# Patient Record
Sex: Female | Born: 1945 | Race: Black or African American | Hispanic: No | Smoking: Former smoker
Health system: Southern US, Community
[De-identification: ages and names within clinical notes are randomized; demographics above are authoritative.]

## PROBLEM LIST (undated history)

## (undated) DIAGNOSIS — I509 Heart failure, unspecified: Secondary | ICD-10-CM

## (undated) DIAGNOSIS — I739 Peripheral vascular disease, unspecified: Secondary | ICD-10-CM

## (undated) DIAGNOSIS — I5030 Unspecified diastolic (congestive) heart failure: Secondary | ICD-10-CM

## (undated) DIAGNOSIS — I160 Hypertensive urgency: Secondary | ICD-10-CM

## (undated) DIAGNOSIS — I10 Essential (primary) hypertension: Secondary | ICD-10-CM

## (undated) DIAGNOSIS — R569 Unspecified convulsions: Secondary | ICD-10-CM

## (undated) DIAGNOSIS — K219 Gastro-esophageal reflux disease without esophagitis: Secondary | ICD-10-CM

## (undated) DIAGNOSIS — G8929 Other chronic pain: Secondary | ICD-10-CM

## (undated) DIAGNOSIS — F32A Depression, unspecified: Secondary | ICD-10-CM

## (undated) DIAGNOSIS — R079 Chest pain, unspecified: Secondary | ICD-10-CM

## (undated) DIAGNOSIS — E785 Hyperlipidemia, unspecified: Secondary | ICD-10-CM

## (undated) DIAGNOSIS — I25118 Atherosclerotic heart disease of native coronary artery with other forms of angina pectoris: Secondary | ICD-10-CM

## (undated) DIAGNOSIS — I5032 Chronic diastolic (congestive) heart failure: Secondary | ICD-10-CM

## (undated) DIAGNOSIS — I2510 Atherosclerotic heart disease of native coronary artery without angina pectoris: Secondary | ICD-10-CM

## (undated) DIAGNOSIS — E119 Type 2 diabetes mellitus without complications: Secondary | ICD-10-CM

## (undated) DIAGNOSIS — F329 Major depressive disorder, single episode, unspecified: Secondary | ICD-10-CM

## (undated) DIAGNOSIS — R002 Palpitations: Secondary | ICD-10-CM

## (undated) DIAGNOSIS — M1990 Unspecified osteoarthritis, unspecified site: Secondary | ICD-10-CM

## (undated) DIAGNOSIS — I219 Acute myocardial infarction, unspecified: Secondary | ICD-10-CM

## (undated) DIAGNOSIS — I5031 Acute diastolic (congestive) heart failure: Secondary | ICD-10-CM

## (undated) DIAGNOSIS — F419 Anxiety disorder, unspecified: Secondary | ICD-10-CM

## (undated) DIAGNOSIS — J449 Chronic obstructive pulmonary disease, unspecified: Secondary | ICD-10-CM

## (undated) DIAGNOSIS — M549 Dorsalgia, unspecified: Secondary | ICD-10-CM

## (undated) HISTORY — DX: Chronic diastolic (congestive) heart failure: I50.32

## (undated) HISTORY — DX: Palpitations: R00.2

## (undated) HISTORY — DX: Essential (primary) hypertension: I10

## (undated) HISTORY — PX: KNEE SURGERY: SHX244

## (undated) HISTORY — DX: Unspecified convulsions: R56.9

## (undated) HISTORY — PX: FRACTURE SURGERY: SHX138

## (undated) HISTORY — PX: ANKLE SURGERY: SHX546

## (undated) HISTORY — PX: CORONARY ANGIOPLASTY: SHX604

## (undated) HISTORY — DX: Hypertensive urgency: I16.0

## (undated) HISTORY — PX: CATARACT EXTRACTION W/ INTRAOCULAR LENS  IMPLANT, BILATERAL: SHX1307

## (undated) HISTORY — DX: Peripheral vascular disease, unspecified: I73.9

## (undated) HISTORY — PX: ABDOMINAL HYSTERECTOMY: SHX81

## (undated) HISTORY — DX: Atherosclerotic heart disease of native coronary artery with other forms of angina pectoris: I25.118

## (undated) HISTORY — DX: Chest pain, unspecified: R07.9

## (undated) HISTORY — DX: Acute diastolic (congestive) heart failure: I50.31

## (undated) HISTORY — PX: CORONARY ARTERY BYPASS GRAFT: SHX141

## (undated) HISTORY — DX: Hyperlipidemia, unspecified: E78.5

## (undated) HISTORY — PX: JOINT REPLACEMENT: SHX530

## (undated) HISTORY — DX: Acute myocardial infarction, unspecified: I21.9

---

## 2015-04-14 ENCOUNTER — Encounter: Payer: Self-pay | Admitting: Emergency Medicine

## 2015-04-14 ENCOUNTER — Inpatient Hospital Stay
Admission: EM | Admit: 2015-04-14 | Discharge: 2015-04-15 | DRG: 192 | Disposition: A | Discharge status: Home / Self Care | Payer: Medicare Other | Attending: Internal Medicine | Admitting: Internal Medicine

## 2015-04-14 ENCOUNTER — Emergency Department: Payer: Medicare Other

## 2015-04-14 DIAGNOSIS — G8929 Other chronic pain: Secondary | ICD-10-CM | POA: Diagnosis present

## 2015-04-14 DIAGNOSIS — Z794 Long term (current) use of insulin: Secondary | ICD-10-CM | POA: Diagnosis not present

## 2015-04-14 DIAGNOSIS — G40909 Epilepsy, unspecified, not intractable, without status epilepticus: Secondary | ICD-10-CM | POA: Diagnosis present

## 2015-04-14 DIAGNOSIS — Z951 Presence of aortocoronary bypass graft: Secondary | ICD-10-CM | POA: Diagnosis not present

## 2015-04-14 DIAGNOSIS — I110 Hypertensive heart disease with heart failure: Secondary | ICD-10-CM | POA: Diagnosis present

## 2015-04-14 DIAGNOSIS — E119 Type 2 diabetes mellitus without complications: Secondary | ICD-10-CM | POA: Diagnosis present

## 2015-04-14 DIAGNOSIS — R300 Dysuria: Secondary | ICD-10-CM | POA: Diagnosis present

## 2015-04-14 DIAGNOSIS — R32 Unspecified urinary incontinence: Secondary | ICD-10-CM | POA: Diagnosis present

## 2015-04-14 DIAGNOSIS — J441 Chronic obstructive pulmonary disease with (acute) exacerbation: Secondary | ICD-10-CM | POA: Diagnosis present

## 2015-04-14 DIAGNOSIS — M549 Dorsalgia, unspecified: Secondary | ICD-10-CM | POA: Diagnosis present

## 2015-04-14 DIAGNOSIS — Z87891 Personal history of nicotine dependence: Secondary | ICD-10-CM

## 2015-04-14 DIAGNOSIS — I509 Heart failure, unspecified: Secondary | ICD-10-CM | POA: Diagnosis present

## 2015-04-14 DIAGNOSIS — F419 Anxiety disorder, unspecified: Secondary | ICD-10-CM | POA: Diagnosis present

## 2015-04-14 DIAGNOSIS — Z23 Encounter for immunization: Secondary | ICD-10-CM

## 2015-04-14 DIAGNOSIS — F329 Major depressive disorder, single episode, unspecified: Secondary | ICD-10-CM | POA: Diagnosis present

## 2015-04-14 DIAGNOSIS — R350 Frequency of micturition: Secondary | ICD-10-CM | POA: Diagnosis present

## 2015-04-14 HISTORY — DX: Type 2 diabetes mellitus without complications: E11.9

## 2015-04-14 HISTORY — DX: Anxiety disorder, unspecified: F41.9

## 2015-04-14 HISTORY — DX: Chronic obstructive pulmonary disease, unspecified: J44.9

## 2015-04-14 HISTORY — DX: Major depressive disorder, single episode, unspecified: F32.9

## 2015-04-14 HISTORY — DX: Essential (primary) hypertension: I10

## 2015-04-14 HISTORY — DX: Heart failure, unspecified: I50.9

## 2015-04-14 HISTORY — DX: Atherosclerotic heart disease of native coronary artery without angina pectoris: I25.10

## 2015-04-14 HISTORY — DX: Dorsalgia, unspecified: M54.9

## 2015-04-14 HISTORY — DX: Other chronic pain: G89.29

## 2015-04-14 HISTORY — DX: Unspecified convulsions: R56.9

## 2015-04-14 HISTORY — DX: Depression, unspecified: F32.A

## 2015-04-14 LAB — COMPREHENSIVE METABOLIC PANEL
ALK PHOS: 123 U/L (ref 38–126)
ALT: 11 U/L — ABNORMAL LOW (ref 14–54)
ANION GAP: 9 (ref 5–15)
AST: 17 U/L (ref 15–41)
Albumin: 3.8 g/dL (ref 3.5–5.0)
BUN: 15 mg/dL (ref 6–20)
CHLORIDE: 104 mmol/L (ref 101–111)
CO2: 24 mmol/L (ref 22–32)
Calcium: 8.8 mg/dL — ABNORMAL LOW (ref 8.9–10.3)
Creatinine, Ser: 0.88 mg/dL (ref 0.44–1.00)
GFR calc Af Amer: >60 mL/min (ref >60)
GFR calc non Af Amer: >60 mL/min (ref >60)
GLUCOSE: 210 mg/dL — AB (ref 65–99)
POTASSIUM: 5.4 mmol/L — AB (ref 3.5–5.1)
Sodium: 137 mmol/L (ref 135–145)
Total Bilirubin: 0.8 mg/dL (ref 0.3–1.2)
Total Protein: 7.5 g/dL (ref 6.5–8.1)

## 2015-04-14 LAB — CBC WITH DIFFERENTIAL/PLATELET
Basophils Absolute: 0 10*3/uL (ref 0–0.1)
Basophils Relative: 1 %
Eosinophils Absolute: 0.1 10*3/uL (ref 0–0.7)
Eosinophils Relative: 2 %
HEMATOCRIT: 35.5 % (ref 35.0–47.0)
HEMOGLOBIN: 11.3 g/dL — AB (ref 12.0–16.0)
LYMPHS ABS: 1.1 10*3/uL (ref 1.0–3.6)
LYMPHS PCT: 13 %
MCH: 26.9 pg (ref 26.0–34.0)
MCHC: 31.8 g/dL — AB (ref 32.0–36.0)
MCV: 84.5 fL (ref 80.0–100.0)
MONOS PCT: 4 %
Monocytes Absolute: 0.3 10*3/uL (ref 0.2–0.9)
NEUTROS ABS: 6.6 10*3/uL — AB (ref 1.4–6.5)
NEUTROS PCT: 80 %
Platelets: 205 10*3/uL (ref 150–440)
RBC: 4.2 MIL/uL (ref 3.80–5.20)
RDW: 15.6 % — ABNORMAL HIGH (ref 11.5–14.5)
WBC: 8.2 10*3/uL (ref 3.6–11.0)

## 2015-04-14 LAB — URINALYSIS COMPLETE WITH MICROSCOPIC (ARMC ONLY)
BACTERIA UA: NONE SEEN
Bilirubin Urine: NEGATIVE
Glucose, UA: 50 mg/dL — AB
Ketones, ur: NEGATIVE mg/dL
Leukocytes, UA: NEGATIVE
Nitrite: NEGATIVE
PH: 6 (ref 5.0–8.0)
PROTEIN: NEGATIVE mg/dL
SPECIFIC GRAVITY, URINE: 1.021 (ref 1.005–1.030)

## 2015-04-14 LAB — GLUCOSE, CAPILLARY
Glucose-Capillary: 197 mg/dL — ABNORMAL HIGH (ref 65–99)
Glucose-Capillary: 298 mg/dL — ABNORMAL HIGH (ref 65–99)

## 2015-04-14 LAB — TROPONIN I
Troponin I: 0.05 ng/mL — ABNORMAL HIGH (ref <0.031)
Troponin I: <0.03 ng/mL (ref <0.031)

## 2015-04-14 MED ORDER — HYDRALAZINE HCL 25 MG PO TABS
25.0000 mg | ORAL_TABLET | Freq: Two times a day (BID) | ORAL | Status: DC
  Administered 2015-04-14 – 2015-04-15 (×2): 25 mg via ORAL
  Filled 2015-04-14 (×2): qty 1

## 2015-04-14 MED ORDER — AZITHROMYCIN 250 MG PO TABS
250.0000 mg | ORAL_TABLET | Freq: Every day | ORAL | Status: DC
  Administered 2015-04-15: 250 mg via ORAL
  Filled 2015-04-14: qty 1

## 2015-04-14 MED ORDER — IPRATROPIUM-ALBUTEROL 0.5-2.5 (3) MG/3ML IN SOLN
3.0000 mL | Freq: Once | RESPIRATORY_TRACT | Status: AC
  Administered 2015-04-14: 3 mL via RESPIRATORY_TRACT
  Filled 2015-04-14: qty 3

## 2015-04-14 MED ORDER — ACETAMINOPHEN 650 MG RE SUPP
650.0000 mg | Freq: Four times a day (QID) | RECTAL | Status: DC | PRN
Start: 2015-04-14 — End: 2015-04-15

## 2015-04-14 MED ORDER — PANTOPRAZOLE SODIUM 40 MG PO TBEC
40.0000 mg | DELAYED_RELEASE_TABLET | Freq: Every day | ORAL | Status: DC
  Administered 2015-04-14 – 2015-04-15 (×2): 40 mg via ORAL
  Filled 2015-04-14 (×2): qty 1

## 2015-04-14 MED ORDER — SODIUM CHLORIDE 0.9 % IJ SOLN
3.0000 mL | Freq: Two times a day (BID) | INTRAMUSCULAR | Status: DC
  Administered 2015-04-14 – 2015-04-15 (×3): 3 mL via INTRAVENOUS

## 2015-04-14 MED ORDER — CLONIDINE HCL 0.1 MG PO TABS
0.3000 mg | ORAL_TABLET | Freq: Two times a day (BID) | ORAL | Status: DC
  Administered 2015-04-14 – 2015-04-15 (×2): 0.3 mg via ORAL
  Filled 2015-04-14 (×2): qty 3

## 2015-04-14 MED ORDER — INSULIN ASPART 100 UNIT/ML ~~LOC~~ SOLN
0.0000 [IU] | Freq: Three times a day (TID) | SUBCUTANEOUS | Status: DC
  Administered 2015-04-14 – 2015-04-15 (×2): 2 [IU] via SUBCUTANEOUS
  Administered 2015-04-15 (×2): 7 [IU] via SUBCUTANEOUS
  Filled 2015-04-14 (×2): qty 7
  Filled 2015-04-14 (×2): qty 2

## 2015-04-14 MED ORDER — OXYCODONE-ACETAMINOPHEN 5-325 MG PO TABS
1.0000 | ORAL_TABLET | Freq: Four times a day (QID) | ORAL | Status: DC | PRN
Start: 2015-04-14 — End: 2015-04-15
  Administered 2015-04-14 – 2015-04-15 (×2): 1 via ORAL
  Filled 2015-04-14 (×2): qty 1

## 2015-04-14 MED ORDER — MOMETASONE FURO-FORMOTEROL FUM 100-5 MCG/ACT IN AERO
2.0000 | INHALATION_SPRAY | Freq: Two times a day (BID) | RESPIRATORY_TRACT | Status: DC
  Administered 2015-04-14 – 2015-04-15 (×2): 2 via RESPIRATORY_TRACT
  Filled 2015-04-14: qty 8.8

## 2015-04-14 MED ORDER — ENOXAPARIN SODIUM 40 MG/0.4ML ~~LOC~~ SOLN
40.0000 mg | SUBCUTANEOUS | Status: DC
  Administered 2015-04-14: 21:00:00 40 mg via SUBCUTANEOUS
  Filled 2015-04-14 (×2): qty 0.4

## 2015-04-14 MED ORDER — SODIUM CHLORIDE 0.9 % IV BOLUS (SEPSIS)
1000.0000 mL | Freq: Once | INTRAVENOUS | Status: AC
  Administered 2015-04-14: 1000 mL via INTRAVENOUS

## 2015-04-14 MED ORDER — ONDANSETRON HCL 4 MG PO TABS: 4.0000 mg | ORAL_TABLET | Freq: Four times a day (QID) | ORAL | Status: DC | PRN

## 2015-04-14 MED ORDER — IPRATROPIUM BROMIDE 0.02 % IN SOLN: 0.5000 mg | Freq: Four times a day (QID) | RESPIRATORY_TRACT | Status: DC

## 2015-04-14 MED ORDER — HYDROCODONE-ACETAMINOPHEN 5-325 MG PO TABS: 1.0000 | ORAL_TABLET | ORAL | Status: DC | PRN

## 2015-04-14 MED ORDER — LISINOPRIL 10 MG PO TABS
10.0000 mg | ORAL_TABLET | Freq: Every day | ORAL | Status: DC
  Administered 2015-04-15: 10 mg via ORAL
  Filled 2015-04-14: qty 1

## 2015-04-14 MED ORDER — IPRATROPIUM-ALBUTEROL 0.5-2.5 (3) MG/3ML IN SOLN
3.0000 mL | Freq: Four times a day (QID) | RESPIRATORY_TRACT | Status: DC
  Administered 2015-04-14 – 2015-04-15 (×4): 3 mL via RESPIRATORY_TRACT
  Filled 2015-04-14 (×4): qty 3

## 2015-04-14 MED ORDER — GUAIFENESIN-DM 100-10 MG/5ML PO SYRP
5.0000 mL | ORAL_SOLUTION | ORAL | Status: DC | PRN
  Administered 2015-04-14: 5 mL via ORAL
  Filled 2015-04-14: qty 5

## 2015-04-14 MED ORDER — SODIUM CHLORIDE 0.9 % IJ SOLN: 3.0000 mL | INTRAMUSCULAR | Status: DC | PRN

## 2015-04-14 MED ORDER — IMIPRAMINE HCL 25 MG PO TABS
200.0000 mg | ORAL_TABLET | Freq: Every day | ORAL | Status: DC
  Administered 2015-04-14: 21:00:00 200 mg via ORAL
  Filled 2015-04-14: qty 4
  Filled 2015-04-14: qty 8

## 2015-04-14 MED ORDER — OXYCODONE HCL ER 40 MG PO T12A
60.0000 mg | EXTENDED_RELEASE_TABLET | Freq: Two times a day (BID) | ORAL | Status: DC
  Administered 2015-04-14: 60 mg via ORAL
  Filled 2015-04-14 (×2): qty 1

## 2015-04-14 MED ORDER — CARVEDILOL 25 MG PO TABS
50.0000 mg | ORAL_TABLET | Freq: Two times a day (BID) | ORAL | Status: DC
  Administered 2015-04-14 – 2015-04-15 (×2): 50 mg via ORAL
  Filled 2015-04-14 (×2): qty 2

## 2015-04-14 MED ORDER — TIOTROPIUM BROMIDE MONOHYDRATE 18 MCG IN CAPS: 18.0000 ug | ORAL_CAPSULE | Freq: Every day | RESPIRATORY_TRACT | Status: DC

## 2015-04-14 MED ORDER — SODIUM CHLORIDE 0.9 % IJ SOLN
3.0000 mL | Freq: Two times a day (BID) | INTRAMUSCULAR | Status: DC
  Administered 2015-04-14 – 2015-04-15 (×2): 3 mL via INTRAVENOUS

## 2015-04-14 MED ORDER — TIOTROPIUM BROMIDE MONOHYDRATE 18 MCG IN CAPS
18.0000 ug | ORAL_CAPSULE | Freq: Every day | RESPIRATORY_TRACT | Status: DC
  Administered 2015-04-15: 18 ug via RESPIRATORY_TRACT
  Filled 2015-04-14: qty 5

## 2015-04-14 MED ORDER — PNEUMOCOCCAL VAC POLYVALENT 25 MCG/0.5ML IJ INJ
0.5000 mL | INJECTION | INTRAMUSCULAR | Status: AC
  Administered 2015-04-15: 0.5 mL via INTRAMUSCULAR
  Filled 2015-04-14: qty 0.5

## 2015-04-14 MED ORDER — INSULIN GLARGINE 100 UNIT/ML ~~LOC~~ SOLN
15.0000 [IU] | SUBCUTANEOUS | Status: DC
  Administered 2015-04-15: 15 [IU] via SUBCUTANEOUS
  Filled 2015-04-14: qty 0.15

## 2015-04-14 MED ORDER — ALBUTEROL SULFATE (2.5 MG/3ML) 0.083% IN NEBU: 2.5000 mg | INHALATION_SOLUTION | Freq: Four times a day (QID) | RESPIRATORY_TRACT | Status: DC

## 2015-04-14 MED ORDER — LOSARTAN POTASSIUM 50 MG PO TABS
100.0000 mg | ORAL_TABLET | Freq: Every day | ORAL | Status: DC
  Administered 2015-04-14 – 2015-04-15 (×2): 100 mg via ORAL
  Filled 2015-04-14: qty 4
  Filled 2015-04-14: qty 2

## 2015-04-14 MED ORDER — MELOXICAM 7.5 MG PO TABS
7.5000 mg | ORAL_TABLET | Freq: Every day | ORAL | Status: DC
  Administered 2015-04-15: 7.5 mg via ORAL
  Filled 2015-04-14: qty 1

## 2015-04-14 MED ORDER — LISINOPRIL 10 MG PO TABS: 10.0000 mg | ORAL_TABLET | Freq: Every day | ORAL | Status: DC

## 2015-04-14 MED ORDER — SODIUM CHLORIDE 0.9 % IV SOLN: 250.0000 mL | INTRAVENOUS | Status: DC | PRN

## 2015-04-14 MED ORDER — INSULIN ASPART 100 UNIT/ML ~~LOC~~ SOLN
10.0000 [IU] | Freq: Three times a day (TID) | SUBCUTANEOUS | Status: DC
  Administered 2015-04-14 – 2015-04-15 (×4): 10 [IU] via SUBCUTANEOUS
  Filled 2015-04-14 (×4): qty 10

## 2015-04-14 MED ORDER — OXYCODONE HCL ER 60 MG PO T12A: 60.0000 mg | EXTENDED_RELEASE_TABLET | Freq: Two times a day (BID) | ORAL | Status: DC

## 2015-04-14 MED ORDER — METHYLPREDNISOLONE SODIUM SUCC 125 MG IJ SOLR
60.0000 mg | Freq: Two times a day (BID) | INTRAMUSCULAR | Status: DC
  Administered 2015-04-15 (×2): 60 mg via INTRAVENOUS
  Filled 2015-04-14 (×2): qty 2

## 2015-04-14 MED ORDER — AZITHROMYCIN 250 MG PO TABS
500.0000 mg | ORAL_TABLET | Freq: Every day | ORAL | Status: AC
  Administered 2015-04-14: 500 mg via ORAL
  Filled 2015-04-14: qty 2

## 2015-04-14 MED ORDER — LEVETIRACETAM 750 MG PO TABS
750.0000 mg | ORAL_TABLET | Freq: Two times a day (BID) | ORAL | Status: DC
  Administered 2015-04-14 – 2015-04-15 (×2): 750 mg via ORAL
  Filled 2015-04-14 (×2): qty 1

## 2015-04-14 MED ORDER — SODIUM CHLORIDE 0.9 % IJ SOLN
3.0000 mL | INTRAMUSCULAR | Status: DC | PRN
  Administered 2015-04-15: 3 mL via INTRAVENOUS
  Filled 2015-04-14: qty 10

## 2015-04-14 MED ORDER — ACETAMINOPHEN 325 MG PO TABS
650.0000 mg | ORAL_TABLET | Freq: Four times a day (QID) | ORAL | Status: DC | PRN
Start: 2015-04-14 — End: 2015-04-15
  Administered 2015-04-14: 650 mg via ORAL
  Filled 2015-04-14: qty 2

## 2015-04-14 MED ORDER — ALPRAZOLAM 0.5 MG PO TABS: 0.5000 mg | ORAL_TABLET | Freq: Three times a day (TID) | ORAL | Status: DC | PRN

## 2015-04-14 MED ORDER — METHYLPREDNISOLONE SODIUM SUCC 125 MG IJ SOLR
125.0000 mg | Freq: Once | INTRAMUSCULAR | Status: AC
  Administered 2015-04-14: 125 mg via INTRAVENOUS
  Filled 2015-04-14: qty 2

## 2015-04-14 MED ORDER — ONDANSETRON HCL 4 MG/2ML IJ SOLN: 4.0000 mg | Freq: Four times a day (QID) | INTRAMUSCULAR | Status: DC | PRN

## 2015-04-14 NOTE — ED Notes (Signed)
Patient from home VIA [facility] with c/o cough with congestion, decreased PO intake, incontinence with foul smelling urine, SPO2 on scene was 89 RA. Patient states her CBG was 27 however, EMS CBG resulted 170.

## 2015-04-14 NOTE — H&P (Addendum)
[facility] [place] NAME: Tina Hayes    MR#:  [id number]  DATE OF BIRTH:  30-Dec-1945  DATE OF ADMISSION:  04/14/2015  PRIMARY CARE PHYSICIAN: None was seeing a physician in [place] but more from there 3 months ago and has not seen any primary care provider  REQUESTING/REFERRING PHYSICIAN:   CHIEF COMPLAINT:   Chief Complaint  Patient presents with  . Respiratory Distress  . Urinary Frequency  . Diabetes  . Cough    HISTORY OF PRESENT ILLNESS: Tina Hayes  is a 69 y.o. female with a known history of  COPD, coronary artery disease, CHF, diabetes, hypertension, seizures, chronic back pain who presents to the emergency room complaining of shortness of breath and cough for the past 2 days. According to the ED physician when she initially arrived her sats were in the 87's. She was wheezing. Patient reports that her shortness of breath started 2 days ago. She has had a dry cough. And has wheezing. Has not had any fevers or chills. Denies any chest pains or palpitations. Complains of nausea. She reports that she has not used her inhalers in the past few months. Also has a nebulizer at home as well. She has some of her medications that she still has but is not taking some of the other ones.      PAST MEDICAL HISTORY:   Past Medical History  Diagnosis Date  . COPD (chronic obstructive pulmonary disease) ([place])   . Coronary artery disease   . CHF (congestive heart failure) ([place])   . Diabetes mellitus without complication ([place])   . Hypertension   . Seizures ([place])   . Chronic back pain   . Depression   . Anxiety     PAST SURGICAL HISTORY:  Past Surgical History  Procedure Laterality Date  . Coronary artery bypass graft    . Knee surgery    . Ankle surgery      SOCIAL HISTORY:  Social History  Substance Use Topics  . Smoking status: Former Research scientist (life sciences)  . Smokeless tobacco: Not on file  . Alcohol Use: No    FAMILY HISTORY:   Family History  Problem Relation Age of Onset  . Diabetes    . Hypertension      DRUG ALLERGIES:  Allergies  Allergen Reactions  . Aspirin Anaphylaxis    REVIEW OF SYSTEMS:   CONSTITUTIONAL: No fever, positive fatigue and weakness.  EYES: No blurred or double vision.  EARS, NOSE, AND THROAT: No tinnitus or ear pain.  RESPIRATORY: Positive cough and shortness of breath, positive wheezing no hemoptysis.  CARDIOVASCULAR: No chest pain, orthopnea, edema.  GASTROINTESTINAL: No nausea, vomiting, diarrhea or abdominal pain.  GENITOURINARY: No dysuria, hematuria.  ENDOCRINE: No polyuria, nocturia,  HEMATOLOGY: No anemia, easy bruising or bleeding SKIN: No rash or lesion. MUSCULOSKELETAL: Left knee joint pain or arthritis. Chronic back pain  NEUROLOGIC: No tingling, numbness, weakness.  PSYCHIATRY: No anxiety or depression.       Medication List [facility]    ASK your doctor about these medications        ALPRAZolam 0.5 MG tablet  Commonly known as:  XANAX  Take 0.5 mg by mouth 3 (three) times daily as needed for anxiety or sleep.     carvedilol 25 MG tablet  Commonly known as:  COREG  Take 50 mg by mouth 2 (two) times daily.     cloNIDine 0.1 MG tablet  Commonly known as:  CATAPRES  Take 0.3 mg by mouth 2 (two) times daily.     hydrALAZINE 25 MG tablet  Commonly known as:  APRESOLINE  Take 25 mg by mouth 2 (two) times daily.     imipramine 50 MG tablet  Commonly known as:  TOFRANIL  Take 200 mg by mouth daily.     insulin aspart 100 UNIT/ML injection  Commonly known as:  novoLOG  Inject 10 Units into the skin 3 (three) times daily before meals.     insulin glargine 100 UNIT/ML injection  Commonly known as:  LANTUS  Inject 15 Units into the skin every morning.     levETIRAcetam 750 MG tablet  Commonly known as:  KEPPRA  Take 750 mg by mouth 2 (two) times daily.     lisinopril 10 MG tablet  Commonly known as:  PRINIVIL,ZESTRIL  Take 10 mg by mouth daily.      losartan 100 MG tablet  Commonly known as:  COZAAR  Take 100 mg by mouth daily.     meloxicam 7.5 MG tablet  Commonly known as:  MOBIC  Take 7.5 mg by mouth daily.     omeprazole 40 MG capsule  Commonly known as:  PRILOSEC  Take 40 mg by mouth 2 (two) times daily.     oxyCODONE-acetaminophen 5-325 MG tablet  Commonly known as:  PERCOCET/ROXICET  Take 1-2 tablets by mouth every 6 (six) hours as needed for moderate pain or severe pain.     OXYCONTIN 60 MG 12 hr tablet  Generic drug:  oxyCODONE  Take 60 mg by mouth every 12 (twelve) hours.     tiotropium 18 MCG inhalation capsule  Commonly known as:  SPIRIVA  Place 18 mcg into inhaler and inhale daily.          PHYSICAL EXAMINATION:   VITAL SIGNS: Blood pressure 158/74, pulse 74, temperature 98.2 F (36.8 C), temperature source Oral, resp. rate 21, height 5\' 11"  (1.803 m), weight 139.935 kg (308 lb 8 oz), SpO2 100 %.  GENERAL:  69 y.o.-year-old patient lying in the bed with no acute distress.  EYES: Pupils equal, round, reactive to light and accommodation. No scleral icterus. Extraocular muscles intact.  HEENT: Head atraumatic, normocephalic. Oropharynx and nasopharynx clear.  NECK:  Supple, no jugular venous distention. No thyroid enlargement, no tenderness.  LUNGS: Occasional wheezing in both lungs there is no rhonchi or crackles. CARDIOVASCULAR: S1, S2 normal. No murmurs, rubs, or gallops.  ABDOMEN: Soft, nontender, nondistended. Bowel sounds present. No organomegaly or mass.  EXTREMITIES: No pedal edema, cyanosis, or clubbing.  NEUROLOGIC: Cranial nerves II through XII are intact. Muscle strength 5/5 in all extremities. Sensation intact. Gait not checked.  PSYCHIATRIC: The patient is alert and oriented x 3.  SKIN: No obvious rash, lesion, or ulcer.   LABORATORY PANEL:   CBC  Recent Labs Lab 04/14/15 1105  WBC 8.2  HGB 11.3*  HCT 35.5  PLT 205  MCV 84.5  MCH 26.9  MCHC 31.8*  RDW 15.6*  LYMPHSABS 1.1   MONOABS 0.3  EOSABS 0.1  BASOSABS 0.0   ------------------------------------------------------------------------------------------------------------------  Chemistries   Recent Labs Lab 04/14/15 1105  NA 137  K 5.4*  CL 104  CO2 24  GLUCOSE 210*  BUN 15  CREATININE 0.88  CALCIUM 8.8*  AST 17  ALT 11*  ALKPHOS 123  BILITOT 0.8   ------------------------------------------------------------------------------------------------------------------ estimated creatinine clearance is 96.4 mL/min (by C-G formula based on Cr of 0.88). ------------------------------------------------------------------------------------------------------------------ No results for input(s): TSH, T4TOTAL, T3FREE,  THYROIDAB in the last 72 hours.  Invalid input(s): FREET3   Coagulation profile No results for input(s): INR, PROTIME in the last 168 hours. ------------------------------------------------------------------------------------------------------------------- No results for input(s): DDIMER in the last 72 hours. -------------------------------------------------------------------------------------------------------------------  Cardiac Enzymes  Recent Labs Lab 04/14/15 1105  TROPONINI 0.05*   ------------------------------------------------------------------------------------------------------------------ Invalid input(s): POCBNP  ---------------------------------------------------------------------------------------------------------------  Urinalysis No results found for: COLORURINE, APPEARANCEUR, LABSPEC, PHURINE, GLUCOSEU, HGBUR, BILIRUBINUR, KETONESUR, PROTEINUR, UROBILINOGEN, NITRITE, LEUKOCYTESUR   RADIOLOGY: Dg Chest Portable 1 View  04/14/2015  CLINICAL DATA:  Cough and congestion EXAM: PORTABLE CHEST - 1 VIEW COMPARISON:  None. FINDINGS: Cardiac shadow is within normal limits. Postsurgical changes are noted. The lungs are well aerated bilaterally. No focal infiltrate is  seen. No acute bony abnormality is noted. IMPRESSION: No acute abnormality noted Electronically Signed   By: Inez Catalina M.D.   On: 04/14/2015 12:09    EKG: Orders placed or performed during the hospital encounter of 04/14/15  . EKG 12-Lead  . EKG 12-Lead    IMPRESSION AND PLAN:  Patient is a 69 year old African-American female with history of COPD presents with shortness of breath cough  1. Acute on chronic COPD exasperation: I will place her on nebulizers every 6 hours. Place her on IV Solu-Medrol twice a day. Continue home inhalers. I will also start her on Spiriva and Dulera  2. Diabetes type 2: Blood sugar elevated check a hemoglobin A1c continue Lantus and NovoLog pre-meal may need adjustment of her insulin  3. Hypertension continue him clonidine and losartan, she is also supposed to be on lisinopril which I will hold due to her being on losartan.   4. Coronary artery disease with history of CABG unable to take aspirin continue carvedilol  5. Anxiety continue alprazolam as previously taking  6. Chronic back pain supposed to be on OxyContin twice a day which she has ran out. I will resume  7. History of seizure disorder continue Keppra  8. Miscellaneous heparin for DVT prophylaxis  Code full    All the records are reviewed and case discussed with ED provider. Management plans discussed with the patient, family and they are in agreement.  CODE STATUS: Full    TOTAL TIME TAKING CARE OF THIS PATIENT: 55 minutes.    Dustin Flock M.D on 04/14/2015 at 3:56 PM  Between 7am to 6pm - Pager - [phone]  After 6pm go to [email or web address] - password [email or web address] [facility]  [facility]  Office  [phone]  CC: Primary care physician; No primary care provider on file.

## 2015-04-14 NOTE — ED Provider Notes (Signed)
[facility] Emergency Department Provider Note  Time seen: 11:46 AM  I have reviewed the triage vital signs and the nursing notes.   HISTORY  Chief Complaint Respiratory Distress; Urinary Frequency; Diabetes; and Cough    HPI Tina Hayes is a 69 y.o. female with a past medical history of COPD, CHF, diabetes, hypertension, seizure disorder presents the emergency department for trouble breathing and incontinence. According to the patient she has a long history of COPD, she is supposed to be on medications for the same including nebulizer treatments, and inhalers but admits she has been out of these medications for up to 3 months. Patient also presents with incontinence which she states is a long-standing issue approximately 5 years or more. She states over the past 3 days incontinence has been worse with some dysuria as well. Patient's main complaint is of back pain which she states is chronic, states she is prescribed OxyContin as well as oxycodone but ran out of both of these medications when she moved to the area, and is asking for me to refill these medicines for her. Patient states she has no primary care doctor in the area but plans to follow up with [facility] clinic.     Past Medical History  Diagnosis Date  . COPD (chronic obstructive pulmonary disease) ([place])   . Coronary artery disease   . CHF (congestive heart failure) ([place])   . Diabetes mellitus without complication ([place])   . Hypertension   . Seizures ([place])     There are no active problems to display for this patient.   Past Surgical History  Procedure Laterality Date  . Coronary artery bypass graft      No current outpatient prescriptions on file.  Allergies Review of patient's allergies indicates not on file.  No family history on file.  Social History Social History  Substance Use Topics  . Smoking status: Not on file  . Smokeless tobacco: Not on file  . Alcohol Use: No    Review  of Systems Constitutional: Negative for fever. ENT: States mild congestion Cardiovascular: Negative for chest pain. Respiratory: Other shortness of breath, although the patient states this is largely her baseline. Does not wear O2 at home. Gastrointestinal: Mild suprapubic abdominal pain. Denies nausea, vomiting, diarrhea. Genitourinary: Positive for dysuria, positive for urinary frequency, positive for incontinence which appears to be a long-standing issue per patient. Musculoskeletal: Positive for back pain which she states is chronic, denies any recent traumatic events. Neurological: Negative for headache 10-point ROS otherwise negative.  ____________________________________________   PHYSICAL EXAM:  VITAL SIGNS: ED Triage Vitals  Enc Vitals Group     BP 04/14/15 1132 124/77 mmHg     Pulse Rate 04/14/15 1132 66     Resp 04/14/15 1132 21     Temp 04/14/15 1132 98.2 F (36.8 C)     Temp Source 04/14/15 1132 Oral     SpO2 04/14/15 1132 99 %     Weight 04/14/15 1132 308 lb 8 oz (139.935 kg)     Height 04/14/15 1132 5\' 11"  (1.803 m)     Head Cir --      Peak Flow --      Pain Score --      Pain Loc --      Pain Edu? --      Excl. in [place]? --     Constitutional: Alert and oriented. Well appearing and in no distress. Eyes: Normal exam ENT   Head: Normocephalic and atraumatic.  Mouth/Throat: Mucous membranes are moist. Cardiovascular: Normal rate, regular rhythm. No murmur Respiratory: Normal respiratory effort without tachypnea nor retractions. Slight expiratory wheeze bilaterally. No rales or rhonchi. Gastrointestinal: Soft and nontender. No distention.   Musculoskeletal: Nontender with normal range of motion in all extremities Neurologic:  Normal speech and language. No gross focal neurologic deficits Skin:  Skin is warm, dry and intact.  Psychiatric: Mood and affect are normal. Speech and behavior are normal.   ____________________________________________     EKG  EKG reviewed and interpreted by myself shows normal sinus rhythm at 70 bpm, narrow QRS, left axis deviation, nonspecific ST changes present. No ST elevations.  ____________________________________________    RADIOLOGY  Chest x-ray shows no acute abnormality.  ____________________________________________    INITIAL IMPRESSION / ASSESSMENT AND PLAN / ED COURSE  Pertinent labs & imaging results that were available during my care of the patient were reviewed by me and considered in my medical decision making (see chart for details).  Patient presents the emergency department with various complaints of trouble breathing which she states is chronic but somewhat worse over the last few days, urinary incontinence which she states is chronic but somewhat worse and last few days now associated with mild dysuria as well, and back pain which she states is chronic but is out of her OxyContin and oxycodone. Patient currently has a minimal expiratory wheeze on exam we will treat with DuoNeb, obtain a chest x-ray as well as labs and closely monitor in the emergency department. As far as the patient's urinary incontinence she states this is a long-standing issue greater 5 years however worse over the past 3 days associated with mild dysuria as well. We'll check a urinalysis. I discussed with the patient that we are unable to refill her controlled medications such as OxyContin and oxycodone, the patient is understandable.  Chest x-ray negative. Patient does have an elevated troponin 0.05, no old troponin to compare. Patient initially with 89% room air saturation on arrival, mild expiratory wheezes bilaterally. Dose with DuoNeb's Solu-Medrol, continues to feel short of breath although her oxygen saturation has increased currently 96% on room air during my evaluation. Patient with dysuria and urinary incontinence, on multiple attempts unable to get a urine sample including multiple in and out  catheterizations. We collected a few drops of urine which was enough to send for urine culture. Given the patient's shortness of breath, elevated troponin, we will admit to the hospital for likely COPD exacerbation.  ____________________________________________   FINAL CLINICAL IMPRESSION(S) / ED DIAGNOSES  COPD exacerbation Dysuria   Harvest Dark, MD 04/14/15 1553

## 2015-04-15 DIAGNOSIS — J441 Chronic obstructive pulmonary disease with (acute) exacerbation: Secondary | ICD-10-CM | POA: Diagnosis not present

## 2015-04-15 LAB — GLUCOSE, CAPILLARY
GLUCOSE-CAPILLARY: 160 mg/dL — AB (ref 65–99)
Glucose-Capillary: 310 mg/dL — ABNORMAL HIGH (ref 65–99)
Glucose-Capillary: 315 mg/dL — ABNORMAL HIGH (ref 65–99)

## 2015-04-15 LAB — CBC
HCT: 31.4 % — ABNORMAL LOW (ref 35.0–47.0)
HEMOGLOBIN: 10 g/dL — AB (ref 12.0–16.0)
MCH: 26.9 pg (ref 26.0–34.0)
MCHC: 31.9 g/dL — AB (ref 32.0–36.0)
MCV: 84.4 fL (ref 80.0–100.0)
Platelets: 200 10*3/uL (ref 150–440)
RBC: 3.72 MIL/uL — ABNORMAL LOW (ref 3.80–5.20)
RDW: 15.5 % — ABNORMAL HIGH (ref 11.5–14.5)
WBC: 7.9 10*3/uL (ref 3.6–11.0)

## 2015-04-15 LAB — BASIC METABOLIC PANEL
Anion gap: 8 (ref 5–15)
BUN: 23 mg/dL — ABNORMAL HIGH (ref 6–20)
CALCIUM: 8.5 mg/dL — AB (ref 8.9–10.3)
CHLORIDE: 105 mmol/L (ref 101–111)
CO2: 23 mmol/L (ref 22–32)
CREATININE: 1.12 mg/dL — AB (ref 0.44–1.00)
GFR calc non Af Amer: 50 mL/min — ABNORMAL LOW (ref >60)
GFR, EST AFRICAN AMERICAN: 58 mL/min — AB (ref >60)
Glucose, Bld: 324 mg/dL — ABNORMAL HIGH (ref 65–99)
Potassium: 4.2 mmol/L (ref 3.5–5.1)
SODIUM: 136 mmol/L (ref 135–145)

## 2015-04-15 LAB — TROPONIN I

## 2015-04-15 MED ORDER — ENOXAPARIN SODIUM 40 MG/0.4ML ~~LOC~~ SOLN: 40.0000 mg | Freq: Two times a day (BID) | SUBCUTANEOUS | Status: DC

## 2015-04-15 MED ORDER — AZITHROMYCIN 250 MG PO TABS: ORAL_TABLET | ORAL | Status: DC

## 2015-04-15 MED ORDER — PREDNISONE 10 MG (21) PO TBPK: 10.0000 mg | ORAL_TABLET | Freq: Every day | ORAL | Status: DC

## 2015-04-15 MED ORDER — MOMETASONE FURO-FORMOTEROL FUM 100-5 MCG/ACT IN AERO: 2.0000 | INHALATION_SPRAY | Freq: Two times a day (BID) | RESPIRATORY_TRACT | Status: DC

## 2015-04-15 NOTE — Progress Notes (Signed)
Discharged home in care of son, dgt in law accompanied by 2 grandchildren. Pt transported to private vehicle in [facility]. Son advised of altered mental status with need to supervise pain medications and steriods effects for safety. Reviewed AVS/prescriptions with son with stated understanding.

## 2015-04-15 NOTE — Progress Notes (Signed)
   04/15/15 0920  Clinical Encounter Type  Visited With Patient  Visit Type Initial  Referral From Nurse  Consult/Referral To Chaplain  Spiritual Encounters  Spiritual Needs Prayer  Provided pastoral presence, prayer and support to patient on unit.  [place] [phone]

## 2015-04-15 NOTE — Progress Notes (Signed)
Anticoagulation monitoring(Lovenox):  69yo  ordered Lovenox 40 mg Q24h  Filed Weights   04/14/15 1132  Weight: 308 lb 8 oz (139.935 kg)   BMI 43.1  Lab Results  Component Value Date   CREATININE 1.12* 04/15/2015   CREATININE 0.88 04/14/2015   Estimated Creatinine Clearance: 75.7 mL/min (by C-G formula based on Cr of 1.12). Hemoglobin & Hematocrit     Component Value Date/Time   HGB 10.0* 04/15/2015 0509   HCT 31.4* 04/15/2015 0509     Per Protocol for Patient with estCrcl> 30 ml/min and BMI > 40, will transition to Lovenox 40 mg Q12h.      Rayna Sexton, PharmD, BCPS Clinical Pharmacist 04/15/2015 2:20 PM

## 2015-04-15 NOTE — Plan of Care (Signed)
Problem: Education: Goal: Knowledge of [facility] Education information/materials will improve Outcome: Progressing Pt is alert and oriented. PO fluids encouraged. Normal oxygen saturations on RA.  Problem: Safety: Goal: Ability to remain free from injury will improve Outcome: Progressing Pt is high fall risk. 1 assist to the [facility].  Encouraged to call for assistance when needed. Remains incontinent of urine at times.   Problem: Pain Managment: Goal: General experience of comfort will improve Outcome: Progressing Pt c/o back pain, scheduled pain medication given with effect. Continue to assess.

## 2015-04-15 NOTE — Discharge Instructions (Signed)
DIET:  Cardiac diet and Diabetic diet  DISCHARGE CONDITION:  Stable  ACTIVITY:  Activity as tolerated  OXYGEN:  Home Oxygen: No.   Oxygen Delivery: room air  DISCHARGE LOCATION:  home   If you experience worsening of your admission symptoms, develop shortness of breath, life threatening emergency, suicidal or homicidal thoughts you must seek medical attention immediately by calling 911 or calling your MD immediately  if symptoms less severe.  You Must read complete instructions/literature along with all the possible adverse reactions/side effects for all the Medicines you take and that have been prescribed to you. Take any new Medicines after you have completely understood and accpet all the possible adverse reactions/side effects.   Please note  You were cared for by a hospitalist during your hospital stay. If you have any questions about your discharge medications or the care you received while you were in the hospital after you are discharged, you can call the unit and asked to speak with the hospitalist on call if the hospitalist that took care of you is not available. Once you are discharged, your primary care physician will handle any further medical issues. Please note that NO REFILLS for any discharge medications will be authorized once you are discharged, as it is imperative that you return to your primary care physician (or establish a relationship with a primary care physician if you do not have one) for your aftercare needs so that they can reassess your need for medications and monitor your lab values.   Chronic Obstructive Pulmonary Disease Exacerbation Chronic obstructive pulmonary disease (COPD) is a common lung problem. In COPD, the flow of air from the lungs is limited. COPD exacerbations are times that breathing gets worse and you need extra treatment. Without treatment they can be life threatening. If they happen often, your lungs can become more damaged. If your COPD  gets worse, your doctor may treat you with:  Medicines.  Oxygen.  Different ways to clear your airway, such as using a mask. HOME CARE  Do not smoke.  Avoid tobacco smoke and other things that bother your lungs.  If given, take your antibiotic medicine as told. Finish the medicine even if you start to feel better.  Only take medicines as told by your doctor.  Drink enough fluids to keep your pee (urine) clear or pale yellow (unless your doctor has told you not to).  Use a cool mist machine (vaporizer).  If you use oxygen or a machine that turns liquid medicine into a mist (nebulizer), continue to use them as told.  Keep up with shots (vaccinations) as told by your doctor.  Exercise regularly.  Eat healthy foods.  Keep all doctor visits as told. GET HELP RIGHT AWAY IF:  You are very short of breath and it gets worse.  You have trouble talking.  You have bad chest pain.  You have blood in your spit (sputum).  You have a fever.  You keep throwing up (vomiting).  You feel weak, or you pass out (faint).  You feel confused.  You keep getting worse. MAKE SURE YOU:  Understand these instructions.  Will watch your condition.  Will get help right away if you are not doing well or get worse.   This information is not intended to replace advice given to you by your health care provider. Make sure you discuss any questions you have with your health care provider.   Document Released: 04/03/2011 Document Revised: 05/05/2014 Document Reviewed: 12/17/2012 Elsevier Interactive  Patient Education 2016 [employer].

## 2015-04-15 NOTE — Clinical Social Work Note (Signed)
Clinical Social Work Assessment  Patient Details  Name: Tina Hayes MRN: [id number] Date of Birth: 06/05/1945  Date of referral:  04/15/15               Reason for consult:  Rule-out Psychosocial, Discharge Planning                Permission sought to share information with:    Permission granted to share information::     Name::        Agency::     Relationship::     Contact Information:     Housing/Transportation Living arrangements for the past 2 months:  Single Family Home Source of Information:  Patient Patient Interpreter Needed:  None Criminal Activity/Legal Involvement Pertinent to Current Situation/Hospitalization:  No - Comment as needed Significant Relationships:  Adult Children, Pets, Other Family Members Lives with:  Adult Children, Relatives Do you feel safe going back to the place where you live?  Yes Need for family participation in patient care:  Yes (Comment)  Care giving concerns:  Patient needs PCP   Social Worker assessment / plan:  CSW consult to assess for discharge needs.  Patient is very pleasant, alert oriented and engaged in conversation with CSW.  Patient recently moved to [place] about a week ago from [place] to live with her son, his wife and two minor children.  Patient states she ran out of her medication and her son is trying to assist her with getting a doctor.  Per patient her son works but does have someone to sit with her during the day.  States the care taker is there from about 7:00 am to 5:00pm.   Patient uses a walker when at home.  States her only need at this time is a bedside commode as she is having difficulty getting to the bathroom. CSW discussed the importance of patient getting established with a PCP.  She would like to go to her son's doctor but is willing to take a list of local doctors just in case his doctor is unable to see her.  CSW provided patient with a list of local medical providers in [place].  Patient states she will follow up and  her son will assist her.  CSW signing off, no additional CSW services needed at this time.      Employment status:  Disabled (Comment on whether or not currently receiving Disability) Insurance information:  [employer] [place], [place] PT Recommendations:  Not assessed at this time Information / Referral to community resources:  Other (Comment Required) (RN care manager to assist with PCP)  Patient/Family's Response to care:  Patient was appreciative of information provided by CSW and will follow up to get PCP.  Patient/Family's Understanding of and Emotional Response to Diagnosis, Current Treatment, and Prognosis:  Patient  understands that she is under continued medical work up at this time.  Once medically stable she will discharge home.  Emotional Assessment Appearance:  Appears stated age Attitude/Demeanor/Rapport:    Affect (typically observed):  Accepting, Adaptable, Pleasant Orientation:  Oriented to Self, Oriented to Place, Oriented to  Time, Oriented to Situation Alcohol / Substance use:    Psych involvement (Current and /or in the community):  No (Comment)  Discharge Needs  Concerns to be addressed:  Care Coordination, Discharge Planning Concerns, Medication Concerns Readmission within the last 30 days:  No Current discharge risk:  Chronically ill, Dependent with Mobility Barriers to Discharge:  Continued Medical Work up, No Barriers Identified  Maurine Cane, LCSW 04/15/2015, 11:06 AM

## 2015-04-18 LAB — URINE CULTURE

## 2015-04-19 NOTE — Discharge Summary (Signed)
[facility]   PATIENT NAME: Tina Hayes    MR#:  [id number]  DATE OF BIRTH:  1945-11-25  DATE OF ADMISSION:  04/14/2015 ADMITTING PHYSICIAN: Dustin Flock, MD  DATE OF DISCHARGE: 04/15/2015  PRIMARY CARE PHYSICIAN: No primary care provider on file.    ADMISSION DIAGNOSIS:  Dysuria [R30.0] COPD exacerbation (HCC) [J44.1]  DISCHARGE DIAGNOSIS:  Active Problems:   COPD with acute exacerbation ([place])   SECONDARY DIAGNOSIS:   Past Medical History  Diagnosis Date  . COPD (chronic obstructive pulmonary disease) ([place])   . Coronary artery disease   . CHF (congestive heart failure) ([place])   . Diabetes mellitus without complication ([place])   . Hypertension   . Seizures ([place])   . Chronic back pain   . Depression   . Anxiety     HOSPITAL COURSE:   IMPRESSION AND PLAN:  Patient is a 69 year old African-American female with history of COPD presents with shortness of breath cough  1. Acute on chronic COPD exasperation: - improved on standard regimen of nebulizers, IV Solu-Medrol and inhalers - On day of discharge breathing comfortably with O2 sats in the 90s on room air - Discharged with prescriptions for Spiriva and Dulera as well as prednisone taper pack, azithromycin  2. Diabetes type 2:  - Blood sugars elevated during hospitalization due to steroids. She was continued on Lantus and sliding scale. Discharge with no changes to her home regimen but this should be followed by her primary care physician.  3. Hypertension: Stable and home regimen of clonidine and losartan. No changes.   4. Coronary artery disease with history of CABG unable to take aspirin continue carvedilol  5. Anxiety continue alprazolam as previously taking  6. Chronic back pain: We'll discuss with her primary care physician regarding refills of her OxyContin. None provided on discharge.   7. History of seizure disorder continue  Keppra  Follow-up: She was given information on primary care providers accepting new patients. She has just moved here from [place].  DISCHARGE CONDITIONS:   Stable  CONSULTS OBTAINED:  Treatment Team:  Dustin Flock, MD  DRUG ALLERGIES:   Allergies  Allergen Reactions  . Aspirin Anaphylaxis    DISCHARGE MEDICATIONS:   Discharge Medication List as of 04/15/2015 12:56 PM    START taking these medications   Details  azithromycin (ZITHROMAX) 250 MG tablet Take one tablet daily., Print    mometasone-formoterol (DULERA) 100-5 MCG/ACT AERO Inhale 2 puffs into the lungs 2 (two) times daily., Starting 04/15/2015, Until Discontinued, Print    predniSONE (STERAPRED UNI-PAK 21 TAB) 10 MG (21) TBPK tablet Take 1 tablet (10 mg total) by mouth daily. Take 6 tablets on day one, decrease by one tablet daily and then stop., Starting 04/15/2015, Until Discontinued, Print      CONTINUE these medications which have NOT CHANGED   Details  ALPRAZolam (XANAX) 0.5 MG tablet Take 0.5 mg by mouth 3 (three) times daily as needed for anxiety or sleep., Until Discontinued, Historical Med    carvedilol (COREG) 25 MG tablet Take 50 mg by mouth 2 (two) times daily., Until Discontinued, Historical Med    cloNIDine (CATAPRES) 0.1 MG tablet Take 0.3 mg by mouth 2 (two) times daily., Until Discontinued, Historical Med    hydrALAZINE (APRESOLINE) 25 MG tablet Take 25 mg by mouth 2 (two) times daily., Until Discontinued, Historical Med    imipramine (TOFRANIL) 50 MG tablet Take 200 mg by mouth daily., Until Discontinued,  Historical Med    insulin aspart (NOVOLOG) 100 UNIT/ML injection Inject 10 Units into the skin 3 (three) times daily before meals., Until Discontinued, Historical Med    insulin glargine (LANTUS) 100 UNIT/ML injection Inject 15 Units into the skin every morning., Until Discontinued, Historical Med    levETIRAcetam (KEPPRA) 750 MG tablet Take 750 mg by mouth 2 (two) times daily., Until  Discontinued, Historical Med    lisinopril (PRINIVIL,ZESTRIL) 10 MG tablet Take 10 mg by mouth daily., Until Discontinued, Historical Med    losartan (COZAAR) 100 MG tablet Take 100 mg by mouth daily., Until Discontinued, Historical Med    meloxicam (MOBIC) 7.5 MG tablet Take 7.5 mg by mouth daily., Until Discontinued, Historical Med    omeprazole (PRILOSEC) 40 MG capsule Take 40 mg by mouth 2 (two) times daily., Until Discontinued, Historical Med    oxyCODONE (OXYCONTIN) 60 MG 12 hr tablet Take 60 mg by mouth every 12 (twelve) hours., Until Discontinued, Historical Med    oxyCODONE-acetaminophen (PERCOCET/ROXICET) 5-325 MG tablet Take 1-2 tablets by mouth every 6 (six) hours as needed for moderate pain or severe pain., Until Discontinued, Historical Med    tiotropium (SPIRIVA) 18 MCG inhalation capsule Place 18 mcg into inhaler and inhale daily., Until Discontinued, Historical Med         DISCHARGE INSTRUCTIONS:    Discharge to home. No home health needs. Carb modified heart healthy diet.  If you experience worsening of your admission symptoms, develop shortness of breath, life threatening emergency, suicidal or homicidal thoughts you must seek medical attention immediately by calling 911 or calling your MD immediately  if symptoms less severe.  You Must read complete instructions/literature along with all the possible adverse reactions/side effects for all the Medicines you take and that have been prescribed to you. Take any new Medicines after you have completely understood and accept all the possible adverse reactions/side effects.   Please note  You were cared for by a hospitalist during your hospital stay. If you have any questions about your discharge medications or the care you received while you were in the hospital after you are discharged, you can call the unit and asked to speak with the hospitalist on call if the hospitalist that took care of you is not available. Once you  are discharged, your primary care physician will handle any further medical issues. Please note that NO REFILLS for any discharge medications will be authorized once you are discharged, as it is imperative that you return to your primary care physician (or establish a relationship with a primary care physician if you do not have one) for your aftercare needs so that they can reassess your need for medications and monitor your lab values.    Today   CHIEF COMPLAINT:   Chief Complaint  Patient presents with  . Respiratory Distress  . Urinary Frequency  . Diabetes  . Cough    HISTORY OF PRESENT ILLNESS:   Tina Hayes is a 69 y.o. female with a known history of COPD, coronary artery disease, CHF, diabetes, hypertension, seizures, chronic back pain who presents to the emergency room complaining of shortness of breath and cough for the past 2 days. According to the ED physician when she initially arrived her sats were in the 87's. She was wheezing. Patient reports that her shortness of breath started 2 days ago. She has had a dry cough. And has wheezing. Has not had any fevers or chills. Denies any chest pains or palpitations. Complains of  nausea. She reports that she has not used her inhalers in the past few months. Also has a nebulizer at home as well. She has some of her medications that she still has but is not taking some of the other ones.  VITAL SIGNS:  Blood pressure 128/57, pulse 62, temperature 98.6 F (37 C), temperature source Oral, resp. rate 18, height 5\' 11"  (1.803 m), weight 139.935 kg (308 lb 8 oz), SpO2 98 %.  I/O:  No intake or output data in the 24 hours ending 04/19/15 1530  PHYSICAL EXAMINATION:  GENERAL:  69 y.o.-year-old patient lying in the bed with no acute distress.  LUNGS: Normal breath sounds bilaterally, no wheezing, rales,rhonchi or crepitation. No use of accessory muscles of respiration.  CARDIOVASCULAR: S1, S2 normal. No murmurs, rubs, or gallops.   ABDOMEN: Soft, non-tender, non-distended. Bowel sounds present. No organomegaly or mass.  EXTREMITIES: No pedal edema, cyanosis, or clubbing.  NEUROLOGIC: Cranial nerves II through XII are intact. Muscle strength 5/5 in all extremities. Sensation intact. Gait not checked.  PSYCHIATRIC: The patient is alert and oriented x 3.  SKIN: No obvious rash, lesion, or ulcer.   DATA REVIEW:   CBC  Recent Labs Lab 04/15/15 0509  WBC 7.9  HGB 10.0*  HCT 31.4*  PLT 200    Chemistries   Recent Labs Lab 04/14/15 1105 04/15/15 0509  NA 137 136  K 5.4* 4.2  CL 104 105  CO2 24 23  GLUCOSE 210* 324*  BUN 15 23*  CREATININE 0.88 1.12*  CALCIUM 8.8* 8.5*  AST 17  --   ALT 11*  --   ALKPHOS 123  --   BILITOT 0.8  --     Cardiac Enzymes  Recent Labs Lab 04/15/15 0509  TROPONINI <0.03    Microbiology Results  Results for orders placed or performed during the hospital encounter of 04/14/15  Urine culture     Status: None   Collection Time: 04/14/15  2:08 PM  Result Value Ref Range Status   Specimen Description URINE, CLEAN CATCH  Final   Special Requests NONE  Final   Culture 60,000 COLONIES/ml ESCHERICHIA COLI  Final   Report Status 04/18/2015 FINAL  Final   Organism ID, Bacteria ESCHERICHIA COLI  Final      Susceptibility   Escherichia coli - MIC*    AMPICILLIN Value in next row Resistant      RESISTANT>=32    CEFAZOLIN Value in next row Sensitive      SENSITIVE<=4    CEFTRIAXONE Value in next row Sensitive      SENSITIVE<=1    ERTAPENEM Value in next row Sensitive      SENSITIVE<=0.5    IMIPENEM Value in next row Sensitive      SENSITIVE<=0.25    GENTAMICIN Value in next row Sensitive      SENSITIVE<=1    LEVOFLOXACIN Value in next row Resistant      RESISTANT>=8    NITROFURANTOIN Value in next row Sensitive      SENSITIVE<=16    TRIMETH/SULFA Value in next row Resistant      RESISTANT>=320    PIP/TAZO Value in next row Sensitive      SENSITIVE<=4    * 60,000  COLONIES/ml ESCHERICHIA COLI    RADIOLOGY:  No results found.  EKG:   Orders placed or performed during the hospital encounter of 04/14/15  . EKG 12-Lead  . EKG 12-Lead  . EKG 12-Lead  . EKG 12-Lead  . EKG  Management plans discussed with the patient, family and they are in agreement.  CODE STATUS:  Advance Directive Documentation        Most Recent Value   Type of Advance Directive  Healthcare Power of Attorney, Living will [reports son is Marcus Samuels]   Pre-existing out of facility DNR order (yellow form or pink MOST form)     "MOST" Form in Place?        TOTAL TIME TAKING CARE OF THIS PATIENT: 35 minutes.  Greater than 50% of time spent in care coordination and counseling.  Myrtis Ser M.D on 04/19/2015 at 3:30 PM  Between 7am to 6pm - Pager - [phone]  After 6pm go to [email or web address] - password [email or web address]  [facility]  Office  [phone]  CC: Primary care physician; No primary care provider on file.

## 2015-06-03 ENCOUNTER — Inpatient Hospital Stay (HOSPITAL_COMMUNITY)
Admission: AD | Admit: 2015-06-03 | Discharge: 2015-06-05 | DRG: 305 | Disposition: A | Discharge status: Home / Self Care | Payer: Medicare Other | Source: Other Acute Inpatient Hospital | Attending: Internal Medicine | Admitting: Internal Medicine

## 2015-06-03 ENCOUNTER — Emergency Department: Payer: Medicare Other

## 2015-06-03 ENCOUNTER — Emergency Department
Admission: EM | Admit: 2015-06-03 | Discharge: 2015-06-03 | Disposition: A | Discharge status: Other Acute Inpatient Hospital | Payer: Medicare Other | Attending: Student | Admitting: Student

## 2015-06-03 ENCOUNTER — Encounter: Payer: Self-pay | Admitting: Emergency Medicine

## 2015-06-03 DIAGNOSIS — Z6841 Body Mass Index (BMI) 40.0 and over, adult: Secondary | ICD-10-CM | POA: Diagnosis not present

## 2015-06-03 DIAGNOSIS — R569 Unspecified convulsions: Secondary | ICD-10-CM | POA: Diagnosis present

## 2015-06-03 DIAGNOSIS — Z794 Long term (current) use of insulin: Secondary | ICD-10-CM | POA: Insufficient documentation

## 2015-06-03 DIAGNOSIS — I2510 Atherosclerotic heart disease of native coronary artery without angina pectoris: Secondary | ICD-10-CM | POA: Diagnosis present

## 2015-06-03 DIAGNOSIS — Z792 Long term (current) use of antibiotics: Secondary | ICD-10-CM | POA: Insufficient documentation

## 2015-06-03 DIAGNOSIS — J449 Chronic obstructive pulmonary disease, unspecified: Secondary | ICD-10-CM | POA: Diagnosis present

## 2015-06-03 DIAGNOSIS — G8929 Other chronic pain: Secondary | ICD-10-CM | POA: Diagnosis present

## 2015-06-03 DIAGNOSIS — Z87891 Personal history of nicotine dependence: Secondary | ICD-10-CM | POA: Insufficient documentation

## 2015-06-03 DIAGNOSIS — R0602 Shortness of breath: Secondary | ICD-10-CM | POA: Diagnosis present

## 2015-06-03 DIAGNOSIS — Z951 Presence of aortocoronary bypass graft: Secondary | ICD-10-CM | POA: Diagnosis not present

## 2015-06-03 DIAGNOSIS — M549 Dorsalgia, unspecified: Secondary | ICD-10-CM | POA: Diagnosis present

## 2015-06-03 DIAGNOSIS — E876 Hypokalemia: Secondary | ICD-10-CM | POA: Diagnosis present

## 2015-06-03 DIAGNOSIS — J441 Chronic obstructive pulmonary disease with (acute) exacerbation: Secondary | ICD-10-CM | POA: Diagnosis not present

## 2015-06-03 DIAGNOSIS — E119 Type 2 diabetes mellitus without complications: Secondary | ICD-10-CM | POA: Diagnosis not present

## 2015-06-03 DIAGNOSIS — E669 Obesity, unspecified: Secondary | ICD-10-CM | POA: Diagnosis not present

## 2015-06-03 DIAGNOSIS — I160 Hypertensive urgency: Secondary | ICD-10-CM | POA: Diagnosis present

## 2015-06-03 DIAGNOSIS — D638 Anemia in other chronic diseases classified elsewhere: Secondary | ICD-10-CM | POA: Diagnosis present

## 2015-06-03 DIAGNOSIS — Z791 Long term (current) use of non-steroidal anti-inflammatories (NSAID): Secondary | ICD-10-CM | POA: Diagnosis not present

## 2015-06-03 DIAGNOSIS — Z79891 Long term (current) use of opiate analgesic: Secondary | ICD-10-CM | POA: Diagnosis not present

## 2015-06-03 DIAGNOSIS — Z7951 Long term (current) use of inhaled steroids: Secondary | ICD-10-CM | POA: Insufficient documentation

## 2015-06-03 DIAGNOSIS — Z9119 Patient's noncompliance with other medical treatment and regimen: Secondary | ICD-10-CM | POA: Diagnosis not present

## 2015-06-03 DIAGNOSIS — Z79899 Other long term (current) drug therapy: Secondary | ICD-10-CM | POA: Insufficient documentation

## 2015-06-03 DIAGNOSIS — J029 Acute pharyngitis, unspecified: Secondary | ICD-10-CM

## 2015-06-03 HISTORY — DX: Hypertensive urgency: I16.0

## 2015-06-03 LAB — CBC WITH DIFFERENTIAL/PLATELET
BASOS ABS: 0.1 10*3/uL (ref 0–0.1)
Basophils Relative: 1 %
EOS ABS: 0.2 10*3/uL (ref 0–0.7)
EOS PCT: 4 %
HCT: 33.7 % — ABNORMAL LOW (ref 35.0–47.0)
Hemoglobin: 10.9 g/dL — ABNORMAL LOW (ref 12.0–16.0)
Lymphocytes Relative: 53 %
Lymphs Abs: 2.8 10*3/uL (ref 1.0–3.6)
MCH: 27 pg (ref 26.0–34.0)
MCHC: 32.2 g/dL (ref 32.0–36.0)
MCV: 83.9 fL (ref 80.0–100.0)
MONO ABS: 0.2 10*3/uL (ref 0.2–0.9)
Monocytes Relative: 3 %
Neutro Abs: 2 10*3/uL (ref 1.4–6.5)
Neutrophils Relative %: 39 %
PLATELETS: 241 10*3/uL (ref 150–440)
RBC: 4.02 MIL/uL (ref 3.80–5.20)
RDW: 15.7 % — AB (ref 11.5–14.5)
WBC: 5.2 10*3/uL (ref 3.6–11.0)

## 2015-06-03 LAB — BASIC METABOLIC PANEL
Anion gap: 9 (ref 5–15)
BUN: 14 mg/dL (ref 6–20)
CALCIUM: 9 mg/dL (ref 8.9–10.3)
CHLORIDE: 109 mmol/L (ref 101–111)
CO2: 25 mmol/L (ref 22–32)
CREATININE: 0.72 mg/dL (ref 0.44–1.00)
GFR calc non Af Amer: >60 mL/min (ref >60)
Glucose, Bld: 72 mg/dL (ref 65–99)
Potassium: 2.6 mmol/L — CL (ref 3.5–5.1)
SODIUM: 143 mmol/L (ref 135–145)

## 2015-06-03 LAB — GLUCOSE, CAPILLARY: GLUCOSE-CAPILLARY: 54 mg/dL — AB (ref 65–99)

## 2015-06-03 LAB — POCT RAPID STREP A: Streptococcus, Group A Screen (Direct): NEGATIVE

## 2015-06-03 LAB — TROPONIN I: Troponin I: 0.03 ng/mL (ref <0.031)

## 2015-06-03 LAB — BRAIN NATRIURETIC PEPTIDE: B NATRIURETIC PEPTIDE 5: 172 pg/mL — AB (ref 0.0–100.0)

## 2015-06-03 MED ORDER — NICARDIPINE HCL IN NACL 20-0.86 MG/200ML-% IV SOLN
3.0000 mg/h | INTRAVENOUS | Status: DC
  Administered 2015-06-03 – 2015-06-04 (×2): 3 mg/h via INTRAVENOUS
  Filled 2015-06-03 (×3): qty 200

## 2015-06-03 MED ORDER — SODIUM CHLORIDE 0.9 % IV SOLN: 250.0000 mL | INTRAVENOUS | Status: DC | PRN

## 2015-06-03 MED ORDER — IPRATROPIUM-ALBUTEROL 0.5-2.5 (3) MG/3ML IN SOLN
3.0000 mL | Freq: Once | RESPIRATORY_TRACT | Status: AC
Start: 2015-06-03 — End: 2015-06-03
  Administered 2015-06-03: 3 mL via RESPIRATORY_TRACT
  Filled 2015-06-03: qty 3

## 2015-06-03 MED ORDER — POTASSIUM CHLORIDE CRYS ER 20 MEQ PO TBCR
40.0000 meq | EXTENDED_RELEASE_TABLET | Freq: Once | ORAL | Status: AC
Start: 2015-06-03 — End: 2015-06-03
  Administered 2015-06-03: 40 meq via ORAL
  Filled 2015-06-03: qty 2

## 2015-06-03 MED ORDER — OXYCODONE-ACETAMINOPHEN 5-325 MG PO TABS
2.0000 | ORAL_TABLET | Freq: Once | ORAL | Status: AC
Start: 2015-06-03 — End: 2015-06-03
  Administered 2015-06-03: 2 via ORAL
  Filled 2015-06-03: qty 2

## 2015-06-03 MED ORDER — IPRATROPIUM-ALBUTEROL 0.5-2.5 (3) MG/3ML IN SOLN
3.0000 mL | Freq: Four times a day (QID) | RESPIRATORY_TRACT | Status: DC
  Administered 2015-06-03 – 2015-06-04 (×4): 3 mL via RESPIRATORY_TRACT
  Filled 2015-06-03 (×4): qty 3

## 2015-06-03 MED ORDER — TRAMADOL HCL 50 MG PO TABS
50.0000 mg | ORAL_TABLET | Freq: Four times a day (QID) | ORAL | Status: DC | PRN
  Administered 2015-06-04 – 2015-06-05 (×3): 50 mg via ORAL
  Filled 2015-06-03 (×3): qty 1

## 2015-06-03 MED ORDER — SODIUM CHLORIDE 0.9% FLUSH: 3.0000 mL | INTRAVENOUS | Status: DC | PRN

## 2015-06-03 MED ORDER — HYDRALAZINE HCL 20 MG/ML IJ SOLN
10.0000 mg | Freq: Once | INTRAMUSCULAR | Status: AC
  Administered 2015-06-03: 10 mg via INTRAVENOUS
  Filled 2015-06-03: qty 1

## 2015-06-03 MED ORDER — DEXTROSE 50 % IV SOLN
1.0000 | Freq: Once | INTRAVENOUS | Status: AC
  Administered 2015-06-03: 50 mL via INTRAVENOUS
  Filled 2015-06-03: qty 50

## 2015-06-03 MED ORDER — HEPARIN SODIUM (PORCINE) 5000 UNIT/ML IJ SOLN
5000.0000 [IU] | Freq: Three times a day (TID) | INTRAMUSCULAR | Status: DC
  Administered 2015-06-04 – 2015-06-05 (×5): 5000 [IU] via SUBCUTANEOUS
  Filled 2015-06-03 (×6): qty 1

## 2015-06-03 MED ORDER — SODIUM CHLORIDE 0.9% FLUSH
3.0000 mL | Freq: Two times a day (BID) | INTRAVENOUS | Status: DC
  Administered 2015-06-03 – 2015-06-05 (×3): 3 mL via INTRAVENOUS

## 2015-06-03 MED ORDER — LOSARTAN POTASSIUM 50 MG PO TABS
100.0000 mg | ORAL_TABLET | Freq: Every day | ORAL | Status: DC
  Administered 2015-06-04 – 2015-06-05 (×2): 100 mg via ORAL
  Filled 2015-06-03 (×2): qty 2

## 2015-06-03 MED ORDER — CLONIDINE HCL 0.1 MG PO TABS
0.1000 mg | ORAL_TABLET | Freq: Once | ORAL | Status: AC
  Administered 2015-06-03: 0.1 mg via ORAL
  Filled 2015-06-03: qty 1

## 2015-06-03 MED ORDER — BUDESONIDE 0.25 MG/2ML IN SUSP
0.2500 mg | Freq: Two times a day (BID) | RESPIRATORY_TRACT | Status: DC
  Administered 2015-06-03 – 2015-06-05 (×4): 0.25 mg via RESPIRATORY_TRACT
  Filled 2015-06-03 (×4): qty 2

## 2015-06-03 MED ORDER — HYDRALAZINE HCL 25 MG PO TABS
25.0000 mg | ORAL_TABLET | Freq: Two times a day (BID) | ORAL | Status: DC
  Administered 2015-06-04 – 2015-06-05 (×4): 25 mg via ORAL
  Filled 2015-06-03 (×4): qty 1

## 2015-06-03 MED ORDER — INSULIN ASPART 100 UNIT/ML ~~LOC~~ SOLN
0.0000 [IU] | Freq: Three times a day (TID) | SUBCUTANEOUS | Status: DC
  Administered 2015-06-05: 2 [IU] via SUBCUTANEOUS

## 2015-06-03 MED ORDER — CLONIDINE HCL 0.1 MG PO TABS
0.3000 mg | ORAL_TABLET | Freq: Two times a day (BID) | ORAL | Status: DC
  Administered 2015-06-04: 0.3 mg via ORAL
  Filled 2015-06-03 (×4): qty 3

## 2015-06-03 MED ORDER — HYDROCODONE-ACETAMINOPHEN 5-325 MG PO TABS
1.0000 | ORAL_TABLET | Freq: Four times a day (QID) | ORAL | Status: DC | PRN
  Administered 2015-06-03 – 2015-06-04 (×3): 1 via ORAL
  Filled 2015-06-03 (×3): qty 1

## 2015-06-03 MED ORDER — NICARDIPINE HCL IN NACL 20-0.86 MG/200ML-% IV SOLN
3.0000 mg/h | Freq: Once | INTRAVENOUS | Status: AC
  Administered 2015-06-03: 5 mg/h via INTRAVENOUS
  Filled 2015-06-03 (×2): qty 200

## 2015-06-03 NOTE — H&P (Addendum)
PULMONARY / CRITICAL CARE MEDICINE   Name: Shuntel Jendro MRN: [id number] DOB: [birth year]    ADMISSION DATE:  06/03/2015 CONSULTATION DATE:  06/03/2015   REFERRING MD:  [facility]  CHIEF COMPLAINT:  Hypertensive Urgency  HISTORY OF PRESENT ILLNESS:   70 year old obese, AA female with COPD NOS, CAD s/p CABG, CHF NOS, DM, BP, Seizures, chronic back pain. Was evicted from her home in [place], [place] and moved to [place], [place] 3 months ago. Reports that at baseline she takes several BP meds and despite that BP runs 140-200s sbp with avg 170-190 sbp. Last 3 months due to access issues and move to [place] has not taken any of her bp meds. Reported to ER at [facility] 06/03/2015 with non specific dyspnea, chest pain, back pain, sore throat, runny nose. She denied chest pain to ER but admits to me. She denied diarhea to ER but reports she has had diarrhea x 2 days bu  Now resolved. Poor historian . BP 248/78 in ER and started on cardene gtt and due to lack of nursing resources at [facility]. She is not intubated  Currently feels baseline except for activation of chronic back pain  PAST MEDICAL HISTORY :  She  has a past medical history of COPD (chronic obstructive pulmonary disease) ([place]); Coronary artery disease; CHF (congestive heart failure) ([place]); Diabetes mellitus without complication ([place]); Hypertension; Seizures ([place]); Chronic back pain; Depression; and Anxiety.  PAST SURGICAL HISTORY: She  has past surgical history that includes Coronary artery bypass graft; Knee surgery; and Ankle surgery.  Allergies  Allergen Reactions  . Aspirin Anaphylaxis    No current facility-administered medications on file prior to encounter.   Current Outpatient Prescriptions on File Prior to Encounter  Medication Sig  . ALPRAZolam (XANAX) 0.5 MG tablet Take 0.5 mg by mouth 3 (three) times daily as needed for anxiety or sleep.  [name] azithromycin (ZITHROMAX) 250 MG tablet Take one tablet daily.  . carvedilol  (COREG) 25 MG tablet Take 50 mg by mouth 2 (two) times daily.  . cloNIDine (CATAPRES) 0.1 MG tablet Take 0.3 mg by mouth 2 (two) times daily.  . hydrALAZINE (APRESOLINE) 25 MG tablet Take 25 mg by mouth 2 (two) times daily.  [name] imipramine (TOFRANIL) 50 MG tablet Take 200 mg by mouth daily.  . insulin aspart (NOVOLOG) 100 UNIT/ML injection Inject 10 Units into the skin 3 (three) times daily before meals.  . insulin glargine (LANTUS) 100 UNIT/ML injection Inject 15 Units into the skin every morning.  . levETIRAcetam (KEPPRA) 750 MG tablet Take 750 mg by mouth 2 (two) times daily.  [name] lisinopril (PRINIVIL,ZESTRIL) 10 MG tablet Take 10 mg by mouth daily.  [name] losartan (COZAAR) 100 MG tablet Take 100 mg by mouth daily.  . meloxicam (MOBIC) 7.5 MG tablet Take 7.5 mg by mouth daily.  . mometasone-formoterol (DULERA) 100-5 MCG/ACT AERO Inhale 2 puffs into the lungs 2 (two) times daily.  [name] omeprazole (PRILOSEC) 40 MG capsule Take 40 mg by mouth 2 (two) times daily.  [name] oxyCODONE (OXYCONTIN) 60 MG 12 hr tablet Take 60 mg by mouth every 12 (twelve) hours.  [name] oxyCODONE-acetaminophen (PERCOCET/ROXICET) 5-325 MG tablet Take 1-2 tablets by mouth every 6 (six) hours as needed for moderate pain or severe pain.  . predniSONE (STERAPRED UNI-PAK 21 TAB) 10 MG (21) TBPK tablet Take 1 tablet (10 mg total) by mouth daily. Take 6 tablets on day one, decrease by one tablet daily and then stop.  [name]  tiotropium (SPIRIVA) 18 MCG inhalation capsule Place 18 mcg into inhaler and inhale daily.    FAMILY HISTORY:  Her has no family status information on file.   SOCIAL HISTORY: She  reports that she has quit smoking. She has never used smokeless tobacco. She reports that she does not drink alcohol or use illicit drugs.  REVIEW OF SYSTEMS:   Per HPI   VITAL SIGNS: Resp 17  HEMODYNAMICS:    VENTILATOR SETTINGS:    INTAKE / OUTPUT:    PHYSICAL EXAMINATION: General:  Obese female. No distress. Looks chronicaly  unwell Neuro:  AxOx3. Speech normal . Moves all 4s. Reports back pain HEENT:  [place] class 3. No neck nodes. No elevated JVP Cardiovascular:  RRR. No murmurs Lungs:  No distress. CTA bialterally though obesity makes it difficult Abdomen:  Obese, soft, non-tender Musculoskeletal:  No cyanopsis. No clubbing. No edema Skin:  Intact in exposed areas  LABS:  PULMONARY No results for input(s): PHART, PCO2ART, PO2ART, HCO3, TCO2, O2SAT in the last 168 hours.  Invalid input(s): PCO2, PO2  CBC  Recent Labs Lab 06/03/15 1513  HGB 10.9*  HCT 33.7*  WBC 5.2  PLT 241    COAGULATION No results for input(s): INR in the last 168 hours.  CARDIAC   Recent Labs Lab 06/03/15 1704  TROPONINI 0.03   No results for input(s): PROBNP in the last 168 hours.   CHEMISTRY  Recent Labs Lab 06/03/15 1704  NA 143  K 2.6*  CL 109  CO2 25  GLUCOSE 72  BUN 14  CREATININE 0.72  CALCIUM 9.0   Estimated Creatinine Clearance: 104.4 mL/min (by C-G formula based on Cr of 0.72).   LIVER No results for input(s): AST, ALT, ALKPHOS, BILITOT, PROT, ALBUMIN, INR in the last 168 hours.   INFECTIOUS No results for input(s): LATICACIDVEN, PROCALCITON in the last 168 hours.   ENDOCRINE CBG (last 3)   Recent Labs  06/03/15 1955  GLUCAP 54*         IMAGING x48h  - image(s) personally visualized  -   highlighted in bold Dg Chest 2 View  06/03/2015  CLINICAL DATA:  Cough and short of breath EXAM: CHEST  2 VIEW COMPARISON:  04/14/2015 FINDINGS: CABG. Mild cardiac enlargement without heart failure or edema. Negative for pneumonia or effusion. Small hiatal hernia. IMPRESSION: No active cardiopulmonary disease. Electronically Signed   By: Franchot Gallo M.D.   On: 06/03/2015 15:26        ASSESSMENT / PLAN:  PULMONARY A: Hx of COPD . Currently normoxic and no distress on RA  P:   Scheduled nebs  CARDIOVASCULAR A:  Hx of CABG Hx of poorly controlled BP Hx of lack of access  to bp meds - baseline meds listed as clonidine, hydralazine, coreg, lisnopril and losartan  #current Hypertensive urgency but could all be relative baseline. Some dyspnea/chest pain but no evidence of encephalopathy or pulmonary edema or renal insuff - access to care issue +   P:  cardene gtt - sbp 170-180 goal Restart oral meds 0 clonidiine , losartan and hydralazine.   Avoid coreg (copd) and lisinopril for now  RENAL A:   Hypokalemia - repleted in ER at [facility] AT  Risk for renal insuff  P:   Check mag, bmet, phos monitor  GASTROINTESTINAL A:   Hx of resolved diarrha (family had it) prior to admission P:   PPI  HEMATOLOGIC A:   Anemia of chronic disease P:  PRBC for hgb <  7gm% or < 8gm% if NSTEMI +  INFECTIOUS A:   No evidence of infection P:   Monitor  ENDOCRINE A:   Hx of dm   P:   ssi  NEUROLOGIC A:   Hx of chronic back pain on percocept Intact but at risk acute encephalopathy P:   RASS goal: 0 Pain control with ultram - hold off  Percocet for  now    FAMILY  - Updates: patient updated at bedside in [place] ICU  - Inter-disciplinary family meet or Palliative Care meeting due by:  06/10/15 which is day 7   The patient is critically ill with multiple organ systems failure and requires high complexity decision making for assessment and support, frequent evaluation and titration of therapies, application of advanced monitoring technologies and extensive interpretation of multiple databases.   Critical Care Time devoted to patient care services described in this note is  45  Minutes. This time reflects time of care of this signee Dr Brand Males. This critical care time does not reflect procedure time, or teaching time or supervisory time of PA/NP/Med student/Med Resident etc but could involve care discussion time    Dr. Brand Males, M.D., [facility].C.P Pulmonary and Critical Care Medicine Staff Physician [facility] Pager: [phone], If no answer or between  15:00h - 7:00h: call [phone]  06/03/2015 10:16 PM

## 2015-06-03 NOTE — ED Notes (Signed)
Pt in via EMS with complaints of shortness of breath, cough x 3 days; states "everybody at home has strep."  Pt also reports being out of medications for about 3 months now, stating she recently moved here from [place] and has not been able to get a new primary care doctor.  Pt A/Ox4, hypertensive upon arrival.  No immediate distress at this time. MD at bedside.

## 2015-06-03 NOTE — ED Notes (Signed)
CARDENE changed to 2.5mg /hr. Blood pressure 168/62 at 6:20pm

## 2015-06-03 NOTE — ED Provider Notes (Signed)
[facility] Emergency Department Provider Note  ____________________________________________  Time seen: Approximately 3:04 PM  I have reviewed the triage vital signs and the nursing notes.   HISTORY  Chief Complaint Shortness of Breath    HPI Tina Hayes is a 70 y.o. female with COPD, coronary artery disease s/p CABG, CHF, diabetes, hypertension, seizures, chronic back pain who presents for evaluation of 3 days shortness of breath with cough as well as sore throat and runny nose, gradual onset, constant since onset, currently moderate. No chest pain. Multiple sick contacts with sore throat, runny nose and cough. She has had chills but no fever. No abdominal pain, vomiting or diarrhea. She reports she has been off of all of her medications for nearly 3 months.   Past Medical History  Diagnosis Date  . COPD (chronic obstructive pulmonary disease) ([place])   . Coronary artery disease   . CHF (congestive heart failure) ([name])   . Diabetes mellitus without complication ([place])   . Hypertension   . Seizures ([place])   . Chronic back pain   . Depression   . Anxiety     Patient Active Problem List   Diagnosis Date Noted  . COPD with acute exacerbation ([place]) 04/14/2015    Past Surgical History  Procedure Laterality Date  . Coronary artery bypass graft    . Knee surgery    . Ankle surgery      Current Outpatient Rx  Name  Route  Sig  Dispense  Refill  . ALPRAZolam (XANAX) 0.5 MG tablet   Oral   Take 0.5 mg by mouth 3 (three) times daily as needed for anxiety or sleep.         [name] azithromycin (ZITHROMAX) 250 MG tablet      Take one tablet daily.   6 each   0   . carvedilol (COREG) 25 MG tablet   Oral   Take 50 mg by mouth 2 (two) times daily.         . cloNIDine (CATAPRES) 0.1 MG tablet   Oral   Take 0.3 mg by mouth 2 (two) times daily.         . hydrALAZINE (APRESOLINE) 25 MG tablet   Oral   Take 25 mg by mouth 2 (two) times daily.          [name] imipramine (TOFRANIL) 50 MG tablet   Oral   Take 200 mg by mouth daily.         . insulin aspart (NOVOLOG) 100 UNIT/ML injection   Subcutaneous   Inject 10 Units into the skin 3 (three) times daily before meals.         . insulin glargine (LANTUS) 100 UNIT/ML injection   Subcutaneous   Inject 15 Units into the skin every morning.         . levETIRAcetam (KEPPRA) 750 MG tablet   Oral   Take 750 mg by mouth 2 (two) times daily.         [name] lisinopril (PRINIVIL,ZESTRIL) 10 MG tablet   Oral   Take 10 mg by mouth daily.         [name] losartan (COZAAR) 100 MG tablet   Oral   Take 100 mg by mouth daily.         . meloxicam (MOBIC) 7.5 MG tablet   Oral   Take 7.5 mg by mouth daily.         . mometasone-formoterol (DULERA) 100-5 MCG/ACT AERO   Inhalation  Inhale 2 puffs into the lungs 2 (two) times daily.   1 Inhaler   0   . omeprazole (PRILOSEC) 40 MG capsule   Oral   Take 40 mg by mouth 2 (two) times daily.         [name] oxyCODONE (OXYCONTIN) 60 MG 12 hr tablet   Oral   Take 60 mg by mouth every 12 (twelve) hours.         [name] oxyCODONE-acetaminophen (PERCOCET/ROXICET) 5-325 MG tablet   Oral   Take 1-2 tablets by mouth every 6 (six) hours as needed for moderate pain or severe pain.         . predniSONE (STERAPRED UNI-PAK 21 TAB) 10 MG (21) TBPK tablet   Oral   Take 1 tablet (10 mg total) by mouth daily. Take 6 tablets on day one, decrease by one tablet daily and then stop.   21 tablet   0   . tiotropium (SPIRIVA) 18 MCG inhalation capsule   Inhalation   Place 18 mcg into inhaler and inhale daily.           Allergies Aspirin  Family History  Problem Relation Age of Onset  . Diabetes    . Hypertension      Social History Social History  Substance Use Topics  . Smoking status: Former Research scientist (life sciences)  . Smokeless tobacco: Never Used  . Alcohol Use: No    Review of Systems Constitutional: No fever; +chills Eyes: No visual changes. ENT: +sore  throat. Cardiovascular: Denies chest pain. Respiratory: + shortness of breath. Gastrointestinal: No abdominal pain.  No nausea, no vomiting.  No diarrhea.  No constipation. Genitourinary: Negative for dysuria. Musculoskeletal: Positive for chronic back pain. Skin: Negative for rash. Neurological: Negative for headaches, focal weakness or numbness.  10-point ROS otherwise negative.  ____________________________________________   PHYSICAL EXAM:  Filed Vitals:   06/03/15 1821 06/03/15 1825 06/03/15 1830 06/03/15 1835  BP:  169/69 152/70 171/74  Pulse: 71 68 67 65  Temp:      TempSrc:      Resp: 15 15 16 22   Height:      Weight:      SpO2: 99% 100% 100% 99%     Constitutional: Alert and oriented. Nontoxic appearing and in no acute distress. Eyes: Conjunctivae are normal. PERRL. EOMI. Head: Atraumatic. Nose: No congestion/rhinnorhea. Mouth/Throat: Mucous membranes are moist.  Oropharynx mildly erythematous. Neck: No stridor.  Cardiovascular: Normal rate, regular rhythm. Grossly normal heart sounds.  Good peripheral circulation. Respiratory: Normal respiratory effort.  No retractions. Lungs CTAB. Gastrointestinal: Soft and nontender. No distention.  No CVA tenderness. Genitourinary: deferred Musculoskeletal: No lower extremity tenderness nor edema.  No joint effusions. Neurologic:  Normal speech and language. No gross focal neurologic deficits are appreciated. No gait instability. Skin:  Skin is warm, dry and intact. No rash noted. Psychiatric: Mood and affect are normal. Speech and behavior are normal.  ____________________________________________   LABS (all labs ordered are listed, but only abnormal results are displayed)  Labs Reviewed  CBC WITH DIFFERENTIAL/PLATELET - Abnormal; Notable for the following:    Hemoglobin 10.9 (*)    HCT 33.7 (*)    RDW 15.7 (*)    All other components within normal limits  BRAIN NATRIURETIC PEPTIDE - Abnormal; Notable for the  following:    B Natriuretic Peptide 172.0 (*)    All other components within normal limits  BASIC METABOLIC PANEL - Abnormal; Notable for the following:    Potassium 2.6 (*)  All other components within normal limits  CULTURE, GROUP A STREP [facility])  TROPONIN I  POCT RAPID STREP A   ____________________________________________  EKG  ED ECG REPORT I, Joanne Gavel, the attending physician, personally viewed and interpreted this ECG.   Date: 06/03/2015  EKG Time: 15:06  Rate: 61  Rhythm: normal sinus rhythm  Axis: normal  Intervals:none  ST&T Change: No acute ST elevation. Minimal ST depression in V4, V5, V6 is chronic and unchanged when compared to EKG on 04/14/2015.  ____________________________________________  RADIOLOGY  CXR  IMPRESSION: No active cardiopulmonary disease. ____________________________________________   PROCEDURES  Procedure(s) performed: None  Critical Care performed: Total critical care time spent 35 minutes.  ____________________________________________   INITIAL IMPRESSION / ASSESSMENT AND PLAN / ED COURSE  Pertinent labs & imaging results that were available during my care of the patient were reviewed by me and considered in my medical decision making (see chart for details).  Tina Hayes is a 70 y.o. female with COPD, coronary artery disease s/p CABG, CHF, diabetes, hypertension, seizures, chronic back pain who presents for evaluation of 3 days shortness of breath with cough as well as sore throat and runny nose. On exam, she is nontoxic appearing. Afebrile, vital signs stable with the exception of her moderate to severe hypertension which I suspect is secondary to her noncompliance with her multiple antihypertensive medications. We'll give hydralazine and reassess. We'll obtain screening labs, chest x-ray, give DuoNeb treatment for possible COPD/bronchitis which may be virally induced given URI symptoms. Reassess for  disposition.  ----------------------------------------- 4:52 PM on 06/03/2015 -----------------------------------------  Patient remains severely hypertensive with blood pressure 248/78 despite IV hydralazine. Heart rate is 65 bpm which limits the use of beta blockers for blood pressure control. Nicardipine drip ordered. We have no ability to admit the patient here due to limited ICU staffing therefore we'll transfer to [facility].  ----------------------------------------- 5:41 PM on 06/03/2015 -----------------------------------------  Blood pressure improving on nicardipine drip. I discussed the case with Dr. Corinna Lines of the [facility] ICU who has accepted admission on behalf of Dr.Yacoub.   ----------------------------------------- 7:24 PM on 06/03/2015 -----------------------------------------  Patient is accepted at however remained in the emergency department here [facility] awaiting transportation. Blood pressure improved to 171/74 on nicardipine drip. Potassium 2.6, we'll replete. Troponin negative. BNP elevated at 172. Chest x-ray shows acute Cardiopulmonary process. CBC was notable for anemia with hemoglobin 10.9 is chronic ____________________________________________   FINAL CLINICAL IMPRESSION(S) / ED DIAGNOSES  Final diagnoses:  Sore throat  SOB (shortness of breath)  Hypertensive urgency, malignant      Joanne Gavel, MD 06/03/15 [phone]

## 2015-06-03 NOTE — Progress Notes (Signed)
[place] Progress Note Patient Name: Tina Hayes DOB: Jan 01, 1946 MRN: [id number]   Date of Service  06/03/2015  HPI/Events of Note  Patient c/o chronic back pain. Takes Percocet at home.  eICU Interventions  Will order Norco 5 mg / 325 mg PO Q 6 hours PRN pain.      Intervention Category Intermediate Interventions: Pain - evaluation and management  Nox Talent Cornelia Copa 06/03/2015, 9:53 PM

## 2015-06-04 ENCOUNTER — Encounter (HOSPITAL_COMMUNITY): Payer: Self-pay

## 2015-06-04 LAB — GLUCOSE, CAPILLARY
GLUCOSE-CAPILLARY: 42 mg/dL — AB (ref 65–99)
GLUCOSE-CAPILLARY: 95 mg/dL (ref 65–99)
GLUCOSE-CAPILLARY: 131 mg/dL — AB (ref 65–99)
Glucose-Capillary: 168 mg/dL — ABNORMAL HIGH (ref 65–99)
Glucose-Capillary: 96 mg/dL (ref 65–99)
Glucose-Capillary: 135 mg/dL — ABNORMAL HIGH (ref 65–99)

## 2015-06-04 LAB — CBC WITH DIFFERENTIAL/PLATELET
Basophils Absolute: 0 10*3/uL (ref 0.0–0.1)
Basophils Relative: 0 %
EOS ABS: 0.1 10*3/uL (ref 0.0–0.7)
Eosinophils Relative: 2 %
HEMATOCRIT: 31.8 % — AB (ref 36.0–46.0)
HEMOGLOBIN: 10.3 g/dL — AB (ref 12.0–15.0)
LYMPHS ABS: 2.6 10*3/uL (ref 0.7–4.0)
Lymphocytes Relative: 41 %
MCH: 27.8 pg (ref 26.0–34.0)
MCHC: 32.4 g/dL (ref 30.0–36.0)
MCV: 85.7 fL (ref 78.0–100.0)
MONO ABS: 0.4 10*3/uL (ref 0.1–1.0)
MONOS PCT: 6 %
NEUTROS ABS: 3.3 10*3/uL (ref 1.7–7.7)
NEUTROS PCT: 51 %
Platelets: 250 10*3/uL (ref 150–400)
RBC: 3.71 MIL/uL — ABNORMAL LOW (ref 3.87–5.11)
RDW: 14.8 % (ref 11.5–15.5)
WBC: 6.4 10*3/uL (ref 4.0–10.5)

## 2015-06-04 LAB — COMPREHENSIVE METABOLIC PANEL
ALBUMIN: 3.2 g/dL — AB (ref 3.5–5.0)
ALK PHOS: 105 U/L (ref 38–126)
ALT: 10 U/L — AB (ref 14–54)
AST: 16 U/L (ref 15–41)
Anion gap: 10 (ref 5–15)
BUN: 10 mg/dL (ref 6–20)
CALCIUM: 8.6 mg/dL — AB (ref 8.9–10.3)
CHLORIDE: 108 mmol/L (ref 101–111)
CO2: 25 mmol/L (ref 22–32)
CREATININE: 0.81 mg/dL (ref 0.44–1.00)
GFR calc non Af Amer: >60 mL/min (ref >60)
GLUCOSE: 108 mg/dL — AB (ref 65–99)
Potassium: 3 mmol/L — ABNORMAL LOW (ref 3.5–5.1)
SODIUM: 143 mmol/L (ref 135–145)
Total Bilirubin: 0.9 mg/dL (ref 0.3–1.2)
Total Protein: 6.6 g/dL (ref 6.5–8.1)

## 2015-06-04 LAB — BASIC METABOLIC PANEL
ANION GAP: 12 (ref 5–15)
ANION GAP: 15 (ref 5–15)
BUN: 10 mg/dL (ref 6–20)
BUN: 14 mg/dL (ref 6–20)
CALCIUM: 8.7 mg/dL — AB (ref 8.9–10.3)
CALCIUM: 9.1 mg/dL (ref 8.9–10.3)
CHLORIDE: 107 mmol/L (ref 101–111)
CO2: 24 mmol/L (ref 22–32)
CO2: 20 mmol/L — ABNORMAL LOW (ref 22–32)
CREATININE: 0.88 mg/dL (ref 0.44–1.00)
Chloride: 111 mmol/L (ref 101–111)
Creatinine, Ser: 0.77 mg/dL (ref 0.44–1.00)
GFR calc Af Amer: >60 mL/min (ref >60)
GFR calc non Af Amer: >60 mL/min (ref >60)
GLUCOSE: 149 mg/dL — AB (ref 65–99)
GLUCOSE: 110 mg/dL — AB (ref 65–99)
Potassium: 3.1 mmol/L — ABNORMAL LOW (ref 3.5–5.1)
Potassium: 4.3 mmol/L (ref 3.5–5.1)
Sodium: 143 mmol/L (ref 135–145)
Sodium: 146 mmol/L — ABNORMAL HIGH (ref 135–145)

## 2015-06-04 LAB — TROPONIN I: TROPONIN I: 0.03 ng/mL (ref <0.031)

## 2015-06-04 LAB — PROTIME-INR
INR: 1.06 (ref 0.00–1.49)
Prothrombin Time: 14 seconds (ref 11.6–15.2)

## 2015-06-04 LAB — CORTISOL: Cortisol, Plasma: 3.2 ug/dL

## 2015-06-04 LAB — LIPASE, BLOOD: Lipase: 17 U/L (ref 11–51)

## 2015-06-04 LAB — PROCALCITONIN

## 2015-06-04 LAB — APTT: aPTT: 33 seconds (ref 24–37)

## 2015-06-04 LAB — AMYLASE: Amylase: 37 U/L (ref 28–100)

## 2015-06-04 LAB — MRSA PCR SCREENING: MRSA BY PCR: NEGATIVE

## 2015-06-04 LAB — MAGNESIUM: Magnesium: 1.9 mg/dL (ref 1.7–2.4)

## 2015-06-04 LAB — PHOSPHORUS: PHOSPHORUS: 2.8 mg/dL (ref 2.5–4.6)

## 2015-06-04 LAB — BRAIN NATRIURETIC PEPTIDE: B NATRIURETIC PEPTIDE 5: 158.2 pg/mL — AB (ref 0.0–100.0)

## 2015-06-04 LAB — LACTIC ACID, PLASMA: Lactic Acid, Venous: 1.6 mmol/L (ref 0.5–2.0)

## 2015-06-04 MED ORDER — LEVETIRACETAM 750 MG PO TABS
750.0000 mg | ORAL_TABLET | Freq: Two times a day (BID) | ORAL | Status: DC
  Administered 2015-06-04 – 2015-06-05 (×3): 750 mg via ORAL
  Filled 2015-06-04 (×5): qty 1

## 2015-06-04 MED ORDER — ALPRAZOLAM 0.5 MG PO TABS
0.5000 mg | ORAL_TABLET | Freq: Three times a day (TID) | ORAL | Status: DC | PRN
  Administered 2015-06-04: 0.5 mg via ORAL
  Filled 2015-06-04: qty 1

## 2015-06-04 MED ORDER — IPRATROPIUM-ALBUTEROL 0.5-2.5 (3) MG/3ML IN SOLN
3.0000 mL | Freq: Four times a day (QID) | RESPIRATORY_TRACT | Status: DC
  Administered 2015-06-04 – 2015-06-05 (×3): 3 mL via RESPIRATORY_TRACT
  Filled 2015-06-04 (×3): qty 3

## 2015-06-04 MED ORDER — HYDROCODONE-ACETAMINOPHEN 5-325 MG PO TABS
1.0000 | ORAL_TABLET | ORAL | Status: DC | PRN
  Administered 2015-06-04 – 2015-06-05 (×3): 1 via ORAL
  Filled 2015-06-04 (×3): qty 1

## 2015-06-04 MED ORDER — POTASSIUM CHLORIDE CRYS ER 20 MEQ PO TBCR
40.0000 meq | EXTENDED_RELEASE_TABLET | ORAL | Status: AC
  Administered 2015-06-04 (×2): 40 meq via ORAL
  Filled 2015-06-04 (×2): qty 2

## 2015-06-04 NOTE — Progress Notes (Signed)
CSW order in place that patient needs a PCP and does not have access to medications. CSW notified unit RNCM of above and will sign off. Will be available should any CSW needs be identified.  Lorie Phenix. Pauline Good, [place]

## 2015-06-04 NOTE — Progress Notes (Signed)
[place] Progress Note Patient Name: Tina Hayes DOB: [birth year] MRN: [id number]   Date of Service  06/04/2015  HPI/Events of Note  Labs show K 3.0. Normal creatinine & Magnesium. Only 1 PIV w/ Cardene drip running.  eICU Interventions  1. Place second PIV 2. KCl 13mEq x2 doses     Intervention Category Intermediate Interventions: Electrolyte abnormality - evaluation and management  Tera Partridge 06/04/2015, 2:13 AM

## 2015-06-04 NOTE — Progress Notes (Signed)
Pt A/Ox4 steady to [facility], pt refusing bed alarm and states she will call for help if she needs it.

## 2015-06-04 NOTE — H&P (Signed)
PULMONARY / CRITICAL CARE MEDICINE   Name: Tina Hayes MRN: [id number] DOB: 1945-06-08    ADMISSION DATE:  06/03/2015 CONSULTATION DATE:  06/03/2015   REFERRING MD:  [facility] ER  CHIEF COMPLAINT:  Hypertensive Urgency  HISTORY OF PRESENT ILLNESS:   70 year old obese, AA female with COPD NOS, CAD s/p CABG, CHF NOS, DM, BP, Seizures, chronic back pain. Was evicted from her home in [place], [place] and moved to [place], [place] 3 months ago. Reports that at baseline she takes several BP meds and despite that BP runs 140-200s sbp with avg 170-190 sbp. Last 3 months due to access issues and move to [place] has not taken any of her bp meds. Reported to ER at [facility] 06/03/2015 with non specific dyspnea, chest pain, back pain, sore throat, runny nose. She denied chest pain to ER but admits to me. She denied diarhea to ER but reports she has had diarrhea x 2 days bu  Now resolved. Poor historian . BP 248/78 in ER and started on cardene gtt and due to lack of nursing resources at [facility] transffered to [facility]. She is not intubated required nicardipine overnight and came off at 6am 2/6.  SUBJECTIVE: Feels about the same. BP improved this AM. Off Cardene since 0600  VITAL SIGNS: BP 125/87 mmHg  Pulse 67  Temp(Src) 97.3 F (36.3 C) (Oral)  Resp 25  Ht 5\' 8"  (1.727 m)  Wt 135.2 kg (298 lb 1 oz)  BMI 45.33 kg/m2  SpO2 99%  HEMODYNAMICS:    VENTILATOR SETTINGS:    INTAKE / OUTPUT: I/O last 3 completed shifts: In: 210 [I.V.:210] Out: -   PHYSICAL EXAMINATION:  General:  Obese female. No distress. Neuro:  AxOx3. Speech normal . Moves all 4s.  HEENT:  [place]/AT, PERRL, no JVD noted Cardiovascular:  RRR. No murmurs Lungs:  No distress. CTA bialterally though obesity makes it difficult Abdomen:  Obese, soft, non-tender Musculoskeletal:  No cyanopsis. No clubbing. No edema. Back Pain, chronic  Skin:  Intact in exposed areas  LABS:  PULMONARY No results for input(s): PHART, PCO2ART, PO2ART, HCO3, TCO2, O2SAT in  the last 168 hours.  Invalid input(s): PCO2, PO2  CBC  Recent Labs Lab 06/03/15 1513 06/03/15 2352  HGB 10.9* 10.3*  HCT 33.7* 31.8*  WBC 5.2 6.4  PLT 241 250    COAGULATION  Recent Labs Lab 06/03/15 2352  INR 1.06    CARDIAC    Recent Labs Lab 06/03/15 1704 06/03/15 2352 06/04/15 0429  TROPONINI 0.03 <0.03 <0.03   No results for input(s): PROBNP in the last 168 hours.   CHEMISTRY  Recent Labs Lab 06/03/15 1704 06/03/15 2352 06/04/15 0135  NA 143 143 143  K 2.6* 3.0* 3.1*  CL 109 108 107  CO2 25 25 24   GLUCOSE 72 108* 149*  BUN 14 10 10   CREATININE 0.72 0.81 0.77  CALCIUM 9.0 8.6* 8.7*  MG  --  1.9  --   PHOS  --  2.8  --    Estimated Creatinine Clearance: 99.5 mL/min (by C-G formula based on Cr of 0.77).   LIVER  Recent Labs Lab 06/03/15 2352  AST 16  ALT 10*  ALKPHOS 105  BILITOT 0.9  PROT 6.6  ALBUMIN 3.2*  INR 1.06     INFECTIOUS  Recent Labs Lab 06/03/15 2325 06/03/15 2352  LATICACIDVEN 1.6  --   PROCALCITON  --  <0.10     ENDOCRINE CBG (last 3)   Recent Labs  06/04/15 [id number]  06/04/15 0857 06/04/15 1151  GLUCAP 42* 96 95       IMAGING x48h  - image(s) personally visualized  -   highlighted in bold Dg Chest 2 View  06/03/2015  CLINICAL DATA:  Cough and short of breath EXAM: CHEST  2 VIEW COMPARISON:  04/14/2015 FINDINGS: CABG. Mild cardiac enlargement without heart failure or edema. Negative for pneumonia or effusion. Small hiatal hernia. IMPRESSION: No active cardiopulmonary disease. Electronically Signed   By: Franchot Gallo M.D.   On: 06/03/2015 15:26    ASSESSMENT / PLAN:  Hypertensive urgency but could all be relative baseline. Some dyspnea/chest pain but no evidence of encephalopathy or pulmonary edema or renal insuff. Trops neg - access to care issue + Hx of CABG, poorly controlled BP and lack of access to bp meds - baseline meds listed as clonidine, hydralazine, coreg, lisnopril and losartan - cardene  gtt off - Oral meds restarted (losartan, hydralazine), however will hold clonidine for now as SBP 120 without it. Goal should be SBP ~ 170-180 for first 24 hours (until 2200 2/6), then can resume clonidine 2/7 AM - Avoiding home coreg (copd) and lisinopril for now.  COPD without acute exacerbation - Scheduled duoneb, pulmicort  Hypokalemia - repleted in ER at [facility]  Risk for renal insuff  - Repeat BMP this afternoon - Correct electrolytes as indicated  Anemia of chronic disease - Monitor CBC  Diabetes Mellitus    -ssi  H/o of chronic back pain on percocept H/o Seizure - Resume home Keppra - Percocet for pain  Best Practice Diet: heart healthy carb mod diet SUP: IV Protonix VTE ppx: SQ heparin  Transfer to telemetry, to [place]  - Updates: Patient updated at bedside in [place] by [place] 2/6  - Inter-disciplinary family meet or Palliative Care meeting due by:  06/10/15   Georgann Housekeeper, AGACNP-BC [place] Pulmonology/Critical Care Pager [phone] or [phone]  06/04/2015 12:14 PM   Attending Note:  I have examined patient, reviewed labs, studies and notes. I have discussed the case with Jaclynn Guarneri, and I agree with the data and plans as amended above. Nicardipine off since 0600 this am. She is up to chair, BP at goal on oral regimen. Will be critical for her to get a local MD, clarify her new BP regimen and compliance. Will not restart clonidine at this time as her BP has reached goal. Will ask TRH to assume her care as of 2/7.  Baltazar Apo, MD, PhD 06/04/2015, 1:47 PM  Pulmonary and Critical Care [phone] or if no answer [phone]

## 2015-06-04 NOTE — Progress Notes (Signed)
Patient requesting a sleep aid and also is requesting for certain medications that she takes at home, Xanax or Imipramine. Notified hospitalist. Awaiting return page and or new orders.

## 2015-06-04 NOTE — Progress Notes (Signed)
Hypoglycemic Event  CBG: 42  Treatment: 9ml OJ  Symptoms: None  Follow-up CBG: [id number] CBG Result:96  Possible Reasons for Event: NPO over night     Maryjean Morn

## 2015-06-04 NOTE — Progress Notes (Signed)
Pt CBG recheck upon arrival to [place]: CBG 168.

## 2015-06-04 NOTE — Care Management Note (Signed)
Case Management Note  Patient Details  Name: Tina Hayes MRN: [id number] Date of Birth: [birth year]  Subjective/Objective:       Adm w htn urgency             Action/Plan:lives at home   Expected Discharge Date:                  Expected Discharge Plan:     In-House Referral:     Discharge planning Services     Post Acute Care Choice:    Choice offered to:     DME Arranged:    DME Agency:     HH Arranged:    [place] Agency:     Status of Service:     Medicare Important Message Given:    Date Medicare IM Given:    Medicare IM give by:    Date Additional Medicare IM Given:    Additional Medicare Important Message give by:     If discussed at [place] of Stay Meetings, dates discussed:    Additional Comments: ur review done  Lacretia Leigh, RN 06/04/2015, 8:42 AM

## 2015-06-05 DIAGNOSIS — J441 Chronic obstructive pulmonary disease with (acute) exacerbation: Secondary | ICD-10-CM

## 2015-06-05 DIAGNOSIS — R569 Unspecified convulsions: Secondary | ICD-10-CM

## 2015-06-05 LAB — CULTURE, GROUP A STREP (THRC)

## 2015-06-05 LAB — HEMOGLOBIN A1C
HEMOGLOBIN A1C: 8 % — AB (ref 4.8–5.6)
Mean Plasma Glucose: 183 mg/dL

## 2015-06-05 LAB — GLUCOSE, CAPILLARY
Glucose-Capillary: 85 mg/dL (ref 65–99)
Glucose-Capillary: 131 mg/dL — ABNORMAL HIGH (ref 65–99)

## 2015-06-05 MED ORDER — ALBUTEROL SULFATE HFA 108 (90 BASE) MCG/ACT IN AERS: 2.0000 | INHALATION_SPRAY | Freq: Four times a day (QID) | RESPIRATORY_TRACT | Status: DC | PRN

## 2015-06-05 MED ORDER — TIOTROPIUM BROMIDE MONOHYDRATE 18 MCG IN CAPS
18.0000 ug | ORAL_CAPSULE | Freq: Every day | RESPIRATORY_TRACT | Status: DC
  Filled 2015-06-05: qty 5

## 2015-06-05 MED ORDER — LISINOPRIL 10 MG PO TABS: 10.0000 mg | ORAL_TABLET | Freq: Every day | ORAL | Status: DC

## 2015-06-05 MED ORDER — OXYCODONE-ACETAMINOPHEN 5-325 MG PO TABS: 1.0000 | ORAL_TABLET | Freq: Four times a day (QID) | ORAL | Status: DC | PRN

## 2015-06-05 MED ORDER — MELOXICAM 7.5 MG PO TABS: 7.5000 mg | ORAL_TABLET | Freq: Every day | ORAL | Status: DC

## 2015-06-05 MED ORDER — ALPRAZOLAM 0.5 MG PO TABS: 0.5000 mg | ORAL_TABLET | Freq: Every day | ORAL | Status: DC | PRN

## 2015-06-05 MED ORDER — LOSARTAN POTASSIUM 100 MG PO TABS: 100.0000 mg | ORAL_TABLET | Freq: Every day | ORAL | Status: DC

## 2015-06-05 MED ORDER — IMIPRAMINE HCL 50 MG PO TABS: 200.0000 mg | ORAL_TABLET | Freq: Every day | ORAL | Status: DC

## 2015-06-05 MED ORDER — OXYCODONE HCL ER 20 MG PO T12A
40.0000 mg | EXTENDED_RELEASE_TABLET | Freq: Two times a day (BID) | ORAL | Status: DC
  Administered 2015-06-05: 40 mg via ORAL
  Filled 2015-06-05: qty 2

## 2015-06-05 MED ORDER — INSULIN ASPART 100 UNIT/ML ~~LOC~~ SOLN: 0.0000 [IU] | Freq: Three times a day (TID) | SUBCUTANEOUS | Status: DC

## 2015-06-05 MED ORDER — LEVETIRACETAM 750 MG PO TABS: 750.0000 mg | ORAL_TABLET | Freq: Two times a day (BID) | ORAL | Status: DC

## 2015-06-05 MED ORDER — TRAMADOL HCL 50 MG PO TABS: 50.0000 mg | ORAL_TABLET | Freq: Two times a day (BID) | ORAL | Status: DC | PRN

## 2015-06-05 MED ORDER — IMIPRAMINE HCL 50 MG PO TABS
200.0000 mg | ORAL_TABLET | Freq: Every day | ORAL | Status: DC
  Administered 2015-06-05: 200 mg via ORAL
  Filled 2015-06-05: qty 4

## 2015-06-05 MED ORDER — OMEPRAZOLE 40 MG PO CPDR: 40.0000 mg | DELAYED_RELEASE_CAPSULE | Freq: Two times a day (BID) | ORAL | Status: DC

## 2015-06-05 MED ORDER — OXYCODONE HCL ER 60 MG PO T12A: 60.0000 mg | EXTENDED_RELEASE_TABLET | Freq: Two times a day (BID) | ORAL | Status: DC

## 2015-06-05 MED ORDER — CARVEDILOL 25 MG PO TABS: 50.0000 mg | ORAL_TABLET | Freq: Two times a day (BID) | ORAL | Status: DC

## 2015-06-05 MED ORDER — HYDRALAZINE HCL 25 MG PO TABS: 25.0000 mg | ORAL_TABLET | Freq: Two times a day (BID) | ORAL | Status: DC

## 2015-06-05 NOTE — Evaluation (Signed)
Physical Therapy Evaluation Patient Details Name: Tina Hayes MRN: [id number] DOB: [birth year] Today's Date: 06/05/2015   History of Present Illness  Patient is a 70 y/o female with hx of COPD, CAD, HTN, seizures, depression, anxiety, DM and CHF presents with dyspnea, chest pain, back pain, sore throat, runny nose. BP elevated in ED. Admitted for hypertensive urgency.  Clinical Impression  Patient presents with decreased strength, endurance and activity tolerance secondary to DOE and chronic back pain. Tolerated ambulation with Min guard assist for safety requiring 2 standing rest breaks due to fatigue. Encouraged supervision for OOB mobility at home. Pt reports she has 24/7 S at home. Education re: energy conservation techniques. Will follow acutely to maximize independence and mobility prior to return home.     Follow Up Recommendations No PT follow up;Supervision for mobility/OOB    Equipment Recommendations  None recommended by PT    Recommendations for Other Services       Precautions / Restrictions Precautions Precautions: Fall Restrictions Weight Bearing Restrictions: No      Mobility  Bed Mobility Overal bed mobility: Needs Assistance Bed Mobility: Supine to Sit;Sit to Supine     Supine to sit: Modified independent (Device/Increase time);HOB elevated Sit to supine: Modified independent (Device/Increase time);HOB elevated   General bed mobility comments: Increased time. No assist needed.  Transfers Overall transfer level: Needs assistance Equipment used: Rolling walker (2 wheeled) Transfers: Sit to/from Stand Sit to Stand: Min guard         General transfer comment: Min guard for safety. Stood from [employer]. No dizziness.   Ambulation/Gait Ambulation/Gait assistance: Min guard Ambulation Distance (Feet): 125 Feet Assistive device: Rolling walker (2 wheeled) Gait Pattern/deviations: Step-through pattern;Decreased stride length;Trunk flexed   Gait velocity  interpretation: Below normal speed for age/gender General Gait Details: Slow, mostly steady gait using RW. 2 short standing rest breaks. 2/4 DOE. Distance limited due to back pain.  Stairs            Wheelchair Mobility    Modified Rankin (Stroke Patients Only)       Balance Overall balance assessment: Needs assistance;History of Falls Sitting-balance support: No upper extremity supported;Feet supported Sitting balance-Leahy Scale: Good     Standing balance support: During functional activity Standing balance-Leahy Scale: Fair Standing balance comment: Able to stand unsupported for short periods. Reliant on RW for ambulation for safety.                              Pertinent Vitals/Pain Pain Assessment: Faces Faces Pain Scale: Hurts little more Pain Location: chronic back pain. Pain Descriptors / Indicators: Sore Pain Intervention(s): Monitored during session;Repositioned    Home Living Family/patient expects to be discharged to:: Private residence Living Arrangements: Children;Non-relatives/Friends Available Help at Discharge: Family;Friend(s);Available 24 hours/day Type of Home: House Home Access: Level entry     Home Layout: One level Home Equipment: Walker - 2 wheels;Shower seat      Prior Function Level of Independence: Independent with assistive device(s)         Comments: Pt uses RW for ambulation. Does cooking. Has a friend that stays with her during the day. Reports some falls at home.     Hand Dominance        Extremity/Trunk Assessment   Upper Extremity Assessment: Defer to OT evaluation           Lower Extremity Assessment: Generalized weakness         Communication  Communication: No difficulties  Cognition Arousal/Alertness: Awake/alert Behavior During Therapy: WFL for tasks assessed/performed Overall Cognitive Status: Within Functional Limits for tasks assessed                      General Comments       Exercises        Assessment/Plan    PT Assessment Patient needs continued PT services  PT Diagnosis Difficulty walking   PT Problem List Decreased strength;Cardiopulmonary status limiting activity;Decreased balance;Decreased mobility;Decreased activity tolerance  PT Treatment Interventions Balance training;Gait training;Functional mobility training;Therapeutic activities;Therapeutic exercise;Patient/family education   PT Goals (Current goals can be found in the Care Plan section) Acute Rehab PT Goals Patient Stated Goal: to go home  PT Goal Formulation: With patient Time For Goal Achievement: 06/19/15 Potential to Achieve Goals: Good    Frequency Min 3X/week   Barriers to discharge        Co-evaluation               End of Session Equipment Utilized During Treatment: Gait belt Activity Tolerance: Patient tolerated treatment well;Patient limited by fatigue Patient left: in bed;with call bell/phone within reach Nurse Communication: Mobility status         Time: [id number] PT Time Calculation (min) (ACUTE ONLY): 16 min   Charges:   PT Evaluation $PT Eval Moderate Complexity: 1 Procedure     PT G Codes:        Tina Hayes A Tina Hayes 06/05/2015, 1:59 PM Tina Hayes, [place], DPT [phone]

## 2015-06-05 NOTE — Discharge Summary (Signed)
Physician Discharge Summary   Patient ID: Tina Hayes MRN: [id number] DOB/AGE: 68-May-1947 68 y.o.  Admit date: 06/03/2015 Discharge date: 06/05/2015  Primary Care Physician:  No PCP Per Patient  Discharge Diagnoses:     . Hypertensive urgency COPD Coronary artery disease status post CABG Diabetes mellitus History of seizures Chronic back pain Noncompliance  Consults:  Patient was admitted by PCCM  Recommendations for Outpatient Follow-up:  1. Please repeat CBC/BMET at next visit   DIET: Carb modified diet    Allergies:   Allergies  Allergen Reactions  . Aspirin Anaphylaxis     DISCHARGE MEDICATIONS: Current Discharge Medication List    START taking these medications   Details  albuterol (PROVENTIL HFA;VENTOLIN HFA) 108 (90 Base) MCG/ACT inhaler Inhale 2 puffs into the lungs every 6 (six) hours as needed for wheezing or shortness of breath. Qty: 1 Inhaler, Refills: 2    traMADol (ULTRAM) 50 MG tablet Take 1 tablet (50 mg total) by mouth every 12 (twelve) hours as needed for moderate pain. Qty: 30 tablet, Refills: 0      CONTINUE these medications which have CHANGED   Details  ALPRAZolam (XANAX) 0.5 MG tablet Take 1 tablet (0.5 mg total) by mouth daily as needed for anxiety or sleep. Qty: 10 tablet, Refills: 0    hydrALAZINE (APRESOLINE) 25 MG tablet Take 1 tablet (25 mg total) by mouth 2 (two) times daily. Qty: 60 tablet, Refills: 3    imipramine (TOFRANIL) 50 MG tablet Take 4 tablets (200 mg total) by mouth daily. Qty: 120 tablet, Refills: 0    insulin aspart (NOVOLOG) 100 UNIT/ML injection Inject 0-10 Units into the skin 3 (three) times daily with meals. Sliding scale CBG 70 - 120: 0 units CBG 121 - 150: 1 unit,  CBG 151 - 200: 2 units,  CBG 201 - 250: 3 units,  CBG 251 - 300: 5 units,  CBG 301 - 350: 7 units,  CBG 351 - 400: 9 units   CBG > 400: 10 units and notify your MD Qty: 10 mL, Refills: 11    levETIRAcetam (KEPPRA) 750 MG tablet Take 1 tablet (750  mg total) by mouth 2 (two) times daily. Qty: 60 tablet, Refills: 3    losartan (COZAAR) 100 MG tablet Take 1 tablet (100 mg total) by mouth daily. Qty: 30 tablet, Refills: 3    meloxicam (MOBIC) 7.5 MG tablet Take 1 tablet (7.5 mg total) by mouth daily. Qty: 30 tablet, Refills: 1    omeprazole (PRILOSEC) 40 MG capsule Take 1 capsule (40 mg total) by mouth 2 (two) times daily. Qty: 60 capsule, Refills: 3    oxyCODONE-acetaminophen (PERCOCET/ROXICET) 5-325 MG tablet Take 1-2 tablets by mouth every 6 (six) hours as needed for moderate pain or severe pain. Qty: 30 tablet, Refills: 0      CONTINUE these medications which have NOT CHANGED   Details  oxyCODONE (OXYCONTIN) 60 MG 12 hr tablet Take 60 mg by mouth every 12 (twelve) hours.    tiotropium (SPIRIVA) 18 MCG inhalation capsule Place 18 mcg into inhaler and inhale daily.      STOP taking these medications     carvedilol (COREG) 25 MG tablet      cloNIDine (CATAPRES) 0.1 MG tablet      insulin glargine (LANTUS) 100 UNIT/ML injection      lisinopril (PRINIVIL,ZESTRIL) 10 MG tablet          Brief H and P: For complete details please refer to admission H and P,  but in brief70 year old obese, AA female with COPD NOS, CAD s/p CABG, CHF NOS, DM, BP, Seizures, chronic back pain. Was evicted from her home in [place], [place] and moved to [place], [place] 3 months ago. Reports that at baseline she takes several BP meds and despite that BP runs 140-200s sbp with avg 170-190 sbp. Last 3 months due to access issues and move to [place] has not taken any of her bp meds. Reported to ER at [facility] 06/03/2015 with non specific dyspnea, chest pain, back pain, sore throat, runny nose. She denied chest pain to ER but admits to me. She denied diarhea to ER but reports she has had diarrhea x 2 days bu Now resolved. Poor historian . BP 248/78 in ER and started on cardene gtt and due to lack of nursing resources at [facility] transffered to [facility]. She is not intubated  required nicardipine overnight and came off at 6am 2/6.  Hospital Course:     Hypertensive urgency: The patient had recently moved from [place], had run out of all her medications for last 3 months. - Patient was admitted to intensive care unit on a Cardene drip. Patient's BP was 248/78 in ED and was started on Cardene drip. Subsequently BP was improved and patient was started on oral antihypertensives. She was given the prescription of hydralazine and losartan. Avoid Coreg due to COPD and lisinopril.  COPD without any acute exacerbation. Patient was given prescription for albuterol inhaler and continue Spiriva  Hypokalemia - Was replaced in ED at [facility]  Anemia of chronic disease Follow-up patient, currently stable  History of seizures Refilled prescription of Keppra  Chronic back pain Patient was attended to follow up with PCP and obtain pain clinic appointment. She was given prescription for Percocet  Case management was consulted and patient has new appointment scheduled on February 14 at 8:30 AM with [facility] services.   Day of Discharge BP 137/70 mmHg  Pulse 65  Temp(Src) 98 F (36.7 C) (Oral)  Resp 18  Ht 5\' 11"  (1.803 m)  Wt 137.213 kg (302 lb 8 oz)  BMI 42.21 kg/m2  SpO2 100%  Physical Exam: General: Alert and awake oriented x3 not in any acute distress. HEENT: anicteric sclera, pupils reactive to light and accommodation CVS: S1-S2 clear no murmur rubs or gallops Chest: clear to auscultation bilaterally, no wheezing rales or rhonchi Abdomen: soft nontender, nondistended, normal bowel sounds Extremities: no cyanosis, clubbing or edema noted bilaterally Neuro: Cranial nerves II-XII intact, no focal neurological deficits   The results of significant diagnostics from this hospitalization (including imaging, microbiology, ancillary and laboratory) are listed below for reference.    LAB RESULTS: Basic Metabolic Panel:  Recent Labs Lab 06/03/15 2352  06/04/15 0135 06/04/15 1237  NA 143 143 146*  K 3.0* 3.1* 4.3  CL 108 107 111  CO2 25 24 20*  GLUCOSE 108* 149* 110*  BUN 10 10 14   CREATININE 0.81 0.77 0.88  CALCIUM 8.6* 8.7* 9.1  MG 1.9  --   --   PHOS 2.8  --   --    Liver Function Tests:  Recent Labs Lab 06/03/15 2352  AST 16  ALT 10*  ALKPHOS 105  BILITOT 0.9  PROT 6.6  ALBUMIN 3.2*    Recent Labs Lab 06/03/15 2352  LIPASE 17  AMYLASE 37   No results for input(s): AMMONIA in the last 168 hours. CBC:  Recent Labs Lab 06/03/15 1513 06/03/15 2352  WBC 5.2 6.4  NEUTROABS 2.0  3.3  HGB 10.9* 10.3*  HCT 33.7* 31.8*  MCV 83.9 85.7  PLT 241 250   Cardiac Enzymes:  Recent Labs Lab 06/04/15 0429 06/04/15 1055  TROPONINI <0.03 0.03   BNP: Invalid input(s): POCBNP CBG:  Recent Labs Lab 06/05/15 0637 06/05/15 1139  GLUCAP 85 131*    Significant Diagnostic Studies:  Dg Chest 2 View  06/03/2015  CLINICAL DATA:  Cough and short of breath EXAM: CHEST  2 VIEW COMPARISON:  04/14/2015 FINDINGS: CABG. Mild cardiac enlargement without heart failure or edema. Negative for pneumonia or effusion. Small hiatal hernia. IMPRESSION: No active cardiopulmonary disease. Electronically Signed   By: Franchot Gallo M.D.   On: 06/03/2015 15:26       Disposition and Follow-up: Discharge Instructions    Diet Carb Modified    Complete by:  As directed      Increase activity slowly    Complete by:  As directed             DISPOSITION: *Home  DISCHARGE FOLLOW-UP Follow-up Information    Follow up with [facility] On 06/12/2015.   Why:  at 8:30 am; please bring a picture ID and your insurance card. Please try to keep your apt   Contact information:   [address] [place] [phone]        Time spent on Discharge: 25 minutes  Signed:   Jaris Kohles M.D. [facility] 06/05/2015, 1:18 PM Pager: [phone]

## 2015-06-05 NOTE — Progress Notes (Signed)
Orders received for pt discharge.  Discharge summary printed and reviewed with pt.  Explained medication regimen, and pt had no further questions at this time.  IV removed and site remains clean, dry, intact.  Telemetry removed.  Pt in stable condition and awaiting transport. 

## 2015-06-05 NOTE — Care Management Note (Signed)
Case Management Note  Patient Details  Name: Tina Hayes MRN: [id number] Date of Birth: 08/07/1945  Subjective/Objective:         Admitted with HTN urgency           Action/Plan: Patient recently moved from [place], [place] to [facility] to be with her son. Patient stated that she has applied for [facility] Medicaid but does not have her card yet. CM informed patient to call social services to get an update on her Medicaid card. She has Medicare A&B and a prescription savings card for her prescriptions. Pharmacy of choice is [facility]. Patient was scheduled an apt with the [facility] / [facility] January 11,2017- but they have her listed as a not show. Patient was not aware of this apt. A new apt has been scheduled for Feb 14,2017 at 8:30; CM encouraged patient to keep her apt or call to reschedule. She also needs a picture ID. CM informed her to have her son take her to social services / DMV to have this done prior to her apt. 3:1 ordered as requested and will be delivered to the room today prior to discharge home.  Expected Discharge Date:   06/05/2015               Expected Discharge Plan:  Home/Self Care   Discharge planning Services  CM Consult    Choice offered to:  Patient  DME Arranged:  3-N-1 DME Agency:  [name].   Status of Service:  Completed, signed off  Sherrilyn Rist [phone] 06/05/2015, 10:06 AM

## 2015-08-27 ENCOUNTER — Ambulatory Visit: Payer: Medicare Other | Admitting: Anesthesiology

## 2015-08-28 ENCOUNTER — Ambulatory Visit: Payer: Medicare Other | Admitting: Anesthesiology

## 2015-08-28 ENCOUNTER — Encounter: Payer: Self-pay | Admitting: Emergency Medicine

## 2015-08-28 ENCOUNTER — Inpatient Hospital Stay
Admission: EM | Admit: 2015-08-28 | Discharge: 2015-08-30 | DRG: 190 | Disposition: A | Discharge status: Home / Self Care | Payer: Medicare Other | Attending: Internal Medicine | Admitting: Internal Medicine

## 2015-08-28 ENCOUNTER — Telehealth: Payer: Self-pay | Admitting: *Deleted

## 2015-08-28 ENCOUNTER — Emergency Department: Payer: Medicare Other

## 2015-08-28 DIAGNOSIS — R0600 Dyspnea, unspecified: Secondary | ICD-10-CM | POA: Diagnosis present

## 2015-08-28 DIAGNOSIS — Z951 Presence of aortocoronary bypass graft: Secondary | ICD-10-CM | POA: Diagnosis not present

## 2015-08-28 DIAGNOSIS — M549 Dorsalgia, unspecified: Secondary | ICD-10-CM | POA: Diagnosis present

## 2015-08-28 DIAGNOSIS — Z9889 Other specified postprocedural states: Secondary | ICD-10-CM | POA: Diagnosis not present

## 2015-08-28 DIAGNOSIS — J440 Chronic obstructive pulmonary disease with acute lower respiratory infection: Principal | ICD-10-CM | POA: Diagnosis present

## 2015-08-28 DIAGNOSIS — E785 Hyperlipidemia, unspecified: Secondary | ICD-10-CM | POA: Diagnosis present

## 2015-08-28 DIAGNOSIS — I2510 Atherosclerotic heart disease of native coronary artery without angina pectoris: Secondary | ICD-10-CM | POA: Diagnosis present

## 2015-08-28 DIAGNOSIS — A084 Viral intestinal infection, unspecified: Secondary | ICD-10-CM | POA: Diagnosis present

## 2015-08-28 DIAGNOSIS — G8929 Other chronic pain: Secondary | ICD-10-CM | POA: Diagnosis present

## 2015-08-28 DIAGNOSIS — E876 Hypokalemia: Secondary | ICD-10-CM | POA: Diagnosis present

## 2015-08-28 DIAGNOSIS — R0602 Shortness of breath: Secondary | ICD-10-CM

## 2015-08-28 DIAGNOSIS — Z833 Family history of diabetes mellitus: Secondary | ICD-10-CM | POA: Diagnosis not present

## 2015-08-28 DIAGNOSIS — I110 Hypertensive heart disease with heart failure: Secondary | ICD-10-CM | POA: Diagnosis present

## 2015-08-28 DIAGNOSIS — Z79899 Other long term (current) drug therapy: Secondary | ICD-10-CM | POA: Diagnosis not present

## 2015-08-28 DIAGNOSIS — Z8249 Family history of ischemic heart disease and other diseases of the circulatory system: Secondary | ICD-10-CM

## 2015-08-28 DIAGNOSIS — I509 Heart failure, unspecified: Secondary | ICD-10-CM | POA: Diagnosis present

## 2015-08-28 DIAGNOSIS — G40909 Epilepsy, unspecified, not intractable, without status epilepticus: Secondary | ICD-10-CM | POA: Diagnosis present

## 2015-08-28 DIAGNOSIS — J449 Chronic obstructive pulmonary disease, unspecified: Secondary | ICD-10-CM | POA: Diagnosis present

## 2015-08-28 DIAGNOSIS — J441 Chronic obstructive pulmonary disease with (acute) exacerbation: Secondary | ICD-10-CM | POA: Diagnosis present

## 2015-08-28 DIAGNOSIS — J9601 Acute respiratory failure with hypoxia: Secondary | ICD-10-CM | POA: Diagnosis present

## 2015-08-28 DIAGNOSIS — Z87891 Personal history of nicotine dependence: Secondary | ICD-10-CM

## 2015-08-28 DIAGNOSIS — E1165 Type 2 diabetes mellitus with hyperglycemia: Secondary | ICD-10-CM | POA: Diagnosis present

## 2015-08-28 DIAGNOSIS — Z888 Allergy status to other drugs, medicaments and biological substances status: Secondary | ICD-10-CM | POA: Diagnosis not present

## 2015-08-28 DIAGNOSIS — J209 Acute bronchitis, unspecified: Secondary | ICD-10-CM | POA: Diagnosis present

## 2015-08-28 DIAGNOSIS — Z794 Long term (current) use of insulin: Secondary | ICD-10-CM | POA: Diagnosis not present

## 2015-08-28 DIAGNOSIS — F329 Major depressive disorder, single episode, unspecified: Secondary | ICD-10-CM | POA: Diagnosis present

## 2015-08-28 DIAGNOSIS — T380X5A Adverse effect of glucocorticoids and synthetic analogues, initial encounter: Secondary | ICD-10-CM | POA: Diagnosis present

## 2015-08-28 DIAGNOSIS — R0603 Acute respiratory distress: Secondary | ICD-10-CM

## 2015-08-28 LAB — CBC WITH DIFFERENTIAL/PLATELET
Basophils Absolute: 0.1 10*3/uL (ref 0–0.1)
Basophils Relative: 1 %
EOS PCT: 7 %
Eosinophils Absolute: 0.4 10*3/uL (ref 0–0.7)
HCT: 33.4 % — ABNORMAL LOW (ref 35.0–47.0)
Hemoglobin: 10.7 g/dL — ABNORMAL LOW (ref 12.0–16.0)
LYMPHS ABS: 1.8 10*3/uL (ref 1.0–3.6)
LYMPHS PCT: 29 %
MCH: 26 pg (ref 26.0–34.0)
MCHC: 32 g/dL (ref 32.0–36.0)
MCV: 81.4 fL (ref 80.0–100.0)
MONOS PCT: 7 %
Monocytes Absolute: 0.4 10*3/uL (ref 0.2–0.9)
Neutro Abs: 3.6 10*3/uL (ref 1.4–6.5)
Neutrophils Relative %: 56 %
PLATELETS: 224 10*3/uL (ref 150–440)
RBC: 4.1 MIL/uL (ref 3.80–5.20)
RDW: 15.2 % — ABNORMAL HIGH (ref 11.5–14.5)
WBC: 6.4 10*3/uL (ref 3.6–11.0)

## 2015-08-28 LAB — COMPREHENSIVE METABOLIC PANEL
ALBUMIN: 4.2 g/dL (ref 3.5–5.0)
ALK PHOS: 152 U/L — AB (ref 38–126)
ALT: 12 U/L — AB (ref 14–54)
AST: 16 U/L (ref 15–41)
Anion gap: 12 (ref 5–15)
BILIRUBIN TOTAL: 0.5 mg/dL (ref 0.3–1.2)
BUN: 16 mg/dL (ref 6–20)
CALCIUM: 9.1 mg/dL (ref 8.9–10.3)
CO2: 23 mmol/L (ref 22–32)
CREATININE: 0.82 mg/dL (ref 0.44–1.00)
Chloride: 108 mmol/L (ref 101–111)
GFR calc Af Amer: >60 mL/min (ref >60)
Glucose, Bld: 247 mg/dL — ABNORMAL HIGH (ref 65–99)
Potassium: 3.3 mmol/L — ABNORMAL LOW (ref 3.5–5.1)
Sodium: 143 mmol/L (ref 135–145)
TOTAL PROTEIN: 7.8 g/dL (ref 6.5–8.1)

## 2015-08-28 LAB — GLUCOSE, CAPILLARY
GLUCOSE-CAPILLARY: 279 mg/dL — AB (ref 65–99)
GLUCOSE-CAPILLARY: 356 mg/dL — AB (ref 65–99)
Glucose-Capillary: 306 mg/dL — ABNORMAL HIGH (ref 65–99)

## 2015-08-28 LAB — PROTIME-INR
INR: 1.01
PROTHROMBIN TIME: 13.5 s (ref 11.4–15.0)

## 2015-08-28 LAB — LIPASE, BLOOD: LIPASE: 17 U/L (ref 11–51)

## 2015-08-28 LAB — TROPONIN I: Troponin I: <0.03 ng/mL (ref <0.031)

## 2015-08-28 LAB — BRAIN NATRIURETIC PEPTIDE: B Natriuretic Peptide: 31 pg/mL (ref 0.0–100.0)

## 2015-08-28 MED ORDER — ONDANSETRON HCL 4 MG PO TABS: 4.0000 mg | ORAL_TABLET | Freq: Four times a day (QID) | ORAL | Status: DC | PRN

## 2015-08-28 MED ORDER — SODIUM CHLORIDE 0.9% FLUSH
3.0000 mL | Freq: Two times a day (BID) | INTRAVENOUS | Status: DC
  Administered 2015-08-29 – 2015-08-30 (×2): 3 mL via INTRAVENOUS

## 2015-08-28 MED ORDER — LEVOFLOXACIN 500 MG PO TABS
500.0000 mg | ORAL_TABLET | Freq: Every day | ORAL | Status: DC
  Administered 2015-08-28 – 2015-08-30 (×3): 500 mg via ORAL
  Filled 2015-08-28 (×3): qty 1

## 2015-08-28 MED ORDER — ACETAMINOPHEN 325 MG PO TABS
650.0000 mg | ORAL_TABLET | Freq: Four times a day (QID) | ORAL | Status: DC | PRN
Start: 2015-08-28 — End: 2015-08-30

## 2015-08-28 MED ORDER — PANTOPRAZOLE SODIUM 40 MG PO TBEC
40.0000 mg | DELAYED_RELEASE_TABLET | Freq: Every day | ORAL | Status: DC
  Administered 2015-08-28 – 2015-08-30 (×3): 40 mg via ORAL
  Filled 2015-08-28 (×3): qty 1

## 2015-08-28 MED ORDER — LOSARTAN POTASSIUM 50 MG PO TABS
100.0000 mg | ORAL_TABLET | Freq: Every day | ORAL | Status: DC
  Administered 2015-08-29 – 2015-08-30 (×2): 100 mg via ORAL
  Filled 2015-08-28 (×3): qty 2

## 2015-08-28 MED ORDER — TIOTROPIUM BROMIDE MONOHYDRATE 18 MCG IN CAPS
18.0000 ug | ORAL_CAPSULE | Freq: Every day | RESPIRATORY_TRACT | Status: DC
  Administered 2015-08-29: 18 ug via RESPIRATORY_TRACT
  Filled 2015-08-28: qty 5

## 2015-08-28 MED ORDER — TRAZODONE HCL 50 MG PO TABS
50.0000 mg | ORAL_TABLET | Freq: Every day | ORAL | Status: DC
  Administered 2015-08-28 – 2015-08-29 (×2): 50 mg via ORAL
  Filled 2015-08-28 (×2): qty 1

## 2015-08-28 MED ORDER — METHYLPREDNISOLONE SODIUM SUCC 125 MG IJ SOLR
60.0000 mg | Freq: Four times a day (QID) | INTRAMUSCULAR | Status: DC
  Administered 2015-08-28 – 2015-08-29 (×5): 60 mg via INTRAVENOUS
  Filled 2015-08-28 (×5): qty 2

## 2015-08-28 MED ORDER — HYDRALAZINE HCL 25 MG PO TABS
25.0000 mg | ORAL_TABLET | Freq: Two times a day (BID) | ORAL | Status: DC
  Administered 2015-08-28 – 2015-08-30 (×4): 25 mg via ORAL
  Filled 2015-08-28 (×5): qty 1

## 2015-08-28 MED ORDER — ACETAMINOPHEN 650 MG RE SUPP: 650.0000 mg | Freq: Four times a day (QID) | RECTAL | Status: DC | PRN

## 2015-08-28 MED ORDER — MORPHINE SULFATE (PF) 2 MG/ML IV SOLN
1.0000 mg | INTRAVENOUS | Status: DC | PRN
  Administered 2015-08-28 – 2015-08-30 (×3): 1 mg via INTRAVENOUS
  Filled 2015-08-28 (×3): qty 1

## 2015-08-28 MED ORDER — INSULIN ASPART 100 UNIT/ML ~~LOC~~ SOLN
0.0000 [IU] | Freq: Three times a day (TID) | SUBCUTANEOUS | Status: DC
  Administered 2015-08-28: 9 [IU] via SUBCUTANEOUS
  Administered 2015-08-28: 13:00:00 5 [IU] via SUBCUTANEOUS
  Administered 2015-08-29: 7 [IU] via SUBCUTANEOUS
  Administered 2015-08-29 – 2015-08-30 (×2): 9 [IU] via SUBCUTANEOUS
  Filled 2015-08-28: qty 5
  Filled 2015-08-28: qty 7
  Filled 2015-08-28 (×3): qty 9

## 2015-08-28 MED ORDER — METHYLPREDNISOLONE SODIUM SUCC 125 MG IJ SOLR
125.0000 mg | Freq: Once | INTRAMUSCULAR | Status: AC
  Administered 2015-08-28: 125 mg via INTRAVENOUS
  Filled 2015-08-28: qty 2

## 2015-08-28 MED ORDER — GUAIFENESIN ER 600 MG PO TB12
600.0000 mg | ORAL_TABLET | Freq: Two times a day (BID) | ORAL | Status: DC
  Administered 2015-08-28 – 2015-08-30 (×4): 600 mg via ORAL
  Filled 2015-08-28 (×4): qty 1

## 2015-08-28 MED ORDER — CARVEDILOL 12.5 MG PO TABS
50.0000 mg | ORAL_TABLET | Freq: Two times a day (BID) | ORAL | Status: DC
  Administered 2015-08-28 – 2015-08-30 (×4): 50 mg via ORAL
  Filled 2015-08-28 (×4): qty 4

## 2015-08-28 MED ORDER — SERTRALINE HCL 50 MG PO TABS
50.0000 mg | ORAL_TABLET | Freq: Every day | ORAL | Status: DC
  Administered 2015-08-28 – 2015-08-30 (×3): 50 mg via ORAL
  Filled 2015-08-28 (×4): qty 1

## 2015-08-28 MED ORDER — SODIUM CHLORIDE 0.9% FLUSH
3.0000 mL | Freq: Two times a day (BID) | INTRAVENOUS | Status: DC
  Administered 2015-08-28 – 2015-08-30 (×5): 3 mL via INTRAVENOUS

## 2015-08-28 MED ORDER — IPRATROPIUM-ALBUTEROL 0.5-2.5 (3) MG/3ML IN SOLN
3.0000 mL | RESPIRATORY_TRACT | Status: DC
  Administered 2015-08-28 – 2015-08-29 (×9): 3 mL via RESPIRATORY_TRACT
  Filled 2015-08-28 (×9): qty 3

## 2015-08-28 MED ORDER — SODIUM CHLORIDE 0.9 % IV BOLUS (SEPSIS)
500.0000 mL | Freq: Once | INTRAVENOUS | Status: AC
  Administered 2015-08-28: 500 mL via INTRAVENOUS

## 2015-08-28 MED ORDER — ENOXAPARIN SODIUM 40 MG/0.4ML ~~LOC~~ SOLN
40.0000 mg | SUBCUTANEOUS | Status: DC
  Administered 2015-08-28 – 2015-08-29 (×2): 40 mg via SUBCUTANEOUS
  Filled 2015-08-28 (×2): qty 0.4

## 2015-08-28 MED ORDER — ONDANSETRON HCL 4 MG/2ML IJ SOLN: 4.0000 mg | Freq: Four times a day (QID) | INTRAMUSCULAR | Status: DC | PRN

## 2015-08-28 MED ORDER — ATORVASTATIN CALCIUM 20 MG PO TABS
40.0000 mg | ORAL_TABLET | Freq: Every day | ORAL | Status: DC
  Administered 2015-08-28 – 2015-08-29 (×2): 40 mg via ORAL
  Filled 2015-08-28 (×2): qty 2

## 2015-08-28 MED ORDER — SODIUM CHLORIDE 0.9% FLUSH: 3.0000 mL | INTRAVENOUS | Status: DC | PRN

## 2015-08-28 MED ORDER — HYDROCODONE-ACETAMINOPHEN 5-325 MG PO TABS
1.0000 | ORAL_TABLET | ORAL | Status: DC | PRN
  Administered 2015-08-28: 13:00:00 1 via ORAL
  Administered 2015-08-28 – 2015-08-30 (×6): 2 via ORAL
  Filled 2015-08-28 (×4): qty 2
  Filled 2015-08-28: qty 1
  Filled 2015-08-28 (×2): qty 2

## 2015-08-28 MED ORDER — MELOXICAM 7.5 MG PO TABS
7.5000 mg | ORAL_TABLET | Freq: Every day | ORAL | Status: DC
  Administered 2015-08-28 – 2015-08-30 (×3): 7.5 mg via ORAL
  Filled 2015-08-28 (×3): qty 1

## 2015-08-28 MED ORDER — SODIUM CHLORIDE 0.9 % IV SOLN: 250.0000 mL | INTRAVENOUS | Status: DC | PRN

## 2015-08-28 MED ORDER — ALBUTEROL SULFATE (2.5 MG/3ML) 0.083% IN NEBU
5.0000 mg | INHALATION_SOLUTION | Freq: Once | RESPIRATORY_TRACT | Status: AC
  Administered 2015-08-28: 5 mg via RESPIRATORY_TRACT
  Filled 2015-08-28: qty 6

## 2015-08-28 MED ORDER — INSULIN GLARGINE 100 UNIT/ML ~~LOC~~ SOLN
15.0000 [IU] | Freq: Every day | SUBCUTANEOUS | Status: DC
  Administered 2015-08-28: 15 [IU] via SUBCUTANEOUS
  Filled 2015-08-28 (×2): qty 0.15

## 2015-08-28 MED ORDER — POTASSIUM CHLORIDE CRYS ER 20 MEQ PO TBCR
40.0000 meq | EXTENDED_RELEASE_TABLET | Freq: Once | ORAL | Status: AC
  Administered 2015-08-28: 13:00:00 40 meq via ORAL
  Filled 2015-08-28: qty 2

## 2015-08-28 MED ORDER — LEVETIRACETAM 750 MG PO TABS
750.0000 mg | ORAL_TABLET | Freq: Two times a day (BID) | ORAL | Status: DC
  Administered 2015-08-28 – 2015-08-30 (×4): 750 mg via ORAL
  Filled 2015-08-28 (×5): qty 1

## 2015-08-28 NOTE — ED Notes (Signed)
Pt assisted to bsc with 2 nursing assist, walked well, no fall risk noted, pt was very shob upon being placed back in bed, states she has been very shob at home upon walking, assistant did not see sats upon hooking her back up.

## 2015-08-28 NOTE — ED Notes (Signed)
Pt here via EMS from home with c/o shob that began late last night, has been having nausea, vomiting and diarrhea for the past 2 days as well. Upon EMS arrival, sats 97% on RA, however, pt was wheezing and not moving air sufficiently. 2 Nebs given en route, pt wheezing upon ED arrival, however, moving air appropriately, sats, 100% on RA, however, pt working hard to breathe, diaphoretic and resp rate in the 40's. Pt also c/o midsternal cp that began early am, "like something heavy is on her chest." EKG obtained. No vomiting at this time.

## 2015-08-28 NOTE — Telephone Encounter (Signed)
Patient called in @ 5:06am lmv stating that she was very sick and needed to r/s her appt. @ 8:01 am this morning the nurse from the ED called and lmv that the pt is here at the hospital....TD

## 2015-08-28 NOTE — ED Provider Notes (Addendum)
[facility] Emergency Department Provider Note  ____________________________________________   I have reviewed the triage vital signs and the nursing notes.   HISTORY  Chief Complaint Shortness of Breath and Chest Pain    HPI Tina Hayes is a 70 y.o. female with a history of COPD hypertension and CAD noncompliance he states she is actually taking her medications, presents today with shortness of breath and wheeze. Patient states she has been having nausea vomiting and diarrhea with abdominal pain for last couple days but had significant cough and wheeze this morning. Cough is nonproductive. She states to me that she did not have a chest pain that she was in the act of coughing which seems to be different from what she told the triage nurse. Patient doesn't history of being a poor historian according prior notes. In any event she is not suffering from any pain at this moment. She denies any pleuritic chest pain she states is just within painful to cough. The cough has been sometimes productive. There is no radiation of discomfort, it is mild in severity, it is sharp, it is substernal and "everywhere in her chest when she coughs"       Past Medical History  Diagnosis Date  . COPD (chronic obstructive pulmonary disease) ([place])   . Coronary artery disease   . CHF (congestive heart failure) ([place])   . Diabetes mellitus without complication ([place])   . Hypertension   . Seizures ([place])   . Chronic back pain   . Depression   . Anxiety     Patient Active Problem List   Diagnosis Date Noted  . Hypertensive urgency 06/03/2015  . COPD with acute exacerbation ([place]) 04/14/2015    Past Surgical History  Procedure Laterality Date  . Coronary artery bypass graft    . Knee surgery    . Ankle surgery      Current Outpatient Rx  Name  Route  Sig  Dispense  Refill  . albuterol (PROVENTIL HFA;VENTOLIN HFA) 108 (90 Base) MCG/ACT inhaler   Inhalation   Inhale 2 puffs  into the lungs every 6 (six) hours as needed for wheezing or shortness of breath.   1 Inhaler   2   . ALPRAZolam (XANAX) 0.5 MG tablet   Oral   Take 1 tablet (0.5 mg total) by mouth daily as needed for anxiety or sleep.   10 tablet   0   . hydrALAZINE (APRESOLINE) 25 MG tablet   Oral   Take 1 tablet (25 mg total) by mouth 2 (two) times daily.   60 tablet   3   . imipramine (TOFRANIL) 50 MG tablet   Oral   Take 4 tablets (200 mg total) by mouth daily.   120 tablet   0   . insulin aspart (NOVOLOG) 100 UNIT/ML injection   Subcutaneous   Inject 0-10 Units into the skin 3 (three) times daily with meals. Sliding scale CBG 70 - 120: 0 units CBG 121 - 150: 1 unit,  CBG 151 - 200: 2 units,  CBG 201 - 250: 3 units,  CBG 251 - 300: 5 units,  CBG 301 - 350: 7 units,  CBG 351 - 400: 9 units   CBG > 400: 10 units and notify your MD   10 mL   11   . levETIRAcetam (KEPPRA) 750 MG tablet   Oral   Take 1 tablet (750 mg total) by mouth 2 (two) times daily.   60 tablet   3   .  losartan (COZAAR) 100 MG tablet   Oral   Take 1 tablet (100 mg total) by mouth daily.   30 tablet   3   . meloxicam (MOBIC) 7.5 MG tablet   Oral   Take 1 tablet (7.5 mg total) by mouth daily.   30 tablet   1   . omeprazole (PRILOSEC) 40 MG capsule   Oral   Take 1 capsule (40 mg total) by mouth 2 (two) times daily.   60 capsule   3   . oxyCODONE (OXYCONTIN) 60 MG 12 hr tablet   Oral   Take 60 mg by mouth every 12 (twelve) hours.         [name] oxyCODONE-acetaminophen (PERCOCET/ROXICET) 5-325 MG tablet   Oral   Take 1-2 tablets by mouth every 6 (six) hours as needed for moderate pain or severe pain.   30 tablet   0   . tiotropium (SPIRIVA) 18 MCG inhalation capsule   Inhalation   Place 18 mcg into inhaler and inhale daily.         . traMADol (ULTRAM) 50 MG tablet   Oral   Take 1 tablet (50 mg total) by mouth every 12 (twelve) hours as needed for moderate pain.   30 tablet   0      Allergies Aspirin  Family History  Problem Relation Age of Onset  . Diabetes    . Hypertension      Social History Social History  Substance Use Topics  . Smoking status: Former Research scientist (life sciences)  . Smokeless tobacco: Never Used  . Alcohol Use: No    Review of Systems Constitutional: No fever/chills Eyes: No visual changes. ENT: No sore throat. No stiff neck no neck pain Cardiovascular:See history of present illness. Respiratory: Denies shortness of breath. Gastrointestinal:  See history of present illness Genitourinary: Negative for dysuria. Musculoskeletal: Negative lower extremity swelling Skin: Negative for rash. Neurological: Negative for headaches, focal weakness or numbness. 10-point ROS otherwise negative.  ____________________________________________   PHYSICAL EXAM:  VITAL SIGNS: ED Triage Vitals  Enc Vitals Group     BP 08/28/15 0753 128/78 mmHg     Pulse Rate 08/28/15 0753 84     Resp 08/28/15 0753 23     Temp 08/28/15 0803 98.4 F (36.9 C)     Temp Source 08/28/15 0803 Oral     SpO2 08/28/15 0753 63 %     Weight 08/28/15 0803 299 lb (135.626 kg)     Height 08/28/15 0803 5\' 11"  (1.803 m)     Head Cir --      Peak Flow --      Pain Score 08/28/15 0804 3     Pain Loc --      Pain Edu? --      Excl. in [place]? --     Constitutional: Alert and oriented. Patient in moderate respiratory distress changing position on the bed makes her breathe more labored.   Eyes: Conjunctivae are normal. PERRL. EOMI. Head: Atraumatic. Nose: No congestion/rhinnorhea. Mouth/Throat: Mucous membranes are moist.  Oropharynx non-erythematous. Neck: No stridor.   Nontender with no meningismus Cardiovascular: Normal rate, regular rhythm. Grossly normal heart sounds.  Good peripheral circulation. Respiratory: Increased work of breathing, 4-5 word dyspnea diffuse wheeze and no rales or rhonchi diminished in the bases. Abdominal: Soft and nontender. No distention. No guarding no  rebound Back:  There is no focal tenderness or step off there is no midline tenderness there are no lesions noted. there is no CVA  tenderness Musculoskeletal: No lower extremity tenderness. No joint effusions, no DVT signs strong distal pulses no edema Neurologic:  Normal speech and language. No gross focal neurologic deficits are appreciated.  Skin:  Skin is warm, dry and intact. No rash noted. Psychiatric: Mood and affect are normal. Speech and behavior are normal.  ____________________________________________   LABS (all labs ordered are listed, but only abnormal results are displayed)  Labs Reviewed  COMPREHENSIVE METABOLIC PANEL - Abnormal; Notable for the following:    Potassium 3.3 (*)    Glucose, Bld 247 (*)    ALT 12 (*)    Alkaline Phosphatase 152 (*)    All other components within normal limits  CBC WITH DIFFERENTIAL/PLATELET - Abnormal; Notable for the following:    Hemoglobin 10.7 (*)    HCT 33.4 (*)    RDW 15.2 (*)    All other components within normal limits  BLOOD GAS, VENOUS - Abnormal; Notable for the following:    pH, Ven 7.44 (*)    pCO2, Ven 38 (*)    pO2, Ven <31.0 (*)    All other components within normal limits  CULTURE, BLOOD (ROUTINE X 2)  CULTURE, BLOOD (ROUTINE X 2)  LIPASE, BLOOD  TROPONIN I  BRAIN NATRIURETIC PEPTIDE   ____________________________________________  EKG  I personally interpreted any EKGs ordered by me or triage Sinus rhythm at 77 bpm no acute ST elevation or acute ST depression nonspecific ST changes ____________________________________________  RADIOLOGY  I reviewed any imaging ordered by me or triage that were performed during my shift and, if possible, patient and/or family made aware of any abnormal findings. ____________________________________________   PROCEDURES  Procedure(s) performed: None  Critical Care performed: CRITICAL CARE Performed by: Schuyler Amor   Total critical care time: 45  minutes  Critical care time was exclusive of separately billable procedures and treating other patients.  Critical care was necessary to treat or prevent imminent or life-threatening deterioration.  Critical care was time spent personally by me on the following activities: development of treatment plan with patient and/or surrogate as well as nursing, discussions with consultants, evaluation of patient's response to treatment, examination of patient, obtaining history from patient or surrogate, ordering and performing treatments and interventions, ordering and review of laboratory studies, ordering and review of radiographic studies, pulse oximetry and re-evaluation of patient's condition.   ____________________________________________   INITIAL IMPRESSION / ASSESSMENT AND PLAN / ED COURSE  Pertinent labs & imaging results that were available during my care of the patient were reviewed by me and considered in my medical decision making (see chart for details).  Patient with respiratory distress in the context of COPD. No clear evidence of CHF, she does have some atypical chest pain which she associated with her cough. Chest x-ray is reassuring. We did put the patient on BiPAP initially with the extrication of hopefully weaning her off rapidly just offset some of the work of breathing. Patient is much more comfortable this time with this she is able to smile and talk to [place]. The patient does not have evidence of pneumonia on chest x-ray or blood work, her white count is reassuring, this is most consistent with a COPD flare. We have given her Solu-Medrol she has receive nebulizer treatments will try to wean her off the BiPAP her ABG does not show any evidence of CO2 retention or significant acidemia. Patient will be admitted to the hospital.  ----------------------------------------- 10:16 AM on 08/28/2015 -----------------------------------------  Patient with great improvement in symptoms  after  a BiPAP trial, off the BiPAP she is breathing much workup fully with good air movement we will better to the hospital. ____________________________________________   FINAL CLINICAL IMPRESSION(S) / ED DIAGNOSES  Final diagnoses:  SOB (shortness of breath)      This chart was dictated using voice recognition software.  Despite best efforts to proofread,  errors can occur which can change meaning.     Schuyler Amor, MD 08/28/15 0900  Schuyler Amor, MD 08/28/15 1016

## 2015-08-28 NOTE — ED Notes (Signed)
Pt states she has an appointment at the pain clinic today and did not want to be charged $100 for not showing up. Heather, RN called clinic and left a message that pt is being seen in the hospital.

## 2015-08-28 NOTE — H&P (Signed)
[facility] NAME: Tina Hayes    MR#:  [id number]  DATE OF BIRTH:  [birth year]  DATE OF ADMISSION:  08/28/2015  PRIMARY CARE PHYSICIAN:  Dr. Ancil Boozer REQUESTING/REFERRING PHYSICIAN: Schuyler Amor  CHIEF COMPLAINT:   Chief Complaint  Patient presents with  . Shortness of Breath  . Chest Pain    HISTORY OF PRESENT ILLNESS: Tina Hayes  is a 70 y.o. female with a known history of COPD not on any oxygen at home there is concern about medical noncompliance events with shortness of breath since yesterday evening. She reports that this started all of a sudden. She's been having dry cough. She is also having wheezing. Patient initially when arrived in the ED required BiPAP. But now off BiPAP. She is also been having epigastric pain as well as nausea vomiting and diarrhea. That's been going on for past few days. She has not had any fevers or chills . Complains of some substernal discomfort   PAST MEDICAL HISTORY:   Past Medical History  Diagnosis Date  . COPD (chronic obstructive pulmonary disease) ([place])   . Coronary artery disease   . CHF (congestive heart failure) ([place])   . Diabetes mellitus without complication ([place])   . Hypertension   . Seizures ([place])   . Chronic back pain   . Depression   . Anxiety     PAST SURGICAL HISTORY: Past Surgical History  Procedure Laterality Date  . Coronary artery bypass graft    . Knee surgery    . Ankle surgery      SOCIAL HISTORY:  Social History  Substance Use Topics  . Smoking status: Former Research scientist (life sciences)  . Smokeless tobacco: Never Used  . Alcohol Use: No    FAMILY HISTORY:  Family History  Problem Relation Age of Onset  . Diabetes    . Hypertension      DRUG ALLERGIES:  Allergies  Allergen Reactions  . Aspirin Anaphylaxis    REVIEW OF SYSTEMS:   CONSTITUTIONAL: No fever, fatigue or weakness.  EYES: No blurred or double vision.  EARS, NOSE, AND THROAT: No tinnitus or  ear pain.  RESPIRATORY: Dry cough, positive shortness of breath, positive wheezing or hemoptysis.  CARDIOVASCULAR: No chest pain, orthopnea, edema.  GASTROINTESTINAL: Positive nausea, vomiting, diarrhea and abdominal pain.  GENITOURINARY: No dysuria, hematuria.  ENDOCRINE: No polyuria, nocturia,  HEMATOLOGY: No anemia, easy bruising or bleeding SKIN: No rash or lesion. MUSCULOSKELETAL: No joint pain or arthritis.   NEUROLOGIC: No tingling, numbness, weakness.  PSYCHIATRY: No anxiety or depression.   MEDICATIONS AT HOME:  Prior to Admission medications   Medication Sig Start Date End Date Taking? Authorizing Provider  albuterol (PROVENTIL HFA;VENTOLIN HFA) 108 (90 Base) MCG/ACT inhaler Inhale 2 puffs into the lungs every 6 (six) hours as needed for wheezing or shortness of breath. 06/05/15  Yes Ripudeep Krystal Eaton, MD  atorvastatin (LIPITOR) 40 MG tablet Take 40 mg by mouth at bedtime.   Yes Historical Provider, MD  carvedilol (COREG) 25 MG tablet Take 50 mg by mouth 2 (two) times daily with a meal.   Yes Historical Provider, MD  hydrALAZINE (APRESOLINE) 25 MG tablet Take 1 tablet (25 mg total) by mouth 2 (two) times daily. 06/05/15  Yes Ripudeep Krystal Eaton, MD  insulin aspart (NOVOLOG) 100 UNIT/ML injection Inject 0-10 Units into the skin 3 (three) times daily with meals. Sliding scale CBG 70 - 120: 0 units CBG 121 - 150: 1  unit,  CBG 151 - 200: 2 units,  CBG 201 - 250: 3 units,  CBG 251 - 300: 5 units,  CBG 301 - 350: 7 units,  CBG 351 - 400: 9 units   CBG > 400: 10 units and notify your MD 06/05/15  Yes Ripudeep K Rai, MD  insulin glargine (LANTUS) 100 UNIT/ML injection Inject 15 Units into the skin at bedtime.   Yes Historical Provider, MD  levETIRAcetam (KEPPRA) 750 MG tablet Take 1 tablet (750 mg total) by mouth 2 (two) times daily. 06/05/15  Yes Ripudeep Krystal Eaton, MD  losartan (COZAAR) 100 MG tablet Take 1 tablet (100 mg total) by mouth daily. 06/05/15  Yes Ripudeep Krystal Eaton, MD  meloxicam (MOBIC) 7.5 MG tablet  Take 1 tablet (7.5 mg total) by mouth daily. 06/05/15  Yes Ripudeep Krystal Eaton, MD  omeprazole (PRILOSEC) 40 MG capsule Take 1 capsule (40 mg total) by mouth 2 (two) times daily. 06/05/15  Yes Ripudeep Krystal Eaton, MD  sertraline (ZOLOFT) 50 MG tablet Take 50 mg by mouth daily.   Yes Historical Provider, MD  tiotropium (SPIRIVA) 18 MCG inhalation capsule Place 18 mcg into inhaler and inhale daily.   Yes Historical Provider, MD  traZODone (DESYREL) 50 MG tablet Take 50 mg by mouth at bedtime.   Yes Historical Provider, MD      PHYSICAL EXAMINATION:   VITAL SIGNS: Blood pressure 155/82, pulse 72, temperature 98.4 F (36.9 C), temperature source Oral, resp. rate 18, height 5\' 11"  (1.803 m), weight 135.626 kg (299 lb), SpO2 100 %.  GENERAL:  70 y.o.-year-old patient lying in the bed with no acute distress.  EYES: Pupils equal, round, reactive to light and accommodation. No scleral icterus. Extraocular muscles intact.  HEENT: Head atraumatic, normocephalic. Oropharynx and nasopharynx clear.  NECK:  Supple, no jugular venous distention. No thyroid enlargement, no tenderness.  LUNGS: Lateral wheezing throughout both lungs no sensory muscle usage there is no rhonchi or crackles CARDIOVASCULAR: S1, S2 normal. No murmurs, rubs, or gallops.  ABDOMEN: Soft, gastric tenderness, nondistended. Bowel sounds present. No organomegaly or mass.  EXTREMITIES: No pedal edema, cyanosis, or clubbing.  NEUROLOGIC: Cranial nerves II through XII are intact. Muscle strength 5/5 in all extremities. Sensation intact. Gait not checked.  PSYCHIATRIC: The patient is alert and oriented x 3.  SKIN: No obvious rash, lesion, or ulcer.   LABORATORY PANEL:   CBC  Recent Labs Lab 08/28/15 0803  WBC 6.4  HGB 10.7*  HCT 33.4*  PLT 224  MCV 81.4  MCH 26.0  MCHC 32.0  RDW 15.2*  LYMPHSABS 1.8  MONOABS 0.4  EOSABS 0.4  BASOSABS 0.1    ------------------------------------------------------------------------------------------------------------------  Chemistries   Recent Labs Lab 08/28/15 0803  NA 143  K 3.3*  CL 108  CO2 23  GLUCOSE 247*  BUN 16  CREATININE 0.82  CALCIUM 9.1  AST 16  ALT 12*  ALKPHOS 152*  BILITOT 0.5   ------------------------------------------------------------------------------------------------------------------ estimated creatinine clearance is 101.6 mL/min (by C-G formula based on Cr of 0.82). ------------------------------------------------------------------------------------------------------------------ No results for input(s): TSH, T4TOTAL, T3FREE, THYROIDAB in the last 72 hours.  Invalid input(s): FREET3   Coagulation profile No results for input(s): INR, PROTIME in the last 168 hours. ------------------------------------------------------------------------------------------------------------------- No results for input(s): DDIMER in the last 72 hours. -------------------------------------------------------------------------------------------------------------------  Cardiac Enzymes  Recent Labs Lab 08/28/15 0803  TROPONINI <0.03   ------------------------------------------------------------------------------------------------------------------ Invalid input(s): POCBNP  ---------------------------------------------------------------------------------------------------------------  Urinalysis    Component Value Date/Time   COLORURINE YELLOW* 04/14/2015 2008  APPEARANCEUR CLEAR* 04/14/2015 2008   LABSPEC 1.021 04/14/2015 2008   PHURINE 6.0 04/14/2015 2008   GLUCOSEU 50* 04/14/2015 2008   HGBUR 1+* 04/14/2015 2008   [place] NEGATIVE 04/14/2015 2008   [place] NEGATIVE 04/14/2015 2008   PROTEINUR NEGATIVE 04/14/2015 2008   NITRITE NEGATIVE 04/14/2015 2008   LEUKOCYTESUR NEGATIVE 04/14/2015 2008     RADIOLOGY: Dg Chest Port 1 View  08/28/2015  CLINICAL  DATA:  Shortness of breath beginning last night. Also with nausea and vomiting and diarrhea for 2 days. EXAM: PORTABLE CHEST 1 VIEW COMPARISON:  06/03/2015 FINDINGS: Changes from CABG surgery are stable. Cardiac silhouette is mildly enlarged. No mediastinal or hilar masses or evidence of adenopathy. There is evidence of a small hiatal hernia. There are thickened bronchovascular markings, most evident in the lower lungs, unchanged from the prior study. There is no lung consolidation to suggest pneumonia. No evidence of pulmonary edema. Lungs are mildly hyperexpanded. No pleural effusion or pneumothorax. Bony thorax is demineralized but grossly intact. IMPRESSION: 1. No acute cardiopulmonary disease. 2. Mild cardiomegaly. Chronic thickening of the bronchovascular markings bilaterally and mild lung hyperexpansion consistent with COPD. Electronically Signed   By: Lajean Manes M.D.   On: 08/28/2015 08:16    EKG: Orders placed or performed during the hospital encounter of 08/28/15  . ED EKG  . ED EKG  . EKG 12-Lead  . EKG 12-Lead    IMPRESSION AND PLAN: Patient is 70 year old African-American female presents with acute respiratory failure  1. Acute respiratory failure due to acute COPD exasperation off BiPAP now We'll treat for COPD exasperation Treat with Solu-Medrol IV We'll start her on nebulizers every 6 hours scheduled I will also give her oral antibiotics for acute bronchitis  2. Abdominal pain diarrhea We'll check stool for C. difficile and comprehensive stool cultures if symptoms persist and these are negative consider CT of the abdomen  3. Coronary artery disease continue Coreg I will add low-dose aspirin to her current regimen  4. History of CHF currently compensated not on currently therapy  5. Hyperlipidemia unspecified continue Lipitor  6. History of seizure disorder continue Keppra  7. Miscellaneous Lovenox for DVT prophylaxis   ll the records are reviewed and case  discussed with ED provider. Management plans discussed with the patient, family and they are in agreement.  CODE STATUS:    Code Status Orders        Start     Ordered   08/28/15 1034  Full code   Continuous     08/28/15 1034    Code Status History    Date Active Date Inactive Code Status Order ID Comments User Context   06/03/2015 10:38 PM 06/05/2015  8:44 PM Full Code [id number]  Brand Males, MD Inpatient   04/14/2015  5:24 PM 04/15/2015  8:06 PM Full Code [id number]  Dustin Flock, MD Inpatient       TOTAL TIME TAKING CARE OF THIS PATIENT: 55 minutes.    Dustin Flock M.D on 08/28/2015 at 11:18 AM  Between 7am to 6pm - Pager - [phone]  After 6pm go to [email or web address] - password [email or web address] [facility]  [place] [facility]  Office  [phone]  CC: Primary care physician; No PCP Per Patient

## 2015-08-29 LAB — BASIC METABOLIC PANEL
ANION GAP: 11 (ref 5–15)
BUN: 19 mg/dL (ref 6–20)
CALCIUM: 9.2 mg/dL (ref 8.9–10.3)
CO2: 19 mmol/L — AB (ref 22–32)
CREATININE: 0.76 mg/dL (ref 0.44–1.00)
Chloride: 108 mmol/L (ref 101–111)
GFR calc Af Amer: >60 mL/min (ref >60)
GLUCOSE: 320 mg/dL — AB (ref 65–99)
Potassium: 4.1 mmol/L (ref 3.5–5.1)
Sodium: 138 mmol/L (ref 135–145)

## 2015-08-29 LAB — CBC
HEMATOCRIT: 31 % — AB (ref 35.0–47.0)
Hemoglobin: 10.2 g/dL — ABNORMAL LOW (ref 12.0–16.0)
MCH: 26.5 pg (ref 26.0–34.0)
MCHC: 32.8 g/dL (ref 32.0–36.0)
MCV: 80.6 fL (ref 80.0–100.0)
PLATELETS: 209 10*3/uL (ref 150–440)
RBC: 3.85 MIL/uL (ref 3.80–5.20)
RDW: 15 % — AB (ref 11.5–14.5)
WBC: 6 10*3/uL (ref 3.6–11.0)

## 2015-08-29 LAB — GLUCOSE, CAPILLARY
GLUCOSE-CAPILLARY: 477 mg/dL — AB (ref 65–99)
Glucose-Capillary: 314 mg/dL — ABNORMAL HIGH (ref 65–99)
Glucose-Capillary: 395 mg/dL — ABNORMAL HIGH (ref 65–99)
Glucose-Capillary: 269 mg/dL — ABNORMAL HIGH (ref 65–99)

## 2015-08-29 LAB — URINALYSIS COMPLETE WITH MICROSCOPIC (ARMC ONLY)
Bilirubin Urine: NEGATIVE
Glucose, UA: >500 mg/dL — AB
HGB URINE DIPSTICK: NEGATIVE
LEUKOCYTES UA: NEGATIVE
Nitrite: NEGATIVE
PH: 7 (ref 5.0–8.0)
Protein, ur: NEGATIVE mg/dL
SQUAMOUS EPITHELIAL / LPF: NONE SEEN
Specific Gravity, Urine: 1.013 (ref 1.005–1.030)

## 2015-08-29 MED ORDER — ENOXAPARIN SODIUM 40 MG/0.4ML ~~LOC~~ SOLN
40.0000 mg | Freq: Two times a day (BID) | SUBCUTANEOUS | Status: DC
  Administered 2015-08-29 – 2015-08-30 (×2): 40 mg via SUBCUTANEOUS
  Filled 2015-08-29 (×2): qty 0.4

## 2015-08-29 MED ORDER — INSULIN ASPART 100 UNIT/ML ~~LOC~~ SOLN
12.0000 [IU] | Freq: Once | SUBCUTANEOUS | Status: AC
  Administered 2015-08-29: 12 [IU] via SUBCUTANEOUS
  Filled 2015-08-29: qty 12

## 2015-08-29 MED ORDER — METHYLPREDNISOLONE SODIUM SUCC 40 MG IJ SOLR
40.0000 mg | Freq: Four times a day (QID) | INTRAMUSCULAR | Status: DC
  Administered 2015-08-29 – 2015-08-30 (×4): 40 mg via INTRAVENOUS
  Filled 2015-08-29 (×4): qty 1

## 2015-08-29 MED ORDER — INSULIN GLARGINE 100 UNIT/ML ~~LOC~~ SOLN
20.0000 [IU] | Freq: Every day | SUBCUTANEOUS | Status: DC
  Administered 2015-08-29: 20 [IU] via SUBCUTANEOUS
  Filled 2015-08-29 (×2): qty 0.2

## 2015-08-29 MED ORDER — IPRATROPIUM-ALBUTEROL 0.5-2.5 (3) MG/3ML IN SOLN: 3.0000 mL | RESPIRATORY_TRACT | Status: DC | PRN

## 2015-08-29 NOTE — Progress Notes (Signed)
Inpatient Diabetes Program Recommendations  AACE/ADA: New Consensus Statement on Inpatient Glycemic Control (2015)  Target Ranges:  Prepandial:   less than 140 mg/dL      Peak postprandial:   less than 180 mg/dL (1-2 hours)      Critically ill patients:  140 - 180 mg/dL   Review of Glycemic Control Results for LEMMA, TRAINER (MRN [id number]) as of 08/29/2015 09:51  Ref. Range 08/28/2015 11:54 08/28/2015 16:23 08/28/2015 21:31 08/29/2015 07:39  Glucose-Capillary Latest Ref Range: 65-99 mg/dL 279 (H) 356 (H) 306 (H) 314 (H)   Diabetes history: DM type 2 Outpatient Diabetes medications: Lantus 15 units q hs Current orders for Inpatient glycemic control: Lantus 20 units q hs + Novolog correction scale 0-9 units tid  Inpatient Diabetes Program Recommendations:  Please consider adding meal coverage 4-5 units while on steroids and Novolog Correction 0-5 q hs in addition to the tid.  Thank you, Nani Gasser. Navina Wohlers, RN, MSN, CDE Inpatient Glycemic Control Team Team Pager [phone] (8am-5pm) 08/29/2015 10:02 AM

## 2015-08-29 NOTE — Progress Notes (Signed)
   08/29/15 1000  Clinical Encounter Type  Visited With Patient  Visit Type Follow-up  Referral From Nurse  Consult/Referral To Chaplain  Spiritual Encounters  Spiritual Needs Emotional;Prayer  Stress Factors  Patient Stress Factors Exhausted;Family relationships;Major life changes  Visited with patient who expressed positive attitude and appeared much improved from yesterday. She is excited with the possibility of going home tomorrow. Wandra Arthurs. [place]

## 2015-08-29 NOTE — Progress Notes (Signed)
Pharmacy Note - Anticoagulation  Patient with orders for enoxaparin 40mg  SQ Q24H for VTE prophylaxis.  Body mass index is 42.64 kg/(m^2). Estimated Creatinine Clearance: 105.5 mL/min (by C-G formula based on Cr of 0.76).  Adjusted to enoxaparin 40mg  SQ [id number] per policy for BMI > 40 with CrCl > 64ml/min  Rexene Edison, PharmD Clinical Pharmacist   08/29/2015 9:11 AM

## 2015-08-29 NOTE — Progress Notes (Signed)
[place] at [facility]   PATIENT NAME: Tina Hayes    MR#:  [id number]  DATE OF BIRTH:  October 17, 1945  SUBJECTIVE: 70 year old female female patient with COPD admitted for COPD exacerbation. Patient feels much better today and no wheezing, no shortness of breath. Has some cough. No diarrhea or abdominal pain. Placed in C. difficile precautions and isolation but she did not have any further diarrhea or abdominal pain. Tolerating the diet.   CHIEF COMPLAINT:   Chief Complaint  Patient presents with  . Shortness of Breath  . Chest Pain    REVIEW OF SYSTEMS:   ROS CONSTITUTIONAL: No fever, fatigue or weakness.  EYES: No blurred or double vision.  EARS, NOSE, AND THROAT: No tinnitus or ear pain.  RESPIRATORY: has some  cough, no shortness of breath, wheezing or hemoptysis.  CARDIOVASCULAR: No chest pain, orthopnea, edema.  GASTROINTESTINAL: No nausea, vomiting, diarrhea or abdominal pain.  GENITOURINARY: No dysuria, hematuria.  ENDOCRINE: No polyuria, nocturia,  HEMATOLOGY: No anemia, easy bruising or bleeding SKIN: No rash or lesion. MUSCULOSKELETAL: No joint pain or arthritis.   NEUROLOGIC: No tingling, numbness, weakness.  PSYCHIATRY: No anxiety or depression.   DRUG ALLERGIES:   Allergies  Allergen Reactions  . Aspirin Anaphylaxis    VITALS:  Blood pressure 160/71, pulse 64, temperature 98 F (36.7 C), temperature source Oral, resp. rate 22, height 5\' 11"  (1.803 m), weight 138.619 kg (305 lb 9.6 oz), SpO2 100 %.  PHYSICAL EXAMINATION:  GENERAL:  70 y.o.-year-old patient lying in the bed with no acute distress.  EYES: Pupils equal, round, reactive to light and accommodation. No scleral icterus. Extraocular muscles intact.  HEENT: Head atraumatic, normocephalic. Oropharynx and nasopharynx clear.  NECK:  Supple, no jugular venous distention. No thyroid enlargement, no tenderness.  LUNGS: Normal breath sounds bilaterally, no wheezing,  rales,rhonchi or crepitation. No use of accessory muscles of respiration.  CARDIOVASCULAR: S1, S2 normal. No murmurs, rubs, or gallops.  ABDOMEN: Soft, nontender, nondistended. Bowel sounds present. No organomegaly or mass.  EXTREMITIES: No pedal edema, cyanosis, or clubbing.  NEUROLOGIC: Cranial nerves II through XII are intact. Muscle strength 5/5 in all extremities. Sensation intact. Gait not checked.  PSYCHIATRIC: The patient is alert and oriented x 3.  SKIN: No obvious rash, lesion, or ulcer.    LABORATORY PANEL:   CBC  Recent Labs Lab 08/29/15 0419  WBC 6.0  HGB 10.2*  HCT 31.0*  PLT 209   ------------------------------------------------------------------------------------------------------------------  Chemistries   Recent Labs Lab 08/28/15 0803 08/29/15 0419  NA 143 138  K 3.3* 4.1  CL 108 108  CO2 23 19*  GLUCOSE 247* 320*  BUN 16 19  CREATININE 0.82 0.76  CALCIUM 9.1 9.2  AST 16  --   ALT 12*  --   ALKPHOS 152*  --   BILITOT 0.5  --    ------------------------------------------------------------------------------------------------------------------  Cardiac Enzymes  Recent Labs Lab 08/28/15 2211  TROPONINI <0.03   ------------------------------------------------------------------------------------------------------------------  RADIOLOGY:  Dg Chest Port 1 View  08/28/2015  CLINICAL DATA:  Shortness of breath beginning last night. Also with nausea and vomiting and diarrhea for 2 days. EXAM: PORTABLE CHEST 1 VIEW COMPARISON:  06/03/2015 FINDINGS: Changes from CABG surgery are stable. Cardiac silhouette is mildly enlarged. No mediastinal or hilar masses or evidence of adenopathy. There is evidence of a small hiatal hernia. There are thickened bronchovascular markings, most evident in the lower lungs, unchanged from the prior study. There is no lung consolidation to suggest  pneumonia. No evidence of pulmonary edema. Lungs are mildly hyperexpanded. No  pleural effusion or pneumothorax. Bony thorax is demineralized but grossly intact. IMPRESSION: 1. No acute cardiopulmonary disease. 2. Mild cardiomegaly. Chronic thickening of the bronchovascular markings bilaterally and mild lung hyperexpansion consistent with COPD. Electronically Signed   By: Lajean Manes M.D.   On: 08/28/2015 08:16    EKG:   Orders placed or performed during the hospital encounter of 08/28/15  . ED EKG  . ED EKG  . EKG 12-Lead  . EKG 12-Lead    ASSESSMENT AND PLAN:   #1 acute respiratory failure with hypoxia on arrival due to COPD exacerbation ;required BiPAP initially but now she is off BiPAP, shortness of breath improved. On room air saturations at 100%. Patient has no further wheezing. Admitted for COPD exacerbation, clinically improving. Continue Levaquin, decreased IV Solu-Medrol dose, DuoNeb's. Likely discharge tomorrow with tapering dose of prednisone. #2 abdominal pain and diarrhea: Likely secondary to viral gastroenteritis: No further diarrhea or abdominal pain. If patient gets the diarrhea can proceed further with discontinue isolation. #3 history of seizure disorder: Continue Keppra. #4 diabetes mellitus type 2: Elevated blood sugar secondary to steroids: Increase the Lantus dose temporarily while she is on steroids. #5. essential hypertension: Controlled, continue losartan, Coreg #6 depression: Continue Zoloft  7 .hypokalemia improved with replacement.   All the records are reviewed and case discussed with Care Management/Social Workerr. Management plans discussed with the patient, family and they are in agreement.  CODE STATUS: full  TOTAL TIME TAKING CARE OF THIS PATIENT: 14minutes.   POSSIBLE D/C IN 1-2 DAYS, DEPENDING ON CLINICAL CONDITION.   Epifanio Lesches M.D on 08/29/2015 at 9:19 AM  Between 7am to 6pm - Pager - [phone]  After 6pm go to [email or web address] - password [email or web address] [facility]  [place] [facility]  Office   [phone]  CC: Primary care physician; No PCP Per Patient   Note: This dictation was prepared with Dragon dictation along with smaller phrase technology. Any transcriptional errors that result from this process are unintentional.

## 2015-08-29 NOTE — Care Management (Signed)
Presented to [facility] with the diagnosis of COPD.Moved from [place] 4 months ago. Son, Beverely Low, lives with her [phone]). Pandora Leiter [place] [phone]) Last seen [facility] [place] 3 weeks ago. Home Health 3 years ago. Doesn't remember name of agency. No skilled facility. No home oxygen. Takes care of all basic and instrumental activities herself, doesn't drive. [place] about a week ago. States she has an old bedside commode in the home, would like another one, if possible. States she has a rolling walker. Son will transport. Shelbie Ammons RN MSN CCM Care Management [phone]

## 2015-08-29 NOTE — Progress Notes (Signed)
   08/28/15 1500  Clinical Encounter Type  Visited With Patient  Visit Type Initial  Referral From Nurse  Consult/Referral To Chaplain  Spiritual Encounters  Spiritual Needs Emotional;Prayer;Sacred text  Stress Factors  Patient Stress Factors Exhausted;Family relationships;Health changes;Major life changes  Visited patient who was extremely lonely and weeping. Provided pastoral care and prayer. Patient's attitude was greatly improved before I departed. Chap. Darian Cansler G. [place]

## 2015-08-30 LAB — GLUCOSE, CAPILLARY
Glucose-Capillary: 359 mg/dL — ABNORMAL HIGH (ref 65–99)
Glucose-Capillary: 476 mg/dL — ABNORMAL HIGH (ref 65–99)
Glucose-Capillary: 518 mg/dL — ABNORMAL HIGH (ref 65–99)

## 2015-08-30 MED ORDER — INSULIN GLARGINE 100 UNIT/ML ~~LOC~~ SOLN: 20.0000 [IU] | Freq: Every day | SUBCUTANEOUS | Status: DC

## 2015-08-30 MED ORDER — PREDNISONE 10 MG (21) PO TBPK: 10.0000 mg | ORAL_TABLET | Freq: Every day | ORAL | Status: DC

## 2015-08-30 MED ORDER — INSULIN ASPART 100 UNIT/ML ~~LOC~~ SOLN
12.0000 [IU] | Freq: Once | SUBCUTANEOUS | Status: AC
  Administered 2015-08-30: 12 [IU] via SUBCUTANEOUS
  Filled 2015-08-30: qty 12

## 2015-08-30 MED ORDER — ALBUTEROL SULFATE HFA 108 (90 BASE) MCG/ACT IN AERS: 2.0000 | INHALATION_SPRAY | Freq: Four times a day (QID) | RESPIRATORY_TRACT | Status: DC | PRN

## 2015-08-30 MED ORDER — INSULIN GLARGINE 100 UNIT/ML ~~LOC~~ SOLN
35.0000 [IU] | Freq: Every day | SUBCUTANEOUS | Status: DC
  Filled 2015-08-30 (×2): qty 0.35

## 2015-08-30 MED ORDER — LEVOFLOXACIN 500 MG PO TABS: 500.0000 mg | ORAL_TABLET | Freq: Every day | ORAL | Status: DC

## 2015-08-30 MED ORDER — INSULIN ASPART 100 UNIT/ML ~~LOC~~ SOLN: 7.0000 [IU] | Freq: Three times a day (TID) | SUBCUTANEOUS | Status: DC

## 2015-08-30 NOTE — Progress Notes (Signed)
   08/30/15 1030  Clinical Encounter Type  Visited With Patient  Visit Type Follow-up  Consult/Referral To Chaplain  Spiritual Encounters  Spiritual Needs Prayer  Stress Factors  Patient Stress Factors Health changes  Met w/patient during routine rounds. Provided pastoral care & prayer. Chap. Bowman Higbie G. [place]

## 2015-08-30 NOTE — Progress Notes (Signed)
[place] at [facility]   PATIENT NAME: Tina Hayes    MR#:  [id number]  DATE OF BIRTH:  1945/07/12  SUBJECTIVE: 70 year old female female patient with COPD admitted for COPD exacerbation. Patient feels much better today and no wheezing, and unable to discharge the patient because of elevated blood sugar more than 500.   CHIEF COMPLAINT:   Chief Complaint  Patient presents with  . Shortness of Breath  . Chest Pain    REVIEW OF SYSTEMS:   ROS CONSTITUTIONAL: No fever, fatigue or weakness.  EYES: No blurred or double vision.  EARS, NOSE, AND THROAT: No tinnitus or ear pain.  RESPIRATORY: has some  cough, no shortness of breath, wheezing or hemoptysis.  CARDIOVASCULAR: No chest pain, orthopnea, edema.  GASTROINTESTINAL: No nausea, vomiting, diarrhea or abdominal pain.  GENITOURINARY: No dysuria, hematuria.  ENDOCRINE: No polyuria, nocturia,  HEMATOLOGY: No anemia, easy bruising or bleeding SKIN: No rash or lesion. MUSCULOSKELETAL: No joint pain or arthritis.   NEUROLOGIC: No tingling, numbness, weakness.  PSYCHIATRY: No anxiety or depression.   DRUG ALLERGIES:   Allergies  Allergen Reactions  . Aspirin Anaphylaxis    VITALS:  Blood pressure 142/80, pulse 61, temperature 98.9 F (37.2 C), temperature source Oral, resp. rate 16, height 5\' 11"  (1.803 m), weight 138.619 kg (305 lb 9.6 oz), SpO2 98 %.  PHYSICAL EXAMINATION:  GENERAL:  70 y.o.-year-old patient lying in the bed with no acute distress.  EYES: Pupils equal, round, reactive to light and accommodation. No scleral icterus. Extraocular muscles intact.  HEENT: Head atraumatic, normocephalic. Oropharynx and nasopharynx clear.  NECK:  Supple, no jugular venous distention. No thyroid enlargement, no tenderness.  LUNGS: Normal breath sounds bilaterally, no wheezing, rales,rhonchi or crepitation. No use of accessory muscles of respiration.  CARDIOVASCULAR: S1, S2 normal. No murmurs, rubs,  or gallops.  ABDOMEN: Soft, nontender, nondistended. Bowel sounds present. No organomegaly or mass.  EXTREMITIES: No pedal edema, cyanosis, or clubbing.  NEUROLOGIC: Cranial nerves II through XII are intact. Muscle strength 5/5 in all extremities. Sensation intact. Gait not checked.  PSYCHIATRIC: The patient is alert and oriented x 3.  SKIN: No obvious rash, lesion, or ulcer.    LABORATORY PANEL:   CBC  Recent Labs Lab 08/29/15 0419  WBC 6.0  HGB 10.2*  HCT 31.0*  PLT 209   ------------------------------------------------------------------------------------------------------------------  Chemistries   Recent Labs Lab 08/28/15 0803 08/29/15 0419  NA 143 138  K 3.3* 4.1  CL 108 108  CO2 23 19*  GLUCOSE 247* 320*  BUN 16 19  CREATININE 0.82 0.76  CALCIUM 9.1 9.2  AST 16  --   ALT 12*  --   ALKPHOS 152*  --   BILITOT 0.5  --    ------------------------------------------------------------------------------------------------------------------  Cardiac Enzymes  Recent Labs Lab 08/28/15 2211  TROPONINI <0.03   ------------------------------------------------------------------------------------------------------------------  RADIOLOGY:  No results found.  EKG:   Orders placed or performed during the hospital encounter of 08/28/15  . ED EKG  . ED EKG  . EKG 12-Lead  . EKG 12-Lead    ASSESSMENT AND PLAN:   #1 acute respiratory failure with hypoxia on arrival due to COPD exacerbation ;required BiPAP initially but now she is off BiPAP, shortness of breath improved. On room air saturations at 100%. Patient has no further wheezing. Admitted for COPD exacerbation, clinically improving. Continue Levaquin, decreased IV Solu-Medrol dose, DuoNeb's. Discharge canceled because of hyperglycemia with blood sugar more than 500 and not safe for discharge. #  2 abdominal pain and diarrhea: Likely secondary to viral gastroenteritis: No further diarrhea or abdominal pain. If  patient gets the diarrhea can proceed further with discontinue isolation. #3 history of seizure disorder: Continue Keppra. #4 diabetes mellitus type 2: Elevated blood sugar secondary to steroids: Adjusted the Lantus., added NovoLog 7 units 3 times a day. Continue to watch in the hospital due to severe hyperglycemia.  #5. essential hypertension: Controlled, continue losartan, Coreg #6 depression: Continue Zoloft  7 .hypokalemia improved with replacement.   All the records are reviewed and case discussed with Care Management/Social Workerr. Management plans discussed with the patient, family and they are in agreement.  CODE STATUS: full  TOTAL TIME TAKING CARE OF THIS PATIENT: 36minutes.   POSSIBLE D/C IN 1-2 DAYS, DEPENDING ON CLINICAL CONDITION.   Epifanio Lesches M.D on 08/30/2015 at 1:33 PM  Between 7am to 6pm - Pager - [phone]  After 6pm go to [email or web address] - password [email or web address] [facility]  [place] [facility]  Office  [phone]  CC: Primary care physician; No PCP Per Patient   Note: This dictation was prepared with Dragon dictation along with smaller phrase technology. Any transcriptional errors that result from this process are unintentional.

## 2015-08-30 NOTE — Care Management Important Message (Signed)
Important Message  Patient Details  Name: Tina Hayes MRN: [id number] Date of Birth: 02/03/1946   Medicare Important Message Given:  Yes    Shelbie Ammons, RN 08/30/2015, 11:36 AM

## 2015-08-30 NOTE — Progress Notes (Signed)
Pt is alert and oriented x 4, ambulatory in room, on room air, pain managed with morphine and norco, pt is d/c to home, rx sent to [facility] on [address], reviewed d/c instructions with patient, pt verbalizes understanding of d/c instructions and has no further questions at this time. Son providing ride.

## 2015-08-30 NOTE — Care Management (Signed)
Discharge to home today per Dr. Dian Queen. Requested bedside commode.  Will update [facility] representative. Son will transport. Shelbie Ammons RN MSN CCM Care Management [phone]

## 2015-08-30 NOTE — Progress Notes (Addendum)
Inpatient Diabetes Program Recommendations  AACE/ADA: New Consensus Statement on Inpatient Glycemic Control (2015)  Target Ranges:  Prepandial:   less than 140 mg/dL      Peak postprandial:   less than 180 mg/dL (1-2 hours)      Critically ill patients:  140 - 180 mg/dL   Review of Glycemic Control  Results for Tina Hayes, Tina Hayes (MRN [id number]) as of 08/30/2015 13:14  Ref. Range 08/29/2015 16:36 08/29/2015 21:44 08/30/2015 07:59 08/30/2015 11:19 08/30/2015 11:22  Glucose-Capillary Latest Ref Range: 65-99 mg/dL 395 (H) 269 (H) 359 (H) 476 (H) 518 (H)    Diabetes history: DM type 2 Outpatient Diabetes medications: Lantus 15 units q hs Current orders for Inpatient glycemic control: Lantus 20 units q hs + Novolog correction scale 0-9 units tid  Inpatient Diabetes Program Recommendations: Please increase Lantus to 35 units starting now (0.25units/kg),  adding Novolog 7units tid with meals, increasing Novolog correction to moderate correction 0-15 units tid and adding Novolog correction 0-5 q hs (decrease insulin as steroids are tapered)  Gentry Fitz, RN, BA, MHA, CDE Diabetes Coordinator Inpatient Diabetes Program  [phone] (Team Pager) [phone] ([place]) 08/30/2015 1:17 PM

## 2015-08-31 LAB — BLOOD CULTURE ID PANEL (REFLEXED)
ACINETOBACTER BAUMANNII: NOT DETECTED
CANDIDA ALBICANS: NOT DETECTED
CANDIDA PARAPSILOSIS: NOT DETECTED
CANDIDA TROPICALIS: NOT DETECTED
Candida glabrata: NOT DETECTED
Candida krusei: NOT DETECTED
Carbapenem resistance: NOT DETECTED
ENTEROBACTER CLOACAE COMPLEX: NOT DETECTED
ENTEROBACTERIACEAE SPECIES: NOT DETECTED
ESCHERICHIA COLI: NOT DETECTED
Enterococcus species: NOT DETECTED
HAEMOPHILUS INFLUENZAE: NOT DETECTED
Klebsiella oxytoca: NOT DETECTED
Klebsiella pneumoniae: NOT DETECTED
LISTERIA MONOCYTOGENES: NOT DETECTED
METHICILLIN RESISTANCE: NOT DETECTED
Neisseria meningitidis: NOT DETECTED
Proteus species: NOT DETECTED
Pseudomonas aeruginosa: NOT DETECTED
STAPHYLOCOCCUS AUREUS BCID: NOT DETECTED
STAPHYLOCOCCUS SPECIES: NOT DETECTED
STREPTOCOCCUS PNEUMONIAE: NOT DETECTED
Serratia marcescens: NOT DETECTED
Streptococcus agalactiae: NOT DETECTED
Streptococcus pyogenes: NOT DETECTED
Streptococcus species: NOT DETECTED
VANCOMYCIN RESISTANCE: NOT DETECTED

## 2015-08-31 NOTE — Discharge Summary (Signed)
Cori Alexie, is a 70 y.o. female  DOB December 02, 1945  MRN [id number].  Admission date:  08/28/2015  Admitting Physician  Dustin Flock, MD  Discharge Date:  08/30/2015   Primary MD  No PCP Per Patient  Recommendations for primary care physician for things to follow:  Patient follow-up with primary doctor Dr. Etter Sjogren in one week  Admission Diagnosis  Respiratory distress [R06.00] SOB (shortness of breath) [R06.02]   Discharge Diagnosis  Respiratory distress [R06.00] SOB (shortness of breath) [R06.02]    Active Problems:   COPD exacerbation (HCC)      Past Medical History  Diagnosis Date  . COPD (chronic obstructive pulmonary disease) ([place])   . Coronary artery disease   . CHF (congestive heart failure) ([place])   . Diabetes mellitus without complication ([place])   . Hypertension   . Seizures ([place])   . Chronic back pain   . Depression   . Anxiety     Past Surgical History  Procedure Laterality Date  . Coronary artery bypass graft    . Knee surgery    . Ankle surgery         History of present illness and  Hospital Course:     Kindly see H&P for history of present illness and admission details, please review complete Labs, Consult reports and Test reports for all details in brief  HPI  from the history and physical done on the day of admission 70 year old female patient with a history of COPD admitted because of shortness of breath, hypoxia patient required BiPAP initially in the emergency room. Admitted to hospitalist service for acute respiratory failure with hypoxia due to COPD exacerbation.   Hospital Course  #1 acute respiratory failure with hypoxia due to COPD exacerbation: Patient did have a lot of wheezing on admission include with IV steroids, nebulizers, antibiotics. X-ray did not show any pneumonia.  Her saturations went up to 100% on room air at the time of discharge. Charge home with tapering course of prednisone, Levaquin, albuterol inhaler. 2 diabetes mellitus type 2: Patient had hyperglycemia secondary to steroid use. Glucose up to as high as 500. increased  the Lantus to 20 units daily at bedtime, from 15 units that she takes at home, so advised to continue NovoLog correction scale as well. Insisted to be discharged before rechecking BG> #3. Facial hypertension: Continue Coreg, hydralazine. #4 history of seizure disorder: Patient is on Keppra. #5 depression patient is on Zoloft, we continued that.  Discharge Condition: stable   Follow UP  Follow-up Information    Follow up with Sabino Snipes KEY, MD On 09/05/2015.   Specialty:  Family Medicine   Why:  at 10:00   Contact information:   [address] [place] [address] [phone]         Discharge Instructions  and  Discharge Medications        Medication List    TAKE these medications        albuterol 108 (90 Base) MCG/ACT inhaler  Commonly known as:  PROVENTIL HFA;VENTOLIN HFA  Inhale 2 puffs into the lungs every 6 (six) hours as needed for wheezing or shortness of breath.     albuterol 108 (90 Base) MCG/ACT inhaler  Commonly known as:  PROVENTIL HFA;VENTOLIN HFA  Inhale 2 puffs into the lungs every 6 (six) hours as needed for wheezing or shortness of breath.     atorvastatin 40 MG tablet  Commonly known as:  LIPITOR  Take 40 mg  by mouth at bedtime.     carvedilol 25 MG tablet  Commonly known as:  COREG  Take 50 mg by mouth 2 (two) times daily with a meal.     hydrALAZINE 25 MG tablet  Commonly known as:  APRESOLINE  Take 1 tablet (25 mg total) by mouth 2 (two) times daily.     insulin aspart 100 UNIT/ML injection  Commonly known as:  novoLOG  Inject 0-10 Units into the skin 3 (three) times daily with meals. Sliding scale CBG 70 - 120: 0 units CBG 121 - 150: 1 unit,  CBG 151 - 200: 2  units,  CBG 201 - 250: 3 units,  CBG 251 - 300: 5 units,  CBG 301 - 350: 7 units,  CBG 351 - 400: 9 units   CBG > 400: 10 units and notify your MD     insulin glargine 100 UNIT/ML injection  Commonly known as:  LANTUS  Inject 0.2 mLs (20 Units total) into the skin at bedtime.     levETIRAcetam 750 MG tablet  Commonly known as:  KEPPRA  Take 1 tablet (750 mg total) by mouth 2 (two) times daily.     levofloxacin 500 MG tablet  Commonly known as:  LEVAQUIN  Take 1 tablet (500 mg total) by mouth daily.     losartan 100 MG tablet  Commonly known as:  COZAAR  Take 1 tablet (100 mg total) by mouth daily.     meloxicam 7.5 MG tablet  Commonly known as:  MOBIC  Take 1 tablet (7.5 mg total) by mouth daily.     omeprazole 40 MG capsule  Commonly known as:  PRILOSEC  Take 1 capsule (40 mg total) by mouth 2 (two) times daily.     predniSONE 10 MG (21) Tbpk tablet  Commonly known as:  STERAPRED UNI-PAK 21 TAB  Take 1 tablet (10 mg total) by mouth daily. Taper by 10 mg daily     sertraline 50 MG tablet  Commonly known as:  ZOLOFT  Take 50 mg by mouth daily.     tiotropium 18 MCG inhalation capsule  Commonly known as:  SPIRIVA  Place 18 mcg into inhaler and inhale daily.     traMADol 50 MG tablet  Commonly known as:  ULTRAM  Take 50 mg by mouth every 6 (six) hours as needed for moderate pain.     traZODone 50 MG tablet  Commonly known as:  DESYREL  Take 50 mg by mouth at bedtime.          Diet and Activity recommendation: See Discharge Instructions above   Consults obtained -diabetes coordinator   Major procedures and Radiology Reports - PLEASE review detailed and final reports for all details, in brief -      Dg Chest Port 1 View  08/28/2015  CLINICAL DATA:  Shortness of breath beginning last night. Also with nausea and vomiting and diarrhea for 2 days. EXAM: PORTABLE CHEST 1 VIEW COMPARISON:  06/03/2015 FINDINGS: Changes from CABG surgery are stable. Cardiac silhouette  is mildly enlarged. No mediastinal or hilar masses or evidence of adenopathy. There is evidence of a small hiatal hernia. There are thickened bronchovascular markings, most evident in the lower lungs, unchanged from the prior study. There is no lung consolidation to suggest pneumonia. No evidence of pulmonary edema. Lungs are mildly hyperexpanded. No pleural effusion or pneumothorax. Bony thorax is demineralized but grossly intact. IMPRESSION: 1. No acute cardiopulmonary disease. 2. Mild cardiomegaly. Chronic thickening of  the bronchovascular markings bilaterally and mild lung hyperexpansion consistent with COPD. Electronically Signed   By: Lajean Manes M.D.   On: 08/28/2015 08:16    Micro Results    Recent Results (from the past 240 hour(s))  Culture, blood (routine x 2)     Status: None (Preliminary result)   Collection Time: 08/28/15  8:03 AM  Result Value Ref Range Status   Specimen Description BLOOD RIGHT AC  Final   Special Requests BOTTLES DRAWN AEROBIC AND ANAEROBIC 1ML  Final   Culture  Setup Time   Final    GRAM POSITIVE RODS AEROBIC BOTTLE ONLY CRITICAL RESULT CALLED TO, READ BACK BY AND VERIFIED WITH: JASON ROBBINS at 2120 08/30/15 KLK. Organism ID to follow    Culture GRAM POSITIVE RODS  Final   Report Status PENDING  Incomplete  Blood Culture ID Panel (Reflexed)     Status: None   Collection Time: 08/28/15  8:03 AM  Result Value Ref Range Status   Enterococcus species NOT DETECTED NOT DETECTED Final   Vancomycin resistance NOT DETECTED NOT DETECTED Final   Listeria monocytogenes NOT DETECTED NOT DETECTED Final   Staphylococcus species NOT DETECTED NOT DETECTED Final   Staphylococcus aureus NOT DETECTED NOT DETECTED Final   Methicillin resistance NOT DETECTED NOT DETECTED Final   Streptococcus species NOT DETECTED NOT DETECTED Final   Streptococcus agalactiae NOT DETECTED NOT DETECTED Final   Streptococcus pneumoniae NOT DETECTED NOT DETECTED Final   Streptococcus  pyogenes NOT DETECTED NOT DETECTED Final   Acinetobacter baumannii NOT DETECTED NOT DETECTED Final   Enterobacteriaceae species NOT DETECTED NOT DETECTED Final   Enterobacter cloacae complex NOT DETECTED NOT DETECTED Final   Escherichia coli NOT DETECTED NOT DETECTED Final   Klebsiella oxytoca NOT DETECTED NOT DETECTED Final   Klebsiella pneumoniae NOT DETECTED NOT DETECTED Final   Proteus species NOT DETECTED NOT DETECTED Final   Serratia marcescens NOT DETECTED NOT DETECTED Final   Carbapenem resistance NOT DETECTED NOT DETECTED Final   Haemophilus influenzae NOT DETECTED NOT DETECTED Final   Neisseria meningitidis NOT DETECTED NOT DETECTED Final   Pseudomonas aeruginosa NOT DETECTED NOT DETECTED Final   Candida albicans NOT DETECTED NOT DETECTED Final   Candida glabrata NOT DETECTED NOT DETECTED Final   Candida krusei NOT DETECTED NOT DETECTED Final   Candida parapsilosis NOT DETECTED NOT DETECTED Final   Candida tropicalis NOT DETECTED NOT DETECTED Final  Culture, blood (routine x 2)     Status: None (Preliminary result)   Collection Time: 08/28/15  8:04 AM  Result Value Ref Range Status   Specimen Description BLOOD LEFT AC  Final   Special Requests   Final    BOTTLES DRAWN AEROBIC AND ANAEROBIC AER 1ML ANA .5ML   Culture NO GROWTH 3 DAYS  Final   Report Status PENDING  Incomplete       Today   Subjective:   Edwin Loiselle today has no headache,no chest abdominal pain,no new weakness tingling or numbness, feels much better wants to go home today.   Objective:   Blood pressure 153/72, pulse 95, temperature 98.1 F (36.7 C), temperature source Oral, resp. rate 16, height 5\' 11"  (1.803 m), weight 138.619 kg (305 lb 9.6 oz), SpO2 100 %.   Intake/Output Summary (Last 24 hours) at 08/31/15 1321 Last data filed at 08/30/15 1456  Gross per 24 hour  Intake      0 ml  Output    400 ml  Net   -400 ml  Exam Awake Alert, Oriented x 3, No new F.N deficits, Normal  affect [place].AT,PERRAL Supple Neck,No JVD, No cervical lymphadenopathy appriciated.  Symmetrical Chest wall movement, Good air movement bilaterally, CTAB RRR,No Gallops,Rubs or new Murmurs, No Parasternal Heave +ve B.Sounds, Abd Soft, Non tender, No organomegaly appriciated, No rebound -guarding or rigidity. No Cyanosis, Clubbing or edema, No new Rash or bruise  Data Review   CBC w Diff: Lab Results  Component Value Date   WBC 6.0 08/29/2015   HGB 10.2* 08/29/2015   HCT 31.0* 08/29/2015   PLT 209 08/29/2015   LYMPHOPCT 29 08/28/2015   MONOPCT 7 08/28/2015   EOSPCT 7 08/28/2015   BASOPCT 1 08/28/2015    CMP: Lab Results  Component Value Date   NA 138 08/29/2015   K 4.1 08/29/2015   CL 108 08/29/2015   CO2 19* 08/29/2015   BUN 19 08/29/2015   CREATININE 0.76 08/29/2015   PROT 7.8 08/28/2015   ALBUMIN 4.2 08/28/2015   BILITOT 0.5 08/28/2015   ALKPHOS 152* 08/28/2015   AST 16 08/28/2015   ALT 12* 08/28/2015  .   Total Time in preparing paper work, data evaluation and todays exam - 15 minutes  Oralia Criger M.D on 08/30/2015 at 1:21 PM    Note: This dictation was prepared with Dragon dictation along with smaller phrase technology. Any transcriptional errors that result from this process are unintentional.

## 2015-09-02 LAB — CULTURE, BLOOD (ROUTINE X 2): CULTURE: NO GROWTH

## 2015-09-05 LAB — BLOOD GAS, VENOUS
ACID-BASE EXCESS: 1.6 mmol/L (ref 0.0–3.0)
Bicarbonate: 25.8 mEq/L (ref 21.0–28.0)
DELIVERY SYSTEMS: POSITIVE
FIO2: 0.28
PCO2 VEN: 38 mmHg — AB (ref 44.0–60.0)
PH VEN: 7.44 — AB (ref 7.320–7.430)
Patient temperature: 37
RATE: 8 resp/min

## 2015-10-04 ENCOUNTER — Ambulatory Visit: Payer: Medicare Other | Attending: Anesthesiology | Admitting: Anesthesiology

## 2015-10-04 ENCOUNTER — Encounter: Payer: Self-pay | Admitting: Anesthesiology

## 2015-10-04 ENCOUNTER — Encounter (INDEPENDENT_AMBULATORY_CARE_PROVIDER_SITE_OTHER): Payer: Self-pay

## 2015-10-04 VITALS — BP 149/84 | HR 80 | Temp 98.2°F | Resp 22 | Ht 71.0 in | Wt 299.0 lb

## 2015-10-04 DIAGNOSIS — E119 Type 2 diabetes mellitus without complications: Secondary | ICD-10-CM | POA: Diagnosis not present

## 2015-10-04 DIAGNOSIS — M47817 Spondylosis without myelopathy or radiculopathy, lumbosacral region: Secondary | ICD-10-CM

## 2015-10-04 DIAGNOSIS — Z87891 Personal history of nicotine dependence: Secondary | ICD-10-CM | POA: Diagnosis not present

## 2015-10-04 DIAGNOSIS — Z951 Presence of aortocoronary bypass graft: Secondary | ICD-10-CM | POA: Diagnosis not present

## 2015-10-04 DIAGNOSIS — M170 Bilateral primary osteoarthritis of knee: Secondary | ICD-10-CM

## 2015-10-04 DIAGNOSIS — J449 Chronic obstructive pulmonary disease, unspecified: Secondary | ICD-10-CM | POA: Diagnosis not present

## 2015-10-04 DIAGNOSIS — M5442 Lumbago with sciatica, left side: Secondary | ICD-10-CM | POA: Diagnosis not present

## 2015-10-04 DIAGNOSIS — G4700 Insomnia, unspecified: Secondary | ICD-10-CM | POA: Diagnosis not present

## 2015-10-04 DIAGNOSIS — M5431 Sciatica, right side: Secondary | ICD-10-CM

## 2015-10-04 DIAGNOSIS — K219 Gastro-esophageal reflux disease without esophagitis: Secondary | ICD-10-CM | POA: Insufficient documentation

## 2015-10-04 DIAGNOSIS — K589 Irritable bowel syndrome without diarrhea: Secondary | ICD-10-CM | POA: Diagnosis not present

## 2015-10-04 DIAGNOSIS — F419 Anxiety disorder, unspecified: Secondary | ICD-10-CM | POA: Diagnosis not present

## 2015-10-04 DIAGNOSIS — I2510 Atherosclerotic heart disease of native coronary artery without angina pectoris: Secondary | ICD-10-CM | POA: Insufficient documentation

## 2015-10-04 DIAGNOSIS — F329 Major depressive disorder, single episode, unspecified: Secondary | ICD-10-CM | POA: Diagnosis not present

## 2015-10-04 DIAGNOSIS — M069 Rheumatoid arthritis, unspecified: Secondary | ICD-10-CM | POA: Diagnosis not present

## 2015-10-04 DIAGNOSIS — M5441 Lumbago with sciatica, right side: Secondary | ICD-10-CM | POA: Diagnosis not present

## 2015-10-04 DIAGNOSIS — M51369 Other intervertebral disc degeneration, lumbar region without mention of lumbar back pain or lower extremity pain: Secondary | ICD-10-CM

## 2015-10-04 DIAGNOSIS — I110 Hypertensive heart disease with heart failure: Secondary | ICD-10-CM | POA: Insufficient documentation

## 2015-10-04 DIAGNOSIS — M47897 Other spondylosis, lumbosacral region: Secondary | ICD-10-CM | POA: Diagnosis not present

## 2015-10-04 DIAGNOSIS — K469 Unspecified abdominal hernia without obstruction or gangrene: Secondary | ICD-10-CM | POA: Diagnosis not present

## 2015-10-04 DIAGNOSIS — R569 Unspecified convulsions: Secondary | ICD-10-CM | POA: Insufficient documentation

## 2015-10-04 DIAGNOSIS — M25519 Pain in unspecified shoulder: Secondary | ICD-10-CM | POA: Diagnosis present

## 2015-10-04 DIAGNOSIS — M5136 Other intervertebral disc degeneration, lumbar region: Secondary | ICD-10-CM | POA: Diagnosis not present

## 2015-10-04 DIAGNOSIS — M549 Dorsalgia, unspecified: Secondary | ICD-10-CM | POA: Diagnosis present

## 2015-10-04 DIAGNOSIS — M5432 Sciatica, left side: Secondary | ICD-10-CM

## 2015-10-04 MED ORDER — TRAMADOL HCL 50 MG PO TABS: 50.0000 mg | ORAL_TABLET | Freq: Four times a day (QID) | ORAL | Status: DC | PRN
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
## 2015-10-12 LAB — TOXASSURE SELECT 13 (MW), URINE: PDF: 0

## 2015-10-16 ENCOUNTER — Other Ambulatory Visit: Payer: Self-pay | Admitting: Family Medicine

## 2015-10-16 DIAGNOSIS — R569 Unspecified convulsions: Secondary | ICD-10-CM

## 2015-10-16 DIAGNOSIS — R519 Headache, unspecified: Secondary | ICD-10-CM

## 2015-10-16 DIAGNOSIS — R51 Headache: Principal | ICD-10-CM

## 2015-11-07 ENCOUNTER — Encounter: Payer: Medicare Other | Admitting: Anesthesiology

## 2015-11-13 ENCOUNTER — Ambulatory Visit: Payer: Medicare Other

## 2015-11-27 ENCOUNTER — Other Ambulatory Visit: Payer: Self-pay | Admitting: Family Medicine

## 2015-11-27 DIAGNOSIS — Z1239 Encounter for other screening for malignant neoplasm of breast: Secondary | ICD-10-CM

## 2015-12-10 ENCOUNTER — Telehealth: Payer: Self-pay | Admitting: Anesthesiology

## 2015-12-10 NOTE — Telephone Encounter (Signed)
Has appt sched 12-11-15 at 2:00 with Dr. Andree Elk, she was told she could not come in until she had MRI report from [place], that office cannot find that MRI at all. Needs to know whether to come in and let Dr. Andree Elk order MRI or what to do ?

## 2015-12-11 ENCOUNTER — Ambulatory Visit: Payer: Medicare Other | Admitting: Anesthesiology

## 2015-12-11 NOTE — Telephone Encounter (Signed)
Left message for patient that she would need to come in for her appt and talk with Dr Andree Elk about ordering an MRI if the results can't be found from the first one through the other office.

## 2015-12-25 ENCOUNTER — Ambulatory Visit: Payer: Medicare Other | Admitting: Anesthesiology

## 2016-01-08 ENCOUNTER — Encounter: Payer: Self-pay | Admitting: Anesthesiology

## 2016-01-08 ENCOUNTER — Ambulatory Visit: Payer: Medicare Other | Attending: Anesthesiology | Admitting: Anesthesiology

## 2016-01-08 VITALS — BP 158/94 | HR 69 | Temp 98.2°F | Resp 16 | Ht 71.0 in | Wt 290.0 lb

## 2016-01-08 DIAGNOSIS — Z794 Long term (current) use of insulin: Secondary | ICD-10-CM | POA: Insufficient documentation

## 2016-01-08 DIAGNOSIS — J449 Chronic obstructive pulmonary disease, unspecified: Secondary | ICD-10-CM | POA: Diagnosis not present

## 2016-01-08 DIAGNOSIS — Z9889 Other specified postprocedural states: Secondary | ICD-10-CM | POA: Diagnosis not present

## 2016-01-08 DIAGNOSIS — M5136 Other intervertebral disc degeneration, lumbar region: Secondary | ICD-10-CM

## 2016-01-08 DIAGNOSIS — M47817 Spondylosis without myelopathy or radiculopathy, lumbosacral region: Secondary | ICD-10-CM | POA: Diagnosis not present

## 2016-01-08 DIAGNOSIS — E119 Type 2 diabetes mellitus without complications: Secondary | ICD-10-CM | POA: Diagnosis not present

## 2016-01-08 DIAGNOSIS — Z79899 Other long term (current) drug therapy: Secondary | ICD-10-CM | POA: Insufficient documentation

## 2016-01-08 DIAGNOSIS — M5137 Other intervertebral disc degeneration, lumbosacral region: Secondary | ICD-10-CM | POA: Diagnosis present

## 2016-01-08 DIAGNOSIS — I10 Essential (primary) hypertension: Secondary | ICD-10-CM | POA: Diagnosis not present

## 2016-01-08 DIAGNOSIS — M5432 Sciatica, left side: Secondary | ICD-10-CM | POA: Diagnosis not present

## 2016-01-08 DIAGNOSIS — G8929 Other chronic pain: Secondary | ICD-10-CM | POA: Diagnosis not present

## 2016-01-08 DIAGNOSIS — M5431 Sciatica, right side: Secondary | ICD-10-CM

## 2016-01-08 DIAGNOSIS — M170 Bilateral primary osteoarthritis of knee: Secondary | ICD-10-CM | POA: Diagnosis not present

## 2016-01-08 MED ORDER — TRAMADOL HCL 50 MG PO TABS: 100.0000 mg | ORAL_TABLET | Freq: Four times a day (QID) | ORAL | 1 refills | Status: DC | PRN
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
## 2016-01-08 NOTE — Progress Notes (Signed)
Safety precautions to be maintained throughout the outpatient stay will include: orient to surroundings, keep bed in low position, maintain call bell within reach at all times, provide assistance with transfer out of bed and ambulation.  

## 2016-02-14 ENCOUNTER — Other Ambulatory Visit: Payer: Self-pay | Admitting: Family Medicine

## 2016-02-14 DIAGNOSIS — M25562 Pain in left knee: Secondary | ICD-10-CM

## 2016-02-29 ENCOUNTER — Ambulatory Visit: Payer: Medicare Other

## 2016-03-11 ENCOUNTER — Ambulatory Visit
Admission: RE | Admit: 2016-03-11 | Discharge: 2016-03-11 | Disposition: A | Discharge status: Home / Self Care | Payer: Medicare Other | Source: Ambulatory Visit | Attending: Family Medicine | Admitting: Family Medicine

## 2016-03-11 DIAGNOSIS — M1712 Unilateral primary osteoarthritis, left knee: Secondary | ICD-10-CM | POA: Insufficient documentation

## 2016-03-11 DIAGNOSIS — S83282A Other tear of lateral meniscus, current injury, left knee, initial encounter: Secondary | ICD-10-CM | POA: Insufficient documentation

## 2016-03-11 DIAGNOSIS — S83512S Sprain of anterior cruciate ligament of left knee, sequela: Secondary | ICD-10-CM | POA: Insufficient documentation

## 2016-03-11 DIAGNOSIS — M25562 Pain in left knee: Secondary | ICD-10-CM | POA: Diagnosis not present

## 2016-03-11 DIAGNOSIS — X58XXXA Exposure to other specified factors, initial encounter: Secondary | ICD-10-CM | POA: Insufficient documentation

## 2016-03-11 DIAGNOSIS — S83522S Sprain of posterior cruciate ligament of left knee, sequela: Secondary | ICD-10-CM | POA: Diagnosis not present

## 2016-03-11 DIAGNOSIS — M7122 Synovial cyst of popliteal space [Baker], left knee: Secondary | ICD-10-CM | POA: Insufficient documentation

## 2016-03-11 DIAGNOSIS — M25462 Effusion, left knee: Secondary | ICD-10-CM | POA: Diagnosis not present

## 2016-04-29 ENCOUNTER — Ambulatory Visit: Payer: Medicare Other | Admitting: Anesthesiology

## 2016-06-10 ENCOUNTER — Ambulatory Visit: Payer: Medicare Other | Admitting: Cardiology

## 2016-06-16 ENCOUNTER — Observation Stay
Admission: EM | Admit: 2016-06-16 | Discharge: 2016-06-18 | Disposition: A | Discharge status: Home / Self Care | Payer: Medicare HMO | Attending: Internal Medicine | Admitting: Internal Medicine

## 2016-06-16 ENCOUNTER — Encounter: Payer: Self-pay | Admitting: Emergency Medicine

## 2016-06-16 ENCOUNTER — Emergency Department: Payer: Medicare HMO

## 2016-06-16 DIAGNOSIS — Z955 Presence of coronary angioplasty implant and graft: Secondary | ICD-10-CM | POA: Insufficient documentation

## 2016-06-16 DIAGNOSIS — I252 Old myocardial infarction: Secondary | ICD-10-CM | POA: Diagnosis not present

## 2016-06-16 DIAGNOSIS — I472 Ventricular tachycardia: Secondary | ICD-10-CM | POA: Diagnosis not present

## 2016-06-16 DIAGNOSIS — I110 Hypertensive heart disease with heart failure: Secondary | ICD-10-CM | POA: Insufficient documentation

## 2016-06-16 DIAGNOSIS — I2510 Atherosclerotic heart disease of native coronary artery without angina pectoris: Secondary | ICD-10-CM | POA: Insufficient documentation

## 2016-06-16 DIAGNOSIS — R079 Chest pain, unspecified: Secondary | ICD-10-CM

## 2016-06-16 DIAGNOSIS — R072 Precordial pain: Principal | ICD-10-CM | POA: Insufficient documentation

## 2016-06-16 DIAGNOSIS — I5032 Chronic diastolic (congestive) heart failure: Secondary | ICD-10-CM | POA: Diagnosis not present

## 2016-06-16 DIAGNOSIS — Z87891 Personal history of nicotine dependence: Secondary | ICD-10-CM | POA: Insufficient documentation

## 2016-06-16 DIAGNOSIS — Z794 Long term (current) use of insulin: Secondary | ICD-10-CM | POA: Insufficient documentation

## 2016-06-16 DIAGNOSIS — I272 Pulmonary hypertension, unspecified: Secondary | ICD-10-CM | POA: Diagnosis not present

## 2016-06-16 DIAGNOSIS — Z951 Presence of aortocoronary bypass graft: Secondary | ICD-10-CM | POA: Diagnosis not present

## 2016-06-16 DIAGNOSIS — Z79899 Other long term (current) drug therapy: Secondary | ICD-10-CM | POA: Diagnosis not present

## 2016-06-16 DIAGNOSIS — I209 Angina pectoris, unspecified: Secondary | ICD-10-CM | POA: Diagnosis present

## 2016-06-16 DIAGNOSIS — G40909 Epilepsy, unspecified, not intractable, without status epilepticus: Secondary | ICD-10-CM | POA: Insufficient documentation

## 2016-06-16 DIAGNOSIS — Z886 Allergy status to analgesic agent status: Secondary | ICD-10-CM | POA: Diagnosis not present

## 2016-06-16 DIAGNOSIS — Z791 Long term (current) use of non-steroidal anti-inflammatories (NSAID): Secondary | ICD-10-CM | POA: Diagnosis not present

## 2016-06-16 DIAGNOSIS — E119 Type 2 diabetes mellitus without complications: Secondary | ICD-10-CM | POA: Insufficient documentation

## 2016-06-16 DIAGNOSIS — J449 Chronic obstructive pulmonary disease, unspecified: Secondary | ICD-10-CM | POA: Diagnosis not present

## 2016-06-16 DIAGNOSIS — F329 Major depressive disorder, single episode, unspecified: Secondary | ICD-10-CM | POA: Insufficient documentation

## 2016-06-16 DIAGNOSIS — I509 Heart failure, unspecified: Secondary | ICD-10-CM

## 2016-06-16 DIAGNOSIS — I25118 Atherosclerotic heart disease of native coronary artery with other forms of angina pectoris: Secondary | ICD-10-CM

## 2016-06-16 DIAGNOSIS — I5030 Unspecified diastolic (congestive) heart failure: Secondary | ICD-10-CM

## 2016-06-16 DIAGNOSIS — I10 Essential (primary) hypertension: Secondary | ICD-10-CM

## 2016-06-16 DIAGNOSIS — Z6841 Body Mass Index (BMI) 40.0 and over, adult: Secondary | ICD-10-CM | POA: Diagnosis not present

## 2016-06-16 HISTORY — DX: Chest pain, unspecified: R07.9

## 2016-06-16 LAB — BASIC METABOLIC PANEL
Anion gap: 7 (ref 5–15)
BUN: 14 mg/dL (ref 6–20)
CALCIUM: 8.6 mg/dL — AB (ref 8.9–10.3)
CHLORIDE: 108 mmol/L (ref 101–111)
CO2: 26 mmol/L (ref 22–32)
CREATININE: 0.74 mg/dL (ref 0.44–1.00)
GFR calc Af Amer: >60 mL/min (ref >60)
GFR calc non Af Amer: >60 mL/min (ref >60)
Glucose, Bld: 142 mg/dL — ABNORMAL HIGH (ref 65–99)
Potassium: 3.8 mmol/L (ref 3.5–5.1)
SODIUM: 141 mmol/L (ref 135–145)

## 2016-06-16 LAB — GLUCOSE, CAPILLARY: GLUCOSE-CAPILLARY: 132 mg/dL — AB (ref 65–99)

## 2016-06-16 LAB — MAGNESIUM: Magnesium: 1.8 mg/dL (ref 1.7–2.4)

## 2016-06-16 LAB — TROPONIN I: Troponin I: <0.03 ng/mL (ref <0.03)

## 2016-06-16 LAB — CBC
HCT: 32.2 % — ABNORMAL LOW (ref 35.0–47.0)
Hemoglobin: 10.8 g/dL — ABNORMAL LOW (ref 12.0–16.0)
MCH: 27.8 pg (ref 26.0–34.0)
MCHC: 33.4 g/dL (ref 32.0–36.0)
MCV: 83.1 fL (ref 80.0–100.0)
PLATELETS: 206 10*3/uL (ref 150–440)
RBC: 3.87 MIL/uL (ref 3.80–5.20)
RDW: 15 % — AB (ref 11.5–14.5)
WBC: 5.4 10*3/uL (ref 3.6–11.0)

## 2016-06-16 MED ORDER — TIOTROPIUM BROMIDE MONOHYDRATE 18 MCG IN CAPS
18.0000 ug | ORAL_CAPSULE | Freq: Every day | RESPIRATORY_TRACT | Status: DC
  Administered 2016-06-17 – 2016-06-18 (×2): 18 ug via RESPIRATORY_TRACT
  Filled 2016-06-16: qty 5

## 2016-06-16 MED ORDER — LEVETIRACETAM 750 MG PO TABS
750.0000 mg | ORAL_TABLET | Freq: Two times a day (BID) | ORAL | Status: DC
  Administered 2016-06-16 – 2016-06-18 (×4): 750 mg via ORAL
  Filled 2016-06-16 (×5): qty 1

## 2016-06-16 MED ORDER — ONDANSETRON HCL 4 MG/2ML IJ SOLN: 4.0000 mg | Freq: Four times a day (QID) | INTRAMUSCULAR | Status: DC | PRN

## 2016-06-16 MED ORDER — NITROGLYCERIN 2 % TD OINT
0.5000 [in_us] | TOPICAL_OINTMENT | Freq: Four times a day (QID) | TRANSDERMAL | Status: DC
  Administered 2016-06-16 – 2016-06-18 (×5): 0.5 [in_us] via TOPICAL
  Filled 2016-06-16 (×4): qty 1

## 2016-06-16 MED ORDER — ALBUTEROL SULFATE (2.5 MG/3ML) 0.083% IN NEBU: 3.0000 mL | INHALATION_SOLUTION | Freq: Four times a day (QID) | RESPIRATORY_TRACT | Status: DC | PRN

## 2016-06-16 MED ORDER — INSULIN GLARGINE 100 UNIT/ML ~~LOC~~ SOLN
20.0000 [IU] | Freq: Every day | SUBCUTANEOUS | Status: DC
  Administered 2016-06-16 – 2016-06-17 (×2): 20 [IU] via SUBCUTANEOUS
  Filled 2016-06-16 (×3): qty 0.2

## 2016-06-16 MED ORDER — DOCUSATE SODIUM 100 MG PO CAPS
100.0000 mg | ORAL_CAPSULE | Freq: Two times a day (BID) | ORAL | Status: DC
  Administered 2016-06-17 – 2016-06-18 (×3): 100 mg via ORAL
  Filled 2016-06-16 (×3): qty 1

## 2016-06-16 MED ORDER — CARVEDILOL 3.125 MG PO TABS
3.1250 mg | ORAL_TABLET | Freq: Two times a day (BID) | ORAL | Status: DC
  Administered 2016-06-17 – 2016-06-18 (×3): 3.125 mg via ORAL
  Filled 2016-06-16 (×3): qty 1

## 2016-06-16 MED ORDER — CARVEDILOL 6.25 MG PO TABS: 6.2500 mg | ORAL_TABLET | Freq: Two times a day (BID) | ORAL | Status: DC

## 2016-06-16 MED ORDER — PANTOPRAZOLE SODIUM 40 MG PO TBEC
40.0000 mg | DELAYED_RELEASE_TABLET | Freq: Every day | ORAL | Status: DC
  Administered 2016-06-16 – 2016-06-18 (×3): 40 mg via ORAL
  Filled 2016-06-16 (×2): qty 1

## 2016-06-16 MED ORDER — CLOPIDOGREL BISULFATE 75 MG PO TABS
ORAL_TABLET | ORAL | Status: AC
  Filled 2016-06-16: qty 1

## 2016-06-16 MED ORDER — MELOXICAM 7.5 MG PO TABS
7.5000 mg | ORAL_TABLET | Freq: Every day | ORAL | Status: DC
  Administered 2016-06-17 – 2016-06-18 (×2): 7.5 mg via ORAL
  Filled 2016-06-16 (×2): qty 1

## 2016-06-16 MED ORDER — BISACODYL 5 MG PO TBEC: 5.0000 mg | DELAYED_RELEASE_TABLET | Freq: Every day | ORAL | Status: DC | PRN

## 2016-06-16 MED ORDER — NITROGLYCERIN 2 % TD OINT
TOPICAL_OINTMENT | TRANSDERMAL | Status: AC
  Filled 2016-06-16: qty 1

## 2016-06-16 MED ORDER — ONDANSETRON HCL 4 MG PO TABS: 4.0000 mg | ORAL_TABLET | Freq: Four times a day (QID) | ORAL | Status: DC | PRN

## 2016-06-16 MED ORDER — ACETAMINOPHEN 325 MG PO TABS
ORAL_TABLET | ORAL | Status: AC
  Administered 2016-06-16: 650 mg via ORAL
  Filled 2016-06-16: qty 2

## 2016-06-16 MED ORDER — ACETAMINOPHEN 325 MG PO TABS
650.0000 mg | ORAL_TABLET | Freq: Four times a day (QID) | ORAL | Status: DC | PRN
  Administered 2016-06-16 – 2016-06-18 (×3): 650 mg via ORAL
  Filled 2016-06-16 (×2): qty 2

## 2016-06-16 MED ORDER — TRAZODONE HCL 50 MG PO TABS: 25.0000 mg | ORAL_TABLET | Freq: Every evening | ORAL | Status: DC | PRN

## 2016-06-16 MED ORDER — TRAZODONE HCL 50 MG PO TABS
50.0000 mg | ORAL_TABLET | Freq: Every day | ORAL | Status: DC
  Administered 2016-06-16 – 2016-06-17 (×2): 50 mg via ORAL
  Filled 2016-06-16 (×2): qty 1

## 2016-06-16 MED ORDER — LOSARTAN POTASSIUM 50 MG PO TABS
100.0000 mg | ORAL_TABLET | Freq: Every day | ORAL | Status: DC
  Administered 2016-06-16 – 2016-06-18 (×3): 100 mg via ORAL
  Filled 2016-06-16 (×2): qty 2

## 2016-06-16 MED ORDER — ROSUVASTATIN CALCIUM 10 MG PO TABS
10.0000 mg | ORAL_TABLET | Freq: Every day | ORAL | Status: DC
  Administered 2016-06-17 – 2016-06-18 (×2): 10 mg via ORAL
  Filled 2016-06-16 (×2): qty 1

## 2016-06-16 MED ORDER — MAGNESIUM SULFATE 50 % IJ SOLN
1.0000 g | Freq: Once | INTRAMUSCULAR | Status: AC
  Administered 2016-06-16: 1 g via INTRAVENOUS
  Filled 2016-06-16: qty 2

## 2016-06-16 MED ORDER — HYDRALAZINE HCL 25 MG PO TABS
25.0000 mg | ORAL_TABLET | Freq: Two times a day (BID) | ORAL | Status: DC
  Administered 2016-06-16 – 2016-06-17 (×2): 25 mg via ORAL
  Filled 2016-06-16 (×2): qty 1

## 2016-06-16 MED ORDER — PANTOPRAZOLE SODIUM 40 MG PO TBEC
DELAYED_RELEASE_TABLET | ORAL | Status: AC
  Filled 2016-06-16: qty 1

## 2016-06-16 MED ORDER — ACETAMINOPHEN 650 MG RE SUPP: 650.0000 mg | Freq: Four times a day (QID) | RECTAL | Status: DC | PRN

## 2016-06-16 MED ORDER — ENOXAPARIN SODIUM 40 MG/0.4ML ~~LOC~~ SOLN
40.0000 mg | Freq: Two times a day (BID) | SUBCUTANEOUS | Status: DC
  Administered 2016-06-17 – 2016-06-18 (×3): 40 mg via SUBCUTANEOUS
  Filled 2016-06-16 (×3): qty 0.4

## 2016-06-16 MED ORDER — CLOPIDOGREL BISULFATE 75 MG PO TABS
75.0000 mg | ORAL_TABLET | Freq: Every day | ORAL | Status: DC
  Administered 2016-06-17 – 2016-06-18 (×2): 75 mg via ORAL
  Filled 2016-06-16 (×2): qty 1

## 2016-06-16 MED ORDER — CITALOPRAM HYDROBROMIDE 20 MG PO TABS
20.0000 mg | ORAL_TABLET | Freq: Every day | ORAL | Status: DC
Start: 2016-06-16 — End: 2016-06-18
  Administered 2016-06-17 – 2016-06-18 (×2): 20 mg via ORAL
  Filled 2016-06-16 (×2): qty 1

## 2016-06-16 MED ORDER — LOSARTAN POTASSIUM 50 MG PO TABS
ORAL_TABLET | ORAL | Status: AC
  Filled 2016-06-16: qty 2

## 2016-06-16 NOTE — Progress Notes (Signed)
Lovenox changed to 40 mg BID for BMI >40 and CrCl >30. 

## 2016-06-16 NOTE — ED Provider Notes (Signed)
[facility] Emergency Department Provider Note  Time seen: 5:02 PM  I have reviewed the triage vital signs and the nursing notes.   HISTORY  Chief Complaint Chest Pain    HPI Tina Hayes is a 71 y.o. female with a past medical history of anxiety, CHF, COPD, diabetes, hypertension, CABG, presents the emergency department with chest discomfort. According to the patient for the past 3 days she has been experiencing a cough and some chest discomfort which she describes as a tightness or heaviness in the center of her chest. Patient states she saw a primary care Dr. Dorma Russell one week ago and was prescribed an antibiotic for a sinus infection, states she is almost out of antibiotics but continues to have a mild cough. Patient has a history of stents in the past as well as a CABG performed 1-2 years ago in [place]. Denies any stents since that time. Currently the patient appears well, no distress. States an aspirin allergy.  Past Medical History:  Diagnosis Date  . Anxiety   . CHF (congestive heart failure) ([place])   . Chronic back pain   . COPD (chronic obstructive pulmonary disease) ([employer])   . Coronary artery disease   . Depression   . Diabetes mellitus without complication ([place])   . Hypertension   . Seizures [facility])     Patient Active Problem List   Diagnosis Date Noted  . COPD exacerbation ([place]) 08/28/2015  . Hypertensive urgency 06/03/2015  . COPD with acute exacerbation ([place]) 04/14/2015    Past Surgical History:  Procedure Laterality Date  . ANKLE SURGERY    . CORONARY ARTERY BYPASS GRAFT    . KNEE SURGERY      Prior to Admission medications   Medication Sig Start Date End Date Taking? Authorizing Provider  albuterol (PROVENTIL HFA;VENTOLIN HFA) 108 (90 Base) MCG/ACT inhaler Inhale 2 puffs into the lungs every 6 (six) hours as needed for wheezing or shortness of breath. 08/30/15   Epifanio Lesches, MD  amLODipine (NORVASC) 10 MG tablet Take 10 mg by  mouth daily.    Historical Provider, MD  atorvastatin (LIPITOR) 40 MG tablet Take 40 mg by mouth at bedtime.    Historical Provider, MD  cetirizine (ZYRTEC) 10 MG tablet Take 10 mg by mouth daily.    Historical Provider, MD  hydrALAZINE (APRESOLINE) 25 MG tablet Take 1 tablet (25 mg total) by mouth 2 (two) times daily. 06/05/15   Ripudeep Krystal Eaton, MD  insulin aspart (NOVOLOG) 100 UNIT/ML injection Inject 0-10 Units into the skin 3 (three) times daily with meals. Sliding scale CBG 70 - 120: 0 units CBG 121 - 150: 1 unit,  CBG 151 - 200: 2 units,  CBG 201 - 250: 3 units,  CBG 251 - 300: 5 units,  CBG 301 - 350: 7 units,  CBG 351 - 400: 9 units   CBG > 400: 10 units and notify your MD 06/05/15   Ripudeep Krystal Eaton, MD  insulin glargine (LANTUS) 100 UNIT/ML injection Inject 0.2 mLs (20 Units total) into the skin at bedtime. 08/30/15   Epifanio Lesches, MD  levETIRAcetam (KEPPRA) 750 MG tablet Take 1 tablet (750 mg total) by mouth 2 (two) times daily. 06/05/15   Ripudeep Krystal Eaton, MD  losartan (COZAAR) 100 MG tablet Take 1 tablet (100 mg total) by mouth daily. 06/05/15   Ripudeep Krystal Eaton, MD  meloxicam (MOBIC) 7.5 MG tablet Take 1 tablet (7.5 mg total) by mouth daily. 06/05/15   Ripudeep K  Rai, MD  omeprazole (PRILOSEC) 40 MG capsule Take 1 capsule (40 mg total) by mouth 2 (two) times daily. 06/05/15   Ripudeep Krystal Eaton, MD  sertraline (ZOLOFT) 50 MG tablet Take 50 mg by mouth daily.    Historical Provider, MD  tiotropium (SPIRIVA) 18 MCG inhalation capsule Place 18 mcg into inhaler and inhale daily.    Historical Provider, MD  traMADol (ULTRAM) 50 MG tablet Take 2 tablets (100 mg total) by mouth every 6 (six) hours as needed for moderate pain. 01/08/16   Molli Barrows, MD  traZODone (DESYREL) 50 MG tablet Take 50 mg by mouth at bedtime.    Historical Provider, MD    Allergies  Allergen Reactions  . Aspirin Anaphylaxis    Family History  Problem Relation Age of Onset  . Diabetes    . Hypertension      Social  History Social History  Substance Use Topics  . Smoking status: Former Research scientist (life sciences)  . Smokeless tobacco: Never Used  . Alcohol use No    Review of Systems Constitutional: Negative for fever. Cardiovascular: Positive for chest discomfort/tightness Respiratory: Negative for shortness of breath. Occasional cough Gastrointestinal: Negative for abdominal pain Neurological: Negative for headache 10-point ROS otherwise negative.  ____________________________________________   PHYSICAL EXAM:  Constitutional: Alert and oriented. Well appearing and in no distress. Eyes: Normal exam ENT   Head: Normocephalic and atraumatic.   Mouth/Throat: Mucous membranes are moist. Cardiovascular: Normal rate, regular rhythm. No murmur Respiratory: Normal respiratory effort without tachypnea nor retractions. Breath sounds are clear  Gastrointestinal: Soft and nontender. No distention.  Musculoskeletal: Nontender with normal range of motion in all extremities Neurologic:  Normal speech and language. No gross focal neurologic deficits  Skin:  Skin is warm, dry and intact.  Psychiatric: Mood and affect are normal.  ____________________________________________    EKG  EKG reviewed and interpreted by myself shows normal sinus rhythm at 54 bpm, narrow QRS, normal axis, largely normal intervals with nonspecific ST changes.  ____________________________________________    RADIOLOGY  Chest x-ray shows mild cardiomegaly.  ____________________________________________   INITIAL IMPRESSION / ASSESSMENT AND PLAN / ED COURSE  Pertinent labs & imaging results that were available during my care of the patient were reviewed by me and considered in my medical decision making (see chart for details).  The patient presents the emergency department with chest discomfort and continued cough for the past 3 days. Patient states a history of recent sinus infection with cough prescribed an antibiotic which is  almost gone but she continues to have a mild cough. She states over the past 3 days she has been experiencing chest tightness and discomfort in the center of her chest. States she was concerned given her history of cardiac disease in the past. She had a CABG perform one or 2 years ago in [place], but states she has not had any stents placed since then. We will check labs, chest x-ray, EKG and closely monitor while awaiting results. Overall the patient appears well, no distress.  Patient's labs are largely within normal limits. Patient is feeling better however while awaiting the second troponin the patient had a 4 beat run of ventricular tachycardia which she felt lightheaded. Given the patient's ventricular tachycardia with significant cardiac history we will admit to the hospital for further monitoring and treatment.  ____________________________________________   FINAL CLINICAL IMPRESSION(S) / ED DIAGNOSES  Chest pain  cough    Harvest Dark, MD 06/16/16 2021

## 2016-06-16 NOTE — ED Notes (Signed)
Pt given food tray and water at this time 

## 2016-06-16 NOTE — ED Triage Notes (Signed)
Pt from home via ems with chest pain, coughing, recent sinus infection, for which she is finishing up antibiotics. Pt states she had some vomiting yesterday. She has hx of 3 stents, as well as cabg. NAD noted.

## 2016-06-16 NOTE — ED Notes (Signed)
Pt had 4 beat run of Vtach.  EDP notified.  Pt reported feeling light headed approx at same time as vtach occurred.  EDP notified of this as well.  Acknowledged.  No new orders at this time.

## 2016-06-16 NOTE — H&P (Signed)
[facility] [place] NAME: Tina Hayes    MR#:  [id number]  DATE OF BIRTH:  08/07/1945  DATE OF ADMISSION:  06/16/2016  PRIMARY CARE PHYSICIAN: Sabino Snipes KEY, MD   REQUESTING/REFERRING PHYSICIAN: Dr.Kevin Paduchowski  CHIEF COMPLAINT:   Chief Complaint  Patient presents with  . Chest Pain    HISTORY OF PRESENT ILLNESS:  Tina Hayes  is a 71 y.o. female with a known history of htn,, Diabetes mellitus type 2, coronary artery disease status post CABG, history of 5 stents before, comes with chest pain in the midsternal region going on for 3 days. Patient feels like heaviness in the chest going down the left arm associated with shortness of breath, nausea. Chest pain comes and goes. No cough, no fever. Patient to home living with her son for the past one year but has no primary cardiologist yet. Patient says that 5 stents 3 years ago, CABG one and half years ago. She says she is allergic to aspirin. Initial troponins negative. Patient had 4 beat run of V. tach in the emergency room at that time she felt lightheaded.  PAST MEDICAL HISTORY:   Past Medical History:  Diagnosis Date  . Anxiety   . CHF (congestive heart failure) ([place])   . Chronic back pain   . COPD (chronic obstructive pulmonary disease) ([place])   . Coronary artery disease   . Depression   . Diabetes mellitus without complication ([place])   . Hypertension   . Seizures ([place])     PAST SURGICAL HISTOIRY:   Past Surgical History:  Procedure Laterality Date  . ANKLE SURGERY    . CORONARY ARTERY BYPASS GRAFT    . KNEE SURGERY      SOCIAL HISTORY:   Social History  Substance Use Topics  . Smoking status: Former Research scientist (life sciences)  . Smokeless tobacco: Never Used  . Alcohol use No    FAMILY HISTORY:   Family History  Problem Relation Age of Onset  . Diabetes    . Hypertension      DRUG ALLERGIES:   Allergies  Allergen Reactions  . Aspirin Anaphylaxis    REVIEW  OF SYSTEMS:  CONSTITUTIONAL: No fever, fatigue or weakness.  EYES: No blurred or double vision.  EARS, NOSE, AND THROAT: No tinnitus or ear pain.  RESPIRATORY: No cough, shortness of breath, wheezing or hemoptysis.  CARDIOVASCULARChest pain, shortness of breath for 3 days.  GASTROINTESTINAL: No nausea, vomiting, diarrhea or abdominal pain.  GENITOURINARY: No dysuria, hematuria.  ENDOCRINE: No polyuria, nocturia,  HEMATOLOGY: No anemia, easy bruising or bleeding SKIN: No rash or lesion. MUSCULOSKELETAL: No joint pain or arthritis.   NEUROLOGIC: No tingling, numbness, weakness.  PSYCHIATRY: No anxiety or depression.   MEDICATIONS AT HOME:   Prior to Admission medications   Medication Sig Start Date End Date Taking? Authorizing Provider  atorvastatin (LIPITOR) 40 MG tablet Take 40 mg by mouth at bedtime.   Yes Historical Provider, MD  carvedilol (COREG) 6.25 MG tablet Take 6.25 mg by mouth 2 (two) times daily with a meal.   Yes Historical Provider, MD  cetirizine (ZYRTEC) 10 MG tablet Take 10 mg by mouth daily.   Yes Historical Provider, MD  citalopram (CELEXA) 20 MG tablet Take 20 mg by mouth daily.   Yes Historical Provider, MD  hydrALAZINE (APRESOLINE) 25 MG tablet Take 1 tablet (25 mg total) by mouth 2 (two) times daily. 06/05/15  Yes Ripudeep Krystal Eaton, MD  insulin glargine (  LANTUS) 100 UNIT/ML injection Inject 0.2 mLs (20 Units total) into the skin at bedtime. Patient taking differently: Inject 20-30 Units into the skin 2 (two) times daily. Take 20 units in morning and 30 units at night 08/30/15  Yes Epifanio Lesches, MD  levETIRAcetam (KEPPRA) 750 MG tablet Take 1 tablet (750 mg total) by mouth 2 (two) times daily. 06/05/15  Yes Ripudeep Krystal Eaton, MD  losartan (COZAAR) 100 MG tablet Take 1 tablet (100 mg total) by mouth daily. 06/05/15  Yes Ripudeep Krystal Eaton, MD  meloxicam (MOBIC) 7.5 MG tablet Take 1 tablet (7.5 mg total) by mouth daily. 06/05/15  Yes Ripudeep Krystal Eaton, MD  omeprazole (PRILOSEC) 40 MG  capsule Take 1 capsule (40 mg total) by mouth 2 (two) times daily. 06/05/15  Yes Ripudeep Krystal Eaton, MD  tiotropium (SPIRIVA) 18 MCG inhalation capsule Place 18 mcg into inhaler and inhale daily.   Yes Historical Provider, MD  traZODone (DESYREL) 50 MG tablet Take 50 mg by mouth at bedtime.   Yes Historical Provider, MD  albuterol (PROVENTIL HFA;VENTOLIN HFA) 108 (90 Base) MCG/ACT inhaler Inhale 2 puffs into the lungs every 6 (six) hours as needed for wheezing or shortness of breath. 08/30/15   Epifanio Lesches, MD  insulin aspart (NOVOLOG) 100 UNIT/ML injection Inject 0-10 Units into the skin 3 (three) times daily with meals. Sliding scale CBG 70 - 120: 0 units CBG 121 - 150: 1 unit,  CBG 151 - 200: 2 units,  CBG 201 - 250: 3 units,  CBG 251 - 300: 5 units,  CBG 301 - 350: 7 units,  CBG 351 - 400: 9 units   CBG > 400: 10 units and notify your MD Patient not taking: Reported on 06/16/2016 06/05/15   Ripudeep Krystal Eaton, MD  traMADol (ULTRAM) 50 MG tablet Take 2 tablets (100 mg total) by mouth every 6 (six) hours as needed for moderate pain. 01/08/16   Molli Barrows, MD      VITAL SIGNS:  Blood pressure (!) 166/94, pulse (!) 54, temperature 98.1 F (36.7 C), temperature source Oral, resp. rate 11, height 5\' 11"  (1.803 m), weight (!) 137.9 kg (304 lb), SpO2 98 %.  PHYSICAL EXAMINATION:  GENERAL:  72 y.o.-year-old patient lying in the bed with no acute distress.  EYES: Pupils equal, round, reactive to light and accommodation. No scleral icterus. Extraocular muscles intact.  HEENT: Head atraumatic, normocephalic. Oropharynx and nasopharynx clear.  NECK:  Supple, no jugular venous distention. No thyroid enlargement, no tenderness.  LUNGS: Normal breath sounds bilaterally, no wheezing, rales,rhonchi or crepitation. No use of accessory muscles of respiration.  CARDIOVASCULAR: S1, S2 normal. No murmurs, rubs, or gallops.  ABDOMEN: Soft, nontender, nondistended. Bowel sounds present. No organomegaly or mass.   EXTREMITIES: No pedal edema, cyanosis, or clubbing.  NEUROLOGIC: Cranial nerves II through XII are intact. Muscle strength 5/5 in all extremities. Sensation intact. Gait not checked.  PSYCHIATRIC: The patient is alert and oriented x 3.  SKIN: No obvious rash, lesion, or ulcer.   LABORATORY PANEL:   CBC  Recent Labs Lab 06/16/16 1712  WBC 5.4  HGB 10.8*  HCT 32.2*  PLT 206   ------------------------------------------------------------------------------------------------------------------  Chemistries   Recent Labs Lab 06/16/16 1712  NA 141  K 3.8  CL 108  CO2 26  GLUCOSE 142*  BUN 14  CREATININE 0.74  CALCIUM 8.6*   ------------------------------------------------------------------------------------------------------------------  Cardiac Enzymes  Recent Labs Lab 06/16/16 1941  TROPONINI <0.03   ------------------------------------------------------------------------------------------------------------------  RADIOLOGY:  Dg  Chest 2 View  Result Date: 06/16/2016 CLINICAL DATA:  Chest pain and cough EXAM: CHEST  2 VIEW COMPARISON:  08/28/2015 FINDINGS: Post sternotomy changes. No acute consolidation or pleural effusion. Linear atelectasis in the left lower lung. Mild cardiomegaly without overt failure. No pneumothorax. Suspect moderate hiatal hernia. Degenerative changes of the spine. IMPRESSION: 1. Mild cardiomegaly without overt failure. 2. Linear atelectasis in the left lower lung.  No acute infiltrate 3. Suspect moderate hiatal hernia. Electronically Signed   By: Donavan Foil M.D.   On: 06/16/2016 18:07    EKG:   Orders placed or performed during the hospital encounter of 06/16/16  . ED EKG within 10 minutes  . ED EKG within 10 minutes  EKG shows normal sinus rhythm 54 bpm, no ST -T changes.  IMPRESSION AND PLAN:  71 year old female patient with the essential hypertension, diabetes mellitus type 2, COPD, hyperlipidemia, coronary disease with CABG comes in  with chest pain,, shortness of breath for 3 days. #1 .chest pain likely angina secondary to her history of coronary disease with CABG, other risk factors of hypertension, diabetes mellitus. Cycle the troponins 2 more times, continue monitoring on telemetry, patient is allergic to aspirin, continue Plavix, nitrates, high intensity statins, adjust the dose of Coreg because of bradycardia. Consult cardiology. #2 nonsustained V. tach: Patient's the potassium is 3.8, check MG.. Monitor, patient did have some dizziness when the episode of V. tach happen. We will order echocardiogram. Patient moved from [place], One year ago but has no primary cardiologist, I did not see any medical records in Epic. #3 diabetes mellitus type 2: New card modified diet #4 history of COPD: Patient on Spiriva. No wheezing. Quit smoking 5 years ago. All the records are reviewed and case discussed with ED provider. Management plans discussed with the patient, family and they are in agreement.  CODE STATUS: full  TOTAL TIME TAKING CARE OF THIS PATIENT: 55 minutes.    Epifanio Lesches M.D on 06/16/2016 at 8:37 PM  Between 7am to 6pm - Pager - [phone]  After 6pm go to [email or web address] - password [email or web address] [facility]  Office  [phone]  CC: Primary care physician; Sabino Snipes KEY, MD  Note: This dictation was prepared with Dragon dictation along with smaller phrase technology. Any transcriptional errors that result from this process are unintentional.

## 2016-06-17 ENCOUNTER — Encounter: Payer: Self-pay | Admitting: Radiology

## 2016-06-17 ENCOUNTER — Observation Stay (HOSPITAL_BASED_OUTPATIENT_CLINIC_OR_DEPARTMENT_OTHER)
Admit: 2016-06-17 | Discharge: 2016-06-17 | Disposition: A | Discharge status: Home / Self Care | Payer: Medicare HMO | Attending: Internal Medicine | Admitting: Internal Medicine

## 2016-06-17 ENCOUNTER — Observation Stay (HOSPITAL_BASED_OUTPATIENT_CLINIC_OR_DEPARTMENT_OTHER): Payer: Medicare HMO

## 2016-06-17 DIAGNOSIS — I10 Essential (primary) hypertension: Secondary | ICD-10-CM | POA: Diagnosis not present

## 2016-06-17 DIAGNOSIS — I25118 Atherosclerotic heart disease of native coronary artery with other forms of angina pectoris: Secondary | ICD-10-CM

## 2016-06-17 DIAGNOSIS — R079 Chest pain, unspecified: Secondary | ICD-10-CM

## 2016-06-17 DIAGNOSIS — I2510 Atherosclerotic heart disease of native coronary artery without angina pectoris: Secondary | ICD-10-CM | POA: Diagnosis not present

## 2016-06-17 DIAGNOSIS — R072 Precordial pain: Secondary | ICD-10-CM | POA: Diagnosis not present

## 2016-06-17 HISTORY — DX: Atherosclerotic heart disease of native coronary artery with other forms of angina pectoris: I25.118

## 2016-06-17 LAB — ECHOCARDIOGRAM COMPLETE
HEIGHTINCHES: 67 in
WEIGHTICAEL: 4793.6 [oz_av]

## 2016-06-17 LAB — BASIC METABOLIC PANEL
Anion gap: 5 (ref 5–15)
BUN: 16 mg/dL (ref 6–20)
CALCIUM: 8.5 mg/dL — AB (ref 8.9–10.3)
CO2: 27 mmol/L (ref 22–32)
CREATININE: 0.84 mg/dL (ref 0.44–1.00)
Chloride: 110 mmol/L (ref 101–111)
GFR calc non Af Amer: >60 mL/min (ref >60)
Glucose, Bld: 116 mg/dL — ABNORMAL HIGH (ref 65–99)
Potassium: 3.5 mmol/L (ref 3.5–5.1)
SODIUM: 142 mmol/L (ref 135–145)

## 2016-06-17 LAB — CBC
HCT: 30.1 % — ABNORMAL LOW (ref 35.0–47.0)
Hemoglobin: 10.1 g/dL — ABNORMAL LOW (ref 12.0–16.0)
MCH: 27.6 pg (ref 26.0–34.0)
MCHC: 33.5 g/dL (ref 32.0–36.0)
MCV: 82.3 fL (ref 80.0–100.0)
Platelets: 192 K/uL (ref 150–440)
RBC: 3.66 MIL/uL — ABNORMAL LOW (ref 3.80–5.20)
RDW: 14.8 % — ABNORMAL HIGH (ref 11.5–14.5)
WBC: 6.7 K/uL (ref 3.6–11.0)

## 2016-06-17 LAB — LIPID PANEL
Cholesterol: 99 mg/dL (ref 0–200)
HDL: 46 mg/dL (ref >40)
LDL CALC: 40 mg/dL (ref 0–99)
Total CHOL/HDL Ratio: 2.2 RATIO
Triglycerides: 64 mg/dL (ref <150)
VLDL: 13 mg/dL (ref 0–40)

## 2016-06-17 LAB — TROPONIN I: Troponin I: <0.03 ng/mL (ref <0.03)

## 2016-06-17 LAB — GLUCOSE, CAPILLARY
GLUCOSE-CAPILLARY: 71 mg/dL (ref 65–99)
GLUCOSE-CAPILLARY: 127 mg/dL — AB (ref 65–99)
GLUCOSE-CAPILLARY: 181 mg/dL — AB (ref 65–99)
Glucose-Capillary: 63 mg/dL — ABNORMAL LOW (ref 65–99)

## 2016-06-17 MED ORDER — REGADENOSON 0.4 MG/5ML IV SOLN
0.4000 mg | Freq: Once | INTRAVENOUS | Status: AC
  Administered 2016-06-17: 0.4 mg via INTRAVENOUS

## 2016-06-17 MED ORDER — INSULIN ASPART 100 UNIT/ML ~~LOC~~ SOLN
0.0000 [IU] | Freq: Three times a day (TID) | SUBCUTANEOUS | Status: DC
  Administered 2016-06-18: 3 [IU] via SUBCUTANEOUS
  Filled 2016-06-17: qty 3

## 2016-06-17 MED ORDER — AMLODIPINE BESYLATE 5 MG PO TABS
5.0000 mg | ORAL_TABLET | Freq: Every day | ORAL | Status: DC
  Administered 2016-06-17 – 2016-06-18 (×2): 5 mg via ORAL
  Filled 2016-06-17 (×2): qty 1

## 2016-06-17 MED ORDER — INSULIN ASPART 100 UNIT/ML ~~LOC~~ SOLN: 0.0000 [IU] | Freq: Every day | SUBCUTANEOUS | Status: DC

## 2016-06-17 MED ORDER — TECHNETIUM TC 99M TETROFOSMIN IV KIT
32.8100 | PACK | Freq: Once | INTRAVENOUS | Status: AC | PRN
  Administered 2016-06-17: 32.81 via INTRAVENOUS

## 2016-06-17 NOTE — Consult Note (Signed)
Cardiology Consultation Note  Patient ID: Tina Hayes, MRN: [id number], DOB/AGE: Dec 28, 1945 71 y.o. Admit date: 06/16/2016   Date of Consult: 06/17/2016 Primary Physician: Sabino Snipes KEY, MD Primary Cardiologist: New to [facility] - consult by End (appointment with [place] 06/19/2016) Requesting Physician: Dr. Benjie Karvonen, MD  Chief Complaint: Chest pain Reason for Consult: Same  HPI: 71 y.o. 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  Patient moved to [place] shortly after her CABG and has not seen a cardiologist since. She is scheduled to see Dr. Yvone Neu on 06/19/2016 to establish care. Over the past 3 days she has noted substernal chest pain at rest, mostly at nighttime. No exertional pain. Pain is not a severe as her prior MI or when she had unstable angina at the time of her bypass. No associated symptoms. Because her pain persisted she presented to [facility].   Upon the patient's arrival to [facility] they were found to have SBP 160 mmHg, troponin negative x 5, WBC 5.4, HGB 10.8, PLT 206, SCr 0.74, K+ 3.8. ECG showed sinus bradycardia, 54 bpm, nonspecific st/t changes, CXR showed cardiomegaly, with atelectasis LLL. She is currently chest pain free. Nuclear stress test has been ordered by IM. Cardiology asked to see.   Past Medical History:  Diagnosis Date  . Anxiety   . CHF (congestive heart failure) ([place])   . Chronic back pain   . COPD (chronic obstructive pulmonary disease) ([place])   . Coronary artery disease   . Depression   . Diabetes mellitus without complication ([place])   . H/O heart artery stent 06/17/2016   5 stents per patient  . Hypertension   . Seizures ([place])       Most Recent Cardiac Studies: Requested from [facility] in [place], [place]   Surgical History:  Past  Surgical History:  Procedure Laterality Date  . ANKLE SURGERY    . CORONARY ARTERY BYPASS GRAFT    . KNEE SURGERY       Home Meds: Prior to Admission medications   Medication Sig Start Date End Date Taking? Authorizing Provider  atorvastatin (LIPITOR) 40 MG tablet Take 40 mg by mouth at bedtime.   Yes Historical Provider, MD  carvedilol (COREG) 6.25 MG tablet Take 6.25 mg by mouth 2 (two) times daily with a meal.   Yes Historical Provider, MD  cetirizine (ZYRTEC) 10 MG tablet Take 10 mg by mouth daily.   Yes Historical Provider, MD  citalopram (CELEXA) 20 MG tablet Take 20 mg by mouth daily.   Yes Historical Provider, MD  hydrALAZINE (APRESOLINE) 25 MG tablet Take 1 tablet (25 mg total) by mouth 2 (two) times daily. 06/05/15  Yes Ripudeep Krystal Eaton, MD  insulin glargine (LANTUS) 100 UNIT/ML injection Inject 0.2 mLs (20 Units total) into the skin at bedtime. Patient taking differently: Inject 20-30 Units into the skin 2 (two) times daily. Take 20 units in morning and 30 units at night 08/30/15  Yes Epifanio Lesches, MD  levETIRAcetam (KEPPRA) 750 MG tablet Take 1 tablet (750 mg total) by mouth 2 (two) times daily. 06/05/15  Yes Ripudeep Krystal Eaton, MD  losartan (COZAAR) 100 MG tablet Take 1 tablet (100 mg total) by mouth daily. 06/05/15  Yes Ripudeep Krystal Eaton, MD  meloxicam (MOBIC) 7.5 MG tablet  Take 1 tablet (7.5 mg total) by mouth daily. 06/05/15  Yes Ripudeep Krystal Eaton, MD  omeprazole (PRILOSEC) 40 MG capsule Take 1 capsule (40 mg total) by mouth 2 (two) times daily. 06/05/15  Yes Ripudeep Krystal Eaton, MD  tiotropium (SPIRIVA) 18 MCG inhalation capsule Place 18 mcg into inhaler and inhale daily.   Yes Historical Provider, MD  traZODone (DESYREL) 50 MG tablet Take 50 mg by mouth at bedtime.   Yes Historical Provider, MD  albuterol (PROVENTIL HFA;VENTOLIN HFA) 108 (90 Base) MCG/ACT inhaler Inhale 2 puffs into the lungs every 6 (six) hours as needed for wheezing or shortness of breath. 08/30/15   Epifanio Lesches, MD    insulin aspart (NOVOLOG) 100 UNIT/ML injection Inject 0-10 Units into the skin 3 (three) times daily with meals. Sliding scale CBG 70 - 120: 0 units CBG 121 - 150: 1 unit,  CBG 151 - 200: 2 units,  CBG 201 - 250: 3 units,  CBG 251 - 300: 5 units,  CBG 301 - 350: 7 units,  CBG 351 - 400: 9 units   CBG > 400: 10 units and notify your MD Patient not taking: Reported on 06/16/2016 06/05/15   Ripudeep Krystal Eaton, MD  traMADol (ULTRAM) 50 MG tablet Take 2 tablets (100 mg total) by mouth every 6 (six) hours as needed for moderate pain. 01/08/16   Molli Barrows, MD    Inpatient Medications:  . carvedilol  3.125 mg Oral BID WC  . citalopram  20 mg Oral Daily  . clopidogrel  75 mg Oral Daily  . docusate sodium  100 mg Oral BID  . enoxaparin (LOVENOX) injection  40 mg Subcutaneous BID  . hydrALAZINE  25 mg Oral BID  . insulin aspart  0-5 Units Subcutaneous QHS  . insulin aspart  0-9 Units Subcutaneous TID WC  . insulin glargine  20 Units Subcutaneous QHS  . levETIRAcetam  750 mg Oral BID  . losartan  100 mg Oral Daily  . meloxicam  7.5 mg Oral Daily  . nitroGLYCERIN  0.5 inch Topical Q6H  . pantoprazole  40 mg Oral Daily  . rosuvastatin  10 mg Oral Daily  . tiotropium  18 mcg Inhalation Daily  . traZODone  50 mg Oral QHS     Allergies:  Allergies  Allergen Reactions  . Aspirin Anaphylaxis    Social History   Social History  . Marital status: Widowed    Spouse name: N/A  . Number of children: N/A  . Years of education: N/A   Occupational History  . Not on file.   Social History Main Topics  . Smoking status: Former Research scientist (life sciences)  . Smokeless tobacco: Never Used  . Alcohol use No  . Drug use: No  . Sexual activity: Yes   Other Topics Concern  . Not on file   Social History Narrative  . No narrative on file     Family History  Problem Relation Age of Onset  . Diabetes    . Hypertension       Review of Systems: Review of Systems  Constitutional: Positive for malaise/fatigue.  Negative for chills, diaphoresis, fever and weight loss.  HENT: Negative for congestion.   Eyes: Negative for discharge and redness.  Respiratory: Negative for cough, hemoptysis, sputum production, shortness of breath and wheezing.   Cardiovascular: Positive for chest pain. Negative for palpitations, orthopnea, claudication, leg swelling and PND.  Gastrointestinal: Negative for abdominal pain, blood in stool, heartburn, melena, nausea and vomiting.  Genitourinary: Negative for hematuria.  Musculoskeletal: Negative for falls and myalgias.  Skin: Negative for rash.  Neurological: Negative for dizziness, tingling, tremors, sensory change, speech change, focal weakness, loss of consciousness and weakness.  Endo/Heme/Allergies: Does not bruise/bleed easily.  Psychiatric/Behavioral: Negative for substance abuse. The patient is not nervous/anxious.   All other systems reviewed and are negative.   Labs:  Recent Labs  06/16/16 1941 06/16/16 2225 06/17/16 0229 06/17/16 0817  TROPONINI <0.03 <0.03 <0.03 <0.03   Lab Results  Component Value Date   WBC 6.7 06/17/2016   HGB 10.1 (L) 06/17/2016   HCT 30.1 (L) 06/17/2016   MCV 82.3 06/17/2016   PLT 192 06/17/2016     Recent Labs Lab 06/17/16 0229  NA 142  K 3.5  CL 110  CO2 27  BUN 16  CREATININE 0.84  CALCIUM 8.5*  GLUCOSE 116*   Lab Results  Component Value Date   CHOL 99 06/17/2016   HDL 46 06/17/2016   LDLCALC 40 06/17/2016   TRIG 64 06/17/2016   No results found for: DDIMER  Radiology/Studies:  Dg Chest 2 View  Result Date: 06/16/2016 CLINICAL DATA:  Chest pain and cough EXAM: CHEST  2 VIEW COMPARISON:  08/28/2015 FINDINGS: Post sternotomy changes. No acute consolidation or pleural effusion. Linear atelectasis in the left lower lung. Mild cardiomegaly without overt failure. No pneumothorax. Suspect moderate hiatal hernia. Degenerative changes of the spine. IMPRESSION: 1. Mild cardiomegaly without overt failure. 2.  Linear atelectasis in the left lower lung.  No acute infiltrate 3. Suspect moderate hiatal hernia. Electronically Signed   By: Donavan Foil M.D.   On: 06/16/2016 18:07    EKG: Interpreted by me showed: sinus bradycardia, 54 bpm, nonspecific st/t changes Telemetry: Interpreted by me showed: sinus bradycardia, 50's  Weights: Filed Weights   06/16/16 1707 06/16/16 2241  Weight: (!) 304 lb (137.9 kg) 299 lb 9.6 oz (135.9 kg)     Physical Exam: Blood pressure (!) 160/88, pulse 61, temperature 98.4 F (36.9 C), temperature source Oral, resp. rate 18, height 5\' 7"  (1.702 m), weight 299 lb 9.6 oz (135.9 kg), SpO2 99 %. Body mass index is 46.92 kg/m. General: Well developed, well nourished, in no acute distress. Head: Normocephalic, atraumatic, sclera non-icteric, no xanthomas, nares are without discharge.  Neck: Negative for carotid bruits. JVD not elevated. Lungs: Clear bilaterally to auscultation without wheezes, rales, or rhonchi. Breathing is unlabored. Heart: RRR with S1 S2. No murmurs, rubs, or gallops appreciated. Abdomen: Soft, non-tender, non-distended with normoactive bowel sounds. No hepatomegaly. No rebound/guarding. No obvious abdominal masses. Msk:  Strength and tone appear normal for age. Extremities: No clubbing or cyanosis. No edema. Distal pedal pulses are 2+ and equal bilaterally. Neuro: Alert and oriented X 3. No facial asymmetry. No focal deficit. Moves all extremities spontaneously. Psych:  Responds to questions appropriately with a normal affect.    Assessment and Plan:  Principal Problem:   Chest pain with moderate risk for cardiac etiology Active Problems:   CAD in native artery   Morbid obesity ([place])   Accelerated hypertension   COPD (chronic obstructive pulmonary disease) ([place])    1. Chest pain with moderate risk fo cardiac etiology: -Currently chest pain-free -Has ruled out -Echo pending -For Lexiscan Myoview this morning per IM -Further  recommendations pending nuclear stress test  2. CAD s/p remote MI with PCI and CABG in the setting of unstable angina: -Nuclear stress test as above -Continue Plavix, not on ASA 2/2 allergy with anaphylaxis with  ASA -Continue Coreg, losartan, Crestor  2. Accelerated HTN: -Add amlodipine and titrate to goal BP -Would likely discontinue hydralazine -Continue remaining regimen   Signed, Christell Faith, PA-C [facility] Pager: [phone] 06/17/2016, 1:01 PM

## 2016-06-17 NOTE — Progress Notes (Signed)
*  PRELIMINARY RESULTS* Echocardiogram 2D Echocardiogram has been performed.  Sherrie Sport 06/17/2016, 3:23 PM

## 2016-06-17 NOTE — Progress Notes (Signed)
Inpatient Diabetes Program Recommendations  AACE/ADA: New Consensus Statement on Inpatient Glycemic Control (2015)  Target Ranges:  Prepandial:   less than 140 mg/dL      Peak postprandial:   less than 180 mg/dL (1-2 hours)      Critically ill patients:  140 - 180 mg/dL  Results for Tina, Hayes (MRN [id number]) as of 06/17/2016 09:55  Ref. Range 06/16/2016 23:31 06/17/2016 07:43  Glucose-Capillary Latest Ref Range: 65 - 99 mg/dL 132 (H) 63 (L)  Results for Tina, Hayes (MRN [id number]) as of 06/17/2016 09:55  Ref. Range 06/16/2016 17:12 06/17/2016 02:29  Glucose Latest Ref Range: 65 - 99 mg/dL 142 (H) 116 (H)    Review of Glycemic Control  Diabetes history: DM2 Outpatient Diabetes medications: Lantus 20 units QAM, Lantus 30 units QHS, Novolog 0-10 units TID with meals Current orders for Inpatient glycemic control: Lantus 20 units QHS  Inpatient Diabetes Program Recommendations: Correction (SSI): Please consider ordering CBGs with Novolog sensitive correction scale Q4H while NPO and change to ACHS when diet resumed. HgbA1C: Please consider ordering an A1C to evaluate glycemic control over the past 2-3 months.  NOTE: Patient received Lantus 20 units at 23:25 on 06/16/16 and fasting glucose 63 mg/dl this morning and patient is NPO. Please order CBGs and Novolog sensitive correction Q4H while NPO and change to ACHS when diet resumed.  Also, please consider adding an A1C to blood in lab if available (if not draw with next scheduled blood draw.  Thanks, Barnie Alderman, RN, MSN, CDE Diabetes Coordinator Inpatient Diabetes Program [phone] (Team Pager from 8am to 5pm)

## 2016-06-17 NOTE — Plan of Care (Signed)
Problem: Safety: Goal: Ability to remain free from injury will improve Outcome: Progressing Fall precautions in place  Problem: Pain Managment: Goal: General experience of comfort will improve Outcome: Progressing Prn medication  Problem: Tissue Perfusion: Goal: Risk factors for ineffective tissue perfusion will decrease Outcome: Progressing SQ Lovenox   Problem: Cardiac: Goal: Ability to achieve and maintain adequate cardiovascular perfusion will improve Outcome: Progressing Stress test today

## 2016-06-17 NOTE — Progress Notes (Signed)
[place] [facility] [place] NAME: Tina Hayes    MR#:  [id number]  DATE OF BIRTH:  [birth year]  SUBJECTIVE:  No chest pain overnight  REVIEW OF SYSTEMS:    Review of Systems  Constitutional: Negative.  Negative for chills, fever and malaise/fatigue.  HENT: Negative.  Negative for ear discharge, ear pain, hearing loss, nosebleeds and sore throat.   Eyes: Negative.  Negative for blurred vision and pain.  Respiratory: Negative.  Negative for cough, hemoptysis, shortness of breath and wheezing.   Cardiovascular: Negative.  Negative for chest pain, palpitations and leg swelling.  Gastrointestinal: Negative.  Negative for abdominal pain, blood in stool, diarrhea, nausea and vomiting.  Genitourinary: Negative.  Negative for dysuria.  Musculoskeletal: Negative.  Negative for back pain.  Skin: Negative.   Neurological: Negative for dizziness, tremors, speech change, focal weakness, seizures and headaches.  Endo/Heme/Allergies: Negative.  Does not bruise/bleed easily.  Psychiatric/Behavioral: Negative.  Negative for depression, hallucinations and suicidal ideas.    Tolerating Diet:npo      DRUG ALLERGIES:   Allergies  Allergen Reactions  . Aspirin Anaphylaxis    VITALS:  Blood pressure (!) 149/63, pulse (!) 58, temperature 98.2 F (36.8 C), temperature source Oral, resp. rate 17, height 5\' 7"  (1.702 m), weight 135.9 kg (299 lb 9.6 oz), SpO2 96 %.  PHYSICAL EXAMINATION:   Physical Exam  Constitutional: She is oriented to person, place, and time. No distress.  obese  HENT:  Head: Normocephalic.  Eyes: No scleral icterus.  Neck: Normal range of motion. Neck supple. No JVD present. No tracheal deviation present.  Cardiovascular: Normal rate, regular rhythm and normal heart sounds.  Exam reveals no gallop and no friction rub.   No murmur heard. Pulmonary/Chest: Effort normal and breath sounds normal. No respiratory distress. She has no wheezes.  She has no rales. She exhibits no tenderness.  Abdominal: Soft. Bowel sounds are normal. She exhibits no distension and no mass. There is no tenderness. There is no rebound and no guarding.  Musculoskeletal: Normal range of motion. She exhibits no edema.  Neurological: She is alert and oriented to person, place, and time.  Skin: Skin is warm. No rash noted. No erythema.  Psychiatric: Affect and judgment normal.      LABORATORY PANEL:   CBC  Recent Labs Lab 06/17/16 0229  WBC 6.7  HGB 10.1*  HCT 30.1*  PLT 192   ------------------------------------------------------------------------------------------------------------------  Chemistries   Recent Labs Lab 06/16/16 1941 06/17/16 0229  NA  --  142  K  --  3.5  CL  --  110  CO2  --  27  GLUCOSE  --  116*  BUN  --  16  CREATININE  --  0.84  CALCIUM  --  8.5*  MG 1.8  --    ------------------------------------------------------------------------------------------------------------------  Cardiac Enzymes  Recent Labs Lab 06/16/16 2225 06/17/16 0229 06/17/16 0817  TROPONINI <0.03 <0.03 <0.03   ------------------------------------------------------------------------------------------------------------------  RADIOLOGY:  Dg Chest 2 View  Result Date: 06/16/2016 CLINICAL DATA:  Chest pain and cough EXAM: CHEST  2 VIEW COMPARISON:  08/28/2015 FINDINGS: Post sternotomy changes. No acute consolidation or pleural effusion. Linear atelectasis in the left lower lung. Mild cardiomegaly without overt failure. No pneumothorax. Suspect moderate hiatal hernia. Degenerative changes of the spine. IMPRESSION: 1. Mild cardiomegaly without overt failure. 2. Linear atelectasis in the left lower lung.  No acute infiltrate 3. Suspect moderate hiatal hernia. Electronically Signed   By: Madie Reno.D.  On: 06/16/2016 18:07     ASSESSMENT AND PLAN:   71 year old female with a history of diabetes, essential hypertension and coronary  artery disease status post CABG who presents with chest pain.  1.chest pain Patient has ruled out for ACS with negative troponins. She is to undergo cardiac stress test. Await cardiology consultation. Patient is allergic to aspirin. Continue Plavix, Coreg and Crestor. 2. Essential hypertension: Continue Coreg, hydralazine and losartan  3. Diabetes: Continue Lantus with sliding scale  4. History of seizure disorder: Continue Keppra  5. Depression: Continue Celexa  6. Morbid obesity: Encouraged weight loss tolerated and dietary discretion.  Management plans discussed with the patient and she is in agreement.  CODE STATUS: full  TOTAL TIME TAKING CARE OF THIS PATIENT: 30 minutes.     POSSIBLE D/C tomorrow, DEPENDING ON CLINICAL CONDITION.   Tricia Pledger M.D on 06/17/2016 at 11:32 AM  Between 7am to 6pm - Pager - [phone] After 6pm go to [email or web address] - password [email or web address] [facility]  Office  [phone]  CC: Primary care physician; Sabino Snipes KEY, MD  Note: This dictation was prepared with Dragon dictation along with smaller phrase technology. Any transcriptional errors that result from this process are unintentional.

## 2016-06-17 NOTE — Progress Notes (Signed)
To nuclear medicine via bed 

## 2016-06-17 NOTE — Progress Notes (Signed)
Back from Nuclear medicine 

## 2016-06-18 ENCOUNTER — Observation Stay: Payer: Medicare HMO

## 2016-06-18 DIAGNOSIS — J432 Centrilobular emphysema: Secondary | ICD-10-CM | POA: Diagnosis not present

## 2016-06-18 DIAGNOSIS — I2510 Atherosclerotic heart disease of native coronary artery without angina pectoris: Secondary | ICD-10-CM | POA: Diagnosis not present

## 2016-06-18 DIAGNOSIS — I10 Essential (primary) hypertension: Secondary | ICD-10-CM

## 2016-06-18 DIAGNOSIS — R079 Chest pain, unspecified: Secondary | ICD-10-CM

## 2016-06-18 DIAGNOSIS — R072 Precordial pain: Secondary | ICD-10-CM | POA: Diagnosis not present

## 2016-06-18 DIAGNOSIS — I509 Heart failure, unspecified: Secondary | ICD-10-CM

## 2016-06-18 LAB — NM MYOCAR MULTI W/SPECT W/WALL MOTION / EF
CSEPHR: 49 %
LVDIAVOL: 109 mL (ref 46–106)
LVSYSVOL: 60 mL
Peak HR: 75 {beats}/min
Rest HR: 75 {beats}/min
TID: 0.93

## 2016-06-18 LAB — GLUCOSE, CAPILLARY
Glucose-Capillary: 88 mg/dL (ref 65–99)
Glucose-Capillary: 215 mg/dL — ABNORMAL HIGH (ref 65–99)

## 2016-06-18 LAB — HEMOGLOBIN A1C
HEMOGLOBIN A1C: 8.5 % — AB (ref 4.8–5.6)
Mean Plasma Glucose: 197

## 2016-06-18 MED ORDER — ROSUVASTATIN CALCIUM 10 MG PO TABS: 10.0000 mg | ORAL_TABLET | Freq: Every day | ORAL | 0 refills | Status: DC

## 2016-06-18 MED ORDER — ISOSORBIDE MONONITRATE ER 30 MG PO TB24: 30.0000 mg | ORAL_TABLET | Freq: Every day | ORAL | 0 refills | Status: DC

## 2016-06-18 MED ORDER — TRAMADOL HCL 50 MG PO TABS
50.0000 mg | ORAL_TABLET | Freq: Four times a day (QID) | ORAL | Status: DC | PRN
  Administered 2016-06-18: 50 mg via ORAL
  Filled 2016-06-18: qty 1

## 2016-06-18 MED ORDER — CLOPIDOGREL BISULFATE 75 MG PO TABS: 75.0000 mg | ORAL_TABLET | Freq: Every day | ORAL | 0 refills | Status: DC

## 2016-06-18 MED ORDER — AMLODIPINE BESYLATE 5 MG PO TABS: 5.0000 mg | ORAL_TABLET | Freq: Every day | ORAL | 0 refills | Status: DC

## 2016-06-18 MED ORDER — INSULIN GLARGINE 100 UNIT/ML ~~LOC~~ SOLN: 20.0000 [IU] | Freq: Two times a day (BID) | SUBCUTANEOUS | 0 refills | Status: DC

## 2016-06-18 MED ORDER — ISOSORBIDE MONONITRATE ER 30 MG PO TB24
30.0000 mg | ORAL_TABLET | Freq: Every day | ORAL | Status: DC
  Administered 2016-06-18: 30 mg via ORAL
  Filled 2016-06-18: qty 1

## 2016-06-18 MED ORDER — CARVEDILOL 3.125 MG PO TABS: 3.1250 mg | ORAL_TABLET | Freq: Two times a day (BID) | ORAL | 0 refills | Status: DC

## 2016-06-18 MED ORDER — TECHNETIUM TC 99M TETROFOSMIN IV KIT
29.8800 | PACK | Freq: Once | INTRAVENOUS | Status: AC | PRN
  Administered 2016-06-18: 29.88 via INTRAVENOUS

## 2016-06-18 NOTE — Progress Notes (Signed)
Patient off the unit to radiology, unable to recheck BP

## 2016-06-18 NOTE — Progress Notes (Signed)
Patient is being discharge home today, discharge instruction provided, IV removed, new medication order and changes reviewed with patient , tele removed patient discharge home

## 2016-06-18 NOTE — Progress Notes (Signed)
Patient BP 152/109  HR 58, patient schedule med administer, will recheck BP

## 2016-06-18 NOTE — Progress Notes (Signed)
Inpatient Diabetes Program Recommendations  AACE/ADA: New Consensus Statement on Inpatient Glycemic Control (2015)  Target Ranges:  Prepandial:   less than 140 mg/dL      Peak postprandial:   less than 180 mg/dL (1-2 hours)      Critically ill patients:  140 - 180 mg/dL   Results for JODETTE, BRANDWEIN (MRN [id number]) as of 06/18/2016 10:33  Ref. Range 06/17/2016 07:43 06/17/2016 11:56 06/17/2016 17:18 06/17/2016 21:06  Glucose-Capillary Latest Ref Range: 65 - 99 mg/dL 63 (L) 71 127 (H) 181 (H)   Results for ELLIEMAE, KOLLIE (MRN [id number]) as of 06/18/2016 10:33  Ref. Range 06/18/2016 07:54  Glucose-Capillary Latest Ref Range: 65 - 99 mg/dL 88      Review of Glycemic Control  Diabetes history: DM2  Outpatient Diabetes meds: Lantus 20 units QAM, Lantus 30 units QHS, Novolog 0-10 units TID with meals  Current Orders: Lantus 20 units QHS      Novolog Sensitive Correction Scale/ SSI (0-9 units) TID AC + HS     MD- Please consider reducing Lantus slightly to 18 units QHS for now  CBG only 88 mg/dl this AM and pt with mild Hypoglycemia yesterday AM as well     --Will follow patient during hospitalization--  Wyn Quaker RN, MSN, CDE Diabetes Coordinator Inpatient Glycemic Control Team Team Pager: [phone] (8a-5p)

## 2016-06-18 NOTE — Progress Notes (Signed)
Patient Name: Tina Hayes Date of Encounter: 06/18/2016  Primary Cardiologist: New to [facility] - consult by [facility] Problem List     Principal Problem:   Chest pain with moderate risk for cardiac etiology Active Problems:   CAD in native artery   Morbid obesity ([place])   Accelerated hypertension   COPD (chronic obstructive pulmonary disease) (HCC)     Subjective   No acute overnight events. For 2nd part of Myoview today. Received records from [facility] that indicted she had CABG x 3 in either 2000 or 2012 (notes indicate both dates). She has had frequent ED visits and admissions for chest pain even after her bypass surgery. Prior to this admission her most recent stress test was May 2016 and normal Echo from 02/2015 showed EF 45-50%. Echo this admission shows improved EF.   Inpatient Medications    Scheduled Meds: . amLODipine  5 mg Oral Daily  . carvedilol  3.125 mg Oral BID WC  . citalopram  20 mg Oral Daily  . clopidogrel  75 mg Oral Daily  . docusate sodium  100 mg Oral BID  . enoxaparin (LOVENOX) injection  40 mg Subcutaneous BID  . insulin aspart  0-5 Units Subcutaneous QHS  . insulin aspart  0-9 Units Subcutaneous TID WC  . insulin glargine  20 Units Subcutaneous QHS  . levETIRAcetam  750 mg Oral BID  . losartan  100 mg Oral Daily  . meloxicam  7.5 mg Oral Daily  . nitroGLYCERIN  0.5 inch Topical Q6H  . pantoprazole  40 mg Oral Daily  . rosuvastatin  10 mg Oral Daily  . tiotropium  18 mcg Inhalation Daily  . traZODone  50 mg Oral QHS   Continuous Infusions:  PRN Meds: acetaminophen **OR** acetaminophen, albuterol, bisacodyl, ondansetron **OR** ondansetron (ZOFRAN) IV, traZODone   Vital Signs    Vitals:   06/17/16 1200 06/17/16 2008 06/18/16 0054 06/18/16 0644  BP: (!) 160/88 137/69 (!) 153/55 137/61  Pulse: 61 62 (!) 57 (!) 55  Resp: 18 18  18   Temp: 98.4 F (36.9 C) 98.5 F (36.9 C)  98.1 F (36.7 C)  TempSrc: Oral Oral  Oral    SpO2: 99% 96%  99%  Weight:    (!) 300 lb 1.6 oz (136.1 kg)  Height:        Intake/Output Summary (Last 24 hours) at 06/18/16 0734 Last data filed at 06/18/16 0313  Gross per 24 hour  Intake              240 ml  Output                0 ml  Net              240 ml   Filed Weights   06/16/16 1707 06/16/16 2241 06/18/16 0644  Weight: (!) 304 lb (137.9 kg) 299 lb 9.6 oz (135.9 kg) (!) 300 lb 1.6 oz (136.1 kg)    Physical Exam    GEN: Well nourished, well developed, in no acute distress.  HEENT: Grossly normal.  Neck: Supple, no JVD, carotid bruits, or masses. Cardiac: RRR, no murmurs, rubs, or gallops. No clubbing, cyanosis, edema.  Radials/DP/PT 2+ and equal bilaterally.  Respiratory:  Respirations regular and unlabored, clear to auscultation bilaterally. GI: Soft, nontender, nondistended, BS + x 4. MS: no deformity or atrophy. Skin: warm and dry, no rash. Neuro:  Strength and sensation are intact. Psych: AAOx3.  Normal affect.  Labs    CBC  Recent Labs  06/16/16 1712 06/17/16 0229  WBC 5.4 6.7  HGB 10.8* 10.1*  HCT 32.2* 30.1*  MCV 83.1 82.3  PLT 206 [id number]   Basic Metabolic Panel  Recent Labs  06/16/16 1712 06/16/16 1941 06/17/16 0229  NA 141  --  142  K 3.8  --  3.5  CL 108  --  110  CO2 26  --  27  GLUCOSE 142*  --  116*  BUN 14  --  16  CREATININE 0.74  --  0.84  CALCIUM 8.6*  --  8.5*  MG  --  1.8  --    Liver Function Tests No results for input(s): AST, ALT, ALKPHOS, BILITOT, PROT, ALBUMIN in the last 72 hours. No results for input(s): LIPASE, AMYLASE in the last 72 hours. Cardiac Enzymes  Recent Labs  06/16/16 2225 06/17/16 0229 06/17/16 0817  TROPONINI <0.03 <0.03 <0.03   BNP Invalid input(s): POCBNP D-Dimer No results for input(s): DDIMER in the last 72 hours. Hemoglobin A1C  Recent Labs  06/17/16 0229  HGBA1C 8.5*   Fasting Lipid Panel  Recent Labs  06/17/16 0817  CHOL 99  HDL 46  LDLCALC 40  TRIG 64  CHOLHDL 2.2    Thyroid Function Tests No results for input(s): TSH, T4TOTAL, T3FREE, THYROIDAB in the last 72 hours.  Invalid input(s): FREET3  Telemetry    NSR, occasional PVCs - Personally Reviewed  ECG    n/a - Personally Reviewed  Radiology    Dg Chest 2 View  Result Date: 06/16/2016 CLINICAL DATA:  Chest pain and cough EXAM: CHEST  2 VIEW COMPARISON:  08/28/2015 FINDINGS: Post sternotomy changes. No acute consolidation or pleural effusion. Linear atelectasis in the left lower lung. Mild cardiomegaly without overt failure. No pneumothorax. Suspect moderate hiatal hernia. Degenerative changes of the spine. IMPRESSION: 1. Mild cardiomegaly without overt failure. 2. Linear atelectasis in the left lower lung.  No acute infiltrate 3. Suspect moderate hiatal hernia. Electronically Signed   By: Donavan Foil M.D.   On: 06/16/2016 18:07    Cardiac Studies   TTE 06/17/16: Study Conclusions  - Left ventricle: The cavity size was normal. Wall thickness was   increased increased in a pattern of mild to moderate LVH.   Systolic function was normal. The estimated ejection fraction was   in the range of 60% to 65%. Regional wall motion abnormalities   cannot be excluded. Doppler parameters are consistent with   abnormal left ventricular relaxation (grade 1 diastolic   dysfunction). - Mitral valve: Calcified annulus. There was mild regurgitation. - Left atrium: The atrium was mildly dilated. - Right ventricle: The cavity size was normal. Systolic function   was normal. - Tricuspid valve: There was moderate regurgitation. - Pulmonary arteries: Systolic pressure was mildly to moderately   increased.  Patient Profile     71 y.o. female with history of CAD s/p remote MI with remote PCI x 5 s/p 3 vessel CABG in either 2000 or 2012 (notes indicate both) in [place], [place] at [facility], COPD 2/2 tobacco abuse, HTN, and seizure disorder who presented to [facility] with 3 day history of  intermittent chest pain.  Assessment & Plan    1. Chest pain with moderate risk fo cardiac etiology: -Currently chest pain-free -Has ruled out -Echo shhowed improved EF when compared to study in 02/2015 -Frequent admissions and ED visits for chest pain with normal cardiac work up -For part 2 of [employer]  Myoview -Further recommendations pending nuclear stress test  2. CAD s/p remote MI with PCI and CABG in the setting of unstable angina: -Nuclear stress test as above -Continue Plavix, not on ASA 2/2 allergy with anaphylaxis with ASA -Continue Coreg, losartan, Crestor  3. Accelerated HTN: -Improved -Continue current medications   Signed, Marcille Blanco [facility] Pager: [phone] 06/18/2016, 7:34 AM

## 2016-06-18 NOTE — Care Management Obs Status (Signed)
[place] NOTIFICATION   Patient Details  Name: Tina Hayes MRN: [id number] Date of Birth: 01-Jan-1946   Medicare Observation Status Notification Given:  Yes Gave notice 2.20.2018.  Forgot to document   Katrina Stack, RN 06/18/2016, 8:52 AM

## 2016-06-18 NOTE — Discharge Summary (Signed)
[place] [facility] [place] NAME: Tina Hayes    MR#:  [id number]  DATE OF BIRTH:  1945-12-20  DATE OF ADMISSION:  06/16/2016 ADMITTING PHYSICIAN: Epifanio Lesches, MD  DATE OF DISCHARGE: 06/18/2016  PRIMARY CARE PHYSICIAN: SOLES, MEREDITH KEY, MD    ADMISSION DIAGNOSIS:  Chest pain, unspecified type [R07.9]  DISCHARGE DIAGNOSIS:  Principal Problem:   Chest pain with moderate risk for cardiac etiology Active Problems:   COPD (chronic obstructive pulmonary disease) (HCC)   CAD in native artery   Morbid obesity ([place])   Accelerated hypertension   CHF (congestive heart failure) ([place])   SECONDARY DIAGNOSIS:   Past Medical History:  Diagnosis Date  . Anxiety   . CHF (congestive heart failure) ([place])   . Chronic back pain   . COPD (chronic obstructive pulmonary disease) ([place])   . Coronary artery disease   . Depression   . Diabetes mellitus without complication ([place])   . H/O heart artery stent 06/17/2016   5 stents per patient  . Hypertension   . Seizures [facility])     HOSPITAL COURSE:   71 year old female with a history of diabetes, essential hypertension and coronary artery disease status post CABG who presents with chest pain.  1.chest pain Patient has ruled out for ACS with negative troponins. She underwent cardiac stress test which was read as low risk for ischemia. I discussed findings with Dr Rockey Situ.  Patient is allergic to aspirin. Continue Plavix,Imdur, Coreg and Statin  2. Essential hypertension: Continue Imdur, Coreg, norvasc and losartan Hydralazine discontinued by cardiology and in its place norvasc and imdue for antiangina effects.  3. Diabetes: Continue Lantus with ADA diet 4. History of seizure disorder: Continue Keppra  5. Depression: Continue Celexa  6. Morbid obesity: She is encouraged weight loss tolerated and dietary discretion.  7. Chronic diastolic heart failure with preserved ejection fraction:  Echocardiogram shows EF of 60-65% with stage I diastolic dysfunction.  DISCHARGE CONDITIONS AND DIET:   Stable Diabetic heart healthy  CONSULTS OBTAINED:  Treatment Team:  Nelva Bush, MD  DRUG ALLERGIES:   Allergies  Allergen Reactions  . Aspirin Anaphylaxis    DISCHARGE MEDICATIONS:   Current Discharge Medication List    START taking these medications   Details  amLODipine (NORVASC) 5 MG tablet Take 1 tablet (5 mg total) by mouth daily. Qty: 30 tablet, Refills: 0    clopidogrel (PLAVIX) 75 MG tablet Take 1 tablet (75 mg total) by mouth daily. Qty: 30 tablet, Refills: 0    isosorbide mononitrate (IMDUR) 30 MG 24 hr tablet Take 1 tablet (30 mg total) by mouth daily. Qty: 30 tablet, Refills: 0    rosuvastatin (CRESTOR) 10 MG tablet Take 1 tablet (10 mg total) by mouth daily. Qty: 30 tablet, Refills: 0      CONTINUE these medications which have CHANGED   Details  carvedilol (COREG) 3.125 MG tablet Take 1 tablet (3.125 mg total) by mouth 2 (two) times daily with a meal. Qty: 60 tablet, Refills: 0    insulin glargine (LANTUS) 100 UNIT/ML injection Inject 0.2-0.3 mLs (20-30 Units total) into the skin 2 (two) times daily. Take 20 units in morning and 30 units at night Qty: 16.8 mL, Refills: 0      CONTINUE these medications which have NOT CHANGED   Details  cetirizine (ZYRTEC) 10 MG tablet Take 10 mg by mouth daily.    citalopram (CELEXA) 20 MG tablet Take 20 mg by mouth daily.  levETIRAcetam (KEPPRA) 750 MG tablet Take 1 tablet (750 mg total) by mouth 2 (two) times daily. Qty: 60 tablet, Refills: 3    losartan (COZAAR) 100 MG tablet Take 1 tablet (100 mg total) by mouth daily. Qty: 30 tablet, Refills: 3    meloxicam (MOBIC) 7.5 MG tablet Take 1 tablet (7.5 mg total) by mouth daily. Qty: 30 tablet, Refills: 1    omeprazole (PRILOSEC) 40 MG capsule Take 1 capsule (40 mg total) by mouth 2 (two) times daily. Qty: 60 capsule, Refills: 3    tiotropium  (SPIRIVA) 18 MCG inhalation capsule Place 18 mcg into inhaler and inhale daily.    traZODone (DESYREL) 50 MG tablet Take 50 mg by mouth at bedtime.    albuterol (PROVENTIL HFA;VENTOLIN HFA) 108 (90 Base) MCG/ACT inhaler Inhale 2 puffs into the lungs every 6 (six) hours as needed for wheezing or shortness of breath. Qty: 1 Inhaler, Refills: 2    insulin aspart (NOVOLOG) 100 UNIT/ML injection Inject 0-10 Units into the skin 3 (three) times daily with meals. Sliding scale CBG 70 - 120: 0 units CBG 121 - 150: 1 unit,  CBG 151 - 200: 2 units,  CBG 201 - 250: 3 units,  CBG 251 - 300: 5 units,  CBG 301 - 350: 7 units,  CBG 351 - 400: 9 units   CBG > 400: 10 units and notify your MD Qty: 10 mL, Refills: 11    traMADol (ULTRAM) 50 MG tablet Take 2 tablets (100 mg total) by mouth every 6 (six) hours as needed for moderate pain. Qty: 180 tablet, Refills: 1      STOP taking these medications     atorvastatin (LIPITOR) 40 MG tablet      hydrALAZINE (APRESOLINE) 25 MG tablet           Today   CHIEF COMPLAINT:  Doing well has shoulder pain/arthritis which refers to her chest wall   VITAL SIGNS:  Blood pressure 140/63, pulse (!) 50, temperature 99 F (37.2 C), temperature source Oral, resp. rate 18, height 5\' 7"  (1.702 m), weight (!) 136.1 kg (300 lb 1.6 oz), SpO2 100 %.   REVIEW OF SYSTEMS:  Review of Systems  Constitutional: Negative.  Negative for chills, fever and malaise/fatigue.  HENT: Negative.  Negative for ear discharge, ear pain, hearing loss, nosebleeds and sore throat.   Eyes: Negative.  Negative for blurred vision and pain.  Respiratory: Negative.  Negative for cough, hemoptysis, shortness of breath and wheezing.   Cardiovascular: Negative.  Negative for chest pain, palpitations and leg swelling.  Gastrointestinal: Negative.  Negative for abdominal pain, blood in stool, diarrhea, nausea and vomiting.  Genitourinary: Negative.  Negative for dysuria.  Musculoskeletal:  Negative.  Negative for back pain.  Skin: Negative.   Neurological: Negative for dizziness, tremors, speech change, focal weakness, seizures and headaches.  Endo/Heme/Allergies: Negative.  Does not bruise/bleed easily.  Psychiatric/Behavioral: Negative.  Negative for depression, hallucinations and suicidal ideas.     PHYSICAL EXAMINATION:  GENERAL:  71 y.o.-year-old patient lying in the bed with no acute distress. obese NECK:  Supple, no jugular venous distention. No thyroid enlargement, no tenderness.  LUNGS: Normal breath sounds bilaterally, no wheezing, rales,rhonchi  No use of accessory muscles of respiration.  CARDIOVASCULAR: S1, S2 normal. No murmurs, rubs, or gallops. Chest wall tenderness worse with movement of right shoulder ABDOMEN: Soft, non-tender, non-distended. Bowel sounds present. No organomegaly or mass.  EXTREMITIES: No pedal edema, cyanosis, or clubbing.  PSYCHIATRIC: The patient is  alert and oriented x 3.  SKIN: No obvious rash, lesion, or ulcer.   DATA REVIEW:   CBC  Recent Labs Lab 06/17/16 0229  WBC 6.7  HGB 10.1*  HCT 30.1*  PLT 192    Chemistries   Recent Labs Lab 06/16/16 1941 06/17/16 0229  NA  --  142  K  --  3.5  CL  --  110  CO2  --  27  GLUCOSE  --  116*  BUN  --  16  CREATININE  --  0.84  CALCIUM  --  8.5*  MG 1.8  --     Cardiac Enzymes  Recent Labs Lab 06/16/16 2225 06/17/16 0229 06/17/16 0817  TROPONINI <0.03 <0.03 <0.03    Microbiology Results  @[email or web address] @  RADIOLOGY:  Dg Chest 2 View  Result Date: 06/16/2016 CLINICAL DATA:  Chest pain and cough EXAM: CHEST  2 VIEW COMPARISON:  08/28/2015 FINDINGS: Post sternotomy changes. No acute consolidation or pleural effusion. Linear atelectasis in the left lower lung. Mild cardiomegaly without overt failure. No pneumothorax. Suspect moderate hiatal hernia. Degenerative changes of the spine. IMPRESSION: 1. Mild cardiomegaly without overt failure. 2. Linear atelectasis in the  left lower lung.  No acute infiltrate 3. Suspect moderate hiatal hernia. Electronically Signed   By: Donavan Foil M.D.   On: 06/16/2016 18:07      Current Discharge Medication List    START taking these medications   Details  amLODipine (NORVASC) 5 MG tablet Take 1 tablet (5 mg total) by mouth daily. Qty: 30 tablet, Refills: 0    clopidogrel (PLAVIX) 75 MG tablet Take 1 tablet (75 mg total) by mouth daily. Qty: 30 tablet, Refills: 0    isosorbide mononitrate (IMDUR) 30 MG 24 hr tablet Take 1 tablet (30 mg total) by mouth daily. Qty: 30 tablet, Refills: 0    rosuvastatin (CRESTOR) 10 MG tablet Take 1 tablet (10 mg total) by mouth daily. Qty: 30 tablet, Refills: 0      CONTINUE these medications which have CHANGED   Details  carvedilol (COREG) 3.125 MG tablet Take 1 tablet (3.125 mg total) by mouth 2 (two) times daily with a meal. Qty: 60 tablet, Refills: 0    insulin glargine (LANTUS) 100 UNIT/ML injection Inject 0.2-0.3 mLs (20-30 Units total) into the skin 2 (two) times daily. Take 20 units in morning and 30 units at night Qty: 16.8 mL, Refills: 0      CONTINUE these medications which have NOT CHANGED   Details  cetirizine (ZYRTEC) 10 MG tablet Take 10 mg by mouth daily.    citalopram (CELEXA) 20 MG tablet Take 20 mg by mouth daily.    levETIRAcetam (KEPPRA) 750 MG tablet Take 1 tablet (750 mg total) by mouth 2 (two) times daily. Qty: 60 tablet, Refills: 3    losartan (COZAAR) 100 MG tablet Take 1 tablet (100 mg total) by mouth daily. Qty: 30 tablet, Refills: 3    meloxicam (MOBIC) 7.5 MG tablet Take 1 tablet (7.5 mg total) by mouth daily. Qty: 30 tablet, Refills: 1    omeprazole (PRILOSEC) 40 MG capsule Take 1 capsule (40 mg total) by mouth 2 (two) times daily. Qty: 60 capsule, Refills: 3    tiotropium (SPIRIVA) 18 MCG inhalation capsule Place 18 mcg into inhaler and inhale daily.    traZODone (DESYREL) 50 MG tablet Take 50 mg by mouth at bedtime.    albuterol  (PROVENTIL HFA;VENTOLIN HFA) 108 (90 Base) MCG/ACT inhaler Inhale 2 puffs into the  lungs every 6 (six) hours as needed for wheezing or shortness of breath. Qty: 1 Inhaler, Refills: 2    insulin aspart (NOVOLOG) 100 UNIT/ML injection Inject 0-10 Units into the skin 3 (three) times daily with meals. Sliding scale CBG 70 - 120: 0 units CBG 121 - 150: 1 unit,  CBG 151 - 200: 2 units,  CBG 201 - 250: 3 units,  CBG 251 - 300: 5 units,  CBG 301 - 350: 7 units,  CBG 351 - 400: 9 units   CBG > 400: 10 units and notify your MD Qty: 10 mL, Refills: 11    traMADol (ULTRAM) 50 MG tablet Take 2 tablets (100 mg total) by mouth every 6 (six) hours as needed for moderate pain. Qty: 180 tablet, Refills: 1      STOP taking these medications     atorvastatin (LIPITOR) 40 MG tablet      hydrALAZINE (APRESOLINE) 25 MG tablet         \  Management plans discussed with the patient and she is in agreement. Stable for discharge home   Patient should follow up with pcp  CODE STATUS:     Code Status Orders        Start     Ordered   06/16/16 2032  Full code  Continuous     06/16/16 2036    Code Status History    Date Active Date Inactive Code Status Order ID Comments User Context   08/28/2015 10:34 AM 08/30/2015  8:23 PM Full Code [id number]  Dustin Flock, MD ED   06/03/2015 10:38 PM 06/05/2015  8:44 PM Full Code [id number]  Brand Males, MD Inpatient   04/14/2015  5:24 PM 04/15/2015  8:06 PM Full Code ID:[id number]  Dustin Flock, MD Inpatient      TOTAL TIME TAKING CARE OF THIS PATIENT: 37 minutes.    Note: This dictation was prepared with Dragon dictation along with smaller phrase technology. Any transcriptional errors that result from this process are unintentional.  Brigett Estell M.D on 06/18/2016 at 11:43 AM  Between 7am to 6pm - Pager - [phone] After 6pm go to [email or web address] - password [email or web address]  [facility]  Office  [phone]  CC: Primary care physician; Sabino Snipes KEY, MD

## 2016-06-19 ENCOUNTER — Ambulatory Visit: Payer: Medicare Other | Admitting: Cardiology

## 2016-06-25 ENCOUNTER — Encounter: Payer: Medicare Other | Admitting: Internal Medicine

## 2016-06-30 ENCOUNTER — Encounter: Payer: Medicare Other | Admitting: Cardiovascular Disease

## 2016-06-30 ENCOUNTER — Ambulatory Visit: Payer: Medicare HMO | Attending: Family | Admitting: Family

## 2016-06-30 ENCOUNTER — Encounter: Payer: Self-pay | Admitting: Family

## 2016-06-30 VITALS — BP 169/74 | HR 58 | Resp 20 | Ht 71.0 in | Wt 305.2 lb

## 2016-06-30 DIAGNOSIS — I2510 Atherosclerotic heart disease of native coronary artery without angina pectoris: Secondary | ICD-10-CM | POA: Diagnosis not present

## 2016-06-30 DIAGNOSIS — J449 Chronic obstructive pulmonary disease, unspecified: Secondary | ICD-10-CM | POA: Diagnosis not present

## 2016-06-30 DIAGNOSIS — Z794 Long term (current) use of insulin: Secondary | ICD-10-CM | POA: Insufficient documentation

## 2016-06-30 DIAGNOSIS — G8929 Other chronic pain: Secondary | ICD-10-CM | POA: Insufficient documentation

## 2016-06-30 DIAGNOSIS — F419 Anxiety disorder, unspecified: Secondary | ICD-10-CM | POA: Insufficient documentation

## 2016-06-30 DIAGNOSIS — E669 Obesity, unspecified: Secondary | ICD-10-CM | POA: Insufficient documentation

## 2016-06-30 DIAGNOSIS — Z87891 Personal history of nicotine dependence: Secondary | ICD-10-CM | POA: Insufficient documentation

## 2016-06-30 DIAGNOSIS — Z951 Presence of aortocoronary bypass graft: Secondary | ICD-10-CM | POA: Diagnosis not present

## 2016-06-30 DIAGNOSIS — Z955 Presence of coronary angioplasty implant and graft: Secondary | ICD-10-CM | POA: Insufficient documentation

## 2016-06-30 DIAGNOSIS — I5032 Chronic diastolic (congestive) heart failure: Secondary | ICD-10-CM

## 2016-06-30 DIAGNOSIS — M549 Dorsalgia, unspecified: Secondary | ICD-10-CM | POA: Diagnosis not present

## 2016-06-30 DIAGNOSIS — I509 Heart failure, unspecified: Secondary | ICD-10-CM | POA: Diagnosis present

## 2016-06-30 DIAGNOSIS — F329 Major depressive disorder, single episode, unspecified: Secondary | ICD-10-CM | POA: Diagnosis not present

## 2016-06-30 DIAGNOSIS — R569 Unspecified convulsions: Secondary | ICD-10-CM | POA: Diagnosis not present

## 2016-06-30 DIAGNOSIS — I110 Hypertensive heart disease with heart failure: Secondary | ICD-10-CM | POA: Diagnosis not present

## 2016-06-30 DIAGNOSIS — R0683 Snoring: Secondary | ICD-10-CM

## 2016-06-30 DIAGNOSIS — E119 Type 2 diabetes mellitus without complications: Secondary | ICD-10-CM | POA: Diagnosis not present

## 2016-06-30 DIAGNOSIS — I10 Essential (primary) hypertension: Secondary | ICD-10-CM

## 2016-06-30 HISTORY — DX: Essential (primary) hypertension: I10

## 2016-06-30 MED ORDER — FUROSEMIDE 40 MG PO TABS: 40.0000 mg | ORAL_TABLET | Freq: Every day | ORAL | 3 refills | Status: DC

## 2016-06-30 MED ORDER — POTASSIUM CHLORIDE CRYS ER 20 MEQ PO TBCR: 20.0000 meq | EXTENDED_RELEASE_TABLET | Freq: Every day | ORAL | 3 refills | Status: DC

## 2016-06-30 NOTE — Progress Notes (Signed)
Patient ID: Tina Hayes, female    DOB: 10-Sep-1945, 71 y.o.   MRN: [id number]  HPI  Ms Madriaga is a 71 y/o female with a history of seizures, HTN, cardiac stents, DM, depression, CAD, COPD, chronic back pain, anxiety, obesity, remote tobacco use and chronic heart failure.   Last echo was done 06/17/16 and showed an EF of 60-65% along with mild MR and moderate TR. Pulmonary systolic pressure was mildly to moderately elevated. Stress test done 06/18/16.  Admitted 06/16/16 with chest pain. Had negative troponins. Cardiology consult done and patient underwent stress test which was read as low risk for ischemia. Medications were adjusted and she was discharged home after 2 days. Admitted 08/28/15 due to COPD exacerbation. Received IV steroids, nebulizers and antibiotics. She was discharged home after 2 days.   She presents today for her initial visit with fatigue and shortness of breath with little exertion. Symptoms quickly improve upon rest. Does note some edema in her bilateral lower legs as well as a tightness across her abdomen. No scales at home so hasn't been weighing herself. Occasionally has some chest heaviness when she lays down at night. Does endorse snoring and waking herself up gasping. Also wakes up fatigued. Has not had a sleep study done. Has chronic back pain along with left knee pain and is pending left knee replacement surgery in the future.   Past Medical History:  Diagnosis Date  . Anxiety   . CHF (congestive heart failure) ([place])   . Chronic back pain   . COPD (chronic obstructive pulmonary disease) ([place])   . Coronary artery disease   . Depression   . Diabetes mellitus without complication ([place])   . H/O heart artery stent 06/17/2016   5 stents per patient  . Hypertension   . Seizures ([place])    Past Surgical History:  Procedure Laterality Date  . ANKLE SURGERY    . CORONARY ARTERY BYPASS GRAFT    . KNEE SURGERY     Family History  Problem Relation Age of Onset  . Diabetes     . Hypertension     Social History  Substance Use Topics  . Smoking status: Former Smoker    Packs/day: 0.25    Years: 20.00    Types: Cigarettes    Quit date: 07/01/2006  . Smokeless tobacco: Never Used  . Alcohol use No   Allergies  Allergen Reactions  . Aspirin Anaphylaxis    Prior to Admission medications   Medication Sig Start Date End Date Taking? Authorizing Provider  albuterol (PROVENTIL HFA;VENTOLIN HFA) 108 (90 Base) MCG/ACT inhaler Inhale 2 puffs into the lungs every 6 (six) hours as needed for wheezing or shortness of breath. 08/30/15  Yes Epifanio Lesches, MD  amLODipine (NORVASC) 5 MG tablet Take 1 tablet (5 mg total) by mouth daily. 06/19/16  Yes Bettey Costa, MD  carvedilol (COREG) 3.125 MG tablet Take 1 tablet (3.125 mg total) by mouth 2 (two) times daily with a meal. 06/18/16  Yes Sital Mody, MD  cetirizine (ZYRTEC) 10 MG tablet Take 10 mg by mouth daily.   Yes Historical Provider, MD  citalopram (CELEXA) 20 MG tablet Take 20 mg by mouth daily.   Yes Historical Provider, MD  clopidogrel (PLAVIX) 75 MG tablet Take 1 tablet (75 mg total) by mouth daily. 06/19/16  Yes Sital Mody, MD  insulin glargine (LANTUS) 100 UNIT/ML injection Inject 0.2-0.3 mLs (20-30 Units total) into the skin 2 (two) times daily. Take 20 units in morning and  30 units at night 06/18/16 07/16/16 Yes Sital Mody, MD  isosorbide mononitrate (IMDUR) 30 MG 24 hr tablet Take 1 tablet (30 mg total) by mouth daily. 06/18/16  Yes Bettey Costa, MD  levETIRAcetam (KEPPRA) 750 MG tablet Take 1 tablet (750 mg total) by mouth 2 (two) times daily. 06/05/15  Yes Ripudeep Krystal Eaton, MD  losartan (COZAAR) 100 MG tablet Take 1 tablet (100 mg total) by mouth daily. 06/05/15  Yes Ripudeep Krystal Eaton, MD  meloxicam (MOBIC) 7.5 MG tablet Take 1 tablet (7.5 mg total) by mouth daily. 06/05/15  Yes Ripudeep Krystal Eaton, MD  omeprazole (PRILOSEC) 40 MG capsule Take 1 capsule (40 mg total) by mouth 2 (two) times daily. Patient taking differently: Take 40 mg  by mouth daily.  06/05/15  Yes Ripudeep Krystal Eaton, MD  rosuvastatin (CRESTOR) 10 MG tablet Take 1 tablet (10 mg total) by mouth daily. 06/19/16  Yes Bettey Costa, MD  tiotropium (SPIRIVA) 18 MCG inhalation capsule Place 18 mcg into inhaler and inhale daily.   Yes Historical Provider, MD  traMADol (ULTRAM) 50 MG tablet Take 2 tablets (100 mg total) by mouth every 6 (six) hours as needed for moderate pain. 01/08/16  Yes Molli Barrows, MD  traZODone (DESYREL) 50 MG tablet Take 50 mg by mouth at bedtime.   Yes Historical Provider, MD    Review of Systems  Constitutional: Positive for fatigue. Negative for appetite change.  HENT: Negative for congestion, postnasal drip and sore throat.   Eyes: Negative.   Respiratory: Positive for chest tightness (when laying down at night) and shortness of breath. Negative for cough and wheezing.   Cardiovascular: Positive for palpitations and leg swelling. Negative for chest pain.  Gastrointestinal: Negative for abdominal distention and abdominal pain.  Endocrine: Negative.   Genitourinary: Negative.   Musculoskeletal: Positive for arthralgias (left knee pain) and back pain.  Skin: Negative.   Allergic/Immunologic: Negative.   Neurological: Positive for light-headedness. Negative for dizziness.  Hematological: Negative for adenopathy. Does not bruise/bleed easily.  Psychiatric/Behavioral: Positive for dysphoric mood and sleep disturbance (sleeping about 2 hours at a time (normal for her); sleeping on 3 pillows; + snoring; + apneic episodes). Negative for suicidal ideas. The patient is not nervous/anxious.    Vitals:   06/30/16 0924  BP: (!) 169/74  Pulse: (!) 58  Resp: 20  SpO2: 98%  Weight: (!) 305 lb 4 oz (138.5 kg)  Height: 5\' 11"  (1.803 m)   Wt Readings from Last 3 Encounters:  06/30/16 (!) 305 lb 4 oz (138.5 kg)  06/18/16 (!) 300 lb 1.6 oz (136.1 kg)  01/08/16 290 lb (131.5 kg)   Lab Results  Component Value Date   CREATININE 0.84 06/17/2016    CREATININE 0.74 06/16/2016   CREATININE 0.76 08/29/2015   Physical Exam  Constitutional: She is oriented to person, place, and time. She appears well-developed and well-nourished.  HENT:  Head: Normocephalic and atraumatic.  Eyes: Conjunctivae are normal. Pupils are equal, round, and reactive to light.  Neck: Normal range of motion. Neck supple.  Cardiovascular: Regular rhythm.  Bradycardia present.   Pulmonary/Chest: Effort normal. She has no wheezes. She has no rales.  Abdominal: Soft. She exhibits distension. There is no tenderness.  Musculoskeletal: She exhibits edema (1+ pitting edema in bilateral lower legs). She exhibits no tenderness.  Neurological: She is alert and oriented to person, place, and time.  Skin: Skin is warm and dry.  Psychiatric: She has a normal mood and affect. Her behavior is  normal. Thought content normal.  Nursing note and vitals reviewed.   Assessment & Plan:  1: Chronic heart failure with preserved ejection fraction- - NYHA class III - mildly fluid overloaded today; + pedal edema and weight up 5 pounds from discharge - will add furosemide 40mg  once daily along with kdur 75meq once daily - will check labs at her next visit - unsure of how much fluid she is drinking. Advised her to measure her liquids and drink around 40 ounces daily.  - does not have scales so a set of scales was given to her. Instructed her to weigh daily and call for an overnight weight gain of >2 pounds or a weekly weight gain of >5 pounds - has received her flu and pneumonia vaccine for this season - PharmD went in and reviewed medications with the patient - sees her cardiologist Rockey Situ) 07/01/16  2: HTN- - BP mildly elevated today - left knee and back are both hurting her - adding furosemide per above  3: Snoring- - patient endorses snoring and waking herself up gasping - feels tired upon awakening and tired throughout the day - patient is agreeable to a sleep study so a  referral will be made to [facility]  4: Diabetes- - glucose this morning was 194 - once A1C gets <8%, her orthopedic surgeon plans on doing a left knee replacement  Medication bottles were reviewed.  Return here in 1 week for a recheck and for lab work. Call for any questions/problems before then.

## 2016-06-30 NOTE — Patient Instructions (Addendum)
Begin weighing daily and call for an overnight weight gain of > 2 pounds or a weekly weight gain of >5 pounds.   Adding furosemide (lasix) 40mg  daily which is a fluid pill. You will also be taking potassium 53meq once daily as well.   Monitor sodium intake carefully to stay under 2000mg  sodium daily.   Drink about 40 ounces of fluid daily.

## 2016-07-01 ENCOUNTER — Encounter: Payer: Self-pay | Admitting: Cardiovascular Disease

## 2016-07-01 ENCOUNTER — Ambulatory Visit (INDEPENDENT_AMBULATORY_CARE_PROVIDER_SITE_OTHER): Payer: Medicare HMO | Admitting: Cardiovascular Disease

## 2016-07-01 VITALS — BP 152/98 | HR 56 | Ht 71.0 in | Wt 304.8 lb

## 2016-07-01 DIAGNOSIS — R079 Chest pain, unspecified: Secondary | ICD-10-CM

## 2016-07-01 DIAGNOSIS — I10 Essential (primary) hypertension: Secondary | ICD-10-CM

## 2016-07-01 DIAGNOSIS — I2510 Atherosclerotic heart disease of native coronary artery without angina pectoris: Secondary | ICD-10-CM | POA: Diagnosis not present

## 2016-07-01 DIAGNOSIS — I5032 Chronic diastolic (congestive) heart failure: Secondary | ICD-10-CM

## 2016-07-01 MED ORDER — ISOSORBIDE MONONITRATE ER 30 MG PO TB24: 30.0000 mg | ORAL_TABLET | Freq: Two times a day (BID) | ORAL | 6 refills | Status: DC

## 2016-07-01 MED ORDER — NITROGLYCERIN 0.4 MG SL SUBL: 0.4000 mg | SUBLINGUAL_TABLET | SUBLINGUAL | 6 refills | Status: DC | PRN

## 2016-07-01 NOTE — Patient Instructions (Signed)
Medication Instructions:   Please increase the isosorbide up to twice as a day  Labwork:  No new labs needed  Testing/Procedures:  No further testing at this time   I recommend watching educational videos on topics of interest to you at:       [email or web address]  Enter code: [email or web address]    Follow-Up: It was a pleasure seeing you in the office today. Please call us if you have new issues that need to be addressed before your next appt.  [phone]  Your physician wants you to follow-up in: 3 months.  You will receive a reminder letter in the mail two months in advance. If you don't receive a letter, please call our office to schedule the follow-up appointment.  If you need a refill on your cardiac medications before your next appointment, please call your pharmacy.

## 2016-07-01 NOTE — Progress Notes (Signed)
Cardiology Office Note  Date:  07/01/2016   ID:  Tina Hayes, DOB [birth year], MRN [id number]  PCP:  Herminio Commons, MD   Chief Complaint  Patient presents with  . other    [facility] ED for chest pain/CHF. Had myoview/echo. Pt c/o0 chest pain, sob, dizziness-pt doesn't feel comfortable getting on exam table. Reviewed meds with pt verbally.    HPI:  71 y.o. female with h/o CAD s/p remote MI with remote PCI x 5 s/p 3 vessel CABG in 2006 (per patient) in [place], [place] at [facility], COPD 2/2 tobacco abuse, Chronic back pain,  HTN, and seizure disorder Recent hospital admission 06/17/2016 for cough, chest pain, shortness of breath, who presents today to establish care in the [place] office in for follow-up of her chest pain  In the hospital she had stress test showing no ischemia Chest pain was felt to be atypical in nature, pain at rest, reproducible on palpation Pain predominately at nighttime,  Reports that she wakes up gasping for breath  rattle in her chest, cough despite being treated with Z-Pak as an outpatient  She moved to [place] shortly after her CABG and has not seen a cardiologist since.  In the hospital was noted to be hypertensive  In follow-up today she reports having some headaches, typically late in the evening Chest pain at night when she lays down, sometimes wakes her up at night Tender when she pushes on her chest, Was doing lots of coughing before, still some coughing Isosorbide not helping Does not have nitro Reports having some shortness of breath on exertion Has albuterol inhaler Reports that she has significant snoring  Seen yesterday by CHF clinic Sleep study set up by Darylene Price Lasix started  Sugars running high on her current insulin regimen Reports that in [place] she was on 2 types of insulin and sugars were doing better  EKG on today's visit shows normal sinus rhythm with nonspecific ST abnormality in lead V6, 1 and aVL   PMH:    has a past medical history of Anxiety; CHF (congestive heart failure) ([place]); Chronic back pain; COPD (chronic obstructive pulmonary disease) ([place]); Coronary artery disease; Depression; Diabetes mellitus without complication ([place]); H/O heart artery stent (06/17/2016); Heart attack; Hypertension; and Seizures ([place]).  PSH:    Past Surgical History:  Procedure Laterality Date  . ANKLE SURGERY    . CORONARY ARTERY BYPASS GRAFT    . KNEE SURGERY      Current Outpatient Prescriptions  Medication Sig Dispense Refill  . albuterol (PROVENTIL HFA;VENTOLIN HFA) 108 (90 Base) MCG/ACT inhaler Inhale 2 puffs into the lungs every 6 (six) hours as needed for wheezing or shortness of breath. 1 Inhaler 2  . amLODipine (NORVASC) 5 MG tablet Take 1 tablet (5 mg total) by mouth daily. 30 tablet 0  . carvedilol (COREG) 3.125 MG tablet Take 1 tablet (3.125 mg total) by mouth 2 (two) times daily with a meal. 60 tablet 0  . cetirizine (ZYRTEC) 10 MG tablet Take 10 mg by mouth daily.    . citalopram (CELEXA) 20 MG tablet Take 20 mg by mouth daily.    . clopidogrel (PLAVIX) 75 MG tablet Take 1 tablet (75 mg total) by mouth daily. 30 tablet 0  . furosemide (LASIX) 40 MG tablet Take 1 tablet (40 mg total) by mouth daily. 90 tablet 3  . insulin glargine (LANTUS) 100 UNIT/ML injection Inject 0.2-0.3 mLs (20-30 Units total) into the skin 2 (two) times daily. Take 20 units  in morning and 30 units at night 16.8 mL 0  . isosorbide mononitrate (IMDUR) 30 MG 24 hr tablet Take 1 tablet (30 mg total) by mouth 2 (two) times daily. 60 tablet 6  . levETIRAcetam (KEPPRA) 750 MG tablet Take 1 tablet (750 mg total) by mouth 2 (two) times daily. 60 tablet 3  . losartan (COZAAR) 100 MG tablet Take 1 tablet (100 mg total) by mouth daily. 30 tablet 3  . meloxicam (MOBIC) 7.5 MG tablet Take 1 tablet (7.5 mg total) by mouth daily. 30 tablet 1  . omeprazole (PRILOSEC) 40 MG capsule Take 1 capsule (40 mg total) by mouth 2 (two) times daily.  (Patient taking differently: Take 40 mg by mouth daily. ) 60 capsule 3  . potassium chloride SA (K-DUR,KLOR-CON) 20 MEQ tablet Take 1 tablet (20 mEq total) by mouth daily. 90 tablet 3  . rosuvastatin (CRESTOR) 10 MG tablet Take 1 tablet (10 mg total) by mouth daily. 30 tablet 0  . tiotropium (SPIRIVA) 18 MCG inhalation capsule Place 18 mcg into inhaler and inhale daily.    . traMADol (ULTRAM) 50 MG tablet Take 2 tablets (100 mg total) by mouth every 6 (six) hours as needed for moderate pain. 180 tablet 1  . traZODone (DESYREL) 50 MG tablet Take 50 mg by mouth at bedtime.    . nitroGLYCERIN (NITROSTAT) 0.4 MG SL tablet Place 1 tablet (0.4 mg total) under the tongue every 5 (five) minutes as needed for chest pain. 25 tablet 6   No current facility-administered medications for this visit.      Allergies:   Aspirin   Social History:  The patient  reports that she quit smoking about 10 years ago. Her smoking use included Cigarettes. She has a 5.00 pack-year smoking history. She has never used smokeless tobacco. She reports that she does not drink alcohol or use drugs.   Family History:   family history includes Cancer in her sister; Depression in her mother; Diabetes in her brother and mother; Heart attack in her mother and sister; Heart disease in her mother; Heart failure in her mother and sister; Hypertension in her brother, mother, and sister; SIDS in her sister; Stroke in her mother.    Review of Systems: Review of Systems  Constitutional: Positive for malaise/fatigue.  Respiratory: Positive for cough and shortness of breath.   Cardiovascular: Positive for chest pain and leg swelling.  Gastrointestinal: Negative.   Musculoskeletal: Negative.   Neurological: Negative.   Psychiatric/Behavioral: Negative.   All other systems reviewed and are negative.    PHYSICAL EXAM: VS:  BP (!) 152/98 (BP Location: Right Arm, Patient Position: Sitting, Cuff Size: Normal)   Pulse (!) 56   Ht 5\' 11"   (1.803 m)   Wt (!) 304 lb 12 oz (138.2 kg)   BMI 42.50 kg/m  , BMI Body mass index is 42.5 kg/m. GEN: Well nourished, well developed, in no acute distress, obese HEENT: normal  Neck: no JVD, carotid bruits, or masses Cardiac: RRR; no murmurs, rubs, or gallops, trace nonpitting leg edema  Respiratory:  clear to auscultation bilaterally, normal work of breathing GI: soft, nontender, nondistended, + BS MS: no deformity or atrophy  Skin: warm and dry, no rash Neuro:  Strength and sensation are intact Psych: euthymic mood, full affect    Recent Labs: 08/28/2015: ALT 12; B Natriuretic Peptide 31.0 06/16/2016: Magnesium 1.8 06/17/2016: BUN 16; Creatinine, Ser 0.84; Hemoglobin 10.1; Platelets 192; Potassium 3.5; Sodium 142    Lipid Panel Lab  Results  Component Value Date   CHOL 99 06/17/2016   HDL 46 06/17/2016   LDLCALC 40 06/17/2016   TRIG 64 06/17/2016      Wt Readings from Last 3 Encounters:  07/01/16 (!) 304 lb 12 oz (138.2 kg)  06/30/16 (!) 305 lb 4 oz (138.5 kg)  06/18/16 (!) 300 lb 1.6 oz (136.1 kg)       ASSESSMENT AND PLAN:  Essential hypertension - Plan: EKG 12-Lead Blood pressure running high, hydralazine previously held in the hospital possibly secondary to patient not tolerating 3 times a day medication We will increase the isosorbide up to 30 mg twice a day We will avoid increasing amlodipine given mild leg swelling possibly from amlodipine  CAD in native artery - Plan: EKG 12-Lead Recent negative stress test, atypical chest pain at rest on palpation  No further workup at this time. Continue current medication regimen with increased amlodipine as above  Chronic diastolic congestive heart failure ([place]) - Plan: EKG 12-Lead Recent started on Lasix 40 mg daily with potassium by CHF clinic I would agree with this change Recommended she moderate her fluid intake Trace ankle leg swelling possibly from starting amlodipine  Chest pain  Plan: EKG 12-Lead Atypical  in nature, worse on palpation Suspect musculoskeletal disorder, possibly exacerbated by coughing Always happens at nighttime when she lays down in bed, cough is worse No further ischemia workup needed at this time, recent normal stress test  Morbid obesity ([place]) - Plan: EKG 12-Lead Recommended low carbohydrate diet, avoid breads Unable to exercise secondary to chronic back pain  Disposition:   F/U  6 months   Total encounter time more than 25 minutes  Greater than 50% was spent in counseling and coordination of care with the patient    Orders Placed This Encounter  Procedures  . EKG 12-Lead     Signed, Esmond Plants, M.D., Ph.D. 07/01/2016  [place], [place]

## 2016-07-07 ENCOUNTER — Ambulatory Visit: Payer: Medicare Other | Admitting: Family

## 2016-07-29 ENCOUNTER — Telehealth: Payer: Self-pay | Admitting: Anesthesiology

## 2016-07-29 ENCOUNTER — Ambulatory Visit: Payer: Medicare HMO | Attending: Anesthesiology | Admitting: Anesthesiology

## 2016-07-29 ENCOUNTER — Ambulatory Visit: Payer: Medicare HMO | Admitting: Family

## 2016-07-29 ENCOUNTER — Encounter: Payer: Self-pay | Admitting: Anesthesiology

## 2016-07-29 VITALS — BP 163/94 | HR 64 | Temp 97.9°F | Resp 18 | Ht 71.0 in | Wt 310.0 lb

## 2016-07-29 DIAGNOSIS — I2510 Atherosclerotic heart disease of native coronary artery without angina pectoris: Secondary | ICD-10-CM | POA: Insufficient documentation

## 2016-07-29 DIAGNOSIS — M4697 Unspecified inflammatory spondylopathy, lumbosacral region: Secondary | ICD-10-CM

## 2016-07-29 DIAGNOSIS — F329 Major depressive disorder, single episode, unspecified: Secondary | ICD-10-CM | POA: Insufficient documentation

## 2016-07-29 DIAGNOSIS — E119 Type 2 diabetes mellitus without complications: Secondary | ICD-10-CM | POA: Diagnosis not present

## 2016-07-29 DIAGNOSIS — M5441 Lumbago with sciatica, right side: Secondary | ICD-10-CM | POA: Diagnosis not present

## 2016-07-29 DIAGNOSIS — I110 Hypertensive heart disease with heart failure: Secondary | ICD-10-CM | POA: Diagnosis not present

## 2016-07-29 DIAGNOSIS — M5432 Sciatica, left side: Secondary | ICD-10-CM

## 2016-07-29 DIAGNOSIS — I509 Heart failure, unspecified: Secondary | ICD-10-CM | POA: Insufficient documentation

## 2016-07-29 DIAGNOSIS — M5442 Lumbago with sciatica, left side: Secondary | ICD-10-CM | POA: Insufficient documentation

## 2016-07-29 DIAGNOSIS — Z87891 Personal history of nicotine dependence: Secondary | ICD-10-CM | POA: Diagnosis not present

## 2016-07-29 DIAGNOSIS — Z794 Long term (current) use of insulin: Secondary | ICD-10-CM | POA: Diagnosis not present

## 2016-07-29 DIAGNOSIS — J449 Chronic obstructive pulmonary disease, unspecified: Secondary | ICD-10-CM | POA: Insufficient documentation

## 2016-07-29 DIAGNOSIS — F419 Anxiety disorder, unspecified: Secondary | ICD-10-CM | POA: Insufficient documentation

## 2016-07-29 DIAGNOSIS — Z79899 Other long term (current) drug therapy: Secondary | ICD-10-CM | POA: Insufficient documentation

## 2016-07-29 DIAGNOSIS — M4687 Other specified inflammatory spondylopathies, lumbosacral region: Secondary | ICD-10-CM | POA: Diagnosis not present

## 2016-07-29 DIAGNOSIS — I252 Old myocardial infarction: Secondary | ICD-10-CM | POA: Diagnosis not present

## 2016-07-29 DIAGNOSIS — M5136 Other intervertebral disc degeneration, lumbar region: Secondary | ICD-10-CM | POA: Diagnosis present

## 2016-07-29 DIAGNOSIS — M47817 Spondylosis without myelopathy or radiculopathy, lumbosacral region: Secondary | ICD-10-CM

## 2016-07-29 DIAGNOSIS — M5431 Sciatica, right side: Secondary | ICD-10-CM | POA: Diagnosis not present

## 2016-07-29 MED ORDER — TRAMADOL HCL 50 MG PO TABS: 100.0000 mg | ORAL_TABLET | Freq: Four times a day (QID) | ORAL | 2 refills | Status: DC | PRN

## 2016-07-29 NOTE — Telephone Encounter (Signed)
Dr. Andree Elk would like to get procedure appt asap, patient has [facility], please call patient once you have auth for procedure.

## 2016-07-29 NOTE — Progress Notes (Signed)
Safety precautions to be maintained throughout the outpatient stay will include: orient to surroundings, keep bed in low position, maintain call bell within reach at all times, provide assistance with transfer out of bed and ambulation.  

## 2016-07-29 NOTE — Patient Instructions (Signed)
Pain Management Discharge Instructions  General Discharge Instructions :  If you need to reach your doctor call: Monday-Friday 8:00 am - 4:00 pm at [phone] or toll free [phone].  After clinic hours [phone] to have operator reach doctor.  Bring all of your medication bottles to all your appointments in the pain clinic.  To cancel or reschedule your appointment with Pain Management please remember to call 24 hours in advance to avoid a fee.  Refer to the educational materials which you have been given on: General Risks, I had my Procedure. Discharge Instructions, Post Sedation.  Post Procedure Instructions:  The drugs you were given will stay in your system until tomorrow, so for the next 24 hours you should not drive, make any legal decisions or drink any alcoholic beverages.  You may eat anything you prefer, but it is better to start with liquids then soups and crackers, and gradually work up to solid foods.  Please notify your doctor immediately if you have any unusual bleeding, trouble breathing or pain that is not related to your normal pain.  Depending on the type of procedure that was done, some parts of your body may feel week and/or numb.  This usually clears up by tonight or the next day.  Walk with the use of an assistive device or accompanied by an adult for the 24 hours.  You may use ice on the affected area for the first 24 hours.  Put ice in a Ziploc bag and cover with a towel and place against area 15 minutes on 15 minutes off.  You may switch to heat after 24 hours.GENERAL RISKS AND COMPLICATIONS  What are the risk, side effects and possible complications? Generally speaking, most procedures are safe.  However, with any procedure there are risks, side effects, and the possibility of complications.  The risks and complications are dependent upon the sites that are lesioned, or the type of nerve block to be performed.  The closer the procedure is to the spine,  the more serious the risks are.  Great care is taken when placing the radio frequency needles, block needles or lesioning probes, but sometimes complications can occur. 1. Infection: Any time there is an injection through the skin, there is a risk of infection.  This is why sterile conditions are used for these blocks.  There are four possible types of infection. 1. Localized skin infection. 2. Central Nervous System Infection-This can be in the form of Meningitis, which can be deadly. 3. Epidural Infections-This can be in the form of an epidural abscess, which can cause pressure inside of the spine, causing compression of the spinal cord with subsequent paralysis. This would require an emergency surgery to decompress, and there are no guarantees that the patient would recover from the paralysis. 4. Discitis-This is an infection of the intervertebral discs.  It occurs in about 1% of discography procedures.  It is difficult to treat and it may lead to surgery.        2. Pain: the needles have to go through skin and soft tissues, will cause soreness.       3. Damage to internal structures:  The nerves to be lesioned may be near blood vessels or    other nerves which can be potentially damaged.       4. Bleeding: Bleeding is more common if the patient is taking blood thinners such as  aspirin, Coumadin, Ticiid, Plavix, etc., or if he/she have some genetic predisposition  such as   hemophilia. Bleeding into the spinal canal can cause compression of the spinal  cord with subsequent paralysis.  This would require an emergency surgery to  decompress and there are no guarantees that the patient would recover from the  paralysis.       5. Pneumothorax:  Puncturing of a lung is a possibility, every time a needle is introduced in  the area of the chest or upper back.  Pneumothorax refers to free air around the  collapsed lung(s), inside of the thoracic cavity (chest cavity).  Another two possible  complications  related to a similar event would include: Hemothorax and Chylothorax.   These are variations of the Pneumothorax, where instead of air around the collapsed  lung(s), you may have blood or chyle, respectively.       6. Spinal headaches: They may occur with any procedures in the area of the spine.       7. Persistent CSF (Cerebro-Spinal Fluid) leakage: This is a rare problem, but may occur  with prolonged intrathecal or epidural catheters either due to the formation of a fistulous  track or a dural tear.       8. Nerve damage: By working so close to the spinal cord, there is always a possibility of  nerve damage, which could be as serious as a permanent spinal cord injury with  paralysis.       9. Death:  Although rare, severe deadly allergic reactions known as "Anaphylactic  reaction" can occur to any of the medications used.      10. Worsening of the symptoms:  We can always make thing worse.  What are the chances of something like this happening? Chances of any of this occuring are extremely low.  By statistics, you have more of a chance of getting killed in a motor vehicle accident: while driving to the hospital than any of the above occurring .  Nevertheless, you should be aware that they are possibilities.  In general, it is similar to taking a shower.  Everybody knows that you can slip, hit your head and get killed.  Does that mean that you should not shower again?  Nevertheless always keep in mind that statistics do not mean anything if you happen to be on the wrong side of them.  Even if a procedure has a 1 (one) in a 1,000,000 (million) chance of going wrong, it you happen to be that one..Also, keep in mind that by statistics, you have more of a chance of having something go wrong when taking medications.  Who should not have this procedure? If you are on a blood thinning medication (e.g. Coumadin, Plavix, see list of "Blood Thinners"), or if you have an active infection going on, you should not  have the procedure.  If you are taking any blood thinners, please inform your physician.  How should I prepare for this procedure?  Do not eat or drink anything at least six hours prior to the procedure.  Bring a driver with you .  It cannot be a taxi.  Come accompanied by an adult that can drive you back, and that is strong enough to help you if your legs get weak or numb from the local anesthetic.  Take all of your medicines the morning of the procedure with just enough water to swallow them.  If you have diabetes, make sure that you are scheduled to have your procedure done first thing in the morning, whenever possible.  If you have diabetes,   take only half of your insulin dose and notify our nurse that you have done so as soon as you arrive at the clinic.  If you are diabetic, but only take blood sugar pills (oral hypoglycemic), then do not take them on the morning of your procedure.  You may take them after you have had the procedure.  Do not take aspirin or any aspirin-containing medications, at least eleven (11) days prior to the procedure.  They may prolong bleeding.  Wear loose fitting clothing that may be easy to take off and that you would not mind if it got stained with Betadine or blood.  Do not wear any jewelry or perfume  Remove any nail coloring.  It will interfere with some of our monitoring equipment.  NOTE: Remember that this is not meant to be interpreted as a complete list of all possible complications.  Unforeseen problems may occur.  BLOOD THINNERS The following drugs contain aspirin or other products, which can cause increased bleeding during surgery and should not be taken for 2 weeks prior to and 1 week after surgery.  If you should need take something for relief of minor pain, you may take acetaminophen which is found in Tylenol,m Datril, Anacin-3 and Panadol. It is not blood thinner. The products listed below are.  Do not take any of the products listed below  in addition to any listed on your instruction sheet.  A.P.C or A.P.C with Codeine Codeine Phosphate Capsules #3 Ibuprofen Ridaura  ABC compound Congesprin Imuran rimadil  Advil Cope Indocin Robaxisal  Alka-Seltzer Effervescent Pain Reliever and Antacid Coricidin or Coricidin-D  Indomethacin Rufen  Alka-Seltzer plus Cold Medicine Cosprin Ketoprofen S-A-C Tablets  Anacin Analgesic Tablets or Capsules Coumadin Korlgesic Salflex  Anacin Extra Strength Analgesic tablets or capsules CP-2 Tablets Lanoril Salicylate  Anaprox Cuprimine Capsules Levenox Salocol  Anexsia-D Dalteparin Magan Salsalate  Anodynos Darvon compound Magnesium Salicylate Sine-off  Ansaid Dasin Capsules Magsal Sodium Salicylate  Anturane Depen Capsules Marnal Soma  APF Arthritis pain formula Dewitt's Pills Measurin Stanback  Argesic Dia-Gesic Meclofenamic Sulfinpyrazone  Arthritis Bayer Timed Release Aspirin Diclofenac Meclomen Sulindac  Arthritis pain formula Anacin Dicumarol Medipren Supac  Analgesic (Safety coated) Arthralgen Diffunasal Mefanamic Suprofen  Arthritis Strength Bufferin Dihydrocodeine Mepro Compound Suprol  Arthropan liquid Dopirydamole Methcarbomol with Aspirin Synalgos  ASA tablets/Enseals Disalcid Micrainin Tagament  Ascriptin Doan's Midol Talwin  Ascriptin A/D Dolene Mobidin Tanderil  Ascriptin Extra Strength Dolobid Moblgesic Ticlid  Ascriptin with Codeine Doloprin or Doloprin with Codeine Momentum Tolectin  Asperbuf Duoprin Mono-gesic Trendar  Aspergum Duradyne Motrin or Motrin IB Triminicin  Aspirin plain, buffered or enteric coated Durasal Myochrisine Trigesic  Aspirin Suppositories Easprin Nalfon Trillsate  Aspirin with Codeine Ecotrin Regular or Extra Strength Naprosyn Uracel  Atromid-S Efficin Naproxen Ursinus  Auranofin Capsules Elmiron Neocylate Vanquish  Axotal Emagrin Norgesic Verin  Azathioprine Empirin or Empirin with Codeine Normiflo Vitamin E  Azolid Emprazil Nuprin Voltaren  Bayer  Aspirin plain, buffered or children's or timed BC Tablets or powders Encaprin Orgaran Warfarin Sodium  Buff-a-Comp Enoxaparin Orudis Zorpin  Buff-a-Comp with Codeine Equegesic Os-Cal-Gesic   Buffaprin Excedrin plain, buffered or Extra Strength Oxalid   Bufferin Arthritis Strength Feldene Oxphenbutazone   Bufferin plain or Extra Strength Feldene Capsules Oxycodone with Aspirin   Bufferin with Codeine Fenoprofen Fenoprofen Pabalate or Pabalate-SF   Buffets II Flogesic Panagesic   Buffinol plain or Extra Strength Florinal or Florinal with Codeine Panwarfarin   Buf-Tabs Flurbiprofen Penicillamine   Butalbital Compound Four-way cold tablets   Penicillin   Butazolidin Fragmin Pepto-Bismol   Carbenicillin Geminisyn Percodan   Carna Arthritis Reliever Geopen Persantine   Carprofen Gold's salt Persistin   Chloramphenicol Goody's Phenylbutazone   Chloromycetin Haltrain Piroxlcam   Clmetidine heparin Plaquenil   Cllnoril Hyco-pap Ponstel   Clofibrate Hydroxy chloroquine Propoxyphen         Before stopping any of these medications, be sure to consult the physician who ordered them.  Some, such as Coumadin (Warfarin) are ordered to prevent or treat serious conditions such as "deep thrombosis", "pumonary embolisms", and other heart problems.  The amount of time that you may need off of the medication may also vary with the medication and the reason for which you were taking it.  If you are taking any of these medications, please make sure you notify your pain physician before you undergo any procedures.         Epidural Steroid Injection Patient Information  Description: The epidural space surrounds the nerves as they exit the spinal cord.  In some patients, the nerves can be compressed and inflamed by a bulging disc or a tight spinal canal (spinal stenosis).  By injecting steroids into the epidural space, we can bring irritated nerves into direct contact with a potentially helpful medication.   These steroids act directly on the irritated nerves and can reduce swelling and inflammation which often leads to decreased pain.  Epidural steroids may be injected anywhere along the spine and from the neck to the low back depending upon the location of your pain.   After numbing the skin with local anesthetic (like Novocaine), a small needle is passed into the epidural space slowly.  You may experience a sensation of pressure while this is being done.  The entire block usually last less than 10 minutes.  Conditions which may be treated by epidural steroids:   Low back and leg pain  Neck and arm pain  Spinal stenosis  Post-laminectomy syndrome  Herpes zoster (shingles) pain  Pain from compression fractures  Preparation for the injection:  1. Do not eat any solid food or dairy products within 8 hours of your appointment.  2. You may drink clear liquids up to 3 hours before appointment.  Clear liquids include water, black coffee, juice or soda.  No milk or cream please. 3. You may take your regular medication, including pain medications, with a sip of water before your appointment  Diabetics should hold regular insulin (if taken separately) and take 1/2 normal NPH dos the morning of the procedure.  Carry some sugar containing items with you to your appointment. 4. A driver must accompany you and be prepared to drive you home after your procedure.  5. Bring all your current medications with your. 6. An IV may be inserted and sedation may be given at the discretion of the physician.   7. A blood pressure cuff, EKG and other monitors will often be applied during the procedure.  Some patients may need to have extra oxygen administered for a short period. 8. You will be asked to provide medical information, including your allergies, prior to the procedure.  We must know immediately if you are taking blood thinners (like Coumadin/Warfarin)  Or if you are allergic to IV iodine contrast (dye). We  must know if you could possible be pregnant.  Possible side-effects:  Bleeding from needle site  Infection (rare, may require surgery)  Nerve injury (rare)  Numbness & tingling (temporary)  Difficulty urinating (rare, temporary)  Spinal headache (   a headache worse with upright posture)  Light -headedness (temporary)  Pain at injection site (several days)  Decreased blood pressure (temporary)  Weakness in arm/leg (temporary)  Pressure sensation in back/neck (temporary)  Call if you experience:  Fever/chills associated with headache or increased back/neck pain.  Headache worsened by an upright position.  New onset weakness or numbness of an extremity below the injection site  Hives or difficulty breathing (go to the emergency room)  Inflammation or drainage at the infection site  Severe back/neck pain  Any new symptoms which are concerning to you  Please note:  Although the local anesthetic injected can often make your back or neck feel good for several hours after the injection, the pain will likely return.  It takes 3-7 days for steroids to work in the epidural space.  You may not notice any pain relief for at least that one week.  If effective, we will often do a series of three injections spaced 3-6 weeks apart to maximally decrease your pain.  After the initial series, we generally will wait several months before considering a repeat injection of the same type.  If you have any questions, please call [phone] [facility] 

## 2016-07-29 NOTE — Progress Notes (Signed)
Nursing Pain Medication Assessment:  Safety precautions to be maintained throughout the outpatient stay will include: orient to surroundings, keep bed in low position, maintain call bell within reach at all times, provide assistance with transfer out of bed and ambulation.  Medication Inspection Compliance: Pill count conducted under aseptic conditions, in front of the patient. Neither the pills nor the bottle was removed from the patient's sight at any time. Once count was completed pills were immediately returned to the patient in their original bottle.  Medication: Tramadol (Ultram) Pill/Patch Count: 8 of 180 pills remain Pill/Patch Appearance: Markings consistent with prescribed medication Bottle Appearance: Standard pharmacy container. Clearly labeled. Filled Date: 02 / 16 / 2018 Last Medication intake:

## 2016-07-30 ENCOUNTER — Ambulatory Visit: Payer: Medicare HMO | Admitting: Family

## 2016-07-30 ENCOUNTER — Telehealth: Payer: Self-pay | Admitting: Family

## 2016-07-30 NOTE — Telephone Encounter (Signed)
Patient did not show for her Heart Failure Clinic appointment on 07/30/16. Will attempt to reschedule.  
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
## 2016-08-04 NOTE — Telephone Encounter (Signed)
I received authorization from her insurance for her to have the epidural and have called and left her a voicemail for her to call me to schedule.  She has not responded yet.

## 2016-08-06 ENCOUNTER — Telehealth: Payer: Self-pay | Admitting: Family

## 2016-08-06 NOTE — Telephone Encounter (Signed)
Larene Beach (medical student) called to say that they saw patient this morning and her initial blood pressure was 200/78 and then recheck was 171/79. Weight up just a couple of pounds from a previous visit. Minimal edema in bilateral lower legs.  She was questioning whether to increase her amlodipine or her furosemide.  Discussed that increasing the amlodipine could potentially increase her pedal edema. Recommended that they increase her furosemide to 40mg  twice daily along with increasing her potassium to 30meq twice daily. She will call patient with medication changes.   Also informed them that patient missed her last appointment with [place]. Will call patient to see about getting her rescheduled to next week so that her blood work can be checked.

## 2016-08-12 ENCOUNTER — Ambulatory Visit: Payer: Medicare HMO | Attending: Family | Admitting: Family

## 2016-08-12 ENCOUNTER — Encounter: Payer: Self-pay | Admitting: Family

## 2016-08-12 VITALS — BP 135/54 | HR 58 | Resp 20 | Ht 71.0 in | Wt 317.0 lb

## 2016-08-12 DIAGNOSIS — I110 Hypertensive heart disease with heart failure: Secondary | ICD-10-CM | POA: Insufficient documentation

## 2016-08-12 DIAGNOSIS — I252 Old myocardial infarction: Secondary | ICD-10-CM | POA: Insufficient documentation

## 2016-08-12 DIAGNOSIS — I5032 Chronic diastolic (congestive) heart failure: Secondary | ICD-10-CM | POA: Diagnosis not present

## 2016-08-12 DIAGNOSIS — Z87891 Personal history of nicotine dependence: Secondary | ICD-10-CM | POA: Diagnosis not present

## 2016-08-12 DIAGNOSIS — I10 Essential (primary) hypertension: Secondary | ICD-10-CM

## 2016-08-12 DIAGNOSIS — E119 Type 2 diabetes mellitus without complications: Secondary | ICD-10-CM | POA: Diagnosis not present

## 2016-08-12 DIAGNOSIS — G8929 Other chronic pain: Secondary | ICD-10-CM | POA: Diagnosis not present

## 2016-08-12 DIAGNOSIS — Z955 Presence of coronary angioplasty implant and graft: Secondary | ICD-10-CM | POA: Insufficient documentation

## 2016-08-12 DIAGNOSIS — F329 Major depressive disorder, single episode, unspecified: Secondary | ICD-10-CM | POA: Diagnosis not present

## 2016-08-12 DIAGNOSIS — I2510 Atherosclerotic heart disease of native coronary artery without angina pectoris: Secondary | ICD-10-CM | POA: Diagnosis not present

## 2016-08-12 DIAGNOSIS — Z794 Long term (current) use of insulin: Secondary | ICD-10-CM | POA: Insufficient documentation

## 2016-08-12 DIAGNOSIS — E669 Obesity, unspecified: Secondary | ICD-10-CM | POA: Diagnosis not present

## 2016-08-12 DIAGNOSIS — M549 Dorsalgia, unspecified: Secondary | ICD-10-CM | POA: Insufficient documentation

## 2016-08-12 DIAGNOSIS — J449 Chronic obstructive pulmonary disease, unspecified: Secondary | ICD-10-CM | POA: Insufficient documentation

## 2016-08-12 DIAGNOSIS — R0683 Snoring: Secondary | ICD-10-CM

## 2016-08-12 DIAGNOSIS — F419 Anxiety disorder, unspecified: Secondary | ICD-10-CM | POA: Insufficient documentation

## 2016-08-12 LAB — BASIC METABOLIC PANEL
ANION GAP: 8 (ref 5–15)
BUN: 28 mg/dL — ABNORMAL HIGH (ref 6–20)
CALCIUM: 9.2 mg/dL (ref 8.9–10.3)
CO2: 29 mmol/L (ref 22–32)
Chloride: 108 mmol/L (ref 101–111)
Creatinine, Ser: 0.93 mg/dL (ref 0.44–1.00)
GFR calc Af Amer: >60 mL/min (ref >60)
Glucose, Bld: 53 mg/dL — ABNORMAL LOW (ref 65–99)
Potassium: 3.8 mmol/L (ref 3.5–5.1)
Sodium: 145 mmol/L (ref 135–145)

## 2016-08-12 MED ORDER — CLOPIDOGREL BISULFATE 75 MG PO TABS: 75.0000 mg | ORAL_TABLET | Freq: Every day | ORAL | 3 refills | Status: DC

## 2016-08-12 MED ORDER — ISOSORBIDE MONONITRATE ER 30 MG PO TB24: 30.0000 mg | ORAL_TABLET | Freq: Two times a day (BID) | ORAL | 3 refills | Status: DC

## 2016-08-12 MED ORDER — AMLODIPINE BESYLATE 5 MG PO TABS: 5.0000 mg | ORAL_TABLET | Freq: Every day | ORAL | 3 refills | Status: DC

## 2016-08-12 MED ORDER — LOSARTAN POTASSIUM 100 MG PO TABS: 100.0000 mg | ORAL_TABLET | Freq: Every day | ORAL | 3 refills | Status: DC

## 2016-08-12 MED ORDER — FUROSEMIDE 40 MG PO TABS: 40.0000 mg | ORAL_TABLET | Freq: Two times a day (BID) | ORAL | 3 refills | Status: DC

## 2016-08-12 MED ORDER — POTASSIUM CHLORIDE CRYS ER 20 MEQ PO TBCR: 20.0000 meq | EXTENDED_RELEASE_TABLET | Freq: Every day | ORAL | 3 refills | Status: DC

## 2016-08-12 MED ORDER — CARVEDILOL 3.125 MG PO TABS: 3.1250 mg | ORAL_TABLET | Freq: Two times a day (BID) | ORAL | 3 refills | Status: DC

## 2016-08-12 NOTE — Patient Instructions (Signed)
Continue weighing daily and call for an overnight weight gain of > 2 pounds or a weekly weight gain of >5 pounds. 

## 2016-08-12 NOTE — Progress Notes (Signed)
Patient ID: Tina Hayes, female    DOB: 27-Mar-1946, 71 y.o.   MRN: [id number]  HPI  Tina Hayes is a 71 y/o female with a history of seizures, HTN, cardiac stents, DM, depression, CAD, COPD, chronic back pain, anxiety, obesity, remote tobacco use and chronic heart failure.   Last echo was done 06/17/16 and showed an EF of 60-65% along with mild MR and moderate TR. Pulmonary systolic pressure was mildly to moderately elevated. Stress test done 06/18/16.  Admitted 06/16/16 with chest pain. Had negative troponins. Cardiology consult done and patient underwent stress test which was read as low risk for ischemia. Medications were adjusted and she was discharged home after 2 days. Admitted 08/28/15 due to COPD exacerbation. Received IV steroids, nebulizers and antibiotics. She was discharged home after 2 days.   She presents today for her follow-up visit with a chief complaint of moderate shortness of breath with minimal exertion. It is relieved by rest and has been present for the last few months. She has associated fatigue, intermittent chest pain and edema. Breathing has improved since she started taking furosemide twice daily.   Past Medical History:  Diagnosis Date  . Anxiety   . CHF (congestive heart failure) ([place])   . Chronic back pain   . COPD (chronic obstructive pulmonary disease) ([place])   . Coronary artery disease   . Depression   . Diabetes mellitus without complication ([place])   . H/O heart artery stent 06/17/2016   5 stents per patient  . Heart attack ([place])    Total of 3 per pt.  . Hypertension   . Seizures ([place])    Past Surgical History:  Procedure Laterality Date  . ANKLE SURGERY    . CORONARY ARTERY BYPASS GRAFT    . KNEE SURGERY     Family History  Problem Relation Age of Onset  . Diabetes    . Hypertension    . Diabetes Mother   . Heart failure Mother   . Heart disease Mother   . Heart attack Mother   . Stroke Mother   . Depression Mother   . Hypertension Mother   .  Cancer Sister     brain  . Hypertension Sister   . Diabetes Brother   . Hypertension Brother   . Heart failure Sister   . Heart attack Sister   . SIDS Sister    Social History  Substance Use Topics  . Smoking status: Former Smoker    Packs/day: 0.25    Years: 20.00    Types: Cigarettes    Quit date: 07/01/2006  . Smokeless tobacco: Never Used  . Alcohol use No   Allergies  Allergen Reactions  . Aspirin Anaphylaxis   Prior to Admission medications   Medication Sig Start Date End Date Taking? Authorizing Provider  ACCU-CHEK AVIVA PLUS test strip  05/19/16  Yes Historical Provider, MD  albuterol (PROVENTIL HFA;VENTOLIN HFA) 108 (90 Base) MCG/ACT inhaler Inhale 2 puffs into the lungs every 6 (six) hours as needed for wheezing or shortness of breath. 08/30/15  Yes Epifanio Lesches, MD  amLODipine (NORVASC) 5 MG tablet Take 1 tablet (5 mg total) by mouth daily. 08/12/16  Yes Alisa Graff, FNP  atorvastatin (LIPITOR) 40 MG tablet Take 40 mg by mouth daily at 6 PM.  07/10/16  Yes Historical Provider, MD  Blood Glucose Monitoring Suppl (ACCU-CHEK AVIVA PLUS) w/Device KIT  06/13/16  Yes Historical Provider, MD  carvedilol (COREG) 3.125 MG tablet Take 1 tablet (3.125  mg total) by mouth 2 (two) times daily with a meal. 08/12/16  Yes Alisa Graff, FNP  cetirizine (ZYRTEC) 10 MG tablet Take 10 mg by mouth daily.   Yes Historical Provider, MD  citalopram (CELEXA) 20 MG tablet Take 20 mg by mouth daily.   Yes Historical Provider, MD  cyclobenzaprine (FLEXERIL) 5 MG tablet Take 5 mg by mouth daily.  07/10/16  Yes Historical Provider, MD  furosemide (LASIX) 40 MG tablet Take 1 tablet (40 mg total) by mouth 2 (two) times daily. 08/12/16  Yes Alisa Graff, FNP  hydrALAZINE (APRESOLINE) 25 MG tablet Take 25 mg by mouth 3 (three) times daily.  07/10/16  Yes Historical Provider, MD  insulin aspart (NOVOLOG) 100 UNIT/ML injection Inject 5 Units into the skin daily. 10 units in the morning 5 units at lunch    Yes Historical Provider, MD  insulin glargine (LANTUS) 100 UNIT/ML injection Inject 0.2-0.3 mLs (20-30 Units total) into the skin 2 (two) times daily. Take 20 units in morning and 30 units at night Patient taking differently: Inject 20-30 Units into the skin 2 (two) times daily. 40 units at bedtime 06/18/16 08/12/16 Yes Sital Mody, MD  isosorbide mononitrate (IMDUR) 30 MG 24 hr tablet Take 1 tablet (30 mg total) by mouth 2 (two) times daily. 08/12/16  Yes Alisa Graff, FNP  levETIRAcetam (KEPPRA) 750 MG tablet Take 1 tablet (750 mg total) by mouth 2 (two) times daily. 06/05/15  Yes Ripudeep Krystal Eaton, MD  losartan (COZAAR) 100 MG tablet Take 1 tablet (100 mg total) by mouth daily. 08/12/16  Yes Alisa Graff, FNP  meloxicam (MOBIC) 7.5 MG tablet Take 1 tablet (7.5 mg total) by mouth daily. 06/05/15  Yes Ripudeep Krystal Eaton, MD  nitroGLYCERIN (NITROSTAT) 0.4 MG SL tablet Place 1 tablet (0.4 mg total) under the tongue every 5 (five) minutes as needed for chest pain. 07/01/16 07/01/17 Yes Minna Merritts, MD  omeprazole (PRILOSEC) 40 MG capsule Take 1 capsule (40 mg total) by mouth 2 (two) times daily. Patient taking differently: Take 40 mg by mouth daily.  06/05/15  Yes Ripudeep Krystal Eaton, MD  potassium chloride SA (K-DUR,KLOR-CON) 20 MEQ tablet Take 1 tablet (20 mEq total) by mouth daily. 08/12/16  Yes Alisa Graff, FNP  tiotropium (SPIRIVA) 18 MCG inhalation capsule Place 18 mcg into inhaler and inhale daily.   Yes Historical Provider, MD  traMADol (ULTRAM) 50 MG tablet Take 2 tablets (100 mg total) by mouth every 6 (six) hours as needed for moderate pain. 07/29/16  Yes Molli Barrows, MD  traZODone (DESYREL) 50 MG tablet Take 50 mg by mouth at bedtime.   Yes Historical Provider, MD  UNIFINE PENTIPS 31G X 6 MM MISC  07/23/16  Yes Historical Provider, MD  clopidogrel (PLAVIX) 75 MG tablet Take 1 tablet (75 mg total) by mouth daily. 08/12/16   Alisa Graff, FNP    Review of Systems  Constitutional: Positive for fatigue  (tire easy). Negative for appetite change.  HENT: Positive for congestion. Negative for postnasal drip and sore throat.   Eyes: Negative.   Respiratory: Positive for shortness of breath. Negative for cough and chest tightness.   Cardiovascular: Positive for chest pain (at times; relieved on its own) and leg swelling. Negative for palpitations.  Gastrointestinal: Positive for abdominal distention. Negative for abdominal pain.  Endocrine: Negative.   Genitourinary: Negative.   Musculoskeletal: Positive for back pain. Negative for neck pain.  Skin: Negative.   Allergic/Immunologic: Negative.  Neurological: Positive for light-headedness. Negative for dizziness.  Hematological: Negative for adenopathy. Does not bruise/bleed easily.  Psychiatric/Behavioral: Positive for sleep disturbance (+ snoring). Negative for dysphoric mood and suicidal ideas. The patient is not nervous/anxious.    Vitals:   08/12/16 1105 08/12/16 1107  BP:  (!) 135/54  Pulse: (!) 58   Resp: 20   SpO2: 100%   Weight: (!) 317 lb (143.8 kg)   Height: _0  (1.803 m)    Wt Readings from Last 3 Encounters:  08/12/16 (!) 317 lb (143.8 kg)  07/29/16 (!) 310 lb (140.6 kg)  07/01/16 (!) 304 lb 12 oz (138.2 kg)    Physical Exam  Constitutional: She is oriented to person, place, and time. She appears well-developed and well-nourished.  HENT:  Head: Normocephalic and atraumatic.  Neck: Normal range of motion. Neck supple. No JVD present.  Cardiovascular: Regular rhythm.  Bradycardia present.   Pulmonary/Chest: Effort normal. She has no wheezes. She has no rales.  Abdominal: Soft. She exhibits no distension. There is no tenderness.  Musculoskeletal: She exhibits no edema or tenderness.  Neurological: She is alert and oriented to person, place, and time.  Skin: Skin is warm and dry.  Psychiatric: She has a normal mood and affect. Her behavior is normal.  Nursing note and vitals reviewed.   Assessment & Plan:  1:  Chronic heart failure with preserved ejection fraction- - NYHA class III - mildly fluid overloaded today; + pedal edema and weight up 12 pounds from last time she was here - taking furosemide 69m twice daily (since 08/06/16) and potasium 22m daily. Feels like her edema is better since taking furosemide twice daily - will check a BMP today - weighing daily and says that her home weight ranges from 301-307 pounds. Reminded to call for an overnight weight gain of >2 pounds or a weekly weight gain of >5 pounds - saw her cardiologist (GRockey Situ3/6/18; returns 10/02/16 - discussed [facility] but patient isn't interested at this time  2: HTN- - BP looks good today - lasix increased 08/06/16 - BMP from 06/17/16 reviewed; potassium 3.5 and GFR >60  3: Snoring- - patient endorses snoring and waking herself up gasping - feels tired upon awakening and tired throughout the day - sleep study scheduled for 08/14/16  4: Diabetes- - glucose this morning was 134 - A1c on 06/17/16 was 8.5% - once A1C gets <8%, her orthopedic surgeon plans on doing a left knee replacement  Medication bottles were reviewed.   Returns in 1 week for a recheck of her blood pressure.

## 2016-08-14 ENCOUNTER — Encounter: Payer: Self-pay | Admitting: *Deleted

## 2016-08-14 ENCOUNTER — Emergency Department
Admission: EM | Admit: 2016-08-14 | Discharge: 2016-08-14 | Disposition: A | Discharge status: Home / Self Care | Payer: Medicare HMO | Attending: Emergency Medicine | Admitting: Emergency Medicine

## 2016-08-14 ENCOUNTER — Other Ambulatory Visit: Payer: Self-pay

## 2016-08-14 DIAGNOSIS — J449 Chronic obstructive pulmonary disease, unspecified: Secondary | ICD-10-CM | POA: Insufficient documentation

## 2016-08-14 DIAGNOSIS — Z794 Long term (current) use of insulin: Secondary | ICD-10-CM | POA: Insufficient documentation

## 2016-08-14 DIAGNOSIS — Z87891 Personal history of nicotine dependence: Secondary | ICD-10-CM | POA: Insufficient documentation

## 2016-08-14 DIAGNOSIS — Z79899 Other long term (current) drug therapy: Secondary | ICD-10-CM | POA: Insufficient documentation

## 2016-08-14 DIAGNOSIS — R197 Diarrhea, unspecified: Secondary | ICD-10-CM | POA: Insufficient documentation

## 2016-08-14 DIAGNOSIS — I2510 Atherosclerotic heart disease of native coronary artery without angina pectoris: Secondary | ICD-10-CM | POA: Diagnosis not present

## 2016-08-14 DIAGNOSIS — E119 Type 2 diabetes mellitus without complications: Secondary | ICD-10-CM | POA: Diagnosis not present

## 2016-08-14 DIAGNOSIS — R1030 Lower abdominal pain, unspecified: Secondary | ICD-10-CM | POA: Diagnosis not present

## 2016-08-14 DIAGNOSIS — I509 Heart failure, unspecified: Secondary | ICD-10-CM | POA: Diagnosis not present

## 2016-08-14 DIAGNOSIS — R112 Nausea with vomiting, unspecified: Secondary | ICD-10-CM | POA: Diagnosis not present

## 2016-08-14 DIAGNOSIS — I110 Hypertensive heart disease with heart failure: Secondary | ICD-10-CM | POA: Diagnosis not present

## 2016-08-14 LAB — COMPREHENSIVE METABOLIC PANEL
ALK PHOS: 138 U/L — AB (ref 38–126)
ALT: 15 U/L (ref 14–54)
AST: 18 U/L (ref 15–41)
Albumin: 3.8 g/dL (ref 3.5–5.0)
Anion gap: 7 (ref 5–15)
BUN: 18 mg/dL (ref 6–20)
CALCIUM: 9.2 mg/dL (ref 8.9–10.3)
CO2: 26 mmol/L (ref 22–32)
CREATININE: 0.69 mg/dL (ref 0.44–1.00)
Chloride: 107 mmol/L (ref 101–111)
GFR calc non Af Amer: >60 mL/min (ref >60)
GLUCOSE: 106 mg/dL — AB (ref 65–99)
Potassium: 4.3 mmol/L (ref 3.5–5.1)
SODIUM: 140 mmol/L (ref 135–145)
Total Bilirubin: 0.6 mg/dL (ref 0.3–1.2)
Total Protein: 7.4 g/dL (ref 6.5–8.1)

## 2016-08-14 LAB — CBC
HCT: 36.9 % (ref 35.0–47.0)
Hemoglobin: 11.8 g/dL — ABNORMAL LOW (ref 12.0–16.0)
MCH: 26.7 pg (ref 26.0–34.0)
MCHC: 32.1 g/dL (ref 32.0–36.0)
MCV: 83.4 fL (ref 80.0–100.0)
PLATELETS: 219 10*3/uL (ref 150–440)
RBC: 4.43 MIL/uL (ref 3.80–5.20)
RDW: 14.9 % — ABNORMAL HIGH (ref 11.5–14.5)
WBC: 6.3 10*3/uL (ref 3.6–11.0)

## 2016-08-14 LAB — LIPASE, BLOOD: Lipase: 15 U/L (ref 11–51)

## 2016-08-14 MED ORDER — SODIUM CHLORIDE 0.9 % IV BOLUS (SEPSIS)
500.0000 mL | Freq: Once | INTRAVENOUS | Status: AC
  Administered 2016-08-14: 500 mL via INTRAVENOUS

## 2016-08-14 MED ORDER — PROMETHAZINE HCL 25 MG/ML IJ SOLN
12.5000 mg | Freq: Once | INTRAMUSCULAR | Status: AC
  Administered 2016-08-14: 12.5 mg via INTRAMUSCULAR
  Filled 2016-08-14: qty 1

## 2016-08-14 MED ORDER — CLOPIDOGREL BISULFATE 75 MG PO TABS: 75.0000 mg | ORAL_TABLET | Freq: Every day | ORAL | 0 refills | Status: DC

## 2016-08-14 MED ORDER — AMLODIPINE BESYLATE 5 MG PO TABS
5.0000 mg | ORAL_TABLET | Freq: Every day | ORAL | 0 refills | Status: DC
Start: 2016-08-14 — End: 2017-02-02

## 2016-08-14 MED ORDER — PROMETHAZINE HCL 12.5 MG PO TABS: 12.5000 mg | ORAL_TABLET | Freq: Four times a day (QID) | ORAL | 0 refills | Status: DC | PRN

## 2016-08-14 NOTE — ED Notes (Signed)
Pt discharged to home.  Family member driving.  Discharge instructions reviewed.  Verbalized understanding.  No questions or concerns at this time.  Teach back verified.  Pt in NAD.  No items left in ED.   

## 2016-08-14 NOTE — ED Triage Notes (Signed)
States vomiting and diarrhea that began this AM, EMS gave 4mg  IM zofran and pt states relief, states lower abd pain, awake and alert in no acute distress

## 2016-08-14 NOTE — Telephone Encounter (Signed)
Requested Prescriptions   Signed Prescriptions Disp Refills  . amLODipine (NORVASC) 5 MG tablet 30 tablet 0    Sig: Take 1 tablet (5 mg total) by mouth daily.    Authorizing Provider: Minna Merritts    Ordering User: [name] clopidogrel (PLAVIX) 75 MG tablet 30 tablet 0    Sig: Take 1 tablet (75 mg total) by mouth daily.    Authorizing Provider: Minna Merritts    Ordering User: [name]

## 2016-08-14 NOTE — ED Notes (Signed)
Spoke with Dr. Burlene Arnt regarding patient's blood pressure, EDP states for patient to take home BP medications.  Pt notified and verbalized understanding.

## 2016-08-14 NOTE — ED Provider Notes (Addendum)
[facility] Emergency Department Provider Note  ____________________________________________   I have reviewed the triage vital signs and the nursing notes.   HISTORY  Chief Complaint Emesis    HPI Tina Hayes is a 71 y.o. female is associated nausea vomiting and diarrhea and some lower abdominal cramping. Patient has had symptoms since this morning. Denies any fever or chills. No hematemesis no melena bright red blood per rectum. Does have sick contacts, her son had the same thing last week. She denies any recent travel or antibiotics. She denies any camping. Patient has had copious diarrhea copious vomiting today.     Past Medical History:  Diagnosis Date  . Anxiety   . CHF (congestive heart failure) ([place])   . Chronic back pain   . COPD (chronic obstructive pulmonary disease) ([place])   . Coronary artery disease   . Depression   . Diabetes mellitus without complication ([place])   . H/O heart artery stent 06/17/2016   5 stents per patient  . Heart attack ([place])    Total of 3 per pt.  . Hypertension   . Seizures [facility])     Patient Active Problem List   Diagnosis Date Noted  . HTN (hypertension) 06/30/2016  . Snoring 06/30/2016  . Diabetes ([employer]) 06/30/2016  . CHF (congestive heart failure) ([place]) 06/18/2016  . CAD in native artery 06/17/2016  . Morbid obesity ([place]) 06/17/2016  . Chest pain with moderate risk for cardiac etiology 06/16/2016  . COPD (chronic obstructive pulmonary disease) ([place]) 08/28/2015  . Hypertensive urgency 06/03/2015    Past Surgical History:  Procedure Laterality Date  . ANKLE SURGERY    . CORONARY ARTERY BYPASS GRAFT    . KNEE SURGERY      Prior to Admission medications   Medication Sig Start Date End Date Taking? Authorizing Provider  ACCU-CHEK AVIVA PLUS test strip  05/19/16   Historical Provider, MD  albuterol (PROVENTIL HFA;VENTOLIN HFA) 108 (90 Base) MCG/ACT inhaler Inhale 2 puffs into the lungs every 6 (six) hours  as needed for wheezing or shortness of breath. 08/30/15   Epifanio Lesches, MD  amLODipine (NORVASC) 5 MG tablet Take 1 tablet (5 mg total) by mouth daily. 08/14/16   Minna Merritts, MD  atorvastatin (LIPITOR) 40 MG tablet Take 40 mg by mouth daily at 6 PM.  07/10/16   Historical Provider, MD  Blood Glucose Monitoring Suppl (ACCU-CHEK AVIVA PLUS) w/Device KIT  06/13/16   Historical Provider, MD  carvedilol (COREG) 3.125 MG tablet Take 1 tablet (3.125 mg total) by mouth 2 (two) times daily with a meal. 08/12/16   Alisa Graff, FNP  cetirizine (ZYRTEC) 10 MG tablet Take 10 mg by mouth daily.    Historical Provider, MD  citalopram (CELEXA) 20 MG tablet Take 20 mg by mouth daily.    Historical Provider, MD  clopidogrel (PLAVIX) 75 MG tablet Take 1 tablet (75 mg total) by mouth daily. 08/14/16   Minna Merritts, MD  cyclobenzaprine (FLEXERIL) 5 MG tablet Take 5 mg by mouth daily.  07/10/16   Historical Provider, MD  furosemide (LASIX) 40 MG tablet Take 1 tablet (40 mg total) by mouth 2 (two) times daily. 08/12/16   Alisa Graff, FNP  hydrALAZINE (APRESOLINE) 25 MG tablet Take 25 mg by mouth 3 (three) times daily.  07/10/16   Historical Provider, MD  insulin aspart (NOVOLOG) 100 UNIT/ML injection Inject 5 Units into the skin daily. 10 units in the morning 5 units at lunch  Historical Provider, MD  insulin glargine (LANTUS) 100 UNIT/ML injection Inject 0.2-0.3 mLs (20-30 Units total) into the skin 2 (two) times daily. Take 20 units in morning and 30 units at night Patient taking differently: Inject 20-30 Units into the skin 2 (two) times daily. 40 units at bedtime 06/18/16 08/12/16  Bettey Costa, MD  isosorbide mononitrate (IMDUR) 30 MG 24 hr tablet Take 1 tablet (30 mg total) by mouth 2 (two) times daily. 08/12/16   Alisa Graff, FNP  levETIRAcetam (KEPPRA) 750 MG tablet Take 1 tablet (750 mg total) by mouth 2 (two) times daily. 06/05/15   Ripudeep Krystal Eaton, MD  losartan (COZAAR) 100 MG tablet Take 1 tablet (100  mg total) by mouth daily. 08/12/16   Alisa Graff, FNP  meloxicam (MOBIC) 7.5 MG tablet Take 1 tablet (7.5 mg total) by mouth daily. 06/05/15   Ripudeep Krystal Eaton, MD  nitroGLYCERIN (NITROSTAT) 0.4 MG SL tablet Place 1 tablet (0.4 mg total) under the tongue every 5 (five) minutes as needed for chest pain. 07/01/16 07/01/17  Minna Merritts, MD  omeprazole (PRILOSEC) 40 MG capsule Take 1 capsule (40 mg total) by mouth 2 (two) times daily. Patient taking differently: Take 40 mg by mouth daily.  06/05/15   Ripudeep Krystal Eaton, MD  potassium chloride SA (K-DUR,KLOR-CON) 20 MEQ tablet Take 1 tablet (20 mEq total) by mouth daily. 08/12/16   Alisa Graff, FNP  tiotropium (SPIRIVA) 18 MCG inhalation capsule Place 18 mcg into inhaler and inhale daily.    Historical Provider, MD  traMADol (ULTRAM) 50 MG tablet Take 2 tablets (100 mg total) by mouth every 6 (six) hours as needed for moderate pain. 07/29/16   Molli Barrows, MD  traZODone (DESYREL) 50 MG tablet Take 50 mg by mouth at bedtime.    Historical Provider, MD  UNIFINE PENTIPS 31G X 6 MM [place]  07/23/16   Historical Provider, MD    Allergies Aspirin  Family History  Problem Relation Age of Onset  . Diabetes    . Hypertension    . Diabetes Mother   . Heart failure Mother   . Heart disease Mother   . Heart attack Mother   . Stroke Mother   . Depression Mother   . Hypertension Mother   . Cancer Sister     brain  . Hypertension Sister   . Diabetes Brother   . Hypertension Brother   . Heart failure Sister   . Heart attack Sister   . SIDS Sister     Social History Social History  Substance Use Topics  . Smoking status: Former Smoker    Packs/day: 0.25    Years: 20.00    Types: Cigarettes    Quit date: 07/01/2006  . Smokeless tobacco: Never Used  . Alcohol use No    Review of Systems Constitutional: No fever/chills Eyes: No visual changes. ENT: No sore throat. No stiff neck no neck pain Cardiovascular: Denies chest pain. Respiratory: Denies  shortness of breath. Gastrointestinal:   See history of present illness Genitourinary: Negative for dysuria. Musculoskeletal: Negative lower extremity swelling Skin: Negative for rash. Neurological: Negative for severe headaches, focal weakness or numbness. 10-point ROS otherwise negative.  ____________________________________________   PHYSICAL EXAM:  VITAL SIGNS: ED Triage Vitals  Enc Vitals Group     BP 08/14/16 1627 (!) 113/49     Pulse Rate 08/14/16 1627 64     Resp 08/14/16 1627 18     Temp 08/14/16 1627 98.8 F (  37.1 C)     Temp Source 08/14/16 1627 Oral     SpO2 08/14/16 1627 100 %     Weight 08/14/16 1628 (!) 307 lb (139.3 kg)     Height 08/14/16 1628 '5\' 11"'  (1.803 m)     Head Circumference --      Peak Flow --      Pain Score 08/14/16 1627 3     Pain Loc --      Pain Edu? --      Excl. in [place]? --     Constitutional: Alert and oriented. Well appearing and in no acute distress. Eyes: Conjunctivae are normal. PERRL. EOMI. Head: Atraumatic. Nose: No congestion/rhinnorhea. Mouth/Throat: Mucous membranes are moist.  Oropharynx non-erythematous. Neck: No stridor.   Nontender with no meningismus Cardiovascular: Normal rate, regular rhythm. Grossly normal heart sounds.  Good peripheral circulation. Respiratory: Normal respiratory effort.  No retractions. Lungs CTAB. Abdominal: Soft and nontender. No distention. No guarding no rebound Back:  There is no focal tenderness or step off.  there is no midline tenderness there are no lesions noted. there is no CVA tenderness Musculoskeletal: No lower extremity tenderness, no upper extremity tenderness. No joint effusions, no DVT signs strong distal pulses no edema Neurologic:  Normal speech and language. No gross focal neurologic deficits are appreciated.  Skin:  Skin is warm, dry and intact. No rash noted. Psychiatric: Mood and affect are normal. Speech and behavior are normal.  ____________________________________________    LABS (all labs ordered are listed, but only abnormal results are displayed)  Labs Reviewed  COMPREHENSIVE METABOLIC PANEL - Abnormal; Notable for the following:       Result Value   Glucose, Bld 106 (*)    Alkaline Phosphatase 138 (*)    All other components within normal limits  CBC - Abnormal; Notable for the following:    Hemoglobin 11.8 (*)    RDW 14.9 (*)    All other components within normal limits  LIPASE, BLOOD  URINALYSIS, COMPLETE (UACMP) WITH MICROSCOPIC   ____________________________________________  EKG  I personally interpreted any EKGs ordered by me or triage  ____________________________________________  RADIOLOGY  I reviewed any imaging ordered by me or triage that were performed during my shift and, if possible, patient and/or family made aware of any abnormal findings. ____________________________________________   PROCEDURES  Procedure(s) performed: None  Procedures  Critical Care performed: None  ____________________________________________   INITIAL IMPRESSION / ASSESSMENT AND PLAN / ED COURSE  Pertinent labs & imaging results that were available during my care of the patient were reviewed by me and considered in my medical decision making (see chart for details).  Patient with nausea vomiting and diarrhea and some cramping her abdomen is benign to me. She has had some diarrhea here. She has no real risk factors for C. difficile and denies history of that, she has no history of recent antibiotics. Her abdomen does not show any evidence of appendicitis, gallbladder disease, obstruction volvulus etc. Patient's serial exams are reassuring. She was able to tolerate by mouth after antiemetics. She does not appear to be markedly dehydrated. Her blood work is reassuring. There is a slight bump in her alkaline phosphatase however that was present when we checked several years ago as well as she has no right upper quadrant pain or tenderness. White count  is reassuring. And usually not have copious diarrhea with gallbladder issues. Bilirubin is normal.  We will try by mouth challenge and will see how she does. Patient appears  to have a viral gastroenteritis with clear sick contacts.  ----------------------------------------- 8:52 PM on 08/14/2016 -----------------------------------------  Abdominal exam completely benign at discharge with no complaints of pain and no evidence of tenderness. Patient is tolerating by mouth feels much better and is eager to leave. Return precautions and follow-up given and understood.  ----------------------------------------- 9:16 PM on 08/14/2016 -----------------------------------------  Discharge blood pressure is elevated, in the context of this disease process I actually find that to be reassuring. Patient is on 4 different blood pressure my cases and has not taken any of them today so we would expect her blood pressure to be elevated. She would prefer not to stay for [place] to find her blood pressure medications here and administer them. Patient has all for blood pressure medications at home. Now that she is not vomiting she feels that she can take them. She will go home and take them. She has no chest pain and again this is a diarrheal predominant illness with associated vomiting.At this time, there does not appear to be clinical evidence to support the diagnosis of pulmonary embolus, dissection, myocarditis, endocarditis, pericarditis, pericardial tamponade, acute coronary syndrome, pneumothorax, pneumonia, or any other acute intrathoracic pathology that will require admission or acute intervention. Nor is there evidence of any significant intra-abdominal pathology.      ____________________________________________   FINAL CLINICAL IMPRESSION(S) / ED DIAGNOSES  Final diagnoses:  None      This chart was dictated using voice recognition software.  Despite best efforts to proofread,  errors can occur  which can change meaning.      Schuyler Amor, MD 08/14/16 2575    Schuyler Amor, MD 08/14/16 0518    Schuyler Amor, MD 08/14/16 2117

## 2016-08-21 ENCOUNTER — Ambulatory Visit: Payer: Medicare HMO | Admitting: Family

## 2016-08-21 ENCOUNTER — Ambulatory Visit: Payer: Medicare HMO | Attending: Specialist

## 2016-08-21 DIAGNOSIS — I10 Essential (primary) hypertension: Secondary | ICD-10-CM | POA: Insufficient documentation

## 2016-08-21 DIAGNOSIS — R0681 Apnea, not elsewhere classified: Secondary | ICD-10-CM | POA: Insufficient documentation

## 2016-08-21 DIAGNOSIS — E669 Obesity, unspecified: Secondary | ICD-10-CM | POA: Insufficient documentation

## 2016-08-28 ENCOUNTER — Encounter: Payer: Self-pay | Admitting: Family

## 2016-08-28 ENCOUNTER — Ambulatory Visit: Payer: Medicare HMO | Attending: Family | Admitting: Family

## 2016-08-28 VITALS — BP 140/70 | HR 60 | Resp 20 | Ht 71.0 in | Wt 310.4 lb

## 2016-08-28 DIAGNOSIS — Z794 Long term (current) use of insulin: Secondary | ICD-10-CM | POA: Diagnosis not present

## 2016-08-28 DIAGNOSIS — Z833 Family history of diabetes mellitus: Secondary | ICD-10-CM | POA: Diagnosis not present

## 2016-08-28 DIAGNOSIS — Z87891 Personal history of nicotine dependence: Secondary | ICD-10-CM | POA: Insufficient documentation

## 2016-08-28 DIAGNOSIS — R569 Unspecified convulsions: Secondary | ICD-10-CM | POA: Insufficient documentation

## 2016-08-28 DIAGNOSIS — J449 Chronic obstructive pulmonary disease, unspecified: Secondary | ICD-10-CM | POA: Insufficient documentation

## 2016-08-28 DIAGNOSIS — Z886 Allergy status to analgesic agent status: Secondary | ICD-10-CM | POA: Diagnosis not present

## 2016-08-28 DIAGNOSIS — F329 Major depressive disorder, single episode, unspecified: Secondary | ICD-10-CM | POA: Insufficient documentation

## 2016-08-28 DIAGNOSIS — M549 Dorsalgia, unspecified: Secondary | ICD-10-CM | POA: Diagnosis not present

## 2016-08-28 DIAGNOSIS — I5032 Chronic diastolic (congestive) heart failure: Secondary | ICD-10-CM | POA: Insufficient documentation

## 2016-08-28 DIAGNOSIS — E669 Obesity, unspecified: Secondary | ICD-10-CM | POA: Insufficient documentation

## 2016-08-28 DIAGNOSIS — Z818 Family history of other mental and behavioral disorders: Secondary | ICD-10-CM | POA: Insufficient documentation

## 2016-08-28 DIAGNOSIS — F419 Anxiety disorder, unspecified: Secondary | ICD-10-CM | POA: Diagnosis not present

## 2016-08-28 DIAGNOSIS — R0683 Snoring: Secondary | ICD-10-CM | POA: Diagnosis not present

## 2016-08-28 DIAGNOSIS — Z79899 Other long term (current) drug therapy: Secondary | ICD-10-CM | POA: Insufficient documentation

## 2016-08-28 DIAGNOSIS — Z955 Presence of coronary angioplasty implant and graft: Secondary | ICD-10-CM | POA: Diagnosis not present

## 2016-08-28 DIAGNOSIS — I2510 Atherosclerotic heart disease of native coronary artery without angina pectoris: Secondary | ICD-10-CM | POA: Diagnosis not present

## 2016-08-28 DIAGNOSIS — G8929 Other chronic pain: Secondary | ICD-10-CM | POA: Diagnosis not present

## 2016-08-28 DIAGNOSIS — I10 Essential (primary) hypertension: Secondary | ICD-10-CM

## 2016-08-28 DIAGNOSIS — Z6841 Body Mass Index (BMI) 40.0 and over, adult: Secondary | ICD-10-CM | POA: Insufficient documentation

## 2016-08-28 DIAGNOSIS — I252 Old myocardial infarction: Secondary | ICD-10-CM | POA: Diagnosis not present

## 2016-08-28 DIAGNOSIS — E119 Type 2 diabetes mellitus without complications: Secondary | ICD-10-CM | POA: Insufficient documentation

## 2016-08-28 DIAGNOSIS — I509 Heart failure, unspecified: Secondary | ICD-10-CM | POA: Diagnosis present

## 2016-08-28 DIAGNOSIS — I110 Hypertensive heart disease with heart failure: Secondary | ICD-10-CM | POA: Diagnosis not present

## 2016-08-28 DIAGNOSIS — Z8249 Family history of ischemic heart disease and other diseases of the circulatory system: Secondary | ICD-10-CM | POA: Insufficient documentation

## 2016-08-28 DIAGNOSIS — Z823 Family history of stroke: Secondary | ICD-10-CM | POA: Insufficient documentation

## 2016-08-28 NOTE — Patient Instructions (Signed)
Continue weighing daily and call for an overnight weight gain of > 2 pounds or a weekly weight gain of >5 pounds.  Maintain fluid intake to 40-50 ounces daily.

## 2016-08-28 NOTE — Progress Notes (Signed)
Patient ID: Tina Hayes, female    DOB: May 21, 1945, 71 y.o.   MRN: [id number]  HPI  Ms Manninen is a 71 y/o female with a history of seizures, HTN, cardiac stents, DM, depression, CAD, COPD, chronic back pain, anxiety, obesity, remote tobacco use and chronic heart failure.   Last echo was done 06/17/16 and showed an EF of 60-65% along with mild MR and moderate TR. Pulmonary systolic pressure was mildly to moderately elevated. Stress test done 06/18/16.  Was in the ED 08/14/16 due to nausea, vomiting and diarrhea. Evaluated and discharged home.  Admitted 06/16/16 with chest pain. Had negative troponins. Cardiology consult done and patient underwent stress test which was read as low risk for ischemia. Medications were adjusted and she was discharged home after 2 days. Admitted 08/28/15 due to COPD exacerbation. Received IV steroids, nebulizers and antibiotics. She was discharged home after 2 days.   She presents today for her follow-up visit with a chief complaint of moderate shortness of breath with minimal exertion. It is relieved by rest and has been present for the last few months. She has associated fatigue and snoring along with this.   Past Medical History:  Diagnosis Date  . Anxiety   . CHF (congestive heart failure) ([place])   . Chronic back pain   . COPD (chronic obstructive pulmonary disease) ([place])   . Coronary artery disease   . Depression   . Diabetes mellitus without complication ([place])   . H/O heart artery stent 06/17/2016   5 stents per patient  . Heart attack ([place])    Total of 3 per pt.  . Hypertension   . Seizures ([place])    Past Surgical History:  Procedure Laterality Date  . ANKLE SURGERY    . CORONARY ARTERY BYPASS GRAFT    . KNEE SURGERY     Family History  Problem Relation Age of Onset  . Diabetes    . Hypertension    . Diabetes Mother   . Heart failure Mother   . Heart disease Mother   . Heart attack Mother   . Stroke Mother   . Depression Mother   . Hypertension  Mother   . Cancer Sister     brain  . Hypertension Sister   . Diabetes Brother   . Hypertension Brother   . Heart failure Sister   . Heart attack Sister   . SIDS Sister    Social History  Substance Use Topics  . Smoking status: Former Smoker    Packs/day: 0.25    Years: 20.00    Types: Cigarettes    Quit date: 07/01/2006  . Smokeless tobacco: Never Used  . Alcohol use No   Allergies  Allergen Reactions  . Aspirin Anaphylaxis   Prior to Admission medications   Medication Sig Start Date End Date Taking? Authorizing Provider  ACCU-CHEK AVIVA PLUS test strip  05/19/16  Yes Historical Provider, MD  albuterol (PROVENTIL HFA;VENTOLIN HFA) 108 (90 Base) MCG/ACT inhaler Inhale 2 puffs into the lungs every 6 (six) hours as needed for wheezing or shortness of breath. 08/30/15  Yes Epifanio Lesches, MD  amLODipine (NORVASC) 5 MG tablet Take 1 tablet (5 mg total) by mouth daily. 08/12/16  Yes Alisa Graff, FNP  atorvastatin (LIPITOR) 40 MG tablet Take 40 mg by mouth daily at 6 PM.  07/10/16  Yes Historical Provider, MD  Blood Glucose Monitoring Suppl (ACCU-CHEK AVIVA PLUS) w/Device KIT  06/13/16  Yes Historical Provider, MD  carvedilol (COREG) 3.125  MG tablet Take 1 tablet (3.125 mg total) by mouth 2 (two) times daily with a meal. 08/12/16  Yes Alisa Graff, FNP  cetirizine (ZYRTEC) 10 MG tablet Take 10 mg by mouth daily.   Yes Historical Provider, MD  citalopram (CELEXA) 20 MG tablet Take 20 mg by mouth daily.   Yes Historical Provider, MD  cyclobenzaprine (FLEXERIL) 5 MG tablet Take 5 mg by mouth daily.  07/10/16  Yes Historical Provider, MD  furosemide (LASIX) 40 MG tablet Take 1 tablet (40 mg total) by mouth 2 (two) times daily. 08/12/16  Yes Alisa Graff, FNP  hydrALAZINE (APRESOLINE) 25 MG tablet Take 25 mg by mouth 3 (three) times daily.  07/10/16  Yes Historical Provider, MD  insulin aspart (NOVOLOG) 100 UNIT/ML injection Inject 5 Units into the skin daily. 10 units in the morning 5  units at lunch   Yes Historical Provider, MD  insulin glargine (LANTUS) 100 UNIT/ML injection Inject 0.2-0.3 mLs (20-30 Units total) into the skin 2 (two) times daily. Take 20 units in morning and 30 units at night Patient taking differently: Inject 20-30 Units into the skin 2 (two) times daily. 40 units at bedtime 06/18/16 08/12/16 Yes Sital Mody, MD  isosorbide mononitrate (IMDUR) 30 MG 24 hr tablet Take 1 tablet (30 mg total) by mouth 2 (two) times daily. 08/12/16  Yes Alisa Graff, FNP  levETIRAcetam (KEPPRA) 750 MG tablet Take 1 tablet (750 mg total) by mouth 2 (two) times daily. 06/05/15  Yes Ripudeep Krystal Eaton, MD  losartan (COZAAR) 100 MG tablet Take 1 tablet (100 mg total) by mouth daily. 08/12/16  Yes Alisa Graff, FNP  meloxicam (MOBIC) 7.5 MG tablet Take 1 tablet (7.5 mg total) by mouth daily. 06/05/15  Yes Ripudeep Krystal Eaton, MD  nitroGLYCERIN (NITROSTAT) 0.4 MG SL tablet Place 1 tablet (0.4 mg total) under the tongue every 5 (five) minutes as needed for chest pain. 07/01/16 07/01/17 Yes Minna Merritts, MD  omeprazole (PRILOSEC) 40 MG capsule Take 1 capsule (40 mg total) by mouth 2 (two) times daily. Patient taking differently: Take 40 mg by mouth daily.  06/05/15  Yes Ripudeep Krystal Eaton, MD  potassium chloride SA (K-DUR,KLOR-CON) 20 MEQ tablet Take 1 tablet (20 mEq total) by mouth daily. 08/12/16  Yes Alisa Graff, FNP  tiotropium (SPIRIVA) 18 MCG inhalation capsule Place 18 mcg into inhaler and inhale daily.   Yes Historical Provider, MD  traMADol (ULTRAM) 50 MG tablet Take 2 tablets (100 mg total) by mouth every 6 (six) hours as needed for moderate pain. 07/29/16  Yes Molli Barrows, MD  traZODone (DESYREL) 50 MG tablet Take 50 mg by mouth at bedtime.   Yes Historical Provider, MD  UNIFINE PENTIPS 31G X 6 MM [place]  07/23/16  Yes Historical Provider, MD  clopidogrel (PLAVIX) 75 MG tablet Take 1 tablet (75 mg total) by mouth daily. 08/12/16   Alisa Graff, FNP    Review of Systems  Constitutional: Positive  for fatigue (improving). Negative for appetite change.  HENT: Positive for trouble swallowing (feels like food gets stuck sometimes  ). Negative for congestion, postnasal drip and sore throat.   Eyes: Negative.   Respiratory: Positive for shortness of breath (when lying down flat at night). Negative for cough.   Cardiovascular: Negative for chest pain, palpitations and leg swelling.  Gastrointestinal: Negative for abdominal distention and abdominal pain.  Endocrine: Negative.   Genitourinary: Negative.   Musculoskeletal: Positive for back pain (chronic). Negative  for neck pain.  Skin: Negative.   Allergic/Immunologic: Negative.   Neurological: Negative for dizziness and light-headedness.  Hematological: Negative for adenopathy. Does not bruise/bleed easily.  Psychiatric/Behavioral: Positive for sleep disturbance (+ snoring). Negative for dysphoric mood and suicidal ideas. The patient is not nervous/anxious.    Vitals:   08/28/16 1035  Pulse: 60  Resp: 20  SpO2: 100%  Weight: (!) 310 lb 6 oz (140.8 kg)  Height: '5\' 11"'  (1.803 m)   Wt Readings from Last 3 Encounters:  08/28/16 (!) 310 lb 6 oz (140.8 kg)  08/14/16 (!) 307 lb (139.3 kg)  08/12/16 (!) 317 lb (143.8 kg)   Lab Results  Component Value Date   CREATININE 0.69 08/14/2016   CREATININE 0.93 08/12/2016   CREATININE 0.84 06/17/2016   Physical Exam  Constitutional: She is oriented to person, place, and time. She appears well-developed and well-nourished.  HENT:  Head: Normocephalic and atraumatic.  Neck: Normal range of motion. Neck supple. No JVD present.  Cardiovascular: Regular rhythm.  Bradycardia present.   Pulmonary/Chest: Effort normal. She has no wheezes. She has no rales.  Abdominal: Soft. She exhibits no distension. There is no tenderness.  Musculoskeletal: She exhibits no edema or tenderness.  Neurological: She is alert and oriented to person, place, and time.  Skin: Skin is warm and dry.  Psychiatric: She  has a normal mood and affect. Her behavior is normal.  Nursing note and vitals reviewed.   Assessment & Plan:  1: Chronic heart failure with preserved ejection fraction- - NYHA class III - euvolemic - weighing daily and says that her home weight has decreased.  Reminded to call for an overnight weight gain of >2 pounds or a weekly weight gain of >5 pounds. Has lost 6.4 pounds since she was last here March 2018. - drinking about 60-80 ounces of fluid daily. Discussed decreasing consumption to 40-50 ounces daily.  - not adding salt and is using pepper - saw her cardiologist Rockey Situ) 07/01/16; returns 10/02/16  2: HTN- - BP looks good today - BMP from 08/14/16 reviewed; potassium 4.3 and GFR >60 - sees PCP ([facility]) next week  3: Snoring- - patient endorses snoring and waking herself up gasping - feels tired upon awakening and tired throughout the day - sleep study completed 08/21/16  Medication bottles were reviewed.   Return in 3 months or sooner for any questions/problems before then.

## 2016-09-11 ENCOUNTER — Telehealth: Payer: Self-pay | Admitting: Family

## 2016-09-11 NOTE — Telephone Encounter (Signed)
LM that sleep study that was performed on 08/21/16 showed rare obstructive sleep apnea which did not meet criteria for cpap titration.

## 2016-09-17 ENCOUNTER — Telehealth: Payer: Self-pay | Admitting: Cardiovascular Disease

## 2016-09-17 ENCOUNTER — Encounter: Payer: Self-pay | Admitting: Anesthesiology

## 2016-09-17 ENCOUNTER — Ambulatory Visit: Payer: Medicare HMO | Attending: Anesthesiology | Admitting: Anesthesiology

## 2016-09-17 VITALS — BP 107/56 | HR 58 | Temp 97.9°F | Resp 16 | Ht 71.0 in | Wt 307.0 lb

## 2016-09-17 DIAGNOSIS — Z7902 Long term (current) use of antithrombotics/antiplatelets: Secondary | ICD-10-CM | POA: Diagnosis not present

## 2016-09-17 DIAGNOSIS — M25512 Pain in left shoulder: Secondary | ICD-10-CM | POA: Insufficient documentation

## 2016-09-17 DIAGNOSIS — I252 Old myocardial infarction: Secondary | ICD-10-CM | POA: Insufficient documentation

## 2016-09-17 DIAGNOSIS — Z87891 Personal history of nicotine dependence: Secondary | ICD-10-CM | POA: Diagnosis not present

## 2016-09-17 DIAGNOSIS — I509 Heart failure, unspecified: Secondary | ICD-10-CM | POA: Diagnosis not present

## 2016-09-17 DIAGNOSIS — E119 Type 2 diabetes mellitus without complications: Secondary | ICD-10-CM | POA: Insufficient documentation

## 2016-09-17 DIAGNOSIS — J449 Chronic obstructive pulmonary disease, unspecified: Secondary | ICD-10-CM | POA: Insufficient documentation

## 2016-09-17 DIAGNOSIS — M5136 Other intervertebral disc degeneration, lumbar region: Secondary | ICD-10-CM

## 2016-09-17 DIAGNOSIS — Z8249 Family history of ischemic heart disease and other diseases of the circulatory system: Secondary | ICD-10-CM | POA: Diagnosis not present

## 2016-09-17 DIAGNOSIS — F419 Anxiety disorder, unspecified: Secondary | ICD-10-CM | POA: Insufficient documentation

## 2016-09-17 DIAGNOSIS — F329 Major depressive disorder, single episode, unspecified: Secondary | ICD-10-CM | POA: Diagnosis not present

## 2016-09-17 DIAGNOSIS — I219 Acute myocardial infarction, unspecified: Secondary | ICD-10-CM | POA: Diagnosis not present

## 2016-09-17 DIAGNOSIS — R569 Unspecified convulsions: Secondary | ICD-10-CM | POA: Diagnosis not present

## 2016-09-17 DIAGNOSIS — M5432 Sciatica, left side: Secondary | ICD-10-CM | POA: Diagnosis not present

## 2016-09-17 DIAGNOSIS — Z955 Presence of coronary angioplasty implant and graft: Secondary | ICD-10-CM | POA: Insufficient documentation

## 2016-09-17 DIAGNOSIS — I110 Hypertensive heart disease with heart failure: Secondary | ICD-10-CM | POA: Insufficient documentation

## 2016-09-17 DIAGNOSIS — M5431 Sciatica, right side: Secondary | ICD-10-CM

## 2016-09-17 DIAGNOSIS — M170 Bilateral primary osteoarthritis of knee: Secondary | ICD-10-CM

## 2016-09-17 DIAGNOSIS — I2510 Atherosclerotic heart disease of native coronary artery without angina pectoris: Secondary | ICD-10-CM | POA: Insufficient documentation

## 2016-09-17 DIAGNOSIS — M549 Dorsalgia, unspecified: Secondary | ICD-10-CM | POA: Diagnosis present

## 2016-09-17 DIAGNOSIS — Z823 Family history of stroke: Secondary | ICD-10-CM | POA: Diagnosis not present
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
## 2016-09-17 NOTE — Patient Instructions (Signed)
Epidural Steroid Injection Patient Information  Description: The epidural space surrounds the nerves as they exit the spinal cord.  In some patients, the nerves can be compressed and inflamed by a bulging disc or a tight spinal canal (spinal stenosis).  By injecting steroids into the epidural space, we can bring irritated nerves into direct contact with a potentially helpful medication.  These steroids act directly on the irritated nerves and can reduce swelling and inflammation which often leads to decreased pain.  Epidural steroids may be injected anywhere along the spine and from the neck to the low back depending upon the location of your pain.   After numbing the skin with local anesthetic (like Novocaine), a small needle is passed into the epidural space slowly.  You may experience a sensation of pressure while this is being done.  The entire block usually last less than 10 minutes.  Conditions which may be treated by epidural steroids:   Low back and leg pain  Neck and arm pain  Spinal stenosis  Post-laminectomy syndrome  Herpes zoster (shingles) pain  Pain from compression fractures  Preparation for the injection:  1. Do not eat any solid food or dairy products within 8 hours of your appointment.  2. You may drink clear liquids up to 3 hours before appointment.  Clear liquids include water, black coffee, juice or soda.  No milk or cream please. 3. You may take your regular medication, including pain medications, with a sip of water before your appointment  Diabetics should hold regular insulin (if taken separately) and take 1/2 normal NPH dos the morning of the procedure.  Carry some sugar containing items with you to your appointment. 4. A driver must accompany you and be prepared to drive you home after your procedure.  5. Bring all your current medications with your. 6. An IV may be inserted and sedation may be given at the discretion of the physician.   7. A blood pressure  cuff, EKG and other monitors will often be applied during the procedure.  Some patients may need to have extra oxygen administered for a short period. 8. You will be asked to provide medical information, including your allergies, prior to the procedure.  We must know immediately if you are taking blood thinners (like Coumadin/Warfarin)  Or if you are allergic to IV iodine contrast (dye). We must know if you could possible be pregnant.  Possible side-effects:  Bleeding from needle site  Infection (rare, may require surgery)  Nerve injury (rare)  Numbness & tingling (temporary)  Difficulty urinating (rare, temporary)  Spinal headache ( a headache worse with upright posture)  Light -headedness (temporary)  Pain at injection site (several days)  Decreased blood pressure (temporary)  Weakness in arm/leg (temporary)  Pressure sensation in back/neck (temporary)  Call if you experience:  Fever/chills associated with headache or increased back/neck pain.  Headache worsened by an upright position.  New onset weakness or numbness of an extremity below the injection site  Hives or difficulty breathing (go to the emergency room)  Inflammation or drainage at the infection site  Severe back/neck pain  Any new symptoms which are concerning to you  Please note:  Although the local anesthetic injected can often make your back or neck feel good for several hours after the injection, the pain will likely return.  It takes 3-7 days for steroids to work in the epidural space.  You may not notice any pain relief for at least that one week.    If effective, we will often do a series of three injections spaced 3-6 weeks apart to maximally decrease your pain.  After the initial series, we generally will wait several months before considering a repeat injection of the same type.  If you have any questions, please call [phone] [facility] Clinic[facility]AL RISKS AND  COMPLICATIONS  What are the risk, side effects and possible complications? Generally speaking, most procedures are safe.  However, with any procedure there are risks, side effects, and the possibility of complications.  The risks and complications are dependent upon the sites that are lesioned, or the type of nerve block to be performed.  The closer the procedure is to the spine, the more serious the risks are.  Great care is taken when placing the radio frequency needles, block needles or lesioning probes, but sometimes complications can occur. 1. Infection: Any time there is an injection through the skin, there is a risk of infection.  This is why sterile conditions are used for these blocks.  There are four possible types of infection. 1. Localized skin infection. 2. Central Nervous System Infection-This can be in the form of Meningitis, which can be deadly. 3. Epidural Infections-This can be in the form of an epidural abscess, which can cause pressure inside of the spine, causing compression of the spinal cord with subsequent paralysis. This would require an emergency surgery to decompress, and there are no guarantees that the patient would recover from the paralysis. 4. Discitis-This is an infection of the intervertebral discs.  It occurs in about 1% of discography procedures.  It is difficult to treat and it may lead to surgery.        2. Pain: the needles have to go through skin and soft tissues, will cause soreness.       3. Damage to internal structures:  The nerves to be lesioned may be near blood vessels or    other nerves which can be potentially damaged.       4. Bleeding: Bleeding is more common if the patient is taking blood thinners such as  aspirin, Coumadin, Ticiid, Plavix, etc., or if he/she have some genetic predisposition  such as hemophilia. Bleeding into the spinal canal can cause compression of the spinal  cord with subsequent paralysis.  This would require an emergency surgery  to  decompress and there are no guarantees that the patient would recover from the  paralysis.       5. Pneumothorax:  Puncturing of a lung is a possibility, every time a needle is introduced in  the area of the chest or upper back.  Pneumothorax refers to free air around the  collapsed lung(s), inside of the thoracic cavity (chest cavity).  Another two possible  complications related to a similar event would include: Hemothorax and Chylothorax.   These are variations of the Pneumothorax, where instead of air around the collapsed  lung(s), you may have blood or chyle, respectively.       6. Spinal headaches: They may occur with any procedures in the area of the spine.       7. Persistent CSF (Cerebro-Spinal Fluid) leakage: This is a rare problem, but may occur  with prolonged intrathecal or epidural catheters either due to the formation of a fistulous  track or a dural tear.       8. Nerve damage: By working so close to the spinal cord, there is always a possibility of  nerve damage, which could be   as serious as a permanent spinal cord injury with  paralysis.       9. Death:  Although rare, severe deadly allergic reactions known as "Anaphylactic  reaction" can occur to any of the medications used.      10. Worsening of the symptoms:  We can always make thing worse.  What are the chances of something like this happening? Chances of any of this occuring are extremely low.  By statistics, you have more of a chance of getting killed in a motor vehicle accident: while driving to the hospital than any of the above occurring .  Nevertheless, you should be aware that they are possibilities.  In general, it is similar to taking a shower.  Everybody knows that you can slip, hit your head and get killed.  Does that mean that you should not shower again?  Nevertheless always keep in mind that statistics do not mean anything if you happen to be on the wrong side of them.  Even if a procedure has a 1 (one) in a 1,000,000  (million) chance of going wrong, it you happen to be that one..Also, keep in mind that by statistics, you have more of a chance of having something go wrong when taking medications.  Who should not have this procedure? If you are on a blood thinning medication (e.g. Coumadin, Plavix, see list of "Blood Thinners"), or if you have an active infection going on, you should not have the procedure.  If you are taking any blood thinners, please inform your physician.  How should I prepare for this procedure?  Do not eat or drink anything at least six hours prior to the procedure.  Bring a driver with you .  It cannot be a taxi.  Come accompanied by an adult that can drive you back, and that is strong enough to help you if your legs get weak or numb from the local anesthetic.  Take all of your medicines the morning of the procedure with just enough water to swallow them.  If you have diabetes, make sure that you are scheduled to have your procedure done first thing in the morning, whenever possible.  If you have diabetes, take only half of your insulin dose and notify our nurse that you have done so as soon as you arrive at the clinic.  If you are diabetic, but only take blood sugar pills (oral hypoglycemic), then do not take them on the morning of your procedure.  You may take them after you have had the procedure.  Do not take aspirin or any aspirin-containing medications, at least eleven (11) days prior to the procedure.  They may prolong bleeding.  Wear loose fitting clothing that may be easy to take off and that you would not mind if it got stained with Betadine or blood.  Do not wear any jewelry or perfume  Remove any nail coloring.  It will interfere with some of our monitoring equipment.  NOTE: Remember that this is not meant to be interpreted as a complete list of all possible complications.  Unforeseen problems may occur.  BLOOD THINNERS The following drugs contain aspirin or other  products, which can cause increased bleeding during surgery and should not be taken for 2 weeks prior to and 1 week after surgery.  If you should need take something for relief of minor pain, you may take acetaminophen which is found in Tylenol,m Datril, Anacin-3 and Panadol. It is not blood thinner. The products listed below   are.  Do not take any of the products listed below in addition to any listed on your instruction sheet.  A.P.C or A.P.C with Codeine Codeine Phosphate Capsules #3 Ibuprofen Ridaura  ABC compound Congesprin Imuran rimadil  Advil Cope Indocin Robaxisal  Alka-Seltzer Effervescent Pain Reliever and Antacid Coricidin or Coricidin-D  Indomethacin Rufen  Alka-Seltzer plus Cold Medicine Cosprin Ketoprofen S-A-C Tablets  Anacin Analgesic Tablets or Capsules Coumadin Korlgesic Salflex  Anacin Extra Strength Analgesic tablets or capsules CP-2 Tablets Lanoril Salicylate  Anaprox Cuprimine Capsules Levenox Salocol  Anexsia-D Dalteparin Magan Salsalate  Anodynos Darvon compound Magnesium Salicylate Sine-off  Ansaid Dasin Capsules Magsal Sodium Salicylate  Anturane Depen Capsules Marnal Soma  APF Arthritis pain formula Dewitt's Pills Measurin Stanback  Argesic Dia-Gesic Meclofenamic Sulfinpyrazone  Arthritis Bayer Timed Release Aspirin Diclofenac Meclomen Sulindac  Arthritis pain formula Anacin Dicumarol Medipren Supac  Analgesic (Safety coated) Arthralgen Diffunasal Mefanamic Suprofen  Arthritis Strength Bufferin Dihydrocodeine Mepro Compound Suprol  Arthropan liquid Dopirydamole Methcarbomol with Aspirin Synalgos  ASA tablets/Enseals Disalcid Micrainin Tagament  Ascriptin Doan's Midol Talwin  Ascriptin A/D Dolene Mobidin Tanderil  Ascriptin Extra Strength Dolobid Moblgesic Ticlid  Ascriptin with Codeine Doloprin or Doloprin with Codeine Momentum Tolectin  Asperbuf Duoprin Mono-gesic Trendar  Aspergum Duradyne Motrin or Motrin IB Triminicin  Aspirin plain, buffered or enteric  coated Durasal Myochrisine Trigesic  Aspirin Suppositories Easprin Nalfon Trillsate  Aspirin with Codeine Ecotrin Regular or Extra Strength Naprosyn Uracel  Atromid-S Efficin Naproxen Ursinus  Auranofin Capsules Elmiron Neocylate Vanquish  Axotal Emagrin Norgesic Verin  Azathioprine Empirin or Empirin with Codeine Normiflo Vitamin E  Azolid Emprazil Nuprin Voltaren  Bayer Aspirin plain, buffered or children's or timed BC Tablets or powders Encaprin Orgaran Warfarin Sodium  Buff-a-Comp Enoxaparin Orudis Zorpin  Buff-a-Comp with Codeine Equegesic Os-Cal-Gesic   Buffaprin Excedrin plain, buffered or Extra Strength Oxalid   Bufferin Arthritis Strength Feldene Oxphenbutazone   Bufferin plain or Extra Strength Feldene Capsules Oxycodone with Aspirin   Bufferin with Codeine Fenoprofen Fenoprofen Pabalate or Pabalate-SF   Buffets II Flogesic Panagesic   Buffinol plain or Extra Strength Florinal or Florinal with Codeine Panwarfarin   Buf-Tabs Flurbiprofen Penicillamine   Butalbital Compound Four-way cold tablets Penicillin   Butazolidin Fragmin Pepto-Bismol   Carbenicillin Geminisyn Percodan   Carna Arthritis Reliever Geopen Persantine   Carprofen Gold's salt Persistin   Chloramphenicol Goody's Phenylbutazone   Chloromycetin Haltrain Piroxlcam   Clmetidine heparin Plaquenil   Cllnoril Hyco-pap Ponstel   Clofibrate Hydroxy chloroquine Propoxyphen         Before stopping any of these medications, be sure to consult the physician who ordered them.  Some, such as Coumadin (Warfarin) are ordered to prevent or treat serious conditions such as "deep thrombosis", "pumonary embolisms", and other heart problems.  The amount of time that you may need off of the medication may also vary with the medication and the reason for which you were taking it.  If you are taking any of these medications, please make sure you notify your pain physician before you undergo any procedures.          

## 2016-09-17 NOTE — Progress Notes (Signed)
Nursing Pain Medication Assessment:  Safety precautions to be maintained throughout the outpatient stay will include: orient to surroundings, keep bed in low position, maintain call bell within reach at all times, provide assistance with transfer out of bed and ambulation.  Medication Inspection Compliance: Tina Hayes did not comply with our request to bring her pills to be counted. She was reminded that bringing the medication bottles, even when empty, is a requirement.  Medication: None brought in. Pill/Patch Count: None available to be counted. Bottle Appearance: No container available. Did not bring bottle(s) to appointment. Filled Date: N/A Last Medication intake:  Yesterday   Patient states she did not know to bring medications for count.  Instructed to please bring medication next time with rational. Patient verbalizes u/o information.

## 2016-09-17 NOTE — Telephone Encounter (Signed)
Received cardiac clearance request for pt to proceed w/ epidural steroid injections on 09/23/16. Dr. Marlowe Alt requests clearance and recommendations on holding Plavix 8 days prior to procedure. Please route to [facility] @ [phone].

## 2016-09-18 NOTE — Telephone Encounter (Signed)
Spoke with Anderson Malta RN and let her know that Dr. Rockey Situ stated that it was ok to stop plavix for her procedure. She verbalized understanding with no further questions.

## 2016-09-18 NOTE — Telephone Encounter (Signed)
Received clearance from Dr. Rockey Situ via Olin Hauser that it is ok for patient to be off of PLAvIX for 8 days prior to LESI. Called patient and LVM of above amd instructed to call for any questions or concerns. Dr. Andree Elk aware.

## 2016-09-18 NOTE — Telephone Encounter (Signed)
Anderson Malta RN calling to see if patient can hold plavix for procedure that is scheduled on Tuesday. They need to know today and requested that Dr. Rockey Situ call and speak with Dr. Vashti Hey regarding need to hold plavix. Will route to Dr. Rockey Situ for his review.

## 2016-09-18 NOTE — Telephone Encounter (Signed)
Patient has been non compliant x 3 days and Anderson Malta rn needs to know if she can continue to hold .  Please call today otherwise they will contact patient to have her resume plavix

## 2016-09-23 ENCOUNTER — Ambulatory Visit (HOSPITAL_BASED_OUTPATIENT_CLINIC_OR_DEPARTMENT_OTHER): Payer: Medicare HMO | Admitting: Anesthesiology

## 2016-09-23 ENCOUNTER — Ambulatory Visit
Admission: RE | Admit: 2016-09-23 | Discharge: 2016-09-23 | Disposition: A | Discharge status: Home / Self Care | Payer: Medicare HMO | Source: Ambulatory Visit | Attending: Anesthesiology | Admitting: Anesthesiology

## 2016-09-23 ENCOUNTER — Other Ambulatory Visit: Payer: Self-pay | Admitting: Anesthesiology

## 2016-09-23 ENCOUNTER — Encounter: Payer: Self-pay | Admitting: Anesthesiology

## 2016-09-23 VITALS — BP 142/96 | HR 65 | Temp 97.1°F | Resp 20 | Ht 71.0 in | Wt 307.0 lb

## 2016-09-23 DIAGNOSIS — F419 Anxiety disorder, unspecified: Secondary | ICD-10-CM | POA: Diagnosis not present

## 2016-09-23 DIAGNOSIS — M5431 Sciatica, right side: Secondary | ICD-10-CM

## 2016-09-23 DIAGNOSIS — Z823 Family history of stroke: Secondary | ICD-10-CM | POA: Diagnosis not present

## 2016-09-23 DIAGNOSIS — I509 Heart failure, unspecified: Secondary | ICD-10-CM | POA: Diagnosis not present

## 2016-09-23 DIAGNOSIS — E119 Type 2 diabetes mellitus without complications: Secondary | ICD-10-CM | POA: Diagnosis not present

## 2016-09-23 DIAGNOSIS — R52 Pain, unspecified: Secondary | ICD-10-CM

## 2016-09-23 DIAGNOSIS — Z951 Presence of aortocoronary bypass graft: Secondary | ICD-10-CM | POA: Insufficient documentation

## 2016-09-23 DIAGNOSIS — I252 Old myocardial infarction: Secondary | ICD-10-CM | POA: Diagnosis not present

## 2016-09-23 DIAGNOSIS — Z8249 Family history of ischemic heart disease and other diseases of the circulatory system: Secondary | ICD-10-CM | POA: Diagnosis not present

## 2016-09-23 DIAGNOSIS — I2510 Atherosclerotic heart disease of native coronary artery without angina pectoris: Secondary | ICD-10-CM | POA: Diagnosis not present

## 2016-09-23 DIAGNOSIS — J449 Chronic obstructive pulmonary disease, unspecified: Secondary | ICD-10-CM | POA: Insufficient documentation

## 2016-09-23 DIAGNOSIS — Z818 Family history of other mental and behavioral disorders: Secondary | ICD-10-CM | POA: Insufficient documentation

## 2016-09-23 DIAGNOSIS — I110 Hypertensive heart disease with heart failure: Secondary | ICD-10-CM | POA: Insufficient documentation

## 2016-09-23 DIAGNOSIS — M170 Bilateral primary osteoarthritis of knee: Secondary | ICD-10-CM | POA: Insufficient documentation

## 2016-09-23 DIAGNOSIS — Z955 Presence of coronary angioplasty implant and graft: Secondary | ICD-10-CM | POA: Insufficient documentation

## 2016-09-23 DIAGNOSIS — Z794 Long term (current) use of insulin: Secondary | ICD-10-CM | POA: Diagnosis not present

## 2016-09-23 DIAGNOSIS — R569 Unspecified convulsions: Secondary | ICD-10-CM | POA: Insufficient documentation

## 2016-09-23 DIAGNOSIS — Z833 Family history of diabetes mellitus: Secondary | ICD-10-CM | POA: Diagnosis not present

## 2016-09-23 DIAGNOSIS — M5432 Sciatica, left side: Secondary | ICD-10-CM

## 2016-09-23 DIAGNOSIS — Z87891 Personal history of nicotine dependence: Secondary | ICD-10-CM | POA: Insufficient documentation

## 2016-09-23 DIAGNOSIS — M5136 Other intervertebral disc degeneration, lumbar region: Secondary | ICD-10-CM | POA: Diagnosis not present

## 2016-09-23 DIAGNOSIS — F329 Major depressive disorder, single episode, unspecified: Secondary | ICD-10-CM | POA: Insufficient documentation

## 2016-09-23 DIAGNOSIS — Z7902 Long term (current) use of antithrombotics/antiplatelets: Secondary | ICD-10-CM | POA: Insufficient documentation

## 2016-09-23 DIAGNOSIS — M1388 Other specified arthritis, other site: Secondary | ICD-10-CM | POA: Diagnosis not present

## 2016-09-23 DIAGNOSIS — Z79899 Other long term (current) drug therapy: Secondary | ICD-10-CM | POA: Insufficient documentation

## 2016-09-23 DIAGNOSIS — M549 Dorsalgia, unspecified: Secondary | ICD-10-CM | POA: Diagnosis present

## 2016-09-23 MED ORDER — IOPAMIDOL (ISOVUE-M 200) INJECTION 41%
INTRAMUSCULAR | Status: AC
  Filled 2016-09-23: qty 10

## 2016-09-23 MED ORDER — TRIAMCINOLONE ACETONIDE 40 MG/ML IJ SUSP
40.0000 mg | Freq: Once | INTRAMUSCULAR | Status: AC
  Administered 2016-09-23: 40 mg

## 2016-09-23 MED ORDER — MIDAZOLAM HCL 2 MG/2ML IJ SOLN
5.0000 mg | Freq: Once | INTRAMUSCULAR | Status: AC
  Administered 2016-09-23: 5 mg via INTRAVENOUS
  Filled 2016-09-23: qty 5

## 2016-09-23 MED ORDER — ROPIVACAINE HCL 2 MG/ML IJ SOLN
INTRAMUSCULAR | Status: AC
  Filled 2016-09-23: qty 10

## 2016-09-23 MED ORDER — IOPAMIDOL (ISOVUE-M 200) INJECTION 41%
20.0000 mL | Freq: Once | INTRAMUSCULAR | Status: DC | PRN
  Administered 2016-09-23: 10 mL
  Filled 2016-09-23: qty 20

## 2016-09-23 MED ORDER — SODIUM CHLORIDE 0.9 % IJ SOLN
INTRAMUSCULAR | Status: AC
  Filled 2016-09-23: qty 10

## 2016-09-23 MED ORDER — ROPIVACAINE HCL 2 MG/ML IJ SOLN
10.0000 mL | Freq: Once | INTRAMUSCULAR | Status: AC
  Administered 2016-09-23: 10 mL via EPIDURAL

## 2016-09-23 MED ORDER — SODIUM CHLORIDE 0.9% FLUSH
10.0000 mL | Freq: Once | INTRAVENOUS | Status: AC
  Administered 2016-09-23: 10 mL

## 2016-09-23 MED ORDER — TRIAMCINOLONE ACETONIDE 40 MG/ML IJ SUSP
INTRAMUSCULAR | Status: AC
  Filled 2016-09-23: qty 1

## 2016-09-23 MED ORDER — LIDOCAINE HCL (PF) 1 % IJ SOLN
INTRAMUSCULAR | Status: AC
  Filled 2016-09-23: qty 5

## 2016-09-23 MED ORDER — LIDOCAINE HCL (PF) 1 % IJ SOLN
5.0000 mL | Freq: Once | INTRAMUSCULAR | Status: AC
  Administered 2016-09-23: 5 mL via SUBCUTANEOUS

## 2016-09-23 MED ORDER — MIDAZOLAM HCL 5 MG/5ML IJ SOLN
INTRAMUSCULAR | Status: AC
  Filled 2016-09-23: qty 5

## 2016-09-23 MED ORDER — LACTATED RINGERS IV SOLN
1000.0000 mL | INTRAVENOUS | Status: DC
  Administered 2016-09-23: 1000 mL via INTRAVENOUS

## 2016-09-23 NOTE — Progress Notes (Signed)
Safety precautions to be maintained throughout the outpatient stay will include: orient to surroundings, keep bed in low position, maintain call bell within reach at all times, provide assistance with transfer out of bed and ambulation.  
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
## 2016-09-23 NOTE — Patient Instructions (Addendum)
GENERAL RISKS AND COMPLICATIONS  What are the risk, side effects and possible complications? Generally speaking, most procedures are safe.  However, with any procedure there are risks, side effects, and the possibility of complications.  The risks and complications are dependent upon the sites that are lesioned, or the type of nerve block to be performed.  The closer the procedure is to the spine, the more serious the risks are.  Great care is taken when placing the radio frequency needles, block needles or lesioning probes, but sometimes complications can occur. 1. Infection: Any time there is an injection through the skin, there is a risk of infection.  This is why sterile conditions are used for these blocks.  There are four possible types of infection. 1. Localized skin infection. 2. Central Nervous System Infection-This can be in the form of Meningitis, which can be deadly. 3. Epidural Infections-This can be in the form of an epidural abscess, which can cause pressure inside of the spine, causing compression of the spinal cord with subsequent paralysis. This would require an emergency surgery to decompress, and there are no guarantees that the patient would recover from the paralysis. 4. Discitis-This is an infection of the intervertebral discs.  It occurs in about 1% of discography procedures.  It is difficult to treat and it may lead to surgery.        2. Pain: the needles have to go through skin and soft tissues, will cause soreness.       3. Damage to internal structures:  The nerves to be lesioned may be near blood vessels or    other nerves which can be potentially damaged.       4. Bleeding: Bleeding is more common if the patient is taking blood thinners such as  aspirin, Coumadin, Ticiid, Plavix, etc., or if he/she have some genetic predisposition  such as hemophilia. Bleeding into the spinal canal can cause compression of the spinal  cord with subsequent paralysis.  This would require an  emergency surgery to  decompress and there are no guarantees that the patient would recover from the  paralysis.       5. Pneumothorax:  Puncturing of a lung is a possibility, every time a needle is introduced in  the area of the chest or upper back.  Pneumothorax refers to free air around the  collapsed lung(s), inside of the thoracic cavity (chest cavity).  Another two possible  complications related to a similar event would include: Hemothorax and Chylothorax.   These are variations of the Pneumothorax, where instead of air around the collapsed  lung(s), you may have blood or chyle, respectively.       6. Spinal headaches: They may occur with any procedures in the area of the spine.       7. Persistent CSF (Cerebro-Spinal Fluid) leakage: This is a rare problem, but may occur  with prolonged intrathecal or epidural catheters either due to the formation of a fistulous  track or a dural tear.       8. Nerve damage: By working so close to the spinal cord, there is always a possibility of  nerve damage, which could be as serious as a permanent spinal cord injury with  paralysis.       9. Death:  Although rare, severe deadly allergic reactions known as "Anaphylactic  reaction" can occur to any of the medications used.      10. Worsening of the symptoms:  We can always make thing worse.    What are the chances of something like this happening? Chances of any of this occuring are extremely low.  By statistics, you have more of a chance of getting killed in a motor vehicle accident: while driving to the hospital than any of the above occurring .  Nevertheless, you should be aware that they are possibilities.  In general, it is similar to taking a shower.  Everybody knows that you can slip, hit your head and get killed.  Does that mean that you should not shower again?  Nevertheless always keep in mind that statistics do not mean anything if you happen to be on the wrong side of them.  Even if a procedure has a 1  (one) in a 1,000,000 (million) chance of going wrong, it you happen to be that one..Also, keep in mind that by statistics, you have more of a chance of having something go wrong when taking medications.  Who should not have this procedure? If you are on a blood thinning medication (e.g. Coumadin, Plavix, see list of "Blood Thinners"), or if you have an active infection going on, you should not have the procedure.  If you are taking any blood thinners, please inform your physician.  How should I prepare for this procedure?  Do not eat or drink anything at least six hours prior to the procedure.  Bring a driver with you .  It cannot be a taxi.  Come accompanied by an adult that can drive you back, and that is strong enough to help you if your legs get weak or numb from the local anesthetic.  Take all of your medicines the morning of the procedure with just enough water to swallow them.  If you have diabetes, make sure that you are scheduled to have your procedure done first thing in the morning, whenever possible.  If you have diabetes, take only half of your insulin dose and notify our nurse that you have done so as soon as you arrive at the clinic.  If you are diabetic, but only take blood sugar pills (oral hypoglycemic), then do not take them on the morning of your procedure.  You may take them after you have had the procedure.  Do not take aspirin or any aspirin-containing medications, at least eleven (11) days prior to the procedure.  They may prolong bleeding.  Wear loose fitting clothing that may be easy to take off and that you would not mind if it got stained with Betadine or blood.  Do not wear any jewelry or perfume  Remove any nail coloring.  It will interfere with some of our monitoring equipment.  NOTE: Remember that this is not meant to be interpreted as a complete list of all possible complications.  Unforeseen problems may occur.  BLOOD THINNERS The following drugs  contain aspirin or other products, which can cause increased bleeding during surgery and should not be taken for 2 weeks prior to and 1 week after surgery.  If you should need take something for relief of minor pain, you may take acetaminophen which is found in Tylenol,m Datril, Anacin-3 and Panadol. It is not blood thinner. The products listed below are.  Do not take any of the products listed below in addition to any listed on your instruction sheet.  A.P.C or A.P.C with Codeine Codeine Phosphate Capsules #3 Ibuprofen Ridaura  ABC compound Congesprin Imuran rimadil  Advil Cope Indocin Robaxisal  Alka-Seltzer Effervescent Pain Reliever and Antacid Coricidin or Coricidin-D  Indomethacin Rufen    Alka-Seltzer plus Cold Medicine Cosprin Ketoprofen S-A-C Tablets  Anacin Analgesic Tablets or Capsules Coumadin Korlgesic Salflex  Anacin Extra Strength Analgesic tablets or capsules CP-2 Tablets Lanoril Salicylate  Anaprox Cuprimine Capsules Levenox Salocol  Anexsia-D Dalteparin Magan Salsalate  Anodynos Darvon compound Magnesium Salicylate Sine-off  Ansaid Dasin Capsules Magsal Sodium Salicylate  Anturane Depen Capsules Marnal Soma  APF Arthritis pain formula Dewitt's Pills Measurin Stanback  Argesic Dia-Gesic Meclofenamic Sulfinpyrazone  Arthritis Bayer Timed Release Aspirin Diclofenac Meclomen Sulindac  Arthritis pain formula Anacin Dicumarol Medipren Supac  Analgesic (Safety coated) Arthralgen Diffunasal Mefanamic Suprofen  Arthritis Strength Bufferin Dihydrocodeine Mepro Compound Suprol  Arthropan liquid Dopirydamole Methcarbomol with Aspirin Synalgos  ASA tablets/Enseals Disalcid Micrainin Tagament  Ascriptin Doan's Midol Talwin  Ascriptin A/D Dolene Mobidin Tanderil  Ascriptin Extra Strength Dolobid Moblgesic Ticlid  Ascriptin with Codeine Doloprin or Doloprin with Codeine Momentum Tolectin  Asperbuf Duoprin Mono-gesic Trendar  Aspergum Duradyne Motrin or Motrin IB Triminicin  Aspirin  plain, buffered or enteric coated Durasal Myochrisine Trigesic  Aspirin Suppositories Easprin Nalfon Trillsate  Aspirin with Codeine Ecotrin Regular or Extra Strength Naprosyn Uracel  Atromid-S Efficin Naproxen Ursinus  Auranofin Capsules Elmiron Neocylate Vanquish  Axotal Emagrin Norgesic Verin  Azathioprine Empirin or Empirin with Codeine Normiflo Vitamin E  Azolid Emprazil Nuprin Voltaren  Bayer Aspirin plain, buffered or children's or timed BC Tablets or powders Encaprin Orgaran Warfarin Sodium  Buff-a-Comp Enoxaparin Orudis Zorpin  Buff-a-Comp with Codeine Equegesic Os-Cal-Gesic   Buffaprin Excedrin plain, buffered or Extra Strength Oxalid   Bufferin Arthritis Strength Feldene Oxphenbutazone   Bufferin plain or Extra Strength Feldene Capsules Oxycodone with Aspirin   Bufferin with Codeine Fenoprofen Fenoprofen Pabalate or Pabalate-SF   Buffets II Flogesic Panagesic   Buffinol plain or Extra Strength Florinal or Florinal with Codeine Panwarfarin   Buf-Tabs Flurbiprofen Penicillamine   Butalbital Compound Four-way cold tablets Penicillin   Butazolidin Fragmin Pepto-Bismol   Carbenicillin Geminisyn Percodan   Carna Arthritis Reliever Geopen Persantine   Carprofen Gold's salt Persistin   Chloramphenicol Goody's Phenylbutazone   Chloromycetin Haltrain Piroxlcam   Clmetidine heparin Plaquenil   Cllnoril Hyco-pap Ponstel   Clofibrate Hydroxy chloroquine Propoxyphen         Before stopping any of these medications, be sure to consult the physician who ordered them.  Some, such as Coumadin (Warfarin) are ordered to prevent or treat serious conditions such as "deep thrombosis", "pumonary embolisms", and other heart problems.  The amount of time that you may need off of the medication may also vary with the medication and the reason for which you were taking it.  If you are taking any of these medications, please make sure you notify your pain physician before you undergo any  procedures.         Epidural Steroid Injection Patient Information  Description: The epidural space surrounds the nerves as they exit the spinal cord.  In some patients, the nerves can be compressed and inflamed by a bulging disc or a tight spinal canal (spinal stenosis).  By injecting steroids into the epidural space, we can bring irritated nerves into direct contact with a potentially helpful medication.  These steroids act directly on the irritated nerves and can reduce swelling and inflammation which often leads to decreased pain.  Epidural steroids may be injected anywhere along the spine and from the neck to the low back depending upon the location of your pain.   After numbing the skin with local anesthetic (like Novocaine), a small needle is passed   into the epidural space slowly.  You may experience a sensation of pressure while this is being done.  The entire block usually last less than 10 minutes.  Conditions which may be treated by epidural steroids:   Low back and leg pain  Neck and arm pain  Spinal stenosis  Post-laminectomy syndrome  Herpes zoster (shingles) pain  Pain from compression fractures  Preparation for the injection:  1. Do not eat any solid food or dairy products within 8 hours of your appointment.  2. You may drink clear liquids up to 3 hours before appointment.  Clear liquids include water, black coffee, juice or soda.  No milk or cream please. 3. You may take your regular medication, including pain medications, with a sip of water before your appointment  Diabetics should hold regular insulin (if taken separately) and take 1/2 normal NPH dos the morning of the procedure.  Carry some sugar containing items with you to your appointment. 4. A driver must accompany you and be prepared to drive you home after your procedure.  5. Bring all your current medications with your. 6. An IV may be inserted and sedation may be given at the discretion of the  physician.   7. A blood pressure cuff, EKG and other monitors will often be applied during the procedure.  Some patients may need to have extra oxygen administered for a short period. 8. You will be asked to provide medical information, including your allergies, prior to the procedure.  We must know immediately if you are taking blood thinners (like Coumadin/Warfarin)  Or if you are allergic to IV iodine contrast (dye). We must know if you could possible be pregnant.  Possible side-effects:  Bleeding from needle site  Infection (rare, may require surgery)  Nerve injury (rare)  Numbness & tingling (temporary)  Difficulty urinating (rare, temporary)  Spinal headache ( a headache worse with upright posture)  Light -headedness (temporary)  Pain at injection site (several days)  Decreased blood pressure (temporary)  Weakness in arm/leg (temporary)  Pressure sensation in back/neck (temporary)  Call if you experience:  Fever/chills associated with headache or increased back/neck pain.  Headache worsened by an upright position.  New onset weakness or numbness of an extremity below the injection site  Hives or difficulty breathing (go to the emergency room)  Inflammation or drainage at the infection site  Severe back/neck pain  Any new symptoms which are concerning to you  Please note:  Although the local anesthetic injected can often make your back or neck feel good for several hours after the injection, the pain will likely return.  It takes 3-7 days for steroids to work in the epidural space.  You may not notice any pain relief for at least that one week.  If effective, we will often do a series of three injections spaced 3-6 weeks apart to maximally decrease your pain.  After the initial series, we generally will wait several months before considering a repeat injection of the same type.  If you have any questions, please call [phone] [facility]  General Discharge Instructions :  If you need to reach your doctor call: Monday-Friday 8:00 am - 4:00 pm at [phone] or toll free [phone].  After clinic hours [phone] to have operator reach doctor.  Bring all of your medication bottles to all your appointments in the pain clinic.  To cancel or reschedule your appointment with Pain Management please remember to  call 24 hours in advance to avoid a fee.  Refer to the educational materials which you have been given on: General Risks, I had my Procedure. Discharge Instructions, Post Sedation.  Post Procedure Instructions:  The drugs you were given will stay in your system until tomorrow, so for the next 24 hours you should not drive, make any legal decisions or drink any alcoholic beverages.  You may eat anything you prefer, but it is better to start with liquids then soups and crackers, and gradually work up to solid foods.  Please notify your doctor immediately if you have any unusual bleeding, trouble breathing or pain that is not related to your normal pain.  Depending on the type of procedure that was done, some parts of your body may feel week and/or numb.  This usually clears up by tonight or the next day.  Walk with the use of an assistive device or accompanied by an adult for the 24 hours.  You may use ice on the affected area for the first 24 hours.  Put ice in a Ziploc bag and cover with a towel and place against area 15 minutes on 15 minutes off.  You may switch to heat after 24 hours.  Restart plavix tomorrow. Instructed to stop plavix on June 19th for procedure appointment on June 25th.

## 2016-09-24 ENCOUNTER — Telehealth: Payer: Self-pay

## 2016-09-24 NOTE — Telephone Encounter (Signed)
No answer- Left message instructed to call if needed.

## 2016-10-01 NOTE — Progress Notes (Signed)
Cardiology Office Note  Date:  10/02/2016   ID:  Tina Hayes, DOB 19-Aug-1945, MRN [id number]  PCP:  Herminio Commons, MD   Chief Complaint  Patient presents with  . other    3 month follow up. Meds reviewed by the pt. verbally. Pt. c/o shortness of breath with little activity, chest pressure for the past couple of days, hoarseness and LE edema.  Pt. is off plavix since Monday for an epidural injection for her back.     HPI:   71 y.o. 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, 24 to 71 yo  Chronic back pain,   HTN  seizure disorder  hospital admission 06/17/2016 for cough, chest pain, shortness of breath,  stress test showing no ischemia  felt to be atypical in nature, pain at rest, reproducible on palpation who presents  for follow-up of her chest pain, chronic diastolic CHf  moved to [place] shortly after her CABG   Needs knee surgery on the left Takes lasix twice a day with potasium x1 Feels her breathing is stable If she does have shortness of breath feels it could be from her asthma or COPD For COPD has nebs, albuterol and spiriva  Lab work reviewed with her in detail Total chol 99, LDL 40 HBA1C 8.5, (up to 9 recently)  On her last clinic visit had some Chest pain at night when she lays down, sometimes wakes her up at night. Tender when she pushes on her chest, Pain secondary to heavy coughing Nitroglycerin did not help  significant snoring. Previous sleep study  EKG on today's visit shows normal sinus rhythm rate 60 bpm with nonspecific ST abnormality in lead V6, 1 and aVL   PMH:   has a past medical history of Anxiety; CHF (congestive heart failure) ([place]); Chronic back pain; COPD (chronic obstructive pulmonary disease) ([place]); Coronary artery disease; Depression; Diabetes mellitus without complication ([place]); H/O heart artery stent (06/17/2016);  Heart attack ([place]); Hypertension; and Seizures ([place]).  PSH:    Past Surgical History:  Procedure Laterality Date  . ANKLE SURGERY    . CORONARY ARTERY BYPASS GRAFT    . KNEE SURGERY      Current Outpatient Prescriptions  Medication Sig Dispense Refill  . ACCU-CHEK AVIVA PLUS test strip     . albuterol (PROVENTIL HFA;VENTOLIN HFA) 108 (90 Base) MCG/ACT inhaler Inhale 2 puffs into the lungs every 6 (six) hours as needed for wheezing or shortness of breath. 1 Inhaler 2  . amLODipine (NORVASC) 5 MG tablet Take 1 tablet (5 mg total) by mouth daily. 30 tablet 0  . atorvastatin (LIPITOR) 40 MG tablet Take 40 mg by mouth daily at 6 PM.     . Blood Glucose Monitoring Suppl (ACCU-CHEK AVIVA PLUS) w/Device KIT     . carvedilol (COREG) 3.125 MG tablet Take 1 tablet (3.125 mg total) by mouth 2 (two) times daily with a meal. 180 tablet 3  . cetirizine (ZYRTEC) 10 MG tablet Take 10 mg by mouth daily.    . citalopram (CELEXA) 20 MG tablet Take 20 mg by mouth daily.    . clopidogrel (PLAVIX) 75 MG tablet Take 1 tablet (75 mg total) by mouth daily. 30 tablet 0  . cyclobenzaprine (FLEXERIL) 5 MG tablet Take 5 mg by mouth 3 (three) times daily as needed.     [name]  furosemide (LASIX) 40 MG tablet Take 1 tablet (40 mg total) by mouth 2 (two) times daily. 180 tablet 3  . insulin aspart (NOVOLOG) 100 UNIT/ML injection Inject 5 Units into the skin daily. 10 units in the morning 5 units at lunch    . insulin glargine (LANTUS) 100 UNIT/ML injection Inject 0.2-0.3 mLs (20-30 Units total) into the skin 2 (two) times daily. Take 20 units in morning and 30 units at night (Patient taking differently: Inject 20-30 Units into the skin 2 (two) times daily. 40 units at bedtime) 16.8 mL 0  . isosorbide mononitrate (IMDUR) 30 MG 24 hr tablet Take 1 tablet (30 mg total) by mouth 2 (two) times daily. 180 tablet 3  . levETIRAcetam (KEPPRA) 750 MG tablet Take 1 tablet (750 mg total) by mouth 2 (two) times daily. 60 tablet 3  .  losartan (COZAAR) 100 MG tablet Take 1 tablet (100 mg total) by mouth daily. 90 tablet 3  . meloxicam (MOBIC) 7.5 MG tablet Take 1 tablet (7.5 mg total) by mouth daily. 30 tablet 1  . nitroGLYCERIN (NITROSTAT) 0.4 MG SL tablet Place 1 tablet (0.4 mg total) under the tongue every 5 (five) minutes as needed for chest pain. 25 tablet 6  . omeprazole (PRILOSEC) 40 MG capsule Take 1 capsule (40 mg total) by mouth 2 (two) times daily. (Patient taking differently: Take 40 mg by mouth daily. ) 60 capsule 3  . potassium chloride SA (K-DUR,KLOR-CON) 20 MEQ tablet Take 1 tablet (20 mEq total) by mouth daily. 90 tablet 3  . tiotropium (SPIRIVA) 18 MCG inhalation capsule Place 18 mcg into inhaler and inhale daily.    . traMADol (ULTRAM) 50 MG tablet Take 2 tablets (100 mg total) by mouth every 6 (six) hours as needed for moderate pain. 180 tablet 2  . traZODone (DESYREL) 50 MG tablet Take 50 mg by mouth at bedtime.    [name] UNIFINE PENTIPS 31G X 6 MM MISC      No current facility-administered medications for this visit.      Allergies:   Aspirin   Social History:  The patient  reports that she quit smoking about 10 years ago. Her smoking use included Cigarettes. She has a 5.00 pack-year smoking history. She has never used smokeless tobacco. She reports that she does not drink alcohol or use drugs.   Family History:   family history includes Cancer in her sister; Depression in her mother; Diabetes in her brother and mother; Heart attack in her mother and sister; Heart disease in her mother; Heart failure in her mother and sister; Hypertension in her brother, mother, and sister; SIDS in her sister; Stroke in her mother.    Review of Systems: Review of Systems  Constitutional: Positive for malaise/fatigue.  Respiratory: Positive for cough and shortness of breath.   Cardiovascular: Positive for chest pain and leg swelling.  Gastrointestinal: Negative.   Musculoskeletal: Negative.   Neurological: Negative.    Psychiatric/Behavioral: Negative.   All other systems reviewed and are negative.    PHYSICAL EXAM: VS:  BP 105/79 (BP Location: Right Wrist, Patient Position: Sitting, Cuff Size: Normal)   Pulse (!) 59   Ht '5\' 11"'  (1.803 m)   Wt (!) 303 lb (137.4 kg)   BMI 42.26 kg/m  , BMI Body mass index is 42.26 kg/m.  GEN: Well nourished, well developed, in no acute distress, obese, sitting in a wheelchair HEENT: normal  Neck: no JVD, carotid bruits, or masses Cardiac: RRR; no murmurs, rubs,  or gallops, trace nonpitting leg edema  Respiratory:  clear to auscultation bilaterally, normal work of breathing GI: soft, nontender, nondistended, + BS MS: no deformity or atrophy  Skin: warm and dry, no rash Neuro:  Strength and sensation are intact Psych: euthymic mood, full affect    Recent Labs: 06/16/2016: Magnesium 1.8 08/14/2016: ALT 15; BUN 18; Creatinine, Ser 0.69; Hemoglobin 11.8; Platelets 219; Potassium 4.3; Sodium 140    Lipid Panel Lab Results  Component Value Date   CHOL 99 06/17/2016   HDL 46 06/17/2016   LDLCALC 40 06/17/2016   TRIG 64 06/17/2016      Wt Readings from Last 3 Encounters:  10/02/16 (!) 303 lb (137.4 kg)  09/23/16 (!) 307 lb (139.3 kg)  09/17/16 (!) 307 lb (139.3 kg)       ASSESSMENT AND PLAN:   Essential hypertension - Plan: EKG 12-Lead Blood pressure is well controlled on today's visit. No changes made to the medications.  CAD in native artery - Plan: EKG 12-Lead negative stress test, February 2018  Previously with atypical chest pain at rest on palpation  No further workup at this time. No change to her medications  Chronic diastolic congestive heart failure ([place]) - Plan: EKG 12-Lead  on Lasix 40 mg daily with potassium Recommended she moderate her fluid intake No significant ankle swelling   Chest pain  Plan: EKG 12-Lead  no significant pain on today's visit, previously with Atypical in nature, worse on palpation Suspect musculoskeletal  disorder, possibly exacerbated by coughing  Morbid obesity ([place]) - Plan: EKG 12-Lead Recommended low carbohydrate diet, avoid breads Unable to exercise secondary to chronic back pain  Osteoarthritis knee   acceptable risk for knee surgery  Diabetes type 2, poorly controlled Long time spent discussing her diet reserve recommended she decrease her potatoes and bread  Disposition:   F/U  12 months   Total encounter time more than 25 minutes  Greater than 50% was spent in counseling and coordination of care with the patient    Orders Placed This Encounter  Procedures  . EKG 12-Lead     Signed, Esmond Plants, M.D., Ph.D. 10/02/2016  [place], [place]

## 2016-10-02 ENCOUNTER — Ambulatory Visit (INDEPENDENT_AMBULATORY_CARE_PROVIDER_SITE_OTHER): Payer: Medicare HMO | Admitting: Cardiovascular Disease

## 2016-10-02 ENCOUNTER — Encounter: Payer: Self-pay | Admitting: Cardiovascular Disease

## 2016-10-02 VITALS — BP 105/79 | HR 59 | Ht 71.0 in | Wt 303.0 lb

## 2016-10-02 DIAGNOSIS — J432 Centrilobular emphysema: Secondary | ICD-10-CM | POA: Diagnosis not present

## 2016-10-02 DIAGNOSIS — I25118 Atherosclerotic heart disease of native coronary artery with other forms of angina pectoris: Secondary | ICD-10-CM | POA: Diagnosis not present

## 2016-10-02 DIAGNOSIS — E1159 Type 2 diabetes mellitus with other circulatory complications: Secondary | ICD-10-CM

## 2016-10-02 DIAGNOSIS — Z794 Long term (current) use of insulin: Secondary | ICD-10-CM

## 2016-10-02 DIAGNOSIS — I10 Essential (primary) hypertension: Secondary | ICD-10-CM | POA: Diagnosis not present

## 2016-10-02 NOTE — Patient Instructions (Addendum)

## 2016-10-20 ENCOUNTER — Ambulatory Visit: Payer: Medicare HMO | Admitting: Anesthesiology

## 2016-10-24 ENCOUNTER — Ambulatory Visit: Payer: Medicare HMO | Attending: Anesthesiology | Admitting: Anesthesiology

## 2016-10-24 ENCOUNTER — Other Ambulatory Visit: Payer: Self-pay

## 2016-10-24 VITALS — BP 140/69 | HR 62 | Temp 98.0°F | Resp 16 | Ht 71.0 in | Wt 305.0 lb

## 2016-10-24 DIAGNOSIS — Z823 Family history of stroke: Secondary | ICD-10-CM | POA: Diagnosis not present

## 2016-10-24 DIAGNOSIS — R531 Weakness: Secondary | ICD-10-CM | POA: Insufficient documentation

## 2016-10-24 DIAGNOSIS — R569 Unspecified convulsions: Secondary | ICD-10-CM | POA: Diagnosis not present

## 2016-10-24 DIAGNOSIS — M545 Low back pain: Secondary | ICD-10-CM | POA: Insufficient documentation

## 2016-10-24 DIAGNOSIS — M5431 Sciatica, right side: Secondary | ICD-10-CM | POA: Diagnosis not present

## 2016-10-24 DIAGNOSIS — F329 Major depressive disorder, single episode, unspecified: Secondary | ICD-10-CM | POA: Diagnosis not present

## 2016-10-24 DIAGNOSIS — M170 Bilateral primary osteoarthritis of knee: Secondary | ICD-10-CM | POA: Diagnosis not present

## 2016-10-24 DIAGNOSIS — F17210 Nicotine dependence, cigarettes, uncomplicated: Secondary | ICD-10-CM | POA: Insufficient documentation

## 2016-10-24 DIAGNOSIS — I219 Acute myocardial infarction, unspecified: Secondary | ICD-10-CM | POA: Insufficient documentation

## 2016-10-24 DIAGNOSIS — I2510 Atherosclerotic heart disease of native coronary artery without angina pectoris: Secondary | ICD-10-CM | POA: Insufficient documentation

## 2016-10-24 DIAGNOSIS — M79606 Pain in leg, unspecified: Secondary | ICD-10-CM | POA: Insufficient documentation

## 2016-10-24 DIAGNOSIS — Z951 Presence of aortocoronary bypass graft: Secondary | ICD-10-CM | POA: Diagnosis not present

## 2016-10-24 DIAGNOSIS — M5432 Sciatica, left side: Secondary | ICD-10-CM | POA: Insufficient documentation

## 2016-10-24 DIAGNOSIS — I252 Old myocardial infarction: Secondary | ICD-10-CM | POA: Diagnosis not present

## 2016-10-24 DIAGNOSIS — J449 Chronic obstructive pulmonary disease, unspecified: Secondary | ICD-10-CM | POA: Insufficient documentation

## 2016-10-24 DIAGNOSIS — Z955 Presence of coronary angioplasty implant and graft: Secondary | ICD-10-CM | POA: Insufficient documentation

## 2016-10-24 DIAGNOSIS — E1065 Type 1 diabetes mellitus with hyperglycemia: Secondary | ICD-10-CM | POA: Diagnosis not present

## 2016-10-24 DIAGNOSIS — F419 Anxiety disorder, unspecified: Secondary | ICD-10-CM | POA: Diagnosis not present

## 2016-10-24 DIAGNOSIS — Z7902 Long term (current) use of antithrombotics/antiplatelets: Secondary | ICD-10-CM | POA: Diagnosis not present

## 2016-10-24 DIAGNOSIS — Z794 Long term (current) use of insulin: Secondary | ICD-10-CM | POA: Diagnosis not present

## 2016-10-24 DIAGNOSIS — I509 Heart failure, unspecified: Secondary | ICD-10-CM | POA: Insufficient documentation

## 2016-10-24 DIAGNOSIS — I110 Hypertensive heart disease with heart failure: Secondary | ICD-10-CM | POA: Diagnosis not present

## 2016-10-24 DIAGNOSIS — IMO0002 Reserved for concepts with insufficient information to code with codable children: Secondary | ICD-10-CM

## 2016-10-24 DIAGNOSIS — M5136 Other intervertebral disc degeneration, lumbar region: Secondary | ICD-10-CM | POA: Diagnosis not present

## 2016-10-24 DIAGNOSIS — E108 Type 1 diabetes mellitus with unspecified complications: Secondary | ICD-10-CM

## 2016-10-24 LAB — GLUCOSE, RANDOM: GLUCOSE: 40 mg/dL — AB (ref 65–99)

## 2016-10-24 LAB — GLUCOSE, CAPILLARY
Glucose-Capillary: 34 mg/dL — CL (ref 65–99)
Glucose-Capillary: 44 mg/dL — CL (ref 65–99)
Glucose-Capillary: 73 mg/dL (ref 65–99)

## 2016-10-24 MED ORDER — SODIUM CHLORIDE 0.9% FLUSH: 10.0000 mL | Freq: Once | INTRAVENOUS | Status: DC

## 2016-10-24 MED ORDER — LIDOCAINE HCL (PF) 1 % IJ SOLN
INTRAMUSCULAR | Status: AC
  Filled 2016-10-24: qty 5

## 2016-10-24 MED ORDER — ROPIVACAINE HCL 2 MG/ML IJ SOLN
INTRAMUSCULAR | Status: AC
  Filled 2016-10-24: qty 10

## 2016-10-24 MED ORDER — TRIAMCINOLONE ACETONIDE 40 MG/ML IJ SUSP
INTRAMUSCULAR | Status: AC
  Filled 2016-10-24: qty 1

## 2016-10-24 MED ORDER — IOPAMIDOL (ISOVUE-M 200) INJECTION 41%
20.0000 mL | Freq: Once | INTRAMUSCULAR | Status: DC | PRN
Start: 2016-10-24 — End: 2016-10-24

## 2016-10-24 MED ORDER — TRAMADOL HCL 50 MG PO TABS: 100.0000 mg | ORAL_TABLET | Freq: Four times a day (QID) | ORAL | 2 refills | Status: DC | PRN

## 2016-10-24 MED ORDER — LIDOCAINE HCL (PF) 1 % IJ SOLN: 5.0000 mL | Freq: Once | INTRAMUSCULAR | Status: DC

## 2016-10-24 MED ORDER — TRIAMCINOLONE ACETONIDE 40 MG/ML IJ SUSP: 40.0000 mg | Freq: Once | INTRAMUSCULAR | Status: DC

## 2016-10-24 MED ORDER — SODIUM CHLORIDE 0.9 % IJ SOLN
INTRAMUSCULAR | Status: AC
  Filled 2016-10-24: qty 10

## 2016-10-24 MED ORDER — ROPIVACAINE HCL 2 MG/ML IJ SOLN: 10.0000 mL | Freq: Once | INTRAMUSCULAR | Status: DC

## 2016-10-24 MED ORDER — IOPAMIDOL (ISOVUE-M 200) INJECTION 41%
INTRAMUSCULAR | Status: AC
  Filled 2016-10-24: qty 10

## 2016-10-24 MED ORDER — DEXTROSE 50 % IV SOLN
0.5000 | Freq: Once | INTRAVENOUS | Status: DC
Start: 2016-10-24 — End: 2016-10-24
  Filled 2016-10-24: qty 50
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
## 2016-10-24 NOTE — Patient Instructions (Signed)
Pain Management Discharge Instructions  General Discharge Instructions :  If you need to reach your doctor call: Monday-Friday 8:00 am - 4:00 pm at [phone] or toll free [phone].  After clinic hours [phone] to have operator reach doctor.  Bring all of your medication bottles to all your appointments in the pain clinic.  To cancel or reschedule your appointment with Pain Management please remember to call 24 hours in advance to avoid a fee.  Refer to the educational materials which you have been given on: General Risks, I had my Procedure. Discharge Instructions, Post Sedation.  Post Procedure Instructions:  The drugs you were given will stay in your system until tomorrow, so for the next 24 hours you should not drive, make any legal decisions or drink any alcoholic beverages.  You may eat anything you prefer, but it is better to start with liquids then soups and crackers, and gradually work up to solid foods.  Please notify your doctor immediately if you have any unusual bleeding, trouble breathing or pain that is not related to your normal pain.  Depending on the type of procedure that was done, some parts of your body may feel week and/or numb.  This usually clears up by tonight or the next day.  Walk with the use of an assistive device or accompanied by an adult for the 24 hours.  You may use ice on the affected area for the first 24 hours.  Put ice in a Ziploc bag and cover with a towel and place against area 15 minutes on 15 minutes off.  You may switch to heat after 24 hours.Epidural Steroid Injection An epidural steroid injection is a shot of steroid medicine and numbing medicine that is given into the space between the spinal cord and the bones in your back (epidural space). The shot helps relieve pain caused by an irritated or swollen nerve root. The amount of pain relief you get from the injection depends on what is causing the nerve to be swollen and irritated,  and how long your pain lasts. You are more likely to benefit from this injection if your pain is strong and comes on suddenly rather than if you have had pain for a long time. Tell a health care provider about:  Any allergies you have.  All medicines you are taking, including vitamins, herbs, eye drops, creams, and over-the-counter medicines.  Any problems you or family members have had with anesthetic medicines.  Any blood disorders you have.  Any surgeries you have had.  Any medical conditions you have.  Whether you are pregnant or may be pregnant. What are the risks? Generally, this is a safe procedure. However, problems may occur, including:  Headache.  Bleeding.  Infection.  Allergic reaction to medicines.  Damage to your nerves.  What happens before the procedure? Staying hydrated Follow instructions from your health care provider about hydration, which may include:  Up to 2 hours before the procedure - you may continue to drink clear liquids, such as water, clear fruit juice, black coffee, and plain tea.  Eating and drinking restrictions Follow instructions from your health care provider about eating and drinking, which may include:  8 hours before the procedure - stop eating heavy meals or foods such as meat, fried foods, or fatty foods.  6 hours before the procedure - stop eating light meals or foods, such as toast or cereal.  6 hours before the procedure - stop drinking milk or drinks that contain milk.    2 hours before the procedure - stop drinking clear liquids.  Medicine  You may be given medicines to lower anxiety.  Ask your health care provider about: ? Changing or stopping your regular medicines. This is especially important if you are taking diabetes medicines or blood thinners. ? Taking medicines such as aspirin and ibuprofen. These medicines can thin your blood. Do not take these medicines before your procedure if your health care provider  instructs you not to. General instructions  Plan to have someone take you home from the hospital or clinic. What happens during the procedure?  You may receive a medicine to help you relax (sedative).  You will be asked to lie on your abdomen.  The injection site will be cleaned.  A numbing medicine (local anesthetic) will be used to numb the injection site.  A needle will be inserted through your skin into the epidural space. You may feel some discomfort when this happens. An X-ray machine will be used to make sure the needle is put as close as possible to the affected nerve.  A steroid medicine and a local anesthetic will be injected into the epidural space.  The needle will be removed.  A bandage (dressing) will be put over the injection site. What happens after the procedure?  Your blood pressure, heart rate, breathing rate, and blood oxygen level will be monitored until the medicines you were given have worn off.  Your arm or leg may feel weak or numb for a few hours.  The injection site may feel sore.  Do not drive for 24 hours if you received a sedative. This information is not intended to replace advice given to you by your health care provider. Make sure you discuss any questions you have with your health care provider. Document Released: 07/22/2007 Document Revised: 09/26/2015 Document Reviewed: 07/31/2015 Elsevier Interactive Patient Education  2017 [place]  What are the risk, side effects and possible complications? Generally speaking, most procedures are safe.  However, with any procedure there are risks, side effects, and the possibility of complications.  The risks and complications are dependent upon the sites that are lesioned, or the type of nerve block to be performed.  The closer the procedure is to the spine, the more serious the risks are.  Great care is taken when placing the radio frequency needles, block needles or  lesioning probes, but sometimes complications can occur. 1. Infection: Any time there is an injection through the skin, there is a risk of infection.  This is why sterile conditions are used for these blocks.  There are four possible types of infection. 1. Localized skin infection. 2. Central Nervous System Infection-This can be in the form of Meningitis, which can be deadly. 3. Epidural Infections-This can be in the form of an epidural abscess, which can cause pressure inside of the spine, causing compression of the spinal cord with subsequent paralysis. This would require an emergency surgery to decompress, and there are no guarantees that the patient would recover from the paralysis. 4. Discitis-This is an infection of the intervertebral discs.  It occurs in about 1% of discography procedures.  It is difficult to treat and it may lead to surgery.        2. Pain: the needles have to go through skin and soft tissues, will cause soreness.       3. Damage to internal structures:  The nerves to be lesioned may be near blood vessels or  other nerves which can be potentially damaged.       4. Bleeding: Bleeding is more common if the patient is taking blood thinners such as  aspirin, Coumadin, Ticiid, Plavix, etc., or if he/she have some genetic predisposition  such as hemophilia. Bleeding into the spinal canal can cause compression of the spinal  cord with subsequent paralysis.  This would require an emergency surgery to  decompress and there are no guarantees that the patient would recover from the  paralysis.       5. Pneumothorax:  Puncturing of a lung is a possibility, every time a needle is introduced in  the area of the chest or upper back.  Pneumothorax refers to free air around the  collapsed lung(s), inside of the thoracic cavity (chest cavity).  Another two possible  complications related to a similar event would include: Hemothorax and Chylothorax.   These are variations of the Pneumothorax,  where instead of air around the collapsed  lung(s), you may have blood or chyle, respectively.       6. Spinal headaches: They may occur with any procedures in the area of the spine.       7. Persistent CSF (Cerebro-Spinal Fluid) leakage: This is a rare problem, but may occur  with prolonged intrathecal or epidural catheters either due to the formation of a fistulous  track or a dural tear.       8. Nerve damage: By working so close to the spinal cord, there is always a possibility of  nerve damage, which could be as serious as a permanent spinal cord injury with  paralysis.       9. Death:  Although rare, severe deadly allergic reactions known as "Anaphylactic  reaction" can occur to any of the medications used.      10. Worsening of the symptoms:  We can always make thing worse.  What are the chances of something like this happening? Chances of any of this occuring are extremely low.  By statistics, you have more of a chance of getting killed in a motor vehicle accident: while driving to the hospital than any of the above occurring .  Nevertheless, you should be aware that they are possibilities.  In general, it is similar to taking a shower.  Everybody knows that you can slip, hit your head and get killed.  Does that mean that you should not shower again?  Nevertheless always keep in mind that statistics do not mean anything if you happen to be on the wrong side of them.  Even if a procedure has a 1 (one) in a 1,000,000 (million) chance of going wrong, it you happen to be that one..Also, keep in mind that by statistics, you have more of a chance of having something go wrong when taking medications.  Who should not have this procedure? If you are on a blood thinning medication (e.g. Coumadin, Plavix, see list of "Blood Thinners"), or if you have an active infection going on, you should not have the procedure.  If you are taking any blood thinners, please inform your physician.  How should I prepare  for this procedure?  Do not eat or drink anything at least six hours prior to the procedure.  Bring a driver with you .  It cannot be a taxi.  Come accompanied by an adult that can drive you back, and that is strong enough to help you if your legs get weak or numb from the local anesthetic.  Take  all of your medicines the morning of the procedure with just enough water to swallow them.  If you have diabetes, make sure that you are scheduled to have your procedure done first thing in the morning, whenever possible.  If you have diabetes, take only half of your insulin dose and notify our nurse that you have done so as soon as you arrive at the clinic.  If you are diabetic, but only take blood sugar pills (oral hypoglycemic), then do not take them on the morning of your procedure.  You may take them after you have had the procedure.  Do not take aspirin or any aspirin-containing medications, at least eleven (11) days prior to the procedure.  They may prolong bleeding.  Wear loose fitting clothing that may be easy to take off and that you would not mind if it got stained with Betadine or blood.  Do not wear any jewelry or perfume  Remove any nail coloring.  It will interfere with some of our monitoring equipment.  NOTE: Remember that this is not meant to be interpreted as a complete list of all possible complications.  Unforeseen problems may occur.  BLOOD THINNERS The following drugs contain aspirin or other products, which can cause increased bleeding during surgery and should not be taken for 2 weeks prior to and 1 week after surgery.  If you should need take something for relief of minor pain, you may take acetaminophen which is found in Tylenol,m Datril, Anacin-3 and Panadol. It is not blood thinner. The products listed below are.  Do not take any of the products listed below in addition to any listed on your instruction sheet.  A.P.C or A.P.C with Codeine Codeine Phosphate Capsules #3  Ibuprofen Ridaura  ABC compound Congesprin Imuran rimadil  Advil Cope Indocin Robaxisal  Alka-Seltzer Effervescent Pain Reliever and Antacid Coricidin or Coricidin-D  Indomethacin Rufen  Alka-Seltzer plus Cold Medicine Cosprin Ketoprofen S-A-C Tablets  Anacin Analgesic Tablets or Capsules Coumadin Korlgesic Salflex  Anacin Extra Strength Analgesic tablets or capsules CP-2 Tablets Lanoril Salicylate  Anaprox Cuprimine Capsules Levenox Salocol  Anexsia-D Dalteparin Magan Salsalate  Anodynos Darvon compound Magnesium Salicylate Sine-off  Ansaid Dasin Capsules Magsal Sodium Salicylate  Anturane Depen Capsules Marnal Soma  APF Arthritis pain formula Dewitt's Pills Measurin Stanback  Argesic Dia-Gesic Meclofenamic Sulfinpyrazone  Arthritis Bayer Timed Release Aspirin Diclofenac Meclomen Sulindac  Arthritis pain formula Anacin Dicumarol Medipren Supac  Analgesic (Safety coated) Arthralgen Diffunasal Mefanamic Suprofen  Arthritis Strength Bufferin Dihydrocodeine Mepro Compound Suprol  Arthropan liquid Dopirydamole Methcarbomol with Aspirin Synalgos  ASA tablets/Enseals Disalcid Micrainin Tagament  Ascriptin Doan's Midol Talwin  Ascriptin A/D Dolene Mobidin Tanderil  Ascriptin Extra Strength Dolobid Moblgesic Ticlid  Ascriptin with Codeine Doloprin or Doloprin with Codeine Momentum Tolectin  Asperbuf Duoprin Mono-gesic Trendar  Aspergum Duradyne Motrin or Motrin IB Triminicin  Aspirin plain, buffered or enteric coated Durasal Myochrisine Trigesic  Aspirin Suppositories Easprin Nalfon Trillsate  Aspirin with Codeine Ecotrin Regular or Extra Strength Naprosyn Uracel  Atromid-S Efficin Naproxen Ursinus  Auranofin Capsules Elmiron Neocylate Vanquish  Axotal Emagrin Norgesic Verin  Azathioprine Empirin or Empirin with Codeine Normiflo Vitamin E  Azolid Emprazil Nuprin Voltaren  Bayer Aspirin plain, buffered or children's or timed BC Tablets or powders Encaprin Orgaran Warfarin Sodium    Buff-a-Comp Enoxaparin Orudis Zorpin  Buff-a-Comp with Codeine Equegesic Os-Cal-Gesic   Buffaprin Excedrin plain, buffered or Extra Strength Oxalid   Bufferin Arthritis Strength Feldene Oxphenbutazone   Bufferin plain or Extra Strength Feldene  Capsules Oxycodone with Aspirin   Bufferin with Codeine Fenoprofen Fenoprofen Pabalate or Pabalate-SF   Buffets II Flogesic Panagesic   Buffinol plain or Extra Strength Florinal or Florinal with Codeine Panwarfarin   Buf-Tabs Flurbiprofen Penicillamine   Butalbital Compound Four-way cold tablets Penicillin   Butazolidin Fragmin Pepto-Bismol   Carbenicillin Geminisyn Percodan   Carna Arthritis Reliever Geopen Persantine   Carprofen Gold's salt Persistin   Chloramphenicol Goody's Phenylbutazone   Chloromycetin Haltrain Piroxlcam   Clmetidine heparin Plaquenil   Cllnoril Hyco-pap Ponstel   Clofibrate Hydroxy chloroquine Propoxyphen         Before stopping any of these medications, be sure to consult the physician who ordered them.  Some, such as Coumadin (Warfarin) are ordered to prevent or treat serious conditions such as "deep thrombosis", "pumonary embolisms", and other heart problems.  The amount of time that you may need off of the medication may also vary with the medication and the reason for which you were taking it.  If you are taking any of these medications, please make sure you notify your pain physician before you undergo any procedures.

## 2016-10-24 NOTE — Progress Notes (Signed)
Nursing Pain Medication Assessment:  Safety precautions to be maintained throughout the outpatient stay will include: orient to surroundings, keep bed in low position, maintain call bell within reach at all times, provide assistance with transfer out of bed and ambulation.  Medication Inspection Compliance: Pill count conducted under aseptic conditions, in front of the patient. Neither the pills nor the bottle was removed from the patient's sight at any time. Once count was completed pills were immediately returned to the patient in their original bottle.  Medication: Tramadol (Ultram) Pill/Patch Count: 12 of 180 pills remain Pill/Patch Appearance: Markings consistent with prescribed medication Bottle Appearance: Standard pharmacy container. Clearly labeled. Filled Date: 05 / 31 / 2018 Last Medication intake:  Day before yesterday

## 2016-11-13 ENCOUNTER — Emergency Department: Payer: Medicare HMO

## 2016-11-13 ENCOUNTER — Emergency Department
Admission: EM | Admit: 2016-11-13 | Discharge: 2016-11-13 | Disposition: A | Discharge status: Home / Self Care | Payer: Medicare HMO | Attending: Emergency Medicine | Admitting: Emergency Medicine

## 2016-11-13 DIAGNOSIS — M79602 Pain in left arm: Secondary | ICD-10-CM | POA: Insufficient documentation

## 2016-11-13 DIAGNOSIS — I2510 Atherosclerotic heart disease of native coronary artery without angina pectoris: Secondary | ICD-10-CM | POA: Insufficient documentation

## 2016-11-13 DIAGNOSIS — Z794 Long term (current) use of insulin: Secondary | ICD-10-CM | POA: Diagnosis not present

## 2016-11-13 DIAGNOSIS — Z8679 Personal history of other diseases of the circulatory system: Secondary | ICD-10-CM | POA: Diagnosis not present

## 2016-11-13 DIAGNOSIS — E119 Type 2 diabetes mellitus without complications: Secondary | ICD-10-CM | POA: Diagnosis not present

## 2016-11-13 DIAGNOSIS — Z87891 Personal history of nicotine dependence: Secondary | ICD-10-CM | POA: Diagnosis not present

## 2016-11-13 DIAGNOSIS — J449 Chronic obstructive pulmonary disease, unspecified: Secondary | ICD-10-CM | POA: Diagnosis not present

## 2016-11-13 DIAGNOSIS — Z79899 Other long term (current) drug therapy: Secondary | ICD-10-CM | POA: Insufficient documentation

## 2016-11-13 DIAGNOSIS — Z951 Presence of aortocoronary bypass graft: Secondary | ICD-10-CM | POA: Insufficient documentation

## 2016-11-13 DIAGNOSIS — I509 Heart failure, unspecified: Secondary | ICD-10-CM | POA: Diagnosis not present

## 2016-11-13 DIAGNOSIS — I110 Hypertensive heart disease with heart failure: Secondary | ICD-10-CM | POA: Diagnosis not present

## 2016-11-13 DIAGNOSIS — M7989 Other specified soft tissue disorders: Secondary | ICD-10-CM

## 2016-11-13 LAB — BASIC METABOLIC PANEL
Anion gap: 7 (ref 5–15)
BUN: 18 mg/dL (ref 6–20)
CHLORIDE: 105 mmol/L (ref 101–111)
CO2: 28 mmol/L (ref 22–32)
CREATININE: 0.75 mg/dL (ref 0.44–1.00)
Calcium: 9.4 mg/dL (ref 8.9–10.3)
GFR calc Af Amer: >60 mL/min (ref >60)
GLUCOSE: 120 mg/dL — AB (ref 65–99)
Potassium: 4 mmol/L (ref 3.5–5.1)
SODIUM: 140 mmol/L (ref 135–145)

## 2016-11-13 LAB — CBC
HEMATOCRIT: 34 % — AB (ref 35.0–47.0)
Hemoglobin: 11.1 g/dL — ABNORMAL LOW (ref 12.0–16.0)
MCH: 27.2 pg (ref 26.0–34.0)
MCHC: 32.7 g/dL (ref 32.0–36.0)
MCV: 83.1 fL (ref 80.0–100.0)
Platelets: 236 10*3/uL (ref 150–440)
RBC: 4.1 MIL/uL (ref 3.80–5.20)
RDW: 15.1 % — ABNORMAL HIGH (ref 11.5–14.5)
WBC: 5.1 10*3/uL (ref 3.6–11.0)

## 2016-11-13 LAB — PROTIME-INR
INR: 0.99
PROTHROMBIN TIME: 13.1 s (ref 11.4–15.2)

## 2016-11-13 LAB — FIBRIN DERIVATIVES D-DIMER (ARMC ONLY): Fibrin derivatives D-dimer (ARMC): <45 (ref 0.00–499.00)

## 2016-11-13 LAB — APTT: APTT: 30 s (ref 24–36)

## 2016-11-13 LAB — TROPONIN I

## 2016-11-13 NOTE — ED Triage Notes (Signed)
Pt reports to ED w/ c/o L arm pain and yesterday x 2 days. Pt denies injury to area. Pt A/OX4, resp even and unlabored.  Pt sts that she stopped taking Plavix today in prep for epidural 7/26. Denies other medical complaints. NAD.

## 2016-11-13 NOTE — ED Notes (Signed)
Ems pt to lobby via wheelchair , left arm pain with swelling distal from elbow ,, decreased ROM , walks with a walker

## 2016-11-13 NOTE — ED Provider Notes (Signed)
[facility] Emergency Department Provider Note  ____________________________________________  Time seen: Approximately 3:44 PM  I have reviewed the triage vital signs and the nursing notes.   HISTORY  Chief Complaint Arm Injury    HPI Tina Hayes is a 71 y.o. female who presents emergency department complaining of left upper extremity pain and swelling. Patient reports that 2 days prior to the visit she stopped her Plavix for a surgical procedure that is to occur in one week from today. Patient reports that later on in the evening she developed left upper arm pain. This is primarily located medial aspect of the proximal aspect of the left upper extremity. Patient has experienced no trauma. She denies any numbness or tingling in the left hand or digits. Patient has been guarding left arm due to pain. Patient has a significant medical history of CHF, COPD, CAD, MI, stent placement, diabetes, hypertension. She denies any history of DVT. Patient denies any headache, visual changes, neck pain, chest pain, shortness of breath, palpitations, abdominal pain, nausea or vomiting.   Past Medical History:  Diagnosis Date  . Anxiety   . CHF (congestive heart failure) ([place])   . Chronic back pain   . COPD (chronic obstructive pulmonary disease) ([place])   . Coronary artery disease   . Depression   . Diabetes mellitus without complication ([place])   . H/O heart artery stent 06/17/2016   5 stents per patient  . Heart attack ([place])    Total of 3 per pt.  . Hypertension   . Seizures [facility])     Patient Active Problem List   Diagnosis Date Noted  . Essential hypertension 06/30/2016  . Snoring 06/30/2016  . Diabetes mellitus ([place]) 06/30/2016  . CHF (congestive heart failure) ([place]) 06/18/2016  . Coronary artery disease of native artery of native heart with stable angina pectoris ([place]) 06/17/2016  . Morbid obesity ([place]) 06/17/2016  . Chest pain with moderate risk for cardiac  etiology 06/16/2016  . COPD (chronic obstructive pulmonary disease) ([place]) 08/28/2015  . Hypertensive urgency 06/03/2015    Past Surgical History:  Procedure Laterality Date  . ANKLE SURGERY    . CORONARY ARTERY BYPASS GRAFT    . KNEE SURGERY      Prior to Admission medications   Medication Sig Start Date End Date Taking? Authorizing Provider  ACCU-CHEK AVIVA PLUS test strip  05/19/16   [provider]  albuterol (PROVENTIL HFA;VENTOLIN HFA) 108 (90 Base) MCG/ACT inhaler Inhale 2 puffs into the lungs every 6 (six) hours as needed for wheezing or shortness of breath. 08/30/15   Epifanio Lesches, MD  amLODipine (NORVASC) 5 MG tablet Take 1 tablet (5 mg total) by mouth daily. 08/14/16   Minna Merritts, MD  atorvastatin (LIPITOR) 40 MG tablet Take 40 mg by mouth daily at 6 PM.  07/10/16   [provider]  Blood Glucose Monitoring Suppl (ACCU-CHEK AVIVA PLUS) w/Device KIT  06/13/16   [provider]  carvedilol (COREG) 3.125 MG tablet Take 1 tablet (3.125 mg total) by mouth 2 (two) times daily with a meal. 08/12/16   Alisa Graff, FNP  cetirizine (ZYRTEC) 10 MG tablet Take 10 mg by mouth daily.    [provider]  citalopram (CELEXA) 20 MG tablet Take 20 mg by mouth daily.    [provider]  clopidogrel (PLAVIX) 75 MG tablet Take 1 tablet (75 mg total) by mouth daily. 08/14/16   Minna Merritts, MD  cyclobenzaprine (FLEXERIL) 5 MG tablet  Take 5 mg by mouth 3 (three) times daily as needed.  09/26/16   [provider]  furosemide (LASIX) 40 MG tablet Take 1 tablet (40 mg total) by mouth 2 (two) times daily. 08/12/16   Alisa Graff, FNP  insulin aspart (NOVOLOG) 100 UNIT/ML injection Inject 5 Units into the skin daily. 10 units in the morning 5 units at lunch    [provider]  insulin glargine (LANTUS) 100 UNIT/ML injection Inject 0.2-0.3 mLs (20-30 Units total) into the skin 2 (two) times daily. Take 20 units in morning and 30  units at night Patient taking differently: Inject 20-30 Units into the skin 2 (two) times daily. 40 units at bedtime 06/18/16 10/02/16  Bettey Costa, MD  isosorbide mononitrate (IMDUR) 30 MG 24 hr tablet Take 1 tablet (30 mg total) by mouth 2 (two) times daily. 08/12/16   Alisa Graff, FNP  levETIRAcetam (KEPPRA) 750 MG tablet Take 1 tablet (750 mg total) by mouth 2 (two) times daily. 06/05/15   Rai, Vernelle Emerald, MD  losartan (COZAAR) 100 MG tablet Take 1 tablet (100 mg total) by mouth daily. 08/12/16   Alisa Graff, FNP  meloxicam (MOBIC) 7.5 MG tablet Take 1 tablet (7.5 mg total) by mouth daily. 06/05/15   Rai, Vernelle Emerald, MD  nitroGLYCERIN (NITROSTAT) 0.4 MG SL tablet Place 1 tablet (0.4 mg total) under the tongue every 5 (five) minutes as needed for chest pain. 07/01/16 07/01/17  Minna Merritts, MD  omeprazole (PRILOSEC) 40 MG capsule Take 1 capsule (40 mg total) by mouth 2 (two) times daily. Patient taking differently: Take 40 mg by mouth daily.  06/05/15   Rai, Vernelle Emerald, MD  potassium chloride SA (K-DUR,KLOR-CON) 20 MEQ tablet Take 1 tablet (20 mEq total) by mouth daily. 08/12/16   Alisa Graff, FNP  tiotropium (SPIRIVA) 18 MCG inhalation capsule Place 18 mcg into inhaler and inhale daily.    [provider]  traMADol (ULTRAM) 50 MG tablet Take 2 tablets (100 mg total) by mouth every 6 (six) hours as needed for moderate pain. 10/24/16   Molli Barrows, MD  traZODone (DESYREL) 50 MG tablet Take 50 mg by mouth at bedtime.    [provider]  UNIFINE PENTIPS 31G X 6 MM [place]  07/23/16   [provider]    Allergies Aspirin  Family History  Problem Relation Age of Onset  . Diabetes Unknown   . Hypertension Unknown   . Diabetes Mother   . Heart failure Mother   . Heart disease Mother   . Heart attack Mother   . Stroke Mother   . Depression Mother   . Hypertension Mother   . Cancer Sister        brain  . Hypertension Sister   . Diabetes Brother   . Hypertension  Brother   . Heart failure Sister   . Heart attack Sister   . SIDS Sister     Social History Social History  Substance Use Topics  . Smoking status: Former Smoker    Packs/day: 0.25    Years: 20.00    Types: Cigarettes    Quit date: 07/01/2006  . Smokeless tobacco: Never Used  . Alcohol use No     Review of Systems  Constitutional: No fever/chills Eyes: No visual changes.  Cardiovascular: no chest pain. Respiratory: no cough. No SOB. Gastrointestinal: No abdominal pain.  No nausea, no vomiting.  No diarrhea.  No constipation. Musculoskeletal: Positive for left upper  arm pain. Skin: Negative for rash, abrasions, lacerations, ecchymosis. Neurological: Negative for headaches, focal weakness or numbness. 10-point ROS otherwise negative.  ____________________________________________   PHYSICAL EXAM:  VITAL SIGNS: ED Triage Vitals  Enc Vitals Group     BP 11/13/16 1530 (!) 157/85     Pulse Rate 11/13/16 1530 60     Resp 11/13/16 1530 20     Temp 11/13/16 1530 98.4 F (36.9 C)     Temp Source 11/13/16 1530 Oral     SpO2 11/13/16 1530 100 %     Weight 11/13/16 1530 (!) 307 lb (139.3 kg)     Height 11/13/16 1530 '5\' 11"'  (1.803 m)     Head Circumference --      Peak Flow --      Pain Score 11/13/16 1529 8     Pain Loc --      Pain Edu? --      Excl. in [place]? --      Constitutional: Alert and oriented. Well appearing and in no acute distress. Eyes: Conjunctivae are normal. PERRL. EOMI. Head: Atraumatic. ENT:      Ears:       Nose: No congestion/rhinnorhea.      Mouth/Throat: Mucous membranes are moist.  Neck: No stridor.  No midline or paraspinal cervical spine tenderness to palpation. Neck is supple with full range of motion.  Cardiovascular: Normal rate, regular rhythm. Normal S1 and S2.  Good peripheral circulation. Radial pulses intact and equal to the unaffected extremity. Cap refill intact all 5 digits left upper extremity and compatible with unaffected  extremity. Respiratory: Normal respiratory effort without tachypnea or retractions. Lungs CTAB. Good air entry to the bases with no decreased or absent breath sounds. Musculoskeletal: Limited range of motion to left upper extremity due to pain. On visualization, proximal left upper extremity is slightly edematous when compared with unaffected extremity. No significant erythema. No skin lesions consistent with cellulitis. Patient is very tender to palpation medial arm just proximal to elbow to axilla. No palpable abnormality or palpable cords. Pulses and sensation are intact distally. Neurologic:  Normal speech and language. No gross focal neurologic deficits are appreciated.  Skin:  Skin is warm, dry and intact. No rash noted. Psychiatric: Mood and affect are normal. Speech and behavior are normal. Patient exhibits appropriate insight and judgement.   ____________________________________________   LABS (all labs ordered are listed, but only abnormal results are displayed)  Labs Reviewed  BASIC METABOLIC PANEL - Abnormal; Notable for the following:       Result Value   Glucose, Bld 120 (*)    All other components within normal limits  CBC - Abnormal; Notable for the following:    Hemoglobin 11.1 (*)    HCT 34.0 (*)    RDW 15.1 (*)    All other components within normal limits  TROPONIN I  APTT  FIBRIN DERIVATIVES D-DIMER ([facility] ONLY)  PROTIME-INR   ____________________________________________  EKG  EKG reveals normal sinus rhythm at a rate of 60 bpm. Occasional PVCs are appreciated. LVH. PR, QRS, QT interval within normal limits. Normal axis. No ST elevation or depression noted. No Q waves are delta waves identified.   ____________________________________________  RADIOLOGY Diamantina Providence Mi Balla, personally  evaluated the written report by the radiologist.  Dg Chest 2 View  Result Date: 11/13/2016 CLINICAL DATA:  states she developed left upper arm pain about 2-3 days  ago.Pain is in entire humerus; she thinks she may have been stung by something  Positive swelling to upper arm Increased pain with movement.--No known injuryCHF, COPD, CAD, DM, h/o MI (x 3), HTN, former smoker x 10 years agoNo previous injury to left humerus EXAM: CHEST  2 VIEW COMPARISON:  06/16/2016 FINDINGS: Changes from CABG surgery are stable. The cardiac silhouette is normal in size and configuration. Small to moderate hiatal hernia. No mediastinal or hilar masses or convincing adenopathy. Mild linear scarring or atelectasis in the left lower lung, stable. Lungs otherwise clear. No pleural effusion or pneumothorax. Skeletal structures are demineralized but grossly intact. IMPRESSION: No acute cardiopulmonary disease. Electronically Signed   By: Lajean Manes M.D.   On: 11/13/2016 16:49   US Venous Img Upper Uni Left  Result Date: 11/13/2016 CLINICAL DATA:  Left arm pain and swelling. EXAM: LEFT UPPER EXTREMITY VENOUS DOPPLER ULTRASOUND TECHNIQUE: Gray-scale sonography with graded compression, as well as color Doppler and duplex ultrasound were performed to evaluate the upper extremity deep venous system from the level of the subclavian vein and including the jugular, axillary, basilic, radial, ulnar and upper cephalic vein. Spectral Doppler was utilized to evaluate flow at rest and with distal augmentation maneuvers. COMPARISON:  None. FINDINGS: Contralateral Subclavian Vein: Respiratory phasicity is normal and symmetric with the symptomatic side. No evidence of thrombus. Internal Jugular Vein: No evidence of thrombus. Normal compressibility, respiratory phasicity and response to augmentation. Subclavian Vein: No evidence of thrombus. Normal respiratory phasicity and response to augmentation. Axillary Vein: No evidence of thrombus. Normal compressibility, respiratory phasicity and response to augmentation. Cephalic Vein: No evidence of thrombus. Normal compressibility, respiratory phasicity and response to  augmentation. Basilic Vein: No evidence of thrombus. Normal compressibility, respiratory phasicity and response to augmentation. Brachial Veins: No evidence of thrombus. Normal compressibility, respiratory phasicity and response to augmentation. Radial Veins: No evidence of thrombus. Normal compressibility, respiratory phasicity and response to augmentation. Ulnar Veins: No evidence of thrombus. Normal compressibility, respiratory phasicity and response to augmentation. Venous Reflux:  None visualized. Other Findings:  None visualized. IMPRESSION: No evidence of DVT within the  upper extremity. Electronically Signed   By: Misty Stanley M.D.   On: 11/13/2016 17:09   Dg Humerus Left  Result Date: 11/13/2016 CLINICAL DATA:  states she developed left upper arm pain about 2-3 days ago.Pain is in entire humerus; she thinks she may have been stung by something Positive swelling to upper arm Increased pain with movement.--No known injuryCHF, COPD, CAD, DM, h/o MI (x 3), HTN, former smoker x 10 years agoNo previous injury to left humerus EXAM: LEFT HUMERUS - 2+ VIEW COMPARISON:  None. FINDINGS: No fracture.  No bone lesion. The glenohumeral and elbow joints are normally spaced and aligned. Bones are demineralized. Soft tissues are unremarkable. IMPRESSION: No fracture, bone lesion or significant joint abnormality. Electronically Signed   By: Lajean Manes M.D.   On: 11/13/2016 16:50    ____________________________________________    PROCEDURES  Procedure(s) performed:    Procedures    Medications - No data to display   ____________________________________________   INITIAL IMPRESSION / ASSESSMENT AND PLAN / ED COURSE  Pertinent labs & imaging results that were available during my care of the patient were reviewed by me and considered in my medical decision making (see chart for details).  Review of the [place] CSRS was performed in accordance of the [place] prior to dispensing any controlled drugs.      Patient's diagnosis is consistent with left arm pain. Patient presents emergency department complaining of medial left proximal upper arm pain. Patient had  suffered no trauma and there was no visible deformity. There was mild edema of the left upper extremity compared with the right upper extremity. No erythema. No wounds. No skin findings consistent with cellulitis or infection. Patient has significant medical history including stopping her blood thinner for surgical procedure to occur in one week. As such, patient had labs, imaging, EKG performed which returned with no indication of STEMI. Cardiac labs unremarkable.. Labs returned with reassuring results. Imaging returns with no acute findings, osseous or vascular. At this time, no clear indication of pain. Exam was reassuring with patient began neurologically and vascularly intact. Again, no signs of infection, wounds, or injuries. Patient is given sling for comfort. She will follow up with primary care if pain continues or increases.. No new medications prescribed at this time. Patient is given ED precautions to return to the ED for any worsening or new symptoms.     ____________________________________________  FINAL CLINICAL IMPRESSION(S) / ED DIAGNOSES  Final diagnoses:  Pain of left upper extremity      NEW MEDICATIONS STARTED DURING THIS VISIT:  New Prescriptions   No medications on file        This chart was dictated using voice recognition software/Dragon. Despite best efforts to proofread, errors can occur which can change the meaning. Any change was purely unintentional.    Darletta Moll, PA-C 11/13/16 1812    Lisa Roca, MD 11/13/16 2118

## 2016-11-13 NOTE — ED Provider Notes (Signed)
ED ECG REPORT I, Doran Stabler, the attending physician, personally viewed and interpreted this ECG.   Date: 11/13/2016  EKG Time: 1607  Rate: 60  Rhythm: normal sinus rhythm with PVC times 2  Axis: Normal  Intervals:none  ST&T Change: T wave inversions in 1, aVL as well as V2. No ST segment elevation or depression. No significant change from previous of 10/02/2016.    Orbie Pyo, MD 11/13/16 [phone]

## 2016-11-13 NOTE — ED Notes (Signed)
See triage note  states she developed left upper arm pain about 2-3 days ago  Unsure of injury thinks she may have been stung by something  Positive swelling to upper arm  Increased pain with movement  Good pulses states she stopped taking her plavix to get ready for epidural

## 2016-11-18 ENCOUNTER — Ambulatory Visit (HOSPITAL_BASED_OUTPATIENT_CLINIC_OR_DEPARTMENT_OTHER): Payer: Medicare HMO | Admitting: Anesthesiology

## 2016-11-18 ENCOUNTER — Other Ambulatory Visit: Payer: Self-pay | Admitting: Anesthesiology

## 2016-11-18 ENCOUNTER — Encounter: Payer: Self-pay | Admitting: Anesthesiology

## 2016-11-18 ENCOUNTER — Ambulatory Visit
Admission: RE | Admit: 2016-11-18 | Discharge: 2016-11-18 | Disposition: A | Discharge status: Home / Self Care | Payer: Medicare HMO | Source: Ambulatory Visit | Attending: Anesthesiology | Admitting: Anesthesiology

## 2016-11-18 VITALS — BP 165/97 | HR 61 | Temp 97.1°F | Resp 16 | Ht 71.0 in | Wt 310.0 lb

## 2016-11-18 DIAGNOSIS — M5431 Sciatica, right side: Secondary | ICD-10-CM

## 2016-11-18 DIAGNOSIS — M5432 Sciatica, left side: Secondary | ICD-10-CM | POA: Diagnosis present

## 2016-11-18 DIAGNOSIS — E108 Type 1 diabetes mellitus with unspecified complications: Secondary | ICD-10-CM | POA: Diagnosis not present

## 2016-11-18 DIAGNOSIS — IMO0002 Reserved for concepts with insufficient information to code with codable children: Secondary | ICD-10-CM

## 2016-11-18 DIAGNOSIS — R52 Pain, unspecified: Secondary | ICD-10-CM

## 2016-11-18 DIAGNOSIS — M5136 Other intervertebral disc degeneration, lumbar region: Secondary | ICD-10-CM

## 2016-11-18 DIAGNOSIS — E1065 Type 1 diabetes mellitus with hyperglycemia: Secondary | ICD-10-CM | POA: Diagnosis not present

## 2016-11-18 DIAGNOSIS — M4697 Unspecified inflammatory spondylopathy, lumbosacral region: Secondary | ICD-10-CM

## 2016-11-18 DIAGNOSIS — M47817 Spondylosis without myelopathy or radiculopathy, lumbosacral region: Secondary | ICD-10-CM

## 2016-11-18 MED ORDER — LACTATED RINGERS IV SOLN
1000.0000 mL | INTRAVENOUS | Status: DC
  Administered 2016-11-18: 1000 mL via INTRAVENOUS

## 2016-11-18 MED ORDER — MIDAZOLAM HCL 5 MG/5ML IJ SOLN
5.0000 mg | Freq: Once | INTRAMUSCULAR | Status: AC
  Administered 2016-11-18: 1 mg via INTRAVENOUS
  Filled 2016-11-18: qty 5

## 2016-11-18 MED ORDER — TRIAMCINOLONE ACETONIDE 40 MG/ML IJ SUSP
40.0000 mg | Freq: Once | INTRAMUSCULAR | Status: AC
  Administered 2016-11-18: 40 mg

## 2016-11-18 MED ORDER — SODIUM CHLORIDE 0.9% FLUSH
10.0000 mL | Freq: Once | INTRAVENOUS | Status: AC
  Administered 2016-11-18: 10 mL

## 2016-11-18 MED ORDER — ROPIVACAINE HCL 2 MG/ML IJ SOLN
10.0000 mL | Freq: Once | INTRAMUSCULAR | Status: AC
  Administered 2016-11-18: 10 mL via EPIDURAL
  Filled 2016-11-18: qty 10

## 2016-11-18 MED ORDER — SODIUM CHLORIDE 0.9 % IJ SOLN
INTRAMUSCULAR | Status: AC
  Filled 2016-11-18: qty 10

## 2016-11-18 MED ORDER — LIDOCAINE HCL (PF) 1 % IJ SOLN
5.0000 mL | Freq: Once | INTRAMUSCULAR | Status: AC
  Administered 2016-11-18: 5 mL via SUBCUTANEOUS
  Filled 2016-11-18: qty 5

## 2016-11-18 MED ORDER — IOPAMIDOL (ISOVUE-M 200) INJECTION 41%
20.0000 mL | Freq: Once | INTRAMUSCULAR | Status: DC | PRN
  Administered 2016-11-18: 10 mL
  Filled 2016-11-18: qty 20

## 2016-11-18 MED ORDER — TRIAMCINOLONE ACETONIDE 40 MG/ML IJ SUSP
INTRAMUSCULAR | Status: AC
  Filled 2016-11-18: qty 1

## 2016-11-18 MED ORDER — IOPAMIDOL (ISOVUE-M 200) INJECTION 41%
INTRAMUSCULAR | Status: AC
  Filled 2016-11-18: qty 10

## 2016-11-18 NOTE — Progress Notes (Signed)
Safety precautions to be maintained throughout the outpatient stay will include: orient to surroundings, keep bed in low position, maintain call bell within reach at all times, provide assistance with transfer out of bed and ambulation.  

## 2016-11-18 NOTE — Patient Instructions (Addendum)
Pain Management Discharge Instructions  General Discharge Instructions :  If you need to reach your doctor call: Monday-Friday 8:00 am - 4:00 pm at [phone] or toll free [phone].  After clinic hours [phone] to have operator reach doctor.  Bring all of your medication bottles to all your appointments in the pain clinic.  To cancel or reschedule your appointment with Pain Management please remember to call 24 hours in advance to avoid a fee.  Refer to the educational materials which you have been given on: General Risks, I had my Procedure. Discharge Instructions, Post Sedation.  Post Procedure Instructions:  The drugs you were given will stay in your system until tomorrow, so for the next 24 hours you should not drive, make any legal decisions or drink any alcoholic beverages.  You may eat anything you prefer, but it is better to start with liquids then soups and crackers, and gradually work up to solid foods.  Please notify your doctor immediately if you have any unusual bleeding, trouble breathing or pain that is not related to your normal pain.  Depending on the type of procedure that was done, some parts of your body may feel week and/or numb.  This usually clears up by tonight or the next day.  Walk with the use of an assistive device or accompanied by an adult for the 24 hours.  You may use ice on the affected area for the first 24 hours.  Put ice in a Ziploc bag and cover with a towel and place against area 15 minutes on 15 minutes off.  You may switch to heat after 24 hours.Epidural Steroid Injection An epidural steroid injection is a shot of steroid medicine and numbing medicine that is given into the space between the spinal cord and the bones in your back (epidural space). The shot helps relieve pain caused by an irritated or swollen nerve root. The amount of pain relief you get from the injection depends on what is causing the nerve to be swollen and irritated,  and how long your pain lasts. You are more likely to benefit from this injection if your pain is strong and comes on suddenly rather than if you have had pain for a long time. Tell a health care provider about:  Any allergies you have.  All medicines you are taking, including vitamins, herbs, eye drops, creams, and over-the-counter medicines.  Any problems you or family members have had with anesthetic medicines.  Any blood disorders you have.  Any surgeries you have had.  Any medical conditions you have.  Whether you are pregnant or may be pregnant. What are the risks? Generally, this is a safe procedure. However, problems may occur, including:  Headache.  Bleeding.  Infection.  Allergic reaction to medicines.  Damage to your nerves. What happens before the procedure? Staying hydrated  Follow instructions from your health care provider about hydration, which may include:  Up to 2 hours before the procedure - you may continue to drink clear liquids, such as water, clear fruit juice, black coffee, and plain tea. Eating and drinking restrictions  Follow instructions from your health care provider about eating and drinking, which may include:  8 hours before the procedure - stop eating heavy meals or foods such as meat, fried foods, or fatty foods.  6 hours before the procedure - stop eating light meals or foods, such as toast or cereal.  6 hours before the procedure - stop drinking milk or drinks that contain milk.    2 hours before the procedure - stop drinking clear liquids. Medicine  You may be given medicines to lower anxiety.  Ask your health care provider about:  Changing or stopping your regular medicines. This is especially important if you are taking diabetes medicines or blood thinners.  Taking medicines such as aspirin and ibuprofen. These medicines can thin your blood. Do not take these medicines before your procedure if your health care provider instructs  you not to. General instructions  Plan to have someone take you home from the hospital or clinic. What happens during the procedure?  You may receive a medicine to help you relax (sedative).  You will be asked to lie on your abdomen.  The injection site will be cleaned.  A numbing medicine (local anesthetic) will be used to numb the injection site.  A needle will be inserted through your skin into the epidural space. You may feel some discomfort when this happens. An X-ray machine will be used to make sure the needle is put as close as possible to the affected nerve.  A steroid medicine and a local anesthetic will be injected into the epidural space.  The needle will be removed.  A bandage (dressing) will be put over the injection site. What happens after the procedure?  Your blood pressure, heart rate, breathing rate, and blood oxygen level will be monitored until the medicines you were given have worn off.  Your arm or leg may feel weak or numb for a few hours.  The injection site may feel sore.  Do not drive for 24 hours if you received a sedative. This information is not intended to replace advice given to you by your health care provider. Make sure you discuss any questions you have with your health care provider. Document Released: 07/22/2007 Document Revised: 09/26/2015 Document Reviewed: 07/31/2015 Elsevier Interactive Patient Education  2017 [employer]  

## 2016-11-19 ENCOUNTER — Telehealth: Payer: Self-pay | Admitting: *Deleted

## 2016-11-19 NOTE — Telephone Encounter (Signed)
Voicemail left with patient to please call if there are questions or concerns re; procedure on yesterday.
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
## 2016-11-27 ENCOUNTER — Ambulatory Visit: Payer: Medicare HMO | Admitting: Family

## 2016-12-02 ENCOUNTER — Ambulatory Visit: Payer: Medicare HMO | Admitting: Family

## 2016-12-09 ENCOUNTER — Ambulatory Visit: Payer: Medicare HMO | Admitting: Family

## 2016-12-11 ENCOUNTER — Encounter: Payer: Self-pay | Admitting: Family

## 2016-12-11 ENCOUNTER — Ambulatory Visit: Payer: Medicare HMO | Attending: Family | Admitting: Family

## 2016-12-11 VITALS — BP 100/60 | HR 65 | Resp 20 | Ht 71.0 in | Wt 309.4 lb

## 2016-12-11 DIAGNOSIS — Z87891 Personal history of nicotine dependence: Secondary | ICD-10-CM | POA: Insufficient documentation

## 2016-12-11 DIAGNOSIS — Z955 Presence of coronary angioplasty implant and graft: Secondary | ICD-10-CM | POA: Diagnosis not present

## 2016-12-11 DIAGNOSIS — Z794 Long term (current) use of insulin: Secondary | ICD-10-CM | POA: Insufficient documentation

## 2016-12-11 DIAGNOSIS — I252 Old myocardial infarction: Secondary | ICD-10-CM | POA: Diagnosis not present

## 2016-12-11 DIAGNOSIS — F329 Major depressive disorder, single episode, unspecified: Secondary | ICD-10-CM | POA: Diagnosis not present

## 2016-12-11 DIAGNOSIS — Z8249 Family history of ischemic heart disease and other diseases of the circulatory system: Secondary | ICD-10-CM | POA: Diagnosis not present

## 2016-12-11 DIAGNOSIS — R569 Unspecified convulsions: Secondary | ICD-10-CM | POA: Insufficient documentation

## 2016-12-11 DIAGNOSIS — R0789 Other chest pain: Secondary | ICD-10-CM

## 2016-12-11 DIAGNOSIS — E119 Type 2 diabetes mellitus without complications: Secondary | ICD-10-CM | POA: Insufficient documentation

## 2016-12-11 DIAGNOSIS — I2510 Atherosclerotic heart disease of native coronary artery without angina pectoris: Secondary | ICD-10-CM | POA: Diagnosis not present

## 2016-12-11 DIAGNOSIS — E669 Obesity, unspecified: Secondary | ICD-10-CM | POA: Diagnosis not present

## 2016-12-11 DIAGNOSIS — F32A Depression, unspecified: Secondary | ICD-10-CM | POA: Insufficient documentation

## 2016-12-11 DIAGNOSIS — Z951 Presence of aortocoronary bypass graft: Secondary | ICD-10-CM | POA: Diagnosis not present

## 2016-12-11 DIAGNOSIS — Z808 Family history of malignant neoplasm of other organs or systems: Secondary | ICD-10-CM | POA: Insufficient documentation

## 2016-12-11 DIAGNOSIS — I110 Hypertensive heart disease with heart failure: Secondary | ICD-10-CM | POA: Insufficient documentation

## 2016-12-11 DIAGNOSIS — Z886 Allergy status to analgesic agent status: Secondary | ICD-10-CM | POA: Diagnosis not present

## 2016-12-11 DIAGNOSIS — Z8489 Family history of other specified conditions: Secondary | ICD-10-CM | POA: Insufficient documentation

## 2016-12-11 DIAGNOSIS — Z9889 Other specified postprocedural states: Secondary | ICD-10-CM | POA: Insufficient documentation

## 2016-12-11 DIAGNOSIS — Z7902 Long term (current) use of antithrombotics/antiplatelets: Secondary | ICD-10-CM | POA: Insufficient documentation

## 2016-12-11 DIAGNOSIS — Z823 Family history of stroke: Secondary | ICD-10-CM | POA: Insufficient documentation

## 2016-12-11 DIAGNOSIS — I5032 Chronic diastolic (congestive) heart failure: Secondary | ICD-10-CM | POA: Insufficient documentation

## 2016-12-11 DIAGNOSIS — J441 Chronic obstructive pulmonary disease with (acute) exacerbation: Secondary | ICD-10-CM | POA: Diagnosis not present

## 2016-12-11 DIAGNOSIS — Z6841 Body Mass Index (BMI) 40.0 and over, adult: Secondary | ICD-10-CM | POA: Diagnosis not present

## 2016-12-11 DIAGNOSIS — Z833 Family history of diabetes mellitus: Secondary | ICD-10-CM | POA: Diagnosis not present

## 2016-12-11 DIAGNOSIS — I10 Essential (primary) hypertension: Secondary | ICD-10-CM

## 2016-12-11 NOTE — Progress Notes (Signed)
Patient ID: Tina Hayes, female    DOB: 1945/06/21, 71 y.o.   MRN: [id number]  HPI  Ms Lapka is a 71 y/o female with a history of seizures, HTN, cardiac stents, DM, depression, CAD, COPD, chronic back pain, anxiety, obesity, remote tobacco use and chronic heart failure.   Last echo was done 06/17/16 and showed an EF of 60-65% along with mild MR and moderate TR. Pulmonary systolic pressure was mildly to moderately elevated. Stress test done 06/18/16.  Was in the ED 11/13/16 due to left upper arm pain. Treated and released. Was in the ED 08/14/16 due to nausea, vomiting and diarrhea. Evaluated and discharged home.  Admitted 06/16/16 with chest pain. Had negative troponins. Cardiology consult done and patient underwent stress test which was read as low risk for ischemia. Medications were adjusted and she was discharged home after 2 days. Admitted 08/28/15 due to COPD exacerbation. Received IV steroids, nebulizers and antibiotics. She was discharged home after 2 days.   She presents today for her follow-up visit with a chief complaint of chest pain that's been present over the last week or so. She describes it as intermittent tending to occur more often when she lies down. Has taken SL NTG a couple of times with minimal relief. Having some left shoulder pain that is also bothering her. Has associated shortness of breath, fatigue, palpitations and light-headedness along with this.   Past Medical History:  Diagnosis Date  . Anxiety   . CHF (congestive heart failure) ([place])   . Chronic back pain   . COPD (chronic obstructive pulmonary disease) ([place])   . Coronary artery disease   . Depression   . Diabetes mellitus without complication ([place])   . H/O heart artery stent 06/17/2016   5 stents per patient  . Heart attack ([place])    Total of 3 per pt.  . Hypertension   . Seizures ([place])    Past Surgical History:  Procedure Laterality Date  . ANKLE SURGERY    . CORONARY ARTERY BYPASS GRAFT    . KNEE SURGERY      Family History  Problem Relation Age of Onset  . Diabetes Unknown   . Hypertension Unknown   . Diabetes Mother   . Heart failure Mother   . Heart disease Mother   . Heart attack Mother   . Stroke Mother   . Depression Mother   . Hypertension Mother   . Cancer Sister        brain  . Hypertension Sister   . Diabetes Brother   . Hypertension Brother   . Heart failure Sister   . Heart attack Sister   . SIDS Sister    Social History  Substance Use Topics  . Smoking status: Former Smoker    Packs/day: 0.25    Years: 20.00    Types: Cigarettes    Quit date: 07/01/2006  . Smokeless tobacco: Never Used  . Alcohol use No   Allergies  Allergen Reactions  . Aspirin Anaphylaxis   Prior to Admission medications   Medication Sig Start Date End Date Taking? Authorizing Provider  ACCU-CHEK AVIVA PLUS test strip  05/19/16  Yes [provider]  albuterol (PROVENTIL HFA;VENTOLIN HFA) 108 (90 Base) MCG/ACT inhaler Inhale 2 puffs into the lungs every 6 (six) hours as needed for wheezing or shortness of breath. 08/30/15  Yes Epifanio Lesches, MD  amLODipine (NORVASC) 5 MG tablet Take 1 tablet (5 mg total) by mouth daily. 08/14/16  Yes Ida Rogue  J, MD  atorvastatin (LIPITOR) 40 MG tablet Take 40 mg by mouth daily at 6 PM.  07/10/16  Yes [provider]  Blood Glucose Monitoring Suppl (ACCU-CHEK AVIVA PLUS) w/Device KIT  06/13/16  Yes [provider]  carvedilol (COREG) 3.125 MG tablet Take 1 tablet (3.125 mg total) by mouth 2 (two) times daily with a meal. 08/12/16  Yes Hackney, Tina A, FNP  cetirizine (ZYRTEC) 10 MG tablet Take 10 mg by mouth daily.   Yes [provider]  citalopram (CELEXA) 20 MG tablet Take 20 mg by mouth daily.   Yes [provider]  clopidogrel (PLAVIX) 75 MG tablet Take 1 tablet (75 mg total) by mouth daily. 08/14/16  Yes Gollan, Kathlene November, MD  cyclobenzaprine (FLEXERIL) 5 MG tablet Take 5 mg by mouth 3 (three) times daily  as needed.  09/26/16  Yes [provider]  furosemide (LASIX) 40 MG tablet Take 1 tablet (40 mg total) by mouth 2 (two) times daily. 08/12/16  Yes Hackney, Otila Kluver A, FNP  insulin aspart (NOVOLOG) 100 UNIT/ML injection Inject 5 Units into the skin daily. 10 units in the morning 5 units at lunch   Yes [provider]  insulin glargine (LANTUS) 100 UNIT/ML injection Inject 0.2-0.3 mLs (20-30 Units total) into the skin 2 (two) times daily. Take 20 units in morning and 30 units at night Patient taking differently: Inject 20-30 Units into the skin 2 (two) times daily. 40 units at bedtime 06/18/16 12/11/16 Yes Mody, Ulice Bold, MD  isosorbide mononitrate (IMDUR) 30 MG 24 hr tablet Take 1 tablet (30 mg total) by mouth 2 (two) times daily. 08/12/16  Yes Darylene Price A, FNP  levETIRAcetam (KEPPRA) 750 MG tablet Take 1 tablet (750 mg total) by mouth 2 (two) times daily. 06/05/15  Yes Rai, Ripudeep K, MD  losartan (COZAAR) 100 MG tablet Take 1 tablet (100 mg total) by mouth daily. 08/12/16  Yes Hackney, Otila Kluver A, FNP  meloxicam (MOBIC) 7.5 MG tablet Take 1 tablet (7.5 mg total) by mouth daily. 06/05/15  Yes Rai, Ripudeep K, MD  nitroGLYCERIN (NITROSTAT) 0.4 MG SL tablet Place 1 tablet (0.4 mg total) under the tongue every 5 (five) minutes as needed for chest pain. 07/01/16 07/01/17 Yes Gollan, Kathlene November, MD  omeprazole (PRILOSEC) 40 MG capsule Take 1 capsule (40 mg total) by mouth 2 (two) times daily. Patient taking differently: Take 40 mg by mouth daily.  06/05/15  Yes Rai, Ripudeep K, MD  potassium chloride SA (K-DUR,KLOR-CON) 20 MEQ tablet Take 1 tablet (20 mEq total) by mouth daily. 08/12/16  Yes Darylene Price A, FNP  tiotropium (SPIRIVA) 18 MCG inhalation capsule Place 18 mcg into inhaler and inhale daily.   Yes [provider]  traMADol (ULTRAM) 50 MG tablet Take 2 tablets (100 mg total) by mouth every 6 (six) hours as needed for moderate pain. 10/24/16  Yes Molli Barrows, MD  traZODone (DESYREL) 50 MG  tablet Take 50 mg by mouth at bedtime.   Yes [provider]  UNIFINE PENTIPS 31G X 6 MM [place]  07/23/16  Yes [provider]   Review of Systems  Constitutional: Positive for fatigue. Negative for appetite change.  HENT: Negative for congestion, postnasal drip and sore throat.   Eyes: Negative.   Respiratory: Positive for shortness of breath (when lying down flat at night). Negative for cough and chest tightness.   Cardiovascular: Positive for chest pain (at times when laying down over the last week) and palpitations. Negative  for leg swelling.  Gastrointestinal: Negative for abdominal distention and abdominal pain.  Endocrine: Negative.   Genitourinary: Negative.   Musculoskeletal: Positive for arthralgias (left shoulder pain) and back pain (chronic). Negative for neck pain.  Skin: Negative.   Allergic/Immunologic: Negative.   Neurological: Positive for light-headedness. Negative for dizziness.  Hematological: Negative for adenopathy. Does not bruise/bleed easily.  Psychiatric/Behavioral: Positive for dysphoric mood (over the last week) and sleep disturbance (trouble falling asleep). Negative for suicidal ideas. The patient is not nervous/anxious.    Vitals:   12/11/16 0837  BP: 100/60  Pulse: 65  Resp: 20  SpO2: 99%  Weight: (!) 309 lb 6 oz (140.3 kg)  Height: _0  (1.803 m)   Wt Readings from Last 3 Encounters:  12/11/16 (!) 309 lb 6 oz (140.3 kg)  11/18/16 (!) 310 lb (140.6 kg)  11/13/16 (!) 307 lb (139.3 kg)    Lab Results  Component Value Date   CREATININE 0.75 11/13/2016   CREATININE 0.69 08/14/2016   CREATININE 0.93 08/12/2016   Physical Exam  Constitutional: She is oriented to person, place, and time. She appears well-developed and well-nourished.  HENT:  Head: Normocephalic and atraumatic.  Neck: Normal range of motion. Neck supple. No JVD present.  Cardiovascular: Regular rhythm.  Bradycardia present.   Tender to palpation over anterior  chest wall  Pulmonary/Chest: Effort normal. She has no wheezes. She has no rales.  Abdominal: Soft. She exhibits no distension. There is no tenderness.  Musculoskeletal: She exhibits no edema or tenderness.  Neurological: She is alert and oriented to person, place, and time.  Skin: Skin is warm and dry.  Psychiatric: She has a normal mood and affect. Her behavior is normal.  Nursing note and vitals reviewed.   Assessment & Plan:  1: Chronic heart failure with preserved ejection fraction- - NYHA class III - euvolemic - weighing daily and says that her home weight has been stable.  Reminded to call for an overnight weight gain of >2 pounds or a weekly weight gain of >5 pounds.  - drinking about 60 ounces of fluid daily.  - saw her cardiologist Rockey Situ) 10/02/16  2: HTN- - BP looks good today - BMP from 11/13/16 reviewed; potassium 4.0 and GFR >60 - sees PCP ([place])   3: Chest pain- - present for about the last week; worse when lying down - EKG done in the office and is unchanged from previous EKG's - most likely pain is not heart related since pain is reproducible upon palpation - should this continue or worsen, she's to call back  4: Depression-  - she admits to some depression recently as her boyfriend's mother is dying - she feels like this stress is affecting her health - emotional support offered and encouraged her to try not to let stress get to her  Patient did not bring her medications nor a list. Each medication was verbally reviewed with the patient and she was encouraged to bring the bottles to every visit to confirm accuracy of list.  Return in 3 months or sooner for any questions/problems before then.

## 2016-12-11 NOTE — Patient Instructions (Signed)
Continue weighing daily and call for an overnight weight gain of > 2 pounds or a weekly weight gain of >5 pounds. 

## 2016-12-30 ENCOUNTER — Ambulatory Visit: Payer: Medicare HMO | Admitting: Anesthesiology

## 2017-01-06 ENCOUNTER — Telehealth: Payer: Self-pay | Admitting: Anesthesiology

## 2017-01-06 NOTE — Telephone Encounter (Signed)
Tina Hayes could not keep her original appt and had to resched to 01-24-17. The authorization is out on 01-05-17. She will need new authorization for procedure

## 2017-01-08 ENCOUNTER — Telehealth: Payer: Self-pay | Admitting: Family

## 2017-01-08 ENCOUNTER — Ambulatory Visit: Payer: Medicaid Other | Admitting: Family

## 2017-01-08 NOTE — Telephone Encounter (Signed)
Patient did not show for her [facility] appointment on 01/08/17. Will attempt to reschedule.

## 2017-01-19 ENCOUNTER — Ambulatory Visit: Payer: Medicare HMO | Admitting: Anesthesiology

## 2017-01-22 ENCOUNTER — Other Ambulatory Visit: Payer: Self-pay | Admitting: Anesthesiology

## 2017-01-22 ENCOUNTER — Ambulatory Visit
Admission: RE | Admit: 2017-01-22 | Discharge: 2017-01-22 | Disposition: A | Discharge status: Home / Self Care | Payer: Medicare HMO | Source: Ambulatory Visit | Attending: Anesthesiology | Admitting: Anesthesiology

## 2017-01-22 ENCOUNTER — Encounter: Payer: Self-pay | Admitting: Anesthesiology

## 2017-01-22 ENCOUNTER — Ambulatory Visit (HOSPITAL_BASED_OUTPATIENT_CLINIC_OR_DEPARTMENT_OTHER): Payer: Medicare HMO | Admitting: Anesthesiology

## 2017-01-22 VITALS — BP 149/67 | HR 55 | Temp 97.5°F | Resp 20 | Ht 71.0 in | Wt 318.0 lb

## 2017-01-22 DIAGNOSIS — M170 Bilateral primary osteoarthritis of knee: Secondary | ICD-10-CM

## 2017-01-22 DIAGNOSIS — M5432 Sciatica, left side: Secondary | ICD-10-CM | POA: Diagnosis not present

## 2017-01-22 DIAGNOSIS — R52 Pain, unspecified: Secondary | ICD-10-CM

## 2017-01-22 DIAGNOSIS — M25512 Pain in left shoulder: Secondary | ICD-10-CM

## 2017-01-22 DIAGNOSIS — M4697 Unspecified inflammatory spondylopathy, lumbosacral region: Secondary | ICD-10-CM

## 2017-01-22 DIAGNOSIS — M5431 Sciatica, right side: Secondary | ICD-10-CM

## 2017-01-22 DIAGNOSIS — M47817 Spondylosis without myelopathy or radiculopathy, lumbosacral region: Secondary | ICD-10-CM

## 2017-01-22 DIAGNOSIS — M5136 Other intervertebral disc degeneration, lumbar region: Secondary | ICD-10-CM

## 2017-01-22 MED ORDER — MIDAZOLAM HCL 5 MG/5ML IJ SOLN
5.0000 mg | Freq: Once | INTRAMUSCULAR | Status: AC
  Administered 2017-01-22: 2 mg via INTRAVENOUS

## 2017-01-22 MED ORDER — LACTATED RINGERS IV SOLN: 1000.0000 mL | INTRAVENOUS | Status: DC

## 2017-01-22 MED ORDER — TRIAMCINOLONE ACETONIDE 40 MG/ML IJ SUSP
40.0000 mg | Freq: Once | INTRAMUSCULAR | Status: AC
  Administered 2017-01-22: 40 mg

## 2017-01-22 MED ORDER — ROPIVACAINE HCL 2 MG/ML IJ SOLN
10.0000 mL | Freq: Once | INTRAMUSCULAR | Status: AC
  Administered 2017-01-22: 10 mL via EPIDURAL

## 2017-01-22 MED ORDER — IOPAMIDOL (ISOVUE-M 200) INJECTION 41%
20.0000 mL | Freq: Once | INTRAMUSCULAR | Status: DC | PRN
  Administered 2017-01-22: 10 mL
  Filled 2017-01-22: qty 20

## 2017-01-22 MED ORDER — IOPAMIDOL (ISOVUE-M 200) INJECTION 41%
INTRAMUSCULAR | Status: AC
  Filled 2017-01-22: qty 10

## 2017-01-22 MED ORDER — MIDAZOLAM HCL 5 MG/5ML IJ SOLN
INTRAMUSCULAR | Status: AC
  Filled 2017-01-22: qty 5

## 2017-01-22 MED ORDER — TRAMADOL HCL 50 MG PO TABS: 100.0000 mg | ORAL_TABLET | Freq: Four times a day (QID) | ORAL | 2 refills | Status: DC | PRN

## 2017-01-22 MED ORDER — TRIAMCINOLONE ACETONIDE 40 MG/ML IJ SUSP
INTRAMUSCULAR | Status: AC
  Filled 2017-01-22: qty 1

## 2017-01-22 MED ORDER — LIDOCAINE HCL (PF) 1 % IJ SOLN: 5.0000 mL | Freq: Once | INTRAMUSCULAR | Status: DC

## 2017-01-22 MED ORDER — SODIUM CHLORIDE 0.9% FLUSH
10.0000 mL | Freq: Once | INTRAVENOUS | Status: AC
  Administered 2017-01-22: 5 mL

## 2017-01-22 MED ORDER — ROPIVACAINE HCL 2 MG/ML IJ SOLN
INTRAMUSCULAR | Status: AC
  Filled 2017-01-22: qty 10

## 2017-01-22 MED ORDER — SODIUM CHLORIDE 0.9 % IJ SOLN
INTRAMUSCULAR | Status: AC
  Filled 2017-01-22: qty 10

## 2017-01-22 NOTE — Patient Instructions (Signed)
Please bring your Tramadol bottle for pill count as soon as possible. Pain Management Discharge Instructions  General Discharge Instructions :  If you need to reach your doctor call: Monday-Friday 8:00 am - 4:00 pm at [phone] or toll free [phone].  After clinic hours [phone] to have operator reach doctor.  Bring all of your medication bottles to all your appointments in the pain clinic.  To cancel or reschedule your appointment with Pain Management please remember to call 24 hours in advance to avoid a fee.  Refer to the educational materials which you have been given on: General Risks, I had my Procedure. Discharge Instructions, Post Sedation.  Post Procedure Instructions:  The drugs you were given will stay in your system until tomorrow, so for the next 24 hours you should not drive, make any legal decisions or drink any alcoholic beverages.  You may eat anything you prefer, but it is better to start with liquids then soups and crackers, and gradually work up to solid foods.  Please notify your doctor immediately if you have any unusual bleeding, trouble breathing or pain that is not related to your normal pain.  Depending on the type of procedure that was done, some parts of your body may feel week and/or numb.  This usually clears up by tonight or the next day.  Walk with the use of an assistive device or accompanied by an adult for the 24 hours.  You may use ice on the affected area for the first 24 hours.  Put ice in a Ziploc bag and cover with a towel and place against area 15 minutes on 15 minutes off.  You may switch to heat after 24 hours.

## 2017-01-22 NOTE — Progress Notes (Signed)
Nursing Pain Medication Assessment:  Safety precautions to be maintained throughout the outpatient stay will include: orient to surroundings, keep bed in low position, maintain call bell within reach at all times, provide assistance with transfer out of bed and ambulation.  Medication Inspection Compliance: Tina Hayes did not comply with our request to bring her pills to be counted. She was reminded that bringing the medication bottles, even when empty, is a requirement.  Medication: None brought in. Pill/Patch Count: None available to be counted. Bottle Appearance: No container available. Did not bring bottle(s) to appointment. Filled Date: N/A Last Medication intake:  Yesterday 

## 2017-01-23 ENCOUNTER — Telehealth: Payer: Self-pay | Admitting: *Deleted

## 2017-01-26 ENCOUNTER — Ambulatory Visit: Payer: Medicare HMO | Admitting: Family

## 2017-01-27 ENCOUNTER — Telehealth: Payer: Self-pay | Admitting: Family

## 2017-01-27 ENCOUNTER — Ambulatory Visit: Payer: Medicare HMO | Attending: Anesthesiology

## 2017-01-27 ENCOUNTER — Ambulatory Visit: Payer: Medicare HMO | Attending: Family | Admitting: Family

## 2017-01-27 ENCOUNTER — Encounter: Payer: Self-pay | Admitting: Family

## 2017-01-27 VITALS — BP 144/77 | HR 57 | Resp 20 | Ht 71.0 in | Wt 319.1 lb

## 2017-01-27 DIAGNOSIS — G8929 Other chronic pain: Secondary | ICD-10-CM | POA: Diagnosis not present

## 2017-01-27 DIAGNOSIS — R569 Unspecified convulsions: Secondary | ICD-10-CM | POA: Insufficient documentation

## 2017-01-27 DIAGNOSIS — Z955 Presence of coronary angioplasty implant and graft: Secondary | ICD-10-CM | POA: Insufficient documentation

## 2017-01-27 DIAGNOSIS — Z79899 Other long term (current) drug therapy: Secondary | ICD-10-CM | POA: Insufficient documentation

## 2017-01-27 DIAGNOSIS — I2510 Atherosclerotic heart disease of native coronary artery without angina pectoris: Secondary | ICD-10-CM | POA: Diagnosis not present

## 2017-01-27 DIAGNOSIS — I5033 Acute on chronic diastolic (congestive) heart failure: Secondary | ICD-10-CM | POA: Insufficient documentation

## 2017-01-27 DIAGNOSIS — Z87891 Personal history of nicotine dependence: Secondary | ICD-10-CM | POA: Insufficient documentation

## 2017-01-27 DIAGNOSIS — I252 Old myocardial infarction: Secondary | ICD-10-CM | POA: Diagnosis not present

## 2017-01-27 DIAGNOSIS — I110 Hypertensive heart disease with heart failure: Secondary | ICD-10-CM | POA: Diagnosis not present

## 2017-01-27 DIAGNOSIS — I5031 Acute diastolic (congestive) heart failure: Secondary | ICD-10-CM | POA: Insufficient documentation

## 2017-01-27 DIAGNOSIS — E669 Obesity, unspecified: Secondary | ICD-10-CM | POA: Diagnosis not present

## 2017-01-27 DIAGNOSIS — F329 Major depressive disorder, single episode, unspecified: Secondary | ICD-10-CM | POA: Diagnosis not present

## 2017-01-27 DIAGNOSIS — M549 Dorsalgia, unspecified: Secondary | ICD-10-CM | POA: Diagnosis not present

## 2017-01-27 DIAGNOSIS — F419 Anxiety disorder, unspecified: Secondary | ICD-10-CM | POA: Diagnosis not present

## 2017-01-27 DIAGNOSIS — J449 Chronic obstructive pulmonary disease, unspecified: Secondary | ICD-10-CM | POA: Diagnosis not present

## 2017-01-27 DIAGNOSIS — E119 Type 2 diabetes mellitus without complications: Secondary | ICD-10-CM

## 2017-01-27 DIAGNOSIS — Z7902 Long term (current) use of antithrombotics/antiplatelets: Secondary | ICD-10-CM | POA: Diagnosis not present

## 2017-01-27 DIAGNOSIS — Z794 Long term (current) use of insulin: Secondary | ICD-10-CM | POA: Insufficient documentation

## 2017-01-27 DIAGNOSIS — I10 Essential (primary) hypertension: Secondary | ICD-10-CM

## 2017-01-27 DIAGNOSIS — R0602 Shortness of breath: Secondary | ICD-10-CM | POA: Diagnosis present

## 2017-01-27 HISTORY — DX: Acute diastolic (congestive) heart failure: I50.31

## 2017-01-27 LAB — BASIC METABOLIC PANEL
ANION GAP: 5 (ref 5–15)
BUN: 19 mg/dL (ref 6–20)
CHLORIDE: 108 mmol/L (ref 101–111)
CO2: 27 mmol/L (ref 22–32)
Calcium: 8.9 mg/dL (ref 8.9–10.3)
Creatinine, Ser: 0.73 mg/dL (ref 0.44–1.00)
Glucose, Bld: 77 mg/dL (ref 65–99)
POTASSIUM: 4 mmol/L (ref 3.5–5.1)
SODIUM: 140 mmol/L (ref 135–145)

## 2017-01-27 MED ORDER — METOLAZONE 2.5 MG PO TABS: 2.5000 mg | ORAL_TABLET | Freq: Every day | ORAL | 0 refills | Status: DC

## 2017-01-27 NOTE — Patient Instructions (Addendum)
Continue weighing daily and call for an overnight weight gain of > 2 pounds or a weekly weight gain of >5 pounds.  Avoid taking meloxicam if you can.  Adding metolazone (booster fluid pill) 2.5mg  daily for the next 3 days. Best to take it 1/2 hour prior to furosemide if you can.  Increase potassium to 1 tablet twice daily for the next 3 days as well.   Decrease fluid intake to 40-60 ounces of fluid daily (max 4 water bottles daily)

## 2017-01-27 NOTE — Progress Notes (Signed)
Nursing Pain Medication Assessment:  Safety precautions to be maintained throughout the outpatient stay will include: orient to surroundings, keep bed in low position, maintain call bell within reach at all times, provide assistance with transfer out of bed and ambulation.  Medication Inspection Compliance: Pill count conducted under aseptic conditions, in front of the patient. Neither the pills nor the bottle was removed from the patient's sight at any time. Once count was completed pills were immediately returned to the patient in their original bottle.  Medication: Tramadol (Ultram) Pill/Patch Count: 25.5 of 180 pills remain Pill/Patch Appearance: Markings consistent with prescribed medication Bottle Appearance: Standard pharmacy container. Clearly labeled. Filled Date: 05 / 31 / 2018 Last Medication intake:  Today  Came in to visit today for pill count and to pick up prescriptions.

## 2017-01-27 NOTE — Telephone Encounter (Signed)
LM on voicemail that lab work drawn today (01/27/17) looked good.

## 2017-01-27 NOTE — Progress Notes (Addendum)
Patient ID: Tina Hayes, female    DOB: 08-Dec-1945, 71 y.o.   MRN: [id number]  HPI  Ms Selke is a 71 y/o female with a history of seizures, HTN, cardiac stents, DM, depression, CAD, COPD, chronic back pain, anxiety, obesity, remote tobacco use and chronic heart failure.   Last echo was done 06/17/16 and showed an EF of 60-65% along with mild MR and moderate TR. Pulmonary systolic pressure was mildly to moderately elevated. Stress test done 06/18/16.  Was in the ED 11/13/16 due to left upper arm pain. Treated and released. Was in the ED 08/14/16 due to nausea, vomiting and diarrhea. Evaluated and discharged home.  Admitted 06/16/16 with chest pain. Had negative troponins. Cardiology consult done and patient underwent stress test which was read as low risk for ischemia. Medications were adjusted and she was discharged home after 2 days. Admitted 08/28/15 due to COPD exacerbation. Received IV steroids, nebulizers and antibiotics. She was discharged home after 2 days.   She presents today for her follow-up visit with a chief complaint of moderate shortness of breath with minimal exertion. She describes this as chronic in nature having been present for several years with varying levels of severity. She does feel like it has worsened some over the last week. She has associated fatigue, edema, chest tightness, palpitations, light-headedness and weight gain along with this. She denies any difficulty sleeping. Had a sleep study done about 6 months ago and she says that the results were negative.   Past Medical History:  Diagnosis Date  . Anxiety   . CHF (congestive heart failure) ([place])   . Chronic back pain   . COPD (chronic obstructive pulmonary disease) ([place])   . Coronary artery disease   . Depression   . Diabetes mellitus without complication ([place])   . H/O heart artery stent 06/17/2016   5 stents per patient  . Heart attack ([place])    Total of 3 per pt.  . Hypertension   . Seizures ([place])    Past  Surgical History:  Procedure Laterality Date  . ANKLE SURGERY    . CORONARY ARTERY BYPASS GRAFT    . KNEE SURGERY     Family History  Problem Relation Age of Onset  . Diabetes Unknown   . Hypertension Unknown   . Diabetes Mother   . Heart failure Mother   . Heart disease Mother   . Heart attack Mother   . Stroke Mother   . Depression Mother   . Hypertension Mother   . Cancer Sister        brain  . Hypertension Sister   . Diabetes Brother   . Hypertension Brother   . Heart failure Sister   . Heart attack Sister   . SIDS Sister    Social History  Substance Use Topics  . Smoking status: Former Smoker    Packs/day: 0.25    Years: 20.00    Types: Cigarettes    Quit date: 07/01/2006  . Smokeless tobacco: Never Used  . Alcohol use No   Allergies  Allergen Reactions  . Aspirin Anaphylaxis   Prior to Admission medications   Medication Sig Start Date End Date Taking? Authorizing Provider  ACCU-CHEK AVIVA PLUS test strip  05/19/16  Yes [provider]  albuterol (PROVENTIL HFA;VENTOLIN HFA) 108 (90 Base) MCG/ACT inhaler Inhale 2 puffs into the lungs every 6 (six) hours as needed for wheezing or shortness of breath. 08/30/15  Yes Epifanio Lesches, MD  amLODipine ([place]) 5  MG tablet Take 1 tablet (5 mg total) by mouth daily. 08/14/16  Yes Minna Merritts, MD  atorvastatin (LIPITOR) 40 MG tablet Take 40 mg by mouth daily at 6 PM.  07/10/16  Yes [provider]  Blood Glucose Monitoring Suppl (ACCU-CHEK AVIVA PLUS) w/Device KIT  06/13/16  Yes [provider]  carvedilol (COREG) 3.125 MG tablet Take 1 tablet (3.125 mg total) by mouth 2 (two) times daily with a meal. 08/12/16  Yes Darylene Price A, FNP  cetirizine (ZYRTEC) 10 MG tablet Take 10 mg by mouth daily.   Yes [provider]  citalopram (CELEXA) 20 MG tablet Take 20 mg by mouth daily.   Yes [provider]  clopidogrel (PLAVIX) 75 MG tablet Take 1 tablet (75 mg total) by mouth  daily. 08/14/16  Yes Gollan, Kathlene November, MD  furosemide (LASIX) 40 MG tablet Take 1 tablet (40 mg total) by mouth 2 (two) times daily. 08/12/16  Yes Darylene Price A, FNP  hydrALAZINE (APRESOLINE) 25 MG tablet Take 25 mg by mouth 3 (three) times daily.   Yes [provider]  insulin aspart (NOVOLOG) 100 UNIT/ML injection Inject 5 Units into the skin daily. 10 units in the morning 5 units at lunch   Yes [provider]  insulin glargine (LANTUS) 100 UNIT/ML injection Inject 0.2-0.3 mLs (20-30 Units total) into the skin 2 (two) times daily. Take 20 units in morning and 30 units at night Patient taking differently: Inject 20-30 Units into the skin 2 (two) times daily. 40 units at bedtime 06/18/16 01/27/17 Yes Mody, Ulice Bold, MD  isosorbide mononitrate (IMDUR) 30 MG 24 hr tablet Take 1 tablet (30 mg total) by mouth 2 (two) times daily. 08/12/16  Yes Darylene Price A, FNP  levETIRAcetam (KEPPRA) 750 MG tablet Take 1 tablet (750 mg total) by mouth 2 (two) times daily. 06/05/15  Yes Rai, Ripudeep K, MD  losartan (COZAAR) 100 MG tablet Take 1 tablet (100 mg total) by mouth daily. 08/12/16  Yes Hackney, Otila Kluver A, FNP  meloxicam (MOBIC) 7.5 MG tablet Take 1 tablet (7.5 mg total) by mouth daily. 06/05/15  Yes Rai, Ripudeep K, MD  nitroGLYCERIN (NITROSTAT) 0.4 MG SL tablet Place 1 tablet (0.4 mg total) under the tongue every 5 (five) minutes as needed for chest pain. 07/01/16 07/01/17 Yes Gollan, Kathlene November, MD  omeprazole (PRILOSEC) 40 MG capsule Take 40 mg by mouth daily.   Yes [provider]  potassium chloride SA (K-DUR,KLOR-CON) 20 MEQ tablet Take 1 tablet (20 mEq total) by mouth daily. 08/12/16  Yes Darylene Price A, FNP  rosuvastatin (CRESTOR) 10 MG tablet Take 10 mg by mouth daily.   Yes [provider]  tiotropium (SPIRIVA) 18 MCG inhalation capsule Place 18 mcg into inhaler and inhale daily.   Yes [provider]  traMADol (ULTRAM) 50 MG tablet Take 2 tablets (100 mg total) by mouth  every 6 (six) hours as needed for moderate pain. 01/22/17  Yes Molli Barrows, MD  traZODone (DESYREL) 50 MG tablet Take 50 mg by mouth at bedtime.   Yes [provider]  UNIFINE PENTIPS 31G X 6 MM [place]  07/23/16  Yes [provider]  cyclobenzaprine (FLEXERIL) 5 MG tablet Take 5 mg by mouth 3 (three) times daily as needed.  09/26/16   [provider]           Review of Systems  Constitutional: Positive for fatigue. Negative for appetite change.  HENT: Negative for congestion, postnasal drip  and sore throat.   Eyes: Negative.   Respiratory: Positive for chest tightness (heaviness) and shortness of breath (when lying down flat at night). Negative for cough.   Cardiovascular: Positive for palpitations and leg swelling. Negative for chest pain.  Gastrointestinal: Negative for abdominal distention and abdominal pain.  Endocrine: Negative.   Genitourinary: Negative.   Musculoskeletal: Positive for arthralgias (left shoulder pain) and back pain (chronic). Negative for neck pain.  Skin: Negative.   Allergic/Immunologic: Negative.   Neurological: Positive for light-headedness. Negative for dizziness.  Hematological: Negative for adenopathy. Does not bruise/bleed easily.  Psychiatric/Behavioral: Negative for dysphoric mood, sleep disturbance (sleeping better) and suicidal ideas. The patient is not nervous/anxious.    Vitals:   01/27/17 0840  BP: (!) 144/77  Pulse: (!) 57  Resp: 20  SpO2: 100%  Weight: (!) 319 lb 2 oz (144.8 kg)  Height: _0  (1.803 m)   Wt Readings from Last 3 Encounters:  01/27/17 (!) 319 lb 2 oz (144.8 kg)  01/22/17 (!) 318 lb (144.2 kg)  12/11/16 (!) 309 lb 6 oz (140.3 kg)    Lab Results  Component Value Date   CREATININE 0.75 11/13/2016   CREATININE 0.69 08/14/2016   CREATININE 0.93 08/12/2016   Physical Exam  Constitutional: She is oriented to person, place, and time. She appears well-developed and well-nourished.  HENT:  Head:  Normocephalic and atraumatic.  Neck: Normal range of motion. Neck supple. No JVD present.  Cardiovascular: Regular rhythm.  Bradycardia present.   Pulmonary/Chest: Effort normal. She has no wheezes. She has no rales.  Abdominal: Soft. She exhibits no distension. There is no tenderness.  Musculoskeletal: She exhibits edema (2+ pitting edema in bilateral lower legs). She exhibits no tenderness.  Neurological: She is alert and oriented to person, place, and time.  Skin: Skin is warm and dry.  Psychiatric: She has a normal mood and affect. Her behavior is normal.  Nursing note and vitals reviewed.   Assessment & Plan:  1: Acute on Chronic heart failure with preserved ejection fraction- - NYHA class III - moderately fluid overloaded today - weighing daily and says that her home weight has gradually risen.  Reminded to call for an overnight weight gain of >2 pounds or a weekly weight gain of >5 pounds.  - weight up 10 pounds by our scale since the last time she was here - drinking 128-144 ounces of fluid daily. Discussed the rationale/importance of maintaining a fluid intake of 40-60 ounces of fluid daily.   - saw her cardiologist Rockey Situ) 10/02/16 - avoid taking meloxicam if possible - add metolazone 2.17m daily for 3 days; best to take it 1/2 hour prior to furosemide if possible - increase potassium tablets to 240m twice daily for these next 3 days and then resume 2030mdaily - Pharm D went in and reviewed medications with the patient - was taking both atorvastatin and rosuvastatin. Advised patient to stop the rosuvastatin. - draw a BMP today  2: HTN- - BP looks good today - BMP from 11/13/16 reviewed; potassium 4.0 and GFR >60 - consider stopping amlodipine due to bradycardia; could increase carvedilol if needed for BP control - sees PCP (SoAncil Boozer0/10/18   3: Diabetes-  - glucose at home today was 400 and yesterday it was 360 - sees PCP per above next week  Reviewed medication  bottles with the patient.  Due to scheduling transportation ahead of time, she will return in 6 days for a recheck.

## 2017-01-29 ENCOUNTER — Telehealth: Payer: Self-pay | Admitting: Family

## 2017-01-29 ENCOUNTER — Emergency Department: Payer: Medicare HMO

## 2017-01-29 ENCOUNTER — Encounter: Payer: Self-pay | Admitting: Emergency Medicine

## 2017-01-29 ENCOUNTER — Observation Stay
Admission: EM | Admit: 2017-01-29 | Discharge: 2017-01-30 | Disposition: A | Discharge status: Home / Self Care | Payer: Medicare HMO | Attending: Internal Medicine | Admitting: Internal Medicine

## 2017-01-29 DIAGNOSIS — G40909 Epilepsy, unspecified, not intractable, without status epilepticus: Secondary | ICD-10-CM | POA: Diagnosis not present

## 2017-01-29 DIAGNOSIS — R0789 Other chest pain: Secondary | ICD-10-CM | POA: Diagnosis not present

## 2017-01-29 DIAGNOSIS — N179 Acute kidney failure, unspecified: Secondary | ICD-10-CM | POA: Diagnosis not present

## 2017-01-29 DIAGNOSIS — I209 Angina pectoris, unspecified: Secondary | ICD-10-CM | POA: Diagnosis present

## 2017-01-29 DIAGNOSIS — E785 Hyperlipidemia, unspecified: Secondary | ICD-10-CM | POA: Diagnosis not present

## 2017-01-29 DIAGNOSIS — J449 Chronic obstructive pulmonary disease, unspecified: Secondary | ICD-10-CM | POA: Diagnosis not present

## 2017-01-29 DIAGNOSIS — I110 Hypertensive heart disease with heart failure: Secondary | ICD-10-CM | POA: Insufficient documentation

## 2017-01-29 DIAGNOSIS — Z886 Allergy status to analgesic agent status: Secondary | ICD-10-CM | POA: Diagnosis not present

## 2017-01-29 DIAGNOSIS — F329 Major depressive disorder, single episode, unspecified: Secondary | ICD-10-CM | POA: Diagnosis not present

## 2017-01-29 DIAGNOSIS — Z951 Presence of aortocoronary bypass graft: Secondary | ICD-10-CM | POA: Diagnosis not present

## 2017-01-29 DIAGNOSIS — R079 Chest pain, unspecified: Secondary | ICD-10-CM | POA: Diagnosis present

## 2017-01-29 DIAGNOSIS — Z6841 Body Mass Index (BMI) 40.0 and over, adult: Secondary | ICD-10-CM | POA: Insufficient documentation

## 2017-01-29 DIAGNOSIS — E119 Type 2 diabetes mellitus without complications: Secondary | ICD-10-CM | POA: Insufficient documentation

## 2017-01-29 DIAGNOSIS — Z955 Presence of coronary angioplasty implant and graft: Secondary | ICD-10-CM | POA: Diagnosis not present

## 2017-01-29 DIAGNOSIS — I2510 Atherosclerotic heart disease of native coronary artery without angina pectoris: Secondary | ICD-10-CM | POA: Insufficient documentation

## 2017-01-29 DIAGNOSIS — K449 Diaphragmatic hernia without obstruction or gangrene: Secondary | ICD-10-CM | POA: Diagnosis not present

## 2017-01-29 DIAGNOSIS — Z7902 Long term (current) use of antithrombotics/antiplatelets: Secondary | ICD-10-CM | POA: Insufficient documentation

## 2017-01-29 DIAGNOSIS — Z87891 Personal history of nicotine dependence: Secondary | ICD-10-CM | POA: Diagnosis not present

## 2017-01-29 DIAGNOSIS — I5032 Chronic diastolic (congestive) heart failure: Secondary | ICD-10-CM | POA: Insufficient documentation

## 2017-01-29 DIAGNOSIS — I252 Old myocardial infarction: Secondary | ICD-10-CM | POA: Diagnosis not present

## 2017-01-29 DIAGNOSIS — F419 Anxiety disorder, unspecified: Secondary | ICD-10-CM | POA: Diagnosis not present

## 2017-01-29 DIAGNOSIS — Z79899 Other long term (current) drug therapy: Secondary | ICD-10-CM | POA: Insufficient documentation

## 2017-01-29 DIAGNOSIS — R0600 Dyspnea, unspecified: Secondary | ICD-10-CM | POA: Diagnosis not present

## 2017-01-29 DIAGNOSIS — K219 Gastro-esophageal reflux disease without esophagitis: Secondary | ICD-10-CM | POA: Insufficient documentation

## 2017-01-29 DIAGNOSIS — Z794 Long term (current) use of insulin: Secondary | ICD-10-CM | POA: Insufficient documentation

## 2017-01-29 HISTORY — DX: Unspecified diastolic (congestive) heart failure: I50.30

## 2017-01-29 LAB — BASIC METABOLIC PANEL
Anion gap: 13 (ref 5–15)
BUN: 37 mg/dL — AB (ref 6–20)
CALCIUM: 9.8 mg/dL (ref 8.9–10.3)
CO2: 25 mmol/L (ref 22–32)
CREATININE: 1.34 mg/dL — AB (ref 0.44–1.00)
Chloride: 97 mmol/L — ABNORMAL LOW (ref 101–111)
GFR calc non Af Amer: 39 mL/min — ABNORMAL LOW (ref >60)
GFR, EST AFRICAN AMERICAN: 46 mL/min — AB (ref >60)
Glucose, Bld: 192 mg/dL — ABNORMAL HIGH (ref 65–99)
Potassium: 3.4 mmol/L — ABNORMAL LOW (ref 3.5–5.1)
SODIUM: 135 mmol/L (ref 135–145)

## 2017-01-29 LAB — CBC WITH DIFFERENTIAL/PLATELET
BASOS PCT: 1 %
Basophils Absolute: 0.1 10*3/uL (ref 0–0.1)
EOS ABS: 0.1 10*3/uL (ref 0–0.7)
Eosinophils Relative: 1 %
HCT: 35.5 % (ref 35.0–47.0)
Hemoglobin: 11.6 g/dL — ABNORMAL LOW (ref 12.0–16.0)
LYMPHS ABS: 3.3 10*3/uL (ref 1.0–3.6)
Lymphocytes Relative: 30 %
MCH: 26.9 pg (ref 26.0–34.0)
MCHC: 32.8 g/dL (ref 32.0–36.0)
MCV: 81.8 fL (ref 80.0–100.0)
MONO ABS: 0.9 10*3/uL (ref 0.2–0.9)
MONOS PCT: 8 %
Neutro Abs: 6.6 10*3/uL — ABNORMAL HIGH (ref 1.4–6.5)
Neutrophils Relative %: 60 %
Platelets: 343 10*3/uL (ref 150–440)
RBC: 4.34 MIL/uL (ref 3.80–5.20)
RDW: 14.5 % (ref 11.5–14.5)
WBC: 11.1 10*3/uL — ABNORMAL HIGH (ref 3.6–11.0)

## 2017-01-29 LAB — GLUCOSE, CAPILLARY: Glucose-Capillary: 196 mg/dL — ABNORMAL HIGH (ref 65–99)

## 2017-01-29 LAB — BRAIN NATRIURETIC PEPTIDE: B NATRIURETIC PEPTIDE 5: 38 pg/mL (ref 0.0–100.0)

## 2017-01-29 LAB — TROPONIN I: Troponin I: <0.03 ng/mL (ref <0.03)

## 2017-01-29 MED ORDER — CLOPIDOGREL BISULFATE 75 MG PO TABS: 75.0000 mg | ORAL_TABLET | Freq: Every day | ORAL | Status: DC

## 2017-01-29 MED ORDER — AMLODIPINE BESYLATE 5 MG PO TABS
5.0000 mg | ORAL_TABLET | Freq: Every day | ORAL | Status: DC
  Administered 2017-01-29 – 2017-01-30 (×2): 5 mg via ORAL
  Filled 2017-01-29 (×2): qty 1

## 2017-01-29 MED ORDER — ENOXAPARIN SODIUM 40 MG/0.4ML ~~LOC~~ SOLN
40.0000 mg | Freq: Two times a day (BID) | SUBCUTANEOUS | Status: DC
  Administered 2017-01-29 – 2017-01-30 (×2): 40 mg via SUBCUTANEOUS
  Filled 2017-01-29 (×2): qty 0.4

## 2017-01-29 MED ORDER — PANTOPRAZOLE SODIUM 40 MG PO TBEC
40.0000 mg | DELAYED_RELEASE_TABLET | Freq: Every day | ORAL | Status: DC
  Administered 2017-01-29 – 2017-01-30 (×2): 40 mg via ORAL
  Filled 2017-01-29 (×2): qty 1

## 2017-01-29 MED ORDER — FENTANYL CITRATE (PF) 100 MCG/2ML IJ SOLN
25.0000 ug | Freq: Once | INTRAMUSCULAR | Status: AC
  Administered 2017-01-29: 25 ug via INTRAVENOUS
  Filled 2017-01-29: qty 2

## 2017-01-29 MED ORDER — METHYLPREDNISOLONE SODIUM SUCC 40 MG IJ SOLR
40.0000 mg | Freq: Once | INTRAMUSCULAR | Status: AC
  Administered 2017-01-29: 40 mg via INTRAVENOUS
  Filled 2017-01-29: qty 1

## 2017-01-29 MED ORDER — CYCLOBENZAPRINE HCL 10 MG PO TABS
5.0000 mg | ORAL_TABLET | Freq: Two times a day (BID) | ORAL | Status: DC
  Administered 2017-01-29 – 2017-01-30 (×2): 5 mg via ORAL
  Filled 2017-01-29 (×2): qty 1

## 2017-01-29 MED ORDER — INSULIN GLARGINE 100 UNIT/ML ~~LOC~~ SOLN
40.0000 [IU] | Freq: Every day | SUBCUTANEOUS | Status: DC
  Administered 2017-01-29: 40 [IU] via SUBCUTANEOUS
  Filled 2017-01-29 (×2): qty 0.4

## 2017-01-29 MED ORDER — NITROGLYCERIN 0.4 MG SL SUBL: 0.4000 mg | SUBLINGUAL_TABLET | SUBLINGUAL | Status: DC | PRN

## 2017-01-29 MED ORDER — FERROUS SULFATE 325 (65 FE) MG PO TABS
325.0000 mg | ORAL_TABLET | Freq: Three times a day (TID) | ORAL | Status: DC
  Administered 2017-01-30 (×2): 325 mg via ORAL
  Filled 2017-01-29 (×2): qty 1

## 2017-01-29 MED ORDER — ROSUVASTATIN CALCIUM 10 MG PO TABS
10.0000 mg | ORAL_TABLET | Freq: Every day | ORAL | Status: DC
  Administered 2017-01-29 – 2017-01-30 (×2): 10 mg via ORAL
  Filled 2017-01-29 (×2): qty 1

## 2017-01-29 MED ORDER — INSULIN ASPART 100 UNIT/ML ~~LOC~~ SOLN: 0.0000 [IU] | Freq: Every day | SUBCUTANEOUS | Status: DC

## 2017-01-29 MED ORDER — LOSARTAN POTASSIUM 50 MG PO TABS
100.0000 mg | ORAL_TABLET | Freq: Every day | ORAL | Status: DC
  Administered 2017-01-30: 100 mg via ORAL
  Filled 2017-01-29: qty 2

## 2017-01-29 MED ORDER — TRAZODONE HCL 50 MG PO TABS
50.0000 mg | ORAL_TABLET | Freq: Every day | ORAL | Status: DC
  Administered 2017-01-29: 50 mg via ORAL
  Filled 2017-01-29: qty 1

## 2017-01-29 MED ORDER — IPRATROPIUM-ALBUTEROL 0.5-2.5 (3) MG/3ML IN SOLN: 3.0000 mL | Freq: Four times a day (QID) | RESPIRATORY_TRACT | Status: DC | PRN

## 2017-01-29 MED ORDER — LEVETIRACETAM 750 MG PO TABS
750.0000 mg | ORAL_TABLET | Freq: Two times a day (BID) | ORAL | Status: DC
  Administered 2017-01-29 – 2017-01-30 (×2): 750 mg via ORAL
  Filled 2017-01-29 (×3): qty 1

## 2017-01-29 MED ORDER — ONDANSETRON HCL 4 MG/2ML IJ SOLN
4.0000 mg | Freq: Once | INTRAMUSCULAR | Status: AC
  Administered 2017-01-29: 4 mg via INTRAVENOUS
  Filled 2017-01-29: qty 2

## 2017-01-29 MED ORDER — ACETAMINOPHEN 325 MG PO TABS: 650.0000 mg | ORAL_TABLET | Freq: Four times a day (QID) | ORAL | Status: DC | PRN

## 2017-01-29 MED ORDER — ISOSORBIDE MONONITRATE ER 60 MG PO TB24
30.0000 mg | ORAL_TABLET | Freq: Two times a day (BID) | ORAL | Status: DC
  Administered 2017-01-29 – 2017-01-30 (×2): 30 mg via ORAL
  Filled 2017-01-29 (×2): qty 1

## 2017-01-29 MED ORDER — POTASSIUM CHLORIDE CRYS ER 20 MEQ PO TBCR
40.0000 meq | EXTENDED_RELEASE_TABLET | Freq: Every day | ORAL | Status: DC
  Administered 2017-01-29 – 2017-01-30 (×2): 40 meq via ORAL
  Filled 2017-01-29 (×2): qty 2

## 2017-01-29 MED ORDER — INSULIN ASPART 100 UNIT/ML ~~LOC~~ SOLN
0.0000 [IU] | Freq: Three times a day (TID) | SUBCUTANEOUS | Status: DC
  Administered 2017-01-30: 11 [IU] via SUBCUTANEOUS
  Administered 2017-01-30: 5 [IU] via SUBCUTANEOUS
  Filled 2017-01-29 (×2): qty 1

## 2017-01-29 MED ORDER — TIOTROPIUM BROMIDE MONOHYDRATE 18 MCG IN CAPS
18.0000 ug | ORAL_CAPSULE | Freq: Every day | RESPIRATORY_TRACT | Status: DC
  Filled 2017-01-29: qty 5

## 2017-01-29 MED ORDER — CITALOPRAM HYDROBROMIDE 20 MG PO TABS
20.0000 mg | ORAL_TABLET | Freq: Every day | ORAL | Status: DC
  Administered 2017-01-29 – 2017-01-30 (×2): 20 mg via ORAL
  Filled 2017-01-29 (×2): qty 1

## 2017-01-29 MED ORDER — CARVEDILOL 6.25 MG PO TABS
3.1250 mg | ORAL_TABLET | Freq: Two times a day (BID) | ORAL | Status: DC
  Administered 2017-01-29 – 2017-01-30 (×2): 3.125 mg via ORAL
  Filled 2017-01-29 (×2): qty 1

## 2017-01-29 MED ORDER — CALCIUM CARBONATE-VITAMIN D 500-200 MG-UNIT PO TABS
1.0000 | ORAL_TABLET | Freq: Two times a day (BID) | ORAL | Status: DC
  Administered 2017-01-29 – 2017-01-30 (×2): 1 via ORAL
  Filled 2017-01-29 (×2): qty 1

## 2017-01-29 MED ORDER — TRAMADOL HCL 50 MG PO TABS
100.0000 mg | ORAL_TABLET | Freq: Four times a day (QID) | ORAL | Status: DC | PRN
  Administered 2017-01-29: 100 mg via ORAL
  Filled 2017-01-29: qty 2

## 2017-01-29 MED ORDER — FUROSEMIDE 40 MG PO TABS
80.0000 mg | ORAL_TABLET | Freq: Every day | ORAL | Status: DC
  Administered 2017-01-29 – 2017-01-30 (×2): 80 mg via ORAL
  Filled 2017-01-29 (×2): qty 2

## 2017-01-29 MED ORDER — LORATADINE 10 MG PO TABS
10.0000 mg | ORAL_TABLET | Freq: Every day | ORAL | Status: DC
  Administered 2017-01-29 – 2017-01-30 (×2): 10 mg via ORAL
  Filled 2017-01-29 (×2): qty 1

## 2017-01-29 MED ORDER — ACETAMINOPHEN 650 MG RE SUPP: 650.0000 mg | Freq: Four times a day (QID) | RECTAL | Status: DC | PRN

## 2017-01-29 NOTE — Progress Notes (Signed)
Chaplain received an order to visit with pt in room 238. Pt was having problems breathing. Pt has a strong network of people to lean on during this time in her life. Chaplain provided [employer] of prayer and a pastoral presence.     01/29/17 2009  Clinical Encounter Type  Visited With Patient  Visit Type Initial;Spiritual support  Referral From Nurse  Consult/Referral To Chaplain  Spiritual Encounters  Spiritual Needs Prayer

## 2017-01-29 NOTE — H&P (Signed)
[facility] NAME: Tina Hayes    MR#:  [id number]  DATE OF BIRTH:  11/20/1945  DATE OF ADMISSION:  01/29/2017  PRIMARY CARE PHYSICIAN: Herminio Commons, MD   REQUESTING/REFERRING PHYSICIAN: Dr Larae Grooms  CHIEF COMPLAINT:   Chief Complaint  Patient presents with  . Chest Pain    HISTORY OF PRESENT ILLNESS:  Tina Hayes  is a 71 y.o. female with a known history of CAD presents with chest pain. She states the chest pain had started on Tuesday. She states she can't catch her breath especially with moving around. She's been sweating. She had pain radiate to her left arm. She is unable to lie flat. She chokes when she lies flat.  Nausea vomiting this a.m. Blood sugars of been elevated. She stated that [facility] boosted her water pill and gave some Zaroxolyn for last couple days. Still having chest pain continuous since Tuesday. ER physician thought the patient needed to be observed overnight. First troponin negative. Patient had a stress test within the year that was an intermediate study. Chest x-ray was negative.  PAST MEDICAL HISTORY:   Past Medical History:  Diagnosis Date  . Anxiety   . CHF (congestive heart failure) ([place])   . Chronic back pain   . COPD (chronic obstructive pulmonary disease) ([place])   . Coronary artery disease   . Depression   . Diabetes mellitus without complication ([place])   . H/O heart artery stent 06/17/2016   5 stents per patient  . Heart attack ([place])    Total of 3 per pt.  . Hypertension   . Seizures ([place])     PAST SURGICAL HISTORY:   Past Surgical History:  Procedure Laterality Date  . ANKLE SURGERY    . CORONARY ARTERY BYPASS GRAFT    . KNEE SURGERY      SOCIAL HISTORY:   Social History  Substance Use Topics  . Smoking status: Former Smoker    Packs/day: 0.25    Years: 20.00    Types: Cigarettes    Quit date: 07/01/2006  . Smokeless tobacco: Never Used  . Alcohol use  No    FAMILY HISTORY:   Family History  Problem Relation Age of Onset  . Diabetes Unknown   . Hypertension Unknown   . Diabetes Mother   . Heart failure Mother   . Heart disease Mother   . Heart attack Mother   . Stroke Mother   . Depression Mother   . Hypertension Mother   . Cancer Sister        brain  . Hypertension Sister   . Diabetes Brother   . Hypertension Brother   . Heart failure Sister   . Heart attack Sister   . SIDS Sister     DRUG ALLERGIES:   Allergies  Allergen Reactions  . Aspirin Anaphylaxis    REVIEW OF SYSTEMS:  CONSTITUTIONAL: No fever. Positive for chills and sweats. Positive for fatigue. Positive left-sided headache. Positive for dizziness EYES: Positive for blurred vision positive for eye swollen EARS, NOSE, AND THROAT: No tinnitus or ear pain. No sore throat. Positive for decreased hearing. Positive for dysphagia to solids RESPIRATORY: No cough. Positive for shortness of breath and wheezing. No hemoptysis.  CARDIOVASCULAR: Positive for chest pain, positive for orthopnea.  GASTROINTESTINAL: Positive for nausea, vomiting, diarrhea and abdominal pain. No blood in bowel movements GENITOURINARY: Occasional dysuria ENDOCRINE: No polyuria, nocturia,  HEMATOLOGY: No anemia, easy bruising or  bleeding SKIN: No rash or lesion. MUSCULOSKELETAL: Positive for joint pain NEUROLOGIC: Seizure 2 weeks ago PSYCHIATRY: No anxiety or depression.   MEDICATIONS AT HOME:   Prior to Admission medications   Medication Sig Start Date End Date Taking? Authorizing Provider  ACCU-CHEK AVIVA PLUS test strip  05/19/16  Yes [provider]  albuterol (PROVENTIL HFA;VENTOLIN HFA) 108 (90 Base) MCG/ACT inhaler Inhale 2 puffs into the lungs every 6 (six) hours as needed for wheezing or shortness of breath. 08/30/15  Yes Epifanio Lesches, MD  amLODipine (NORVASC) 5 MG tablet Take 1 tablet (5 mg total) by mouth daily. 08/14/16  Yes Minna Merritts, MD  Blood Glucose  Monitoring Suppl (ACCU-CHEK AVIVA PLUS) w/Device KIT  06/13/16  Yes [provider]  Calcium Carbonate-Vitamin D3 (CALCIUM 600-D) 600-400 MG-UNIT TABS Take 1 tablet by mouth 2 (two) times daily.   Yes [provider]  carvedilol (COREG) 3.125 MG tablet Take 1 tablet (3.125 mg total) by mouth 2 (two) times daily with a meal. 08/12/16  Yes Hackney, Tina A, FNP  cetirizine (ZYRTEC) 10 MG tablet Take 10 mg by mouth daily.   Yes [provider]  citalopram (CELEXA) 20 MG tablet Take 20 mg by mouth daily.   Yes [provider]  clopidogrel (PLAVIX) 75 MG tablet Take 1 tablet (75 mg total) by mouth daily. 08/14/16  Yes Gollan, Kathlene November, MD  cyclobenzaprine (FLEXERIL) 5 MG tablet Take 5 mg by mouth 2 (two) times daily.  09/26/16  Yes [provider]  ferrous sulfate 325 (65 FE) MG tablet Take 325 mg by mouth 3 (three) times daily with meals.   Yes [provider]  furosemide (LASIX) 40 MG tablet Take 1 tablet (40 mg total) by mouth 2 (two) times daily. Patient taking differently: Take 80 mg by mouth daily.  08/12/16  Yes Hackney, Otila Kluver A, FNP  insulin aspart (NOVOLOG) 100 UNIT/ML injection Inject 5-10 Units into the skin 2 (two) times daily. 10 units in the morning 5 units at lunch   Yes [provider]  insulin glargine (LANTUS) 100 UNIT/ML injection Inject 0.2-0.3 mLs (20-30 Units total) into the skin 2 (two) times daily. Take 20 units in morning and 30 units at night Patient taking differently: Inject 20-30 Units into the skin 2 (two) times daily. 40 units at bedtime 06/18/16 01/29/18 Yes Mody, Sital, MD  ipratropium-albuterol (DUONEB) 0.5-2.5 (3) MG/3ML SOLN Take 3 mLs by nebulization every 6 (six) hours as needed (COPD).   Yes [provider]  isosorbide mononitrate (IMDUR) 30 MG 24 hr tablet Take 1 tablet (30 mg total) by mouth 2 (two) times daily. 08/12/16  Yes Darylene Price A, FNP  levETIRAcetam (KEPPRA) 750 MG tablet Take 1 tablet (750 mg  total) by mouth 2 (two) times daily. 06/05/15  Yes Rai, Ripudeep K, MD  losartan (COZAAR) 100 MG tablet Take 1 tablet (100 mg total) by mouth daily. 08/12/16  Yes Hackney, Otila Kluver A, FNP  meloxicam (MOBIC) 7.5 MG tablet Take 1 tablet (7.5 mg total) by mouth daily. 06/05/15  Yes Rai, Ripudeep K, MD  nitroGLYCERIN (NITROSTAT) 0.4 MG SL tablet Place 1 tablet (0.4 mg total) under the tongue every 5 (five) minutes as needed for chest pain. 07/01/16 07/01/17 Yes Gollan, Kathlene November, MD  omeprazole (PRILOSEC) 40 MG capsule Take 40 mg by mouth daily.   Yes [provider]  potassium chloride SA (K-DUR,KLOR-CON) 20 MEQ tablet Take 1 tablet (20 mEq total) by mouth daily. Patient taking differently:  Take 40 mEq by mouth daily.  08/12/16  Yes Darylene Price A, FNP  rosuvastatin (CRESTOR) 10 MG tablet Take 10 mg by mouth daily.   Yes [provider]  tiotropium (SPIRIVA) 18 MCG inhalation capsule Place 18 mcg into inhaler and inhale daily.   Yes [provider]  traMADol (ULTRAM) 50 MG tablet Take 2 tablets (100 mg total) by mouth every 6 (six) hours as needed for moderate pain. 01/22/17  Yes Molli Barrows, MD  traZODone (DESYREL) 50 MG tablet Take 50 mg by mouth at bedtime.   Yes [provider]  UNIFINE PENTIPS 31G X 6 MM [place]  07/23/16  Yes [provider]      VITAL SIGNS:  Height '5\' 11"'  (1.803 m), weight (!) 139.2 kg (306 lb 14.1 oz).  PHYSICAL EXAMINATION:  GENERAL:  71 y.o.-year-old patient lying in the bed with no acute distress.  EYES: Pupils equal, round, reactive to light and accommodation. No scleral icterus. Extraocular muscles intact.  HEENT: Head atraumatic, normocephalic. Oropharynx and nasopharynx clear.  NECK:  Supple, no jugular venous distention. No thyroid enlargement, no tenderness.  LUNGS: Normal breath sounds bilaterally, no wheezing, rales,rhonchi or crepitation. No use of accessory muscles of respiration.  CARDIOVASCULAR: S1, S2 normal. No murmurs,  rubs, or gallops. Reproducible chest pain to palpation ABDOMEN: Soft, nontender, nondistended. Bowel sounds present. No organomegaly or mass.  EXTREMITIES: Trace edema, no cyanosis, or clubbing.  NEUROLOGIC: Cranial nerves II through XII are intact. Muscle strength 5/5 in all extremities. Sensation intact. Gait not checked.  PSYCHIATRIC: The patient is alert and oriented x 3.  SKIN: No rash, lesion, or ulcer.   LABORATORY PANEL:   CBC  Recent Labs Lab 01/29/17 1553  WBC 11.1*  HGB 11.6*  HCT 35.5  PLT 343   ------------------------------------------------------------------------------------------------------------------  Chemistries   Recent Labs Lab 01/29/17 1553  NA 135  K 3.4*  CL 97*  CO2 25  GLUCOSE 192*  BUN 37*  CREATININE 1.34*  CALCIUM 9.8   ------------------------------------------------------------------------------------------------------------------  Cardiac Enzymes  Recent Labs Lab 01/29/17 1553  TROPONINI <0.03   ------------------------------------------------------------------------------------------------------------------  RADIOLOGY:  Dg Chest 1 View  Result Date: 01/29/2017 CLINICAL DATA:  Chest pressure EXAM: CHEST 1 VIEW COMPARISON:  11/13/2016 FINDINGS: Post sternotomy changes. Faintly visualized coronary stent or calcification. Mildly low lung volumes. Atelectasis at the left base. Moderate hiatal hernia. Mild cardiomegaly. Negative for acute infiltrate, pleural effusion or pneumothorax IMPRESSION: 1. Mild cardiomegaly 2. Mild atelectasis at the left base 3. Hiatal hernia Electronically Signed   By: Donavan Foil M.D.   On: 01/29/2017 15:04    EKG:   Normal sinus rhythm 63 bpm  IMPRESSION AND PLAN:   1. Chest pain. Could be costochondritis with reproducible chest pain with palpation. With her history of coronary artery disease I will observe overnight on telemetry and check serial cardiac enzymes. Cardiology consultation in the a.m.  Since the patient had a stress test within the year, I will not repeat this test. The study was listed as an intermediate study on 06/18/2016. Patient has an allergy to aspirin. Patient on Plavix and Coreg. 2. Type 2 diabetes mellitus. Sugars will likely be high with Solu-Medrol. Continue Lantus 40 units at night and sliding scale insulin 3. Morbid obesity weight loss needed 4. Asthma COPD. Respiratory status stable continue nebulizer treatments and inhalers. 5. Essential hypertension continue usual medications 6. Seizure disorder on Keppra 7. Hyperlipidemia unspecified on Crestor 8. GERD and hiatal hernia on omeprazole as outpatient  All the records are reviewed and case discussed with ED provider. Management plans discussed with the patient, family and they are in agreement.  CODE STATUS: Full code  TOTAL TIME TAKING CARE OF THIS PATIENT: 50 minutes.    Loletha Grayer M.D on 01/29/2017 at 5:53 PM  Between 7am to 6pm - Pager - [phone]  After 6pm call admission pager [phone]  [facility] Office  [phone]  CC: Primary care physician; Herminio Commons, MD

## 2017-01-29 NOTE — ED Notes (Signed)
Admitting MD at bedside.

## 2017-01-29 NOTE — Progress Notes (Signed)
Anticoagulation monitoring(Lovenox):  71 yo female ordered Lovenox 40 mg Q24h  Filed Weights   01/29/17 1424  Weight: (!) 306 lb 14.1 oz (139.2 kg)   BMI    Lab Results  Component Value Date   CREATININE 1.34 (H) 01/29/2017   CREATININE 0.73 01/27/2017   CREATININE 0.75 11/13/2016   Estimated Creatinine Clearance: 61.4 mL/min (A) (by C-G formula based on SCr of 1.34 mg/dL (H)). Hemoglobin & Hematocrit     Component Value Date/Time   HGB 11.6 (L) 01/29/2017 1553   HCT 35.5 01/29/2017 1553     Per Protocol for Patient with estCrcl > 30 ml/min and BMI > 40, will transition to Lovenox 40 mg Q12h.

## 2017-01-29 NOTE — Telephone Encounter (Signed)
Patient called to say that she thought she may have had a heart attack at 4am this morning. She says that she woke up with sudden chest pain, sweating and pain radiating down her left arm. She says that now (1:14pm) she is having a really difficult time with her breathing.   She is unable to get to the office as she comes with transportation. Advised patient that based on her symptoms, it would be best for her to call 911 and be brought to the hospital for an evaluation. Patient says that she will call 911 when she hangs up the phone with [place].

## 2017-01-29 NOTE — ED Provider Notes (Signed)
[facility] Emergency Department Provider Note  ____________________________________________   First MD Initiated Contact with Patient 01/29/17 1425     (approximate)  I have reviewed the triage vital signs and the nursing notes.   HISTORY  Chief Complaint Chest Pain   HPI Tina Hayes is a 71 y.o. female with a history of CHF, COPD and triple bypass secondary to coronary artery disease was present. Emergency department today with central chest pain which is a 9 out of 10 and radiates from the upper chest and lower chest. No radiation of the back, arms or jaw. Associated with shortness of breath. Patient says the symptoms have been ongoing since this past Tuesday. Was started on new "fluid pill" at that time and says that it has decreased swelling in her lower extremities but has not improved her shortness of breath or chest pressure. She says that her skirt symptoms feel similar to her previous MIs. Was brought in by EMS who showed a reassuring EKG without STEMI. Patient says that she has an aspirin allergy but she has taken her Plavix today and is compliant with her Plavix.   Past Medical History:  Diagnosis Date  . Anxiety   . CHF (congestive heart failure) ([place])   . Chronic back pain   . COPD (chronic obstructive pulmonary disease) ([place])   . Coronary artery disease   . Depression   . Diabetes mellitus without complication ([place])   . H/O heart artery stent 06/17/2016   5 stents per patient  . Heart attack ([place])    Total of 3 per pt.  . Hypertension   . Seizures [facility])     Patient Active Problem List   Diagnosis Date Noted  . Acute diastolic heart failure ([place]) 01/27/2017  . Depression 12/11/2016  . Essential hypertension 06/30/2016  . Snoring 06/30/2016  . Diabetes mellitus ([place]) 06/30/2016  . CHF (congestive heart failure) ([place]) 06/18/2016  . Coronary artery disease of native artery of native heart with stable angina pectoris ( [place]) 06/17/2016    . Morbid obesity ([place]) 06/17/2016  . Chest pain 06/16/2016  . COPD (chronic obstructive pulmonary disease) ([place]) 08/28/2015  . Hypertensive urgency 06/03/2015    Past Surgical History:  Procedure Laterality Date  . ANKLE SURGERY    . CORONARY ARTERY BYPASS GRAFT    . KNEE SURGERY      Prior to Admission medications   Medication Sig Start Date End Date Taking? Authorizing Provider  ACCU-CHEK AVIVA PLUS test strip  05/19/16   [provider]  albuterol (PROVENTIL HFA;VENTOLIN HFA) 108 (90 Base) MCG/ACT inhaler Inhale 2 puffs into the lungs every 6 (six) hours as needed for wheezing or shortness of breath. 08/30/15   Epifanio Lesches, MD  amLODipine (NORVASC) 5 MG tablet Take 1 tablet (5 mg total) by mouth daily. 08/14/16   Minna Merritts, MD  atorvastatin (LIPITOR) 40 MG tablet Take 40 mg by mouth daily at 6 PM.  07/10/16   [provider]  Blood Glucose Monitoring Suppl (ACCU-CHEK AVIVA PLUS) w/Device KIT  06/13/16   [provider]  carvedilol (COREG) 3.125 MG tablet Take 1 tablet (3.125 mg total) by mouth 2 (two) times daily with a meal. 08/12/16   Alisa Graff, FNP  cetirizine (ZYRTEC) 10 MG tablet Take 10 mg by mouth daily.    [provider]  citalopram (CELEXA) 20 MG tablet Take 20 mg by mouth daily.    [provider]  clopidogrel (PLAVIX) 75 MG tablet  Take 1 tablet (75 mg total) by mouth daily. 08/14/16   Minna Merritts, MD  cyclobenzaprine (FLEXERIL) 5 MG tablet Take 5 mg by mouth 3 (three) times daily as needed.  09/26/16   [provider]  furosemide (LASIX) 40 MG tablet Take 1 tablet (40 mg total) by mouth 2 (two) times daily. 08/12/16   Alisa Graff, FNP  hydrALAZINE (APRESOLINE) 25 MG tablet Take 25 mg by mouth 3 (three) times daily.    [provider]  insulin aspart (NOVOLOG) 100 UNIT/ML injection Inject 5 Units into the skin daily. 10 units in the morning 5 units at lunch    [provider]   insulin glargine (LANTUS) 100 UNIT/ML injection Inject 0.2-0.3 mLs (20-30 Units total) into the skin 2 (two) times daily. Take 20 units in morning and 30 units at night Patient taking differently: Inject 20-30 Units into the skin 2 (two) times daily. 40 units at bedtime 06/18/16 01/27/17  Bettey Costa, MD  isosorbide mononitrate (IMDUR) 30 MG 24 hr tablet Take 1 tablet (30 mg total) by mouth 2 (two) times daily. 08/12/16   Alisa Graff, FNP  levETIRAcetam (KEPPRA) 750 MG tablet Take 1 tablet (750 mg total) by mouth 2 (two) times daily. 06/05/15   Rai, Vernelle Emerald, MD  losartan (COZAAR) 100 MG tablet Take 1 tablet (100 mg total) by mouth daily. 08/12/16   Alisa Graff, FNP  meloxicam (MOBIC) 7.5 MG tablet Take 1 tablet (7.5 mg total) by mouth daily. 06/05/15   Rai, Vernelle Emerald, MD  metolazone (ZAROXOLYN) 2.5 MG tablet Take 1 tablet (2.5 mg total) by mouth daily. 01/27/17 01/30/17  Alisa Graff, FNP  nitroGLYCERIN (NITROSTAT) 0.4 MG SL tablet Place 1 tablet (0.4 mg total) under the tongue every 5 (five) minutes as needed for chest pain. 07/01/16 07/01/17  Minna Merritts, MD  omeprazole (PRILOSEC) 40 MG capsule Take 40 mg by mouth daily.    [provider]  potassium chloride SA (K-DUR,KLOR-CON) 20 MEQ tablet Take 1 tablet (20 mEq total) by mouth daily. 08/12/16   Alisa Graff, FNP  tiotropium (SPIRIVA) 18 MCG inhalation capsule Place 18 mcg into inhaler and inhale daily.    [provider]  traMADol (ULTRAM) 50 MG tablet Take 2 tablets (100 mg total) by mouth every 6 (six) hours as needed for moderate pain. 01/22/17   Molli Barrows, MD  traZODone (DESYREL) 50 MG tablet Take 50 mg by mouth at bedtime.    [provider]  UNIFINE PENTIPS 31G X 6 MM [place]  07/23/16   [provider]    Allergies Aspirin  Family History  Problem Relation Age of Onset  . Diabetes Unknown   . Hypertension Unknown   . Diabetes Mother   . Heart failure Mother   . Heart disease Mother    . Heart attack Mother   . Stroke Mother   . Depression Mother   . Hypertension Mother   . Cancer Sister        brain  . Hypertension Sister   . Diabetes Brother   . Hypertension Brother   . Heart failure Sister   . Heart attack Sister   . SIDS Sister     Social History Social History  Substance Use Topics  . Smoking status: Former Smoker    Packs/day: 0.25    Years: 20.00    Types: Cigarettes    Quit date: 07/01/2006  . Smokeless tobacco: Never Used  .  Alcohol use No    Review of Systems  Constitutional: No fever/chills Eyes: No visual changes. ENT: No sore throat. Cardiovascular: as above Respiratory: as abvoe Gastrointestinal: No abdominal pain.  No nausea, no vomiting.  No diarrhea.  No constipation. Genitourinary: Negative for dysuria. Musculoskeletal: Negative for back pain. Skin: Negative for rash. Neurological: Negative for headaches, focal weakness or numbness.   ____________________________________________   PHYSICAL EXAM:  VITAL SIGNS: ED Triage Vitals [01/29/17 1423]  Enc Vitals Group     BP      Pulse      Resp      Temp      Temp src      SpO2      Weight      Height      Head Circumference      Peak Flow      Pain Score 10     Pain Loc      Pain Edu?      Excl. in [place]?     Constitutional: Alert and oriented. Dyspneic with labored respirations. Eyes: Conjunctivae are normal.  Head: Atraumatic. Nose: No congestion/rhinnorhea. Mouth/Throat: Mucous membranes are moist.  Neck: No stridor.   Cardiovascular: Normal rate, regular rhythm. Grossly normal heart sounds.  Good peripheral circulation. Respiratory: Tachypnea.  No retractions. Lungs CTAB. Gastrointestinal: Soft and nontender. No distention. No CVA tenderness. Musculoskeletal: No lower extremity tenderness nor edema.  No joint effusions. Neurologic:  Normal speech and language. No gross focal neurologic deficits are appreciated. Skin:  Skin is warm, dry and intact. No rash  noted. Psychiatric: Mood and affect are normal. Speech and behavior are normal.  ____________________________________________   LABS (all labs ordered are listed, but only abnormal results are displayed)  Labs Reviewed  CBC WITH DIFFERENTIAL/PLATELET  BASIC METABOLIC PANEL  TROPONIN I   ____________________________________________  EKG  ED ECG REPORT I, Tametra Ahart,  Youlanda Roys, the attending physician, personally viewed and interpreted this ECG.   Date: 01/29/2017  EKG Time: 1438  Rate: 63  Rhythm: normal sinus rhythm with PVCs  Axis: Normal  Intervals:none  ST&T Change: No ST segment elevation or depression. T-wave inversions in 1 and aVL. When compared with EKG of 12/11/2016 the patient now has upright T waves in V4 through V6. Previously inverted T waves in 1 and aVL are unchanged.  ____________________________________________  RADIOLOGY  No acute process ____________________________________________   PROCEDURES  Procedure(s) performed:   Procedures  Critical Care performed:   ____________________________________________   INITIAL IMPRESSION / ASSESSMENT AND PLAN / ED COURSE  Pertinent labs & imaging results that were available during my care of the patient were reviewed by me and considered in my medical decision making (see chart for details).  DDX: Unstable angina, ACS/and STEMI, PE, pneumothorax, CHF, pneumonia  ----------------------------------------- 5:24 PM on 01/29/2017 -----------------------------------------  Patient without any distress. Shortness of breath and labored breathing has stopped. However, despite pain meds she states is still saying that she is having 9 out of 10 chest pain.  Patient with multiple risk factors for cardiac disease. Patient says that the pain is worse with exertion. Feel that the patient's pain is more consistent with cardiac cause then PE. Especially with the exertional component and stating this feels like previous  cardiac issues. Patient be admitted to the hospital. Signed out to Dr. Earleen Newport.       ____________________________________________   FINAL CLINICAL IMPRESSION(S) / ED DIAGNOSES  Chest pain. Shortness of breath.    NEW MEDICATIONS STARTED DURING THIS  VISIT:  New Prescriptions   No medications on file     Note:  This document was prepared using Dragon voice recognition software and may include unintentional dictation errors.     Orbie Pyo, MD 01/29/17 1728

## 2017-01-29 NOTE — ED Triage Notes (Signed)
Pt states cp sob since Monday. Saw her cardiologist on tues and put on extra fluid pill for 3 days. Down from 319 to 306 today but states no relief.

## 2017-01-30 ENCOUNTER — Encounter: Payer: Self-pay | Admitting: Nurse Practitioner

## 2017-01-30 ENCOUNTER — Telehealth: Payer: Self-pay | Admitting: Cardiovascular Disease

## 2017-01-30 DIAGNOSIS — R1310 Dysphagia, unspecified: Secondary | ICD-10-CM

## 2017-01-30 DIAGNOSIS — K219 Gastro-esophageal reflux disease without esophagitis: Secondary | ICD-10-CM

## 2017-01-30 DIAGNOSIS — R112 Nausea with vomiting, unspecified: Secondary | ICD-10-CM

## 2017-01-30 DIAGNOSIS — I25118 Atherosclerotic heart disease of native coronary artery with other forms of angina pectoris: Secondary | ICD-10-CM | POA: Diagnosis not present

## 2017-01-30 DIAGNOSIS — R0789 Other chest pain: Secondary | ICD-10-CM

## 2017-01-30 DIAGNOSIS — R0600 Dyspnea, unspecified: Secondary | ICD-10-CM | POA: Diagnosis not present

## 2017-01-30 LAB — CBC
HCT: 35.7 % (ref 35.0–47.0)
Hemoglobin: 11.9 g/dL — ABNORMAL LOW (ref 12.0–16.0)
MCH: 27.4 pg (ref 26.0–34.0)
MCHC: 33.4 g/dL (ref 32.0–36.0)
MCV: 81.8 fL (ref 80.0–100.0)
PLATELETS: 302 10*3/uL (ref 150–440)
RBC: 4.36 MIL/uL (ref 3.80–5.20)
RDW: 14.7 % — ABNORMAL HIGH (ref 11.5–14.5)
WBC: 8.1 10*3/uL (ref 3.6–11.0)

## 2017-01-30 LAB — HEMOGLOBIN A1C
HEMOGLOBIN A1C: 8.5 % — AB (ref 4.8–5.6)
MEAN PLASMA GLUCOSE: 197.25 mg/dL

## 2017-01-30 LAB — LIPID PANEL
CHOL/HDL RATIO: 2.6 ratio
CHOLESTEROL: 120 mg/dL (ref 0–200)
HDL: 47 mg/dL (ref >40)
LDL Cholesterol: 58 mg/dL (ref 0–99)
Triglycerides: 73 mg/dL (ref <150)
VLDL: 15 mg/dL (ref 0–40)

## 2017-01-30 LAB — BASIC METABOLIC PANEL
Anion gap: 13 (ref 5–15)
BUN: 49 mg/dL — ABNORMAL HIGH (ref 6–20)
CALCIUM: 9.1 mg/dL (ref 8.9–10.3)
CHLORIDE: 94 mmol/L — AB (ref 101–111)
CO2: 25 mmol/L (ref 22–32)
CREATININE: 1.54 mg/dL — AB (ref 0.44–1.00)
GFR calc Af Amer: 39 mL/min — ABNORMAL LOW (ref >60)
GFR calc non Af Amer: 33 mL/min — ABNORMAL LOW (ref >60)
GLUCOSE: 347 mg/dL — AB (ref 65–99)
Potassium: 4 mmol/L (ref 3.5–5.1)
Sodium: 132 mmol/L — ABNORMAL LOW (ref 135–145)

## 2017-01-30 LAB — TROPONIN I

## 2017-01-30 LAB — GLUCOSE, CAPILLARY
GLUCOSE-CAPILLARY: 305 mg/dL — AB (ref 65–99)
GLUCOSE-CAPILLARY: 244 mg/dL — AB (ref 65–99)

## 2017-01-30 MED ORDER — ESOMEPRAZOLE MAGNESIUM 40 MG PO PACK: 40.0000 mg | PACK | Freq: Every day | ORAL | 0 refills | Status: DC

## 2017-01-30 NOTE — Progress Notes (Signed)
[place] spoke to health care worker as patient was not available. CH provided prayer outside the room. CH will have another [place] follow-up.   01/30/17 1200  Clinical Encounter Type  Visited With Patient;Health care provider  Visit Type Initial;Spiritual support  Referral From Nurse

## 2017-01-30 NOTE — Progress Notes (Signed)
Pt being discharged to home. IV and heart monitor removed. Discharge instructions and education reviewed and provided to pt. Pt called a cab to take her home.

## 2017-01-30 NOTE — Consult Note (Signed)
Cardiology Consult    Patient ID: Tina Hayes MRN: [id number], DOB/AGE: 1946-02-29   Admit date: 01/29/2017 Date of Consult: 01/30/2017  Primary Physician: Herminio Commons, MD Primary Cardiologist: Johnny Bridge, MD  Requesting Provider: V. Manuella Ghazi, MD  Patient Profile    Tina Hayes is a 71 y.o. female with a history of CAD s/p prior PCI and CABG, HTN, HL, DM II, HFpEF, remote tob abuse, COPD, morbid obesity, sz d/o, and chronic back/left shoulder pain, who is being seen today for the evaluation of chest pain at the request of Dr. Manuella Ghazi.  Past Medical History   Past Medical History:  Diagnosis Date  . (HFpEF) heart failure with preserved ejection fraction ([place])    a. 05/2016 Echo: EF 60-65%, mild to mod LVH, Gr1 DD, mild MR, mildly dil LA, mod TR, mildly to mod increased PASP.  [name] Anxiety   . Chronic back pain   . COPD (chronic obstructive pulmonary disease) ([place])   . Coronary artery disease    a. s/p remote PCI x 5;  b. 2006 s/p CABG x 3 ([place], [place]); b. 05/2016 MV: attenuation corrected images w/o ischemia or wma-->Med rx.  . Depression   . Diabetes mellitus without complication ([place])   . Heart attack ([place])    Total of 3 per pt.  . Hypertension   . Seizures ([place])     Past Surgical History:  Procedure Laterality Date  . ANKLE SURGERY    . CORONARY ARTERY BYPASS GRAFT    . KNEE SURGERY       Allergies  Allergies  Allergen Reactions  . Aspirin Anaphylaxis    History of Present Illness    71 y/o ? with the above complex PMH including coronary artery disease status post prior percutaneous interventions and CABG 3 in 2006, hypertension, hyperlipidemia, diabetes, COPD, and HFpEF. She has been having intermittent chest and left shoulder pain and tenderness over many months. It sounds like she was treated with a muscle relaxant at some point this year with improvement in symptoms but then ran out of that medication about 2 months ago. Of note, she  was evaluated for chest pain in February of this year with stress testing which showed attenuation artifact without ischemia. Echo at that time showed normal LV function.  She has since noted not only musculoskeletal chest and shoulder pain but also intermittent fullness in her epigastric area, often after lunch. She has been maintained on PPI therapy and knows that in general that discomfort is well managed but she ran out of her PPI this past Wednesday. On Monday of this week, she began having worsening retrosternal chest pain that would radiate up to her neck and face and was worse with palpation and position changes. This was similar to what she was experiencing prior to being placed on what sounds like a muscle relaxant earlier this year. She was seen in heart failure clinic on October 2 and felt to have mild volume overload.  Metolazone was added to her regimen for 3 days and  following this, she did note improvement in breathing and also weight.  On the morning of October 4, she awoke at 4 AM with epigastric fullness, which she identifies as indigestion. This was associated with belching, nausea, and vomiting. Symptoms persisted for about 4 hours prior to her coming to the emergency department. Symptoms resolved shortly after arrival and have not recurred, though she continues to have musculoskeletal chest and shoulder pain.Despite prolonged symptoms,  cardiac markers are negative ad ECG is nonacute. We have been asked to evaluate.  Inpatient Medications    . amLODipine  5 mg Oral Daily  . calcium-vitamin D  1 tablet Oral BID  . carvedilol  3.125 mg Oral BID WC  . citalopram  20 mg Oral Daily  . clopidogrel  75 mg Oral Daily  . cyclobenzaprine  5 mg Oral BID  . enoxaparin (LOVENOX) injection  40 mg Subcutaneous Q12H  . ferrous sulfate  325 mg Oral TID WC  . furosemide  80 mg Oral Daily  . insulin aspart  0-15 Units Subcutaneous TID WC  . insulin aspart  0-5 Units Subcutaneous QHS  . insulin  glargine  40 Units Subcutaneous QHS  . isosorbide mononitrate  30 mg Oral BID  . levETIRAcetam  750 mg Oral BID  . loratadine  10 mg Oral Daily  . losartan  100 mg Oral Daily  . pantoprazole  40 mg Oral Daily  . potassium chloride SA  40 mEq Oral Daily  . rosuvastatin  10 mg Oral Daily  . tiotropium  18 mcg Inhalation Daily  . traZODone  50 mg Oral QHS    Family History    Family History  Problem Relation Age of Onset  . Diabetes Unknown   . Hypertension Unknown   . Diabetes Mother   . Heart failure Mother   . Heart disease Mother   . Heart attack Mother   . Stroke Mother   . Depression Mother   . Hypertension Mother   . Cancer Sister        brain  . Hypertension Sister   . Diabetes Brother   . Hypertension Brother   . Heart failure Sister   . Heart attack Sister   . SIDS Sister     Social History    Social History   Social History  . Marital status: Widowed    Spouse name: N/A  . Number of children: N/A  . Years of education: N/A   Occupational History  . Not on file.   Social History Main Topics  . Smoking status: Former Smoker    Packs/day: 0.25    Years: 20.00    Types: Cigarettes    Quit date: 07/01/2006  . Smokeless tobacco: Never Used  . Alcohol use No  . Drug use: No  . Sexual activity: Yes   Other Topics Concern  . Not on file   Social History Narrative   Lives locally.  Does not routinely exercise.     Review of Systems    General:  No chills, fever, night sweats or weight changes.  Cardiovascular:  +++  Reproducible chest pain with palpation and position changes, +++ dyspnea on exertion, no edema, orthopnea, palpitations, paroxysmal nocturnal dyspnea. Dermatological: No rash, lesions/masses Respiratory: No cough, +++ dyspnea Urologic: No hematuria, dysuria Abdominal:   No nausea, vomiting, diarrhea, bright red blood per rectum, melena, or hematemesis Neurologic:  No visual changes, wkns, changes in mental status. All other systems  reviewed and are otherwise negative except as noted above.  Physical Exam    Blood pressure (!) 113/94, pulse (!) 58, temperature (!) 97.5 F (36.4 C), temperature source Oral, resp. rate 16, height 5\' 11"  (1.803 m), weight 299 lb 4.8 oz (135.8 kg), SpO2 99 %.  General: Pleasant, NAD Psych: Normal affect. Neuro: Alert and oriented X 3. Moves all extremities spontaneously. HEENT: Normal  Neck: Supple without bruits or JVD. Lungs:  Resp regular and unlabored,  CTA. Heart: RRR no s3, s4, or murmurs. Abdomen: Soft, non-tender, non-distended, BS + x 4.  Extremities: No clubbing, cyanosis or edema. DP/PT/Radials 2+ and equal bilaterally.  Labs     Recent Labs  01/29/17 1553 01/29/17 1924 01/29/17 2345  TROPONINI <0.03 <0.03 <0.03   Lab Results  Component Value Date   WBC 8.1 01/30/2017   HGB 11.9 (L) 01/30/2017   HCT 35.7 01/30/2017   MCV 81.8 01/30/2017   PLT 302 01/30/2017    Recent Labs Lab 01/30/17 0404  NA 132*  K 4.0  CL 94*  CO2 25  BUN 49*  CREATININE 1.54*  CALCIUM 9.1  GLUCOSE 347*   Lab Results  Component Value Date   CHOL 120 01/30/2017   HDL 47 01/30/2017   LDLCALC 58 01/30/2017   TRIG 73 01/30/2017    Radiology Studies    Dg Chest 1 View  Result Date: 01/29/2017 CLINICAL DATA:  Chest pressure EXAM: CHEST 1 VIEW COMPARISON:  11/13/2016 FINDINGS: Post sternotomy changes. Faintly visualized coronary stent or calcification. Mildly low lung volumes. Atelectasis at the left base. Moderate hiatal hernia. Mild cardiomegaly. Negative for acute infiltrate, pleural effusion or pneumothorax IMPRESSION: 1. Mild cardiomegaly 2. Mild atelectasis at the left base 3. Hiatal hernia Electronically Signed   By: Donavan Foil M.D.   On: 01/29/2017 15:04   Dg C-arm 1-60 Min-no Report  Result Date: 01/22/2017 Fluoroscopy was utilized by the requesting physician.  No radiographic interpretation.    ECG & Cardiac Imaging    Rsr, 63, pvc's delayed r progression, st dep  I, aVL. No acute changes.  Assessment & Plan    1. Musculoskeletal chest pain: Patient has been having musculoskeletal chest pain since this past Monday. Is easily reproducible with palpation, position changes, and abduction of the left shoulder. She has been having this intermittently for many months and this was previously controlled with muscle relaxant therapy. She is currently on Flexeril-continue.  2. GERD: Patient has a history of GERD that was well managed with PPI therapy. She ran out of PPI on Wednesday and began having epigastric fullness with nausea and vomiting on Thursday morning. The symptoms have resolved. Continue PPI.  3. Coronary artery disease: Patient with a history of CAD status post CABG and relatively recent nonischemic stress test in February of this year. LV function was normal at that time.  Though she has had chest pain and GERD symptoms as outlined above, neither of those symptoms appear to be ischemic in origin and there has been no objective evidence of ischemia this admission ECG unchanged and troponins normal. Would not pursue further cardiac evaluation. Continue beta blocker, Plavix, nitrate, and statin therapy. She is allergic to aspirin.  4. Acute renal failure: In the setting of prerenal azotemia and probable overdiuresis.she was prescribed metolazone earlier this week but has not received this since hospitalization.she is back on her home dose of Lasix, which was 80 mg daily.  If she is discharged today, I would recommend holding Lasix for 1 day prior to resumption.  5. Essential hypertension: Stable.  6. Hyperlipidemia: Continue statin therapy.  7.  DMII:  Per IM.  Signed, Murray Hodgkins, NP 01/30/2017, 10:53 AM  For questions or updates, please contact   Please consult [email or web address] for contact info under Cardiology/STEMI.

## 2017-01-30 NOTE — Care Management (Signed)
Patient requested prescription for a new glucose meter.  Has the supplies but meter is not working.  Attending provided

## 2017-01-30 NOTE — Telephone Encounter (Signed)
appt scheduled for 11/13 needs sooner

## 2017-01-30 NOTE — Discharge Instructions (Signed)
Chest Wall Pain °Chest wall pain is pain in or around the bones and muscles of your chest. Sometimes, an injury causes this pain. Sometimes, the cause may not be known. This pain may take several weeks or longer to get better. °Follow these instructions at home: °Pay attention to any changes in your symptoms. Take these actions to help with your pain: °· Rest as told by your doctor. °· Avoid activities that cause pain. Try not to use your chest, belly (abdominal), or side muscles to lift heavy things. °· If directed, apply ice to the painful area: °? Put ice in a plastic bag. °? Place a towel between your skin and the bag. °? Leave the ice on for 20 minutes, 2-3 times per day. °· Take over-the-counter and prescription medicines only as told by your doctor. °· Do not use tobacco products, including cigarettes, chewing tobacco, and e-cigarettes. If you need help quitting, ask your doctor. °· Keep all follow-up visits as told by your doctor. This is important. ° °Contact a doctor if: °· You have a fever. °· Your chest pain gets worse. °· You have new symptoms. °Get help right away if: °· You feel sick to your stomach (nauseous) or you throw up (vomit). °· You feel sweaty or light-headed. °· You have a cough with phlegm (sputum) or you cough up blood. °· You are short of breath. °This information is not intended to replace advice given to you by your health care provider. Make sure you discuss any questions you have with your health care provider. °Document Released: 10/01/2007 Document Revised: 09/20/2015 Document Reviewed: 07/10/2014 °Elsevier Interactive Patient Education © 2018 [employer]vier Inc. ° °

## 2017-01-30 NOTE — Telephone Encounter (Signed)
TCM for [facility] ph f/u  Needs in 2 weeks Scheduled 11/13 with Dr Rockey Situ Added to waitlist

## 2017-01-30 NOTE — Care Management Obs Status (Signed)
[place] NOTIFICATION   Patient Details  Name: Tina Hayes MRN: [id number] Date of Birth: 1946/04/20   Medicare Observation Status Notification Given:  Yes Notice signed, one given to patient and the other to HIM for scanning    Katrina Stack, RN 01/30/2017, 12:38 PM

## 2017-01-30 NOTE — Progress Notes (Signed)
Patient continues on NPO. Currently complains of no pain, dyspnea, or discomfort. Pt awake in bed. Will continue to monitor patient to end of shift and report to oncoming nurse.

## 2017-01-30 NOTE — Progress Notes (Signed)
Rx for AcuCheck glucometer called in [place] on [address]   Ramond Dial, Pharm.D, BCPS Clinical Pharmacist

## 2017-01-30 NOTE — Telephone Encounter (Signed)
Patient contacted regarding discharge from [facility] on Pending today.  Patient understands to follow up with provider Dr. Rockey Situ on 02/17/17 at 2:20 PM at [facility]. Patient understands discharge instructions? Pending Patient understands medications and regiment? Pending Patient understands to bring all medications to this visit? Pending  Patient is still in hospital and per friend should be going home later today. Reviewed appointment information and instructions to call if they should have any questions regarding discharge instructions or medications. He was appreciative for the call and had no further questions at this time.

## 2017-01-30 NOTE — Telephone Encounter (Signed)
Spoke with Tina Hayes patient still admitted changed appt  He will let the patient know

## 2017-01-30 NOTE — Telephone Encounter (Signed)
Currently admitted.

## 2017-02-01 NOTE — Discharge Summary (Signed)
[place] at [place] NAME: Tina Hayes    MR#:  [id number]  DATE OF BIRTH:  07-23-1945  DATE OF ADMISSION:  01/29/2017   ADMITTING PHYSICIAN: Loletha Grayer, MD  DATE OF DISCHARGE: 01/30/2017  5:35 PM  PRIMARY CARE PHYSICIAN: Soles, Howell Rucks, MD   ADMISSION DIAGNOSIS:  Other chest pain [R07.89] Dyspnea, unspecified type [R06.00] DISCHARGE DIAGNOSIS:  Active Problems:   Chest pain  SECONDARY DIAGNOSIS:   Past Medical History:  Diagnosis Date  . (HFpEF) heart failure with preserved ejection fraction ([place])    a. 05/2016 Echo: EF 60-65%, mild to mod LVH, Gr1 DD, mild MR, mildly dil LA, mod TR, mildly to mod increased PASP.  [name] Anxiety   . Chronic back pain   . COPD (chronic obstructive pulmonary disease) ([place])   . Coronary artery disease    a. s/p remote PCI x 5;  b. 2006 s/p CABG x 3 ([place], [place]); b. 05/2016 MV: attenuation corrected images w/o ischemia or wma-->Med rx.  . Depression   . Diabetes mellitus without complication ([place])   . Heart attack ([place])    Total of 3 per pt.  . Hypertension   . Seizures [facility])    HOSPITAL COURSE:  71 year old woman with history of coronary disease, CABG, hypertension, hyperlipidemia, diabetes, diastolic CHF, morbid obesity, COPD admitted with chest pain radiating up to her neck.   1. Atypical chest pain: doesn't seem cardiac. Likely musculoskeletal as Is easily reproducible with palpation, position changes, and abduction of the left shoulder. She has been having this intermittently for many months and this was previously controlled with muscle relaxant therapy. She is currently on Flexeril- requested her to continue same.  2. GERD: Patient has a history of GERD that was well managed with PPI therapy. She ran out of PPI on Wednesday and began having epigastric fullness with nausea and vomiting on Thursday morning.  - She's also reporting episodes of food that seem  to get stuck in chest/mediastinum. - Meat such as chicken and steak are worst.  - Tums did not seem to help.  - Given script for Nexium to try - outpt GI eval, may need endoscopy if no improvement - Recommended she do bland food, soft, avoid meat for now - Avoid large meals  3. Coronary artery disease: Patient with a history of CAD status post CABG and relatively recent nonischemic stress test in February of this year. LV function was normal at that time.  Though she has had chest pain and GERD symptoms as outlined above, neither of those symptoms appear to be ischemic in origin and there has been no objective evidence of ischemia this admission ECG unchanged and troponins normal. Would not pursue further cardiac evaluation. Continue beta blocker, Plavix, nitrate, and statin therapy. She is allergic to aspirin.  4. Acute renal failure: In the setting of prerenal azotemia and probable overdiuresis.she was prescribed metolazone earlier this week but has not received this since hospitalization.she is back on her home dose of Lasix, which was 40 mg twice daily.  She was asked to hold Lasix for 1 day and resume it on 02/01/2017.  5. Essential hypertension: Stable.  6. Hyperlipidemia: Continue statin therapy.  DISCHARGE CONDITIONS:  stable CONSULTS OBTAINED:  Treatment Team:  Minna Merritts, MD DRUG ALLERGIES:   Allergies  Allergen Reactions  . Aspirin Anaphylaxis   DISCHARGE MEDICATIONS:   Allergies as of 01/30/2017      Reactions  Aspirin Anaphylaxis      Medication List    STOP taking these medications   omeprazole 40 MG capsule Commonly known as:  PRILOSEC     TAKE these medications   ACCU-CHEK AVIVA PLUS test strip Generic drug:  glucose blood   ACCU-CHEK AVIVA PLUS w/Device Kit   albuterol 108 (90 Base) MCG/ACT inhaler Commonly known as:  PROVENTIL HFA;VENTOLIN HFA Inhale 2 puffs into the lungs every 6 (six) hours as needed for wheezing or shortness of breath.    amLODipine 5 MG tablet Commonly known as:  NORVASC Take 1 tablet (5 mg total) by mouth daily.   CALCIUM 600-D 600-400 MG-UNIT Tabs Generic drug:  Calcium Carbonate-Vitamin D3 Take 1 tablet by mouth 2 (two) times daily.   carvedilol 3.125 MG tablet Commonly known as:  COREG Take 1 tablet (3.125 mg total) by mouth 2 (two) times daily with a meal.   cetirizine 10 MG tablet Commonly known as:  ZYRTEC Take 10 mg by mouth daily.   citalopram 20 MG tablet Commonly known as:  CELEXA Take 20 mg by mouth daily.   clopidogrel 75 MG tablet Commonly known as:  PLAVIX Take 1 tablet (75 mg total) by mouth daily.   cyclobenzaprine 5 MG tablet Commonly known as:  FLEXERIL Take 5 mg by mouth 2 (two) times daily.   esomeprazole 40 MG packet Commonly known as:  NEXIUM Take 40 mg by mouth daily before breakfast.   ferrous sulfate 325 (65 FE) MG tablet Take 325 mg by mouth 3 (three) times daily with meals.   furosemide 40 MG tablet Commonly known as:  LASIX Take 1 tablet (40 mg total) by mouth 2 (two) times daily. What changed:  how much to take  when to take this   insulin aspart 100 UNIT/ML injection Commonly known as:  novoLOG Inject 5-10 Units into the skin 2 (two) times daily. 10 units in the morning 5 units at lunch   insulin glargine 100 UNIT/ML injection Commonly known as:  LANTUS Inject 0.2-0.3 mLs (20-30 Units total) into the skin 2 (two) times daily. Take 20 units in morning and 30 units at night What changed:  additional instructions   ipratropium-albuterol 0.5-2.5 (3) MG/3ML Soln Commonly known as:  DUONEB Take 3 mLs by nebulization every 6 (six) hours as needed (COPD).   isosorbide mononitrate 30 MG 24 hr tablet Commonly known as:  IMDUR Take 1 tablet (30 mg total) by mouth 2 (two) times daily.   levETIRAcetam 750 MG tablet Commonly known as:  KEPPRA Take 1 tablet (750 mg total) by mouth 2 (two) times daily.   losartan 100 MG tablet Commonly known as:   COZAAR Take 1 tablet (100 mg total) by mouth daily.   meloxicam 7.5 MG tablet Commonly known as:  MOBIC Take 1 tablet (7.5 mg total) by mouth daily.   nitroGLYCERIN 0.4 MG SL tablet Commonly known as:  NITROSTAT Place 1 tablet (0.4 mg total) under the tongue every 5 (five) minutes as needed for chest pain.   potassium chloride SA 20 MEQ tablet Commonly known as:  K-DUR,KLOR-CON Take 1 tablet (20 mEq total) by mouth daily. What changed:  how much to take   rosuvastatin 10 MG tablet Commonly known as:  CRESTOR Take 10 mg by mouth daily.   tiotropium 18 MCG inhalation capsule Commonly known as:  SPIRIVA Place 18 mcg into inhaler and inhale daily.   traMADol 50 MG tablet Commonly known as:  ULTRAM Take 2 tablets (100 mg  total) by mouth every 6 (six) hours as needed for moderate pain.   traZODone 50 MG tablet Commonly known as:  DESYREL Take 50 mg by mouth at bedtime.   UNIFINE PENTIPS 31G X 6 MM Misc Generic drug:  Insulin Pen Needle        DISCHARGE INSTRUCTIONS:   DIET:  Cardiac diet DISCHARGE CONDITION:  Good ACTIVITY:  Activity as tolerated OXYGEN:  Home Oxygen: No.  Oxygen Delivery: room air DISCHARGE LOCATION:  home   If you experience worsening of your admission symptoms, develop shortness of breath, life threatening emergency, suicidal or homicidal thoughts you must seek medical attention immediately by calling 911 or calling your MD immediately  if symptoms less severe.  You Must read complete instructions/literature along with all the possible adverse reactions/side effects for all the Medicines you take and that have been prescribed to you. Take any new Medicines after you have completely understood and accpet all the possible adverse reactions/side effects.   Please note  You were cared for by a hospitalist during your hospital stay. If you have any questions about your discharge medications or the care you received while you were in the hospital  after you are discharged, you can call the unit and asked to speak with the hospitalist on call if the hospitalist that took care of you is not available. Once you are discharged, your primary care physician will handle any further medical issues. Please note that NO REFILLS for any discharge medications will be authorized once you are discharged, as it is imperative that you return to your primary care physician (or establish a relationship with a primary care physician if you do not have one) for your aftercare needs so that they can reassess your need for medications and monitor your lab values.    On the day of Discharge:  VITAL SIGNS:  Blood pressure (!) 155/79, pulse 64, temperature 97.7 F (36.5 C), temperature source Oral, resp. rate 16, height '5\' 11"'  (1.803 m), weight 135.8 kg (299 lb 4.8 oz), SpO2 100 %. PHYSICAL EXAMINATION:  GENERAL:  71 y.o.-year-old patient lying in the bed with no acute distress.  EYES: Pupils equal, round, reactive to light and accommodation. No scleral icterus. Extraocular muscles intact.  HEENT: Head atraumatic, normocephalic. Oropharynx and nasopharynx clear.  NECK:  Supple, no jugular venous distention. No thyroid enlargement, no tenderness.  LUNGS: Normal breath sounds bilaterally, no wheezing, rales,rhonchi or crepitation. No use of accessory muscles of respiration.  CARDIOVASCULAR: S1, S2 normal. No murmurs, rubs, or gallops.  ABDOMEN: Soft, non-tender, non-distended. Bowel sounds present. No organomegaly or mass.  EXTREMITIES: No pedal edema, cyanosis, or clubbing.  NEUROLOGIC: Cranial nerves II through XII are intact. Muscle strength 5/5 in all extremities. Sensation intact. Gait not checked.  PSYCHIATRIC: The patient is alert and oriented x 3.  SKIN: No obvious rash, lesion, or ulcer.  DATA REVIEW:   CBC  Recent Labs Lab 01/30/17 0404  WBC 8.1  HGB 11.9*  HCT 35.7  PLT 302    Chemistries   Recent Labs Lab 01/30/17 0404  NA 132*  K 4.0    CL 94*  CO2 25  GLUCOSE 347*  BUN 49*  CREATININE 1.54*  CALCIUM 9.1     Follow-up Information    Soles, Howell Rucks, MD. Schedule an appointment as soon as possible for a visit in 1 week.   Specialty:  Family Medicine Why:  Please call office to make this appointment! Contact information: [address]  [place] [address] [phone]        Minna Merritts, MD. Schedule an appointment as soon as possible for a visit on 03/10/2017.   Specialty:  Cardiology Why:  Appointment Time: 9:20am, the nurse will call to move up appointment. Contact information: [place] [address] [phone]           Management plans discussed with the patient, family and they are in agreement.  CODE STATUS: Prior   TOTAL TIME TAKING CARE OF THIS PATIENT: 45 minutes.    Max Sane M.D on 02/01/2017 at 9:20 AM  Between 7am to 6pm - Pager - [phone]  After 6pm go to [email or web address] - Proofreader  [facility]  Office  [phone]  CC: Primary care physician; Herminio Commons, MD   Note: This dictation was prepared with Dragon dictation along with smaller phrase technology. Any transcriptional errors that result from this process are unintentional.

## 2017-02-02 ENCOUNTER — Ambulatory Visit: Payer: Medicare HMO | Attending: Family | Admitting: Family

## 2017-02-02 ENCOUNTER — Encounter: Payer: Self-pay | Admitting: Family

## 2017-02-02 ENCOUNTER — Telehealth: Payer: Self-pay | Admitting: Family

## 2017-02-02 VITALS — BP 102/58 | HR 69 | Resp 20 | Ht 71.0 in | Wt 299.0 lb

## 2017-02-02 DIAGNOSIS — R1310 Dysphagia, unspecified: Secondary | ICD-10-CM | POA: Insufficient documentation

## 2017-02-02 DIAGNOSIS — M549 Dorsalgia, unspecified: Secondary | ICD-10-CM | POA: Diagnosis not present

## 2017-02-02 DIAGNOSIS — Z79891 Long term (current) use of opiate analgesic: Secondary | ICD-10-CM | POA: Diagnosis not present

## 2017-02-02 DIAGNOSIS — I509 Heart failure, unspecified: Secondary | ICD-10-CM | POA: Diagnosis not present

## 2017-02-02 DIAGNOSIS — Z8249 Family history of ischemic heart disease and other diseases of the circulatory system: Secondary | ICD-10-CM | POA: Diagnosis not present

## 2017-02-02 DIAGNOSIS — Z9889 Other specified postprocedural states: Secondary | ICD-10-CM | POA: Diagnosis not present

## 2017-02-02 DIAGNOSIS — I5032 Chronic diastolic (congestive) heart failure: Secondary | ICD-10-CM

## 2017-02-02 DIAGNOSIS — G8929 Other chronic pain: Secondary | ICD-10-CM | POA: Diagnosis not present

## 2017-02-02 DIAGNOSIS — I110 Hypertensive heart disease with heart failure: Secondary | ICD-10-CM | POA: Diagnosis present

## 2017-02-02 DIAGNOSIS — Z823 Family history of stroke: Secondary | ICD-10-CM | POA: Diagnosis not present

## 2017-02-02 DIAGNOSIS — Z79899 Other long term (current) drug therapy: Secondary | ICD-10-CM | POA: Insufficient documentation

## 2017-02-02 DIAGNOSIS — Z794 Long term (current) use of insulin: Secondary | ICD-10-CM | POA: Insufficient documentation

## 2017-02-02 DIAGNOSIS — F329 Major depressive disorder, single episode, unspecified: Secondary | ICD-10-CM | POA: Insufficient documentation

## 2017-02-02 DIAGNOSIS — Z87891 Personal history of nicotine dependence: Secondary | ICD-10-CM | POA: Diagnosis not present

## 2017-02-02 DIAGNOSIS — E119 Type 2 diabetes mellitus without complications: Secondary | ICD-10-CM | POA: Insufficient documentation

## 2017-02-02 DIAGNOSIS — Z808 Family history of malignant neoplasm of other organs or systems: Secondary | ICD-10-CM | POA: Diagnosis not present

## 2017-02-02 DIAGNOSIS — I10 Essential (primary) hypertension: Secondary | ICD-10-CM

## 2017-02-02 DIAGNOSIS — Z833 Family history of diabetes mellitus: Secondary | ICD-10-CM | POA: Insufficient documentation

## 2017-02-02 LAB — BASIC METABOLIC PANEL
ANION GAP: 13 (ref 5–15)
BUN: 57 mg/dL — ABNORMAL HIGH (ref 6–20)
CHLORIDE: 97 mmol/L — AB (ref 101–111)
CO2: 24 mmol/L (ref 22–32)
Calcium: 9.2 mg/dL (ref 8.9–10.3)
Creatinine, Ser: 1.72 mg/dL — ABNORMAL HIGH (ref 0.44–1.00)
GFR calc Af Amer: 34 mL/min — ABNORMAL LOW (ref >60)
GFR, EST NON AFRICAN AMERICAN: 29 mL/min — AB (ref >60)
GLUCOSE: 321 mg/dL — AB (ref 65–99)
POTASSIUM: 4.1 mmol/L (ref 3.5–5.1)
Sodium: 134 mmol/L — ABNORMAL LOW (ref 135–145)

## 2017-02-02 NOTE — Progress Notes (Signed)
Patient ID: Tina Hayes, female    DOB: [birth year], 71 y.o.   MRN: [id number]  HPI  Tina Hayes is a 71 y/o female with a history of seizures, HTN, cardiac stents, DM, depression, CAD, COPD, chronic back pain, anxiety, obesity, remote tobacco use and chronic heart failure.   Last echo was done 06/17/16 and showed an EF of 60-65% along with mild MR and moderate TR. Pulmonary systolic pressure was mildly to moderately elevated. Stress test done 06/18/16.  Admitted 01/29/17 due to atypical chest pain. Cardiology consult was obtained. She was discharged the next day. Was in the ED 11/13/16 due to left upper arm pain. Treated and released. Was in the ED 08/14/16 due to nausea, vomiting and diarrhea. Evaluated and discharged home.  Admitted 06/16/16 with chest pain. Had negative troponins. Cardiology consult done and patient underwent stress test which was read as low risk for ischemia. Medications were adjusted and she was discharged home after 2 days. Admitted 08/28/15 due to COPD exacerbation. Received IV steroids, nebulizers and antibiotics. She was discharged home after 2 days.   She presents today for her follow-up visit with a chief complaint of weakness. This has been present for the last few weeks although seems to be worsening. She has associated fatigue, chest tightness, shortness of breath. Light-headedness and headaches. She denies any weight gain. She continues to have difficulty swallowing and feels like food is getting stuck in her throat.   Past Medical History:  Diagnosis Date  . (HFpEF) heart failure with preserved ejection fraction ([place])    a. 05/2016 Echo: EF 60-65%, mild to mod LVH, Gr1 DD, mild MR, mildly dil LA, mod TR, mildly to mod increased PASP.  [name] Anxiety   . Chronic back pain   . COPD (chronic obstructive pulmonary disease) ([place])   . Coronary artery disease    a. s/p remote PCI x 5;  b. 2006 s/p CABG x 3 ([place], [place]); b. 05/2016 MV: attenuation corrected  images w/o ischemia or wma-->Med rx.  . Depression   . Diabetes mellitus without complication ([place])   . Heart attack ([place])    Total of 3 per pt.  . Hypertension   . Seizures ([place])    Past Surgical History:  Procedure Laterality Date  . ANKLE SURGERY    . CORONARY ARTERY BYPASS GRAFT    . KNEE SURGERY     Family History  Problem Relation Age of Onset  . Diabetes Unknown   . Hypertension Unknown   . Diabetes Mother   . Heart failure Mother   . Heart disease Mother   . Heart attack Mother   . Stroke Mother   . Depression Mother   . Hypertension Mother   . Cancer Sister        brain  . Hypertension Sister   . Diabetes Brother   . Hypertension Brother   . Heart failure Sister   . Heart attack Sister   . SIDS Sister    Social History  Substance Use Topics  . Smoking status: Former Smoker    Packs/day: 0.25    Years: 20.00    Types: Cigarettes    Quit date: 07/01/2006  . Smokeless tobacco: Never Used  . Alcohol use No   Allergies  Allergen Reactions  . Aspirin Anaphylaxis   Prior to Admission medications   Medication Sig Start Date End Date Taking? Authorizing Provider  ACCU-CHEK AVIVA PLUS test strip  05/19/16  Yes [provider]  albuterol (PROVENTIL HFA;VENTOLIN HFA) 108 (90 Base) MCG/ACT inhaler Inhale 2 puffs into the lungs every 6 (six) hours as needed for wheezing or shortness of breath. 08/30/15  Yes Epifanio Lesches, MD  amLODipine (NORVASC) 5 MG tablet Take 1 tablet (5 mg total) by mouth daily. 08/14/16  Yes Minna Merritts, MD  atorvastatin (LIPITOR) 40 MG tablet Take 40 mg by mouth daily.   Yes [provider]  Blood Glucose Monitoring Suppl (ACCU-CHEK AVIVA PLUS) w/Device KIT  06/13/16  Yes [provider]  carvedilol (COREG) 3.125 MG tablet Take 1 tablet (3.125 mg total) by mouth 2 (two) times daily with a meal. 08/12/16  Yes Cheyanna Strick A, FNP  cetirizine (ZYRTEC) 10 MG tablet Take 10 mg by mouth daily.   Yes [provider]  citalopram (CELEXA) 20 MG tablet Take 20 mg by mouth daily.   Yes [provider]  clopidogrel (PLAVIX) 75 MG tablet Take 1 tablet (75 mg total) by mouth daily. 08/14/16  Yes Minna Merritts, MD  esomeprazole (NEXIUM) 40 MG packet Take 40 mg by mouth daily before breakfast. 01/30/17  Yes Max Sane, MD  furosemide (LASIX) 40 MG tablet Take 1 tablet (40 mg total) by mouth 2 (two) times daily. 08/12/16  Yes Eames Dibiasio, Otila Kluver A, FNP  insulin aspart (NOVOLOG) 100 UNIT/ML injection Inject 5-10 Units into the skin 2 (two) times daily. 10 units in the morning 5 units at lunch   Yes [provider]  insulin glargine (LANTUS) 100 UNIT/ML injection Inject 0.2-0.3 mLs (20-30 Units total) into the skin 2 (two) times daily. Take 20 units in morning and 30 units at night Patient taking differently: Inject 20 Units into the skin 2 (two) times daily.  06/18/16 01/29/18 Yes Mody, Sital, MD  ipratropium-albuterol (DUONEB) 0.5-2.5 (3) MG/3ML SOLN Take 3 mLs by nebulization every 6 (six) hours as needed (COPD).   Yes [provider]  isosorbide mononitrate (IMDUR) 30 MG 24 hr tablet Take 1 tablet (30 mg total) by mouth 2 (two) times daily. 08/12/16  Yes Kimbrely Buckel A, FNP  isosorbide-hydrALAZINE (BIDIL) 20-37.5 MG tablet Take 1 tablet by mouth 3 (three) times daily.   Yes [provider]  levETIRAcetam (KEPPRA) 750 MG tablet Take 1 tablet (750 mg total) by mouth 2 (two) times daily. 06/05/15  Yes Rai, Ripudeep K, MD  losartan (COZAAR) 100 MG tablet Take 1 tablet (100 mg total) by mouth daily. 08/12/16  Yes Sadae Arrazola, Otila Kluver A, FNP  nitroGLYCERIN (NITROSTAT) 0.4 MG SL tablet Place 1 tablet (0.4 mg total) under the tongue every 5 (five) minutes as needed for chest pain. 07/01/16 07/01/17 Yes Gollan, Kathlene November, MD  potassium chloride SA (K-DUR,KLOR-CON) 20 MEQ tablet Take 1 tablet (20 mEq total) by mouth daily. 08/12/16  Yes Darylene Price A, FNP  tiotropium (SPIRIVA) 18 MCG inhalation  capsule Place 18 mcg into inhaler and inhale daily.   Yes [provider]  traMADol (ULTRAM) 50 MG tablet Take 2 tablets (100 mg total) by mouth every 6 (six) hours as needed for moderate pain. 01/22/17  Yes Molli Barrows, MD  traZODone (DESYREL) 50 MG tablet Take 50 mg by mouth at bedtime.   Yes [provider]  UNIFINE PENTIPS 31G X 6 MM [place]  07/23/16  Yes [provider]    Review of Systems  Constitutional: Positive for fatigue. Negative for appetite change.  HENT: Positive for trouble swallowing. Negative for congestion, postnasal drip and sore throat.   Eyes: Negative.  Respiratory: Positive for chest tightness (better) and shortness of breath (when lying down flat at night). Negative for cough.   Cardiovascular: Positive for palpitations and leg swelling. Negative for chest pain.  Gastrointestinal: Positive for nausea. Negative for abdominal distention and abdominal pain.  Endocrine: Negative.   Genitourinary: Negative.   Musculoskeletal: Positive for arthralgias (left shoulder pain) and back pain (chronic). Negative for neck pain.  Skin: Negative.   Allergic/Immunologic: Negative.   Neurological: Positive for weakness, light-headedness and headaches. Negative for dizziness.  Hematological: Negative for adenopathy. Does not bruise/bleed easily.  Psychiatric/Behavioral: Positive for dysphoric mood. Negative for sleep disturbance (sleeping better) and suicidal ideas. The patient is not nervous/anxious.    Vitals:   02/02/17 0843  BP: (!) 102/58  Pulse: 69  Resp: 20  SpO2: 100%  Weight: 299 lb (135.6 kg)  Height: _0  (1.803 m)   Wt Readings from Last 3 Encounters:  02/02/17 299 lb (135.6 kg)  01/30/17 299 lb 4.8 oz (135.8 kg)  01/27/17 (!) 319 lb 2 oz (144.8 kg)    Lab Results  Component Value Date   CREATININE 1.54 (H) 01/30/2017   CREATININE 1.34 (H) 01/29/2017   CREATININE 0.73 01/27/2017   Physical Exam  Constitutional: She is  oriented to person, place, and time. She appears well-developed and well-nourished.  HENT:  Head: Normocephalic and atraumatic.  Neck: Normal range of motion. Neck supple. No JVD present.  Cardiovascular: Regular rhythm.  Bradycardia present.   Pulmonary/Chest: Effort normal. She has no wheezes. She has no rales.  Abdominal: Soft. She exhibits no distension. There is no tenderness.  Musculoskeletal: She exhibits no edema or tenderness.  Neurological: She is alert and oriented to person, place, and time.  Skin: Skin is warm and dry.  Psychiatric: She has a normal mood and affect. Her behavior is normal.  Nursing note and vitals reviewed.  Assessment & Plan:  1: Chronic heart failure with preserved ejection fraction- - NYHA class III - euvolemic today - weighing daily and says that her home weight has decreased greatly.  Reminded to call for an overnight weight gain of >2 pounds or a weekly weight gain of >5 pounds.  - weight down 20 pounds by our scale since the last time she was here 6 days ago - decrease furosemide to 26m daily; can take the 2nd dose if needed for above weight gain, edema or shortness of breath - has been trying to keep her fluid intake to 40-60 ounces of fluid daily.   - saw her cardiologist (Rockey Situ 10/02/16 - draw a BMP today  2: HTN- - BP on the low side today which could be why she's feeling weak - BMP from 01/30/17 reviewed; potassium 4.0 and GFR 39 - will stop the amlodipine due to hypotension - sees PCP (Ancil Boozer 02/04/17   3: Diabetes-  - glucose at home today was 294 - sees PCP per above this week  4: Depression-  - patient tearful in the office as she says that she doesn't feel like she can do anything for herself right now - emotional support given; medication change per above  5: Dysphagia- - patient says that food feels like it's getting stuck in her throat which causes pain, nausea and/or vomiting.  - doesn't seem to occur with liquids - isn't  eating much because of fear of getting sick - encouraged her to drink glucerna for nutrition and discuss a possible GI referral with her PCP this week.  Reviewed medication bottles with  the patient.  Return next week for a recheck of symptoms.

## 2017-02-02 NOTE — Patient Instructions (Addendum)
Continue weighing daily and call for an overnight weight gain of > 2 pounds or a weekly weight gain of >5 pounds.  Stop amlodipine for now.  Get some glucerna to drink while you are having difficulty swallowing food.

## 2017-02-02 NOTE — Telephone Encounter (Signed)
LM on patient's voicemail regarding lab results obtained from 02/02/17. Renal function has declined slightly but patient was just discharged from the hospital on 01/30/17. Will plan on rechecking her labs at her next appointment on 02/10/17. Patient instructed to call back for any questions or if she would like details about her lab results.

## 2017-02-10 ENCOUNTER — Ambulatory Visit: Payer: Medicaid Other | Admitting: Family

## 2017-02-13 ENCOUNTER — Encounter: Payer: Self-pay | Admitting: Family

## 2017-02-13 ENCOUNTER — Ambulatory Visit: Payer: Medicare HMO | Attending: Family | Admitting: Family

## 2017-02-13 ENCOUNTER — Telehealth: Payer: Self-pay | Admitting: Family

## 2017-02-13 VITALS — BP 95/48 | HR 59 | Resp 18 | Ht 71.0 in | Wt 310.2 lb

## 2017-02-13 DIAGNOSIS — Z818 Family history of other mental and behavioral disorders: Secondary | ICD-10-CM | POA: Insufficient documentation

## 2017-02-13 DIAGNOSIS — R1310 Dysphagia, unspecified: Secondary | ICD-10-CM | POA: Insufficient documentation

## 2017-02-13 DIAGNOSIS — I5032 Chronic diastolic (congestive) heart failure: Secondary | ICD-10-CM | POA: Insufficient documentation

## 2017-02-13 DIAGNOSIS — M549 Dorsalgia, unspecified: Secondary | ICD-10-CM | POA: Insufficient documentation

## 2017-02-13 DIAGNOSIS — I110 Hypertensive heart disease with heart failure: Secondary | ICD-10-CM | POA: Insufficient documentation

## 2017-02-13 DIAGNOSIS — Z808 Family history of malignant neoplasm of other organs or systems: Secondary | ICD-10-CM | POA: Diagnosis not present

## 2017-02-13 DIAGNOSIS — Z794 Long term (current) use of insulin: Secondary | ICD-10-CM | POA: Insufficient documentation

## 2017-02-13 DIAGNOSIS — E119 Type 2 diabetes mellitus without complications: Secondary | ICD-10-CM

## 2017-02-13 DIAGNOSIS — Z87891 Personal history of nicotine dependence: Secondary | ICD-10-CM | POA: Insufficient documentation

## 2017-02-13 DIAGNOSIS — Z955 Presence of coronary angioplasty implant and graft: Secondary | ICD-10-CM | POA: Insufficient documentation

## 2017-02-13 DIAGNOSIS — Z8249 Family history of ischemic heart disease and other diseases of the circulatory system: Secondary | ICD-10-CM | POA: Diagnosis not present

## 2017-02-13 DIAGNOSIS — I252 Old myocardial infarction: Secondary | ICD-10-CM | POA: Insufficient documentation

## 2017-02-13 DIAGNOSIS — G8929 Other chronic pain: Secondary | ICD-10-CM | POA: Insufficient documentation

## 2017-02-13 DIAGNOSIS — Z823 Family history of stroke: Secondary | ICD-10-CM | POA: Insufficient documentation

## 2017-02-13 DIAGNOSIS — Z9889 Other specified postprocedural states: Secondary | ICD-10-CM | POA: Insufficient documentation

## 2017-02-13 DIAGNOSIS — I2510 Atherosclerotic heart disease of native coronary artery without angina pectoris: Secondary | ICD-10-CM | POA: Insufficient documentation

## 2017-02-13 DIAGNOSIS — Z79899 Other long term (current) drug therapy: Secondary | ICD-10-CM | POA: Diagnosis not present

## 2017-02-13 DIAGNOSIS — R569 Unspecified convulsions: Secondary | ICD-10-CM | POA: Diagnosis not present

## 2017-02-13 DIAGNOSIS — Z833 Family history of diabetes mellitus: Secondary | ICD-10-CM | POA: Insufficient documentation

## 2017-02-13 DIAGNOSIS — F419 Anxiety disorder, unspecified: Secondary | ICD-10-CM | POA: Diagnosis not present

## 2017-02-13 DIAGNOSIS — I10 Essential (primary) hypertension: Secondary | ICD-10-CM

## 2017-02-13 DIAGNOSIS — F329 Major depressive disorder, single episode, unspecified: Secondary | ICD-10-CM | POA: Insufficient documentation

## 2017-02-13 DIAGNOSIS — E669 Obesity, unspecified: Secondary | ICD-10-CM | POA: Diagnosis not present

## 2017-02-13 DIAGNOSIS — Z886 Allergy status to analgesic agent status: Secondary | ICD-10-CM | POA: Insufficient documentation

## 2017-02-13 DIAGNOSIS — Z951 Presence of aortocoronary bypass graft: Secondary | ICD-10-CM | POA: Insufficient documentation

## 2017-02-13 HISTORY — DX: Chronic diastolic (congestive) heart failure: I50.32

## 2017-02-13 LAB — BASIC METABOLIC PANEL
ANION GAP: 10 (ref 5–15)
BUN: 28 mg/dL — AB (ref 6–20)
CALCIUM: 8.9 mg/dL (ref 8.9–10.3)
CO2: 27 mmol/L (ref 22–32)
Chloride: 105 mmol/L (ref 101–111)
Creatinine, Ser: 1.29 mg/dL — ABNORMAL HIGH (ref 0.44–1.00)
GFR calc Af Amer: 48 mL/min — ABNORMAL LOW (ref >60)
GFR, EST NON AFRICAN AMERICAN: 41 mL/min — AB (ref >60)
GLUCOSE: 64 mg/dL — AB (ref 65–99)
Potassium: 3 mmol/L — ABNORMAL LOW (ref 3.5–5.1)
SODIUM: 142 mmol/L (ref 135–145)

## 2017-02-13 NOTE — Progress Notes (Signed)
Patient ID: Tina Hayes, female    DOB: 27-Jul-1945, 71 y.o.   MRN: [id number]  HPI  Tina Hayes is a 71 y/o female with a history of seizures, HTN, cardiac stents, DM, depression, CAD, COPD, chronic back pain, anxiety, obesity, remote tobacco use and chronic heart failure.   Last echo was done 06/17/16 and showed an EF of 60-65% along with mild MR and moderate TR. Pulmonary systolic pressure was mildly to moderately elevated. Stress test done 06/18/16.  Admitted 01/29/17 due to atypical chest pain. Cardiology consult was obtained. Tina Hayes was discharged the next day. Was in the ED 11/13/16 due to left upper arm pain. Treated and released. Was in the ED 08/14/16 due to nausea, vomiting and diarrhea. Evaluated and discharged home.  Admitted 06/16/16 with chest pain. Had negative troponins. Cardiology consult done and patient underwent stress test which was read as low risk for ischemia. Medications were adjusted and Tina Hayes was discharged home after 2 days. Admitted 08/28/15 due to COPD exacerbation. Received IV steroids, nebulizers and antibiotics. Tina Hayes was discharged home after 2 days.   Tina Hayes presents today for Tina Hayes follow-up visit with a chief complaint of mild shortness of breath with moderate exertion. Tina Hayes describes this as improving since Tina Hayes was last here and has been present for several years with varying levels of severity. Tina Hayes has associated fatigue and chronic back pain along with this. Tina Hayes denies any chest pain, edema, palpitations, dizziness or difficulty sleeping.  Past Medical History:  Diagnosis Date  . (HFpEF) heart failure with preserved ejection fraction ([place])    a. 05/2016 Echo: EF 60-65%, mild to mod LVH, Gr1 DD, mild MR, mildly dil LA, mod TR, mildly to mod increased PASP.  [name] Anxiety   . Chronic back pain   . COPD (chronic obstructive pulmonary disease) ([place])   . Coronary artery disease    a. s/p remote PCI x 5;  b. 2006 s/p CABG x 3 ([place], [place]); b. 05/2016 MV:  attenuation corrected images w/o ischemia or wma-->Med rx.  . Depression   . Diabetes mellitus without complication ([place])   . Heart attack ([place])    Total of 3 per pt.  . Hypertension   . Seizures ([place])    Past Surgical History:  Procedure Laterality Date  . ANKLE SURGERY    . CORONARY ARTERY BYPASS GRAFT    . KNEE SURGERY     Family History  Problem Relation Age of Onset  . Diabetes Unknown   . Hypertension Unknown   . Diabetes Mother   . Heart failure Mother   . Heart disease Mother   . Heart attack Mother   . Stroke Mother   . Depression Mother   . Hypertension Mother   . Cancer Sister        brain  . Hypertension Sister   . Diabetes Brother   . Hypertension Brother   . Heart failure Sister   . Heart attack Sister   . SIDS Sister    Social History  Substance Use Topics  . Smoking status: Former Smoker    Packs/day: 0.25    Years: 20.00    Types: Cigarettes    Quit date: 07/01/2006  . Smokeless tobacco: Never Used  . Alcohol use No   Allergies  Allergen Reactions  . Aspirin Anaphylaxis   Prior to Admission medications   Medication Sig Start Date End Date Taking? Authorizing Provider  ACCU-CHEK AVIVA PLUS test strip  05/19/16  Yes [provider]  albuterol (PROVENTIL HFA;VENTOLIN HFA) 108 (90 Base) MCG/ACT inhaler Inhale 2 puffs into the lungs every 6 (six) hours as needed for wheezing or shortness of breath. 08/30/15  Yes Epifanio Lesches, MD  atorvastatin (LIPITOR) 40 MG tablet Take 40 mg by mouth daily.   Yes [provider]  Blood Glucose Monitoring Suppl (ACCU-CHEK AVIVA PLUS) w/Device KIT  06/13/16  Yes [provider]  carvedilol (COREG) 3.125 MG tablet Take 1 tablet (3.125 mg total) by mouth 2 (two) times daily with a meal. 08/12/16  Yes Hackney, Tina A, FNP  cetirizine (ZYRTEC) 10 MG tablet Take 10 mg by mouth daily.   Yes [provider]  citalopram (CELEXA) 20 MG tablet Take 20 mg by mouth daily.   Yes [provider]  clopidogrel (PLAVIX) 75 MG tablet Take 1 tablet (75 mg total) by mouth daily. 08/14/16  Yes Gollan, Kathlene November, MD  furosemide (LASIX) 40 MG tablet Take 40 mg by mouth daily.   Yes [provider]  hydrALAZINE (APRESOLINE) 25 MG tablet Take 25 mg by mouth 3 (three) times daily.   Yes [provider]  insulin aspart (NOVOLOG) 100 UNIT/ML injection Inject 5-10 Units into the skin 3 (three) times daily with meals. 10 units in the morning 5 units at lunch and 5 at night   Yes [provider]  insulin glargine (LANTUS) 100 UNIT/ML injection Inject into the skin 2 (two) times daily. 30 units AM and 40 units PM   Yes [provider]  ipratropium-albuterol (DUONEB) 0.5-2.5 (3) MG/3ML SOLN Take 3 mLs by nebulization every 6 (six) hours as needed (COPD).   Yes [provider]  isosorbide mononitrate (IMDUR) 30 MG 24 hr tablet Take 1 tablet (30 mg total) by mouth 2 (two) times daily. 08/12/16  Yes Darylene Price A, FNP  levETIRAcetam (KEPPRA) 750 MG tablet Take 1 tablet (750 mg total) by mouth 2 (two) times daily. 06/05/15  Yes Rai, Ripudeep K, MD  losartan (COZAAR) 100 MG tablet Take 1 tablet (100 mg total) by mouth daily. 08/12/16  Yes Hackney, Otila Kluver A, FNP  nitroGLYCERIN (NITROSTAT) 0.4 MG SL tablet Place 1 tablet (0.4 mg total) under the tongue every 5 (five) minutes as needed for chest pain. 07/01/16 07/01/17 Yes Gollan, Kathlene November, MD  omeprazole (PRILOSEC) 40 MG capsule Take 40 mg by mouth daily.   Yes [provider]  potassium chloride SA (K-DUR,KLOR-CON) 20 MEQ tablet Take 1 tablet (20 mEq total) by mouth daily. 08/12/16  Yes Darylene Price A, FNP  tiotropium (SPIRIVA) 18 MCG inhalation capsule Place 18 mcg into inhaler and inhale daily.   Yes [provider]  traMADol (ULTRAM) 50 MG tablet Take 2 tablets (100 mg total) by mouth every 6 (six) hours as needed for moderate pain. 01/22/17  Yes Molli Barrows, MD  traZODone (DESYREL) 50 MG  tablet Take 50 mg by mouth at bedtime.   Yes [provider]  UNIFINE PENTIPS 31G X 6 MM [place]  07/23/16  Yes [provider]   Review of Systems  Constitutional: Positive for fatigue ("much better"). Negative for appetite change.  HENT: Negative for congestion, postnasal drip, sore throat and trouble swallowing.   Eyes: Negative.   Respiratory: Positive for shortness of breath ("much better"). Negative for cough and chest tightness.   Cardiovascular: Negative for palpitations and leg swelling.  Gastrointestinal: Negative for abdominal distention, abdominal pain and nausea.  Endocrine: Negative.   Genitourinary: Negative.   Musculoskeletal:  Positive for arthralgias (left shoulder pain) and back pain (chronic). Negative for neck pain.  Skin: Negative.   Allergic/Immunologic: Negative.   Neurological: Negative for dizziness, weakness, light-headedness and headaches.  Hematological: Negative for adenopathy. Does not bruise/bleed easily.  Psychiatric/Behavioral: Positive for dysphoric mood. Negative for sleep disturbance (sleeping better) and suicidal ideas. The patient is not nervous/anxious.    Vitals:   02/13/17 0859  BP: (!) 95/48  Pulse: (!) 59  Resp: 18  SpO2: 100%  Weight: (!) 310 lb 4 oz (140.7 kg)  Height: 5' 11" (1.803 m)   Wt Readings from Last 3 Encounters:  02/13/17 (!) 310 lb 4 oz (140.7 kg)  02/02/17 299 lb (135.6 kg)  01/30/17 299 lb 4.8 oz (135.8 kg)    Lab Results  Component Value Date   CREATININE 1.72 (H) 02/02/2017   CREATININE 1.54 (H) 01/30/2017   CREATININE 1.34 (H) 01/29/2017   Physical Exam  Constitutional: Tina Hayes is oriented to person, place, and time. Tina Hayes appears well-developed and well-nourished.  HENT:  Head: Normocephalic and atraumatic.  Neck: Normal range of motion. Neck supple. No JVD present.  Cardiovascular: Regular rhythm.  Bradycardia present.   Pulmonary/Chest: Effort normal. Tina Hayes has no wheezes. Tina Hayes has no rales.   Abdominal: Soft. Tina Hayes exhibits no distension. There is no tenderness.  Musculoskeletal: Tina Hayes exhibits no edema or tenderness.  Neurological: Tina Hayes is alert and oriented to person, place, and time.  Skin: Skin is warm and dry.  Psychiatric: Tina Hayes has a normal mood and affect. Tina Hayes behavior is normal.  Nursing note and vitals reviewed.  Assessment & Plan:  1: Chronic heart failure with preserved ejection fraction- - NYHA class II - euvolemic today - weighing daily and says that Tina Hayes home weight continues to decline.  Reminded to call for an overnight weight gain of >2 pounds or a weekly weight gain of >5 pounds.  - weight up 11 pounds by our scale since the last time Tina Hayes was here   - has been trying to keep Tina Hayes fluid intake to 40-60 ounces of fluid daily.   - saw Tina Hayes cardiologist Rockey Situ) 10/02/16 and returns 02/17/17 - draw a BMP today - feeling better so is now walking twice daily for 30 minutes each time  2: HTN- - BP remains on the low side - BMP from 02/02/17 reviewed; potassium 4.1 and GFR 34 - feeling better since stopping the amlodipine; consider stopping hydralazine at Tina Hayes next visit if BP remains low - saw PCP Ancil Boozer) 02/05/17   3: Diabetes-  - glucose at home today was 94  4: Depression-  - patient smiling in the office today as Tina Hayes says that Tina Hayes feels so much better  5: Dysphagia- - doesn't feel like this is as bad since resuming Tina Hayes omeprazole - drinking 2 ensure daily   Reviewed medication bottles with the patient.  Return in 2 weeks or sooner for any questions/ problems before then.

## 2017-02-13 NOTE — Telephone Encounter (Signed)
Spoke with patient regarding lab work that was done today (02/13/17). Renal function is improving. Potassium level has declined to 3.0.  She is currently taking 57meq of potassium daily and she was instructed to increase her potassium to 62meq daily. She can take all 35meq at one time or split the dose into 32meq twice daily.  She is to take 39meq daily until her return and we will check labs at that time. Patient verbalized understanding of medication change.

## 2017-02-13 NOTE — Progress Notes (Signed)
Cardiology Office Note  Date:  02/17/2017   ID:  Tina Hayes, DOB 05/02/1945, MRN [id number]  PCP:  Herminio Commons, MD   Chief Complaint  Patient presents with  . other    TCM for [facility] ph f/u for chest pain. Pt c/o chest pain, sob, dysphagia. Reviewed meds with pt verbally.    HPI:   71 y.o. 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  Vomiting, unable to get any food or water down  Eventually piece of meat removed and she was able to swallow  history of GERD, she had ran out of her PPI  Potassium 3.0 4 days ago Now on two potassium a day,  Previously was on one  She has been following a very bland diet since discharge, soft mechanical food, yogurt and applesauce with Jell-O  Shortness of breath stable For COPD has nebs, albuterol and spiriva  Lab work reviewed with her in detail Total chol 99, LDL 40 HBA1C 8.5, (up to 9 recently)  EKG personally reviewed by myself on today's visit shows normal sinus rhythm rate 59 bpm with nonspecific ST abnormality in lead V6, 1 and aVL No change from previous EKGs  PMH:   has a past medical history of (HFpEF) heart failure with preserved ejection fraction ([place]); Anxiety; Chronic back pain; COPD (chronic obstructive pulmonary disease) ([place]); Coronary artery disease;  Depression; Diabetes mellitus without complication ([place]); Heart attack ([place]); Hypertension; and Seizures ([place]).  PSH:    Past Surgical History:  Procedure Laterality Date  . ANKLE SURGERY    . CORONARY ARTERY BYPASS GRAFT    . KNEE SURGERY      Current Outpatient Prescriptions  Medication Sig Dispense Refill  . ACCU-CHEK AVIVA PLUS test strip     . albuterol (PROVENTIL HFA;VENTOLIN HFA) 108 (90 Base) MCG/ACT inhaler Inhale 2 puffs into the lungs every 6 (six) hours as needed for wheezing or shortness of breath. 1 Inhaler 2  . atorvastatin (LIPITOR) 40 MG tablet Take 40 mg by mouth daily.    . Blood Glucose Monitoring Suppl (ACCU-CHEK AVIVA PLUS) w/Device KIT     . carvedilol (COREG) 3.125 MG tablet Take 1 tablet (3.125 mg total) by mouth 2 (two) times daily with a meal. 180 tablet 3  . cetirizine (ZYRTEC) 10 MG tablet Take 10 mg by mouth daily.    . citalopram (CELEXA) 20 MG tablet Take 20 mg by mouth daily.    . clopidogrel (PLAVIX) 75 MG tablet Take 1 tablet (75 mg total) by mouth daily. 30 tablet 0  . furosemide (LASIX) 40 MG tablet Take 40 mg by mouth daily.    . hydrALAZINE (APRESOLINE) 25 MG tablet Take 25 mg by mouth 3 (three) times daily.    . insulin aspart (NOVOLOG)  100 UNIT/ML injection Inject 5-10 Units into the skin 3 (three) times daily with meals. 10 units in the morning 5 units at lunch and 5 at night    . insulin glargine (LANTUS) 100 UNIT/ML injection Inject into the skin 2 (two) times daily. 30 units AM and 40 units PM    . ipratropium-albuterol (DUONEB) 0.5-2.5 (3) MG/3ML SOLN Take 3 mLs by nebulization every 6 (six) hours as needed (COPD).    [name] isosorbide mononitrate (IMDUR) 30 MG 24 hr tablet Take 1 tablet (30 mg total) by mouth 2 (two) times daily. 180 tablet 3  . levETIRAcetam (KEPPRA) 750 MG tablet Take 1 tablet (750 mg total) by mouth 2 (two) times daily. 60 tablet 3  . losartan (COZAAR) 100 MG tablet Take 1 tablet (100 mg total) by mouth daily. 90 tablet 3  .  nitroGLYCERIN (NITROSTAT) 0.4 MG SL tablet Place 1 tablet (0.4 mg total) under the tongue every 5 (five) minutes as needed for chest pain. 25 tablet 6  . omeprazole (PRILOSEC) 40 MG capsule Take 40 mg by mouth daily.    . potassium chloride SA (K-DUR,KLOR-CON) 20 MEQ tablet Take 20 mEq by mouth 2 (two) times daily.    [name] tiotropium (SPIRIVA) 18 MCG inhalation capsule Place 18 mcg into inhaler and inhale daily.    . traMADol (ULTRAM) 50 MG tablet Take 2 tablets (100 mg total) by mouth every 6 (six) hours as needed for moderate pain. 180 tablet 2  . traZODone (DESYREL) 50 MG tablet Take 50 mg by mouth at bedtime.    [name] UNIFINE PENTIPS 31G X 6 MM MISC      No current facility-administered medications for this visit.      Allergies:   Aspirin   Social History:  The patient  reports that she quit smoking about 10 years ago. Her smoking use included Cigarettes. She has a 5.00 pack-year smoking history. She has never used smokeless tobacco. She reports that she does not drink alcohol or use drugs.   Family History:   family history includes Cancer in her sister; Depression in her mother; Diabetes in her brother, mother, and unknown relative; Heart attack in her mother and sister; Heart disease in her mother; Heart failure in her mother and sister; Hypertension in her brother, mother, sister, and unknown relative; SIDS in her sister; Stroke in her mother.    Review of Systems: Review of Systems  Constitutional: Negative.   Respiratory: Positive for shortness of breath.   Cardiovascular: Negative.   Gastrointestinal: Negative.        Food sticking in her throat  Musculoskeletal: Negative.   Neurological: Negative.   Psychiatric/Behavioral: Negative.   All other systems reviewed and are negative.    PHYSICAL EXAM: VS:  BP (!) 148/76 (BP Location: Left Arm, Patient Position: Sitting, Cuff Size: Normal)   Pulse (!) 59   Ht _0  (1.803 m)   Wt (!) 311 lb 4 oz (141.2 kg)   BMI 43.41 kg/m  ,  BMI Body mass index is 43.41 kg/m. No significant change from previous exam visit GEN: Well nourished, well developed, in no acute distress, obese HEENT: normal  Neck: no JVD, carotid bruits, or masses Cardiac: RRR; no murmurs, rubs, or gallops, trace nonpitting leg edema  Respiratory:  clear to auscultation bilaterally, normal work of breathing GI: soft, nontender, nondistended, + BS MS: no deformity or atrophy  Skin: warm and dry, no rash Neuro:  Strength and sensation are intact Psych: euthymic mood,  full affect    Recent Labs: 06/16/2016: Magnesium 1.8 08/14/2016: ALT 15 01/29/2017: B Natriuretic Peptide 38.0 01/30/2017: Hemoglobin 11.9; Platelets 302 02/13/2017: BUN 28; Creatinine, Ser 1.29; Potassium 3.0; Sodium 142    Lipid Panel Lab Results  Component Value Date   CHOL 120 01/30/2017   HDL 47 01/30/2017   LDLCALC 58 01/30/2017   TRIG 73 01/30/2017      Wt Readings from Last 3 Encounters:  02/17/17 (!) 311 lb 4 oz (141.2 kg)  02/13/17 (!) 310 lb 4 oz (140.7 kg)  02/02/17 299 lb (135.6 kg)       ASSESSMENT AND PLAN:   Essential hypertension - Plan: EKG 12-Lead No changes made to the medications.  slightly elevated, recommended she monitor closely at home  CAD in native artery - Plan: EKG 12-Lead negative stress test, February 2018  Recent admission to the hospital for atypical chest pain, piece of meat stuck in her esophagus with radiating pain to both arms She has follow-up with GI  Chronic diastolic congestive heart failure ([place]) - Plan: EKG 12-Lead  on Lasix 40 mg daily with potassium twice a day  No significant ankle swelling appears relatively euvolemic.   Chest pain  Plan: EKG 12-Lead Recent hospitalization for GI previously with musculoskeletal disorder Recent admission for piece of meat stuck in her throat Suspect stricture  Morbid obesity ([place]) - Plan: EKG 12-Lead Recommended low carbohydrate diet, avoid breads Unable to exercise secondary  to chronic back pain Weight is in fact going up  Osteoarthritis knee   acceptable risk for knee surgery  Diabetes type 2, poorly controlled Long time spent discussing her diet reserve recommended she decrease her potatoes and bread  Disposition:   F/U  12 months   Total encounter time more than 45 minutes  Greater than 50% was spent in counseling and coordination of care with the patient    Orders Placed This Encounter  Procedures  . EKG 12-Lead     Signed, Esmond Plants, M.D., Ph.D. 02/17/2017  [place], [place]

## 2017-02-13 NOTE — Patient Instructions (Signed)
Continue weighing daily and call for an overnight weight gain of > 2 pounds or a weekly weight gain of >5 pounds. 

## 2017-02-17 ENCOUNTER — Ambulatory Visit (INDEPENDENT_AMBULATORY_CARE_PROVIDER_SITE_OTHER): Payer: Medicare HMO | Admitting: Cardiovascular Disease

## 2017-02-17 ENCOUNTER — Encounter: Payer: Self-pay | Admitting: Cardiovascular Disease

## 2017-02-17 VITALS — BP 148/76 | HR 59 | Ht 71.0 in | Wt 311.2 lb

## 2017-02-17 DIAGNOSIS — I10 Essential (primary) hypertension: Secondary | ICD-10-CM

## 2017-02-17 DIAGNOSIS — J432 Centrilobular emphysema: Secondary | ICD-10-CM | POA: Diagnosis not present

## 2017-02-17 DIAGNOSIS — I25118 Atherosclerotic heart disease of native coronary artery with other forms of angina pectoris: Secondary | ICD-10-CM | POA: Diagnosis not present

## 2017-02-17 DIAGNOSIS — I5032 Chronic diastolic (congestive) heart failure: Secondary | ICD-10-CM

## 2017-02-17 NOTE — Patient Instructions (Addendum)

## 2017-02-23 ENCOUNTER — Encounter: Payer: Self-pay | Admitting: Anesthesiology

## 2017-02-23 ENCOUNTER — Ambulatory Visit: Payer: Medicare HMO | Attending: Anesthesiology | Admitting: Anesthesiology

## 2017-02-23 VITALS — BP 150/66 | HR 63 | Temp 97.8°F | Resp 16 | Ht 71.0 in | Wt 317.0 lb

## 2017-02-23 DIAGNOSIS — M5432 Sciatica, left side: Secondary | ICD-10-CM | POA: Diagnosis not present

## 2017-02-23 DIAGNOSIS — M25512 Pain in left shoulder: Secondary | ICD-10-CM | POA: Insufficient documentation

## 2017-02-23 DIAGNOSIS — E108 Type 1 diabetes mellitus with unspecified complications: Secondary | ICD-10-CM

## 2017-02-23 DIAGNOSIS — Z79899 Other long term (current) drug therapy: Secondary | ICD-10-CM | POA: Diagnosis not present

## 2017-02-23 DIAGNOSIS — M5431 Sciatica, right side: Secondary | ICD-10-CM | POA: Insufficient documentation

## 2017-02-23 DIAGNOSIS — M5136 Other intervertebral disc degeneration, lumbar region: Secondary | ICD-10-CM | POA: Diagnosis not present

## 2017-02-23 DIAGNOSIS — M170 Bilateral primary osteoarthritis of knee: Secondary | ICD-10-CM | POA: Diagnosis not present

## 2017-02-23 DIAGNOSIS — M4697 Unspecified inflammatory spondylopathy, lumbosacral region: Secondary | ICD-10-CM

## 2017-02-23 DIAGNOSIS — E1065 Type 1 diabetes mellitus with hyperglycemia: Secondary | ICD-10-CM | POA: Insufficient documentation

## 2017-02-23 DIAGNOSIS — IMO0002 Reserved for concepts with insufficient information to code with codable children: Secondary | ICD-10-CM

## 2017-02-23 DIAGNOSIS — M47817 Spondylosis without myelopathy or radiculopathy, lumbosacral region: Secondary | ICD-10-CM

## 2017-02-23 DIAGNOSIS — M545 Low back pain: Secondary | ICD-10-CM | POA: Diagnosis present

## 2017-02-23 NOTE — Progress Notes (Signed)
Subjective:  Patient ID: Tina Hayes, female    DOB: 08-04-1945  Age: 71 y.o. MRN: [id number]  CC: Back Pain (lower middle more on the left)   Procedure: None   HPI Tina Hayes presents for reevaluation. She was last seen a few months ago and continues to have low back pain of the same quality characteristic condition region. She freely gets radiating pain into the bilateral buttocks down the posterior and lateral legs and into the calves. This pain is been more intense over the past month or so. She takes occasional tramadol to help with pain relief and Tylenol in the evenings. No change in strength or bowel or bladder function has been noted but she has had some recent worsening in her pain syndrome. Otherwise she is in her usual state of health.  Outpatient Medications Prior to Visit  Medication Sig Dispense Refill  . ACCU-CHEK AVIVA PLUS test strip     . albuterol (PROVENTIL HFA;VENTOLIN HFA) 108 (90 Base) MCG/ACT inhaler Inhale 2 puffs into the lungs every 6 (six) hours as needed for wheezing or shortness of breath. 1 Inhaler 2  . atorvastatin (LIPITOR) 40 MG tablet Take 40 mg by mouth daily.    . Blood Glucose Monitoring Suppl (ACCU-CHEK AVIVA PLUS) w/Device KIT     . carvedilol (COREG) 3.125 MG tablet Take 1 tablet (3.125 mg total) by mouth 2 (two) times daily with a meal. 180 tablet 3  . cetirizine (ZYRTEC) 10 MG tablet Take 10 mg by mouth daily.    . citalopram (CELEXA) 20 MG tablet Take 20 mg by mouth daily.    . clopidogrel (PLAVIX) 75 MG tablet Take 1 tablet (75 mg total) by mouth daily. 30 tablet 0  . furosemide (LASIX) 40 MG tablet Take 40 mg by mouth daily.    . hydrALAZINE (APRESOLINE) 25 MG tablet Take 25 mg by mouth 3 (three) times daily.    . insulin aspart (NOVOLOG) 100 UNIT/ML injection Inject 5-10 Units into the skin 3 (three) times daily with meals. 10 units in the morning 5 units at lunch and 5 at night    . insulin glargine (LANTUS) 100 UNIT/ML injection Inject  into the skin 2 (two) times daily. 30 units AM and 40 units PM    . ipratropium-albuterol (DUONEB) 0.5-2.5 (3) MG/3ML SOLN Take 3 mLs by nebulization every 6 (six) hours as needed (COPD).    [name] isosorbide mononitrate (IMDUR) 30 MG 24 hr tablet Take 1 tablet (30 mg total) by mouth 2 (two) times daily. 180 tablet 3  . levETIRAcetam (KEPPRA) 750 MG tablet Take 1 tablet (750 mg total) by mouth 2 (two) times daily. 60 tablet 3  . losartan (COZAAR) 100 MG tablet Take 1 tablet (100 mg total) by mouth daily. 90 tablet 3  . nitroGLYCERIN (NITROSTAT) 0.4 MG SL tablet Place 1 tablet (0.4 mg total) under the tongue every 5 (five) minutes as needed for chest pain. 25 tablet 6  . omeprazole (PRILOSEC) 40 MG capsule Take 40 mg by mouth daily.    . potassium chloride SA (K-DUR,KLOR-CON) 20 MEQ tablet Take 20 mEq by mouth 2 (two) times daily.    [name] tiotropium (SPIRIVA) 18 MCG inhalation capsule Place 18 mcg into inhaler and inhale daily.    . traMADol (ULTRAM) 50 MG tablet Take 2 tablets (100 mg total) by mouth every 6 (six) hours as needed for moderate pain. 180 tablet 2  . traZODone (DESYREL) 50 MG tablet Take 50 mg by mouth at  bedtime.    [name] UNIFINE PENTIPS 31G X 6 MM MISC      No facility-administered medications prior to visit.     Review of Systems CNS: No sedation or confusion Cardiac: No angina or palpitations GI: No constipation or abdominal pain  Objective:  BP (!) 150/66 (BP Location: Left Arm, Patient Position: Sitting, Cuff Size: Normal)   Pulse 63   Temp 97.8 F (36.6 C) (Oral)   Resp 16   Ht '5\' 11"'  (1.803 m)   Wt (!) 317 lb (143.8 kg)   SpO2 100%   BMI 44.21 kg/m    BP Readings from Last 3 Encounters:  02/23/17 (!) 150/66  02/17/17 (!) 148/76  02/13/17 (!) 95/48     Wt Readings from Last 3 Encounters:  02/23/17 (!) 317 lb (143.8 kg)  02/17/17 (!) 311 lb 4 oz (141.2 kg)  02/13/17 (!) 310 lb 4 oz (140.7 kg)     Physical Exam Pt is alert and oriented PERRL EOMI HEART IS RRR  no murmur or rub LCTA no wheezing or rhales MUSCULOSKELETAL reveals some persistent paraspinous muscle tenderness. She remains morbidly obese and has a positive straight leg raise on the left side negative on the right. Her muscle tone and bulk is good.  Labs  Lab Results  Component Value Date   HGBA1C 8.5 (H) 01/29/2017   HGBA1C 8.5 (H) 06/17/2016   HGBA1C 8.0 (H) 06/03/2015   Lab Results  Component Value Date   LDLCALC 58 01/30/2017   CREATININE 1.29 (H) 02/13/2017    -------------------------------------------------------------------------------------------------------------------- Lab Results  Component Value Date   WBC 8.1 01/30/2017   HGB 11.9 (L) 01/30/2017   HCT 35.7 01/30/2017   PLT 302 01/30/2017   GLUCOSE 64 (L) 02/13/2017   CHOL 120 01/30/2017   TRIG 73 01/30/2017   HDL 47 01/30/2017   LDLCALC 58 01/30/2017   ALT 15 08/14/2016   AST 18 08/14/2016   NA 142 02/13/2017   K 3.0 (L) 02/13/2017   CL 105 02/13/2017   CREATININE 1.29 (H) 02/13/2017   BUN 28 (H) 02/13/2017   CO2 27 02/13/2017   INR 0.99 11/13/2016   HGBA1C 8.5 (H) 01/29/2017    --------------------------------------------------------------------------------------------------------------------- Dg Chest 1 View  Result Date: 01/29/2017 CLINICAL DATA:  Chest pressure EXAM: CHEST 1 VIEW COMPARISON:  11/13/2016 FINDINGS: Post sternotomy changes. Faintly visualized coronary stent or calcification. Mildly low lung volumes. Atelectasis at the left base. Moderate hiatal hernia. Mild cardiomegaly. Negative for acute infiltrate, pleural effusion or pneumothorax IMPRESSION: 1. Mild cardiomegaly 2. Mild atelectasis at the left base 3. Hiatal hernia Electronically Signed   By: Donavan Foil M.D.   On: 01/29/2017 15:04     Assessment & Plan:   Tina Hayes was seen today for back pain.  Diagnoses and all orders for this visit:  Bilateral sciatica -     Lumbar Epidural Injection; Future  Facet arthritis of  lumbosacral region [facility])  Primary osteoarthritis of both knees  DDD (degenerative disc disease), lumbar  Pain in joint of left shoulder  Type I diabetes mellitus with complication, uncontrolled ([place])        ----------------------------------------------------------------------------------------------------------------------  Problem List Items Addressed This Visit    None    Visit Diagnoses    Bilateral sciatica    -  Primary   Relevant Orders   Lumbar Epidural Injection   Facet arthritis of lumbosacral region ([place])       Primary osteoarthritis of both knees       DDD (  degenerative disc disease), lumbar       Pain in joint of left shoulder       Type I diabetes mellitus with complication, uncontrolled ([place])            ----------------------------------------------------------------------------------------------------------------------  1. Bilateral sciatica I'm going to schedule her for repeat epidural in approximately a month. We have gone over the risks and benefits of the procedure with her in full detail and all of her questions about answered. She has done well with these in the past early getting 75% improvement lasting several weeks. - Lumbar Epidural Injection; Future  2. Facet arthritis of lumbosacral region ([place])   3. Primary osteoarthritis of both knees   4. DDD (degenerative disc disease), lumbar Return to clinic as mentioned and continue efforts at weight loss and lower extremity strength.  5. Pain in joint of left shoulder   6. Type I diabetes mellitus with complication, uncontrolled ([place])     ----------------------------------------------------------------------------------------------------------------------  I am having Ms. Clegg maintain her tiotropium, levETIRAcetam, traZODone, albuterol, cetirizine, citalopram, nitroGLYCERIN, ACCU-CHEK AVIVA PLUS, ACCU-CHEK AVIVA PLUS, UNIFINE PENTIPS, insulin aspart, losartan, carvedilol, isosorbide  mononitrate, clopidogrel, traMADol, ipratropium-albuterol, atorvastatin, furosemide, insulin glargine, hydrALAZINE, omeprazole, and potassium chloride SA.   No orders of the defined types were placed in this encounter.  Patient's Medications  New Prescriptions   No medications on file  Previous Medications   ACCU-CHEK AVIVA PLUS TEST STRIP       ALBUTEROL (PROVENTIL HFA;VENTOLIN HFA) 108 (90 BASE) MCG/ACT INHALER    Inhale 2 puffs into the lungs every 6 (six) hours as needed for wheezing or shortness of breath.   ATORVASTATIN (LIPITOR) 40 MG TABLET    Take 40 mg by mouth daily.   BLOOD GLUCOSE MONITORING SUPPL (ACCU-CHEK AVIVA PLUS) W/DEVICE KIT       CARVEDILOL (COREG) 3.125 MG TABLET    Take 1 tablet (3.125 mg total) by mouth 2 (two) times daily with a meal.   CETIRIZINE (ZYRTEC) 10 MG TABLET    Take 10 mg by mouth daily.   CITALOPRAM (CELEXA) 20 MG TABLET    Take 20 mg by mouth daily.   CLOPIDOGREL (PLAVIX) 75 MG TABLET    Take 1 tablet (75 mg total) by mouth daily.   FUROSEMIDE (LASIX) 40 MG TABLET    Take 40 mg by mouth daily.   HYDRALAZINE (APRESOLINE) 25 MG TABLET    Take 25 mg by mouth 3 (three) times daily.   INSULIN ASPART (NOVOLOG) 100 UNIT/ML INJECTION    Inject 5-10 Units into the skin 3 (three) times daily with meals. 10 units in the morning 5 units at lunch and 5 at night   INSULIN GLARGINE (LANTUS) 100 UNIT/ML INJECTION    Inject into the skin 2 (two) times daily. 30 units AM and 40 units PM   IPRATROPIUM-ALBUTEROL (DUONEB) 0.5-2.5 (3) MG/3ML SOLN    Take 3 mLs by nebulization every 6 (six) hours as needed (COPD).   ISOSORBIDE MONONITRATE (IMDUR) 30 MG 24 HR TABLET    Take 1 tablet (30 mg total) by mouth 2 (two) times daily.   LEVETIRACETAM (KEPPRA) 750 MG TABLET    Take 1 tablet (750 mg total) by mouth 2 (two) times daily.   LOSARTAN (COZAAR) 100 MG TABLET    Take 1 tablet (100 mg total) by mouth daily.   NITROGLYCERIN (NITROSTAT) 0.4 MG SL TABLET    Place 1 tablet (0.4 mg  total) under the tongue every 5 (five) minutes as needed for  chest pain.   OMEPRAZOLE (PRILOSEC) 40 MG CAPSULE    Take 40 mg by mouth daily.   POTASSIUM CHLORIDE SA (K-DUR,KLOR-CON) 20 MEQ TABLET    Take 20 mEq by mouth 2 (two) times daily.   TIOTROPIUM (SPIRIVA) 18 MCG INHALATION CAPSULE    Place 18 mcg into inhaler and inhale daily.   TRAMADOL (ULTRAM) 50 MG TABLET    Take 2 tablets (100 mg total) by mouth every 6 (six) hours as needed for moderate pain.   TRAZODONE (DESYREL) 50 MG TABLET    Take 50 mg by mouth at bedtime.   UNIFINE PENTIPS 31G X 6 MM MISC      Modified Medications   No medications on file  Discontinued Medications   No medications on file   ----------------------------------------------------------------------------------------------------------------------  Follow-up: Return in about 1 month (around 03/26/2017) for evaluation, procedure.    Molli Barrows, MD

## 2017-02-23 NOTE — Progress Notes (Signed)
Nursing Pain Medication Assessment:  Safety precautions to be maintained throughout the outpatient stay will include: orient to surroundings, keep bed in low position, maintain call bell within reach at all times, provide assistance with transfer out of bed and ambulation.  Medication Inspection Compliance: Ms. Cianci did not comply with our request to bring her pills to be counted. She was reminded that bringing the medication bottles, even when empty, is a requirement.  Medication: None brought in. Pill/Patch Count: None available to be counted. Bottle Appearance: No container available. Did not bring bottle(s) to appointment. Filled Date: N/A Last Medication intake:  Yesterday 

## 2017-02-23 NOTE — Patient Instructions (Signed)
GENERAL RISKS AND COMPLICATIONS  What are the risk, side effects and possible complications? Generally speaking, most procedures are safe.  However, with any procedure there are risks, side effects, and the possibility of complications.  The risks and complications are dependent upon the sites that are lesioned, or the type of nerve block to be performed.  The closer the procedure is to the spine, the more serious the risks are.  Great care is taken when placing the radio frequency needles, block needles or lesioning probes, but sometimes complications can occur. 1. Infection: Any time there is an injection through the skin, there is a risk of infection.  This is why sterile conditions are used for these blocks.  There are four possible types of infection. 1. Localized skin infection. 2. Central Nervous System Infection-This can be in the form of Meningitis, which can be deadly. 3. Epidural Infections-This can be in the form of an epidural abscess, which can cause pressure inside of the spine, causing compression of the spinal cord with subsequent paralysis. This would require an emergency surgery to decompress, and there are no guarantees that the patient would recover from the paralysis. 4. Discitis-This is an infection of the intervertebral discs.  It occurs in about 1% of discography procedures.  It is difficult to treat and it may lead to surgery.        2. Pain: the needles have to go through skin and soft tissues, will cause soreness.       3. Damage to internal structures:  The nerves to be lesioned may be near blood vessels or    other nerves which can be potentially damaged.       4. Bleeding: Bleeding is more common if the patient is taking blood thinners such as  aspirin, Coumadin, Ticiid, Plavix, etc., or if he/she have some genetic predisposition  such as hemophilia. Bleeding into the spinal canal can cause compression of the spinal  cord with subsequent paralysis.  This would require an  emergency surgery to  decompress and there are no guarantees that the patient would recover from the  paralysis.       5. Pneumothorax:  Puncturing of a lung is a possibility, every time a needle is introduced in  the area of the chest or upper back.  Pneumothorax refers to free air around the  collapsed lung(s), inside of the thoracic cavity (chest cavity).  Another two possible  complications related to a similar event would include: Hemothorax and Chylothorax.   These are variations of the Pneumothorax, where instead of air around the collapsed  lung(s), you may have blood or chyle, respectively.       6. Spinal headaches: They may occur with any procedures in the area of the spine.       7. Persistent CSF (Cerebro-Spinal Fluid) leakage: This is a rare problem, but may occur  with prolonged intrathecal or epidural catheters either due to the formation of a fistulous  track or a dural tear.       8. Nerve damage: By working so close to the spinal cord, there is always a possibility of  nerve damage, which could be as serious as a permanent spinal cord injury with  paralysis.       9. Death:  Although rare, severe deadly allergic reactions known as "Anaphylactic  reaction" can occur to any of the medications used.      10. Worsening of the symptoms:  We can always make thing worse.    What are the chances of something like this happening? Chances of any of this occuring are extremely low.  By statistics, you have more of a chance of getting killed in a motor vehicle accident: while driving to the hospital than any of the above occurring .  Nevertheless, you should be aware that they are possibilities.  In general, it is similar to taking a shower.  Everybody knows that you can slip, hit your head and get killed.  Does that mean that you should not shower again?  Nevertheless always keep in mind that statistics do not mean anything if you happen to be on the wrong side of them.  Even if a procedure has a 1  (one) in a 1,000,000 (million) chance of going wrong, it you happen to be that one..Also, keep in mind that by statistics, you have more of a chance of having something go wrong when taking medications.  Who should not have this procedure? If you are on a blood thinning medication (e.g. Coumadin, Plavix, see list of "Blood Thinners"), or if you have an active infection going on, you should not have the procedure.  If you are taking any blood thinners, please inform your physician.  How should I prepare for this procedure?  Do not eat or drink anything at least six hours prior to the procedure.  Bring a driver with you .  It cannot be a taxi.  Come accompanied by an adult that can drive you back, and that is strong enough to help you if your legs get weak or numb from the local anesthetic.  Take all of your medicines the morning of the procedure with just enough water to swallow them.  If you have diabetes, make sure that you are scheduled to have your procedure done first thing in the morning, whenever possible.  If you have diabetes, take only half of your insulin dose and notify our nurse that you have done so as soon as you arrive at the clinic.  If you are diabetic, but only take blood sugar pills (oral hypoglycemic), then do not take them on the morning of your procedure.  You may take them after you have had the procedure.  Do not take aspirin or any aspirin-containing medications, at least eleven (11) days prior to the procedure.  They may prolong bleeding.  Wear loose fitting clothing that may be easy to take off and that you would not mind if it got stained with Betadine or blood.  Do not wear any jewelry or perfume  Remove any nail coloring.  It will interfere with some of our monitoring equipment.  NOTE: Remember that this is not meant to be interpreted as a complete list of all possible complications.  Unforeseen problems may occur.  BLOOD THINNERS The following drugs  contain aspirin or other products, which can cause increased bleeding during surgery and should not be taken for 2 weeks prior to and 1 week after surgery.  If you should need take something for relief of minor pain, you may take acetaminophen which is found in Tylenol,m Datril, Anacin-3 and Panadol. It is not blood thinner. The products listed below are.  Do not take any of the products listed below in addition to any listed on your instruction sheet.  A.P.C or A.P.C with Codeine Codeine Phosphate Capsules #3 Ibuprofen Ridaura  ABC compound Congesprin Imuran rimadil  Advil Cope Indocin Robaxisal  Alka-Seltzer Effervescent Pain Reliever and Antacid Coricidin or Coricidin-D  Indomethacin Rufen    Alka-Seltzer plus Cold Medicine Cosprin Ketoprofen S-A-C Tablets  Anacin Analgesic Tablets or Capsules Coumadin Korlgesic Salflex  Anacin Extra Strength Analgesic tablets or capsules CP-2 Tablets Lanoril Salicylate  Anaprox Cuprimine Capsules Levenox Salocol  Anexsia-D Dalteparin Magan Salsalate  Anodynos Darvon compound Magnesium Salicylate Sine-off  Ansaid Dasin Capsules Magsal Sodium Salicylate  Anturane Depen Capsules Marnal Soma  APF Arthritis pain formula Dewitt's Pills Measurin Stanback  Argesic Dia-Gesic Meclofenamic Sulfinpyrazone  Arthritis Bayer Timed Release Aspirin Diclofenac Meclomen Sulindac  Arthritis pain formula Anacin Dicumarol Medipren Supac  Analgesic (Safety coated) Arthralgen Diffunasal Mefanamic Suprofen  Arthritis Strength Bufferin Dihydrocodeine Mepro Compound Suprol  Arthropan liquid Dopirydamole Methcarbomol with Aspirin Synalgos  ASA tablets/Enseals Disalcid Micrainin Tagament  Ascriptin Doan's Midol Talwin  Ascriptin A/D Dolene Mobidin Tanderil  Ascriptin Extra Strength Dolobid Moblgesic Ticlid  Ascriptin with Codeine Doloprin or Doloprin with Codeine Momentum Tolectin  Asperbuf Duoprin Mono-gesic Trendar  Aspergum Duradyne Motrin or Motrin IB Triminicin  Aspirin  plain, buffered or enteric coated Durasal Myochrisine Trigesic  Aspirin Suppositories Easprin Nalfon Trillsate  Aspirin with Codeine Ecotrin Regular or Extra Strength Naprosyn Uracel  Atromid-S Efficin Naproxen Ursinus  Auranofin Capsules Elmiron Neocylate Vanquish  Axotal Emagrin Norgesic Verin  Azathioprine Empirin or Empirin with Codeine Normiflo Vitamin E  Azolid Emprazil Nuprin Voltaren  Bayer Aspirin plain, buffered or children's or timed BC Tablets or powders Encaprin Orgaran Warfarin Sodium  Buff-a-Comp Enoxaparin Orudis Zorpin  Buff-a-Comp with Codeine Equegesic Os-Cal-Gesic   Buffaprin Excedrin plain, buffered or Extra Strength Oxalid   Bufferin Arthritis Strength Feldene Oxphenbutazone   Bufferin plain or Extra Strength Feldene Capsules Oxycodone with Aspirin   Bufferin with Codeine Fenoprofen Fenoprofen Pabalate or Pabalate-SF   Buffets II Flogesic Panagesic   Buffinol plain or Extra Strength Florinal or Florinal with Codeine Panwarfarin   Buf-Tabs Flurbiprofen Penicillamine   Butalbital Compound Four-way cold tablets Penicillin   Butazolidin Fragmin Pepto-Bismol   Carbenicillin Geminisyn Percodan   Carna Arthritis Reliever Geopen Persantine   Carprofen Gold's salt Persistin   Chloramphenicol Goody's Phenylbutazone   Chloromycetin Haltrain Piroxlcam   Clmetidine heparin Plaquenil   Cllnoril Hyco-pap Ponstel   Clofibrate Hydroxy chloroquine Propoxyphen         Before stopping any of these medications, be sure to consult the physician who ordered them.  Some, such as Coumadin (Warfarin) are ordered to prevent or treat serious conditions such as "deep thrombosis", "pumonary embolisms", and other heart problems.  The amount of time that you may need off of the medication may also vary with the medication and the reason for which you were taking it.  If you are taking any of these medications, please make sure you notify your pain physician before you undergo any  procedures.         Epidural Steroid Injection Patient Information  Description: The epidural space surrounds the nerves as they exit the spinal cord.  In some patients, the nerves can be compressed and inflamed by a bulging disc or a tight spinal canal (spinal stenosis).  By injecting steroids into the epidural space, we can bring irritated nerves into direct contact with a potentially helpful medication.  These steroids act directly on the irritated nerves and can reduce swelling and inflammation which often leads to decreased pain.  Epidural steroids may be injected anywhere along the spine and from the neck to the low back depending upon the location of your pain.   After numbing the skin with local anesthetic (like Novocaine), a small needle is passed   into the epidural space slowly.  You may experience a sensation of pressure while this is being done.  The entire block usually last less than 10 minutes.  Conditions which may be treated by epidural steroids:   Low back and leg pain  Neck and arm pain  Spinal stenosis  Post-laminectomy syndrome  Herpes zoster (shingles) pain  Pain from compression fractures  Preparation for the injection:  1. Do not eat any solid food or dairy products within 8 hours of your appointment.  2. You may drink clear liquids up to 3 hours before appointment.  Clear liquids include water, black coffee, juice or soda.  No milk or cream please. 3. You may take your regular medication, including pain medications, with a sip of water before your appointment  Diabetics should hold regular insulin (if taken separately) and take 1/2 normal NPH dos the morning of the procedure.  Carry some sugar containing items with you to your appointment. 4. A driver must accompany you and be prepared to drive you home after your procedure.  5. Bring all your current medications with your. 6. An IV may be inserted and sedation may be given at the discretion of the  physician.   7. A blood pressure cuff, EKG and other monitors will often be applied during the procedure.  Some patients may need to have extra oxygen administered for a short period. 8. You will be asked to provide medical information, including your allergies, prior to the procedure.  We must know immediately if you are taking blood thinners (like Coumadin/Warfarin)  Or if you are allergic to IV iodine contrast (dye). We must know if you could possible be pregnant.  Possible side-effects:  Bleeding from needle site  Infection (rare, may require surgery)  Nerve injury (rare)  Numbness & tingling (temporary)  Difficulty urinating (rare, temporary)  Spinal headache ( a headache worse with upright posture)  Light -headedness (temporary)  Pain at injection site (several days)  Decreased blood pressure (temporary)  Weakness in arm/leg (temporary)  Pressure sensation in back/neck (temporary)  Call if you experience:  Fever/chills associated with headache or increased back/neck pain.  Headache worsened by an upright position.  New onset weakness or numbness of an extremity below the injection site  Hives or difficulty breathing (go to the emergency room)  Inflammation or drainage at the infection site  Severe back/neck pain  Any new symptoms which are concerning to you  Please note:  Although the local anesthetic injected can often make your back or neck feel good for several hours after the injection, the pain will likely return.  It takes 3-7 days for steroids to work in the epidural space.  You may not notice any pain relief for at least that one week.  If effective, we will often do a series of three injections spaced 3-6 weeks apart to maximally decrease your pain.  After the initial series, we generally will wait several months before considering a repeat injection of the same type.  If you have any questions, please call [phone] [facility] 

## 2017-02-24 ENCOUNTER — Ambulatory Visit: Payer: Medicare HMO | Admitting: Anesthesiology

## 2017-02-26 ENCOUNTER — Ambulatory Visit: Payer: Medicaid Other | Admitting: Family

## 2017-03-10 ENCOUNTER — Ambulatory Visit: Payer: Medicaid Other | Admitting: Cardiovascular Disease

## 2017-03-16 ENCOUNTER — Emergency Department
Admission: EM | Admit: 2017-03-16 | Discharge: 2017-03-16 | Disposition: A | Discharge status: Home / Self Care | Payer: Medicare HMO | Attending: Student in an Organized Health Care Education/Training Program | Admitting: Student in an Organized Health Care Education/Training Program

## 2017-03-16 ENCOUNTER — Encounter: Payer: Self-pay | Admitting: *Deleted

## 2017-03-16 ENCOUNTER — Emergency Department: Payer: Medicare HMO

## 2017-03-16 ENCOUNTER — Telehealth: Payer: Self-pay

## 2017-03-16 DIAGNOSIS — Z7902 Long term (current) use of antithrombotics/antiplatelets: Secondary | ICD-10-CM | POA: Insufficient documentation

## 2017-03-16 DIAGNOSIS — Z794 Long term (current) use of insulin: Secondary | ICD-10-CM | POA: Insufficient documentation

## 2017-03-16 DIAGNOSIS — E119 Type 2 diabetes mellitus without complications: Secondary | ICD-10-CM | POA: Diagnosis not present

## 2017-03-16 DIAGNOSIS — I2510 Atherosclerotic heart disease of native coronary artery without angina pectoris: Secondary | ICD-10-CM | POA: Insufficient documentation

## 2017-03-16 DIAGNOSIS — J449 Chronic obstructive pulmonary disease, unspecified: Secondary | ICD-10-CM | POA: Insufficient documentation

## 2017-03-16 DIAGNOSIS — R0602 Shortness of breath: Secondary | ICD-10-CM | POA: Diagnosis not present

## 2017-03-16 DIAGNOSIS — Z87891 Personal history of nicotine dependence: Secondary | ICD-10-CM | POA: Insufficient documentation

## 2017-03-16 DIAGNOSIS — G8929 Other chronic pain: Secondary | ICD-10-CM | POA: Diagnosis not present

## 2017-03-16 DIAGNOSIS — R079 Chest pain, unspecified: Secondary | ICD-10-CM | POA: Insufficient documentation

## 2017-03-16 DIAGNOSIS — Z79899 Other long term (current) drug therapy: Secondary | ICD-10-CM | POA: Insufficient documentation

## 2017-03-16 DIAGNOSIS — I110 Hypertensive heart disease with heart failure: Secondary | ICD-10-CM | POA: Insufficient documentation

## 2017-03-16 DIAGNOSIS — I5032 Chronic diastolic (congestive) heart failure: Secondary | ICD-10-CM | POA: Insufficient documentation

## 2017-03-16 LAB — BASIC METABOLIC PANEL
Anion gap: 10 (ref 5–15)
BUN: 21 mg/dL — ABNORMAL HIGH (ref 6–20)
CHLORIDE: 106 mmol/L (ref 101–111)
CO2: 26 mmol/L (ref 22–32)
Calcium: 9.1 mg/dL (ref 8.9–10.3)
Creatinine, Ser: 0.81 mg/dL (ref 0.44–1.00)
GFR calc Af Amer: >60 mL/min (ref >60)
GLUCOSE: 127 mg/dL — AB (ref 65–99)
POTASSIUM: 3.6 mmol/L (ref 3.5–5.1)
SODIUM: 142 mmol/L (ref 135–145)

## 2017-03-16 LAB — CBC WITH DIFFERENTIAL/PLATELET
Basophils Absolute: 0.1 10*3/uL (ref 0–0.1)
Basophils Relative: 1 %
EOS ABS: 0.2 10*3/uL (ref 0–0.7)
Eosinophils Relative: 3 %
HEMATOCRIT: 32.5 % — AB (ref 35.0–47.0)
HEMOGLOBIN: 10.4 g/dL — AB (ref 12.0–16.0)
LYMPHS ABS: 1.8 10*3/uL (ref 1.0–3.6)
LYMPHS PCT: 29 %
MCH: 26.9 pg (ref 26.0–34.0)
MCHC: 32 g/dL (ref 32.0–36.0)
MCV: 84.2 fL (ref 80.0–100.0)
MONOS PCT: 8 %
Monocytes Absolute: 0.5 10*3/uL (ref 0.2–0.9)
NEUTROS PCT: 59 %
Neutro Abs: 3.5 10*3/uL (ref 1.4–6.5)
Platelets: 242 10*3/uL (ref 150–440)
RBC: 3.86 MIL/uL (ref 3.80–5.20)
RDW: 15.8 % — ABNORMAL HIGH (ref 11.5–14.5)
WBC: 6 10*3/uL (ref 3.6–11.0)

## 2017-03-16 LAB — BRAIN NATRIURETIC PEPTIDE: B Natriuretic Peptide: 166 pg/mL — ABNORMAL HIGH (ref 0.0–100.0)

## 2017-03-16 LAB — TROPONIN I

## 2017-03-16 MED ORDER — MAGNESIUM SULFATE 2 GM/50ML IV SOLN
2.0000 g | Freq: Once | INTRAVENOUS | Status: AC
  Administered 2017-03-16: 2 g via INTRAVENOUS
  Filled 2017-03-16: qty 50

## 2017-03-16 MED ORDER — IPRATROPIUM-ALBUTEROL 0.5-2.5 (3) MG/3ML IN SOLN
3.0000 mL | Freq: Once | RESPIRATORY_TRACT | Status: AC
  Administered 2017-03-16: 3 mL via RESPIRATORY_TRACT
  Filled 2017-03-16: qty 3

## 2017-03-16 MED ORDER — FUROSEMIDE 10 MG/ML IJ SOLN
60.0000 mg | Freq: Once | INTRAMUSCULAR | Status: AC
  Administered 2017-03-16: 60 mg via INTRAVENOUS
  Filled 2017-03-16: qty 8

## 2017-03-16 NOTE — Telephone Encounter (Signed)
Attempted to contact patient to schedule follow up on clinic. No answer, message left will attempt to reach patient again tomorrow if no return call is received.

## 2017-03-16 NOTE — ED Provider Notes (Signed)
[facility] Emergency Department Provider Note    None    (approximate)  I have reviewed the triage vital signs and the nursing notes.   HISTORY  Chief Complaint Chest Pain    HPI Tina Hayes is a 71 y.o. female chief complaint of shortness of breath and chest pain for 2 weeks.  Patient was seen by her primary care physician this morning and due to her active chest pain she was sent to the ER for further evaluation.  Patient does have history of cardiac disease status post 2 stents as well as COPD.  Not currently on any prednisone or antibiotics.  Nonproductive cough.  Denies any measured increased and her weight.  Does have some mild orthopnea.  States that chest pain is on the left anterior chest wall after she fell roughly 1 week ago.  Did not seek medical attention for this.  Has not made any changes to her medications.  Past Medical History:  Diagnosis Date  . (HFpEF) heart failure with preserved ejection fraction ([place])    a. 05/2016 Echo: EF 60-65%, mild to mod LVH, Gr1 DD, mild MR, mildly dil LA, mod TR, mildly to mod increased PASP.  [name] Anxiety   . Chronic back pain   . COPD (chronic obstructive pulmonary disease) ([place])   . Coronary artery disease    a. s/p remote PCI x 5;  b. 2006 s/p CABG x 3 ([place], [place]); b. 05/2016 MV: attenuation corrected images w/o ischemia or wma-->Med rx.  . Depression   . Diabetes mellitus without complication ([place])   . Heart attack ([place])    Total of 3 per pt.  . Hypertension   . Seizures ([place])    Family History  Problem Relation Age of Onset  . Diabetes Unknown   . Hypertension Unknown   . Diabetes Mother   . Heart failure Mother   . Heart disease Mother   . Heart attack Mother   . Stroke Mother   . Depression Mother   . Hypertension Mother   . Cancer Sister        brain  . Hypertension Sister   . Diabetes Brother   . Hypertension Brother   . Heart failure Sister   . Heart  attack Sister   . SIDS Sister    Past Surgical History:  Procedure Laterality Date  . ANKLE SURGERY    . CORONARY ARTERY BYPASS GRAFT    . KNEE SURGERY     Patient Active Problem List   Diagnosis Date Noted  . Chronic diastolic congestive heart failure ([place]) 02/13/2017  . Acute diastolic heart failure ([place]) 01/27/2017  . Depression 12/11/2016  . Essential hypertension 06/30/2016  . Snoring 06/30/2016  . Diabetes mellitus ([place]) 06/30/2016  . CHF (congestive heart failure) ([place]) 06/18/2016  . Coronary artery disease of native artery of native heart with stable angina pectoris ([place]) 06/17/2016  . Morbid obesity ([place]) 06/17/2016  . Chest pain 06/16/2016  . COPD (chronic obstructive pulmonary disease) ([place]) 08/28/2015  . Hypertensive urgency 06/03/2015      Prior to Admission medications   Medication Sig Start Date End Date Taking? Authorizing Provider  ACCU-CHEK AVIVA PLUS test strip  05/19/16   [provider]  albuterol (PROVENTIL HFA;VENTOLIN HFA) 108 (90 Base) MCG/ACT inhaler Inhale 2 puffs into the lungs every 6 (six) hours as needed for wheezing or shortness of breath. 08/30/15   Epifanio Lesches, MD  atorvastatin (LIPITOR) 40 MG tablet  Take 40 mg by mouth daily.    [provider]  Blood Glucose Monitoring Suppl (ACCU-CHEK AVIVA PLUS) w/Device KIT  06/13/16   [provider]  carvedilol (COREG) 3.125 MG tablet Take 1 tablet (3.125 mg total) by mouth 2 (two) times daily with a meal. 08/12/16   Alisa Graff, FNP  cetirizine (ZYRTEC) 10 MG tablet Take 10 mg by mouth daily.    [provider]  citalopram (CELEXA) 20 MG tablet Take 20 mg by mouth daily.    [provider]  clopidogrel (PLAVIX) 75 MG tablet Take 1 tablet (75 mg total) by mouth daily. 08/14/16   Minna Merritts, MD  furosemide (LASIX) 40 MG tablet Take 40 mg by mouth daily.    [provider]  hydrALAZINE (APRESOLINE) 25 MG tablet Take 25 mg by mouth 3 (three)  times daily.    [provider]  insulin aspart (NOVOLOG) 100 UNIT/ML injection Inject 5-10 Units into the skin 3 (three) times daily with meals. 10 units in the morning 5 units at lunch and 5 at night    [provider]  insulin glargine (LANTUS) 100 UNIT/ML injection Inject into the skin 2 (two) times daily. 30 units AM and 40 units PM    [provider]  ipratropium-albuterol (DUONEB) 0.5-2.5 (3) MG/3ML SOLN Take 3 mLs by nebulization every 6 (six) hours as needed (COPD).    [provider]  isosorbide mononitrate (IMDUR) 30 MG 24 hr tablet Take 1 tablet (30 mg total) by mouth 2 (two) times daily. 08/12/16   Alisa Graff, FNP  levETIRAcetam (KEPPRA) 750 MG tablet Take 1 tablet (750 mg total) by mouth 2 (two) times daily. 06/05/15   Rai, Vernelle Emerald, MD  losartan (COZAAR) 100 MG tablet Take 1 tablet (100 mg total) by mouth daily. 08/12/16   Alisa Graff, FNP  nitroGLYCERIN (NITROSTAT) 0.4 MG SL tablet Place 1 tablet (0.4 mg total) under the tongue every 5 (five) minutes as needed for chest pain. 07/01/16 07/01/17  Minna Merritts, MD  omeprazole (PRILOSEC) 40 MG capsule Take 40 mg by mouth daily.    [provider]  potassium chloride SA (K-DUR,KLOR-CON) 20 MEQ tablet Take 20 mEq by mouth 2 (two) times daily.    [provider]  tiotropium (SPIRIVA) 18 MCG inhalation capsule Place 18 mcg into inhaler and inhale daily.    [provider]  traMADol (ULTRAM) 50 MG tablet Take 2 tablets (100 mg total) by mouth every 6 (six) hours as needed for moderate pain. 01/22/17   Molli Barrows, MD  traZODone (DESYREL) 50 MG tablet Take 50 mg by mouth at bedtime.    [provider]  UNIFINE PENTIPS 31G X 6 MM [place]  07/23/16   [provider]    Allergies Aspirin    Social History Social History   Tobacco Use  . Smoking status: Former Smoker    Packs/day: 0.25    Years: 20.00    Pack years: 5.00    Types: Cigarettes    Last  attempt to quit: 07/01/2006    Years since quitting: 10.7  . Smokeless tobacco: Never Used  Substance Use Topics  . Alcohol use: No    Alcohol/week: 0.0 oz  . Drug use: No    Review of Systems Patient denies headaches, rhinorrhea, blurry vision, numbness, shortness of breath, chest pain, edema, cough, abdominal pain, nausea, vomiting, diarrhea, dysuria, fevers, rashes or hallucinations unless otherwise stated above in HPI.  ____________________________________________   PHYSICAL EXAM:  VITAL SIGNS: Vitals:   03/16/17 1006  BP: (!) 175/90  Pulse: (!) 54  Resp: 18  Temp: 98.1 F (36.7 C)  SpO2: 100%    Constitutional: Alert and oriented. Well appearing and in no acute distress. Eyes: Conjunctivae are normal.  Head: Atraumatic. Nose: No congestion/rhinnorhea. Mouth/Throat: Mucous membranes are moist.   Neck: No stridor. Painless ROM.  Cardiovascular: Normal rate, regular rhythm. Grossly normal heart sounds.  Good peripheral circulation. Respiratory: Normal respiratory effort.  No retractions. Lungs with occasional inspiratory crackles in posterior bases Gastrointestinal: Soft and nontender. No distention. No abdominal bruits. No CVA tenderness. Genitourinary:  Musculoskeletal: No lower extremity tenderness nor edema.  No joint effusions. Neurologic:  Normal speech and language. No gross focal neurologic deficits are appreciated. No facial droop Skin:  Skin is warm, dry and intact. No rash noted. Psychiatric: Mood and affect are normal. Speech and behavior are normal.  ____________________________________________   LABS (all labs ordered are listed, but only abnormal results are displayed)  Results for orders placed or performed during the hospital encounter of 03/16/17 (from the past 24 hour(s))  CBC with Differential/Platelet     Status: Abnormal   Collection Time: 03/16/17 10:09 AM  Result Value Ref Range   WBC 6.0 3.6 - 11.0 K/uL   RBC 3.86 3.80 - 5.20 MIL/uL    Hemoglobin 10.4 (L) 12.0 - 16.0 g/dL   HCT 32.5 (L) 35.0 - 47.0 %   MCV 84.2 80.0 - 100.0 fL   MCH 26.9 26.0 - 34.0 pg   MCHC 32.0 32.0 - 36.0 g/dL   RDW 15.8 (H) 11.5 - 14.5 %   Platelets 242 150 - 440 K/uL   Neutrophils Relative % 59 %   Neutro Abs 3.5 1.4 - 6.5 K/uL   Lymphocytes Relative 29 %   Lymphs Abs 1.8 1.0 - 3.6 K/uL   Monocytes Relative 8 %   Monocytes Absolute 0.5 0.2 - 0.9 K/uL   Eosinophils Relative 3 %   Eosinophils Absolute 0.2 0 - 0.7 K/uL   Basophils Relative 1 %   Basophils Absolute 0.1 0 - 0.1 K/uL  Basic metabolic panel     Status: Abnormal   Collection Time: 03/16/17 10:09 AM  Result Value Ref Range   Sodium 142 135 - 145 mmol/L   Potassium 3.6 3.5 - 5.1 mmol/L   Chloride 106 101 - 111 mmol/L   CO2 26 22 - 32 mmol/L   Glucose, Bld 127 (H) 65 - 99 mg/dL   BUN 21 (H) 6 - 20 mg/dL   Creatinine, Ser 0.81 0.44 - 1.00 mg/dL   Calcium 9.1 8.9 - 10.3 mg/dL   GFR calc non Af Amer >60 >60 mL/min   GFR calc Af Amer >60 >60 mL/min   Anion gap 10 5 - 15  Troponin I     Status: None   Collection Time: 03/16/17 10:09 AM  Result Value Ref Range   Troponin I <0.03 <0.03 ng/mL   ____________________________________________  EKG My review and personal interpretation at Time: 10:08   Indication: chest pain  Rate: 55  Rhythm: sinus Axis: normal Other: borderline prolonged qt, no stemi, no significant changes as compared to previous EKG ____________________________________________  RADIOLOGY  I personally reviewed all radiographic images ordered to evaluate for the above acute complaints and reviewed radiology reports and findings.  These findings were personally discussed with the patient.  Please see medical record for radiology report.  ____________________________________________   PROCEDURES  Procedure(s) performed:  Procedures    Critical Care performed: no ____________________________________________   INITIAL IMPRESSION / ASSESSMENT AND PLAN / ED  COURSE  Pertinent labs & imaging results that were available during my care of the patient were reviewed by me and considered in my medical decision making (see chart for details).  DDX: Asthma, copd, CHF, pna, ptx, malignancy, Pe, anemia   Tina Hayes is a 71 y.o. who presents to the ED with symptoms as described above.  EKG shows noticed significant changes as compared to previous.  Description of symptoms seems far less consistent with ACS given her muscular skeletal pain with description of persistent pain a for over 1 week and she has a negative troponin here.  Chest x-ray does show a mild component of vascular congestion but she has no hypoxia.  Will give dose of IV Lasix.  Will touch base with cardiology for further recommendations.  Clinical Course as of Mar 17 1355  Mon Mar 16, 2017  1132 Spoke with Dr. Fletcher Anon of cardiology who agrees to arrange for patient to be seen in heart failure clinic tomorrow morning.  Will double patient's Lasix for the next 2 days.  Patient receiving single dose of IV Lasix as well as magnesium for the prolonged long QT.  Patient otherwise stable and appropriate for follow-up tomorrow.  [PR]    Clinical Course User Index [PR] Merlyn Lot, MD     ____________________________________________   FINAL CLINICAL IMPRESSION(S) / ED DIAGNOSES  Final diagnoses:  Chest pain, unspecified type  Shortness of breath      NEW MEDICATIONS STARTED DURING THIS VISIT:  This SmartLink is deprecated. Use AVSMEDLIST instead to display the medication list for a patient.   Note:  This document was prepared using Dragon voice recognition software and may include unintentional dictation errors.    Merlyn Lot, MD 03/16/17 [phone]

## 2017-03-16 NOTE — ED Notes (Signed)
Per Pt request Nurse called Windell (pt fiancee) and informed him she was discharged and Windell states he is calling a cab to come pick up pt.  Pt is A/o walks with 4 point walker at baseline. Pt wheeled it lobby awaiting cab arrival.

## 2017-03-16 NOTE — ED Triage Notes (Signed)
Pt arrives via EMS from [facility], pt states she has been seen in office recently for fluid retention but today and chest pain, pt awake and alert upon arrival, states SOB with a dry cough

## 2017-03-16 NOTE — ED Notes (Signed)
Purewick external catheter placed per pt request

## 2017-03-16 NOTE — ED Notes (Signed)
Pt presents via ems from PCP office with chest pain x 2 weeks. Pt states she had a fall 2 weeks ago and has had some pain since then. She also reports that she has had cough & congestion x 1 week. States cough is non-productive and that it hurts worse when she coughs. Chest pain worsened when this nurse pressed on chest to auscultate with stethoscope. Pt alert & oriented with NAD noted.

## 2017-03-16 NOTE — ED Notes (Signed)
Changed external catheter because it had dislodged. Output 1200cc

## 2017-03-16 NOTE — Discharge Instructions (Signed)
Please double your lasix dose at home for the next two days.  Follow up with heart failure clinic.  Return for worsening pain, shortness of breath or for any additional concerns.

## 2017-03-16 NOTE — Telephone Encounter (Signed)
-----   Message from Wellington Hampshire, MD sent at 03/16/2017 11:18 AM EST ----- This patient was seen in the ED today with some volume overload and was given IV furosemide.  .  Please arrange for follow-up in the heart failure clinic this week.

## 2017-03-17 NOTE — Telephone Encounter (Signed)
Second attempt to contact patient in regards to making an appointment. No answer and message left on machine.

## 2017-04-01 ENCOUNTER — Telehealth: Payer: Self-pay | Admitting: Cardiovascular Disease

## 2017-04-01 NOTE — Telephone Encounter (Signed)
° °  [facility] Pre-operative Risk Assessment    Request for surgical clearance:  1. What type of surgery is being performed? Colonoscopy  2. When is this surgery scheduled? 05/04/16  3. Are there any medications that need to be held prior to surgery and how long? ASA morning of procedure Plavix x 5 days prior to procedure   4. Practice name and name of physician performing surgery? [facility] Tammi Klippel PA-C   5. What is your office phone and fax number? [facility]: [phone]  6. Anesthesia type (None, local, MAC, general) ? Unknown    PER FAX PLEASE SEND OV NOTE     _________________________________________________________________   (provider comments below)

## 2017-04-01 NOTE — Telephone Encounter (Signed)
Patient was last seen 02/17/17 and then had ED visit for chest pain on 03/16/17. Previous stress test 05/2016. Left voicemail message on patients phone to call back regarding clearance.

## 2017-04-02 NOTE — Telephone Encounter (Signed)
Pt retuning our call  ° °Please call back  °

## 2017-04-02 NOTE — Telephone Encounter (Signed)
Called and s/w patient.  Patient is currently being seen at [facility], Dr Tiffany Kocher, for dysphagia. It appears patient's chest pain occurs after eating or with eating. Patient did have some EKG changes at ED. See below.  Ed physician summary from 03/16/17: "Tina Hayes is a 71 y.o. who presents to the ED with symptoms as described above.  EKG shows noticed significant changes as compared to previous.  Description of symptoms seems far less consistent with ACS given her muscular skeletal pain with description of persistent pain a for over 1 week and she has a negative troponin here.  Chest x-ray does show a mild component of vascular congestion but she has no hypoxia.  Will give dose of IV Lasix.  Will touch base with cardiology for further recommendations."  Patient was supposed to f/u in Heart Failure clinic after ED visit on 03/16/17 but never did. Patient is now scheduled to see Otila Kluver on 04/09/17.  Dr Rockey Situ, Do you want to see patient as well prior to clearance?

## 2017-04-05 NOTE — Telephone Encounter (Signed)
Acceptable risk for procedure EKG has not significantly changed,  refer to EKG August 2018 Rare PVCs No further testing needed

## 2017-04-08 NOTE — Telephone Encounter (Signed)
Clearance routed to number provided via Epic.  

## 2017-04-09 ENCOUNTER — Ambulatory Visit: Payer: Medicaid Other | Admitting: Family

## 2017-04-23 ENCOUNTER — Ambulatory Visit: Payer: Medicare HMO | Admitting: Family

## 2017-04-23 ENCOUNTER — Encounter: Payer: Self-pay | Admitting: Family

## 2017-04-23 VITALS — BP 108/54 | HR 59 | Resp 18 | Ht 71.0 in | Wt 316.4 lb

## 2017-04-23 DIAGNOSIS — M25512 Pain in left shoulder: Secondary | ICD-10-CM | POA: Diagnosis not present

## 2017-04-23 DIAGNOSIS — I5032 Chronic diastolic (congestive) heart failure: Secondary | ICD-10-CM

## 2017-04-23 DIAGNOSIS — Z7902 Long term (current) use of antithrombotics/antiplatelets: Secondary | ICD-10-CM | POA: Diagnosis not present

## 2017-04-23 DIAGNOSIS — Z794 Long term (current) use of insulin: Secondary | ICD-10-CM

## 2017-04-23 DIAGNOSIS — M5431 Sciatica, right side: Secondary | ICD-10-CM | POA: Diagnosis not present

## 2017-04-23 DIAGNOSIS — R1310 Dysphagia, unspecified: Secondary | ICD-10-CM

## 2017-04-23 DIAGNOSIS — M5432 Sciatica, left side: Secondary | ICD-10-CM | POA: Diagnosis not present

## 2017-04-23 DIAGNOSIS — Z79899 Other long term (current) drug therapy: Secondary | ICD-10-CM | POA: Diagnosis not present

## 2017-04-23 DIAGNOSIS — M47817 Spondylosis without myelopathy or radiculopathy, lumbosacral region: Secondary | ICD-10-CM | POA: Diagnosis not present

## 2017-04-23 DIAGNOSIS — I10 Essential (primary) hypertension: Secondary | ICD-10-CM

## 2017-04-23 DIAGNOSIS — E108 Type 1 diabetes mellitus with unspecified complications: Secondary | ICD-10-CM | POA: Diagnosis not present

## 2017-04-23 DIAGNOSIS — M546 Pain in thoracic spine: Secondary | ICD-10-CM | POA: Diagnosis present

## 2017-04-23 DIAGNOSIS — E119 Type 2 diabetes mellitus without complications: Secondary | ICD-10-CM

## 2017-04-23 DIAGNOSIS — M170 Bilateral primary osteoarthritis of knee: Secondary | ICD-10-CM | POA: Diagnosis not present

## 2017-04-23 DIAGNOSIS — M5136 Other intervertebral disc degeneration, lumbar region: Secondary | ICD-10-CM | POA: Diagnosis not present

## 2017-04-23 NOTE — Patient Instructions (Signed)
Continue weighing daily and call for an overnight weight gain of > 2 pounds or a weekly weight gain of >5 pounds. 

## 2017-04-23 NOTE — Progress Notes (Signed)
Patient ID: Tina Hayes, female    DOB: [birth year], 71 y.o.   MRN: [id number]  HPI  Tina Hayes is a 71 y/o female with a history of seizures, HTN, cardiac stents, DM, depression, CAD, COPD, chronic back pain, anxiety, obesity, remote tobacco use and chronic heart failure.   Last echo was done 06/17/16 and showed an EF of 60-65% along with mild MR and moderate TR. Pulmonary systolic pressure was mildly to moderately elevated. Stress test done 06/18/16.  Was in the ED 03/16/17 due to chest pain where she was treated and released. Admitted 01/29/17 due to atypical chest pain. Cardiology consult was obtained. She was discharged the next day. Was in the ED 11/13/16 due to left upper arm pain. Treated and released.    She presents today for her follow-up visit with a chief complaint of minimal shortness of breath upon moderate exertion. She has associated fatigue, headache, chronic back pain, depression and difficulty sleeping. She denies any chest pain, edema, palpitations, dizziness or weight gain. She does notice a knot on the right lower leg at the bend of her foot.   Past Medical History:  Diagnosis Date  . (HFpEF) heart failure with preserved ejection fraction (Tina Hayes)    a. 05/2016 Echo: EF 60-65%, mild to mod LVH, Gr1 DD, mild MR, mildly dil LA, mod TR, mildly to mod increased PASP.  Tina Hayes Anxiety   . Chronic back pain   . COPD (chronic obstructive pulmonary disease) (Tina Hayes)   . Coronary artery disease    a. s/p remote PCI x 5;  b. 2006 s/p CABG x 3 (Tina Hayes, Tina Hayes); b. 05/2016 MV: attenuation corrected images w/o ischemia or wma-->Med rx.  . Depression   . Diabetes mellitus without complication (Tina Hayes)   . Heart attack (Tina Hayes)    Total of 3 per pt.  . Hypertension   . Seizures (Tina Hayes)    Past Surgical History:  Procedure Laterality Date  . ANKLE SURGERY    . CORONARY ARTERY BYPASS GRAFT    . KNEE SURGERY     Family History  Problem Relation Age of Onset  . Diabetes Unknown    . Hypertension Unknown   . Diabetes Mother   . Heart failure Mother   . Heart disease Mother   . Heart attack Mother   . Stroke Mother   . Depression Mother   . Hypertension Mother   . Cancer Sister        brain  . Hypertension Sister   . Diabetes Brother   . Hypertension Brother   . Heart failure Sister   . Heart attack Sister   . SIDS Sister    Social History   Tobacco Use  . Smoking status: Former Smoker    Packs/day: 0.25    Years: 20.00    Pack years: 5.00    Types: Cigarettes    Last attempt to quit: 07/01/2006    Years since quitting: 10.8  . Smokeless tobacco: Never Used  Substance Use Topics  . Alcohol use: No    Alcohol/week: 0.0 oz   Allergies  Allergen Reactions  . Aspirin Anaphylaxis   Prior to Admission medications   Medication Sig Start Date End Date Taking? Authorizing Provider  ACCU-CHEK AVIVA PLUS test strip  05/19/16  Yes [provider]  albuterol (PROVENTIL HFA;VENTOLIN HFA) 108 (90 Base) MCG/ACT inhaler Inhale 2 puffs into the lungs every 6 (six) hours as needed for wheezing or shortness of breath. 08/30/15  Yes  Epifanio Lesches, MD  albuterol (PROVENTIL) (2.5 MG/3ML) 0.083% nebulizer solution Take 2.5 mg every 4 (four) hours as needed by nebulization for wheezing or shortness of breath.   Yes [provider]  atorvastatin (LIPITOR) 40 MG tablet Take 40 mg at bedtime by mouth.    Yes [provider]  Blood Glucose Monitoring Suppl (ACCU-CHEK AVIVA PLUS) w/Device KIT  06/13/16  Yes [provider]  Calcium Carbonate-Vitamin D3 (CALCIUM 600-D) 600-400 MG-UNIT TABS Take 1 tablet 2 (two) times daily by mouth.   Yes [provider]  carvedilol (COREG) 3.125 MG tablet Take 1 tablet (3.125 mg total) by mouth 2 (two) times daily with a meal. 08/12/16  Yes Hackney, Tina A, FNP  cetirizine (ZYRTEC) 10 MG tablet Take 10 mg by mouth daily.   Yes [provider]  citalopram (CELEXA) 20 MG tablet Take 20 mg by  mouth daily.   Yes [provider]  clopidogrel (PLAVIX) 75 MG tablet Take 1 tablet (75 mg total) by mouth daily. 08/14/16  Yes Gollan, Kathlene November, MD  cyclobenzaprine (FLEXERIL) 5 MG tablet Take 5 mg 3 (three) times daily as needed by mouth for muscle spasms.   Yes [provider]  dextromethorphan (DELSYM) 30 MG/5ML liquid Take 15 mg every 12 (twelve) hours as needed by mouth for cough.   Yes [provider]  esomeprazole (NEXIUM) 40 MG capsule Take 40 mg daily at 12 noon by mouth.   Yes [provider]  ferrous sulfate 325 (65 FE) MG tablet Take 325 mg 3 (three) times daily with meals by mouth.   Yes [provider]  furosemide (LASIX) 20 MG tablet Take 20-40 mg daily by mouth.    Yes [provider]  insulin aspart (NOVOLOG) 100 UNIT/ML injection Inject 5-10 Units 2 (two) times daily with a meal into the skin. Take 10 units in the morning and 5 units in the evening with meal   Yes [provider]  insulin glargine (LANTUS) 100 UNIT/ML injection Inject 30-40 Units 2 (two) times daily into the skin. Inject 30 units in the morning at 40 units at bedtime.   Yes [provider]  isosorbide mononitrate (IMDUR) 30 MG 24 hr tablet Take 1 tablet (30 mg total) by mouth 2 (two) times daily. 08/12/16  Yes Tina Hayes A, FNP  levETIRAcetam (KEPPRA) 750 MG tablet Take 1 tablet (750 mg total) by mouth 2 (two) times daily. 06/05/15  Yes Rai, Ripudeep K, MD  losartan (COZAAR) 100 MG tablet Take 1 tablet (100 mg total) by mouth daily. 08/12/16  Yes Hackney, Tina A, FNP  meloxicam (MOBIC) 7.5 MG tablet Take 7.5 mg daily by mouth.   Yes [provider]  nitroGLYCERIN (NITROSTAT) 0.4 MG SL tablet Place 1 tablet (0.4 mg total) under the tongue every 5 (five) minutes as needed for chest pain. 07/01/16 07/01/17 Yes Gollan, Kathlene November, MD  potassium chloride SA (Hayes-DUR,KLOR-CON) 20 MEQ tablet Take 20 mEq daily by mouth.    Yes [provider]   tiotropium (SPIRIVA) 18 MCG inhalation capsule Place 18 mcg into inhaler and inhale daily.   Yes [provider]  traMADol (ULTRAM) 50 MG tablet Take 2 tablets (100 mg total) by mouth every 6 (six) hours as needed for moderate pain. 01/22/17  Yes Molli Barrows, MD  traZODone (DESYREL) 50 MG tablet Take 50 mg by mouth at bedtime.   Yes [provider]  UNIFINE PENTIPS 31G X 6 MM MISC  07/23/16  Yes [provider]    Review of Systems  Constitutional: Positive for fatigue. Negative for appetite change.  HENT: Negative for congestion, postnasal drip, sore throat and trouble swallowing.   Eyes: Negative.   Respiratory: Positive for shortness of breath. Negative for cough and chest tightness.   Cardiovascular: Negative for chest pain, palpitations and leg swelling.  Gastrointestinal: Negative for abdominal distention, abdominal pain and nausea.  Endocrine: Negative.   Genitourinary: Negative.   Musculoskeletal: Positive for arthralgias (left shoulder pain) and back pain (chronic). Negative for neck pain.  Skin: Negative.   Allergic/Immunologic: Negative.   Neurological: Positive for headaches (last couple of days). Negative for dizziness, weakness and light-headedness.  Hematological: Negative for adenopathy. Does not bruise/bleed easily.  Psychiatric/Behavioral: Positive for dysphoric mood and sleep disturbance (sleeping interrupted chronically). Negative for suicidal ideas. The patient is not nervous/anxious.    Vitals:   04/23/17 0937  BP: (!) 108/54  Pulse: (!) 59  Resp: 18  SpO2: 100%  Weight: (!) 316 lb 6 oz (143.5 kg)  Height: '5\' 11"'  (1.803 m)   Wt Readings from Last 3 Encounters:  04/23/17 (!) 316 lb 6 oz (143.5 kg)  03/16/17 (!) 319 lb (144.7 kg)  02/23/17 (!) 317 lb (143.8 kg)    Lab Results  Component Value Date   CREATININE 0.81 03/16/2017   CREATININE 1.29 (H) 02/13/2017   CREATININE 1.72 (H) 02/02/2017   Physical Exam  Constitutional:  She is oriented to person, place, and time. She appears well-developed and well-nourished.  HENT:  Head: Normocephalic and atraumatic.  Neck: Normal range of motion. Neck supple. No JVD present.  Cardiovascular: Regular rhythm. Bradycardia present.  Pulmonary/Chest: Effort normal. She has no wheezes. She has no rales.  Abdominal: Soft. She exhibits no distension. There is no tenderness.  Musculoskeletal: She exhibits edema (top of right foot is localized knot). She exhibits no tenderness.  Neurological: She is alert and oriented to person, place, and time.  Skin: Skin is warm and dry.  Psychiatric: She has a normal mood and affect. Her behavior is normal.  Nursing note and vitals reviewed.  Assessment & Plan:  1: Chronic heart failure with preserved ejection fraction- - NYHA class II - euvolemic today - weighing daily and says that her home weight has been stable.  Reminded to call for an overnight weight gain of >2 pounds or a weekly weight gain of >5 pounds.  - weight up 6 pounds by our scale since the last time she was here   - has been trying to keep her fluid intake to 40-60 ounces of fluid daily.   - saw her cardiologist Rockey Situ) 02/17/17 - feeling better so is now walking twice daily for 30 minutes each time - patient reports receiving her flu vaccine for the season already - to try putting some heat on the knot on her foot; says that she had previously broken this foot  2: HTN- - BP remains on the low side - BMP from 03/16/17 reviewed; sodium 142, potassium 3.6 and GFR >60 - no longer taking amlodipine or hydralazine - saw PCP Ancil Boozer) 02/05/17   3: Diabetes-  - glucose at home today was 121 fasting  4: Dysphagia- - doesn't feel like this is as bad since resuming her omeprazole - drinking 2 ensure daily  - saw GI (Johonson) 04/01/17 - has EGD scheduled for 05/04/17  Patient did not bring her medications nor a list. Each medication was verbally reviewed with the patient  and she  was encouraged to bring the bottles to every visit to confirm accuracy of list.  Return in 4 months or sooner for any questions/problems before then.

## 2017-04-24 ENCOUNTER — Other Ambulatory Visit: Payer: Self-pay

## 2017-04-24 ENCOUNTER — Other Ambulatory Visit: Payer: Self-pay | Admitting: Anesthesiology

## 2017-04-24 ENCOUNTER — Encounter: Payer: Self-pay | Admitting: Anesthesiology

## 2017-04-24 ENCOUNTER — Ambulatory Visit (HOSPITAL_BASED_OUTPATIENT_CLINIC_OR_DEPARTMENT_OTHER): Payer: Medicare HMO | Admitting: Anesthesiology

## 2017-04-24 ENCOUNTER — Ambulatory Visit
Admission: RE | Admit: 2017-04-24 | Discharge: 2017-04-24 | Disposition: A | Discharge status: Home / Self Care | Payer: Medicare HMO | Source: Ambulatory Visit | Attending: Anesthesiology | Admitting: Anesthesiology

## 2017-04-24 VITALS — BP 139/82 | HR 64 | Temp 98.4°F | Resp 16 | Ht 71.0 in | Wt 316.0 lb

## 2017-04-24 DIAGNOSIS — M47817 Spondylosis without myelopathy or radiculopathy, lumbosacral region: Secondary | ICD-10-CM

## 2017-04-24 DIAGNOSIS — Z79899 Other long term (current) drug therapy: Secondary | ICD-10-CM | POA: Insufficient documentation

## 2017-04-24 DIAGNOSIS — M25512 Pain in left shoulder: Secondary | ICD-10-CM | POA: Insufficient documentation

## 2017-04-24 DIAGNOSIS — M170 Bilateral primary osteoarthritis of knee: Secondary | ICD-10-CM

## 2017-04-24 DIAGNOSIS — E1065 Type 1 diabetes mellitus with hyperglycemia: Secondary | ICD-10-CM

## 2017-04-24 DIAGNOSIS — M4697 Unspecified inflammatory spondylopathy, lumbosacral region: Secondary | ICD-10-CM

## 2017-04-24 DIAGNOSIS — M5431 Sciatica, right side: Secondary | ICD-10-CM

## 2017-04-24 DIAGNOSIS — M5432 Sciatica, left side: Secondary | ICD-10-CM | POA: Diagnosis not present

## 2017-04-24 DIAGNOSIS — M5136 Other intervertebral disc degeneration, lumbar region: Secondary | ICD-10-CM

## 2017-04-24 DIAGNOSIS — R52 Pain, unspecified: Secondary | ICD-10-CM

## 2017-04-24 DIAGNOSIS — Z7902 Long term (current) use of antithrombotics/antiplatelets: Secondary | ICD-10-CM | POA: Insufficient documentation

## 2017-04-24 DIAGNOSIS — E108 Type 1 diabetes mellitus with unspecified complications: Secondary | ICD-10-CM

## 2017-04-24 DIAGNOSIS — Z794 Long term (current) use of insulin: Secondary | ICD-10-CM | POA: Insufficient documentation

## 2017-04-24 DIAGNOSIS — IMO0002 Reserved for concepts with insufficient information to code with codable children: Secondary | ICD-10-CM

## 2017-04-24 MED ORDER — TRIAMCINOLONE ACETONIDE 40 MG/ML IJ SUSP
40.0000 mg | Freq: Once | INTRAMUSCULAR | Status: AC
  Administered 2017-04-24: 40 mg
  Filled 2017-04-24: qty 1

## 2017-04-24 MED ORDER — SODIUM CHLORIDE 0.9 % IJ SOLN
INTRAMUSCULAR | Status: AC
  Filled 2017-04-24: qty 10

## 2017-04-24 MED ORDER — TRAMADOL HCL 50 MG PO TABS: 100.0000 mg | ORAL_TABLET | Freq: Four times a day (QID) | ORAL | 2 refills | Status: DC

## 2017-04-24 MED ORDER — LIDOCAINE HCL (PF) 1 % IJ SOLN
5.0000 mL | Freq: Once | INTRAMUSCULAR | Status: AC
  Administered 2017-04-24: 5 mL via SUBCUTANEOUS

## 2017-04-24 MED ORDER — ROPIVACAINE HCL 2 MG/ML IJ SOLN
INTRAMUSCULAR | Status: AC
  Filled 2017-04-24: qty 10

## 2017-04-24 MED ORDER — TRIAMCINOLONE ACETONIDE 40 MG/ML IJ SUSP
INTRAMUSCULAR | Status: AC
  Filled 2017-04-24: qty 1

## 2017-04-24 MED ORDER — IOPAMIDOL (ISOVUE-M 200) INJECTION 41%
20.0000 mL | Freq: Once | INTRAMUSCULAR | Status: DC | PRN
  Administered 2017-04-24: 10 mL
  Filled 2017-04-24: qty 20

## 2017-04-24 MED ORDER — LIDOCAINE HCL (PF) 1 % IJ SOLN
INTRAMUSCULAR | Status: AC
  Filled 2017-04-24: qty 5

## 2017-04-24 NOTE — Patient Instructions (Signed)
Pain Management Discharge Instructions  General Discharge Instructions :  If you need to reach your doctor call: Monday-Friday 8:00 am - 4:00 pm at [phone] or toll free [phone].  After clinic hours [phone] to have operator reach doctor.  Bring all of your medication bottles to all your appointments in the pain clinic.  To cancel or reschedule your appointment with Pain Management please remember to call 24 hours in advance to avoid a fee.  Refer to the educational materials which you have been given on: General Risks, I had my Procedure. Discharge Instructions, Post Sedation.  Post Procedure Instructions:  The drugs you were given will stay in your system until tomorrow, so for the next 24 hours you should not drive, make any legal decisions or drink any alcoholic beverages.  You may eat anything you prefer, but it is better to start with liquids then soups and crackers, and gradually work up to solid foods.  Please notify your doctor immediately if you have any unusual bleeding, trouble breathing or pain that is not related to your normal pain.  Depending on the type of procedure that was done, some parts of your body may feel week and/or numb.  This usually clears up by tonight or the next day.  Walk with the use of an assistive device or accompanied by an adult for the 24 hours.  You may use ice on the affected area for the first 24 hours.  Put ice in a Ziploc bag and cover with a towel and place against area 15 minutes on 15 minutes off.  You may switch to heat after 24 hours.Epidural Steroid Injection An epidural steroid injection is a shot of steroid medicine and numbing medicine that is given into the space between the spinal cord and the bones in your back (epidural space). The shot helps relieve pain caused by an irritated or swollen nerve root. The amount of pain relief you get from the injection depends on what is causing the nerve to be swollen and irritated,  and how long your pain lasts. You are more likely to benefit from this injection if your pain is strong and comes on suddenly rather than if you have had pain for a long time. Tell a health care provider about:  Any allergies you have.  All medicines you are taking, including vitamins, herbs, eye drops, creams, and over-the-counter medicines.  Any problems you or family members have had with anesthetic medicines.  Any blood disorders you have.  Any surgeries you have had.  Any medical conditions you have.  Whether you are pregnant or may be pregnant. What are the risks? Generally, this is a safe procedure. However, problems may occur, including:  Headache.  Bleeding.  Infection.  Allergic reaction to medicines.  Damage to your nerves.  What happens before the procedure? Staying hydrated Follow instructions from your health care provider about hydration, which may include:  Up to 2 hours before the procedure - you may continue to drink clear liquids, such as water, clear fruit juice, black coffee, and plain tea.  Eating and drinking restrictions Follow instructions from your health care provider about eating and drinking, which may include:  8 hours before the procedure - stop eating heavy meals or foods such as meat, fried foods, or fatty foods.  6 hours before the procedure - stop eating light meals or foods, such as toast or cereal.  6 hours before the procedure - stop drinking milk or drinks that contain milk.    2 hours before the procedure - stop drinking clear liquids.  Medicine  You may be given medicines to lower anxiety.  Ask your health care provider about: ? Changing or stopping your regular medicines. This is especially important if you are taking diabetes medicines or blood thinners. ? Taking medicines such as aspirin and ibuprofen. These medicines can thin your blood. Do not take these medicines before your procedure if your health care provider  instructs you not to. General instructions  Plan to have someone take you home from the hospital or clinic. What happens during the procedure?  You may receive a medicine to help you relax (sedative).  You will be asked to lie on your abdomen.  The injection site will be cleaned.  A numbing medicine (local anesthetic) will be used to numb the injection site.  A needle will be inserted through your skin into the epidural space. You may feel some discomfort when this happens. An X-ray machine will be used to make sure the needle is put as close as possible to the affected nerve.  A steroid medicine and a local anesthetic will be injected into the epidural space.  The needle will be removed.  A bandage (dressing) will be put over the injection site. What happens after the procedure?  Your blood pressure, heart rate, breathing rate, and blood oxygen level will be monitored until the medicines you were given have worn off.  Your arm or leg may feel weak or numb for a few hours.  The injection site may feel sore.  Do not drive for 24 hours if you received a sedative. This information is not intended to replace advice given to you by your health care provider. Make sure you discuss any questions you have with your health care provider. Document Released: 07/22/2007 Document Revised: 09/26/2015 Document Reviewed: 07/31/2015 [employer] Interactive Patient Education  2018 [employer]  

## 2017-04-25 NOTE — Progress Notes (Signed)
Subjective:  Patient ID: Tina Hayes, female    DOB: 05-01-1945  Age: 71 y.o. MRN: [id number]  CC: Back Pain (middle)   Procedure: L5-S1 epidural steroid under fluoroscopic guidance without sedation  HPI [name] presents for reevaluation.  She was last seen in September and has had a previous epidural x2.  Most recently this was in the summer.  She has had some recurrence of her lower extremity radiating pain affecting the calves buttocks and posterior legs.  This is similar to what she has experienced previously.  She has been taking her medications but despite her tramadol that she takes 2 tablets 3 times a day she continues to have breakthrough pain.  She desires to proceed with a repeat epidural injection today.  Otherwise no change in lower extremity strength or function is noted at this time.  Her bowel bladder function has been stable per patient's history.  Outpatient Medications Prior to Visit  Medication Sig Dispense Refill  . ACCU-CHEK AVIVA PLUS test strip     . albuterol (PROVENTIL HFA;VENTOLIN HFA) 108 (90 Base) MCG/ACT inhaler Inhale 2 puffs into the lungs every 6 (six) hours as needed for wheezing or shortness of breath. 1 Inhaler 2  . albuterol (PROVENTIL) (2.5 MG/3ML) 0.083% nebulizer solution Take 2.5 mg every 4 (four) hours as needed by nebulization for wheezing or shortness of breath.    [name] atorvastatin (LIPITOR) 40 MG tablet Take 40 mg at bedtime by mouth.     . Blood Glucose Monitoring Suppl (ACCU-CHEK AVIVA PLUS) w/Device KIT     . Calcium Carbonate-Vitamin D3 (CALCIUM 600-D) 600-400 MG-UNIT TABS Take 1 tablet 2 (two) times daily by mouth.    . carvedilol (COREG) 3.125 MG tablet Take 1 tablet (3.125 mg total) by mouth 2 (two) times daily with a meal. 180 tablet 3  . cetirizine (ZYRTEC) 10 MG tablet Take 10 mg by mouth daily.    . citalopram (CELEXA) 20 MG tablet Take 20 mg by mouth daily.    . clopidogrel (PLAVIX) 75 MG tablet Take 1 tablet (75 mg total) by mouth  daily. 30 tablet 0  . cyclobenzaprine (FLEXERIL) 5 MG tablet Take 5 mg 3 (three) times daily as needed by mouth for muscle spasms.    [name] esomeprazole (NEXIUM) 40 MG capsule Take 40 mg daily at 12 noon by mouth.    . ferrous sulfate 325 (65 FE) MG tablet Take 325 mg 3 (three) times daily with meals by mouth.    . furosemide (LASIX) 20 MG tablet Take 20-40 mg daily by mouth.     . insulin aspart (NOVOLOG) 100 UNIT/ML injection Inject 5-10 Units 2 (two) times daily with a meal into the skin. Take 10 units in the morning and 5 units in the evening with meal    . insulin glargine (LANTUS) 100 UNIT/ML injection Inject 30-40 Units 2 (two) times daily into the skin. Inject 30 units in the morning at 40 units at bedtime.    . isosorbide mononitrate (IMDUR) 30 MG 24 hr tablet Take 1 tablet (30 mg total) by mouth 2 (two) times daily. 180 tablet 3  . losartan (COZAAR) 100 MG tablet Take 1 tablet (100 mg total) by mouth daily. 90 tablet 3  . meloxicam (MOBIC) 7.5 MG tablet Take 7.5 mg daily by mouth.    . nitroGLYCERIN (NITROSTAT) 0.4 MG SL tablet Place 1 tablet (0.4 mg total) under the tongue every 5 (five) minutes as needed for chest pain. 25 tablet 6  .  potassium chloride SA (K-DUR,KLOR-CON) 20 MEQ tablet Take 20 mEq daily by mouth.     . tiotropium (SPIRIVA) 18 MCG inhalation capsule Place 18 mcg into inhaler and inhale daily.    . traZODone (DESYREL) 50 MG tablet Take 50 mg by mouth at bedtime.    [name] UNIFINE PENTIPS 31G X 6 MM MISC     . traMADol (ULTRAM) 50 MG tablet Take 2 tablets (100 mg total) by mouth every 6 (six) hours as needed for moderate pain. 180 tablet 2  . dextromethorphan (DELSYM) 30 MG/5ML liquid Take 15 mg every 12 (twelve) hours as needed by mouth for cough.    . levETIRAcetam (KEPPRA) 750 MG tablet Take 1 tablet (750 mg total) by mouth 2 (two) times daily. (Patient not taking: Reported on 04/24/2017) 60 tablet 3   No facility-administered medications prior to visit.     Review of  Systems CNS: No confusion or sedation Cardiac: No angina or palpitations GI: No abdominal pain or constipation   Objective:  BP 139/82   Pulse 64   Temp 98.4 F (36.9 C)   Resp 16   Ht 5' 11" (1.803 m)   Wt (!) 316 lb (143.3 kg)   SpO2 100%   BMI 44.07 kg/m    BP Readings from Last 3 Encounters:  04/24/17 139/82  04/23/17 (!) 108/54  03/16/17 (!) 175/90     Wt Readings from Last 3 Encounters:  04/24/17 (!) 316 lb (143.3 kg)  04/23/17 (!) 316 lb 6 oz (143.5 kg)  03/16/17 (!) 319 lb (144.7 kg)     Physical Exam Pt is alert and oriented PERRL EOMI HEART IS RRR no murmur or rub LCTA no wheezing or rales MUSCULOSKELETAL reveals some paraspinous muscle tenderness in the lumbar region but no overt trigger points.  She does have pain with extension at the knee and right leg raise is equivocal bilaterally.  Labs  Lab Results  Component Value Date   HGBA1C 8.5 (H) 01/29/2017   HGBA1C 8.5 (H) 06/17/2016   HGBA1C 8.0 (H) 06/03/2015   Lab Results  Component Value Date   LDLCALC 58 01/30/2017   CREATININE 0.81 03/16/2017    -------------------------------------------------------------------------------------------------------------------- Lab Results  Component Value Date   WBC 6.0 03/16/2017   HGB 10.4 (L) 03/16/2017   HCT 32.5 (L) 03/16/2017   PLT 242 03/16/2017   GLUCOSE 127 (H) 03/16/2017   CHOL 120 01/30/2017   TRIG 73 01/30/2017   HDL 47 01/30/2017   LDLCALC 58 01/30/2017   ALT 15 08/14/2016   AST 18 08/14/2016   NA 142 03/16/2017   K 3.6 03/16/2017   CL 106 03/16/2017   CREATININE 0.81 03/16/2017   BUN 21 (H) 03/16/2017   CO2 26 03/16/2017   INR 0.99 11/13/2016   HGBA1C 8.5 (H) 01/29/2017    --------------------------------------------------------------------------------------------------------------------- Dg C-arm 1-60 Min-no Report  Result Date: 04/24/2017 Fluoroscopy was utilized by the requesting physician.  No radiographic  interpretation.     Assessment & Plan:   [name] was seen today for back pain.  Diagnoses and all orders for this visit:  Bilateral sciatica -     Lumbar Epidural Injection; Future  Facet arthritis of lumbosacral region [facility])  Primary osteoarthritis of both knees  DDD (degenerative disc disease), lumbar  Pain in joint of left shoulder  Type I diabetes mellitus with complication, uncontrolled ([place])  Other orders -     traMADol (ULTRAM) 50 MG tablet; Take 2 tablets (100 mg total) by mouth 4 (four)  times daily. -     triamcinolone acetonide (KENALOG-40) injection 40 mg -     lidocaine (PF) (XYLOCAINE) 1 % injection 5 mL -     iopamidol (ISOVUE-M) 41 % intrathecal injection 20 mL        ----------------------------------------------------------------------------------------------------------------------  Problem List Items Addressed This Visit    None    Visit Diagnoses    Bilateral sciatica    -  Primary   Relevant Orders   Lumbar Epidural Injection   Facet arthritis of lumbosacral region (HCC)       Relevant Medications   traMADol (ULTRAM) 50 MG tablet   triamcinolone acetonide (KENALOG-40) injection 40 mg (Completed)   Primary osteoarthritis of both knees       Relevant Medications   traMADol (ULTRAM) 50 MG tablet   triamcinolone acetonide (KENALOG-40) injection 40 mg (Completed)   DDD (degenerative disc disease), lumbar       Relevant Medications   traMADol (ULTRAM) 50 MG tablet   triamcinolone acetonide (KENALOG-40) injection 40 mg (Completed)   Pain in joint of left shoulder       Type I diabetes mellitus with complication, uncontrolled ([place])            ----------------------------------------------------------------------------------------------------------------------  1. Bilateral sciatica We will proceed with a repeat lumbar epidural today at L5-S1.  We are doing this without sedation per patient request.  We will have her continue with efforts at  weight loss which thus far have been unsuccessful and efforts at stretching strengthening which have also been unsuccessful.  She is requesting an increase in her tramadol and based on the patient's body habitus we will increase to 2 tablets up to 4 times a day with refills given today.  We have also reviewed the [facility] practitioner database information and it is appropriate. - Lumbar Epidural Injection; Future  2. Facet arthritis of lumbosacral region ([place])   3. Primary osteoarthritis of both knees   4. DDD (degenerative disc disease), lumbar As above  5. Pain in joint of left shoulder   6. Type I diabetes mellitus with complication, uncontrolled ([place])     ----------------------------------------------------------------------------------------------------------------------  I have changed Braley Bellotti's traMADol. I am also having her maintain her tiotropium, levETIRAcetam, traZODone, albuterol, cetirizine, citalopram, nitroGLYCERIN, ACCU-CHEK AVIVA PLUS, ACCU-CHEK AVIVA PLUS, UNIFINE PENTIPS, insulin aspart, losartan, carvedilol, isosorbide mononitrate, clopidogrel, atorvastatin, furosemide, insulin glargine, potassium chloride SA, esomeprazole, dextromethorphan, Calcium Carbonate-Vitamin D3, cyclobenzaprine, ferrous sulfate, albuterol, and meloxicam. We administered triamcinolone acetonide, lidocaine (PF), and iopamidol.   Meds ordered this encounter  Medications  . traMADol (ULTRAM) 50 MG tablet    Sig: Take 2 tablets (100 mg total) by mouth 4 (four) times daily.    Dispense:  240 tablet    Refill:  2  . triamcinolone acetonide (KENALOG-40) injection 40 mg  . lidocaine (PF) (XYLOCAINE) 1 % injection 5 mL  . iopamidol (ISOVUE-M) 41 % intrathecal injection 20 mL     Medication List        Accurate as of 04/24/17 11:59 PM. Always use your most recent med list.          ACCU-CHEK AVIVA PLUS test strip Generic drug:  glucose blood   ACCU-CHEK AVIVA PLUS w/Device  Kit   * albuterol (2.5 MG/3ML) 0.083% nebulizer solution Commonly known as:  PROVENTIL   * albuterol 108 (90 Base) MCG/ACT inhaler Commonly known as:  PROVENTIL HFA;VENTOLIN HFA Inhale 2 puffs into the lungs every 6 (six) hours as needed for wheezing or shortness of  breath.   atorvastatin 40 MG tablet Commonly known as:  LIPITOR   CALCIUM 600-D 600-400 MG-UNIT Tabs Generic drug:  Calcium Carbonate-Vitamin D3   carvedilol 3.125 MG tablet Commonly known as:  COREG Take 1 tablet (3.125 mg total) by mouth 2 (two) times daily with a meal.   cetirizine 10 MG tablet Commonly known as:  ZYRTEC   citalopram 20 MG tablet Commonly known as:  CELEXA   clopidogrel 75 MG tablet Commonly known as:  PLAVIX Take 1 tablet (75 mg total) by mouth daily.   cyclobenzaprine 5 MG tablet Commonly known as:  FLEXERIL   DELSYM 30 MG/5ML liquid Generic drug:  dextromethorphan   esomeprazole 40 MG capsule Commonly known as:  NEXIUM   ferrous sulfate 325 (65 FE) MG tablet   furosemide 20 MG tablet Commonly known as:  LASIX   insulin aspart 100 UNIT/ML injection Commonly known as:  novoLOG   insulin glargine 100 UNIT/ML injection Commonly known as:  LANTUS   isosorbide mononitrate 30 MG 24 hr tablet Commonly known as:  IMDUR Take 1 tablet (30 mg total) by mouth 2 (two) times daily.   levETIRAcetam 750 MG tablet Commonly known as:  KEPPRA Take 1 tablet (750 mg total) by mouth 2 (two) times daily.   losartan 100 MG tablet Commonly known as:  COZAAR Take 1 tablet (100 mg total) by mouth daily.   meloxicam 7.5 MG tablet Commonly known as:  MOBIC   nitroGLYCERIN 0.4 MG SL tablet Commonly known as:  NITROSTAT Place 1 tablet (0.4 mg total) under the tongue every 5 (five) minutes as needed for chest pain.   potassium chloride SA 20 MEQ tablet Commonly known as:  K-DUR,KLOR-CON   tiotropium 18 MCG inhalation capsule Commonly known as:  SPIRIVA   traMADol 50 MG tablet Commonly known  as:  ULTRAM Take 2 tablets (100 mg total) by mouth 4 (four) times daily.   traZODone 50 MG tablet Commonly known as:  DESYREL   UNIFINE PENTIPS 31G X 6 MM Misc Generic drug:  Insulin Pen Needle      * This list has 2 medication(s) that are the same as other medications prescribed for you. Read the directions carefully, and ask your doctor or other care provider to review them with you.          Where to Get Your Medications    You can get these medications from any pharmacy   Bring a paper prescription for each of these medications  traMADol 50 MG tablet    ----------------------------------------------------------------------------------------------------------------------  Follow-up: Return for evaluation, procedure.   Procedure: L5-S1 epidural steroid No. 3 under fluoroscopic guidance with no sedation   Procedure: 5 S1 LESI with fluoroscopic guidance and moderate sedation  NOTE: The risks, benefits, and expectations of the procedure have been discussed and explained to the patient who was understanding and in agreement with suggested treatment plan. No guarantees were made.  DESCRIPTION OF PROCEDURE: Lumbar epidural steroid injection with no IV Versed, EKG, blood pressure, pulse, and pulse oximetry monitoring. The procedure was performed with the patient in the prone position under fluoroscopic guidance.  Sterile prep x3 was initiated and I then injected subcutaneous lidocaine to the overlying the S1 site after its fluoroscopic identifictation.  Using strict aseptic technique, I then advanced an 18-gauge 5 inch Tuohy epidural needle in the midline using interlaminar approach via loss-of-resistance to saline technique. There was negative aspiration for heme or  CSF.  I then confirmed position with both AP  and Lateral fluoroscan.  2 cc of Isovue were injected and a  total of 5 mL of Preservative-Free normal saline mixed with 40 mg of Kenalog and 0 cc Ropicaine 0.2 percent were  injected incrementally via the  epidurally placed needle. The needle was removed. The patient tolerated the injection well and was convalesced and discharged to home in stable condition. Should the patient have any post procedure difficulty they have been instructed on how to contact us for assistance.   Molli Barrows, MD

## 2017-04-29 ENCOUNTER — Telehealth: Payer: Self-pay | Admitting: Anesthesiology

## 2017-04-29 NOTE — Telephone Encounter (Signed)
Talked with Pt. She states that she didn't go to the ER and that she is feeling better today. States BS in 200's and has a cold. Pt taking Robitussin for cold. Instructed pt that the combination of steroid in procedure plus the cold med could elevate BS. Instructed that she needs to take med for diabetics. Alo instructed to go to ER if needed.

## 2017-04-29 NOTE — Telephone Encounter (Signed)
Patient lvm on Sat 12-29 at 10:14 stating she was having complications, high heart rate, high sugar 500+ and vomiting. Please call

## 2017-05-01 ENCOUNTER — Encounter: Payer: Self-pay | Admitting: *Deleted

## 2017-05-04 ENCOUNTER — Ambulatory Visit
Admission: RE | Admit: 2017-05-04 | Discharge: 2017-05-04 | Disposition: A | Discharge status: Home / Self Care | Payer: Medicare HMO | Source: Ambulatory Visit | Attending: Unknown Physician Specialty | Admitting: Unknown Physician Specialty

## 2017-05-04 ENCOUNTER — Encounter
Admission: RE | Disposition: A | Discharge status: Home / Self Care | Payer: Self-pay | Source: Ambulatory Visit | Attending: Unknown Physician Specialty

## 2017-05-04 ENCOUNTER — Ambulatory Visit: Payer: Medicare HMO | Admitting: Anesthesiology

## 2017-05-04 ENCOUNTER — Encounter: Payer: Self-pay | Admitting: *Deleted

## 2017-05-04 DIAGNOSIS — I252 Old myocardial infarction: Secondary | ICD-10-CM | POA: Insufficient documentation

## 2017-05-04 DIAGNOSIS — Z794 Long term (current) use of insulin: Secondary | ICD-10-CM | POA: Diagnosis not present

## 2017-05-04 DIAGNOSIS — K449 Diaphragmatic hernia without obstruction or gangrene: Secondary | ICD-10-CM | POA: Diagnosis not present

## 2017-05-04 DIAGNOSIS — K640 First degree hemorrhoids: Secondary | ICD-10-CM | POA: Insufficient documentation

## 2017-05-04 DIAGNOSIS — I2510 Atherosclerotic heart disease of native coronary artery without angina pectoris: Secondary | ICD-10-CM | POA: Insufficient documentation

## 2017-05-04 DIAGNOSIS — Z8249 Family history of ischemic heart disease and other diseases of the circulatory system: Secondary | ICD-10-CM | POA: Insufficient documentation

## 2017-05-04 DIAGNOSIS — R569 Unspecified convulsions: Secondary | ICD-10-CM | POA: Insufficient documentation

## 2017-05-04 DIAGNOSIS — I110 Hypertensive heart disease with heart failure: Secondary | ICD-10-CM | POA: Insufficient documentation

## 2017-05-04 DIAGNOSIS — Z6841 Body Mass Index (BMI) 40.0 and over, adult: Secondary | ICD-10-CM | POA: Insufficient documentation

## 2017-05-04 DIAGNOSIS — Z951 Presence of aortocoronary bypass graft: Secondary | ICD-10-CM | POA: Insufficient documentation

## 2017-05-04 DIAGNOSIS — I5032 Chronic diastolic (congestive) heart failure: Secondary | ICD-10-CM | POA: Insufficient documentation

## 2017-05-04 DIAGNOSIS — D509 Iron deficiency anemia, unspecified: Secondary | ICD-10-CM | POA: Diagnosis present

## 2017-05-04 DIAGNOSIS — K5730 Diverticulosis of large intestine without perforation or abscess without bleeding: Secondary | ICD-10-CM | POA: Diagnosis not present

## 2017-05-04 DIAGNOSIS — Z79899 Other long term (current) drug therapy: Secondary | ICD-10-CM | POA: Diagnosis not present

## 2017-05-04 DIAGNOSIS — K222 Esophageal obstruction: Secondary | ICD-10-CM | POA: Diagnosis not present

## 2017-05-04 DIAGNOSIS — F419 Anxiety disorder, unspecified: Secondary | ICD-10-CM | POA: Insufficient documentation

## 2017-05-04 DIAGNOSIS — J449 Chronic obstructive pulmonary disease, unspecified: Secondary | ICD-10-CM | POA: Insufficient documentation

## 2017-05-04 DIAGNOSIS — E119 Type 2 diabetes mellitus without complications: Secondary | ICD-10-CM | POA: Insufficient documentation

## 2017-05-04 DIAGNOSIS — Z87891 Personal history of nicotine dependence: Secondary | ICD-10-CM | POA: Diagnosis not present

## 2017-05-04 DIAGNOSIS — Z7982 Long term (current) use of aspirin: Secondary | ICD-10-CM | POA: Diagnosis not present

## 2017-05-04 HISTORY — PX: ESOPHAGOGASTRODUODENOSCOPY (EGD) WITH PROPOFOL: SHX5813

## 2017-05-04 HISTORY — PX: COLONOSCOPY WITH PROPOFOL: SHX5780

## 2017-05-04 LAB — GLUCOSE, CAPILLARY: Glucose-Capillary: 109 mg/dL — ABNORMAL HIGH (ref 65–99)

## 2017-05-04 SURGERY — ESOPHAGOGASTRODUODENOSCOPY (EGD) WITH PROPOFOL
Anesthesia: General

## 2017-05-04 MED ORDER — PIPERACILLIN-TAZOBACTAM 3.375 G IVPB 30 MIN
3.3750 g | Freq: Once | INTRAVENOUS | Status: AC
  Administered 2017-05-04: 3.375 g via INTRAVENOUS
  Filled 2017-05-04: qty 50

## 2017-05-04 MED ORDER — FENTANYL CITRATE (PF) 100 MCG/2ML IJ SOLN
INTRAMUSCULAR | Status: DC | PRN
  Administered 2017-05-04 (×2): 25 ug via INTRAVENOUS
  Administered 2017-05-04: 50 ug via INTRAVENOUS

## 2017-05-04 MED ORDER — LIDOCAINE HCL (PF) 2 % IJ SOLN
INTRAMUSCULAR | Status: AC
  Filled 2017-05-04: qty 10

## 2017-05-04 MED ORDER — PROPOFOL 500 MG/50ML IV EMUL
INTRAVENOUS | Status: AC
  Filled 2017-05-04: qty 50

## 2017-05-04 MED ORDER — MIDAZOLAM HCL 2 MG/2ML IJ SOLN
INTRAMUSCULAR | Status: AC
  Filled 2017-05-04: qty 2

## 2017-05-04 MED ORDER — MIDAZOLAM HCL 5 MG/5ML IJ SOLN
INTRAMUSCULAR | Status: DC | PRN
  Administered 2017-05-04: 0.5 mg via INTRAVENOUS
  Administered 2017-05-04: 1 mg via INTRAVENOUS
  Administered 2017-05-04: 0.5 mg via INTRAVENOUS

## 2017-05-04 MED ORDER — FENTANYL CITRATE (PF) 100 MCG/2ML IJ SOLN
INTRAMUSCULAR | Status: AC
  Filled 2017-05-04: qty 2

## 2017-05-04 MED ORDER — GLYCOPYRROLATE 0.2 MG/ML IJ SOLN
INTRAMUSCULAR | Status: AC
  Filled 2017-05-04: qty 1

## 2017-05-04 MED ORDER — PROPOFOL 500 MG/50ML IV EMUL
INTRAVENOUS | Status: DC | PRN
  Administered 2017-05-04: 50 ug/kg/min via INTRAVENOUS

## 2017-05-04 MED ORDER — PROPOFOL 10 MG/ML IV BOLUS
INTRAVENOUS | Status: DC | PRN
  Administered 2017-05-04: 20 mg via INTRAVENOUS
  Administered 2017-05-04: 30 mg via INTRAVENOUS

## 2017-05-04 MED ORDER — SODIUM CHLORIDE 0.9 % IV SOLN: INTRAVENOUS | Status: DC

## 2017-05-04 MED ORDER — ESMOLOL HCL 100 MG/10ML IV SOLN
INTRAVENOUS | Status: AC
  Filled 2017-05-04: qty 10

## 2017-05-04 MED ORDER — ESMOLOL HCL 100 MG/10ML IV SOLN
INTRAVENOUS | Status: DC | PRN
  Administered 2017-05-04: 10 mg via INTRAVENOUS

## 2017-05-04 MED ORDER — LIDOCAINE HCL (PF) 2 % IJ SOLN
INTRAMUSCULAR | Status: DC | PRN
  Administered 2017-05-04: 100 mg

## 2017-05-04 MED ORDER — SODIUM CHLORIDE 0.9 % IV SOLN
INTRAVENOUS | Status: DC
  Administered 2017-05-04: 1000 mL via INTRAVENOUS

## 2017-05-04 MED ORDER — PIPERACILLIN-TAZOBACTAM 3.375 G IVPB
INTRAVENOUS | Status: AC
  Filled 2017-05-04: qty 50

## 2017-05-04 NOTE — Anesthesia Postprocedure Evaluation (Signed)
Anesthesia Post Note  Patient: Tina Hayes  Procedure(s) Performed: ESOPHAGOGASTRODUODENOSCOPY (EGD) WITH PROPOFOL (N/A ) COLONOSCOPY WITH PROPOFOL (N/A )  Patient location during evaluation: Endoscopy Anesthesia Type: General Level of consciousness: awake and alert Pain management: pain level controlled Vital Signs Assessment: post-procedure vital signs reviewed and stable Respiratory status: spontaneous breathing, nonlabored ventilation, respiratory function stable and patient connected to nasal cannula oxygen Cardiovascular status: blood pressure returned to baseline and stable Postop Assessment: no apparent nausea or vomiting Anesthetic complications: no     Last Vitals:  Vitals:   05/04/17 1038 05/04/17 1048  BP: (!) 163/75 (!) 178/77  Pulse: (!) 55 (!) 55  Resp: 15 13  Temp:    SpO2: 99% 100%    Last Pain:  Vitals:   05/04/17 1018  TempSrc: Tympanic  PainSc:                  Martha Clan

## 2017-05-04 NOTE — Op Note (Signed)
[facility] Gastroenterology Patient Name: Tina Hayes Procedure Date: 05/04/2017 9:24 AM MRN: [id number] Account #: [id number] Date of Birth: 31-May-1945 Admit Type: Outpatient Age: 72 Room: [facility] ENDO ROOM 3 Gender: Female Note Status: Finalized Procedure:            Colonoscopy Indications:          Unexplained iron deficiency anemia Providers:            [name], MD Referring MD:         Tania Ade (Referring MD) Medicines:            Propofol per Anesthesia Complications:        No immediate complications. Procedure:            Pre-Anesthesia Assessment:                       - After reviewing the risks and benefits, the patient                        was deemed in satisfactory condition to undergo the                        procedure.                       After obtaining informed consent, the colonoscope was                        passed under direct vision. Throughout the procedure,                        the patient's blood pressure, pulse, and oxygen                        saturations were monitored continuously. The                        Colonoscope was introduced through the anus and                        advanced to the the cecum, identified by appendiceal                        orifice and ileocecal valve. The colonoscopy was                        somewhat difficult due to the patient's body habitus.                        Successful completion of the procedure was aided by                        using manual pressure. The patient tolerated the                        procedure well. The quality of the bowel preparation                        was adequate to identify polyps 6 mm and larger in size. Findings:      There was scattered thin layer of stool  in much of the colon but able to       lavage and reach the cecum.      A few small-mouthed diverticula were found in the sigmoid colon.      Internal hemorrhoids were found during  endoscopy. The hemorrhoids were       small and Grade I (internal hemorrhoids that do not prolapse).      The exam was otherwise without abnormality. Impression:           - Diverticulosis in the sigmoid colon.                       - Internal hemorrhoids.                       - The examination was otherwise normal.                       - No specimens collected. Recommendation:       - The findings and recommendations were discussed with                        the patient's family. Take iron pils and multivitamins. [name], MD 05/04/2017 10:19:26 AM This report has been signed electronically. Number of Addenda: 0 Note Initiated On: 05/04/2017 9:24 AM Scope Withdrawal Time: 0 hours 10 minutes 59 seconds  Total Procedure Duration: 0 hours 25 minutes 35 seconds       [facility]

## 2017-05-04 NOTE — Op Note (Signed)
[facility] Gastroenterology Patient Name: Tina Hayes Procedure Date: 05/04/2017 9:24 AM MRN: [id number] Account #: [id number] Date of Birth: Nov 23, 1945 Admit Type: Outpatient Age: 72 Room: [facility] ENDO ROOM 3 Gender: Female Note Status: Finalized Procedure:            Upper GI endoscopy Indications:          Dysphagia Providers:            [name], MD Referring MD:         Tania Ade (Referring MD) Medicines:            Propofol per Anesthesia Complications:        No immediate complications. Procedure:            Pre-Anesthesia Assessment:                       - After reviewing the risks and benefits, the patient                        was deemed in satisfactory condition to undergo the                        procedure.                       After obtaining informed consent, the endoscope was                        passed under direct vision. Throughout the procedure,                        the patient's blood pressure, pulse, and oxygen                        saturations were monitored continuously. The Endoscope                        was introduced through the mouth, and advanced to the                        second part of duodenum. The upper GI endoscopy was                        accomplished without difficulty. The patient tolerated                        the procedure well. Findings:      A mild Schatzki ring (acquired) was found at the gastroesophageal       junction. A guidewire was placed and the scope was withdrawn. Dilation       was performed with a Savary dilator with mild resistance at 16 mm. GEJ       about 35cm.      A medium-sized hiatal hernia was present.      The duodenal bulb and second portion of the duodenum were normal.      Patchy minimal inflammation characterized by erythema and granularity       was found in the gastric antrum. Impression:           - Mild Schatzki ring. Dilated.                       -  Medium-sized hiatal hernia.                       - Normal duodenal bulb and second portion of the                        duodenum.                       - Gastritis.                       - No specimens collected. Recommendation:       - soft food for 3 days, eat slowly, chew well, take                        small bites                       - Perform a colonoscopy as previously scheduled. [name], MD 05/04/2017 9:47:20 AM This report has been signed electronically. Number of Addenda: 0 Note Initiated On: 05/04/2017 9:24 AM      [facility]

## 2017-05-04 NOTE — H&P (Signed)
Primary Care Physician:  Herminio Commons, MD Primary Gastroenterologist:  Dr. Vira Agar  Pre-Procedure History & Physical: HPI:  Tina Hayes is a 72 y.o. female is here for an endoscopy and colonoscopy.   Past Medical History:  Diagnosis Date  . (HFpEF) heart failure with preserved ejection fraction ([place])    a. 05/2016 Echo: EF 60-65%, mild to mod LVH, Gr1 DD, mild MR, mildly dil LA, mod TR, mildly to mod increased PASP.  [name] Anxiety   . CHF (congestive heart failure) ([place])   . Chronic back pain   . COPD (chronic obstructive pulmonary disease) ([place])   . Coronary artery disease    a. s/p remote PCI x 5;  b. 2006 s/p CABG x 3 ([place], [place]); b. 05/2016 MV: attenuation corrected images w/o ischemia or wma-->Med rx.  . Depression   . Diabetes mellitus without complication ([place])   . Heart attack ([place])    Total of 3 per pt.  . Hypertension   . Seizures ([place])     Past Surgical History:  Procedure Laterality Date  . ANKLE SURGERY    . CORONARY ANGIOPLASTY    . CORONARY ARTERY BYPASS GRAFT    . FRACTURE SURGERY    . KNEE SURGERY      Prior to Admission medications   Medication Sig Start Date End Date Taking? Authorizing Provider  ACCU-CHEK AVIVA PLUS test strip  05/19/16  Yes [provider]  albuterol (PROVENTIL HFA;VENTOLIN HFA) 108 (90 Base) MCG/ACT inhaler Inhale 2 puffs into the lungs every 6 (six) hours as needed for wheezing or shortness of breath. 08/30/15  Yes Epifanio Lesches, MD  albuterol (PROVENTIL) (2.5 MG/3ML) 0.083% nebulizer solution Take 2.5 mg every 4 (four) hours as needed by nebulization for wheezing or shortness of breath.   Yes [provider]  atorvastatin (LIPITOR) 40 MG tablet Take 40 mg at bedtime by mouth.    Yes [provider]  Blood Glucose Monitoring Suppl (ACCU-CHEK AVIVA PLUS) w/Device KIT  06/13/16  Yes [provider]  Calcium Carbonate-Vitamin D3 (CALCIUM 600-D) 600-400 MG-UNIT  TABS Take 1 tablet 2 (two) times daily by mouth.   Yes [provider]  carvedilol (COREG) 3.125 MG tablet Take 1 tablet (3.125 mg total) by mouth 2 (two) times daily with a meal. 08/12/16  Yes Hackney, Tina A, FNP  cetirizine (ZYRTEC) 10 MG tablet Take 10 mg by mouth daily.   Yes [provider]  citalopram (CELEXA) 20 MG tablet Take 20 mg by mouth daily.   Yes [provider]  clopidogrel (PLAVIX) 75 MG tablet Take 1 tablet (75 mg total) by mouth daily. 08/14/16  Yes Gollan, Kathlene November, MD  cyclobenzaprine (FLEXERIL) 5 MG tablet Take 5 mg 3 (three) times daily as needed by mouth for muscle spasms.   Yes [provider]  dextromethorphan (DELSYM) 30 MG/5ML liquid Take 15 mg every 12 (twelve) hours as needed by mouth for cough.   Yes [provider]  esomeprazole (NEXIUM) 40 MG capsule Take 40 mg daily at 12 noon by mouth.   Yes [provider]  ferrous sulfate 325 (65 FE) MG tablet Take 325 mg 3 (three) times daily with meals by mouth.   Yes [provider]  furosemide (LASIX) 20 MG tablet Take 20-40 mg daily by mouth.    Yes [provider]  insulin aspart (NOVOLOG) 100 UNIT/ML injection Inject 5-10 Units 2 (two) times daily with a meal into the  skin. Take 10 units in the morning and 5 units in the evening with meal   Yes [provider]  insulin glargine (LANTUS) 100 UNIT/ML injection Inject 30-40 Units 2 (two) times daily into the skin. Inject 30 units in the morning at 40 units at bedtime.   Yes [provider]  isosorbide mononitrate (IMDUR) 30 MG 24 hr tablet Take 1 tablet (30 mg total) by mouth 2 (two) times daily. 08/12/16  Yes Darylene Price A, FNP  levETIRAcetam (KEPPRA) 750 MG tablet Take 1 tablet (750 mg total) by mouth 2 (two) times daily. 06/05/15  Yes Rai, Ripudeep K, MD  losartan (COZAAR) 100 MG tablet Take 1 tablet (100 mg total) by mouth daily. 08/12/16  Yes Hackney, Tina A, FNP  meloxicam (MOBIC) 7.5  MG tablet Take 7.5 mg daily by mouth.   Yes [provider]  nitroGLYCERIN (NITROSTAT) 0.4 MG SL tablet Place 1 tablet (0.4 mg total) under the tongue every 5 (five) minutes as needed for chest pain. 07/01/16 07/01/17 Yes Gollan, Kathlene November, MD  polyethylene glycol-electrolytes (NULYTELY/GOLYTELY) 420 g solution Take 4,000 mLs by mouth once.   Yes [provider]  potassium chloride SA (K-DUR,KLOR-CON) 20 MEQ tablet Take 20 mEq daily by mouth.    Yes [provider]  tiotropium (SPIRIVA) 18 MCG inhalation capsule Place 18 mcg into inhaler and inhale daily.   Yes [provider]  traMADol (ULTRAM) 50 MG tablet Take 2 tablets (100 mg total) by mouth 4 (four) times daily. 04/24/17  Yes Molli Barrows, MD  traZODone (DESYREL) 50 MG tablet Take 50 mg by mouth at bedtime.   Yes [provider]  UNIFINE PENTIPS 31G X 6 MM [place]  07/23/16  Yes [provider]    Allergies as of 04/13/2017 - Review Complete 03/16/2017  Allergen Reaction Noted  . Aspirin Anaphylaxis 04/14/2015    Family History  Problem Relation Age of Onset  . Diabetes Unknown   . Hypertension Unknown   . Diabetes Mother   . Heart failure Mother   . Heart disease Mother   . Heart attack Mother   . Stroke Mother   . Depression Mother   . Hypertension Mother   . Cancer Sister        brain  . Hypertension Sister   . Diabetes Brother   . Hypertension Brother   . Heart failure Sister   . Heart attack Sister   . SIDS Sister     Social History   Socioeconomic History  . Marital status: Widowed    Spouse name: Not on file  . Number of children: Not on file  . Years of education: Not on file  . Highest education level: Not on file  Social Needs  . Financial resource strain: Not on file  . Food insecurity - worry: Not on file  . Food insecurity - inability: Not on file  . Transportation needs - medical: Not on file  . Transportation needs - non-medical: Not on file   Occupational History  . Not on file  Tobacco Use  . Smoking status: Former Smoker    Packs/day: 0.25    Years: 20.00    Pack years: 5.00    Types: Cigarettes    Last attempt to quit: 07/01/2006    Years since quitting: 10.8  . Smokeless tobacco: Never Used  Substance and Sexual Activity  . Alcohol use: No    Alcohol/week: 0.0 oz  . Drug use: No  .  Sexual activity: Yes  Other Topics Concern  . Not on file  Social History Narrative   Lives locally.  Does not routinely exercise.    Review of Systems: See HPI, otherwise negative ROS  Physical Exam: BP 136/89   Pulse 64   Temp 98.3 F (36.8 C) (Tympanic)   Resp (!) 24   Ht '5\' 11"'  (1.803 m)   Wt (!) 141.1 kg (311 lb)   SpO2 100%   BMI 43.38 kg/m  General:   Alert,  pleasant and cooperative in NAD Head:  Normocephalic and atraumatic. Neck:  Supple; no masses or thyromegaly. Lungs:  Clear throughout to auscultation.    Heart:  Regular rate and rhythm. Abdomen:  Soft, nontender and nondistended. Normal bowel sounds, without guarding, and without rebound.  Very obese abdomen.  Neurologic:  Alert and  oriented x4;  grossly normal neurologically.  Impression/Plan: [name] is here for an endoscopy and colonoscopy to be performed for dysphagia and iron def anemia.  Risks, benefits, limitations, and alternatives regarding  endoscopy and colonoscopy have been reviewed with the patient.  Questions have been answered.  All parties agreeable.   Gaylyn Cheers, MD  05/04/2017, 9:31 AM

## 2017-05-04 NOTE — Transfer of Care (Signed)
Immediate Anesthesia Transfer of Care Note  Patient: Tina Hayes  Procedure(s) Performed: ESOPHAGOGASTRODUODENOSCOPY (EGD) WITH PROPOFOL (N/A ) COLONOSCOPY WITH PROPOFOL (N/A )  Patient Location: PACU  Anesthesia Type:General  Level of Consciousness: sedated  Airway & Oxygen Therapy: Patient Spontanous Breathing and Patient connected to nasal cannula oxygen  Post-op Assessment: Report given to RN and Post -op Vital signs reviewed and stable  Post vital signs: Reviewed and stable  Last Vitals:  Vitals:   05/04/17 0904 05/04/17 1018  BP: 136/89 (!) 185/85  Pulse: 64 (!) 59  Resp: (!) 24 11  Temp: 36.8 C (!) 36.2 C  SpO2: 100% 100%    Last Pain:  Vitals:   05/04/17 1018  TempSrc: Tympanic  PainSc:          Complications: No apparent anesthesia complications

## 2017-05-04 NOTE — Anesthesia Preprocedure Evaluation (Signed)
Anesthesia Evaluation  Patient identified by MRN, date of birth, ID band Patient awake    Reviewed: Allergy & Precautions, H&P , NPO status , Patient's Chart, lab work & pertinent test results, reviewed documented beta blocker date and time   Airway Mallampati: I  TM Distance: >3 FB Neck ROM: full    Dental  (+) Edentulous Upper, Dental Advidsory Given, Poor Dentition, Missing   Pulmonary shortness of breath and with exertion, asthma , neg sleep apnea, COPD,  COPD inhaler, neg recent URI, former smoker,           Cardiovascular Exercise Tolerance: Good hypertension, + angina (stable angina) + CAD, + Past MI, + Cardiac Stents, + CABG and +CHF  (-) dysrhythmias (-) Valvular Problems/Murmurs     Neuro/Psych Seizures -,  PSYCHIATRIC DISORDERS    GI/Hepatic negative GI ROS, Neg liver ROS,   Endo/Other  diabetesMorbid obesity  Renal/GU negative Renal ROS  negative genitourinary   Musculoskeletal   Abdominal   Peds  Hematology negative hematology ROS (+)   Anesthesia Other Findings Past Medical History: No date: (HFpEF) heart failure with preserved ejection fraction ([place])     Comment:  a. 05/2016 Echo: EF 60-65%, mild to mod LVH, Gr1 DD, mild              MR, mildly dil LA, mod TR, mildly to mod increased PASP. No date: Anxiety No date: CHF (congestive heart failure) ([facility]) No date: Chronic back pain No date: COPD (chronic obstructive pulmonary disease) ([facility]) No date: Coronary artery disease     Comment:  a. s/p remote PCI x 5;  b. 2006 s/p CABG x 3               ([place], [place]); b. 05/2016               MV: attenuation corrected images w/o ischemia or               wma-->Med rx. No date: Depression No date: Diabetes mellitus without complication ([facility]) No date: Heart attack [facility])     Comment:  Total of 3 per pt. No date: Hypertension No date: Seizures ([facility])    Reproductive/Obstetrics negative OB ROS                             Anesthesia Physical Anesthesia Plan  ASA: III  Anesthesia Plan: General   Post-op Pain Management:    Induction: Intravenous  PONV Risk Score and Plan: 3 and Propofol infusion  Airway Management Planned: Nasal Cannula  Additional Equipment:   Intra-op Plan:   Post-operative Plan:   Informed Consent: I have reviewed the patients History and Physical, chart, labs and discussed the procedure including the risks, benefits and alternatives for the proposed anesthesia with the patient or authorized representative who has indicated his/her understanding and acceptance.   Dental Advisory Given  Plan Discussed with: Anesthesiologist, CRNA and Surgeon  Anesthesia Plan Comments:         Anesthesia Quick Evaluation

## 2017-05-04 NOTE — Anesthesia Post-op Follow-up Note (Signed)
Anesthesia QCDR form completed.        

## 2017-05-05 ENCOUNTER — Encounter: Payer: Self-pay | Admitting: Unknown Physician Specialty

## 2017-05-25 ENCOUNTER — Other Ambulatory Visit: Payer: Self-pay | Admitting: Anesthesiology

## 2017-05-25 ENCOUNTER — Ambulatory Visit (HOSPITAL_BASED_OUTPATIENT_CLINIC_OR_DEPARTMENT_OTHER): Payer: Medicare HMO | Admitting: Anesthesiology

## 2017-05-25 ENCOUNTER — Encounter: Payer: Self-pay | Admitting: Anesthesiology

## 2017-05-25 ENCOUNTER — Ambulatory Visit
Admission: RE | Admit: 2017-05-25 | Discharge: 2017-05-25 | Disposition: A | Discharge status: Home / Self Care | Payer: Medicare HMO | Source: Ambulatory Visit | Attending: Anesthesiology | Admitting: Anesthesiology

## 2017-05-25 VITALS — BP 125/58 | HR 59 | Temp 98.1°F | Resp 18 | Ht 71.0 in | Wt 310.0 lb

## 2017-05-25 DIAGNOSIS — M5432 Sciatica, left side: Secondary | ICD-10-CM | POA: Diagnosis not present

## 2017-05-25 DIAGNOSIS — Z791 Long term (current) use of non-steroidal anti-inflammatories (NSAID): Secondary | ICD-10-CM | POA: Insufficient documentation

## 2017-05-25 DIAGNOSIS — M25512 Pain in left shoulder: Secondary | ICD-10-CM | POA: Diagnosis not present

## 2017-05-25 DIAGNOSIS — M170 Bilateral primary osteoarthritis of knee: Secondary | ICD-10-CM | POA: Diagnosis not present

## 2017-05-25 DIAGNOSIS — Z7902 Long term (current) use of antithrombotics/antiplatelets: Secondary | ICD-10-CM | POA: Diagnosis not present

## 2017-05-25 DIAGNOSIS — M5136 Other intervertebral disc degeneration, lumbar region: Secondary | ICD-10-CM

## 2017-05-25 DIAGNOSIS — M4697 Unspecified inflammatory spondylopathy, lumbosacral region: Secondary | ICD-10-CM

## 2017-05-25 DIAGNOSIS — M48062 Spinal stenosis, lumbar region with neurogenic claudication: Secondary | ICD-10-CM | POA: Diagnosis not present

## 2017-05-25 DIAGNOSIS — M5431 Sciatica, right side: Secondary | ICD-10-CM | POA: Insufficient documentation

## 2017-05-25 DIAGNOSIS — Z794 Long term (current) use of insulin: Secondary | ICD-10-CM | POA: Diagnosis not present

## 2017-05-25 DIAGNOSIS — M47817 Spondylosis without myelopathy or radiculopathy, lumbosacral region: Secondary | ICD-10-CM

## 2017-05-25 DIAGNOSIS — R52 Pain, unspecified: Secondary | ICD-10-CM

## 2017-05-25 DIAGNOSIS — Z79899 Other long term (current) drug therapy: Secondary | ICD-10-CM | POA: Insufficient documentation

## 2017-05-25 DIAGNOSIS — Z79891 Long term (current) use of opiate analgesic: Secondary | ICD-10-CM | POA: Diagnosis not present

## 2017-05-25 MED ORDER — MIDAZOLAM HCL 5 MG/5ML IJ SOLN
INTRAMUSCULAR | Status: AC
  Filled 2017-05-25: qty 5

## 2017-05-25 MED ORDER — TRIAMCINOLONE ACETONIDE 40 MG/ML IJ SUSP
INTRAMUSCULAR | Status: AC
  Filled 2017-05-25: qty 1

## 2017-05-25 MED ORDER — LACTATED RINGERS IV SOLN
1000.0000 mL | INTRAVENOUS | Status: DC
  Administered 2017-05-25: 1000 mL via INTRAVENOUS

## 2017-05-25 MED ORDER — SODIUM CHLORIDE 0.9 % IJ SOLN
INTRAMUSCULAR | Status: AC
  Filled 2017-05-25: qty 10

## 2017-05-25 MED ORDER — SODIUM CHLORIDE 0.9% FLUSH
10.0000 mL | Freq: Once | INTRAVENOUS | Status: AC
  Administered 2017-05-25: 10 mL

## 2017-05-25 MED ORDER — ROPIVACAINE HCL 2 MG/ML IJ SOLN
INTRAMUSCULAR | Status: AC
  Filled 2017-05-25: qty 10

## 2017-05-25 MED ORDER — IOPAMIDOL (ISOVUE-M 200) INJECTION 41%
INTRAMUSCULAR | Status: AC
  Filled 2017-05-25: qty 10

## 2017-05-25 MED ORDER — LIDOCAINE HCL (PF) 1 % IJ SOLN
5.0000 mL | Freq: Once | INTRAMUSCULAR | Status: AC
  Administered 2017-05-25: 5 mL via SUBCUTANEOUS

## 2017-05-25 MED ORDER — MIDAZOLAM HCL 5 MG/5ML IJ SOLN
5.0000 mg | Freq: Once | INTRAMUSCULAR | Status: AC
  Administered 2017-05-25: 5 mg via INTRAVENOUS

## 2017-05-25 MED ORDER — ROPIVACAINE HCL 2 MG/ML IJ SOLN
10.0000 mL | Freq: Once | INTRAMUSCULAR | Status: AC
  Administered 2017-05-25: 10 mL via EPIDURAL

## 2017-05-25 MED ORDER — TRIAMCINOLONE ACETONIDE 40 MG/ML IJ SUSP
40.0000 mg | Freq: Once | INTRAMUSCULAR | Status: AC
  Administered 2017-05-25: 40 mg

## 2017-05-25 MED ORDER — IOPAMIDOL (ISOVUE-M 200) INJECTION 41%
20.0000 mL | Freq: Once | INTRAMUSCULAR | Status: DC | PRN
  Administered 2017-05-25: 10 mL
  Filled 2017-05-25: qty 20

## 2017-05-25 MED ORDER — LIDOCAINE HCL (PF) 1 % IJ SOLN
INTRAMUSCULAR | Status: AC
  Filled 2017-05-25: qty 5

## 2017-05-25 NOTE — Progress Notes (Signed)
Subjective:  Patient ID: Tina Hayes, female    DOB: 1946/02/29  Age: 72 y.o. MRN: [id number]  CC: Back Pain (lower middle)   Procedure: L5-S1 epidural steroid under fluoroscopic guidance with sedation  HPI Tina Hayes presents for reevaluation.  She did very well with her last epidural.  She states that this enables her to ambulate better stand for longer periods of time and sleep better at night.  No new weakness is reported to the lower extremities and her bowel bladder function is been stable as well.  She desires to proceed with a repeat epidural today.  She feels that she did very well with her last injection.  Otherwise she is using tramadol sparingly for pain medication and denies any diverting or illicit use with the medicines.    Outpatient Medications Prior to Visit  Medication Sig Dispense Refill  . ACCU-CHEK AVIVA PLUS test strip     . albuterol (PROVENTIL HFA;VENTOLIN HFA) 108 (90 Base) MCG/ACT inhaler Inhale 2 puffs into the lungs every 6 (six) hours as needed for wheezing or shortness of breath. 1 Inhaler 2  . albuterol (PROVENTIL) (2.5 MG/3ML) 0.083% nebulizer solution Take 2.5 mg every 4 (four) hours as needed by nebulization for wheezing or shortness of breath.    [name] atorvastatin (LIPITOR) 40 MG tablet Take 40 mg at bedtime by mouth.     . Blood Glucose Monitoring Suppl (ACCU-CHEK AVIVA PLUS) w/Device KIT     . Calcium Carbonate-Vitamin D3 (CALCIUM 600-D) 600-400 MG-UNIT TABS Take 1 tablet 2 (two) times daily by mouth.    . carvedilol (COREG) 3.125 MG tablet Take 1 tablet (3.125 mg total) by mouth 2 (two) times daily with a meal. 180 tablet 3  . cetirizine (ZYRTEC) 10 MG tablet Take 10 mg by mouth daily.    . citalopram (CELEXA) 20 MG tablet Take 20 mg by mouth daily.    . clopidogrel (PLAVIX) 75 MG tablet Take 1 tablet (75 mg total) by mouth daily. 30 tablet 0  . cyclobenzaprine (FLEXERIL) 5 MG tablet Take 5 mg 3 (three) times daily as needed by mouth for muscle  spasms.    [name] dextromethorphan (DELSYM) 30 MG/5ML liquid Take 15 mg every 12 (twelve) hours as needed by mouth for cough.    . esomeprazole (NEXIUM) 40 MG capsule Take 40 mg daily at 12 noon by mouth.    . ferrous sulfate 325 (65 FE) MG tablet Take 325 mg 3 (three) times daily with meals by mouth.    . furosemide (LASIX) 20 MG tablet Take 20-40 mg daily by mouth.     . insulin aspart (NOVOLOG) 100 UNIT/ML injection Inject 5-10 Units 2 (two) times daily with a meal into the skin. Take 10 units in the morning and 5 units in the evening with meal    . insulin glargine (LANTUS) 100 UNIT/ML injection Inject 30-40 Units 2 (two) times daily into the skin. Inject 30 units in the morning at 40 units at bedtime.    . isosorbide mononitrate (IMDUR) 30 MG 24 hr tablet Take 1 tablet (30 mg total) by mouth 2 (two) times daily. 180 tablet 3  . levETIRAcetam (KEPPRA) 750 MG tablet Take 1 tablet (750 mg total) by mouth 2 (two) times daily. 60 tablet 3  . losartan (COZAAR) 100 MG tablet Take 1 tablet (100 mg total) by mouth daily. 90 tablet 3  . meloxicam (MOBIC) 7.5 MG tablet Take 7.5 mg daily by mouth.    . nitroGLYCERIN (  NITROSTAT) 0.4 MG SL tablet Place 1 tablet (0.4 mg total) under the tongue every 5 (five) minutes as needed for chest pain. 25 tablet 6  . polyethylene glycol-electrolytes (NULYTELY/GOLYTELY) 420 g solution Take 4,000 mLs by mouth once.    . potassium chloride SA (K-DUR,KLOR-CON) 20 MEQ tablet Take 20 mEq daily by mouth.     . tiotropium (SPIRIVA) 18 MCG inhalation capsule Place 18 mcg into inhaler and inhale daily.    . traMADol (ULTRAM) 50 MG tablet Take 2 tablets (100 mg total) by mouth 4 (four) times daily. 240 tablet 2  . traZODone (DESYREL) 50 MG tablet Take 50 mg by mouth at bedtime.    [name] UNIFINE PENTIPS 31G X 6 MM MISC      No facility-administered medications prior to visit.     Review of Systems CNS: No confusion or sedation Cardiac: No angina or palpitations GI: No abdominal pain  or constipation Constitutional: No nausea vomiting fevers or chills  Objective:  BP 133/60   Pulse (!) 59   Temp 98 F (36.7 C)   Resp 18   Ht '5\' 11"'  (1.803 m)   Wt (!) 310 lb (140.6 kg)   SpO2 99%   BMI 43.24 kg/m    BP Readings from Last 3 Encounters:  05/25/17 133/60  05/04/17 (!) 178/77  04/24/17 139/82     Wt Readings from Last 3 Encounters:  05/25/17 (!) 310 lb (140.6 kg)  05/04/17 (!) 311 lb (141.1 kg)  04/24/17 (!) 316 lb (143.3 kg)     Physical Exam Pt is alert and oriented PERRL EOMI HEART IS RRR no murmur or rub LCTA no wheezing or rales MUSCULOSKELETAL reveals some mild paraspinous muscle tenderness in the lumbar region.  She ambulates with a antalgic gait using assistance.  Labs  Lab Results  Component Value Date   HGBA1C 8.5 (H) 01/29/2017   HGBA1C 8.5 (H) 06/17/2016   HGBA1C 8.0 (H) 06/03/2015   Lab Results  Component Value Date   LDLCALC 58 01/30/2017   CREATININE 0.81 03/16/2017    -------------------------------------------------------------------------------------------------------------------- Lab Results  Component Value Date   WBC 6.0 03/16/2017   HGB 10.4 (L) 03/16/2017   HCT 32.5 (L) 03/16/2017   PLT 242 03/16/2017   GLUCOSE 127 (H) 03/16/2017   CHOL 120 01/30/2017   TRIG 73 01/30/2017   HDL 47 01/30/2017   LDLCALC 58 01/30/2017   ALT 15 08/14/2016   AST 18 08/14/2016   NA 142 03/16/2017   K 3.6 03/16/2017   CL 106 03/16/2017   CREATININE 0.81 03/16/2017   BUN 21 (H) 03/16/2017   CO2 26 03/16/2017   INR 0.99 11/13/2016   HGBA1C 8.5 (H) 01/29/2017    --------------------------------------------------------------------------------------------------------------------- Dg C-arm 1-60 Min-no Report  Result Date: 05/25/2017 Fluoroscopy was utilized by the requesting physician.  No radiographic interpretation.     Assessment & Plan:   Tina Hayes was seen today for back pain.  Diagnoses and all orders for this  visit:  Facet arthritis of lumbosacral region [facility])  Bilateral sciatica -     Lumbar Epidural Injection  Primary osteoarthritis of both knees  DDD (degenerative disc disease), lumbar  Pain in joint of left shoulder  Spinal stenosis of lumbar region with neurogenic claudication  Other orders -     triamcinolone acetonide (KENALOG-40) injection 40 mg -     sodium chloride flush (NS) 0.9 % injection 10 mL -     ropivacaine (PF) 2 mg/mL (0.2%) (NAROPIN) injection 10 mL -  midazolam (VERSED) 5 MG/5ML injection 5 mg -     lidocaine (PF) (XYLOCAINE) 1 % injection 5 mL -     lactated ringers infusion 1,000 mL -     iopamidol (ISOVUE-M) 41 % intrathecal injection 20 mL        ----------------------------------------------------------------------------------------------------------------------  Problem List Items Addressed This Visit    None    Visit Diagnoses    Facet arthritis of lumbosacral region [facility])    -  Primary   Relevant Medications   triamcinolone acetonide (KENALOG-40) injection 40 mg (Completed)   Bilateral sciatica       Relevant Medications   midazolam (VERSED) 5 MG/5ML injection 5 mg (Completed)   Primary osteoarthritis of both knees       Relevant Medications   triamcinolone acetonide (KENALOG-40) injection 40 mg (Completed)   DDD (degenerative disc disease), lumbar       Relevant Medications   triamcinolone acetonide (KENALOG-40) injection 40 mg (Completed)   Pain in joint of left shoulder       Spinal stenosis of lumbar region with neurogenic claudication       Relevant Medications   triamcinolone acetonide (KENALOG-40) injection 40 mg (Completed)        ----------------------------------------------------------------------------------------------------------------------  1. Bilateral sciatica We will proceed with a repeat epidural injection today.  I am going to reschedule her for 80-monthreturn visit and repeat epidural at that time.  She will  likely be on a sustained period of injection therapy as she is failed conservative therapy.  The pain remains recalcitrant.  Based on her body habitus and other issues she is a nonsurgical candidate. - Lumbar Epidural Injection  2. Facet arthritis of lumbosacral region ([facility] As above  3. Primary osteoarthritis of both knees As above  4. DDD (degenerative disc disease), lumbar   5. Pain in joint of left shoulder   6. Spinal stenosis of lumbar region with neurogenic claudication As above    ----------------------------------------------------------------------------------------------------------------------  I am having Tina Hayes maintain her tiotropium, levETIRAcetam, traZODone, albuterol, cetirizine, citalopram, nitroGLYCERIN, ACCU-CHEK AVIVA PLUS, ACCU-CHEK AVIVA PLUS, UNIFINE PENTIPS, insulin aspart, losartan, carvedilol, isosorbide mononitrate, clopidogrel, atorvastatin, furosemide, insulin glargine, potassium chloride SA, esomeprazole, dextromethorphan, Calcium Carbonate-Vitamin D3, cyclobenzaprine, ferrous sulfate, albuterol, meloxicam, traMADol, and polyethylene glycol-electrolytes. We administered triamcinolone acetonide, sodium chloride flush, ropivacaine (PF) 2 mg/mL (0.2%), midazolam, lidocaine (PF), lactated ringers, and iopamidol.   Meds ordered this encounter  Medications  . triamcinolone acetonide (KENALOG-40) injection 40 mg  . sodium chloride flush (NS) 0.9 % injection 10 mL  . ropivacaine (PF) 2 mg/mL (0.2%) (NAROPIN) injection 10 mL  . midazolam (VERSED) 5 MG/5ML injection 5 mg  . lidocaine (PF) (XYLOCAINE) 1 % injection 5 mL  . lactated ringers infusion 1,000 mL  . iopamidol (ISOVUE-M) 41 % intrathecal injection 20 mL   Patient's Medications  New Prescriptions   No medications on file  Previous Medications   ACCU-CHEK AVIVA PLUS TEST STRIP       ALBUTEROL (PROVENTIL HFA;VENTOLIN HFA) 108 (90 BASE) MCG/ACT INHALER    Inhale 2 puffs into the lungs every  6 (six) hours as needed for wheezing or shortness of breath.   ALBUTEROL (PROVENTIL) (2.5 MG/3ML) 0.083% NEBULIZER SOLUTION    Take 2.5 mg every 4 (four) hours as needed by nebulization for wheezing or shortness of breath.   ATORVASTATIN (LIPITOR) 40 MG TABLET    Take 40 mg at bedtime by mouth.    BLOOD GLUCOSE MONITORING SUPPL (ACCU-CHEK AVIVA PLUS) W/DEVICE KIT  CALCIUM CARBONATE-VITAMIN D3 (CALCIUM 600-D) 600-400 MG-UNIT TABS    Take 1 tablet 2 (two) times daily by mouth.   CARVEDILOL (COREG) 3.125 MG TABLET    Take 1 tablet (3.125 mg total) by mouth 2 (two) times daily with a meal.   CETIRIZINE (ZYRTEC) 10 MG TABLET    Take 10 mg by mouth daily.   CITALOPRAM (CELEXA) 20 MG TABLET    Take 20 mg by mouth daily.   CLOPIDOGREL (PLAVIX) 75 MG TABLET    Take 1 tablet (75 mg total) by mouth daily.   CYCLOBENZAPRINE (FLEXERIL) 5 MG TABLET    Take 5 mg 3 (three) times daily as needed by mouth for muscle spasms.   DEXTROMETHORPHAN (DELSYM) 30 MG/5ML LIQUID    Take 15 mg every 12 (twelve) hours as needed by mouth for cough.   ESOMEPRAZOLE (NEXIUM) 40 MG CAPSULE    Take 40 mg daily at 12 noon by mouth.   FERROUS SULFATE 325 (65 FE) MG TABLET    Take 325 mg 3 (three) times daily with meals by mouth.   FUROSEMIDE (LASIX) 20 MG TABLET    Take 20-40 mg daily by mouth.    INSULIN ASPART (NOVOLOG) 100 UNIT/ML INJECTION    Inject 5-10 Units 2 (two) times daily with a meal into the skin. Take 10 units in the morning and 5 units in the evening with meal   INSULIN GLARGINE (LANTUS) 100 UNIT/ML INJECTION    Inject 30-40 Units 2 (two) times daily into the skin. Inject 30 units in the morning at 40 units at bedtime.   ISOSORBIDE MONONITRATE (IMDUR) 30 MG 24 HR TABLET    Take 1 tablet (30 mg total) by mouth 2 (two) times daily.   LEVETIRACETAM (KEPPRA) 750 MG TABLET    Take 1 tablet (750 mg total) by mouth 2 (two) times daily.   LOSARTAN (COZAAR) 100 MG TABLET    Take 1 tablet (100 mg total) by mouth daily.    MELOXICAM (MOBIC) 7.5 MG TABLET    Take 7.5 mg daily by mouth.   NITROGLYCERIN (NITROSTAT) 0.4 MG SL TABLET    Place 1 tablet (0.4 mg total) under the tongue every 5 (five) minutes as needed for chest pain.   POLYETHYLENE GLYCOL-ELECTROLYTES (NULYTELY/GOLYTELY) 420 G SOLUTION    Take 4,000 mLs by mouth once.   POTASSIUM CHLORIDE SA (K-DUR,KLOR-CON) 20 MEQ TABLET    Take 20 mEq daily by mouth.    TIOTROPIUM (SPIRIVA) 18 MCG INHALATION CAPSULE    Place 18 mcg into inhaler and inhale daily.   TRAMADOL (ULTRAM) 50 MG TABLET    Take 2 tablets (100 mg total) by mouth 4 (four) times daily.   TRAZODONE (DESYREL) 50 MG TABLET    Take 50 mg by mouth at bedtime.   UNIFINE PENTIPS 31G X 6 MM MISC      Modified Medications   No medications on file  Discontinued Medications   No medications on file   ----------------------------------------------------------------------------------------------------------------------  Follow-up: Return in about 3 months (around 08/23/2017) for evaluation, procedure.  Procedure: L5-S1 epidural steroid under fluoroscopic guidance with moderate sedation   Procedure: S1 LESI with fluoroscopic guidance and moderate sedation  NOTE: The risks, benefits, and expectations of the procedure have been discussed and explained to the patient who was understanding and in agreement with suggested treatment plan. No guarantees were made.  DESCRIPTION OF PROCEDURE: Lumbar epidural steroid injection with 2 IV Versed, EKG, blood pressure, pulse, and pulse oximetry monitoring. The procedure  was performed with the patient in the prone position under fluoroscopic guidance.  Sterile prep x3 was initiated and I then injected subcutaneous lidocaine to the overlying of S1 site after its fluoroscopic identifictation.  Using strict aseptic technique, I then advanced an 18-gauge 5 inch Tuohy epidural needle in the midline using interlaminar approach via loss-of-resistance to saline technique. There was  negative aspiration for heme or  CSF.  I then confirmed position with both AP and Lateral fluoroscan.  2 cc of Isovue were injected and a  total of 5 mL of Preservative-Free normal saline mixed with 40 mg of Kenalog and 1cc Ropicaine 0.2 percent were injected incrementally via the  epidurally placed needle. The needle was removed. The patient tolerated the injection well and was convalesced and discharged to home in stable condition. Should the patient have any post procedure difficulty they have been instructed on how to contact us for assistance.    Molli Barrows, MD

## 2017-05-25 NOTE — Progress Notes (Signed)
Safety precautions to be maintained throughout the outpatient stay will include: orient to surroundings, keep bed in low position, maintain call bell within reach at all times, provide assistance with transfer out of bed and ambulation.  

## 2017-05-25 NOTE — Patient Instructions (Signed)
Restart Plavix tomorrow.  Stop plavix for 7 days prior to procedure.   Post-procedure Information What to expect: Most procedures involve the use of a local anesthetic (numbing medicine), and a steroid (anti-inflammatory medicine).  The local anesthetics may cause temporary numbness and weakness of the legs or arms, depending on the location of the block. This numbness/weakness may last 4-6 hours, depending on the local anesthetic used. In rare instances, it can last up to 24 hours. While numb, you must be very careful not to injure the extremity.  After any procedure, you could expect the pain to get better within 15-20 minutes. This relief is temporary and may last 4-6 hours. Once the local anesthetics wears off, you could experience discomfort, possibly more than usual, for up to 10 (ten) days. In the case of radiofrequencies, it may last up to 6 weeks. Surgeries may take up to 8 weeks for the healing process. The discomfort is due to the irritation caused by needles going through skin and muscle. To minimize the discomfort, we recommend using ice the first day, and heat from then on. The ice should be applied for 15 minutes on, and 15 minutes off. Keep repeating this cycle until bedtime. Avoid applying the ice directly to the skin, to prevent frostbite. Heat should be used daily, until the pain improves (4-10 days). Be careful not to burn yourself.  Occasionally you may experience muscle spasms or cramps. These occur as a consequence of the irritation caused by the needle sticks to the muscle and the blood that will inevitably be lost into the surrounding muscle tissue. Blood tends to be very irritating to tissues, which tend to react by going into spasm. These spasms may start the same day of your procedure, but they may also take days to develop. This late onset type of spasm or cramp is usually caused by electrolyte imbalances triggered by the steroids, at the level of the kidney. Cramps and spasms  tend to respond well to muscle relaxants, multivitamins (some are triggered by the procedure, but may have their origins in vitamin deficiencies), and "Gatorade", or any sports drinks that can replenish any electrolyte imbalances. (If you are a diabetic, ask your pharmacist to get you a sugar-free brand.) Warm showers or baths may also be helpful. Stretching exercises are highly recommended. General Instructions:  Be alert for signs of possible infection: redness, swelling, heat, red streaks, elevated temperature, and/or fever. These typically appear 4 to 6 days after the procedure. Immediately notify your doctor if you experience unusual bleeding, difficulty breathing, or loss of bowel or bladder control. If you experience increased pain, do not increase your pain medicine intake, unless instructed by your pain physician. Post-Procedure Care:  Be careful in moving about. Muscle spasms in the area of the injection may occur. Applying ice or heat to the area is often helpful. The incidence of spinal headaches after epidural injections ranges between 1.4% and 6%. If you develop a headache that does not seem to respond to conservative therapy, please let your physician know. This can be treated with an epidural blood patch.   Post-procedure numbness or redness is to be expected, however it should average 4 to 6 hours. If numbness and weakness of your extremities begins to develop 4 to 6 hours after your procedure, and is felt to be progressing and worsening, immediately contact your physician.   Diet:  If you experience nausea, do not eat until this sensation goes away. If you had a "Stellate Ganglion  Block" for upper extremity "Reflex Sympathetic Dystrophy", do not eat or drink until your hoarseness goes away. In any case, always start with liquids first and if you tolerate them well, then slowly progress to more solid foods. Activity:  For the first 4 to 6 hours after the procedure, use caution in moving  about as you may experience numbness and/or weakness. Use caution in cooking, using household electrical appliances, and climbing steps. If you need to reach your Doctor call our office: [phone]) [phone] Monday-Thursday 8:00 am - 4:00 PM    Fridays: Closed     In case of an emergency: In case of emergency, call 911 or go to the nearest emergency room and have the physician there call us.  Interpretation of Procedure Every nerve block has two components: a diagnostic component, and a treatment component. Unrealistic expectations are the most common causes of "perceived failure".  In a perfect world, a single nerve block should be able to completely and permanently eliminate the pain. Sadly, the world is not perfect.  Most pain management nerve blocks are performed using local anesthetics and steroids. Steroids are responsible for any long-term benefit that you may experience. Their purpose is to decrease any chronic swelling that may exist in the area. Steroids begin to work immediately after being injected. However, most patients will not experience any benefits until 5 to 10 days after the injection, when the swelling has come down to the point where they can tell a difference. Steroids will only help if there is swelling to be treated. As such, they can assist with the diagnosis. If effective, they suggest an inflammatory component to the pain, and if ineffective, they rule out inflammation as the main cause or component of the problem. If the problem is one of mechanical compression, you will get no benefit from those steroids.   In the case of local anesthetics, they have a crucial role in the diagnosis of your condition. Most will begin to work within15 to 20 minutes after injection. The duration will depend on the type used (short- vs. Long-acting). It is of outmost importance that patients keep tract of their pain, after the procedure. To assist with this matter, a "Post-procedure Pain Diary" is  provided. Make sure to complete it and to bring it back to your follow-up appointment.  As long as the patient keeps accurate, detailed records of their symptoms after every procedure, and returns to have those interpreted, every procedure will provide [place] with invaluable information. Even a block that does not provide the patient with any relief, will always provide [place] with information about the mechanism and the origin of the pain. The only time a nerve block can be considered a waste of time is when patients do not keep track of the results, or do not keep their post-procedure appointment.  Reporting the results back to your physician The Pain Score  Pain is a subjective complaint. It cannot be seen, touched, or measured. We depend entirely on the patient's report of the pain in order to assess your condition and treatment. To evaluate the pain, we use a pain scale, where "0" means "No Pain", and a "10" is "the worst possible pain that you can even imagine" (i.e. something like been eaten alive by a shark or being torn apart by a lion).   You will frequently be asked to rate your pain. Please be as accurate, remember that medical decisions will be based on your responses. Please do not rate your  pain above a 10. Doing so is actually interpreted as "symptom magnification" (exaggeration), as well as lack of understanding with regards to the scale. To put this into perspective, when you tell us that your pain is at a 10 (ten), what you are saying is that there is nothing we can do to make this pain any worse. (Carefully think about that.)Preparing for Procedure with Sedation Instructions: . Oral Intake: Do not eat or drink anything for at least 8 hours prior to your procedure. . Transportation: Public transportation is not allowed. Bring an adult driver. The driver must be physically present in our waiting room before any procedure can be started. [name] Physical Assistance: Bring an adult capable of  physically assisting you, in the event you need help. . Blood Pressure Medicine: Take your blood pressure medicine with a sip of water the morning of the procedure. . Insulin: Take only  of your normal insulin dose. . Preventing infections: Shower with an antibacterial soap the morning of your procedure. . Build-up your immune system: Take 1000 mg of Vitamin C with every meal (3 times a day) the day prior to your procedure. . Pregnancy: If you are pregnant, call and cancel the procedure. . Sickness: If you have a cold, fever, or any active infections, call and cancel the procedure. . Arrival: You must be in the facility at least 30 minutes prior to your scheduled procedure. . Children: Do not bring children with you. . Dress appropriately: Bring dark clothing that you would not mind if they get stained. . Valuables: Do not bring any jewelry or valuables. Procedure appointments are reserved for interventional treatments only. [name] No Prescription Refills. . No medication changes will be discussed during procedure appointments. No disability issues will be discussed.

## 2017-05-26 ENCOUNTER — Telehealth: Payer: Self-pay | Admitting: *Deleted

## 2017-05-26 NOTE — Telephone Encounter (Signed)
LVM for patient to call for any post- procedure concerns

## 2017-05-27 ENCOUNTER — Other Ambulatory Visit: Payer: Self-pay | Admitting: Registered Nurse

## 2017-05-27 DIAGNOSIS — Z1231 Encounter for screening mammogram for malignant neoplasm of breast: Secondary | ICD-10-CM

## 2017-06-12 ENCOUNTER — Other Ambulatory Visit: Payer: Self-pay | Admitting: Registered Nurse

## 2017-06-12 DIAGNOSIS — L729 Follicular cyst of the skin and subcutaneous tissue, unspecified: Secondary | ICD-10-CM

## 2017-06-30 ENCOUNTER — Telehealth: Payer: Self-pay | Admitting: Family

## 2017-06-30 ENCOUNTER — Telehealth: Payer: Self-pay | Admitting: Cardiovascular Disease

## 2017-06-30 MED ORDER — METOLAZONE 2.5 MG PO TABS: 2.5000 mg | ORAL_TABLET | Freq: Every day | ORAL | 0 refills | Status: DC

## 2017-06-30 NOTE — Telephone Encounter (Signed)
   [facility] Pre-operative Risk Assessment    Request for surgical clearance:  1. What type of surgery is being performed? Dental procedure , extraction  2. When is this surgery scheduled? Not listed  3. What type of clearance is required (medical clearance vs. Pharmacy clearance to hold med vs. Both)? Not listed  4. Are there any medications that need to be held prior to surgery and how long? coag  5. Practice name and name of physician performing surgery? [facility]  6. What is your office phone and fax number? [phone], fax [phone]  7. Anesthesia type (None, local, MAC, general) ? Not listed   Lucienne Minks 06/30/2017, 9:45 AM  _________________________________________________________________   (provider comments below)

## 2017-06-30 NOTE — Telephone Encounter (Signed)
Spoke with patient and she denies any changes since last office visit. Advised that I would route clearance over to Dr. Rockey Situ for review and would then send back to dental office. She was appreciative for the call with no further questions at this time.

## 2017-06-30 NOTE — Telephone Encounter (Signed)
Patient called to say that she has gained 7 pounds in the last week and currently weighs 320 pounds. She says that she's more short of breath and has noticed an increase in swelling in her legs. She has recently been out of time visiting her sick brother.   Currently she's taking furosemide 60mg  AM/ 40mg  PM along with potassium 76meq BID. Will send in metolazone 2.5mg  daily for the next 3 days. Advised her to take it 1/2 hour prior to furosemide dose and to decrease her furosemide to 40mg  BID while taking the metolazone. She was also instructed to increase her potassium to 76meq BID while taking the metolazone.   Will see patient in clinic on 07/07/17 at 8:40am for an office visit and to check lab work. Patient verbalized understanding.

## 2017-06-30 NOTE — Telephone Encounter (Signed)
Patient is currently on plavix- to Dr. Rockey Situ to review.

## 2017-07-01 ENCOUNTER — Ambulatory Visit
Admission: RE | Admit: 2017-07-01 | Discharge: 2017-07-01 | Disposition: A | Discharge status: Home / Self Care | Payer: Medicare HMO | Source: Ambulatory Visit | Attending: Registered Nurse | Admitting: Registered Nurse

## 2017-07-01 ENCOUNTER — Other Ambulatory Visit: Payer: Self-pay | Admitting: Family

## 2017-07-01 DIAGNOSIS — L729 Follicular cyst of the skin and subcutaneous tissue, unspecified: Secondary | ICD-10-CM | POA: Diagnosis present

## 2017-07-01 MED ORDER — METOLAZONE 2.5 MG PO TABS: 2.5000 mg | ORAL_TABLET | Freq: Every day | ORAL | 0 refills | Status: DC

## 2017-07-01 NOTE — Telephone Encounter (Signed)
Okay to stop Plavix 5 days prior to procedure Would start low-dose aspirin when she is not taking Plavix

## 2017-07-01 NOTE — Telephone Encounter (Signed)
Left voicemail message for patient to call back and will route message over to number listed below.

## 2017-07-01 NOTE — Telephone Encounter (Signed)
Spoke with patient and reviewed instructions for holding medication prior to her dental procedure. Also advised to take aspirin 81 mg during that time. She verbalized understanding with no further questions at this time. Message routed to number listed with instructions to call back if needed.

## 2017-07-02 ENCOUNTER — Other Ambulatory Visit: Payer: Self-pay | Admitting: Registered Nurse

## 2017-07-02 ENCOUNTER — Other Ambulatory Visit (HOSPITAL_COMMUNITY): Payer: Self-pay | Admitting: Registered Nurse

## 2017-07-06 NOTE — Progress Notes (Signed)
Patient ID: [name], female    DOB: 1946/01/29, 72 y.o.   MRN: [id number]  HPI  Ms Rigdon is a 72 y/o female with a history of seizures, HTN, cardiac stents, DM, depression, CAD, COPD, chronic back pain, anxiety, obesity, remote tobacco use and chronic heart failure.   Last echo was done 06/17/16 and showed an EF of 60-65% along with mild MR and moderate TR. Pulmonary systolic pressure was mildly to moderately elevated. Stress test done 06/18/16.  Was in the ED 03/16/17 due to chest pain where she was treated and released. Admitted 01/29/17 due to atypical chest pain. Cardiology consult was obtained. She was discharged the next day.   She presents today for her follow-up visit with a chief complaint of moderate fatigue with minimal exertion. She describes this as chronic in nature having been present for several years although does feel like it's worsening over these last few weeks. She has associated shortness of breath, edema, abdominal distention, light-headedness, difficulty sleeping and depression along with this. She denies any cough, chest pain, palpitations or weight gain. Called the office 06/30/17 complaining of weight gain and shortness of breath. Treated with 3 days of 2.43m metolazone along with an increase in her potassium for those 3 days as well although she doesn't feel like the metolazone made a difference. Also having recurrent difficulty in swallowing her foods.   Past Medical History:  Diagnosis Date  . (HFpEF) heart failure with preserved ejection fraction ([facility]    a. 05/2016 Echo: EF 60-65%, mild to mod LVH, Gr1 DD, mild MR, mildly dil LA, mod TR, mildly to mod increased PASP.  .Marland KitchenAnxiety   . CHF (congestive heart failure) ([facility]   . Chronic back pain   . COPD (chronic obstructive pulmonary disease) ([facility]   . Coronary artery disease    a. s/p remote PCI x 5;  b. 2006 s/[place] CABG [place] 3 [place]; b. 05/2016 MV: attenuation corrected images w/o  ischemia or wma-->Med rx.  . Depression   . Diabetes mellitus without complication ([facility]   . Heart attack ([facility]    Total of 3 per pt.  . Hypertension   . [place]izures [place] [facility]    Past Surgical History:  Procedure Laterality Date  . ANKLE SURGERY    . COLONOSCOPY WITH PROPOFOL N/A 05/04/2017   Procedure: COLONOSCOPY WITH PROPOFOL;  Surgeon: EManya Silvas MD;  [facility];  Service: Endoscopy;  Laterality: N/A;  . CORONARY ANGIOPLASTY    . CORONARY ARTERY BYPASS GRAFT    . ESOPHAGOGASTRODUODENOSCOPY (EGD) WITH PROPOFOL N/A 05/04/2017   Procedure: ESOPHAGOGASTRODUODENOSCOPY (EGD) WITH PROPOFOL;  Surgeon: EManya Silvas MD;  [facility];  Service: Endoscopy;  Laterality: N/A;  . FRACTURE SURGERY    . KNEE SURGERY     Family History  Problem Relation Age of Onset  . Diabetes Unknown   . Hypertension Unknown   . Diabetes Mother   . Heart failure Mother   . Heart disease Mother   . Heart attack Mother   . Stroke Mother   . Depression Mother   . Hypertension Mother   . Cancer Sister        brain  . Hypertension Sister   . Diabetes Brother   . Hypertension Brother   . Heart failure Sister   . Heart attack Sister   . SIDS Sister    Social History   Tobacco Use  . Smoking status: Former Smoker  Packs/day: 0.25    Years: 20.00    Pack years: 5.00    Types: Cigarettes    Last attempt to quit: 07/01/2006    Years since quitting: 11.0  . Smokeless tobacco: Never Used  Substance Use Topics  . Alcohol use: No    Alcohol/week: 0.0 oz   Allergies  Allergen Reactions  . Aspirin Anaphylaxis   Prior to Admission medications   Medication Sig Start Date End Date Taking? Authorizing Provider  ACCU-CHEK AVIVA PLUS test strip  05/19/16  Yes [provider]  albuterol (PROVENTIL HFA;VENTOLIN HFA) 108 (90 Base) MCG/ACT inhaler Inhale 2 puffs into the lungs every 6 (six) hours as needed for wheezing or shortness of breath. 08/30/15  Yes Epifanio Lesches, MD  albuterol (PROVENTIL) (2.5 MG/3ML) 0.083% nebulizer solution Take 2.5 mg every 4 (four) hours as needed by nebulization for wheezing or shortness of breath.   Yes [provider]  atorvastatin (LIPITOR) 40 MG tablet Take 40 mg at bedtime by mouth.    Yes [provider]  Blood Glucose Monitoring Suppl (ACCU-CHEK AVIVA PLUS) w/Device KIT  06/13/16  Yes [provider]  Calcium Carbonate-Vitamin D3 (CALCIUM 600-D) 600-400 MG-UNIT TABS Take 1 tablet 2 (two) times daily by mouth.   Yes [provider]  carvedilol (COREG) 3.125 MG tablet Take 1 tablet (3.125 mg total) by mouth 2 (two) times daily with a meal. 08/12/16  Yes Jinnifer Montejano A, FNP  cetirizine (ZYRTEC) 10 MG tablet Take 10 mg by mouth daily.   Yes [provider]  citalopram (CELEXA) 20 MG tablet Take 20 mg by mouth daily.   Yes [provider]  clopidogrel (PLAVIX) 75 MG tablet Take 1 tablet (75 mg total) by mouth daily. 08/14/16  Yes Minna Merritts, MD  dextromethorphan (DELSYM) 30 MG/5ML liquid Take 15 mg every 12 (twelve) hours as needed by mouth for cough.   Yes [provider]  esomeprazole (NEXIUM) 40 MG capsule Take 40 mg daily at 12 noon by mouth.   Yes [provider]  ferrous sulfate 325 (65 FE) MG tablet Take 325 mg 3 (three) times daily with meals by mouth.   Yes [provider]  furosemide (LASIX) 20 MG tablet Take 20-40 mg daily by mouth.    Yes [provider]  insulin aspart (NOVOLOG) 100 UNIT/ML injection Inject 5-10 Units 2 (two) times daily with a meal into the skin. Take 10 units in the morning and 5 units in the evening with meal   Yes [provider]  insulin glargine (LANTUS) 100 UNIT/ML injection Inject 30-40 Units 2 (two) times daily into the skin. Inject 30 units in the morning at 40 units at bedtime.   Yes [provider]  isosorbide mononitrate (IMDUR) 30 MG 24 hr tablet Take 1 tablet (30 mg  total) by mouth 2 (two) times daily. 08/12/16  Yes Darylene Price A, FNP  levETIRAcetam (KEPPRA) 750 MG tablet Take 1 tablet (750 mg total) by mouth 2 (two) times daily. 06/05/15  Yes Rai, Ripudeep K, MD  losartan (COZAAR) 100 MG tablet Take 1 tablet (100 mg total) by mouth daily. 08/12/16  Yes Joesph Marcy A, FNP  meloxicam (MOBIC) 7.5 MG tablet Take 7.5 mg daily by mouth.   Yes [provider]  polyethylene glycol-electrolytes (NULYTELY/GOLYTELY) 420 g solution Take 4,000 mLs by mouth once.   Yes [provider]  potassium chloride SA (K-DUR,KLOR-CON) 20 MEQ tablet Take 20 mEq daily by mouth.  Yes [provider]  tiotropium (SPIRIVA) 18 MCG inhalation capsule Place 18 mcg into inhaler and inhale daily.   Yes [provider]  traMADol (ULTRAM) 50 MG tablet Take 2 tablets (100 mg total) by mouth 4 (four) times daily. 04/24/17  Yes Molli Barrows, MD  traZODone (DESYREL) 50 MG tablet Take 50 mg by mouth at bedtime.   Yes [provider]  UNIFINE PENTIPS 31G X 6 MM Rossie  07/23/16  Yes [provider]  nitroGLYCERIN (NITROSTAT) 0.4 MG SL tablet Place 1 tablet (0.4 mg total) under the tongue every 5 (five) minutes as needed for chest pain. 07/01/16 07/01/17  Minna Merritts, MD    Review of Systems  Constitutional: Positive for fatigue. Negative for appetite change.  HENT: Positive for trouble swallowing. Negative for congestion, postnasal drip and sore throat.   Eyes: Negative.   Respiratory: Positive for shortness of breath. Negative for cough and chest tightness.   Cardiovascular: Positive for leg swelling. Negative for chest pain and palpitations.  Gastrointestinal: Positive for abdominal distention. Negative for abdominal pain and nausea.  Endocrine: Negative.   Genitourinary: Negative.   Musculoskeletal: Positive for arthralgias (left shoulder pain) and back pain (chronic). Negative for neck pain.  Skin: Negative.   Allergic/Immunologic:  Negative.   Neurological: Positive for weakness, light-headedness and headaches (last couple of days). Negative for dizziness.  Hematological: Negative for adenopathy. Does not bruise/bleed easily.  Psychiatric/Behavioral: Positive for dysphoric mood and sleep disturbance (sleeping interrupted chronically). Negative for suicidal ideas. The patient is not nervous/anxious.    Vitals:   07/07/17 0850  BP: 120/76  Pulse: 63  Resp: 20  SpO2: 99%  Weight: (!) 317 lb (143.8 kg)  Height: _0  (1.803 m)   Wt Readings from Last 3 Encounters:  07/07/17 (!) 317 lb (143.8 kg)  05/25/17 (!) 310 lb (140.6 kg)  05/04/17 (!) 311 lb (141.1 kg)   Lab Results  Component Value Date   CREATININE 0.81 03/16/2017   CREATININE 1.29 (H) 02/13/2017   CREATININE 1.72 (H) 02/02/2017    Physical Exam  Constitutional: She is oriented to person, place, and time. She appears well-developed and well-nourished.  HENT:  Head: Normocephalic and atraumatic.  Neck: Normal range of motion. Neck supple. No JVD present.  Cardiovascular: Regular rhythm. Bradycardia present.  Pulmonary/Chest: Effort normal. She has no wheezes. She has no rales.  Abdominal: Soft. She exhibits distension. There is no tenderness.  Musculoskeletal: She exhibits edema (1+ pitting edema in bilateral lower legs). She exhibits no tenderness.  Neurological: She is alert and oriented to person, place, and time.  Skin: Skin is warm and dry.  Psychiatric: She has a normal mood and affect. Her behavior is normal.  Nursing note and vitals reviewed.  Assessment & Plan:  1: Chronic heart failure with preserved ejection fraction- - NYHA class III - euvolemic today - weighing daily and says that her home weight has been stable.  Reminded to call for an overnight weight gain of >2 pounds or a weekly weight gain of >5 pounds.  - weight stable since she was last here - has not been adding salt to her foods and doesn't think she's eaten anything  salty recently - drinking 80-92 ounces of water daily in addition to a "mug" of coffee every morning. Discussed the importance of keeping her daily fluid intake to closer to 60 ounces daily and to suck on sugar free candy if her mouth remains dry  - saw her cardiologist (  Gollan) 02/17/17 - patient says that she's been on furosemide for quite some time. Will stop the furosemide and change to torsemide 93m BID - decrease potassium back down to 253m daily - patient reports receiving her flu vaccine for the season already - will get BMP today due to recent metolazone use  2: HTN- - BP looks good today - BMP from 03/16/17 reviewed; sodium 142, potassium 3.6 and GFR >60 - saw PCP (SAncil Boozer10/11/18   3: Diabetes-  - fasting glucose at home today was 167 - A1c on 01/29/17 was 8.5%  4: Dysphagia- - feels like her symptoms are returning where food feels stuck after eating and she has pain over her esophagus - encouraged her to call her GI provider and tell them that the symptoms are returning - drinking 2 ensure daily  - saw GI (Johonson) 04/01/17 - had EGD done 05/04/17  Patient did not bring her medications nor a list. Each medication was verbally reviewed with the patient and she was encouraged to bring the bottles to every visit to confirm accuracy of list.  Return next week to see how the torsemide is doing. Will probably get lab work next week as well since diuretic was changed.

## 2017-07-07 ENCOUNTER — Encounter: Payer: Self-pay | Admitting: Family

## 2017-07-07 ENCOUNTER — Ambulatory Visit: Payer: Medicare HMO | Attending: Family | Admitting: Family

## 2017-07-07 ENCOUNTER — Telehealth: Payer: Self-pay | Admitting: Family

## 2017-07-07 VITALS — BP 120/76 | HR 63 | Resp 20 | Ht 71.0 in | Wt 317.0 lb

## 2017-07-07 DIAGNOSIS — J449 Chronic obstructive pulmonary disease, unspecified: Secondary | ICD-10-CM | POA: Diagnosis not present

## 2017-07-07 DIAGNOSIS — I110 Hypertensive heart disease with heart failure: Secondary | ICD-10-CM | POA: Diagnosis present

## 2017-07-07 DIAGNOSIS — R1310 Dysphagia, unspecified: Secondary | ICD-10-CM | POA: Diagnosis not present

## 2017-07-07 DIAGNOSIS — I5032 Chronic diastolic (congestive) heart failure: Secondary | ICD-10-CM

## 2017-07-07 DIAGNOSIS — Z79899 Other long term (current) drug therapy: Secondary | ICD-10-CM | POA: Diagnosis not present

## 2017-07-07 DIAGNOSIS — Z8249 Family history of ischemic heart disease and other diseases of the circulatory system: Secondary | ICD-10-CM | POA: Diagnosis not present

## 2017-07-07 DIAGNOSIS — Z823 Family history of stroke: Secondary | ICD-10-CM | POA: Insufficient documentation

## 2017-07-07 DIAGNOSIS — Z794 Long term (current) use of insulin: Secondary | ICD-10-CM | POA: Diagnosis not present

## 2017-07-07 DIAGNOSIS — I10 Essential (primary) hypertension: Secondary | ICD-10-CM

## 2017-07-07 DIAGNOSIS — I509 Heart failure, unspecified: Secondary | ICD-10-CM | POA: Insufficient documentation

## 2017-07-07 DIAGNOSIS — Z87891 Personal history of nicotine dependence: Secondary | ICD-10-CM | POA: Diagnosis not present

## 2017-07-07 DIAGNOSIS — Z833 Family history of diabetes mellitus: Secondary | ICD-10-CM | POA: Diagnosis not present

## 2017-07-07 DIAGNOSIS — Z9889 Other specified postprocedural states: Secondary | ICD-10-CM | POA: Diagnosis not present

## 2017-07-07 DIAGNOSIS — E119 Type 2 diabetes mellitus without complications: Secondary | ICD-10-CM | POA: Insufficient documentation

## 2017-07-07 LAB — BASIC METABOLIC PANEL
ANION GAP: 12 (ref 5–15)
BUN: 28 mg/dL — ABNORMAL HIGH (ref 6–20)
CHLORIDE: 94 mmol/L — AB (ref 101–111)
CO2: 31 mmol/L (ref 22–32)
Calcium: 9.2 mg/dL (ref 8.9–10.3)
Creatinine, Ser: 0.96 mg/dL (ref 0.44–1.00)
GFR, EST NON AFRICAN AMERICAN: 59 mL/min — AB (ref >60)
Glucose, Bld: 183 mg/dL — ABNORMAL HIGH (ref 65–99)
Potassium: 2.8 mmol/L — ABNORMAL LOW (ref 3.5–5.1)
SODIUM: 137 mmol/L (ref 135–145)

## 2017-07-07 MED ORDER — POTASSIUM CHLORIDE CRYS ER 20 MEQ PO TBCR: 40.0000 meq | EXTENDED_RELEASE_TABLET | Freq: Three times a day (TID) | ORAL | 3 refills | Status: DC

## 2017-07-07 MED ORDER — TORSEMIDE 20 MG PO TABS: 40.0000 mg | ORAL_TABLET | Freq: Two times a day (BID) | ORAL | 3 refills | Status: DC

## 2017-07-07 NOTE — Telephone Encounter (Signed)
Spoke with patient regarding lab results that were obtained today (07/07/17). Patient was recently treated with 2.5mg  metolazone for 3 days along with an increase in her potassium dosage for those 3 days.  Renal function looks good today but potassium has dropped to 2.8. Advised patient to take 88meq (2 tablets) three times daily until she returns next week. Will plan on rechecking her labs next week.

## 2017-07-07 NOTE — Patient Instructions (Addendum)
Continue weighing daily and call for an overnight weight gain of > 2 pounds or a weekly weight gain of >5 pounds.  Stop taking the furosemide and begin torsemide 2 tablets twice daily

## 2017-07-14 HISTORY — PX: MOUTH SURGERY: SHX715

## 2017-07-15 NOTE — Progress Notes (Signed)
Patient ID: Tina Hayes, female    DOB: 1946-01-12, 72 y.o.   MRN: [id number]  HPI  Tina Hayes is a 72 y/o female with a history of seizures, HTN, cardiac stents, DM, depression, CAD, COPD, chronic back pain, anxiety, obesity, remote tobacco use and chronic heart failure.   Last echo was done 06/17/16 and showed an EF of 60-65% along with mild MR and moderate TR. Pulmonary systolic pressure was mildly to moderately elevated. Stress test done 06/18/16.  Was in the ED 03/16/17 due to chest pain where she was treated and released. Admitted 01/29/17 due to atypical chest pain. Cardiology consult was obtained. She was discharged the next day.   She presents today for her follow-up visit with a chief complaint of minimal shortness of breath upon moderate exertion. She says this has been chronic in nature having been present for several years. She does feel like her breathing has improved with the switch to torsemide. She has associated fatigue, edema (improving), headaches, weakness and difficulty sleeping. She denies any abdominal distention, palpitations, chest pain, cough, dizziness or weight gain. She has been taking 103mq potassium TID since she was last here due to hypokalemia.   Past Medical History:  Diagnosis Date  . (HFpEF) heart failure with preserved ejection fraction ([place]    a. 05/2016 Echo: EF 60-65%, mild to mod LVH, Gr1 DD, mild MR, mildly dil LA, mod TR, mildly to mod increased PASP.  .Marland KitchenAnxiety   . CHF (congestive heart failure) ([facility]   . Chronic back pain   . COPD (chronic obstructive pulmonary disease) ([place]   . Coronary artery disease    a. s/p remote PCI x 5;  b. 2006 s/p CABG x 3 [place]; b. 05/2016 MV: attenuation corrected images w/o ischemia or wma-->Med rx.  . Depression   . [facility] without complication ([facility]   . Heart attack ([place]    Total of 3 per pt.  . Hypertension   . Seizures ([place]    Past Surgical History:  Procedure  Laterality Date  . ANKLE SURGERY    . COLONOSCOPY WITH PROPOFOL N/A 05/04/2017   Procedure: COLONOSCOPY WITH PROPOFOL;  Surgeon: EManya Silvas MD;  [facility];  Service: Endoscopy;  Laterality: N/A;  . CORONARY ANGIOPLASTY    . CORONARY ARTERY BYPASS GRAFT    . ESOPHAGOGASTRODUODENOSCOPY (EGD) WITH PROPOFOL N/A 05/04/2017   Procedure: ESOPHAGOGASTRODUODENOSCOPY (EGD) WITH PROPOFOL;  Surgeon: EManya Silvas MD;  [facility];  Service: Endoscopy;  Laterality: N/A;  . FRACTURE SURGERY    . KNEE SURGERY     Family History  Problem Relation Age of Onset  . Diabetes Unknown   . Hypertension Unknown   . Diabetes Mother   . Heart failure Mother   . Heart disease Mother   . Heart attack Mother   . Stroke Mother   . Depression Mother   . Hypertension Mother   . Cancer Sister        brain  . Hypertension Sister   . Diabetes Brother   . Hypertension Brother   . Heart failure Sister   . Heart attack Sister   . SIDS Sister    Social History   Tobacco Use  . Smoking status: Former Smoker    Packs/day: 0.25    Years: 20.00    Pack years: 5.00    Types: Cigarettes    Last attempt to quit: 07/01/2006    Years since quitting:  11.0  . Smokeless tobacco: Never Used  Substance Use Topics  . Alcohol use: No    Alcohol/week: 0.0 oz   Allergies  Allergen Reactions  . Aspirin Anaphylaxis   Prior to Admission medications   Medication Sig Start Date End Date Taking? Authorizing Provider  ACCU-CHEK AVIVA PLUS test strip  05/19/16  Yes [provider]  albuterol (PROVENTIL HFA;VENTOLIN HFA) 108 (90 Base) MCG/ACT inhaler Inhale 2 puffs into the lungs every 6 (six) hours as needed for wheezing or shortness of breath. 08/30/15  Yes Epifanio Lesches, MD  albuterol (PROVENTIL) (2.5 MG/3ML) 0.083% nebulizer solution Take 2.5 mg every 4 (four) hours as needed by nebulization for wheezing or shortness of breath.   Yes [provider]  atorvastatin  (LIPITOR) 40 MG tablet Take 40 mg at bedtime by mouth.    Yes [provider]  Blood Glucose Monitoring Suppl (ACCU-CHEK AVIVA PLUS) w/Device KIT  06/13/16  Yes [provider]  Calcium Carbonate-Vitamin D3 (CALCIUM 600-D) 600-400 MG-UNIT TABS Take 1 tablet 2 (two) times daily by mouth.   Yes [provider]  carvedilol (COREG) 3.125 MG tablet Take 1 tablet (3.125 mg total) by mouth 2 (two) times daily with a meal. 08/12/16  Yes Vance Belcourt A, FNP  cetirizine (ZYRTEC) 10 MG tablet Take 10 mg by mouth daily.   Yes [provider]  citalopram (CELEXA) 20 MG tablet Take 20 mg by mouth daily.   Yes [provider]  clopidogrel (PLAVIX) 75 MG tablet Take 1 tablet (75 mg total) by mouth daily. 08/14/16  Yes Minna Merritts, MD  dextromethorphan (DELSYM) 30 MG/5ML liquid Take 15 mg every 12 (twelve) hours as needed by mouth for cough.   Yes [provider]  esomeprazole (NEXIUM) 40 MG capsule Take 40 mg daily at 12 noon by mouth.   Yes [provider]  ferrous sulfate 325 (65 FE) MG tablet Take 325 mg 3 (three) times daily with meals by mouth.   Yes [provider]  insulin aspart (NOVOLOG) 100 UNIT/ML injection Inject 5-10 Units 2 (two) times daily with a meal into the skin. Take 10 units in the morning and 5 units in the evening with meal   Yes [provider]  insulin glargine (LANTUS) 100 UNIT/ML injection Inject 30-40 Units 2 (two) times daily into the skin. Inject 30 units in the morning at 40 units at bedtime.   Yes [provider]  isosorbide mononitrate (IMDUR) 30 MG 24 hr tablet Take 1 tablet (30 mg total) by mouth 2 (two) times daily. 08/12/16  Yes Darylene Price A, FNP  levETIRAcetam (KEPPRA) 750 MG tablet Take 1 tablet (750 mg total) by mouth 2 (two) times daily. 06/05/15  Yes Rai, Ripudeep K, MD  losartan (COZAAR) 100 MG tablet Take 1 tablet (100 mg total) by mouth daily. Patient taking differently: Take 50  mg by mouth daily.  08/12/16  Yes Zia Kanner A, FNP  meloxicam (MOBIC) 7.5 MG tablet Take 7.5 mg daily by mouth.   Yes [provider]  polyethylene glycol-electrolytes (NULYTELY/GOLYTELY) 420 g solution Take 4,000 mLs by mouth once.   Yes [provider]  potassium chloride SA (K-DUR,KLOR-CON) 20 MEQ tablet Take 20 mEq daily by mouth.    Yes [provider]  potassium chloride SA (K-DUR,KLOR-CON) 20 MEQ tablet Take 2 tablets (40 mEq total) by mouth 3 (three) times daily. 07/07/17 10/05/17 Yes Darylene Price A, FNP  tiotropium (SPIRIVA) 18 MCG inhalation capsule Place 18  mcg into inhaler and inhale daily.   Yes [provider]  torsemide (DEMADEX) 20 MG tablet Take 2 tablets (40 mg total) by mouth 2 (two) times daily. 07/07/17 10/05/17 Yes Jeylin Woodmansee, Aura Fey, FNP  traMADol (ULTRAM) 50 MG tablet Take 2 tablets (100 mg total) by mouth 4 (four) times daily. 04/24/17  Yes Molli Barrows, MD  traZODone (DESYREL) 50 MG tablet Take 50 mg by mouth at bedtime.   Yes [provider]  UNIFINE PENTIPS 31G X 6 MM [place]  07/23/16  Yes [provider]  nitroGLYCERIN (NITROSTAT) 0.4 MG SL tablet Place 1 tablet (0.4 mg total) under the tongue every 5 (five) minutes as needed for chest pain. 07/01/16 07/01/17  Minna Merritts, MD   Review of Systems  Constitutional: Positive for fatigue. Negative for appetite change.  HENT: Positive for trouble swallowing. Negative for congestion, postnasal drip and sore throat.        Mouth sore due to recent dental surgery   Eyes: Negative.   Respiratory: Positive for shortness of breath (much better since torsemide). Negative for cough and chest tightness.   Cardiovascular: Positive for leg swelling. Negative for chest pain and palpitations.  Gastrointestinal: Negative for abdominal distention, abdominal pain and nausea.  Endocrine: Negative.   Genitourinary: Negative.   Musculoskeletal: Positive for arthralgias (left shoulder  pain) and back pain (chronic). Negative for neck pain.  Skin: Negative.   Allergic/Immunologic: Negative.   Neurological: Positive for weakness and headaches (last couple of days). Negative for dizziness and light-headedness.  Hematological: Negative for adenopathy. Does not bruise/bleed easily.  Psychiatric/Behavioral: Positive for sleep disturbance (sleeping interrupted chronically). Negative for dysphoric mood and suicidal ideas. The patient is not nervous/anxious.    Vitals:   07/16/17 0825  BP: (!) 71/51  Pulse: (!) 59  Resp: 18  SpO2: 100%  Weight: (!) 318 lb 4 oz (144.4 kg)  Height: _0  (1.803 m)   Wt Readings from Last 3 Encounters:  07/16/17 (!) 318 lb 4 oz (144.4 kg)  07/07/17 (!) 317 lb (143.8 kg)  05/25/17 (!) 310 lb (140.6 kg)   Lab Results  Component Value Date   CREATININE 0.96 07/07/2017   CREATININE 0.81 03/16/2017   CREATININE 1.29 (H) 02/13/2017   Physical Exam  Constitutional: She is oriented to person, place, and time. She appears well-developed and well-nourished.  HENT:  Head: Normocephalic and atraumatic.  Neck: Normal range of motion. Neck supple. No JVD present.  Cardiovascular: Regular rhythm. Bradycardia present.  Pulmonary/Chest: Effort normal. She has no wheezes. She has no rales.  Abdominal: Soft. She exhibits no distension. There is no tenderness.  Musculoskeletal: She exhibits edema (1+ pitting edema in bilateral lower legs although softer). She exhibits no tenderness.  Neurological: She is alert and oriented to person, place, and time.  Skin: Skin is warm and dry.  Psychiatric: She has a normal mood and affect. Her behavior is normal.  Nursing note and vitals reviewed.  Assessment & Plan:  1: Chronic heart failure with preserved ejection fraction- - NYHA class II - euvolemic today - weighing daily and says that her home weight has declined from 318 pounds to 310 today.  Reminded to call for an overnight weight gain of >2 pounds or a  weekly weight gain of >5 pounds.  - weight stable since she was last here - has not been adding salt to her foods and doesn't think she's eaten anything salty recently - drinking 80-92 ounces of water daily in  addition to a "mug" of coffee every morning. Discussed the importance of keeping her daily fluid intake to closer to 60 ounces daily and to suck on sugar free candy if her mouth remains dry  - saw her cardiologist Rockey Situ) 02/17/17 - diuretic changed to torsemide at her last visit - had to increase her potassium due to hypokalemia - patient reports receiving her flu vaccine for the season already - will get BMP today due to change in diuretic along with hypokalemia  2: HTN- - BP low today even upon recheck (80/50) - will decrease her losartan to 21m. Advised patient to cut the tablet in 1/2 and take 1/2 tablet daily - BMP from 07/07/17 reviewed; sodium 137, potassium 2.8 and GFR >60 - saw PCP (Ancil Boozer 02/05/17   3: Diabetes-  - fasting glucose at home today was 177 - A1c on 01/29/17 was 8.5%  Patient did not bring her medications nor a list. Each medication was verbally reviewed with the patient and she was encouraged to bring the bottles to every visit to confirm accuracy of list.  Return in 2 weeks or sooner for any questions/problems before then.

## 2017-07-16 ENCOUNTER — Encounter: Payer: Self-pay | Admitting: Family

## 2017-07-16 ENCOUNTER — Ambulatory Visit: Payer: Medicare HMO | Attending: Family | Admitting: Family

## 2017-07-16 VITALS — BP 80/50 | HR 59 | Resp 18 | Ht 71.0 in | Wt 318.2 lb

## 2017-07-16 DIAGNOSIS — Z886 Allergy status to analgesic agent status: Secondary | ICD-10-CM | POA: Diagnosis not present

## 2017-07-16 DIAGNOSIS — Z87891 Personal history of nicotine dependence: Secondary | ICD-10-CM | POA: Insufficient documentation

## 2017-07-16 DIAGNOSIS — I509 Heart failure, unspecified: Secondary | ICD-10-CM | POA: Insufficient documentation

## 2017-07-16 DIAGNOSIS — I110 Hypertensive heart disease with heart failure: Secondary | ICD-10-CM | POA: Diagnosis not present

## 2017-07-16 DIAGNOSIS — E119 Type 2 diabetes mellitus without complications: Secondary | ICD-10-CM | POA: Diagnosis not present

## 2017-07-16 DIAGNOSIS — I5032 Chronic diastolic (congestive) heart failure: Secondary | ICD-10-CM

## 2017-07-16 DIAGNOSIS — Z79899 Other long term (current) drug therapy: Secondary | ICD-10-CM | POA: Insufficient documentation

## 2017-07-16 DIAGNOSIS — Z794 Long term (current) use of insulin: Secondary | ICD-10-CM | POA: Diagnosis not present

## 2017-07-16 DIAGNOSIS — R569 Unspecified convulsions: Secondary | ICD-10-CM | POA: Insufficient documentation

## 2017-07-16 DIAGNOSIS — J449 Chronic obstructive pulmonary disease, unspecified: Secondary | ICD-10-CM | POA: Insufficient documentation

## 2017-07-16 DIAGNOSIS — Z9889 Other specified postprocedural states: Secondary | ICD-10-CM | POA: Diagnosis not present

## 2017-07-16 DIAGNOSIS — I10 Essential (primary) hypertension: Secondary | ICD-10-CM

## 2017-07-16 LAB — BASIC METABOLIC PANEL
Anion gap: 11 (ref 5–15)
BUN: 36 mg/dL — AB (ref 6–20)
CHLORIDE: 104 mmol/L (ref 101–111)
CO2: 28 mmol/L (ref 22–32)
CREATININE: 1.16 mg/dL — AB (ref 0.44–1.00)
Calcium: 8.9 mg/dL (ref 8.9–10.3)
GFR calc non Af Amer: 47 mL/min — ABNORMAL LOW (ref >60)
GFR, EST AFRICAN AMERICAN: 54 mL/min — AB (ref >60)
Glucose, Bld: 131 mg/dL — ABNORMAL HIGH (ref 65–99)
POTASSIUM: 4 mmol/L (ref 3.5–5.1)
SODIUM: 143 mmol/L (ref 135–145)

## 2017-07-16 NOTE — Patient Instructions (Addendum)
Continue weighing daily and call for an overnight weight gain of > 2 pounds or a weekly weight gain of >5 pounds.  Decrease losartan to 50mg  daily (cut the pill in 1/2)

## 2017-07-29 ENCOUNTER — Other Ambulatory Visit: Payer: Self-pay | Admitting: Cardiovascular Disease

## 2017-07-29 NOTE — Progress Notes (Signed)
Patient ID: Tina Hayes, female    DOB: [birth year], 72 y.o.   MRN: [id number]  HPI  [name] is a 72 y/o female with a history of seizures, HTN, cardiac stents, DM, depression, CAD, COPD, chronic back pain, anxiety, obesity, remote tobacco use and chronic heart failure.   Last echo was done 06/17/16 and showed an EF of 60-65% along with mild MR and moderate TR. Pulmonary systolic pressure was mildly to moderately elevated. Stress test done 06/18/16.  Was in the ED 03/16/17 due to chest pain where she was treated and released. Admitted 01/29/17 due to atypical chest pain. Cardiology consult was obtained. She was discharged the next day.   She presents today for her follow-up visit with a chief complaint of moderate fatigue upon minimal exertion. She says this has been chronic for many years although does appear to be worsening as her depression is worsening. She has associated shortness of breath, headaches, light-headedness, weakness, chronic back pain, depression and difficulty sleeping. She denies any suicidal thoughts, abdominal distention, palpitations, edema, chest pain or cough. Has been out of her antidepressants for the last 3 days.   Past Medical History:  Diagnosis Date  . (HFpEF) heart failure with preserved ejection fraction ([place])    a. 05/2016 Echo: EF 60-65%, mild to mod LVH, Gr1 DD, mild MR, mildly dil LA, mod TR, mildly to mod increased PASP.  [name] Anxiety   . CHF (congestive heart failure) ([place])   . Chronic back pain   . COPD (chronic obstructive pulmonary disease) ([place])   . Coronary artery disease    a. s/p remote PCI x 5;  b. 2006 s/p CABG x 3 ([place]); b. 05/2016 MV: attenuation corrected images w/o ischemia or wma-->Med rx.  . Depression   . Diabetes mellitus without complication ([place])   . Heart attack ([place])    Total of 3 per pt.  . Hypertension   . Seizures ([place])    Past Surgical History:  Procedure Laterality Date  . ANKLE SURGERY     . COLONOSCOPY WITH PROPOFOL N/A 05/04/2017   Procedure: COLONOSCOPY WITH PROPOFOL;  Surgeon: [name], MD;  [facility];  Service: Endoscopy;  Laterality: N/A;  . CORONARY ANGIOPLASTY    . CORONARY ARTERY BYPASS GRAFT    . ESOPHAGOGASTRODUODENOSCOPY (EGD) WITH PROPOFOL N/A 05/04/2017   Procedure: ESOPHAGOGASTRODUODENOSCOPY (EGD) WITH PROPOFOL;  Surgeon: [name], MD;  Location: [facility] ENDOSCOPY;  Service: Endoscopy;  Laterality: N/A;  . FRACTURE SURGERY    . KNEE SURGERY    . MOUTH SURGERY Left 07/14/2017   Family History  Problem Relation Age of Onset  . Diabetes Unknown   . Hypertension Unknown   . Diabetes Mother   . Heart failure Mother   . Heart disease Mother   . Heart attack Mother   . Stroke Mother   . Depression Mother   . Hypertension Mother   . Cancer Sister        brain  . Hypertension Sister   . Diabetes Brother   . Hypertension Brother   . Heart failure Sister   . Heart attack Sister   . SIDS Sister    Social History   Tobacco Use  . Smoking status: Former Smoker    Packs/day: 0.25    Years: 20.00    Pack years: 5.00    Types: Cigarettes    Last attempt to quit: 07/01/2006    Years since quitting: 11.0  .  Smokeless tobacco: Never Used  Substance Use Topics  . Alcohol use: No    Alcohol/week: 0.0 oz   Allergies  Allergen Reactions  . Aspirin Anaphylaxis   Prior to Admission medications   Medication Sig Start Date End Date Taking? Authorizing Provider  ACCU-CHEK AVIVA PLUS test strip  05/19/16  Yes [provider]  albuterol (PROVENTIL HFA;VENTOLIN HFA) 108 (90 Base) MCG/ACT inhaler Inhale 2 puffs into the lungs every 6 (six) hours as needed for wheezing or shortness of breath. 08/30/15  Yes Epifanio Lesches, MD  albuterol (PROVENTIL) (2.5 MG/3ML) 0.083% nebulizer solution Take 2.5 mg every 4 (four) hours as needed by nebulization for wheezing or shortness of breath.   Yes [provider]  atorvastatin  (LIPITOR) 40 MG tablet Take 40 mg at bedtime by mouth.    Yes [provider]  Blood Glucose Monitoring Suppl (ACCU-CHEK AVIVA PLUS) w/Device KIT  06/13/16  Yes [provider]  Calcium Carbonate-Vitamin D3 (CALCIUM 600-D) 600-400 MG-UNIT TABS Take 1 tablet 2 (two) times daily by mouth.   Yes [provider]  carvedilol (COREG) 3.125 MG tablet Take 1 tablet (3.125 mg total) by mouth 2 (two) times daily with a meal. 08/12/16  Yes Jarielys Girardot A, FNP  cetirizine (ZYRTEC) 10 MG tablet Take 10 mg by mouth daily.   Yes [provider]  clopidogrel (PLAVIX) 75 MG tablet Take 1 tablet (75 mg total) by mouth daily. 08/14/16  Yes Minna Merritts, MD  dextromethorphan (DELSYM) 30 MG/5ML liquid Take 15 mg every 12 (twelve) hours as needed by mouth for cough.   Yes [provider]  esomeprazole (NEXIUM) 40 MG capsule Take 40 mg daily at 12 noon by mouth.   Yes [provider]  ferrous sulfate 325 (65 FE) MG tablet Take 325 mg 3 (three) times daily with meals by mouth.   Yes [provider]  insulin aspart (NOVOLOG) 100 UNIT/ML injection Inject 5-10 Units 2 (two) times daily with a meal into the skin. Take 10 units in the morning and 5 units in the evening with meal   Yes [provider]  insulin glargine (LANTUS) 100 UNIT/ML injection Inject 30-40 Units 2 (two) times daily into the skin. Inject 30 units in the morning at 40 units at bedtime.   Yes [provider]  isosorbide mononitrate (IMDUR) 30 MG 24 hr tablet Take 1 tablet (30 mg total) by mouth 2 (two) times daily. 08/12/16  Yes Darylene Price A, FNP  levETIRAcetam (KEPPRA) 750 MG tablet Take 1 tablet (750 mg total) by mouth 2 (two) times daily. 06/05/15  Yes Rai, Ripudeep K, MD  losartan (COZAAR) 100 MG tablet Take 1 tablet (100 mg total) by mouth daily. Patient taking differently: Take 50 mg by mouth daily.  08/12/16  Yes Angeleen Horney A, FNP  meloxicam (MOBIC) 7.5 MG tablet Take  7.5 mg daily by mouth.   Yes [provider]  nitroGLYCERIN (NITROSTAT) 0.4 MG SL tablet DISSOLVE ONE TABLET UNDER THE TONGUE EVERY 5 MINUTES AS NEEDED FOR CHEST PAIN.  DO NOT EXCEED A TOTAL OF 3 DOSES IN 15 MINUTES 07/30/17  Yes Gollan, Kathlene November, MD  polyethylene glycol-electrolytes (NULYTELY/GOLYTELY) 420 g solution Take 4,000 mLs by mouth once.   Yes [provider]  potassium chloride SA (K-DUR,KLOR-CON) 20 MEQ tablet Take 20 mEq daily by mouth.    Yes [provider]  potassium chloride SA (K-DUR,KLOR-CON) 20 MEQ tablet Take 2 tablets (40 mEq total) by mouth 3 (  three) times daily. 07/07/17 10/05/17 Yes Cynthis Purington, Otila Kluver A, FNP  tiotropium (SPIRIVA) 18 MCG inhalation capsule Place 18 mcg into inhaler and inhale daily.   Yes [provider]  torsemide (DEMADEX) 20 MG tablet Take 2 tablets (40 mg total) by mouth 2 (two) times daily. 07/07/17 10/05/17 Yes Lenard Kampf, Aura Fey, FNP  traMADol (ULTRAM) 50 MG tablet Take 2 tablets (100 mg total) by mouth 4 (four) times daily. 04/24/17  Yes Molli Barrows, MD  traZODone (DESYREL) 50 MG tablet Take 50 mg by mouth at bedtime.   Yes [provider]  UNIFINE PENTIPS 31G X 6 MM [place]  07/23/16  Yes [provider]  citalopram (CELEXA) 20 MG tablet Take 20 mg by mouth daily.    [provider]    Review of Systems  Constitutional: Positive for fatigue. Negative for appetite change.  HENT: Positive for trouble swallowing ("chronic"). Negative for congestion, postnasal drip and sore throat.   Eyes: Negative.   Respiratory: Positive for shortness of breath ("doing good"). Negative for cough and chest tightness.   Cardiovascular: Negative for chest pain, palpitations and leg swelling.  Gastrointestinal: Negative for abdominal distention, abdominal pain and nausea.  Endocrine: Negative.   Genitourinary: Negative.   Musculoskeletal: Positive for arthralgias (left shoulder pain) and back pain (chronic). Negative  for neck pain.  Skin: Negative.   Allergic/Immunologic: Negative.   Neurological: Positive for weakness, light-headedness and headaches. Negative for dizziness.  Hematological: Negative for adenopathy. Does not bruise/bleed easily.  Psychiatric/Behavioral: Positive for dysphoric mood and sleep disturbance (not sleeping due to being out antidepressants). Negative for suicidal ideas. The patient is not nervous/anxious.    Vitals:   07/30/17 0830 07/30/17 0831  BP:  (!) 80/50  Pulse: (!) 58   Resp: 18   SpO2: 98%   Weight: (!) 320 lb (145.2 kg)   Height: '5\' 11"'  (1.803 m)    Wt Readings from Last 3 Encounters:  07/30/17 (!) 320 lb (145.2 kg)  07/16/17 (!) 318 lb 4 oz (144.4 kg)  07/07/17 (!) 317 lb (143.8 kg)   Lab Results  Component Value Date   CREATININE 1.16 (H) 07/16/2017   CREATININE 0.96 07/07/2017   CREATININE 0.81 03/16/2017    Physical Exam  Constitutional: She is oriented to person, place, and time. She appears well-developed and well-nourished.  HENT:  Head: Normocephalic and atraumatic.  Neck: Normal range of motion. Neck supple. No JVD present.  Cardiovascular: Regular rhythm. Bradycardia present.  Pulmonary/Chest: Effort normal. She has no wheezes. She has no rales.  Abdominal: Soft. She exhibits no distension. There is no tenderness.  Musculoskeletal: She exhibits no edema or tenderness.  Neurological: She is alert and oriented to person, place, and time.  Skin: Skin is warm and dry.  Psychiatric: Her behavior is normal. She exhibits a depressed mood.  Tearful at times  Nursing note and vitals reviewed.  Assessment & Plan:  1: Chronic heart failure with preserved ejection fraction- - NYHA class II - euvolemic today - weighing daily and says that her home weight has been stable.  Reminded to call for an overnight weight gain of >2 pounds or a weekly weight gain of >5 pounds.  - weight up 2 pounds since she was last here - has not been adding salt to her  foods and doesn't think she's eaten anything salty recently - trying to keep fluid intake closer to 60 ounces daily but admits that it's difficult to do - saw her cardiologist Rockey Situ) 02/17/17 -  endorses weakness in legs; agreeable to PT evaluation with home health  2: HTN- - BP remains low even with the reduction of losartan last week  - will stop her losartan today - BMP from 07/16/17 reviewed; sodium 143, potassium 4.0 and GFR 54 - saw PCP ([place]) 02/05/17   3: Diabetes-  - fasting glucose at home today was 138 - A1c on 01/29/17 was 8.5%  4: Depression- - patient has been out of her antidepressant for the last 3 days due to issues with mail order company - tearful at times as she says that she feels taken advantage of; denies concerns about issues with her safety at home - has seen a counselor in the past at PCP's office and she says that she's going to see about resuming care with them. She is also going to stop by PCP's office to see about her sending in a RX to her local pharmacy.  - denies suicidal thoughts - says that she occasionally gets confused about her medications and whether she's taken them or not. Agreeable to home health referral for assistance with medications as well as BP monitoring  Patient did not bring her medications nor a list. Each medication was verbally reviewed with the patient and she was encouraged to bring the bottles to every visit to confirm accuracy of list.  Return in 2 weeks or sooner for any questions/problems before then.

## 2017-07-30 ENCOUNTER — Ambulatory Visit: Payer: Medicare HMO | Attending: Family | Admitting: Family

## 2017-07-30 ENCOUNTER — Encounter: Payer: Self-pay | Admitting: Family

## 2017-07-30 VITALS — BP 80/50 | HR 58 | Resp 18 | Ht 71.0 in | Wt 320.0 lb

## 2017-07-30 DIAGNOSIS — I2510 Atherosclerotic heart disease of native coronary artery without angina pectoris: Secondary | ICD-10-CM | POA: Insufficient documentation

## 2017-07-30 DIAGNOSIS — I110 Hypertensive heart disease with heart failure: Secondary | ICD-10-CM | POA: Insufficient documentation

## 2017-07-30 DIAGNOSIS — Z6841 Body Mass Index (BMI) 40.0 and over, adult: Secondary | ICD-10-CM | POA: Insufficient documentation

## 2017-07-30 DIAGNOSIS — I252 Old myocardial infarction: Secondary | ICD-10-CM | POA: Diagnosis not present

## 2017-07-30 DIAGNOSIS — R569 Unspecified convulsions: Secondary | ICD-10-CM | POA: Diagnosis not present

## 2017-07-30 DIAGNOSIS — Z951 Presence of aortocoronary bypass graft: Secondary | ICD-10-CM | POA: Insufficient documentation

## 2017-07-30 DIAGNOSIS — G8929 Other chronic pain: Secondary | ICD-10-CM | POA: Insufficient documentation

## 2017-07-30 DIAGNOSIS — E669 Obesity, unspecified: Secondary | ICD-10-CM | POA: Insufficient documentation

## 2017-07-30 DIAGNOSIS — Z8249 Family history of ischemic heart disease and other diseases of the circulatory system: Secondary | ICD-10-CM | POA: Diagnosis not present

## 2017-07-30 DIAGNOSIS — F329 Major depressive disorder, single episode, unspecified: Secondary | ICD-10-CM | POA: Diagnosis not present

## 2017-07-30 DIAGNOSIS — F419 Anxiety disorder, unspecified: Secondary | ICD-10-CM | POA: Insufficient documentation

## 2017-07-30 DIAGNOSIS — Z955 Presence of coronary angioplasty implant and graft: Secondary | ICD-10-CM | POA: Insufficient documentation

## 2017-07-30 DIAGNOSIS — R531 Weakness: Secondary | ICD-10-CM | POA: Insufficient documentation

## 2017-07-30 DIAGNOSIS — Z823 Family history of stroke: Secondary | ICD-10-CM | POA: Insufficient documentation

## 2017-07-30 DIAGNOSIS — Z79899 Other long term (current) drug therapy: Secondary | ICD-10-CM | POA: Diagnosis not present

## 2017-07-30 DIAGNOSIS — E119 Type 2 diabetes mellitus without complications: Secondary | ICD-10-CM | POA: Diagnosis not present

## 2017-07-30 DIAGNOSIS — I5032 Chronic diastolic (congestive) heart failure: Secondary | ICD-10-CM | POA: Diagnosis present

## 2017-07-30 DIAGNOSIS — Z87891 Personal history of nicotine dependence: Secondary | ICD-10-CM | POA: Insufficient documentation

## 2017-07-30 DIAGNOSIS — Z794 Long term (current) use of insulin: Secondary | ICD-10-CM | POA: Diagnosis not present

## 2017-07-30 DIAGNOSIS — Z886 Allergy status to analgesic agent status: Secondary | ICD-10-CM | POA: Insufficient documentation

## 2017-07-30 DIAGNOSIS — Z7902 Long term (current) use of antithrombotics/antiplatelets: Secondary | ICD-10-CM | POA: Diagnosis not present

## 2017-07-30 DIAGNOSIS — Z791 Long term (current) use of non-steroidal anti-inflammatories (NSAID): Secondary | ICD-10-CM | POA: Diagnosis not present

## 2017-07-30 DIAGNOSIS — M549 Dorsalgia, unspecified: Secondary | ICD-10-CM | POA: Insufficient documentation

## 2017-07-30 DIAGNOSIS — Z808 Family history of malignant neoplasm of other organs or systems: Secondary | ICD-10-CM | POA: Insufficient documentation

## 2017-07-30 DIAGNOSIS — I10 Essential (primary) hypertension: Secondary | ICD-10-CM

## 2017-07-30 DIAGNOSIS — J449 Chronic obstructive pulmonary disease, unspecified: Secondary | ICD-10-CM | POA: Insufficient documentation

## 2017-07-30 DIAGNOSIS — Z833 Family history of diabetes mellitus: Secondary | ICD-10-CM | POA: Insufficient documentation

## 2017-07-30 NOTE — Patient Instructions (Addendum)
Continue weighing daily and call for an overnight weight gain of > 2 pounds or a weekly weight gain of >5 pounds.  Stop taking the losartan due to low blood pressure.

## 2017-07-31 ENCOUNTER — Other Ambulatory Visit: Payer: Self-pay

## 2017-07-31 ENCOUNTER — Other Ambulatory Visit: Payer: Self-pay | Admitting: Registered Nurse

## 2017-07-31 DIAGNOSIS — L729 Follicular cyst of the skin and subcutaneous tissue, unspecified: Secondary | ICD-10-CM

## 2017-07-31 DIAGNOSIS — I5031 Acute diastolic (congestive) heart failure: Secondary | ICD-10-CM

## 2017-08-04 ENCOUNTER — Telehealth: Payer: Self-pay | Admitting: Cardiovascular Disease

## 2017-08-04 NOTE — Telephone Encounter (Signed)
New Message  Lemaneul verbalized from Tina Hayes pt was referred for hh nurse tried to setup & schedule with patient for therapy and pt refused therapy verbalized she does not need it but agreed for hh nurse.  Please f/u

## 2017-08-04 NOTE — Telephone Encounter (Addendum)
S/w Lem at [facility]. Patient refused to have therapy but agreed to the home health nurse to come out. Patient told therapist she did not need therapy. Looks like this was ordered through the heart failure clinic. Will route to [facility] for further advice and notification.

## 2017-08-07 NOTE — Progress Notes (Signed)
Patient ID: Tina Hayes, female    DOB: 1946-03-03, 72 y.o.   MRN: [id number]  HPI  Ms Shinall is a 72 y/o female with a history of seizures, HTN, cardiac stents, DM, depression, CAD, COPD, chronic back pain, anxiety, obesity, remote tobacco use and chronic heart failure.   Last echo was done 06/17/16 and showed an EF of 60-65% along with mild MR and moderate TR. Pulmonary systolic pressure was mildly to moderately elevated. Stress test done 06/18/16.  Was in the ED 03/16/17 due to chest pain where she was treated and released. Admitted 01/29/17 due to atypical chest pain. Cardiology consult was obtained. She was discharged the next day.   She presents today for her follow-up visit with a chief complaint of moderate shortness of breath upon minimal exertion. She says this has been chronic in nature having been present for several years although she feels like it's a little worse since she's been out of her antidepressant. She has associated fatigue, intermittent chest pain, trouble swallowing (again), chronic back pain, shoulder pain, light-headedness, headache, leg weakness, depression and difficulty sleeping. She denies abdominal distention, palpitations, edema, cough or weight gain.   Past Medical History:  Diagnosis Date  . (HFpEF) heart failure with preserved ejection fraction ([place])    a. 05/2016 Echo: EF 60-65%, mild to mod LVH, Gr1 DD, mild MR, mildly dil LA, mod TR, mildly to mod increased PASP.  [name] Anxiety   . CHF (congestive heart failure) ([place])   . Chronic back pain   . COPD (chronic obstructive pulmonary disease) ([place])   . Coronary artery disease    a. s/p remote PCI x 5;  b. 2006 s/p CABG x 3 ([place]); b. 05/2016 MV: attenuation corrected images w/o ischemia or wma-->Med rx.  . Depression   . Diabetes mellitus without complication ([place])   . Heart attack ([place])    Total of 3 per pt.  . Hypertension   . Seizures ([place])    Past Surgical History:   Procedure Laterality Date  . ANKLE SURGERY    . COLONOSCOPY WITH PROPOFOL N/A 05/04/2017   Procedure: COLONOSCOPY WITH PROPOFOL;  Surgeon: [name], MD;  Location: [facility] ENDOSCOPY;  Service: Endoscopy;  Laterality: N/A;  . CORONARY ANGIOPLASTY    . CORONARY ARTERY BYPASS GRAFT    . ESOPHAGOGASTRODUODENOSCOPY (EGD) WITH PROPOFOL N/A 05/04/2017   Procedure: ESOPHAGOGASTRODUODENOSCOPY (EGD) WITH PROPOFOL;  Surgeon: [name], MD;  Location: [facility] ENDOSCOPY;  Service: Endoscopy;  Laterality: N/A;  . FRACTURE SURGERY    . KNEE SURGERY    . MOUTH SURGERY Left 07/14/2017   Family History  Problem Relation Age of Onset  . Diabetes Unknown   . Hypertension Unknown   . Diabetes Mother   . Heart failure Mother   . Heart disease Mother   . Heart attack Mother   . Stroke Mother   . Depression Mother   . Hypertension Mother   . Cancer Sister        brain  . Hypertension Sister   . Diabetes Brother   . Hypertension Brother   . Heart failure Sister   . Heart attack Sister   . SIDS Sister    Social History   Tobacco Use  . Smoking status: Former Smoker    Packs/day: 0.25    Years: 20.00    Pack years: 5.00    Types: Cigarettes    Last attempt to quit: 07/01/2006    Years  since quitting: 11.1  . Smokeless tobacco: Never Used  Substance Use Topics  . Alcohol use: No    Alcohol/week: 0.0 oz   Allergies  Allergen Reactions  . Aspirin Anaphylaxis   Prior to Admission medications   Medication Sig Start Date End Date Taking? Authorizing Provider  ACCU-CHEK AVIVA PLUS test strip  05/19/16  Yes [provider]  albuterol (PROVENTIL HFA;VENTOLIN HFA) 108 (90 Base) MCG/ACT inhaler Inhale 2 puffs into the lungs every 6 (six) hours as needed for wheezing or shortness of breath. 08/30/15  Yes Epifanio Lesches, MD  albuterol (PROVENTIL) (2.5 MG/3ML) 0.083% nebulizer solution Take 2.5 mg every 4 (four) hours as needed by nebulization for wheezing or shortness of breath.    Yes [provider]  atorvastatin (LIPITOR) 40 MG tablet Take 40 mg at bedtime by mouth.    Yes [provider]  Blood Glucose Monitoring Suppl (ACCU-CHEK AVIVA PLUS) w/Device KIT  06/13/16  Yes [provider]  Calcium Carbonate-Vitamin D3 (CALCIUM 600-D) 600-400 MG-UNIT TABS Take 1 tablet 2 (two) times daily by mouth.   Yes [provider]  carvedilol (COREG) 3.125 MG tablet Take 1 tablet (3.125 mg total) by mouth 2 (two) times daily with a meal. 08/12/16  Yes Jamond Neels A, FNP  cetirizine (ZYRTEC) 10 MG tablet Take 10 mg by mouth daily.   Yes [provider]  clopidogrel (PLAVIX) 75 MG tablet Take 1 tablet (75 mg total) by mouth daily. 08/14/16  Yes Minna Merritts, MD  dextromethorphan (DELSYM) 30 MG/5ML liquid Take 15 mg every 12 (twelve) hours as needed by mouth for cough.   Yes [provider]  esomeprazole (NEXIUM) 40 MG capsule Take 40 mg daily at 12 noon by mouth.   Yes [provider]  ferrous sulfate 325 (65 FE) MG tablet Take 325 mg 3 (three) times daily with meals by mouth.   Yes [provider]  insulin aspart (NOVOLOG) 100 UNIT/ML injection Inject 5-10 Units 2 (two) times daily with a meal into the skin. Take 10 units in the morning and 5 units in the evening with meal   Yes [provider]  insulin glargine (LANTUS) 100 UNIT/ML injection Inject 30-40 Units 2 (two) times daily into the skin. Inject 30 units in the morning at 40 units at bedtime.   Yes [provider]  isosorbide mononitrate (IMDUR) 30 MG 24 hr tablet Take 1 tablet (30 mg total) by mouth 2 (two) times daily. 08/12/16  Yes Darylene Price A, FNP  levETIRAcetam (KEPPRA) 750 MG tablet Take 1 tablet (750 mg total) by mouth 2 (two) times daily. 06/05/15  Yes Rai, Ripudeep K, MD  meloxicam (MOBIC) 7.5 MG tablet Take 7.5 mg daily by mouth.   Yes [provider]  nitroGLYCERIN (NITROSTAT) 0.4 MG SL tablet DISSOLVE ONE TABLET UNDER  THE TONGUE EVERY 5 MINUTES AS NEEDED FOR CHEST PAIN.  DO NOT EXCEED A TOTAL OF 3 DOSES IN 15 MINUTES 07/30/17  Yes Gollan, Kathlene November, MD  polyethylene glycol-electrolytes (NULYTELY/GOLYTELY) 420 g solution Take 4,000 mLs by mouth once.   Yes [provider]  potassium chloride SA (K-DUR,KLOR-CON) 20 MEQ tablet Take 2 tablets (40 mEq total) by mouth 3 (three) times daily. 07/07/17 10/05/17 Yes Noorah Giammona, Otila Kluver A, FNP  tiotropium (SPIRIVA) 18 MCG inhalation capsule Place 18 mcg into inhaler and inhale daily.   Yes [provider]  torsemide (DEMADEX) 20 MG tablet Take 2 tablets (40 mg total) by mouth 2 (two) times  daily. 07/07/17 10/05/17 Yes Geonna Lockyer, Aura Fey, FNP  traMADol (ULTRAM) 50 MG tablet Take 2 tablets (100 mg total) by mouth 4 (four) times daily. 04/24/17  Yes Molli Barrows, MD  traZODone (DESYREL) 50 MG tablet Take 50 mg by mouth at bedtime.   Yes [provider]  UNIFINE PENTIPS 31G X 6 MM [place]  07/23/16  Yes [provider]  citalopram (CELEXA) 20 MG tablet Take 20 mg by mouth daily.    [provider]    Review of Systems  Constitutional: Positive for fatigue. Negative for appetite change.  HENT: Positive for trouble swallowing ("chronic"). Negative for congestion, postnasal drip and sore throat.   Eyes: Negative.   Respiratory: Positive for shortness of breath. Negative for cough and chest tightness.   Cardiovascular: Positive for chest pain (at times). Negative for palpitations and leg swelling.  Gastrointestinal: Positive for vomiting (at times). Negative for abdominal distention, abdominal pain and nausea.  Endocrine: Negative.   Genitourinary: Negative.   Musculoskeletal: Positive for arthralgias (left shoulder pain) and back pain (chronic). Negative for neck pain.  Skin: Negative.   Allergic/Immunologic: Negative.   Neurological: Positive for weakness, light-headedness and headaches. Negative for dizziness.  Hematological: Negative for  adenopathy. Does not bruise/bleed easily.  Psychiatric/Behavioral: Positive for dysphoric mood and sleep disturbance (not sleeping due to being out antidepressants). Negative for suicidal ideas. The patient is not nervous/anxious.    Vitals:   08/10/17 0851  Pulse: (!) 59  Resp: 18  SpO2: 100%  Weight: (!) 322 lb 8 oz (146.3 kg)  Height: '5\' 11"'  (1.803 m)   Wt Readings from Last 3 Encounters:  08/10/17 (!) 322 lb 8 oz (146.3 kg)  07/30/17 (!) 320 lb (145.2 kg)  07/16/17 (!) 318 lb 4 oz (144.4 kg)   Lab Results  Component Value Date   CREATININE 1.16 (H) 07/16/2017   CREATININE 0.96 07/07/2017   CREATININE 0.81 03/16/2017    Physical Exam  Constitutional: She is oriented to person, place, and time. She appears well-developed and well-nourished.  HENT:  Head: Normocephalic and atraumatic.  Neck: Normal range of motion. Neck supple. No JVD present.  Cardiovascular: Regular rhythm. Bradycardia present.  Pulmonary/Chest: Effort normal. She has no wheezes. She has no rales.  Abdominal: Soft. She exhibits no distension. There is no tenderness.  Musculoskeletal: She exhibits no edema or tenderness.  Neurological: She is alert and oriented to person, place, and time.  Skin: Skin is warm and dry.  Psychiatric: Her behavior is normal. She exhibits a depressed mood.  Tearful at times  Nursing note and vitals reviewed.  Assessment & Plan:  1: Chronic heart failure with preserved ejection fraction- - NYHA class III - euvolemic today - weighing daily and says that her home weight has been stable.  Reminded to call for an overnight weight gain of >2 pounds or a weekly weight gain of >5 pounds.  - weight up 2 pounds since she was last here - has not been adding salt to her foods and doesn't think she's eaten anything salty recently - trying to keep fluid intake closer to 60 ounces daily but admits that it's difficult to do - saw her cardiologist Rockey Situ) 02/17/17  2: HTN- - BP  elevated today after stopping losartan at her last visit - resume losartan 18m (1/2 tablet) daily - will check labs at her next visit since resuming medication - BMP from 07/16/17 reviewed; sodium 143, potassium 4.0 and GFR 54 - saw PCP (Sowles) 02/05/17  3: Diabetes-  - fasting glucose at home today was 172 - A1c on 01/29/17 was 8.5%  4: Depression- - patient has been out of her antidepressant for the last couple of weeks due to issues with mail order [place] says they have mailed it but she says that she hasn't received it. [facility] will hopefully be able to fill it today - she says that she's looking to have her prescriptions all sent to a local pharmacy that delivers - denies suicidal thoughts - had 1 visit from home health but says that, right now, she would prefer them not to continue. She says her daughter-in-law doesn't like someone else coming into the home. She says that if she's able to get her own place, she would like to resume home health at that time  5: Dysphagia- - patient says that she's having difficulty swallowing again - number given to [facility] for her to call the GI doctors - had endoscopy done January 2019  Patient did not bring her medications nor a list. Each medication was verbally reviewed with the patient and she was encouraged to bring the bottles to every visit to confirm accuracy of list.  Return in 1 month or sooner for any questions/problems before then.

## 2017-08-10 ENCOUNTER — Encounter: Payer: Self-pay | Admitting: Family

## 2017-08-10 ENCOUNTER — Ambulatory Visit: Payer: Medicare HMO | Attending: Family | Admitting: Family

## 2017-08-10 VITALS — BP 159/101 | HR 59 | Resp 18 | Ht 71.0 in | Wt 322.5 lb

## 2017-08-10 DIAGNOSIS — I110 Hypertensive heart disease with heart failure: Secondary | ICD-10-CM | POA: Insufficient documentation

## 2017-08-10 DIAGNOSIS — I2510 Atherosclerotic heart disease of native coronary artery without angina pectoris: Secondary | ICD-10-CM | POA: Insufficient documentation

## 2017-08-10 DIAGNOSIS — I5032 Chronic diastolic (congestive) heart failure: Secondary | ICD-10-CM

## 2017-08-10 DIAGNOSIS — J449 Chronic obstructive pulmonary disease, unspecified: Secondary | ICD-10-CM | POA: Insufficient documentation

## 2017-08-10 DIAGNOSIS — F329 Major depressive disorder, single episode, unspecified: Secondary | ICD-10-CM | POA: Insufficient documentation

## 2017-08-10 DIAGNOSIS — I509 Heart failure, unspecified: Secondary | ICD-10-CM | POA: Insufficient documentation

## 2017-08-10 DIAGNOSIS — Z79899 Other long term (current) drug therapy: Secondary | ICD-10-CM | POA: Insufficient documentation

## 2017-08-10 DIAGNOSIS — Z87891 Personal history of nicotine dependence: Secondary | ICD-10-CM | POA: Insufficient documentation

## 2017-08-10 DIAGNOSIS — I252 Old myocardial infarction: Secondary | ICD-10-CM | POA: Insufficient documentation

## 2017-08-10 DIAGNOSIS — R569 Unspecified convulsions: Secondary | ICD-10-CM | POA: Insufficient documentation

## 2017-08-10 DIAGNOSIS — M549 Dorsalgia, unspecified: Secondary | ICD-10-CM | POA: Insufficient documentation

## 2017-08-10 DIAGNOSIS — Z955 Presence of coronary angioplasty implant and graft: Secondary | ICD-10-CM | POA: Diagnosis not present

## 2017-08-10 DIAGNOSIS — Z794 Long term (current) use of insulin: Secondary | ICD-10-CM | POA: Diagnosis not present

## 2017-08-10 DIAGNOSIS — Z8249 Family history of ischemic heart disease and other diseases of the circulatory system: Secondary | ICD-10-CM | POA: Diagnosis not present

## 2017-08-10 DIAGNOSIS — I10 Essential (primary) hypertension: Secondary | ICD-10-CM

## 2017-08-10 DIAGNOSIS — Z951 Presence of aortocoronary bypass graft: Secondary | ICD-10-CM | POA: Diagnosis not present

## 2017-08-10 DIAGNOSIS — F419 Anxiety disorder, unspecified: Secondary | ICD-10-CM | POA: Diagnosis not present

## 2017-08-10 DIAGNOSIS — G8929 Other chronic pain: Secondary | ICD-10-CM | POA: Diagnosis not present

## 2017-08-10 DIAGNOSIS — E119 Type 2 diabetes mellitus without complications: Secondary | ICD-10-CM

## 2017-08-10 DIAGNOSIS — E669 Obesity, unspecified: Secondary | ICD-10-CM | POA: Diagnosis not present

## 2017-08-10 DIAGNOSIS — R1310 Dysphagia, unspecified: Secondary | ICD-10-CM | POA: Insufficient documentation

## 2017-08-10 NOTE — Patient Instructions (Addendum)
Continue weighing daily and call for an overnight weight gain of > 2 pounds or a weekly weight gain of >5 pounds.  Call [facility] at [phone] and ask for GI department  Resume taking losartan at 1/2 tablet daily (50mg  daily)

## 2017-08-15 ENCOUNTER — Observation Stay
Admission: EM | Admit: 2017-08-15 | Discharge: 2017-08-17 | Disposition: A | Discharge status: Home / Self Care | Payer: Medicare HMO | Attending: Internal Medicine | Admitting: Internal Medicine

## 2017-08-15 ENCOUNTER — Encounter: Payer: Self-pay | Admitting: Emergency Medicine

## 2017-08-15 ENCOUNTER — Emergency Department: Payer: Medicare HMO

## 2017-08-15 ENCOUNTER — Other Ambulatory Visit: Payer: Self-pay

## 2017-08-15 DIAGNOSIS — F419 Anxiety disorder, unspecified: Secondary | ICD-10-CM | POA: Diagnosis not present

## 2017-08-15 DIAGNOSIS — F329 Major depressive disorder, single episode, unspecified: Secondary | ICD-10-CM | POA: Diagnosis not present

## 2017-08-15 DIAGNOSIS — I25118 Atherosclerotic heart disease of native coronary artery with other forms of angina pectoris: Secondary | ICD-10-CM | POA: Insufficient documentation

## 2017-08-15 DIAGNOSIS — Z87891 Personal history of nicotine dependence: Secondary | ICD-10-CM | POA: Diagnosis not present

## 2017-08-15 DIAGNOSIS — R0602 Shortness of breath: Secondary | ICD-10-CM

## 2017-08-15 DIAGNOSIS — I252 Old myocardial infarction: Secondary | ICD-10-CM | POA: Diagnosis not present

## 2017-08-15 DIAGNOSIS — Z886 Allergy status to analgesic agent status: Secondary | ICD-10-CM | POA: Insufficient documentation

## 2017-08-15 DIAGNOSIS — Z794 Long term (current) use of insulin: Secondary | ICD-10-CM | POA: Diagnosis not present

## 2017-08-15 DIAGNOSIS — Z79899 Other long term (current) drug therapy: Secondary | ICD-10-CM | POA: Diagnosis not present

## 2017-08-15 DIAGNOSIS — I2510 Atherosclerotic heart disease of native coronary artery without angina pectoris: Secondary | ICD-10-CM | POA: Diagnosis present

## 2017-08-15 DIAGNOSIS — Z66 Do not resuscitate: Secondary | ICD-10-CM | POA: Insufficient documentation

## 2017-08-15 DIAGNOSIS — E119 Type 2 diabetes mellitus without complications: Secondary | ICD-10-CM | POA: Insufficient documentation

## 2017-08-15 DIAGNOSIS — I5032 Chronic diastolic (congestive) heart failure: Secondary | ICD-10-CM | POA: Diagnosis not present

## 2017-08-15 DIAGNOSIS — J441 Chronic obstructive pulmonary disease with (acute) exacerbation: Principal | ICD-10-CM | POA: Insufficient documentation

## 2017-08-15 DIAGNOSIS — Z951 Presence of aortocoronary bypass graft: Secondary | ICD-10-CM | POA: Insufficient documentation

## 2017-08-15 DIAGNOSIS — I110 Hypertensive heart disease with heart failure: Secondary | ICD-10-CM | POA: Diagnosis not present

## 2017-08-15 DIAGNOSIS — J810 Acute pulmonary edema: Secondary | ICD-10-CM

## 2017-08-15 DIAGNOSIS — I10 Essential (primary) hypertension: Secondary | ICD-10-CM | POA: Diagnosis present

## 2017-08-15 LAB — CBC WITH DIFFERENTIAL/PLATELET
BASOS PCT: 1 %
Basophils Absolute: 0.1 10*3/uL (ref 0–0.1)
Eosinophils Absolute: 0.5 10*3/uL (ref 0–0.7)
Eosinophils Relative: 7 %
HEMATOCRIT: 29.4 % — AB (ref 35.0–47.0)
HEMOGLOBIN: 9.7 g/dL — AB (ref 12.0–16.0)
LYMPHS PCT: 26 %
Lymphs Abs: 1.8 10*3/uL (ref 1.0–3.6)
MCH: 26.6 pg (ref 26.0–34.0)
MCHC: 33.1 g/dL (ref 32.0–36.0)
MCV: 80.2 fL (ref 80.0–100.0)
Monocytes Absolute: 0.5 10*3/uL (ref 0.2–0.9)
Monocytes Relative: 7 %
NEUTROS ABS: 4.1 10*3/uL (ref 1.4–6.5)
NEUTROS PCT: 59 %
Platelets: 235 10*3/uL (ref 150–440)
RBC: 3.66 MIL/uL — AB (ref 3.80–5.20)
RDW: 17.4 % — ABNORMAL HIGH (ref 11.5–14.5)
WBC: 6.8 10*3/uL (ref 3.6–11.0)

## 2017-08-15 LAB — BASIC METABOLIC PANEL
ANION GAP: 5 (ref 5–15)
BUN: 17 mg/dL (ref 6–20)
CALCIUM: 8.5 mg/dL — AB (ref 8.9–10.3)
CHLORIDE: 103 mmol/L (ref 101–111)
CO2: 32 mmol/L (ref 22–32)
Creatinine, Ser: 1 mg/dL (ref 0.44–1.00)
GFR calc non Af Amer: 56 mL/min — ABNORMAL LOW (ref >60)
Glucose, Bld: 140 mg/dL — ABNORMAL HIGH (ref 65–99)
POTASSIUM: 3 mmol/L — AB (ref 3.5–5.1)
Sodium: 140 mmol/L (ref 135–145)

## 2017-08-15 LAB — TROPONIN I: Troponin I: <0.03 ng/mL (ref <0.03)

## 2017-08-15 MED ORDER — POTASSIUM CHLORIDE CRYS ER 20 MEQ PO TBCR
40.0000 meq | EXTENDED_RELEASE_TABLET | Freq: Once | ORAL | Status: AC
  Administered 2017-08-15: 40 meq via ORAL
  Filled 2017-08-15: qty 2

## 2017-08-15 MED ORDER — IPRATROPIUM-ALBUTEROL 0.5-2.5 (3) MG/3ML IN SOLN
3.0000 mL | Freq: Once | RESPIRATORY_TRACT | Status: AC
  Administered 2017-08-15: 3 mL via RESPIRATORY_TRACT
  Filled 2017-08-15: qty 3

## 2017-08-15 MED ORDER — METHYLPREDNISOLONE SODIUM SUCC 125 MG IJ SOLR
125.0000 mg | Freq: Once | INTRAMUSCULAR | Status: AC
  Administered 2017-08-15: 125 mg via INTRAVENOUS
  Filled 2017-08-15: qty 2

## 2017-08-15 NOTE — ED Provider Notes (Signed)
[facility] Emergency Department Provider Note  \ ____________________________________________   I have reviewed the triage vital signs and the nursing notes.   HISTORY  Chief Complaint Respiratory Distress   History limited by: Not Limited   HPI Tina Hayes is a 72 y.o. female who presents to the emergency department today via EMS because of concern for shortness of breath. It has been present for the past few days. Constant and progressively getting worse. It is worsened by exertion. She has had associated chest tightness. She has had some lower extremity swelling. Has history of COPD and CHF. EMS administered one breathing treatment which the patient stated helped her breathing. The patient has not had any fevers.    Per medical record review patient has a history of COPD, DM.   Past Medical History:  Diagnosis Date  . (HFpEF) heart failure with preserved ejection fraction ([place])    a. 05/2016 Echo: EF 60-65%, mild to mod LVH, Gr1 DD, mild MR, mildly dil LA, mod TR, mildly to mod increased PASP.  [name] Anxiety   . CHF (congestive heart failure) ([place])   . Chronic back pain   . COPD (chronic obstructive pulmonary disease) ([place])   . Coronary artery disease    a. s/p remote PCI x 5;  b. 2006 s/p CABG x 3 ([place], [place]); b. 05/2016 MV: attenuation corrected images w/o ischemia or wma-->Med rx.  . Depression   . Diabetes mellitus without complication ([place])   . Heart attack ([place])    Total of 3 per pt.  . Hypertension   . Seizures [facility])     Patient Active Problem List   Diagnosis Date Noted  . Dysphagia 04/23/2017  . Chronic diastolic congestive heart failure ([place]) 02/13/2017  . Acute diastolic heart failure ([place]) 01/27/2017  . Depression 12/11/2016  . Essential hypertension 06/30/2016  . Snoring 06/30/2016  . Diabetes mellitus ([place]) 06/30/2016  . Coronary artery disease of native artery of native heart with stable angina  pectoris ([place]) 06/17/2016  . Morbid obesity ([place]) 06/17/2016  . Chest pain 06/16/2016  . COPD (chronic obstructive pulmonary disease) ([place]) 08/28/2015  . Hypertensive urgency 06/03/2015    Past Surgical History:  Procedure Laterality Date  . ANKLE SURGERY    . COLONOSCOPY WITH PROPOFOL N/A 05/04/2017   Procedure: COLONOSCOPY WITH PROPOFOL;  Surgeon: [name], MD;  Location: [facility] ENDOSCOPY;  Service: Endoscopy;  Laterality: N/A;  . CORONARY ANGIOPLASTY    . CORONARY ARTERY BYPASS GRAFT    . ESOPHAGOGASTRODUODENOSCOPY (EGD) WITH PROPOFOL N/A 05/04/2017   Procedure: ESOPHAGOGASTRODUODENOSCOPY (EGD) WITH PROPOFOL;  Surgeon: [name], MD;  Location: [facility] ENDOSCOPY;  Service: Endoscopy;  Laterality: N/A;  . FRACTURE SURGERY    . KNEE SURGERY    . MOUTH SURGERY Left 07/14/2017    Prior to Admission medications   Medication Sig Start Date End Date Taking? Authorizing Provider  ACCU-CHEK AVIVA PLUS test strip  05/19/16   [provider]  albuterol (PROVENTIL HFA;VENTOLIN HFA) 108 (90 Base) MCG/ACT inhaler Inhale 2 puffs into the lungs every 6 (six) hours as needed for wheezing or shortness of breath. 08/30/15   Epifanio Lesches, MD  albuterol (PROVENTIL) (2.5 MG/3ML) 0.083% nebulizer solution Take 2.5 mg every 4 (four) hours as needed by nebulization for wheezing or shortness of breath.    [provider]  atorvastatin (LIPITOR) 40 MG tablet Take 40 mg at bedtime by mouth.     [provider]  Blood Glucose Monitoring Suppl (ACCU-CHEK AVIVA PLUS) w/Device KIT  06/13/16   [provider]  Calcium Carbonate-Vitamin D3 (CALCIUM 600-D) 600-400 MG-UNIT TABS Take 1 tablet 2 (two) times daily by mouth.    [provider]  carvedilol (COREG) 3.125 MG tablet Take 1 tablet (3.125 mg total) by mouth 2 (two) times daily with a meal. 08/12/16   Alisa Graff, FNP  cetirizine (ZYRTEC) 10 MG tablet Take 10 mg by mouth daily.    [provider]  citalopram (CELEXA) 20 MG tablet Take 20 mg by mouth daily.    [provider]  clopidogrel (PLAVIX) 75 MG tablet Take 1 tablet (75 mg total) by mouth daily. 08/14/16   Minna Merritts, MD  dextromethorphan (DELSYM) 30 MG/5ML liquid Take 15 mg every 12 (twelve) hours as needed by mouth for cough.    [provider]  esomeprazole (NEXIUM) 40 MG capsule Take 40 mg daily at 12 noon by mouth.    [provider]  ferrous sulfate 325 (65 FE) MG tablet Take 325 mg 3 (three) times daily with meals by mouth.    [provider]  insulin aspart (NOVOLOG) 100 UNIT/ML injection Inject 5-10 Units 2 (two) times daily with a meal into the skin. Take 10 units in the morning and 5 units in the evening with meal    [provider]  insulin glargine (LANTUS) 100 UNIT/ML injection Inject 30-40 Units 2 (two) times daily into the skin. Inject 30 units in the morning at 40 units at bedtime.    [provider]  isosorbide mononitrate (IMDUR) 30 MG 24 hr tablet Take 1 tablet (30 mg total) by mouth 2 (two) times daily. 08/12/16   Alisa Graff, FNP  levETIRAcetam (KEPPRA) 750 MG tablet Take 1 tablet (750 mg total) by mouth 2 (two) times daily. 06/05/15   Rai, Vernelle Emerald, MD  losartan (COZAAR) 100 MG tablet Take 50 mg by mouth daily.    [provider]  meloxicam (MOBIC) 7.5 MG tablet Take 7.5 mg daily by mouth.    [provider]  nitroGLYCERIN (NITROSTAT) 0.4 MG SL tablet DISSOLVE ONE TABLET UNDER THE TONGUE EVERY 5 MINUTES AS NEEDED FOR CHEST PAIN.  DO NOT EXCEED A TOTAL OF 3 DOSES IN 15 MINUTES 07/30/17   Minna Merritts, MD  polyethylene glycol-electrolytes (NULYTELY/GOLYTELY) 420 g solution Take 4,000 mLs by mouth once.    [provider]  potassium chloride SA (K-DUR,KLOR-CON) 20 MEQ tablet Take 2 tablets (40 mEq total) by mouth 3 (three) times daily. 07/07/17 10/05/17  Alisa Graff, FNP  tiotropium (SPIRIVA) 18 MCG inhalation capsule  Place 18 mcg into inhaler and inhale daily.    [provider]  torsemide (DEMADEX) 20 MG tablet Take 2 tablets (40 mg total) by mouth 2 (two) times daily. 07/07/17 10/05/17  Alisa Graff, FNP  traMADol (ULTRAM) 50 MG tablet Take 2 tablets (100 mg total) by mouth 4 (four) times daily. 04/24/17   Molli Barrows, MD  traZODone (DESYREL) 50 MG tablet Take 50 mg by mouth at bedtime.    [provider]  UNIFINE PENTIPS 31G X 6 MM [place]  07/23/16   [provider]    Allergies Aspirin  Family History  Problem Relation Age of Onset  . Diabetes Unknown   . Hypertension Unknown   . Diabetes Mother   . Heart failure Mother   . Heart disease Mother   . Heart attack Mother   .  Stroke Mother   . Depression Mother   . Hypertension Mother   . Cancer Sister        brain  . Hypertension Sister   . Diabetes Brother   . Hypertension Brother   . Heart failure Sister   . Heart attack Sister   . SIDS Sister     Social History Social History   Tobacco Use  . Smoking status: Former Smoker    Packs/day: 0.25    Years: 20.00    Pack years: 5.00    Types: Cigarettes    Last attempt to quit: 07/01/2006    Years since quitting: 11.1  . Smokeless tobacco: Never Used  Substance Use Topics  . Alcohol use: No    Alcohol/week: 0.0 oz  . Drug use: No    Review of Systems Constitutional: No fever/chills Eyes: No visual changes. ENT: No sore throat. Cardiovascular: Positive for chest tightness. Respiratory: Positive for shortness of breath. Gastrointestinal: No abdominal pain.  No nausea, no vomiting.  No diarrhea.   Genitourinary: Negative for dysuria. Musculoskeletal: Negative for back pain. Skin: Negative for rash. Neurological: Negative for headaches, focal weakness or numbness.  ____________________________________________   PHYSICAL EXAM:  VITAL SIGNS: ED Triage Vitals  Enc Vitals Group     BP --      Pulse Rate 08/15/17 2135 65     Resp 08/15/17 2135  (!) 24     Temp 08/15/17 2135 98.3 F (36.8 C)     Temp Source 08/15/17 2135 Oral     SpO2 08/15/17 2135 100 %     Weight 08/15/17 2137 (!) 322 lb (146.1 kg)     Height 08/15/17 2137 '5\' 11"'  (1.803 m)     Head Circumference --      Peak Flow --      Pain Score 08/15/17 2137 5   Constitutional: Alert and oriented. Mild respiratory distress. Eyes: Conjunctivae are normal.  ENT   Head: Normocephalic and atraumatic.   Nose: No congestion/rhinnorhea.   Mouth/Throat: Mucous membranes are moist.   Neck: No stridor. Hematological/Lymphatic/Immunilogical: No cervical lymphadenopathy. Cardiovascular: Normal rate, regular rhythm.  No murmurs, rubs, or gallops.  Respiratory: Increased respiratory effort and rate. Diminished breath sounds diffusely. Gastrointestinal: Soft and non tender. No rebound. No guarding.  Genitourinary: Deferred Musculoskeletal: Normal range of motion in all extremities. Neurologic:  Normal speech and language. No gross focal neurologic deficits are appreciated.  Skin:  Skin is warm, dry and intact. No rash noted. Psychiatric: Mood and affect are normal. Speech and behavior are normal. Patient exhibits appropriate insight and judgment.  ____________________________________________    LABS (pertinent positives/negatives)  Trop <0.03 BMP k 3.0, cr 1.00 CBC wbc 6.8, hgb 9.7, plt 235  ____________________________________________   EKG  I, Nance Pear, attending physician, personally viewed and interpreted this EKG  EKG Time: 2140 Rate: 63 Rhythm: sinus rhythm Axis: normal Intervals: qtc 485 QRS: narrow ST changes: no st elevation Impression: normal ekg   ____________________________________________    RADIOLOGY  CXR Mild edema  ____________________________________________   PROCEDURES  Procedures  ____________________________________________   INITIAL IMPRESSION / ASSESSMENT AND PLAN / ED COURSE  Pertinent labs & imaging  results that were available during my care of the patient were reviewed by me and considered in my medical decision making (see chart for details).  Patient presented to the emergency department today because of concerns for shortness of breath.  She does have a history of both COPD as well as CHF.  Some  mild edema noted to the lower extremities.  Chest x-ray showed some edema however no pneumonia pneumothorax.  The patient was given steroids as well as DuoNeb treatments which she stated did help with the shortness of breath however continue to be quite short of breath on exertion.  Patient's potassium was noted to be low on initial exam.  Patient will be admitted to hospital service.  ____________________________________________   FINAL CLINICAL IMPRESSION(S) / ED DIAGNOSES  Final diagnoses:  Shortness of breath  Acute pulmonary edema (HCC)  COPD exacerbation ([place])     Note: This dictation was prepared with Dragon dictation. Any transcriptional errors that result from this process are unintentional     Nance Pear, MD 08/16/17 [phone]

## 2017-08-15 NOTE — ED Notes (Signed)
Pt ambulated with cane unassisted approx 60 feet O2 sats start 100% fell to 95% and recovered to 99%, pt significantly "winded" after approx 30 feet, Dr Archie Balboa notified

## 2017-08-15 NOTE — ED Notes (Signed)
Patient transported to X-ray 

## 2017-08-15 NOTE — ED Triage Notes (Signed)
Patient presents to Emergency Department via EMS with complaints of SOB.    Per EMS pt HX of COPD and gave self 1 nebulizer, EMS gave 1 x duoneb and 2 lpm  which then SAT 94% -- unable to conduct 12 lead or BP.

## 2017-08-16 ENCOUNTER — Encounter: Payer: Self-pay | Admitting: Emergency Medicine

## 2017-08-16 DIAGNOSIS — J441 Chronic obstructive pulmonary disease with (acute) exacerbation: Secondary | ICD-10-CM | POA: Diagnosis not present

## 2017-08-16 LAB — GLUCOSE, CAPILLARY
GLUCOSE-CAPILLARY: 390 mg/dL — AB (ref 65–99)
GLUCOSE-CAPILLARY: 300 mg/dL — AB (ref 65–99)
GLUCOSE-CAPILLARY: 160 mg/dL — AB (ref 65–99)
Glucose-Capillary: 245 mg/dL — ABNORMAL HIGH (ref 65–99)
Glucose-Capillary: 376 mg/dL — ABNORMAL HIGH (ref 65–99)

## 2017-08-16 LAB — CBC
HEMATOCRIT: 29.8 % — AB (ref 35.0–47.0)
Hemoglobin: 9.7 g/dL — ABNORMAL LOW (ref 12.0–16.0)
MCH: 26.2 pg (ref 26.0–34.0)
MCHC: 32.6 g/dL (ref 32.0–36.0)
MCV: 80.3 fL (ref 80.0–100.0)
Platelets: 212 10*3/uL (ref 150–440)
RBC: 3.71 MIL/uL — AB (ref 3.80–5.20)
RDW: 17.4 % — ABNORMAL HIGH (ref 11.5–14.5)
WBC: 7 10*3/uL (ref 3.6–11.0)

## 2017-08-16 LAB — BASIC METABOLIC PANEL
ANION GAP: 9 (ref 5–15)
BUN: 19 mg/dL (ref 6–20)
CHLORIDE: 102 mmol/L (ref 101–111)
CO2: 27 mmol/L (ref 22–32)
Calcium: 8.4 mg/dL — ABNORMAL LOW (ref 8.9–10.3)
Creatinine, Ser: 1 mg/dL (ref 0.44–1.00)
GFR calc Af Amer: >60 mL/min (ref >60)
GFR calc non Af Amer: 56 mL/min — ABNORMAL LOW (ref >60)
Glucose, Bld: 365 mg/dL — ABNORMAL HIGH (ref 65–99)
Potassium: 3.8 mmol/L (ref 3.5–5.1)
SODIUM: 138 mmol/L (ref 135–145)

## 2017-08-16 LAB — HEMOGLOBIN A1C
HEMOGLOBIN A1C: 7.7 % — AB (ref 4.8–5.6)
Mean Plasma Glucose: 174.29 mg/dL

## 2017-08-16 MED ORDER — CITALOPRAM HYDROBROMIDE 20 MG PO TABS
20.0000 mg | ORAL_TABLET | Freq: Every day | ORAL | Status: DC
  Administered 2017-08-16: 20 mg via ORAL
  Filled 2017-08-16: qty 1

## 2017-08-16 MED ORDER — INSULIN ASPART 100 UNIT/ML ~~LOC~~ SOLN: 0.0000 [IU] | Freq: Every day | SUBCUTANEOUS | Status: DC

## 2017-08-16 MED ORDER — CLOPIDOGREL BISULFATE 75 MG PO TABS
75.0000 mg | ORAL_TABLET | Freq: Every day | ORAL | Status: DC
  Administered 2017-08-16 – 2017-08-17 (×2): 75 mg via ORAL
  Filled 2017-08-16 (×2): qty 1

## 2017-08-16 MED ORDER — ACETAMINOPHEN 650 MG RE SUPP: 650.0000 mg | Freq: Four times a day (QID) | RECTAL | Status: DC | PRN

## 2017-08-16 MED ORDER — FUROSEMIDE 10 MG/ML IJ SOLN
80.0000 mg | Freq: Two times a day (BID) | INTRAMUSCULAR | Status: DC
  Administered 2017-08-16 – 2017-08-17 (×3): 80 mg via INTRAVENOUS
  Filled 2017-08-16 (×3): qty 8

## 2017-08-16 MED ORDER — METHYLPREDNISOLONE SODIUM SUCC 125 MG IJ SOLR
60.0000 mg | Freq: Four times a day (QID) | INTRAMUSCULAR | Status: DC
  Administered 2017-08-17 (×2): 60 mg via INTRAVENOUS
  Filled 2017-08-16 (×2): qty 2

## 2017-08-16 MED ORDER — LEVETIRACETAM 750 MG PO TABS
750.0000 mg | ORAL_TABLET | Freq: Two times a day (BID) | ORAL | Status: DC
  Administered 2017-08-16 – 2017-08-17 (×4): 750 mg via ORAL
  Filled 2017-08-16 (×5): qty 1

## 2017-08-16 MED ORDER — TRAMADOL HCL 50 MG PO TABS
100.0000 mg | ORAL_TABLET | Freq: Four times a day (QID) | ORAL | Status: DC
  Administered 2017-08-16 – 2017-08-17 (×5): 100 mg via ORAL
  Filled 2017-08-16 (×5): qty 2

## 2017-08-16 MED ORDER — INSULIN ASPART 100 UNIT/ML ~~LOC~~ SOLN
0.0000 [IU] | Freq: Three times a day (TID) | SUBCUTANEOUS | Status: DC
  Administered 2017-08-16: 15 [IU] via SUBCUTANEOUS

## 2017-08-16 MED ORDER — ATORVASTATIN CALCIUM 20 MG PO TABS
40.0000 mg | ORAL_TABLET | Freq: Every day | ORAL | Status: DC
  Administered 2017-08-16: 40 mg via ORAL
  Filled 2017-08-16 (×2): qty 2

## 2017-08-16 MED ORDER — GUAIFENESIN-DM 100-10 MG/5ML PO SYRP: 5.0000 mL | ORAL_SOLUTION | ORAL | Status: DC | PRN

## 2017-08-16 MED ORDER — ONDANSETRON HCL 4 MG/2ML IJ SOLN: 4.0000 mg | Freq: Four times a day (QID) | INTRAMUSCULAR | Status: DC | PRN

## 2017-08-16 MED ORDER — ACETAMINOPHEN 325 MG PO TABS
650.0000 mg | ORAL_TABLET | Freq: Four times a day (QID) | ORAL | Status: DC | PRN
  Administered 2017-08-16: 650 mg via ORAL
  Filled 2017-08-16: qty 2

## 2017-08-16 MED ORDER — LOSARTAN POTASSIUM 50 MG PO TABS
50.0000 mg | ORAL_TABLET | Freq: Every day | ORAL | Status: DC
Start: 2017-08-16 — End: 2017-08-17
  Administered 2017-08-16 – 2017-08-17 (×2): 50 mg via ORAL
  Filled 2017-08-16 (×2): qty 1

## 2017-08-16 MED ORDER — INSULIN GLARGINE 100 UNIT/ML ~~LOC~~ SOLN
20.0000 [IU] | Freq: Two times a day (BID) | SUBCUTANEOUS | Status: DC
  Administered 2017-08-16 – 2017-08-17 (×3): 20 [IU] via SUBCUTANEOUS
  Filled 2017-08-16 (×5): qty 0.2

## 2017-08-16 MED ORDER — INSULIN ASPART 100 UNIT/ML ~~LOC~~ SOLN
0.0000 [IU] | Freq: Three times a day (TID) | SUBCUTANEOUS | Status: DC
  Administered 2017-08-16: 11 [IU] via SUBCUTANEOUS
  Administered 2017-08-17: 3 [IU] via SUBCUTANEOUS
  Filled 2017-08-16 (×2): qty 1

## 2017-08-16 MED ORDER — IPRATROPIUM-ALBUTEROL 0.5-2.5 (3) MG/3ML IN SOLN: 3.0000 mL | RESPIRATORY_TRACT | Status: DC | PRN

## 2017-08-16 MED ORDER — ISOSORBIDE MONONITRATE ER 30 MG PO TB24
30.0000 mg | ORAL_TABLET | Freq: Two times a day (BID) | ORAL | Status: DC
  Administered 2017-08-16 – 2017-08-17 (×4): 30 mg via ORAL
  Filled 2017-08-16 (×4): qty 1

## 2017-08-16 MED ORDER — TIOTROPIUM BROMIDE MONOHYDRATE 18 MCG IN CAPS
18.0000 ug | ORAL_CAPSULE | Freq: Every day | RESPIRATORY_TRACT | Status: DC
  Administered 2017-08-16 – 2017-08-17 (×2): 18 ug via RESPIRATORY_TRACT
  Filled 2017-08-16: qty 5

## 2017-08-16 MED ORDER — CARVEDILOL 6.25 MG PO TABS
3.1250 mg | ORAL_TABLET | Freq: Two times a day (BID) | ORAL | Status: DC
  Administered 2017-08-16 (×2): 3.125 mg via ORAL
  Filled 2017-08-16 (×3): qty 1

## 2017-08-16 MED ORDER — TORSEMIDE 20 MG PO TABS
40.0000 mg | ORAL_TABLET | Freq: Two times a day (BID) | ORAL | Status: DC
  Administered 2017-08-16: 40 mg via ORAL
  Filled 2017-08-16 (×2): qty 2

## 2017-08-16 MED ORDER — FUROSEMIDE 10 MG/ML IJ SOLN
40.0000 mg | Freq: Once | INTRAMUSCULAR | Status: AC
  Administered 2017-08-16: 40 mg via INTRAVENOUS
  Filled 2017-08-16: qty 4

## 2017-08-16 MED ORDER — ONDANSETRON HCL 4 MG PO TABS: 4.0000 mg | ORAL_TABLET | Freq: Four times a day (QID) | ORAL | Status: DC | PRN

## 2017-08-16 MED ORDER — TRAZODONE HCL 50 MG PO TABS
50.0000 mg | ORAL_TABLET | Freq: Every day | ORAL | Status: DC
  Administered 2017-08-16: 50 mg via ORAL
  Filled 2017-08-16: qty 1

## 2017-08-16 MED ORDER — PANTOPRAZOLE SODIUM 40 MG PO TBEC
40.0000 mg | DELAYED_RELEASE_TABLET | Freq: Two times a day (BID) | ORAL | Status: DC
  Administered 2017-08-16 – 2017-08-17 (×4): 40 mg via ORAL
  Filled 2017-08-16 (×4): qty 1

## 2017-08-16 MED ORDER — INSULIN ASPART 100 UNIT/ML ~~LOC~~ SOLN
20.0000 [IU] | SUBCUTANEOUS | Status: AC
  Administered 2017-08-16: 20 [IU] via SUBCUTANEOUS
  Filled 2017-08-16: qty 1

## 2017-08-16 MED ORDER — ENOXAPARIN SODIUM 40 MG/0.4ML ~~LOC~~ SOLN
40.0000 mg | Freq: Two times a day (BID) | SUBCUTANEOUS | Status: DC
  Administered 2017-08-16 – 2017-08-17 (×3): 40 mg via SUBCUTANEOUS
  Filled 2017-08-16 (×3): qty 0.4

## 2017-08-16 NOTE — Care Management Note (Signed)
Case Management Note  Patient Details  Name: Tina Hayes MRN: [id number] Date of Birth: 1945/12/29  Subjective/Objective:      Admitted to [facility] under observation status with the diagnosis of COPD. Lives with son Kathaleen Bury [phone]). Seen Dr Rosanne Ashing 2 weeks ago. Prescriptions are filled at [facility]. No Home Health, No skilled nursing. No home oxygen. Attends [facility]. Nebulizer and cane in the home. Takes care of all basic activities of living herself, doesn't drive. Son helps with errands. Last fall was 4-5 months ago. Good appetite. Son will transport.             Action/Plan: Will continue to follow for discharge needs   Expected Discharge Date:                  Expected Discharge Plan:     In-House Referral:     Discharge planning Services     Post Acute Care Choice:    Choice offered to:     DME Arranged:    DME Agency:     HH Arranged:    HH Agency:     Status of Service:     If discussed at [employer] of Stay Meetings, dates discussed:    Additional Comments:  Shelbie Ammons, RN MSN CCM Care Management [phone] 08/16/2017, 9:54 AM

## 2017-08-16 NOTE — Progress Notes (Signed)
Request for prayer. Prayer offered for latest medical condition and fears related to decline along with prayer for grandchildren who are important to [place] (preferred name).     08/16/17 0900  Clinical Encounter Type  Visited With Patient  Visit Type Initial  Referral From Patient  Spiritual Encounters  Spiritual Needs Prayer

## 2017-08-16 NOTE — Progress Notes (Signed)
Purpose of Encounter COPD, CHF and code status discussion  Parties in Attendance patient  Patients Decisional capacity Alert and oriented. Able to make decisions  Goals of care determination Patient understands her COPD exacerbation and treatment plan this admission. All questions asked. She wants her son to be her 51 and has told him she wants to be DNR/DNI. Code status changed   DNR  Time spent - 20 minutes

## 2017-08-16 NOTE — H&P (Addendum)
[place] [facility] [place] NAME: Tina Hayes    MR#:  [id number]  DATE OF BIRTH:  1945/10/21  DATE OF ADMISSION:  08/15/2017  PRIMARY CARE PHYSICIAN: Soles, Howell Rucks, MD   REQUESTING/REFERRING PHYSICIAN: Archie Balboa, MD  CHIEF COMPLAINT:   Chief Complaint  Patient presents with  . Respiratory Distress    HISTORY OF PRESENT ILLNESS:  Tina Hayes  is a 72 y.o. female who presents with several days of progressive respiratory distress.  Patient states she initially started with symptoms of seasonal allergies 4 to 5 days ago, and over the last 2 to 3 days has had progressive worsening of respiratory status.  She has had significant wheezing and cough.  She came to ED for evaluation today.  Hospitalist were called for admission for COPD exacerbation  PAST MEDICAL HISTORY:   Past Medical History:  Diagnosis Date  . (HFpEF) heart failure with preserved ejection fraction ([place])    a. 05/2016 Echo: EF 60-65%, mild to mod LVH, Gr1 DD, mild MR, mildly dil LA, mod TR, mildly to mod increased PASP.  [name] Anxiety   . CHF (congestive heart failure) ([place])   . Chronic back pain   . COPD (chronic obstructive pulmonary disease) ([place])   . Coronary artery disease    a. s/p remote PCI x 5;  b. 2006 s/p CABG x 3 ([place], [place]); b. 05/2016 MV: attenuation corrected images w/o ischemia or wma-->Med rx.  . Depression   . Diabetes mellitus without complication ([place])   . Heart attack ([place])    Total of 3 per pt.  . Hypertension   . Seizures ([place])      PAST SURGICAL HISTORY:   Past Surgical History:  Procedure Laterality Date  . ANKLE SURGERY    . COLONOSCOPY WITH PROPOFOL N/A 05/04/2017   Procedure: COLONOSCOPY WITH PROPOFOL;  Surgeon: [name], MD;  Location: [facility] ENDOSCOPY;  Service: Endoscopy;  Laterality: N/A;  . CORONARY ANGIOPLASTY    . CORONARY ARTERY BYPASS GRAFT    . ESOPHAGOGASTRODUODENOSCOPY (EGD) WITH  PROPOFOL N/A 05/04/2017   Procedure: ESOPHAGOGASTRODUODENOSCOPY (EGD) WITH PROPOFOL;  Surgeon: [name], MD;  Location: [facility] ENDOSCOPY;  Service: Endoscopy;  Laterality: N/A;  . FRACTURE SURGERY    . KNEE SURGERY    . MOUTH SURGERY Left 07/14/2017     SOCIAL HISTORY:   Social History   Tobacco Use  . Smoking status: Former Smoker    Packs/day: 0.25    Years: 20.00    Pack years: 5.00    Types: Cigarettes    Last attempt to quit: 07/01/2006    Years since quitting: 11.1  . Smokeless tobacco: Never Used  Substance Use Topics  . Alcohol use: No    Alcohol/week: 0.0 oz     FAMILY HISTORY:   Family History  Problem Relation Age of Onset  . Diabetes Unknown   . Hypertension Unknown   . Diabetes Mother   . Heart failure Mother   . Heart disease Mother   . Heart attack Mother   . Stroke Mother   . Depression Mother   . Hypertension Mother   . Cancer Sister        brain  . Hypertension Sister   . Diabetes Brother   . Hypertension Brother   . Heart failure Sister   . Heart attack Sister   . SIDS Sister      DRUG ALLERGIES:   Allergies  Allergen Reactions  . Aspirin Anaphylaxis    MEDICATIONS AT HOME:   Prior to Admission medications   Medication Sig Start Date End Date Taking? Authorizing Provider  albuterol (PROVENTIL HFA;VENTOLIN HFA) 108 (90 Base) MCG/ACT inhaler Inhale 2 puffs into the lungs every 6 (six) hours as needed for wheezing or shortness of breath. 08/30/15  Yes Epifanio Lesches, MD  albuterol (PROVENTIL) (2.5 MG/3ML) 0.083% nebulizer solution Take 2.5 mg every 4 (four) hours as needed by nebulization for wheezing or shortness of breath.   Yes [provider]  atorvastatin (LIPITOR) 40 MG tablet Take 40 mg at bedtime by mouth.    Yes [provider]  Calcium Carbonate-Vitamin D3 (CALCIUM 600-D) 600-400 MG-UNIT TABS Take 1 tablet 2 (two) times daily by mouth.   Yes [provider]  carvedilol (COREG) 3.125 MG tablet  Take 1 tablet (3.125 mg total) by mouth 2 (two) times daily with a meal. 08/12/16  Yes Hackney, Tina A, FNP  cetirizine (ZYRTEC) 10 MG tablet Take 10 mg by mouth daily.   Yes [provider]  citalopram (CELEXA) 20 MG tablet Take 20 mg by mouth at bedtime.    Yes [provider]  clopidogrel (PLAVIX) 75 MG tablet Take 1 tablet (75 mg total) by mouth daily. 08/14/16  Yes Minna Merritts, MD  dextromethorphan (DELSYM) 30 MG/5ML liquid Take 15 mg every 12 (twelve) hours as needed by mouth for cough.   Yes [provider]  esomeprazole (NEXIUM) 40 MG capsule Take 40 mg daily at 12 noon by mouth.   Yes [provider]  ferrous sulfate 325 (65 FE) MG tablet Take 325 mg 3 (three) times daily with meals by mouth.   Yes [provider]  insulin aspart (NOVOLOG) 100 UNIT/ML injection Inject 5-10 Units 2 (two) times daily with a meal into the skin. Take 10 units in the morning and 5 units in the evening with meal   Yes [provider]  insulin glargine (LANTUS) 100 UNIT/ML injection Inject 30-40 Units 2 (two) times daily into the skin. Inject 30 units in the morning at 40 units at bedtime.   Yes [provider]  isosorbide mononitrate (IMDUR) 30 MG 24 hr tablet Take 1 tablet (30 mg total) by mouth 2 (two) times daily. 08/12/16  Yes Darylene Price A, FNP  levETIRAcetam (KEPPRA) 750 MG tablet Take 1 tablet (750 mg total) by mouth 2 (two) times daily. 06/05/15  Yes Rai, Ripudeep K, MD  losartan (COZAAR) 100 MG tablet Take 50 mg by mouth daily.   Yes [provider]  meloxicam (MOBIC) 7.5 MG tablet Take 7.5 mg daily by mouth.   Yes [provider]  nitroGLYCERIN (NITROSTAT) 0.4 MG SL tablet DISSOLVE ONE TABLET UNDER THE TONGUE EVERY 5 MINUTES AS NEEDED FOR CHEST PAIN.  DO NOT EXCEED A TOTAL OF 3 DOSES IN 15 MINUTES 07/30/17  Yes Gollan, Kathlene November, MD  potassium chloride SA (K-DUR,KLOR-CON) 20 MEQ tablet Take 2 tablets (40 mEq total) by mouth 3  (three) times daily. 07/07/17 10/05/17 Yes Hackney, Otila Kluver A, FNP  tiotropium (SPIRIVA) 18 MCG inhalation capsule Place 18 mcg into inhaler and inhale daily.   Yes [provider]  torsemide (DEMADEX) 20 MG tablet Take 2 tablets (40 mg total) by mouth 2 (two) times daily. 07/07/17 10/05/17 Yes Hackney, Aura Fey, FNP  traMADol (ULTRAM) 50 MG tablet Take 2 tablets (100 mg total) by mouth 4 (four) times daily. 04/24/17  Yes Molli Barrows, MD  traZODone (DESYREL) 50 MG tablet Take 50 mg by mouth at bedtime.   Yes [provider]  ACCU-CHEK AVIVA PLUS test strip  05/19/16   [provider]  Blood Glucose Monitoring Suppl (ACCU-CHEK AVIVA PLUS) w/Device KIT  06/13/16   [provider]  UNIFINE PENTIPS 31G X 6 MM [place]  07/23/16   [provider]    REVIEW OF SYSTEMS:  Review of Systems  Constitutional: Negative for chills, fever, malaise/fatigue and weight loss.  HENT: Negative for ear pain, hearing loss and tinnitus.   Eyes: Negative for blurred vision, double vision, pain and redness.  Respiratory: Positive for cough, shortness of breath and wheezing. Negative for hemoptysis.   Cardiovascular: Negative for chest pain, palpitations, orthopnea and leg swelling.  Gastrointestinal: Negative for abdominal pain, constipation, diarrhea, nausea and vomiting.  Genitourinary: Negative for dysuria, frequency and hematuria.  Musculoskeletal: Negative for back pain, joint pain and neck pain.  Skin:       No acne, rash, or lesions  Neurological: Negative for dizziness, tremors, focal weakness and weakness.  Endo/Heme/Allergies: Negative for polydipsia. Does not bruise/bleed easily.  Psychiatric/Behavioral: Negative for depression. The patient is not nervous/anxious and does not have insomnia.      VITAL SIGNS:   Vitals:   08/15/17 2135 08/15/17 2137 08/15/17 2200 08/16/17 0052  BP:   (!) 149/66 (!) 157/91  Pulse: 65  (!) 31 72  Resp: (!) '24  20 18  ' Temp: 98.3 F  (36.8 C)     TempSrc: Oral     SpO2: 100%  (!) 79% 100%  Weight:  (!) 146.1 kg (322 lb)    Height:  '5\' 11"'  (1.803 m)     Wt Readings from Last 3 Encounters:  08/15/17 (!) 146.1 kg (322 lb)  08/10/17 (!) 146.3 kg (322 lb 8 oz)  07/30/17 (!) 145.2 kg (320 lb)    PHYSICAL EXAMINATION:  Physical Exam  Vitals reviewed. Constitutional: She is oriented to person, place, and time. She appears well-developed and well-nourished. No distress.  HENT:  Head: Normocephalic and atraumatic.  Mouth/Throat: Oropharynx is clear and moist.  Eyes: Pupils are equal, round, and reactive to light. Conjunctivae and EOM are normal. No scleral icterus.  Neck: Normal range of motion. Neck supple. No JVD present. No thyromegaly present.  Cardiovascular: Normal rate, regular rhythm and intact distal pulses. Exam reveals no gallop and no friction rub.  No murmur heard. Respiratory: She is in respiratory distress (Mild). She has wheezes. She has no rales.  GI: Soft. Bowel sounds are normal. She exhibits no distension. There is no tenderness.  Musculoskeletal: Normal range of motion. She exhibits no edema.  No arthritis, no gout  Lymphadenopathy:    She has no cervical adenopathy.  Neurological: She is alert and oriented to person, place, and time. No cranial nerve deficit.  No dysarthria, no aphasia  Skin: Skin is warm and dry. No rash noted. No erythema.  Psychiatric: She has a normal mood and affect. Her behavior is normal. Judgment and thought content normal.    LABORATORY PANEL:   CBC Recent Labs  Lab 08/15/17 2147  WBC 6.8  HGB 9.7*  HCT 29.4*  PLT 235   ------------------------------------------------------------------------------------------------------------------  Chemistries  Recent Labs  Lab 08/15/17 2147  NA 140  K 3.0*  CL 103  CO2 32  GLUCOSE 140*  BUN 17  CREATININE 1.00  CALCIUM 8.5*    ------------------------------------------------------------------------------------------------------------------  Cardiac Enzymes Recent Labs  Lab 08/15/17 2147  TROPONINI <0.03   ------------------------------------------------------------------------------------------------------------------  RADIOLOGY:  Dg Chest 2 View  Result Date: 08/15/2017 CLINICAL DATA:  Short of breath EXAM: CHEST - 2 VIEW COMPARISON:  03/16/2017 FINDINGS: Post sternotomy changes. Mild cardiomegaly with vascular congestion. Mild diffuse interstitial opacity likely reflects minimal edema. Moderate hiatal hernia. No pneumothorax. IMPRESSION: 1. Mild cardiomegaly with vascular congestion and minimal interstitial edema 2. Moderate hiatal hernia Electronically Signed   By: Donavan Foil M.D.   On: 08/15/2017 22:11    EKG:   Orders placed or performed during the hospital encounter of 08/15/17  . ED EKG  . ED EKG  . EKG 12-Lead  . EKG 12-Lead    IMPRESSION AND PLAN:  Principal Problem:   COPD with acute exacerbation (HCC) -IV Solu-Medrol, duo nebs, azithromycin, PRN supportive treatment Active Problems:   Coronary artery disease of native artery of native heart with stable angina pectoris ([place]) -continue home meds   Essential hypertension -home dose antihypertensives   Diabetes mellitus ([place]) -sliding scale insulin with corresponding glucose checks   Chronic diastolic congestive heart failure ([place]) -continue home medications  Chart review performed and case discussed with ED provider. Labs, imaging and/or ECG reviewed by provider and discussed with patient/family. Management plans discussed with the patient and/or family.  DVT PROPHYLAXIS: SubQ lovenox  GI PROPHYLAXIS: None  ADMISSION STATUS: Observation  CODE STATUS: Full Code Status History    Date Active Date Inactive Code Status Order ID Comments User Context   01/29/2017 1749 01/30/2017 2040 Full Code [id number]  Loletha Grayer, MD ED    06/16/2016 2036 06/18/2016 1749 Full Code [id number]  Epifanio Lesches, MD ED   08/28/2015 1034 08/30/2015 2023 Full Code [id number]  Dustin Flock, MD ED   06/03/2015 2238 06/05/2015 2044 Full Code [id number]  Brand Males, MD Inpatient   04/14/2015 1724 04/15/2015 2006 Full Code [id number]  Dustin Flock, MD Inpatient      TOTAL TIME TAKING CARE OF THIS PATIENT: 45 minutes.   Manasa Spease [place] 08/16/2017, 1:09 AM  [employer]  [phone]  CC: Primary care physician; Herminio Commons, MD  Note:  This document was prepared using Dragon voice recognition software and may include unintentional dictation errors.

## 2017-08-16 NOTE — ED Notes (Addendum)
Delay calling report d/t [place] IV for April, in room 4

## 2017-08-16 NOTE — Care Management Obs Status (Signed)
[facility] NOTIFICATION   Patient Details  Name: NEVAEH KORTE MRN: [id number] Date of Birth: [birth year]   Medicare Observation Status Notification Given:  Yes Permission to X    Shelbie Ammons, RN 08/16/2017, 10:01 AM

## 2017-08-16 NOTE — ED Notes (Addendum)
Reports fin att 

## 2017-08-17 DIAGNOSIS — J441 Chronic obstructive pulmonary disease with (acute) exacerbation: Secondary | ICD-10-CM | POA: Diagnosis not present

## 2017-08-17 LAB — GLUCOSE, CAPILLARY: Glucose-Capillary: 136 mg/dL — ABNORMAL HIGH (ref 65–99)

## 2017-08-17 MED ORDER — PREDNISONE 10 MG (21) PO TBPK: ORAL_TABLET | ORAL | 0 refills | Status: DC

## 2017-08-17 MED ORDER — TORSEMIDE 20 MG PO TABS: 60.0000 mg | ORAL_TABLET | Freq: Two times a day (BID) | ORAL | 0 refills | Status: DC

## 2017-08-17 NOTE — Progress Notes (Signed)
Ambulated patient on room air. Pt did well. no issues. Oxygen stayed above 92%

## 2017-08-17 NOTE — Discharge Instructions (Signed)
°-   Daily fluids < 2 liters. - Low salt diet - Check weight everyday and keep log. Take to your doctors appt. - Take extra dose of Torsemide if you gain more than 3 pounds weight.

## 2017-08-17 NOTE — Progress Notes (Signed)
Inpatient Diabetes Program Recommendations  AACE/ADA: New Consensus Statement on Inpatient Glycemic Control (2015)  Target Ranges:  Prepandial:   less than 140 mg/dL      Peak postprandial:   less than 180 mg/dL (1-2 hours)      Critically ill patients:  140 - 180 mg/dL   Results for VIRDIE, PENNING (MRN [id number]) as of 08/17/2017 12:32  Ref. Range 08/16/2017 07:22 08/16/2017 11:41 08/16/2017 16:24 08/16/2017 21:45 08/17/2017 08:16  Glucose-Capillary Latest Ref Range: 65 - 99 mg/dL 376 (H) 390 (H) 300 (H) 160 (H) 136 (H)  Results for GLENICE, CICCONE (MRN [id number]) as of 08/17/2017 12:32  Ref. Range 08/16/2017 04:49  Hemoglobin A1C Latest Ref Range: 4.8 - 5.6 % 7.7 (H)   Review of Glycemic Control  Diabetes history: DM2 Outpatient Diabetes medications: Lantus 30 units QAM, Lantus 40 units QHS, Novolog 10 units QAM, Novolog 5 units QPM Current orders for Inpatient glycemic control: Lantus 20 units BID, Novolog 0-20 units TID with meals, Novolog 0-5 units QHS; Solumedrol 60 mg Q6H  Inpatient Diabetes Program Recommendations: Insulin - Meal Coverage: If steroids are continued and post prandial glucose is consistently greater than 180 mg/dl, please consider ordering Novolog 5 units TID with meals for meal coverage if patient eats at least 50% of meals.  Thanks, Barnie Alderman, RN, MSN, CDE Diabetes Coordinator Inpatient Diabetes Program [phone] (Team Pager from 8am to 5pm)

## 2017-08-17 NOTE — Progress Notes (Signed)
[name] to be D/C'd Home per MD order.  Discussed prescriptions and follow up appointments with the patient. Prescriptions given to patient, medication list explained in detail. Pt verbalized understanding.  Allergies as of 08/17/2017      Reactions   Aspirin Anaphylaxis      Medication List    TAKE these medications   ACCU-CHEK AVIVA PLUS test strip Generic drug:  glucose blood   ACCU-CHEK AVIVA PLUS w/Device Kit   albuterol (2.5 MG/3ML) 0.083% nebulizer solution Commonly known as:  PROVENTIL Take 2.5 mg every 4 (four) hours as needed by nebulization for wheezing or shortness of breath.   albuterol 108 (90 Base) MCG/ACT inhaler Commonly known as:  PROVENTIL HFA;VENTOLIN HFA Inhale 2 puffs into the lungs every 6 (six) hours as needed for wheezing or shortness of breath.   atorvastatin 40 MG tablet Commonly known as:  LIPITOR Take 40 mg at bedtime by mouth.   CALCIUM 600-D 600-400 MG-UNIT Tabs Generic drug:  Calcium Carbonate-Vitamin D3 Take 1 tablet 2 (two) times daily by mouth.   carvedilol 3.125 MG tablet Commonly known as:  COREG Take 1 tablet (3.125 mg total) by mouth 2 (two) times daily with a meal.   cetirizine 10 MG tablet Commonly known as:  ZYRTEC Take 10 mg by mouth daily.   citalopram 20 MG tablet Commonly known as:  CELEXA Take 20 mg by mouth at bedtime.   clopidogrel 75 MG tablet Commonly known as:  PLAVIX Take 1 tablet (75 mg total) by mouth daily.   DELSYM 30 MG/5ML liquid Generic drug:  dextromethorphan Take 15 mg every 12 (twelve) hours as needed by mouth for cough.   esomeprazole 40 MG capsule Commonly known as:  NEXIUM Take 40 mg daily at 12 noon by mouth.   ferrous sulfate 325 (65 FE) MG tablet Take 325 mg 3 (three) times daily with meals by mouth.   insulin aspart 100 UNIT/ML injection Commonly known as:  novoLOG Inject 5-10 Units 2 (two) times daily with a meal into the skin. Take 10 units in the morning and 5 units in the  evening with meal   insulin glargine 100 UNIT/ML injection Commonly known as:  LANTUS Inject 30-40 Units 2 (two) times daily into the skin. Inject 30 units in the morning at 40 units at bedtime.   isosorbide mononitrate 30 MG 24 hr tablet Commonly known as:  IMDUR Take 1 tablet (30 mg total) by mouth 2 (two) times daily.   levETIRAcetam 750 MG tablet Commonly known as:  KEPPRA Take 1 tablet (750 mg total) by mouth 2 (two) times daily.   losartan 100 MG tablet Commonly known as:  COZAAR Take 50 mg by mouth daily.   meloxicam 7.5 MG tablet Commonly known as:  MOBIC Take 7.5 mg daily by mouth.   nitroGLYCERIN 0.4 MG SL tablet Commonly known as:  NITROSTAT DISSOLVE ONE TABLET UNDER THE TONGUE EVERY 5 MINUTES AS NEEDED FOR CHEST PAIN.  DO NOT EXCEED A TOTAL OF 3 DOSES IN 15 MINUTES   potassium chloride SA 20 MEQ tablet Commonly known as:  K-DUR,KLOR-CON Take 2 tablets (40 mEq total) by mouth 3 (three) times daily.   predniSONE 10 MG (21) Tbpk tablet Commonly known as:  STERAPRED UNI-PAK 21 TAB 6 tabs day 1 and taper 10 mg a day - 6 days   tiotropium 18 MCG inhalation capsule Commonly known as:  SPIRIVA Place 18 mcg into inhaler and inhale daily.   torsemide 20 MG tablet  Commonly known as:  DEMADEX Take 3 tablets (60 mg total) by mouth 2 (two) times daily. What changed:  how much to take   traMADol 50 MG tablet Commonly known as:  ULTRAM Take 2 tablets (100 mg total) by mouth 4 (four) times daily.   traZODone 50 MG tablet Commonly known as:  DESYREL Take 50 mg by mouth at bedtime.   UNIFINE PENTIPS 31G X 6 MM Misc Generic drug:  Insulin Pen Needle       Vitals:   08/17/17 0823 08/17/17 0941  BP: (!) 159/61   Pulse: (!) 59   Resp:    Temp:    SpO2:  92%    Skin clean, dry and intact without evidence of skin break down, no evidence of skin tears noted. IV catheter discontinued intact. Site without signs and symptoms of complications. Dressing and pressure  applied. Pt denies pain at this time. No complaints noted.  An After Visit Summary was printed and given to the patient. Patient escorted via [place], and D/C home via private auto.  Sharalyn Ink

## 2017-08-18 ENCOUNTER — Ambulatory Visit
Admission: RE | Admit: 2017-08-18 | Discharge: 2017-08-18 | Disposition: A | Discharge status: Home / Self Care | Payer: Medicare HMO | Source: Ambulatory Visit | Attending: Registered Nurse | Admitting: Registered Nurse

## 2017-08-18 DIAGNOSIS — R2241 Localized swelling, mass and lump, right lower limb: Secondary | ICD-10-CM | POA: Diagnosis not present

## 2017-08-18 DIAGNOSIS — L729 Follicular cyst of the skin and subcutaneous tissue, unspecified: Secondary | ICD-10-CM

## 2017-08-20 ENCOUNTER — Ambulatory Visit: Payer: Medicare HMO | Admitting: Family

## 2017-08-26 NOTE — Discharge Summary (Signed)
[facility] [place] NAME: Tina Hayes    MR#:  [id number]  DATE OF BIRTH:  [birth year]  DATE OF ADMISSION:  08/15/2017 ADMITTING PHYSICIAN: Lance Coon, MD  DATE OF DISCHARGE: 08/17/2017  1:26 PM  PRIMARY CARE PHYSICIAN: Soles, Howell Rucks, MD   ADMISSION DIAGNOSIS:  Shortness of breath [R06.02] Acute pulmonary edema (HCC) [J81.0] COPD exacerbation (HCC) [J44.1]  DISCHARGE DIAGNOSIS:  Principal Problem:   COPD with acute exacerbation (HCC) Active Problems:   Coronary artery disease of native artery of native heart with stable angina pectoris (HCC)   Essential hypertension   Diabetes mellitus (HCC)   Chronic diastolic congestive heart failure ([place])   SECONDARY DIAGNOSIS:   Past Medical History:  Diagnosis Date  . (HFpEF) heart failure with preserved ejection fraction ([place])    a. 05/2016 Echo: EF 60-65%, mild to mod LVH, Gr1 DD, mild MR, mildly dil LA, mod TR, mildly to mod increased PASP.  [name] Anxiety   . CHF (congestive heart failure) ([place])   . Chronic back pain   . COPD (chronic obstructive pulmonary disease) ([place])   . Coronary artery disease    a. s/p remote PCI x 5;  b. 2006 s/p CABG x 3 ([place]); b. 05/2016 MV: attenuation corrected images w/o ischemia or wma-->Med rx.  . Depression   . Diabetes mellitus without complication ([place])   . Heart attack ([place])    Total of 3 per pt.  . Hypertension   . Seizures ([place])      ADMITTING HISTORY  HISTORY OF PRESENT ILLNESS:  Tina Hayes  is a 72 y.o. female who presents with several days of progressive respiratory distress.  Patient states she initially started with symptoms of seasonal allergies 4 to 5 days ago, and over the last 2 to 3 days has had progressive worsening of respiratory status.  She has had significant wheezing and cough.  She came to ED for evaluation today.  Hospitalist were called for admission for COPD exacerbation.  HOSPITAL  COURSE:   * COPD exacerbation * CAD * HTN * DM * Chronic diastolic chf  Patient treated with IV steroids, nebulizers and oxygen as needed. Improved well.  By day of discharge her breathing has improved.  Still has mild wheezing but close to baseline. Will be discharged on prednisone taper inhalers.  Stable for discharge home  CONSULTS OBTAINED:    DRUG ALLERGIES:   Allergies  Allergen Reactions  . Aspirin Anaphylaxis    DISCHARGE MEDICATIONS:   Allergies as of 08/17/2017      Reactions   Aspirin Anaphylaxis      Medication List    TAKE these medications   ACCU-CHEK AVIVA PLUS test strip Generic drug:  glucose blood   ACCU-CHEK AVIVA PLUS w/Device Kit   albuterol (2.5 MG/3ML) 0.083% nebulizer solution Commonly known as:  PROVENTIL Take 2.5 mg every 4 (four) hours as needed by nebulization for wheezing or shortness of breath.   albuterol 108 (90 Base) MCG/ACT inhaler Commonly known as:  PROVENTIL HFA;VENTOLIN HFA Inhale 2 puffs into the lungs every 6 (six) hours as needed for wheezing or shortness of breath.   atorvastatin 40 MG tablet Commonly known as:  LIPITOR Take 40 mg at bedtime by mouth.   CALCIUM 600-D 600-400 MG-UNIT Tabs Generic drug:  Calcium Carbonate-Vitamin D3 Take 1 tablet 2 (two) times daily by mouth.   carvedilol 3.125 MG tablet Commonly known as:  COREG Take 1  tablet (3.125 mg total) by mouth 2 (two) times daily with a meal.   cetirizine 10 MG tablet Commonly known as:  ZYRTEC Take 10 mg by mouth daily.   citalopram 20 MG tablet Commonly known as:  CELEXA Take 20 mg by mouth at bedtime.   clopidogrel 75 MG tablet Commonly known as:  PLAVIX Take 1 tablet (75 mg total) by mouth daily.   DELSYM 30 MG/5ML liquid Generic drug:  dextromethorphan Take 15 mg every 12 (twelve) hours as needed by mouth for cough.   esomeprazole 40 MG capsule Commonly known as:  NEXIUM Take 40 mg daily at 12 noon by mouth.   ferrous sulfate 325 (65  FE) MG tablet Take 325 mg 3 (three) times daily with meals by mouth.   insulin aspart 100 UNIT/ML injection Commonly known as:  novoLOG Inject 5-10 Units 2 (two) times daily with a meal into the skin. Take 10 units in the morning and 5 units in the evening with meal   insulin glargine 100 UNIT/ML injection Commonly known as:  LANTUS Inject 30-40 Units 2 (two) times daily into the skin. Inject 30 units in the morning at 40 units at bedtime.   isosorbide mononitrate 30 MG 24 hr tablet Commonly known as:  IMDUR Take 1 tablet (30 mg total) by mouth 2 (two) times daily.   levETIRAcetam 750 MG tablet Commonly known as:  KEPPRA Take 1 tablet (750 mg total) by mouth 2 (two) times daily.   losartan 100 MG tablet Commonly known as:  COZAAR Take 50 mg by mouth daily.   meloxicam 7.5 MG tablet Commonly known as:  MOBIC Take 7.5 mg daily by mouth.   nitroGLYCERIN 0.4 MG SL tablet Commonly known as:  NITROSTAT DISSOLVE ONE TABLET UNDER THE TONGUE EVERY 5 MINUTES AS NEEDED FOR CHEST PAIN.  DO NOT EXCEED A TOTAL OF 3 DOSES IN 15 MINUTES   potassium chloride SA 20 MEQ tablet Commonly known as:  K-DUR,KLOR-CON Take 2 tablets (40 mEq total) by mouth 3 (three) times daily.   predniSONE 10 MG (21) Tbpk tablet Commonly known as:  STERAPRED UNI-PAK 21 TAB 6 tabs day 1 and taper 10 mg a day - 6 days   tiotropium 18 MCG inhalation capsule Commonly known as:  SPIRIVA Place 18 mcg into inhaler and inhale daily.   torsemide 20 MG tablet Commonly known as:  DEMADEX Take 3 tablets (60 mg total) by mouth 2 (two) times daily. What changed:  how much to take   traMADol 50 MG tablet Commonly known as:  ULTRAM Take 2 tablets (100 mg total) by mouth 4 (four) times daily.   traZODone 50 MG tablet Commonly known as:  DESYREL Take 50 mg by mouth at bedtime.   UNIFINE PENTIPS 31G X 6 MM Misc Generic drug:  Insulin Pen Needle       Today   VITAL SIGNS:  Blood pressure (!) 159/61, pulse (!)  59, temperature 98 F (36.7 C), temperature source Oral, resp. rate 18, height _0  (1.803 m), weight (!) 144.6 kg (318 lb 11.2 oz), SpO2 92 %.  I/O:  No intake or output data in the 24 hours ending 08/26/17 1623  PHYSICAL EXAMINATION:  Physical Exam  GENERAL:  72 y.o.-year-old patient lying in the bed with no acute distress.  LUNGS: Normal breath sounds bilaterally, no wheezing, rales,rhonchi or crepitation. No use of accessory muscles of respiration.  CARDIOVASCULAR: S1, S2 normal. No murmurs, rubs, or gallops.  ABDOMEN: Soft, non-tender, non-distended.  Bowel sounds present. No organomegaly or mass.  NEUROLOGIC: Moves all 4 extremities. PSYCHIATRIC: The patient is alert and oriented x 3.  SKIN: No obvious rash, lesion, or ulcer.   DATA REVIEW:   CBC No results for input(s): WBC, HGB, HCT, PLT in the last 168 hours.  Chemistries  No results for input(s): NA, K, CL, CO2, GLUCOSE, BUN, CREATININE, CALCIUM, MG, AST, ALT, ALKPHOS, BILITOT in the last 168 hours.  Invalid input(s): GFRCGP  Cardiac Enzymes No results for input(s): TROPONINI in the last 168 hours.  Microbiology Results  Results for orders placed or performed during the hospital encounter of 08/28/15  Culture, blood (routine x 2)     Status: Abnormal   Collection Time: 08/28/15  8:03 AM  Result Value Ref Range Status   Specimen Description BLOOD RIGHT AC  Final   Special Requests BOTTLES DRAWN AEROBIC AND ANAEROBIC 1ML  Final   Culture  Setup Time   Final    GRAM POSITIVE RODS AEROBIC BOTTLE ONLY CRITICAL RESULT CALLED TO, READ BACK BY AND VERIFIED WITH: JASON ROBBINS at 2120 08/30/15 KL[facility]. Organism ID to follow    Culture (A)  Final    DIPHTHEROIDS(CORYNEBACTERIUM SPECIES) Standardized susceptibility testing for this organism is not available. Results consistent with contamination.    Report Status 09/02/2015 FINAL  Final  Blood Culture ID Panel (Reflexed)     Status: None   Collection Time: 08/28/15  8:03  AM  Result Value Ref Range Status   Enterococcus species NOT DETECTED NOT DETECTED Final   Vancomycin resistance NOT DETECTED NOT DETECTED Final   Listeria monocytogenes NOT DETECTED NOT DETECTED Final   Staphylococcus species NOT DETECTED NOT DETECTED Final   Staphylococcus aureus NOT DETECTED NOT DETECTED Final   Methicillin resistance NOT DETECTED NOT DETECTED Final   Streptococcus species NOT DETECTED NOT DETECTED Final   Streptococcus agalactiae NOT DETECTED NOT DETECTED Final   Streptococcus pneumoniae NOT DETECTED NOT DETECTED Final   Streptococcus pyogenes NOT DETECTED NOT DETECTED Final   Acinetobacter baumannii NOT DETECTED NOT DETECTED Final   Enterobacteriaceae species NOT DETECTED NOT DETECTED Final   Enterobacter cloacae complex NOT DETECTED NOT DETECTED Final   Escherichia coli NOT DETECTED NOT DETECTED Final   Klebsiella oxytoca NOT DETECTED NOT DETECTED Final   Klebsiella pneumoniae NOT DETECTED NOT DETECTED Final   Proteus species NOT DETECTED NOT DETECTED Final   Serratia marcescens NOT DETECTED NOT DETECTED Final   Carbapenem resistance NOT DETECTED NOT DETECTED Final   Haemophilus influenzae NOT DETECTED NOT DETECTED Final   Neisseria meningitidis NOT DETECTED NOT DETECTED Final   Pseudomonas aeruginosa NOT DETECTED NOT DETECTED Final   Candida albicans NOT DETECTED NOT DETECTED Final   Candida glabrata NOT DETECTED NOT DETECTED Final   Candida krusei NOT DETECTED NOT DETECTED Final   Candida parapsilosis NOT DETECTED NOT DETECTED Final   Candida tropicalis NOT DETECTED NOT DETECTED Final  Culture, blood (routine x 2)     Status: None   Collection Time: 08/28/15  8:04 AM  Result Value Ref Range Status   Specimen Description BLOOD LEFT AC  Final   Special Requests   Final    BOTTLES DRAWN AEROBIC AND ANAEROBIC AER 1ML ANA .5ML   Culture NO GROWTH 5 DAYS  Final   Report Status 09/02/2015 FINAL  Final    RADIOLOGY:  No results found.  Follow up with PCP in  1 week.  Management plans discussed with the patient, family and they are in agreement.  CODE STATUS:  Code Status History    Date Active Date Inactive Code Status Order ID Comments User Context   08/16/2017 1230 08/17/2017 1631 DNR [id number]  Hillary Bow, MD Inpatient   08/16/2017 0253 08/16/2017 1230 Full Code [id number]  Lance Coon, MD Inpatient   01/29/2017 1749 01/30/2017 2040 Full Code [id number]  Loletha Grayer, MD ED   06/16/2016 2036 06/18/2016 1749 Full Code [id number]  Epifanio Lesches, MD ED   08/28/2015 1034 08/30/2015 2023 Full Code [id number]  Dustin Flock, MD ED   06/03/2015 2238 06/05/2015 2044 Full Code [id number]  Brand Males, MD Inpatient   04/14/2015 1724 04/15/2015 2006 Full Code [id number]  Dustin Flock, MD Inpatient    Questions for Most Recent Historical Code Status (Order [id number])    Question Answer Comment   In the event of cardiac or respiratory ARREST Do not call a "code blue"    In the event of cardiac or respiratory ARREST Do not perform Intubation, CPR, defibrillation or ACLS    In the event of cardiac or respiratory ARREST Use medication by any route, position, wound care, and other measures to relive pain and suffering. May use oxygen, suction and manual treatment of airway obstruction as needed for comfort.       TOTAL TIME TAKING CARE OF THIS PATIENT ON DAY OF DISCHARGE: more than 30 minutes.   Leia Alf Cheyla Duchemin M.D on 08/26/2017 at 4:23 PM  Between 7am to 6pm - Pager - [phone]  After 6pm go to [email or web address] - password [email or web address] [facility]  Office  [phone]  CC: Primary care physician; Herminio Commons, MD  Note: This dictation was prepared with Dragon dictation along with smaller phrase technology. Any transcriptional errors that result from this process are unintentional.

## 2017-09-03 NOTE — Progress Notes (Signed)
Patient ID: Tina Hayes, female    DOB: 1945-08-11, 72 y.o.   MRN: [id number]  HPI  [name] is a 72 y/o female with a history of seizures, HTN, cardiac stents, DM, depression, CAD, COPD, chronic back pain, anxiety, obesity, remote tobacco use and chronic heart failure.   Last echo was done 06/17/16 and showed an EF of 60-65% along with mild MR and moderate TR. Pulmonary systolic pressure was mildly to moderately elevated. Stress test done 06/18/16.  Admitted 4//20/19 due to COPD exacerbation. Given IV steroids, nebulizers and oxygen. Discharged after 2 days on prednisone taper. Was in the ED 03/16/17 due to chest pain where she was treated and released.   She presents today for her follow-up visit with a chief complaint of minimal shortness of breath. She says that this has been chronic in nature having been present for several years but has improved quite a bit since hospital admission. She has associated fatigue, dysphagia, chronic back pain and chronic difficulty sleeping along with this. She denies any depression, abdominal distention, palpitations, edema, chest pain, cough, dizziness or weight gain. Says that yesterday she was able to "run around the house with grandkids".   Past Medical History:  Diagnosis Date  . (HFpEF) heart failure with preserved ejection fraction ([place])    a. 05/2016 Echo: EF 60-65%, mild to mod LVH, Gr1 DD, mild MR, mildly dil LA, mod TR, mildly to mod increased PASP.  [name] Anxiety   . CHF (congestive heart failure) ([place])   . Chronic back pain   . COPD (chronic obstructive pulmonary disease) ([place])   . Coronary artery disease    a. s/p remote PCI x 5;  b. 2006 s/p CABG x 3 ([place], [place]); b. 05/2016 MV: attenuation corrected images w/o ischemia or wma-->Med rx.  . Depression   . Diabetes mellitus without complication ([place])   . Heart attack ([place])    Total of 3 per pt.  . Hypertension   . Seizures ([place])    Past Surgical History:   Procedure Laterality Date  . ANKLE SURGERY    . COLONOSCOPY WITH PROPOFOL N/A 05/04/2017   Procedure: COLONOSCOPY WITH PROPOFOL;  Surgeon: [name], MD;  Location: [facility] ENDOSCOPY;  Service: Endoscopy;  Laterality: N/A;  . CORONARY ANGIOPLASTY    . CORONARY ARTERY BYPASS GRAFT    . ESOPHAGOGASTRODUODENOSCOPY (EGD) WITH PROPOFOL N/A 05/04/2017   Procedure: ESOPHAGOGASTRODUODENOSCOPY (EGD) WITH PROPOFOL;  Surgeon: [name], MD;  Location: [facility] ENDOSCOPY;  Service: Endoscopy;  Laterality: N/A;  . FRACTURE SURGERY    . KNEE SURGERY    . MOUTH SURGERY Left 07/14/2017   Family History  Problem Relation Age of Onset  . Diabetes Unknown   . Hypertension Unknown   . Diabetes Mother   . Heart failure Mother   . Heart disease Mother   . Heart attack Mother   . Stroke Mother   . Depression Mother   . Hypertension Mother   . Cancer Sister        brain  . Hypertension Sister   . Diabetes Brother   . Hypertension Brother   . Heart failure Sister   . Heart attack Sister   . SIDS Sister    Social History   Tobacco Use  . Smoking status: Former Smoker    Packs/day: 0.25    Years: 20.00    Pack years: 5.00    Types: Cigarettes    Last attempt to quit: 07/01/2006  Years since quitting: 11.1  . Smokeless tobacco: Never Used  Substance Use Topics  . Alcohol use: No    Alcohol/week: 0.0 oz   Allergies  Allergen Reactions  . Aspirin Anaphylaxis   Prior to Admission medications   Medication Sig Start Date End Date Taking? Authorizing Provider  ACCU-CHEK AVIVA PLUS test strip  05/19/16  Yes [provider]  albuterol (PROVENTIL HFA;VENTOLIN HFA) 108 (90 Base) MCG/ACT inhaler Inhale 2 puffs into the lungs every 6 (six) hours as needed for wheezing or shortness of breath. 08/30/15  Yes Epifanio Lesches, MD  albuterol (PROVENTIL) (2.5 MG/3ML) 0.083% nebulizer solution Take 2.5 mg every 4 (four) hours as needed by nebulization for wheezing or shortness of breath.    Yes [provider]  atorvastatin (LIPITOR) 40 MG tablet Take 40 mg at bedtime by mouth.    Yes [provider]  Blood Glucose Monitoring Suppl (ACCU-CHEK AVIVA PLUS) w/Device KIT  06/13/16  Yes [provider]  Calcium Carbonate-Vitamin D3 (CALCIUM 600-D) 600-400 MG-UNIT TABS Take 1 tablet 2 (two) times daily by mouth.   Yes [provider]  carvedilol (COREG) 3.125 MG tablet Take 1 tablet (3.125 mg total) by mouth 2 (two) times daily with a meal. 08/12/16  Yes Hackney, Tina A, FNP  cetirizine (ZYRTEC) 10 MG tablet Take 10 mg by mouth daily.   Yes [provider]  citalopram (CELEXA) 20 MG tablet Take 20 mg by mouth at bedtime.    Yes [provider]  clopidogrel (PLAVIX) 75 MG tablet Take 1 tablet (75 mg total) by mouth daily. 08/14/16  Yes Minna Merritts, MD  dextromethorphan (DELSYM) 30 MG/5ML liquid Take 15 mg every 12 (twelve) hours as needed by mouth for cough.   Yes [provider]  esomeprazole (NEXIUM) 40 MG capsule Take 40 mg daily at 12 noon by mouth.   Yes [provider]  ferrous sulfate 325 (65 FE) MG tablet Take 325 mg 3 (three) times daily with meals by mouth.   Yes [provider]  insulin aspart (NOVOLOG) 100 UNIT/ML injection Inject 5-10 Units 2 (two) times daily with a meal into the skin. Take 10 units in the morning and 5 units in the evening with meal   Yes [provider]  insulin glargine (LANTUS) 100 UNIT/ML injection Inject 30-40 Units 2 (two) times daily into the skin. Inject 30 units in the morning at 40 units at bedtime.   Yes [provider]  isosorbide mononitrate (IMDUR) 30 MG 24 hr tablet Take 1 tablet (30 mg total) by mouth 2 (two) times daily. 08/12/16  Yes Darylene Price A, FNP  levETIRAcetam (KEPPRA) 750 MG tablet Take 1 tablet (750 mg total) by mouth 2 (two) times daily. 06/05/15  Yes Rai, Ripudeep K, MD  losartan (COZAAR) 100 MG tablet Take 50 mg by mouth daily.   Yes  [provider]  meloxicam (MOBIC) 7.5 MG tablet Take 7.5 mg daily by mouth.   Yes [provider]  nitroGLYCERIN (NITROSTAT) 0.4 MG SL tablet DISSOLVE ONE TABLET UNDER THE TONGUE EVERY 5 MINUTES AS NEEDED FOR CHEST PAIN.  DO NOT EXCEED A TOTAL OF 3 DOSES IN 15 MINUTES 07/30/17  Yes Gollan, Kathlene November, MD  potassium chloride SA (K-DUR,KLOR-CON) 20 MEQ tablet Take 2 tablets (40 mEq total) by mouth 3 (three) times daily. 07/07/17 10/05/17 Yes Hackney, Tina A, FNP  predniSONE (STERAPRED UNI-PAK 21 TAB) 10 MG (21) TBPK tablet 6 tabs day 1 and taper 10  mg a day - 6 days 08/17/17  Yes Sudini, Alveta Heimlich, MD  tiotropium (SPIRIVA) 18 MCG inhalation capsule Place 18 mcg into inhaler and inhale daily.   Yes [provider]  torsemide (DEMADEX) 20 MG tablet Take 3 tablets (60 mg total) by mouth 2 (two) times daily. 08/17/17 09/16/17 Yes Sudini, Alveta Heimlich, MD  traMADol (ULTRAM) 50 MG tablet Take 2 tablets (100 mg total) by mouth 4 (four) times daily. 04/24/17  Yes Molli Barrows, MD  traZODone (DESYREL) 50 MG tablet Take 50 mg by mouth at bedtime.   Yes [provider]  UNIFINE PENTIPS 31G X 6 MM [place]  07/23/16  Yes [provider]    Review of Systems  Constitutional: Positive for fatigue. Negative for appetite change.  HENT: Positive for trouble swallowing ("chronic"). Negative for congestion, postnasal drip and sore throat.   Eyes: Negative.   Respiratory: Positive for shortness of breath ("just a little bit"). Negative for cough and chest tightness.   Cardiovascular: Negative for chest pain, palpitations and leg swelling.  Gastrointestinal: Positive for vomiting (at times). Negative for abdominal distention, abdominal pain and nausea.  Endocrine: Negative.   Genitourinary: Negative.   Musculoskeletal: Positive for arthralgias (left shoulder pain) and back pain (chronic). Negative for neck pain.  Skin: Negative.   Allergic/Immunologic: Negative.   Neurological: Negative  for dizziness, weakness, light-headedness and headaches.  Hematological: Negative for adenopathy. Does not bruise/bleed easily.  Psychiatric/Behavioral: Positive for sleep disturbance. Negative for dysphoric mood and suicidal ideas. The patient is not nervous/anxious.    Vitals:   09/07/17 0831  BP: 122/80  Pulse: (!) 58  Resp: 18  SpO2: 99%  Weight: (!) 316 lb 4 oz (143.5 kg)  Height: 5' 11" (1.803 m)   Wt Readings from Last 3 Encounters:  09/07/17 (!) 316 lb 4 oz (143.5 kg)  08/16/17 (!) 318 lb 11.2 oz (144.6 kg)  08/10/17 (!) 322 lb 8 oz (146.3 kg)   Lab Results  Component Value Date   CREATININE 1.00 08/16/2017   CREATININE 1.00 08/15/2017   CREATININE 1.16 (H) 07/16/2017    Physical Exam  Constitutional: She is oriented to person, place, and time. She appears well-developed and well-nourished.  HENT:  Head: Normocephalic and atraumatic.  Neck: Normal range of motion. Neck supple. No JVD present.  Cardiovascular: Regular rhythm. Bradycardia present.  Pulmonary/Chest: Effort normal. She has no wheezes. She has no rales.  Abdominal: Soft. She exhibits no distension. There is no tenderness.  Musculoskeletal: She exhibits no edema or tenderness.  Neurological: She is alert and oriented to person, place, and time.  Skin: Skin is warm and dry.  Psychiatric: She has a normal mood and affect. Her behavior is normal. Thought content normal.  Tearful at times  Nursing note and vitals reviewed.  Assessment & Plan:  1: Chronic heart failure with preserved ejection fraction- - NYHA class II - euvolemic today - weighing daily and says that her home weight has gradually declined.  Reminded to call for an overnight weight gain of >2 pounds or a weekly weight gain of >5 pounds.  - weight down 6 pounds since she was last here 08/10/17 - has not been adding salt to her foods and is trying to eat low sodium foods - trying to keep fluid intake closer to 60 ounces daily but admits that  it's difficult to do - feels much better since she was discharged from the hospital last month - saw her cardiologist Rockey Situ) 02/17/17  2: HTN- -  BP better since resuming losartan at 8m daily - will check a BMP today since she resumed her losartan last visit - BMP from 08/16/17 reviewed; sodium 138, potassium 3.8 and GFR >60 - [facility] 3: Diabetes-  - fasting glucose at home today was 77 - A1c on 01/29/17 was 8.5%  4: Depression- - she says that her PCP has resumed her antidepressant and she feels "much better"  5: Dysphagia- - patient says that she continues to have difficulty swallowing  - has GI appointment 09/24/17 - had endoscopy done January 2019  Patient did not bring her medications nor a list. Each medication was verbally reviewed with the patient and she was encouraged to bring the bottles to every visit to confirm accuracy of list.  Return in 2 months or sooner for any questions/problems before then.

## 2017-09-07 ENCOUNTER — Other Ambulatory Visit: Payer: Self-pay | Admitting: Family

## 2017-09-07 ENCOUNTER — Encounter: Payer: Self-pay | Admitting: Family

## 2017-09-07 ENCOUNTER — Telehealth: Payer: Self-pay | Admitting: Family

## 2017-09-07 ENCOUNTER — Ambulatory Visit: Payer: Medicare HMO | Attending: Family | Admitting: Family

## 2017-09-07 VITALS — BP 122/80 | HR 58 | Resp 18 | Ht 71.0 in | Wt 316.2 lb

## 2017-09-07 DIAGNOSIS — Z808 Family history of malignant neoplasm of other organs or systems: Secondary | ICD-10-CM | POA: Insufficient documentation

## 2017-09-07 DIAGNOSIS — Z79899 Other long term (current) drug therapy: Secondary | ICD-10-CM | POA: Insufficient documentation

## 2017-09-07 DIAGNOSIS — I5032 Chronic diastolic (congestive) heart failure: Secondary | ICD-10-CM | POA: Diagnosis not present

## 2017-09-07 DIAGNOSIS — I252 Old myocardial infarction: Secondary | ICD-10-CM | POA: Diagnosis not present

## 2017-09-07 DIAGNOSIS — Z8249 Family history of ischemic heart disease and other diseases of the circulatory system: Secondary | ICD-10-CM | POA: Insufficient documentation

## 2017-09-07 DIAGNOSIS — R569 Unspecified convulsions: Secondary | ICD-10-CM | POA: Insufficient documentation

## 2017-09-07 DIAGNOSIS — Z794 Long term (current) use of insulin: Secondary | ICD-10-CM | POA: Diagnosis not present

## 2017-09-07 DIAGNOSIS — Z951 Presence of aortocoronary bypass graft: Secondary | ICD-10-CM | POA: Insufficient documentation

## 2017-09-07 DIAGNOSIS — Z79891 Long term (current) use of opiate analgesic: Secondary | ICD-10-CM | POA: Insufficient documentation

## 2017-09-07 DIAGNOSIS — I2510 Atherosclerotic heart disease of native coronary artery without angina pectoris: Secondary | ICD-10-CM | POA: Diagnosis not present

## 2017-09-07 DIAGNOSIS — Z9889 Other specified postprocedural states: Secondary | ICD-10-CM | POA: Diagnosis not present

## 2017-09-07 DIAGNOSIS — G8929 Other chronic pain: Secondary | ICD-10-CM | POA: Diagnosis not present

## 2017-09-07 DIAGNOSIS — Z955 Presence of coronary angioplasty implant and graft: Secondary | ICD-10-CM | POA: Diagnosis not present

## 2017-09-07 DIAGNOSIS — E669 Obesity, unspecified: Secondary | ICD-10-CM | POA: Diagnosis not present

## 2017-09-07 DIAGNOSIS — F329 Major depressive disorder, single episode, unspecified: Secondary | ICD-10-CM | POA: Diagnosis not present

## 2017-09-07 DIAGNOSIS — I10 Essential (primary) hypertension: Secondary | ICD-10-CM

## 2017-09-07 DIAGNOSIS — I110 Hypertensive heart disease with heart failure: Secondary | ICD-10-CM | POA: Diagnosis present

## 2017-09-07 DIAGNOSIS — F419 Anxiety disorder, unspecified: Secondary | ICD-10-CM | POA: Insufficient documentation

## 2017-09-07 DIAGNOSIS — Z87891 Personal history of nicotine dependence: Secondary | ICD-10-CM | POA: Diagnosis not present

## 2017-09-07 DIAGNOSIS — Z833 Family history of diabetes mellitus: Secondary | ICD-10-CM | POA: Diagnosis not present

## 2017-09-07 DIAGNOSIS — Z7952 Long term (current) use of systemic steroids: Secondary | ICD-10-CM | POA: Insufficient documentation

## 2017-09-07 DIAGNOSIS — Z7902 Long term (current) use of antithrombotics/antiplatelets: Secondary | ICD-10-CM | POA: Diagnosis not present

## 2017-09-07 DIAGNOSIS — E119 Type 2 diabetes mellitus without complications: Secondary | ICD-10-CM

## 2017-09-07 DIAGNOSIS — Z6841 Body Mass Index (BMI) 40.0 and over, adult: Secondary | ICD-10-CM | POA: Insufficient documentation

## 2017-09-07 DIAGNOSIS — Z886 Allergy status to analgesic agent status: Secondary | ICD-10-CM | POA: Insufficient documentation

## 2017-09-07 DIAGNOSIS — Z823 Family history of stroke: Secondary | ICD-10-CM | POA: Insufficient documentation

## 2017-09-07 DIAGNOSIS — R1310 Dysphagia, unspecified: Secondary | ICD-10-CM

## 2017-09-07 DIAGNOSIS — M549 Dorsalgia, unspecified: Secondary | ICD-10-CM | POA: Insufficient documentation

## 2017-09-07 LAB — BASIC METABOLIC PANEL
ANION GAP: 9 (ref 5–15)
BUN: 17 mg/dL (ref 6–20)
CALCIUM: 8.4 mg/dL — AB (ref 8.9–10.3)
CHLORIDE: 100 mmol/L — AB (ref 101–111)
CO2: 31 mmol/L (ref 22–32)
Creatinine, Ser: 0.91 mg/dL (ref 0.44–1.00)
GFR calc non Af Amer: >60 mL/min (ref >60)
Glucose, Bld: 92 mg/dL (ref 65–99)
Potassium: 2.7 mmol/L — CL (ref 3.5–5.1)
Sodium: 140 mmol/L (ref 135–145)

## 2017-09-07 NOTE — Telephone Encounter (Signed)
Called patient regarding critical potassium result from BMP drawn today (09/07/17). Patient is currently taking 5meq TID along with torsemide 60mg  BID.  Advised patient to add an additional 54meq potassium daily until further notice. Will get a BMP drawn on 09/11/17 and adjust potassium at that time if needed. Patient verbalized understanding.

## 2017-09-07 NOTE — Patient Instructions (Signed)
Continue weighing daily and call for an overnight weight gain of > 2 pounds or a weekly weight gain of >5 pounds. 

## 2017-09-11 ENCOUNTER — Inpatient Hospital Stay: Admission: RE | Admit: 2017-09-11 | Payer: Medicare HMO | Source: Ambulatory Visit

## 2017-09-11 DIAGNOSIS — R2241 Localized swelling, mass and lump, right lower limb: Secondary | ICD-10-CM | POA: Insufficient documentation

## 2017-09-15 ENCOUNTER — Other Ambulatory Visit: Payer: Self-pay | Admitting: Family

## 2017-09-18 ENCOUNTER — Other Ambulatory Visit: Payer: Self-pay | Admitting: Family

## 2017-09-18 ENCOUNTER — Inpatient Hospital Stay: Admission: RE | Admit: 2017-09-18 | Payer: Medicare HMO | Source: Ambulatory Visit

## 2017-09-18 ENCOUNTER — Encounter
Admission: RE | Admit: 2017-09-18 | Discharge: 2017-09-18 | Disposition: A | Discharge status: Home / Self Care | Payer: Medicare HMO | Source: Ambulatory Visit | Attending: Family | Admitting: Family

## 2017-09-18 ENCOUNTER — Telehealth: Payer: Self-pay | Admitting: Family

## 2017-09-18 DIAGNOSIS — I10 Essential (primary) hypertension: Secondary | ICD-10-CM | POA: Diagnosis not present

## 2017-09-18 LAB — BASIC METABOLIC PANEL
ANION GAP: 12 (ref 5–15)
BUN: 19 mg/dL (ref 6–20)
CHLORIDE: 97 mmol/L — AB (ref 101–111)
CO2: 30 mmol/L (ref 22–32)
CREATININE: 1.05 mg/dL — AB (ref 0.44–1.00)
Calcium: 8.6 mg/dL — ABNORMAL LOW (ref 8.9–10.3)
GFR calc non Af Amer: 53 mL/min — ABNORMAL LOW (ref >60)
Glucose, Bld: 180 mg/dL — ABNORMAL HIGH (ref 65–99)
POTASSIUM: 2.6 mmol/L — AB (ref 3.5–5.1)
SODIUM: 139 mmol/L (ref 135–145)

## 2017-09-18 MED ORDER — POTASSIUM CHLORIDE CRYS ER 20 MEQ PO TBCR: 40.0000 meq | EXTENDED_RELEASE_TABLET | Freq: Three times a day (TID) | ORAL | 5 refills | Status: DC

## 2017-09-18 NOTE — Progress Notes (Signed)
Opened in error

## 2017-09-18 NOTE — Telephone Encounter (Signed)
Lab called to say that her potassium level is 2.6. Patient says that she's eating bananas and has been taking 6 potassium pills daily (was supposed to be taking 8 with last low potassium reading). She says that her mouth feels dry, she has no swelling and her weight is down to 302 pounds.  Advised patient to decrease her torsemide from 60mg  BID to 60mg  daily and to continue taking 6 potassium pills daily (she takes 2 tablets TID). Did instruct her to take an additional 2 potassium tablets today.  Should she develop any weight gain, edema over the weekend, she can take an additional torsemide if needed. Will recheck lab work on 09/25/17

## 2017-09-23 ENCOUNTER — Ambulatory Visit: Payer: Medicare HMO | Admitting: Anesthesiology

## 2017-09-25 ENCOUNTER — Telehealth: Payer: Self-pay | Admitting: Family

## 2017-09-25 ENCOUNTER — Encounter
Admission: RE | Admit: 2017-09-25 | Discharge: 2017-09-25 | Disposition: A | Discharge status: Home / Self Care | Payer: Medicare HMO | Source: Ambulatory Visit | Attending: Family | Admitting: Family

## 2017-09-25 DIAGNOSIS — Z01812 Encounter for preprocedural laboratory examination: Secondary | ICD-10-CM | POA: Insufficient documentation

## 2017-09-25 DIAGNOSIS — I110 Hypertensive heart disease with heart failure: Secondary | ICD-10-CM | POA: Diagnosis not present

## 2017-09-25 DIAGNOSIS — I2510 Atherosclerotic heart disease of native coronary artery without angina pectoris: Secondary | ICD-10-CM | POA: Diagnosis not present

## 2017-09-25 DIAGNOSIS — Z79891 Long term (current) use of opiate analgesic: Secondary | ICD-10-CM | POA: Insufficient documentation

## 2017-09-25 DIAGNOSIS — F329 Major depressive disorder, single episode, unspecified: Secondary | ICD-10-CM | POA: Insufficient documentation

## 2017-09-25 DIAGNOSIS — E119 Type 2 diabetes mellitus without complications: Secondary | ICD-10-CM | POA: Insufficient documentation

## 2017-09-25 DIAGNOSIS — J449 Chronic obstructive pulmonary disease, unspecified: Secondary | ICD-10-CM | POA: Diagnosis not present

## 2017-09-25 DIAGNOSIS — G8929 Other chronic pain: Secondary | ICD-10-CM | POA: Diagnosis not present

## 2017-09-25 DIAGNOSIS — Z79899 Other long term (current) drug therapy: Secondary | ICD-10-CM | POA: Insufficient documentation

## 2017-09-25 DIAGNOSIS — Z955 Presence of coronary angioplasty implant and graft: Secondary | ICD-10-CM | POA: Diagnosis not present

## 2017-09-25 DIAGNOSIS — Z9889 Other specified postprocedural states: Secondary | ICD-10-CM | POA: Diagnosis not present

## 2017-09-25 DIAGNOSIS — F419 Anxiety disorder, unspecified: Secondary | ICD-10-CM | POA: Insufficient documentation

## 2017-09-25 DIAGNOSIS — Z8669 Personal history of other diseases of the nervous system and sense organs: Secondary | ICD-10-CM | POA: Insufficient documentation

## 2017-09-25 DIAGNOSIS — I509 Heart failure, unspecified: Secondary | ICD-10-CM | POA: Diagnosis not present

## 2017-09-25 LAB — BASIC METABOLIC PANEL
Anion gap: 10 (ref 5–15)
BUN: 26 mg/dL — AB (ref 6–20)
CALCIUM: 8.8 mg/dL — AB (ref 8.9–10.3)
CO2: 26 mmol/L (ref 22–32)
Chloride: 103 mmol/L (ref 101–111)
Creatinine, Ser: 1.04 mg/dL — ABNORMAL HIGH (ref 0.44–1.00)
GFR calc Af Amer: >60 mL/min (ref >60)
GFR calc non Af Amer: 54 mL/min — ABNORMAL LOW (ref >60)
GLUCOSE: 147 mg/dL — AB (ref 65–99)
Potassium: 3.9 mmol/L (ref 3.5–5.1)
Sodium: 139 mmol/L (ref 135–145)

## 2017-09-25 NOTE — Telephone Encounter (Signed)
Spoke with patient regarding labs drawn today (09/25/17). Advised patient that her potassium level was now normal.  She is to continue taking 70meq potassium TID along with 60mg  torsemide QD.

## 2017-09-29 ENCOUNTER — Ambulatory Visit: Payer: Medicare HMO | Admitting: Anesthesiology

## 2017-09-29 ENCOUNTER — Other Ambulatory Visit: Payer: Self-pay | Admitting: Family

## 2017-09-29 MED ORDER — TORSEMIDE 20 MG PO TABS: 60.0000 mg | ORAL_TABLET | Freq: Every day | ORAL | 5 refills | Status: DC

## 2017-10-01 ENCOUNTER — Other Ambulatory Visit: Payer: Self-pay | Admitting: Student

## 2017-10-01 DIAGNOSIS — R1310 Dysphagia, unspecified: Secondary | ICD-10-CM

## 2017-10-08 ENCOUNTER — Telehealth: Payer: Self-pay | Admitting: Cardiovascular Disease

## 2017-10-08 NOTE — Telephone Encounter (Signed)
° °  [facility] Pre-operative Risk Assessment    Request for surgical clearance:  1. What type of surgery is being performed? Upper Endoscopy   2. When is this surgery scheduled? 10/28/17  3. What type of clearance is required (medical clearance vs. Pharmacy clearance to hold med vs. Both)? Pharmacy   4. Are there any medications that need to be held prior to surgery and how long? Plavix x 5 prior  Practice name and name of physician performing surgery? Dr Alice Reichert with [facility]  5. What is your office phone number [phone]   7.   What is your office fax number [phone]  8.   Anesthesia type (None, local, MAC, general) ? nOT LISTED

## 2017-10-08 NOTE — Telephone Encounter (Signed)
Called patient. She denies any new symptoms. She experiences shortness of breath and has history of COPD. It appears Darylene Price, NP has been managing patient since last time she saw Dr Rockey Situ. She is due to see Dr Rockey Situ for 6 month follow. Scheduled at first available to see Dr Rockey Situ on 11/09/17. Routing to Dr Rockey Situ to review for clearance or offer appointment slot ok to put patient prior to 10/28/17 for EGD.

## 2017-10-09 ENCOUNTER — Ambulatory Visit: Payer: Medicare HMO

## 2017-10-09 NOTE — Telephone Encounter (Signed)
Acceptable risk for procedure She can stop Plavix 5 days prior to surgery When she is not on Plavix will take low-dose aspirin 81 mg daily After surgery could switch back to Plavix

## 2017-10-12 NOTE — Telephone Encounter (Signed)
No answer. Left message for patient to call us back.  Routed clearance to number provided via fax through [facility].

## 2017-10-12 NOTE — Telephone Encounter (Signed)
Patient returning call.

## 2017-10-12 NOTE — Telephone Encounter (Signed)
Called patient. 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 Advised patient not to take but do follow recommendations to stop plavix 5 days prior to surgery. Routing to Dr Rockey Situ to make him aware of allergy to aspirin.

## 2017-10-13 ENCOUNTER — Ambulatory Visit
Admission: RE | Admit: 2017-10-13 | Discharge: 2017-10-13 | Disposition: A | Discharge status: Home / Self Care | Payer: Medicare HMO | Source: Ambulatory Visit | Attending: Student | Admitting: Student

## 2017-10-13 DIAGNOSIS — K449 Diaphragmatic hernia without obstruction or gangrene: Secondary | ICD-10-CM | POA: Diagnosis not present

## 2017-10-13 DIAGNOSIS — K219 Gastro-esophageal reflux disease without esophagitis: Secondary | ICD-10-CM | POA: Diagnosis not present

## 2017-10-13 DIAGNOSIS — R1310 Dysphagia, unspecified: Secondary | ICD-10-CM | POA: Insufficient documentation

## 2017-10-13 DIAGNOSIS — K228 Other specified diseases of esophagus: Secondary | ICD-10-CM | POA: Insufficient documentation

## 2017-10-14 NOTE — Telephone Encounter (Signed)
I don't see aspirin listed in her allergies Can we find out the details and put it in her chart

## 2017-10-15 NOTE — Telephone Encounter (Signed)
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

## 2017-10-26 ENCOUNTER — Ambulatory Visit: Payer: Medicare HMO | Attending: Anesthesiology | Admitting: Anesthesiology

## 2017-10-26 ENCOUNTER — Encounter: Payer: Self-pay | Admitting: Anesthesiology

## 2017-10-26 VITALS — BP 131/63 | HR 69 | Temp 98.1°F | Resp 18 | Ht 71.0 in | Wt 312.0 lb

## 2017-10-26 DIAGNOSIS — M5432 Sciatica, left side: Secondary | ICD-10-CM

## 2017-10-26 DIAGNOSIS — M47817 Spondylosis without myelopathy or radiculopathy, lumbosacral region: Secondary | ICD-10-CM

## 2017-10-26 DIAGNOSIS — M170 Bilateral primary osteoarthritis of knee: Secondary | ICD-10-CM | POA: Diagnosis not present

## 2017-10-26 DIAGNOSIS — Z951 Presence of aortocoronary bypass graft: Secondary | ICD-10-CM | POA: Diagnosis not present

## 2017-10-26 DIAGNOSIS — M5431 Sciatica, right side: Secondary | ICD-10-CM

## 2017-10-26 DIAGNOSIS — G894 Chronic pain syndrome: Secondary | ICD-10-CM

## 2017-10-26 DIAGNOSIS — M5442 Lumbago with sciatica, left side: Secondary | ICD-10-CM | POA: Insufficient documentation

## 2017-10-26 DIAGNOSIS — Z79899 Other long term (current) drug therapy: Secondary | ICD-10-CM | POA: Insufficient documentation

## 2017-10-26 DIAGNOSIS — K449 Diaphragmatic hernia without obstruction or gangrene: Secondary | ICD-10-CM | POA: Insufficient documentation

## 2017-10-26 DIAGNOSIS — F1190 Opioid use, unspecified, uncomplicated: Secondary | ICD-10-CM | POA: Diagnosis not present

## 2017-10-26 DIAGNOSIS — K228 Other specified diseases of esophagus: Secondary | ICD-10-CM | POA: Diagnosis not present

## 2017-10-26 DIAGNOSIS — M5136 Other intervertebral disc degeneration, lumbar region: Secondary | ICD-10-CM | POA: Diagnosis not present

## 2017-10-26 DIAGNOSIS — K219 Gastro-esophageal reflux disease without esophagitis: Secondary | ICD-10-CM | POA: Diagnosis not present

## 2017-10-26 DIAGNOSIS — M79605 Pain in left leg: Secondary | ICD-10-CM | POA: Diagnosis present

## 2017-10-26 DIAGNOSIS — R531 Weakness: Secondary | ICD-10-CM | POA: Diagnosis not present

## 2017-10-26 DIAGNOSIS — M5441 Lumbago with sciatica, right side: Secondary | ICD-10-CM | POA: Insufficient documentation

## 2017-10-26 DIAGNOSIS — M545 Low back pain: Secondary | ICD-10-CM | POA: Diagnosis present

## 2017-10-26 DIAGNOSIS — Z79891 Long term (current) use of opiate analgesic: Secondary | ICD-10-CM | POA: Diagnosis not present

## 2017-10-26 DIAGNOSIS — M48062 Spinal stenosis, lumbar region with neurogenic claudication: Secondary | ICD-10-CM | POA: Diagnosis not present

## 2017-10-26 DIAGNOSIS — M79604 Pain in right leg: Secondary | ICD-10-CM | POA: Insufficient documentation

## 2017-10-26 MED ORDER — HYDROCODONE-ACETAMINOPHEN 5-325 MG PO TABS: 1.0000 | ORAL_TABLET | Freq: Two times a day (BID) | ORAL | 0 refills | Status: DC

## 2017-10-26 NOTE — Progress Notes (Signed)
Nursing Pain Medication Assessment:  Safety precautions to be maintained throughout the outpatient stay will include: orient to surroundings, keep bed in low position, maintain call bell within reach at all times, provide assistance with transfer out of bed and ambulation.  Medication Inspection Compliance: Pill count conducted under aseptic conditions, in front of the patient. Neither the pills nor the bottle was removed from the patient's sight at any time. Once count was completed pills were immediately returned to the patient in their original bottle.  Medication: Tramadol (Ultram) Pill/Patch Count: 0 of 240 pills remain Pill/Patch Appearance: Markings consistent with prescribed medication Bottle Appearance: Standard pharmacy container. Clearly labeled. Filled Date: 04 / 04 / 2019 Last Medication intake:  Ran out of medicine more than 48 hours ago

## 2017-10-26 NOTE — Patient Instructions (Signed)
Pain Management Discharge Instructions  General Discharge Instructions :  If you need to reach your doctor call: Monday-Friday 8:00 am - 4:00 pm at [phone] or toll free [phone].  After clinic hours [phone] to have operator reach doctor.  Bring all of your medication bottles to all your appointments in the pain clinic.  To cancel or reschedule your appointment with Pain Management please remember to call 24 hours in advance to avoid a fee.  Refer to the educational materials which you have been given on: General Risks, I had my Procedure. Discharge Instructions, Post Sedation.  Post Procedure Instructions:  The drugs you were given will stay in your system until tomorrow, so for the next 24 hours you should not drive, make any legal decisions or drink any alcoholic beverages.  You may eat anything you prefer, but it is better to start with liquids then soups and crackers, and gradually work up to solid foods.  Please notify your doctor immediately if you have any unusual bleeding, trouble breathing or pain that is not related to your normal pain.  Depending on the type of procedure that was done, some parts of your body may feel week and/or numb.  This usually clears up by tonight or the next day.  Walk with the use of an assistive device or accompanied by an adult for the 24 hours.  You may use ice on the affected area for the first 24 hours.  Put ice in a Ziploc bag and cover with a towel and place against area 15 minutes on 15 minutes off.  You may switch to heat after 24 hours.GENERAL RISKS AND COMPLICATIONS  What are the risk, side effects and possible complications? Generally speaking, most procedures are safe.  However, with any procedure there are risks, side effects, and the possibility of complications.  The risks and complications are dependent upon the sites that are lesioned, or the type of nerve block to be performed.  The closer the procedure is to the spine,  the more serious the risks are.  Great care is taken when placing the radio frequency needles, block needles or lesioning probes, but sometimes complications can occur. 1. Infection: Any time there is an injection through the skin, there is a risk of infection.  This is why sterile conditions are used for these blocks.  There are four possible types of infection. 1. Localized skin infection. 2. Central Nervous System Infection-This can be in the form of Meningitis, which can be deadly. 3. Epidural Infections-This can be in the form of an epidural abscess, which can cause pressure inside of the spine, causing compression of the spinal cord with subsequent paralysis. This would require an emergency surgery to decompress, and there are no guarantees that the patient would recover from the paralysis. 4. Discitis-This is an infection of the intervertebral discs.  It occurs in about 1% of discography procedures.  It is difficult to treat and it may lead to surgery.        2. Pain: the needles have to go through skin and soft tissues, will cause soreness.       3. Damage to internal structures:  The nerves to be lesioned may be near blood vessels or    other nerves which can be potentially damaged.       4. Bleeding: Bleeding is more common if the patient is taking blood thinners such as  aspirin, Coumadin, Ticiid, Plavix, etc., or if he/she have some genetic predisposition  such as   hemophilia. Bleeding into the spinal canal can cause compression of the spinal  cord with subsequent paralysis.  This would require an emergency surgery to  decompress and there are no guarantees that the patient would recover from the  paralysis.       5. Pneumothorax:  Puncturing of a lung is a possibility, every time a needle is introduced in  the area of the chest or upper back.  Pneumothorax refers to free air around the  collapsed lung(s), inside of the thoracic cavity (chest cavity).  Another two possible  complications  related to a similar event would include: Hemothorax and Chylothorax.   These are variations of the Pneumothorax, where instead of air around the collapsed  lung(s), you may have blood or chyle, respectively.       6. Spinal headaches: They may occur with any procedures in the area of the spine.       7. Persistent CSF (Cerebro-Spinal Fluid) leakage: This is a rare problem, but may occur  with prolonged intrathecal or epidural catheters either due to the formation of a fistulous  track or a dural tear.       8. Nerve damage: By working so close to the spinal cord, there is always a possibility of  nerve damage, which could be as serious as a permanent spinal cord injury with  paralysis.       9. Death:  Although rare, severe deadly allergic reactions known as "Anaphylactic  reaction" can occur to any of the medications used.      10. Worsening of the symptoms:  We can always make thing worse.  What are the chances of something like this happening? Chances of any of this occuring are extremely low.  By statistics, you have more of a chance of getting killed in a motor vehicle accident: while driving to the hospital than any of the above occurring .  Nevertheless, you should be aware that they are possibilities.  In general, it is similar to taking a shower.  Everybody knows that you can slip, hit your head and get killed.  Does that mean that you should not shower again?  Nevertheless always keep in mind that statistics do not mean anything if you happen to be on the wrong side of them.  Even if a procedure has a 1 (one) in a 1,000,000 (million) chance of going wrong, it you happen to be that one..Also, keep in mind that by statistics, you have more of a chance of having something go wrong when taking medications.  Who should not have this procedure? If you are on a blood thinning medication (e.g. Coumadin, Plavix, see list of "Blood Thinners"), or if you have an active infection going on, you should not  have the procedure.  If you are taking any blood thinners, please inform your physician.  How should I prepare for this procedure?  Do not eat or drink anything at least six hours prior to the procedure.  Bring a driver with you .  It cannot be a taxi.  Come accompanied by an adult that can drive you back, and that is strong enough to help you if your legs get weak or numb from the local anesthetic.  Take all of your medicines the morning of the procedure with just enough water to swallow them.  If you have diabetes, make sure that you are scheduled to have your procedure done first thing in the morning, whenever possible.  If you have diabetes,   take only half of your insulin dose and notify our nurse that you have done so as soon as you arrive at the clinic.  If you are diabetic, but only take blood sugar pills (oral hypoglycemic), then do not take them on the morning of your procedure.  You may take them after you have had the procedure.  Do not take aspirin or any aspirin-containing medications, at least eleven (11) days prior to the procedure.  They may prolong bleeding.  Wear loose fitting clothing that may be easy to take off and that you would not mind if it got stained with Betadine or blood.  Do not wear any jewelry or perfume  Remove any nail coloring.  It will interfere with some of our monitoring equipment.  NOTE: Remember that this is not meant to be interpreted as a complete list of all possible complications.  Unforeseen problems may occur.  BLOOD THINNERS The following drugs contain aspirin or other products, which can cause increased bleeding during surgery and should not be taken for 2 weeks prior to and 1 week after surgery.  If you should need take something for relief of minor pain, you may take acetaminophen which is found in Tylenol,m Datril, Anacin-3 and Panadol. It is not blood thinner. The products listed below are.  Do not take any of the products listed below  in addition to any listed on your instruction sheet.  A.P.C or A.P.C with Codeine Codeine Phosphate Capsules #3 Ibuprofen Ridaura  ABC compound Congesprin Imuran rimadil  Advil Cope Indocin Robaxisal  Alka-Seltzer Effervescent Pain Reliever and Antacid Coricidin or Coricidin-D  Indomethacin Rufen  Alka-Seltzer plus Cold Medicine Cosprin Ketoprofen S-A-C Tablets  Anacin Analgesic Tablets or Capsules Coumadin Korlgesic Salflex  Anacin Extra Strength Analgesic tablets or capsules CP-2 Tablets Lanoril Salicylate  Anaprox Cuprimine Capsules Levenox Salocol  Anexsia-D Dalteparin Magan Salsalate  Anodynos Darvon compound Magnesium Salicylate Sine-off  Ansaid Dasin Capsules Magsal Sodium Salicylate  Anturane Depen Capsules Marnal Soma  APF Arthritis pain formula Dewitt's Pills Measurin Stanback  Argesic Dia-Gesic Meclofenamic Sulfinpyrazone  Arthritis Bayer Timed Release Aspirin Diclofenac Meclomen Sulindac  Arthritis pain formula Anacin Dicumarol Medipren Supac  Analgesic (Safety coated) Arthralgen Diffunasal Mefanamic Suprofen  Arthritis Strength Bufferin Dihydrocodeine Mepro Compound Suprol  Arthropan liquid Dopirydamole Methcarbomol with Aspirin Synalgos  ASA tablets/Enseals Disalcid Micrainin Tagament  Ascriptin Doan's Midol Talwin  Ascriptin A/D Dolene Mobidin Tanderil  Ascriptin Extra Strength Dolobid Moblgesic Ticlid  Ascriptin with Codeine Doloprin or Doloprin with Codeine Momentum Tolectin  Asperbuf Duoprin Mono-gesic Trendar  Aspergum Duradyne Motrin or Motrin IB Triminicin  Aspirin plain, buffered or enteric coated Durasal Myochrisine Trigesic  Aspirin Suppositories Easprin Nalfon Trillsate  Aspirin with Codeine Ecotrin Regular or Extra Strength Naprosyn Uracel  Atromid-S Efficin Naproxen Ursinus  Auranofin Capsules Elmiron Neocylate Vanquish  Axotal Emagrin Norgesic Verin  Azathioprine Empirin or Empirin with Codeine Normiflo Vitamin E  Azolid Emprazil Nuprin Voltaren  Bayer  Aspirin plain, buffered or children's or timed BC Tablets or powders Encaprin Orgaran Warfarin Sodium  Buff-a-Comp Enoxaparin Orudis Zorpin  Buff-a-Comp with Codeine Equegesic Os-Cal-Gesic   Buffaprin Excedrin plain, buffered or Extra Strength Oxalid   Bufferin Arthritis Strength Feldene Oxphenbutazone   Bufferin plain or Extra Strength Feldene Capsules Oxycodone with Aspirin   Bufferin with Codeine Fenoprofen Fenoprofen Pabalate or Pabalate-SF   Buffets II Flogesic Panagesic   Buffinol plain or Extra Strength Florinal or Florinal with Codeine Panwarfarin   Buf-Tabs Flurbiprofen Penicillamine   Butalbital Compound Four-way cold tablets   Penicillin   Butazolidin Fragmin Pepto-Bismol   Carbenicillin Geminisyn Percodan   Carna Arthritis Reliever Geopen Persantine   Carprofen Gold's salt Persistin   Chloramphenicol Goody's Phenylbutazone   Chloromycetin Haltrain Piroxlcam   Clmetidine heparin Plaquenil   Cllnoril Hyco-pap Ponstel   Clofibrate Hydroxy chloroquine Propoxyphen         Before stopping any of these medications, be sure to consult the physician who ordered them.  Some, such as Coumadin (Warfarin) are ordered to prevent or treat serious conditions such as "deep thrombosis", "pumonary embolisms", and other heart problems.  The amount of time that you may need off of the medication may also vary with the medication and the reason for which you were taking it.  If you are taking any of these medications, please make sure you notify your pain physician before you undergo any procedures.         Epidural Steroid Injection Patient Information  Description: The epidural space surrounds the nerves as they exit the spinal cord.  In some patients, the nerves can be compressed and inflamed by a bulging disc or a tight spinal canal (spinal stenosis).  By injecting steroids into the epidural space, we can bring irritated nerves into direct contact with a potentially helpful medication.   These steroids act directly on the irritated nerves and can reduce swelling and inflammation which often leads to decreased pain.  Epidural steroids may be injected anywhere along the spine and from the neck to the low back depending upon the location of your pain.   After numbing the skin with local anesthetic (like Novocaine), a small needle is passed into the epidural space slowly.  You may experience a sensation of pressure while this is being done.  The entire block usually last less than 10 minutes.  Conditions which may be treated by epidural steroids:   Low back and leg pain  Neck and arm pain  Spinal stenosis  Post-laminectomy syndrome  Herpes zoster (shingles) pain  Pain from compression fractures  Preparation for the injection:  1. Do not eat any solid food or dairy products within 8 hours of your appointment.  2. You may drink clear liquids up to 3 hours before appointment.  Clear liquids include water, black coffee, juice or soda.  No milk or cream please. 3. You may take your regular medication, including pain medications, with a sip of water before your appointment  Diabetics should hold regular insulin (if taken separately) and take 1/2 normal NPH dos the morning of the procedure.  Carry some sugar containing items with you to your appointment. 4. A driver must accompany you and be prepared to drive you home after your procedure.  5. Bring all your current medications with your. 6. An IV may be inserted and sedation may be given at the discretion of the physician.   7. A blood pressure cuff, EKG and other monitors will often be applied during the procedure.  Some patients may need to have extra oxygen administered for a short period. 8. You will be asked to provide medical information, including your allergies, prior to the procedure.  We must know immediately if you are taking blood thinners (like Coumadin/Warfarin)  Or if you are allergic to IV iodine contrast (dye). We  must know if you could possible be pregnant.  Possible side-effects:  Bleeding from needle site  Infection (rare, may require surgery)  Nerve injury (rare)  Numbness & tingling (temporary)  Difficulty urinating (rare, temporary)  Spinal headache (   a headache worse with upright posture)  Light -headedness (temporary)  Pain at injection site (several days)  Decreased blood pressure (temporary)  Weakness in arm/leg (temporary)  Pressure sensation in back/neck (temporary)  Call if you experience:  Fever/chills associated with headache or increased back/neck pain.  Headache worsened by an upright position.  New onset weakness or numbness of an extremity below the injection site  Hives or difficulty breathing (go to the emergency room)  Inflammation or drainage at the infection site  Severe back/neck pain  Any new symptoms which are concerning to you  Please note:  Although the local anesthetic injected can often make your back or neck feel good for several hours after the injection, the pain will likely return.  It takes 3-7 days for steroids to work in the epidural space.  You may not notice any pain relief for at least that one week.  If effective, we will often do a series of three injections spaced 3-6 weeks apart to maximally decrease your pain.  After the initial series, we generally will wait several months before considering a repeat injection of the same type.  If you have any questions, please call [phone] [facility] 

## 2017-10-27 NOTE — Progress Notes (Signed)
Subjective:  Patient ID: Tina Hayes, female    DOB: 12-04-1945  Age: 72 y.o. MRN: [id number]  CC: Back Pain (low) and Leg Pain (posterior, bilateral and through to toes)   Procedure: None  HPI Tina Hayes presents for reevaluation.  She was last seen several months ago and continues to have low back pain with worsening radiating pain into the bilateral lower extremities and feet.  She has had this in the past and it has improved with epidural steroid administration.  Her last injection was in January of this year.  She continues to get good relief with these and gradually over the course the next several months has recurrence of her same quality pain.  At present this is severe in nature.  She has used tramadol for this in the past and this is insufficient to give her relief and she is hurting throughout the day.  The main pain is in the low back of his severe aching gnawing nature with radiation into the bilateral buttocks.  She uses ambulatory assistance throughout the day.  She has been unable to lose any significant weight.  Otherwise she is in her usual state of health today.  Outpatient Medications Prior to Visit  Medication Sig Dispense Refill  . ACCU-CHEK AVIVA PLUS test strip     . albuterol (PROVENTIL HFA;VENTOLIN HFA) 108 (90 Base) MCG/ACT inhaler Inhale 2 puffs into the lungs every 6 (six) hours as needed for wheezing or shortness of breath. 1 Inhaler 2  . albuterol (PROVENTIL) (2.5 MG/3ML) 0.083% nebulizer solution Take 2.5 mg every 4 (four) hours as needed by nebulization for wheezing or shortness of breath.    [name] atorvastatin (LIPITOR) 40 MG tablet Take 40 mg at bedtime by mouth.     . Blood Glucose Monitoring Suppl (ACCU-CHEK AVIVA PLUS) w/Device KIT     . Calcium Carbonate-Vitamin D3 (CALCIUM 600-D) 600-400 MG-UNIT TABS Take 1 tablet 2 (two) times daily by mouth.    . carvedilol (COREG) 3.125 MG tablet Take 1 tablet (3.125 mg total) by mouth 2 (two) times daily with a meal.  180 tablet 3  . cetirizine (ZYRTEC) 10 MG tablet Take 10 mg by mouth daily.    . citalopram (CELEXA) 20 MG tablet Take 20 mg by mouth at bedtime.     . clopidogrel (PLAVIX) 75 MG tablet Take 1 tablet (75 mg total) by mouth daily. 30 tablet 0  . esomeprazole (NEXIUM) 40 MG capsule Take 40 mg daily at 12 noon by mouth.    . ferrous sulfate 325 (65 FE) MG tablet Take 325 mg 3 (three) times daily with meals by mouth.    . insulin aspart (NOVOLOG) 100 UNIT/ML injection Inject 5-10 Units 2 (two) times daily with a meal into the skin. Take 10 units in the morning and 5 units in the evening with meal    . insulin glargine (LANTUS) 100 UNIT/ML injection Inject 30-40 Units 2 (two) times daily into the skin. Inject 30 units in the morning at 40 units at bedtime.    . isosorbide mononitrate (IMDUR) 30 MG 24 hr tablet Take 1 tablet (30 mg total) by mouth 2 (two) times daily. 180 tablet 3  . levETIRAcetam (KEPPRA) 750 MG tablet Take 1 tablet (750 mg total) by mouth 2 (two) times daily. 60 tablet 3  . losartan (COZAAR) 100 MG tablet Take 50 mg by mouth daily.    . meloxicam (MOBIC) 7.5 MG tablet Take 7.5 mg daily by mouth.    [name]  nitroGLYCERIN (NITROSTAT) 0.4 MG SL tablet DISSOLVE ONE TABLET UNDER THE TONGUE EVERY 5 MINUTES AS NEEDED FOR CHEST PAIN.  DO NOT EXCEED A TOTAL OF 3 DOSES IN 15 MINUTES 25 tablet 0  . potassium chloride SA (K-DUR,KLOR-CON) 20 MEQ tablet Take 2 tablets (40 mEq total) by mouth 3 (three) times daily. 180 tablet 5  . tiotropium (SPIRIVA) 18 MCG inhalation capsule Place 18 mcg into inhaler and inhale daily.    [name] torsemide (DEMADEX) 20 MG tablet Take 3 tablets (60 mg total) by mouth daily. 90 tablet 5  . traMADol (ULTRAM) 50 MG tablet Take 2 tablets (100 mg total) by mouth 4 (four) times daily. 240 tablet 2  . traZODone (DESYREL) 50 MG tablet Take 50 mg by mouth at bedtime.    [name] UNIFINE PENTIPS 31G X 6 MM MISC     . dextromethorphan (DELSYM) 30 MG/5ML liquid Take 15 mg every 12 (twelve) hours  as needed by mouth for cough.    . predniSONE (STERAPRED UNI-PAK 21 TAB) 10 MG (21) TBPK tablet 6 tabs day 1 and taper 10 mg a day - 6 days (Patient not taking: Reported on 10/26/2017) 21 tablet 0   No facility-administered medications prior to visit.     Review of Systems CNS: No confusion or sedation Cardiac: No angina or palpitations GI: No abdominal pain or constipation Constitutional: No nausea vomiting fevers or chills  Objective:  BP 131/63   Pulse 69   Temp 98.1 F (36.7 C) (Oral)   Resp 18   Ht _0  (1.803 m)   Wt (!) 312 lb (141.5 kg)   SpO2 96%   BMI 43.52 kg/m    BP Readings from Last 3 Encounters:  10/26/17 131/63  09/07/17 122/80  08/17/17 (!) 159/61     Wt Readings from Last 3 Encounters:  10/26/17 (!) 312 lb (141.5 kg)  09/07/17 (!) 316 lb 4 oz (143.5 kg)  08/16/17 (!) 318 lb 11.2 oz (144.6 kg)     Physical Exam Pt is alert and oriented PERRL EOMI HEART IS RRR no murmur or rub LCTA no wheezing or rales MUSCULOSKELETAL reveals bilateral paraspinous muscle tenderness.  Her muscle tone and bulk to the lower extremities is good/she does have a positive straight leg raise bilaterally Labs  Lab Results  Component Value Date   HGBA1C 7.7 (H) 08/16/2017   HGBA1C 8.5 (H) 01/29/2017   HGBA1C 8.5 (H) 06/17/2016   Lab Results  Component Value Date   LDLCALC 58 01/30/2017   CREATININE 1.04 (H) 09/25/2017    -------------------------------------------------------------------------------------------------------------------- Lab Results  Component Value Date   WBC 7.0 08/16/2017   HGB 9.7 (L) 08/16/2017   HCT 29.8 (L) 08/16/2017   PLT 212 08/16/2017   GLUCOSE 147 (H) 09/25/2017   CHOL 120 01/30/2017   TRIG 73 01/30/2017   HDL 47 01/30/2017   LDLCALC 58 01/30/2017   ALT 15 08/14/2016   AST 18 08/14/2016   NA 139 09/25/2017   K 3.9 09/25/2017   CL 103 09/25/2017   CREATININE 1.04 (H) 09/25/2017   BUN 26 (H) 09/25/2017   CO2 26 09/25/2017    INR 0.99 11/13/2016   HGBA1C 7.7 (H) 08/16/2017    --------------------------------------------------------------------------------------------------------------------- Dg Esophagus  Result Date: 10/13/2017 CLINICAL DATA:  Reflux.  Vomiting.  Prior esophageal stretching. EXAM: ESOPHOGRAM/BARIUM SWALLOW TECHNIQUE: Single contrast examination was performed using  thin barium. FLUOROSCOPY TIME:  Fluoroscopy Time:  0 minutes 24 seconds Radiation Exposure Index (if provided by the fluoroscopic device):  26.3 mGy Number of Acquired Spot Images: 9 COMPARISON:  Chest x-ray 08/15/2017. 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 The esophagus is widely patent. No obstructing abnormality. A large sliding hiatal hernia is present. Associated moderate gastroesophageal reflux. IMPRESSION: 1.  Presbyesophagus.  No obstructing abnormality. 2. Large sliding hiatal hernia with moderate gastroesophageal reflux. Electronically Signed   By: Marcello Moores  Register   On: 10/13/2017 09:45     Assessment & Plan:   Jaanai was seen today for back pain and leg pain.  Diagnoses and all orders for this visit:  Bilateral sciatica -     Lumbar Epidural Injection; Future -     MR LUMBAR SPINE WO CONTRAST; Future  Facet arthritis of lumbosacral region  Primary osteoarthritis of both knees  DDD (degenerative disc disease), lumbar -     Lumbar Epidural Injection; Future  Spinal stenosis of lumbar region with neurogenic claudication -     Lumbar Epidural Injection; Future -     MR LUMBAR SPINE WO CONTRAST; Future  Chronic pain syndrome -     ToxASSURE Select 13 (MW), Urine  Chronic, continuous use of opioids -     ToxASSURE Select 13 (MW), Urine  Other orders -     HYDROcodone-acetaminophen (NORCO/VICODIN) 5-325 MG tablet; Take 1 tablet by mouth 2 (two) times  daily.        ----------------------------------------------------------------------------------------------------------------------  Problem List Items Addressed This Visit    None    Visit Diagnoses    Bilateral sciatica    -  Primary   Relevant Orders   Lumbar Epidural Injection   MR LUMBAR SPINE WO CONTRAST   Facet arthritis of lumbosacral region       Relevant Medications   HYDROcodone-acetaminophen (NORCO/VICODIN) 5-325 MG tablet   Primary osteoarthritis of both knees       Relevant Medications   HYDROcodone-acetaminophen (NORCO/VICODIN) 5-325 MG tablet   DDD (degenerative disc disease), lumbar       Relevant Medications   HYDROcodone-acetaminophen (NORCO/VICODIN) 5-325 MG tablet   Other Relevant Orders   Lumbar Epidural Injection   Spinal stenosis of lumbar region with neurogenic claudication       Relevant Medications   HYDROcodone-acetaminophen (NORCO/VICODIN) 5-325 MG tablet   Other Relevant Orders   Lumbar Epidural Injection   MR LUMBAR SPINE WO CONTRAST   Chronic pain syndrome       Relevant Orders   ToxASSURE Select 13 (MW), Urine   Chronic, continuous use of opioids       Relevant Orders   ToxASSURE Select 13 (MW), Urine        ----------------------------------------------------------------------------------------------------------------------  1. Bilateral sciatica Secondary to the severity of her pain and reports of increased weakness affecting the legs I am going to request an MRI for further evaluation of lumbar pathology.  She has responded favorably to epidurals in the past and we will schedule her for a lumbar epidural steroid next visit approximately 1 month. - Lumbar Epidural Injection; Future - MR LUMBAR SPINE WO CONTRAST; Future  2. Facet arthritis of lumbosacral region As above  3. Primary osteoarthritis of both knees Continue efforts at weight loss and core strengthening  4. DDD (degenerative disc disease), lumbar As above -  Lumbar Epidural Injection; Future  5. Spinal stenosis of lumbar region with neurogenic claudication As above. - Lumbar Epidural Injection; Future - MR LUMBAR SPINE WO CONTRAST; Future  6. Chronic pain syndrome We will check a UDS today.  I am also going to start her  on a low dose of Vicodin twice a day to see if we can get her some relief of the severe pain she is written reporting.  She is to discontinue her tramadol at this point. - ToxASSURE Select 13 (MW), Urine  7. Chronic, continuous use of opioids We have reviewed the [facility] practitioner database information and it is appropriate. - ToxASSURE Select 13 (MW), Urine    ----------------------------------------------------------------------------------------------------------------------  I am having Velora Heckler. Whelchel start on HYDROcodone-acetaminophen. I am also having her maintain her tiotropium, levETIRAcetam, traZODone, albuterol, cetirizine, citalopram, ACCU-CHEK AVIVA PLUS, ACCU-CHEK AVIVA PLUS, UNIFINE PENTIPS, insulin aspart, carvedilol, isosorbide mononitrate, clopidogrel, atorvastatin, insulin glargine, esomeprazole, dextromethorphan, Calcium Carbonate-Vitamin D3, ferrous sulfate, albuterol, meloxicam, traMADol, nitroGLYCERIN, losartan, predniSONE, potassium chloride SA, and torsemide.   Meds ordered this encounter  Medications  . HYDROcodone-acetaminophen (NORCO/VICODIN) 5-325 MG tablet    Sig: Take 1 tablet by mouth 2 (two) times daily.    Dispense:  60 tablet    Refill:  0   Patient's Medications  New Prescriptions   HYDROCODONE-ACETAMINOPHEN (NORCO/VICODIN) 5-325 MG TABLET    Take 1 tablet by mouth 2 (two) times daily.  Previous Medications   ACCU-CHEK AVIVA PLUS TEST STRIP       ALBUTEROL (PROVENTIL HFA;VENTOLIN HFA) 108 (90 BASE) MCG/ACT INHALER    Inhale 2 puffs into the lungs every 6 (six) hours as needed for wheezing or shortness of breath.   ALBUTEROL (PROVENTIL) (2.5 MG/3ML) 0.083% NEBULIZER SOLUTION     Take 2.5 mg every 4 (four) hours as needed by nebulization for wheezing or shortness of breath.   ATORVASTATIN (LIPITOR) 40 MG TABLET    Take 40 mg at bedtime by mouth.    BLOOD GLUCOSE MONITORING SUPPL (ACCU-CHEK AVIVA PLUS) W/DEVICE KIT       CALCIUM CARBONATE-VITAMIN D3 (CALCIUM 600-D) 600-400 MG-UNIT TABS    Take 1 tablet 2 (two) times daily by mouth.   CARVEDILOL (COREG) 3.125 MG TABLET    Take 1 tablet (3.125 mg total) by mouth 2 (two) times daily with a meal.   CETIRIZINE (ZYRTEC) 10 MG TABLET    Take 10 mg by mouth daily.   CITALOPRAM (CELEXA) 20 MG TABLET    Take 20 mg by mouth at bedtime.    CLOPIDOGREL (PLAVIX) 75 MG TABLET    Take 1 tablet (75 mg total) by mouth daily.   DEXTROMETHORPHAN (DELSYM) 30 MG/5ML LIQUID    Take 15 mg every 12 (twelve) hours as needed by mouth for cough.   ESOMEPRAZOLE (NEXIUM) 40 MG CAPSULE    Take 40 mg daily at 12 noon by mouth.   FERROUS SULFATE 325 (65 FE) MG TABLET    Take 325 mg 3 (three) times daily with meals by mouth.   INSULIN ASPART (NOVOLOG) 100 UNIT/ML INJECTION    Inject 5-10 Units 2 (two) times daily with a meal into the skin. Take 10 units in the morning and 5 units in the evening with meal   INSULIN GLARGINE (LANTUS) 100 UNIT/ML INJECTION    Inject 30-40 Units 2 (two) times daily into the skin. Inject 30 units in the morning at 40 units at bedtime.   ISOSORBIDE MONONITRATE (IMDUR) 30 MG 24 HR TABLET    Take 1 tablet (30 mg total) by mouth 2 (two) times daily.   LEVETIRACETAM (KEPPRA) 750 MG TABLET    Take 1 tablet (750 mg total) by mouth 2 (two) times daily.   LOSARTAN (COZAAR) 100 MG TABLET    Take 50  mg by mouth daily.   MELOXICAM (MOBIC) 7.5 MG TABLET    Take 7.5 mg daily by mouth.   NITROGLYCERIN (NITROSTAT) 0.4 MG SL TABLET    DISSOLVE ONE TABLET UNDER THE TONGUE EVERY 5 MINUTES AS NEEDED FOR CHEST PAIN.  DO NOT EXCEED A TOTAL OF 3 DOSES IN 15 MINUTES   POTASSIUM CHLORIDE SA (K-DUR,KLOR-CON) 20 MEQ TABLET    Take 2 tablets (40 mEq total)  by mouth 3 (three) times daily.   PREDNISONE (STERAPRED UNI-PAK 21 TAB) 10 MG (21) TBPK TABLET    6 tabs day 1 and taper 10 mg a day - 6 days   TIOTROPIUM (SPIRIVA) 18 MCG INHALATION CAPSULE    Place 18 mcg into inhaler and inhale daily.   TORSEMIDE (DEMADEX) 20 MG TABLET    Take 3 tablets (60 mg total) by mouth daily.   TRAMADOL (ULTRAM) 50 MG TABLET    Take 2 tablets (100 mg total) by mouth 4 (four) times daily.   TRAZODONE (DESYREL) 50 MG TABLET    Take 50 mg by mouth at bedtime.   UNIFINE PENTIPS 31G X 6 MM MISC      Modified Medications   No medications on file  Discontinued Medications   No medications on file   ----------------------------------------------------------------------------------------------------------------------  Follow-up: Return in about 1 month (around 11/26/2017) for procedure.    Molli Barrows, MD

## 2017-10-31 LAB — TOXASSURE SELECT 13 (MW), URINE

## 2017-11-04 ENCOUNTER — Encounter: Payer: Self-pay | Admitting: Surgery

## 2017-11-04 ENCOUNTER — Ambulatory Visit: Payer: Medicare HMO | Admitting: Cardiovascular Disease

## 2017-11-04 ENCOUNTER — Ambulatory Visit (INDEPENDENT_AMBULATORY_CARE_PROVIDER_SITE_OTHER): Payer: Medicare HMO | Admitting: Surgery

## 2017-11-04 ENCOUNTER — Telehealth: Payer: Self-pay | Admitting: Cardiovascular Disease

## 2017-11-04 VITALS — BP 148/53 | HR 174 | Temp 97.6°F | Ht 71.0 in | Wt 321.2 lb

## 2017-11-04 DIAGNOSIS — R2241 Localized swelling, mass and lump, right lower limb: Secondary | ICD-10-CM

## 2017-11-04 NOTE — Progress Notes (Signed)
11/04/2017  Reason for Visit:  Right lower leg mass  Referring Provider:  Alwyn Pea, NP  History of Present Illness: Tina Hayes is a 72 y.o. female referred for evaluation of a right lower leg mass.  This is located at the distal lower right leg, anteromedially, proximal to the ankle.  She had had this mass for 6-9 months at least, and noted that it initially started as a pimple size, and now has grown over the months.  As it has grown, it has become more symptomatic, and the patient reports pain with ambulation and pain when putting weight on the right foot, and needs a cane for support. She denies any skin discoloration, erythema, or drainage from the area.  She was seen at [facility] by Dr. Sabra Heck, who recommended excision, but at a tertiary care center and recommended [facility] for the procedure.  She was referred to general surgery here for another opinion.    Of note, she also has had a right knee replacement and has arthritis of the left knee, as well as other medical comorbidities including CHF, DM, CAD with a CABG in 2006, HTN.  She also reports that she's been having worse shortness of breath recently and is seeing her doctor next week.   Past Medical History: Past Medical History:  Diagnosis Date  . (HFpEF) heart failure with preserved ejection fraction ([place])    a. 05/2016 Echo: EF 60-65%, mild to mod LVH, Gr1 DD, mild MR, mildly dil LA, mod TR, mildly to mod increased PASP.  [name] Acute diastolic heart failure ([place]) 01/27/2017  . Anxiety   . Chest pain 06/16/2016  . CHF (congestive heart failure) ([place])   . Chronic back pain   . Chronic diastolic congestive heart failure ([name]) 02/13/2017  . COPD (chronic obstructive pulmonary disease) ([place])   . Coronary artery disease    a. s/p remote PCI x 5;  b. 2006 s/p CABG x 3 ([place], [place]); b. 05/2016 MV: attenuation corrected images w/o ischemia or wma-->Med rx.  . Coronary artery disease of native artery of  native heart with stable angina pectoris ([place]) 06/17/2016  . Depression   . Diabetes mellitus without complication ([place])   . Essential hypertension 06/30/2016  . Heart attack ([place])    Total of 3 per pt.  . Hypertension   . Hypertensive urgency 06/03/2015  . Seizures ([place])      Past Surgical History: Past Surgical History:  Procedure Laterality Date  . ANKLE SURGERY    . COLONOSCOPY WITH PROPOFOL N/A 05/04/2017   Procedure: COLONOSCOPY WITH PROPOFOL;  Surgeon: [name], MD;  Location: [facility] ENDOSCOPY;  Service: Endoscopy;  Laterality: N/A;  . CORONARY ANGIOPLASTY    . CORONARY ARTERY BYPASS GRAFT    . ESOPHAGOGASTRODUODENOSCOPY (EGD) WITH PROPOFOL N/A 05/04/2017   Procedure: ESOPHAGOGASTRODUODENOSCOPY (EGD) WITH PROPOFOL;  Surgeon: [name], MD;  Location: [facility] ENDOSCOPY;  Service: Endoscopy;  Laterality: N/A;  . FRACTURE SURGERY    . KNEE SURGERY    . MOUTH SURGERY Left 07/14/2017    Home Medications: Prior to Admission medications   Medication Sig Start Date End Date Taking? Authorizing Provider  ACCU-CHEK AVIVA PLUS test strip  05/19/16   [provider]  albuterol (PROVENTIL HFA;VENTOLIN HFA) 108 (90 Base) MCG/ACT inhaler Inhale 2 puffs into the lungs every 6 (six) hours as needed for wheezing or shortness of breath. 08/30/15   Epifanio Lesches, MD  albuterol (PROVENTIL) (2.5 MG/3ML) 0.083% nebulizer solution Take  2.5 mg every 4 (four) hours as needed by nebulization for wheezing or shortness of breath.    [provider]  amoxicillin (AMOXIL) 875 MG tablet TAKE 1 TABLET BY MOUTH EVERY 12 HOURS UNTIL GONE 10/26/17   [provider]  atorvastatin (LIPITOR) 40 MG tablet Take 40 mg at bedtime by mouth.     [provider]  Blood Glucose Monitoring Suppl (ACCU-CHEK AVIVA PLUS) w/Device KIT  06/13/16   [provider]  Calcium Carbonate-Vitamin D3 (CALCIUM 600-D) 600-400 MG-UNIT TABS Take 1 tablet 2 (two) times daily by mouth.     [provider]  carvedilol (COREG) 3.125 MG tablet Take 1 tablet (3.125 mg total) by mouth 2 (two) times daily with a meal. 08/12/16   Alisa Graff, FNP  cetirizine (ZYRTEC) 10 MG tablet Take 10 mg by mouth daily.    [provider]  citalopram (CELEXA) 20 MG tablet Take 20 mg by mouth at bedtime.     [provider]  clopidogrel (PLAVIX) 75 MG tablet Take 1 tablet (75 mg total) by mouth daily. 08/14/16   Minna Merritts, MD  dextromethorphan (DELSYM) 30 MG/5ML liquid Take 15 mg every 12 (twelve) hours as needed by mouth for cough.    [provider]  esomeprazole (NEXIUM) 40 MG capsule Take 40 mg daily at 12 noon by mouth.    [provider]  ferrous sulfate 325 (65 FE) MG tablet Take 325 mg 3 (three) times daily with meals by mouth.    [provider]  HYDROcodone-acetaminophen (NORCO/VICODIN) 5-325 MG tablet Take 1 tablet by mouth 2 (two) times daily. 10/26/17   Molli Barrows, MD  insulin aspart (NOVOLOG) 100 UNIT/ML injection Inject 5-10 Units 2 (two) times daily with a meal into the skin. Take 10 units in the morning and 5 units in the evening with meal    [provider]  insulin glargine (LANTUS) 100 UNIT/ML injection Inject 30-40 Units 2 (two) times daily into the skin. Inject 30 units in the morning at 40 units at bedtime.    [provider]  isosorbide mononitrate (IMDUR) 30 MG 24 hr tablet Take 1 tablet (30 mg total) by mouth 2 (two) times daily. 08/12/16   Alisa Graff, FNP  levETIRAcetam (KEPPRA) 750 MG tablet Take 1 tablet (750 mg total) by mouth 2 (two) times daily. 06/05/15   Rai, Vernelle Emerald, MD  losartan (COZAAR) 100 MG tablet Take 50 mg by mouth daily.    [provider]  meloxicam (MOBIC) 7.5 MG tablet Take 7.5 mg daily by mouth.    [provider]  nitroGLYCERIN (NITROSTAT) 0.4 MG SL tablet DISSOLVE ONE TABLET UNDER THE TONGUE EVERY 5 MINUTES AS NEEDED FOR CHEST PAIN.  DO NOT EXCEED A TOTAL  OF 3 DOSES IN 15 MINUTES 07/30/17   Gollan, Kathlene November, MD  potassium chloride SA (K-DUR,KLOR-CON) 20 MEQ tablet Take 2 tablets (40 mEq total) by mouth 3 (three) times daily. 09/18/17 12/17/17  Alisa Graff, FNP  predniSONE (STERAPRED UNI-PAK 21 TAB) 10 MG (21) TBPK tablet 6 tabs day 1 and taper 10 mg a day - 6 days Patient not taking: Reported on 10/26/2017 08/17/17   Hillary Bow, MD  tiotropium (SPIRIVA) 18 MCG inhalation capsule Place 18 mcg into inhaler and inhale daily.    [provider]  torsemide (DEMADEX) 20 MG tablet Take 3 tablets (60 mg total) by mouth daily. 09/29/17 10/29/17  Alisa Graff, FNP  traMADol Veatrice Bourbon)  50 MG tablet Take 2 tablets (100 mg total) by mouth 4 (four) times daily. 04/24/17   Molli Barrows, MD  traZODone (DESYREL) 50 MG tablet Take 50 mg by mouth at bedtime.    [provider]  UNIFINE PENTIPS 31G X 6 MM [place]  07/23/16   [provider]    Allergies: Allergies  Allergen Reactions  . Aspirin Anaphylaxis    Social History:  reports that she quit smoking about 11 years ago. Her smoking use included cigarettes. She has a 5.00 pack-year smoking history. She has never used smokeless tobacco. She reports that she does not drink alcohol or use drugs.   Family History: Family History  Problem Relation Age of Onset  . Diabetes Unknown   . Hypertension Unknown   . Diabetes Mother   . Heart failure Mother   . Heart disease Mother   . Heart attack Mother   . Stroke Mother   . Depression Mother   . Hypertension Mother   . Cancer Sister        brain  . Hypertension Sister   . Diabetes Brother   . Hypertension Brother   . Heart failure Sister   . Heart attack Sister   . SIDS Sister     Review of Systems: Review of Systems  Constitutional: Negative for chills and fever.  HENT: Negative for hearing loss.   Respiratory: Positive for shortness of breath.   Cardiovascular: Negative for chest pain.  Gastrointestinal: Negative for  abdominal pain, nausea and vomiting.  Genitourinary: Negative for dysuria.  Musculoskeletal: Positive for joint pain.  Skin: Negative for rash.  Neurological: Negative for dizziness.  Psychiatric/Behavioral: Negative for depression.    Physical Exam BP (!) 148/53   Pulse (!) 174   Temp 97.6 F (36.4 C) (Oral)   Ht '5\' 11"'  (1.803 m)   Wt (!) 321 lb 3.2 oz (145.7 kg)   BMI 44.80 kg/m  CONSTITUTIONAL: No acute distress HEENT:  Normocephalic, atraumatic, extraocular motion intact. NECK: Trachea is midline, and there is no jugular venous distension.  RESPIRATORY:  Lungs are clear, and breath sounds are equal bilaterally. Normal respiratory effort without pathologic use of accessory muscles. CARDIOVASCULAR: Heart is regular without murmurs, gallops, or rubs. GI: The abdomen is obese, nondistended, nontender.  MUSCULOSKELETAL:   No peripheral edema or cyanosis.  There is a 2-3 cm mass anteromedial distal right lower leg, just proximal to the ankle joint, which is tender to palpation.  She has tenderness with plantar and dorsi flexion against resistance, and with axial pressure on her calcaneuos against the ankle.   SKIN: Skin turgor is normal. There are no pathologic skin lesions.  NEUROLOGIC:  No motor or sensory deficits at the right lower leg. PSYCH:  Alert and oriented to person, place and time. Affect is normal.  Laboratory Analysis: No results found for this or any previous visit (from the past 24 hour(s)).  Imaging: U/S 07/01/17: Palpable concern corresponds with a complex, solid and cystic mass without internal blood flow. Appearance is nonspecific. Further evaluation with MRI without and with contrast recommended to exclude neoplasm.  MRI 08/18/17 Subcutaneous nodule medially in the distal lower leg is well-circumscribed on the T1 weighted images and demonstrates homogeneous low T1 signal. There is heterogeneous T2 hyperintensity. There is no resulting osseous erosion. Lesion  measures approximately 3.4 x 2.7 x 1.9 cm, similar to previous ultrasound. The nodule is incompletely characterized without contrast.  Assessment and Plan: This is a 72 y.o. female  who presents with a symptomatic right lower leg mass.  I have independently viewed the patient's imaging studies and discussed them with the patient.  Reviewing the MRI, the mass appears to be anteromedial in location, and in the vicinity of the saphenous vein and nerve as well.  Though the size is not large, I think given the complexity this would be better done under general anesthesia.  And given her multiple comorbidities, I would agree with Dr. Sabra Heck that referring her to a tertiary center would be better for her.  We will send a referral to [facility] for further evaluation.  She also knows that she needs to follow up with Dr. Rockey Situ next week to evaluate her shortness of breath.   The patient understands this plan and she's happy with the decision and thought process.  She knows to call us back if she has not heard from [name] within a week.  Face-to-face time spent with the patient and care providers was 60 minutes, with more than 50% of the time spent counseling, educating, and coordinating care of the patient.     Melvyn Neth, [facility]

## 2017-11-04 NOTE — Patient Instructions (Addendum)
We will send the referral to [facility]  Surgeon. Someone from their office should contact you within 5-7 days to schedule an appointment. If you do not hear from anyone within the time frame listed above please call our office and let us know.

## 2017-11-04 NOTE — Telephone Encounter (Signed)
Called patient. Patient complaint of 10-13 pound weight gain since last week. Her scales broke last Tuesday 10/27/17 at which that day she weighted 303lb at home. Today at general surgery appointment, she weighed 320lb on their scales. She has been feeling increasingly short of breath with minimal exertion.  She feel swelling in her stomach, arms and legs.  New to her is having her pills in pill packs. She does not trust that they are right so she hasn't been taking all the pills in them regularly. Advised patient she should take all the pills in the packs as she may not be getting the medications she needs. Offered patient an appointment with Dr Rockey Situ today; however, patient has to use transportation system and they need a 3 day notice and she has no way to get here today. She is scheduled to see Dr Rockey Situ on 11/09/17 and advised patient to keep that appointment.  Patient went to the Heart Failure clinic today and was given a new scale. Advised patient to take an extra dose of torsemide 60 mg now and weigh herself in the morning and call us with the readings to see if she's made progress. Pt verbalized understanding to call 911 or go to the emergency room, if he develops any new or worsening symptoms. Routing to Dr Rockey Situ for review.

## 2017-11-04 NOTE — Telephone Encounter (Signed)
Pt c/o swelling: STAT is pt has developed SOB within 24 hours  1) How much weight have you gained and in what time span?  10-13 lbs less than a week   2) If swelling, where is the swelling located?  All over especially in stomach and legs   3) Are you currently taking a fluid pill?  Yes   4) Are you currently SOB? On exertion  No energy tired   5) Do you have a log of your daily weights (if so, list)?  Yes has at home 303 last week in office 320   6) Have you gained 3 pounds in a day or 5 pounds in a week?  Yes   7) Have you traveled recently? No

## 2017-11-06 MED ORDER — POTASSIUM CHLORIDE CRYS ER 20 MEQ PO TBCR: 40.0000 meq | EXTENDED_RELEASE_TABLET | Freq: Three times a day (TID) | ORAL | Status: DC

## 2017-11-06 MED ORDER — TORSEMIDE 20 MG PO TABS: 60.0000 mg | ORAL_TABLET | Freq: Two times a day (BID) | ORAL | Status: DC

## 2017-11-06 MED ORDER — METOLAZONE 2.5 MG PO TABS: ORAL_TABLET | ORAL | 0 refills | Status: DC

## 2017-11-06 NOTE — Telephone Encounter (Signed)
Reviewed the patient's symptoms with Dr. Rockey Situ. Orders received to have the patient:  1) Continue torsemide 60 mg BID 2) Take metolazone 2.5 mg once daily as needed for weight > 320 lbs. 3) take 1st dose of metolazone tonight 4) take an extra 2 tablets (40 meq) of potassium on the days she is taking metolazone  I have called and notified the patient of Dr. Donivan Scull recommendations. The patient is agreeable and verbalizes understanding.  RX for metolazone sent to the [facility] on [address]. The patient states she will be able to pick this up tonight.   The patient has a follow up appointment on 7/15 with Dr. Rockey Situ.  BMP may need to be repeated at this appointment.

## 2017-11-06 NOTE — Telephone Encounter (Signed)
Pt is returning the call from this morning.

## 2017-11-06 NOTE — Telephone Encounter (Signed)
I called and spoke with the patient. She states that she has been taking an increased dose of torsemide 60 mg BID - today is the 3rd day.  Her weight at home on Monday was 321 lbs and today she is 324 lbs. She has not been urinating any more than usual and therefore has been drinking more water to try to urinate.  She is having increased edema to her abdomen/ legs/ & arms.  Her breathing is more labored with movement.  She typically sleeps on 3 pillows, but has had to increase this to 4 pillows this week.  She is not resting well. Her voice sounds somewhat strained and I inquired if this is usual for her and she states it is not normal.  I advised her I will need to review with the provider and call her back. She is agreeable.

## 2017-11-07 NOTE — Progress Notes (Signed)
Cardiology Office Note  Date:  11/09/2017   ID:  Tina Hayes, Tina Hayes 05-22-1945, MRN [id number]  PCP:  Herminio Commons, MD   Chief Complaint  Patient presents with  . Other    6 month follow up. Meds reviewed by the pt. verbally. Pt. c/o shortness of breath and chest pain.     HPI:   72 y.o. 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, 24 to 72 yo  Chronic back pain,   HTN  seizure disorder  hospital admission 06/17/2016 for cough, chest pain, shortness of breath,  stress test showing no ischemia  felt to be atypical in nature, pain at rest, reproducible on palpation who presents  for follow-up of her chest pain, chronic diastolic CHf  We received phone calls end of last week concerning massive weight gain up to more than 320 pounds Some noncompliance with her torsemide We've recommended she stay compliant with her torsemide 60 twice a day and was given metolazone 2.5 mg over the weekend Reports that she took metolazone 3 doses over 3 days with more than 10 pound weight loss Feels back to her baseline Today down to 311 pounds  Had seizures, x3 on Saturday,  Got hot, stressing Feels very tired on today's visit Managed by neurology  Shortness of breath stable For COPD has nebs, albuterol and spiriva  Lab work Total chol 99, LDL 40 HBA1C 8.5, (up to 9 recently)  EKG personally reviewed by myself on todays visit Shows normal sinus rhythm with rate 62 bpm nonspecific ST abnormality anterolateral leads unable to exclude old inferior MI  Other past medical history reviewed with her  hospital October 2018 for chest pain shortness of breath Felt to be atypical in nature, GI related, piece of meat stuck in her throat with pain radiating to both shoulders, .  Vomiting, unable to get any food or water down  Eventually piece of meat removed and she was  able to swallow  history of GERD, she had ran out of her PPI   PMH:   has a past medical history of (HFpEF) heart failure with preserved ejection fraction (HCC), Acute diastolic heart failure ([place]) (01/27/2017), Anxiety, Chest pain (06/16/2016), CHF (congestive heart failure) ([place]), Chronic back pain, Chronic diastolic congestive heart failure ([place]) (02/13/2017), COPD (chronic obstructive pulmonary disease) ([place]), Coronary artery disease, Coronary artery disease of native artery of native heart with stable angina pectoris ([place]) (06/17/2016), Depression, Diabetes mellitus without complication ([place]), Essential hypertension (06/30/2016), Heart attack ([place]), Hypertension, Hypertensive urgency (06/03/2015), and Seizures ([place]).  PSH:    Past Surgical History:  Procedure Laterality Date  . ANKLE SURGERY    . COLONOSCOPY WITH PROPOFOL N/A 05/04/2017   Procedure: COLONOSCOPY WITH PROPOFOL;  Surgeon: [name], MD;  Location: [facility] ENDOSCOPY;  Service: Endoscopy;  Laterality: N/A;  . CORONARY ANGIOPLASTY    . CORONARY ARTERY BYPASS GRAFT    . ESOPHAGOGASTRODUODENOSCOPY (EGD) WITH PROPOFOL N/A 05/04/2017   Procedure: ESOPHAGOGASTRODUODENOSCOPY (EGD) WITH PROPOFOL;  Surgeon: [name], MD;  Location: [facility] ENDOSCOPY;  Service: Endoscopy;  Laterality: N/A;  . FRACTURE SURGERY    . KNEE SURGERY    . MOUTH SURGERY Left 07/14/2017    Current Outpatient Medications  Medication Sig Dispense Refill  . ACCU-CHEK AVIVA PLUS test strip     . albuterol (PROVENTIL HFA;VENTOLIN HFA) 108 (  90 Base) MCG/ACT inhaler Inhale 2 puffs into the lungs every 6 (six) hours as needed for wheezing or shortness of breath. 1 Inhaler 2  . albuterol (PROVENTIL) (2.5 MG/3ML) 0.083% nebulizer solution Take 2.5 mg every 4 (four) hours as needed by nebulization for wheezing or shortness of breath.    [name] atorvastatin (LIPITOR) 40 MG tablet Take 40 mg at bedtime by mouth.     . Blood Glucose Monitoring Suppl (ACCU-CHEK AVIVA PLUS)  w/Device KIT     . Calcium Carbonate-Vitamin D3 (CALCIUM 600-D) 600-400 MG-UNIT TABS Take 1 tablet 2 (two) times daily by mouth.    . carvedilol (COREG) 3.125 MG tablet Take 1 tablet (3.125 mg total) by mouth 2 (two) times daily with a meal. 180 tablet 3  . cetirizine (ZYRTEC) 10 MG tablet Take 10 mg by mouth daily.    . citalopram (CELEXA) 20 MG tablet Take 20 mg by mouth at bedtime.     . clopidogrel (PLAVIX) 75 MG tablet Take 1 tablet (75 mg total) by mouth daily. 30 tablet 0  . esomeprazole (NEXIUM) 40 MG capsule Take 40 mg daily at 12 noon by mouth.    . ferrous sulfate 325 (65 FE) MG tablet Take 325 mg 3 (three) times daily with meals by mouth.    [name] HYDROcodone-acetaminophen (NORCO/VICODIN) 5-325 MG tablet Take 1 tablet by mouth 2 (two) times daily. 60 tablet 0  . insulin aspart (NOVOLOG) 100 UNIT/ML injection Inject 5-10 Units 2 (two) times daily with a meal into the skin. Take 10 units in the morning and 5 units in the evening with meal    . insulin glargine (LANTUS) 100 UNIT/ML injection Inject 30-40 Units 2 (two) times daily into the skin. Inject 30 units in the morning at 40 units at bedtime.    . isosorbide mononitrate (IMDUR) 30 MG 24 hr tablet Take 1 tablet (30 mg total) by mouth 2 (two) times daily. 180 tablet 3  . levETIRAcetam (KEPPRA) 750 MG tablet Take 1 tablet (750 mg total) by mouth 2 (two) times daily. 60 tablet 3  . losartan (COZAAR) 100 MG tablet Take 50 mg by mouth daily.    . meloxicam (MOBIC) 7.5 MG tablet Take 7.5 mg daily by mouth.    . metolazone (ZAROXOLYN) 2.5 MG tablet Take 1 tablet (2.5 mg) by mouth once daily as needed for a weigh > 320 lbs, take 30 minutes prior to you first dose of torsemide 30 tablet 0  . nitroGLYCERIN (NITROSTAT) 0.4 MG SL tablet DISSOLVE ONE TABLET UNDER THE TONGUE EVERY 5 MINUTES AS NEEDED FOR CHEST PAIN.  DO NOT EXCEED A TOTAL OF 3 DOSES IN 15 MINUTES 25 tablet 0  . potassium chloride SA (K-DUR,KLOR-CON) 20 MEQ tablet Take 2 tablets (40 mEq  total) by mouth 3 (three) times daily. Take an extra 2 tablets (40 meq) by mouth on the days you take metolazone    . tiotropium (SPIRIVA) 18 MCG inhalation capsule Place 18 mcg into inhaler and inhale daily.    [name] torsemide (DEMADEX) 20 MG tablet Take 3 tablets (60 mg total) by mouth 2 (two) times daily.    . traZODone (DESYREL) 50 MG tablet Take 50 mg by mouth at bedtime.    [name] UNIFINE PENTIPS 31G X 6 MM MISC      No current facility-administered medications for this visit.      Allergies:   Aspirin   Social History:  The patient  reports that she quit smoking about 11 years  ago. Her smoking use included cigarettes. She has a 5.00 pack-year smoking history. She has never used smokeless tobacco. She reports that she does not drink alcohol or use drugs.   Family History:   family history includes Cancer in her sister; Depression in her mother; Diabetes in her brother, mother, and unknown relative; Heart attack in her mother and sister; Heart disease in her mother; Heart failure in her mother and sister; Hypertension in her brother, mother, sister, and unknown relative; SIDS in her sister; Stroke in her mother.    Review of Systems: Review of Systems  Constitutional: Negative.   Respiratory: Negative.   Cardiovascular: Negative.   Gastrointestinal: Negative.   Musculoskeletal: Negative.   Neurological: Positive for seizures.  Psychiatric/Behavioral: Negative.   All other systems reviewed and are negative.    PHYSICAL EXAM: VS:  BP 131/78 (BP Location: Left Wrist, Patient Position: Sitting, Cuff Size: Normal)   Pulse 62   Ht _0  (1.803 m)   Wt (!) 311 lb 8 oz (141.3 kg)   BMI 43.45 kg/m  , BMI Body mass index is 43.45 kg/m. Constitutional:  oriented to person, place, and time. No distress. morbidly obese HENT:  Head: Normocephalic and atraumatic.  Eyes:  no discharge. No scleral icterus.  Neck: Normal range of motion. Neck supple. No JVD present.  Cardiovascular: Normal  rate, regular rhythm, normal heart sounds and intact distal pulses. Exam reveals no gallop and no friction rub. No edema No murmur heard. Pulmonary/Chest: Effort normal and breath sounds normal. No stridor. No respiratory distress.  no wheezes.  no rales.  no tenderness.  Abdominal: Soft.  no distension.  no tenderness.  Musculoskeletal: Normal range of motion.  no  tenderness or deformity.  Neurological:  normal muscle tone. Coordination normal. No atrophy Skin: Skin is warm and dry. No rash noted. not diaphoretic.  Psychiatric:  normal mood and affect. behavior is normal. Thought content normal.     Recent Labs: 03/16/2017: B Natriuretic Peptide 166.0 08/16/2017: Hemoglobin 9.7; Platelets 212 09/25/2017: BUN 26; Creatinine, Ser 1.04; Potassium 3.9; Sodium 139    Lipid Panel Lab Results  Component Value Date   CHOL 120 01/30/2017   HDL 47 01/30/2017   LDLCALC 58 01/30/2017   TRIG 73 01/30/2017      Wt Readings from Last 3 Encounters:  11/09/17 (!) 311 lb 8 oz (141.3 kg)  11/04/17 (!) 321 lb 3.2 oz (145.7 kg)  10/26/17 (!) 312 lb (141.5 kg)       ASSESSMENT AND PLAN:   Essential hypertension - Plan: EKG 12-Lead Blood pressure is well controlled on today's visit. No changes made to the medications. stable  CAD in native artery - Plan: EKG 12-Lead negative stress test, February 2018  No further testing at this time  Chronic diastolic congestive heart failure ([place]) - Plan: EKG 12-Lead Recommend she stay on torsemide 60 twice a day Only add metolazone 2.5 mg as needed in the morning for weight over 320 pounds currently down to 311 after 10 pound weight loss with metolazone 3 days  Chest pain  Plan: EKG 12-Lead previoushospitalization with musculoskeletal disorder  admission for piece of meat stuck in her throat Suspect stricture. Follow-up with GI  Morbid obesity ([place]) - Plan: EKG 12-Lead Recommended low carbohydrate diet, avoid breads Unable to exercise secondary  to chronic back pain Weight continues to run markedly high  Osteoarthritis knee   acceptable risk for knee surgery Unable to exercise  Diabetes type 2, poorly controlled decrease  her potatoes and bread Family present  Disposition:   F/U  12 months   Total encounter time more than 25 minutes  Greater than 50% was spent in counseling and coordination of care with the patient    Orders Placed This Encounter  Procedures  . EKG 12-Lead     Signed, Esmond Plants, M.D., Ph.D. 11/09/2017  [place], [place]

## 2017-11-09 ENCOUNTER — Encounter: Payer: Self-pay | Admitting: Cardiovascular Disease

## 2017-11-09 ENCOUNTER — Ambulatory Visit (INDEPENDENT_AMBULATORY_CARE_PROVIDER_SITE_OTHER): Payer: Medicare HMO | Admitting: Cardiovascular Disease

## 2017-11-09 VITALS — BP 131/78 | HR 62 | Ht 71.0 in | Wt 311.5 lb

## 2017-11-09 DIAGNOSIS — I5032 Chronic diastolic (congestive) heart failure: Secondary | ICD-10-CM

## 2017-11-09 DIAGNOSIS — Z794 Long term (current) use of insulin: Secondary | ICD-10-CM

## 2017-11-09 DIAGNOSIS — J432 Centrilobular emphysema: Secondary | ICD-10-CM

## 2017-11-09 DIAGNOSIS — I25118 Atherosclerotic heart disease of native coronary artery with other forms of angina pectoris: Secondary | ICD-10-CM | POA: Diagnosis not present

## 2017-11-09 DIAGNOSIS — E119 Type 2 diabetes mellitus without complications: Secondary | ICD-10-CM

## 2017-11-09 DIAGNOSIS — I10 Essential (primary) hypertension: Secondary | ICD-10-CM

## 2017-11-09 NOTE — Patient Instructions (Addendum)
Medication Instructions:   No medication changes made  Take metolazone sparingly for weight >322  Labwork:  No new labs needed  Testing/Procedures:  No further testing at this time   Follow-Up: It was a pleasure seeing you in the office today. Please call us if you have new issues that need to be addressed before your next appt.  [phone]  Your physician wants you to follow-up in: 6 months.  You will receive a reminder letter in the mail two months in advance. If you don't receive a letter, please call our office to schedule the follow-up appointment.  If you need a refill on your cardiac medications before your next appointment, please call your pharmacy.  For educational health videos Log in to : [email or web address] Or : [email or web address], password : [email or web address]

## 2017-11-10 ENCOUNTER — Emergency Department: Payer: Medicare HMO

## 2017-11-10 ENCOUNTER — Other Ambulatory Visit: Payer: Self-pay

## 2017-11-10 ENCOUNTER — Observation Stay
Admission: EM | Admit: 2017-11-10 | Discharge: 2017-11-12 | Disposition: A | Discharge status: Home / Self Care | Payer: Medicare HMO | Attending: Internal Medicine | Admitting: Internal Medicine

## 2017-11-10 DIAGNOSIS — Z6841 Body Mass Index (BMI) 40.0 and over, adult: Secondary | ICD-10-CM | POA: Diagnosis not present

## 2017-11-10 DIAGNOSIS — I5032 Chronic diastolic (congestive) heart failure: Secondary | ICD-10-CM | POA: Insufficient documentation

## 2017-11-10 DIAGNOSIS — R1310 Dysphagia, unspecified: Secondary | ICD-10-CM | POA: Insufficient documentation

## 2017-11-10 DIAGNOSIS — M47812 Spondylosis without myelopathy or radiculopathy, cervical region: Secondary | ICD-10-CM | POA: Insufficient documentation

## 2017-11-10 DIAGNOSIS — I160 Hypertensive urgency: Secondary | ICD-10-CM | POA: Insufficient documentation

## 2017-11-10 DIAGNOSIS — I110 Hypertensive heart disease with heart failure: Secondary | ICD-10-CM | POA: Diagnosis not present

## 2017-11-10 DIAGNOSIS — Z833 Family history of diabetes mellitus: Secondary | ICD-10-CM | POA: Insufficient documentation

## 2017-11-10 DIAGNOSIS — M4802 Spinal stenosis, cervical region: Secondary | ICD-10-CM | POA: Insufficient documentation

## 2017-11-10 DIAGNOSIS — I25118 Atherosclerotic heart disease of native coronary artery with other forms of angina pectoris: Secondary | ICD-10-CM | POA: Insufficient documentation

## 2017-11-10 DIAGNOSIS — Z8489 Family history of other specified conditions: Secondary | ICD-10-CM | POA: Insufficient documentation

## 2017-11-10 DIAGNOSIS — Z808 Family history of malignant neoplasm of other organs or systems: Secondary | ICD-10-CM | POA: Insufficient documentation

## 2017-11-10 DIAGNOSIS — Z951 Presence of aortocoronary bypass graft: Secondary | ICD-10-CM | POA: Insufficient documentation

## 2017-11-10 DIAGNOSIS — I252 Old myocardial infarction: Secondary | ICD-10-CM | POA: Insufficient documentation

## 2017-11-10 DIAGNOSIS — G40909 Epilepsy, unspecified, not intractable, without status epilepticus: Secondary | ICD-10-CM | POA: Diagnosis not present

## 2017-11-10 DIAGNOSIS — Z9862 Peripheral vascular angioplasty status: Secondary | ICD-10-CM | POA: Diagnosis not present

## 2017-11-10 DIAGNOSIS — F419 Anxiety disorder, unspecified: Secondary | ICD-10-CM | POA: Diagnosis not present

## 2017-11-10 DIAGNOSIS — G8929 Other chronic pain: Secondary | ICD-10-CM | POA: Insufficient documentation

## 2017-11-10 DIAGNOSIS — Z8249 Family history of ischemic heart disease and other diseases of the circulatory system: Secondary | ICD-10-CM | POA: Insufficient documentation

## 2017-11-10 DIAGNOSIS — Z87891 Personal history of nicotine dependence: Secondary | ICD-10-CM | POA: Diagnosis not present

## 2017-11-10 DIAGNOSIS — Z818 Family history of other mental and behavioral disorders: Secondary | ICD-10-CM | POA: Insufficient documentation

## 2017-11-10 DIAGNOSIS — F329 Major depressive disorder, single episode, unspecified: Secondary | ICD-10-CM | POA: Insufficient documentation

## 2017-11-10 DIAGNOSIS — R569 Unspecified convulsions: Secondary | ICD-10-CM | POA: Diagnosis present

## 2017-11-10 DIAGNOSIS — Z886 Allergy status to analgesic agent status: Secondary | ICD-10-CM | POA: Diagnosis not present

## 2017-11-10 DIAGNOSIS — Z79899 Other long term (current) drug therapy: Secondary | ICD-10-CM | POA: Insufficient documentation

## 2017-11-10 DIAGNOSIS — E876 Hypokalemia: Secondary | ICD-10-CM | POA: Diagnosis present

## 2017-11-10 DIAGNOSIS — Z794 Long term (current) use of insulin: Secondary | ICD-10-CM | POA: Diagnosis not present

## 2017-11-10 DIAGNOSIS — I447 Left bundle-branch block, unspecified: Secondary | ICD-10-CM | POA: Diagnosis not present

## 2017-11-10 DIAGNOSIS — E119 Type 2 diabetes mellitus without complications: Secondary | ICD-10-CM | POA: Insufficient documentation

## 2017-11-10 DIAGNOSIS — J449 Chronic obstructive pulmonary disease, unspecified: Secondary | ICD-10-CM | POA: Insufficient documentation

## 2017-11-10 DIAGNOSIS — M549 Dorsalgia, unspecified: Secondary | ICD-10-CM | POA: Insufficient documentation

## 2017-11-10 DIAGNOSIS — Z823 Family history of stroke: Secondary | ICD-10-CM | POA: Insufficient documentation

## 2017-11-10 LAB — URINALYSIS, COMPLETE (UACMP) WITH MICROSCOPIC
BACTERIA UA: NONE SEEN
Bilirubin Urine: NEGATIVE
Glucose, UA: NEGATIVE mg/dL
Hgb urine dipstick: NEGATIVE
KETONES UR: NEGATIVE mg/dL
Leukocytes, UA: NEGATIVE
Nitrite: NEGATIVE
Protein, ur: NEGATIVE mg/dL
SPECIFIC GRAVITY, URINE: 1.008 (ref 1.005–1.030)
pH: 7 (ref 5.0–8.0)

## 2017-11-10 LAB — CBC WITH DIFFERENTIAL/PLATELET
Basophils Absolute: 0 10*3/uL (ref 0–0.1)
Basophils Relative: 1 %
EOS ABS: 0.1 10*3/uL (ref 0–0.7)
Eosinophils Relative: 2 %
HEMATOCRIT: 32.1 % — AB (ref 35.0–47.0)
Hemoglobin: 10.5 g/dL — ABNORMAL LOW (ref 12.0–16.0)
LYMPHS ABS: 2.1 10*3/uL (ref 1.0–3.6)
LYMPHS PCT: 34 %
MCH: 25.8 pg — AB (ref 26.0–34.0)
MCHC: 32.8 g/dL (ref 32.0–36.0)
MCV: 78.7 fL — ABNORMAL LOW (ref 80.0–100.0)
MONOS PCT: 10 %
Monocytes Absolute: 0.6 10*3/uL (ref 0.2–0.9)
NEUTROS ABS: 3.3 10*3/uL (ref 1.4–6.5)
NEUTROS PCT: 53 %
Platelets: 279 10*3/uL (ref 150–440)
RBC: 4.08 MIL/uL (ref 3.80–5.20)
RDW: 16.3 % — ABNORMAL HIGH (ref 11.5–14.5)
WBC: 6.2 10*3/uL (ref 3.6–11.0)

## 2017-11-10 LAB — COMPREHENSIVE METABOLIC PANEL
ALT: 14 U/L (ref 0–44)
AST: 20 U/L (ref 15–41)
Albumin: 3.9 g/dL (ref 3.5–5.0)
Alkaline Phosphatase: 156 U/L — ABNORMAL HIGH (ref 38–126)
Anion gap: 14 (ref 5–15)
BILIRUBIN TOTAL: 0.8 mg/dL (ref 0.3–1.2)
BUN: 52 mg/dL — ABNORMAL HIGH (ref 8–23)
CALCIUM: 8.9 mg/dL (ref 8.9–10.3)
CO2: 33 mmol/L — AB (ref 22–32)
Chloride: 92 mmol/L — ABNORMAL LOW (ref 98–111)
Creatinine, Ser: 1.71 mg/dL — ABNORMAL HIGH (ref 0.44–1.00)
GFR calc non Af Amer: 29 mL/min — ABNORMAL LOW (ref >60)
GFR, EST AFRICAN AMERICAN: 34 mL/min — AB (ref >60)
GLUCOSE: 156 mg/dL — AB (ref 70–99)
Potassium: 2.5 mmol/L — CL (ref 3.5–5.1)
SODIUM: 139 mmol/L (ref 135–145)
TOTAL PROTEIN: 7.8 g/dL (ref 6.5–8.1)

## 2017-11-10 LAB — URINE DRUG SCREEN, QUALITATIVE (ARMC ONLY)
AMPHETAMINES, UR SCREEN: NOT DETECTED
Benzodiazepine, Ur Scrn: NOT DETECTED
CANNABINOID 50 NG, UR ~~LOC~~: NOT DETECTED
COCAINE METABOLITE, UR ~~LOC~~: NOT DETECTED
MDMA (ECSTASY) UR SCREEN: NOT DETECTED
METHADONE SCREEN, URINE: NOT DETECTED
Opiate, Ur Screen: POSITIVE — AB
Phencyclidine (PCP) Ur S: NOT DETECTED
TRICYCLIC, UR SCREEN: NOT DETECTED

## 2017-11-10 LAB — MAGNESIUM: MAGNESIUM: 1.9 mg/dL (ref 1.7–2.4)

## 2017-11-10 LAB — TROPONIN I: Troponin I: <0.03 ng/mL (ref <0.03)

## 2017-11-10 LAB — GLUCOSE, CAPILLARY: GLUCOSE-CAPILLARY: 119 mg/dL — AB (ref 70–99)

## 2017-11-10 MED ORDER — TIOTROPIUM BROMIDE MONOHYDRATE 18 MCG IN CAPS
18.0000 ug | ORAL_CAPSULE | Freq: Every morning | RESPIRATORY_TRACT | Status: DC
  Administered 2017-11-11 – 2017-11-12 (×2): 18 ug via RESPIRATORY_TRACT
  Filled 2017-11-10: qty 5

## 2017-11-10 MED ORDER — POTASSIUM CHLORIDE CRYS ER 20 MEQ PO TBCR
40.0000 meq | EXTENDED_RELEASE_TABLET | Freq: Once | ORAL | Status: AC
  Administered 2017-11-10: 40 meq via ORAL
  Filled 2017-11-10: qty 2

## 2017-11-10 MED ORDER — LORAZEPAM 2 MG/ML IJ SOLN: 1.0000 mg | INTRAMUSCULAR | Status: DC | PRN

## 2017-11-10 MED ORDER — TORSEMIDE 20 MG PO TABS
60.0000 mg | ORAL_TABLET | Freq: Two times a day (BID) | ORAL | Status: DC
  Administered 2017-11-11 – 2017-11-12 (×3): 60 mg via ORAL
  Filled 2017-11-10 (×3): qty 3

## 2017-11-10 MED ORDER — ALBUTEROL SULFATE (2.5 MG/3ML) 0.083% IN NEBU: 2.5000 mg | INHALATION_SOLUTION | RESPIRATORY_TRACT | Status: DC | PRN

## 2017-11-10 MED ORDER — HYDROCODONE-ACETAMINOPHEN 5-325 MG PO TABS
1.0000 | ORAL_TABLET | Freq: Two times a day (BID) | ORAL | Status: DC
  Administered 2017-11-10 – 2017-11-11 (×2): 1 via ORAL
  Filled 2017-11-10 (×3): qty 1

## 2017-11-10 MED ORDER — ONDANSETRON HCL 4 MG PO TABS: 4.0000 mg | ORAL_TABLET | Freq: Four times a day (QID) | ORAL | Status: DC | PRN

## 2017-11-10 MED ORDER — LEVETIRACETAM 750 MG PO TABS
750.0000 mg | ORAL_TABLET | Freq: Two times a day (BID) | ORAL | Status: DC
  Administered 2017-11-11: 09:00:00 750 mg via ORAL
  Filled 2017-11-10 (×2): qty 1

## 2017-11-10 MED ORDER — PANTOPRAZOLE SODIUM 40 MG PO TBEC
40.0000 mg | DELAYED_RELEASE_TABLET | Freq: Every day | ORAL | Status: DC
Start: 2017-11-11 — End: 2017-11-12
  Administered 2017-11-11 – 2017-11-12 (×2): 40 mg via ORAL
  Filled 2017-11-10 (×2): qty 1

## 2017-11-10 MED ORDER — TRAZODONE HCL 50 MG PO TABS
50.0000 mg | ORAL_TABLET | Freq: Every day | ORAL | Status: DC
  Administered 2017-11-10 – 2017-11-11 (×2): 50 mg via ORAL
  Filled 2017-11-10 (×2): qty 1

## 2017-11-10 MED ORDER — INSULIN ASPART 100 UNIT/ML ~~LOC~~ SOLN
0.0000 [IU] | Freq: Every day | SUBCUTANEOUS | Status: DC
  Administered 2017-11-11: 3 [IU] via SUBCUTANEOUS
  Filled 2017-11-10: qty 1

## 2017-11-10 MED ORDER — ONDANSETRON HCL 4 MG/2ML IJ SOLN: 4.0000 mg | Freq: Four times a day (QID) | INTRAMUSCULAR | Status: DC | PRN

## 2017-11-10 MED ORDER — CITALOPRAM HYDROBROMIDE 20 MG PO TABS
20.0000 mg | ORAL_TABLET | Freq: Every day | ORAL | Status: DC
  Administered 2017-11-10 – 2017-11-11 (×2): 20 mg via ORAL
  Filled 2017-11-10 (×2): qty 1

## 2017-11-10 MED ORDER — INSULIN GLARGINE 100 UNIT/ML ~~LOC~~ SOLN
25.0000 [IU] | Freq: Two times a day (BID) | SUBCUTANEOUS | Status: DC
  Administered 2017-11-10 – 2017-11-12 (×4): 25 [IU] via SUBCUTANEOUS
  Filled 2017-11-10 (×5): qty 0.25

## 2017-11-10 MED ORDER — POLYETHYLENE GLYCOL 3350 17 G PO PACK: 17.0000 g | PACK | Freq: Every day | ORAL | Status: DC | PRN

## 2017-11-10 MED ORDER — LEVETIRACETAM IN NACL 1000 MG/100ML IV SOLN
1000.0000 mg | Freq: Once | INTRAVENOUS | Status: AC
  Administered 2017-11-10: 1000 mg via INTRAVENOUS
  Filled 2017-11-10: qty 100

## 2017-11-10 MED ORDER — ACETAMINOPHEN 325 MG PO TABS
650.0000 mg | ORAL_TABLET | Freq: Four times a day (QID) | ORAL | Status: DC | PRN
  Administered 2017-11-11: 650 mg via ORAL
  Filled 2017-11-10: qty 2

## 2017-11-10 MED ORDER — MAGNESIUM SULFATE 2 GM/50ML IV SOLN
2.0000 g | Freq: Once | INTRAVENOUS | Status: AC
  Administered 2017-11-10: 2 g via INTRAVENOUS
  Filled 2017-11-10: qty 50

## 2017-11-10 MED ORDER — ACETAMINOPHEN 650 MG RE SUPP: 650.0000 mg | Freq: Four times a day (QID) | RECTAL | Status: DC | PRN

## 2017-11-10 MED ORDER — ATORVASTATIN CALCIUM 20 MG PO TABS
40.0000 mg | ORAL_TABLET | Freq: Every day | ORAL | Status: DC
  Administered 2017-11-10 – 2017-11-11 (×2): 40 mg via ORAL
  Filled 2017-11-10 (×2): qty 2

## 2017-11-10 MED ORDER — CARVEDILOL 3.125 MG PO TABS
3.1250 mg | ORAL_TABLET | Freq: Two times a day (BID) | ORAL | Status: DC
  Administered 2017-11-11 – 2017-11-12 (×3): 3.125 mg via ORAL
  Filled 2017-11-10 (×3): qty 1

## 2017-11-10 MED ORDER — ISOSORBIDE MONONITRATE ER 30 MG PO TB24
30.0000 mg | ORAL_TABLET | Freq: Two times a day (BID) | ORAL | Status: DC
  Administered 2017-11-10 – 2017-11-12 (×4): 30 mg via ORAL
  Filled 2017-11-10 (×4): qty 1

## 2017-11-10 MED ORDER — POTASSIUM CHLORIDE CRYS ER 20 MEQ PO TBCR
40.0000 meq | EXTENDED_RELEASE_TABLET | Freq: Three times a day (TID) | ORAL | Status: DC
  Administered 2017-11-11 – 2017-11-12 (×4): 40 meq via ORAL
  Filled 2017-11-10 (×4): qty 2

## 2017-11-10 MED ORDER — POTASSIUM CHLORIDE 10 MEQ/100ML IV SOLN: 10.0000 meq | INTRAVENOUS | Status: AC

## 2017-11-10 MED ORDER — POTASSIUM CHLORIDE CRYS ER 20 MEQ PO TBCR
40.0000 meq | EXTENDED_RELEASE_TABLET | ORAL | Status: AC
  Administered 2017-11-10 – 2017-11-11 (×2): 40 meq via ORAL
  Filled 2017-11-10 (×2): qty 2

## 2017-11-10 MED ORDER — MONTELUKAST SODIUM 10 MG PO TABS
10.0000 mg | ORAL_TABLET | Freq: Every day | ORAL | Status: DC
  Administered 2017-11-11 – 2017-11-12 (×2): 10 mg via ORAL
  Filled 2017-11-10 (×2): qty 1

## 2017-11-10 MED ORDER — ENOXAPARIN SODIUM 40 MG/0.4ML ~~LOC~~ SOLN
40.0000 mg | SUBCUTANEOUS | Status: DC
  Administered 2017-11-10: 40 mg via SUBCUTANEOUS
  Filled 2017-11-10: qty 0.4

## 2017-11-10 MED ORDER — INSULIN ASPART 100 UNIT/ML ~~LOC~~ SOLN
0.0000 [IU] | Freq: Three times a day (TID) | SUBCUTANEOUS | Status: DC
  Administered 2017-11-11: 7 [IU] via SUBCUTANEOUS
  Administered 2017-11-11 (×2): 5 [IU] via SUBCUTANEOUS
  Administered 2017-11-12: 12:00:00 9 [IU] via SUBCUTANEOUS
  Administered 2017-11-12: 5 [IU] via SUBCUTANEOUS
  Filled 2017-11-10 (×5): qty 1

## 2017-11-10 MED ORDER — SODIUM CHLORIDE 0.9 % IV BOLUS
250.0000 mL | Freq: Once | INTRAVENOUS | Status: AC
  Administered 2017-11-10: 250 mL via INTRAVENOUS

## 2017-11-10 NOTE — ED Notes (Signed)
Patient transported to CT 

## 2017-11-10 NOTE — ED Notes (Signed)
Patient is resting comfortably. 
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
## 2017-11-10 NOTE — H&P (Signed)
[facility] [place] NAME: Tina Hayes    MR#:  [id number]  DATE OF BIRTH:  1946-04-01  DATE OF ADMISSION:  11/10/2017  PRIMARY CARE PHYSICIAN: Soles, Howell Rucks, MD   REQUESTING/REFERRING PHYSICIAN: Dr. Lucita Lora  CHIEF COMPLAINT:   Chief Complaint  Patient presents with  . Seizures    HISTORY OF PRESENT ILLNESS:  Tina Hayes  is a 72 y.o. female with a known history of COPD, chronic diastolic CHF, anxiety, CAD, diabetes mellitus, hypertension, seizures on Keppra presents to the hospital due to 3 episodes of seizures.  Patient mentions that she has been compliant with her Keppra.  Seizures were witnessed by family at home.  She mentions that her seizures normally start with a headache first and then she is has a seizure.  Follows with Dr. Manuella Ghazi of neurology at [facility].  CT scan of the head checked in the emergency room showed nothing acute.  Patient is being admitted for breakthrough seizures while being on her seizure medication.  IV Keppra 1 g given in the ED.  PAST MEDICAL HISTORY:   Past Medical History:  Diagnosis Date  . (HFpEF) heart failure with preserved ejection fraction ([place])    a. 05/2016 Echo: EF 60-65%, mild to mod LVH, Gr1 DD, mild MR, mildly dil LA, mod TR, mildly to mod increased PASP.  [name] Acute diastolic heart failure ([place]) 01/27/2017  . Anxiety   . Chest pain 06/16/2016  . CHF (congestive heart failure) ([place])   . Chronic back pain   . Chronic diastolic congestive heart failure ([place]) 02/13/2017  . COPD (chronic obstructive pulmonary disease) ([place])   . Coronary artery disease    a. s/p remote PCI x 5;  b. 2006 s/p CABG x 3 ([place]); b. 05/2016 MV: attenuation corrected images w/o ischemia or wma-->Med rx.  . Coronary artery disease of native artery of native heart with stable angina pectoris ([place]) 06/17/2016  . Depression   . Diabetes mellitus without complication ([place])   .  Essential hypertension 06/30/2016  . Heart attack ([place])    Total of 3 per pt.  . Hypertension   . Hypertensive urgency 06/03/2015  . Seizures ([place])     PAST SURGICAL HISTORY:   Past Surgical History:  Procedure Laterality Date  . ANKLE SURGERY    . COLONOSCOPY WITH PROPOFOL N/A 05/04/2017   Procedure: COLONOSCOPY WITH PROPOFOL;  Surgeon: [name], MD;  Location: [facility] ENDOSCOPY;  Service: Endoscopy;  Laterality: N/A;  . CORONARY ANGIOPLASTY    . CORONARY ARTERY BYPASS GRAFT    . ESOPHAGOGASTRODUODENOSCOPY (EGD) WITH PROPOFOL N/A 05/04/2017   Procedure: ESOPHAGOGASTRODUODENOSCOPY (EGD) WITH PROPOFOL;  Surgeon: [name], MD;  Location: [facility] ENDOSCOPY;  Service: Endoscopy;  Laterality: N/A;  . FRACTURE SURGERY    . KNEE SURGERY    . MOUTH SURGERY Left 07/14/2017    SOCIAL HISTORY:   Social History   Tobacco Use  . Smoking status: Former Smoker    Packs/day: 0.25    Years: 20.00    Pack years: 5.00    Types: Cigarettes    Last attempt to quit: 07/01/2006    Years since quitting: 11.3  . Smokeless tobacco: Never Used  Substance Use Topics  . Alcohol use: No    Alcohol/week: 0.0 oz    FAMILY HISTORY:   Family History  Problem Relation Age of Onset  . Diabetes Unknown   . Hypertension Unknown   .  Diabetes Mother   . Heart failure Mother   . Heart disease Mother   . Heart attack Mother   . Stroke Mother   . Depression Mother   . Hypertension Mother   . Cancer Sister        brain  . Hypertension Sister   . Diabetes Brother   . Hypertension Brother   . Heart failure Sister   . Heart attack Sister   . SIDS Sister     DRUG ALLERGIES:   Allergies  Allergen Reactions  . Aspirin Anaphylaxis    REVIEW OF SYSTEMS:   Review of Systems  Constitutional: Positive for malaise/fatigue. Negative for chills and fever.  HENT: Negative for sore throat.   Eyes: Negative for blurred vision, double vision and pain.  Respiratory: Negative for cough, hemoptysis,  shortness of breath and wheezing.   Cardiovascular: Negative for chest pain, palpitations, orthopnea and leg swelling.  Gastrointestinal: Negative for abdominal pain, constipation, diarrhea, heartburn, nausea and vomiting.  Genitourinary: Negative for dysuria and hematuria.  Musculoskeletal: Negative for back pain and joint pain.  Skin: Negative for rash.  Neurological: Positive for seizures and headaches. Negative for sensory change, speech change and focal weakness.  Endo/Heme/Allergies: Does not bruise/bleed easily.  Psychiatric/Behavioral: Negative for depression. The patient is not nervous/anxious.     MEDICATIONS AT HOME:   Prior to Admission medications   Medication Sig Start Date End Date Taking? Authorizing Provider  atorvastatin (LIPITOR) 40 MG tablet Take 40 mg at bedtime by mouth.    Yes [provider]  Calcium Carbonate-Vitamin D3 (CALCIUM 600-D) 600-400 MG-UNIT TABS Take 1 tablet 2 (two) times daily by mouth.   Yes [provider]  carvedilol (COREG) 3.125 MG tablet Take 1 tablet (3.125 mg total) by mouth 2 (two) times daily with a meal. 08/12/16  Yes Hackney, Tina A, FNP  citalopram (CELEXA) 20 MG tablet Take 20 mg by mouth at bedtime.    Yes [provider]  esomeprazole (NEXIUM) 40 MG capsule Take 40 mg daily at 12 noon by mouth.   Yes [provider]  insulin aspart (NOVOLOG) 100 UNIT/ML injection Inject 5-10 Units 2 (two) times daily with a meal into the skin. Take 10 units in the morning and 5 units in the evening with meal   Yes [provider]  insulin glargine (LANTUS) 100 UNIT/ML injection Inject 30-40 Units 2 (two) times daily into the skin. Inject 30 units in the morning at 40 units at bedtime.   Yes [provider]  isosorbide mononitrate (IMDUR) 30 MG 24 hr tablet Take 1 tablet (30 mg total) by mouth 2 (two) times daily. 08/12/16  Yes Darylene Price A, FNP  levETIRAcetam (KEPPRA) 750 MG tablet Take 1 tablet (750 mg  total) by mouth 2 (two) times daily. 06/05/15  Yes Rai, Ripudeep K, MD  meloxicam (MOBIC) 7.5 MG tablet Take 7.5 mg daily by mouth.   Yes [provider]  montelukast (SINGULAIR) 10 MG tablet Take 1 tablet by mouth daily. 11/05/17  Yes [provider]  potassium chloride SA (K-DUR,KLOR-CON) 20 MEQ tablet Take 2 tablets (40 mEq total) by mouth 3 (three) times daily. Take an extra 2 tablets (40 meq) by mouth on the days you take metolazone 11/06/17 02/04/18 Yes Gollan, Kathlene November, MD  tiotropium (SPIRIVA) 18 MCG inhalation capsule Place 18 mcg into inhaler and inhale daily.   Yes [provider]  torsemide (DEMADEX) 20 MG tablet Take 3 tablets (60 mg total) by mouth  2 (two) times daily. 11/06/17 12/06/17 Yes Gollan, Kathlene November, MD  traZODone (DESYREL) 50 MG tablet Take 50 mg by mouth at bedtime.   Yes [provider]  ACCU-CHEK AVIVA PLUS test strip  05/19/16   [provider]  albuterol (PROVENTIL HFA;VENTOLIN HFA) 108 (90 Base) MCG/ACT inhaler Inhale 2 puffs into the lungs every 6 (six) hours as needed for wheezing or shortness of breath. 08/30/15   Epifanio Lesches, MD  albuterol (PROVENTIL) (2.5 MG/3ML) 0.083% nebulizer solution Take 2.5 mg every 4 (four) hours as needed by nebulization for wheezing or shortness of breath.    [provider]  Blood Glucose Monitoring Suppl (ACCU-CHEK AVIVA PLUS) w/Device KIT  06/13/16   [provider]  clopidogrel (PLAVIX) 75 MG tablet Take 1 tablet (75 mg total) by mouth daily. Patient not taking: Reported on 11/10/2017 08/14/16   Minna Merritts, MD  HYDROcodone-acetaminophen (NORCO/VICODIN) 5-325 MG tablet Take 1 tablet by mouth 2 (two) times daily. 10/26/17   Molli Barrows, MD  metolazone (ZAROXOLYN) 2.5 MG tablet Take 1 tablet (2.5 mg) by mouth once daily as needed for a weigh > 320 lbs, take 30 minutes prior to you first dose of torsemide 11/06/17   Gollan, Kathlene November, MD  nitroGLYCERIN (NITROSTAT) 0.4 MG SL  tablet DISSOLVE ONE TABLET UNDER THE TONGUE EVERY 5 MINUTES AS NEEDED FOR CHEST PAIN.  DO NOT EXCEED A TOTAL OF 3 DOSES IN 15 MINUTES 07/30/17   Gollan, Kathlene November, MD  UNIFINE PENTIPS 31G X 6 MM MISC  07/23/16   [provider]     VITAL SIGNS:  Blood pressure (!) 156/60, pulse 64, temperature 98.2 F (36.8 C), temperature source Oral, resp. rate 20, height '5\' 11"'  (1.803 m), weight (!) 137.4 kg (303 lb), SpO2 100 %.  PHYSICAL EXAMINATION:  Physical Exam  GENERAL:  71 y.o.-year-old patient lying in the bed with no acute distress.  Morbidly obese EYES: Pupils equal, round, reactive to light and accommodation. No scleral icterus. Extraocular muscles intact.  HEENT: Head atraumatic, normocephalic. Oropharynx and nasopharynx clear. No oropharyngeal erythema, moist oral mucosa  NECK:  Supple, no jugular venous distention. No thyroid enlargement, no tenderness.  LUNGS: Normal breath sounds bilaterally, no wheezing, rales, rhonchi. No use of accessory muscles of respiration.  CARDIOVASCULAR: S1, S2 normal. No murmurs, rubs, or gallops.  ABDOMEN: Soft, nontender, nondistended. Bowel sounds present. No organomegaly or mass.  EXTREMITIES: No pedal edema, cyanosis, or clubbing. + 2 pedal & radial pulses b/l.   NEUROLOGIC: Cranial nerves II through XII are intact. No focal Motor or sensory deficits appreciated b/l PSYCHIATRIC: The patient is alert and oriented x 3. Good affect.  SKIN: No obvious rash, lesion, or ulcer.   LABORATORY PANEL:   CBC Recent Labs  Lab 11/10/17 1653  WBC 6.2  HGB 10.5*  HCT 32.1*  PLT 279   ------------------------------------------------------------------------------------------------------------------  Chemistries  Recent Labs  Lab 11/10/17 1653  NA 139  K 2.5*  CL 92*  CO2 33*  GLUCOSE 156*  BUN 52*  CREATININE 1.71*  CALCIUM 8.9  MG 1.9  AST 20  ALT 14  ALKPHOS 156*  BILITOT 0.8    ------------------------------------------------------------------------------------------------------------------  Cardiac Enzymes Recent Labs  Lab 11/10/17 1653  TROPONINI <0.03   ------------------------------------------------------------------------------------------------------------------  RADIOLOGY:  Ct Head Wo Contrast  Result Date: 11/10/2017 CLINICAL DATA:  72 year old female with seizure 3 days ago. Complaining and nausea weakness. Initial encounter. EXAM: CT HEAD WITHOUT CONTRAST CT CERVICAL SPINE WITHOUT CONTRAST TECHNIQUE: Multidetector  CT imaging of the head and cervical spine was performed following the standard protocol without intravenous contrast. Multiplanar CT image reconstructions of the cervical spine were also generated. COMPARISON:  None. FINDINGS: CT HEAD FINDINGS Brain: No intracranial hemorrhage or CT evidence of large acute infarct. Mild chronic microvascular changes. Mild global atrophy. No intracranial mass lesion noted on this unenhanced exam. Vascular: Vascular calcifications Skull: No skull fracture Sinuses/Orbits: No acute orbital abnormality. Visualized paranasal sinuses are clear. Other: Mastoid air cells and middle ear cavities are clear. CT CERVICAL SPINE FINDINGS Alignment: Straightening of the cervical spine. Minimal increased distance C5-6 interspinous region without other findings to suggest ligamentous injury. Skull base and vertebrae: Evaluation limited by patient's habitus. No obvious fracture. Soft tissues and spinal canal: No abnormal prevertebral soft tissue swelling identified. 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 Upper chest: No upper lung mass identified. Other: Carotid bifurcation calcifications. IMPRESSION: No skull fracture, intracranial hemorrhage or seizure focus identified on this unenhanced exam. Cervical spine CT limited by patient's habitus. No obvious  fracture. Minimal widening of the C5-6 interspinous region without other findings to suggest ligamentous injury. 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 If ligamentous injury or cord injury were of high clinical concern and further delineation were clinically desired then MR may be considered. Electronically Signed   By: Genia Del M.D.   On: 11/10/2017 17:28   Ct Cervical Spine Wo Contrast  Result Date: 11/10/2017 CLINICAL DATA:  72 year old female with seizure 3 days ago. Complaining and nausea weakness. Initial encounter. EXAM: CT HEAD WITHOUT CONTRAST CT CERVICAL SPINE WITHOUT CONTRAST TECHNIQUE: Multidetector CT imaging of the head and cervical spine was performed following the standard protocol without intravenous contrast. Multiplanar CT image reconstructions of the cervical spine were also generated. COMPARISON:  None. FINDINGS: CT HEAD FINDINGS Brain: No intracranial hemorrhage or CT evidence of large acute infarct. Mild chronic microvascular changes. Mild global atrophy. No intracranial mass lesion noted on this unenhanced exam. Vascular: Vascular calcifications Skull: No skull fracture Sinuses/Orbits: No acute orbital abnormality. Visualized paranasal sinuses are clear. Other: Mastoid air cells and middle ear cavities are clear. CT CERVICAL SPINE FINDINGS Alignment: Straightening of the cervical spine. Minimal increased distance C5-6 interspinous region without other findings to suggest ligamentous injury. Skull base and vertebrae: Evaluation limited by patient's habitus. No obvious fracture. Soft tissues and spinal canal: No abnormal prevertebral soft tissue swelling identified. 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 Upper chest: No upper lung mass identified. Other: Carotid bifurcation calcifications. IMPRESSION: No skull fracture, intracranial hemorrhage or seizure focus  identified on this unenhanced exam. Cervical spine CT limited by patient's habitus. No obvious fracture. Minimal widening of the C5-6 interspinous region without other findings to suggest ligamentous injury. 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 If ligamentous injury or cord injury were of high clinical concern and further delineation were clinically desired then MR may be considered. Electronically Signed   By: Genia Del M.D.   On: 11/10/2017 17:28     IMPRESSION AND PLAN:   *Recurrent seizures.  Noncompliance could be a problem.  Given IV Keppra in the emergency room.  We will continue oral Keppra at this time.  Consult neurology.  If any further seizures will need a second antiepileptic agent.  Ordered Ativan as needed.  Seizure precautions.  CT scan of the head showed nothing acute.  *Severe  hypokalemia.  Replace orally and IV.  Continue scheduled potassium from home in the morning.  Repeat labs.  Check magnesium.  *Hypertension.  Continue home medications.  *DVT prophylaxis with Lovenox  All the records are reviewed and case discussed with ED provider. Management plans discussed with the patient, family and they are in agreement.  CODE STATUS: Full code  TOTAL TIME TAKING CARE OF THIS PATIENT: 40 minutes.   Neita Carp M.D on 11/10/2017 at 8:26 PM  Between 7am to 6pm - Pager - [phone]  After 6pm go to [email or web address] - password [email or web address] [facility]  Office  [phone]  CC: Primary care physician; Herminio Commons, MD  Note: This dictation was prepared with Dragon dictation along with smaller phrase technology. Any transcriptional errors that result from this process are unintentional.

## 2017-11-10 NOTE — ED Notes (Signed)
Pt reports dizziness with position changes. Lights out for comfort.

## 2017-11-10 NOTE — ED Provider Notes (Signed)
[facility] Emergency Department Provider Note ____________________________________________   First MD Initiated Contact with Patient 11/10/17 1636     (approximate)  I have reviewed the triage vital signs and the nursing notes.   HISTORY  Chief Complaint Seizures HPI Tina Hayes is a 72 y.o. female with a history of heart failure as well as seizures on Keppra who is presenting to the emergency department today for a seizure earlier today.  EMS does not know how long the patient was seizing.  Apparently the patient's boyfriend had witnessed seizure but it was the patient's son who accompanied her to the EMS truck.  No seizure witnessed by EMS.  Patient complaining of diffuse headache which she says is been ongoing over the past 2 days and is a 10 out of 10.  Says that she has been nauseous as well as lightheaded when standing and walking.  Denies any burning with urination.  Says that she has been compliant with her Keppra.  Says that she also had a seizure 2 days ago.  Past Medical History:  Diagnosis Date  . (HFpEF) heart failure with preserved ejection fraction ([place])    a. 05/2016 Echo: EF 60-65%, mild to mod LVH, Gr1 DD, mild MR, mildly dil LA, mod TR, mildly to mod increased PASP.  [name] Acute diastolic heart failure ([place]) 01/27/2017  . Anxiety   . Chest pain 06/16/2016  . CHF (congestive heart failure) ([place])   . Chronic back pain   . Chronic diastolic congestive heart failure ([place]) 02/13/2017  . COPD (chronic obstructive pulmonary disease) ([place])   . Coronary artery disease    a. s/p remote PCI x 5;  b. 2006 s/p CABG x 3 ([place]); b. 05/2016 MV: attenuation corrected images w/o ischemia or wma-->Med rx.  . Coronary artery disease of native artery of native heart with stable angina pectoris ([place]) 06/17/2016  . Depression   . Diabetes mellitus without complication ([place])   . Essential hypertension 06/30/2016  . Heart attack ([place])     Total of 3 per pt.  . Hypertension   . Hypertensive urgency 06/03/2015  . Seizures [facility])     Patient Active Problem List   Diagnosis Date Noted  . Mass of right lower leg 09/11/2017  . Dysphagia 04/23/2017  . Chronic diastolic congestive heart failure ([place]) 02/13/2017  . Acute diastolic heart failure ([place]) 01/27/2017  . Depression 12/11/2016  . Essential hypertension 06/30/2016  . Snoring 06/30/2016  . Diabetes mellitus ([place]) 06/30/2016  . Coronary artery disease of native artery of native heart with stable angina pectoris ([place]) 06/17/2016  . Morbid obesity ([place]) 06/17/2016  . Chest pain 06/16/2016  . COPD (chronic obstructive pulmonary disease) ([place]) 08/28/2015  . Hypertensive urgency 06/03/2015  . COPD with acute exacerbation ([place]) 04/14/2015    Past Surgical History:  Procedure Laterality Date  . ANKLE SURGERY    . COLONOSCOPY WITH PROPOFOL N/A 05/04/2017   Procedure: COLONOSCOPY WITH PROPOFOL;  Surgeon: [name], MD;  Location: [facility] ENDOSCOPY;  Service: Endoscopy;  Laterality: N/A;  . CORONARY ANGIOPLASTY    . CORONARY ARTERY BYPASS GRAFT    . ESOPHAGOGASTRODUODENOSCOPY (EGD) WITH PROPOFOL N/A 05/04/2017   Procedure: ESOPHAGOGASTRODUODENOSCOPY (EGD) WITH PROPOFOL;  Surgeon: [name], MD;  Location: [facility] ENDOSCOPY;  Service: Endoscopy;  Laterality: N/A;  . FRACTURE SURGERY    . KNEE SURGERY    . MOUTH SURGERY Left 07/14/2017    Prior to Admission medications  Medication Sig Start Date End Date Taking? Authorizing Provider  ACCU-CHEK AVIVA PLUS test strip  05/19/16   [provider]  albuterol (PROVENTIL HFA;VENTOLIN HFA) 108 (90 Base) MCG/ACT inhaler Inhale 2 puffs into the lungs every 6 (six) hours as needed for wheezing or shortness of breath. 08/30/15   Epifanio Lesches, MD  albuterol (PROVENTIL) (2.5 MG/3ML) 0.083% nebulizer solution Take 2.5 mg every 4 (four) hours as needed by nebulization for wheezing or shortness of breath.    [provider]  atorvastatin (LIPITOR) 40 MG tablet Take 40 mg at bedtime by mouth.     [provider]  Blood Glucose Monitoring Suppl (ACCU-CHEK AVIVA PLUS) w/Device KIT  06/13/16   [provider]  Calcium Carbonate-Vitamin D3 (CALCIUM 600-D) 600-400 MG-UNIT TABS Take 1 tablet 2 (two) times daily by mouth.    [provider]  carvedilol (COREG) 3.125 MG tablet Take 1 tablet (3.125 mg total) by mouth 2 (two) times daily with a meal. 08/12/16   Alisa Graff, FNP  cetirizine (ZYRTEC) 10 MG tablet Take 10 mg by mouth daily.    [provider]  citalopram (CELEXA) 20 MG tablet Take 20 mg by mouth at bedtime.     [provider]  clopidogrel (PLAVIX) 75 MG tablet Take 1 tablet (75 mg total) by mouth daily. 08/14/16   Minna Merritts, MD  esomeprazole (NEXIUM) 40 MG capsule Take 40 mg daily at 12 noon by mouth.    [provider]  ferrous sulfate 325 (65 FE) MG tablet Take 325 mg 3 (three) times daily with meals by mouth.    [provider]  HYDROcodone-acetaminophen (NORCO/VICODIN) 5-325 MG tablet Take 1 tablet by mouth 2 (two) times daily. 10/26/17   Molli Barrows, MD  insulin aspart (NOVOLOG) 100 UNIT/ML injection Inject 5-10 Units 2 (two) times daily with a meal into the skin. Take 10 units in the morning and 5 units in the evening with meal    [provider]  insulin glargine (LANTUS) 100 UNIT/ML injection Inject 30-40 Units 2 (two) times daily into the skin. Inject 30 units in the morning at 40 units at bedtime.    [provider]  isosorbide mononitrate (IMDUR) 30 MG 24 hr tablet Take 1 tablet (30 mg total) by mouth 2 (two) times daily. 08/12/16   Alisa Graff, FNP  levETIRAcetam (KEPPRA) 750 MG tablet Take 1 tablet (750 mg total) by mouth 2 (two) times daily. 06/05/15   Rai, Vernelle Emerald, MD  losartan (COZAAR) 100 MG tablet Take 50 mg by mouth daily.    [provider]  meloxicam (MOBIC) 7.5 MG tablet Take  7.5 mg daily by mouth.    [provider]  metolazone (ZAROXOLYN) 2.5 MG tablet Take 1 tablet (2.5 mg) by mouth once daily as needed for a weigh > 320 lbs, take 30 minutes prior to you first dose of torsemide 11/06/17   Gollan, Kathlene November, MD  nitroGLYCERIN (NITROSTAT) 0.4 MG SL tablet DISSOLVE ONE TABLET UNDER THE TONGUE EVERY 5 MINUTES AS NEEDED FOR CHEST PAIN.  DO NOT EXCEED A TOTAL OF 3 DOSES IN 15 MINUTES 07/30/17   Gollan, Kathlene November, MD  potassium chloride SA (K-DUR,KLOR-CON) 20 MEQ tablet Take 2 tablets (40 mEq total) by mouth 3 (three) times daily. Take an extra 2 tablets (40 meq) by mouth on the days you take metolazone 11/06/17 02/04/18  Minna Merritts, MD  tiotropium (SPIRIVA) 18 MCG inhalation capsule Place 18  mcg into inhaler and inhale daily.    [provider]  torsemide (DEMADEX) 20 MG tablet Take 3 tablets (60 mg total) by mouth 2 (two) times daily. 11/06/17 12/06/17  Minna Merritts, MD  traZODone (DESYREL) 50 MG tablet Take 50 mg by mouth at bedtime.    [provider]  UNIFINE PENTIPS 31G X 6 MM [place]  07/23/16   [provider]    Allergies Aspirin  Family History  Problem Relation Age of Onset  . Diabetes Unknown   . Hypertension Unknown   . Diabetes Mother   . Heart failure Mother   . Heart disease Mother   . Heart attack Mother   . Stroke Mother   . Depression Mother   . Hypertension Mother   . Cancer Sister        brain  . Hypertension Sister   . Diabetes Brother   . Hypertension Brother   . Heart failure Sister   . Heart attack Sister   . SIDS Sister     Social History Social History   Tobacco Use  . Smoking status: Former Smoker    Packs/day: 0.25    Years: 20.00    Pack years: 5.00    Types: Cigarettes    Last attempt to quit: 07/01/2006    Years since quitting: 11.3  . Smokeless tobacco: Never Used  Substance Use Topics  . Alcohol use: No    Alcohol/week: 0.0 oz  . Drug use: No    Review of  Systems  Constitutional: No fever/chills Eyes: No visual changes. ENT: No sore throat. Cardiovascular: Denies chest pain. Respiratory: Denies shortness of breath. Gastrointestinal: No abdominal pain.   no vomiting.  No diarrhea.  No constipation. Genitourinary: Negative for dysuria. Musculoskeletal: Negative for back pain. Skin: Negative for rash. Neurological: Negative for focal weakness or numbness.   ____________________________________________   PHYSICAL EXAM:  VITAL SIGNS: ED Triage Vitals [11/10/17 1638]  Enc Vitals Group     BP      Pulse      Resp      Temp      Temp src      SpO2 98 %     Weight      Height      Head Circumference      Peak Flow      Pain Score      Pain Loc      Pain Edu?      Excl. in [place]?     Constitutional: Alert and oriented. Well appearing and in no acute distress. Eyes: Conjunctivae are normal.  Head: Atraumatic. Nose: No congestion/rhinnorhea. Mouth/Throat: Mucous membranes are moist.  No tongue laceration identified.   Neck: No stridor.   Cardiovascular: Normal rate, regular rhythm. Grossly normal heart sounds.  Respiratory: Normal respiratory effort.  No retractions. Lungs CTAB. Gastrointestinal: Soft and nontender. No distention. Musculoskeletal: No lower extremity tenderness nor edema.  No joint effusions. Neurologic:  Normal speech and language. No gross focal neurologic deficits are appreciated. Skin:  Skin is warm, dry and intact. No rash noted. Psychiatric: Mood and affect are normal. Speech and behavior are normal.  ____________________________________________   LABS (all labs ordered are listed, but only abnormal results are displayed)  Labs Reviewed  URINE DRUG SCREEN, QUALITATIVE ([facility] ONLY) - Abnormal; Notable for the following components:      Result Value   Opiate, Ur Screen POSITIVE (*)    Barbiturates, Ur Screen   (*)  Value: Result not available. Reagent lot number recalled by manufacturer.   All other  components within normal limits  CBC WITH DIFFERENTIAL/PLATELET - Abnormal; Notable for the following components:   Hemoglobin 10.5 (*)    HCT 32.1 (*)    MCV 78.7 (*)    MCH 25.8 (*)    RDW 16.3 (*)    All other components within normal limits  COMPREHENSIVE METABOLIC PANEL - Abnormal; Notable for the following components:   Potassium 2.5 (*)    Chloride 92 (*)    CO2 33 (*)    Glucose, Bld 156 (*)    BUN 52 (*)    Creatinine, Ser 1.71 (*)    Alkaline Phosphatase 156 (*)    GFR calc non Af Amer 29 (*)    GFR calc Af Amer 34 (*)    All other components within normal limits  URINALYSIS, COMPLETE (UACMP) WITH MICROSCOPIC - Abnormal; Notable for the following components:   Color, Urine STRAW (*)    APPearance CLEAR (*)    All other components within normal limits  TROPONIN I  MAGNESIUM   ____________________________________________  EKG  ED ECG REPORT I, Doran Stabler, the attending physician, personally viewed and interpreted this ECG.   Date: 11/10/2017  EKG Time: 1646  Rate: 65  Rhythm: normal sinus rhythm  Axis: Normal  Intervals:Incomplete right bundle branch block.  Prolonged QT C at 558  ST&T Change: No ST segment elevation or depression.  T wave inversions in 1 and aVL.  No significant change from previous EKGs except for the now prolonged QT.  ED ECG REPORT I, Doran Stabler, the attending physician, personally viewed and interpreted this ECG.   Date: 11/10/2017  EKG Time: 1755  Rate: 63  Rhythm: normal sinus rhythm  Axis: Borderline left axis  Intervals:Prolonged QTC at 544  ST&T Change: T wave inversions in 1 and aVL.  No ST segment elevation or depression.  ____________________________________________  RADIOLOGY  No acute finding on the CT of the head neck. ____________________________________________   PROCEDURES  Procedure(s) performed:   Procedures  Critical Care performed:    ____________________________________________   INITIAL IMPRESSION / ASSESSMENT AND PLAN / ED COURSE  Pertinent labs & imaging results that were available during my care of the patient were reviewed by me and considered in my medical decision making (see chart for details).  Differential diagnosis includes, but is not limited to, intracranial hemorrhage, meningitis/encephalitis, previous head trauma, cavernous venous thrombosis, tension headache, temporal arteritis, migraine or migraine equivalent, idiopathic intracranial hypertension, and non-specific headache. As part of my medical decision making, I reviewed the following data within the electronic MEDICAL RECORD NUMBER Notes from prior ED visits  ----------------------------------------- 7:52 PM on 11/10/2017 -----------------------------------------  Patient persistently weak and says she feels like she cannot stand due to feeling like she will "fall forward."  Patient also with a potassium of 2.5.  Will admit to the hospital for postictal state as well as potassium repletion.  Signed out to Dr. Darvin Neighbours.  We will also give IV Keppra.  2 seizures in the last 2 days. ____________________________________________   FINAL CLINICAL IMPRESSION(S) / ED DIAGNOSES  Seizure.  Hypokalemia.  Postictal state.   NEW MEDICATIONS STARTED DURING THIS VISIT:  New Prescriptions   No medications on file     Note:  This document was prepared using Dragon voice recognition software and may include unintentional dictation errors.     Orbie Pyo, MD 11/10/17 [phone]

## 2017-11-10 NOTE — ED Notes (Signed)
Sherrie RN, aware of bed assigned 

## 2017-11-10 NOTE — ED Triage Notes (Signed)
Pt to the er for seizure. Pt had a seizure on Saturday and was not seen. Pt says her boyfriend stated she had a seizure and was on the floor for 45 minutes before he called 911. Son walked mother out to stretcher. Pt not making eye contact. C/O of nausea and weakness.

## 2017-11-11 DIAGNOSIS — G40909 Epilepsy, unspecified, not intractable, without status epilepticus: Secondary | ICD-10-CM | POA: Diagnosis not present

## 2017-11-11 DIAGNOSIS — R569 Unspecified convulsions: Secondary | ICD-10-CM | POA: Diagnosis not present

## 2017-11-11 LAB — GLUCOSE, CAPILLARY
Glucose-Capillary: 272 mg/dL — ABNORMAL HIGH (ref 70–99)
Glucose-Capillary: 345 mg/dL — ABNORMAL HIGH (ref 70–99)
Glucose-Capillary: 281 mg/dL — ABNORMAL HIGH (ref 70–99)
Glucose-Capillary: 287 mg/dL — ABNORMAL HIGH (ref 70–99)

## 2017-11-11 LAB — BASIC METABOLIC PANEL
ANION GAP: 13 (ref 5–15)
BUN: 51 mg/dL — ABNORMAL HIGH (ref 8–23)
CALCIUM: 8.9 mg/dL (ref 8.9–10.3)
CO2: 30 mmol/L (ref 22–32)
Chloride: 94 mmol/L — ABNORMAL LOW (ref 98–111)
Creatinine, Ser: 1.49 mg/dL — ABNORMAL HIGH (ref 0.44–1.00)
GFR calc Af Amer: 40 mL/min — ABNORMAL LOW (ref >60)
GFR calc non Af Amer: 34 mL/min — ABNORMAL LOW (ref >60)
GLUCOSE: 219 mg/dL — AB (ref 70–99)
POTASSIUM: 2.7 mmol/L — AB (ref 3.5–5.1)
Sodium: 137 mmol/L (ref 135–145)

## 2017-11-11 LAB — CBC
HCT: 33.9 % — ABNORMAL LOW (ref 35.0–47.0)
Hemoglobin: 10.8 g/dL — ABNORMAL LOW (ref 12.0–16.0)
MCH: 25.6 pg — AB (ref 26.0–34.0)
MCHC: 32 g/dL (ref 32.0–36.0)
MCV: 80 fL (ref 80.0–100.0)
PLATELETS: 263 10*3/uL (ref 150–440)
RBC: 4.24 MIL/uL (ref 3.80–5.20)
RDW: 16.7 % — AB (ref 11.5–14.5)
WBC: 6.2 10*3/uL (ref 3.6–11.0)

## 2017-11-11 LAB — POTASSIUM: POTASSIUM: 4.7 mmol/L (ref 3.5–5.1)

## 2017-11-11 LAB — MAGNESIUM: Magnesium: 2.2 mg/dL (ref 1.7–2.4)

## 2017-11-11 MED ORDER — ENOXAPARIN SODIUM 40 MG/0.4ML ~~LOC~~ SOLN
40.0000 mg | Freq: Two times a day (BID) | SUBCUTANEOUS | Status: DC
  Administered 2017-11-11 – 2017-11-12 (×3): 40 mg via SUBCUTANEOUS
  Filled 2017-11-11 (×3): qty 0.4

## 2017-11-11 MED ORDER — LEVETIRACETAM 500 MG PO TABS
1000.0000 mg | ORAL_TABLET | Freq: Two times a day (BID) | ORAL | Status: DC
  Administered 2017-11-11 – 2017-11-12 (×2): 1000 mg via ORAL
  Filled 2017-11-11 (×3): qty 2

## 2017-11-11 NOTE — Care Management Obs Status (Signed)
[facility] NOTIFICATION   Patient Details  Name: REFUGIO MCCONICO MRN: [id number] Date of Birth: 08/10/1945   Medicare Observation Status Notification Given:  Yes. Permission to x    Shelbie Ammons, RN 11/11/2017, 8:28 AM

## 2017-11-11 NOTE — Progress Notes (Signed)
Advanced care plan. Purpose of the Encounter: CODE STATUS Parties in Attendance: Patient Patient's Decision Capacity: Good Subjective/Patient's story: Presented to emergency room with seizure Objective/Medical story Has breakthrough seizure on oral Keppra Goals of care determination:  Advance care directives and goals of care discussed For now patient wants everything done which includes cardiac resuscitation, intubation and ventilator if need arises CODE STATUS: Full code Time spent discussing advanced care planning: 16 minutes

## 2017-11-11 NOTE — Consult Note (Addendum)
Reason for Consult:Seizures Referring Physician: Pyreddy  CC: Seizures  HPI: Tina Hayes is an 72 y.o. female with a history of seizures since the age of 65.  Reports that she was doing fairly well on Keppra and had not had a seizure in a little over 2 years.  On Saturday had her first breakthrough seizure.  Has had multiple seizures since that time.  Reports that since Sunday has had the onset of various focal neurological symptoms to include left facial and arm numbness, [facility].  These are not typical postictal symptoms for her.    Past Medical History:  Diagnosis Date  . (HFpEF) heart failure with preserved ejection fraction ([place])    a. 05/2016 Echo: EF 60-65%, mild to mod LVH, Gr1 DD, mild MR, mildly dil LA, mod TR, mildly to mod increased PASP.  [name] Acute diastolic heart failure ([place]) 01/27/2017  . Anxiety   . Chest pain 06/16/2016  . CHF (congestive heart failure) ([place])   . Chronic back pain   . Chronic diastolic congestive heart failure ([place]) 02/13/2017  . COPD (chronic obstructive pulmonary disease) ([place])   . Coronary artery disease    a. s/p remote PCI x 5;  b. 2006 s/p CABG x 3 ([place]); b. 05/2016 MV: attenuation corrected images w/o ischemia or wma-->Med rx.  . Coronary artery disease of native artery of native heart with stable angina pectoris ([place]) 06/17/2016  . Depression   . Diabetes mellitus without complication ([place])   . Essential hypertension 06/30/2016  . Heart attack ([place])    Total of 3 per pt.  . Hypertension   . Hypertensive urgency 06/03/2015  . Seizures ([place])     Past Surgical History:  Procedure Laterality Date  . ANKLE SURGERY    . COLONOSCOPY WITH PROPOFOL N/A 05/04/2017   Procedure: COLONOSCOPY WITH PROPOFOL;  Surgeon: [name], MD;  Location: [facility] ENDOSCOPY;  Service: Endoscopy;  Laterality: N/A;  . CORONARY ANGIOPLASTY    . CORONARY ARTERY BYPASS GRAFT    . ESOPHAGOGASTRODUODENOSCOPY (EGD) WITH PROPOFOL N/A 05/04/2017    Procedure: ESOPHAGOGASTRODUODENOSCOPY (EGD) WITH PROPOFOL;  Surgeon: [name], MD;  Location: [facility] ENDOSCOPY;  Service: Endoscopy;  Laterality: N/A;  . FRACTURE SURGERY    . KNEE SURGERY    . MOUTH SURGERY Left 07/14/2017    Family History  Problem Relation Age of Onset  . Diabetes Unknown   . Hypertension Unknown   . Diabetes Mother   . Heart failure Mother   . Heart disease Mother   . Heart attack Mother   . Stroke Mother   . Depression Mother   . Hypertension Mother   . Cancer Sister        brain  . Hypertension Sister   . Diabetes Brother   . Hypertension Brother   . Heart failure Sister   . Heart attack Sister   . SIDS Sister     Social History:  reports that she quit smoking about 11 years ago. Her smoking use included cigarettes. She has a 5.00 pack-year smoking history. She has never used smokeless tobacco. She reports that she does not drink alcohol or use drugs.  Allergies  Allergen Reactions  . Aspirin Anaphylaxis    Medications:  I have reviewed the patient's current medications. Prior to Admission:  Medications Prior to Admission  Medication Sig Dispense Refill Last Dose  . atorvastatin (LIPITOR) 40 MG tablet Take 40 mg at bedtime by mouth.  11/09/2017 at Unknown time  . Calcium Carbonate-Vitamin D3 (CALCIUM 600-D) 600-400 MG-UNIT TABS Take 1 tablet 2 (two) times Hayes by mouth.   11/10/2017 at Unknown time  . carvedilol (COREG) 3.125 MG tablet Take 1 tablet (3.125 mg total) by mouth 2 (two) times Hayes with a meal. 180 tablet 3 11/10/2017 at Unknown time  . citalopram (CELEXA) 20 MG tablet Take 20 mg by mouth at bedtime.    11/10/2017 at Unknown time  . esomeprazole (NEXIUM) 40 MG capsule Take 40 mg Hayes at 12 noon by mouth.   11/09/2017 at Unknown time  . insulin aspart (NOVOLOG) 100 UNIT/ML injection Inject 5-10 Units 2 (two) times Hayes with a meal into the skin. Take 10 units in the morning and 5 units in the evening with meal   11/10/2017 at  Unknown time  . insulin glargine (LANTUS) 100 UNIT/ML injection Inject 30-40 Units 2 (two) times Hayes into the skin. Inject 30 units in the morning at 40 units at bedtime.   11/10/2017 at Unknown time  . isosorbide mononitrate (IMDUR) 30 MG 24 hr tablet Take 1 tablet (30 mg total) by mouth 2 (two) times Hayes. 180 tablet 3 11/10/2017 at Unknown time  . levETIRAcetam (KEPPRA) 750 MG tablet Take 1 tablet (750 mg total) by mouth 2 (two) times Hayes. 60 tablet 3 11/10/2017 at Unknown time  . meloxicam (MOBIC) 7.5 MG tablet Take 7.5 mg Hayes by mouth.   11/10/2017 at Unknown time  . montelukast (SINGULAIR) 10 MG tablet Take 1 tablet by mouth Hayes.   11/09/2017 at Unknown time  . potassium chloride SA (K-DUR,KLOR-CON) 20 MEQ tablet Take 2 tablets (40 mEq total) by mouth 3 (three) times Hayes. Take an extra 2 tablets (40 meq) by mouth on the days you take metolazone   11/10/2017 at Unknown time  . tiotropium (SPIRIVA) 18 MCG inhalation capsule Place 18 mcg into inhaler and inhale Hayes.   11/10/2017 at Unknown time  . torsemide (DEMADEX) 20 MG tablet Take 3 tablets (60 mg total) by mouth 2 (two) times Hayes.   11/10/2017 at Unknown time  . traZODone (DESYREL) 50 MG tablet Take 50 mg by mouth at bedtime.   11/09/2017 at Unknown time  . ACCU-CHEK AVIVA PLUS test strip    Taking  . albuterol (PROVENTIL HFA;VENTOLIN HFA) 108 (90 Base) MCG/ACT inhaler Inhale 2 puffs into the lungs every 6 (six) hours as needed for wheezing or shortness of breath. 1 Inhaler 2 PRN at PRN  . albuterol (PROVENTIL) (2.5 MG/3ML) 0.083% nebulizer solution Take 2.5 mg every 4 (four) hours as needed by nebulization for wheezing or shortness of breath.   PRN at PRN  . Blood Glucose Monitoring Suppl (ACCU-CHEK AVIVA PLUS) w/Device KIT    Taking  . clopidogrel (PLAVIX) 75 MG tablet Take 1 tablet (75 mg total) by mouth Hayes. (Patient not taking: Reported on 11/10/2017) 30 tablet 0 Not Taking at Unknown time  . HYDROcodone-acetaminophen  (NORCO/VICODIN) 5-325 MG tablet Take 1 tablet by mouth 2 (two) times Hayes. 60 tablet 0 PRN at PRN  . metolazone (ZAROXOLYN) 2.5 MG tablet Take 1 tablet (2.5 mg) by mouth once Hayes as needed for a weigh > 320 lbs, take 30 minutes prior to you first dose of torsemide 30 tablet 0 PRN at PRN  . nitroGLYCERIN (NITROSTAT) 0.4 MG SL tablet DISSOLVE ONE TABLET UNDER THE TONGUE EVERY 5 MINUTES AS NEEDED FOR CHEST PAIN.  DO NOT EXCEED A TOTAL OF 3 DOSES IN 15 MINUTES 25  tablet 0 PRN at PRN  . UNIFINE PENTIPS 31G X 6 MM MISC    Taking   Scheduled: . atorvastatin  40 mg Oral QHS  . carvedilol  3.125 mg Oral BID WC  . citalopram  20 mg Oral QHS  . enoxaparin (LOVENOX) injection  40 mg Subcutaneous Q12H  . HYDROcodone-acetaminophen  1 tablet Oral BID  . insulin aspart  0-5 Units Subcutaneous QHS  . insulin aspart  0-9 Units Subcutaneous TID WC  . insulin glargine  25 Units Subcutaneous BID  . isosorbide mononitrate  30 mg Oral BID  . levETIRAcetam  1,000 mg Oral BID  . montelukast  10 mg Oral Hayes  . pantoprazole  40 mg Oral Hayes  . potassium chloride SA  40 mEq Oral TID  . tiotropium  18 mcg Inhalation q morning - 10a  . torsemide  60 mg Oral BID  . traZODone  50 mg Oral QHS    ROS: History obtained from the patient  General ROS: negative for - chills, fatigue, fever, night sweats, weight gain or weight loss Psychological ROS: negative for - behavioral disorder, hallucinations, memory difficulties, mood swings or suicidal ideation Ophthalmic ROS: negative for - blurry vision, double vision, eye pain or loss of vision ENT ROS: negative for - epistaxis, nasal discharge, oral lesions, sore throat, tinnitus or vertigo Allergy and Immunology ROS: negative for - hives or itchy/watery eyes Hematological and Lymphatic ROS: negative for - bleeding problems, bruising or swollen lymph nodes Endocrine ROS: negative for - galactorrhea, hair pattern changes, polydipsia/polyuria or temperature  intolerance Respiratory ROS: negative for - cough, hemoptysis, shortness of breath or wheezing Cardiovascular ROS: negative for - chest pain, dyspnea on exertion, edema or irregular heartbeat Gastrointestinal ROS: negative for - abdominal pain, diarrhea, hematemesis, nausea/vomiting or stool incontinence Genito-Urinary ROS: negative for - dysuria, hematuria, incontinence or urinary frequency/urgency Musculoskeletal ROS: left knee pain, left neck pain Neurological ROS: as noted in HPI Dermatological ROS: negative for rash and skin lesion changes  Physical Examination: Blood pressure (!) 148/75, pulse 88, temperature 98.3 F (36.8 C), temperature source Oral, resp. rate 18, height '5\' 11"'$  (1.803 m), weight (!) 142.9 kg (315 lb 0.6 oz), SpO2 99 %.  HEENT-  Normocephalic, no lesions, without obvious abnormality.  Normal external eye and conjunctiva.  Normal TM's bilaterally.  Normal auditory canals and external ears. Normal external nose, mucus membranes and septum.  Normal pharynx. Cardiovascular- S1, S2 normal, pulses palpable throughout   Lungs- chest clear, no wheezing, rales, normal symmetric air entry Abdomen- soft, non-tender; bowel sounds normal; no masses,  no organomegaly Extremities- no edema Lymph-no adenopathy palpable Musculoskeletal-no joint tenderness, deformity or swelling Skin-warm and dry, no hyperpigmentation, vitiligo, or suspicious lesions  Neurological Examination   Mental Status: Alert, oriented, thought content appropriate.  Speech fluent without evidence of aphasia.  Able to follow 3 step commands without difficulty. Cranial Nerves: II: Discs flat bilaterally; RHH, pupils equal, round, reactive to light and accommodation III,IV, VI: ptosis not present, extra-ocular motions intact bilaterally V,VII: decrease in right NLF, facial light touch sensation decreased on the left VIII: hearing normal bilaterally IX,X: gag reflex present XI: bilateral shoulder shrug XII:  midline tongue extension Motor: Right : Upper extremity   5/5    Left:     Upper extremity   5/5  Lower extremity   5/5     Lower extremity   5/5 Tone and bulk:normal tone throughout; no atrophy noted Sensory: Pinprick and light touch decreased in the  LUE Deep Tendon Reflexes: 2+ and symmetric with absent AJ's bilaterally Plantars: Right: downgoing   Left: downgoing Cerebellar: Normal finger-to-nose and normal heel-to-shin testing bilaterally Gait: not tested due to safety concerns   Laboratory Studies:   Basic Metabolic Panel: Recent Labs  Lab 11/10/17 1653 11/11/17 0501  NA 139 137  K 2.5* 2.7*  CL 92* 94*  CO2 33* 30  GLUCOSE 156* 219*  BUN 52* 51*  CREATININE 1.71* 1.49*  CALCIUM 8.9 8.9  MG 1.9  --     Liver Function Tests: Recent Labs  Lab 11/10/17 1653  AST 20  ALT 14  ALKPHOS 156*  BILITOT 0.8  PROT 7.8  ALBUMIN 3.9   No results for input(s): LIPASE, AMYLASE in the last 168 hours. No results for input(s): AMMONIA in the last 168 hours.  CBC: Recent Labs  Lab 11/10/17 1653 11/11/17 0501  WBC 6.2 6.2  NEUTROABS 3.3  --   HGB 10.5* 10.8*  HCT 32.1* 33.9*  MCV 78.7* 80.0  PLT 279 263    Cardiac Enzymes: Recent Labs  Lab 11/10/17 1653  TROPONINI <0.03    BNP: Invalid input(s): POCBNP  CBG: Recent Labs  Lab 11/10/17 2146 11/11/17 0749 11/11/17 1157  GLUCAP 119* 272* 74*    Microbiology: Results for orders placed or performed during the hospital encounter of 08/28/15  Culture, blood (routine x 2)     Status: Abnormal   Collection Time: 08/28/15  8:03 AM  Result Value Ref Range Status   Specimen Description BLOOD RIGHT AC  Final   Special Requests BOTTLES DRAWN AEROBIC AND ANAEROBIC 1ML  Final   Culture  Setup Time   Final    GRAM POSITIVE RODS AEROBIC BOTTLE ONLY CRITICAL RESULT CALLED TO, READ BACK BY AND VERIFIED WITH: JASON ROBBINS at 2120 08/30/15 KLK. Organism ID to follow    Culture (A)  Final     DIPHTHEROIDS(CORYNEBACTERIUM SPECIES) Standardized susceptibility testing for this organism is not available. Results consistent with contamination.    Report Status 09/02/2015 FINAL  Final  Blood Culture ID Panel (Reflexed)     Status: None   Collection Time: 08/28/15  8:03 AM  Result Value Ref Range Status   Enterococcus species NOT DETECTED NOT DETECTED Final   Vancomycin resistance NOT DETECTED NOT DETECTED Final   Listeria monocytogenes NOT DETECTED NOT DETECTED Final   Staphylococcus species NOT DETECTED NOT DETECTED Final   Staphylococcus aureus NOT DETECTED NOT DETECTED Final   Methicillin resistance NOT DETECTED NOT DETECTED Final   Streptococcus species NOT DETECTED NOT DETECTED Final   Streptococcus agalactiae NOT DETECTED NOT DETECTED Final   Streptococcus pneumoniae NOT DETECTED NOT DETECTED Final   Streptococcus pyogenes NOT DETECTED NOT DETECTED Final   Acinetobacter baumannii NOT DETECTED NOT DETECTED Final   Enterobacteriaceae species NOT DETECTED NOT DETECTED Final   Enterobacter cloacae complex NOT DETECTED NOT DETECTED Final   Escherichia coli NOT DETECTED NOT DETECTED Final   Klebsiella oxytoca NOT DETECTED NOT DETECTED Final   Klebsiella pneumoniae NOT DETECTED NOT DETECTED Final   Proteus species NOT DETECTED NOT DETECTED Final   Serratia marcescens NOT DETECTED NOT DETECTED Final   Carbapenem resistance NOT DETECTED NOT DETECTED Final   Haemophilus influenzae NOT DETECTED NOT DETECTED Final   Neisseria meningitidis NOT DETECTED NOT DETECTED Final   Pseudomonas aeruginosa NOT DETECTED NOT DETECTED Final   Candida albicans NOT DETECTED NOT DETECTED Final   Candida glabrata NOT DETECTED NOT DETECTED Final   Candida krusei NOT DETECTED  NOT DETECTED Final   Candida parapsilosis NOT DETECTED NOT DETECTED Final   Candida tropicalis NOT DETECTED NOT DETECTED Final  Culture, blood (routine x 2)     Status: None   Collection Time: 08/28/15  8:04 AM  Result Value Ref  Range Status   Specimen Description BLOOD LEFT AC  Final   Special Requests   Final    BOTTLES DRAWN AEROBIC AND ANAEROBIC AER 1ML ANA .5ML   Culture NO GROWTH 5 DAYS  Final   Report Status 09/02/2015 FINAL  Final    Coagulation Studies: No results for input(s): LABPROT, INR in the last 72 hours.  Urinalysis:  Recent Labs  Lab 11/10/17 1647  COLORURINE STRAW*  LABSPEC 1.008  PHURINE 7.0  GLUCOSEU NEGATIVE  HGBUR NEGATIVE  BILIRUBINUR NEGATIVE  KETONESUR NEGATIVE  PROTEINUR NEGATIVE  NITRITE NEGATIVE  LEUKOCYTESUR NEGATIVE    Lipid Panel:     Component Value Date/Time   CHOL 120 01/30/2017 0404   TRIG 73 01/30/2017 0404   HDL 47 01/30/2017 0404   CHOLHDL 2.6 01/30/2017 0404   VLDL 15 01/30/2017 0404   LDLCALC 58 01/30/2017 0404    HgbA1C:  Lab Results  Component Value Date   HGBA1C 7.7 (H) 08/16/2017    Urine Drug Screen:      Component Value Date/Time   LABOPIA POSITIVE (A) 11/10/2017 1653   COCAINSCRNUR NONE DETECTED 11/10/2017 1653   LABBENZ NONE DETECTED 11/10/2017 1653   AMPHETMU NONE DETECTED 11/10/2017 1653   THCU NONE DETECTED 11/10/2017 1653   LABBARB (A) 11/10/2017 1653    Result not available. Reagent lot number recalled by manufacturer.    Alcohol Level: No results for input(s): ETH in the last 168 hours.  Other results: EKG: sinus rhythm at 65 bpm, ventricular trigmeny.  Imaging: Ct Head Wo Contrast  Result Date: 11/10/2017 CLINICAL DATA:  72 year old female with seizure 3 days ago. Complaining and nausea weakness. Initial encounter. EXAM: CT HEAD WITHOUT CONTRAST CT CERVICAL SPINE WITHOUT CONTRAST TECHNIQUE: Multidetector CT imaging of the head and cervical spine was performed following the standard protocol without intravenous contrast. Multiplanar CT image reconstructions of the cervical spine were also generated. COMPARISON:  None. FINDINGS: CT HEAD FINDINGS Brain: No intracranial hemorrhage or CT evidence of large acute infarct. Mild  chronic microvascular changes. Mild global atrophy. No intracranial mass lesion noted on this unenhanced exam. Vascular: Vascular calcifications Skull: No skull fracture Sinuses/Orbits: No acute orbital abnormality. Visualized paranasal sinuses are clear. Other: Mastoid air cells and middle ear cavities are clear. CT CERVICAL SPINE FINDINGS Alignment: Straightening of the cervical spine. Minimal increased distance C5-6 interspinous region without other findings to suggest ligamentous injury. Skull base and vertebrae: Evaluation limited by patient's habitus. No obvious fracture. Soft tissues and spinal canal: No abnormal prevertebral soft tissue swelling identified. 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 Upper chest: No upper lung mass identified. Other: Carotid bifurcation calcifications. IMPRESSION: No skull fracture, intracranial hemorrhage or seizure focus identified on this unenhanced exam. Cervical spine CT limited by patient's habitus. No obvious fracture. Minimal widening of the C5-6 interspinous region without other findings to suggest ligamentous injury. 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 If ligamentous injury or cord injury were of high clinical concern and further delineation were clinically desired then MR may be considered. Electronically Signed   By: Genia Del M.D.   On: 11/10/2017 17:28   Ct Cervical Spine Wo Contrast  Result  Date: 11/10/2017 CLINICAL DATA:  72 year old female with seizure 3 days ago. Complaining and nausea weakness. Initial encounter. EXAM: CT HEAD WITHOUT CONTRAST CT CERVICAL SPINE WITHOUT CONTRAST TECHNIQUE: Multidetector CT imaging of the head and cervical spine was performed following the standard protocol without intravenous contrast. Multiplanar CT image reconstructions of the cervical spine were also generated. COMPARISON:  None. FINDINGS: CT  HEAD FINDINGS Brain: No intracranial hemorrhage or CT evidence of large acute infarct. Mild chronic microvascular changes. Mild global atrophy. No intracranial mass lesion noted on this unenhanced exam. Vascular: Vascular calcifications Skull: No skull fracture Sinuses/Orbits: No acute orbital abnormality. Visualized paranasal sinuses are clear. Other: Mastoid air cells and middle ear cavities are clear. CT CERVICAL SPINE FINDINGS Alignment: Straightening of the cervical spine. Minimal increased distance C5-6 interspinous region without other findings to suggest ligamentous injury. Skull base and vertebrae: Evaluation limited by patient's habitus. No obvious fracture. Soft tissues and spinal canal: No abnormal prevertebral soft tissue swelling identified. 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 Upper chest: No upper lung mass identified. Other: Carotid bifurcation calcifications. IMPRESSION: No skull fracture, intracranial hemorrhage or seizure focus identified on this unenhanced exam. Cervical spine CT limited by patient's habitus. No obvious fracture. Minimal widening of the C5-6 interspinous region without other findings to suggest ligamentous injury. 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 If ligamentous injury or cord injury were of high clinical concern and further delineation were clinically desired then MR may be considered. Electronically Signed   By: Genia Del M.D.   On: 11/10/2017 17:28     Assessment/Plan: 72 year old female presenting with breakthrough seizures.  It is quite concerning that the patient has had adequate seizure control for the past two years and now has had multiple seizures and new focal neurological complaints.  She reports being compliant with medication.  UDS positive for opiates.  I do not see an opiate prescription in med review.  Head CT reviewed and  shows no acute changes.  CT of the cervical spine shows multilevel spondylotic changes and varying degrees of spinal stenosis.    Recommendations: 1.  Increase Keppra to 1033m BID 2.  MRI of the brain with and without contrast 3.  Seizure precautions 4.  Ativan prn seizure activity 5.  Patient unable to drive, operate heavy machinery, perform activities at heights and participate in water activities until release by outpatient physician.    LAlexis Goodell MD Neurology [phone]/17/2019, 1:31 PM
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
## 2017-11-11 NOTE — Progress Notes (Signed)
PHARMACIST - PHYSICIAN COMMUNICATION  CONCERNING:  Enoxaparin (Lovenox) for DVT Prophylaxis    RECOMMENDATION: Patient was prescribed enoxaprin 40mg  q24 hours for VTE prophylaxis.   Filed Weights   11/10/17 1647 11/10/17 2118 11/11/17 0419  Weight: (!) 303 lb (137.4 kg) (!) 311 lb 11.7 oz (141.4 kg) (!) 315 lb 0.6 oz (142.9 kg)    Body mass index is 43.94 kg/m.  Estimated Creatinine Clearance: 55.2 mL/min (A) (by C-G formula based on SCr of 1.49 mg/dL (H)).   Based on [place] patient is candidate for enoxaparin 40mg  every 12 hour dosing due to BMI being >40.  DESCRIPTION: Pharmacy has adjusted enoxaparin dose per [place], approved through [place] committee.  Patient is now receiving enoxaparin 40mg  every 12 hours.   Pernell Dupre, PharmD, BCPS Clinical Pharmacist 11/11/2017 7:46 AM

## 2017-11-11 NOTE — Progress Notes (Signed)
Text paged hospitalist to notify of critical lab value: potassium 2.7; up from 2.5

## 2017-11-11 NOTE — Plan of Care (Signed)
  Problem: Spiritual Needs Goal: Ability to function at adequate level Outcome: Progressing   Problem: Education: Goal: Knowledge of General Education information will improve Outcome: Progressing   Problem: Nutrition: Goal: Adequate nutrition will be maintained Outcome: Progressing   Problem: Elimination: Goal: Will not experience complications related to urinary retention Outcome: Progressing   Problem: Pain Managment: Goal: General experience of comfort will improve Outcome: Progressing   Problem: Safety: Goal: Ability to remain free from injury will improve Outcome: Progressing   Problem: Skin Integrity: Goal: Risk for impaired skin integrity will decrease Outcome: Progressing

## 2017-11-11 NOTE — Care Management Note (Signed)
Case Management Note  Patient Details  Name: Tina Hayes MRN: [id number] Date of Birth: 1946/02/06  Subjective/Objective: Admitted to [facility] under observation status with the diagnosis of seizures. Son, Kathaleen Bury is in the home. Friend is [place] [phone]). Dr. Ancil Boozer is listed as primary care physician. States she has seen a physician since her last visit here 08/16/17. Prescriptions are filled at [facility] on [address], No home health. No skilled  Facility. No home oxygen. Uses nebulizer in the home and has cane. Goes to congestive heart failure clinic. Fell yesterday. States she has lost about 20 pounds since April 2019. Takes care of all basic activities of daily living herself, doesn't drive. Sons help with errands                Action/Plan: Will continue to follow for discharge plans, if needed   Expected Discharge Date:                  Expected Discharge Plan:     In-House Referral:     Discharge planning Services     Post Acute Care Choice:    Choice offered to:     DME Arranged:    DME Agency:     HH Arranged:    [place] Agency:     Status of Service:     If discussed at [employer], dates discussed:    Additional Comments:  Shelbie Ammons, RN MSN CCM Care Management [phone] 11/11/2017, 8:36 AM

## 2017-11-11 NOTE — Progress Notes (Signed)
[place] [facility] [place] NAME: Tina Hayes    MR#:  [id number]  DATE OF BIRTH:  Nov 25, 1945  SUBJECTIVE:  CHIEF COMPLAINT:   Chief Complaint  Patient presents with  . Seizures  Patient seen and evaluated today  no new episodes of seizures Tolerating diet well No headaches   REVIEW OF SYSTEMS:    ROS  CONSTITUTIONAL: No documented fever. No fatigue, weakness. No weight gain, no weight loss.  EYES: No blurry or double vision.  ENT: No tinnitus. No postnasal drip. No redness of the oropharynx.  RESPIRATORY: No cough, no wheeze, no hemoptysis. No dyspnea.  CARDIOVASCULAR: No chest pain. No orthopnea. No palpitations. No syncope.  GASTROINTESTINAL: No nausea, no vomiting or diarrhea. No abdominal pain. No melena or hematochezia.  GENITOURINARY: No dysuria or hematuria.  ENDOCRINE: No polyuria or nocturia. No heat or cold intolerance.  HEMATOLOGY: No anemia. No bruising. No bleeding.  INTEGUMENTARY: No rashes. No lesions.  MUSCULOSKELETAL: No arthritis. No swelling. No gout.  NEUROLOGIC: No numbness, tingling, or ataxia. No seizure-type activity.  PSYCHIATRIC: No anxiety. No insomnia. No ADD.   DRUG ALLERGIES:   Allergies  Allergen Reactions  . Aspirin Anaphylaxis    VITALS:  Blood pressure (!) 148/75, pulse 88, temperature 98.3 F (36.8 C), temperature source Oral, resp. rate 18, height 5\' 11"  (1.803 m), weight (!) 142.9 kg (315 lb 0.6 oz), SpO2 99 %.  PHYSICAL EXAMINATION:   Physical Exam  GENERAL:  72 y.o.-year-old patient lying in the bed with no acute distress.  EYES: Pupils equal, round, reactive to light and accommodation. No scleral icterus. Extraocular muscles intact.  HEENT: Head atraumatic, normocephalic. Oropharynx and nasopharynx clear.  NECK:  Supple, no jugular venous distention. No thyroid enlargement, no tenderness.  LUNGS: Normal breath sounds bilaterally, no wheezing, rales, rhonchi. No use of accessory muscles of  respiration.  CARDIOVASCULAR: S1, S2 normal. No murmurs, rubs, or gallops.  ABDOMEN: Soft, nontender, nondistended. Bowel sounds present. No organomegaly or mass.  EXTREMITIES: No cyanosis, clubbing or edema b/l.    NEUROLOGIC: Cranial nerves II through XII are intact. No focal Motor or sensory deficits b/l.   PSYCHIATRIC: The patient is alert and oriented x 3.  SKIN: No obvious rash, lesion, or ulcer.   LABORATORY PANEL:   CBC Recent Labs  Lab 11/11/17 0501  WBC 6.2  HGB 10.8*  HCT 33.9*  PLT 263   ------------------------------------------------------------------------------------------------------------------ Chemistries  Recent Labs  Lab 11/10/17 1653 11/11/17 0501  NA 139 137  K 2.5* 2.7*  CL 92* 94*  CO2 33* 30  GLUCOSE 156* 219*  BUN 52* 51*  CREATININE 1.71* 1.49*  CALCIUM 8.9 8.9  MG 1.9  --   AST 20  --   ALT 14  --   ALKPHOS 156*  --   BILITOT 0.8  --    ------------------------------------------------------------------------------------------------------------------  Cardiac Enzymes Recent Labs  Lab 11/10/17 1653  TROPONINI <0.03   ------------------------------------------------------------------------------------------------------------------  RADIOLOGY:  Ct Head Wo Contrast  Result Date: 11/10/2017 CLINICAL DATA:  72 year old female with seizure 3 days ago. Complaining and nausea weakness. Initial encounter. EXAM: CT HEAD WITHOUT CONTRAST CT CERVICAL SPINE WITHOUT CONTRAST TECHNIQUE: Multidetector CT imaging of the head and cervical spine was performed following the standard protocol without intravenous contrast. Multiplanar CT image reconstructions of the cervical spine were also generated. COMPARISON:  None. FINDINGS: CT HEAD FINDINGS Brain: No intracranial hemorrhage or CT evidence of large acute infarct. Mild chronic microvascular changes. Mild global atrophy.  No intracranial mass lesion noted on this unenhanced exam. Vascular: Vascular  calcifications Skull: No skull fracture Sinuses/Orbits: No acute orbital abnormality. Visualized paranasal sinuses are clear. Other: Mastoid air cells and middle ear cavities are clear. CT CERVICAL SPINE FINDINGS Alignment: Straightening of the cervical spine. Minimal increased distance C5-6 interspinous region without other findings to suggest ligamentous injury. Skull base and vertebrae: Evaluation limited by patient's habitus. No obvious fracture. Soft tissues and spinal canal: No abnormal prevertebral soft tissue swelling identified. 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 Upper chest: No upper lung mass identified. Other: Carotid bifurcation calcifications. IMPRESSION: No skull fracture, intracranial hemorrhage or seizure focus identified on this unenhanced exam. Cervical spine CT limited by patient's habitus. No obvious fracture. Minimal widening of the C5-6 interspinous region without other findings to suggest ligamentous injury. 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 If ligamentous injury or cord injury were of high clinical concern and further delineation were clinically desired then MR may be considered. Electronically Signed   By: Genia Del M.D.   On: 11/10/2017 17:28   Ct Cervical Spine Wo Contrast  Result Date: 11/10/2017 CLINICAL DATA:  72 year old female with seizure 3 days ago. Complaining and nausea weakness. Initial encounter. EXAM: CT HEAD WITHOUT CONTRAST CT CERVICAL SPINE WITHOUT CONTRAST TECHNIQUE: Multidetector CT imaging of the head and cervical spine was performed following the standard protocol without intravenous contrast. Multiplanar CT image reconstructions of the cervical spine were also generated. COMPARISON:  None. FINDINGS: CT HEAD FINDINGS Brain: No intracranial hemorrhage or CT evidence of large acute infarct. Mild chronic microvascular changes. Mild  global atrophy. No intracranial mass lesion noted on this unenhanced exam. Vascular: Vascular calcifications Skull: No skull fracture Sinuses/Orbits: No acute orbital abnormality. Visualized paranasal sinuses are clear. Other: Mastoid air cells and middle ear cavities are clear. CT CERVICAL SPINE FINDINGS Alignment: Straightening of the cervical spine. Minimal increased distance C5-6 interspinous region without other findings to suggest ligamentous injury. Skull base and vertebrae: Evaluation limited by patient's habitus. No obvious fracture. Soft tissues and spinal canal: No abnormal prevertebral soft tissue swelling identified. 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 Upper chest: No upper lung mass identified. Other: Carotid bifurcation calcifications. IMPRESSION: No skull fracture, intracranial hemorrhage or seizure focus identified on this unenhanced exam. Cervical spine CT limited by patient's habitus. No obvious fracture. Minimal widening of the C5-6 interspinous region without other findings to suggest ligamentous injury. 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 If ligamentous injury or cord injury were of high clinical concern and further delineation were clinically desired then MR may be considered. Electronically Signed   By: Genia Del M.D.   On: 11/10/2017 17:28     ASSESSMENT AND PLAN:  72 year old female patient with history of seizure disorder currently under hospitalist service for breakthrough seizure  -Seizure disorder Status post neurology consultation Increase Keppra 1000 mg twice daily Check MRI brain  -Acute hypokalemia Replace potassium aggressively Follow potassium and magnesium level  -Chronic diastolic heart failure stable  -Type 2 diabetes mellitus Diabetic diet with sliding scale coverage with insulin  -DVT prophylaxis with [place] lovenox  daily  All the records are reviewed and case discussed with Care Management/Social Worker. Management plans discussed with the patient, family and they are in agreement.  CODE STATUS: Full code  DVT Prophylaxis: SCDs  TOTAL TIME TAKING CARE OF THIS PATIENT: 32  minutes.   POSSIBLE D/C IN 1 DAYS, DEPENDING ON CLINICAL CONDITION.  Saundra Shelling M.D on 11/11/2017 at 2:44 PM  Between 7am to 6pm - Pager - [phone]  After 6pm go to [email or web address] - password [email or web address] [facility]  Office  [phone]  CC: Primary care physician; Herminio Commons, MD  Note: This dictation was prepared with Dragon dictation along with smaller phrase technology. Any transcriptional errors that result from this process are unintentional.

## 2017-11-11 NOTE — Progress Notes (Signed)
PT Cancellation Note  Patient Details Name: Tina Hayes MRN: [id number] DOB: 1945/06/17   Cancelled Treatment:    Reason Eval/Treat Not Completed: Medical issues which prohibited therapy(Potassium level 2.7).  Per PT protocol, will hold until pt more medically appropriate for exertional activity.   Collie Siad PT, DPT 11/11/2017, 8:12 AM

## 2017-11-11 NOTE — Progress Notes (Signed)
[place] received an OR for patient needing spiritual/emotional support @ 2118 hours 7/16. Mrs. Dutkiewicz was admitted for uncontrolled s/z, suspected due to medication non-compliance. CH presented to the room at 0630 hours on 7/17, knocked on the door, identified me as [facility] and was welcomed in. patient was somnolent, with episodic periods of alertness, seemingly resting comfortably, Pastoral presence ensued, soft prayer conferred upon the patient, Patient assured that the [facility] would come back at a later time when she is more alert.
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
## 2017-11-12 ENCOUNTER — Observation Stay: Payer: Medicare HMO

## 2017-11-12 DIAGNOSIS — G40909 Epilepsy, unspecified, not intractable, without status epilepticus: Secondary | ICD-10-CM | POA: Diagnosis not present

## 2017-11-12 LAB — BASIC METABOLIC PANEL
Anion gap: 13 (ref 5–15)
BUN: 49 mg/dL — AB (ref 8–23)
CALCIUM: 9.1 mg/dL (ref 8.9–10.3)
CO2: 30 mmol/L (ref 22–32)
Chloride: 96 mmol/L — ABNORMAL LOW (ref 98–111)
Creatinine, Ser: 1.3 mg/dL — ABNORMAL HIGH (ref 0.44–1.00)
GFR calc Af Amer: 47 mL/min — ABNORMAL LOW (ref >60)
GFR, EST NON AFRICAN AMERICAN: 41 mL/min — AB (ref >60)
GLUCOSE: 217 mg/dL — AB (ref 70–99)
Potassium: 3.2 mmol/L — ABNORMAL LOW (ref 3.5–5.1)
Sodium: 139 mmol/L (ref 135–145)

## 2017-11-12 LAB — GLUCOSE, CAPILLARY
Glucose-Capillary: 265 mg/dL — ABNORMAL HIGH (ref 70–99)
Glucose-Capillary: 362 mg/dL — ABNORMAL HIGH (ref 70–99)

## 2017-11-12 MED ORDER — LEVETIRACETAM 1000 MG PO TABS: 1000.0000 mg | ORAL_TABLET | Freq: Two times a day (BID) | ORAL | 0 refills | Status: DC

## 2017-11-12 MED ORDER — GADOBENATE DIMEGLUMINE 529 MG/ML IV SOLN
10.0000 mL | Freq: Once | INTRAVENOUS | Status: AC | PRN
  Administered 2017-11-12: 01:00:00 10 mL via INTRAVENOUS

## 2017-11-12 NOTE — Care Management (Signed)
Discharge to home today per Dr. Estanislado Pandy. Physical therapy evaluation recommending outpatient therapy. Tina Hayes in agreement. Referral signed and faxed to [facility]. Shelbie Ammons RN MSN CCM Care Management [phone]

## 2017-11-12 NOTE — Progress Notes (Signed)
Discussed discharge instructions and medications with patient. IV removed. All questions addressed. Patient transported home via car by taxi.  Clarise Cruz, RN, BSN

## 2017-11-12 NOTE — Discharge Summary (Signed)
Tina Hayes NAME: Tina Hayes    MR#:  [id number]  DATE OF BIRTH:  [birth year]  DATE OF ADMISSION:  11/10/2017 ADMITTING PHYSICIAN: Hillary Bow, MD  DATE OF DISCHARGE: No discharge date for patient encounter.  PRIMARY CARE PHYSICIAN: Soles, Howell Rucks, MD   ADMISSION DIAGNOSIS:  Hypokalemia [E87.6] Seizure ([place]) [R56.9] Post-ictal state ([place]) [S06.3] Chronic diastolic heart failure Diabetes mellitus type 2 DISCHARGE DIAGNOSIS:  Active Problems:   Seizures ([place]) Acute hypokalemia Chronic diastolic heart failure Type 2 diabetes mellitus  SECONDARY DIAGNOSIS:   Past Medical History:  Diagnosis Date  . (HFpEF) heart failure with preserved ejection fraction ([place])    a. 05/2016 Echo: EF 60-65%, mild to mod LVH, Gr1 DD, mild MR, mildly dil LA, mod TR, mildly to mod increased PASP.  [name] Acute diastolic heart failure ([place]) 01/27/2017  . Anxiety   . Chest pain 06/16/2016  . CHF (congestive heart failure) ([place])   . Chronic back pain   . Chronic diastolic congestive heart failure ([place]) 02/13/2017  . COPD (chronic obstructive pulmonary disease) ([place])   . Coronary artery disease    a. s/p remote PCI x 5;  b. 2006 s/p CABG x 3 ([place], [place]); b. 05/2016 MV: attenuation corrected images w/o ischemia or wma-->Med rx.  . Coronary artery disease of native artery of native heart with stable angina pectoris ([place]) 06/17/2016  . Depression   . Diabetes mellitus without complication ([place])   . Essential hypertension 06/30/2016  . Heart attack ([place])    Total of 3 per pt.  . Hypertension   . Hypertensive urgency 06/03/2015  . Seizures ([place])      ADMITTING HISTORY Tina Hayes  is a 72 y.o. female with a known history of COPD, chronic diastolic CHF, anxiety, CAD, diabetes mellitus, hypertension, seizures on Keppra presents to the hospital due to 3 episodes of seizures.  Patient mentions that she has been compliant with  her Keppra.  Seizures were witnessed by family at home.  She mentions that her seizures normally start with a headache first and then she is has a seizure.  Follows with Dr. Manuella Ghazi of neurology at [facility].  CT scan of the head checked in the emergency room showed nothing acute.  Patient is being admitted for breakthrough seizures while being on her seizure medication.  IV Keppra 1 g given in the ED.   HOSPITAL COURSE:  Patient was initially loaded with 1 g of IV Keppra in the emergency room and admitted to medical floor.  His Keppra dose was increased to thousand milligrams twice daily by neurology attending after evaluation.  Patient was worked up with MRI brain which showed no acute abnormality.  CT head also showed no acute abnormality. Patient was also worked up with CT cervical spine which showed degenerative changes and stenosis. After the dose of Keppra has been increased and no new episodes of seizures. Patient tolerated diet well.   CONSULTS OBTAINED:  Treatment Team:  Alexis Goodell, MD  DRUG ALLERGIES:   Allergies  Allergen Reactions  . Aspirin Anaphylaxis    DISCHARGE MEDICATIONS:   Allergies as of 11/12/2017      Reactions   Aspirin Anaphylaxis      Medication List    TAKE these medications   ACCU-CHEK AVIVA PLUS test strip Generic drug:  glucose blood   ACCU-CHEK AVIVA PLUS w/Device Kit   albuterol (2.5 MG/3ML) 0.083% nebulizer solution Commonly known as:  PROVENTIL Take 2.5 mg every 4 (four) hours as needed by nebulization for wheezing or shortness of breath.   albuterol 108 (90 Base) MCG/ACT inhaler Commonly known as:  PROVENTIL HFA;VENTOLIN HFA Inhale 2 puffs into the lungs every 6 (six) hours as needed for wheezing or shortness of breath.   atorvastatin 40 MG tablet Commonly known as:  LIPITOR Take 40 mg at bedtime by mouth.   CALCIUM 600-D 600-400 MG-UNIT Tabs Generic drug:  Calcium Carbonate-Vitamin D3 Take 1 tablet 2 (two) times daily by  mouth.   carvedilol 3.125 MG tablet Commonly known as:  COREG Take 1 tablet (3.125 mg total) by mouth 2 (two) times daily with a meal.   citalopram 20 MG tablet Commonly known as:  CELEXA Take 20 mg by mouth at bedtime.   clopidogrel 75 MG tablet Commonly known as:  PLAVIX Take 1 tablet (75 mg total) by mouth daily.   esomeprazole 40 MG capsule Commonly known as:  NEXIUM Take 40 mg daily at 12 noon by mouth.   HYDROcodone-acetaminophen 5-325 MG tablet Commonly known as:  NORCO/VICODIN Take 1 tablet by mouth 2 (two) times daily.   insulin aspart 100 UNIT/ML injection Commonly known as:  novoLOG Inject 5-10 Units 2 (two) times daily with a meal into the skin. Take 10 units in the morning and 5 units in the evening with meal   insulin glargine 100 UNIT/ML injection Commonly known as:  LANTUS Inject 30-40 Units 2 (two) times daily into the skin. Inject 30 units in the morning at 40 units at bedtime.   isosorbide mononitrate 30 MG 24 hr tablet Commonly known as:  IMDUR Take 1 tablet (30 mg total) by mouth 2 (two) times daily.   levETIRAcetam 1000 MG tablet Commonly known as:  KEPPRA Take 1 tablet (1,000 mg total) by mouth 2 (two) times daily. What changed:    medication strength  how much to take   meloxicam 7.5 MG tablet Commonly known as:  MOBIC Take 7.5 mg daily by mouth.   metolazone 2.5 MG tablet Commonly known as:  ZAROXOLYN Take 1 tablet (2.5 mg) by mouth once daily as needed for a weigh > 320 lbs, take 30 minutes prior to you first dose of torsemide   montelukast 10 MG tablet Commonly known as:  SINGULAIR Take 1 tablet by mouth daily.   nitroGLYCERIN 0.4 MG SL tablet Commonly known as:  NITROSTAT DISSOLVE ONE TABLET UNDER THE TONGUE EVERY 5 MINUTES AS NEEDED FOR CHEST PAIN.  DO NOT EXCEED A TOTAL OF 3 DOSES IN 15 MINUTES   potassium chloride SA 20 MEQ tablet Commonly known as:  K-DUR,KLOR-CON Take 2 tablets (40 mEq total) by mouth 3 (three) times daily.  Take an extra 2 tablets (40 meq) by mouth on the days you take metolazone   tiotropium 18 MCG inhalation capsule Commonly known as:  SPIRIVA Place 18 mcg into inhaler and inhale daily.   torsemide 20 MG tablet Commonly known as:  DEMADEX Take 3 tablets (60 mg total) by mouth 2 (two) times daily.   traZODone 50 MG tablet Commonly known as:  DESYREL Take 50 mg by mouth at bedtime.   UNIFINE PENTIPS 31G X 6 MM Misc Generic drug:  Insulin Pen Needle       Today  Patient seen and evaluated on the day of discharge  no new episodes of seizures Tolerating diet well Will be discharged to home with home health services Replace potassium orally  VITAL SIGNS:  Blood pressure [name])  148/87, pulse 66, temperature 97.9 F (36.6 C), temperature source Oral, resp. rate 18, height _0  (1.803 m), weight (!) 142.9 kg (315 lb 0.6 oz), SpO2 100 %.  I/O:    Intake/Output Summary (Last 24 hours) at 11/12/2017 1235 Last data filed at 11/12/2017 1009 Gross per 24 hour  Intake 440 ml  Output -  Net 440 ml    PHYSICAL EXAMINATION:  Physical Exam  GENERAL:  72 y.o.-year-old patient lying in the bed with no acute distress.  LUNGS: Normal breath sounds bilaterally, no wheezing, rales,rhonchi or crepitation. No use of accessory muscles of respiration.  CARDIOVASCULAR: S1, S2 normal. No murmurs, rubs, or gallops.  ABDOMEN: Soft, non-tender, non-distended. Bowel sounds present. No organomegaly or mass.  NEUROLOGIC: Moves all 4 extremities. PSYCHIATRIC: The patient is alert and oriented x 3.  SKIN: No obvious rash, lesion, or ulcer.   DATA REVIEW:   CBC Recent Labs  Lab 11/11/17 0501  WBC 6.2  HGB 10.8*  HCT 33.9*  PLT 263    Chemistries  Recent Labs  Lab 11/10/17 1653  11/11/17 1723 11/12/17 0314  NA 139   < >  --  139  K 2.5*   < > 4.7 3.2*  CL 92*   < >  --  96*  CO2 33*   < >  --  30  GLUCOSE 156*   < >  --  217*  BUN 52*   < >  --  49*  CREATININE 1.71*   < >  --  1.30*   CALCIUM 8.9   < >  --  9.1  MG 1.9  --  2.2  --   AST 20  --   --   --   ALT 14  --   --   --   ALKPHOS 156*  --   --   --   BILITOT 0.8  --   --   --    < > = values in this interval not displayed.    Cardiac Enzymes Recent Labs  Lab 11/10/17 1653  TROPONINI <0.03    Microbiology Results  Results for orders placed or performed during the hospital encounter of 08/28/15  Culture, blood (routine x 2)     Status: Abnormal   Collection Time: 08/28/15  8:03 AM  Result Value Ref Range Status   Specimen Description BLOOD RIGHT AC  Final   Special Requests BOTTLES DRAWN AEROBIC AND ANAEROBIC 1ML  Final   Culture  Setup Time   Final    GRAM POSITIVE RODS AEROBIC BOTTLE ONLY CRITICAL RESULT CALLED TO, READ BACK BY AND VERIFIED WITH: JASON ROBBINS at 2120 08/30/15 KLK. Organism ID to follow    Culture (A)  Final    DIPHTHEROIDS(CORYNEBACTERIUM SPECIES) Standardized susceptibility testing for this organism is not available. Results consistent with contamination.    Report Status 09/02/2015 FINAL  Final  Blood Culture ID Panel (Reflexed)     Status: None   Collection Time: 08/28/15  8:03 AM  Result Value Ref Range Status   Enterococcus species NOT DETECTED NOT DETECTED Final   Vancomycin resistance NOT DETECTED NOT DETECTED Final   Listeria monocytogenes NOT DETECTED NOT DETECTED Final   Staphylococcus species NOT DETECTED NOT DETECTED Final   Staphylococcus aureus NOT DETECTED NOT DETECTED Final   Methicillin resistance NOT DETECTED NOT DETECTED Final   Streptococcus species NOT DETECTED NOT DETECTED Final   Streptococcus agalactiae NOT DETECTED NOT DETECTED Final   Streptococcus pneumoniae NOT  DETECTED NOT DETECTED Final   Streptococcus pyogenes NOT DETECTED NOT DETECTED Final   Acinetobacter baumannii NOT DETECTED NOT DETECTED Final   Enterobacteriaceae species NOT DETECTED NOT DETECTED Final   Enterobacter cloacae complex NOT DETECTED NOT DETECTED Final   Escherichia coli  NOT DETECTED NOT DETECTED Final   Klebsiella oxytoca NOT DETECTED NOT DETECTED Final   Klebsiella pneumoniae NOT DETECTED NOT DETECTED Final   Proteus species NOT DETECTED NOT DETECTED Final   Serratia marcescens NOT DETECTED NOT DETECTED Final   Carbapenem resistance NOT DETECTED NOT DETECTED Final   Haemophilus influenzae NOT DETECTED NOT DETECTED Final   Neisseria meningitidis NOT DETECTED NOT DETECTED Final   Pseudomonas aeruginosa NOT DETECTED NOT DETECTED Final   Candida albicans NOT DETECTED NOT DETECTED Final   Candida glabrata NOT DETECTED NOT DETECTED Final   Candida krusei NOT DETECTED NOT DETECTED Final   Candida parapsilosis NOT DETECTED NOT DETECTED Final   Candida tropicalis NOT DETECTED NOT DETECTED Final  Culture, blood (routine x 2)     Status: None   Collection Time: 08/28/15  8:04 AM  Result Value Ref Range Status   Specimen Description BLOOD LEFT AC  Final   Special Requests   Final    BOTTLES DRAWN AEROBIC AND ANAEROBIC AER 1ML ANA .5ML   Culture NO GROWTH 5 DAYS  Final   Report Status 09/02/2015 FINAL  Final    RADIOLOGY:  Ct Head Wo Contrast  Result Date: 11/10/2017 CLINICAL DATA:  72 year old female with seizure 3 days ago. Complaining and nausea weakness. Initial encounter. EXAM: CT HEAD WITHOUT CONTRAST CT CERVICAL SPINE WITHOUT CONTRAST TECHNIQUE: Multidetector CT imaging of the head and cervical spine was performed following the standard protocol without intravenous contrast. Multiplanar CT image reconstructions of the cervical spine were also generated. COMPARISON:  None. FINDINGS: CT HEAD FINDINGS Brain: No intracranial hemorrhage or CT evidence of large acute infarct. Mild chronic microvascular changes. Mild global atrophy. No intracranial mass lesion noted on this unenhanced exam. Vascular: Vascular calcifications Skull: No skull fracture Sinuses/Orbits: No acute orbital abnormality. Visualized paranasal sinuses are clear. Other: Mastoid air cells and  middle ear cavities are clear. CT CERVICAL SPINE FINDINGS Alignment: Straightening of the cervical spine. Minimal increased distance C5-6 interspinous region without other findings to suggest ligamentous injury. Skull base and vertebrae: Evaluation limited by patient's habitus. No obvious fracture. Soft tissues and spinal canal: No abnormal prevertebral soft tissue swelling identified. 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 Upper chest: No upper lung mass identified. Other: Carotid bifurcation calcifications. IMPRESSION: No skull fracture, intracranial hemorrhage or seizure focus identified on this unenhanced exam. Cervical spine CT limited by patient's habitus. No obvious fracture. Minimal widening of the C5-6 interspinous region without other findings to suggest ligamentous injury. 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 If ligamentous injury or cord injury were of high clinical concern and further delineation were clinically desired then MR may be considered. Electronically Signed   By: Genia Del M.D.   On: 11/10/2017 17:28   Ct Cervical Spine Wo Contrast  Result Date: 11/10/2017 CLINICAL DATA:  72 year old female with seizure 3 days ago. Complaining and nausea weakness. Initial encounter. EXAM: CT HEAD WITHOUT CONTRAST CT CERVICAL SPINE WITHOUT CONTRAST TECHNIQUE: Multidetector CT imaging of the head and cervical spine was performed following the standard protocol without intravenous contrast. Multiplanar CT image reconstructions of the cervical spine were also generated. COMPARISON:  None. FINDINGS: CT HEAD  FINDINGS Brain: No intracranial hemorrhage or CT evidence of large acute infarct. Mild chronic microvascular changes. Mild global atrophy. No intracranial mass lesion noted on this unenhanced exam. Vascular: Vascular calcifications Skull: No skull fracture Sinuses/Orbits: No  acute orbital abnormality. Visualized paranasal sinuses are clear. Other: Mastoid air cells and middle ear cavities are clear. CT CERVICAL SPINE FINDINGS Alignment: Straightening of the cervical spine. Minimal increased distance C5-6 interspinous region without other findings to suggest ligamentous injury. Skull base and vertebrae: Evaluation limited by patient's habitus. No obvious fracture. Soft tissues and spinal canal: No abnormal prevertebral soft tissue swelling identified. 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 Upper chest: No upper lung mass identified. Other: Carotid bifurcation calcifications. IMPRESSION: No skull fracture, intracranial hemorrhage or seizure focus identified on this unenhanced exam. Cervical spine CT limited by patient's habitus. No obvious fracture. Minimal widening of the C5-6 interspinous region without other findings to suggest ligamentous injury. 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 If ligamentous injury or cord injury were of high clinical concern and further delineation were clinically desired then MR may be considered. Electronically Signed   By: Genia Del M.D.   On: 11/10/2017 17:28   Mr Jeri Cos HW Contrast  Result Date: 11/12/2017 CLINICAL DATA:  72 y/o F; 3 episodes of seizure. History of seizures. EXAM: MRI HEAD WITHOUT AND WITH CONTRAST TECHNIQUE: Multiplanar, multiecho pulse sequences of the brain and surrounding structures were obtained without and with intravenous contrast. CONTRAST:  49m MULTIHANCE GADOBENATE DIMEGLUMINE 529 MG/ML IV SOLN COMPARISON:  11/10/2017 CT head. FINDINGS: Brain: No acute infarction, hemorrhage, hydrocephalus, extra-axial collection or mass lesion. Few nonspecific foci of T2 FLAIR hyperintense signal abnormality in subcortical and periventricular white matter are compatible with mild chronic microvascular ischemic  changes for age. Mild brain volume loss. No disorder cortical formation, heterotopia, or cortical dysplasia identified. Complete corpus callosum, vermis, and morphologically normal pituitary. Hippocampi are symmetric in size and signal. After administration of intravenous contrast there is no abnormal enhancement. Vascular: Normal flow voids. Skull and upper cervical spine: Normal marrow signal. Sinuses/Orbits: Negative. Other: None. IMPRESSION: 1. No acute intracranial abnormality or structural cause of seizure identified. 2. Mild chronic microvascular ischemic changes and volume loss of the brain for age. Electronically Signed   By: LKristine GarbeM.D.   On: 11/12/2017 01:17    Follow up with PCP in 1 week.  Management plans discussed with the patient, family and they are in agreement.  CODE STATUS:     Code Status Orders  (From admission, onward)        Start     Ordered   11/10/17 2011  Full code  Continuous     11/10/17 2011    Code Status History    Date Active Date Inactive Code Status Order ID Comments User Context   08/16/2017 1230 08/17/2017 1631 DNR [id number] SHillary Bow MD Inpatient   08/16/2017 0253 08/16/2017 1230 Full Code [id number] WLance Coon MD Inpatient   01/29/2017 1749 01/30/2017 2040 Full Code [id number] WLoletha Grayer MD ED   06/16/2016 2036 06/18/2016 1749 Full Code [id number] KEpifanio Lesches MD ED   08/28/2015 1034 08/30/2015 2023 Full Code [id number] PDustin Flock MD ED   06/03/2015 2238 06/05/2015 2044 Full Code [id number] RBrand Males MD Inpatient   04/14/2015 1724 04/15/2015 2006 Full Code [id number] PDustin Flock MD Inpatient    Advance Directive Documentation  Most Recent Value  Type of Advance Directive  Healthcare Power of Attorney  Pre-existing out of facility DNR order (yellow form or pink MOST form)  -  "MOST" Form in Place?  -      TOTAL TIME TAKING CARE OF THIS PATIENT ON DAY OF DISCHARGE: more than 34 minutes.    Saundra Shelling M.D on 11/12/2017 at 12:35 PM  Between 7am to 6pm - Pager - [phone]  After 6pm go to [email or web address] - password [email or web address] [place] [facility]  Office  [phone]  CC: Primary care physician; Herminio Commons, MD  Note: This dictation was prepared with Dragon dictation along with smaller phrase technology. Any transcriptional errors that result from this process are unintentional.

## 2017-11-16 ENCOUNTER — Ambulatory Visit: Payer: Medicare HMO | Admitting: Family

## 2017-11-23 ENCOUNTER — Ambulatory Visit
Admission: RE | Admit: 2017-11-23 | Discharge: 2017-11-23 | Disposition: A | Discharge status: Home / Self Care | Payer: Medicare HMO | Source: Ambulatory Visit | Attending: Anesthesiology | Admitting: Anesthesiology

## 2017-11-23 ENCOUNTER — Ambulatory Visit (HOSPITAL_BASED_OUTPATIENT_CLINIC_OR_DEPARTMENT_OTHER): Payer: Medicare HMO | Admitting: Anesthesiology

## 2017-11-23 ENCOUNTER — Encounter: Payer: Self-pay | Admitting: Anesthesiology

## 2017-11-23 ENCOUNTER — Other Ambulatory Visit: Payer: Self-pay | Admitting: Anesthesiology

## 2017-11-23 VITALS — BP 114/80 | HR 72 | Temp 97.8°F | Resp 15 | Ht 71.0 in | Wt 303.0 lb

## 2017-11-23 DIAGNOSIS — M47817 Spondylosis without myelopathy or radiculopathy, lumbosacral region: Secondary | ICD-10-CM | POA: Insufficient documentation

## 2017-11-23 DIAGNOSIS — Z794 Long term (current) use of insulin: Secondary | ICD-10-CM | POA: Diagnosis not present

## 2017-11-23 DIAGNOSIS — M5431 Sciatica, right side: Secondary | ICD-10-CM

## 2017-11-23 DIAGNOSIS — Z7902 Long term (current) use of antithrombotics/antiplatelets: Secondary | ICD-10-CM | POA: Insufficient documentation

## 2017-11-23 DIAGNOSIS — M48062 Spinal stenosis, lumbar region with neurogenic claudication: Secondary | ICD-10-CM | POA: Diagnosis not present

## 2017-11-23 DIAGNOSIS — M5116 Intervertebral disc disorders with radiculopathy, lumbar region: Secondary | ICD-10-CM | POA: Insufficient documentation

## 2017-11-23 DIAGNOSIS — G894 Chronic pain syndrome: Secondary | ICD-10-CM

## 2017-11-23 DIAGNOSIS — R52 Pain, unspecified: Secondary | ICD-10-CM

## 2017-11-23 DIAGNOSIS — Z79899 Other long term (current) drug therapy: Secondary | ICD-10-CM | POA: Insufficient documentation

## 2017-11-23 DIAGNOSIS — M5432 Sciatica, left side: Secondary | ICD-10-CM

## 2017-11-23 DIAGNOSIS — Z79891 Long term (current) use of opiate analgesic: Secondary | ICD-10-CM | POA: Insufficient documentation

## 2017-11-23 DIAGNOSIS — M170 Bilateral primary osteoarthritis of knee: Secondary | ICD-10-CM | POA: Insufficient documentation

## 2017-11-23 DIAGNOSIS — M5136 Other intervertebral disc degeneration, lumbar region: Secondary | ICD-10-CM

## 2017-11-23 DIAGNOSIS — Z791 Long term (current) use of non-steroidal anti-inflammatories (NSAID): Secondary | ICD-10-CM | POA: Insufficient documentation

## 2017-11-23 DIAGNOSIS — M545 Low back pain: Secondary | ICD-10-CM | POA: Diagnosis present

## 2017-11-23 MED ORDER — HYDROCODONE-ACETAMINOPHEN 5-325 MG PO TABS: 1.0000 | ORAL_TABLET | Freq: Two times a day (BID) | ORAL | 0 refills | Status: DC

## 2017-11-23 MED ORDER — TRIAMCINOLONE ACETONIDE 40 MG/ML IJ SUSP
INTRAMUSCULAR | Status: AC
  Filled 2017-11-23: qty 1

## 2017-11-23 MED ORDER — ROPIVACAINE HCL 2 MG/ML IJ SOLN
INTRAMUSCULAR | Status: AC
  Filled 2017-11-23: qty 10

## 2017-11-23 MED ORDER — IOPAMIDOL (ISOVUE-M 200) INJECTION 41%
20.0000 mL | Freq: Once | INTRAMUSCULAR | Status: DC | PRN
  Administered 2017-11-23: 10 mL
  Filled 2017-11-23: qty 20

## 2017-11-23 MED ORDER — SODIUM CHLORIDE 0.9% FLUSH
10.0000 mL | Freq: Once | INTRAVENOUS | Status: AC
  Administered 2017-11-23: 10 mL

## 2017-11-23 MED ORDER — MIDAZOLAM HCL 5 MG/5ML IJ SOLN
INTRAMUSCULAR | Status: AC
  Filled 2017-11-23: qty 5

## 2017-11-23 MED ORDER — TRIAMCINOLONE ACETONIDE 40 MG/ML IJ SUSP
40.0000 mg | Freq: Once | INTRAMUSCULAR | Status: AC
  Administered 2017-11-23: 40 mg

## 2017-11-23 MED ORDER — IOPAMIDOL (ISOVUE-M 200) INJECTION 41%
INTRAMUSCULAR | Status: AC
  Filled 2017-11-23: qty 10

## 2017-11-23 MED ORDER — SODIUM CHLORIDE 0.9 % IJ SOLN
INTRAMUSCULAR | Status: AC
  Filled 2017-11-23: qty 10

## 2017-11-23 MED ORDER — ROPIVACAINE HCL 2 MG/ML IJ SOLN
10.0000 mL | Freq: Once | INTRAMUSCULAR | Status: AC
  Administered 2017-11-23: 10 mL via EPIDURAL

## 2017-11-23 MED ORDER — LACTATED RINGERS IV SOLN
1000.0000 mL | INTRAVENOUS | Status: DC
  Administered 2017-11-23: 1000 mL via INTRAVENOUS

## 2017-11-23 MED ORDER — LIDOCAINE HCL (PF) 1 % IJ SOLN
5.0000 mL | Freq: Once | INTRAMUSCULAR | Status: AC
  Administered 2017-11-23: 5 mL via SUBCUTANEOUS

## 2017-11-23 MED ORDER — LIDOCAINE HCL 2 % IJ SOLN
INTRAMUSCULAR | Status: AC
  Filled 2017-11-23: qty 20

## 2017-11-23 MED ORDER — MIDAZOLAM HCL 5 MG/5ML IJ SOLN
5.0000 mg | Freq: Once | INTRAMUSCULAR | Status: AC
  Administered 2017-11-23: 3 mg via INTRAVENOUS

## 2017-11-23 NOTE — Progress Notes (Signed)
Subjective:  Patient ID: Tina Hayes, female    DOB: 1945/11/04  Age: 72 y.o. MRN: [id number]  CC: Back Pain (lower left is worse )   Procedure: L5-S1 epidural steroid under fluoroscopic guidance with moderate sedation  HPI Tina Hayes presents for reevaluation.  She was last seen a few weeks ago and presents today for her first epidural injection.  She is been off her Plavix for 10 days and reports that her lower back pain and lower extremity pain are unchanged.  She continues to get primarily bilateral lower extremity pain equal in nature with cramping in the calves and occasional numbness and tingling affecting the feet.  She also has intractable low back pain that is been present described as an aching gnawing pain worse when standing for any prolonged period of time.  She is utilizing a wheelchair at present and states that the hydrocodone that she is recently started is helping with her pain.  Based on her narcotic assessment sheet she is deriving some functional improvement with the quality of her pain in the management of her pain compared to baseline.  He has failed conservative therapy with physical therapy and efforts at weight loss have been unsuccessful.  Otherwise she is in her usual state of health this time.  Outpatient Medications Prior to Visit  Medication Sig Dispense Refill  . ACCU-CHEK AVIVA PLUS test strip     . albuterol (PROVENTIL HFA;VENTOLIN HFA) 108 (90 Base) MCG/ACT inhaler Inhale 2 puffs into the lungs every 6 (six) hours as needed for wheezing or shortness of breath. 1 Inhaler 2  . albuterol (PROVENTIL) (2.5 MG/3ML) 0.083% nebulizer solution Take 2.5 mg every 4 (four) hours as needed by nebulization for wheezing or shortness of breath.    [name] atorvastatin (LIPITOR) 40 MG tablet Take 40 mg at bedtime by mouth.     . Blood Glucose Monitoring Suppl (ACCU-CHEK AVIVA PLUS) w/Device KIT     . Calcium Carbonate-Vitamin D3 (CALCIUM 600-D) 600-400 MG-UNIT TABS Take 1  tablet 2 (two) times daily by mouth.    . carvedilol (COREG) 3.125 MG tablet Take 1 tablet (3.125 mg total) by mouth 2 (two) times daily with a meal. 180 tablet 3  . citalopram (CELEXA) 20 MG tablet Take 20 mg by mouth at bedtime.     . clopidogrel (PLAVIX) 75 MG tablet Take 1 tablet (75 mg total) by mouth daily. 30 tablet 0  . esomeprazole (NEXIUM) 40 MG capsule Take 40 mg daily at 12 noon by mouth.    . insulin aspart (NOVOLOG) 100 UNIT/ML injection Inject 5-10 Units 2 (two) times daily with a meal into the skin. Take 10 units in the morning and 5 units in the evening with meal    . insulin glargine (LANTUS) 100 UNIT/ML injection Inject 30-40 Units 2 (two) times daily into the skin. Inject 30 units in the morning at 40 units at bedtime.    . isosorbide mononitrate (IMDUR) 30 MG 24 hr tablet Take 1 tablet (30 mg total) by mouth 2 (two) times daily. 180 tablet 3  . meloxicam (MOBIC) 7.5 MG tablet Take 7.5 mg daily by mouth.    . metolazone (ZAROXOLYN) 2.5 MG tablet Take 1 tablet (2.5 mg) by mouth once daily as needed for a weigh > 320 lbs, take 30 minutes prior to you first dose of torsemide 30 tablet 0  . montelukast (SINGULAIR) 10 MG tablet Take 1 tablet by mouth daily.    . nitroGLYCERIN (NITROSTAT) 0.4  MG SL tablet DISSOLVE ONE TABLET UNDER THE TONGUE EVERY 5 MINUTES AS NEEDED FOR CHEST PAIN.  DO NOT EXCEED A TOTAL OF 3 DOSES IN 15 MINUTES 25 tablet 0  . potassium chloride SA (K-DUR,KLOR-CON) 20 MEQ tablet Take 2 tablets (40 mEq total) by mouth 3 (three) times daily. Take an extra 2 tablets (40 meq) by mouth on the days you take metolazone    . tiotropium (SPIRIVA) 18 MCG inhalation capsule Place 18 mcg into inhaler and inhale daily.    [name] torsemide (DEMADEX) 20 MG tablet Take 3 tablets (60 mg total) by mouth 2 (two) times daily.    . traZODone (DESYREL) 50 MG tablet Take 50 mg by mouth at bedtime.    [name] UNIFINE PENTIPS 31G X 6 MM MISC     . amLODipine (NORVASC) 10 MG tablet Take 10 mg by mouth  daily.    [name] levETIRAcetam (KEPPRA) 1000 MG tablet Take 1 tablet (1,000 mg total) by mouth 2 (two) times daily. (Patient not taking: Reported on 11/23/2017) 60 tablet 0  . HYDROcodone-acetaminophen (NORCO/VICODIN) 5-325 MG tablet Take 1 tablet by mouth 2 (two) times daily. 60 tablet 0   No facility-administered medications prior to visit.     Review of Systems CNS: No confusion or sedation Cardiac: No angina or palpitations GI: No abdominal pain or constipation Constitutional: No nausea vomiting fevers or chills  Objective:  BP 114/80   Pulse 72   Temp 97.8 F (36.6 C)   Resp 15   Ht '5\' 11"'  (1.803 m)   Wt (!) 303 lb (137.4 kg)   SpO2 100%   BMI 42.26 kg/m    BP Readings from Last 3 Encounters:  11/23/17 114/80  11/12/17 (!) 148/87  11/09/17 131/78     Wt Readings from Last 3 Encounters:  11/23/17 (!) 303 lb (137.4 kg)  11/11/17 (!) 315 lb 0.6 oz (142.9 kg)  11/09/17 (!) 311 lb 8 oz (141.3 kg)     Physical Exam Pt is alert and oriented PERRL EOMI HEART IS RRR no murmur or rub LCTA no wheezing or rales MUSCULOSKELETAL reveals some paraspinous muscle tenderness diffusely throughout the lumbar region.  No specific trigger points are noted.  Her muscle tone and bulk to the lower extremities is at baseline and otherwise no changes are noted on lower extremity exam.  She remains with usage of a wheelchair.  Labs  Lab Results  Component Value Date   HGBA1C 7.7 (H) 08/16/2017   HGBA1C 8.5 (H) 01/29/2017   HGBA1C 8.5 (H) 06/17/2016   Lab Results  Component Value Date   LDLCALC 58 01/30/2017   CREATININE 1.30 (H) 11/12/2017    -------------------------------------------------------------------------------------------------------------------- Lab Results  Component Value Date   WBC 6.2 11/11/2017   HGB 10.8 (L) 11/11/2017   HCT 33.9 (L) 11/11/2017   PLT 263 11/11/2017   GLUCOSE 217 (H) 11/12/2017   CHOL 120 01/30/2017   TRIG 73 01/30/2017   HDL 47 01/30/2017    LDLCALC 58 01/30/2017   ALT 14 11/10/2017   AST 20 11/10/2017   NA 139 11/12/2017   K 3.2 (L) 11/12/2017   CL 96 (L) 11/12/2017   CREATININE 1.30 (H) 11/12/2017   BUN 49 (H) 11/12/2017   CO2 30 11/12/2017   INR 0.99 11/13/2016   HGBA1C 7.7 (H) 08/16/2017    --------------------------------------------------------------------------------------------------------------------- Dg C-arm 1-60 Min-no Report  Result Date: 11/23/2017 Fluoroscopy was utilized by the requesting physician.  No radiographic interpretation.     Assessment &  Plan:   Dorraine was seen today for back pain.  Diagnoses and all orders for this visit:  Bilateral sciatica  Facet arthritis of lumbosacral region  Primary osteoarthritis of both knees  DDD (degenerative disc disease), lumbar  Spinal stenosis of lumbar region with neurogenic claudication  Chronic pain syndrome  Other orders -     HYDROcodone-acetaminophen (NORCO/VICODIN) 5-325 MG tablet; Take 1 tablet by mouth 2 (two) times daily. -     triamcinolone acetonide (KENALOG-40) injection 40 mg -     sodium chloride flush (NS) 0.9 % injection 10 mL -     ropivacaine (PF) 2 mg/mL (0.2%) (NAROPIN) injection 10 mL -     midazolam (VERSED) 5 MG/5ML injection 5 mg -     lidocaine (PF) (XYLOCAINE) 1 % injection 5 mL -     lactated ringers infusion 1,000 mL -     iopamidol (ISOVUE-M) 41 % intrathecal injection 20 mL        ----------------------------------------------------------------------------------------------------------------------  Problem List Items Addressed This Visit    None    Visit Diagnoses    Bilateral sciatica    -  Primary   Relevant Medications   midazolam (VERSED) 5 MG/5ML injection 5 mg (Completed)   Facet arthritis of lumbosacral region       Relevant Medications   HYDROcodone-acetaminophen (NORCO/VICODIN) 5-325 MG tablet   triamcinolone acetonide (KENALOG-40) injection 40 mg (Completed)   Primary osteoarthritis of  both knees       Relevant Medications   HYDROcodone-acetaminophen (NORCO/VICODIN) 5-325 MG tablet   triamcinolone acetonide (KENALOG-40) injection 40 mg (Completed)   DDD (degenerative disc disease), lumbar       Relevant Medications   HYDROcodone-acetaminophen (NORCO/VICODIN) 5-325 MG tablet   triamcinolone acetonide (KENALOG-40) injection 40 mg (Completed)   Spinal stenosis of lumbar region with neurogenic claudication       Relevant Medications   HYDROcodone-acetaminophen (NORCO/VICODIN) 5-325 MG tablet   triamcinolone acetonide (KENALOG-40) injection 40 mg (Completed)   Chronic pain syndrome            ----------------------------------------------------------------------------------------------------------------------  1. Bilateral sciatica We will proceed with an epidural steroid injection today.  The risks and benefits of been reviewed once again in full detail and all questions answered.  Her last epidural injection was in January 2019 and she had favorable response from that.  We will have her return to clinic in 1 month for refill on her medications with a likely repeat epidural injection in 2 months.  I furthermore gone over the necessity for stretching exercises with intensified efforts at weight loss.  2. Facet arthritis of lumbosacral region Efforts at core strengthening will need to be made.  3. Primary osteoarthritis of both knees   4. DDD (degenerative disc disease), lumbar   5. Spinal stenosis of lumbar region with neurogenic claudication As above  6. Chronic pain syndrome We will continue with the hydrocodone at 1 tablet twice a day.  Based on review of the narcotic assessment sheet she continues to do well with this.  Reviewed the [facility] practitioner database information is appropriate.  Refills will be given for Vicodin dated July  31    ----------------------------------------------------------------------------------------------------------------------  I am having Velora Heckler. Staley maintain her tiotropium, traZODone, albuterol, citalopram, ACCU-CHEK AVIVA PLUS, ACCU-CHEK AVIVA PLUS, UNIFINE PENTIPS, insulin aspart, carvedilol, isosorbide mononitrate, clopidogrel, atorvastatin, insulin glargine, esomeprazole, Calcium Carbonate-Vitamin D3, albuterol, meloxicam, nitroGLYCERIN, torsemide, metolazone, potassium chloride SA, montelukast, levETIRAcetam, HYDROcodone-acetaminophen, and amLODipine. We administered triamcinolone acetonide, sodium chloride flush, ropivacaine (PF)  2 mg/mL (0.2%), midazolam, lidocaine (PF), lactated ringers, and iopamidol.   Meds ordered this encounter  Medications  . HYDROcodone-acetaminophen (NORCO/VICODIN) 5-325 MG tablet    Sig: Take 1 tablet by mouth 2 (two) times daily.    Dispense:  60 tablet    Refill:  0    Must last 30 days Do not fill until 731  . triamcinolone acetonide (KENALOG-40) injection 40 mg  . sodium chloride flush (NS) 0.9 % injection 10 mL  . ropivacaine (PF) 2 mg/mL (0.2%) (NAROPIN) injection 10 mL  . midazolam (VERSED) 5 MG/5ML injection 5 mg  . lidocaine (PF) (XYLOCAINE) 1 % injection 5 mL  . lactated ringers infusion 1,000 mL  . iopamidol (ISOVUE-M) 41 % intrathecal injection 20 mL   Patient's Medications  New Prescriptions   No medications on file  Previous Medications   ACCU-CHEK AVIVA PLUS TEST STRIP       ALBUTEROL (PROVENTIL HFA;VENTOLIN HFA) 108 (90 BASE) MCG/ACT INHALER    Inhale 2 puffs into the lungs every 6 (six) hours as needed for wheezing or shortness of breath.   ALBUTEROL (PROVENTIL) (2.5 MG/3ML) 0.083% NEBULIZER SOLUTION    Take 2.5 mg every 4 (four) hours as needed by nebulization for wheezing or shortness of breath.   AMLODIPINE (NORVASC) 10 MG TABLET    Take 10 mg by mouth daily.   ATORVASTATIN (LIPITOR) 40 MG TABLET    Take 40 mg at bedtime by  mouth.    BLOOD GLUCOSE MONITORING SUPPL (ACCU-CHEK AVIVA PLUS) W/DEVICE KIT       CALCIUM CARBONATE-VITAMIN D3 (CALCIUM 600-D) 600-400 MG-UNIT TABS    Take 1 tablet 2 (two) times daily by mouth.   CARVEDILOL (COREG) 3.125 MG TABLET    Take 1 tablet (3.125 mg total) by mouth 2 (two) times daily with a meal.   CITALOPRAM (CELEXA) 20 MG TABLET    Take 20 mg by mouth at bedtime.    CLOPIDOGREL (PLAVIX) 75 MG TABLET    Take 1 tablet (75 mg total) by mouth daily.   ESOMEPRAZOLE (NEXIUM) 40 MG CAPSULE    Take 40 mg daily at 12 noon by mouth.   INSULIN ASPART (NOVOLOG) 100 UNIT/ML INJECTION    Inject 5-10 Units 2 (two) times daily with a meal into the skin. Take 10 units in the morning and 5 units in the evening with meal   INSULIN GLARGINE (LANTUS) 100 UNIT/ML INJECTION    Inject 30-40 Units 2 (two) times daily into the skin. Inject 30 units in the morning at 40 units at bedtime.   ISOSORBIDE MONONITRATE (IMDUR) 30 MG 24 HR TABLET    Take 1 tablet (30 mg total) by mouth 2 (two) times daily.   LEVETIRACETAM (KEPPRA) 1000 MG TABLET    Take 1 tablet (1,000 mg total) by mouth 2 (two) times daily.   MELOXICAM (MOBIC) 7.5 MG TABLET    Take 7.5 mg daily by mouth.   METOLAZONE (ZAROXOLYN) 2.5 MG TABLET    Take 1 tablet (2.5 mg) by mouth once daily as needed for a weigh > 320 lbs, take 30 minutes prior to you first dose of torsemide   MONTELUKAST (SINGULAIR) 10 MG TABLET    Take 1 tablet by mouth daily.   NITROGLYCERIN (NITROSTAT) 0.4 MG SL TABLET    DISSOLVE ONE TABLET UNDER THE TONGUE EVERY 5 MINUTES AS NEEDED FOR CHEST PAIN.  DO NOT EXCEED A TOTAL OF 3 DOSES IN 15 MINUTES   POTASSIUM CHLORIDE SA (K-DUR,KLOR-CON) 20  MEQ TABLET    Take 2 tablets (40 mEq total) by mouth 3 (three) times daily. Take an extra 2 tablets (40 meq) by mouth on the days you take metolazone   TIOTROPIUM (SPIRIVA) 18 MCG INHALATION CAPSULE    Place 18 mcg into inhaler and inhale daily.   TORSEMIDE (DEMADEX) 20 MG TABLET    Take 3 tablets  (60 mg total) by mouth 2 (two) times daily.   TRAZODONE (DESYREL) 50 MG TABLET    Take 50 mg by mouth at bedtime.   UNIFINE PENTIPS 31G X 6 MM MISC      Modified Medications   Modified Medication Previous Medication   HYDROCODONE-ACETAMINOPHEN (NORCO/VICODIN) 5-325 MG TABLET HYDROcodone-acetaminophen (NORCO/VICODIN) 5-325 MG tablet      Take 1 tablet by mouth 2 (two) times daily.    Take 1 tablet by mouth 2 (two) times daily.  Discontinued Medications   No medications on file   ----------------------------------------------------------------------------------------------------------------------  Follow-up: Return in about 1 month (around 12/24/2017) for evaluation, med refill.   Procedure: L5-S1 LESI with fluoroscopic guidance and with moderate sedation  NOTE: The risks, benefits, and expectations of the procedure have been discussed and explained to the patient who was understanding and in agreement with suggested treatment plan. No guarantees were made.  DESCRIPTION OF PROCEDURE: Lumbar epidural steroid injection with 3 IV Versed, EKG, blood pressure, pulse, and pulse oximetry monitoring. The procedure was performed with the patient in the prone position under fluoroscopic guidance.  Sterile prep x3 was initiated and I then injected subcutaneous lidocaine to the overlying 5 S1 site after its fluoroscopic identifictation.  Using strict aseptic technique, I then advanced an 18-gauge 15 cm Tuohy epidural needle in the midline using interlaminar approach via loss-of-resistance to saline technique. There was negative aspiration for heme or  CSF.  I then confirmed position with both AP and Lateral fluoroscan.  2 cc of Isovue were injected and a  total of 5 mL of Preservative-Free normal saline mixed with 40 mg of Kenalog and 1cc Ropicaine 0.2 percent were injected incrementally via the  epidurally placed needle. The needle was removed. The patient tolerated the injection well and was convalesced and  discharged to home in stable condition. Should the patient have any post procedure difficulty they have been instructed on how to contact us for assistance.    Molli Barrows, MD

## 2017-11-23 NOTE — Progress Notes (Signed)
Nursing Pain Medication Assessment:  Safety precautions to be maintained throughout the outpatient stay will include: orient to surroundings, keep bed in low position, maintain call bell within reach at all times, provide assistance with transfer out of bed and ambulation.  Medication Inspection Compliance: Pill count conducted under aseptic conditions, in front of the patient. Neither the pills nor the bottle was removed from the patient's sight at any time. Once count was completed pills were immediately returned to the patient in their original bottle.  Medication: Hydrocodone/APAP Pill/Patch Count: 3 of 60 pills remain Pill/Patch Appearance: Markings consistent with prescribed medication Bottle Appearance: Standard pharmacy container. Clearly labeled. Filled Date: 07 / 01 / 2019 Last Medication intake:  Today

## 2017-11-23 NOTE — Progress Notes (Signed)
Safety precautions to be maintained throughout the outpatient stay will include: orient to surroundings, keep bed in low position, maintain call bell within reach at all times, provide assistance with transfer out of bed and ambulation.  

## 2017-11-23 NOTE — Patient Instructions (Signed)
You have been given 1 script for Hydrocodone today.  Restart Plavix tomorrow  Post-procedure Information What to expect: Most procedures involve the use of a local anesthetic (numbing medicine), and a steroid (anti-inflammatory medicine).  The local anesthetics may cause temporary numbness and weakness of the legs or arms, depending on the location of the block. This numbness/weakness may last 4-6 hours, depending on the local anesthetic used. In rare instances, it can last up to 24 hours. While numb, you must be very careful not to injure the extremity.  After any procedure, you could expect the pain to get better within 15-20 minutes. This relief is temporary and may last 4-6 hours. Once the local anesthetics wears off, you could experience discomfort, possibly more than usual, for up to 10 (ten) days. In the case of radiofrequencies, it may last up to 6 weeks. Surgeries may take up to 8 weeks for the healing process. The discomfort is due to the irritation caused by needles going through skin and muscle. To minimize the discomfort, we recommend using ice the first day, and heat from then on. The ice should be applied for 15 minutes on, and 15 minutes off. Keep repeating this cycle until bedtime. Avoid applying the ice directly to the skin, to prevent frostbite. Heat should be used daily, until the pain improves (4-10 days). Be careful not to burn yourself.  Occasionally you may experience muscle spasms or cramps. These occur as a consequence of the irritation caused by the needle sticks to the muscle and the blood that will inevitably be lost into the surrounding muscle tissue. Blood tends to be very irritating to tissues, which tend to react by going into spasm. These spasms may start the same day of your procedure, but they may also take days to develop. This late onset type of spasm or cramp is usually caused by electrolyte imbalances triggered by the steroids, at the level of the kidney. Cramps and  spasms tend to respond well to muscle relaxants, multivitamins (some are triggered by the procedure, but may have their origins in vitamin deficiencies), and "Gatorade", or any sports drinks that can replenish any electrolyte imbalances. (If you are a diabetic, ask your pharmacist to get you a sugar-free brand.) Warm showers or baths may also be helpful. Stretching exercises are highly recommended. General Instructions:  Be alert for signs of possible infection: redness, swelling, heat, red streaks, elevated temperature, and/or fever. These typically appear 4 to 6 days after the procedure. Immediately notify your doctor if you experience unusual bleeding, difficulty breathing, or loss of bowel or bladder control. If you experience increased pain, do not increase your pain medicine intake, unless instructed by your pain physician. Post-Procedure Care:  Be careful in moving about. Muscle spasms in the area of the injection may occur. Applying ice or heat to the area is often helpful. The incidence of spinal headaches after epidural injections ranges between 1.4% and 6%. If you develop a headache that does not seem to respond to conservative therapy, please let your physician know. This can be treated with an epidural blood patch.   Post-procedure numbness or redness is to be expected, however it should average 4 to 6 hours. If numbness and weakness of your extremities begins to develop 4 to 6 hours after your procedure, and is felt to be progressing and worsening, immediately contact your physician.   Diet:  If you experience nausea, do not eat until this sensation goes away. If you had a "Stellate Ganglion  Block" for upper extremity "Reflex Sympathetic Dystrophy", do not eat or drink until your hoarseness goes away. In any case, always start with liquids first and if you tolerate them well, then slowly progress to more solid foods. Activity:  For the first 4 to 6 hours after the procedure, use caution in  moving about as you may experience numbness and/or weakness. Use caution in cooking, using household electrical appliances, and climbing steps. If you need to reach your Doctor call our office: [phone]) [phone] Monday-Thursday 8:00 am - 4:00 PM    Fridays: Closed     In case of an emergency: In case of emergency, call 911 or go to the nearest emergency room and have the physician there call us.  Interpretation of Procedure Every nerve block has two components: a diagnostic component, and a treatment component. Unrealistic expectations are the most common causes of "perceived failure".  In a perfect world, a single nerve block should be able to completely and permanently eliminate the pain. Sadly, the world is not perfect.  Most pain management nerve blocks are performed using local anesthetics and steroids. Steroids are responsible for any long-term benefit that you may experience. Their purpose is to decrease any chronic swelling that may exist in the area. Steroids begin to work immediately after being injected. However, most patients will not experience any benefits until 5 to 10 days after the injection, when the swelling has come down to the point where they can tell a difference. Steroids will only help if there is swelling to be treated. As such, they can assist with the diagnosis. If effective, they suggest an inflammatory component to the pain, and if ineffective, they rule out inflammation as the main cause or component of the problem. If the problem is one of mechanical compression, you will get no benefit from those steroids.   In the case of local anesthetics, they have a crucial role in the diagnosis of your condition. Most will begin to work within15 to 20 minutes after injection. The duration will depend on the type used (short- vs. Long-acting). It is of outmost importance that patients keep tract of their pain, after the procedure. To assist with this matter, a "Post-procedure Pain Diary"  is provided. Make sure to complete it and to bring it back to your follow-up appointment.  As long as the patient keeps accurate, detailed records of their symptoms after every procedure, and returns to have those interpreted, every procedure will provide [place] with invaluable information. Even a block that does not provide the patient with any relief, will always provide [place] with information about the mechanism and the origin of the pain. The only time a nerve block can be considered a waste of time is when patients do not keep track of the results, or do not keep their post-procedure appointment.  Reporting the results back to your physician The Pain Score  Pain is a subjective complaint. It cannot be seen, touched, or measured. We depend entirely on the patient's report of the pain in order to assess your condition and treatment. To evaluate the pain, we use a pain scale, where "0" means "No Pain", and a "10" is "the worst possible pain that you can even imagine" (i.e. something like been eaten alive by a shark or being torn apart by a lion).   You will frequently be asked to rate your pain. Please be as accurate, remember that medical decisions will be based on your responses. Please do not rate your  pain above a 10. Doing so is actually interpreted as "symptom magnification" (exaggeration), as well as lack of understanding with regards to the scale. To put this into perspective, when you tell us that your pain is at a 10 (ten), what you are saying is that there is nothing we can do to make this pain any worse. (Carefully think about that.)

## 2017-11-24 ENCOUNTER — Telehealth: Payer: Self-pay | Admitting: *Deleted

## 2017-11-24 NOTE — Telephone Encounter (Signed)
Spoke with patient re; procedure on yesterday.  No questions or concerns.  

## 2017-11-26 ENCOUNTER — Encounter: Payer: Self-pay | Admitting: Family

## 2017-11-26 ENCOUNTER — Telehealth: Payer: Self-pay | Admitting: Family

## 2017-11-26 ENCOUNTER — Ambulatory Visit: Payer: Medicare HMO | Attending: Family | Admitting: Family

## 2017-11-26 VITALS — BP 137/46 | HR 57 | Resp 18 | Ht 71.0 in | Wt 308.0 lb

## 2017-11-26 DIAGNOSIS — Z87891 Personal history of nicotine dependence: Secondary | ICD-10-CM | POA: Diagnosis not present

## 2017-11-26 DIAGNOSIS — F329 Major depressive disorder, single episode, unspecified: Secondary | ICD-10-CM | POA: Diagnosis not present

## 2017-11-26 DIAGNOSIS — I10 Essential (primary) hypertension: Secondary | ICD-10-CM

## 2017-11-26 DIAGNOSIS — I110 Hypertensive heart disease with heart failure: Secondary | ICD-10-CM | POA: Diagnosis present

## 2017-11-26 DIAGNOSIS — Z886 Allergy status to analgesic agent status: Secondary | ICD-10-CM | POA: Diagnosis not present

## 2017-11-26 DIAGNOSIS — Z794 Long term (current) use of insulin: Secondary | ICD-10-CM | POA: Diagnosis not present

## 2017-11-26 DIAGNOSIS — I5032 Chronic diastolic (congestive) heart failure: Secondary | ICD-10-CM | POA: Diagnosis not present

## 2017-11-26 DIAGNOSIS — Z951 Presence of aortocoronary bypass graft: Secondary | ICD-10-CM | POA: Diagnosis not present

## 2017-11-26 DIAGNOSIS — I25118 Atherosclerotic heart disease of native coronary artery with other forms of angina pectoris: Secondary | ICD-10-CM | POA: Diagnosis not present

## 2017-11-26 DIAGNOSIS — Z79899 Other long term (current) drug therapy: Secondary | ICD-10-CM | POA: Insufficient documentation

## 2017-11-26 DIAGNOSIS — R1310 Dysphagia, unspecified: Secondary | ICD-10-CM | POA: Diagnosis not present

## 2017-11-26 DIAGNOSIS — E119 Type 2 diabetes mellitus without complications: Secondary | ICD-10-CM | POA: Diagnosis not present

## 2017-11-26 LAB — BASIC METABOLIC PANEL
Anion gap: 13 (ref 5–15)
BUN: 99 mg/dL — ABNORMAL HIGH (ref 8–23)
CALCIUM: 9.2 mg/dL (ref 8.9–10.3)
CO2: 33 mmol/L — AB (ref 22–32)
CREATININE: 1.93 mg/dL — AB (ref 0.44–1.00)
Chloride: 87 mmol/L — ABNORMAL LOW (ref 98–111)
GFR calc Af Amer: 29 mL/min — ABNORMAL LOW (ref >60)
GFR calc non Af Amer: 25 mL/min — ABNORMAL LOW (ref >60)
GLUCOSE: 388 mg/dL — AB (ref 70–99)
Potassium: 2.8 mmol/L — ABNORMAL LOW (ref 3.5–5.1)
Sodium: 133 mmol/L — ABNORMAL LOW (ref 135–145)

## 2017-11-26 MED ORDER — POTASSIUM CHLORIDE CRYS ER 20 MEQ PO TBCR: 40.0000 meq | EXTENDED_RELEASE_TABLET | Freq: Two times a day (BID) | ORAL | 3 refills | Status: DC

## 2017-11-26 MED ORDER — POTASSIUM CHLORIDE CRYS ER 20 MEQ PO TBCR: 40.0000 meq | EXTENDED_RELEASE_TABLET | Freq: Three times a day (TID) | ORAL | 3 refills | Status: DC

## 2017-11-26 MED ORDER — TORSEMIDE 20 MG PO TABS: 40.0000 mg | ORAL_TABLET | Freq: Two times a day (BID) | ORAL | 0 refills | Status: DC

## 2017-11-26 NOTE — Telephone Encounter (Signed)
LM on patient's voicemail regarding lab results that were obtained today (11/26/17). Patient's potassium level has declined (2.8) and her renal function has also declined from 2 weeks ago (creatitine 1.93/ GFR 29).  Advised patient to increase her potassium to 59meq TID (was taking 52meq BID) as well as decrease her torsemide to 40mg  BID (was taking 60mg  BID).   Will need to check lab work next week. She's to call back and let us know if she has home health that could drawn the labs or what day she could return to [place] to recheck lab work

## 2017-11-26 NOTE — Progress Notes (Signed)
Patient ID: [name], female    DOB: Jun 20, 1945, 72 y.o.   MRN: [id number]  HPI  Ms Colberg is a 72 y/o female with a history of seizures, HTN, cardiac stents, DM, depression, CAD, COPD, chronic back pain, anxiety, obesity, remote tobacco use and chronic heart failure.   Last echo was done 06/17/16 and showed an EF of 60-65% along with mild MR and moderate TR. Pulmonary systolic pressure was mildly to moderately elevated. Stress test done 06/18/16.  Admitted 11/10/17 due to seizures. Neurology consult obtained. Keppra dose increased. Brain MRI showed no acute abnormality. Head CT also negative. Discharged after 2 days. Admitted 4//20/19 due to COPD exacerbation. Given IV steroids, nebulizers and oxygen. Discharged after 2 days on prednisone taper.    She presents today for her follow-up visit with a chief complaint of minimal fatigue upon moderate exertion. She describes this as chronic in nature having been present for years. She has associated light-headedness, headaches, leg weakness, trouble swallowing, depression and difficulty sleeping along with this. She denies any abdominal distention, palpitations, pedal edema, chest pain, shortness of breath, cough or weight gain. Recently was hospitalized due to seizures. Waiting on physical therapy to begin for her leg weakness.   Past Medical History:  Diagnosis Date  . (HFpEF) heart failure with preserved ejection fraction ([place])    a. 05/2016 Echo: EF 60-65%, mild to mod LVH, Gr1 DD, mild MR, mildly dil LA, mod TR, mildly to mod increased PASP.  [name] Acute diastolic heart failure ([place]) 01/27/2017  . Anxiety   . Chest pain 06/16/2016  . CHF (congestive heart failure) ([place])   . Chronic back pain   . Chronic diastolic congestive heart failure ([place]) 02/13/2017  . COPD (chronic obstructive pulmonary disease) ([place])   . Coronary artery disease    a. s/p remote PCI x 5;  b. 2006 s/p CABG x 3 ([place], [place]); b. 05/2016 MV:  attenuation corrected images w/o ischemia or wma-->Med rx.  . Coronary artery disease of native artery of native heart with stable angina pectoris ([place]) 06/17/2016  . Depression   . Diabetes mellitus without complication ([place])   . Essential hypertension 06/30/2016  . Heart attack ([place])    Total of 3 per pt.  . Hypertension   . Hypertensive urgency 06/03/2015  . Seizures ([place])    Past Surgical History:  Procedure Laterality Date  . ANKLE SURGERY    . COLONOSCOPY WITH PROPOFOL N/A 05/04/2017   Procedure: COLONOSCOPY WITH PROPOFOL;  Surgeon: [name], MD;  Location: [facility] ENDOSCOPY;  Service: Endoscopy;  Laterality: N/A;  . CORONARY ANGIOPLASTY    . CORONARY ARTERY BYPASS GRAFT    . ESOPHAGOGASTRODUODENOSCOPY (EGD) WITH PROPOFOL N/A 05/04/2017   Procedure: ESOPHAGOGASTRODUODENOSCOPY (EGD) WITH PROPOFOL;  Surgeon: [name], MD;  Location: [facility] ENDOSCOPY;  Service: Endoscopy;  Laterality: N/A;  . FRACTURE SURGERY    . KNEE SURGERY    . MOUTH SURGERY Left 07/14/2017   Family History  Problem Relation Age of Onset  . Diabetes Unknown   . Hypertension Unknown   . Diabetes Mother   . Heart failure Mother   . Heart disease Mother   . Heart attack Mother   . Stroke Mother   . Depression Mother   . Hypertension Mother   . Cancer Sister        brain  . Hypertension Sister   . Diabetes Brother   . Hypertension Brother   . Heart failure Sister   .  Heart attack Sister   . SIDS Sister    Social History   Tobacco Use  . Smoking status: Former Smoker    Packs/day: 0.25    Years: 20.00    Pack years: 5.00    Types: Cigarettes    Last attempt to quit: 07/01/2006    Years since quitting: 11.4  . Smokeless tobacco: Never Used  Substance Use Topics  . Alcohol use: No    Alcohol/week: 0.0 oz   Allergies  Allergen Reactions  . Aspirin Anaphylaxis   Prior to Admission medications   Medication Sig Start Date End Date Taking? Authorizing Provider  ACCU-CHEK AVIVA PLUS test  strip  05/19/16  Yes [provider]  albuterol (PROVENTIL HFA;VENTOLIN HFA) 108 (90 Base) MCG/ACT inhaler Inhale 2 puffs into the lungs every 6 (six) hours as needed for wheezing or shortness of breath. 08/30/15  Yes Epifanio Lesches, MD  albuterol (PROVENTIL) (2.5 MG/3ML) 0.083% nebulizer solution Take 2.5 mg every 4 (four) hours as needed by nebulization for wheezing or shortness of breath.   Yes [provider]  amLODipine (NORVASC) 10 MG tablet Take 10 mg by mouth daily. 11/12/17  Yes [provider]  atorvastatin (LIPITOR) 40 MG tablet Take 40 mg at bedtime by mouth.    Yes [provider]  Blood Glucose Monitoring Suppl (ACCU-CHEK AVIVA PLUS) w/Device KIT  06/13/16  Yes [provider]  Calcium Carbonate-Vitamin D3 (CALCIUM 600-D) 600-400 MG-UNIT TABS Take 1 tablet 2 (two) times daily by mouth.   Yes [provider]  carvedilol (COREG) 3.125 MG tablet Take 1 tablet (3.125 mg total) by mouth 2 (two) times daily with a meal. 08/12/16  Yes Hackney, Tina A, FNP  citalopram (CELEXA) 20 MG tablet Take 20 mg by mouth at bedtime.    Yes [provider]  clopidogrel (PLAVIX) 75 MG tablet Take 1 tablet (75 mg total) by mouth daily. 08/14/16  Yes Minna Merritts, MD  esomeprazole (NEXIUM) 40 MG capsule Take 40 mg daily at 12 noon by mouth.   Yes [provider]  HYDROcodone-acetaminophen (NORCO/VICODIN) 5-325 MG tablet Take 1 tablet by mouth 2 (two) times daily. 11/23/17  Yes Molli Barrows, MD  insulin aspart (NOVOLOG) 100 UNIT/ML injection Inject 5-10 Units 2 (two) times daily with a meal into the skin. Take 10 units in the morning and 5 units in the evening with meal   Yes [provider]  insulin glargine (LANTUS) 100 UNIT/ML injection Inject 30-40 Units 2 (two) times daily into the skin. Inject 30 units in the morning at 40 units at bedtime.   Yes [provider]  isosorbide mononitrate (IMDUR) 30 MG 24 hr tablet  Take 1 tablet (30 mg total) by mouth 2 (two) times daily. 08/12/16  Yes Darylene Price A, FNP  levETIRAcetam (KEPPRA) 1000 MG tablet Take 1 tablet (1,000 mg total) by mouth 2 (two) times daily. 11/12/17 12/12/17 Yes Pyreddy, Reatha Harps, MD  meloxicam (MOBIC) 7.5 MG tablet Take 7.5 mg daily by mouth.   Yes [provider]  metolazone (ZAROXOLYN) 2.5 MG tablet Take 1 tablet (2.5 mg) by mouth once daily as needed for a weigh > 320 lbs, take 30 minutes prior to you first dose of torsemide 11/06/17  Yes Gollan, Kathlene November, MD  montelukast (SINGULAIR) 10 MG tablet Take 1 tablet by mouth daily. 11/05/17  Yes [provider]  nitroGLYCERIN (NITROSTAT) 0.4 MG SL tablet DISSOLVE ONE TABLET UNDER THE TONGUE EVERY 5 MINUTES AS NEEDED FOR  CHEST PAIN.  DO NOT EXCEED A TOTAL OF 3 DOSES IN 15 MINUTES 07/30/17  Yes Gollan, Kathlene November, MD  tiotropium (SPIRIVA) 18 MCG inhalation capsule Place 18 mcg into inhaler and inhale daily.   Yes [provider]  traZODone (DESYREL) 50 MG tablet Take 50 mg by mouth at bedtime.   Yes [provider]  UNIFINE PENTIPS 31G X 6 MM [place]  07/23/16  Yes [provider]  potassium chloride SA (K-DUR,KLOR-CON) 20 MEQ tablet Take 2 tablets (40 mEq total) by mouth 2 (two) times daily. Take extra 2 tablets (40 meq) if you take metolazone 11/26/17   Darylene Price A, FNP  torsemide (DEMADEX) 20 MG tablet Take 3 tablets (60 mg total) by mouth 2 (two) times daily. 11/26/17 12/26/17  Alisa Graff, FNP    Review of Systems  Constitutional: Positive for fatigue. Negative for appetite change.  HENT: Positive for trouble swallowing ("chronic"). Negative for congestion, postnasal drip and sore throat.   Eyes: Negative.   Respiratory: Negative for cough, chest tightness and shortness of breath.   Cardiovascular: Negative for chest pain, palpitations and leg swelling.  Gastrointestinal: Negative for abdominal distention, abdominal pain and nausea.  Endocrine: Negative.    Genitourinary: Negative.   Musculoskeletal: Positive for arthralgias (left shoulder pain) and back pain (chronic). Negative for neck pain.  Skin: Negative.   Allergic/Immunologic: Negative.   Neurological: Positive for weakness (legs), light-headedness and headaches. Negative for dizziness.  Hematological: Negative for adenopathy. Does not bruise/bleed easily.  Psychiatric/Behavioral: Positive for dysphoric mood and sleep disturbance. Negative for suicidal ideas. The patient is not nervous/anxious.    Vitals:   11/26/17 0858  BP: (!) 137/46  Pulse: (!) 57  Resp: 18  SpO2: 100%  Weight: (!) 308 lb (139.7 kg)  Height: '5\' 11"'  (1.803 m)   Wt Readings from Last 3 Encounters:  11/26/17 (!) 308 lb (139.7 kg)  11/23/17 (!) 303 lb (137.4 kg)  11/11/17 (!) 315 lb 0.6 oz (142.9 kg)   Lab Results  Component Value Date   CREATININE 1.30 (H) 11/12/2017   CREATININE 1.49 (H) 11/11/2017   CREATININE 1.71 (H) 11/10/2017   Physical Exam  Constitutional: She is oriented to person, place, and time. She appears well-developed and well-nourished.  HENT:  Head: Normocephalic and atraumatic.  Neck: Normal range of motion. Neck supple. No JVD present.  Cardiovascular: Regular rhythm. Bradycardia present.  Pulmonary/Chest: Effort normal. She has no wheezes. She has no rales.  Abdominal: Soft. She exhibits no distension. There is no tenderness.  Musculoskeletal: She exhibits no edema or tenderness.  Neurological: She is alert and oriented to person, place, and time.  Skin: Skin is warm and dry.  Psychiatric: She has a normal mood and affect. Her behavior is normal. Thought content normal.  Nursing note and vitals reviewed.  Assessment & Plan:  1: Chronic heart failure with preserved ejection fraction- - NYHA class II - euvolemic today - weighing daily and says that her home weight has gradually declined.  Reminded to call for an overnight weight gain of >2 pounds or a weekly weight gain of >5  pounds.  - weight down 8 pounds since she was last here 3 months ago - has not been adding salt to her foods and is trying to eat low sodium foods - trying to keep fluid intake closer to 60 ounces daily but admits that it's difficult to do - get BMP today since potassium was low at hospital discharge on 11/12/17 -  saw her cardiologist Rockey Situ) 11/09/17 - BNP 03/16/17 was 166.0  2: HTN- - BP looks good today - BMP from 11/12/17 reviewed; sodium 139, potassium 3.2 and GFR 47 - sees PCP at the [facility]  3: Diabetes-  - fasting glucose at home yesterday was good - A1c on 08/16/17 was 7.7%  4: Depression- - says that she feels like her depression has worsened some because of her recent seizures and resultant memory loss  5: Dysphagia- - patient says that she continues to have difficulty swallowing  - saw GI Wynetta Emery) 10/01/17 - had endoscopy done January 2019  Patient did not bring her medications nor a list. Each medication was verbally reviewed with the patient and she was encouraged to bring the bottles to every visit to confirm accuracy of list.  Return in 2 months or sooner for any questions/problems before then.

## 2017-11-26 NOTE — Patient Instructions (Signed)
Continue weighing daily and call for an overnight weight gain of > 2 pounds or a weekly weight gain of >5 pounds. 

## 2017-11-30 ENCOUNTER — Other Ambulatory Visit: Payer: Self-pay | Admitting: Family

## 2017-12-03 ENCOUNTER — Encounter
Admission: RE | Admit: 2017-12-03 | Discharge: 2017-12-03 | Disposition: A | Discharge status: Home / Self Care | Payer: Medicare HMO | Source: Ambulatory Visit | Attending: Family | Admitting: Family

## 2017-12-03 DIAGNOSIS — E876 Hypokalemia: Secondary | ICD-10-CM | POA: Diagnosis not present

## 2017-12-03 LAB — BASIC METABOLIC PANEL
Anion gap: 10 (ref 5–15)
BUN: 56 mg/dL — ABNORMAL HIGH (ref 8–23)
CO2: 35 mmol/L — ABNORMAL HIGH (ref 22–32)
Calcium: 9.5 mg/dL (ref 8.9–10.3)
Chloride: 95 mmol/L — ABNORMAL LOW (ref 98–111)
Creatinine, Ser: 1.34 mg/dL — ABNORMAL HIGH (ref 0.44–1.00)
GFR calc Af Amer: 45 mL/min — ABNORMAL LOW (ref >60)
GFR calc non Af Amer: 39 mL/min — ABNORMAL LOW (ref >60)
Glucose, Bld: 229 mg/dL — ABNORMAL HIGH (ref 70–99)
Potassium: 3.7 mmol/L (ref 3.5–5.1)
Sodium: 140 mmol/L (ref 135–145)

## 2017-12-07 ENCOUNTER — Telehealth: Payer: Self-pay | Admitting: General Surgery

## 2017-12-07 NOTE — Telephone Encounter (Signed)
I spoke with Candie Chroman RN at the [facility] and she originally call to check on a ref for this patient. We had not received fax. She faxed over and Dr Burt Knack was the on call doctor for 12-07-17 so I called to [facility] and spoke with Georgina Peer. I explained the patient needed an appointment for medical Malleolus cyst. She sent a message to [facility] with the diagnosis above. Herb Grays states the patient would need to be seen by Orthopedics. I left a message on [place] phone & she called back we discussed further.  She will relay this message to the referring provider Alwyn Pea NP.

## 2017-12-23 ENCOUNTER — Other Ambulatory Visit: Payer: Self-pay

## 2017-12-23 ENCOUNTER — Ambulatory Visit: Payer: Medicare HMO | Attending: Anesthesiology | Admitting: Anesthesiology

## 2017-12-23 ENCOUNTER — Encounter: Payer: Self-pay | Admitting: Anesthesiology

## 2017-12-23 VITALS — BP 107/85 | HR 53 | Temp 98.1°F | Resp 16 | Ht 71.0 in | Wt 298.0 lb

## 2017-12-23 DIAGNOSIS — M5136 Other intervertebral disc degeneration, lumbar region: Secondary | ICD-10-CM | POA: Insufficient documentation

## 2017-12-23 DIAGNOSIS — M25512 Pain in left shoulder: Secondary | ICD-10-CM | POA: Diagnosis not present

## 2017-12-23 DIAGNOSIS — M5432 Sciatica, left side: Secondary | ICD-10-CM | POA: Insufficient documentation

## 2017-12-23 DIAGNOSIS — G894 Chronic pain syndrome: Secondary | ICD-10-CM | POA: Insufficient documentation

## 2017-12-23 DIAGNOSIS — M47817 Spondylosis without myelopathy or radiculopathy, lumbosacral region: Secondary | ICD-10-CM | POA: Diagnosis not present

## 2017-12-23 DIAGNOSIS — M5431 Sciatica, right side: Secondary | ICD-10-CM | POA: Insufficient documentation

## 2017-12-23 DIAGNOSIS — M48062 Spinal stenosis, lumbar region with neurogenic claudication: Secondary | ICD-10-CM | POA: Insufficient documentation

## 2017-12-23 DIAGNOSIS — M170 Bilateral primary osteoarthritis of knee: Secondary | ICD-10-CM | POA: Insufficient documentation

## 2017-12-23 DIAGNOSIS — Z79891 Long term (current) use of opiate analgesic: Secondary | ICD-10-CM | POA: Diagnosis not present

## 2017-12-23 DIAGNOSIS — Z79899 Other long term (current) drug therapy: Secondary | ICD-10-CM | POA: Diagnosis not present

## 2017-12-23 DIAGNOSIS — F1190 Opioid use, unspecified, uncomplicated: Secondary | ICD-10-CM

## 2017-12-23 DIAGNOSIS — Z794 Long term (current) use of insulin: Secondary | ICD-10-CM | POA: Insufficient documentation

## 2017-12-23 MED ORDER — HYDROCODONE-ACETAMINOPHEN 5-325 MG PO TABS: 1.0000 | ORAL_TABLET | Freq: Two times a day (BID) | ORAL | 0 refills | Status: DC

## 2017-12-23 NOTE — Progress Notes (Signed)
Safety precautions to be maintained throughout the outpatient stay will include: orient to surroundings, keep bed in low position, maintain call bell within reach at all times, provide assistance with transfer out of bed and ambulation.  

## 2017-12-23 NOTE — Patient Instructions (Signed)
Hydrocodone prescription given x2

## 2017-12-23 NOTE — Progress Notes (Signed)
Nursing Pain Medication Assessment:  Safety precautions to be maintained throughout the outpatient stay will include: orient to surroundings, keep bed in low position, maintain call bell within reach at all times, provide assistance with transfer out of bed and ambulation.  Medication Inspection Compliance: Pill count conducted under aseptic conditions, in front of the patient. Neither the pills nor the bottle was removed from the patient's sight at any time. Once count was completed pills were immediately returned to the patient in their original bottle.  Medication: See above Pill/Patch Count: 6 of 60 pills remain Pill/Patch Appearance: Markings consistent with prescribed medication Bottle Appearance: Standard pharmacy container. Clearly labeled. Filled Date: 8 / 2 / 2019 Last Medication intake:  Today

## 2017-12-24 NOTE — Progress Notes (Signed)
Subjective:  Patient ID: Tina Hayes, female    DOB: April 23, 1946  Age: 72 y.o. MRN: [id number]  CC: Back Pain (lower)   Procedure: None  HPI Tina Hayes presents for evaluation.  She was last seen in July and had an epidural steroid injection at that time.  She has had some improvement in her calf and lower extremity pain however continues to have low back pain of the same quality characteristic and distribution.  No significant changes are reported in her low back pain but her leg pain is better.  No changes in her lower extremity strength or function or bowel bladder function are noted.  Otherwise she is in her usual state of health.  Based on her narcotic assessment sheet she continues to derive good functional lifestyle improvement with her medications and no side effects.  She is taking her medications as prescribed with no diverting or illicit use.  She is using these twice a day Vicodin 5 mg strength.  Outpatient Medications Prior to Visit  Medication Sig Dispense Refill  . ACCU-CHEK AVIVA PLUS test strip     . albuterol (PROVENTIL HFA;VENTOLIN HFA) 108 (90 Base) MCG/ACT inhaler Inhale 2 puffs into the lungs every 6 (six) hours as needed for wheezing or shortness of breath. 1 Inhaler 2  . albuterol (PROVENTIL) (2.5 MG/3ML) 0.083% nebulizer solution Take 2.5 mg every 4 (four) hours as needed by nebulization for wheezing or shortness of breath.    [name] amLODipine (NORVASC) 10 MG tablet Take 10 mg by mouth daily.    [name] atorvastatin (LIPITOR) 40 MG tablet Take 40 mg at bedtime by mouth.     . Blood Glucose Monitoring Suppl (ACCU-CHEK AVIVA PLUS) w/Device KIT     . Calcium Carbonate-Vitamin D3 (CALCIUM 600-D) 600-400 MG-UNIT TABS Take 1 tablet 2 (two) times daily by mouth.    . citalopram (CELEXA) 20 MG tablet Take 20 mg by mouth at bedtime.     . clopidogrel (PLAVIX) 75 MG tablet Take 1 tablet (75 mg total) by mouth daily. 30 tablet 0  . esomeprazole (NEXIUM) 40 MG capsule Take 40 mg daily  at 12 noon by mouth.    . insulin aspart (NOVOLOG) 100 UNIT/ML injection Inject 5-10 Units 2 (two) times daily with a meal into the skin. Take 10 units in the morning and 5 units in the evening with meal    . insulin glargine (LANTUS) 100 UNIT/ML injection Inject 30-40 Units 2 (two) times daily into the skin. Inject 30 units in the morning at 40 units at bedtime.    . isosorbide mononitrate (IMDUR) 30 MG 24 hr tablet Take 1 tablet (30 mg total) by mouth 2 (two) times daily. 180 tablet 3  . meloxicam (MOBIC) 7.5 MG tablet Take 7.5 mg daily by mouth.    . metolazone (ZAROXOLYN) 2.5 MG tablet Take 1 tablet (2.5 mg) by mouth once daily as needed for a weigh > 320 lbs, take 30 minutes prior to you first dose of torsemide 30 tablet 0  . montelukast (SINGULAIR) 10 MG tablet Take 1 tablet by mouth daily.    . nitroGLYCERIN (NITROSTAT) 0.4 MG SL tablet DISSOLVE ONE TABLET UNDER THE TONGUE EVERY 5 MINUTES AS NEEDED FOR CHEST PAIN.  DO NOT EXCEED A TOTAL OF 3 DOSES IN 15 MINUTES 25 tablet 0  . potassium chloride SA (K-DUR,KLOR-CON) 20 MEQ tablet Take 2 tablets (40 mEq total) by mouth 3 (three) times daily. Take extra 2 tablets (40 meq) if you  take metolazone 120 tablet 3  . tiotropium (SPIRIVA) 18 MCG inhalation capsule Place 18 mcg into inhaler and inhale daily.    [name] torsemide (DEMADEX) 20 MG tablet Take 2 tablets (40 mg total) by mouth 2 (two) times daily. 180 tablet 0  . traZODone (DESYREL) 50 MG tablet Take 50 mg by mouth at bedtime.    [name] UNIFINE PENTIPS 31G X 6 MM MISC     . HYDROcodone-acetaminophen (NORCO/VICODIN) 5-325 MG tablet Take 1 tablet by mouth 2 (two) times daily. 60 tablet 0  . carvedilol (COREG) 3.125 MG tablet Take 1 tablet (3.125 mg total) by mouth 2 (two) times daily with a meal. 180 tablet 3  . levETIRAcetam (KEPPRA) 1000 MG tablet Take 1 tablet (1,000 mg total) by mouth 2 (two) times daily. 60 tablet 0   No facility-administered medications prior to visit.     Review of Systems CNS:  No confusion or sedation Cardiac: No angina or palpitations GI: No abdominal pain or constipation Constitutional: No nausea vomiting fevers or chills  Objective:  BP 107/85   Pulse (!) 53   Temp 98.1 F (36.7 C)   Resp 16   Ht '5\' 11"'  (1.803 m)   Wt 298 lb (135.2 kg)   SpO2 96%   BMI 41.56 kg/m    BP Readings from Last 3 Encounters:  12/23/17 107/85  11/26/17 (!) 137/46  11/23/17 114/80     Wt Readings from Last 3 Encounters:  12/23/17 298 lb (135.2 kg)  11/26/17 (!) 308 lb (139.7 kg)  11/23/17 (!) 303 lb (137.4 kg)     Physical Exam Pt is alert and oriented PERRL EOMI HEART IS RRR no murmur or rub LCTA no wheezing or rales MUSCULOSKELETAL reveals some paraspinous muscle tenderness but no overt trigger points.  Her muscle tone and bulk to the lower extremities is at baseline and she is using ambulatory assistance.  Labs  Lab Results  Component Value Date   HGBA1C 7.7 (H) 08/16/2017   HGBA1C 8.5 (H) 01/29/2017   HGBA1C 8.5 (H) 06/17/2016   Lab Results  Component Value Date   LDLCALC 58 01/30/2017   CREATININE 1.34 (H) 12/03/2017    -------------------------------------------------------------------------------------------------------------------- Lab Results  Component Value Date   WBC 6.2 11/11/2017   HGB 10.8 (L) 11/11/2017   HCT 33.9 (L) 11/11/2017   PLT 263 11/11/2017   GLUCOSE 229 (H) 12/03/2017   CHOL 120 01/30/2017   TRIG 73 01/30/2017   HDL 47 01/30/2017   LDLCALC 58 01/30/2017   ALT 14 11/10/2017   AST 20 11/10/2017   NA 140 12/03/2017   K 3.7 12/03/2017   CL 95 (L) 12/03/2017   CREATININE 1.34 (H) 12/03/2017   BUN 56 (H) 12/03/2017   CO2 35 (H) 12/03/2017   INR 0.99 11/13/2016   HGBA1C 7.7 (H) 08/16/2017    --------------------------------------------------------------------------------------------------------------------- No results found.   Assessment & Plan:   Tina Hayes was seen today for back pain.  Diagnoses and all orders  for this visit:  Bilateral sciatica  Facet arthritis of lumbosacral region  Primary osteoarthritis of both knees  DDD (degenerative disc disease), lumbar  Spinal stenosis of lumbar region with neurogenic claudication  Chronic pain syndrome  Chronic, continuous use of opioids  Pain in joint of left shoulder  Other orders -     Discontinue: HYDROcodone-acetaminophen (NORCO/VICODIN) 5-325 MG tablet; Take 1 tablet by mouth 2 (two) times daily. -     HYDROcodone-acetaminophen (NORCO/VICODIN) 5-325 MG tablet; Take 1 tablet by mouth  2 (two) times daily.        ----------------------------------------------------------------------------------------------------------------------  Problem List Items Addressed This Visit    None    Visit Diagnoses    Bilateral sciatica    -  Primary   Facet arthritis of lumbosacral region       Relevant Medications   HYDROcodone-acetaminophen (NORCO/VICODIN) 5-325 MG tablet   Primary osteoarthritis of both knees       Relevant Medications   HYDROcodone-acetaminophen (NORCO/VICODIN) 5-325 MG tablet   DDD (degenerative disc disease), lumbar       Relevant Medications   HYDROcodone-acetaminophen (NORCO/VICODIN) 5-325 MG tablet   Spinal stenosis of lumbar region with neurogenic claudication       Relevant Medications   HYDROcodone-acetaminophen (NORCO/VICODIN) 5-325 MG tablet   Chronic pain syndrome       Chronic, continuous use of opioids       Pain in joint of left shoulder            ----------------------------------------------------------------------------------------------------------------------  1. Bilateral sciatica This appears to be improved from her baseline and following her epidural steroid administration.  I want her to continue efforts at strength training and core strengthening.  We have gone over the imperative nature of weight loss and previous discussions and this was reiterated today.  2. Facet arthritis of lumbosacral  region As above  3. Primary osteoarthritis of both knees As above  4. DDD (degenerative disc disease), lumbar As above  5. Spinal stenosis of lumbar region with neurogenic claudication   6. Chronic pain syndrome We will refill her medications for August 30 and September 29 on her Vicodin.  We have reviewed the [facility] practitioner database information and it is appropriate.  She is scheduled for return to clinic in 2 months.  7. Chronic, continuous use of opioids As above  8. Pain in joint of left shoulder     ----------------------------------------------------------------------------------------------------------------------  I am having Tina Hayes. Tina Hayes maintain her tiotropium, traZODone, albuterol, citalopram, ACCU-CHEK AVIVA PLUS, ACCU-CHEK AVIVA PLUS, UNIFINE PENTIPS, insulin aspart, carvedilol, isosorbide mononitrate, clopidogrel, atorvastatin, insulin glargine, esomeprazole, Calcium Carbonate-Vitamin D3, albuterol, meloxicam, nitroGLYCERIN, metolazone, montelukast, levETIRAcetam, amLODipine, potassium chloride SA, torsemide, and HYDROcodone-acetaminophen.   Meds ordered this encounter  Medications  . DISCONTD: HYDROcodone-acetaminophen (NORCO/VICODIN) 5-325 MG tablet    Sig: Take 1 tablet by mouth 2 (two) times daily.    Dispense:  60 tablet    Refill:  0    Must last 30 days Do not fill until [id number]  . HYDROcodone-acetaminophen (NORCO/VICODIN) 5-325 MG tablet    Sig: Take 1 tablet by mouth 2 (two) times daily.    Dispense:  60 tablet    Refill:  0    Must last 30 days Do not fill until [id number]   Patient's Medications  New Prescriptions   No medications on file  Previous Medications   ACCU-CHEK AVIVA PLUS TEST STRIP       ALBUTEROL (PROVENTIL HFA;VENTOLIN HFA) 108 (90 BASE) MCG/ACT INHALER    Inhale 2 puffs into the lungs every 6 (six) hours as needed for wheezing or shortness of breath.   ALBUTEROL (PROVENTIL) (2.5 MG/3ML) 0.083% NEBULIZER SOLUTION     Take 2.5 mg every 4 (four) hours as needed by nebulization for wheezing or shortness of breath.   AMLODIPINE (NORVASC) 10 MG TABLET    Take 10 mg by mouth daily.   ATORVASTATIN (LIPITOR) 40 MG TABLET    Take 40 mg at bedtime by mouth.    BLOOD GLUCOSE MONITORING SUPPL (  ACCU-CHEK AVIVA PLUS) W/DEVICE KIT       CALCIUM CARBONATE-VITAMIN D3 (CALCIUM 600-D) 600-400 MG-UNIT TABS    Take 1 tablet 2 (two) times daily by mouth.   CARVEDILOL (COREG) 3.125 MG TABLET    Take 1 tablet (3.125 mg total) by mouth 2 (two) times daily with a meal.   CITALOPRAM (CELEXA) 20 MG TABLET    Take 20 mg by mouth at bedtime.    CLOPIDOGREL (PLAVIX) 75 MG TABLET    Take 1 tablet (75 mg total) by mouth daily.   ESOMEPRAZOLE (NEXIUM) 40 MG CAPSULE    Take 40 mg daily at 12 noon by mouth.   INSULIN ASPART (NOVOLOG) 100 UNIT/ML INJECTION    Inject 5-10 Units 2 (two) times daily with a meal into the skin. Take 10 units in the morning and 5 units in the evening with meal   INSULIN GLARGINE (LANTUS) 100 UNIT/ML INJECTION    Inject 30-40 Units 2 (two) times daily into the skin. Inject 30 units in the morning at 40 units at bedtime.   ISOSORBIDE MONONITRATE (IMDUR) 30 MG 24 HR TABLET    Take 1 tablet (30 mg total) by mouth 2 (two) times daily.   LEVETIRACETAM (KEPPRA) 1000 MG TABLET    Take 1 tablet (1,000 mg total) by mouth 2 (two) times daily.   MELOXICAM (MOBIC) 7.5 MG TABLET    Take 7.5 mg daily by mouth.   METOLAZONE (ZAROXOLYN) 2.5 MG TABLET    Take 1 tablet (2.5 mg) by mouth once daily as needed for a weigh > 320 lbs, take 30 minutes prior to you first dose of torsemide   MONTELUKAST (SINGULAIR) 10 MG TABLET    Take 1 tablet by mouth daily.   NITROGLYCERIN (NITROSTAT) 0.4 MG SL TABLET    DISSOLVE ONE TABLET UNDER THE TONGUE EVERY 5 MINUTES AS NEEDED FOR CHEST PAIN.  DO NOT EXCEED A TOTAL OF 3 DOSES IN 15 MINUTES   POTASSIUM CHLORIDE SA (K-DUR,KLOR-CON) 20 MEQ TABLET    Take 2 tablets (40 mEq total) by mouth 3 (three) times  daily. Take extra 2 tablets (40 meq) if you take metolazone   TIOTROPIUM (SPIRIVA) 18 MCG INHALATION CAPSULE    Place 18 mcg into inhaler and inhale daily.   TORSEMIDE (DEMADEX) 20 MG TABLET    Take 2 tablets (40 mg total) by mouth 2 (two) times daily.   TRAZODONE (DESYREL) 50 MG TABLET    Take 50 mg by mouth at bedtime.   UNIFINE PENTIPS 31G X 6 MM MISC      Modified Medications   Modified Medication Previous Medication   HYDROCODONE-ACETAMINOPHEN (NORCO/VICODIN) 5-325 MG TABLET HYDROcodone-acetaminophen (NORCO/VICODIN) 5-325 MG tablet      Take 1 tablet by mouth 2 (two) times daily.    Take 1 tablet by mouth 2 (two) times daily.  Discontinued Medications   No medications on file   ----------------------------------------------------------------------------------------------------------------------  Follow-up: Return in about 2 months (around 02/22/2018) for evaluation, med refill.    Molli Barrows, MD

## 2018-01-26 NOTE — Progress Notes (Signed)
Patient ID: Tina Hayes, female    DOB: May 08, 1945, 72 y.o.   MRN: [id number]  HPI  Ms Plemmons is a 72 y/o female with a history of seizures, HTN, cardiac stents, DM, depression, CAD, COPD, chronic back pain, anxiety, obesity, remote tobacco use and chronic heart failure.   Last echo was done 06/17/16 and showed an EF of 60-65% along with mild MR and moderate TR. Pulmonary systolic pressure was mildly to moderately elevated. Stress test done 06/18/16.  Admitted 11/10/17 due to seizures. Neurology consult obtained. Keppra dose increased. Brain MRI showed no acute abnormality. Head CT also negative. Discharged after 2 days. Admitted 4//20/19 due to COPD exacerbation. Given IV steroids, nebulizers and oxygen. Discharged after 2 days on prednisone taper.    She presents today for her follow-up visit with a chief complaint of minimal fatigue upon moderate exertion. She describes this as chronic in nature having been present for several years. She has associated trouble swallowing, headaches, light-headedness, weakness, chronic pain, anxiety and difficulty sleeping along with this. She denies any abdominal distention, palpitations, pedal edema, chest pain, shortness of breath, cough or weight gain. She says that she had an EEG done a couple of weeks ago.   Past Medical History:  Diagnosis Date  . (HFpEF) heart failure with preserved ejection fraction ([place])    a. 05/2016 Echo: EF 60-65%, mild to mod LVH, Gr1 DD, mild MR, mildly dil LA, mod TR, mildly to mod increased PASP.  [name] Acute diastolic heart failure ([place]) 01/27/2017  . Anxiety   . Chest pain 06/16/2016  . CHF (congestive heart failure) ([place])   . Chronic back pain   . Chronic diastolic congestive heart failure ([place]) 02/13/2017  . COPD (chronic obstructive pulmonary disease) ([place])   . Coronary artery disease    a. s/p remote PCI x 5;  b. 2006 s/p CABG x 3 ([place], [place]); b. 05/2016 MV: attenuation corrected images w/o  ischemia or wma-->Med rx.  . Coronary artery disease of native artery of native heart with stable angina pectoris ([place]) 06/17/2016  . Depression   . Diabetes mellitus without complication ([place])   . Essential hypertension 06/30/2016  . Heart attack ([place])    Total of 3 per pt.  . Hypertension   . Hypertensive urgency 06/03/2015  . Seizure ([place])   . Seizures ([place])    Past Surgical History:  Procedure Laterality Date  . ANKLE SURGERY    . COLONOSCOPY WITH PROPOFOL N/A 05/04/2017   Procedure: COLONOSCOPY WITH PROPOFOL;  Surgeon: [name], MD;  Location[facility] ENDOSCOPY;  Service: Endoscopy;  Laterality: N/A;  . CORONARY ANGIOPLASTY    . CORONARY ARTERY BYPASS GRAFT    . ESOPHAGOGASTRODUODENOSCOPY (EGD) WITH PROPOFOL N/A 05/04/2017   Procedure: ESOPHAGOGASTRODUODENOSCOPY (EGD) WITH PROPOFOL;  Surgeon: [name], MD;  Location: [facility] ENDOSCOPY;  Service: Endoscopy;  Laterality: N/A;  . FRACTURE SURGERY    . KNEE SURGERY    . MOUTH SURGERY Left 07/14/2017   Family History  Problem Relation Age of Onset  . Diabetes Unknown   . Hypertension Unknown   . Diabetes Mother   . Heart failure Mother   . Heart disease Mother   . Heart attack Mother   . Stroke Mother   . Depression Mother   . Hypertension Mother   . Cancer Sister        brain  . Hypertension Sister   . Diabetes Brother   . Hypertension Brother   .  Heart failure Sister   . Heart attack Sister   . SIDS Sister    Social History   Tobacco Use  . Smoking status: Former Smoker    Packs/day: 0.25    Years: 20.00    Pack years: 5.00    Types: Cigarettes    Last attempt to quit: 07/01/2006    Years since quitting: 11.5  . Smokeless tobacco: Never Used  Substance Use Topics  . Alcohol use: No    Alcohol/week: 0.0 standard drinks   Allergies  Allergen Reactions  . Aspirin Anaphylaxis   Prior to Admission medications   Medication Sig Start Date End Date Taking? Authorizing Provider  ACCU-CHEK AVIVA PLUS test  strip  05/19/16  Yes [provider]  albuterol (PROVENTIL HFA;VENTOLIN HFA) 108 (90 Base) MCG/ACT inhaler Inhale 2 puffs into the lungs every 6 (six) hours as needed for wheezing or shortness of breath. 08/30/15  Yes Epifanio Lesches, MD  albuterol (PROVENTIL) (2.5 MG/3ML) 0.083% nebulizer solution Take 2.5 mg every 4 (four) hours as needed by nebulization for wheezing or shortness of breath.   Yes [provider]  amLODipine (NORVASC) 10 MG tablet Take 10 mg by mouth daily. 11/12/17  Yes [provider]  atorvastatin (LIPITOR) 40 MG tablet Take 40 mg at bedtime by mouth.    Yes [provider]  Blood Glucose Monitoring Suppl (ACCU-CHEK AVIVA PLUS) w/Device KIT  06/13/16  Yes [provider]  Calcium Carbonate-Vitamin D3 (CALCIUM 600-D) 600-400 MG-UNIT TABS Take 1 tablet 2 (two) times daily by mouth.   Yes [provider]  carvedilol (COREG) 3.125 MG tablet Take 1 tablet (3.125 mg total) by mouth 2 (two) times daily with a meal. 08/12/16  Yes Jacey Pelc A, FNP  citalopram (CELEXA) 20 MG tablet Take 20 mg by mouth at bedtime.    Yes [provider]  clopidogrel (PLAVIX) 75 MG tablet Take 1 tablet (75 mg total) by mouth daily. 08/14/16  Yes Minna Merritts, MD  esomeprazole (NEXIUM) 40 MG capsule Take 40 mg daily at 12 noon by mouth.   Yes [provider]  HYDROcodone-acetaminophen (NORCO/VICODIN) 5-325 MG tablet Take 1 tablet by mouth 2 (two) times daily. Patient taking differently: Take 1 tablet by mouth 2 (two) times daily as needed for moderate pain.  12/23/17  Yes Molli Barrows, MD  insulin aspart (NOVOLOG) 100 UNIT/ML injection Inject 5-10 Units 2 (two) times daily with a meal into the skin. Take 10 units in the morning and 5 units in the evening with meal   Yes [provider]  insulin glargine (LANTUS) 100 UNIT/ML injection Inject 30-40 Units 2 (two) times daily into the skin. Inject 30 units in the morning at 40  units at bedtime.   Yes [provider]  isosorbide mononitrate (IMDUR) 30 MG 24 hr tablet Take 1 tablet (30 mg total) by mouth 2 (two) times daily. 08/12/16  Yes Darylene Price A, FNP  levETIRAcetam (KEPPRA) 1000 MG tablet Take 1 tablet (1,000 mg total) by mouth 2 (two) times daily. 11/12/17 01/28/19 Yes Pyreddy, Reatha Harps, MD  meloxicam (MOBIC) 7.5 MG tablet Take 7.5 mg by mouth daily as needed for pain.    Yes [provider]  metolazone (ZAROXOLYN) 2.5 MG tablet Take 1 tablet (2.5 mg) by mouth once daily as needed for a weigh > 320 lbs, take 30 minutes prior to you first dose of torsemide 11/06/17  Yes Gollan, Kathlene November, MD  montelukast (SINGULAIR) 10 MG tablet Take  1 tablet by mouth daily. 11/05/17  Yes [provider]  nitroGLYCERIN (NITROSTAT) 0.4 MG SL tablet DISSOLVE ONE TABLET UNDER THE TONGUE EVERY 5 MINUTES AS NEEDED FOR CHEST PAIN.  DO NOT EXCEED A TOTAL OF 3 DOSES IN 15 MINUTES 07/30/17  Yes Gollan, Kathlene November, MD  potassium chloride SA (K-DUR,KLOR-CON) 20 MEQ tablet Take 2 tablets (40 mEq total) by mouth 3 (three) times daily. Take extra 2 tablets (40 meq) if you take metolazone 11/26/17  Yes Darylene Price A, FNP  tiotropium (SPIRIVA) 18 MCG inhalation capsule Place 18 mcg into inhaler and inhale daily.   Yes [provider]  torsemide (DEMADEX) 20 MG tablet Take 2 tablets (40 mg total) by mouth 2 (two) times daily. Patient taking differently: Take 60 mg by mouth 2 (two) times daily.  11/26/17 01/28/19 Yes Omunique Pederson, Otila Kluver A, FNP  traZODone (DESYREL) 50 MG tablet Take 50 mg by mouth at bedtime.   Yes [provider]  UNIFINE PENTIPS 31G X 6 MM [place]  07/23/16  Yes [provider]    Review of Systems  Constitutional: Positive for fatigue. Negative for appetite change.  HENT: Positive for trouble swallowing ("chronic"). Negative for congestion, postnasal drip and sore throat.   Eyes: Negative.   Respiratory: Negative for cough, chest tightness and  shortness of breath.   Cardiovascular: Negative for chest pain, palpitations and leg swelling.  Gastrointestinal: Negative for abdominal distention, abdominal pain and nausea.  Endocrine: Negative.   Genitourinary: Negative.   Musculoskeletal: Positive for arthralgias (left leg) and back pain (chronic). Negative for neck pain.  Skin: Negative.   Allergic/Immunologic: Negative.   Neurological: Positive for weakness (legs), light-headedness, numbness (right leg) and headaches. Negative for dizziness.  Hematological: Negative for adenopathy. Does not bruise/bleed easily.  Psychiatric/Behavioral: Positive for dysphoric mood and sleep disturbance. Negative for suicidal ideas. The patient is nervous/anxious (worsening stress).    Vitals:   01/27/18 1019  BP: 106/60  Pulse: (!) 58  Resp: 18  SpO2: 99%  Weight: (!) 303 lb 8 oz (137.7 kg)  Height: _0  (1.803 m)   Wt Readings from Last 3 Encounters:  01/27/18 (!) 303 lb 8 oz (137.7 kg)  12/23/17 298 lb (135.2 kg)  11/26/17 (!) 308 lb (139.7 kg)   Lab Results  Component Value Date   CREATININE 1.34 (H) 12/03/2017   CREATININE 1.93 (H) 11/26/2017   CREATININE 1.30 (H) 11/12/2017    Physical Exam  Constitutional: She is oriented to person, place, and time. She appears well-developed and well-nourished.  HENT:  Head: Normocephalic and atraumatic.  Neck: Normal range of motion. Neck supple. No JVD present.  Cardiovascular: Regular rhythm. Bradycardia present.  Pulmonary/Chest: Effort normal. She has no wheezes. She has no rales.  Abdominal: Soft. She exhibits no distension. There is no tenderness.  Musculoskeletal: She exhibits edema (trace edema around bilateral ankles). She exhibits no tenderness.  Neurological: She is alert and oriented to person, place, and time.  Skin: Skin is warm and dry.  Psychiatric: She has a normal mood and affect. Her behavior is normal. Thought content normal.  Nursing note and vitals  reviewed.  Assessment & Plan:  1: Chronic heart failure with preserved ejection fraction- - NYHA class II - euvolemic today - weighing daily and says that her home weight has gradually declined.  Reminded to call for an overnight weight gain of >2 pounds or a weekly weight gain of >5 pounds.  - weight down 5 pounds since she was last  here 2 months ago - has not been adding salt to her foods and is trying to eat low sodium foods - trying to keep fluid intake closer to 60 ounces daily but admits that it's difficult to do - saw her cardiologist Rockey Situ) 11/09/17 - BNP 03/16/17 was 166.0 - PharmD reconciled medications with the patient  2: HTN- - BP looks good today although on the low side - BMP from 12/03/17 reviewed; sodium 140, potassium 3.7, creatinine 1.34 and GFR 45 - sees PCP at [facility]  3: Diabetes-  - fasting glucose at home yesterday was 108 - A1c on 08/16/17 was 7.7%  Patient did not bring her medications nor a list. Each medication was verbally reviewed with the patient and she was encouraged to bring the bottles to every visit to confirm accuracy of list.  Return in 4 months or sooner for any questions/problems before then.

## 2018-01-27 ENCOUNTER — Encounter: Payer: Self-pay | Admitting: Family

## 2018-01-27 ENCOUNTER — Ambulatory Visit: Payer: Medicare HMO | Attending: Family | Admitting: Family

## 2018-01-27 VITALS — BP 106/60 | HR 58 | Resp 18 | Ht 71.0 in | Wt 303.5 lb

## 2018-01-27 DIAGNOSIS — Z7902 Long term (current) use of antithrombotics/antiplatelets: Secondary | ICD-10-CM | POA: Diagnosis not present

## 2018-01-27 DIAGNOSIS — M549 Dorsalgia, unspecified: Secondary | ICD-10-CM | POA: Diagnosis not present

## 2018-01-27 DIAGNOSIS — F329 Major depressive disorder, single episode, unspecified: Secondary | ICD-10-CM | POA: Diagnosis not present

## 2018-01-27 DIAGNOSIS — E669 Obesity, unspecified: Secondary | ICD-10-CM | POA: Diagnosis not present

## 2018-01-27 DIAGNOSIS — Z87891 Personal history of nicotine dependence: Secondary | ICD-10-CM | POA: Insufficient documentation

## 2018-01-27 DIAGNOSIS — I5032 Chronic diastolic (congestive) heart failure: Secondary | ICD-10-CM | POA: Diagnosis not present

## 2018-01-27 DIAGNOSIS — Z6841 Body Mass Index (BMI) 40.0 and over, adult: Secondary | ICD-10-CM | POA: Insufficient documentation

## 2018-01-27 DIAGNOSIS — Z886 Allergy status to analgesic agent status: Secondary | ICD-10-CM | POA: Diagnosis not present

## 2018-01-27 DIAGNOSIS — E119 Type 2 diabetes mellitus without complications: Secondary | ICD-10-CM | POA: Insufficient documentation

## 2018-01-27 DIAGNOSIS — Z951 Presence of aortocoronary bypass graft: Secondary | ICD-10-CM | POA: Insufficient documentation

## 2018-01-27 DIAGNOSIS — R569 Unspecified convulsions: Secondary | ICD-10-CM | POA: Insufficient documentation

## 2018-01-27 DIAGNOSIS — Z79899 Other long term (current) drug therapy: Secondary | ICD-10-CM | POA: Diagnosis not present

## 2018-01-27 DIAGNOSIS — I509 Heart failure, unspecified: Secondary | ICD-10-CM | POA: Diagnosis present

## 2018-01-27 DIAGNOSIS — Z818 Family history of other mental and behavioral disorders: Secondary | ICD-10-CM | POA: Insufficient documentation

## 2018-01-27 DIAGNOSIS — I2510 Atherosclerotic heart disease of native coronary artery without angina pectoris: Secondary | ICD-10-CM | POA: Diagnosis not present

## 2018-01-27 DIAGNOSIS — Z794 Long term (current) use of insulin: Secondary | ICD-10-CM | POA: Diagnosis not present

## 2018-01-27 DIAGNOSIS — Z955 Presence of coronary angioplasty implant and graft: Secondary | ICD-10-CM | POA: Insufficient documentation

## 2018-01-27 DIAGNOSIS — Z823 Family history of stroke: Secondary | ICD-10-CM | POA: Diagnosis not present

## 2018-01-27 DIAGNOSIS — Z8249 Family history of ischemic heart disease and other diseases of the circulatory system: Secondary | ICD-10-CM | POA: Diagnosis not present

## 2018-01-27 DIAGNOSIS — F419 Anxiety disorder, unspecified: Secondary | ICD-10-CM | POA: Insufficient documentation

## 2018-01-27 DIAGNOSIS — I252 Old myocardial infarction: Secondary | ICD-10-CM | POA: Diagnosis not present

## 2018-01-27 DIAGNOSIS — I110 Hypertensive heart disease with heart failure: Secondary | ICD-10-CM | POA: Insufficient documentation

## 2018-01-27 DIAGNOSIS — Z833 Family history of diabetes mellitus: Secondary | ICD-10-CM | POA: Insufficient documentation

## 2018-01-27 DIAGNOSIS — G8929 Other chronic pain: Secondary | ICD-10-CM | POA: Insufficient documentation

## 2018-01-27 DIAGNOSIS — I10 Essential (primary) hypertension: Secondary | ICD-10-CM

## 2018-01-27 NOTE — Patient Instructions (Signed)
Continue weighing daily and call for an overnight weight gain of > 2 pounds or a weekly weight gain of >5 pounds. 

## 2018-01-28 ENCOUNTER — Encounter: Payer: Self-pay | Admitting: Family

## 2018-02-12 ENCOUNTER — Encounter: Payer: Medicare HMO | Admitting: Anesthesiology

## 2018-02-15 ENCOUNTER — Telehealth: Payer: Self-pay

## 2018-02-15 ENCOUNTER — Encounter: Payer: 59 | Admitting: Anesthesiology

## 2018-02-15 NOTE — Telephone Encounter (Signed)
Pt called and [employer] didn't show up to bring her to appt, She is out of meds,in a lot of pain and is requesting appt this week, Andree Elk is not here this week. Pt wants call back

## 2018-02-18 ENCOUNTER — Other Ambulatory Visit: Payer: Self-pay

## 2018-02-18 ENCOUNTER — Ambulatory Visit: Payer: 59 | Attending: Anesthesiology | Admitting: Anesthesiology

## 2018-02-18 ENCOUNTER — Encounter: Payer: Self-pay | Admitting: Anesthesiology

## 2018-02-18 VITALS — BP 98/48 | HR 57 | Temp 98.5°F | Resp 20 | Ht 71.0 in | Wt 303.0 lb

## 2018-02-18 DIAGNOSIS — M5432 Sciatica, left side: Secondary | ICD-10-CM

## 2018-02-18 DIAGNOSIS — Z79899 Other long term (current) drug therapy: Secondary | ICD-10-CM | POA: Diagnosis not present

## 2018-02-18 DIAGNOSIS — M4716 Other spondylosis with myelopathy, lumbar region: Secondary | ICD-10-CM | POA: Diagnosis not present

## 2018-02-18 DIAGNOSIS — M170 Bilateral primary osteoarthritis of knee: Secondary | ICD-10-CM | POA: Diagnosis not present

## 2018-02-18 DIAGNOSIS — F1190 Opioid use, unspecified, uncomplicated: Secondary | ICD-10-CM

## 2018-02-18 DIAGNOSIS — M5431 Sciatica, right side: Secondary | ICD-10-CM | POA: Diagnosis not present

## 2018-02-18 DIAGNOSIS — M47817 Spondylosis without myelopathy or radiculopathy, lumbosacral region: Secondary | ICD-10-CM | POA: Insufficient documentation

## 2018-02-18 DIAGNOSIS — M48062 Spinal stenosis, lumbar region with neurogenic claudication: Secondary | ICD-10-CM | POA: Insufficient documentation

## 2018-02-18 DIAGNOSIS — M5116 Intervertebral disc disorders with radiculopathy, lumbar region: Secondary | ICD-10-CM | POA: Insufficient documentation

## 2018-02-18 DIAGNOSIS — M545 Low back pain: Secondary | ICD-10-CM | POA: Diagnosis present

## 2018-02-18 DIAGNOSIS — M5136 Other intervertebral disc degeneration, lumbar region: Secondary | ICD-10-CM

## 2018-02-18 DIAGNOSIS — Z794 Long term (current) use of insulin: Secondary | ICD-10-CM | POA: Insufficient documentation

## 2018-02-18 DIAGNOSIS — Z7902 Long term (current) use of antithrombotics/antiplatelets: Secondary | ICD-10-CM | POA: Insufficient documentation

## 2018-02-18 DIAGNOSIS — G894 Chronic pain syndrome: Secondary | ICD-10-CM | POA: Diagnosis not present

## 2018-02-18 DIAGNOSIS — Z79891 Long term (current) use of opiate analgesic: Secondary | ICD-10-CM | POA: Diagnosis not present

## 2018-02-18 MED ORDER — HYDROCODONE-ACETAMINOPHEN 5-325 MG PO TABS: 1.0000 | ORAL_TABLET | Freq: Three times a day (TID) | ORAL | 0 refills | Status: DC

## 2018-02-18 NOTE — Progress Notes (Signed)
Nursing Pain Medication Assessment:  Safety precautions to be maintained throughout the outpatient stay will include: orient to surroundings, keep bed in low position, maintain call bell within reach at all times, provide assistance with transfer out of bed and ambulation.  Medication Inspection Compliance: Pill count conducted under aseptic conditions, in front of the patient. Neither the pills nor the bottle was removed from the patient's sight at any time. Once count was completed pills were immediately returned to the patient in their original bottle.  Medication: Tramadol (Ultram) Pill/Patch Count: 0 of 60 pills remain Pill/Patch Appearance: Markings consistent with prescribed medication Bottle Appearance: Standard pharmacy container. Clearly labeled. Filled Date: 08/30 / 2019 Last Medication intake:  Ran out of medicine more than 48 hours ago

## 2018-02-18 NOTE — Patient Instructions (Signed)
You were given 2 prescriptions for Hydrocodone today.  ____________________________________________________________________________________________  Preparing for Procedure with Sedation  Instructions: . Oral Intake: Do not eat or drink anything for at least 8 hours prior to your procedure. . Transportation: Public transportation is not allowed. Bring an adult driver. The driver must be physically present in our waiting room before any procedure can be started. [name] Physical Assistance: Bring an adult physically capable of assisting you, in the event you need help. This adult should keep you company at home for at least 6 hours after the procedure. . Blood Pressure Medicine: Take your blood pressure medicine with a sip of water the morning of the procedure. . Blood thinners: Notify our staff if you are taking any blood thinners. Depending on which one you take, there will be specific instructions on how and when to stop it. . Diabetics on insulin: Notify the staff so that you can be scheduled 1st case in the morning. If your diabetes requires high dose insulin, take only  of your normal insulin dose the morning of the procedure and notify the staff that you have done so. . Preventing infections: Shower with an antibacterial soap the morning of your procedure. . Build-up your immune system: Take 1000 mg of Vitamin C with every meal (3 times a day) the day prior to your procedure. [name] Antibiotics: Inform the staff if you have a condition or reason that requires you to take antibiotics before dental procedures. . Pregnancy: If you are pregnant, call and cancel the procedure. . Sickness: If you have a cold, fever, or any active infections, call and cancel the procedure. . Arrival: You must be in the facility at least 30 minutes prior to your scheduled procedure. . Children: Do not bring children with you. . Dress appropriately: Bring dark clothing that you would not mind if they get  stained. . Valuables: Do not bring any jewelry or valuables.  Procedure appointments are reserved for interventional treatments only. [name] No Prescription Refills. . No medication changes will be discussed during procedure appointments. . No disability issues will be discussed.  Reasons to call and reschedule or cancel your procedure: (Following these recommendations will minimize the risk of a serious complication.) . Surgeries: Avoid having procedures within 2 weeks of any surgery. (Avoid for 2 weeks before or after any surgery). . Flu Shots: Avoid having procedures within 2 weeks of a flu shots or . (Avoid for 2 weeks before or after immunizations). . Barium: Avoid having a procedure within 7-10 days after having had a radiological study involving the use of radiological contrast. (Myelograms, Barium swallow or enema study). . Heart attacks: Avoid any elective procedures or surgeries for the initial 6 months after a "Myocardial Infarction" (Heart Attack). . Blood thinners: It is imperative that you stop these medications before procedures. Let us know if you if you take any blood thinner.  . Infection: Avoid procedures during or within two weeks of an infection (including chest colds or gastrointestinal problems). Symptoms associated with infections include: Localized redness, fever, chills, night sweats or profuse sweating, burning sensation when voiding, cough, congestion, stuffiness, runny nose, sore throat, diarrhea, nausea, vomiting, cold or Flu symptoms, recent or current infections. It is specially important if the infection is over the area that we intend to treat. [name] Heart and lung problems: Symptoms that may suggest an active cardiopulmonary problem include: cough, chest pain, breathing difficulties or shortness of breath, dizziness, ankle swelling, uncontrolled high or unusually low blood pressure, and/or palpitations.  If you are experiencing any of these symptoms, cancel your procedure and  contact your primary care physician for an evaluation.  Remember:  Regular Business hours are:  Monday to Thursday 8:00 AM to 4:00 PM  Provider's Schedule: Milinda Pointer, MD:  Procedure days: Tuesday and Thursday 7:30 AM to 4:00 PM  Gillis Santa, MD:  Procedure days: Monday and Wednesday 7:30 AM to 4:00 PM   ____________________________________________________________________________________________ Stop Plavix 10 days before your procedure.

## 2018-02-22 NOTE — Progress Notes (Signed)
Subjective:  Patient ID: Tina Hayes, female    DOB: 1945/11/13  Age: 72 y.o. MRN: [id number]  CC: Back Pain (lower)   Procedure: None  HPI Tina Hayes presents for reevaluation.  She was last seen 2 months ago and is been doing reasonably well with her current medication regimen.  She is taking Vicodin twice a day and this keeps her pain under reasonable control.  She still having some chronic low back pain with radiation into the bilateral lower extremities.  She has received previous epidural injections for this and these work well for her.  Her last injection was in July and she is beginning to get a little bit of wind up of the same quality characteristic and distribution of lower leg pain as previously reported.  She generally gets 75 to 80% improvement in the leg pain with approximately a 45 to 50% improvement in the low back pain.  The low back pain she reports following the injection is more of a muscular type pain.  Otherwise she is tolerating her medications without difficulty.  I have reviewed her narcotic assessment sheet and she reports that she is deriving good functional lifestyle improvement with the medications.  Outpatient Medications Prior to Visit  Medication Sig Dispense Refill  . ACCU-CHEK AVIVA PLUS test strip     . albuterol (PROVENTIL HFA;VENTOLIN HFA) 108 (90 Base) MCG/ACT inhaler Inhale 2 puffs into the lungs every 6 (six) hours as needed for wheezing or shortness of breath. 1 Inhaler 2  . albuterol (PROVENTIL) (2.5 MG/3ML) 0.083% nebulizer solution Take 2.5 mg every 4 (four) hours as needed by nebulization for wheezing or shortness of breath.    [name] amLODipine (NORVASC) 10 MG tablet Take 10 mg by mouth daily.    [name] atorvastatin (LIPITOR) 40 MG tablet Take 40 mg at bedtime by mouth.     . Blood Glucose Monitoring Suppl (ACCU-CHEK AVIVA PLUS) w/Device KIT     . Calcium Carbonate-Vitamin D3 (CALCIUM 600-D) 600-400 MG-UNIT TABS Take 1 tablet 2 (two) times daily by  mouth.    . carvedilol (COREG) 3.125 MG tablet Take 1 tablet (3.125 mg total) by mouth 2 (two) times daily with a meal. 180 tablet 3  . citalopram (CELEXA) 20 MG tablet Take 20 mg by mouth at bedtime.     . clopidogrel (PLAVIX) 75 MG tablet Take 1 tablet (75 mg total) by mouth daily. 30 tablet 0  . esomeprazole (NEXIUM) 40 MG capsule Take 40 mg daily at 12 noon by mouth.    . insulin aspart (NOVOLOG) 100 UNIT/ML injection Inject 5-10 Units 2 (two) times daily with a meal into the skin. Take 10 units in the morning and 5 units in the evening with meal    . insulin glargine (LANTUS) 100 UNIT/ML injection Inject 30-40 Units 2 (two) times daily into the skin. Inject 30 units in the morning at 40 units at bedtime.    . isosorbide mononitrate (IMDUR) 30 MG 24 hr tablet Take 1 tablet (30 mg total) by mouth 2 (two) times daily. 180 tablet 3  . levETIRAcetam (KEPPRA) 1000 MG tablet Take 1 tablet (1,000 mg total) by mouth 2 (two) times daily. 60 tablet 0  . meloxicam (MOBIC) 7.5 MG tablet Take 7.5 mg by mouth daily as needed for pain.     . metolazone (ZAROXOLYN) 2.5 MG tablet Take 1 tablet (2.5 mg) by mouth once daily as needed for a weigh > 320 lbs, take 30 minutes prior to  you first dose of torsemide 30 tablet 0  . montelukast (SINGULAIR) 10 MG tablet Take 1 tablet by mouth daily.    . nitroGLYCERIN (NITROSTAT) 0.4 MG SL tablet DISSOLVE ONE TABLET UNDER THE TONGUE EVERY 5 MINUTES AS NEEDED FOR CHEST PAIN.  DO NOT EXCEED A TOTAL OF 3 DOSES IN 15 MINUTES 25 tablet 0  . potassium chloride SA (K-DUR,KLOR-CON) 20 MEQ tablet Take 2 tablets (40 mEq total) by mouth 3 (three) times daily. Take extra 2 tablets (40 meq) if you take metolazone 120 tablet 3  . tiotropium (SPIRIVA) 18 MCG inhalation capsule Place 18 mcg into inhaler and inhale daily.    [name] torsemide (DEMADEX) 20 MG tablet Take 2 tablets (40 mg total) by mouth 2 (two) times daily. (Patient taking differently: Take 60 mg by mouth 2 (two) times daily. ) 180  tablet 0  . traZODone (DESYREL) 50 MG tablet Take 50 mg by mouth at bedtime.    [name] UNIFINE PENTIPS 31G X 6 MM MISC     . HYDROcodone-acetaminophen (NORCO/VICODIN) 5-325 MG tablet Take 1 tablet by mouth 2 (two) times daily. (Patient taking differently: Take 1 tablet by mouth 2 (two) times daily as needed for moderate pain. ) 60 tablet 0   No facility-administered medications prior to visit.     Review of Systems CNS: No confusion or sedation Cardiac: No angina or palpitations GI: No abdominal pain or constipation Constitutional: No nausea vomiting fevers or chills  Objective:  BP (!) 98/48   Pulse (!) 57   Temp 98.5 F (36.9 C) (Oral)   Resp 20   Ht '5\' 11"'  (1.803 m)   Wt (!) 303 lb (137.4 kg)   SpO2 100%   BMI 42.26 kg/m    BP Readings from Last 3 Encounters:  02/18/18 (!) 98/48  01/27/18 106/60  12/23/17 107/85     Wt Readings from Last 3 Encounters:  02/18/18 (!) 303 lb (137.4 kg)  01/27/18 (!) 303 lb 8 oz (137.7 kg)  12/23/17 298 lb (135.2 kg)     Physical Exam Pt is alert and oriented PERRL EOMI HEART IS RRR no murmur or rub LCTA no wheezing or rales MUSCULOSKELETAL some paraspinous muscle tenderness is noted but no overt trigger points.  She is using a wheelchair for ambulation and her muscle tone and bulk is at baseline  Labs  Lab Results  Component Value Date   HGBA1C 7.7 (H) 08/16/2017   HGBA1C 8.5 (H) 01/29/2017   HGBA1C 8.5 (H) 06/17/2016   Lab Results  Component Value Date   LDLCALC 58 01/30/2017   CREATININE 1.34 (H) 12/03/2017    -------------------------------------------------------------------------------------------------------------------- Lab Results  Component Value Date   WBC 6.2 11/11/2017   HGB 10.8 (L) 11/11/2017   HCT 33.9 (L) 11/11/2017   PLT 263 11/11/2017   GLUCOSE 229 (H) 12/03/2017   CHOL 120 01/30/2017   TRIG 73 01/30/2017   HDL 47 01/30/2017   LDLCALC 58 01/30/2017   ALT 14 11/10/2017   AST 20 11/10/2017   NA  140 12/03/2017   K 3.7 12/03/2017   CL 95 (L) 12/03/2017   CREATININE 1.34 (H) 12/03/2017   BUN 56 (H) 12/03/2017   CO2 35 (H) 12/03/2017   INR 0.99 11/13/2016   HGBA1C 7.7 (H) 08/16/2017    --------------------------------------------------------------------------------------------------------------------- No results found.   Assessment & Plan:   Chiquitta was seen today for back pain.  Diagnoses and all orders for this visit:  Bilateral sciatica -  Lumbar Epidural Injection; Future  Facet arthritis of lumbosacral region  Primary osteoarthritis of both knees  DDD (degenerative disc disease), lumbar -     Lumbar Epidural Injection; Future  Spinal stenosis of lumbar region with neurogenic claudication -     Lumbar Epidural Injection; Future  Chronic pain syndrome  Chronic, continuous use of opioids  Lumbar spondylosis with myelopathy -     Lumbar Epidural Injection; Future  Other orders -     Discontinue: HYDROcodone-acetaminophen (NORCO/VICODIN) 5-325 MG tablet; Take 1 tablet by mouth 3 (three) times daily. -     HYDROcodone-acetaminophen (NORCO/VICODIN) 5-325 MG tablet; Take 1 tablet by mouth 3 (three) times daily.        ----------------------------------------------------------------------------------------------------------------------  Problem List Items Addressed This Visit    None    Visit Diagnoses    Bilateral sciatica    -  Primary   Relevant Orders   Lumbar Epidural Injection   Facet arthritis of lumbosacral region       Relevant Medications   HYDROcodone-acetaminophen (NORCO/VICODIN) 5-325 MG tablet   Primary osteoarthritis of both knees       DDD (degenerative disc disease), lumbar       Relevant Medications   HYDROcodone-acetaminophen (NORCO/VICODIN) 5-325 MG tablet   Other Relevant Orders   Lumbar Epidural Injection   Spinal stenosis of lumbar region with neurogenic claudication       Relevant Medications   HYDROcodone-acetaminophen  (NORCO/VICODIN) 5-325 MG tablet   Other Relevant Orders   Lumbar Epidural Injection   Chronic pain syndrome       Chronic, continuous use of opioids       Lumbar spondylosis with myelopathy       Relevant Orders   Lumbar Epidural Injection        ----------------------------------------------------------------------------------------------------------------------  1. Bilateral sciatica We will schedule her for repeat injection in approximately 4 to 6 weeks.  She has responded favorably to these in the past and receives them periodically for her intractable low back pain.  She is considered a nonsurgical candidate and unfortunately conservative therapy has failed to give her adequate pain relief in the past. - Lumbar Epidural Injection; Future  2. Facet arthritis of lumbosacral region As above and continue efforts at weight loss and core strengthening  3. Primary osteoarthritis of both knees As above  4. DDD (degenerative disc disease), lumbar As above - Lumbar Epidural Injection; Future  5. Spinal stenosis of lumbar region with neurogenic claudication As above - Lumbar Epidural Injection; Future  6. Chronic pain syndrome   7. Chronic, continuous use of opioids We have reviewed the [facility] practitioner database information and it is appropriate.  Refill will be given today for November 23  8. Lumbar spondylosis with myelopathy As above - Lumbar Epidural Injection; Future    ----------------------------------------------------------------------------------------------------------------------  I am having Tina Hayes maintain her tiotropium, traZODone, albuterol, citalopram, ACCU-CHEK AVIVA PLUS, ACCU-CHEK AVIVA PLUS, UNIFINE PENTIPS, insulin aspart, carvedilol, isosorbide mononitrate, clopidogrel, atorvastatin, insulin glargine, esomeprazole, Calcium Carbonate-Vitamin D3, albuterol, meloxicam, nitroGLYCERIN, metolazone, montelukast, levETIRAcetam, amLODipine,  potassium chloride SA, torsemide, and HYDROcodone-acetaminophen.   Meds ordered this encounter  Medications  . DISCONTD: HYDROcodone-acetaminophen (NORCO/VICODIN) 5-325 MG tablet    Sig: Take 1 tablet by mouth 3 (three) times daily.    Dispense:  90 tablet    Refill:  0    Must last 30 days Do not fill until [id number]  . HYDROcodone-acetaminophen (NORCO/VICODIN) 5-325 MG tablet    Sig: Take 1 tablet by mouth 3 (  three) times daily.    Dispense:  90 tablet    Refill:  0    Must last 30 days Do not fill until [id number]   Patient's Medications  New Prescriptions   No medications on file  Previous Medications   ACCU-CHEK AVIVA PLUS TEST STRIP       ALBUTEROL (PROVENTIL HFA;VENTOLIN HFA) 108 (90 BASE) MCG/ACT INHALER    Inhale 2 puffs into the lungs every 6 (six) hours as needed for wheezing or shortness of breath.   ALBUTEROL (PROVENTIL) (2.5 MG/3ML) 0.083% NEBULIZER SOLUTION    Take 2.5 mg every 4 (four) hours as needed by nebulization for wheezing or shortness of breath.   AMLODIPINE (NORVASC) 10 MG TABLET    Take 10 mg by mouth daily.   ATORVASTATIN (LIPITOR) 40 MG TABLET    Take 40 mg at bedtime by mouth.    BLOOD GLUCOSE MONITORING SUPPL (ACCU-CHEK AVIVA PLUS) W/DEVICE KIT       CALCIUM CARBONATE-VITAMIN D3 (CALCIUM 600-D) 600-400 MG-UNIT TABS    Take 1 tablet 2 (two) times daily by mouth.   CARVEDILOL (COREG) 3.125 MG TABLET    Take 1 tablet (3.125 mg total) by mouth 2 (two) times daily with a meal.   CITALOPRAM (CELEXA) 20 MG TABLET    Take 20 mg by mouth at bedtime.    CLOPIDOGREL (PLAVIX) 75 MG TABLET    Take 1 tablet (75 mg total) by mouth daily.   ESOMEPRAZOLE (NEXIUM) 40 MG CAPSULE    Take 40 mg daily at 12 noon by mouth.   INSULIN ASPART (NOVOLOG) 100 UNIT/ML INJECTION    Inject 5-10 Units 2 (two) times daily with a meal into the skin. Take 10 units in the morning and 5 units in the evening with meal   INSULIN GLARGINE (LANTUS) 100 UNIT/ML INJECTION    Inject 30-40 Units 2  (two) times daily into the skin. Inject 30 units in the morning at 40 units at bedtime.   ISOSORBIDE MONONITRATE (IMDUR) 30 MG 24 HR TABLET    Take 1 tablet (30 mg total) by mouth 2 (two) times daily.   LEVETIRACETAM (KEPPRA) 1000 MG TABLET    Take 1 tablet (1,000 mg total) by mouth 2 (two) times daily.   MELOXICAM (MOBIC) 7.5 MG TABLET    Take 7.5 mg by mouth daily as needed for pain.    METOLAZONE (ZAROXOLYN) 2.5 MG TABLET    Take 1 tablet (2.5 mg) by mouth once daily as needed for a weigh > 320 lbs, take 30 minutes prior to you first dose of torsemide   MONTELUKAST (SINGULAIR) 10 MG TABLET    Take 1 tablet by mouth daily.   NITROGLYCERIN (NITROSTAT) 0.4 MG SL TABLET    DISSOLVE ONE TABLET UNDER THE TONGUE EVERY 5 MINUTES AS NEEDED FOR CHEST PAIN.  DO NOT EXCEED A TOTAL OF 3 DOSES IN 15 MINUTES   POTASSIUM CHLORIDE SA (K-DUR,KLOR-CON) 20 MEQ TABLET    Take 2 tablets (40 mEq total) by mouth 3 (three) times daily. Take extra 2 tablets (40 meq) if you take metolazone   TIOTROPIUM (SPIRIVA) 18 MCG INHALATION CAPSULE    Place 18 mcg into inhaler and inhale daily.   TORSEMIDE (DEMADEX) 20 MG TABLET    Take 2 tablets (40 mg total) by mouth 2 (two) times daily.   TRAZODONE (DESYREL) 50 MG TABLET    Take 50 mg by mouth at bedtime.   UNIFINE PENTIPS 31G X 6 MM MISC  Modified Medications   Modified Medication Previous Medication   HYDROCODONE-ACETAMINOPHEN (NORCO/VICODIN) 5-325 MG TABLET HYDROcodone-acetaminophen (NORCO/VICODIN) 5-325 MG tablet      Take 1 tablet by mouth 3 (three) times daily.    Take 1 tablet by mouth 2 (two) times daily.  Discontinued Medications   No medications on file   ----------------------------------------------------------------------------------------------------------------------  Follow-up: Return in about 1 month (around 03/21/2018) for evaluation, procedure.    Molli Barrows, MD

## 2018-02-26 ENCOUNTER — Other Ambulatory Visit: Payer: Self-pay

## 2018-02-26 ENCOUNTER — Encounter
Admission: RE | Admit: 2018-02-26 | Discharge: 2018-02-26 | Disposition: A | Discharge status: Home / Self Care | Payer: Medicare HMO | Source: Ambulatory Visit | Attending: Surgery | Admitting: Surgery

## 2018-02-26 DIAGNOSIS — Z01812 Encounter for preprocedural laboratory examination: Secondary | ICD-10-CM | POA: Insufficient documentation

## 2018-02-26 DIAGNOSIS — D367 Benign neoplasm of other specified sites: Secondary | ICD-10-CM | POA: Diagnosis not present

## 2018-02-26 LAB — CBC
HCT: 34.4 % — ABNORMAL LOW (ref 36.0–46.0)
HEMOGLOBIN: 10.6 g/dL — AB (ref 12.0–15.0)
MCH: 27 pg (ref 26.0–34.0)
MCHC: 30.8 g/dL (ref 30.0–36.0)
MCV: 87.8 fL (ref 80.0–100.0)
Platelets: 314 10*3/uL (ref 150–400)
RBC: 3.92 MIL/uL (ref 3.87–5.11)
RDW: 15.9 % — ABNORMAL HIGH (ref 11.5–15.5)
WBC: 8.1 10*3/uL (ref 4.0–10.5)
nRBC: 0 % (ref 0.0–0.2)

## 2018-02-26 LAB — BASIC METABOLIC PANEL
Anion gap: 9 (ref 5–15)
BUN: 13 mg/dL (ref 8–23)
CHLORIDE: 105 mmol/L (ref 98–111)
CO2: 27 mmol/L (ref 22–32)
Calcium: 9 mg/dL (ref 8.9–10.3)
Creatinine, Ser: 0.97 mg/dL (ref 0.44–1.00)
GFR calc Af Amer: >60 mL/min (ref >60)
GFR calc non Af Amer: 58 mL/min — ABNORMAL LOW (ref >60)
GLUCOSE: 95 mg/dL (ref 70–99)
POTASSIUM: 3.7 mmol/L (ref 3.5–5.1)
Sodium: 141 mmol/L (ref 135–145)

## 2018-02-26 NOTE — Patient Instructions (Signed)
Your procedure is scheduled on: March 02, 2018 TUESDAY Report to Day Surgery on the 2nd floor of the [facility]. To find out your arrival time, please call [phone] between 1PM - 3PM on: March 01, 2018 MONDAY  REMEMBER: Instructions that are not followed completely may result in serious medical risk, up to and including death; or upon the discretion of your surgeon and anesthesiologist your surgery may need to be rescheduled.  Do not eat food after midnight the night before surgery.  No gum chewing, lozengers or hard candies.  You may however, drink CLEAR liquids up to 2 hours before you are scheduled to arrive for your surgery. Do not drink anything within 2 hours of the start of your surgery.  Clear liquids include: - water   Do NOT drink anything that is not on this list.  Type 1 and Type 2 diabetics should only drink water.  No Alcohol for 24 hours before or after surgery.  No Smoking including e-cigarettes for 24 hours prior to surgery.  No chewable tobacco products for at least 6 hours prior to surgery.  No nicotine patches on the day of surgery.  On the morning of surgery brush your teeth with toothpaste and water, you may rinse your mouth with mouthwash if you wish. Do not swallow any toothpaste or mouthwash.  Notify your doctor if there is any change in your medical condition (cold, fever, infection).  Do not wear jewelry, make-up, hairpins, clips or nail polish.  Do not wear lotions, powders, or perfumes.   Do not shave 48 hours prior to surgery.   Contacts and dentures may not be worn into surgery.  Do not bring valuables to the hospital, including drivers license, insurance or credit cards.  [facility] is not responsible for any belongings or valuables.   TAKE THESE MEDICATIONS THE MORNING OF SURGERY: AMLODIPINE(NORVASC) CARVEDILOL ISOSORBIDE KEPPRA    Use CHG Soap as directed on instruction sheet.  Fleets enema or Magnesium Citrate as  directed.  Use inhalers on the day of surgery   Take 1/2 of usual insulin dose the night before surgery and none on the morning of surgery.  Follow recommendations from Cardiologist, Pulmonologist or PCP regarding stopping  Plavix( STOP FOR 7 DAYS)  Stop Anti-inflammatories (NSAIDS) such as Advil, Aleve, Ibuprofen, Motrin, Naproxen, Naprosyn and Aspirin based products such as Excedrin, Goodys Powder, BC Powder. (May take Tylenol or Acetaminophen if needed.)  Stop ANY OVER THE COUNTER supplements until after surgery. (May continue Vitamin D, Vitamin B, and multivitamin AND POTASSIUM  Wear comfortable clothing (specific to your surgery type) to the hospital.  Plan for stool softeners for home use.  If you are being admitted to the hospital overnight, leave your suitcase in the car. After surgery it may be brought to your room.  If you are being discharged the day of surgery, you will not be allowed to drive home. You will need a responsible adult to drive you home and stay with you that night.   If you are taking public transportation, you will need to have a responsible adult with you. Please confirm with your physician that it is acceptable to use public transportation.   Please call [phone] if you have any questions about these instructions.

## 2018-03-01 MED ORDER — DEXTROSE 5 % IV SOLN
3.0000 g | Freq: Once | INTRAVENOUS | Status: AC
  Administered 2018-03-02: 3 g via INTRAVENOUS
  Filled 2018-03-01 (×2): qty 3000

## 2018-03-02 ENCOUNTER — Ambulatory Visit
Admission: RE | Admit: 2018-03-02 | Discharge: 2018-03-02 | Disposition: A | Discharge status: Home / Self Care | Payer: Medicare HMO | Source: Ambulatory Visit | Attending: Surgery | Admitting: Surgery

## 2018-03-02 ENCOUNTER — Ambulatory Visit: Payer: Medicare HMO | Admitting: Anesthesiology

## 2018-03-02 ENCOUNTER — Other Ambulatory Visit: Payer: Self-pay

## 2018-03-02 ENCOUNTER — Encounter
Admission: RE | Disposition: A | Discharge status: Home / Self Care | Payer: Self-pay | Source: Ambulatory Visit | Attending: Surgery

## 2018-03-02 DIAGNOSIS — I110 Hypertensive heart disease with heart failure: Secondary | ICD-10-CM | POA: Diagnosis not present

## 2018-03-02 DIAGNOSIS — Z833 Family history of diabetes mellitus: Secondary | ICD-10-CM | POA: Insufficient documentation

## 2018-03-02 DIAGNOSIS — Z8249 Family history of ischemic heart disease and other diseases of the circulatory system: Secondary | ICD-10-CM | POA: Insufficient documentation

## 2018-03-02 DIAGNOSIS — Z79899 Other long term (current) drug therapy: Secondary | ICD-10-CM | POA: Insufficient documentation

## 2018-03-02 DIAGNOSIS — R229 Localized swelling, mass and lump, unspecified: Secondary | ICD-10-CM | POA: Diagnosis present

## 2018-03-02 DIAGNOSIS — F329 Major depressive disorder, single episode, unspecified: Secondary | ICD-10-CM | POA: Diagnosis not present

## 2018-03-02 DIAGNOSIS — I252 Old myocardial infarction: Secondary | ICD-10-CM | POA: Diagnosis not present

## 2018-03-02 DIAGNOSIS — J449 Chronic obstructive pulmonary disease, unspecified: Secondary | ICD-10-CM | POA: Diagnosis not present

## 2018-03-02 DIAGNOSIS — Z951 Presence of aortocoronary bypass graft: Secondary | ICD-10-CM | POA: Insufficient documentation

## 2018-03-02 DIAGNOSIS — M7989 Other specified soft tissue disorders: Secondary | ICD-10-CM | POA: Diagnosis not present

## 2018-03-02 DIAGNOSIS — D649 Anemia, unspecified: Secondary | ICD-10-CM | POA: Insufficient documentation

## 2018-03-02 DIAGNOSIS — R569 Unspecified convulsions: Secondary | ICD-10-CM | POA: Insufficient documentation

## 2018-03-02 DIAGNOSIS — Z810 Family history of intellectual disabilities: Secondary | ICD-10-CM | POA: Diagnosis not present

## 2018-03-02 DIAGNOSIS — I509 Heart failure, unspecified: Secondary | ICD-10-CM | POA: Diagnosis not present

## 2018-03-02 DIAGNOSIS — Z809 Family history of malignant neoplasm, unspecified: Secondary | ICD-10-CM | POA: Diagnosis not present

## 2018-03-02 DIAGNOSIS — K219 Gastro-esophageal reflux disease without esophagitis: Secondary | ICD-10-CM | POA: Diagnosis not present

## 2018-03-02 DIAGNOSIS — Z96698 Presence of other orthopedic joint implants: Secondary | ICD-10-CM | POA: Insufficient documentation

## 2018-03-02 DIAGNOSIS — I25118 Atherosclerotic heart disease of native coronary artery with other forms of angina pectoris: Secondary | ICD-10-CM | POA: Diagnosis not present

## 2018-03-02 DIAGNOSIS — M1990 Unspecified osteoarthritis, unspecified site: Secondary | ICD-10-CM | POA: Insufficient documentation

## 2018-03-02 DIAGNOSIS — Z955 Presence of coronary angioplasty implant and graft: Secondary | ICD-10-CM | POA: Insufficient documentation

## 2018-03-02 DIAGNOSIS — Z823 Family history of stroke: Secondary | ICD-10-CM | POA: Diagnosis not present

## 2018-03-02 DIAGNOSIS — E119 Type 2 diabetes mellitus without complications: Secondary | ICD-10-CM | POA: Diagnosis not present

## 2018-03-02 DIAGNOSIS — Z794 Long term (current) use of insulin: Secondary | ICD-10-CM | POA: Insufficient documentation

## 2018-03-02 DIAGNOSIS — Z886 Allergy status to analgesic agent status: Secondary | ICD-10-CM | POA: Insufficient documentation

## 2018-03-02 DIAGNOSIS — F419 Anxiety disorder, unspecified: Secondary | ICD-10-CM | POA: Insufficient documentation

## 2018-03-02 DIAGNOSIS — Z9660 Presence of unspecified orthopedic joint implant: Secondary | ICD-10-CM | POA: Insufficient documentation

## 2018-03-02 DIAGNOSIS — K5730 Diverticulosis of large intestine without perforation or abscess without bleeding: Secondary | ICD-10-CM | POA: Diagnosis not present

## 2018-03-02 HISTORY — PX: EXCISION MASS LOWER EXTREMETIES: SHX6705

## 2018-03-02 LAB — GLUCOSE, CAPILLARY
GLUCOSE-CAPILLARY: 95 mg/dL (ref 70–99)
Glucose-Capillary: 61 mg/dL — ABNORMAL LOW (ref 70–99)
Glucose-Capillary: 80 mg/dL (ref 70–99)

## 2018-03-02 SURGERY — EXCISION MASS LOWER EXTREMITIES
Anesthesia: General | Site: Leg Lower | Laterality: Right

## 2018-03-02 MED ORDER — HYDROCODONE-ACETAMINOPHEN 5-325 MG PO TABS: 1.0000 | ORAL_TABLET | ORAL | Status: DC | PRN

## 2018-03-02 MED ORDER — POTASSIUM CHLORIDE IN NACL 20-0.9 MEQ/L-% IV SOLN: INTRAVENOUS | Status: DC

## 2018-03-02 MED ORDER — ROCURONIUM BROMIDE 100 MG/10ML IV SOLN
INTRAVENOUS | Status: DC | PRN
  Administered 2018-03-02: 80 mg via INTRAVENOUS

## 2018-03-02 MED ORDER — HYDROCODONE-ACETAMINOPHEN 5-325 MG PO TABS: 1.0000 | ORAL_TABLET | Freq: Four times a day (QID) | ORAL | 0 refills | Status: AC | PRN

## 2018-03-02 MED ORDER — FAMOTIDINE 20 MG PO TABS
20.0000 mg | ORAL_TABLET | Freq: Once | ORAL | Status: AC
  Administered 2018-03-02: 20 mg via ORAL

## 2018-03-02 MED ORDER — LIDOCAINE HCL (PF) 2 % IJ SOLN
INTRAMUSCULAR | Status: AC
  Filled 2018-03-02: qty 10

## 2018-03-02 MED ORDER — FENTANYL CITRATE (PF) 100 MCG/2ML IJ SOLN
25.0000 ug | INTRAMUSCULAR | Status: DC | PRN
  Administered 2018-03-02: 25 ug via INTRAVENOUS

## 2018-03-02 MED ORDER — FENTANYL CITRATE (PF) 100 MCG/2ML IJ SOLN
INTRAMUSCULAR | Status: AC
  Administered 2018-03-02: 25 ug via INTRAVENOUS
  Filled 2018-03-02: qty 2

## 2018-03-02 MED ORDER — NEOMYCIN-POLYMYXIN B GU 40-200000 IR SOLN
Status: DC | PRN
  Administered 2018-03-02: 2 mL

## 2018-03-02 MED ORDER — SODIUM CHLORIDE 0.9 % IV SOLN
INTRAVENOUS | Status: DC
  Administered 2018-03-02: 12:00:00 via INTRAVENOUS

## 2018-03-02 MED ORDER — SUGAMMADEX SODIUM 200 MG/2ML IV SOLN
INTRAVENOUS | Status: DC | PRN
  Administered 2018-03-02: 200 mg via INTRAVENOUS

## 2018-03-02 MED ORDER — LIDOCAINE HCL (CARDIAC) PF 100 MG/5ML IV SOSY
PREFILLED_SYRINGE | INTRAVENOUS | Status: DC | PRN
  Administered 2018-03-02: 100 mg via INTRAVENOUS

## 2018-03-02 MED ORDER — FENTANYL CITRATE (PF) 100 MCG/2ML IJ SOLN
INTRAMUSCULAR | Status: DC | PRN
  Administered 2018-03-02 (×2): 50 ug via INTRAVENOUS

## 2018-03-02 MED ORDER — EPHEDRINE SULFATE 50 MG/ML IJ SOLN
INTRAMUSCULAR | Status: DC | PRN
  Administered 2018-03-02: 10 mg via INTRAVENOUS

## 2018-03-02 MED ORDER — PROPOFOL 10 MG/ML IV BOLUS
INTRAVENOUS | Status: DC | PRN
  Administered 2018-03-02: 180 mg via INTRAVENOUS

## 2018-03-02 MED ORDER — FAMOTIDINE 20 MG PO TABS
ORAL_TABLET | ORAL | Status: AC
  Filled 2018-03-02: qty 1

## 2018-03-02 MED ORDER — BUPIVACAINE HCL (PF) 0.5 % IJ SOLN
INTRAMUSCULAR | Status: DC | PRN
  Administered 2018-03-02: 10 mL

## 2018-03-02 MED ORDER — METOCLOPRAMIDE HCL 5 MG/ML IJ SOLN: 5.0000 mg | Freq: Three times a day (TID) | INTRAMUSCULAR | Status: DC | PRN

## 2018-03-02 MED ORDER — PROPOFOL 10 MG/ML IV BOLUS
INTRAVENOUS | Status: AC
  Filled 2018-03-02: qty 20

## 2018-03-02 MED ORDER — ONDANSETRON HCL 4 MG/2ML IJ SOLN: 4.0000 mg | Freq: Four times a day (QID) | INTRAMUSCULAR | Status: DC | PRN

## 2018-03-02 MED ORDER — ONDANSETRON HCL 4 MG/2ML IJ SOLN: 4.0000 mg | Freq: Once | INTRAMUSCULAR | Status: DC | PRN

## 2018-03-02 MED ORDER — FENTANYL CITRATE (PF) 100 MCG/2ML IJ SOLN
INTRAMUSCULAR | Status: AC
  Filled 2018-03-02: qty 2

## 2018-03-02 MED ORDER — ONDANSETRON HCL 4 MG PO TABS: 4.0000 mg | ORAL_TABLET | Freq: Four times a day (QID) | ORAL | Status: DC | PRN

## 2018-03-02 MED ORDER — METOCLOPRAMIDE HCL 10 MG PO TABS: 5.0000 mg | ORAL_TABLET | Freq: Three times a day (TID) | ORAL | Status: DC | PRN

## 2018-03-02 MED ORDER — LABETALOL HCL 5 MG/ML IV SOLN
INTRAVENOUS | Status: DC | PRN
  Administered 2018-03-02: 10 mg via INTRAVENOUS

## 2018-03-02 MED ORDER — ONDANSETRON HCL 4 MG/2ML IJ SOLN
INTRAMUSCULAR | Status: DC | PRN
  Administered 2018-03-02: 4 mg via INTRAVENOUS

## 2018-03-02 SURGICAL SUPPLY — 29 items
BANDAGE ELASTIC 4 LF NS (GAUZE/BANDAGES/DRESSINGS) ×3 IMPLANT
BNDG COHESIVE 4X5 TAN STRL (GAUZE/BANDAGES/DRESSINGS) ×3 IMPLANT
BNDG ESMARK 4X12 TAN STRL LF (GAUZE/BANDAGES/DRESSINGS) ×3 IMPLANT
CANISTER SUCT 1200ML W/VALVE (MISCELLANEOUS) ×3 IMPLANT
CHLORAPREP W/TINT 26ML (MISCELLANEOUS) ×3 IMPLANT
CORD BIP STRL DISP 12FT (MISCELLANEOUS) ×3 IMPLANT
COVER WAND RF STERILE (DRAPES) ×3 IMPLANT
CUFF TOURN 24 STER (MISCELLANEOUS) ×3 IMPLANT
DRAPE U-SHAPE 47X51 STRL (DRAPES) ×3 IMPLANT
ELECT REM PT RETURN 9FT ADLT (ELECTROSURGICAL) ×3
ELECTRODE REM PT RTRN 9FT ADLT (ELECTROSURGICAL) ×1 IMPLANT
GAUZE PETRO XEROFOAM 1X8 (MISCELLANEOUS) ×3 IMPLANT
GAUZE SPONGE 4X4 12PLY STRL (GAUZE/BANDAGES/DRESSINGS) ×3 IMPLANT
GLOVE BIO SURGEON STRL SZ8 (GLOVE) ×6 IMPLANT
GLOVE BIOGEL PI IND STRL 8 (GLOVE) ×1 IMPLANT
GLOVE BIOGEL PI INDICATOR 8 (GLOVE) ×2
GOWN STRL REUS W/ TWL LRG LVL3 (GOWN DISPOSABLE) ×2 IMPLANT
GOWN STRL REUS W/TWL LRG LVL3 (GOWN DISPOSABLE) ×4
KIT TURNOVER KIT A (KITS) ×3 IMPLANT
LABEL OR SOLS (LABEL) IMPLANT
NEEDLE FILTER BLUNT 18X 1/2SAF (NEEDLE) ×2
NEEDLE FILTER BLUNT 18X1 1/2 (NEEDLE) ×1 IMPLANT
NEEDLE HYPO 25X1 1.5 SAFETY (NEEDLE) ×3 IMPLANT
NS IRRIG 500ML POUR BTL (IV SOLUTION) ×3 IMPLANT
PACK EXTREMITY ARMC (MISCELLANEOUS) ×3 IMPLANT
PENCIL ELECTRO HAND CTR (MISCELLANEOUS) ×3 IMPLANT
STAPLER SKIN PROX 35W (STAPLE) ×3 IMPLANT
STOCKINETTE M/LG 89821 (MISCELLANEOUS) ×3 IMPLANT
SYR 10ML LL (SYRINGE) ×3 IMPLANT

## 2018-03-02 NOTE — Op Note (Signed)
03/02/2018  2:37 PM  Patient:   Tina Hayes  Pre-Op Diagnosis:   Soft tissue mass of medial aspect, right ankle.  Post-Op Diagnosis:   Same  Procedure:   Excision of soft tissue mass of medial aspect, right ankle.  Surgeon:   Pascal Lux, MD  Assistant:   Phoebe Sharps, PA-S  Anesthesia:   GET  Findings:   As above.  Complications:   None  Fluids:   800 cc crystalloid  EBL:   5 cc  UOP:   None  TT:   29 minutes at 250 mmHg  Drains:   None  Closure:   Staples  Brief Clinical Note:   The patient is a 72 year old female with a 8 to 71-month history of a tender soft tissue mass on the medial aspect of her right lower leg just above the ankle. An ultrasound was inconclusive for diagnosis, so she underwent an MRI scan which demonstrated a multilobulated benign-appearing soft tissue mass. She presents at this time for an excisional biopsy of the mass.  Procedure:   The patient was brought into the operating room and lain in the supine position. After adequate general endotracheal intubation and anesthesia were obtained, the patient's right lower extremity and foot were prepped with ChloraPrep solution before being draped sterilely. Preoperative antibiotics were administered. A timeout was performed to verify the appropriate surgical site before the limb was exsanguinated and a calf tourniquet inflated to 250 mmHg. An approximately 3.5-4 cm incision was made longitudinally directly over the mass. The incision was carried down through the subdermal tissues to expose the mass. The mass was dissected out circumferentially using blunt dissection.  Hemostasis was achieved using bipolar electrocautery. A small amount of fluid was encountered, consistent with a cyst. However, there was moderate gelatinous looking material noted as well. The mass was removed in its entirety and sent to pathology for further evaluation and identification.  The wound was copiously irrigated with bacitracin  saline solution before the subcutaneous tissues were closed using 3-0 Vicryl interrupted sutures. The skin was closed using staples. A total of 10 cc of 0.5% plain Sensorcaine was injected in around the incision to help with postoperative analgesia. A sterile bulky compressive dressing was applied to the wound before the patient was awakened, extubated, and returned to the recovery room in satisfactory condition after tolerating the procedure well.

## 2018-03-02 NOTE — Anesthesia Preprocedure Evaluation (Signed)
Anesthesia Evaluation  Patient identified by MRN, date of birth, ID band Patient awake    Reviewed: Allergy & Precautions, H&P , NPO status , Patient's Chart, lab work & pertinent test results, reviewed documented beta blocker date and time   Airway Mallampati: II  TM Distance: <3 FB Neck ROM: full    Dental  (+) Edentulous Upper, Dental Advidsory Given, Poor Dentition, Missing   Pulmonary shortness of breath and with exertion, asthma , neg sleep apnea, COPD,  COPD inhaler, neg recent URI, former smoker,           Cardiovascular Exercise Tolerance: Good hypertension, + angina (stable angina) + CAD, + Past MI, + Cardiac Stents, + CABG and +CHF  (-) dysrhythmias (-) Valvular Problems/Murmurs     Neuro/Psych Seizures -,  PSYCHIATRIC DISORDERS Anxiety Depression    GI/Hepatic Neg liver ROS, GERD  ,  Endo/Other  diabetes, Well Controlled, Type 2, Oral Hypoglycemic Agents, Insulin DependentMorbid obesity  Renal/GU negative Renal ROS  negative genitourinary   Musculoskeletal  (+) Arthritis , Osteoarthritis,    Abdominal   Peds  Hematology negative hematology ROS (+)   Anesthesia Other Findings Past Medical History: No date: (HFpEF) heart failure with preserved ejection fraction ([place])     Comment:  a. 05/2016 Echo: EF 60-65%, mild to mod LVH, Gr1 DD, mild              MR, mildly dil LA, mod TR, mildly to mod increased PASP. No date: Anxiety No date: CHF (congestive heart failure) (HCC) No date: Chronic back pain No date: COPD (chronic obstructive pulmonary disease) (HCC) No date: Coronary artery disease     Comment:  a. s/p remote PCI x 5;  b. 2006 s/p CABG x 3               ([place], [place]); b. 05/2016               MV: attenuation corrected images w/o ischemia or               wma-->Med rx. No date: Depression No date: Diabetes mellitus without complication (HCC) No date: Heart attack [facility])   Comment:  Total of 3 per pt. No date: Hypertension No date: Seizures (HCC)   Reproductive/Obstetrics negative OB ROS                             Anesthesia Physical  Anesthesia Plan  ASA: III  Anesthesia Plan: General   Post-op Pain Management:    Induction: Intravenous  PONV Risk Score and Plan: 3 and Propofol infusion  Airway Management Planned: Oral ETT  Additional Equipment:   Intra-op Plan:   Post-operative Plan: Extubation in OR  Informed Consent: I have reviewed the patients History and Physical, chart, labs and discussed the procedure including the risks, benefits and alternatives for the proposed anesthesia with the patient or authorized representative who has indicated his/her understanding and acceptance.   Dental Advisory Given  Plan Discussed with: Anesthesiologist, CRNA and Surgeon  Anesthesia Plan Comments:         Anesthesia Quick Evaluation

## 2018-03-02 NOTE — Discharge Instructions (Addendum)
Orthopedic discharge instructions: Keep dressing dry and intact.  May shower after dressing changed on post-op day #4 (Saturday).  Cover staples/sutures with Band-Aids after drying off. Apply ice frequently to lower leg. Take pain medication as prescribed or ES Tylenol when needed.  May weight-bear as tolerated - use crutches or walker as needed. Follow-up in 10-14 days or as scheduled.  AMBULATORY SURGERY  DISCHARGE INSTRUCTIONS   1) The drugs that you were given will stay in your system until tomorrow so for the next 24 hours you should not:  A) Drive an automobile B) Make any legal decisions C) Drink any alcoholic beverage   2) You may resume regular meals tomorrow.  Today it is better to start with liquids and gradually work up to solid foods.  You may eat anything you prefer, but it is better to start with liquids, then soup and crackers, and gradually work up to solid foods.   3) Please notify your doctor immediately if you have any unusual bleeding, trouble breathing, redness and pain at the surgery site, drainage, fever, or pain not relieved by medication.    4) Additional Instructions:        Please contact your physician with any problems or Same Day Surgery at [phone], Monday through Friday 6 am to 4 pm, or [place] at [facility] number at [phone].

## 2018-03-02 NOTE — Anesthesia Procedure Notes (Signed)
Procedure Name: Intubation Date/Time: 03/02/2018 1:40 PM Performed by: Bernardo Heater, CRNA Pre-anesthesia Checklist: Patient identified, Emergency Drugs available, Suction available and Patient being monitored Patient Re-evaluated:Patient Re-evaluated prior to induction Oxygen Delivery Method: Circle system utilized Preoxygenation: Pre-oxygenation with 100% oxygen Induction Type: IV induction Laryngoscope Size: Mac and 3 Grade View: Grade I Tube size: 7.0 mm Number of attempts: 1 Placement Confirmation: ETT inserted through vocal cords under direct vision,  positive ETCO2 and breath sounds checked- equal and bilateral Secured at: 21 cm Tube secured with: Tape Dental Injury: Teeth and Oropharynx as per pre-operative assessment

## 2018-03-02 NOTE — Transfer of Care (Signed)
Immediate Anesthesia Transfer of Care Note  Patient: Tina Hayes  Procedure(s) Performed: EXCISION SOFT TISSUE MASS FROM MEDIAL ASPECT OF RIGHT ANKLE (Right Leg Lower)  Patient Location: PACU  Anesthesia Type:General  Level of Consciousness: awake and patient cooperative  Airway & Oxygen Therapy: Patient Spontanous Breathing and Patient connected to nasal cannula oxygen  Post-op Assessment: Report given to RN and Post -op Vital signs reviewed and stable  Post vital signs: Reviewed and stable  Last Vitals:  Vitals Value Taken Time  BP 115/70 03/02/2018  2:39 PM  Temp 35.7 C 03/02/2018  2:39 PM  Pulse 58 03/02/2018  2:39 PM  Resp 15 03/02/2018  2:39 PM  SpO2 94 % 03/02/2018  2:39 PM  Vitals shown include unvalidated device data.  Last Pain:  Vitals:   03/02/18 1120  TempSrc: Temporal  PainSc: 8          Complications: No apparent anesthesia complications

## 2018-03-02 NOTE — H&P (Signed)
Paper H&P to be scanned into permanent record. H&P reviewed and patient re-examined. No changes. 

## 2018-03-02 NOTE — Anesthesia Post-op Follow-up Note (Signed)
Anesthesia QCDR form completed.        

## 2018-03-03 ENCOUNTER — Encounter: Payer: Self-pay | Admitting: Surgery

## 2018-03-04 NOTE — Anesthesia Postprocedure Evaluation (Signed)
Anesthesia Post Note  Patient: Tina Hayes  Procedure(s) Performed: EXCISION SOFT TISSUE MASS FROM MEDIAL ASPECT OF RIGHT ANKLE (Right Leg Lower)  Patient location during evaluation: PACU Anesthesia Type: General Level of consciousness: awake and alert and oriented Pain management: pain level controlled Vital Signs Assessment: post-procedure vital signs reviewed and stable Respiratory status: spontaneous breathing Cardiovascular status: blood pressure returned to baseline Anesthetic complications: no     Last Vitals:  Vitals:   03/02/18 1537 03/02/18 1605  BP: 133/86 101/87  Pulse: (!) 57 (!) 51  Resp: 18 18  Temp: 36.5 C 36.5 C  SpO2: 99% 99%    Last Pain:  Vitals:   03/02/18 1605  TempSrc: Temporal  PainSc: 0-No pain                 Akili Corsetti

## 2018-03-08 LAB — SURGICAL PATHOLOGY

## 2018-03-17 ENCOUNTER — Telehealth: Payer: Self-pay | Admitting: Anesthesiology

## 2018-03-17 NOTE — Telephone Encounter (Signed)
Patient is out of meds please call patient to discuss. She is due to fill on the 24th. She had surgery and Dr. Roland Rack gave her script for post surgery pain but pharmacy would not fill because it was the same. She had to increase on days pain was really bad and that is why she is out. Please call patient and let her know what she can do.

## 2018-03-17 NOTE — Telephone Encounter (Signed)
Called patient to find out what was going on. States she had surgery 2 weeks ago on her ankle and that Dr Roland Rack gave her a script for the samd medication that Dr Andree Elk prescribes and pharmacy would not fill it. Ms Vandenboom states she had to use some medication that she had and will run out before its time to be filled. Instructed her on the pain policy with understanding. Instructed pt to call Dr Roland Rack office and explain to them.  "I think I will be OK until I am able to get it filled. Instructed patient to call us back if needed.

## 2018-04-05 ENCOUNTER — Ambulatory Visit (HOSPITAL_BASED_OUTPATIENT_CLINIC_OR_DEPARTMENT_OTHER): Payer: Medicare HMO | Admitting: Anesthesiology

## 2018-04-05 ENCOUNTER — Other Ambulatory Visit: Payer: Self-pay

## 2018-04-05 ENCOUNTER — Ambulatory Visit
Admission: RE | Admit: 2018-04-05 | Discharge: 2018-04-05 | Disposition: A | Discharge status: Home / Self Care | Payer: Medicare HMO | Source: Ambulatory Visit | Attending: Anesthesiology | Admitting: Anesthesiology

## 2018-04-05 ENCOUNTER — Encounter: Payer: Self-pay | Admitting: Anesthesiology

## 2018-04-05 ENCOUNTER — Other Ambulatory Visit: Payer: Self-pay | Admitting: Anesthesiology

## 2018-04-05 DIAGNOSIS — M5432 Sciatica, left side: Secondary | ICD-10-CM | POA: Insufficient documentation

## 2018-04-05 DIAGNOSIS — M5136 Other intervertebral disc degeneration, lumbar region: Secondary | ICD-10-CM | POA: Diagnosis not present

## 2018-04-05 DIAGNOSIS — M4716 Other spondylosis with myelopathy, lumbar region: Secondary | ICD-10-CM | POA: Insufficient documentation

## 2018-04-05 DIAGNOSIS — M5431 Sciatica, right side: Secondary | ICD-10-CM | POA: Insufficient documentation

## 2018-04-05 DIAGNOSIS — M48062 Spinal stenosis, lumbar region with neurogenic claudication: Secondary | ICD-10-CM | POA: Insufficient documentation

## 2018-04-05 DIAGNOSIS — Z79899 Other long term (current) drug therapy: Secondary | ICD-10-CM | POA: Diagnosis not present

## 2018-04-05 DIAGNOSIS — Z794 Long term (current) use of insulin: Secondary | ICD-10-CM | POA: Insufficient documentation

## 2018-04-05 DIAGNOSIS — R52 Pain, unspecified: Secondary | ICD-10-CM

## 2018-04-05 DIAGNOSIS — Z7902 Long term (current) use of antithrombotics/antiplatelets: Secondary | ICD-10-CM | POA: Insufficient documentation

## 2018-04-05 MED ORDER — IOPAMIDOL (ISOVUE-M 200) INJECTION 41%
INTRAMUSCULAR | Status: AC
  Filled 2018-04-05: qty 10

## 2018-04-05 MED ORDER — MIDAZOLAM HCL 5 MG/5ML IJ SOLN
INTRAMUSCULAR | Status: AC
  Filled 2018-04-05: qty 5

## 2018-04-05 MED ORDER — LIDOCAINE HCL (PF) 1 % IJ SOLN
5.0000 mL | Freq: Once | INTRAMUSCULAR | Status: AC
  Administered 2018-04-05: 5 mL via SUBCUTANEOUS

## 2018-04-05 MED ORDER — LIDOCAINE HCL (PF) 1 % IJ SOLN
INTRAMUSCULAR | Status: AC
  Filled 2018-04-05: qty 5

## 2018-04-05 MED ORDER — MIDAZOLAM HCL 5 MG/5ML IJ SOLN
5.0000 mg | Freq: Once | INTRAMUSCULAR | Status: AC
  Administered 2018-04-05: 3 mg via INTRAVENOUS

## 2018-04-05 MED ORDER — IOPAMIDOL (ISOVUE-M 200) INJECTION 41%
20.0000 mL | Freq: Once | INTRAMUSCULAR | Status: DC | PRN
  Administered 2018-04-05: 10 mL
  Filled 2018-04-05: qty 20

## 2018-04-05 MED ORDER — HYDROCODONE-ACETAMINOPHEN 5-325 MG PO TABS: 1.0000 | ORAL_TABLET | Freq: Four times a day (QID) | ORAL | 0 refills | Status: DC | PRN

## 2018-04-05 MED ORDER — ROPIVACAINE HCL 2 MG/ML IJ SOLN
INTRAMUSCULAR | Status: AC
  Filled 2018-04-05: qty 10

## 2018-04-05 MED ORDER — SODIUM CHLORIDE 0.9% FLUSH
10.0000 mL | Freq: Once | INTRAVENOUS | Status: AC
  Administered 2018-04-05: 10 mL

## 2018-04-05 MED ORDER — SODIUM CHLORIDE (PF) 0.9 % IJ SOLN
INTRAMUSCULAR | Status: AC
  Filled 2018-04-05: qty 10

## 2018-04-05 MED ORDER — LACTATED RINGERS IV SOLN
1000.0000 mL | INTRAVENOUS | Status: DC
  Administered 2018-04-05: 1000 mL via INTRAVENOUS

## 2018-04-05 MED ORDER — TRIAMCINOLONE ACETONIDE 40 MG/ML IJ SUSP
INTRAMUSCULAR | Status: AC
  Filled 2018-04-05: qty 1

## 2018-04-05 MED ORDER — TRIAMCINOLONE ACETONIDE 40 MG/ML IJ SUSP
40.0000 mg | Freq: Once | INTRAMUSCULAR | Status: AC
  Administered 2018-04-05: 40 mg

## 2018-04-05 MED ORDER — ROPIVACAINE HCL 2 MG/ML IJ SOLN
10.0000 mL | Freq: Once | INTRAMUSCULAR | Status: AC
  Administered 2018-04-05: 10 mL via EPIDURAL

## 2018-04-05 NOTE — Patient Instructions (Addendum)
You have been given Hydrocodone x 2    Post-procedure Information What to expect: Most procedures involve the use of a local anesthetic (numbing medicine), and a steroid (anti-inflammatory medicine).  The local anesthetics may cause temporary numbness and weakness of the legs or arms, depending on the location of the block. This numbness/weakness may last 4-6 hours, depending on the local anesthetic used. In rare instances, it can last up to 24 hours. While numb, you must be very careful not to injure the extremity.  After any procedure, you could expect the pain to get better within 15-20 minutes. This relief is temporary and may last 4-6 hours. Once the local anesthetics wears off, you could experience discomfort, possibly more than usual, for up to 10 (ten) days. In the case of radiofrequencies, it may last up to 6 weeks. Surgeries may take up to 8 weeks for the healing process. The discomfort is due to the irritation caused by needles going through skin and muscle. To minimize the discomfort, we recommend using ice the first day, and heat from then on. The ice should be applied for 15 minutes on, and 15 minutes off. Keep repeating this cycle until bedtime. Avoid applying the ice directly to the skin, to prevent frostbite. Heat should be used daily, until the pain improves (4-10 days). Be careful not to burn yourself.  Occasionally you may experience muscle spasms or cramps. These occur as a consequence of the irritation caused by the needle sticks to the muscle and the blood that will inevitably be lost into the surrounding muscle tissue. Blood tends to be very irritating to tissues, which tend to react by going into spasm. These spasms may start the same day of your procedure, but they may also take days to develop. This late onset type of spasm or cramp is usually caused by electrolyte imbalances triggered by the steroids, at the level of the kidney. Cramps and spasms tend to respond well to muscle  relaxants, multivitamins (some are triggered by the procedure, but may have their origins in vitamin deficiencies), and "Gatorade", or any sports drinks that can replenish any electrolyte imbalances. (If you are a diabetic, ask your pharmacist to get you a sugar-free brand.) Warm showers or baths may also be helpful. Stretching exercises are highly recommended. General Instructions:  Be alert for signs of possible infection: redness, swelling, heat, red streaks, elevated temperature, and/or fever. These typically appear 4 to 6 days after the procedure. Immediately notify your doctor if you experience unusual bleeding, difficulty breathing, or loss of bowel or bladder control. If you experience increased pain, do not increase your pain medicine intake, unless instructed by your pain physician. Post-Procedure Care:  Be careful in moving about. Muscle spasms in the area of the injection may occur. Applying ice or heat to the area is often helpful. The incidence of spinal headaches after epidural injections ranges between 1.4% and 6%. If you develop a headache that does not seem to respond to conservative therapy, please let your physician know. This can be treated with an epidural blood patch.   Post-procedure numbness or redness is to be expected, however it should average 4 to 6 hours. If numbness and weakness of your extremities begins to develop 4 to 6 hours after your procedure, and is felt to be progressing and worsening, immediately contact your physician.   Diet:  If you experience nausea, do not eat until this sensation goes away. If you had a "Stellate Ganglion Block" for upper extremity "  Reflex Sympathetic Dystrophy", do not eat or drink until your hoarseness goes away. In any case, always start with liquids first and if you tolerate them well, then slowly progress to more solid foods. Activity:  For the first 4 to 6 hours after the procedure, use caution in moving about as you may experience  numbness and/or weakness. Use caution in cooking, using household electrical appliances, and climbing steps. If you need to reach your Doctor call our office: [phone]) [phone] Monday-Thursday 8:00 am - 4:00 PM    Fridays: Closed     In case of an emergency: In case of emergency, call 911 or go to the nearest emergency room and have the physician there call us.  Interpretation of Procedure Every nerve block has two components: a diagnostic component, and a treatment component. Unrealistic expectations are the most common causes of "perceived failure".  In a perfect world, a single nerve block should be able to completely and permanently eliminate the pain. Sadly, the world is not perfect.  Most pain management nerve blocks are performed using local anesthetics and steroids. Steroids are responsible for any long-term benefit that you may experience. Their purpose is to decrease any chronic swelling that may exist in the area. Steroids begin to work immediately after being injected. However, most patients will not experience any benefits until 5 to 10 days after the injection, when the swelling has come down to the point where they can tell a difference. Steroids will only help if there is swelling to be treated. As such, they can assist with the diagnosis. If effective, they suggest an inflammatory component to the pain, and if ineffective, they rule out inflammation as the main cause or component of the problem. If the problem is one of mechanical compression, you will get no benefit from those steroids.   In the case of local anesthetics, they have a crucial role in the diagnosis of your condition. Most will begin to work within15 to 20 minutes after injection. The duration will depend on the type used (short- vs. Long-acting). It is of outmost importance that patients keep tract of their pain, after the procedure. To assist with this matter, a "Post-procedure Pain Diary" is provided. Make sure to complete  it and to bring it back to your follow-up appointment.  As long as the patient keeps accurate, detailed records of their symptoms after every procedure, and returns to have those interpreted, every procedure will provide [place] with invaluable information. Even a block that does not provide the patient with any relief, will always provide [place] with information about the mechanism and the origin of the pain. The only time a nerve block can be considered a waste of time is when patients do not keep track of the results, or do not keep their post-procedure appointment.  Reporting the results back to your physician The Pain Score  Pain is a subjective complaint. It cannot be seen, touched, or measured. We depend entirely on the patient's report of the pain in order to assess your condition and treatment. To evaluate the pain, we use a pain scale, where "0" means "No Pain", and a "10" is "the worst possible pain that you can even imagine" (i.e. something like been eaten alive by a shark or being torn apart by a lion).   You will frequently be asked to rate your pain. Please be as accurate, remember that medical decisions will be based on your responses. Please do not rate your pain above a 10.  Doing so is actually interpreted as "symptom magnification" (exaggeration), as well as lack of understanding with regards to the scale. To put this into perspective, when you tell us that your pain is at a 10 (ten), what you are saying is that there is nothing we can do to make this pain any worse. (Carefully think about that.)

## 2018-04-05 NOTE — Progress Notes (Signed)
Safety precautions to be maintained throughout the outpatient stay will include: orient to surroundings, keep bed in low position, maintain call bell within reach at all times, provide assistance with transfer out of bed and ambulation.  

## 2018-04-06 NOTE — Progress Notes (Signed)
Subjective:  Patient ID: Tina Hayes, female    DOB: 1946-01-09  Age: 72 y.o. MRN: [id number]  CC: Procedure (lumbar epidural )   Procedure: L5-S1 lumbar epidural steroid under fluoroscopic guidance  HPI Tina Hayes presents for reevaluation.  She presents for reevaluation last seen a few months ago.  She is continuing to have recurrent low back pain.  Her previous injection back in the fall gave her approximately 2 months of relief rated at 7075%.  The quality characteristic distribution the pain is otherwise unchanged.  Unfortunately she has had recurrent pain that is failed conservative therapy and continues to require intermittent epidural injections and hydrocodone twice a day.  Based on her narcotic assessment sheet she continues to derive good functional lifestyle benefits with the medication.  Previous epidural injections have helped keep her pain under control and allowed her to maintain her activity and sleep better at night.  Otherwise she is in her usual state of health at this point.  Outpatient Medications Prior to Visit  Medication Sig Dispense Refill  . albuterol (PROVENTIL HFA;VENTOLIN HFA) 108 (90 Base) MCG/ACT inhaler Inhale 2 puffs into the lungs every 6 (six) hours as needed for wheezing or shortness of breath. 1 Inhaler 2  . albuterol (PROVENTIL) (2.5 MG/3ML) 0.083% nebulizer solution Take 2.5 mg every 4 (four) hours as needed by nebulization for wheezing or shortness of breath.    [name] amLODipine (NORVASC) 10 MG tablet Take 10 mg by mouth daily.    [name] atorvastatin (LIPITOR) 40 MG tablet Take 40 mg at bedtime by mouth.     . Calcium Carbonate-Vitamin D3 (CALCIUM 600-D) 600-400 MG-UNIT TABS Take 1 tablet 2 (two) times daily by mouth.    . carvedilol (COREG) 3.125 MG tablet Take 1 tablet (3.125 mg total) by mouth 2 (two) times daily with a meal. 180 tablet 3  . cetirizine (ZYRTEC) 10 MG tablet Take 10 mg by mouth daily.    . citalopram (CELEXA) 20 MG tablet Take 20 mg by  mouth at bedtime.     . insulin aspart (NOVOLOG) 100 UNIT/ML injection Inject 5-10 Units into the skin See admin instructions. Take 10 units SQ in the morning, inject 5 units SQ in the afternoon and 5 units SQ in the evening with meal    . insulin glargine (LANTUS) 100 UNIT/ML injection Inject 30-40 Units into the skin See admin instructions. Inject 30 units in the morning at 40 units at bedtime.    . isosorbide mononitrate (IMDUR) 30 MG 24 hr tablet Take 1 tablet (30 mg total) by mouth 2 (two) times daily. 180 tablet 3  . levETIRAcetam (KEPPRA) 1000 MG tablet Take 1 tablet (1,000 mg total) by mouth 2 (two) times daily. 60 tablet 0  . losartan (COZAAR) 100 MG tablet Take 100 mg by mouth daily.    . montelukast (SINGULAIR) 10 MG tablet Take 10 mg by mouth daily.     . nitroGLYCERIN (NITROSTAT) 0.4 MG SL tablet DISSOLVE ONE TABLET UNDER THE TONGUE EVERY 5 MINUTES AS NEEDED FOR CHEST PAIN.  DO NOT EXCEED A TOTAL OF 3 DOSES IN 15 MINUTES (Patient taking differently: Place 0.4 mg under the tongue every 5 (five) minutes as needed for chest pain. ) 25 tablet 0  . potassium chloride SA (K-DUR,KLOR-CON) 20 MEQ tablet Take 2 tablets (40 mEq total) by mouth 3 (three) times daily. Take extra 2 tablets (40 meq) if you take metolazone (Patient taking differently: Take 40 mEq by mouth 2 (two)  times daily. Take extra 2 tablets (40 meq) if you take metolazone) 120 tablet 3  . ranitidine (ZANTAC) 300 MG tablet Take 300 mg by mouth at bedtime.    [name] tiotropium (SPIRIVA) 18 MCG inhalation capsule Place 18 mcg into inhaler and inhale daily.    . traZODone (DESYREL) 50 MG tablet Take 50 mg by mouth at bedtime.    . clopidogrel (PLAVIX) 75 MG tablet Take 1 tablet (75 mg total) by mouth daily. (Patient not taking: Reported on 04/05/2018) 30 tablet 0  . torsemide (DEMADEX) 20 MG tablet Take 2 tablets (40 mg total) by mouth 2 (two) times daily. (Patient taking differently: Take 60 mg by mouth 2 (two) times daily. ) 180 tablet 0    No facility-administered medications prior to visit.     Review of Systems CNS: No confusion or sedation Cardiac: No angina or palpitations GI: No abdominal pain or constipation Constitutional: No nausea vomiting fevers or chills  Objective:  BP 119/74   Pulse 65   Temp 98.2 F (36.8 C)   Resp 16   Ht 5\' 11"  (1.803 m)   Wt 299 lb (135.6 kg)   SpO2 100%   BMI 41.70 kg/m    BP Readings from Last 3 Encounters:  04/05/18 119/74  03/02/18 101/87  02/26/18 130/80     Wt Readings from Last 3 Encounters:  04/05/18 299 lb (135.6 kg)  02/26/18 (!) 303 lb 4.8 oz (137.6 kg)  02/18/18 (!) 303 lb (137.4 kg)     Physical Exam Pt is alert and oriented PERRL EOMI HEART IS RRR no murmur or rub LCTA no wheezing or rales MUSCULOSKELETAL reveals some paraspinous muscle tenderness but no overt trigger points.  She does have a positive straight leg raise bilaterally worse on the left.  Her muscle tone and bulk is at baseline  Labs  Lab Results  Component Value Date   HGBA1C 7.7 (H) 08/16/2017   HGBA1C 8.5 (H) 01/29/2017   HGBA1C 8.5 (H) 06/17/2016   Lab Results  Component Value Date   LDLCALC 58 01/30/2017   CREATININE 0.97 02/26/2018    -------------------------------------------------------------------------------------------------------------------- Lab Results  Component Value Date   WBC 8.1 02/26/2018   HGB 10.6 (L) 02/26/2018   HCT 34.4 (L) 02/26/2018   PLT 314 02/26/2018   GLUCOSE 95 02/26/2018   CHOL 120 01/30/2017   TRIG 73 01/30/2017   HDL 47 01/30/2017   LDLCALC 58 01/30/2017   ALT 14 11/10/2017   AST 20 11/10/2017   NA 141 02/26/2018   K 3.7 02/26/2018   CL 105 02/26/2018   CREATININE 0.97 02/26/2018   BUN 13 02/26/2018   CO2 27 02/26/2018   INR 0.99 11/13/2016   HGBA1C 7.7 (H) 08/16/2017    --------------------------------------------------------------------------------------------------------------------- Dg C-arm 1-60 Min-no  Report  Result Date: 04/05/2018 Fluoroscopy was utilized by the requesting physician.  No radiographic interpretation.     Assessment & Plan:   Tina Hayes was seen today for procedure.  Diagnoses and all orders for this visit:  Bilateral sciatica -     Lumbar Epidural Injection  DDD (degenerative disc disease), lumbar -     Lumbar Epidural Injection  Spinal stenosis of lumbar region with neurogenic claudication -     Lumbar Epidural Injection  Lumbar spondylosis with myelopathy -     Lumbar Epidural Injection  Other orders -     Discontinue: HYDROcodone-acetaminophen (NORCO/VICODIN) 5-325 MG tablet; Take 1 tablet by mouth every 6 (six) hours as needed for moderate pain  or severe pain. -     HYDROcodone-acetaminophen (NORCO/VICODIN) 5-325 MG tablet; Take 1 tablet by mouth every 6 (six) hours as needed for moderate pain or severe pain. -     triamcinolone acetonide (KENALOG-40) injection 40 mg -     sodium chloride flush (NS) 0.9 % injection 10 mL -     ropivacaine (PF) 2 mg/mL (0.2%) (NAROPIN) injection 10 mL -     midazolam (VERSED) 5 MG/5ML injection 5 mg -     lidocaine (PF) (XYLOCAINE) 1 % injection 5 mL -     lactated ringers infusion 1,000 mL -     iopamidol (ISOVUE-M) 41 % intrathecal injection 20 mL        ----------------------------------------------------------------------------------------------------------------------  Problem List Items Addressed This Visit    None    Visit Diagnoses    Bilateral sciatica       Relevant Medications   midazolam (VERSED) 5 MG/5ML injection 5 mg (Completed)   DDD (degenerative disc disease), lumbar       Relevant Medications   HYDROcodone-acetaminophen (NORCO/VICODIN) 5-325 MG tablet   triamcinolone acetonide (KENALOG-40) injection 40 mg (Completed)   Spinal stenosis of lumbar region with neurogenic claudication       Relevant Medications   HYDROcodone-acetaminophen (NORCO/VICODIN) 5-325 MG tablet   triamcinolone acetonide  (KENALOG-40) injection 40 mg (Completed)   Lumbar spondylosis with myelopathy            ----------------------------------------------------------------------------------------------------------------------  1. Bilateral sciatica We will proceed with a repeat epidural injection today.  She receives these periodically for her intractable low back pain.  We have gone over the risks and benefits of the procedure once again in full detail all questions answered.  I want her to continue efforts at weight loss stretching strengthening as previously reviewed.  She is return to clinic approximately 2 months. - Lumbar Epidural Injection  2. DDD (degenerative disc disease), lumbar As above - Lumbar Epidural Injection  3. Spinal stenosis of lumbar region with neurogenic claudication As above.  We will keep her on her current medication regimen with refills given for December 24 and January 23 of next year for the Vicodin 5 mg twice daily. - Lumbar Epidural Injection  4. Lumbar spondylosis with myelopathy As above - Lumbar Epidural Injection    ----------------------------------------------------------------------------------------------------------------------  I am having Tina Hayes. Reihl maintain her tiotropium, traZODone, albuterol, citalopram, insulin aspart, carvedilol, isosorbide mononitrate, clopidogrel, atorvastatin, insulin glargine, Calcium Carbonate-Vitamin D3, albuterol, nitroGLYCERIN, montelukast, levETIRAcetam, amLODipine, potassium chloride SA, torsemide, losartan, ranitidine, cetirizine, and HYDROcodone-acetaminophen. We administered triamcinolone acetonide, sodium chloride flush, ropivacaine (PF) 2 mg/mL (0.2%), midazolam, lidocaine (PF), lactated ringers, and iopamidol.   Meds ordered this encounter  Medications  . DISCONTD: HYDROcodone-acetaminophen (NORCO/VICODIN) 5-325 MG tablet    Sig: Take 1 tablet by mouth every 6 (six) hours as needed for moderate pain or severe pain.     Dispense:  90 tablet    Refill:  0    Do not fill until [id number]  . HYDROcodone-acetaminophen (NORCO/VICODIN) 5-325 MG tablet    Sig: Take 1 tablet by mouth every 6 (six) hours as needed for moderate pain or severe pain.    Dispense:  90 tablet    Refill:  0    Do not fill until [id number]  . triamcinolone acetonide (KENALOG-40) injection 40 mg  . sodium chloride flush (NS) 0.9 % injection 10 mL  . ropivacaine (PF) 2 mg/mL (0.2%) (NAROPIN) injection 10 mL  . midazolam (VERSED) 5 MG/5ML injection 5 mg  .  lidocaine (PF) (XYLOCAINE) 1 % injection 5 mL  . lactated ringers infusion 1,000 mL  . iopamidol (ISOVUE-M) 41 % intrathecal injection 20 mL   Patient's Medications  New Prescriptions   HYDROCODONE-ACETAMINOPHEN (NORCO/VICODIN) 5-325 MG TABLET    Take 1 tablet by mouth every 6 (six) hours as needed for moderate pain or severe pain.  Previous Medications   ALBUTEROL (PROVENTIL HFA;VENTOLIN HFA) 108 (90 BASE) MCG/ACT INHALER    Inhale 2 puffs into the lungs every 6 (six) hours as needed for wheezing or shortness of breath.   ALBUTEROL (PROVENTIL) (2.5 MG/3ML) 0.083% NEBULIZER SOLUTION    Take 2.5 mg every 4 (four) hours as needed by nebulization for wheezing or shortness of breath.   AMLODIPINE (NORVASC) 10 MG TABLET    Take 10 mg by mouth daily.   ATORVASTATIN (LIPITOR) 40 MG TABLET    Take 40 mg at bedtime by mouth.    CALCIUM CARBONATE-VITAMIN D3 (CALCIUM 600-D) 600-400 MG-UNIT TABS    Take 1 tablet 2 (two) times daily by mouth.   CARVEDILOL (COREG) 3.125 MG TABLET    Take 1 tablet (3.125 mg total) by mouth 2 (two) times daily with a meal.   CETIRIZINE (ZYRTEC) 10 MG TABLET    Take 10 mg by mouth daily.   CITALOPRAM (CELEXA) 20 MG TABLET    Take 20 mg by mouth at bedtime.    CLOPIDOGREL (PLAVIX) 75 MG TABLET    Take 1 tablet (75 mg total) by mouth daily.   INSULIN ASPART (NOVOLOG) 100 UNIT/ML INJECTION    Inject 5-10 Units into the skin See admin instructions. Take 10 units SQ in the  morning, inject 5 units SQ in the afternoon and 5 units SQ in the evening with meal   INSULIN GLARGINE (LANTUS) 100 UNIT/ML INJECTION    Inject 30-40 Units into the skin See admin instructions. Inject 30 units in the morning at 40 units at bedtime.   ISOSORBIDE MONONITRATE (IMDUR) 30 MG 24 HR TABLET    Take 1 tablet (30 mg total) by mouth 2 (two) times daily.   LEVETIRACETAM (KEPPRA) 1000 MG TABLET    Take 1 tablet (1,000 mg total) by mouth 2 (two) times daily.   LOSARTAN (COZAAR) 100 MG TABLET    Take 100 mg by mouth daily.   MONTELUKAST (SINGULAIR) 10 MG TABLET    Take 10 mg by mouth daily.    NITROGLYCERIN (NITROSTAT) 0.4 MG SL TABLET    DISSOLVE ONE TABLET UNDER THE TONGUE EVERY 5 MINUTES AS NEEDED FOR CHEST PAIN.  DO NOT EXCEED A TOTAL OF 3 DOSES IN 15 MINUTES   POTASSIUM CHLORIDE SA (K-DUR,KLOR-CON) 20 MEQ TABLET    Take 2 tablets (40 mEq total) by mouth 3 (three) times daily. Take extra 2 tablets (40 meq) if you take metolazone   RANITIDINE (ZANTAC) 300 MG TABLET    Take 300 mg by mouth at bedtime.   TIOTROPIUM (SPIRIVA) 18 MCG INHALATION CAPSULE    Place 18 mcg into inhaler and inhale daily.   TORSEMIDE (DEMADEX) 20 MG TABLET    Take 2 tablets (40 mg total) by mouth 2 (two) times daily.   TRAZODONE (DESYREL) 50 MG TABLET    Take 50 mg by mouth at bedtime.  Modified Medications   No medications on file  Discontinued Medications   No medications on file   ----------------------------------------------------------------------------------------------------------------------  Follow-up: Return for evaluation, med refill.   Procedure: 5 S1 LESI with fluoroscopic guidance and moderate sedation  NOTE:  The risks, benefits, and expectations of the procedure have been discussed and explained to the patient who was understanding and in agreement with suggested treatment plan. No guarantees were made.  DESCRIPTION OF PROCEDURE: Lumbar epidural steroid injection with 2 IV Versed, EKG, blood  pressure, pulse, and pulse oximetry monitoring. The procedure was performed with the patient in the prone position under fluoroscopic guidance.  Sterile prep x3 was initiated and I then injected subcutaneous lidocaine to the overlying 5 S1 site after its fluoroscopic identifictation.  Using strict aseptic technique, I then advanced an 18-gauge Tuohy epidural needle in the midline using interlaminar approach via loss-of-resistance to saline technique. There was negative aspiration for heme or  CSF.  I then confirmed position with both AP and Lateral fluoroscan.  2 cc of Isovue were injected and a  total of 5 mL of Preservative-Free normal saline mixed with 40 mg of Kenalog and 1cc Ropicaine 0.2 percent were injected incrementally via the  epidurally placed needle. The needle was removed. The patient tolerated the injection well and was convalesced and discharged to home in stable condition. Should the patient have any post procedure difficulty they have been instructed on how to contact us for assistance.    Molli Barrows, MD

## 2018-04-16 DIAGNOSIS — Z6841 Body Mass Index (BMI) 40.0 and over, adult: Secondary | ICD-10-CM | POA: Insufficient documentation

## 2018-05-19 ENCOUNTER — Telehealth: Payer: Self-pay | Admitting: Family

## 2018-05-19 MED ORDER — METOLAZONE 2.5 MG PO TABS: 2.5000 mg | ORAL_TABLET | Freq: Every day | ORAL | 0 refills | Status: DC

## 2018-05-19 NOTE — Telephone Encounter (Signed)
Patient called to say that in the last week, she's gained 10 pounds. She weighed 294 pounds about a week ago and today she weighs 304 pounds. She feels more short of breath and has noticed that the swelling in her legs is worse.   She's currently taking 60mg  torsemide BID along with 43meq potassium BID. Will add metolazone 2.5mg  daily for up to 3 days. Reminded her to take the metolazone in the morning, wait 1/2 hour and then take her torsemide. The days she takes the metolazone, she's to double her potassium and take 6meq BID.   If she responds well to 1-2 doses of metolazone, she can hold onto the 3rd dose. She sees her PCP tomorrow and her PCP can follow up on symptoms. Patient is comfortable with this plan.

## 2018-05-25 ENCOUNTER — Encounter: Payer: Self-pay | Admitting: Emergency Medicine

## 2018-05-25 ENCOUNTER — Other Ambulatory Visit: Payer: Self-pay

## 2018-05-25 ENCOUNTER — Inpatient Hospital Stay
Admission: EM | Admit: 2018-05-25 | Discharge: 2018-05-28 | DRG: 641 | Disposition: A | Discharge status: Home / Self Care | Payer: Medicare Other | Attending: Internal Medicine | Admitting: Internal Medicine

## 2018-05-25 ENCOUNTER — Emergency Department: Payer: Medicare Other

## 2018-05-25 DIAGNOSIS — I5032 Chronic diastolic (congestive) heart failure: Secondary | ICD-10-CM | POA: Diagnosis present

## 2018-05-25 DIAGNOSIS — N179 Acute kidney failure, unspecified: Secondary | ICD-10-CM | POA: Diagnosis not present

## 2018-05-25 DIAGNOSIS — Z823 Family history of stroke: Secondary | ICD-10-CM | POA: Diagnosis not present

## 2018-05-25 DIAGNOSIS — Z794 Long term (current) use of insulin: Secondary | ICD-10-CM

## 2018-05-25 DIAGNOSIS — Z833 Family history of diabetes mellitus: Secondary | ICD-10-CM

## 2018-05-25 DIAGNOSIS — I252 Old myocardial infarction: Secondary | ICD-10-CM | POA: Diagnosis not present

## 2018-05-25 DIAGNOSIS — Z8249 Family history of ischemic heart disease and other diseases of the circulatory system: Secondary | ICD-10-CM | POA: Diagnosis not present

## 2018-05-25 DIAGNOSIS — Z7951 Long term (current) use of inhaled steroids: Secondary | ICD-10-CM

## 2018-05-25 DIAGNOSIS — E876 Hypokalemia: Secondary | ICD-10-CM | POA: Diagnosis not present

## 2018-05-25 DIAGNOSIS — Z961 Presence of intraocular lens: Secondary | ICD-10-CM | POA: Diagnosis present

## 2018-05-25 DIAGNOSIS — E119 Type 2 diabetes mellitus without complications: Secondary | ICD-10-CM | POA: Diagnosis present

## 2018-05-25 DIAGNOSIS — Z886 Allergy status to analgesic agent status: Secondary | ICD-10-CM

## 2018-05-25 DIAGNOSIS — J449 Chronic obstructive pulmonary disease, unspecified: Secondary | ICD-10-CM | POA: Diagnosis present

## 2018-05-25 DIAGNOSIS — Z951 Presence of aortocoronary bypass graft: Secondary | ICD-10-CM

## 2018-05-25 DIAGNOSIS — K529 Noninfective gastroenteritis and colitis, unspecified: Secondary | ICD-10-CM | POA: Diagnosis present

## 2018-05-25 DIAGNOSIS — R42 Dizziness and giddiness: Secondary | ICD-10-CM | POA: Diagnosis present

## 2018-05-25 DIAGNOSIS — Z79899 Other long term (current) drug therapy: Secondary | ICD-10-CM

## 2018-05-25 DIAGNOSIS — E860 Dehydration: Secondary | ICD-10-CM | POA: Diagnosis not present

## 2018-05-25 DIAGNOSIS — F329 Major depressive disorder, single episode, unspecified: Secondary | ICD-10-CM | POA: Diagnosis not present

## 2018-05-25 DIAGNOSIS — R69 Illness, unspecified: Secondary | ICD-10-CM

## 2018-05-25 DIAGNOSIS — Z90710 Acquired absence of both cervix and uterus: Secondary | ICD-10-CM | POA: Diagnosis not present

## 2018-05-25 DIAGNOSIS — Z9842 Cataract extraction status, left eye: Secondary | ICD-10-CM

## 2018-05-25 DIAGNOSIS — Z9841 Cataract extraction status, right eye: Secondary | ICD-10-CM

## 2018-05-25 DIAGNOSIS — I110 Hypertensive heart disease with heart failure: Secondary | ICD-10-CM | POA: Diagnosis not present

## 2018-05-25 DIAGNOSIS — I959 Hypotension, unspecified: Secondary | ICD-10-CM | POA: Diagnosis not present

## 2018-05-25 DIAGNOSIS — M25562 Pain in left knee: Secondary | ICD-10-CM

## 2018-05-25 DIAGNOSIS — R531 Weakness: Secondary | ICD-10-CM

## 2018-05-25 DIAGNOSIS — I2510 Atherosclerotic heart disease of native coronary artery without angina pectoris: Secondary | ICD-10-CM | POA: Diagnosis present

## 2018-05-25 DIAGNOSIS — Z87891 Personal history of nicotine dependence: Secondary | ICD-10-CM

## 2018-05-25 DIAGNOSIS — J111 Influenza due to unidentified influenza virus with other respiratory manifestations: Secondary | ICD-10-CM

## 2018-05-25 DIAGNOSIS — Z818 Family history of other mental and behavioral disorders: Secondary | ICD-10-CM

## 2018-05-25 DIAGNOSIS — G8929 Other chronic pain: Secondary | ICD-10-CM | POA: Diagnosis not present

## 2018-05-25 DIAGNOSIS — F419 Anxiety disorder, unspecified: Secondary | ICD-10-CM | POA: Diagnosis present

## 2018-05-25 DIAGNOSIS — H538 Other visual disturbances: Secondary | ICD-10-CM | POA: Diagnosis not present

## 2018-05-25 DIAGNOSIS — Z79891 Long term (current) use of opiate analgesic: Secondary | ICD-10-CM

## 2018-05-25 DIAGNOSIS — Z7902 Long term (current) use of antithrombotics/antiplatelets: Secondary | ICD-10-CM

## 2018-05-25 LAB — COMPREHENSIVE METABOLIC PANEL
ALK PHOS: 136 U/L — AB (ref 38–126)
ALT: 31 U/L (ref 0–44)
AST: 37 U/L (ref 15–41)
Albumin: 4.4 g/dL (ref 3.5–5.0)
Anion gap: 16 — ABNORMAL HIGH (ref 5–15)
BILIRUBIN TOTAL: 1.1 mg/dL (ref 0.3–1.2)
BUN: 51 mg/dL — AB (ref 8–23)
CO2: 30 mmol/L (ref 22–32)
CREATININE: 2.09 mg/dL — AB (ref 0.44–1.00)
Calcium: 8.8 mg/dL — ABNORMAL LOW (ref 8.9–10.3)
Chloride: 89 mmol/L — ABNORMAL LOW (ref 98–111)
GFR calc Af Amer: 27 mL/min — ABNORMAL LOW (ref >60)
GFR calc non Af Amer: 23 mL/min — ABNORMAL LOW (ref >60)
Glucose, Bld: 158 mg/dL — ABNORMAL HIGH (ref 70–99)
Potassium: 2.7 mmol/L — CL (ref 3.5–5.1)
Sodium: 135 mmol/L (ref 135–145)
Total Protein: 7.8 g/dL (ref 6.5–8.1)

## 2018-05-25 LAB — URINALYSIS, COMPLETE (UACMP) WITH MICROSCOPIC
Bacteria, UA: NONE SEEN
Bilirubin Urine: NEGATIVE
GLUCOSE, UA: NEGATIVE mg/dL
Hgb urine dipstick: NEGATIVE
Ketones, ur: NEGATIVE mg/dL
Leukocytes, UA: NEGATIVE
Nitrite: NEGATIVE
PROTEIN: NEGATIVE mg/dL
Specific Gravity, Urine: 1.008 (ref 1.005–1.030)
pH: 7 (ref 5.0–8.0)

## 2018-05-25 LAB — GLUCOSE, CAPILLARY: Glucose-Capillary: 148 mg/dL — ABNORMAL HIGH (ref 70–99)

## 2018-05-25 LAB — LACTIC ACID, PLASMA: LACTIC ACID, VENOUS: 2.2 mmol/L — AB (ref 0.5–1.9)

## 2018-05-25 LAB — TROPONIN I: Troponin I: <0.03 ng/mL (ref <0.03)

## 2018-05-25 LAB — PROTIME-INR
INR: 0.98
Prothrombin Time: 12.9 seconds (ref 11.4–15.2)

## 2018-05-25 LAB — CBC
HEMATOCRIT: 37.5 % (ref 36.0–46.0)
HEMOGLOBIN: 11.8 g/dL — AB (ref 12.0–15.0)
MCH: 27.6 pg (ref 26.0–34.0)
MCHC: 31.5 g/dL (ref 30.0–36.0)
MCV: 87.8 fL (ref 80.0–100.0)
NRBC: 0 % (ref 0.0–0.2)
Platelets: 324 10*3/uL (ref 150–400)
RBC: 4.27 MIL/uL (ref 3.87–5.11)
RDW: 14.6 % (ref 11.5–15.5)
WBC: 9.3 10*3/uL (ref 4.0–10.5)

## 2018-05-25 LAB — POTASSIUM: Potassium: 2.6 mmol/L — CL (ref 3.5–5.1)

## 2018-05-25 LAB — MAGNESIUM: Magnesium: 2.3 mg/dL (ref 1.7–2.4)

## 2018-05-25 LAB — INFLUENZA PANEL BY PCR (TYPE A & B)
INFLAPCR: NEGATIVE
INFLBPCR: NEGATIVE

## 2018-05-25 MED ORDER — INSULIN GLARGINE 100 UNIT/ML ~~LOC~~ SOLN
10.0000 [IU] | Freq: Two times a day (BID) | SUBCUTANEOUS | Status: DC
  Administered 2018-05-25 – 2018-05-27 (×4): 10 [IU] via SUBCUTANEOUS
  Filled 2018-05-25 (×6): qty 0.1

## 2018-05-25 MED ORDER — ACETAMINOPHEN 325 MG PO TABS
650.0000 mg | ORAL_TABLET | Freq: Four times a day (QID) | ORAL | Status: DC | PRN
  Administered 2018-05-26 – 2018-05-27 (×2): 650 mg via ORAL
  Filled 2018-05-25 (×2): qty 2

## 2018-05-25 MED ORDER — MONTELUKAST SODIUM 10 MG PO TABS
10.0000 mg | ORAL_TABLET | Freq: Every day | ORAL | Status: DC
  Administered 2018-05-26 – 2018-05-28 (×3): 10 mg via ORAL
  Filled 2018-05-25 (×3): qty 1

## 2018-05-25 MED ORDER — POTASSIUM CHLORIDE 10 MEQ/100ML IV SOLN
10.0000 meq | Freq: Once | INTRAVENOUS | Status: AC
  Administered 2018-05-25: 23:00:00 10 meq via INTRAVENOUS
  Filled 2018-05-25: qty 100

## 2018-05-25 MED ORDER — SODIUM CHLORIDE 0.9 % IV SOLN
Freq: Once | INTRAVENOUS | Status: AC
  Administered 2018-05-25: 19:00:00 via INTRAVENOUS

## 2018-05-25 MED ORDER — LORATADINE 10 MG PO TABS
10.0000 mg | ORAL_TABLET | Freq: Every day | ORAL | Status: DC
  Administered 2018-05-26 – 2018-05-28 (×3): 10 mg via ORAL
  Filled 2018-05-25 (×3): qty 1

## 2018-05-25 MED ORDER — ALBUTEROL SULFATE HFA 108 (90 BASE) MCG/ACT IN AERS: 2.0000 | INHALATION_SPRAY | Freq: Four times a day (QID) | RESPIRATORY_TRACT | Status: DC | PRN

## 2018-05-25 MED ORDER — ACETAMINOPHEN 650 MG RE SUPP: 650.0000 mg | Freq: Four times a day (QID) | RECTAL | Status: DC | PRN

## 2018-05-25 MED ORDER — POTASSIUM CHLORIDE 10 MEQ/100ML IV SOLN
10.0000 meq | INTRAVENOUS | Status: AC
  Administered 2018-05-25 (×2): 10 meq via INTRAVENOUS
  Filled 2018-05-25 (×3): qty 100

## 2018-05-25 MED ORDER — SODIUM CHLORIDE 0.9 % IV BOLUS
250.0000 mL | Freq: Once | INTRAVENOUS | Status: AC
  Administered 2018-05-25: 250 mL via INTRAVENOUS

## 2018-05-25 MED ORDER — TIOTROPIUM BROMIDE MONOHYDRATE 18 MCG IN CAPS
18.0000 ug | ORAL_CAPSULE | Freq: Every day | RESPIRATORY_TRACT | Status: DC
  Administered 2018-05-26 – 2018-05-28 (×3): 18 ug via RESPIRATORY_TRACT
  Filled 2018-05-25: qty 5

## 2018-05-25 MED ORDER — INSULIN ASPART 100 UNIT/ML ~~LOC~~ SOLN
0.0000 [IU] | Freq: Three times a day (TID) | SUBCUTANEOUS | Status: DC
  Administered 2018-05-26: 7 [IU] via SUBCUTANEOUS
  Administered 2018-05-26: 12:00:00 5 [IU] via SUBCUTANEOUS
  Administered 2018-05-26: 1 [IU] via SUBCUTANEOUS
  Administered 2018-05-27: 2 [IU] via SUBCUTANEOUS
  Administered 2018-05-27: 7 [IU] via SUBCUTANEOUS
  Filled 2018-05-25 (×5): qty 1

## 2018-05-25 MED ORDER — MAGNESIUM SULFATE 2 GM/50ML IV SOLN
2.0000 g | Freq: Once | INTRAVENOUS | Status: AC
  Administered 2018-05-25: 2 g via INTRAVENOUS
  Filled 2018-05-25: qty 50

## 2018-05-25 MED ORDER — ALBUTEROL SULFATE (2.5 MG/3ML) 0.083% IN NEBU: 2.5000 mg | INHALATION_SOLUTION | RESPIRATORY_TRACT | Status: DC | PRN

## 2018-05-25 MED ORDER — CLOPIDOGREL BISULFATE 75 MG PO TABS
75.0000 mg | ORAL_TABLET | Freq: Every day | ORAL | Status: DC
  Administered 2018-05-26 – 2018-05-28 (×3): 75 mg via ORAL
  Filled 2018-05-25 (×3): qty 1

## 2018-05-25 MED ORDER — ENOXAPARIN SODIUM 40 MG/0.4ML ~~LOC~~ SOLN
40.0000 mg | Freq: Two times a day (BID) | SUBCUTANEOUS | Status: DC
  Administered 2018-05-25 – 2018-05-28 (×6): 40 mg via SUBCUTANEOUS
  Filled 2018-05-25 (×6): qty 0.4

## 2018-05-25 MED ORDER — ONDANSETRON HCL 4 MG/2ML IJ SOLN: 4.0000 mg | Freq: Four times a day (QID) | INTRAMUSCULAR | Status: DC | PRN

## 2018-05-25 MED ORDER — LEVETIRACETAM 500 MG PO TABS
1000.0000 mg | ORAL_TABLET | Freq: Two times a day (BID) | ORAL | Status: DC
  Administered 2018-05-25 – 2018-05-28 (×6): 1000 mg via ORAL
  Filled 2018-05-25 (×7): qty 2

## 2018-05-25 MED ORDER — PROMETHAZINE HCL 25 MG/ML IJ SOLN
12.5000 mg | Freq: Once | INTRAMUSCULAR | Status: AC
  Administered 2018-05-25: 12.5 mg via INTRAVENOUS

## 2018-05-25 MED ORDER — NITROGLYCERIN 0.4 MG SL SUBL: 0.4000 mg | SUBLINGUAL_TABLET | SUBLINGUAL | Status: DC | PRN

## 2018-05-25 MED ORDER — POTASSIUM CHLORIDE CRYS ER 20 MEQ PO TBCR
40.0000 meq | EXTENDED_RELEASE_TABLET | ORAL | Status: AC
  Administered 2018-05-25 – 2018-05-26 (×2): 40 meq via ORAL
  Filled 2018-05-25 (×2): qty 2

## 2018-05-25 MED ORDER — HYDROCODONE-ACETAMINOPHEN 5-325 MG PO TABS
1.0000 | ORAL_TABLET | Freq: Four times a day (QID) | ORAL | Status: DC | PRN
  Administered 2018-05-25 – 2018-05-28 (×4): 1 via ORAL
  Filled 2018-05-25 (×4): qty 1

## 2018-05-25 MED ORDER — ONDANSETRON HCL 4 MG PO TABS: 4.0000 mg | ORAL_TABLET | Freq: Four times a day (QID) | ORAL | Status: DC | PRN

## 2018-05-25 NOTE — ED Notes (Signed)
Date and time results received: 05/25/18 4:56 PM (use smartphrase ".now" to insert current time)  Test: Potassium Critical Value: 2.7  Name of Provider Notified: Dr. Quentin Cornwall  Orders Received? Or Actions Taken?: Orders Received - See Orders for details

## 2018-05-25 NOTE — Consult Note (Addendum)
PHARMACY CONSULT NOTE - FOLLOW UP  Pharmacy Consult for Electrolyte Monitoring and Replacement   Recent Labs: Potassium (mmol/L)  Date Value  05/25/2018 2.7 (LL)   Magnesium (mg/dL)  Date Value  05/25/2018 2.3   Calcium (mg/dL)  Date Value  05/25/2018 8.8 (L)   Albumin (g/dL)  Date Value  05/25/2018 4.4   Phosphorus (mg/dL)  Date Value  06/03/2015 2.8   Sodium (mmol/L)  Date Value  05/25/2018 135     Assessment: Pharmacy has been consulted for monitoring and replenishment of electrolytes   Goal of Therapy:  Electrolytes wnl's  Plan:  Patient received Mg x 2g in ED (will recheck tomorrow with am labs) Patient received KCl 10 meq x 3 in the ED (will recheck @ 2200 per protocol) (doses entered in ED prior to pharmacy consult)  Will continue to monitor and adjust electrolytes per protocol  Lu Duffel, PharmD, BCPS Clinical Pharmacist 05/25/2018 6:59 PM

## 2018-05-25 NOTE — Progress Notes (Signed)
Anticoagulation monitoring(Lovenox):  73 yo female ordered Lovenox 30 mg Q24h  Filed Weights   05/25/18 1553  Weight: (!) 303 lb (137.4 kg)   BMI 42   Lab Results  Component Value Date   CREATININE 2.09 (H) 05/25/2018   CREATININE 0.97 02/26/2018   CREATININE 1.34 (H) 12/03/2017   Estimated Creatinine Clearance: 38.5 mL/min (A) (by C-G formula based on SCr of 2.09 mg/dL (H)). Hemoglobin & Hematocrit     Component Value Date/Time   HGB 11.8 (L) 05/25/2018 1528   HCT 37.5 05/25/2018 1528     Per Protocol for Patient with estCrcl > 30 ml/min and BMI > 40, will transition to Lovenox 40 mg Q12h.

## 2018-05-25 NOTE — ED Notes (Signed)
X-ray at bedside

## 2018-05-25 NOTE — ED Triage Notes (Signed)
Pt presents from home via [facility] with c/o numbness on right side of body as well as blurred vision in right eye. Pt states symptoms began last night. Pt c/o dizziness with ambulation and headache as well. Pt currently alert and oriented x4 and able to ambulate from ems stretcher to ED stretcher. Pt recently diagnosed with pneumonia 2 weeks ago.

## 2018-05-25 NOTE — ED Notes (Signed)
Date and time results received: 05/25/18 4:58 PM  (use smartphrase ".now" to insert current time)  Test: Lactic Critical Value: 2.2  Name of Provider Notified: Dr. Quentin Cornwall  Orders Received? Or Actions Taken?: Orders Received - See Orders for details

## 2018-05-25 NOTE — ED Notes (Signed)
Patient transported to CT 

## 2018-05-25 NOTE — H&P (Signed)
[facility] [place] NAME: Tina Hayes    MR#:  [id number]  DATE OF BIRTH:  [birth year]  DATE OF ADMISSION:  05/25/2018  PRIMARY CARE PHYSICIAN: Soles, Howell Rucks, MD   REQUESTING/REFERRING PHYSICIAN: Quentin Cornwall MD  CHIEF COMPLAINT:  Dizziness  HISTORY OF PRESENT ILLNESS:  Tina Hayes  is a 73 y.o. female with a known history of insulin requiring diabetes mellitus, diastolic congestive heart failure, COPD, coronary artery disease, chronic back pain, anxiety and other medical problems is presenting to the ED with a chief complaint of nausea and vomiting for the past 5 days associated with diarrhea for the past 3 days and generalized abdominal pain.  Flu test is negative in the ED chest x-ray negative urinalysis is pending.  Patient is hypotensive and creatinine is at 2.09 which was normal in 02/2018.  Patient denies any chest pain or shortness of breath.  Denies any syncopal episodes.  No family members at bedside.  PAST MEDICAL HISTORY:   Past Medical History:  Diagnosis Date  . (HFpEF) heart failure with preserved ejection fraction ([place])    a. 05/2016 Echo: EF 60-65%, mild to mod LVH, Gr1 DD, mild MR, mildly dil LA, mod TR, mildly to mod increased PASP.  [name] Acute diastolic heart failure ([place]) 01/27/2017  . Anxiety   . Chest pain 06/16/2016  . CHF (congestive heart failure) ([place])   . Chronic back pain   . Chronic diastolic congestive heart failure ([place]) 02/13/2017  . COPD (chronic obstructive pulmonary disease) ([place])   . Coronary artery disease    a. s/p remote PCI x 5;  b. 2006 s/p CABG x 3 ([place]); b. 05/2016 MV: attenuation corrected images w/o ischemia or wma-->Med rx.  . Coronary artery disease of native artery of native heart with stable angina pectoris ([place]) 06/17/2016  . Depression   . Diabetes mellitus without complication ([place])   . Essential hypertension 06/30/2016  . Heart attack ([place])     Total of 3 per pt.  . Hypertension   . Hypertensive urgency 06/03/2015  . Seizure ([place])   . Seizures ([place])     PAST SURGICAL HISTOIRY:   Past Surgical History:  Procedure Laterality Date  . ABDOMINAL HYSTERECTOMY    . ANKLE SURGERY Right   . CATARACT EXTRACTION W/ INTRAOCULAR LENS  IMPLANT, BILATERAL    . COLONOSCOPY WITH PROPOFOL N/A 05/04/2017   Procedure: COLONOSCOPY WITH PROPOFOL;  Surgeon: [name], MD;  Location: [facility] ENDOSCOPY;  Service: Endoscopy;  Laterality: N/A;  . CORONARY ANGIOPLASTY    . CORONARY ARTERY BYPASS GRAFT     TRIPLE BYPASS  . ESOPHAGOGASTRODUODENOSCOPY (EGD) WITH PROPOFOL N/A 05/04/2017   Procedure: ESOPHAGOGASTRODUODENOSCOPY (EGD) WITH PROPOFOL;  Surgeon: [name], MD;  Location: [facility] ENDOSCOPY;  Service: Endoscopy;  Laterality: N/A;  . EXCISION MASS LOWER EXTREMETIES Right 03/02/2018   Procedure: EXCISION SOFT TISSUE MASS FROM MEDIAL ASPECT OF RIGHT ANKLE;  Surgeon: [name], MD;  Location: [facility] ORS;  Service: Orthopedics;  Laterality: Right;  . FRACTURE SURGERY    . KNEE SURGERY Right   . MOUTH SURGERY Left 07/14/2017    SOCIAL HISTORY:   Social History   Tobacco Use  . Smoking status: Former Smoker    Packs/day: 0.25    Years: 20.00    Pack years: 5.00    Types: Cigarettes    Last attempt to quit: 07/01/2006    Years since quitting: 11.9  .  Smokeless tobacco: Never Used  Substance Use Topics  . Alcohol use: No    Alcohol/week: 0.0 standard drinks    FAMILY HISTORY:   Family History  Problem Relation Age of Onset  . Diabetes Other   . Hypertension Other   . Diabetes Mother   . Heart failure Mother   . Heart disease Mother   . Heart attack Mother   . Stroke Mother   . Depression Mother   . Hypertension Mother   . Cancer Sister        brain  . Hypertension Sister   . Diabetes Brother   . Hypertension Brother   . Heart failure Sister   . Heart attack Sister   . SIDS Sister     DRUG ALLERGIES:   Allergies   Allergen Reactions  . Aspirin Anaphylaxis    REVIEW OF SYSTEMS:  CONSTITUTIONAL: No fever.  Reporting generalized weakness and dizziness EYES: No blurred or double vision.  EARS, NOSE, AND THROAT: No tinnitus or ear pain.  RESPIRATORY: No cough, shortness of breath, wheezing or hemoptysis.  CARDIOVASCULAR: No chest pain, orthopnea, edema.  GASTROINTESTINAL: Reports nausea, vomiting, diarrhea and generalized lower abdominal pain.  GENITOURINARY: No dysuria, hematuria.  ENDOCRINE: No polyuria, nocturia,  HEMATOLOGY: No anemia, easy bruising or bleeding SKIN: No rash or lesion. MUSCULOSKELETAL: No joint pain or arthritis.   NEUROLOGIC: No tingling, numbness, weakness.  PSYCHIATRY: No anxiety or depression.   MEDICATIONS AT HOME:   Prior to Admission medications   Medication Sig Start Date End Date Taking? Authorizing Provider  albuterol (PROVENTIL HFA;VENTOLIN HFA) 108 (90 Base) MCG/ACT inhaler Inhale 2 puffs into the lungs every 6 (six) hours as needed for wheezing or shortness of breath. 08/30/15   Epifanio Lesches, MD  albuterol (PROVENTIL) (2.5 MG/3ML) 0.083% nebulizer solution Take 2.5 mg every 4 (four) hours as needed by nebulization for wheezing or shortness of breath.    [provider]  amLODipine (NORVASC) 10 MG tablet Take 10 mg by mouth daily. 11/12/17   [provider]  atorvastatin (LIPITOR) 40 MG tablet Take 40 mg at bedtime by mouth.     [provider]  Calcium Carbonate-Vitamin D3 (CALCIUM 600-D) 600-400 MG-UNIT TABS Take 1 tablet 2 (two) times daily by mouth.    [provider]  carvedilol (COREG) 3.125 MG tablet Take 1 tablet (3.125 mg total) by mouth 2 (two) times daily with a meal. 08/12/16   Alisa Graff, FNP  cetirizine (ZYRTEC) 10 MG tablet Take 10 mg by mouth daily.    [provider]  citalopram (CELEXA) 20 MG tablet Take 20 mg by mouth at bedtime.     [provider]  clopidogrel (PLAVIX) 75 MG tablet  Take 1 tablet (75 mg total) by mouth daily. Patient not taking: Reported on 04/05/2018 08/14/16   Minna Merritts, MD  HYDROcodone-acetaminophen (NORCO/VICODIN) 5-325 MG tablet Take 1 tablet by mouth every 6 (six) hours as needed for moderate pain or severe pain. 04/05/18   Molli Barrows, MD  insulin aspart (NOVOLOG) 100 UNIT/ML injection Inject 5-10 Units into the skin See admin instructions. Take 10 units SQ in the morning, inject 5 units SQ in the afternoon and 5 units SQ in the evening with meal    [provider]  insulin glargine (LANTUS) 100 UNIT/ML injection Inject 30-40 Units into the skin See admin instructions. Inject 30 units in the morning at 40 units at bedtime.    [provider]  isosorbide mononitrate (  IMDUR) 30 MG 24 hr tablet Take 1 tablet (30 mg total) by mouth 2 (two) times daily. 08/12/16   Alisa Graff, FNP  levETIRAcetam (KEPPRA) 1000 MG tablet Take 1 tablet (1,000 mg total) by mouth 2 (two) times daily. 11/12/17 01/28/19  Saundra Shelling, MD  losartan (COZAAR) 100 MG tablet Take 100 mg by mouth daily.    [provider]  metolazone (ZAROXOLYN) 2.5 MG tablet Take 1 tablet (2.5 mg total) by mouth daily. Take 1/2 hour prior to torsemide 05/19/18 08/17/18  Darylene Price A, FNP  montelukast (SINGULAIR) 10 MG tablet Take 10 mg by mouth daily.  11/05/17   [provider]  nitroGLYCERIN (NITROSTAT) 0.4 MG SL tablet DISSOLVE ONE TABLET UNDER THE TONGUE EVERY 5 MINUTES AS NEEDED FOR CHEST PAIN.  DO NOT EXCEED A TOTAL OF 3 DOSES IN 15 MINUTES Patient taking differently: Place 0.4 mg under the tongue every 5 (five) minutes as needed for chest pain.  07/30/17   Minna Merritts, MD  potassium chloride SA (K-DUR,KLOR-CON) 20 MEQ tablet Take 2 tablets (40 mEq total) by mouth 3 (three) times daily. Take extra 2 tablets (40 meq) if you take metolazone Patient taking differently: Take 40 mEq by mouth 2 (two) times daily. Take extra 2 tablets (40 meq) if you take  metolazone 11/26/17   Darylene Price A, FNP  ranitidine (ZANTAC) 300 MG tablet Take 300 mg by mouth at bedtime.    [provider]  tiotropium (SPIRIVA) 18 MCG inhalation capsule Place 18 mcg into inhaler and inhale daily.    [provider]  torsemide (DEMADEX) 20 MG tablet Take 2 tablets (40 mg total) by mouth 2 (two) times daily. Patient taking differently: Take 60 mg by mouth 2 (two) times daily.  11/26/17 01/28/19  Alisa Graff, FNP  traZODone (DESYREL) 50 MG tablet Take 50 mg by mouth at bedtime.    [provider]      VITAL SIGNS:  Blood pressure 108/65, pulse (!) 58, temperature (!) 97.5 F (36.4 C), temperature source Oral, resp. rate 18, height 5\' 11"  (1.803 m), weight (!) 137.4 kg, SpO2 100 %.  PHYSICAL EXAMINATION:  GENERAL:  73 y.o.-year-old patient lying in the bed with no acute distress.  EYES: Pupils equal, round, reactive to light and accommodation. No scleral icterus. Extraocular muscles intact.  HEENT: Head atraumatic, normocephalic. Oropharynx and nasopharynx clear.  Dry mucous membranes NECK:  Supple, no jugular venous distention. No thyroid enlargement, no tenderness.  LUNGS: Normal breath sounds bilaterally, no wheezing, rales,rhonchi or crepitation. No use of accessory muscles of respiration.  CARDIOVASCULAR: S1, S2 normal. No murmurs, rubs, or gallops.  ABDOMEN: Soft, nondistended. Bowel sounds present.  Lower abdomen is tender no rebound tenderness EXTREMITIES: No pedal edema, cyanosis, or clubbing.  NEUROLOGIC: Awake alert and oriented x3 sensation intact. Gait not checked.  PSYCHIATRIC: The patient is alert and oriented x 3.  SKIN: No obvious rash, lesion, or ulcer.   LABORATORY PANEL:   CBC Recent Labs  Lab 05/25/18 1528  WBC 9.3  HGB 11.8*  HCT 37.5  PLT 324   ------------------------------------------------------------------------------------------------------------------  Chemistries  Recent Labs  Lab 05/25/18 1528   NA 135  K 2.7*  CL 89*  CO2 30  GLUCOSE 158*  BUN 51*  CREATININE 2.09*  CALCIUM 8.8*  AST 37  ALT 31  ALKPHOS 136*  BILITOT 1.1   ------------------------------------------------------------------------------------------------------------------  Cardiac Enzymes Recent Labs  Lab 05/25/18 1528  TROPONINI <0.03   ------------------------------------------------------------------------------------------------------------------  RADIOLOGY:  Ct Head Wo Contrast  Result Date: 05/25/2018 CLINICAL DATA:  Numbness to RIGHT side of body, blurred vision RIGHT eye, dizziness, headache, history essential hypertension, diabetes mellitus, COPD, CHF, coronary artery disease, seizure disorder EXAM: CT HEAD WITHOUT CONTRAST TECHNIQUE: Contiguous axial images were obtained from the base of the skull through the vertex without intravenous contrast. Sagittal and coronal MPR images reconstructed from axial data set. COMPARISON:  11/10/2017 FINDINGS: Brain: Generalized atrophy. Normal ventricular morphology. No midline shift or mass effect. Small vessel chronic ischemic changes of deep cerebral white matter. No intracranial hemorrhage, mass lesion, or evidence of acute infarction. Tiny BILATERAL basal ganglia calcifications unchanged. No extra-axial fluid collections. Vascular: Atherosclerotic calcification of internal carotid and vertebral arteries at skull base Skull: Intact Sinuses/Orbits: Clear Other: N/A IMPRESSION: Atrophy with small vessel chronic ischemic changes of deep cerebral white matter. No acute intracranial abnormalities. Electronically Signed   By: Lavonia Dana M.D.   On: 05/25/2018 17:23   Dg Chest Portable 1 View  Result Date: 05/25/2018 CLINICAL DATA:  Numbness RIGHT side of body, dizziness, headache, blurred vision RIGHT eye, chronic diastolic CHF, coronary artery disease, diabetes mellitus, essential hypertension, COPD, seizure disorder, former smoker EXAM: PORTABLE CHEST 1 VIEW  COMPARISON:  Portable exam 1642 hours compared to 08/15/2017 FINDINGS: Upper normal heart size post CABG. Density at the inferior mediastinum reflects hiatal hernia seen on the previous exam. Mediastinal contours and pulmonary vascularity otherwise normal. Minimal subsegmental atelectasis LEFT base. No pulmonary infiltrate, pleural effusion or pneumothorax. Bones demineralized. IMPRESSION: Hiatal hernia. Subsegmental atelectasis LEFT base. Electronically Signed   By: Lavonia Dana M.D.   On: 05/25/2018 17:15    EKG:   Orders placed or performed during the hospital encounter of 05/25/18  . EKG 12-Lead  . EKG 12-Lead    IMPRESSION AND PLAN:    #Acute kidney injury secondary to dehydration Admit to MedSurg unit Hydrate with IV fluids Avoid nephrotoxins and monitor renal function closely  #Acute gastroenteritis Check GI panel Hydrate with IV fluids, supportive treatment, liquid diet Flu test is negative  #Dehydration with hypotension Hydrate with IV fluids Hold antihypertensives and diuretics UA is pending  #Hypokalemia Replete and recheck and check potassium magnesium in a.m.  #Insulin requiring diabetes mellitus Currently patient is on clear liquid diet Sliding scale insulin  hold p.o. hypoglycemics Lantus 10 units subcu twice daily for basal coverage patient takes 30 units in a.m. and 40 units nightly at home  #COPD chronic no exacerbation continue nebulizer treatments with albuterol as needed and scheduled  All the records are reviewed and case discussed with ED provider. Management plans discussed with the patient, family and they are in agreement.  CODE STATUS: fc   TOTAL TIME TAKING CARE OF THIS PATIENT: 43  minutes.   Note: This dictation was prepared with Dragon dictation along with smaller phrase technology. Any transcriptional errors that result from this process are unintentional.  Nicholes Mango M.D on 05/25/2018 at 6:35 PM  Between 7am to 6pm - Pager -  [phone]  After 6pm go to [email or web address] - password [email or web address] [facility]  Office  [phone]  CC: Primary care physician; Herminio Commons, MD

## 2018-05-25 NOTE — Progress Notes (Signed)
Family Meeting Note  Advance Directive:yes  Today a meeting took place with the Tina Hayes.    The following clinical team members were present during this meeting:MD  The following were discussed:Tina Hayes's diagnosis: Acute gastroenteritis, acute kidney injury, dehydration, hypotension, hypokalemia, history of COPD, insulin requiring diabetes mellitus, diastolic congestive heart failure, essential hypertension, will be admitted to the hospital for IV hydration.  Will check GI panel.  Flu test is negative treatment plan of care discussed in detail with the Tina Hayes.  She verbalized understanding of the plan.    Tina Hayes's progosis: Unable to determine and Goals for treatment: Full Code  Son Beverely Low is the healthcare POA  Additional follow-up to be provided: Hospitalist  Time spent during discussion:17 min  Nicholes Mango, MD

## 2018-05-25 NOTE — ED Provider Notes (Signed)
[facility] Emergency Department Provider Note    First MD Initiated Contact with Patient 05/25/18 1541     (approximate)  I have reviewed the triage vital signs and the nursing notes.   HISTORY  Chief Complaint Numbness and Dizziness    HPI Tina Hayes is a 73 y.o. female the below listed past medical history presents to the ER headache blurry vision generalized weakness chest pain shortness of breath and nonproductive cough since last night at 7 PM.  Also having some stomach upset.  States that she does have moderate moderate right-sided headache associated blurry vision.  No recent antibiotics.  States she is also recently had urinary tract infections and feels she is having some discomfort as well.  Denies any flank pain.    Past Medical History:  Diagnosis Date  . (HFpEF) heart failure with preserved ejection fraction ([place])    a. 05/2016 Echo: EF 60-65%, mild to mod LVH, Gr1 DD, mild MR, mildly dil LA, mod TR, mildly to mod increased PASP.  [name] Acute diastolic heart failure ([place]) 01/27/2017  . Anxiety   . Chest pain 06/16/2016  . CHF (congestive heart failure) ([place])   . Chronic back pain   . Chronic diastolic congestive heart failure ([place]) 02/13/2017  . COPD (chronic obstructive pulmonary disease) ([place])   . Coronary artery disease    a. s/p remote PCI x 5;  b. 2006 s/p CABG x 3 ([place]); b. 05/2016 MV: attenuation corrected images w/o ischemia or wma-->Med rx.  . Coronary artery disease of native artery of native heart with stable angina pectoris ([place]) 06/17/2016  . Depression   . Diabetes mellitus without complication ([place])   . Essential hypertension 06/30/2016  . Heart attack ([place])    Total of 3 per pt.  . Hypertension   . Hypertensive urgency 06/03/2015  . Seizure ([place])   . Seizures ([place])    Family History  Problem Relation Age of Onset  . Diabetes Other   . Hypertension Other   . Diabetes Mother   . Heart  failure Mother   . Heart disease Mother   . Heart attack Mother   . Stroke Mother   . Depression Mother   . Hypertension Mother   . Cancer Sister        brain  . Hypertension Sister   . Diabetes Brother   . Hypertension Brother   . Heart failure Sister   . Heart attack Sister   . SIDS Sister    Past Surgical History:  Procedure Laterality Date  . ABDOMINAL HYSTERECTOMY    . ANKLE SURGERY Right   . CATARACT EXTRACTION W/ INTRAOCULAR LENS  IMPLANT, BILATERAL    . COLONOSCOPY WITH PROPOFOL N/A 05/04/2017   Procedure: COLONOSCOPY WITH PROPOFOL;  Surgeon: [name], MD;  Location[facility] ENDOSCOPY;  Service: Endoscopy;  Laterality: N/A;  . CORONARY ANGIOPLASTY    . CORONARY ARTERY BYPASS GRAFT     TRIPLE BYPASS  . ESOPHAGOGASTRODUODENOSCOPY (EGD) WITH PROPOFOL N/A 05/04/2017   Procedure: ESOPHAGOGASTRODUODENOSCOPY (EGD) WITH PROPOFOL;  Surgeon: [name], MD;  Location: [facility] ENDOSCOPY;  Service: Endoscopy;  Laterality: N/A;  . EXCISION MASS LOWER EXTREMETIES Right 03/02/2018   Procedure: EXCISION SOFT TISSUE MASS FROM MEDIAL ASPECT OF RIGHT ANKLE;  Surgeon: [name], MD;  Location: [facility] ORS;  Service: Orthopedics;  Laterality: Right;  . FRACTURE SURGERY    . KNEE SURGERY Right   . MOUTH SURGERY Left  07/14/2017   Patient Active Problem List   Diagnosis Date Noted  . Seizures ([place]) 11/10/2017  . Mass of right lower leg 09/11/2017  . Dysphagia 04/23/2017  . Chronic diastolic congestive heart failure ([place]) 02/13/2017  . Acute diastolic heart failure ([place]) 01/27/2017  . Depression 12/11/2016  . Essential hypertension 06/30/2016  . Snoring 06/30/2016  . Diabetes mellitus ([place]) 06/30/2016  . Coronary artery disease of native artery of native heart with stable angina pectoris ([place]) 06/17/2016  . Morbid obesity ([place]) 06/17/2016  . Chest pain 06/16/2016  . COPD (chronic obstructive pulmonary disease) ([place]) 08/28/2015  . Hypertensive urgency 06/03/2015  . COPD with  acute exacerbation ([place]) 04/14/2015      Prior to Admission medications   Medication Sig Start Date End Date Taking? Authorizing Provider  albuterol (PROVENTIL HFA;VENTOLIN HFA) 108 (90 Base) MCG/ACT inhaler Inhale 2 puffs into the lungs every 6 (six) hours as needed for wheezing or shortness of breath. 08/30/15   Epifanio Lesches, MD  albuterol (PROVENTIL) (2.5 MG/3ML) 0.083% nebulizer solution Take 2.5 mg every 4 (four) hours as needed by nebulization for wheezing or shortness of breath.    [provider]  amLODipine (NORVASC) 10 MG tablet Take 10 mg by mouth daily. 11/12/17   [provider]  atorvastatin (LIPITOR) 40 MG tablet Take 40 mg at bedtime by mouth.     [provider]  Calcium Carbonate-Vitamin D3 (CALCIUM 600-D) 600-400 MG-UNIT TABS Take 1 tablet 2 (two) times daily by mouth.    [provider]  carvedilol (COREG) 3.125 MG tablet Take 1 tablet (3.125 mg total) by mouth 2 (two) times daily with a meal. 08/12/16   Alisa Graff, FNP  cetirizine (ZYRTEC) 10 MG tablet Take 10 mg by mouth daily.    [provider]  citalopram (CELEXA) 20 MG tablet Take 20 mg by mouth at bedtime.     [provider]  clopidogrel (PLAVIX) 75 MG tablet Take 1 tablet (75 mg total) by mouth daily. Patient not taking: Reported on 04/05/2018 08/14/16   Minna Merritts, MD  HYDROcodone-acetaminophen (NORCO/VICODIN) 5-325 MG tablet Take 1 tablet by mouth every 6 (six) hours as needed for moderate pain or severe pain. 04/05/18   Molli Barrows, MD  insulin aspart (NOVOLOG) 100 UNIT/ML injection Inject 5-10 Units into the skin See admin instructions. Take 10 units SQ in the morning, inject 5 units SQ in the afternoon and 5 units SQ in the evening with meal    [provider]  insulin glargine (LANTUS) 100 UNIT/ML injection Inject 30-40 Units into the skin See admin instructions. Inject 30 units in the morning at 40 units at bedtime.    [provider]  isosorbide mononitrate (IMDUR) 30 MG 24 hr tablet Take 1 tablet (30 mg total) by mouth 2 (two) times daily. 08/12/16   Alisa Graff, FNP  levETIRAcetam (KEPPRA) 1000 MG tablet Take 1 tablet (1,000 mg total) by mouth 2 (two) times daily. 11/12/17 01/28/19  Saundra Shelling, MD  losartan (COZAAR) 100 MG tablet Take 100 mg by mouth daily.    [provider]  metolazone (ZAROXOLYN) 2.5 MG tablet Take 1 tablet (2.5 mg total) by mouth daily. Take 1/2 hour prior to torsemide 05/19/18 08/17/18  Darylene [place] A, FNP  montelukast (SINGULAIR) 10 MG tablet Take 10 mg by mouth daily.  11/05/17   [provider]  nitroGLYCERIN (NITROSTAT) 0.4 MG SL tablet DISSOLVE ONE TABLET UNDER THE TONGUE EVERY 5 MINUTES AS  NEEDED FOR CHEST PAIN.  DO NOT EXCEED A TOTAL OF 3 DOSES IN 15 MINUTES Patient taking differently: Place 0.4 mg under the tongue every 5 (five) minutes as needed for chest pain.  07/30/17   Minna Merritts, MD  potassium chloride SA (K-DUR,KLOR-CON) 20 MEQ tablet Take 2 tablets (40 mEq total) by mouth 3 (three) times daily. Take extra 2 tablets (40 meq) if you take metolazone Patient taking differently: Take 40 mEq by mouth 2 (two) times daily. Take extra 2 tablets (40 meq) if you take metolazone 11/26/17   Darylene [place] A, FNP  ranitidine (ZANTAC) 300 MG tablet Take 300 mg by mouth at bedtime.    [provider]  tiotropium (SPIRIVA) 18 MCG inhalation capsule Place 18 mcg into inhaler and inhale daily.    [provider]  torsemide (DEMADEX) 20 MG tablet Take 2 tablets (40 mg total) by mouth 2 (two) times daily. Patient taking differently: Take 60 mg by mouth 2 (two) times daily.  11/26/17 01/28/19  Alisa Graff, FNP  traZODone (DESYREL) 50 MG tablet Take 50 mg by mouth at bedtime.    [provider]    Allergies Aspirin    Social History Social History   Tobacco Use  . Smoking status: Former Smoker    Packs/day: 0.25    Years: 20.00    Pack  years: 5.00    Types: Cigarettes    Last attempt to quit: 07/01/2006    Years since quitting: 11.9  . Smokeless tobacco: Never Used  Substance Use Topics  . Alcohol use: No    Alcohol/week: 0.0 standard drinks  . Drug use: No    Review of Systems Patient denies headaches, rhinorrhea, blurry vision, numbness, shortness of breath, chest pain, edema, cough, abdominal pain, nausea, vomiting, diarrhea, dysuria, fevers, rashes or hallucinations unless otherwise stated above in HPI. ____________________________________________   PHYSICAL EXAM:  VITAL SIGNS: Vitals:   05/25/18 1553 05/25/18 1630  BP: (!) 80/59 (!) 89/66  Pulse: (!) 54   Resp: 18 16  Temp: (!) 97.5 F (36.4 C)   SpO2: 100% 96%    Constitutional: Alert and oriented.  Eyes: Conjunctivae are normal.  Head: Atraumatic. Nose: No congestion/rhinnorhea. Mouth/Throat: Mucous membranes are moist.   Neck: No stridor. Painless ROM.  Cardiovascular: Normal rate, regular rhythm. Grossly normal heart sounds.  Good peripheral circulation. Respiratory: Normal respiratory effort.  No retractions. Lungs with coarse bibasilar breathsound. Gastrointestinal: Soft and nontender. No distention. No abdominal bruits. No CVA tenderness. Genitourinary:  Musculoskeletal: No lower extremity tenderness nor edema.  No joint effusions. Neurologic:  Normal speech and language. No gross focal neurologic deficits are appreciated. No facial droop Skin:  Skin is warm, dry and intact. No rash noted. Psychiatric: Mood and affect are normal. Speech and behavior are normal.  ____________________________________________   LABS (all labs ordered are listed, but only abnormal results are displayed)  Results for orders placed or performed during the hospital encounter of 05/25/18 (from the past 24 hour(s))  CBC     Status: Abnormal   Collection Time: 05/25/18  3:28 PM  Result Value Ref Range   WBC 9.3 4.0 - 10.5 K/uL   RBC 4.27 3.87 - 5.11 MIL/uL    Hemoglobin 11.8 (L) 12.0 - 15.0 g/dL   HCT 37.5 36.0 - 46.0 %   MCV 87.8 80.0 - 100.0 fL   MCH 27.6 26.0 - 34.0 pg   MCHC 31.5 30.0 - 36.0 g/dL   RDW 14.6 11.5 -  15.5 %   Platelets 324 150 - 400 K/uL   nRBC 0.0 0.0 - 0.2 %  Comprehensive metabolic panel     Status: Abnormal   Collection Time: 05/25/18  3:28 PM  Result Value Ref Range   Sodium 135 135 - 145 mmol/L   Potassium 2.7 (LL) 3.5 - 5.1 mmol/L   Chloride 89 (L) 98 - 111 mmol/L   CO2 30 22 - 32 mmol/L   Glucose, Bld 158 (H) 70 - 99 mg/dL   BUN 51 (H) 8 - 23 mg/dL   Creatinine, Ser 2.09 (H) 0.44 - 1.00 mg/dL   Calcium 8.8 (L) 8.9 - 10.3 mg/dL   Total Protein 7.8 6.5 - 8.1 g/dL   Albumin 4.4 3.5 - 5.0 g/dL   AST 37 15 - 41 U/L   ALT 31 0 - 44 U/L   Alkaline Phosphatase 136 (H) 38 - 126 U/L   Total Bilirubin 1.1 0.3 - 1.2 mg/dL   GFR calc non Af Amer 23 (L) >60 mL/min   GFR calc Af Amer 27 (L) >60 mL/min   Anion gap 16 (H) 5 - 15  Influenza panel by PCR (type A & B)     Status: None   Collection Time: 05/25/18  3:28 PM  Result Value Ref Range   Influenza A By PCR NEGATIVE NEGATIVE   Influenza B By PCR NEGATIVE NEGATIVE  Lactic acid, plasma     Status: Abnormal   Collection Time: 05/25/18  3:28 PM  Result Value Ref Range   Lactic Acid, Venous 2.2 (HH) 0.5 - 1.9 mmol/L  Protime-INR     Status: None   Collection Time: 05/25/18  3:28 PM  Result Value Ref Range   Prothrombin Time 12.9 11.4 - 15.2 seconds   INR 0.98   Troponin I -     Status: None   Collection Time: 05/25/18  3:28 PM  Result Value Ref Range   Troponin I <0.03 <0.03 ng/mL   ____________________________________________  EKG My review and personal interpretation at Time: hypotension   Indication: weakness  Rate: 55  Rhythm: sinus brady Axis: normal Other: prolonged qt, nonspecific st abn, no stemi ____________________________________________  RADIOLOGY  I personally reviewed all radiographic images ordered to evaluate for the above acute complaints  and reviewed radiology reports and findings.  These findings were personally discussed with the patient.  Please see medical record for radiology report.  ____________________________________________   PROCEDURES  Procedure(s) performed:  .Critical Care Performed by: Merlyn Lot, MD Authorized by: Merlyn Lot, MD   Critical care provider statement:    Critical care time (minutes):  30   Critical care time was exclusive of:  Separately billable procedures and treating other patients   Critical care was necessary to treat or prevent imminent or life-threatening deterioration of the following conditions:  Dehydration   Critical care was time spent personally by me on the following activities:  Development of treatment plan with patient or surrogate, discussions with consultants, evaluation of patient's response to treatment, examination of patient, obtaining history from patient or surrogate, ordering and performing treatments and interventions, ordering and review of laboratory studies, ordering and review of radiographic studies, pulse oximetry, re-evaluation of patient's condition and review of old charts      Critical Care performed: yes ____________________________________________   INITIAL IMPRESSION / [place] / ED COURSE  Pertinent labs & imaging results that were available during my care of the patient were reviewed by me and considered in my  medical decision making (see chart for details).   DDX: Dehydration, flu, pneumonia, UTI, enteritis, AKI, electrolyte abnormality, heart failure, ACS  Tina Hayes is a 73 y.o. who presents to the ED with symptoms as described above.  Patient hypotensive but afebrile.  Does appear clinically dehydrated.  We will give IV fluids but slowly given the patient's history of heart failure.  The patient will be placed on continuous pulse oximetry and telemetry for monitoring.  Laboratory evaluation will be sent to evaluate  for the above complaints.     Clinical Course as of May 25 1750  Tue May 25, 2018  1706 Patient with evidence of AKI as well as a low potassium.  We will give IV magnesium as well as potassium replenishment.   [PR]  1062 Troponin is negative.  Patient still with soft blood pressures.  We will continue gentle IV hydration given her history of congestive heart failure.  I am repleting potassium with IV as well as IV magnesium.   [PR]    Clinical Course User Index [PR] Merlyn Lot, MD     As part of my medical decision making, I reviewed the following data within the [place] notes reviewed and incorporated, Labs reviewed, notes from prior ED visits and [place] Controlled Substance Database   ____________________________________________   FINAL CLINICAL IMPRESSION(S) / ED DIAGNOSES  Final diagnoses:  Weakness  Influenza-like illness  Hypotension, unspecified hypotension type  Hypokalemia      NEW MEDICATIONS STARTED DURING THIS VISIT:  New Prescriptions   No medications on file     Note:  This document was prepared using Dragon voice recognition software and may include unintentional dictation errors.    Merlyn Lot, MD 05/25/18 [phone]

## 2018-05-26 DIAGNOSIS — E860 Dehydration: Secondary | ICD-10-CM | POA: Diagnosis not present

## 2018-05-26 DIAGNOSIS — R42 Dizziness and giddiness: Secondary | ICD-10-CM | POA: Diagnosis not present

## 2018-05-26 LAB — GLUCOSE, CAPILLARY
Glucose-Capillary: 144 mg/dL — ABNORMAL HIGH (ref 70–99)
Glucose-Capillary: 289 mg/dL — ABNORMAL HIGH (ref 70–99)
Glucose-Capillary: 313 mg/dL — ABNORMAL HIGH (ref 70–99)
Glucose-Capillary: 217 mg/dL — ABNORMAL HIGH (ref 70–99)

## 2018-05-26 LAB — COMPREHENSIVE METABOLIC PANEL
ALT: 28 U/L (ref 0–44)
AST: 27 U/L (ref 15–41)
Albumin: 3.7 g/dL (ref 3.5–5.0)
Alkaline Phosphatase: 116 U/L (ref 38–126)
Anion gap: 11 (ref 5–15)
BILIRUBIN TOTAL: 1 mg/dL (ref 0.3–1.2)
BUN: 51 mg/dL — AB (ref 8–23)
CO2: 29 mmol/L (ref 22–32)
Calcium: 8.2 mg/dL — ABNORMAL LOW (ref 8.9–10.3)
Chloride: 95 mmol/L — ABNORMAL LOW (ref 98–111)
Creatinine, Ser: 1.8 mg/dL — ABNORMAL HIGH (ref 0.44–1.00)
GFR calc Af Amer: 32 mL/min — ABNORMAL LOW (ref >60)
GFR calc non Af Amer: 28 mL/min — ABNORMAL LOW (ref >60)
Glucose, Bld: 130 mg/dL — ABNORMAL HIGH (ref 70–99)
Potassium: 3 mmol/L — ABNORMAL LOW (ref 3.5–5.1)
SODIUM: 135 mmol/L (ref 135–145)
Total Protein: 6.7 g/dL (ref 6.5–8.1)

## 2018-05-26 LAB — GASTROINTESTINAL PANEL BY PCR, STOOL (REPLACES STOOL CULTURE)
Adenovirus F40/41: NOT DETECTED
Astrovirus: NOT DETECTED
CYCLOSPORA CAYETANENSIS: NOT DETECTED
Campylobacter species: NOT DETECTED
Cryptosporidium: NOT DETECTED
ENTEROAGGREGATIVE E COLI (EAEC): NOT DETECTED
Entamoeba histolytica: NOT DETECTED
Enteropathogenic E coli (EPEC): NOT DETECTED
Enterotoxigenic E coli (ETEC): NOT DETECTED
GIARDIA LAMBLIA: NOT DETECTED
Norovirus GI/GII: NOT DETECTED
Plesimonas shigelloides: NOT DETECTED
Rotavirus A: NOT DETECTED
Salmonella species: NOT DETECTED
Sapovirus (I, II, IV, and V): NOT DETECTED
Shiga like toxin producing E coli (STEC): NOT DETECTED
Shigella/Enteroinvasive E coli (EIEC): NOT DETECTED
Vibrio cholerae: NOT DETECTED
Vibrio species: NOT DETECTED
Yersinia enterocolitica: NOT DETECTED

## 2018-05-26 LAB — CBC
HCT: 33.6 % — ABNORMAL LOW (ref 36.0–46.0)
Hemoglobin: 10.7 g/dL — ABNORMAL LOW (ref 12.0–15.0)
MCH: 27.4 pg (ref 26.0–34.0)
MCHC: 31.8 g/dL (ref 30.0–36.0)
MCV: 86.2 fL (ref 80.0–100.0)
Platelets: 264 10*3/uL (ref 150–400)
RBC: 3.9 MIL/uL (ref 3.87–5.11)
RDW: 14.8 % (ref 11.5–15.5)
WBC: 9.3 10*3/uL (ref 4.0–10.5)
nRBC: 0 % (ref 0.0–0.2)

## 2018-05-26 LAB — RESPIRATORY PANEL BY PCR
Adenovirus: NOT DETECTED
Bordetella pertussis: NOT DETECTED
Chlamydophila pneumoniae: NOT DETECTED
Coronavirus 229E: NOT DETECTED
Coronavirus HKU1: NOT DETECTED
Coronavirus NL63: NOT DETECTED
Coronavirus OC43: NOT DETECTED
INFLUENZA B-RVPPCR: NOT DETECTED
Influenza A: NOT DETECTED
MYCOPLASMA PNEUMONIAE-RVPPCR: NOT DETECTED
Metapneumovirus: NOT DETECTED
Parainfluenza Virus 1: NOT DETECTED
Parainfluenza Virus 2: NOT DETECTED
Parainfluenza Virus 3: NOT DETECTED
Parainfluenza Virus 4: NOT DETECTED
RESPIRATORY SYNCYTIAL VIRUS-RVPPCR: NOT DETECTED
Rhinovirus / Enterovirus: NOT DETECTED

## 2018-05-26 LAB — MAGNESIUM: MAGNESIUM: 2.8 mg/dL — AB (ref 1.7–2.4)

## 2018-05-26 LAB — C DIFFICILE QUICK SCREEN W PCR REFLEX
C Diff antigen: NEGATIVE
C Diff interpretation: NOT DETECTED
C Diff toxin: NEGATIVE

## 2018-05-26 LAB — PHOSPHORUS: Phosphorus: 4 mg/dL (ref 2.5–4.6)

## 2018-05-26 MED ORDER — BOOST / RESOURCE BREEZE PO LIQD CUSTOM
1.0000 | Freq: Three times a day (TID) | ORAL | Status: DC
  Administered 2018-05-26 – 2018-05-28 (×6): 1 via ORAL

## 2018-05-26 MED ORDER — POTASSIUM CHLORIDE CRYS ER 20 MEQ PO TBCR
40.0000 meq | EXTENDED_RELEASE_TABLET | ORAL | Status: AC
  Administered 2018-05-26 (×2): 40 meq via ORAL
  Filled 2018-05-26 (×2): qty 2

## 2018-05-26 NOTE — Progress Notes (Signed)
Initial Nutrition Assessment  DOCUMENTATION CODES:   Morbid obesity  INTERVENTION:   - Boost Breeze po TID, each supplement provides 250 kcal and 9 grams of protein  - Monitor for diet advancement and order supplements as appropriate (pt prefers strawberry-flavored supplements)  NUTRITION DIAGNOSIS:   Inadequate oral intake related to poor appetite, nausea as evidenced by per patient/family report.  GOAL:   Patient will meet greater than or equal to 90% of their needs  MONITOR:   PO intake, Supplement acceptance, Diet advancement, Labs, I & O's, Weight trends  REASON FOR ASSESSMENT:   Malnutrition Screening Tool    ASSESSMENT:   73 year old female who presented to the ED on 1/28 with right-sided numbness and blurred vision. PMH significant for CHF, COPD, CAD, DM, HTN. Pt admitted with AKI 2/2 dehydration and acute gastroenteritis.  Spoke with pt at bedside who reports having a decreased appetite over the last 2 weeks. Pt states that "I've never been a big eater" but noticed that her appetite was more poor than usual recently. Pt states that she typically eats yogurt, shakes like Boost and Ensure, and other soft foods when she is feeling bad.  Pt shares that she drank juice and consumed broth and jello from her clear liquid breakfast tray this morning. Pt is amenable to trying Boost Breeze.  Pt states that her weight fluctuates between 290-317 lbs due to fluid. Pt denies any downward trends in her weight. Per weight history in chart, pt has lost 15.2 kg since 08/10/17. This is a 10.4% weight loss in 9 months which is not quite significant for timeframe. However, RD notes an overall downward trend in weight since that date.  Pt shares that she ambulates with a cane but that "they are trying to get me a wheelchair."  Pt endorses nausea at this time but denies vomiting. Pt states that she last vomited yesterday. Pt states that this is not a chronic problem for her. Pt states that  she does not having issues chewing but occasionally has pain or burning when swallowing. Pt attributes this to acid reflux.  Pt states that her bowel movements are regular and denies any diarrhea or constipation.  Medications reviewed and include: SSI, Lantus 10 units BID, K-dur 40 mEq q 4 hours  Labs reviewed: potassium 3.0 (L), BUN 51 (H), creatinine 1.80 (H), magnesium 2.8 (L) CBG's: 144, 148  NUTRITION - FOCUSED PHYSICAL EXAM:    Most Recent Value  Orbital Region  No depletion  Upper Arm Region  No depletion  Thoracic and Lumbar Region  No depletion  Buccal Region  No depletion  Temple Region  No depletion  Clavicle Bone Region  No depletion  Clavicle and Acromion Bone Region  No depletion  Scapular Bone Region  No depletion  Dorsal Hand  No depletion  Patellar Region  No depletion  Anterior Thigh Region  No depletion  Posterior Calf Region  No depletion  Edema (RD Assessment)  Mild [BLE]  Hair  Reviewed  Eyes  Reviewed  Mouth  Reviewed  Skin  Reviewed  Nails  Reviewed       Diet Order:   Diet Order            Diet clear liquid Room service appropriate? Yes; Fluid consistency: Thin  Diet effective now              EDUCATION NEEDS:   No education needs have been identified at this time  Skin:  Skin Assessment: Reviewed RN Assessment  Last BM:  PTA/unknown  Height:   Ht Readings from Last 1 Encounters:  05/25/18 5\' 11"  (1.803 m)    Weight:   Wt Readings from Last 1 Encounters:  05/25/18 131.1 kg    Ideal Body Weight:  70.5 kg  BMI:  Body mass index is 40.32 kg/m.  Estimated Nutritional Needs:   Kcal:  1900-2100  Protein:  100-115  Fluid:  >/= 1.8 L    Tina Face, MS, RD, LDN Inpatient Clinical Dietitian Pager: [phone] Weekend/After Hours: [phone]

## 2018-05-26 NOTE — Progress Notes (Signed)
   05/26/18 1500  Clinical Encounter Type  Visited With Patient  Visit Type Initial  Spiritual Encounters  Spiritual Needs Prayer  Pt was alert and reported that she was feeling better than when she first came. Pt shared her wishes to go home and to be strong again and ch prayed for her on these requests.

## 2018-05-26 NOTE — Progress Notes (Signed)
Pharmacy Electrolyte Monitoring Consult:  Pharmacy consulted to assist in monitoring and replacing electrolytes in this 73 y.o. female admitted on 05/25/2018 with Numbness and Dizziness  Labs:  Sodium (mmol/L)  Date Value  05/26/2018 135   Potassium (mmol/L)  Date Value  05/26/2018 3.0 (L)   Magnesium (mg/dL)  Date Value  05/26/2018 2.8 (H)   Phosphorus (mg/dL)  Date Value  05/26/2018 4.0   Calcium (mg/dL)  Date Value  05/26/2018 8.2 (L)   Albumin (g/dL)  Date Value  05/26/2018 3.7    Assessment/Plan: Patient received magnesium 2g IV x 1 and potassium 76mEq IV Q1hr x 3 doses on 1/28. Patient received additional potassium 104mEq PO Q4hr  X 2 doses overnight and ordered potassium 82mEq PO Q4hr x 2 doses by MD this am.   Will obtain follow-up electrolytes with am labs.    Pharmacy will continue to monitor and adjust per consult.   Simpson,Michael L 05/26/2018 12:59 PM

## 2018-05-26 NOTE — Plan of Care (Signed)

## 2018-05-26 NOTE — Progress Notes (Signed)
[facility] [place] NAME: Tina Hayes    MR#:  [id number]  DATE OF BIRTH:  July 31, 1945  SUBJECTIVE:  CHIEF COMPLAINT:   Chief Complaint  Patient presents with  . Numbness  . Dizziness  having loose bowel movements, feels very weak.  Potassium 3.0 REVIEW OF SYSTEMS:  Review of Systems  Constitutional: Positive for malaise/fatigue. Negative for diaphoresis, fever and weight loss.  HENT: Negative for ear discharge, ear pain, hearing loss, nosebleeds, sore throat and tinnitus.   Eyes: Negative for blurred vision and pain.  Respiratory: Negative for cough, hemoptysis, shortness of breath and wheezing.   Cardiovascular: Negative for chest pain, palpitations, orthopnea and leg swelling.  Gastrointestinal: Positive for diarrhea. Negative for abdominal pain, blood in stool, constipation, heartburn, nausea and vomiting.  Genitourinary: Negative for dysuria, frequency and urgency.  Musculoskeletal: Negative for back pain and myalgias.  Skin: Negative for itching and rash.  Neurological: Negative for dizziness, tingling, tremors, focal weakness, seizures, weakness and headaches.  Psychiatric/Behavioral: Negative for depression. The patient is not nervous/anxious.    DRUG ALLERGIES:   Allergies  Allergen Reactions  . Aspirin Anaphylaxis   VITALS:  Blood pressure (!) 119/57, pulse (!) 55, temperature 98.2 F (36.8 C), temperature source Oral, resp. rate 18, height 5\' 11"  (1.803 m), weight 131.1 kg, SpO2 100 %. PHYSICAL EXAMINATION:  Physical Exam HENT:     Head: Normocephalic and atraumatic.  Eyes:     Conjunctiva/sclera: Conjunctivae normal.     Pupils: Pupils are equal, round, and reactive to light.  Neck:     Musculoskeletal: Normal range of motion and neck supple.     Thyroid: No thyromegaly.     Trachea: No tracheal deviation.  Cardiovascular:     Rate and Rhythm: Normal rate and regular rhythm.     Heart sounds: Normal heart  sounds.  Pulmonary:     Effort: Pulmonary effort is normal. No respiratory distress.     Breath sounds: Normal breath sounds. No wheezing.  Chest:     Chest wall: No tenderness.  Abdominal:     General: Bowel sounds are normal. There is no distension.     Palpations: Abdomen is soft.     Tenderness: There is no abdominal tenderness.  Musculoskeletal: Normal range of motion.  Skin:    General: Skin is warm and dry.     Findings: No rash.  Neurological:     Mental Status: She is alert and oriented to person, place, and time.     Cranial Nerves: No cranial nerve deficit.    LABORATORY PANEL:  Female CBC Recent Labs  Lab 05/26/18 0509  WBC 9.3  HGB 10.7*  HCT 33.6*  PLT 264   ------------------------------------------------------------------------------------------------------------------ Chemistries  Recent Labs  Lab 05/26/18 0509  NA 135  K 3.0*  CL 95*  CO2 29  GLUCOSE 130*  BUN 51*  CREATININE 1.80*  CALCIUM 8.2*  MG 2.8*  AST 27  ALT 28  ALKPHOS 116  BILITOT 1.0   RADIOLOGY:  Ct Head Wo Contrast  Result Date: 05/25/2018 CLINICAL DATA:  Numbness to RIGHT side of body, blurred vision RIGHT eye, dizziness, headache, history essential hypertension, diabetes mellitus, COPD, CHF, coronary artery disease, seizure disorder EXAM: CT HEAD WITHOUT CONTRAST TECHNIQUE: Contiguous axial images were obtained from the base of the skull through the vertex without intravenous contrast. Sagittal and coronal MPR images reconstructed from axial data set. COMPARISON:  11/10/2017 FINDINGS: Brain: Generalized atrophy. Normal ventricular  morphology. No midline shift or mass effect. Small vessel chronic ischemic changes of deep cerebral white matter. No intracranial hemorrhage, mass lesion, or evidence of acute infarction. Tiny BILATERAL basal ganglia calcifications unchanged. No extra-axial fluid collections. Vascular: Atherosclerotic calcification of internal carotid and vertebral arteries  at skull base Skull: Intact Sinuses/Orbits: Clear Other: N/A IMPRESSION: Atrophy with small vessel chronic ischemic changes of deep cerebral white matter. No acute intracranial abnormalities. Electronically Signed   By: Lavonia Dana M.D.   On: 05/25/2018 17:23   Dg Chest Portable 1 View  Result Date: 05/25/2018 CLINICAL DATA:  Numbness RIGHT side of body, dizziness, headache, blurred vision RIGHT eye, chronic diastolic CHF, coronary artery disease, diabetes mellitus, essential hypertension, COPD, seizure disorder, former smoker EXAM: PORTABLE CHEST 1 VIEW COMPARISON:  Portable exam 1642 hours compared to 08/15/2017 FINDINGS: Upper normal heart size post CABG. Density at the inferior mediastinum reflects hiatal hernia seen on the previous exam. Mediastinal contours and pulmonary vascularity otherwise normal. Minimal subsegmental atelectasis LEFT base. No pulmonary infiltrate, pleural effusion or pneumothorax. Bones demineralized. IMPRESSION: Hiatal hernia. Subsegmental atelectasis LEFT base. Electronically Signed   By: Lavonia Dana M.D.   On: 05/25/2018 17:15   ASSESSMENT AND PLAN:   #Acute kidney injury secondary to dehydration -continue IV fluids Avoid nephrotoxins and monitor renal function closely  #Acute gastroenteritis pending GI panel - continue IV fluids, supportive treatment, liquid diet Flu test is negative  #Dehydration with hypotension - continue IV fluids Hold antihypertensives and diuretics UA is neg  #Hypokalemia Replete and recheck and check potassium magnesium in a.m.  #Insulin requiring diabetes mellitus - on clear liquid diet Sliding scale insulin, hold p.o. hypoglycemics Lantus 10 units subcu twice daily for basal coverage patient takes 30 units in a.m. and 40 units nightly at home  #COPD chronic no exacerbation continue nebulizer treatments with albuterol as needed and scheduled     All the records are reviewed and case discussed with Care Management/Social  Worker. Management plans discussed with the patient, family and they are in agreement.  CODE STATUS: Full Code  TOTAL TIME TAKING CARE OF THIS PATIENT: 35 minutes.   More than 50% of the time was spent in counseling/coordination of care: YES  POSSIBLE D/C IN 1-2 DAYS, DEPENDING ON CLINICAL CONDITION.   Max Sane M.D on 05/26/2018 at 1:24 PM  Between 7am to 6pm - Pager - [phone]  After 6pm go to [email or web address] - Proofreader  [facility]  Office  [phone]  CC: Primary care physician; Herminio Commons, MD  Note: This dictation was prepared with Dragon dictation along with smaller phrase technology. Any transcriptional errors that result from this process are unintentional.

## 2018-05-27 DIAGNOSIS — E860 Dehydration: Secondary | ICD-10-CM | POA: Diagnosis not present

## 2018-05-27 DIAGNOSIS — R42 Dizziness and giddiness: Secondary | ICD-10-CM | POA: Diagnosis not present

## 2018-05-27 LAB — GLUCOSE, CAPILLARY
Glucose-Capillary: 169 mg/dL — ABNORMAL HIGH (ref 70–99)
Glucose-Capillary: 311 mg/dL — ABNORMAL HIGH (ref 70–99)
Glucose-Capillary: 261 mg/dL — ABNORMAL HIGH (ref 70–99)
Glucose-Capillary: 202 mg/dL — ABNORMAL HIGH (ref 70–99)

## 2018-05-27 LAB — HIV ANTIBODY (ROUTINE TESTING W REFLEX): HIV Screen 4th Generation wRfx: NONREACTIVE

## 2018-05-27 LAB — CBC
HCT: 33.5 % — ABNORMAL LOW (ref 36.0–46.0)
Hemoglobin: 10.5 g/dL — ABNORMAL LOW (ref 12.0–15.0)
MCH: 27.4 pg (ref 26.0–34.0)
MCHC: 31.3 g/dL (ref 30.0–36.0)
MCV: 87.5 fL (ref 80.0–100.0)
Platelets: 259 10*3/uL (ref 150–400)
RBC: 3.83 MIL/uL — ABNORMAL LOW (ref 3.87–5.11)
RDW: 14.4 % (ref 11.5–15.5)
WBC: 6.2 10*3/uL (ref 4.0–10.5)
nRBC: 0 % (ref 0.0–0.2)

## 2018-05-27 LAB — BASIC METABOLIC PANEL
Anion gap: 10 (ref 5–15)
BUN: 42 mg/dL — ABNORMAL HIGH (ref 8–23)
CO2: 28 mmol/L (ref 22–32)
Calcium: 8.6 mg/dL — ABNORMAL LOW (ref 8.9–10.3)
Chloride: 100 mmol/L (ref 98–111)
Creatinine, Ser: 1.12 mg/dL — ABNORMAL HIGH (ref 0.44–1.00)
GFR calc Af Amer: 58 mL/min — ABNORMAL LOW (ref >60)
GFR calc non Af Amer: 50 mL/min — ABNORMAL LOW (ref >60)
Glucose, Bld: 181 mg/dL — ABNORMAL HIGH (ref 70–99)
Potassium: 3.2 mmol/L — ABNORMAL LOW (ref 3.5–5.1)
Sodium: 138 mmol/L (ref 135–145)

## 2018-05-27 LAB — HEMOGLOBIN A1C
Hgb A1c MFr Bld: 7.5 % — ABNORMAL HIGH (ref 4.8–5.6)
Mean Plasma Glucose: 168.55 mg/dL

## 2018-05-27 LAB — MAGNESIUM: Magnesium: 2.4 mg/dL (ref 1.7–2.4)

## 2018-05-27 MED ORDER — POTASSIUM CHLORIDE CRYS ER 20 MEQ PO TBCR
40.0000 meq | EXTENDED_RELEASE_TABLET | Freq: Once | ORAL | Status: AC
  Administered 2018-05-27: 10:00:00 40 meq via ORAL
  Filled 2018-05-27: qty 2

## 2018-05-27 MED ORDER — ATORVASTATIN CALCIUM 20 MG PO TABS
40.0000 mg | ORAL_TABLET | Freq: Every day | ORAL | Status: DC
  Administered 2018-05-27: 40 mg via ORAL
  Filled 2018-05-27: qty 2

## 2018-05-27 MED ORDER — INSULIN GLARGINE 100 UNIT/ML ~~LOC~~ SOLN
10.0000 [IU] | Freq: Every day | SUBCUTANEOUS | Status: DC
  Administered 2018-05-27: 10 [IU] via SUBCUTANEOUS
  Filled 2018-05-27 (×2): qty 0.1

## 2018-05-27 MED ORDER — INSULIN ASPART 100 UNIT/ML ~~LOC~~ SOLN
6.0000 [IU] | Freq: Three times a day (TID) | SUBCUTANEOUS | Status: DC
  Administered 2018-05-27 – 2018-05-28 (×3): 6 [IU] via SUBCUTANEOUS
  Filled 2018-05-27 (×3): qty 1

## 2018-05-27 MED ORDER — INSULIN ASPART 100 UNIT/ML ~~LOC~~ SOLN
0.0000 [IU] | Freq: Three times a day (TID) | SUBCUTANEOUS | Status: DC
  Administered 2018-05-27: 5 [IU] via SUBCUTANEOUS
  Administered 2018-05-28: 13:00:00 3 [IU] via SUBCUTANEOUS
  Administered 2018-05-28: 09:00:00 2 [IU] via SUBCUTANEOUS
  Filled 2018-05-27 (×3): qty 1

## 2018-05-27 MED ORDER — INSULIN GLARGINE 100 UNIT/ML ~~LOC~~ SOLN
15.0000 [IU] | Freq: Every day | SUBCUTANEOUS | Status: DC
  Administered 2018-05-28: 10:00:00 15 [IU] via SUBCUTANEOUS
  Filled 2018-05-27 (×2): qty 0.15

## 2018-05-27 MED ORDER — FAMOTIDINE 20 MG PO TABS
40.0000 mg | ORAL_TABLET | Freq: Every day | ORAL | Status: DC
  Administered 2018-05-27: 40 mg via ORAL
  Filled 2018-05-27: qty 2

## 2018-05-27 MED ORDER — INSULIN ASPART 100 UNIT/ML ~~LOC~~ SOLN
0.0000 [IU] | Freq: Every day | SUBCUTANEOUS | Status: DC
  Administered 2018-05-27: 21:00:00 2 [IU] via SUBCUTANEOUS
  Filled 2018-05-27: qty 1

## 2018-05-27 MED ORDER — CITALOPRAM HYDROBROMIDE 20 MG PO TABS
20.0000 mg | ORAL_TABLET | Freq: Every day | ORAL | Status: DC
  Administered 2018-05-27 – 2018-05-28 (×2): 20 mg via ORAL
  Filled 2018-05-27 (×2): qty 1

## 2018-05-27 NOTE — Evaluation (Signed)
Physical Therapy Evaluation Patient Details Name: Tina Hayes MRN: [id number] DOB: January 31, 1946 Today's Date: 05/27/2018   History of Present Illness  73 y.o. female with a known history of insulin requiring diabetes mellitus, diastolic congestive heart failure, COPD, coronary artery disease, chronic back pain, anxiety, presents with a chief complaint of nausea and vomiting for the past 5 days associated with diarrhea for the past 3 days and generalized abdominal pain and weakness. Admitted with AKI.  Clinical Impression  Pt did well with ambulation and showed good confidence/safety with mobility/transfers.  She reports that apart from mild abdominal pain she feels near her baseline.  She did not have any significant fatigue with 200 ft of ambulation (O2 remained in the high 90s) and overall she showed ability to safely return home with the assist level she had available.  Pt agrees that she does not need HHPT, but knows she will need some soon after she has a knee replacement in the near future.    Follow Up Recommendations No PT follow up    Equipment Recommendations  3in1 (PT)    Recommendations for Other Services       Precautions / Restrictions Precautions Precautions: Fall Restrictions Weight Bearing Restrictions: No      Mobility  Bed Mobility               General bed mobility comments: in recliner on arrival, returned to recliner.    Transfers Overall transfer level: Independent Equipment used: None             General transfer comment: Pt able to rise from recliner with good confidence and no safety issues.  Did give RW once she was up.  Ambulation/Gait Ambulation/Gait assistance: Supervision Gait Distance (Feet): 200 Feet Assistive device: Rolling walker (2 wheeled);1 person hand held assist       General Gait Details: Pt was able to ambulate with consistent cadence and good confidence using walker.  She was also able to easily ambulate with single UE  use of hallway rail.  Pt's O2 remained in the high 90s the entire time, only minimal HR increase to mid 90s.   Stairs            Wheelchair Mobility    Modified Rankin (Stroke Patients Only)       Balance Overall balance assessment: Modified Independent                                           Pertinent Vitals/Pain Pain Assessment: (near baseline pain (back), still with mild abdominal pain)    Home Living Family/patient expects to be discharged to:: Private residence Living Arrangements: Spouse/significant other;Children;Other relatives Available Help at Discharge: Family(someone is there 24/7) Type of Home: House Home Access: Level entry     Home Layout: One level Home Equipment: Walker - 2 wheels;Cane - single point(states BSC recently broke)      Prior Function Level of Independence: Needs assistance         Comments: Pt does not do house work or cooking, family helps with bathing     Hand Dominance        Extremity/Trunk Assessment   Upper Extremity Assessment Upper Extremity Assessment: Overall WFL for tasks assessed;Generalized weakness    Lower Extremity Assessment Lower Extremity Assessment: Overall WFL for tasks assessed;Generalized weakness       Communication   Communication: No  difficulties  Cognition Arousal/Alertness: Awake/alert Behavior During Therapy: WFL for tasks assessed/performed Overall Cognitive Status: Within Functional Limits for tasks assessed                                        General Comments      Exercises     Assessment/Plan    PT Assessment Patent does not need any further PT services  PT Problem List         PT Treatment Interventions      PT Goals (Current goals can be found in the Care Plan section)  Acute Rehab PT Goals Patient Stated Goal: go home tomorrow PT Goal Formulation: All assessment and education complete, DC therapy    Frequency     Barriers  to discharge        Co-evaluation               AM-PAC PT "6 Clicks" Mobility  Outcome Measure Help needed turning from your back to your side while in a flat bed without using bedrails?: None Help needed moving from lying on your back to sitting on the side of a flat bed without using bedrails?: None Help needed moving to and from a bed to a chair (including a wheelchair)?: None Help needed standing up from a chair using your arms (e.g., wheelchair or bedside chair)?: None Help needed to walk in hospital room?: None Help needed climbing 3-5 steps with a railing? : None 6 Click Score: 24    End of Session Equipment Utilized During Treatment: Gait belt Activity Tolerance: Patient tolerated treatment well Patient left: in chair;with call bell/phone within reach Nurse Communication: Mobility status PT Visit Diagnosis: Muscle weakness (generalized) (M62.81);Difficulty in walking, not elsewhere classified (R26.2)    Time: 9024-0973 PT Time Calculation (min) (ACUTE ONLY): 17 min   Charges:   PT Evaluation $PT Eval Low Complexity: 1 Low          Kreg Shropshire, DPT 05/27/2018, 3:55 PM

## 2018-05-27 NOTE — Progress Notes (Signed)
Pharmacy Electrolyte Monitoring Consult:  Pharmacy consulted to assist in monitoring and replacing electrolytes in this 73 y.o. female admitted on 05/25/2018 with Numbness and Dizziness  Labs:  Sodium (mmol/L)  Date Value  05/27/2018 138   Potassium (mmol/L)  Date Value  05/27/2018 3.2 (L)   Magnesium (mg/dL)  Date Value  05/27/2018 2.4   Phosphorus (mg/dL)  Date Value  05/26/2018 4.0   Calcium (mg/dL)  Date Value  05/27/2018 8.6 (L)   Albumin (g/dL)  Date Value  05/26/2018 3.7    Assessment/Plan: 1/30 K: 3.2. Will replace with KCL 87mEq PO x 1 dose. .   Will obtain follow-up electrolytes with am labs.    Pharmacy will continue to monitor and adjust per consult.   Pernell Dupre, PharmD, BCPS Clinical Pharmacist 05/27/2018 7:48 AM

## 2018-05-27 NOTE — Progress Notes (Signed)
Inpatient Diabetes Program Recommendations  AACE/ADA: New Consensus Statement on Inpatient Glycemic Control  Target Ranges:  Prepandial:   less than 140 mg/dL      Peak postprandial:   less than 180 mg/dL (1-2 hours)      Critically ill patients:  140 - 180 mg/dL   Results for Tina Hayes, Tina Hayes (MRN [id number]) as of 05/27/2018 12:21  Ref. Range 05/26/2018 08:04 05/26/2018 11:59 05/26/2018 16:31 05/26/2018 20:58 05/27/2018 07:49 05/27/2018 12:04  Glucose-Capillary Latest Ref Range: 70 - 99 mg/dL 144 (H) 289 (H) 313 (H) 217 (H) 169 (H) 311 (H)   Review of Glycemic Control  Diabetes history: DM2 Outpatient Diabetes medications: Lantus 30 units QAM, Lantus 40 units QHS, Novolog 10 units TID with meals Current orders for Inpatient glycemic control: Lantus 10 units BID, Novolog 0-9 units TID with meals  Inpatient Diabetes Program Recommendations:   Insulin - Basal: Please consider increasing morning dose of Lantus to 15 units QAM and continue Lantus 10 units QHS.  Correction (SSI): Please consider adding Novolog 0-5 units QHS for bedtime correction.  Insulin - Meal Coverage: Please consider ordering Novolog 6 units TID with meals for meal coverage if patient eats at least 50% of meals.   Thanks, Barnie Alderman, RN, MSN, CDE Diabetes Coordinator Inpatient Diabetes Program [phone] (Team Pager from 8am to 5pm)

## 2018-05-27 NOTE — Plan of Care (Signed)
  Problem: Health Behavior/Discharge Planning: Goal: Ability to manage health-related needs will improve Outcome: Progressing   Problem: Clinical Measurements: Goal: Will remain free from infection Outcome: Progressing Goal: Respiratory complications will improve Outcome: Progressing   Problem: Activity: Goal: Risk for activity intolerance will decrease Outcome: Progressing   Problem: Nutrition: Goal: Adequate nutrition will be maintained Outcome: Progressing   Problem: Coping: Goal: Level of anxiety will decrease Outcome: Progressing   Problem: Elimination: Goal: Will not experience complications related to bowel motility Outcome: Progressing   Problem: Safety: Goal: Ability to remain free from injury will improve Outcome: Progressing

## 2018-05-27 NOTE — Plan of Care (Signed)

## 2018-05-27 NOTE — Progress Notes (Deleted)
PHARMACIST - PHYSICIAN COMMUNICATION  CONCERNING:  Enoxaparin (Lovenox) for DVT Prophylaxis    RECOMMENDATION: Patient was prescribed enoxaprin 40mg  q24 hours for VTE prophylaxis.   Filed Weights   05/25/18 1553 05/25/18 2112  Weight: (!) 303 lb (137.4 kg) 289 lb 1.6 oz (131.1 kg)    Body mass index is 40.32 kg/m.  Estimated Creatinine Clearance: 70 mL/min (A) (by C-G formula based on SCr of 1.12 mg/dL (H)).   Based on [place] patient is candidate for enoxaparin 40mg  every 12 hour dosing due to BMI being >40.  DESCRIPTION: Pharmacy has adjusted enoxaparin dose per [facility] policy.  Patient is now receiving enoxaparin 40mg  every 12 hours.   Pernell Dupre, PharmD, BCPS Clinical Pharmacist 05/27/2018 8:13 AM

## 2018-05-27 NOTE — Progress Notes (Signed)
[facility] NAME: Tina Hayes    MR#:  [id number]  DATE OF BIRTH:  10-Feb-1946  SUBJECTIVE:  CHIEF COMPLAINT:   Chief Complaint  Patient presents with  . Numbness  . Dizziness  feels better, would like to eat and willing to ambulate REVIEW OF SYSTEMS:  Review of Systems  Constitutional: Positive for malaise/fatigue. Negative for diaphoresis, fever and weight loss.  HENT: Negative for ear discharge, ear pain, hearing loss, nosebleeds, sore throat and tinnitus.   Eyes: Negative for blurred vision and pain.  Respiratory: Negative for cough, hemoptysis, shortness of breath and wheezing.   Cardiovascular: Negative for chest pain, palpitations, orthopnea and leg swelling.  Gastrointestinal: Negative for abdominal pain, blood in stool, constipation, heartburn, nausea and vomiting.  Genitourinary: Negative for dysuria, frequency and urgency.  Musculoskeletal: Negative for back pain and myalgias.  Skin: Negative for itching and rash.  Neurological: Negative for dizziness, tingling, tremors, focal weakness, seizures, weakness and headaches.  Psychiatric/Behavioral: Negative for depression. The patient is not nervous/anxious.    DRUG ALLERGIES:   Allergies  Allergen Reactions  . Aspirin Anaphylaxis   VITALS:  Blood pressure 138/69, pulse 66, temperature 98.4 F (36.9 C), temperature source Oral, resp. rate 16, height 5\' 11"  (1.803 m), weight 131.1 kg, SpO2 100 %. PHYSICAL EXAMINATION:  Physical Exam HENT:     Head: Normocephalic and atraumatic.  Eyes:     Conjunctiva/sclera: Conjunctivae normal.     Pupils: Pupils are equal, round, and reactive to light.  Neck:     Musculoskeletal: Normal range of motion and neck supple.     Thyroid: No thyromegaly.     Trachea: No tracheal deviation.  Cardiovascular:     Rate and Rhythm: Normal rate and regular rhythm.     Heart sounds: Normal heart sounds.  Pulmonary:     Effort:  Pulmonary effort is normal. No respiratory distress.     Breath sounds: Normal breath sounds. No wheezing.  Chest:     Chest wall: No tenderness.  Abdominal:     General: Bowel sounds are normal. There is no distension.     Palpations: Abdomen is soft.     Tenderness: There is no abdominal tenderness.  Musculoskeletal: Normal range of motion.  Skin:    General: Skin is warm and dry.     Findings: No rash.  Neurological:     Mental Status: She is alert and oriented to person, place, and time.     Cranial Nerves: No cranial nerve deficit.    LABORATORY PANEL:  Female CBC Recent Labs  Lab 05/27/18 0338  WBC 6.2  HGB 10.5*  HCT 33.5*  PLT 259   ------------------------------------------------------------------------------------------------------------------ Chemistries  Recent Labs  Lab 05/26/18 0509 05/27/18 0338  NA 135 138  K 3.0* 3.2*  CL 95* 100  CO2 29 28  GLUCOSE 130* 181*  BUN 51* 42*  CREATININE 1.80* 1.12*  CALCIUM 8.2* 8.6*  MG 2.8* 2.4  AST 27  --   ALT 28  --   ALKPHOS 116  --   BILITOT 1.0  --    RADIOLOGY:  No results found. ASSESSMENT AND PLAN:   #Acute kidney injury secondary to dehydration: Creat 1.8->1.12 -continue IV fluids Avoid nephrotoxins and monitor renal function closely  #Acute gastroenteritis GI panel neg - continue IV fluids, supportive treatment, advance diet Flu test is negative  #Dehydration with hypotension - continue IV fluids Hold antihypertensives and diuretics UA is neg  #  Hypokalemia Replete and recheck and check potassium magnesium in a.m.  #Insulin requiring diabetes mellitus - diabetic diet Sliding scale insulin - increasing morning dose of Lantus to 15 units QAM and continue Lantus 10 units QHS. - starting Novolog 6 units TID with meals for meal coverage  #COPD chronic no exacerbation continue nebulizer treatments with albuterol as needed and scheduled     All the records are reviewed and case  discussed with Care Management/Social Worker. Management plans discussed with the patient, nursing and they are in agreement.  CODE STATUS: Full Code  TOTAL TIME TAKING CARE OF THIS PATIENT: 35 minutes.   More than 50% of the time was spent in counseling/coordination of care: YES  POSSIBLE D/C IN 1 DAYS, DEPENDING ON CLINICAL CONDITION.   Max Sane M.D on 05/27/2018 at 1:32 PM  Between 7am to 6pm - Pager - [phone]  After 6pm go to [email or web address] - Proofreader  [facility] [place] [facility]  Office  [phone]  CC: Primary care physician; Herminio Commons, MD  Note: This dictation was prepared with Dragon dictation along with smaller phrase technology. Any transcriptional errors that result from this process are unintentional.

## 2018-05-27 NOTE — Care Management (Signed)
Patient needs a 3 in 1, she has one that is 71 years old and it broke.  I notified [facility] Corene Cornea that the patient needs a 3 in 1

## 2018-05-28 ENCOUNTER — Inpatient Hospital Stay: Payer: Medicare Other

## 2018-05-28 DIAGNOSIS — E860 Dehydration: Secondary | ICD-10-CM | POA: Diagnosis not present

## 2018-05-28 DIAGNOSIS — R42 Dizziness and giddiness: Secondary | ICD-10-CM | POA: Diagnosis not present

## 2018-05-28 LAB — CBC
HCT: 31 % — ABNORMAL LOW (ref 36.0–46.0)
Hemoglobin: 9.9 g/dL — ABNORMAL LOW (ref 12.0–15.0)
MCH: 27.7 pg (ref 26.0–34.0)
MCHC: 31.9 g/dL (ref 30.0–36.0)
MCV: 86.8 fL (ref 80.0–100.0)
Platelets: 238 10*3/uL (ref 150–400)
RBC: 3.57 MIL/uL — AB (ref 3.87–5.11)
RDW: 14.2 % (ref 11.5–15.5)
WBC: 7.2 10*3/uL (ref 4.0–10.5)
nRBC: 0 % (ref 0.0–0.2)

## 2018-05-28 LAB — BASIC METABOLIC PANEL
Anion gap: 8 (ref 5–15)
BUN: 27 mg/dL — ABNORMAL HIGH (ref 8–23)
CALCIUM: 8.6 mg/dL — AB (ref 8.9–10.3)
CO2: 27 mmol/L (ref 22–32)
Chloride: 103 mmol/L (ref 98–111)
Creatinine, Ser: 0.94 mg/dL (ref 0.44–1.00)
GFR calc Af Amer: >60 mL/min (ref >60)
GFR calc non Af Amer: >60 mL/min (ref >60)
Glucose, Bld: 183 mg/dL — ABNORMAL HIGH (ref 70–99)
Potassium: 3 mmol/L — ABNORMAL LOW (ref 3.5–5.1)
Sodium: 138 mmol/L (ref 135–145)

## 2018-05-28 LAB — GLUCOSE, CAPILLARY
GLUCOSE-CAPILLARY: 211 mg/dL — AB (ref 70–99)
Glucose-Capillary: 151 mg/dL — ABNORMAL HIGH (ref 70–99)

## 2018-05-28 LAB — MAGNESIUM: Magnesium: 2.1 mg/dL (ref 1.7–2.4)

## 2018-05-28 MED ORDER — POTASSIUM CHLORIDE CRYS ER 20 MEQ PO TBCR
40.0000 meq | EXTENDED_RELEASE_TABLET | ORAL | Status: AC
  Administered 2018-05-28: 08:00:00 40 meq via ORAL
  Filled 2018-05-28: qty 2

## 2018-05-28 NOTE — Care Management Important Message (Signed)
Copy of signed Medicare IM left with patient in room.  While in room, patient requested housing and transportation assistance.  Number and information left for [employer] and [employer].  [place] as Theatre manager for housing.

## 2018-05-28 NOTE — Progress Notes (Signed)
Pt being discharged, discharge instructions reviewed with pt, states understanding, pt with no complaints

## 2018-05-28 NOTE — Progress Notes (Signed)
   05/28/18 1400  Clinical Encounter Type  Visited With Patient  Visit Type Follow-up  Spiritual Encounters  Spiritual Needs Prayer  Pt was waiting for her cab to return home. Pt requested a prayer. Ch prayed for pt's good health.

## 2018-05-28 NOTE — Discharge Instructions (Signed)

## 2018-05-28 NOTE — Progress Notes (Signed)
All home meds returned to patient prior to discharge

## 2018-05-29 NOTE — Discharge Summary (Signed)
[facility] NAME: Geneen Dieter    MR#:  [id number]  DATE OF BIRTH:  [birth year]  DATE OF ADMISSION:  05/25/2018   ADMITTING PHYSICIAN: Nicholes Mango, MD  DATE OF DISCHARGE: 05/28/2018  4:51 PM  PRIMARY CARE PHYSICIAN: Soles, Howell Rucks, MD   ADMISSION DIAGNOSIS:  Hypokalemia [E87.6] Weakness [R53.1] Influenza-like illness [R69] Hypotension, unspecified hypotension type [I95.9] DISCHARGE DIAGNOSIS:  Active Problems:   AKI (acute kidney injury) ([place])  SECONDARY DIAGNOSIS:   Past Medical History:  Diagnosis Date  . (HFpEF) heart failure with preserved ejection fraction ([place])    a. 05/2016 Echo: EF 60-65%, mild to mod LVH, Gr1 DD, mild MR, mildly dil LA, mod TR, mildly to mod increased PASP.  [name] Acute diastolic heart failure ([place]) 01/27/2017  . Anxiety   . Chest pain 06/16/2016  . CHF (congestive heart failure) ([place])   . Chronic back pain   . Chronic diastolic congestive heart failure ([place]) 02/13/2017  . COPD (chronic obstructive pulmonary disease) ([place])   . Coronary artery disease    a. s/p remote PCI x 5;  b. 2006 s/p CABG x 3 ([place], [place]); b. 05/2016 MV: attenuation corrected images w/o ischemia or wma-->Med rx.  . Coronary artery disease of native artery of native heart with stable angina pectoris ([place]) 06/17/2016  . Depression   . Diabetes mellitus without complication ([place])   . Essential hypertension 06/30/2016  . Heart attack ([place])    Total of 3 per pt.  . Hypertension   . Hypertensive urgency 06/03/2015  . Seizure ([place])   . Seizures ([place])    HOSPITAL COURSE:  73 y.o. female with a known history of insulin requiring diabetes mellitus, diastolic congestive heart failure, COPD, coronary artery disease, chronic back pain, anxiety and other medical problems admitted for nausea and vomiting for the past 5 days associated with diarrhea for the past 3 days and generalized abdominal pain  #Acute  kidney injury secondary to dehydration: Creat 1.8->1.12 -improved with IV fluids  #Acute gastroenteritis GI panel & c. Diff neg - improved with symptomatic mgmt Flu test is negative  #Dehydration with hypotension - resolved with hydration.  #Hypokalemia Repleted  #Insulin requiring diabetes mellitus - continue home regimen at D/C  #COPD chronic no exacerbation  DISCHARGE CONDITIONS:  stable CONSULTS OBTAINED:   DRUG ALLERGIES:   Allergies  Allergen Reactions  . Aspirin Anaphylaxis   DISCHARGE MEDICATIONS:   Allergies as of 05/28/2018      Reactions   Aspirin Anaphylaxis      Medication List    TAKE these medications   albuterol (2.5 MG/3ML) 0.083% nebulizer solution Commonly known as:  PROVENTIL Take 2.5 mg every 4 (four) hours as needed by nebulization for wheezing or shortness of breath.   albuterol 108 (90 Base) MCG/ACT inhaler Commonly known as:  PROVENTIL HFA;VENTOLIN HFA Inhale 2 puffs into the lungs every 6 (six) hours as needed for wheezing or shortness of breath.   amLODipine 10 MG tablet Commonly known as:  NORVASC Take 10 mg by mouth daily.   atorvastatin 40 MG tablet Commonly known as:  LIPITOR Take 40 mg at bedtime by mouth.   CALCIUM 600-D 600-400 MG-UNIT Tabs Generic drug:  Calcium Carbonate-Vitamin D3 Take 1 tablet 2 (two) times daily by mouth.   carvedilol 3.125 MG tablet Commonly known as:  COREG Take 1 tablet (3.125 mg total) by mouth 2 (two) times daily with a meal.  cetirizine 10 MG tablet Commonly known as:  ZYRTEC Take 10 mg by mouth daily.   citalopram 20 MG tablet Commonly known as:  CELEXA Take 20 mg by mouth at bedtime.   clopidogrel 75 MG tablet Commonly known as:  PLAVIX Take 1 tablet (75 mg total) by mouth daily.   famotidine 20 MG tablet Commonly known as:  PEPCID Take 40 mg by mouth Nightly.   HYDROcodone-acetaminophen 5-325 MG tablet Commonly known as:  NORCO/VICODIN Take 1 tablet by mouth every 6  (six) hours as needed for moderate pain or severe pain.   insulin aspart 100 UNIT/ML injection Commonly known as:  novoLOG Inject 10 Units into the skin 3 (three) times daily.   insulin glargine 100 UNIT/ML injection Commonly known as:  LANTUS Inject 30-40 Units into the skin See admin instructions. Inject 30 units in the morning at 40 units at bedtime.   isosorbide mononitrate 30 MG 24 hr tablet Commonly known as:  IMDUR Take 1 tablet (30 mg total) by mouth 2 (two) times daily.   levETIRAcetam 1000 MG tablet Commonly known as:  KEPPRA Take 1 tablet (1,000 mg total) by mouth 2 (two) times daily.   losartan 100 MG tablet Commonly known as:  COZAAR Take 100 mg by mouth daily.   metolazone 2.5 MG tablet Commonly known as:  ZAROXOLYN Take 1 tablet (2.5 mg total) by mouth daily. Take 1/2 hour prior to torsemide   montelukast 10 MG tablet Commonly known as:  SINGULAIR Take 10 mg by mouth daily.   nitroGLYCERIN 0.4 MG SL tablet Commonly known as:  NITROSTAT DISSOLVE ONE TABLET UNDER THE TONGUE EVERY 5 MINUTES AS NEEDED FOR CHEST PAIN.  DO NOT EXCEED A TOTAL OF 3 DOSES IN 15 MINUTES What changed:  See the new instructions.   potassium chloride SA 20 MEQ tablet Commonly known as:  K-DUR,KLOR-CON Take 2 tablets (40 mEq total) by mouth 3 (three) times daily. Take extra 2 tablets (40 meq) if you take metolazone What changed:  when to take this   ranitidine 300 MG tablet Commonly known as:  ZANTAC Take 300 mg by mouth at bedtime.   tiotropium 18 MCG inhalation capsule Commonly known as:  SPIRIVA Place 18 mcg into inhaler and inhale daily.   torsemide 20 MG tablet Commonly known as:  DEMADEX Take 2 tablets (40 mg total) by mouth 2 (two) times daily. What changed:  how much to take   traZODone 50 MG tablet Commonly known as:  DESYREL Take 50 mg by mouth at bedtime.        DISCHARGE INSTRUCTIONS:   DIET:  Regular diet DISCHARGE CONDITION:  Good ACTIVITY:  Activity  as tolerated OXYGEN:  Home Oxygen: No.  Oxygen Delivery: room air DISCHARGE LOCATION:  home   If you experience worsening of your admission symptoms, develop shortness of breath, life threatening emergency, suicidal or homicidal thoughts you must seek medical attention immediately by calling 911 or calling your MD immediately  if symptoms less severe.  You Must read complete instructions/literature along with all the possible adverse reactions/side effects for all the Medicines you take and that have been prescribed to you. Take any new Medicines after you have completely understood and accpet all the possible adverse reactions/side effects.   Please note  You were cared for by a hospitalist during your hospital stay. If you have any questions about your discharge medications or the care you received while you were in the hospital after you are discharged, you can  call the unit and asked to speak with the hospitalist on call if the hospitalist that took care of you is not available. Once you are discharged, your primary care physician will handle any further medical issues. Please note that NO REFILLS for any discharge medications will be authorized once you are discharged, as it is imperative that you return to your primary care physician (or establish a relationship with a primary care physician if you do not have one) for your aftercare needs so that they can reassess your need for medications and monitor your lab values.    On the day of Discharge:  VITAL SIGNS:  Blood pressure (!) 142/62, pulse 60, temperature 98 F (36.7 C), temperature source Oral, resp. rate 18, height 5\' 11"  (1.803 m), weight 131.1 kg, SpO2 100 %. PHYSICAL EXAMINATION:  GENERAL:  73 y.o.-year-old patient lying in the bed with no acute distress.  EYES: Pupils equal, round, reactive to light and accommodation. No scleral icterus. Extraocular muscles intact.  HEENT: Head atraumatic, normocephalic. Oropharynx and  nasopharynx clear.  NECK:  Supple, no jugular venous distention. No thyroid enlargement, no tenderness.  LUNGS: Normal breath sounds bilaterally, no wheezing, rales,rhonchi or crepitation. No use of accessory muscles of respiration.  CARDIOVASCULAR: S1, S2 normal. No murmurs, rubs, or gallops.  ABDOMEN: Soft, non-tender, non-distended. Bowel sounds present. No organomegaly or mass.  EXTREMITIES: No pedal edema, cyanosis, or clubbing.  NEUROLOGIC: Cranial nerves II through XII are intact. Muscle strength 5/5 in all extremities. Sensation intact. Gait not checked.  PSYCHIATRIC: The patient is alert and oriented x 3.  SKIN: No obvious rash, lesion, or ulcer.  DATA REVIEW:   CBC Recent Labs  Lab 05/28/18 0436  WBC 7.2  HGB 9.9*  HCT 31.0*  PLT 238    Chemistries  Recent Labs  Lab 05/26/18 0509  05/28/18 0436 05/28/18 0816  NA 135   < > 138  --   K 3.0*   < > 3.0*  --   CL 95*   < > 103  --   CO2 29   < > 27  --   GLUCOSE 130*   < > 183*  --   BUN 51*   < > 27*  --   CREATININE 1.80*   < > 0.94  --   CALCIUM 8.2*   < > 8.6*  --   MG 2.8*   < >  --  2.1  AST 27  --   --   --   ALT 28  --   --   --   ALKPHOS 116  --   --   --   BILITOT 1.0  --   --   --    < > = values in this interval not displayed.     Microbiology Results  Results for orders placed or performed during the hospital encounter of 05/25/18  Respiratory Panel by PCR     Status: None   Collection Time: 05/25/18  8:23 AM  Result Value Ref Range Status   Adenovirus NOT DETECTED NOT DETECTED Final   Coronavirus 229E NOT DETECTED NOT DETECTED Final   Coronavirus HKU1 NOT DETECTED NOT DETECTED Final   Coronavirus NL63 NOT DETECTED NOT DETECTED Final   Coronavirus OC43 NOT DETECTED NOT DETECTED Final   Metapneumovirus NOT DETECTED NOT DETECTED Final   Rhinovirus / Enterovirus NOT DETECTED NOT DETECTED Final   Influenza A NOT DETECTED NOT DETECTED Final   Influenza B NOT DETECTED NOT DETECTED  Final    Parainfluenza Virus 1 NOT DETECTED NOT DETECTED Final   Parainfluenza Virus 2 NOT DETECTED NOT DETECTED Final   Parainfluenza Virus 3 NOT DETECTED NOT DETECTED Final   Parainfluenza Virus 4 NOT DETECTED NOT DETECTED Final   Respiratory Syncytial Virus NOT DETECTED NOT DETECTED Final   Bordetella pertussis NOT DETECTED NOT DETECTED Final   Chlamydophila pneumoniae NOT DETECTED NOT DETECTED Final   Mycoplasma pneumoniae NOT DETECTED NOT DETECTED Final    Comment: Performed at [facility], [place] [address], [place], [place] [address]  Gastrointestinal Panel by PCR , Stool     Status: None   Collection Time: 05/25/18  8:24 AM  Result Value Ref Range Status   Campylobacter species NOT DETECTED NOT DETECTED Final   Plesimonas shigelloides NOT DETECTED NOT DETECTED Final   Salmonella species NOT DETECTED NOT DETECTED Final   Yersinia enterocolitica NOT DETECTED NOT DETECTED Final   Vibrio species NOT DETECTED NOT DETECTED Final   Vibrio cholerae NOT DETECTED NOT DETECTED Final   Enteroaggregative E coli (EAEC) NOT DETECTED NOT DETECTED Final   Enteropathogenic E coli (EPEC) NOT DETECTED NOT DETECTED Final   Enterotoxigenic E coli (ETEC) NOT DETECTED NOT DETECTED Final   Shiga like toxin producing E coli (STEC) NOT DETECTED NOT DETECTED Final   Shigella/Enteroinvasive E coli (EIEC) NOT DETECTED NOT DETECTED Final   Cryptosporidium NOT DETECTED NOT DETECTED Final   Cyclospora cayetanensis NOT DETECTED NOT DETECTED Final   Entamoeba histolytica NOT DETECTED NOT DETECTED Final   Giardia lamblia NOT DETECTED NOT DETECTED Final   Adenovirus F40/41 NOT DETECTED NOT DETECTED Final   Astrovirus NOT DETECTED NOT DETECTED Final   Norovirus GI/GII NOT DETECTED NOT DETECTED Final   Rotavirus A NOT DETECTED NOT DETECTED Final   Sapovirus (I, II, IV, and V) NOT DETECTED NOT DETECTED Final    Comment: Performed at [facility], [address] [place], [place] [address]  C difficile quick  scan w PCR reflex     Status: None   Collection Time: 05/26/18  8:24 AM  Result Value Ref Range Status   C Diff antigen NEGATIVE NEGATIVE Final   C Diff toxin NEGATIVE NEGATIVE Final   C Diff interpretation No C. difficile detected.  Final    Comment: Performed at [facility], [address] [place], [place] [address]    Follow-up Information    Revelo, Elyse Jarvis, MD. Go on 05/31/2018.   Specialty:  Family Medicine Why:  @10 :20 AM Contact information: [address] [place] [address] [phone]            Management plans discussed with the patient, family and they are in agreement.  CODE STATUS: Prior   TOTAL TIME TAKING CARE OF THIS PATIENT: 45 minutes.    Max Sane M.D on 05/29/2018 at 6:59 PM  Between 7am to 6pm - Pager - [phone]  After 6pm go to [email or web address] - Proofreader  [facility]  Office  [phone]  CC: Primary care physician; Herminio Commons, MD   Note: This dictation was prepared with Dragon dictation along with smaller phrase technology. Any transcriptional errors that result from this process are unintentional.

## 2018-06-02 ENCOUNTER — Ambulatory Visit: Payer: Medicare HMO | Admitting: Family

## 2018-06-07 ENCOUNTER — Ambulatory Visit: Payer: Medicare HMO | Admitting: Family

## 2018-06-11 ENCOUNTER — Encounter: Payer: Self-pay | Admitting: Family

## 2018-06-11 ENCOUNTER — Ambulatory Visit: Payer: Medicare HMO | Attending: Family | Admitting: Family

## 2018-06-11 VITALS — BP 92/60 | HR 69 | Resp 18 | Ht 71.0 in | Wt 302.4 lb

## 2018-06-11 DIAGNOSIS — I5032 Chronic diastolic (congestive) heart failure: Secondary | ICD-10-CM | POA: Insufficient documentation

## 2018-06-11 DIAGNOSIS — I252 Old myocardial infarction: Secondary | ICD-10-CM | POA: Diagnosis not present

## 2018-06-11 DIAGNOSIS — J449 Chronic obstructive pulmonary disease, unspecified: Secondary | ICD-10-CM | POA: Insufficient documentation

## 2018-06-11 DIAGNOSIS — Z818 Family history of other mental and behavioral disorders: Secondary | ICD-10-CM | POA: Diagnosis not present

## 2018-06-11 DIAGNOSIS — Z955 Presence of coronary angioplasty implant and graft: Secondary | ICD-10-CM | POA: Insufficient documentation

## 2018-06-11 DIAGNOSIS — Z6841 Body Mass Index (BMI) 40.0 and over, adult: Secondary | ICD-10-CM | POA: Insufficient documentation

## 2018-06-11 DIAGNOSIS — F419 Anxiety disorder, unspecified: Secondary | ICD-10-CM | POA: Insufficient documentation

## 2018-06-11 DIAGNOSIS — R569 Unspecified convulsions: Secondary | ICD-10-CM | POA: Diagnosis not present

## 2018-06-11 DIAGNOSIS — I10 Essential (primary) hypertension: Secondary | ICD-10-CM

## 2018-06-11 DIAGNOSIS — E119 Type 2 diabetes mellitus without complications: Secondary | ICD-10-CM | POA: Diagnosis not present

## 2018-06-11 DIAGNOSIS — M549 Dorsalgia, unspecified: Secondary | ICD-10-CM | POA: Diagnosis not present

## 2018-06-11 DIAGNOSIS — Z7902 Long term (current) use of antithrombotics/antiplatelets: Secondary | ICD-10-CM | POA: Insufficient documentation

## 2018-06-11 DIAGNOSIS — Z794 Long term (current) use of insulin: Secondary | ICD-10-CM | POA: Insufficient documentation

## 2018-06-11 DIAGNOSIS — Z951 Presence of aortocoronary bypass graft: Secondary | ICD-10-CM | POA: Diagnosis not present

## 2018-06-11 DIAGNOSIS — G8929 Other chronic pain: Secondary | ICD-10-CM | POA: Insufficient documentation

## 2018-06-11 DIAGNOSIS — Z79899 Other long term (current) drug therapy: Secondary | ICD-10-CM | POA: Diagnosis not present

## 2018-06-11 DIAGNOSIS — Z87891 Personal history of nicotine dependence: Secondary | ICD-10-CM | POA: Insufficient documentation

## 2018-06-11 DIAGNOSIS — Z833 Family history of diabetes mellitus: Secondary | ICD-10-CM | POA: Insufficient documentation

## 2018-06-11 DIAGNOSIS — I110 Hypertensive heart disease with heart failure: Secondary | ICD-10-CM | POA: Insufficient documentation

## 2018-06-11 DIAGNOSIS — Z8249 Family history of ischemic heart disease and other diseases of the circulatory system: Secondary | ICD-10-CM | POA: Insufficient documentation

## 2018-06-11 DIAGNOSIS — I25118 Atherosclerotic heart disease of native coronary artery with other forms of angina pectoris: Secondary | ICD-10-CM | POA: Diagnosis not present

## 2018-06-11 DIAGNOSIS — Z886 Allergy status to analgesic agent status: Secondary | ICD-10-CM | POA: Diagnosis not present

## 2018-06-11 DIAGNOSIS — F329 Major depressive disorder, single episode, unspecified: Secondary | ICD-10-CM | POA: Insufficient documentation

## 2018-06-11 DIAGNOSIS — E669 Obesity, unspecified: Secondary | ICD-10-CM | POA: Diagnosis not present

## 2018-06-11 NOTE — Progress Notes (Addendum)
[facility] NOTE   ASSESSMENT   Patient is fairly adherent to medication regimen, but occasionally does have trouble with transportation to get meds.  Most recent case is going without her torsemide for 1 week which is discussed below.  Patient has room in regimen to push doses towards target doses.   Adherence assessment   Patient is using pill case(fills each day) as an adherence strategy.   Do you ever forget to take your medication? [] Yes (1) [x] No (0)  Do you ever skip doses due to side effects? [] Yes (1) [x] No (0)  Do you have trouble affording your medicines? [] Yes (1) [x] No (0)  Are you ever unable to pick up your medication due to transportation difficulties? [x] Yes (1) [] No (0)  Do you ever stop taking your medications because you don't believe they are helping? [] Yes (1) [x] No (0)  Total score _1______      Guideline-Directed Medical Therapy/Evidence Based Medicine   ACE/ARB/ARNI: yes   Beta Blocker: yes   Aldosterone Antagonist: no Diuretic: yes   Drug related problem 1:  Patient was without torsemide for 1 week and weight went from baseline 297 to ~ 315   Drug related problem 2:  Patient HR 69 - BP (92/60) - current carvedilol dose 3.125mg      PLAN   Drug related problem 1: Patient acknowledged she should not have let herself run out of the torsemide and has been back on the torsemide for 1 week and weight is almost back down to baseline (302 today)   Drug related problem 2: Considering increasing Carvedilol dose to 6.25mg  (target 50mg  bid) - addendum - BP 92/60 - not advising Carvedilol increase at this time    SUBJECTIVE   HPI:  As above   Past Medical History:  Diagnosis Date  . (HFpEF) heart failure with preserved ejection fraction ([place])    a. 05/2016 Echo: EF 60-65%, mild to mod LVH, Gr1 DD, mild MR, mildly dil LA, mod TR, mildly to mod increased PASP.  [name] Acute diastolic heart  failure ([place]) 01/27/2017  . Anxiety   . Chest pain 06/16/2016  . CHF (congestive heart failure) ([place])   . Chronic back pain   . Chronic diastolic congestive heart failure ([place]) 02/13/2017  . COPD (chronic obstructive pulmonary disease) ([place])   . Coronary artery disease    a. s/p remote PCI x 5;  b. 2006 s/p CABG x 3 ([place], [place]); b. 05/2016 MV: attenuation corrected images w/o ischemia or wma-->Med rx.  . Coronary artery disease of native artery of native heart with stable angina pectoris ([place]) 06/17/2016  . Depression   . Diabetes mellitus without complication ([place])   . Essential hypertension 06/30/2016  . Heart attack ([place])    Total of 3 per pt.  . Hypertension   . Hypertensive urgency 06/03/2015  . Seizure ([place])   . Seizures ([facility])       Current Outpatient Medications:  .  albuterol (PROVENTIL HFA;VENTOLIN HFA) 108 (90 Base) MCG/ACT inhaler, Inhale 2 puffs into the lungs every 6 (six) hours as needed for wheezing or shortness of breath., Disp: 1 Inhaler, Rfl: 2 .  albuterol (PROVENTIL) (2.5 MG/3ML) 0.083% nebulizer solution, Take 2.5 mg every 4 (four) hours as needed by nebulization for wheezing or shortness of breath., Disp: , Rfl:  .  amLODipine (NORVASC) 10 MG tablet, Take 10 mg by mouth daily., Disp: , Rfl:  .  atorvastatin (  LIPITOR) 40 MG tablet, Take 40 mg at bedtime by mouth. , Disp: , Rfl:  .  Calcium Carbonate-Vitamin D3 (CALCIUM 600-D) 600-400 MG-UNIT TABS, Take 1 tablet 2 (two) times daily by mouth., Disp: , Rfl:  .  carvedilol (COREG) 3.125 MG tablet, Take 1 tablet (3.125 mg total) by mouth 2 (two) times daily with a meal., Disp: 180 tablet, Rfl: 3 .  cetirizine (ZYRTEC) 10 MG tablet, Take 10 mg by mouth daily., Disp: , Rfl:  .  citalopram (CELEXA) 20 MG tablet, Take 20 mg by mouth at bedtime. , Disp: , Rfl:  .  clopidogrel (PLAVIX) 75 MG tablet, Take 1 tablet (75 mg total) by mouth daily., Disp: 30 tablet, Rfl: 0 .  famotidine (PEPCID) 20 MG  tablet, Take 40 mg by mouth Nightly., Disp: , Rfl:  .  HYDROcodone-acetaminophen (NORCO/VICODIN) 5-325 MG tablet, Take 1 tablet by mouth every 6 (six) hours as needed for moderate pain or severe pain., Disp: 90 tablet, Rfl: 0 .  insulin aspart (NOVOLOG) 100 UNIT/ML injection, Inject 10 Units into the skin 3 (three) times daily. , Disp: , Rfl:  .  insulin glargine (LANTUS) 100 UNIT/ML injection, Inject 30-40 Units into the skin See admin instructions. Inject 30 units in the morning at 40 units at bedtime., Disp: , Rfl:  .  isosorbide mononitrate (IMDUR) 30 MG 24 hr tablet, Take 1 tablet (30 mg total) by mouth 2 (two) times daily., Disp: 180 tablet, Rfl: 3 .  levETIRAcetam (KEPPRA) 1000 MG tablet, Take 1 tablet (1,000 mg total) by mouth 2 (two) times daily., Disp: 60 tablet, Rfl: 0 .  losartan (COZAAR) 100 MG tablet, Take 100 mg by mouth daily., Disp: , Rfl:  .  metolazone (ZAROXOLYN) 2.5 MG tablet, Take 1 tablet (2.5 mg total) by mouth daily. Take 1/2 hour prior to torsemide (Patient not taking: Reported on 06/11/2018), Disp: 3 tablet, Rfl: 0 .  montelukast (SINGULAIR) 10 MG tablet, Take 10 mg by mouth daily. , Disp: , Rfl:  .  nitroGLYCERIN (NITROSTAT) 0.4 MG SL tablet, DISSOLVE ONE TABLET UNDER THE TONGUE EVERY 5 MINUTES AS NEEDED FOR CHEST PAIN.  DO NOT EXCEED A TOTAL OF 3 DOSES IN 15 MINUTES (Patient taking differently: Place 0.4 mg under the tongue every 5 (five) minutes as needed for chest pain. ), Disp: 25 tablet, Rfl: 0 .  potassium chloride SA (K-DUR,KLOR-CON) 20 MEQ tablet, Take 2 tablets (40 mEq total) by mouth 3 (three) times daily. Take extra 2 tablets (40 meq) if you take metolazone (Patient taking differently: Take 40 mEq by mouth 2 (two) times daily. Take extra 2 tablets (40 meq) if you take metolazone), Disp: 120 tablet, Rfl: 3 .  ranitidine (ZANTAC) 300 MG tablet, Take 300 mg by mouth at bedtime., Disp: , Rfl:  .  tiotropium (SPIRIVA) 18 MCG inhalation capsule, Place 18 mcg into inhaler  and inhale daily., Disp: , Rfl:  .  torsemide (DEMADEX) 20 MG tablet, Take 2 tablets (40 mg total) by mouth 2 (two) times daily. (Patient taking differently: Take 60 mg by mouth 2 (two) times daily. ), Disp: 180 tablet, Rfl: 0 .  traZODone (DESYREL) 50 MG tablet, Take 50 mg by mouth at bedtime., Disp: , Rfl:     OBJECTIVE    BMP Latest Ref Rng & Units 05/28/2018 05/27/2018 05/26/2018  Glucose 70 - 99 mg/dL 183(H) 181(H) 130(H)  BUN 8 - 23 mg/dL 27(H) 42(H) 51(H)  Creatinine 0.44 - 1.00 mg/dL 0.94 1.12(H) 1.80(H)  Sodium 135 -  145 mmol/L 138 138 135  Potassium 3.5 - 5.1 mmol/L 3.0(L) 3.2(L) 3.0(L)  Chloride 98 - 111 mmol/L 103 100 95(L)  CO2 22 - 32 mmol/L 27 28 29   Calcium 8.9 - 10.3 mg/dL 8.6(L) 8.6(L) 8.2(L)    Vital signs: HR 69, BP 92/60, weight (pounds) 302.6  ECHO: Date 06/17/2016, EF 60-65%, notes  Doppler parameters are consistent with abnormal left ventricular relaxation (grade 1 diastolic dysfunction).    DRUGS TO AVOID IN HEART FAILURE  Drug or Class Mechanism  Analgesics . NSAIDs . COX-2 inhibitors . Glucocorticoids  Sodium and water retention, increased systemic vascular resistance, decreased response to diuretics   Diabetes Medications . Metformin . Thiazolidinediones o Rosiglitazone (Avandia) o Pioglitazone (Actos) . DPP4 Inhibitors o Saxagliptin (Onglyza) o Sitagliptin (Januvia)   Lactic acidosis Possible calcium channel blockade   Unknown  Antiarrhythmics . Class I  o Flecainide o Disopyramide . Class III o Sotalol . Other o Dronedarone  Negative inotrope, proarrhythmic   Proarrhythmic, beta blockade  Negative inotrope  Antihypertensives . Alpha Blockers o Doxazosin . Calcium Channel Blockers o Diltiazem o Verapamil o Nifedipine . Central Alpha Adrenergics o Moxonidine . Peripheral Vasodilators o Minoxidil  Increases renin and aldosterone  Negative inotrope    Possible sympathetic withdrawal  Unknown   Anti-infective . Itraconazole . Amphotericin B  Negative inotrope Unknown  Hematologic . Anagrelide . Cilostazol   Possible inhibition of PD IV Inhibition of PD III causing arrhythmias  Neurologic/Psychiatric . Stimulants . Anti-Seizure Drugs o Carbamazepine o Pregabalin . Antidepressants o Tricyclics o Citalopram . Parkinsons o Bromocriptine o Pergolide o Pramipexole . Antipsychotics o Clozapine . Antimigraine o Ergotamine o Methysergide . Appetite suppressants . Bipolar o Lithium  Peripheral alpha and beta agonist activity  Negative inotrope and chronotrope Calcium channel blockade  Negative inotrope, proarrhythmic Dose-dependent QT prolongation  Excessive serotonin activity/valvular damage Excessive serotonin activity/valvular damage Unknown  IgE mediated hypersensitivy, calcium channel blockade  Excessive serotonin activity/valvular damage Excessive serotonin activity/valvular damage Valvular damage  Direct myofibrillar degeneration, adrenergic stimulation  Antimalarials . Chloroquine . Hydroxychloroquine Intracellular inhibition of lysosomal enzymes  Urologic Agents . Alpha Blockers o Doxazosin o Prazosin o Tamsulosin o Terazosin  Increased renin and aldosterone  Adapted from Page RL, et al. "Drugs That May Cause or Exacerbate Heart Failure: A Scientific Statement from the [employer]." Circulation 2016; 578:I69-G29. DOI: 10.1161/CIR.0000000000000426  COUNSELING POINTS/CLINICAL PEARLS   Carvedilol (Goal: weight less than 85 kg is 25 mg BID, weight greater than 85 kg is 50 mg BID)  Patient should avoid activities requiring coordination until drug effects are realized, as drug may cause dizziness.  This drug may cause diarrhea, nausea, vomiting, arthralgia, back pain, myalgia, headache, vision disorder, erectile dysfunction, reduced libido, or fatigue.  Instruct patient to report signs/symptoms of adverse cardiovascular effects  such as hypotension (especially in elderly patients), arrhythmias, syncope, palpitations, angina, or edema.  Drug may mask symptoms of hypoglycemia. Advise diabetic patients to carefully monitor blood sugar levels.  Patient should take drug with food.  Advise patient against sudden discontinuation of drug.  Losartan (Goal: 150 mg once daily)  Warn female patient to avoid pregnancy and to report a pregnancy that occurs during therapy.  Side effects may include dizziness, upper respiratory infection, nasal congestion, and back pain.  Warn patient to avoid use of potassium supplements or potassium-containing salt substitutes unless they consult healthcare provider.   Torsemide  Side effects may include excessive urination.  Tell patient to report symptoms of ototoxicity.  Instruct patient to report lightheadedness or syncope.  Warn patient to avoid use of nonprescription NSAID products without first discussing it with their healthcare provider.   MEDICATION ADHERENCES TIPS AND STRATEGIES 1. Taking medication as prescribed improves patient outcomes in heart failure (reduces hospitalizations, improves symptoms, increases survival) 2. Side effects of medications can be managed by decreasing doses, switching agents, stopping drugs, or adding additional therapy. Please let someone in the [facility] know if you have having bothersome side effects so we can modify your regimen. Do not alter your medication regimen without talking to [place].  3. Medication reminders can help patients remember to take drugs on time. If you are missing or forgetting doses you can try linking behaviors, using pill boxes, or an electronic reminder like an alarm on your phone or an app. Some people can also get automated phone calls as medication reminders.   Time spent: 10 minutes  Lu Duffel, PharmD, BCPS Clinical Pharmacist 06/11/2018 9:02 AM

## 2018-06-11 NOTE — Progress Notes (Signed)
Patient ID: Tina Hayes, female    DOB: 08/19/1945, 73 y.o.   MRN: [id number]  HPI  Tina Hayes is a 73 y/o female with a history of seizures, HTN, cardiac stents, DM, depression, CAD, COPD, chronic back pain, anxiety, obesity, remote tobacco use and chronic heart failure.   Last echo was done 06/17/16 and showed an EF of 60-65% along with mild MR and moderate TR. Pulmonary systolic pressure was mildly to moderately elevated. Stress test done 06/18/16.  Admitted 05/25/2018 due to acute kidney injury due to dehydration after N/V for 5 days. Given IV fluids. GI panel and c diff were negative. Flu was negative. Discharged after 3 days.   She presents today for her follow-up visit with a chief complaint of minimal shortness of breath upon moderate exertion. She describes this as chronic in nature having been present for several years. She has associated fatigue, chest tightness, pedal edema, headaches, leg weakness, depression and chronic difficulty sleeping along with this. She denies dizziness, abdominal distention, palpitations, chest pain, cough or weight gain. She says that she was out of her torsemide for ~ 1 week and just was able to get it called in 4 days ago. Edema and shortness of breath have been improving since resumption of torsemide.   Past Medical History:  Diagnosis Date  . (HFpEF) heart failure with preserved ejection fraction ([place])    a. 05/2016 Echo: EF 60-65%, mild to mod LVH, Gr1 DD, mild MR, mildly dil LA, mod TR, mildly to mod increased PASP.  [name] Acute diastolic heart failure ([place]) 01/27/2017  . Anxiety   . Chest pain 06/16/2016  . CHF (congestive heart failure) ([place])   . Chronic back pain   . Chronic diastolic congestive heart failure ([place]) 02/13/2017  . COPD (chronic obstructive pulmonary disease) ([place])   . Coronary artery disease    a. s/p remote PCI x 5;  b. 2006 s/p CABG x 3 ([place], [place]); b. 05/2016 MV: attenuation corrected images w/o ischemia  or wma-->Med rx.  . Coronary artery disease of native artery of native heart with stable angina pectoris ([place]) 06/17/2016  . Depression   . Diabetes mellitus without complication ([place])   . Essential hypertension 06/30/2016  . Heart attack ([place])    Total of 3 per pt.  . Hypertension   . Hypertensive urgency 06/03/2015  . Seizure ([place])   . Seizures ([place])    Past Surgical History:  Procedure Laterality Date  . ABDOMINAL HYSTERECTOMY    . ANKLE SURGERY Right   . CATARACT EXTRACTION W/ INTRAOCULAR LENS  IMPLANT, BILATERAL    . COLONOSCOPY WITH PROPOFOL N/A 05/04/2017   Procedure: COLONOSCOPY WITH PROPOFOL;  Surgeon: [name], MD;  Location: [facility] ENDOSCOPY;  Service: Endoscopy;  Laterality: N/A;  . CORONARY ANGIOPLASTY    . CORONARY ARTERY BYPASS GRAFT     TRIPLE BYPASS  . ESOPHAGOGASTRODUODENOSCOPY (EGD) WITH PROPOFOL N/A 05/04/2017   Procedure: ESOPHAGOGASTRODUODENOSCOPY (EGD) WITH PROPOFOL;  Surgeon: [name], MD;  Location: [facility] ENDOSCOPY;  Service: Endoscopy;  Laterality: N/A;  . EXCISION MASS LOWER EXTREMETIES Right 03/02/2018   Procedure: EXCISION SOFT TISSUE MASS FROM MEDIAL ASPECT OF RIGHT ANKLE;  Surgeon: [name], MD;  Location: [facility] ORS;  Service: Orthopedics;  Laterality: Right;  . FRACTURE SURGERY    . KNEE SURGERY Right   . MOUTH SURGERY Left 07/14/2017   Family History  Problem Relation Age of Onset  . Diabetes Other   .  Hypertension Other   . Diabetes Mother   . Heart failure Mother   . Heart disease Mother   . Heart attack Mother   . Stroke Mother   . Depression Mother   . Hypertension Mother   . Cancer Sister        brain  . Hypertension Sister   . Diabetes Brother   . Hypertension Brother   . Heart failure Sister   . Heart attack Sister   . SIDS Sister    Social History   Tobacco Use  . Smoking status: Former Smoker    Packs/day: 0.25    Years: 20.00    Pack years: 5.00    Types: Cigarettes    Last attempt to quit: 07/01/2006     Years since quitting: 11.9  . Smokeless tobacco: Never Used  Substance Use Topics  . Alcohol use: No    Alcohol/week: 0.0 standard drinks   Allergies  Allergen Reactions  . Aspirin Anaphylaxis   Prior to Admission medications   Medication Sig Start Date End Date Taking? Authorizing Provider  albuterol (PROVENTIL HFA;VENTOLIN HFA) 108 (90 Base) MCG/ACT inhaler Inhale 2 puffs into the lungs every 6 (six) hours as needed for wheezing or shortness of breath. 08/30/15  Yes Epifanio Lesches, MD  albuterol (PROVENTIL) (2.5 MG/3ML) 0.083% nebulizer solution Take 2.5 mg every 4 (four) hours as needed by nebulization for wheezing or shortness of breath.   Yes [provider]  amLODipine (NORVASC) 10 MG tablet Take 10 mg by mouth daily. 11/12/17  Yes [provider]  atorvastatin (LIPITOR) 40 MG tablet Take 40 mg at bedtime by mouth.    Yes [provider]  Calcium Carbonate-Vitamin D3 (CALCIUM 600-D) 600-400 MG-UNIT TABS Take 1 tablet 2 (two) times daily by mouth.   Yes [provider]  carvedilol (COREG) 3.125 MG tablet Take 1 tablet (3.125 mg total) by mouth 2 (two) times daily with a meal. 08/12/16  Yes Hackney, Tina A, FNP  cetirizine (ZYRTEC) 10 MG tablet Take 10 mg by mouth daily.   Yes [provider]  citalopram (CELEXA) 20 MG tablet Take 20 mg by mouth at bedtime.    Yes [provider]  clopidogrel (PLAVIX) 75 MG tablet Take 1 tablet (75 mg total) by mouth daily. 08/14/16  Yes Gollan, Kathlene November, MD  HYDROcodone-acetaminophen (NORCO/VICODIN) 5-325 MG tablet Take 1 tablet by mouth every 6 (six) hours as needed for moderate pain or severe pain. 04/05/18  Yes Molli Barrows, MD  insulin aspart (NOVOLOG) 100 UNIT/ML injection Inject 10 Units into the skin 3 (three) times daily.    Yes [provider]  insulin glargine (LANTUS) 100 UNIT/ML injection Inject 30-40 Units into the skin See admin instructions. Inject 30 units in the morning at  40 units at bedtime.   Yes [provider]  isosorbide mononitrate (IMDUR) 30 MG 24 hr tablet Take 1 tablet (30 mg total) by mouth 2 (two) times daily. 08/12/16  Yes Darylene Price A, FNP  levETIRAcetam (KEPPRA) 1000 MG tablet Take 1 tablet (1,000 mg total) by mouth 2 (two) times daily. 11/12/17 01/28/19 Yes Pyreddy, Reatha Harps, MD  losartan (COZAAR) 100 MG tablet Take 100 mg by mouth daily.   Yes [provider]  montelukast (SINGULAIR) 10 MG tablet Take 10 mg by mouth daily.  11/05/17  Yes [provider]  nitroGLYCERIN (NITROSTAT) 0.4 MG SL tablet DISSOLVE ONE TABLET UNDER THE TONGUE EVERY 5 MINUTES AS NEEDED FOR CHEST PAIN.  DO  NOT EXCEED A TOTAL OF 3 DOSES IN 15 MINUTES Patient taking differently: Place 0.4 mg under the tongue every 5 (five) minutes as needed for chest pain.  07/30/17  Yes Gollan, Kathlene November, MD  potassium chloride SA (K-DUR,KLOR-CON) 20 MEQ tablet Take 2 tablets (40 mEq total) by mouth 3 (three) times daily. Take extra 2 tablets (40 meq) if you take metolazone Patient taking differently: Take 40 mEq by mouth 2 (two) times daily. Take extra 2 tablets (40 meq) if you take metolazone 11/26/17  Yes Darylene Price A, FNP  ranitidine (ZANTAC) 300 MG tablet Take 300 mg by mouth at bedtime.   Yes [provider]  tiotropium (SPIRIVA) 18 MCG inhalation capsule Place 18 mcg into inhaler and inhale daily.   Yes [provider]  torsemide (DEMADEX) 20 MG tablet Take 2 tablets (40 mg total) by mouth 2 (two) times daily. Patient taking differently: Take 60 mg by mouth 2 (two) times daily.  11/26/17 01/28/19 Yes Hackney, Otila Kluver A, FNP  traZODone (DESYREL) 50 MG tablet Take 50 mg by mouth at bedtime.   Yes [provider]  famotidine (PEPCID) 20 MG tablet Take 40 mg by mouth Nightly. 04/07/18   [provider]    Review of Systems  Constitutional: Positive for fatigue. Negative for appetite change.  HENT: Negative for congestion, postnasal drip and  trouble swallowing.   Eyes: Negative.   Respiratory: Positive for chest tightness (at times) and shortness of breath. Negative for cough.   Cardiovascular: Positive for leg swelling (improving). Negative for chest pain and palpitations.  Gastrointestinal: Negative for abdominal distention, abdominal pain and nausea.  Endocrine: Negative.   Genitourinary: Negative.   Musculoskeletal: Positive for arthralgias (left leg) and back pain (chronic).  Skin: Negative.   Allergic/Immunologic: Negative.   Neurological: Positive for weakness (legs), numbness (right leg) and headaches. Negative for dizziness and light-headedness.  Hematological: Negative for adenopathy. Does not bruise/bleed easily.  Psychiatric/Behavioral: Positive for dysphoric mood and sleep disturbance. Negative for suicidal ideas. The patient is nervous/anxious (worsening stress).    Vitals:   06/11/18 0903  BP: 92/60  Pulse: 69  Resp: 18  SpO2: 100%  Weight: (!) 302 lb 6 oz (137.2 kg)  Height: 5\' 11"  (1.803 m)   Wt Readings from Last 3 Encounters:  06/11/18 (!) 302 lb 6 oz (137.2 kg)  05/25/18 289 lb 1.6 oz (131.1 kg)  04/05/18 299 lb (135.6 kg)   Lab Results  Component Value Date   CREATININE 0.94 05/28/2018   CREATININE 1.12 (H) 05/27/2018   CREATININE 1.80 (H) 05/26/2018    Physical Exam  Constitutional: She is oriented to person, place, and time. She appears well-developed and well-nourished.  HENT:  Head: Normocephalic and atraumatic.  Neck: Normal range of motion. Neck supple. No JVD present.  Cardiovascular: Normal rate and regular rhythm.  Pulmonary/Chest: Effort normal. She has no wheezes. She has no rales.  Abdominal: Soft. She exhibits no distension. There is no abdominal tenderness.  Musculoskeletal:        General: Edema (trace edema around bilateral ankles) present. No tenderness.  Neurological: She is alert and oriented to person, place, and time.  Skin: Skin is warm and dry.  Psychiatric: She  has a normal mood and affect. Her behavior is normal. Thought content normal.  Nursing note and vitals reviewed.  Assessment & Plan:  1: Chronic heart failure with preserved ejection fraction- - NYHA class II - euvolemic today - weighing daily and says that her home  weight has gradually declined since resuming torsemide.  Reminded to call for an overnight weight gain of >2 pounds or a weekly weight gain of >5 pounds.  - instructed her to call us if there is an issue with getting her diuretic called in next time as she doesn't need to be without it for a week - weight down 1 pound from last visit 4 months ago - has not been adding salt to her foods and is trying to eat low sodium foods - trying to keep fluid intake closer to 60 ounces daily but admits that it's difficult to do - saw her cardiologist Rockey Situ) 11/09/17 - BNP 03/16/17 was 166.0 - PharmD reconciled medications with the patient - reports receiving her flu vaccine for this season  2: HTN- - BP on the low side - BMP from 06/08/2018 [facility]) reviewed; sodium 146, potassium 4.3, creatinine 1.13 and GFR 57 - sees PCP at the [facility]  3: Diabetes-  - fasting glucose at home yesterday was 127 - A1c on 05/27/2018 was 7.5%  Patient did not bring her medications nor a list. Each medication was verbally reviewed with the patient and she was encouraged to bring the bottles to every visit to confirm accuracy of list.  Return in 3 months or sooner for any questions/problems before then.

## 2018-06-11 NOTE — Patient Instructions (Signed)
Continue weighing daily and call for an overnight weight gain of > 2 pounds or a weekly weight gain of >5 pounds. 

## 2018-06-14 ENCOUNTER — Telehealth: Payer: Self-pay | Admitting: Family

## 2018-06-14 MED ORDER — METOLAZONE 2.5 MG PO TABS: 2.5000 mg | ORAL_TABLET | Freq: Every day | ORAL | 0 refills | Status: DC

## 2018-06-14 NOTE — Telephone Encounter (Signed)
Patient called to say that she has gained 4 pounds in the last 2 days and that her legs are more swollen.   She is currently taking torsemide 60mg  BID along with potassium 63meq BID.   Will add low dose metolazone 2.5mg  daily for the next 3 days. Reminded her to take it 1/2 hour before torsemide if possible. For these next 3 days, she needs to take an additional 77meq potassium each day. Patient verbalizes understanding.   Will see patient in clinic on 06/18/2018 and check BMP at that time.

## 2018-06-18 ENCOUNTER — Ambulatory Visit: Payer: Medicare HMO | Admitting: Family

## 2018-06-23 ENCOUNTER — Other Ambulatory Visit: Payer: Self-pay | Admitting: Cardiovascular Disease

## 2018-06-23 NOTE — Telephone Encounter (Signed)
Patient needs an appointment for further refills Message sent to scheduling

## 2018-06-25 ENCOUNTER — Telehealth: Payer: Self-pay | Admitting: Cardiovascular Disease

## 2018-06-25 ENCOUNTER — Encounter: Payer: Medicare HMO | Admitting: Anesthesiology

## 2018-06-25 NOTE — Telephone Encounter (Signed)
LMOV to schedule follow up 

## 2018-06-25 NOTE — Telephone Encounter (Signed)
-----   Message from [name], [place] sent at 06/23/2018  3:23 PM EST ----- Regarding: appointment for refills Patient needs an appointment for further refills. If patient does not want to schedule an appointment please make them aware to contact PCP for refills.   Patient was last seen 01/2017 and was too follow up in 6 months.    Thanks Ladies!

## 2018-06-29 NOTE — Progress Notes (Signed)
Patient ID: Tina Hayes, female    DOB: [birth year], 73 y.o.   MRN: [id number]  HPI  Tina Hayes is a 73 y/o female with a history of seizures, HTN, cardiac stents, DM, depression, CAD, COPD, chronic back pain, anxiety, obesity, remote tobacco use and chronic heart failure.   Last echo was done 06/17/16 and showed an EF of 60-65% along with mild MR and moderate TR. Pulmonary systolic pressure was mildly to moderately elevated. Stress test done 06/18/16.  Admitted 05/25/2018 due to acute kidney injury due to dehydration after N/V for 5 days. Given IV fluids. GI panel and c diff were negative. Flu was negative. Discharged after 3 days.   She presents today for her follow-up visit with a chief complaint of minimal shortness of breath upon moderate exertion. She describes this as chronic in nature having been present for several years. She has associated fatigue, pedal edema, headaches, left knee pain and chronic back pain along with this. She denies any difficulty sleeping, dizziness, abdominal distention, palpitations, chest pain, cough or weight gain. Getting ready to have her left knee replaced and meets with the surgeon tomorrow.   Past Medical History:  Diagnosis Date  . (HFpEF) heart failure with preserved ejection fraction ([place])    a. 05/2016 Echo: EF 60-65%, mild to mod LVH, Gr1 DD, mild MR, mildly dil LA, mod TR, mildly to mod increased PASP.  [name] Acute diastolic heart failure ([place]) 01/27/2017  . Anxiety   . Chest pain 06/16/2016  . CHF (congestive heart failure) ([place])   . Chronic back pain   . Chronic diastolic congestive heart failure ([place]) 02/13/2017  . COPD (chronic obstructive pulmonary disease) ([place])   . Coronary artery disease    a. s/p remote PCI x 5;  b. 2006 s/p CABG x 3 ([place], [place]); b. 05/2016 MV: attenuation corrected images w/o ischemia or wma-->Med rx.  . Coronary artery disease of native artery of native heart with stable angina pectoris ([place])  06/17/2016  . Depression   . Diabetes mellitus without complication ([place])   . Essential hypertension 06/30/2016  . Heart attack ([place])    Total of 3 per pt.  . Hypertension   . Hypertensive urgency 06/03/2015  . Seizure ([place])   . Seizures ([place])    Past Surgical History:  Procedure Laterality Date  . ABDOMINAL HYSTERECTOMY    . ANKLE SURGERY Right   . CATARACT EXTRACTION W/ INTRAOCULAR LENS  IMPLANT, BILATERAL    . COLONOSCOPY WITH PROPOFOL N/A 05/04/2017   Procedure: COLONOSCOPY WITH PROPOFOL;  Surgeon: [name], MD;  Location: [facility] ENDOSCOPY;  Service: Endoscopy;  Laterality: N/A;  . CORONARY ANGIOPLASTY    . CORONARY ARTERY BYPASS GRAFT     TRIPLE BYPASS  . ESOPHAGOGASTRODUODENOSCOPY (EGD) WITH PROPOFOL N/A 05/04/2017   Procedure: ESOPHAGOGASTRODUODENOSCOPY (EGD) WITH PROPOFOL;  Surgeon: [name], MD;  Location: [facility] ENDOSCOPY;  Service: Endoscopy;  Laterality: N/A;  . EXCISION MASS LOWER EXTREMETIES Right 03/02/2018   Procedure: EXCISION SOFT TISSUE MASS FROM MEDIAL ASPECT OF RIGHT ANKLE;  Surgeon: [name], MD;  Location: [facility] ORS;  Service: Orthopedics;  Laterality: Right;  . FRACTURE SURGERY    . KNEE SURGERY Right   . MOUTH SURGERY Left 07/14/2017   Family History  Problem Relation Age of Onset  . Diabetes Other   . Hypertension Other   . Diabetes Mother   . Heart failure Mother   . Heart disease Mother   .  Heart attack Mother   . Stroke Mother   . Depression Mother   . Hypertension Mother   . Cancer Sister        brain  . Hypertension Sister   . Diabetes Brother   . Hypertension Brother   . Heart failure Sister   . Heart attack Sister   . SIDS Sister    Social History   Tobacco Use  . Smoking status: Former Smoker    Packs/day: 0.25    Years: 20.00    Pack years: 5.00    Types: Cigarettes    Last attempt to quit: 07/01/2006    Years since quitting: 12.0  . Smokeless tobacco: Never Used  Substance Use Topics  . Alcohol use: No     Alcohol/week: 0.0 standard drinks   Allergies  Allergen Reactions  . Aspirin Anaphylaxis   Prior to Admission medications   Medication Sig Start Date End Date Taking? Authorizing Provider  albuterol (PROVENTIL HFA;VENTOLIN HFA) 108 (90 Base) MCG/ACT inhaler Inhale 2 puffs into the lungs every 6 (six) hours as needed for wheezing or shortness of breath. 08/30/15  Yes Epifanio Lesches, MD  albuterol (PROVENTIL) (2.5 MG/3ML) 0.083% nebulizer solution Take 2.5 mg every 4 (four) hours as needed by nebulization for wheezing or shortness of breath.   Yes [provider]  amLODipine (NORVASC) 10 MG tablet Take 10 mg by mouth daily. 11/12/17  Yes [provider]  atorvastatin (LIPITOR) 40 MG tablet Take 40 mg at bedtime by mouth.    Yes [provider]  Calcium Carbonate-Vitamin D3 (CALCIUM 600-D) 600-400 MG-UNIT TABS Take 1 tablet by mouth daily.    Yes [provider]  carvedilol (COREG) 3.125 MG tablet Take 1 tablet (3.125 mg total) by mouth 2 (two) times daily with a meal. 08/12/16  Yes Hackney, Tina A, FNP  cetirizine (ZYRTEC) 10 MG tablet Take 10 mg by mouth daily.   Yes [provider]  citalopram (CELEXA) 20 MG tablet Take 20 mg by mouth at bedtime.    Yes [provider]  clopidogrel (PLAVIX) 75 MG tablet Take 1 tablet (75 mg total) by mouth daily. 08/14/16  Yes Minna Merritts, MD  famotidine (PEPCID) 20 MG tablet Take 40 mg by mouth Nightly. 04/07/18  Yes [provider]  HYDROcodone-acetaminophen (NORCO/VICODIN) 5-325 MG tablet Take 1 tablet by mouth every 6 (six) hours as needed for moderate pain or severe pain. 04/05/18  Yes Molli Barrows, MD  insulin aspart (NOVOLOG) 100 UNIT/ML injection Inject 10 Units into the skin 3 (three) times daily.    Yes [provider]  insulin glargine (LANTUS) 100 UNIT/ML injection Inject 30-40 Units into the skin See admin instructions. Inject 30 units in the morning at 40 units at  bedtime.   Yes [provider]  isosorbide mononitrate (IMDUR) 30 MG 24 hr tablet Take 1 tablet (30 mg total) by mouth 2 (two) times daily. 08/12/16  Yes Darylene Price A, FNP  levETIRAcetam (KEPPRA) 1000 MG tablet Take 1 tablet (1,000 mg total) by mouth 2 (two) times daily. 11/12/17 01/28/19 Yes Pyreddy, Reatha Harps, MD  losartan (COZAAR) 100 MG tablet Take 100 mg by mouth daily.   Yes [provider]  montelukast (SINGULAIR) 10 MG tablet Take 10 mg by mouth daily.  11/05/17  Yes [provider]  nitroGLYCERIN (NITROSTAT) 0.4 MG SL tablet DISSOLVE ONE TABLET UNDER THE TONGUE EVERY 5 MINUTES AS NEEDED FOR CHEST PAIN.  DO NOT EXCEED A TOTAL OF 3  DOSES IN 15 MINUTES Patient taking differently: Place 0.4 mg under the tongue every 5 (five) minutes as needed for chest pain.  07/30/17  Yes Gollan, Kathlene November, MD  potassium chloride SA (K-DUR,KLOR-CON) 20 MEQ tablet Take 2 tablets (40 mEq total) by mouth 3 (three) times daily. Take extra 2 tablets (40 meq) if you take metolazone Patient taking differently: Take 40 mEq by mouth 2 (two) times daily. Take extra 2 tablets (40 meq) if you take metolazone 11/26/17  Yes Darylene Price A, FNP  tiotropium (SPIRIVA) 18 MCG inhalation capsule Place 18 mcg into inhaler and inhale daily.   Yes [provider]  torsemide (DEMADEX) 20 MG tablet Take 2 tablets (40 mg total) by mouth 2 (two) times daily. Patient taking differently: Take 60 mg by mouth 2 (two) times daily.  11/26/17 01/28/19 Yes Hackney, Otila Kluver A, FNP  traZODone (DESYREL) 50 MG tablet Take 50 mg by mouth at bedtime.   Yes [provider]  ranitidine (ZANTAC) 300 MG tablet Take 300 mg by mouth at bedtime.    [provider]    Review of Systems  Constitutional: Positive for fatigue. Negative for appetite change.  HENT: Negative for congestion, postnasal drip and trouble swallowing.   Eyes: Negative.   Respiratory: Positive for shortness of breath. Negative for cough and  chest tightness.   Cardiovascular: Positive for leg swelling (improving). Negative for chest pain and palpitations.  Gastrointestinal: Negative for abdominal distention, abdominal pain and nausea.  Endocrine: Negative.   Genitourinary: Negative.   Musculoskeletal: Positive for arthralgias (left knee) and back pain (chronic).  Skin: Negative.   Allergic/Immunologic: Negative.   Neurological: Positive for numbness (right leg) and headaches. Negative for dizziness, weakness and light-headedness.  Hematological: Negative for adenopathy. Does not bruise/bleed easily.  Psychiatric/Behavioral: Negative for dysphoric mood, sleep disturbance (sleeping on 3 pillows) and suicidal ideas. The patient is not nervous/anxious.    Vitals:   07/01/18 0907  BP: 108/60  Pulse: 60  Resp: 18  SpO2: 100%  Weight: (!) 302 lb 4 oz (137.1 kg)  Height: 5\' 11"  (1.803 m)   Wt Readings from Last 3 Encounters:  07/01/18 (!) 302 lb 4 oz (137.1 kg)  06/11/18 (!) 302 lb 6 oz (137.2 kg)  05/25/18 289 lb 1.6 oz (131.1 kg)   Lab Results  Component Value Date   CREATININE 0.94 05/28/2018   CREATININE 1.12 (H) 05/27/2018   CREATININE 1.80 (H) 05/26/2018    Physical Exam  Constitutional: She is oriented to person, place, and time. She appears well-developed and well-nourished.  HENT:  Head: Normocephalic and atraumatic.  Neck: Normal range of motion. Neck supple. No JVD present.  Cardiovascular: Normal rate and regular rhythm.  Pulmonary/Chest: Effort normal. She has no wheezes. She has no rales.  Abdominal: Soft. She exhibits no distension. There is no abdominal tenderness.  Musculoskeletal:        General: Edema (trace edema around left ankle) present. No tenderness.  Neurological: She is alert and oriented to person, place, and time.  Skin: Skin is warm and dry.  Psychiatric: She has a normal mood and affect. Her behavior is normal. Thought content normal.  Nursing note and vitals reviewed.  Assessment &  Plan:  1: Chronic heart failure with preserved ejection fraction- - NYHA class II - euvolemic today - weighing daily; Reminded to call for an overnight weight gain of >2 pounds or a weekly weight gain of >5 pounds.  - weight unchanged from here last visit here  3 weeks ago - has not been adding salt to her foods and is trying to eat low sodium foods - took 3 days of metolazone/ extra potassium since she was last here; edema in legs improved - will get BMP today - trying to keep fluid intake closer to 60 ounces daily  - saw her cardiologist Rockey Situ) 11/09/17 - BNP 03/16/17 was 166.0 - PharmD reconciled medications with the patient - reports receiving her flu vaccine for this season  2: HTN- - BP looks good today - BMP from 06/08/2018 [facility]) reviewed; sodium 146, potassium 4.3, creatinine 1.13 and GFR 57 - sees PCP at the [facility]  3: Diabetes-  - fasting glucose at home yesterday was 138 - A1c on 05/27/2018 was 7.5%  Patient did not bring her medications nor a list. Each medication was verbally reviewed with the patient and she was encouraged to bring the bottles to every visit to confirm accuracy of list.  Return in 2 months or sooner for any questions/problems before then.

## 2018-07-01 ENCOUNTER — Ambulatory Visit: Payer: Medicare HMO | Attending: Family | Admitting: Family

## 2018-07-01 ENCOUNTER — Encounter: Payer: Self-pay | Admitting: Pharmacist

## 2018-07-01 ENCOUNTER — Encounter: Payer: Self-pay | Admitting: Family

## 2018-07-01 VITALS — BP 108/60 | HR 60 | Resp 18 | Ht 71.0 in | Wt 302.2 lb

## 2018-07-01 DIAGNOSIS — M549 Dorsalgia, unspecified: Secondary | ICD-10-CM | POA: Diagnosis not present

## 2018-07-01 DIAGNOSIS — Z886 Allergy status to analgesic agent status: Secondary | ICD-10-CM | POA: Diagnosis not present

## 2018-07-01 DIAGNOSIS — I5032 Chronic diastolic (congestive) heart failure: Secondary | ICD-10-CM | POA: Diagnosis present

## 2018-07-01 DIAGNOSIS — Z833 Family history of diabetes mellitus: Secondary | ICD-10-CM | POA: Diagnosis not present

## 2018-07-01 DIAGNOSIS — Z823 Family history of stroke: Secondary | ICD-10-CM | POA: Insufficient documentation

## 2018-07-01 DIAGNOSIS — Z955 Presence of coronary angioplasty implant and graft: Secondary | ICD-10-CM | POA: Diagnosis not present

## 2018-07-01 DIAGNOSIS — Z79899 Other long term (current) drug therapy: Secondary | ICD-10-CM | POA: Insufficient documentation

## 2018-07-01 DIAGNOSIS — E119 Type 2 diabetes mellitus without complications: Secondary | ICD-10-CM | POA: Diagnosis not present

## 2018-07-01 DIAGNOSIS — Z87891 Personal history of nicotine dependence: Secondary | ICD-10-CM | POA: Insufficient documentation

## 2018-07-01 DIAGNOSIS — I2510 Atherosclerotic heart disease of native coronary artery without angina pectoris: Secondary | ICD-10-CM | POA: Insufficient documentation

## 2018-07-01 DIAGNOSIS — Z8249 Family history of ischemic heart disease and other diseases of the circulatory system: Secondary | ICD-10-CM | POA: Insufficient documentation

## 2018-07-01 DIAGNOSIS — I252 Old myocardial infarction: Secondary | ICD-10-CM | POA: Diagnosis not present

## 2018-07-01 DIAGNOSIS — G8929 Other chronic pain: Secondary | ICD-10-CM | POA: Diagnosis not present

## 2018-07-01 DIAGNOSIS — I110 Hypertensive heart disease with heart failure: Secondary | ICD-10-CM | POA: Insufficient documentation

## 2018-07-01 DIAGNOSIS — J449 Chronic obstructive pulmonary disease, unspecified: Secondary | ICD-10-CM | POA: Diagnosis not present

## 2018-07-01 DIAGNOSIS — I10 Essential (primary) hypertension: Secondary | ICD-10-CM

## 2018-07-01 DIAGNOSIS — E669 Obesity, unspecified: Secondary | ICD-10-CM | POA: Insufficient documentation

## 2018-07-01 DIAGNOSIS — Z794 Long term (current) use of insulin: Secondary | ICD-10-CM | POA: Insufficient documentation

## 2018-07-01 DIAGNOSIS — Z951 Presence of aortocoronary bypass graft: Secondary | ICD-10-CM | POA: Diagnosis not present

## 2018-07-01 LAB — BASIC METABOLIC PANEL
Anion gap: 10 (ref 5–15)
BUN: 15 mg/dL (ref 8–23)
CO2: 27 mmol/L (ref 22–32)
Calcium: 9.1 mg/dL (ref 8.9–10.3)
Chloride: 106 mmol/L (ref 98–111)
Creatinine, Ser: 1.02 mg/dL — ABNORMAL HIGH (ref 0.44–1.00)
GFR, EST NON AFRICAN AMERICAN: 56 mL/min — AB (ref >60)
Glucose, Bld: 123 mg/dL — ABNORMAL HIGH (ref 70–99)
Potassium: 3.7 mmol/L (ref 3.5–5.1)
Sodium: 143 mmol/L (ref 135–145)

## 2018-07-01 NOTE — Progress Notes (Signed)
Tina Hayes - PHARMACIST COUNSELING NOTE  ADHERENCE ASSESSMENT  Adherence strategy: keeps medications in original bottles and keeps them all together in one place  Patient previously used bubble packs but says she was getting very confused. She couldn't remember what each medication was supposed to look like or which medication was which. She now keeps them in the original bottles so that if she forgets what the medication looks like or is for, she can look at the bottle and directions to remind herself. She reports increased compliance with this. Prior, she had run out of torsemide for about a week because she had gotten confused.   Do you ever forget to take your medication? [] Yes (1) [x] No (0)  Do you ever skip doses due to side effects? [] Yes (1) [x] No (0)  Do you have trouble affording your medicines? [] Yes (1) [x] No (0)  Are you ever unable to pick up your medication due to transportation difficulties? [] Yes (1) [x] No (0)  Do you ever stop taking your medications because you don't believe they are helping? [] Yes (1) [x] No (0)  Total score _0______    Recommendations given to patient about increasing adherence: None. She reports better compliance with her regimen as explained above.  Guideline-Directed Medical Therapy/Evidence Based Medicine    ACE/ARB/ARNI: losartan 100 mg daily   Beta Blocker: carvedilol 3.125 mg twice daily   Aldosterone Antagonist: none Diuretic: torsemide 60 mg twice daily    SUBJECTIVE   HPI: Here for follow up visit. Concerned about a new inhaler that a pulmonologist called in for her. She was also concerned about medications making her dehydrated and decreasing her potassium.  Past Medical History:  Diagnosis Date  . (HFpEF) heart failure with preserved ejection fraction ([place])    a. 05/2016 Echo: EF 60-65%, mild to mod LVH, Gr1 DD, mild MR, mildly dil LA, mod TR, mildly to mod increased PASP.  [name] Acute  diastolic heart failure ([place]) 01/27/2017  . Anxiety   . Chest pain 06/16/2016  . CHF (congestive heart failure) ([place])   . Chronic back pain   . Chronic diastolic congestive heart failure ([place]) 02/13/2017  . COPD (chronic obstructive pulmonary disease) ([place])   . Coronary artery disease    a. s/p remote PCI x 5;  b. 2006 s/p CABG x 3 ([place], [place]); b. 05/2016 MV: attenuation corrected images w/o ischemia or wma-->Med rx.  . Coronary artery disease of native artery of native heart with stable angina pectoris ([place]) 06/17/2016  . Depression   . Diabetes mellitus without complication ([place])   . Essential hypertension 06/30/2016  . Heart attack ([place])    Total of 3 per pt.  . Hypertension   . Hypertensive urgency 06/03/2015  . Seizure ([place])   . Seizures ([place])         OBJECTIVE    Vital signs: HR 60, BP 108/60, weight (pounds) 302.4  ECHO: Date 06/17/16, EF 60-65%    BMP Latest Ref Rng & Units 05/28/2018 05/27/2018 05/26/2018  Glucose 70 - 99 mg/dL 183(H) 181(H) 130(H)  BUN 8 - 23 mg/dL 27(H) 42(H) 51(H)  Creatinine 0.44 - 1.00 mg/dL 0.94 1.12(H) 1.80(H)  Sodium 135 - 145 mmol/L 138 138 135  Potassium 3.5 - 5.1 mmol/L 3.0(L) 3.2(L) 3.0(L)  Chloride 98 - 111 mmol/L 103 100 95(L)  CO2 22 - 32 mmol/L 27 28 29   Calcium 8.9 - 10.3 mg/dL 8.6(L) 8.6(L) 8.2(L)    ASSESSMENT 73 year  old female with HFpEF. Around last visit, she had run out of torsemide for about a week and had gained significant weight. She took metolazone for 3 days. She reports doing better since then. Denies swelling, shortness of breath or abnormal weight gain. She is concerned about a new inhaler a pulmonologist called in for her to replace her albuterol. The pharmacist at [facility] told her that it was too high of a dose and they are trying to contact the doctor but haven't heard back. She has called the doctor as well. She is also concerned about her potassium and being dehydrated. Last time she went to  the hospital, her potassium was very low and she was dehydrated and she doesn't want that to happen again.   PLAN We discussed signs and symptoms of dehydration or low potassium, encouraged patient to call PCP or HF clinic if she ever feels like these could be off. Patient to have BMP checked today. Counseled to continue weighing daily and report any abnormal weight gain as she has been doing.  We discussed the importance of continuing to take her medications and to make sure she does not run out. Encouraged patient to continue contacting the pulmonologist's office and [facility] to try and figure out what's going on with new inhaler. In the interim, she is using her albuterol nebulizer (out of the inhaler, says she has no refills) as needed and will contact her PCP if she is running low and the issue with the new inhaler has not been resolved.    Time spent: 10 minutes  [place], Pharm.D. Clinical Pharmacist 07/01/2018 9:48 AM    Current Outpatient Medications:  .  albuterol (PROVENTIL HFA;VENTOLIN HFA) 108 (90 Base) MCG/ACT inhaler, Inhale 2 puffs into the lungs every 6 (six) hours as needed for wheezing or shortness of breath., Disp: 1 Inhaler, Rfl: 2 .  albuterol (PROVENTIL) (2.5 MG/3ML) 0.083% nebulizer solution, Take 2.5 mg every 4 (four) hours as needed by nebulization for wheezing or shortness of breath., Disp: , Rfl:  .  amLODipine (NORVASC) 10 MG tablet, Take 10 mg by mouth daily., Disp: , Rfl:  .  atorvastatin (LIPITOR) 40 MG tablet, Take 40 mg at bedtime by mouth. , Disp: , Rfl:  .  Calcium Carbonate-Vitamin D3 (CALCIUM 600-D) 600-400 MG-UNIT TABS, Take 1 tablet by mouth daily. , Disp: , Rfl:  .  carvedilol (COREG) 3.125 MG tablet, Take 1 tablet (3.125 mg total) by mouth 2 (two) times daily with a meal., Disp: 180 tablet, Rfl: 3 .  cetirizine (ZYRTEC) 10 MG tablet, Take 10 mg by mouth daily., Disp: , Rfl:  .  citalopram (CELEXA) 20 MG tablet, Take 20 mg by mouth at bedtime. ,  Disp: , Rfl:  .  clopidogrel (PLAVIX) 75 MG tablet, Take 1 tablet (75 mg total) by mouth daily., Disp: 30 tablet, Rfl: 0 .  famotidine (PEPCID) 20 MG tablet, Take 40 mg by mouth Nightly., Disp: , Rfl:  .  HYDROcodone-acetaminophen (NORCO/VICODIN) 5-325 MG tablet, Take 1 tablet by mouth every 6 (six) hours as needed for moderate pain or severe pain., Disp: 90 tablet, Rfl: 0 .  insulin aspart (NOVOLOG) 100 UNIT/ML injection, Inject 10 Units into the skin 3 (three) times daily. , Disp: , Rfl:  .  insulin glargine (LANTUS) 100 UNIT/ML injection, Inject 30-40 Units into the skin See admin instructions. Inject 30 units in the morning at 40 units at bedtime., Disp: , Rfl:  .  isosorbide mononitrate (IMDUR) 30 MG  24 hr tablet, Take 1 tablet (30 mg total) by mouth 2 (two) times daily., Disp: 180 tablet, Rfl: 3 .  levETIRAcetam (KEPPRA) 1000 MG tablet, Take 1 tablet (1,000 mg total) by mouth 2 (two) times daily., Disp: 60 tablet, Rfl: 0 .  losartan (COZAAR) 100 MG tablet, Take 100 mg by mouth daily., Disp: , Rfl:  .  montelukast (SINGULAIR) 10 MG tablet, Take 10 mg by mouth daily. , Disp: , Rfl:  .  nitroGLYCERIN (NITROSTAT) 0.4 MG SL tablet, DISSOLVE ONE TABLET UNDER THE TONGUE EVERY 5 MINUTES AS NEEDED FOR CHEST PAIN.  DO NOT EXCEED A TOTAL OF 3 DOSES IN 15 MINUTES (Patient taking differently: Place 0.4 mg under the tongue every 5 (five) minutes as needed for chest pain. ), Disp: 25 tablet, Rfl: 0 .  potassium chloride SA (K-DUR,KLOR-CON) 20 MEQ tablet, Take 2 tablets (40 mEq total) by mouth 3 (three) times daily. Take extra 2 tablets (40 meq) if you take metolazone (Patient taking differently: Take 40 mEq by mouth 2 (two) times daily. Take extra 2 tablets (40 meq) if you take metolazone), Disp: 120 tablet, Rfl: 3 .  ranitidine (ZANTAC) 300 MG tablet, Take 300 mg by mouth at bedtime., Disp: , Rfl:  .  tiotropium (SPIRIVA) 18 MCG inhalation capsule, Place 18 mcg into inhaler and inhale daily., Disp: , Rfl:  .   torsemide (DEMADEX) 20 MG tablet, Take 2 tablets (40 mg total) by mouth 2 (two) times daily. (Patient taking differently: Take 60 mg by mouth 2 (two) times daily. ), Disp: 180 tablet, Rfl: 0 .  traZODone (DESYREL) 50 MG tablet, Take 50 mg by mouth at bedtime., Disp: , Rfl:    COUNSELING POINTS/CLINICAL PEARLS  Carvedilol (Goal: weight less than 85 kg is 25 mg BID, weight greater than 85 kg is 50 mg BID)  Patient should avoid activities requiring coordination until drug effects are realized, as drug may cause dizziness.  This drug may cause diarrhea, nausea, vomiting, arthralgia, back pain, myalgia, headache, vision disorder, erectile dysfunction, reduced libido, or fatigue.  Instruct patient to report signs/symptoms of adverse cardiovascular effects such as hypotension (especially in elderly patients), arrhythmias, syncope, palpitations, angina, or edema.  Drug may mask symptoms of hypoglycemia. Advise diabetic patients to carefully monitor blood sugar levels.  Patient should take drug with food.  Advise patient against sudden discontinuation of drug. Losartan (Goal: 150 mg once daily)  Warn female patient to avoid pregnancy and to report a pregnancy that occurs during therapy.  Side effects may include dizziness, upper respiratory infection, nasal congestion, and back pain.  Warn patient to avoid use of potassium supplements or potassium-containing salt substitutes unless they consult healthcare provider. Torsemide  Side effects may include excessive urination.  Tell patient to report symptoms of ototoxicity.  Instruct patient to report lightheadedness or syncope.  Warn patient to avoid use of nonprescription NSAID products without first discussing it with their healthcare provider.   DRUGS TO AVOID IN HEART FAILURE  Drug or Class Mechanism  Analgesics . NSAIDs . COX-2 inhibitors . Glucocorticoids  Sodium and water retention, increased systemic vascular resistance, decreased response to  diuretics   Diabetes Medications . Metformin . Thiazolidinediones o Rosiglitazone (Avandia) o Pioglitazone (Actos) . DPP4 Inhibitors o Saxagliptin (Onglyza) o Sitagliptin (Januvia)   Lactic acidosis Possible calcium channel blockade   Unknown  Antiarrhythmics . Class I  o Flecainide o Disopyramide . Class III o Sotalol . Other o Dronedarone  Negative inotrope, proarrhythmic   Proarrhythmic, beta  blockade  Negative inotrope  Antihypertensives . Alpha Blockers o Doxazosin . Calcium Channel Blockers o Diltiazem o Verapamil o Nifedipine . Central Alpha Adrenergics o Moxonidine . Peripheral Vasodilators o Minoxidil  Increases renin and aldosterone  Negative inotrope    Possible sympathetic withdrawal  Unknown  Anti-infective . Itraconazole . Amphotericin B  Negative inotrope Unknown  Hematologic . Anagrelide . Cilostazol   Possible inhibition of PD IV Inhibition of PD III causing arrhythmias  Neurologic/Psychiatric . Stimulants . Anti-Seizure Drugs o Carbamazepine o Pregabalin . Antidepressants o Tricyclics o Citalopram . Parkinsons o Bromocriptine o Pergolide o Pramipexole . Antipsychotics o Clozapine . Antimigraine o Ergotamine o Methysergide . Appetite suppressants . Bipolar o Lithium  Peripheral alpha and beta agonist activity  Negative inotrope and chronotrope Calcium channel blockade  Negative inotrope, proarrhythmic Dose-dependent QT prolongation  Excessive serotonin activity/valvular damage Excessive serotonin activity/valvular damage Unknown  IgE mediated hypersensitivy, calcium channel blockade  Excessive serotonin activity/valvular damage Excessive serotonin activity/valvular damage Valvular damage  Direct myofibrillar degeneration, adrenergic stimulation  Antimalarials . Chloroquine . Hydroxychloroquine Intracellular inhibition of lysosomal enzymes  Urologic Agents . Alpha  Blockers o Doxazosin o Prazosin o Tamsulosin o Terazosin  Increased renin and aldosterone  Adapted from Page RL, et al. "Drugs That May Cause or Exacerbate Heart Failure: A Scientific Statement from the [employer]." Circulation 2016; 007:M22-Q33. DOI: 10.1161/CIR.0000000000000426   MEDICATION ADHERENCES TIPS AND STRATEGIES 1. Taking medication as prescribed improves patient outcomes in heart failure (reduces hospitalizations, improves symptoms, increases survival) 2. Side effects of medications can be managed by decreasing doses, switching agents, stopping drugs, or adding additional therapy. Please let someone in the [facility] know if you have having bothersome side effects so we can modify your regimen. Do not alter your medication regimen without talking to [place].  3. Medication reminders can help patients remember to take drugs on time. If you are missing or forgetting doses you can try linking behaviors, using pill boxes, or an electronic reminder like an alarm on your phone or an app. Some people can also get automated phone calls as medication reminders.

## 2018-07-01 NOTE — Patient Instructions (Signed)
Continue weighing daily and call for an overnight weight gain of > 2 pounds or a weekly weight gain of >5 pounds. 

## 2018-07-14 ENCOUNTER — Ambulatory Visit: Payer: Medicare HMO | Attending: Anesthesiology | Admitting: Anesthesiology

## 2018-07-14 ENCOUNTER — Other Ambulatory Visit: Payer: Self-pay

## 2018-07-14 ENCOUNTER — Encounter: Payer: Self-pay | Admitting: Anesthesiology

## 2018-07-14 VITALS — BP 162/80 | HR 64 | Temp 98.6°F | Resp 18 | Ht 71.0 in | Wt 297.0 lb

## 2018-07-14 DIAGNOSIS — F1190 Opioid use, unspecified, uncomplicated: Secondary | ICD-10-CM

## 2018-07-14 DIAGNOSIS — M5431 Sciatica, right side: Secondary | ICD-10-CM

## 2018-07-14 DIAGNOSIS — M5136 Other intervertebral disc degeneration, lumbar region: Secondary | ICD-10-CM

## 2018-07-14 DIAGNOSIS — M48062 Spinal stenosis, lumbar region with neurogenic claudication: Secondary | ICD-10-CM | POA: Diagnosis not present

## 2018-07-14 DIAGNOSIS — M47817 Spondylosis without myelopathy or radiculopathy, lumbosacral region: Secondary | ICD-10-CM

## 2018-07-14 DIAGNOSIS — M4716 Other spondylosis with myelopathy, lumbar region: Secondary | ICD-10-CM

## 2018-07-14 DIAGNOSIS — G894 Chronic pain syndrome: Secondary | ICD-10-CM

## 2018-07-14 DIAGNOSIS — M170 Bilateral primary osteoarthritis of knee: Secondary | ICD-10-CM

## 2018-07-14 DIAGNOSIS — M51369 Other intervertebral disc degeneration, lumbar region without mention of lumbar back pain or lower extremity pain: Secondary | ICD-10-CM

## 2018-07-14 DIAGNOSIS — M5432 Sciatica, left side: Secondary | ICD-10-CM

## 2018-07-14 MED ORDER — HYDROCODONE-ACETAMINOPHEN 5-325 MG PO TABS: 1.0000 | ORAL_TABLET | Freq: Four times a day (QID) | ORAL | 0 refills | Status: AC | PRN

## 2018-07-14 MED ORDER — HYDROCODONE-ACETAMINOPHEN 5-325 MG PO TABS: 1.0000 | ORAL_TABLET | Freq: Four times a day (QID) | ORAL | 0 refills | Status: DC | PRN

## 2018-07-14 NOTE — Progress Notes (Signed)
Subjective:  Patient ID: Tina Hayes, female    DOB: 07-04-1945  Age: 73 y.o. MRN: [id number]  CC: Medication Refill and Back Pain   Procedure: None  HPI Tina Hayes presents for reevaluation.  She was last seen back in December and had an epidural at that time for her low back pain and bilateral lower leg sciatica.  She reported to 75% improvement this lasted up until mid to late February.  Over the course of the last few weeks she has had recurrence of the same quality pain.  She is taking her medications as prescribed and using these on a as needed basis as well and generally reports good relief with the medications and no side effects.  I have reviewed her narcotic assessment sheet and she derives good functional lifestyle improvement with the medicines and no untoward side effects are noted.  The quality the pain is been stable in nature with no bowel or bladder changes.  We do not have an MRI of her lumbar spine on record.  Outpatient Medications Prior to Visit  Medication Sig Dispense Refill  . albuterol (PROVENTIL HFA;VENTOLIN HFA) 108 (90 Base) MCG/ACT inhaler Inhale 2 puffs into the lungs every 6 (six) hours as needed for wheezing or shortness of breath. 1 Inhaler 2  . albuterol (PROVENTIL) (2.5 MG/3ML) 0.083% nebulizer solution Take 2.5 mg every 4 (four) hours as needed by nebulization for wheezing or shortness of breath.    [name] amLODipine (NORVASC) 10 MG tablet Take 10 mg by mouth daily.    [name] atorvastatin (LIPITOR) 40 MG tablet Take 40 mg at bedtime by mouth.     . Calcium Carbonate-Vitamin D3 (CALCIUM 600-D) 600-400 MG-UNIT TABS Take 1 tablet by mouth daily.     . carvedilol (COREG) 3.125 MG tablet Take 1 tablet (3.125 mg total) by mouth 2 (two) times daily with a meal. 180 tablet 3  . cetirizine (ZYRTEC) 10 MG tablet Take 10 mg by mouth daily.    . citalopram (CELEXA) 20 MG tablet Take 20 mg by mouth at bedtime.     . clopidogrel (PLAVIX) 75 MG tablet Take 1 tablet (75 mg  total) by mouth daily. 30 tablet 0  . famotidine (PEPCID) 20 MG tablet Take 40 mg by mouth Nightly.    . insulin aspart (NOVOLOG) 100 UNIT/ML injection Inject 10 Units into the skin 3 (three) times daily.     . insulin glargine (LANTUS) 100 UNIT/ML injection Inject 30-40 Units into the skin See admin instructions. Inject 30 units in the morning at 40 units at bedtime.    . isosorbide mononitrate (IMDUR) 30 MG 24 hr tablet Take 1 tablet (30 mg total) by mouth 2 (two) times daily. 180 tablet 3  . levETIRAcetam (KEPPRA) 1000 MG tablet Take 1 tablet (1,000 mg total) by mouth 2 (two) times daily. 60 tablet 0  . losartan (COZAAR) 100 MG tablet Take 100 mg by mouth daily.    . montelukast (SINGULAIR) 10 MG tablet Take 10 mg by mouth daily.     . nitroGLYCERIN (NITROSTAT) 0.4 MG SL tablet DISSOLVE ONE TABLET UNDER THE TONGUE EVERY 5 MINUTES AS NEEDED FOR CHEST PAIN.  DO NOT EXCEED A TOTAL OF 3 DOSES IN 15 MINUTES 25 tablet 0  . potassium chloride SA (K-DUR,KLOR-CON) 20 MEQ tablet Take 2 tablets (40 mEq total) by mouth 3 (three) times daily. Take extra 2 tablets (40 meq) if you take metolazone (Patient taking differently: Take 40 mEq by mouth 2 (  two) times daily. Take extra 2 tablets (40 meq) if you take metolazone) 120 tablet 3  . ranitidine (ZANTAC) 300 MG tablet Take 300 mg by mouth at bedtime.    [name] tiotropium (SPIRIVA) 18 MCG inhalation capsule Place 18 mcg into inhaler and inhale daily.    [name] torsemide (DEMADEX) 20 MG tablet Take 2 tablets (40 mg total) by mouth 2 (two) times daily. (Patient taking differently: Take 60 mg by mouth 2 (two) times daily. ) 180 tablet 0  . traZODone (DESYREL) 50 MG tablet Take 50 mg by mouth at bedtime.    [name] HYDROcodone-acetaminophen (NORCO/VICODIN) 5-325 MG tablet Take 1 tablet by mouth every 6 (six) hours as needed for moderate pain or severe pain. 90 tablet 0   No facility-administered medications prior to visit.     Review of Systems CNS: No confusion or  sedation Cardiac: No angina or palpitations GI: No abdominal pain or constipation Constitutional: No nausea vomiting fevers or chills  Objective:  BP (!) 162/80   Pulse 64   Temp 98.6 F (37 C)   Resp 18   Ht 5\' 11"  (1.803 m)   Wt 297 lb (134.7 kg)   SpO2 100%   BMI 41.42 kg/m    BP Readings from Last 3 Encounters:  07/14/18 (!) 162/80  07/01/18 108/60  06/11/18 92/60     Wt Readings from Last 3 Encounters:  07/14/18 297 lb (134.7 kg)  07/01/18 (!) 302 lb 4 oz (137.1 kg)  06/11/18 (!) 302 lb 6 oz (137.2 kg)     Physical Exam Pt is alert and oriented PERRL EOMI HEART IS RRR no murmur or rub LCTA no wheezing or rales MUSCULOSKELETAL reveals ambulatory assistance with a wheelchair and muscle tone and bulk to the lower extremities is at baseline.  Labs  Lab Results  Component Value Date   HGBA1C 7.5 (H) 05/27/2018   HGBA1C 7.7 (H) 08/16/2017   HGBA1C 8.5 (H) 01/29/2017   Lab Results  Component Value Date   LDLCALC 58 01/30/2017   CREATININE 1.02 (H) 07/01/2018    -------------------------------------------------------------------------------------------------------------------- Lab Results  Component Value Date   WBC 7.2 05/28/2018   HGB 9.9 (L) 05/28/2018   HCT 31.0 (L) 05/28/2018   PLT 238 05/28/2018   GLUCOSE 123 (H) 07/01/2018   CHOL 120 01/30/2017   TRIG 73 01/30/2017   HDL 47 01/30/2017   LDLCALC 58 01/30/2017   ALT 28 05/26/2018   AST 27 05/26/2018   NA 143 07/01/2018   K 3.7 07/01/2018   CL 106 07/01/2018   CREATININE 1.02 (H) 07/01/2018   BUN 15 07/01/2018   CO2 27 07/01/2018   INR 0.98 05/25/2018   HGBA1C 7.5 (H) 05/27/2018    --------------------------------------------------------------------------------------------------------------------- Ct Head Wo Contrast  Result Date: 05/25/2018 CLINICAL DATA:  Numbness to RIGHT side of body, blurred vision RIGHT eye, dizziness, headache, history essential hypertension, diabetes mellitus,  COPD, CHF, coronary artery disease, seizure disorder EXAM: CT HEAD WITHOUT CONTRAST TECHNIQUE: Contiguous axial images were obtained from the base of the skull through the vertex without intravenous contrast. Sagittal and coronal MPR images reconstructed from axial data set. COMPARISON:  11/10/2017 FINDINGS: Brain: Generalized atrophy. Normal ventricular morphology. No midline shift or mass effect. Small vessel chronic ischemic changes of deep cerebral white matter. No intracranial hemorrhage, mass lesion, or evidence of acute infarction. Tiny BILATERAL basal ganglia calcifications unchanged. No extra-axial fluid collections. Vascular: Atherosclerotic calcification of internal carotid and vertebral arteries at skull base Skull: Intact Sinuses/Orbits: Clear Other:  N/A IMPRESSION: Atrophy with small vessel chronic ischemic changes of deep cerebral white matter. No acute intracranial abnormalities. Electronically Signed   By: Lavonia Dana M.D.   On: 05/25/2018 17:23   Dg Chest Portable 1 View  Result Date: 05/25/2018 CLINICAL DATA:  Numbness RIGHT side of body, dizziness, headache, blurred vision RIGHT eye, chronic diastolic CHF, coronary artery disease, diabetes mellitus, essential hypertension, COPD, seizure disorder, former smoker EXAM: PORTABLE CHEST 1 VIEW COMPARISON:  Portable exam 1642 hours compared to 08/15/2017 FINDINGS: Upper normal heart size post CABG. Density at the inferior mediastinum reflects hiatal hernia seen on the previous exam. Mediastinal contours and pulmonary vascularity otherwise normal. Minimal subsegmental atelectasis LEFT base. No pulmonary infiltrate, pleural effusion or pneumothorax. Bones demineralized. IMPRESSION: Hiatal hernia. Subsegmental atelectasis LEFT base. Electronically Signed   By: Lavonia Dana M.D.   On: 05/25/2018 17:15     Assessment & Plan:   Sahalie was seen today for medication refill and back pain.  Diagnoses and all orders for this visit:  Bilateral  sciatica -     Lumbar Epidural Injection; Future -     MR LUMBAR SPINE WO CONTRAST; Future  DDD (degenerative disc disease), lumbar -     Lumbar Epidural Injection; Future -     MR LUMBAR SPINE WO CONTRAST; Future  Spinal stenosis of lumbar region with neurogenic claudication -     Lumbar Epidural Injection; Future -     MR LUMBAR SPINE WO CONTRAST; Future  Lumbar spondylosis with myelopathy  Facet arthritis of lumbosacral region  Primary osteoarthritis of both knees  Chronic pain syndrome  Chronic, continuous use of opioids  Other orders -     HYDROcodone-acetaminophen (NORCO/VICODIN) 5-325 MG tablet; Take 1 tablet by mouth every 6 (six) hours as needed for up to 30 days for moderate pain or severe pain. -     HYDROcodone-acetaminophen (NORCO/VICODIN) 5-325 MG tablet; Take 1 tablet by mouth every 6 (six) hours as needed for up to 30 days for moderate pain or severe pain.        ----------------------------------------------------------------------------------------------------------------------  Problem List Items Addressed This Visit    None    Visit Diagnoses    Bilateral sciatica    -  Primary   Relevant Orders   Lumbar Epidural Injection   MR LUMBAR SPINE WO CONTRAST   DDD (degenerative disc disease), lumbar       Relevant Medications   HYDROcodone-acetaminophen (NORCO/VICODIN) 5-325 MG tablet   HYDROcodone-acetaminophen (NORCO/VICODIN) 5-325 MG tablet (Start on 08/13/2018)   Other Relevant Orders   Lumbar Epidural Injection   MR LUMBAR SPINE WO CONTRAST   Spinal stenosis of lumbar region with neurogenic claudication       Relevant Medications   HYDROcodone-acetaminophen (NORCO/VICODIN) 5-325 MG tablet   HYDROcodone-acetaminophen (NORCO/VICODIN) 5-325 MG tablet (Start on 08/13/2018)   Other Relevant Orders   Lumbar Epidural Injection   MR LUMBAR SPINE WO CONTRAST   Lumbar spondylosis with myelopathy       Facet arthritis of lumbosacral region       Relevant  Medications   HYDROcodone-acetaminophen (NORCO/VICODIN) 5-325 MG tablet   HYDROcodone-acetaminophen (NORCO/VICODIN) 5-325 MG tablet (Start on 08/13/2018)   Primary osteoarthritis of both knees       Chronic pain syndrome       Chronic, continuous use of opioids            ----------------------------------------------------------------------------------------------------------------------  1. Bilateral sciatica Secondary to the chronicity of the pain and her symptom complex and going to  schedule her for a lumbar MRI to further evaluate for underlying lumbar pathology.  Furthermore we will have her return to clinic in 1 month scheduled for an epidural steroid for her low back pain that seems to be crescendoing at this time. - Lumbar Epidural Injection; Future - MR LUMBAR SPINE WO CONTRAST; Future  2. DDD (degenerative disc disease), lumbar As above - Lumbar Epidural Injection; Future - MR LUMBAR SPINE WO CONTRAST; Future  3. Spinal stenosis of lumbar region with neurogenic claudication  - Lumbar Epidural Injection; Future - MR LUMBAR SPINE WO CONTRAST; Future  4. Lumbar spondylosis with myelopathy As above  5. Facet arthritis of lumbosacral region   6. Primary osteoarthritis of both knees   7. Chronic pain syndrome Furthermore I will refill her medications for dating March 18 and April 17 for her Vicodin.  I have reviewed the [facility] practitioner database information and it is appropriate.  8. Chronic, continuous use of opioids     ----------------------------------------------------------------------------------------------------------------------  I have changed Velora Heckler. Julson's HYDROcodone-acetaminophen. I am also having her start on HYDROcodone-acetaminophen. Additionally, I am having her maintain her tiotropium, traZODone, albuterol, citalopram, insulin aspart, carvedilol, isosorbide mononitrate, clopidogrel, atorvastatin, insulin glargine, Calcium  Carbonate-Vitamin D3, albuterol, montelukast, levETIRAcetam, amLODipine, potassium chloride SA, torsemide, losartan, ranitidine, cetirizine, famotidine, and nitroGLYCERIN.   Meds ordered this encounter  Medications  . HYDROcodone-acetaminophen (NORCO/VICODIN) 5-325 MG tablet    Sig: Take 1 tablet by mouth every 6 (six) hours as needed for up to 30 days for moderate pain or severe pain.    Dispense:  90 tablet    Refill:  0    30 day supply  . HYDROcodone-acetaminophen (NORCO/VICODIN) 5-325 MG tablet    Sig: Take 1 tablet by mouth every 6 (six) hours as needed for up to 30 days for moderate pain or severe pain.    Dispense:  90 tablet    Refill:  0    30 day supply   Patient's Medications  New Prescriptions   HYDROCODONE-ACETAMINOPHEN (NORCO/VICODIN) 5-325 MG TABLET    Take 1 tablet by mouth every 6 (six) hours as needed for up to 30 days for moderate pain or severe pain.  Previous Medications   ALBUTEROL (PROVENTIL HFA;VENTOLIN HFA) 108 (90 BASE) MCG/ACT INHALER    Inhale 2 puffs into the lungs every 6 (six) hours as needed for wheezing or shortness of breath.   ALBUTEROL (PROVENTIL) (2.5 MG/3ML) 0.083% NEBULIZER SOLUTION    Take 2.5 mg every 4 (four) hours as needed by nebulization for wheezing or shortness of breath.   AMLODIPINE (NORVASC) 10 MG TABLET    Take 10 mg by mouth daily.   ATORVASTATIN (LIPITOR) 40 MG TABLET    Take 40 mg at bedtime by mouth.    CALCIUM CARBONATE-VITAMIN D3 (CALCIUM 600-D) 600-400 MG-UNIT TABS    Take 1 tablet by mouth daily.    CARVEDILOL (COREG) 3.125 MG TABLET    Take 1 tablet (3.125 mg total) by mouth 2 (two) times daily with a meal.   CETIRIZINE (ZYRTEC) 10 MG TABLET    Take 10 mg by mouth daily.   CITALOPRAM (CELEXA) 20 MG TABLET    Take 20 mg by mouth at bedtime.    CLOPIDOGREL (PLAVIX) 75 MG TABLET    Take 1 tablet (75 mg total) by mouth daily.   FAMOTIDINE (PEPCID) 20 MG TABLET    Take 40 mg by mouth Nightly.   INSULIN ASPART (NOVOLOG) 100 UNIT/ML  INJECTION    Inject  10 Units into the skin 3 (three) times daily.    INSULIN GLARGINE (LANTUS) 100 UNIT/ML INJECTION    Inject 30-40 Units into the skin See admin instructions. Inject 30 units in the morning at 40 units at bedtime.   ISOSORBIDE MONONITRATE (IMDUR) 30 MG 24 HR TABLET    Take 1 tablet (30 mg total) by mouth 2 (two) times daily.   LEVETIRACETAM (KEPPRA) 1000 MG TABLET    Take 1 tablet (1,000 mg total) by mouth 2 (two) times daily.   LOSARTAN (COZAAR) 100 MG TABLET    Take 100 mg by mouth daily.   MONTELUKAST (SINGULAIR) 10 MG TABLET    Take 10 mg by mouth daily.    NITROGLYCERIN (NITROSTAT) 0.4 MG SL TABLET    DISSOLVE ONE TABLET UNDER THE TONGUE EVERY 5 MINUTES AS NEEDED FOR CHEST PAIN.  DO NOT EXCEED A TOTAL OF 3 DOSES IN 15 MINUTES   POTASSIUM CHLORIDE SA (K-DUR,KLOR-CON) 20 MEQ TABLET    Take 2 tablets (40 mEq total) by mouth 3 (three) times daily. Take extra 2 tablets (40 meq) if you take metolazone   RANITIDINE (ZANTAC) 300 MG TABLET    Take 300 mg by mouth at bedtime.   TIOTROPIUM (SPIRIVA) 18 MCG INHALATION CAPSULE    Place 18 mcg into inhaler and inhale daily.   TORSEMIDE (DEMADEX) 20 MG TABLET    Take 2 tablets (40 mg total) by mouth 2 (two) times daily.   TRAZODONE (DESYREL) 50 MG TABLET    Take 50 mg by mouth at bedtime.  Modified Medications   Modified Medication Previous Medication   HYDROCODONE-ACETAMINOPHEN (NORCO/VICODIN) 5-325 MG TABLET HYDROcodone-acetaminophen (NORCO/VICODIN) 5-325 MG tablet      Take 1 tablet by mouth every 6 (six) hours as needed for up to 30 days for moderate pain or severe pain.    Take 1 tablet by mouth every 6 (six) hours as needed for moderate pain or severe pain.  Discontinued Medications   No medications on file   ----------------------------------------------------------------------------------------------------------------------  Follow-up: Return in about 1 month (around 08/14/2018) for procedure, evaluation.    Molli Barrows,  MD

## 2018-07-14 NOTE — Progress Notes (Signed)
Nursing Pain Medication Assessment:  Safety precautions to be maintained throughout the outpatient stay will include: orient to surroundings, keep bed in low position, maintain call bell within reach at all times, provide assistance with transfer out of bed and ambulation.  Medication Inspection Compliance: Ms. Humm did not comply with our request to bring her pills to be counted. She was reminded that bringing the medication bottles, even when empty, is a requirement.  Medication: None brought in. Pill/Patch Count: None available to be counted. Bottle Appearance: No container available. Did not bring bottle(s) to appointment. Filled Date: N/A Last Medication intake:  07/03/2018

## 2018-07-14 NOTE — Patient Instructions (Signed)
Stop taking Plavix 7 days before procedure.   ____________________________________________________________________________________________  Preparing for Procedure with Sedation  Procedure appointments are limited to planned procedures: . No Prescription Refills. . No disability issues will be discussed. . No medication changes will be discussed.  Instructions: . Oral Intake: Do not eat or drink anything for at least 8 hours prior to your procedure. . Transportation: Public transportation is not allowed. Bring an adult driver. The driver must be physically present in our waiting room before any procedure can be started. [name] Physical Assistance: Bring an adult physically capable of assisting you, in the event you need help. This adult should keep you company at home for at least 6 hours after the procedure. . Blood Pressure Medicine: Take your blood pressure medicine with a sip of water the morning of the procedure. . Blood thinners: Notify our staff if you are taking any blood thinners. Depending on which one you take, there will be specific instructions on how and when to stop it. . Diabetics on insulin: Notify the staff so that you can be scheduled 1st case in the morning. If your diabetes requires high dose insulin, take only  of your normal insulin dose the morning of the procedure and notify the staff that you have done so. . Preventing infections: Shower with an antibacterial soap the morning of your procedure. . Build-up your immune system: Take 1000 mg of Vitamin C with every meal (3 times a day) the day prior to your procedure. [name] Antibiotics: Inform the staff if you have a condition or reason that requires you to take antibiotics before dental procedures. . Pregnancy: If you are pregnant, call and cancel the procedure. . Sickness: If you have a cold, fever, or any active infections, call and cancel the procedure. . Arrival: You must be in the facility at least 30 minutes prior to your  scheduled procedure. . Children: Do not bring children with you. . Dress appropriately: Bring dark clothing that you would not mind if they get stained. . Valuables: Do not bring any jewelry or valuables.  Reasons to call and reschedule or cancel your procedure: (Following these recommendations will minimize the risk of a serious complication.) . Surgeries: Avoid having procedures within 2 weeks of any surgery. (Avoid for 2 weeks before or after any surgery). . Flu Shots: Avoid having procedures within 2 weeks of a flu shots or . (Avoid for 2 weeks before or after immunizations). . Barium: Avoid having a procedure within 7-10 days after having had a radiological study involving the use of radiological contrast. (Myelograms, Barium swallow or enema study). . Heart attacks: Avoid any elective procedures or surgeries for the initial 6 months after a "Myocardial Infarction" (Heart Attack). . Blood thinners: It is imperative that you stop these medications before procedures. Let us know if you if you take any blood thinner.  . Infection: Avoid procedures during or within two weeks of an infection (including chest colds or gastrointestinal problems). Symptoms associated with infections include: Localized redness, fever, chills, night sweats or profuse sweating, burning sensation when voiding, cough, congestion, stuffiness, runny nose, sore throat, diarrhea, nausea, vomiting, cold or Flu symptoms, recent or current infections. It is specially important if the infection is over the area that we intend to treat. [name] Heart and lung problems: Symptoms that may suggest an active cardiopulmonary problem include: cough, chest pain, breathing difficulties or shortness of breath, dizziness, ankle swelling, uncontrolled high or unusually low blood pressure, and/or palpitations. If you are experiencing  any of these symptoms, cancel your procedure and contact your primary care physician for an evaluation.  Remember:   Regular Business hours are:  Monday to Thursday 8:00 AM to 4:00 PM  Provider's Schedule: Milinda Pointer, MD:  Procedure days: Tuesday and Thursday 7:30 AM to 4:00 PM  Gillis Santa, MD:  Procedure days: Monday and Wednesday 7:30 AM to 4:00 PM ____________________________________________________________________________________________  Epidural Steroid Injection Patient Information  Description: The epidural space surrounds the nerves as they exit the spinal cord.  In some patients, the nerves can be compressed and inflamed by a bulging disc or a tight spinal canal (spinal stenosis).  By injecting steroids into the epidural space, we can bring irritated nerves into direct contact with a potentially helpful medication.  These steroids act directly on the irritated nerves and can reduce swelling and inflammation which often leads to decreased pain.  Epidural steroids may be injected anywhere along the spine and from the neck to the low back depending upon the location of your pain.   After numbing the skin with local anesthetic (like Novocaine), a small needle is passed into the epidural space slowly.  You may experience a sensation of pressure while this is being done.  The entire block usually last less than 10 minutes.  Conditions which may be treated by epidural steroids:   Low back and leg pain  Neck and arm pain  Spinal stenosis  Post-laminectomy syndrome  Herpes zoster (shingles) pain  Pain from compression fractures  Preparation for the injection:  1. Do not eat any solid food or dairy products within 8 hours of your appointment.  2. You may drink clear liquids up to 3 hours before appointment.  Clear liquids include water, black coffee, juice or soda.  No milk or cream please. 3. You may take your regular medication, including pain medications, with a sip of water before your appointment  Diabetics should hold regular insulin (if taken separately) and take 1/2 normal NPH  dos the morning of the procedure.  Carry some sugar containing items with you to your appointment. 4. A driver must accompany you and be prepared to drive you home after your procedure.  5. Bring all your current medications with your. 6. An IV may be inserted and sedation may be given at the discretion of the physician.   7. A blood pressure cuff, EKG and other monitors will often be applied during the procedure.  Some patients may need to have extra oxygen administered for a short period. 8. You will be asked to provide medical information, including your allergies, prior to the procedure.  We must know immediately if you are taking blood thinners (like Coumadin/Warfarin)  Or if you are allergic to IV iodine contrast (dye). We must know if you could possible be pregnant.  Possible side-effects:  Bleeding from needle site  Infection (rare, may require surgery)  Nerve injury (rare)  Numbness & tingling (temporary)  Difficulty urinating (rare, temporary)  Spinal headache ( a headache worse with upright posture)  Light -headedness (temporary)  Pain at injection site (several days)  Decreased blood pressure (temporary)  Weakness in arm/leg (temporary)  Pressure sensation in back/neck (temporary)  Call if you experience:  Fever/chills associated with headache or increased back/neck pain.  Headache worsened by an upright position.  New onset weakness or numbness of an extremity below the injection site  Hives or difficulty breathing (go to the emergency room)  Inflammation or drainage at the infection site  Severe back/neck  pain  Any new symptoms which are concerning to you  Please note:  Although the local anesthetic injected can often make your back or neck feel good for several hours after the injection, the pain will likely return.  It takes 3-7 days for steroids to work in the epidural space.  You may not notice any pain relief for at least that one week.  If  effective, we will often do a series of three injections spaced 3-6 weeks apart to maximally decrease your pain.  After the initial series, we generally will wait several months before considering a repeat injection of the same type.  If you have any questions, please call [phone] [facility]

## 2018-07-27 ENCOUNTER — Other Ambulatory Visit: Payer: Self-pay | Admitting: Family

## 2018-07-27 MED ORDER — METOLAZONE 2.5 MG PO TABS: 2.5000 mg | ORAL_TABLET | Freq: Every day | ORAL | 0 refills | Status: DC

## 2018-07-27 NOTE — Progress Notes (Signed)
Patient reported weight gain and worsening shortness of breath. Has to schedule rides with transportation. Will add another course of metolazone 2.5mg  daily for the next 3 days with additional potassium.   Will try to coordinate labs with getting home health out there.

## 2018-07-28 ENCOUNTER — Telehealth: Payer: Self-pay

## 2018-07-28 NOTE — Telephone Encounter (Signed)
TELEPHONE CALL NOTE  Tina Hayes has been deemed a candidate for a follow-up tele-health visit to limit community exposure during the Covid-19 pandemic. I spoke with the patient via phone to ensure availability of phone/video source, confirm preferred email & phone number, discuss instructions and expectations, and review consent.   I reminded Tina Hayes to be prepared with any vital sign and/or heart rhythm information that could potentially be obtained via home monitoring, at the time of her visit.  Finally, I reminded Tina Hayes to expect an e-mail containing a link for their video-based visit approximately 15 minutes before her visit, or alternatively, a phone call at the time of her visit if her visit is planned to be a phone encounter.  Did the patient verbally consent to treatment as below? YES  [name], CMA 07/28/2018 12:34 PM  CONSENT FOR TELE-HEALTH VISIT - PLEASE REVIEW  I hereby voluntarily request, consent and authorize [facility] and its employed or contracted physicians, physician assistants, nurse practitioners or other licensed health care professionals (the Practitioner), to provide me with telemedicine health care services (the "Services") as deemed necessary by the treating Practitioner. I acknowledge and consent to receive the Services by the Practitioner via telemedicine. I understand that the telemedicine visit will involve communicating with the Practitioner through telephonic communication technology and the disclosure of certain medical information by electronic transmission. I acknowledge that I have been given the opportunity to request an in-person assessment or other available alternative prior to the telemedicine visit and am voluntarily participating in the telemedicine visit.  I understand that I have the right to withhold or withdraw my consent to the use of telemedicine in the course of my care at any time, without affecting my  right to future care or treatment, and that the Practitioner or I may terminate the telemedicine visit at any time. I understand that I have the right to inspect all information obtained and/or recorded in the course of the telemedicine visit and may receive copies of available information for a reasonable fee.  I understand that some of the potential risks of receiving the Services via telemedicine include:  [name] Delay or interruption in medical evaluation due to technological equipment failure or disruption; . Information transmitted may not be sufficient (e.g. poor resolution of images) to allow for appropriate medical decision making by the Practitioner; and/or  . In rare instances, security protocols could fail, causing a breach of personal health information.  Furthermore, I acknowledge that it is my responsibility to provide information about my medical history, conditions and care that is complete and accurate to the best of my ability. I acknowledge that Practitioner's advice, recommendations, and/or decision may be based on factors not within their control, such as incomplete or inaccurate data provided by me or lack of visual representation. I understand that the practice of medicine is not an exact science and that Practitioner makes no warranties or guarantees regarding treatment outcomes. I acknowledge that I will receive a copy of this consent concurrently upon execution via email to the email address I last provided but may also request a printed copy by calling the office of [facility].    I understand that my insurance may be billed for this visit.   I have read or had this consent read to me. . I understand the contents of this consent, which adequately explains the benefits and risks of the Services being provided via telemedicine.  [name]  I have been provided ample opportunity to ask questions regarding this consent and the Services and have had my questions answered to my  satisfaction. . I give my informed consent for the services to be provided through the use of telemedicine in my medical care  By participating in this telemedicine visit I agree to the above.

## 2018-07-28 NOTE — Telephone Encounter (Signed)
   TELEPHONE CALL NOTE  This patient has been deemed a candidate for follow-up tele-health visit to limit community exposure during the Covid-19 pandemic. I spoke with the patient via phone to discuss instructions. The patient was advised to review the section on consent for treatment as well. The patient will receive a phone call 2-3 days prior to their E-Visit at which time consent will be verbally confirmed. A Virtual Office Visit appointment type has been scheduled for 07/30/2018 with Darylene Price FNP.  [name], CMA 07/28/2018 12:34 PM

## 2018-07-28 NOTE — Telephone Encounter (Signed)
Ms. Rathmann called the office to report a weight gain of 5 lbs in 2 days. She reports increased edema and shortness of breath. Denies any active chest pain. She is currently taking 60 mg of Torsemide twice daily.   Darylene Price FNP informed of weight gain and will send in prescription for Metolazone 2.5 mg for the next 3 days. Patient is to increase potassium while taking the metolazone.   Inquired about home health services and patient states that she has tried to get home health before but can not due to her living arrangements. Seems like she is living with family members that do not want to allow them in the home. It may be possible to get [facility] with [facility] out [facility].   Ms. Carl will also need blood work drawn after taking the metolazone. We will call her back to advise which lab to use due to the current [place]. I advised Ms. Esteve of everything stated above and she verbalized understanding and is aware we will contact her about lab work, she will have a follow up via telephone visit on Friday to ensure symptoms improve.

## 2018-07-29 ENCOUNTER — Telehealth: Payer: Self-pay

## 2018-07-29 NOTE — Telephone Encounter (Signed)
Virtual Visit Pre-Appointment Phone Call  Steps For Call:  1. Confirm consent - "In the setting of the current Covid19 crisis, you are scheduled for a TELEPHONE visit with your provider on 08/17/2018 at 9:40AM.  Just as we do with many in-office visits, in order for you to participate in this visit, we must obtain consent.  If you'd like, I can send this to your mychart (if signed up) or email for you to review.  Otherwise, I can obtain your verbal consent now.  All virtual visits are billed to your insurance company just like a normal visit would be.  By agreeing to a virtual visit, we'd like you to understand that the technology does not allow for your provider to perform an examination, and thus may limit your provider's ability to fully assess your condition.  Finally, though the technology is pretty good, we cannot assure that it will always work on either your or our end, and in the setting of a video visit, we may have to convert it to a phone-only visit.  In either situation, we cannot ensure that we have a secure connection.  Are you willing to proceed?"  2. Give patient instructions for WebEx download to smartphone as below if video visit  3. Advise patient to be prepared with any vital sign or heart rhythm information, their current medicines, and a piece of paper and pen handy for any instructions they may receive the day of their visit  4. Inform patient they will receive a phone call 15 minutes prior to their appointment time (may be from unknown caller ID) so they should be prepared to answer  5. Confirm that appointment type is correct in Epic appointment notes (video vs telephone)    TELEPHONE CALL NOTE  Tina Hayes has been deemed a candidate for a follow-up tele-health visit to limit community exposure during the Covid-19 pandemic. I spoke with the patient via phone to ensure availability of phone/video source, confirm preferred email & phone number, and discuss  instructions and expectations.  I reminded Tina Hayes to be prepared with any vital sign and/or heart rhythm information that could potentially be obtained via home monitoring, at the time of her visit. I reminded Tina Hayes to expect a phone call at the time of her visit if her visit.  Did the patient verbally acknowledge consent to treatment? YES  Tina Hayes, [place] 07/29/2018 12:39 PM  CONSENT FOR TELE-HEALTH VISIT - PLEASE REVIEW  I hereby voluntarily request, consent and authorize [facility] and its employed or contracted physicians, physician assistants, nurse practitioners or other licensed health care professionals (the Practitioner), to provide me with telemedicine health care services (the Services") as deemed necessary by the treating Practitioner. I acknowledge and consent to receive the Services by the Practitioner via telemedicine. I understand that the telemedicine visit will involve communicating with the Practitioner through live audiovisual communication technology and the disclosure of certain medical information by electronic transmission. I acknowledge that I have been given the opportunity to request an in-person assessment or other available alternative prior to the telemedicine visit and am voluntarily participating in the telemedicine visit.  I understand that I have the right to withhold or withdraw my consent to the use of telemedicine in the course of my care at any time, without affecting my right to future care or treatment, and that the Practitioner or I may terminate the telemedicine visit at any time. I understand that I have  the right to inspect all information obtained and/or recorded in the course of the telemedicine visit and may receive copies of available information for a reasonable fee.  I understand that some of the potential risks of receiving the Services via telemedicine include:   Delay or interruption in medical evaluation due to  technological equipment failure or disruption;  Information transmitted may not be sufficient (e.g. poor resolution of images) to allow for appropriate medical decision making by the Practitioner; and/or   In rare instances, security protocols could fail, causing a breach of personal health information.  Furthermore, I acknowledge that it is my responsibility to provide information about my medical history, conditions and care that is complete and accurate to the best of my ability. I acknowledge that Practitioner's advice, recommendations, and/or decision may be based on factors not within their control, such as incomplete or inaccurate data provided by me or distortions of diagnostic images or specimens that may result from electronic transmissions. I understand that the practice of medicine is not an exact science and that Practitioner makes no warranties or guarantees regarding treatment outcomes. I acknowledge that I will receive a copy of this consent concurrently upon execution via email to the email address I last provided but may also request a printed copy by calling the office of Tina Hayes.    I understand that my insurance will be billed for this visit.   I have read or had this consent read to me.  I understand the contents of this consent, which adequately explains the benefits and risks of the Services being provided via telemedicine.   I have been provided ample opportunity to ask questions regarding this consent and the Services and have had my questions answered to my satisfaction.  I give my informed consent for the services to be provided through the use of telemedicine in my medical care  By participating in this telemedicine visit I agree to the above.

## 2018-07-30 ENCOUNTER — Other Ambulatory Visit: Payer: Self-pay

## 2018-07-30 ENCOUNTER — Ambulatory Visit: Payer: Medicare HMO | Attending: Family | Admitting: Family

## 2018-07-30 ENCOUNTER — Encounter: Payer: Self-pay | Admitting: Family

## 2018-07-30 VITALS — Wt 300.0 lb

## 2018-07-30 DIAGNOSIS — I5032 Chronic diastolic (congestive) heart failure: Secondary | ICD-10-CM

## 2018-07-30 DIAGNOSIS — Z794 Long term (current) use of insulin: Secondary | ICD-10-CM

## 2018-07-30 DIAGNOSIS — E119 Type 2 diabetes mellitus without complications: Secondary | ICD-10-CM

## 2018-07-30 NOTE — Progress Notes (Signed)
Virtual Visit via Telephone Note     Evaluation Performed:  Follow-up visit  This visit type was conducted due to national recommendations for restrictions regarding the COVID-19 Pandemic (e.g. social distancing).  This format is felt to be most appropriate for this patient at this time.  All issues noted in this document were discussed and addressed.  No physical exam was performed (except for noted visual exam findings with Video Visits).  Please refer to the patient's chart (MyChart message for video visits and phone note for telephone visits) for the patient's consent to telehealth for [facility]  Date:  07/30/2018   ID:  [name], DOB [birth year], MRN [id number]  Patient Location:  [address]  [place] [address]   Provider location:   [facility] [place] [address], [place] [address]  PCP:  Herminio Commons, MD  Cardiologist:  Ida Rogue, MD Electrophysiologist:  None   Chief Complaint: shortness of breath  History of Present Illness:    MALEEYA PETERKIN is a 73 y.o. female who presents via audio/video conferencing for a telehealth visit today. Patient verified her DOB and physical address.   The patient does not have symptoms concerning for COVID-19 infection (fever, chills, cough, or new SHORTNESS OF BREATH).   Patient describes minimal shortness of breath upon moderate exertion. She says that this has been chronic in nature having been present for several years. She says that her breathing is much better since she started the metolazone. She has associated fatigue, pedal edema (improving), abdominal swelling (improving) and chronic back pain along with this. She denies any weight gain, difficulty sleeping, sore throat, cough, chest pain, palpitations or dizziness. She says that she's lost 6 pounds in the last 2 days. She has been taking extra potassium with the metolazone and has one more dose of metolazone to take this  morning.   Prior CV studies:   The following studies were reviewed today:  Echo report from 06/17/16 reviewed and showed an EF of 60-65% along with mild MR and moderate TR.   Past Medical History:  Diagnosis Date  . (HFpEF) heart failure with preserved ejection fraction ([place])    a. 05/2016 Echo: EF 60-65%, mild to mod LVH, Gr1 DD, mild MR, mildly dil LA, mod TR, mildly to mod increased PASP.  [name] Acute diastolic heart failure ([place]) 01/27/2017  . Anxiety   . Chest pain 06/16/2016  . CHF (congestive heart failure) ([place])   . Chronic back pain   . Chronic diastolic congestive heart failure ([place]) 02/13/2017  . COPD (chronic obstructive pulmonary disease) ([place])   . Coronary artery disease    a. s/p remote PCI x 5;  b. 2006 s/p CABG x 3 ([place], [place]); b. 05/2016 MV: attenuation corrected images w/o ischemia or wma-->Med rx.  . Coronary artery disease of native artery of native heart with stable angina pectoris ([place]) 06/17/2016  . Depression   . Diabetes mellitus without complication ([place])   . Essential hypertension 06/30/2016  . Heart attack ([place])    Total of 3 per pt.  . Hypertension   . Hypertensive urgency 06/03/2015  . Seizure ([place])   . Seizures ([place])    Past Surgical History:  Procedure Laterality Date  . ABDOMINAL HYSTERECTOMY    . ANKLE SURGERY Right   . CATARACT EXTRACTION W/ INTRAOCULAR LENS  IMPLANT, BILATERAL    . COLONOSCOPY WITH PROPOFOL N/A 05/04/2017  Procedure: COLONOSCOPY WITH PROPOFOL;  Surgeon: [name], MD;  Location: [facility] ENDOSCOPY;  Service: Endoscopy;  Laterality: N/A;  . CORONARY ANGIOPLASTY    . CORONARY ARTERY BYPASS GRAFT     TRIPLE BYPASS  . ESOPHAGOGASTRODUODENOSCOPY (EGD) WITH PROPOFOL N/A 05/04/2017   Procedure: ESOPHAGOGASTRODUODENOSCOPY (EGD) WITH PROPOFOL;  Surgeon: [name], MD;  Location: [facility] ENDOSCOPY;  Service: Endoscopy;  Laterality: N/A;  . EXCISION MASS LOWER EXTREMETIES Right 03/02/2018   Procedure:  EXCISION SOFT TISSUE MASS FROM MEDIAL ASPECT OF RIGHT ANKLE;  Surgeon: [name], MD;  Location: [facility] ORS;  Service: Orthopedics;  Laterality: Right;  . FRACTURE SURGERY    . KNEE SURGERY Right   . MOUTH SURGERY Left 07/14/2017     Current Meds  Medication Sig  . albuterol (PROVENTIL HFA;VENTOLIN HFA) 108 (90 Base) MCG/ACT inhaler Inhale 2 puffs into the lungs every 6 (six) hours as needed for wheezing or shortness of breath.  [name] albuterol (PROVENTIL) (2.5 MG/3ML) 0.083% nebulizer solution Take 2.5 mg every 4 (four) hours as needed by nebulization for wheezing or shortness of breath.  [name] amLODipine (NORVASC) 10 MG tablet Take 10 mg by mouth daily.  [name] atorvastatin (LIPITOR) 40 MG tablet Take 40 mg at bedtime by mouth.   . Calcium Carbonate-Vitamin D3 (CALCIUM 600-D) 600-400 MG-UNIT TABS Take 1 tablet by mouth daily.   . carvedilol (COREG) 3.125 MG tablet Take 1 tablet (3.125 mg total) by mouth 2 (two) times daily with a meal.  . cetirizine (ZYRTEC) 10 MG tablet Take 10 mg by mouth daily.  . clopidogrel (PLAVIX) 75 MG tablet Take 1 tablet (75 mg total) by mouth daily.  . famotidine (PEPCID) 20 MG tablet Take 40 mg by mouth Nightly.  [name] HYDROcodone-acetaminophen (NORCO/VICODIN) 5-325 MG tablet Take 1 tablet by mouth every 6 (six) hours as needed for up to 30 days for moderate pain or severe pain.  [name] insulin aspart (NOVOLOG) 100 UNIT/ML injection Inject 10 Units into the skin 3 (three) times daily.   . insulin glargine (LANTUS) 100 UNIT/ML injection Inject 30-40 Units into the skin See admin instructions. Inject 30 units in the morning at 40 units at bedtime.  . isosorbide mononitrate (IMDUR) 30 MG 24 hr tablet Take 1 tablet (30 mg total) by mouth 2 (two) times daily.  [name] levETIRAcetam (KEPPRA) 750 MG tablet Take 750 mg by mouth 2 (two) times daily.  [name] losartan (COZAAR) 100 MG tablet Take 100 mg by mouth daily.  . meloxicam (MOBIC) 7.5 MG tablet Take 7.5 mg by mouth daily.  . metolazone  (ZAROXOLYN) 2.5 MG tablet Take 1 tablet (2.5 mg total) by mouth daily for 3 days. Take extra potassium as directed  . montelukast (SINGULAIR) 10 MG tablet Take 10 mg by mouth daily.   . nitroGLYCERIN (NITROSTAT) 0.4 MG SL tablet DISSOLVE ONE TABLET UNDER THE TONGUE EVERY 5 MINUTES AS NEEDED FOR CHEST PAIN.  DO NOT EXCEED A TOTAL OF 3 DOSES IN 15 MINUTES  . potassium chloride SA (K-DUR,KLOR-CON) 20 MEQ tablet Take 2 tablets (40 mEq total) by mouth 3 (three) times daily. Take extra 2 tablets (40 meq) if you take metolazone (Patient taking differently: Take 40 mEq by mouth 2 (two) times daily. Take extra 2 tablets (40 meq) if you take metolazone)  . sertraline (ZOLOFT) 50 MG tablet Take 50 mg by mouth daily.  [name] tiotropium (SPIRIVA) 18 MCG inhalation capsule Place 18 mcg into inhaler and inhale daily.  [name] torsemide (DEMADEX) 20 MG tablet  Take 2 tablets (40 mg total) by mouth 2 (two) times daily. (Patient taking differently: Take 60 mg by mouth 2 (two) times daily. )  . traZODone (DESYREL) 50 MG tablet Take 50 mg by mouth at bedtime.     Allergies:   Aspirin   Social History   Tobacco Use  . Smoking status: Former Smoker    Packs/day: 0.25    Years: 20.00    Pack years: 5.00    Types: Cigarettes    Last attempt to quit: 07/01/2006    Years since quitting: 12.0  . Smokeless tobacco: Never Used  Substance Use Topics  . Alcohol use: No    Alcohol/week: 0.0 standard drinks  . Drug use: No     Family Hx: The patient's family history includes Cancer in her sister; Depression in her mother; Diabetes in her brother, mother, and another family member; Heart attack in her mother and sister; Heart disease in her mother; Heart failure in her mother and sister; Hypertension in her brother, mother, sister, and another family member; SIDS in her sister; Stroke in her mother.  ROS:   Please see the history of present illness.     All other systems reviewed and are negative.   Labs/Other Tests and Data  Reviewed:    Recent Labs: 05/26/2018: ALT 28 05/28/2018: Hemoglobin 9.9; Magnesium 2.1; Platelets 238 07/01/2018: BUN 15; Creatinine, Ser 1.02; Potassium 3.7; Sodium 143   Recent Lipid Panel Lab Results  Component Value Date/Time   CHOL 120 01/30/2017 04:04 AM   TRIG 73 01/30/2017 04:04 AM   HDL 47 01/30/2017 04:04 AM   CHOLHDL 2.6 01/30/2017 04:04 AM   LDLCALC 58 01/30/2017 04:04 AM    Wt Readings from Last 3 Encounters:  07/30/18 300 lb (136.1 kg)  07/14/18 297 lb (134.7 kg)  07/01/18 (!) 302 lb 4 oz (137.1 kg)     Exam:    Vital Signs:  Wt 300 lb (136.1 kg) Comment: self-reported  BMI 41.84 kg/m    Well nourished, well developed female in no  acute distress.   ASSESSMENT & PLAN:    1. Chronic heart failure with preserved ejection fraction- - NYHA class II - euvolemic per patient's description - weighing daily and says that she's lost 6 pounds since taking 2 doses of metolazone. Reminded to call for an overnight weight gain of >2 pounds or a weekly weight gain of >5 pounds - patient will schedule transportation to come to pre-admission testing to get lab work drawn next week as she has a history of hypokalemia - not adding salt and is trying to eat low sodium foods - saw cardiology Rockey Situ) 11/09/17  2: Diabetes- - she reports a glucose of 126 this morning - BMP 07/01/2018 reviewed and showed sodium 143, potassium 3.7, creatinine 1.02 and GFR >60 - A1c 05/27/2018 was 7.5%  COVID-19 Education: The signs and symptoms of COVID-19 were discussed with the patient and how to seek care for testing (follow up with PCP or arrange E-visit).  The importance of social distancing was discussed today.  Patient Risk:   After full review of this patients clinical status, I feel that they are at least moderate risk at this time.  Time:   Today, I have spent 20 minutes with the patient with telehealth technology discussing medications, daily weight and symptoms to call  about.   Medication Adjustments/Labs and Tests Ordered: Current medicines are reviewed at length with the patient today.  Concerns regarding medicines are outlined above.  Tests Ordered: No orders of the defined types were placed in this encounter.  Medication Changes: No orders of the defined types were placed in this encounter.   Disposition: Follow-up in 2 months or sooner for any questions/problems before then.   Signed, Alisa Graff, FNP  07/30/2018 9:45 AM    [facility]

## 2018-07-30 NOTE — Patient Instructions (Signed)
Continue weighing daily and call for an overnight weight gain of > 2 pounds or a weekly weight gain of >5 pounds. 

## 2018-08-03 ENCOUNTER — Other Ambulatory Visit: Payer: Self-pay | Admitting: Family

## 2018-08-03 DIAGNOSIS — I5032 Chronic diastolic (congestive) heart failure: Secondary | ICD-10-CM

## 2018-08-09 ENCOUNTER — Other Ambulatory Visit
Admission: RE | Admit: 2018-08-09 | Discharge: 2018-08-09 | Disposition: A | Discharge status: Home / Self Care | Payer: Medicare HMO | Source: Ambulatory Visit | Attending: Family | Admitting: Family

## 2018-08-09 DIAGNOSIS — I5032 Chronic diastolic (congestive) heart failure: Secondary | ICD-10-CM

## 2018-08-09 LAB — BASIC METABOLIC PANEL WITH GFR
Anion gap: 7 (ref 5–15)
BUN: 35 mg/dL — ABNORMAL HIGH (ref 8–23)
CO2: 26 mmol/L (ref 22–32)
Calcium: 8.6 mg/dL — ABNORMAL LOW (ref 8.9–10.3)
Chloride: 109 mmol/L (ref 98–111)
Creatinine, Ser: 1.15 mg/dL — ABNORMAL HIGH (ref 0.44–1.00)
GFR calc Af Amer: 56 mL/min — ABNORMAL LOW
GFR calc non Af Amer: 48 mL/min — ABNORMAL LOW
Glucose, Bld: 177 mg/dL — ABNORMAL HIGH (ref 70–99)
Potassium: 4 mmol/L (ref 3.5–5.1)
Sodium: 142 mmol/L (ref 135–145)

## 2018-08-12 ENCOUNTER — Other Ambulatory Visit: Payer: Self-pay

## 2018-08-12 ENCOUNTER — Ambulatory Visit: Payer: Medicare HMO | Attending: Anesthesiology | Admitting: Anesthesiology

## 2018-08-12 DIAGNOSIS — M5432 Sciatica, left side: Secondary | ICD-10-CM

## 2018-08-12 DIAGNOSIS — M48062 Spinal stenosis, lumbar region with neurogenic claudication: Secondary | ICD-10-CM | POA: Diagnosis not present

## 2018-08-12 DIAGNOSIS — M5431 Sciatica, right side: Secondary | ICD-10-CM

## 2018-08-12 DIAGNOSIS — M5136 Other intervertebral disc degeneration, lumbar region: Secondary | ICD-10-CM

## 2018-08-12 DIAGNOSIS — M4716 Other spondylosis with myelopathy, lumbar region: Secondary | ICD-10-CM

## 2018-08-12 DIAGNOSIS — M170 Bilateral primary osteoarthritis of knee: Secondary | ICD-10-CM

## 2018-08-12 DIAGNOSIS — M47817 Spondylosis without myelopathy or radiculopathy, lumbosacral region: Secondary | ICD-10-CM

## 2018-08-12 DIAGNOSIS — F1190 Opioid use, unspecified, uncomplicated: Secondary | ICD-10-CM

## 2018-08-12 DIAGNOSIS — G894 Chronic pain syndrome: Secondary | ICD-10-CM

## 2018-08-12 MED ORDER — HYDROCODONE-ACETAMINOPHEN 5-325 MG PO TABS: 1.0000 | ORAL_TABLET | Freq: Four times a day (QID) | ORAL | 0 refills | Status: AC | PRN

## 2018-08-12 NOTE — Progress Notes (Signed)
Virtual Visit via Telephone Note  I connected with Tina Hayes on 08/12/18 at  3:30 PM EDT by telephone and verified that I am speaking with the correct person using two identifiers.   I discussed the limitations, risks, security and privacy concerns of performing an evaluation and management service by telephone and the availability of in person appointments. I also discussed with the patient that there may be a patient responsible charge related to this service. The patient expressed understanding and agreed to proceed.   History of Present Illness: I spoke with Tina Hayes over the phone today regarding her current situation with her low back pain and leg pain.  She has had some recurrence of the lower leg pain similar to what she had several months ago.  She had an epidural recently that gave her good relief.  And she desires to proceed with a repeat injection.  Otherwise she has been in her usual state of health and taking her hydrocodone for her chronic low back pain 3 times a day.  That is working well for her which she reports.  The quality characteristic and distribution of her pain is otherwise been stable.  Based on her findings today the medications give her good relief with no untoward side effects.  She still doing her stretching strengthening exercises as previously prescribed.    Observations/Objective:   Assessment and Plan: 1. Bilateral sciatica   2. DDD (degenerative disc disease), lumbar   3. Spinal stenosis of lumbar region with neurogenic claudication   4. Lumbar spondylosis with myelopathy   5. Facet arthritis of lumbosacral region   6. Primary osteoarthritis of both knees   7. Chronic pain syndrome   8. Chronic, continuous use of opioids   Will have her continue with her current regimen.  I have reviewed the practitioner database information for [facility] and it is appropriate.  Refill will be written today for May 16 for 30-day supply and she has a 417  prescription outstanding.  We will have her return to clinic in 1 month and schedule her for a repeat epidural as these are working well in keeping her lower extremity pain and back pain under decent control.  I want her to continue efforts at weight loss stretching strengthening and continue follow-up with her primary care physician.   Follow Up Instructions:    I discussed the assessment and treatment plan with the patient. The patient was provided an opportunity to ask questions and all were answered. The patient agreed with the plan and demonstrated an understanding of the instructions.   The patient was advised to call back or seek an in-person evaluation if the symptoms worsen or if the condition fails to improve as anticipated.  I provided 20 minutes of non-face-to-face time during this encounter.   Molli Barrows, MD

## 2018-08-16 NOTE — Progress Notes (Signed)
Virtual Visit via Video Note   This visit type was conducted due to national recommendations for restrictions regarding the COVID-19 Pandemic (e.g. social distancing) in an effort to limit this patient's exposure and mitigate transmission in our community.  Due to her co-morbid illnesses, this patient is at least at moderate risk for complications without adequate follow up.  This format is felt to be most appropriate for this patient at this time.  All issues noted in this document were discussed and addressed.  A limited physical exam was performed with this format.  Please refer to the patient's chart for her consent to telehealth for [facility].   I connected with  [name] on 08/16/18 by a video enabled telemedicine application and verified that I am speaking with the correct person using two identifiers. I discussed the limitations of evaluation and management by telemedicine. The patient expressed understanding and agreed to proceed.   Evaluation Performed:  Follow-up visit  Date:  08/16/2018   ID:  Naelle, Diegel 16-Jul-1945, MRN [id number]  Patient Location:  [address]  [place] [address]   Provider location:   [facility], [place] office  PCP:  Herminio Commons, MD  Cardiologist:  Arvid Right [facility]  Chief Complaint:  Leg swelling, knee pain   History of Present Illness:    Tina Hayes is a 73 y.o. female who presents via audio/video conferencing for a telehealth visit today.   The patient does not symptoms concerning for COVID-19 infection (fever, chills, cough, or new SHORTNESS OF BREATH).   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, 24 to 73 yo  Chronic back pain,   HTN  seizure disorder  hospital admission 06/17/2016 for cough, chest pain, shortness of breath,  stress test  showing no ischemia  felt to be atypical in nature, pain at rest, reproducible on palpation who presents  for follow-up of her chest pain, chronic diastolic CHf  Last appointment with myself July 2019 Some noncompliance with her torsemide In general takes torsemide 60 daily with occasional metolazone Goal weight 310  Weight down to 297 Torsemide 60 BID, potassium 2 BID   BMP Latest Ref Rng & Units 08/09/2018 07/01/2018 05/28/2018  Glucose 70 - 99 mg/dL 177(H) 123(H) 183(H)  BUN 8 - 23 mg/dL 35(H) 15 27(H)  Creatinine 0.44 - 1.00 mg/dL 1.15(H) 1.02(H) 0.94  Sodium 135 - 145 mmol/L 142 143 138  Potassium 3.5 - 5.1 mmol/L 4.0 3.7 3.0(L)  Chloride 98 - 111 mmol/L 109 106 103  CO2 22 - 32 mmol/L 26 27 27   Calcium 8.9 - 10.3 mg/dL 8.6(L) 9.1 8.6(L)   On last clinic visit had several seizures, managed by neurology  Shortness of breath stable For COPD has nebs, albuterol and spiriva  Needs knee surgery ACL surgery  Lost her brother, he had an MI He was 63  Lab work Total chol 99, LDL 40 HBA1C 7.5, down 9   Other past medical history reviewed with her  hospital October 2018 for chest pain shortness of breath Felt to be atypical in nature, GI related, piece of meat stuck in her throat with pain radiating to both shoulders, .  Vomiting, unable to get any food or water down  Eventually piece of meat removed and  she was able to swallow  history of GERD, she had ran out of her PPI   Prior CV studies:   The following studies were reviewed today:    Past Medical History:  Diagnosis Date  . (HFpEF) heart failure with preserved ejection fraction ([place])    a. 05/2016 Echo: EF 60-65%, mild to mod LVH, Gr1 DD, mild MR, mildly dil LA, mod TR, mildly to mod increased PASP.  [name] Acute diastolic heart failure ([place]) 01/27/2017  . Anxiety   . Chest pain 06/16/2016  . CHF (congestive heart failure) ([place])   . Chronic back pain   . Chronic diastolic congestive heart failure ([place]) 02/13/2017   . COPD (chronic obstructive pulmonary disease) ([place])   . Coronary artery disease    a. s/p remote PCI x 5;  b. 2006 s/p CABG x 3 ([place]); b. 05/2016 MV: attenuation corrected images w/o ischemia or wma-->Med rx.  . Coronary artery disease of native artery of native heart with stable angina pectoris ([place]) 06/17/2016  . Depression   . Diabetes mellitus without complication ([place])   . Essential hypertension 06/30/2016  . Heart attack ([place])    Total of 3 per pt.  . Hypertension   . Hypertensive urgency 06/03/2015  . Seizure ([place])   . Seizures ([place])    Past Surgical History:  Procedure Laterality Date  . ABDOMINAL HYSTERECTOMY    . ANKLE SURGERY Right   . CATARACT EXTRACTION W/ INTRAOCULAR LENS  IMPLANT, BILATERAL    . COLONOSCOPY WITH PROPOFOL N/A 05/04/2017   Procedure: COLONOSCOPY WITH PROPOFOL;  Surgeon: [name], MD;  Location: [facility] ENDOSCOPY;  Service: Endoscopy;  Laterality: N/A;  . CORONARY ANGIOPLASTY    . CORONARY ARTERY BYPASS GRAFT     TRIPLE BYPASS  . ESOPHAGOGASTRODUODENOSCOPY (EGD) WITH PROPOFOL N/A 05/04/2017   Procedure: ESOPHAGOGASTRODUODENOSCOPY (EGD) WITH PROPOFOL;  Surgeon: [name], MD;  Location: [facility] ENDOSCOPY;  Service: Endoscopy;  Laterality: N/A;  . EXCISION MASS LOWER EXTREMETIES Right 03/02/2018   Procedure: EXCISION SOFT TISSUE MASS FROM MEDIAL ASPECT OF RIGHT ANKLE;  Surgeon: [name], MD;  Location: [facility] ORS;  Service: Orthopedics;  Laterality: Right;  . FRACTURE SURGERY    . KNEE SURGERY Right   . MOUTH SURGERY Left 07/14/2017     No outpatient medications have been marked as taking for the 08/17/18 encounter (Appointment) with Minna Merritts, MD.     Allergies:   Aspirin   Social History   Tobacco Use  . Smoking status: Former Smoker    Packs/day: 0.25    Years: 20.00    Pack years: 5.00    Types: Cigarettes    Last attempt to quit: 07/01/2006    Years since quitting: 12.1  . Smokeless  tobacco: Never Used  Substance Use Topics  . Alcohol use: No    Alcohol/week: 0.0 standard drinks  . Drug use: No     Current Outpatient Medications on File Prior to Visit  Medication Sig Dispense Refill  . albuterol (PROVENTIL HFA;VENTOLIN HFA) 108 (90 Base) MCG/ACT inhaler Inhale 2 puffs into the lungs every 6 (six) hours as needed for wheezing or shortness of breath. 1 Inhaler 2  . albuterol (PROVENTIL) (2.5 MG/3ML) 0.083% nebulizer solution Take 2.5 mg every 4 (four) hours as needed by nebulization for wheezing or shortness of breath.    [name] amLODipine (NORVASC) 10 MG tablet Take 10 mg by mouth daily.    [name] atorvastatin (LIPITOR) 40 MG  tablet Take 40 mg at bedtime by mouth.     . Calcium Carbonate-Vitamin D3 (CALCIUM 600-D) 600-400 MG-UNIT TABS Take 1 tablet by mouth daily.     . carvedilol (COREG) 3.125 MG tablet Take 1 tablet (3.125 mg total) by mouth 2 (two) times daily with a meal. 180 tablet 3  . cetirizine (ZYRTEC) 10 MG tablet Take 10 mg by mouth daily.    . clopidogrel (PLAVIX) 75 MG tablet Take 1 tablet (75 mg total) by mouth daily. 30 tablet 0  . famotidine (PEPCID) 20 MG tablet Take 40 mg by mouth Nightly.    [name] HYDROcodone-acetaminophen (NORCO/VICODIN) 5-325 MG tablet Take 1 tablet by mouth every 6 (six) hours as needed for up to 30 days for moderate pain or severe pain. 90 tablet 0  . [START ON 09/11/2018] HYDROcodone-acetaminophen (NORCO/VICODIN) 5-325 MG tablet Take 1 tablet by mouth every 6 (six) hours as needed for up to 30 days for moderate pain or severe pain. 90 tablet 0  . insulin aspart (NOVOLOG) 100 UNIT/ML injection Inject 10 Units into the skin 3 (three) times daily.     . insulin glargine (LANTUS) 100 UNIT/ML injection Inject 30-40 Units into the skin See admin instructions. Inject 30 units in the morning at 40 units at bedtime.    . isosorbide mononitrate (IMDUR) 30 MG 24 hr tablet Take 1 tablet (30 mg total) by mouth 2 (two) times daily. 180 tablet 3  . levETIRAcetam  (KEPPRA) 750 MG tablet Take 750 mg by mouth 2 (two) times daily.    [name] losartan (COZAAR) 100 MG tablet Take 100 mg by mouth daily.    . meloxicam (MOBIC) 7.5 MG tablet Take 7.5 mg by mouth daily.    . metolazone (ZAROXOLYN) 2.5 MG tablet Take 1 tablet (2.5 mg total) by mouth daily for 3 days. Take extra potassium as directed 3 tablet 0  . montelukast (SINGULAIR) 10 MG tablet Take 10 mg by mouth daily.     . nitroGLYCERIN (NITROSTAT) 0.4 MG SL tablet DISSOLVE ONE TABLET UNDER THE TONGUE EVERY 5 MINUTES AS NEEDED FOR CHEST PAIN.  DO NOT EXCEED A TOTAL OF 3 DOSES IN 15 MINUTES 25 tablet 0  . potassium chloride SA (K-DUR,KLOR-CON) 20 MEQ tablet Take 2 tablets (40 mEq total) by mouth 3 (three) times daily. Take extra 2 tablets (40 meq) if you take metolazone (Patient taking differently: Take 40 mEq by mouth 2 (two) times daily. Take extra 2 tablets (40 meq) if you take metolazone) 120 tablet 3  . sertraline (ZOLOFT) 50 MG tablet Take 50 mg by mouth daily.    [name] tiotropium (SPIRIVA) 18 MCG inhalation capsule Place 18 mcg into inhaler and inhale daily.    [name] torsemide (DEMADEX) 20 MG tablet Take 2 tablets (40 mg total) by mouth 2 (two) times daily. (Patient taking differently: Take 60 mg by mouth 2 (two) times daily. ) 180 tablet 0  . traZODone (DESYREL) 50 MG tablet Take 50 mg by mouth at bedtime.     No current facility-administered medications on file prior to visit.      Family Hx: The patient's family history includes Cancer in her sister; Depression in her mother; Diabetes in her brother, mother, and another family member; Heart attack in her mother and sister; Heart disease in her mother; Heart failure in her mother and sister; Hypertension in her brother, mother, sister, and another family member; SIDS in her sister; Stroke in her mother.  ROS:   Please see the  history of present illness.    Review of Systems  Constitutional: Negative.   Respiratory: Negative.   Cardiovascular: Negative.    Gastrointestinal: Negative.   Musculoskeletal: Positive for joint pain.  Neurological: Negative.   Psychiatric/Behavioral: Negative.   All other systems reviewed and are negative.    Labs/Other Tests and Data Reviewed:    Recent Labs: 05/26/2018: ALT 28 05/28/2018: Hemoglobin 9.9; Magnesium 2.1; Platelets 238 08/09/2018: BUN 35; Creatinine, Ser 1.15; Potassium 4.0; Sodium 142   Recent Lipid Panel Lab Results  Component Value Date/Time   CHOL 120 01/30/2017 04:04 AM   TRIG 73 01/30/2017 04:04 AM   HDL 47 01/30/2017 04:04 AM   CHOLHDL 2.6 01/30/2017 04:04 AM   LDLCALC 58 01/30/2017 04:04 AM    Wt Readings from Last 3 Encounters:  07/30/18 300 lb (136.1 kg)  07/14/18 297 lb (134.7 kg)  07/01/18 (!) 302 lb 4 oz (137.1 kg)     Exam:    Vital Signs: Vital signs may also be detailed in the HPI There were no vitals taken for this visit.  Wt Readings from Last 3 Encounters:  07/30/18 300 lb (136.1 kg)  07/14/18 297 lb (134.7 kg)  07/01/18 (!) 302 lb 4 oz (137.1 kg)   Temp Readings from Last 3 Encounters:  07/14/18 98.6 F (37 C)  05/28/18 98 F (36.7 C) (Oral)  04/05/18 98.2 F (36.8 C)   BP Readings from Last 3 Encounters:  07/14/18 (!) 162/80  07/01/18 108/60  06/11/18 92/60   Pulse Readings from Last 3 Encounters:  07/14/18 64  07/01/18 60  06/11/18 69    110/60, pulse 60 resp 16  Well nourished, well developed female in no acute distress. Constitutional:  oriented to person, place, and time. No distress.  Head: Normocephalic and atraumatic.  Eyes:  no discharge. No scleral icterus.  Neck: Normal range of motion. Neck supple.  Pulmonary/Chest: No audible wheezing, no distress, appears comfortable Musculoskeletal: Normal range of motion.  no  tenderness or deformity.  Neurological:   Coordination normal. Full exam not performed Skin:  No rash Psychiatric:  normal mood and affect. behavior is normal. Thought content normal.    ASSESSMENT & PLAN:     Chronic diastolic congestive heart failure (HCC) Euvolemic, overall feels well weight is well down 297 pounds, last year was 311 Stable renal function No changes to her medications We will send in refills  Type 2 diabetes mellitus without complication, with long-term current use of insulin (HCC) Hemoglobin A1c continues to drop from 9 down to 7.5 Complemented her on her dietary changes  Coronary artery disease of native artery of native heart with stable angina pectoris (HCC) Currently with no symptoms of angina. No further workup at this time. Continue current medication regimen.  Essential hypertension Blood pressure is well controlled on today's visit. No changes made to the medications.  Morbid obesity ([place]) We have encouraged continued exercise, careful diet management in an effort to lose weight.  Centrilobular emphysema (HCC) Prior smoking, stopped years ago Reports her breathing is stable   COVID-19 Education: The signs and symptoms of COVID-19 were discussed with the patient and how to seek care for testing (follow up with PCP or arrange E-visit).  The importance of social distancing was discussed today.  Patient Risk:   After full review of this patients clinical status, I feel that they are at least moderate risk at this time.  Time:   Today, I have spent 25 minutes with the patient with  telehealth technology discussing the cardiac and medical problems/diagnoses detailed above   10 min spent reviewing the chart prior to patient visit today   Medication Adjustments/Labs and Tests Ordered: Current medicines are reviewed at length with the patient today.  Concerns regarding medicines are outlined above.   Tests Ordered: No tests ordered   Medication Changes: No changes made   Disposition: Follow-up in 12 months   Signed, Ida Rogue, MD  08/16/2018 7:00 PM    [place] Office [address] [place], [place],  [place] [address]

## 2018-08-17 ENCOUNTER — Telehealth: Payer: Self-pay

## 2018-08-17 ENCOUNTER — Other Ambulatory Visit: Payer: Self-pay

## 2018-08-17 ENCOUNTER — Telehealth (INDEPENDENT_AMBULATORY_CARE_PROVIDER_SITE_OTHER): Payer: Medicare HMO | Admitting: Cardiovascular Disease

## 2018-08-17 VITALS — Wt 297.0 lb

## 2018-08-17 DIAGNOSIS — I5031 Acute diastolic (congestive) heart failure: Secondary | ICD-10-CM | POA: Diagnosis not present

## 2018-08-17 DIAGNOSIS — J432 Centrilobular emphysema: Secondary | ICD-10-CM

## 2018-08-17 DIAGNOSIS — I5032 Chronic diastolic (congestive) heart failure: Secondary | ICD-10-CM | POA: Diagnosis not present

## 2018-08-17 DIAGNOSIS — I10 Essential (primary) hypertension: Secondary | ICD-10-CM

## 2018-08-17 DIAGNOSIS — E119 Type 2 diabetes mellitus without complications: Secondary | ICD-10-CM

## 2018-08-17 DIAGNOSIS — I110 Hypertensive heart disease with heart failure: Secondary | ICD-10-CM

## 2018-08-17 DIAGNOSIS — I25118 Atherosclerotic heart disease of native coronary artery with other forms of angina pectoris: Secondary | ICD-10-CM | POA: Diagnosis not present

## 2018-08-17 DIAGNOSIS — Z794 Long term (current) use of insulin: Secondary | ICD-10-CM

## 2018-08-17 MED ORDER — CLOPIDOGREL BISULFATE 75 MG PO TABS: 75.0000 mg | ORAL_TABLET | Freq: Every day | ORAL | 11 refills | Status: DC

## 2018-08-17 MED ORDER — ISOSORBIDE MONONITRATE ER 30 MG PO TB24: 30.0000 mg | ORAL_TABLET | Freq: Two times a day (BID) | ORAL | 11 refills | Status: DC

## 2018-08-17 MED ORDER — TORSEMIDE 20 MG PO TABS: 60.0000 mg | ORAL_TABLET | Freq: Two times a day (BID) | ORAL | 11 refills | Status: DC

## 2018-08-17 MED ORDER — POTASSIUM CHLORIDE CRYS ER 20 MEQ PO TBCR: 40.0000 meq | EXTENDED_RELEASE_TABLET | Freq: Two times a day (BID) | ORAL | 11 refills | Status: DC

## 2018-08-17 MED ORDER — LOSARTAN POTASSIUM 100 MG PO TABS: 100.0000 mg | ORAL_TABLET | Freq: Every day | ORAL | 11 refills | Status: DC

## 2018-08-17 MED ORDER — METOLAZONE 2.5 MG PO TABS: 2.5000 mg | ORAL_TABLET | Freq: Every day | ORAL | 2 refills | Status: DC | PRN

## 2018-08-17 MED ORDER — CARVEDILOL 3.125 MG PO TABS: 3.1250 mg | ORAL_TABLET | Freq: Two times a day (BID) | ORAL | 11 refills | Status: DC

## 2018-08-17 MED ORDER — ATORVASTATIN CALCIUM 40 MG PO TABS: 40.0000 mg | ORAL_TABLET | Freq: Every day | ORAL | 11 refills | Status: DC

## 2018-08-17 MED ORDER — AMLODIPINE BESYLATE 10 MG PO TABS: 10.0000 mg | ORAL_TABLET | Freq: Every day | ORAL | 11 refills | Status: DC

## 2018-08-17 NOTE — Telephone Encounter (Signed)
Called patient.  Reviewed medications and allergies with patient.  Patient does not have a BP cuff at home.

## 2018-08-17 NOTE — Telephone Encounter (Signed)
Contacted the patient to verify how she is taking her Potassium prior to refilling the prescription. Patient confirms she is taking Potassium (2) 20 mEq  Tabs twice daily, she takes an extra (2) 31mEq tabs on the days she take Metolazone.

## 2018-08-17 NOTE — Patient Instructions (Signed)
Medication Instructions:  [facility] refills Cardiac meds 90 days, x 3  If you need a refill on your cardiac medications before your next appointment, please call your pharmacy.    Lab work: No new labs needed   If you have labs (blood work) drawn today and your tests are completely normal, you will receive your results only by: [name] MyChart Message (if you have MyChart) OR . A paper copy in the mail If you have any lab test that is abnormal or we need to change your treatment, we will call you to review the results.   Testing/Procedures: No new testing needed   Follow-Up: At [facility], you and your health needs are our priority.  As part of our continuing mission to provide you with exceptional heart care, we have created designated Provider Care Teams.  These Care Teams include your primary Cardiologist (physician) and Advanced Practice Providers (APPs -  Physician Assistants and Nurse Practitioners) who all work together to provide you with the care you need, when you need it.  . You will need a follow up appointment in 12 months .   Please call our office 2 months in advance to schedule this appointment.    . Providers on your designated Care Team:   . Murray Hodgkins, NP . Christell Faith, PA-C . Marrianne Mood, PA-C  Any Other Special Instructions Will Be Listed Below (If Applicable).  For educational health videos Log in to : [email or web address] Or : [email or web address], password : [email or web address]

## 2018-08-31 ENCOUNTER — Encounter

## 2018-08-31 ENCOUNTER — Other Ambulatory Visit: Payer: Self-pay

## 2018-08-31 ENCOUNTER — Ambulatory Visit (INDEPENDENT_AMBULATORY_CARE_PROVIDER_SITE_OTHER): Payer: Medicare HMO | Admitting: Vascular Surgery

## 2018-08-31 ENCOUNTER — Encounter (INDEPENDENT_AMBULATORY_CARE_PROVIDER_SITE_OTHER): Payer: Self-pay | Admitting: Vascular Surgery

## 2018-08-31 VITALS — BP 106/67 | HR 62 | Resp 18 | Ht 71.0 in | Wt 302.0 lb

## 2018-08-31 DIAGNOSIS — I6509 Occlusion and stenosis of unspecified vertebral artery: Secondary | ICD-10-CM

## 2018-08-31 DIAGNOSIS — G450 Vertebro-basilar artery syndrome: Secondary | ICD-10-CM | POA: Diagnosis not present

## 2018-08-31 DIAGNOSIS — I10 Essential (primary) hypertension: Secondary | ICD-10-CM

## 2018-08-31 DIAGNOSIS — Z87891 Personal history of nicotine dependence: Secondary | ICD-10-CM

## 2018-08-31 DIAGNOSIS — Z794 Long term (current) use of insulin: Secondary | ICD-10-CM

## 2018-08-31 DIAGNOSIS — E119 Type 2 diabetes mellitus without complications: Secondary | ICD-10-CM | POA: Diagnosis not present

## 2018-08-31 NOTE — Patient Instructions (Signed)
Vertebrobasilar Disease  Vertebrobasilar disease is a condition in which too little blood reaches the back of the brain. Vertebrobasilar disease can cause symptoms that interfere with day-to-day life, such as double vision. It also increases the risk of stroke and transient ischemic attack (TIA). Vertebrobasilar disease is also called vertebrobasilar insufficiency. What are the causes? This condition may be caused by:  A blockage in the blood vessels that carry blood to the back of the brain (vertebrobasilar arteries).  A tear in a vertebrobasilar artery.  A condition that causes less blood to flow to the base of the brain, such as high blood pressure or diabetes. The condition can be triggered by:  Dehydration.  Sudden changes in blood pressure.  Sudden changes in position.  An injury.  Turning your head to an extreme position.  Overextending your neck. What increases the risk? This condition is more likely to develop in people who:  Are elderly.  Are female.  Are smokers.  Have high blood pressure, high cholesterol, obesity, or diabetes.  Have a family history of this condition. What are the signs or symptoms? Symptoms of this condition include:  Numbness or weakness in the arms or legs.  Sudden, severe dizziness (vertigo).  Suddenly falling down (drop attack).  Loss of coordination.  Vision problems, such as double vision or vision loss.  Nausea and vomiting.  Slurred speech.  Trouble swallowing.  Headaches that affect the back of the head. How is this diagnosed? This condition is diagnosed with a physical exam and tests. Tests may include:  An ultrasound.  A CT scan.  MRI. How is this treated? Treatment for this condition depends on the cause. It may involve taking blood-thinning medicine, such as aspirin, to reduce the risk of stroke. It may also involve lifestyle changes, such as:  Exercising.  Controlling your blood pressure, cholesterol, or  diabetes.  Quitting smoking. Treatment is usually started at a hospital. Follow these instructions at home:  Take over-the-counter and prescription medicines only as told by your health care provider.  Do not use any tobacco products, such as cigarettes, chewing tobacco, and e-cigarettes. If you need help quitting, ask your health care provider.  Keep all follow-up visits as told by your health care provider. This is important. Get help right away if:  Your symptoms return.  You have new symptoms. Symptoms of this condition may represent a serious problem that is an emergency. Do not wait to see if the symptoms will go away. Get medical help right away. Call your local emergency services (911 in the [place] Do not drive yourself to the hospital. This information is not intended to replace advice given to you by your health care provider. Make sure you discuss any questions you have with your health care provider. Document Released: 05/22/2004 Document Revised: 09/20/2015 Document Reviewed: 06/08/2014 Elsevier Interactive Patient Education  2019 Elsevier Inc.  

## 2018-08-31 NOTE — Assessment & Plan Note (Signed)
The patient has had a recent ultrasound suggesting significant vertebral artery disease.  I do not have the ultrasound for review, but ultrasound is not particularly reliable for vertebral artery disease given the location and the small size of the vessel.  I think a CT angiogram would be the best course of action for further evaluation to help determine the severity of the disease and potential treatment options.  We discussed that many of these lesions are treated medically and surgery or intervention are actually unusual for vertebral artery disease.  This may or may not be the cause of her posterior headaches as well.  We will see her back following the CT scan to discuss the results and determine further treatment options.

## 2018-08-31 NOTE — Assessment & Plan Note (Signed)
blood glucose control important in reducing the progression of atherosclerotic disease. Also, involved in wound healing. On appropriate medications.  

## 2018-08-31 NOTE — Progress Notes (Signed)
Patient ID: Tina Hayes, female   DOB: [birth year], 73 y.o.   MRN: [id number]  Chief Complaint  Patient presents with  . New Patient (Initial Visit)    ref Manuella Ghazi for vertebral artery stenosis    HPI Tina Hayes is a 73 y.o. female.  I am asked to see the patient by Dr. Manuella Ghazi for evaluation of vertebral artery stenosis.  The patient reports persistent posterior headaches which have been very difficult to alleviate.  She has a long history of seizure disorder having her first seizure almost 60 years ago.  She has these somewhat regularly.  She did have an episode a few months ago where it was different than her typical seizure.  When asked what happened she says I really have no idea it was just different.  She reports 30 to 60 minutes after the seizure she is back to her baseline.  No arm or leg weakness or numbness.  No speech or swallowing difficulty.  No temporary monocular blindness.  She underwent a recent ultrasound by her neurologist which demonstrated some vertebral artery disease.  I do not have the images and cannot seem to open the report to get the full details.  I also do not know if she had significant carotid disease or not.  With this finding, she is referred for further evaluation and treatment   Past Medical History:  Diagnosis Date  . (HFpEF) heart failure with preserved ejection fraction ([place])    a. 05/2016 Echo: EF 60-65%, mild to mod LVH, Gr1 DD, mild MR, mildly dil LA, mod TR, mildly to mod increased PASP.  [name] Acute diastolic heart failure ([place]) 01/27/2017  . Anxiety   . Chest pain 06/16/2016  . CHF (congestive heart failure) ([place])   . Chronic back pain   . Chronic diastolic congestive heart failure ([place]) 02/13/2017  . COPD (chronic obstructive pulmonary disease) ([place])   . Coronary artery disease    a. s/p remote PCI x 5;  b. 2006 s/p CABG x 3 ([place]); b. 05/2016 MV: attenuation corrected images w/o ischemia or wma-->Med rx.  .  Coronary artery disease of native artery of native heart with stable angina pectoris ([place]) 06/17/2016  . Depression   . Diabetes mellitus without complication ([place])   . Essential hypertension 06/30/2016  . Heart attack ([place])    Total of 3 per pt.  . Hypertension   . Hypertensive urgency 06/03/2015  . Seizure ([place])   . Seizures ([place])     Past Surgical History:  Procedure Laterality Date  . ABDOMINAL HYSTERECTOMY    . ANKLE SURGERY Right   . CATARACT EXTRACTION W/ INTRAOCULAR LENS  IMPLANT, BILATERAL    . COLONOSCOPY WITH PROPOFOL N/A 05/04/2017   Procedure: COLONOSCOPY WITH PROPOFOL;  Surgeon: [name], MD;  Location: [facility] ENDOSCOPY;  Service: Endoscopy;  Laterality: N/A;  . CORONARY ANGIOPLASTY    . CORONARY ARTERY BYPASS GRAFT     TRIPLE BYPASS  . ESOPHAGOGASTRODUODENOSCOPY (EGD) WITH PROPOFOL N/A 05/04/2017   Procedure: ESOPHAGOGASTRODUODENOSCOPY (EGD) WITH PROPOFOL;  Surgeon: [name], MD;  Location: [facility] ENDOSCOPY;  Service: Endoscopy;  Laterality: N/A;  . EXCISION MASS LOWER EXTREMETIES Right 03/02/2018   Procedure: EXCISION SOFT TISSUE MASS FROM MEDIAL ASPECT OF RIGHT ANKLE;  Surgeon: [name], MD;  Location: [facility] ORS;  Service: Orthopedics;  Laterality: Right;  . FRACTURE SURGERY    . KNEE SURGERY Right   . MOUTH SURGERY Left 07/14/2017  Family History  Problem Relation Age of Onset  . Diabetes Other   . Hypertension Other   . Diabetes Mother   . Heart failure Mother   . Heart disease Mother   . Heart attack Mother   . Stroke Mother   . Depression Mother   . Hypertension Mother   . Cancer Sister        brain  . Hypertension Sister   . Diabetes Brother   . Hypertension Brother   . Heart failure Sister   . Heart attack Sister   . SIDS Sister     Social History Social History   Tobacco Use  . Smoking status: Former Smoker    Packs/day: 0.25    Years: 20.00    Pack years: 5.00    Types: Cigarettes    Last attempt to quit: 07/01/2006     Years since quitting: 12.1  . Smokeless tobacco: Never Used  Substance Use Topics  . Alcohol use: No    Alcohol/week: 0.0 standard drinks  . Drug use: No    Allergies  Allergen Reactions  . Aspirin Anaphylaxis    Current Outpatient Medications  Medication Sig Dispense Refill  . albuterol (PROVENTIL HFA;VENTOLIN HFA) 108 (90 Base) MCG/ACT inhaler Inhale 2 puffs into the lungs every 6 (six) hours as needed for wheezing or shortness of breath. 1 Inhaler 2  . albuterol (PROVENTIL) (2.5 MG/3ML) 0.083% nebulizer solution Take 2.5 mg every 4 (four) hours as needed by nebulization for wheezing or shortness of breath.    [name] amLODipine (NORVASC) 10 MG tablet Take 1 tablet (10 mg total) by mouth daily. 30 tablet 11  . atorvastatin (LIPITOR) 40 MG tablet Take 1 tablet (40 mg total) by mouth at bedtime. 30 tablet 11  . B-D UF III MINI PEN NEEDLES 31G X 5 MM MISC USE WITH INSULIN PEN INJECTIONS TWICE DAILY    . Calcium Carbonate-Vitamin D3 (CALCIUM 600-D) 600-400 MG-UNIT TABS Take 1 tablet by mouth daily.     . carvedilol (COREG) 3.125 MG tablet Take 1 tablet (3.125 mg total) by mouth 2 (two) times daily with a meal. 60 tablet 11  . cetirizine (ZYRTEC) 10 MG tablet Take 10 mg by mouth daily.    . clopidogrel (PLAVIX) 75 MG tablet Take 1 tablet (75 mg total) by mouth daily. 30 tablet 11  . COMBIVENT RESPIMAT 20-100 MCG/ACT AERS respimat INHALE 1 PUFF BY MOUTH 4 TIMES DAILY AS NEEDED FOR WHEEZING    . esomeprazole (NEXIUM) 40 MG capsule Take 40 mg by mouth daily.    [name] HYDROcodone-acetaminophen (NORCO/VICODIN) 5-325 MG tablet Take 1 tablet by mouth every 6 (six) hours as needed for up to 30 days for moderate pain or severe pain. 90 tablet 0  . [START ON 09/11/2018] HYDROcodone-acetaminophen (NORCO/VICODIN) 5-325 MG tablet Take 1 tablet by mouth every 6 (six) hours as needed for up to 30 days for moderate pain or severe pain. 90 tablet 0  . insulin aspart (NOVOLOG) 100 UNIT/ML injection Inject 10 Units into  the skin 3 (three) times daily.     . insulin glargine (LANTUS) 100 UNIT/ML injection Inject 30-40 Units into the skin See admin instructions. Inject 30 units in the morning at 40 units at bedtime.    . isosorbide mononitrate (IMDUR) 30 MG 24 hr tablet Take 1 tablet (30 mg total) by mouth 2 (two) times daily. 60 tablet 11  . levETIRAcetam (KEPPRA) 750 MG tablet Take 750 mg by mouth 2 (two) times daily.    [name]  losartan (COZAAR) 100 MG tablet Take 1 tablet (100 mg total) by mouth daily. 30 tablet 11  . meloxicam (MOBIC) 7.5 MG tablet Take 7.5 mg by mouth daily.    . montelukast (SINGULAIR) 10 MG tablet Take 10 mg by mouth daily.     . nitroGLYCERIN (NITROSTAT) 0.4 MG SL tablet DISSOLVE ONE TABLET UNDER THE TONGUE EVERY 5 MINUTES AS NEEDED FOR CHEST PAIN.  DO NOT EXCEED A TOTAL OF 3 DOSES IN 15 MINUTES 25 tablet 0  . potassium chloride SA (K-DUR) 20 MEQ tablet Take 2 tablets (40 mEq total) by mouth 2 (two) times daily. Take extra 2 tablets (40 meq) if you take metolazone 130 tablet 11  . sertraline (ZOLOFT) 50 MG tablet Take 50 mg by mouth daily.    [name] tiotropium (SPIRIVA) 18 MCG inhalation capsule Place 18 mcg into inhaler and inhale daily.    [name] torsemide (DEMADEX) 20 MG tablet Take 3 tablets (60 mg total) by mouth 2 (two) times daily for 30 days. 180 tablet 11  . traZODone (DESYREL) 50 MG tablet Take 50 mg by mouth at bedtime.    . metolazone (ZAROXOLYN) 2.5 MG tablet Take 1 tablet (2.5 mg total) by mouth daily as needed for up to 3 days. Take extra potassium as directed 15 tablet 2   No current facility-administered medications for this visit.       REVIEW OF SYSTEMS (Negative unless checked)  Constitutional: [] Weight loss  [] Fever  [] Chills Cardiac: [x] Chest pain   [] Chest pressure   [] Palpitations   [] Shortness of breath when laying flat   [] Shortness of breath at rest   [x] Shortness of breath with exertion. Vascular:  [x] Pain in legs with walking   [] Pain in legs at rest   [] Pain in legs when  laying flat   [] Claudication   [] Pain in feet when walking  [] Pain in feet at rest  [] Pain in feet when laying flat   [] History of DVT   [] Phlebitis   [] Swelling in legs   [] Varicose veins   [] Non-healing ulcers Pulmonary:   [] Uses home oxygen   [] Productive cough   [] Hemoptysis   [] Wheeze  [x] COPD   [] Asthma Neurologic:  [] Dizziness  [] Blackouts   [x] Seizures   [] History of stroke   [] History of TIA  [] Aphasia   [] Temporary blindness   [] Dysphagia   [] Weakness or numbness in arms   [] Weakness or numbness in legs Musculoskeletal:  [x] Arthritis   [] Joint swelling   [x] Joint pain   [] Low back pain Hematologic:  [] Easy bruising  [] Easy bleeding   [] Hypercoagulable state   [] Anemic  [] Hepatitis Gastrointestinal:  [] Blood in stool   [] Vomiting blood  [] Gastroesophageal reflux/heartburn   [] Abdominal pain Genitourinary:  [] Chronic kidney disease   [] Difficult urination  [] Frequent urination  [] Burning with urination   [] Hematuria Skin:  [] Rashes   [] Ulcers   [] Wounds Psychological:  [x] History of anxiety   [x]  History of major depression.    Physical Exam BP 106/67 (BP Location: Right Arm)   Pulse 62   Resp 18   Ht 5\' 11"  (1.803 m)   Wt (!) 302 lb (137 kg)   BMI 42.12 kg/m  Gen:  WD/WN, NAD.  Morbidly obese Head: [place]/AT, No temporalis wasting.  Ear/Nose/Throat: Hearing grossly intact, nares w/o erythema or drainage, oropharynx w/o Erythema/Exudate Eyes: Conjunctiva clear, sclera non-icteric  Neck: trachea midline.  Soft right carotid bruit  Pulmonary:  Good air movement, clear to auscultation bilaterally.  Cardiac: RRR, no JVD Vascular:  Vessel Right  Left  Radial Palpable Palpable                                   Gastrointestinal: soft, non-tender/non-distended.  Musculoskeletal: M/S 5/5 throughout.  Extremities without ischemic changes.  No deformity or atrophy.  Mild to moderate lower extremity edema. Neurologic: Sensation grossly intact in extremities.  Symmetrical.  Speech is  fluent. Motor exam as listed above. Psychiatric: Judgment intact, Mood & affect appropriate for pt's clinical situation. Dermatologic: No rashes or ulcers noted.  No cellulitis or open wounds.    Radiology No results found.  Labs Recent Results (from the past 2160 hour(s))  Basic metabolic panel     Status: Abnormal   Collection Time: 07/01/18 10:30 AM  Result Value Ref Range   Sodium 143 135 - 145 mmol/L   Potassium 3.7 3.5 - 5.1 mmol/L   Chloride 106 98 - 111 mmol/L   CO2 27 22 - 32 mmol/L   Glucose, Bld 123 (H) 70 - 99 mg/dL   BUN 15 8 - 23 mg/dL   Creatinine, Ser 1.02 (H) 0.44 - 1.00 mg/dL   Calcium 9.1 8.9 - 10.3 mg/dL   GFR calc non Af Amer 56 (L) >60 mL/min   GFR calc Af Amer >60 >60 mL/min   Anion gap 10 5 - 15    Comment: Performed at [facility], [address] [place], [place] [address]  Basic metabolic panel     Status: Abnormal   Collection Time: 08/09/18 10:01 AM  Result Value Ref Range   Sodium 142 135 - 145 mmol/L   Potassium 4.0 3.5 - 5.1 mmol/L   Chloride 109 98 - 111 mmol/L   CO2 26 22 - 32 mmol/L   Glucose, Bld 177 (H) 70 - 99 mg/dL   BUN 35 (H) 8 - 23 mg/dL   Creatinine, Ser 1.15 (H) 0.44 - 1.00 mg/dL   Calcium 8.6 (L) 8.9 - 10.3 mg/dL   GFR calc non Af Amer 48 (L) >60 mL/min   GFR calc Af Amer 56 (L) >60 mL/min   Anion gap 7 5 - 15    Comment: Performed at [facility], [address] [place], [place] [address]    Assessment/Plan:  Essential hypertension blood pressure control important in reducing the progression of atherosclerotic disease. On appropriate oral medications.   Diabetes mellitus ([place]) blood glucose control important in reducing the progression of atherosclerotic disease. Also, involved in wound healing. On appropriate medications.   Occlusion and stenosis of vertebral artery The patient has had a recent ultrasound suggesting significant vertebral artery disease.  I do not have the ultrasound for review,  but ultrasound is not particularly reliable for vertebral artery disease given the location and the small size of the vessel.  I think a CT angiogram would be the best course of action for further evaluation to help determine the severity of the disease and potential treatment options.  We discussed that many of these lesions are treated medically and surgery or intervention are actually unusual for vertebral artery disease.  This may or may not be the cause of her posterior headaches as well.  We will see her back following the CT scan to discuss the results and determine further treatment options.      Leotis Pain 08/31/2018, 9:42 AM   This note was created with Dragon medical transcription system.  Any errors from dictation are unintentional.

## 2018-08-31 NOTE — Assessment & Plan Note (Signed)
blood pressure control important in reducing the progression of atherosclerotic disease. On appropriate oral medications.  

## 2018-09-07 ENCOUNTER — Ambulatory Visit: Payer: Medicare HMO | Admitting: Family

## 2018-09-13 ENCOUNTER — Other Ambulatory Visit: Payer: Self-pay | Admitting: Family

## 2018-09-13 MED ORDER — POTASSIUM CHLORIDE CRYS ER 20 MEQ PO TBCR: 40.0000 meq | EXTENDED_RELEASE_TABLET | Freq: Two times a day (BID) | ORAL | 11 refills | Status: DC

## 2018-09-28 ENCOUNTER — Telehealth: Payer: Self-pay

## 2018-09-28 NOTE — Telephone Encounter (Signed)
   TELEPHONE CALL NOTE  This patient has been deemed a candidate for follow-up tele-health visit to limit community exposure during the Covid-19 pandemic. I spoke with the patient via phone to discuss instructions. The patient was advised to review the section on consent for treatment as well. The patient will receive a phone call 2-3 days prior to their E-Visit at which time consent will be verbally confirmed. A Virtual Office Visit appointment type has been scheduled for 09/29/2018 with Darylene Price FNP.  [name], CMA 09/28/2018 1:33 PM

## 2018-09-28 NOTE — Telephone Encounter (Signed)
TELEPHONE CALL NOTE  Tina Hayes has been deemed a candidate for a follow-up tele-health visit to limit community exposure during the Covid-19 pandemic. I spoke with the patient via phone to ensure availability of phone/video source, confirm preferred email & phone number, discuss instructions and expectations, and review consent.   I reminded Tina Hayes to be prepared with any vital sign and/or heart rhythm information that could potentially be obtained via home monitoring, at the time of her visit.  Finally, I reminded Tina Hayes to expect an e-mail containing a link for their video-based visit approximately 15 minutes before her visit, or alternatively, a phone call at the time of her visit if her visit is planned to be a phone encounter.  Did the patient verbally consent to treatment as below? YES  [name], CMA 09/28/2018 1:33 PM  CONSENT FOR TELE-HEALTH VISIT - PLEASE REVIEW  I hereby voluntarily request, consent and authorize [facility] and its employed or contracted physicians, physician assistants, nurse practitioners or other licensed health care professionals (the Practitioner), to provide me with telemedicine health care services (the "Services") as deemed necessary by the treating Practitioner. I acknowledge and consent to receive the Services by the Practitioner via telemedicine. I understand that the telemedicine visit will involve communicating with the Practitioner through telephonic communication technology and the disclosure of certain medical information by electronic transmission. I acknowledge that I have been given the opportunity to request an in-person assessment or other available alternative prior to the telemedicine visit and am voluntarily participating in the telemedicine visit.  I understand that I have the right to withhold or withdraw my consent to the use of telemedicine in the course of my care at any time, without affecting my right  to future care or treatment, and that the Practitioner or I may terminate the telemedicine visit at any time. I understand that I have the right to inspect all information obtained and/or recorded in the course of the telemedicine visit and may receive copies of available information for a reasonable fee.  I understand that some of the potential risks of receiving the Services via telemedicine include:  [name] Delay or interruption in medical evaluation due to technological equipment failure or disruption; . Information transmitted may not be sufficient (e.g. poor resolution of images) to allow for appropriate medical decision making by the Practitioner; and/or  . In rare instances, security protocols could fail, causing a breach of personal health information.  Furthermore, I acknowledge that it is my responsibility to provide information about my medical history, conditions and care that is complete and accurate to the best of my ability. I acknowledge that Practitioner's advice, recommendations, and/or decision may be based on factors not within their control, such as incomplete or inaccurate data provided by me or lack of visual representation. I understand that the practice of medicine is not an exact science and that Practitioner makes no warranties or guarantees regarding treatment outcomes. I acknowledge that I will receive a copy of this consent concurrently upon execution via email to the email address I last provided but may also request a printed copy by calling the office of [facility].    I understand that my insurance may be billed for this visit.   I have read or had this consent read to me. . I understand the contents of this consent, which adequately explains the benefits and risks of the Services being provided via telemedicine.  [name]  I have been provided ample opportunity to ask questions regarding this consent and the Services and have had my questions answered to my  satisfaction. . I give my informed consent for the services to be provided through the use of telemedicine in my medical care  By participating in this telemedicine visit I agree to the above.

## 2018-09-29 ENCOUNTER — Ambulatory Visit: Payer: Medicare HMO | Attending: Family | Admitting: Family

## 2018-09-29 ENCOUNTER — Encounter: Payer: Self-pay | Admitting: Family

## 2018-09-29 ENCOUNTER — Other Ambulatory Visit: Payer: Self-pay

## 2018-09-29 ENCOUNTER — Telehealth: Payer: Self-pay

## 2018-09-29 VITALS — Wt 298.0 lb

## 2018-09-29 DIAGNOSIS — I5032 Chronic diastolic (congestive) heart failure: Secondary | ICD-10-CM

## 2018-09-29 DIAGNOSIS — E119 Type 2 diabetes mellitus without complications: Secondary | ICD-10-CM

## 2018-09-29 NOTE — Progress Notes (Signed)
Virtual Visit via Telephone Note    Evaluation Performed:  Follow-up visit  This visit type was conducted due to national recommendations for restrictions regarding the COVID-19 Pandemic (e.g. social distancing).  This format is felt to be most appropriate for this patient at this time.  All issues noted in this document were discussed and addressed.  No physical exam was performed (except for noted visual exam findings with Video Visits).  Please refer to the patient's chart (MyChart message for video visits and phone note for telephone visits) for the patient's consent to telehealth for [facility]  Date:  09/29/2018   ID:  Tina Hayes, DOB Jun 02, 1945, MRN [id number]  Patient Location:  [address]  [place] [address]   Provider location:   [facility] [place] [address], [place] [address]  PCP:  Tina Commons, MD  Cardiologist:  Tina Rogue, MD Electrophysiologist:  None   Chief Complaint:  Fatigue  History of Present Illness:    Tina Hayes is a 73 y.o. female who presents via audio/video conferencing for a telehealth visit today.  Patient verified DOB and address.  The patient does not have symptoms concerning for COVID-19 infection (fever, chills, cough, or new SHORTNESS OF BREATH).   Patient reports minimal fatigue upon moderate exertion. She describes it as chronic in nature having been present for several years. She has associated swelling in her legs, chronic back pain, shortness of breath and chronic difficulty sleeping along with this. She denies any palpitations, chest pain or weight gain. Says that her glucose levels have been "good".   Prior CV studies:   The following studies were reviewed today:  Echo report from 06/17/2016 reviewed and showed an EF of 60-65% along with mild MR and moderate TR.   Past Medical History:  Diagnosis Date  . (HFpEF) heart failure with preserved ejection fraction ([place])    a.  05/2016 Echo: EF 60-65%, mild to mod LVH, Gr1 DD, mild MR, mildly dil LA, mod TR, mildly to mod increased PASP.  [name] Acute diastolic heart failure ([name]) 01/27/2017  . Anxiety   . Chest pain 06/16/2016  . CHF (congestive heart failure) ([place])   . Chronic back pain   . Chronic diastolic congestive heart failure ([place]) 02/13/2017  . COPD (chronic obstructive pulmonary disease) ([place])   . Coronary artery disease    a. s/p remote PCI x 5;  b. 2006 s/p CABG x 3 ([place], [place]); b. 05/2016 MV: attenuation corrected images w/o ischemia or wma-->Med rx.  . Coronary artery disease of native artery of native heart with stable angina pectoris ([place]) 06/17/2016  . Depression   . Diabetes mellitus without complication ([place])   . Essential hypertension 06/30/2016  . Heart attack ([place])    Total of 3 per pt.  . Hypertension   . Hypertensive urgency 06/03/2015  . Seizure ([place])   . Seizures ([place])    Past Surgical History:  Procedure Laterality Date  . ABDOMINAL HYSTERECTOMY    . ANKLE SURGERY Right   . CATARACT EXTRACTION W/ INTRAOCULAR LENS  IMPLANT, BILATERAL    . COLONOSCOPY WITH PROPOFOL N/A 05/04/2017   Procedure: COLONOSCOPY WITH PROPOFOL;  Surgeon: Tina Silvas, MD;  Location: [facility] ENDOSCOPY;  Service: Endoscopy;  Laterality: N/A;  . CORONARY ANGIOPLASTY    . CORONARY ARTERY BYPASS GRAFT     TRIPLE BYPASS  . ESOPHAGOGASTRODUODENOSCOPY (EGD) WITH PROPOFOL N/A 05/04/2017  Procedure: ESOPHAGOGASTRODUODENOSCOPY (EGD) WITH PROPOFOL;  Surgeon: Tina Silvas, MD;  Location: [facility] ENDOSCOPY;  Service: Endoscopy;  Laterality: N/A;  . EXCISION MASS LOWER EXTREMETIES Right 03/02/2018   Procedure: EXCISION SOFT TISSUE MASS FROM MEDIAL ASPECT OF RIGHT ANKLE;  Surgeon: Tina Mull, MD;  Location: [facility] ORS;  Service: Orthopedics;  Laterality: Right;  . FRACTURE SURGERY    . KNEE SURGERY Right   . MOUTH SURGERY Left 07/14/2017     Current Meds  Medication Sig  . albuterol (PROVENTIL  HFA;VENTOLIN HFA) 108 (90 Base) MCG/ACT inhaler Inhale 2 puffs into the lungs every 6 (six) hours as needed for wheezing or shortness of breath.  [name] albuterol (PROVENTIL) (2.5 MG/3ML) 0.083% nebulizer solution Take 2.5 mg every 4 (four) hours as needed by nebulization for wheezing or shortness of breath.  [name] amLODipine (NORVASC) 10 MG tablet Take 1 tablet (10 mg total) by mouth daily.  [name] atorvastatin (LIPITOR) 40 MG tablet Take 1 tablet (40 mg total) by mouth at bedtime.  . B-D UF III MINI PEN NEEDLES 31G X 5 MM MISC USE WITH INSULIN PEN INJECTIONS TWICE DAILY  . Calcium Carbonate-Vitamin D3 (CALCIUM 600-D) 600-400 MG-UNIT TABS Take 1 tablet by mouth daily.   . carvedilol (COREG) 3.125 MG tablet Take 1 tablet (3.125 mg total) by mouth 2 (two) times daily with a meal.  . cetirizine (ZYRTEC) 10 MG tablet Take 10 mg by mouth daily.  . clopidogrel (PLAVIX) 75 MG tablet Take 1 tablet (75 mg total) by mouth daily.  . famotidine (PEPCID) 20 MG tablet Take 20 mg by mouth at bedtime.  [name] HYDROcodone-acetaminophen (NORCO/VICODIN) 5-325 MG tablet Take 1 tablet by mouth every 6 (six) hours as needed for up to 30 days for moderate pain or severe pain.  [name] insulin aspart (NOVOLOG) 100 UNIT/ML injection Inject 10 Units into the skin 3 (three) times daily.   . insulin glargine (LANTUS) 100 UNIT/ML injection Inject 30-40 Units into the skin See admin instructions. Inject 30 units in the morning at 40 units at bedtime.  . isosorbide mononitrate (IMDUR) 30 MG 24 hr tablet Take 1 tablet (30 mg total) by mouth 2 (two) times daily.  [name] levETIRAcetam (KEPPRA) 750 MG tablet Take 750 mg by mouth 2 (two) times daily.  [name] losartan (COZAAR) 100 MG tablet Take 1 tablet (100 mg total) by mouth daily.  . meloxicam (MOBIC) 7.5 MG tablet Take 7.5 mg by mouth daily.  . montelukast (SINGULAIR) 10 MG tablet Take 10 mg by mouth daily.   . nitroGLYCERIN (NITROSTAT) 0.4 MG SL tablet DISSOLVE ONE TABLET UNDER THE TONGUE EVERY 5 MINUTES AS  NEEDED FOR CHEST PAIN.  DO NOT EXCEED A TOTAL OF 3 DOSES IN 15 MINUTES  . potassium chloride SA (K-DUR) 20 MEQ tablet Take 2 tablets (40 mEq total) by mouth 2 (two) times daily. Take extra 2 tablets (40 meq) if you take metolazone  . sertraline (ZOLOFT) 50 MG tablet Take 50 mg by mouth daily.  [name] tiotropium (SPIRIVA) 18 MCG inhalation capsule Place 18 mcg into inhaler and inhale daily.  [name] torsemide (DEMADEX) 20 MG tablet Take 3 tablets (60 mg total) by mouth 2 (two) times daily for 30 days.  . traZODone (DESYREL) 50 MG tablet Take 50 mg by mouth at bedtime.     Allergies:   Aspirin   Social History   Tobacco Use  . Smoking status: Former Smoker    Packs/day: 0.25    Years: 20.00    Pack  years: 5.00    Types: Cigarettes    Last attempt to quit: 07/01/2006    Years since quitting: 12.2  . Smokeless tobacco: Never Used  Substance Use Topics  . Alcohol use: No    Alcohol/week: 0.0 standard drinks  . Drug use: No     Family Hx: The patient's family history includes Cancer in her sister; Depression in her mother; Diabetes in her brother, mother, and another family member; Heart attack in her mother and sister; Heart disease in her mother; Heart failure in her mother and sister; Hypertension in her brother, mother, sister, and another family member; SIDS in her sister; Stroke in her mother.  ROS:   Please see the history of present illness.     All other systems reviewed and are negative.   Labs/Other Tests and Data Reviewed:    Recent Labs: 05/26/2018: ALT 28 05/28/2018: Hemoglobin 9.9; Magnesium 2.1; Platelets 238 08/09/2018: BUN 35; Creatinine, Ser 1.15; Potassium 4.0; Sodium 142   Recent Lipid Panel Lab Results  Component Value Date/Time   CHOL 120 01/30/2017 04:04 AM   TRIG 73 01/30/2017 04:04 AM   HDL 47 01/30/2017 04:04 AM   CHOLHDL 2.6 01/30/2017 04:04 AM   LDLCALC 58 01/30/2017 04:04 AM    Wt Readings from Last 3 Encounters:  09/29/18 298 lb (135.2 kg)  08/31/18 (!)  302 lb (137 kg)  08/17/18 297 lb (134.7 kg)     Exam:    Vital Signs:  Wt 298 lb (135.2 kg) Comment: self-reported  BMI 41.56 kg/m    Well nourished, well developed female in no  acute distress.   ASSESSMENT & PLAN:    1. Chronic heart failure with preserved ejection fraction- - NYHA class II - euvolemic per patient's description - weighing daily and says that her weight has been stable. Reminded to call for an overnight weight gain of >2 pounds or a weekly weight gain of >5 pounds - not adding salt and is trying to eat low sodium foods - had telemedicine visit with cardiology Rockey Situ) 08/17/2018 - says that she hasn't been as active because her chronic back has worsened as she hasn't been to get her epidurals due to COVID restrictions.   2: Diabetes- - she says that her glucose has been "good" but doesn't have exact reading today - BMP 08/09/2018 reviewed and showed sodium 142, potassium 4.0, creatinine 1.15 and GFR 56 - A1c 05/27/2018 was 7.5%    COVID-19 Education: The signs and symptoms of COVID-19 were discussed with the patient and how to seek care for testing (follow up with PCP or arrange E-visit).  The importance of social distancing was discussed today.  Patient Risk:   After full review of this patients clinical status, I feel that they are at least moderate risk at this time.  Time:   Today, I have spent 12 minutes with the patient with telehealth technology discussing medications, diet and symptoms to report.     Medication Adjustments/Labs and Tests Ordered: Current medicines are reviewed at length with the patient today.  Concerns regarding medicines are outlined above.   Tests Ordered: No orders of the defined types were placed in this encounter.  Medication Changes: No orders of the defined types were placed in this encounter.   Disposition:  Follow-up in 4 months or sooner for any questions/problems before then.  Signed, Alisa Graff, FNP   09/29/2018 9:28 AM    [facility]

## 2018-09-29 NOTE — Patient Instructions (Signed)
Continue weighing daily and call for an overnight weight gain of > 2 pounds or a weekly weight gain of >5 pounds. 

## 2018-09-30 NOTE — Telephone Encounter (Signed)
Patient in a lot of pain and wants to know when she can have a procedure. I told her we would call her when Dr. Andree Elk gave [place] the ok to schedule procedures.

## 2018-10-04 ENCOUNTER — Encounter: Payer: Self-pay | Admitting: Anesthesiology

## 2018-10-04 ENCOUNTER — Other Ambulatory Visit: Payer: Self-pay

## 2018-10-04 ENCOUNTER — Ambulatory Visit: Payer: Medicare HMO | Attending: Anesthesiology | Admitting: Anesthesiology

## 2018-10-04 DIAGNOSIS — M5431 Sciatica, right side: Secondary | ICD-10-CM

## 2018-10-04 DIAGNOSIS — M47817 Spondylosis without myelopathy or radiculopathy, lumbosacral region: Secondary | ICD-10-CM

## 2018-10-04 DIAGNOSIS — M5136 Other intervertebral disc degeneration, lumbar region: Secondary | ICD-10-CM | POA: Diagnosis not present

## 2018-10-04 DIAGNOSIS — M25512 Pain in left shoulder: Secondary | ICD-10-CM

## 2018-10-04 DIAGNOSIS — G894 Chronic pain syndrome: Secondary | ICD-10-CM

## 2018-10-04 DIAGNOSIS — F1190 Opioid use, unspecified, uncomplicated: Secondary | ICD-10-CM

## 2018-10-04 DIAGNOSIS — M170 Bilateral primary osteoarthritis of knee: Secondary | ICD-10-CM

## 2018-10-04 DIAGNOSIS — M4716 Other spondylosis with myelopathy, lumbar region: Secondary | ICD-10-CM | POA: Diagnosis not present

## 2018-10-04 DIAGNOSIS — M48062 Spinal stenosis, lumbar region with neurogenic claudication: Secondary | ICD-10-CM | POA: Diagnosis not present

## 2018-10-04 DIAGNOSIS — M5432 Sciatica, left side: Secondary | ICD-10-CM

## 2018-10-04 MED ORDER — HYDROCODONE-ACETAMINOPHEN 7.5-325 MG PO TABS: 1.0000 | ORAL_TABLET | Freq: Three times a day (TID) | ORAL | 0 refills | Status: DC

## 2018-10-04 MED ORDER — HYDROCODONE-ACETAMINOPHEN 7.5-325 MG PO TABS: 1.0000 | ORAL_TABLET | Freq: Three times a day (TID) | ORAL | 0 refills | Status: AC

## 2018-10-04 NOTE — Progress Notes (Signed)
Virtual Visit via Telephone Note  I connected with Tina Hayes on 10/04/18 at  3:30 PM EDT by telephone and verified that I am speaking with the correct person using two identifiers.  Location: Patient: Home Provider: Pain control center   I discussed the limitations, risks, security and privacy concerns of performing an evaluation and management service by telephone and the availability of in person appointments. I also discussed with the patient that there may be a patient responsible charge related to this service. The patient expressed understanding and agreed to proceed.   History of Present Illness: I spoke with Tina Hayes via telephone today.  She is unable to do video voice virtual conversation.  She has been in a lot more pain recently as it has been several months since her last epidural.  The pain is mainly in the low back running down the posterior legs into the calves.  No change in lower extremity strength or function or bowel bladder function is noted.  She is also having bilateral shoulder pain and Vicodin 5 mg tablets that she takes 3 times a day is less effective.  No other changes are noted at this time.  She has been trying to do her stretching exercises but these also have been ineffective as has been previous physical therapy.  When she does take her medication even to 5 mg strength she does get some relief but it is short-lived and inadequate for where she is now    Observations/Objective: Not in reference to her pain sit 30. Current Outpatient Medications:  .  albuterol (PROVENTIL HFA;VENTOLIN HFA) 108 (90 Base) MCG/ACT inhaler, Inhale 2 puffs into the lungs every 6 (six) hours as needed for wheezing or shortness of breath., Disp: 1 Inhaler, Rfl: 2 .  albuterol (PROVENTIL) (2.5 MG/3ML) 0.083% nebulizer solution, Take 2.5 mg every 4 (four) hours as needed by nebulization for wheezing or shortness of breath., Disp: , Rfl:  .  amLODipine (NORVASC) 10 MG tablet, Take 1 tablet  (10 mg total) by mouth daily., Disp: 30 tablet, Rfl: 11 .  atorvastatin (LIPITOR) 40 MG tablet, Take 1 tablet (40 mg total) by mouth at bedtime., Disp: 30 tablet, Rfl: 11 .  B-D UF III MINI PEN NEEDLES 31G X 5 MM MISC, USE WITH INSULIN PEN INJECTIONS TWICE DAILY, Disp: , Rfl:  .  Calcium Carbonate-Vitamin D3 (CALCIUM 600-D) 600-400 MG-UNIT TABS, Take 1 tablet by mouth daily. , Disp: , Rfl:  .  carvedilol (COREG) 3.125 MG tablet, Take 1 tablet (3.125 mg total) by mouth 2 (two) times daily with a meal., Disp: 60 tablet, Rfl: 11 .  cetirizine (ZYRTEC) 10 MG tablet, Take 10 mg by mouth daily., Disp: , Rfl:  .  clopidogrel (PLAVIX) 75 MG tablet, Take 1 tablet (75 mg total) by mouth daily., Disp: 30 tablet, Rfl: 11 .  COMBIVENT RESPIMAT 20-100 MCG/ACT AERS respimat, INHALE 1 PUFF BY MOUTH 4 TIMES DAILY AS NEEDED FOR WHEEZING, Disp: , Rfl:  .  famotidine (PEPCID) 20 MG tablet, Take 20 mg by mouth at bedtime., Disp: , Rfl:  .  [START ON 10/08/2018] HYDROcodone-acetaminophen (NORCO) 7.5-325 MG tablet, Take 1 tablet by mouth 3 (three) times daily for 30 days., Disp: 90 tablet, Rfl: 0 .  [START ON 11/07/2018] HYDROcodone-acetaminophen (NORCO) 7.5-325 MG tablet, Take 1 tablet by mouth 3 (three) times daily for 30 days., Disp: 90 tablet, Rfl: 0 .  HYDROcodone-acetaminophen (NORCO/VICODIN) 5-325 MG tablet, Take 1 tablet by mouth every 6 (six) hours as needed  for up to 30 days for moderate pain or severe pain., Disp: 90 tablet, Rfl: 0 .  insulin aspart (NOVOLOG) 100 UNIT/ML injection, Inject 10 Units into the skin 3 (three) times daily. , Disp: , Rfl:  .  insulin glargine (LANTUS) 100 UNIT/ML injection, Inject 30-40 Units into the skin See admin instructions. Inject 30 units in the morning at 40 units at bedtime., Disp: , Rfl:  .  isosorbide mononitrate (IMDUR) 30 MG 24 hr tablet, Take 1 tablet (30 mg total) by mouth 2 (two) times daily., Disp: 60 tablet, Rfl: 11 .  levETIRAcetam (KEPPRA) 750 MG tablet, Take 750 mg by  mouth 2 (two) times daily., Disp: , Rfl:  .  losartan (COZAAR) 100 MG tablet, Take 1 tablet (100 mg total) by mouth daily., Disp: 30 tablet, Rfl: 11 .  meloxicam (MOBIC) 7.5 MG tablet, Take 7.5 mg by mouth daily., Disp: , Rfl:  .  montelukast (SINGULAIR) 10 MG tablet, Take 10 mg by mouth daily. , Disp: , Rfl:  .  nitroGLYCERIN (NITROSTAT) 0.4 MG SL tablet, DISSOLVE ONE TABLET UNDER THE TONGUE EVERY 5 MINUTES AS NEEDED FOR CHEST PAIN.  DO NOT EXCEED A TOTAL OF 3 DOSES IN 15 MINUTES, Disp: 25 tablet, Rfl: 0 .  potassium chloride SA (K-DUR) 20 MEQ tablet, Take 2 tablets (40 mEq total) by mouth 2 (two) times daily. Take extra 2 tablets (40 meq) if you take metolazone, Disp: 130 tablet, Rfl: 11 .  sertraline (ZOLOFT) 50 MG tablet, Take 50 mg by mouth daily., Disp: , Rfl:  .  tiotropium (SPIRIVA) 18 MCG inhalation capsule, Place 18 mcg into inhaler and inhale daily., Disp: , Rfl:  .  torsemide (DEMADEX) 20 MG tablet, Take 3 tablets (60 mg total) by mouth 2 (two) times daily for 30 days., Disp: 180 tablet, Rfl: 11 .  traZODone (DESYREL) 50 MG tablet, Take 50 mg by mouth at bedtime., Disp: , Rfl:    Assessment and Plan: 1. Bilateral sciatica   2. DDD (degenerative disc disease), lumbar   3. Spinal stenosis of lumbar region with neurogenic claudication   4. Lumbar spondylosis with myelopathy   5. Facet arthritis of lumbosacral region   6. Primary osteoarthritis of both knees   7. Chronic, continuous use of opioids   8. Pain in joint of left shoulder   9. Chronic pain syndrome   Based on our discussion today and upon review of the [facility] practitioner database information I am going to increase her Vicodin to the 7.5 mg tablet.  I want to keep her at 90 tablets this will be a ceiling dose as we discussed.  We have gone over the risk and benefits of chronic opioid management previously and once again today.  I am scheduling her for return to clinic in 1 to 2 months for an epidural in the pain  center and I want her to continue with her stretching strengthening exercises.  She is to continue follow-up with her primary care physicians for her baseline medical care.  Follow Up Instructions:    I discussed the assessment and treatment plan with the patient. The patient was provided an opportunity to ask questions and all were answered. The patient agreed with the plan and demonstrated an understanding of the instructions.   The patient was advised to call back or seek an in-person evaluation if the symptoms worsen or if the condition fails to improve as anticipated.  I provided 30 minutes of non-face-to-face time during this encounter.  Molli Barrows, MD

## 2018-11-09 ENCOUNTER — Telehealth: Payer: Self-pay | Admitting: Cardiovascular Disease

## 2018-11-09 NOTE — Telephone Encounter (Signed)
Pt c/o medication issue:  1. Name of Medication: plavix   2. How are you currently taking this medication (dosage and times per day)? 75 mg po q d   3. Are you having a reaction (difficulty breathing--STAT)? no  4. What is your medication issue? Patient wants to know if she is still supposed to be taking this

## 2018-11-10 NOTE — Telephone Encounter (Signed)
Stay on plavix for now Older stents Ok to come off 5 days for procedures if needed

## 2018-11-12 ENCOUNTER — Other Ambulatory Visit: Payer: Medicare HMO

## 2018-11-12 NOTE — Telephone Encounter (Signed)
I attempted to call the patient. I left a message of Dr. Donivan Scull recommendations on her identified voice mail (ok per DPR).  I asked that she call back with any questions or concerns.

## 2018-11-16 ENCOUNTER — Ambulatory Visit
Admission: RE | Admit: 2018-11-16 | Discharge: 2018-11-16 | Disposition: A | Discharge status: Home / Self Care | Payer: Medicare HMO | Source: Ambulatory Visit | Attending: Anesthesiology | Admitting: Anesthesiology

## 2018-11-16 ENCOUNTER — Encounter: Payer: Self-pay | Admitting: Anesthesiology

## 2018-11-16 ENCOUNTER — Other Ambulatory Visit: Payer: Self-pay

## 2018-11-16 ENCOUNTER — Ambulatory Visit (HOSPITAL_BASED_OUTPATIENT_CLINIC_OR_DEPARTMENT_OTHER): Payer: Medicare HMO | Admitting: Anesthesiology

## 2018-11-16 ENCOUNTER — Other Ambulatory Visit: Payer: Self-pay | Admitting: Anesthesiology

## 2018-11-16 VITALS — BP 155/70 | HR 53 | Temp 98.0°F | Resp 18 | Ht 71.0 in | Wt 300.0 lb

## 2018-11-16 DIAGNOSIS — F1190 Opioid use, unspecified, uncomplicated: Secondary | ICD-10-CM | POA: Insufficient documentation

## 2018-11-16 DIAGNOSIS — M5136 Other intervertebral disc degeneration, lumbar region: Secondary | ICD-10-CM

## 2018-11-16 DIAGNOSIS — M47817 Spondylosis without myelopathy or radiculopathy, lumbosacral region: Secondary | ICD-10-CM

## 2018-11-16 DIAGNOSIS — M4716 Other spondylosis with myelopathy, lumbar region: Secondary | ICD-10-CM

## 2018-11-16 DIAGNOSIS — M5431 Sciatica, right side: Secondary | ICD-10-CM | POA: Diagnosis present

## 2018-11-16 DIAGNOSIS — G894 Chronic pain syndrome: Secondary | ICD-10-CM

## 2018-11-16 DIAGNOSIS — M48062 Spinal stenosis, lumbar region with neurogenic claudication: Secondary | ICD-10-CM

## 2018-11-16 DIAGNOSIS — M5432 Sciatica, left side: Secondary | ICD-10-CM | POA: Insufficient documentation

## 2018-11-16 DIAGNOSIS — R52 Pain, unspecified: Secondary | ICD-10-CM | POA: Diagnosis present

## 2018-11-16 MED ORDER — LIDOCAINE HCL (PF) 1 % IJ SOLN
INTRAMUSCULAR | Status: AC
  Filled 2018-11-16: qty 5

## 2018-11-16 MED ORDER — TRIAMCINOLONE ACETONIDE 40 MG/ML IJ SUSP
INTRAMUSCULAR | Status: AC
  Filled 2018-11-16: qty 1

## 2018-11-16 MED ORDER — MIDAZOLAM HCL 5 MG/5ML IJ SOLN
INTRAMUSCULAR | Status: AC
  Filled 2018-11-16: qty 5

## 2018-11-16 MED ORDER — MIDAZOLAM HCL 5 MG/5ML IJ SOLN
5.0000 mg | Freq: Once | INTRAMUSCULAR | Status: AC
  Administered 2018-11-16: 4 mg via INTRAVENOUS

## 2018-11-16 MED ORDER — IOPAMIDOL (ISOVUE-M 200) INJECTION 41%: 20.0000 mL | Freq: Once | INTRAMUSCULAR | Status: DC | PRN

## 2018-11-16 MED ORDER — ROPIVACAINE HCL 2 MG/ML IJ SOLN
INTRAMUSCULAR | Status: AC
  Filled 2018-11-16: qty 10

## 2018-11-16 MED ORDER — SODIUM CHLORIDE (PF) 0.9 % IJ SOLN
INTRAMUSCULAR | Status: AC
  Filled 2018-11-16: qty 10

## 2018-11-16 MED ORDER — SODIUM CHLORIDE 0.9% FLUSH
10.0000 mL | Freq: Once | INTRAVENOUS | Status: AC
  Administered 2018-11-16: 10 mL

## 2018-11-16 MED ORDER — HYDROCODONE-ACETAMINOPHEN 7.5-325 MG PO TABS: 1.0000 | ORAL_TABLET | Freq: Three times a day (TID) | ORAL | 0 refills | Status: DC

## 2018-11-16 MED ORDER — TRIAMCINOLONE ACETONIDE 40 MG/ML IJ SUSP
40.0000 mg | Freq: Once | INTRAMUSCULAR | Status: AC
  Administered 2018-11-16: 40 mg

## 2018-11-16 MED ORDER — ROPIVACAINE HCL 2 MG/ML IJ SOLN
10.0000 mL | Freq: Once | INTRAMUSCULAR | Status: AC
  Administered 2018-11-16: 1 mL via EPIDURAL

## 2018-11-16 MED ORDER — LACTATED RINGERS IV SOLN
1000.0000 mL | INTRAVENOUS | Status: DC
  Administered 2018-11-16: 15:00:00 1000 mL via INTRAVENOUS

## 2018-11-16 MED ORDER — LIDOCAINE HCL (PF) 1 % IJ SOLN
5.0000 mL | Freq: Once | INTRAMUSCULAR | Status: AC
  Administered 2018-11-16: 5 mL via SUBCUTANEOUS

## 2018-11-16 NOTE — Patient Instructions (Signed)

## 2018-11-16 NOTE — Progress Notes (Signed)
Safety precautions to be maintained throughout the outpatient stay will include: orient to surroundings, keep bed in low position, maintain call bell within reach at all times, provide assistance with transfer out of bed and ambulation.  

## 2018-11-18 LAB — TOXASSURE SELECT 13 (MW), URINE

## 2018-11-18 NOTE — Progress Notes (Signed)
Subjective:  Patient ID: Tina Hayes, female    DOB: [birth year]  Age: 73 y.o. MRN: [id number]  CC: Back Pain   Procedure: L5-S1 epidural steroid under fluoroscopic guidance with no sedation  HPI [name] presents for reevaluation.  She was last seen several months ago and has been having more pain in the low back with radiation into the bilateral calves.  She has had this in the past and has responded favorably to epidural steroid injections.  No change in lower extremity strength or function or bowel bladder function are noted.  She is taking her medications as prescribed and despite these she is having persistent pain that is keeping her up at night and limiting her mobility.  The medication she utilizes are effective she reports and yielding no side effects.  She does get relief with these but continues to have breakthrough pain.  Outpatient Medications Prior to Visit  Medication Sig Dispense Refill  . albuterol (PROVENTIL HFA;VENTOLIN HFA) 108 (90 Base) MCG/ACT inhaler Inhale 2 puffs into the lungs every 6 (six) hours as needed for wheezing or shortness of breath. 1 Inhaler 2  . albuterol (PROVENTIL) (2.5 MG/3ML) 0.083% nebulizer solution Take 2.5 mg every 4 (four) hours as needed by nebulization for wheezing or shortness of breath.    [name] amLODipine (NORVASC) 10 MG tablet Take 1 tablet (10 mg total) by mouth daily. 30 tablet 11  . atorvastatin (LIPITOR) 40 MG tablet Take 1 tablet (40 mg total) by mouth at bedtime. 30 tablet 11  . B-D UF III MINI PEN NEEDLES 31G X 5 MM MISC USE WITH INSULIN PEN INJECTIONS TWICE DAILY    . Calcium Carbonate-Vitamin D3 (CALCIUM 600-D) 600-400 MG-UNIT TABS Take 1 tablet by mouth daily.     . carvedilol (COREG) 3.125 MG tablet Take 1 tablet (3.125 mg total) by mouth 2 (two) times daily with a meal. 60 tablet 11  . cetirizine (ZYRTEC) 10 MG tablet Take 10 mg by mouth daily.    . clopidogrel (PLAVIX) 75 MG tablet Take 1 tablet (75 mg total) by mouth daily.  30 tablet 11  . COMBIVENT RESPIMAT 20-100 MCG/ACT AERS respimat INHALE 1 PUFF BY MOUTH 4 TIMES DAILY AS NEEDED FOR WHEEZING    . famotidine (PEPCID) 20 MG tablet Take 20 mg by mouth at bedtime.    . insulin aspart (NOVOLOG) 100 UNIT/ML injection Inject 10 Units into the skin 3 (three) times daily.     . insulin glargine (LANTUS) 100 UNIT/ML injection Inject 30-40 Units into the skin See admin instructions. Inject 30 units in the morning at 40 units at bedtime.    . isosorbide mononitrate (IMDUR) 30 MG 24 hr tablet Take 1 tablet (30 mg total) by mouth 2 (two) times daily. 60 tablet 11  . levETIRAcetam (KEPPRA) 750 MG tablet Take 750 mg by mouth 2 (two) times daily.    [name] losartan (COZAAR) 100 MG tablet Take 1 tablet (100 mg total) by mouth daily. 30 tablet 11  . meloxicam (MOBIC) 7.5 MG tablet Take 7.5 mg by mouth daily.    . montelukast (SINGULAIR) 10 MG tablet Take 10 mg by mouth daily.     . nitroGLYCERIN (NITROSTAT) 0.4 MG SL tablet DISSOLVE ONE TABLET UNDER THE TONGUE EVERY 5 MINUTES AS NEEDED FOR CHEST PAIN.  DO NOT EXCEED A TOTAL OF 3 DOSES IN 15 MINUTES 25 tablet 0  . potassium chloride SA (K-DUR) 20 MEQ tablet Take 2 tablets (40 mEq total) by mouth  2 (two) times daily. Take extra 2 tablets (40 meq) if you take metolazone 130 tablet 11  . sertraline (ZOLOFT) 50 MG tablet Take 50 mg by mouth daily.    [name] tiotropium (SPIRIVA) 18 MCG inhalation capsule Place 18 mcg into inhaler and inhale daily.    . traZODone (DESYREL) 50 MG tablet Take 50 mg by mouth at bedtime.    [name] HYDROcodone-acetaminophen (NORCO) 7.5-325 MG tablet Take 1 tablet by mouth 3 (three) times daily for 30 days. 90 tablet 0  . torsemide (DEMADEX) 20 MG tablet Take 3 tablets (60 mg total) by mouth 2 (two) times daily for 30 days. 180 tablet 11   No facility-administered medications prior to visit.     Review of Systems CNS: No confusion or sedation Cardiac: No angina or palpitations GI: No abdominal pain or  constipation Constitutional: No nausea vomiting fevers or chills  Objective:  BP (!) 155/70   Pulse (!) 53   Temp 98 F (36.7 C)   Resp 18   Ht 5\' 11"  (1.803 m)   Wt 300 lb (136.1 kg)   SpO2 99%   BMI 41.84 kg/m    BP Readings from Last 3 Encounters:  11/16/18 (!) 155/70  08/31/18 106/67  07/14/18 (!) 162/80     Wt Readings from Last 3 Encounters:  11/16/18 300 lb (136.1 kg)  09/29/18 298 lb (135.2 kg)  08/31/18 (!) 302 lb (137 kg)     Physical Exam Pt is alert and oriented PERRL EOMI HEART IS RRR no murmur or rub LCTA no wheezing or rales MUSCULOSKELETAL reveals some paraspinous muscle tenderness but no overt trigger points.  She does have an antalgic gait and positive straight leg raise bilaterally.  Labs  Lab Results  Component Value Date   HGBA1C 7.5 (H) 05/27/2018   HGBA1C 7.7 (H) 08/16/2017   HGBA1C 8.5 (H) 01/29/2017   Lab Results  Component Value Date   LDLCALC 58 01/30/2017   CREATININE 1.15 (H) 08/09/2018    -------------------------------------------------------------------------------------------------------------------- Lab Results  Component Value Date   WBC 7.2 05/28/2018   HGB 9.9 (L) 05/28/2018   HCT 31.0 (L) 05/28/2018   PLT 238 05/28/2018   GLUCOSE 177 (H) 08/09/2018   CHOL 120 01/30/2017   TRIG 73 01/30/2017   HDL 47 01/30/2017   LDLCALC 58 01/30/2017   ALT 28 05/26/2018   AST 27 05/26/2018   NA 142 08/09/2018   K 4.0 08/09/2018   CL 109 08/09/2018   CREATININE 1.15 (H) 08/09/2018   BUN 35 (H) 08/09/2018   CO2 26 08/09/2018   INR 0.98 05/25/2018   HGBA1C 7.5 (H) 05/27/2018    --------------------------------------------------------------------------------------------------------------------- [facility] C-arm 1-60 Min No Report  Result Date: 11/16/2018 Fluoro was used, but no Radiologist interpretation will be provided. Please refer to "NOTES" tab for provider progress note.    Assessment & Plan:   Mayrani was  seen today for back pain.  Diagnoses and all orders for this visit:  Bilateral sciatica  DDD (degenerative disc disease), lumbar  Spinal stenosis of lumbar region with neurogenic claudication  Lumbar spondylosis with myelopathy  Chronic, continuous use of opioids -     ToxASSURE Select 13 (MW), Urine  Chronic pain syndrome -     ToxASSURE Select 13 (MW), Urine  Facet arthritis of lumbosacral region  Other orders -     triamcinolone acetonide (KENALOG-40) injection 40 mg -     sodium chloride flush (NS) 0.9 % injection 10 mL -  ropivacaine (PF) 2 mg/mL (0.2%) (NAROPIN) injection 10 mL -     midazolam (VERSED) 5 MG/5ML injection 5 mg -     lidocaine (PF) (XYLOCAINE) 1 % injection 5 mL -     lactated ringers infusion 1,000 mL -     iopamidol (ISOVUE-M) 41 % intrathecal injection 20 mL -     HYDROcodone-acetaminophen (NORCO) 7.5-325 MG tablet; Take 1 tablet by mouth 3 (three) times daily.        ----------------------------------------------------------------------------------------------------------------------  Problem List Items Addressed This Visit    None    Visit Diagnoses    Bilateral sciatica    -  Primary   Relevant Medications   midazolam (VERSED) 5 MG/5ML injection 5 mg (Completed)   DDD (degenerative disc disease), lumbar       Relevant Medications   triamcinolone acetonide (KENALOG-40) injection 40 mg (Completed)   HYDROcodone-acetaminophen (NORCO) 7.5-325 MG tablet (Start on 12/07/2018)   Spinal stenosis of lumbar region with neurogenic claudication       Relevant Medications   triamcinolone acetonide (KENALOG-40) injection 40 mg (Completed)   ropivacaine (PF) 2 mg/mL (0.2%) (NAROPIN) injection 10 mL (Completed)   lidocaine (PF) (XYLOCAINE) 1 % injection 5 mL (Completed)   HYDROcodone-acetaminophen (NORCO) 7.5-325 MG tablet (Start on 12/07/2018)   Lumbar spondylosis with myelopathy       Relevant Medications   triamcinolone acetonide (KENALOG-40)  injection 40 mg (Completed)   HYDROcodone-acetaminophen (NORCO) 7.5-325 MG tablet (Start on 12/07/2018)   Chronic, continuous use of opioids       Relevant Orders   ToxASSURE Select 13 (MW), Urine   Chronic pain syndrome       Relevant Medications   triamcinolone acetonide (KENALOG-40) injection 40 mg (Completed)   ropivacaine (PF) 2 mg/mL (0.2%) (NAROPIN) injection 10 mL (Completed)   lidocaine (PF) (XYLOCAINE) 1 % injection 5 mL (Completed)   HYDROcodone-acetaminophen (NORCO) 7.5-325 MG tablet (Start on 12/07/2018)   Other Relevant Orders   ToxASSURE Select 13 (MW), Urine   Facet arthritis of lumbosacral region       Relevant Medications   triamcinolone acetonide (KENALOG-40) injection 40 mg (Completed)   HYDROcodone-acetaminophen (NORCO) 7.5-325 MG tablet (Start on 12/07/2018)        ----------------------------------------------------------------------------------------------------------------------  1. Bilateral sciatica We will proceed with a repeat epidural today.  We will have her come back in about a month or 2 for reevaluation possibly repeat injection at that time.  I want her to continue with efforts at weight loss with stretching strengthening as reviewed once again today.  2. DDD (degenerative disc disease), lumbar As above  3. Spinal stenosis of lumbar region with neurogenic claudication As above  4. Lumbar spondylosis with myelopathy   5. Chronic, continuous use of opioids I have reviewed the [facility] practitioner database information and it is appropriate.  We will refill her medications for July and August with return to clinic in 1 to 2 months. - ToxASSURE Select 13 (MW), Urine  6. Chronic pain syndrome As above - ToxASSURE Select 13 (MW), Urine  7. Facet arthritis of lumbosacral region     ----------------------------------------------------------------------------------------------------------------------  I have changed Velora Heckler. Haye's  HYDROcodone-acetaminophen. I am also having her maintain her tiotropium, traZODone, albuterol, insulin aspart, insulin glargine, Calcium Carbonate-Vitamin D3, albuterol, montelukast, cetirizine, nitroGLYCERIN, meloxicam, levETIRAcetam, sertraline, Combivent Respimat, B-D UF III MINI PEN NEEDLES, amLODipine, atorvastatin, carvedilol, isosorbide mononitrate, losartan, clopidogrel, torsemide, potassium chloride SA, and famotidine. We administered triamcinolone acetonide, sodium chloride flush, ropivacaine (PF) 2 mg/mL (0.2%),  midazolam, lidocaine (PF), and lactated ringers.   Meds ordered this encounter  Medications  . triamcinolone acetonide (KENALOG-40) injection 40 mg  . sodium chloride flush (NS) 0.9 % injection 10 mL  . ropivacaine (PF) 2 mg/mL (0.2%) (NAROPIN) injection 10 mL  . midazolam (VERSED) 5 MG/5ML injection 5 mg  . lidocaine (PF) (XYLOCAINE) 1 % injection 5 mL  . lactated ringers infusion 1,000 mL  . iopamidol (ISOVUE-M) 41 % intrathecal injection 20 mL  . HYDROcodone-acetaminophen (NORCO) 7.5-325 MG tablet    Sig: Take 1 tablet by mouth 3 (three) times daily.    Dispense:  90 tablet    Refill:  0   Patient's Medications  New Prescriptions   No medications on file  Previous Medications   ALBUTEROL (PROVENTIL HFA;VENTOLIN HFA) 108 (90 BASE) MCG/ACT INHALER    Inhale 2 puffs into the lungs every 6 (six) hours as needed for wheezing or shortness of breath.   ALBUTEROL (PROVENTIL) (2.5 MG/3ML) 0.083% NEBULIZER SOLUTION    Take 2.5 mg every 4 (four) hours as needed by nebulization for wheezing or shortness of breath.   AMLODIPINE (NORVASC) 10 MG TABLET    Take 1 tablet (10 mg total) by mouth daily.   ATORVASTATIN (LIPITOR) 40 MG TABLET    Take 1 tablet (40 mg total) by mouth at bedtime.   B-D UF III MINI PEN NEEDLES 31G X 5 MM MISC    USE WITH INSULIN PEN INJECTIONS TWICE DAILY   CALCIUM CARBONATE-VITAMIN D3 (CALCIUM 600-D) 600-400 MG-UNIT TABS    Take 1 tablet by mouth daily.     CARVEDILOL (COREG) 3.125 MG TABLET    Take 1 tablet (3.125 mg total) by mouth 2 (two) times daily with a meal.   CETIRIZINE (ZYRTEC) 10 MG TABLET    Take 10 mg by mouth daily.   CLOPIDOGREL (PLAVIX) 75 MG TABLET    Take 1 tablet (75 mg total) by mouth daily.   COMBIVENT RESPIMAT 20-100 MCG/ACT AERS RESPIMAT    INHALE 1 PUFF BY MOUTH 4 TIMES DAILY AS NEEDED FOR WHEEZING   FAMOTIDINE (PEPCID) 20 MG TABLET    Take 20 mg by mouth at bedtime.   INSULIN ASPART (NOVOLOG) 100 UNIT/ML INJECTION    Inject 10 Units into the skin 3 (three) times daily.    INSULIN GLARGINE (LANTUS) 100 UNIT/ML INJECTION    Inject 30-40 Units into the skin See admin instructions. Inject 30 units in the morning at 40 units at bedtime.   ISOSORBIDE MONONITRATE (IMDUR) 30 MG 24 HR TABLET    Take 1 tablet (30 mg total) by mouth 2 (two) times daily.   LEVETIRACETAM (KEPPRA) 750 MG TABLET    Take 750 mg by mouth 2 (two) times daily.   LOSARTAN (COZAAR) 100 MG TABLET    Take 1 tablet (100 mg total) by mouth daily.   MELOXICAM (MOBIC) 7.5 MG TABLET    Take 7.5 mg by mouth daily.   MONTELUKAST (SINGULAIR) 10 MG TABLET    Take 10 mg by mouth daily.    NITROGLYCERIN (NITROSTAT) 0.4 MG SL TABLET    DISSOLVE ONE TABLET UNDER THE TONGUE EVERY 5 MINUTES AS NEEDED FOR CHEST PAIN.  DO NOT EXCEED A TOTAL OF 3 DOSES IN 15 MINUTES   POTASSIUM CHLORIDE SA (K-DUR) 20 MEQ TABLET    Take 2 tablets (40 mEq total) by mouth 2 (two) times daily. Take extra 2 tablets (40 meq) if you take metolazone   SERTRALINE (ZOLOFT) 50 MG TABLET  Take 50 mg by mouth daily.   TIOTROPIUM (SPIRIVA) 18 MCG INHALATION CAPSULE    Place 18 mcg into inhaler and inhale daily.   TORSEMIDE (DEMADEX) 20 MG TABLET    Take 3 tablets (60 mg total) by mouth 2 (two) times daily for 30 days.   TRAZODONE (DESYREL) 50 MG TABLET    Take 50 mg by mouth at bedtime.  Modified Medications   Modified Medication Previous Medication   HYDROCODONE-ACETAMINOPHEN (NORCO) 7.5-325 MG TABLET  HYDROcodone-acetaminophen (NORCO) 7.5-325 MG tablet      Take 1 tablet by mouth 3 (three) times daily.    Take 1 tablet by mouth 3 (three) times daily for 30 days.  Discontinued Medications   No medications on file   ----------------------------------------------------------------------------------------------------------------------  Follow-up: Return in about 2 months (around 01/17/2019) for evaluation, med refill.   Procedure: L5-S1 LESI with fluoroscopic guidance and no moderate sedation  NOTE: The risks, benefits, and expectations of the procedure have been discussed and explained to the patient who was understanding and in agreement with suggested treatment plan. No guarantees were made.  DESCRIPTION OF PROCEDURE: Lumbar epidural steroid injection with no IV Versed, EKG, blood pressure, pulse, and pulse oximetry monitoring. The procedure was performed with the patient in the prone position under fluoroscopic guidance.  Sterile prep x3 was initiated and I then injected subcutaneous lidocaine to the overlying L5-S1 site after its fluoroscopic identifictation.  Using strict aseptic technique, I then advanced an 18-gauge Tuohy epidural needle in the midline using interlaminar approach via loss-of-resistance to saline technique. There was negative aspiration for heme or  CSF.  I then confirmed position with both AP and Lateral fluoroscan.  2 cc of Omnipaque were injected and a  total of 5 mL of Preservative-Free normal saline mixed with 40 mg of Kenalog and 1cc Ropicaine 0.2 percent were injected incrementally via the  epidurally placed needle. The needle was removed. The patient tolerated the injection well and was convalesced and discharged to home in stable condition. Should the patient have any post procedure difficulty they have been instructed on how to contact us for assistance.    Molli Barrows, MD

## 2018-12-14 DIAGNOSIS — K2289 Other specified disease of esophagus: Secondary | ICD-10-CM | POA: Insufficient documentation

## 2018-12-14 DIAGNOSIS — K219 Gastro-esophageal reflux disease without esophagitis: Secondary | ICD-10-CM | POA: Insufficient documentation

## 2019-01-05 ENCOUNTER — Other Ambulatory Visit: Payer: Self-pay | Admitting: Family

## 2019-01-05 MED ORDER — POTASSIUM CHLORIDE CRYS ER 20 MEQ PO TBCR: 40.0000 meq | EXTENDED_RELEASE_TABLET | Freq: Two times a day (BID) | ORAL | 11 refills | Status: DC

## 2019-01-06 ENCOUNTER — Telehealth: Payer: Self-pay | Admitting: *Deleted

## 2019-01-06 MED ORDER — HYDROCODONE-ACETAMINOPHEN 7.5-325 MG PO TABS: 1.0000 | ORAL_TABLET | Freq: Three times a day (TID) | ORAL | 0 refills | Status: DC

## 2019-01-06 NOTE — Addendum Note (Signed)
Addended by: Molli Barrows on: 01/06/2019 04:02 PM   Modules accepted: Orders

## 2019-01-06 NOTE — Telephone Encounter (Signed)
Spoke with Dr. Andree Elk- States he will e-scribe it today. Patient called and made aware.

## 2019-01-17 ENCOUNTER — Other Ambulatory Visit: Payer: Self-pay

## 2019-01-17 ENCOUNTER — Encounter: Payer: Self-pay | Admitting: Emergency Medicine

## 2019-01-17 ENCOUNTER — Inpatient Hospital Stay
Admission: EM | Admit: 2019-01-17 | Discharge: 2019-01-19 | DRG: 191 | Disposition: A | Discharge status: Home / Self Care | Payer: Medicare Other | Attending: Specialist | Admitting: Specialist

## 2019-01-17 ENCOUNTER — Emergency Department: Payer: Medicare Other

## 2019-01-17 DIAGNOSIS — Z23 Encounter for immunization: Secondary | ICD-10-CM

## 2019-01-17 DIAGNOSIS — I10 Essential (primary) hypertension: Secondary | ICD-10-CM | POA: Diagnosis present

## 2019-01-17 DIAGNOSIS — Z87891 Personal history of nicotine dependence: Secondary | ICD-10-CM

## 2019-01-17 DIAGNOSIS — I5032 Chronic diastolic (congestive) heart failure: Secondary | ICD-10-CM | POA: Diagnosis present

## 2019-01-17 DIAGNOSIS — F329 Major depressive disorder, single episode, unspecified: Secondary | ICD-10-CM | POA: Diagnosis not present

## 2019-01-17 DIAGNOSIS — I25118 Atherosclerotic heart disease of native coronary artery with other forms of angina pectoris: Secondary | ICD-10-CM | POA: Diagnosis not present

## 2019-01-17 DIAGNOSIS — T380X5A Adverse effect of glucocorticoids and synthetic analogues, initial encounter: Secondary | ICD-10-CM | POA: Diagnosis not present

## 2019-01-17 DIAGNOSIS — G43909 Migraine, unspecified, not intractable, without status migrainosus: Secondary | ICD-10-CM | POA: Diagnosis present

## 2019-01-17 DIAGNOSIS — Z833 Family history of diabetes mellitus: Secondary | ICD-10-CM

## 2019-01-17 DIAGNOSIS — R0902 Hypoxemia: Secondary | ICD-10-CM | POA: Diagnosis not present

## 2019-01-17 DIAGNOSIS — Z8249 Family history of ischemic heart disease and other diseases of the circulatory system: Secondary | ICD-10-CM | POA: Diagnosis not present

## 2019-01-17 DIAGNOSIS — Z794 Long term (current) use of insulin: Secondary | ICD-10-CM | POA: Diagnosis not present

## 2019-01-17 DIAGNOSIS — Z20828 Contact with and (suspected) exposure to other viral communicable diseases: Secondary | ICD-10-CM | POA: Diagnosis not present

## 2019-01-17 DIAGNOSIS — Z79899 Other long term (current) drug therapy: Secondary | ICD-10-CM | POA: Diagnosis not present

## 2019-01-17 DIAGNOSIS — J441 Chronic obstructive pulmonary disease with (acute) exacerbation: Secondary | ICD-10-CM | POA: Diagnosis not present

## 2019-01-17 DIAGNOSIS — G40909 Epilepsy, unspecified, not intractable, without status epilepticus: Secondary | ICD-10-CM | POA: Diagnosis not present

## 2019-01-17 DIAGNOSIS — Z818 Family history of other mental and behavioral disorders: Secondary | ICD-10-CM

## 2019-01-17 DIAGNOSIS — I110 Hypertensive heart disease with heart failure: Secondary | ICD-10-CM | POA: Diagnosis not present

## 2019-01-17 DIAGNOSIS — E1165 Type 2 diabetes mellitus with hyperglycemia: Secondary | ICD-10-CM | POA: Diagnosis not present

## 2019-01-17 DIAGNOSIS — E785 Hyperlipidemia, unspecified: Secondary | ICD-10-CM | POA: Diagnosis not present

## 2019-01-17 DIAGNOSIS — Z823 Family history of stroke: Secondary | ICD-10-CM | POA: Diagnosis not present

## 2019-01-17 DIAGNOSIS — Z809 Family history of malignant neoplasm, unspecified: Secondary | ICD-10-CM | POA: Diagnosis not present

## 2019-01-17 DIAGNOSIS — I2510 Atherosclerotic heart disease of native coronary artery without angina pectoris: Secondary | ICD-10-CM | POA: Diagnosis present

## 2019-01-17 DIAGNOSIS — R569 Unspecified convulsions: Secondary | ICD-10-CM

## 2019-01-17 DIAGNOSIS — Z951 Presence of aortocoronary bypass graft: Secondary | ICD-10-CM | POA: Diagnosis not present

## 2019-01-17 DIAGNOSIS — R0602 Shortness of breath: Secondary | ICD-10-CM | POA: Diagnosis present

## 2019-01-17 DIAGNOSIS — E119 Type 2 diabetes mellitus without complications: Secondary | ICD-10-CM

## 2019-01-17 DIAGNOSIS — Z7902 Long term (current) use of antithrombotics/antiplatelets: Secondary | ICD-10-CM

## 2019-01-17 LAB — CBC WITH DIFFERENTIAL/PLATELET
Abs Immature Granulocytes: 0.01 10*3/uL (ref 0.00–0.07)
Basophils Absolute: 0.1 10*3/uL (ref 0.0–0.1)
Basophils Relative: 1 %
Eosinophils Absolute: 0.5 10*3/uL (ref 0.0–0.5)
Eosinophils Relative: 8 %
HCT: 35.2 % — ABNORMAL LOW (ref 36.0–46.0)
Hemoglobin: 10.9 g/dL — ABNORMAL LOW (ref 12.0–15.0)
Immature Granulocytes: 0 %
Lymphocytes Relative: 35 %
Lymphs Abs: 2 10*3/uL (ref 0.7–4.0)
MCH: 26.6 pg (ref 26.0–34.0)
MCHC: 31 g/dL (ref 30.0–36.0)
MCV: 85.9 fL (ref 80.0–100.0)
Monocytes Absolute: 0.4 10*3/uL (ref 0.1–1.0)
Monocytes Relative: 7 %
Neutro Abs: 2.9 10*3/uL (ref 1.7–7.7)
Neutrophils Relative %: 49 %
Platelets: 231 10*3/uL (ref 150–400)
RBC: 4.1 MIL/uL (ref 3.87–5.11)
RDW: 15.9 % — ABNORMAL HIGH (ref 11.5–15.5)
WBC: 5.8 10*3/uL (ref 4.0–10.5)
nRBC: 0 % (ref 0.0–0.2)

## 2019-01-17 LAB — INFLUENZA PANEL BY PCR (TYPE A & B)
Influenza A By PCR: NEGATIVE
Influenza B By PCR: NEGATIVE

## 2019-01-17 LAB — COMPREHENSIVE METABOLIC PANEL
ALT: 14 U/L (ref 0–44)
AST: 16 U/L (ref 15–41)
Albumin: 4 g/dL (ref 3.5–5.0)
Alkaline Phosphatase: 166 U/L — ABNORMAL HIGH (ref 38–126)
Anion gap: 11 (ref 5–15)
BUN: 19 mg/dL (ref 8–23)
CO2: 25 mmol/L (ref 22–32)
Calcium: 9.1 mg/dL (ref 8.9–10.3)
Chloride: 104 mmol/L (ref 98–111)
Creatinine, Ser: 1.09 mg/dL — ABNORMAL HIGH (ref 0.44–1.00)
GFR calc Af Amer: 59 mL/min — ABNORMAL LOW (ref >60)
GFR calc non Af Amer: 51 mL/min — ABNORMAL LOW (ref >60)
Glucose, Bld: 164 mg/dL — ABNORMAL HIGH (ref 70–99)
Potassium: 3.8 mmol/L (ref 3.5–5.1)
Sodium: 140 mmol/L (ref 135–145)
Total Bilirubin: 0.6 mg/dL (ref 0.3–1.2)
Total Protein: 7.5 g/dL (ref 6.5–8.1)

## 2019-01-17 LAB — SARS CORONAVIRUS 2 BY RT PCR (HOSPITAL ORDER, PERFORMED IN ~~LOC~~ HOSPITAL LAB): SARS Coronavirus 2: NEGATIVE

## 2019-01-17 LAB — PROCALCITONIN: Procalcitonin: <0.1 ng/mL

## 2019-01-17 LAB — BRAIN NATRIURETIC PEPTIDE: B Natriuretic Peptide: 120 pg/mL — ABNORMAL HIGH (ref 0.0–100.0)

## 2019-01-17 LAB — LACTIC ACID, PLASMA: Lactic Acid, Venous: 1.3 mmol/L (ref 0.5–1.9)

## 2019-01-17 MED ORDER — IPRATROPIUM-ALBUTEROL 0.5-2.5 (3) MG/3ML IN SOLN
3.0000 mL | Freq: Once | RESPIRATORY_TRACT | Status: AC
  Administered 2019-01-17: 3 mL via RESPIRATORY_TRACT
  Filled 2019-01-17: qty 3

## 2019-01-17 MED ORDER — IPRATROPIUM BROMIDE 0.02 % IN SOLN
0.5000 mg | Freq: Four times a day (QID) | RESPIRATORY_TRACT | Status: DC
  Administered 2019-01-18: 0.5 mg via RESPIRATORY_TRACT
  Filled 2019-01-17: qty 2.5

## 2019-01-17 MED ORDER — METHYLPREDNISOLONE SODIUM SUCC 125 MG IJ SOLR
125.0000 mg | Freq: Once | INTRAMUSCULAR | Status: AC
  Administered 2019-01-17: 125 mg via INTRAVENOUS
  Filled 2019-01-17: qty 2

## 2019-01-17 MED ORDER — IPRATROPIUM BROMIDE 0.02 % IN SOLN: 1.0000 mg | Freq: Once | RESPIRATORY_TRACT | Status: DC

## 2019-01-17 MED ORDER — ALBUTEROL SULFATE (2.5 MG/3ML) 0.083% IN NEBU
INHALATION_SOLUTION | RESPIRATORY_TRACT | Status: AC
  Administered 2019-01-17: 21:00:00 10 mg
  Filled 2019-01-17: qty 12

## 2019-01-17 MED ORDER — ALBUTEROL SULFATE HFA 108 (90 BASE) MCG/ACT IN AERS
6.0000 | INHALATION_SPRAY | Freq: Once | RESPIRATORY_TRACT | Status: AC
  Administered 2019-01-17: 6 via RESPIRATORY_TRACT
  Filled 2019-01-17: qty 6.7

## 2019-01-17 MED ORDER — ALBUTEROL (5 MG/ML) CONTINUOUS INHALATION SOLN
10.0000 mg/h | INHALATION_SOLUTION | Freq: Once | RESPIRATORY_TRACT | Status: DC
  Filled 2019-01-17: qty 20

## 2019-01-17 MED ORDER — AZITHROMYCIN 250 MG PO TABS
500.0000 mg | ORAL_TABLET | Freq: Every day | ORAL | Status: DC
  Administered 2019-01-17 – 2019-01-18 (×2): 500 mg via ORAL
  Filled 2019-01-17: qty 1
  Filled 2019-01-17: qty 2

## 2019-01-17 MED ORDER — HYDROCODONE-ACETAMINOPHEN 5-325 MG PO TABS
1.0000 | ORAL_TABLET | Freq: Three times a day (TID) | ORAL | Status: DC
  Administered 2019-01-18 (×2): 1 via ORAL
  Filled 2019-01-17 (×2): qty 1

## 2019-01-17 MED ORDER — SODIUM CHLORIDE 0.9 % IV SOLN
Freq: Once | INTRAVENOUS | Status: AC
  Administered 2019-01-17: 18:00:00 via INTRAVENOUS

## 2019-01-17 MED ORDER — LEVALBUTEROL HCL 1.25 MG/0.5ML IN NEBU
1.2500 mg | INHALATION_SOLUTION | Freq: Four times a day (QID) | RESPIRATORY_TRACT | Status: DC
  Administered 2019-01-18: 02:00:00 1.25 mg via RESPIRATORY_TRACT
  Filled 2019-01-17 (×3): qty 0.5

## 2019-01-17 NOTE — ED Notes (Signed)
Provided patient with meal tray

## 2019-01-17 NOTE — ED Notes (Signed)
ED TO INPATIENT HANDOFF REPORT  ED Nurse Name and Phone #: Bascom Levels Name/Age/Gender Tina Hayes 73 y.o. female Room/Bed: ED06A/ED06A  Code Status   Code Status: Prior  Home/SNF/Other Home Patient oriented to: self, place, time and situation Is this baseline? Yes   Triage Complete: Triage complete  Chief Complaint breathing diff ems  Triage Note Pt via EMS from home with c/o SOB, xh of COPD. VSS per EMS. PT in NAD    Allergies Allergies  Allergen Reactions  . Aspirin Anaphylaxis    Level of Care/Admitting Diagnosis ED Disposition    ED Disposition Condition [facility] Area: [place] [[id number]]  Level of Care: Med-Surg [16]  Covid Evaluation: Confirmed COVID Negative  Diagnosis: [id number]  Admitting Physician: Lance Coon [id number]  Attending Physician: Lance Coon [phone]  Estimated length of stay: past midnight tomorrow  Certification:: I certify this patient will need inpatient services for at least 2 midnights  PT Class (Do Not Modify): Inpatient [101]  PT Acc Code (Do Not Modify): Private [1]       B Medical/Surgery History Past Medical History:  Diagnosis Date  . (HFpEF) heart failure with preserved ejection fraction ([place])    a. 05/2016 Echo: EF 60-65%, mild to mod LVH, Gr1 DD, mild MR, mildly dil LA, mod TR, mildly to mod increased PASP.  [name] Acute diastolic heart failure ([place]) 01/27/2017  . Anxiety   . Chest pain 06/16/2016  . CHF (congestive heart failure) ([place])   . Chronic back pain   . Chronic diastolic congestive heart failure ([place]) 02/13/2017  . COPD (chronic obstructive pulmonary disease) ([place])   . Coronary artery disease    a. s/p remote PCI x 5;  b. 2006 s/p CABG x 3 ([place]); b. 05/2016 MV: attenuation corrected images w/o ischemia or wma-->Med rx.  . Coronary artery disease of native artery of native heart with stable angina  pectoris ([place]) 06/17/2016  . Depression   . Diabetes mellitus without complication ([place])   . Essential hypertension 06/30/2016  . Heart attack ([place])    Total of 3 per pt.  . Hypertension   . Hypertensive urgency 06/03/2015  . Seizure ([place])   . Seizures ([place])    Past Surgical History:  Procedure Laterality Date  . ABDOMINAL HYSTERECTOMY    . ANKLE SURGERY Right   . CATARACT EXTRACTION W/ INTRAOCULAR LENS  IMPLANT, BILATERAL    . COLONOSCOPY WITH PROPOFOL N/A 05/04/2017   Procedure: COLONOSCOPY WITH PROPOFOL;  Surgeon: [name], MD;  Location[facility]COPY;  Service: Endoscopy;  Laterality: N/A;  . CORONARY ANGIOPLASTY    . CORONARY ARTERY BYPASS GRAFT     TRIPLE BYPASS  . ESOPHAGOGASTRODUODENOSCOPY (EGD) WITH PROPOFOL N/A 05/04/2017   Procedure: ESOPHAGOGASTRODUODENOSCOPY (EGD) WITH PROPOFOL;  Surgeon: [name], MD;  Location: [facility] ENDOSCOPY;  Service: Endoscopy;  Laterality: N/A;  . EXCISION MASS LOWER EXTREMETIES Right 03/02/2018   Procedure: EXCISION SOFT TISSUE MASS FROM MEDIAL ASPECT OF RIGHT ANKLE;  Surgeon: [name], MD;  Location: [facility] ORS;  Service: Orthopedics;  Laterality: Right;  . FRACTURE SURGERY    . KNEE SURGERY Right   . MOUTH SURGERY Left 07/14/2017     A IV Location/Drains/Wounds Patient Lines/Drains/Airways Status   Active Line/Drains/Airways    Name:   Placement date:   Placement time:   Site:   Days:   Peripheral IV 01/17/19 Right Antecubital  01/17/19    1725    Antecubital   less than 1   Epidural Catheter 11/16/18   11/16/18    1503     62   Incision (Closed) 03/02/18 Leg Right   03/02/18    1431     321          Intake/Output Last 24 hours No intake or output data in the 24 hours ending 01/17/19 2354  Labs/Imaging Results for orders placed or performed during the hospital encounter of 01/17/19 (from the past 48 hour(s))  CBC with Differential     Status: Abnormal   Collection Time: 01/17/19  5:09 PM  Result Value Ref Range    WBC 5.8 4.0 - 10.5 K/uL   RBC 4.10 3.87 - 5.11 MIL/uL   Hemoglobin 10.9 (L) 12.0 - 15.0 g/dL   HCT 35.2 (L) 36.0 - 46.0 %   MCV 85.9 80.0 - 100.0 fL   MCH 26.6 26.0 - 34.0 pg   MCHC 31.0 30.0 - 36.0 g/dL   RDW 15.9 (H) 11.5 - 15.5 %   Platelets 231 150 - 400 K/uL   nRBC 0.0 0.0 - 0.2 %   Neutrophils Relative % 49 %   Neutro Abs 2.9 1.7 - 7.7 K/uL   Lymphocytes Relative 35 %   Lymphs Abs 2.0 0.7 - 4.0 K/uL   Monocytes Relative 7 %   Monocytes Absolute 0.4 0.1 - 1.0 K/uL   Eosinophils Relative 8 %   Eosinophils Absolute 0.5 0.0 - 0.5 K/uL   Basophils Relative 1 %   Basophils Absolute 0.1 0.0 - 0.1 K/uL   Immature Granulocytes 0 %   Abs Immature Granulocytes 0.01 0.00 - 0.07 K/uL    Comment: Performed at [facility], [address] [place], [place] [address]  Comprehensive metabolic panel     Status: Abnormal   Collection Time: 01/17/19  5:09 PM  Result Value Ref Range   Sodium 140 135 - 145 mmol/L   Potassium 3.8 3.5 - 5.1 mmol/L   Chloride 104 98 - 111 mmol/L   CO2 25 22 - 32 mmol/L   Glucose, Bld 164 (H) 70 - 99 mg/dL   BUN 19 8 - 23 mg/dL   Creatinine, Ser 1.09 (H) 0.44 - 1.00 mg/dL   Calcium 9.1 8.9 - 10.3 mg/dL   Total Protein 7.5 6.5 - 8.1 g/dL   Albumin 4.0 3.5 - 5.0 g/dL   AST 16 15 - 41 U/L   ALT 14 0 - 44 U/L   Alkaline Phosphatase 166 (H) 38 - 126 U/L   Total Bilirubin 0.6 0.3 - 1.2 mg/dL   GFR calc non Af Amer 51 (L) >60 mL/min   GFR calc Af Amer 59 (L) >60 mL/min   Anion gap 11 5 - 15    Comment: Performed at [facility], [address] [place], [place] [address]  Procalcitonin - Baseline     Status: None   Collection Time: 01/17/19  5:09 PM  Result Value Ref Range   Procalcitonin <0.10 ng/mL    Comment:        Interpretation: PCT (Procalcitonin) <= 0.5 ng/mL: Systemic infection (sepsis) is not likely. Local bacterial infection is possible. (NOTE)       Sepsis PCT Algorithm           Lower Respiratory Tract  Infection PCT Algorithm    ----------------------------     ----------------------------         PCT < 0.25 ng/mL                PCT < 0.10 ng/mL         Strongly encourage             Strongly discourage   discontinuation of antibiotics    initiation of antibiotics    ----------------------------     -----------------------------       PCT 0.25 - 0.50 ng/mL            PCT 0.10 - 0.25 ng/mL               OR       >80% decrease in PCT            Discourage initiation of                                            antibiotics      Encourage discontinuation           of antibiotics    ----------------------------     -----------------------------         PCT >= 0.50 ng/mL              PCT 0.26 - 0.50 ng/mL               AND        <80% decrease in PCT             Encourage initiation of                                             antibiotics       Encourage continuation           of antibiotics    ----------------------------     -----------------------------        PCT >= 0.50 ng/mL                  PCT > 0.50 ng/mL               AND         increase in PCT                  Strongly encourage                                      initiation of antibiotics    Strongly encourage escalation           of antibiotics                                     -----------------------------                                           PCT <= 0.25 ng/mL  OR                                        > 80% decrease in PCT                                     Discontinue / Do not initiate                                             antibiotics Performed at [facility], [address] [place], [place] [address]   Brain natriuretic peptide     Status: Abnormal   Collection Time: 01/17/19  5:26 PM  Result Value Ref Range   B Natriuretic Peptide 120.0 (H) 0.0 - 100.0 pg/mL    Comment: Performed at [facility], [address] [place], [place] [address]  Lactic acid, plasma     Status: None   Collection Time: 01/17/19  5:28 PM  Result Value Ref Range   Lactic Acid, Venous 1.3 0.5 - 1.9 mmol/L    Comment: Performed at [facility], [address] [place], [place] [address]  SARS Coronavirus 2 [facility] order, Performed in [facility] hospital lab) Nasopharyngeal Nasopharyngeal Swab     Status: None   Collection Time: 01/17/19  5:44 PM   Specimen: Nasopharyngeal Swab  Result Value Ref Range   SARS Coronavirus 2 NEGATIVE NEGATIVE    Comment: (NOTE) If result is NEGATIVE SARS-CoV-2 target nucleic acids are NOT DETECTED. The SARS-CoV-2 RNA is generally detectable in upper and lower  respiratory specimens during the acute phase of infection. The lowest  concentration of SARS-CoV-2 viral copies this assay can detect is 250  copies / mL. A negative result does not preclude SARS-CoV-2 infection  and should not be used as the sole basis for treatment or other  patient management decisions.  A negative result may occur with  improper specimen collection / handling, submission of specimen other  than nasopharyngeal swab, presence of viral mutation(s) within the  areas targeted by this assay, and inadequate number of viral copies  (<250 copies / mL). A negative result must be combined with clinical  observations, patient history, and epidemiological information. If result is POSITIVE SARS-CoV-2 target nucleic acids are DETECTED. The SARS-CoV-2 RNA is generally detectable in upper and lower  respiratory specimens dur ing the acute phase of infection.  Positive  results are indicative of active infection with SARS-CoV-2.  Clinical  correlation with patient history and other diagnostic information is  necessary to determine patient infection status.  Positive results do  not rule out bacterial infection or co-infection with other viruses. If result is PRESUMPTIVE POSTIVE SARS-CoV-2 nucleic acids MAY BE  PRESENT.   A presumptive positive result was obtained on the submitted specimen  and confirmed on repeat testing.  While 2019 novel coronavirus  (SARS-CoV-2) nucleic acids may be present in the submitted sample  additional confirmatory testing may be necessary for epidemiological  and / or clinical management purposes  to differentiate between  SARS-CoV-2 and other Sarbecovirus currently known to infect humans.  If clinically indicated additional testing with an alternate test  methodology [phone]) is advised. The SARS-CoV-2 RNA is generally  detectable in upper and lower respiratory sp ecimens during the acute  phase of infection. The expected result is Negative. Fact Sheet for Patients:  StrictlyIdeas.no Fact Sheet for Healthcare Providers: BankingDealers.co.za This test is not yet approved or cleared by the [place] FDA and has been authorized for detection and/or diagnosis of SARS-CoV-2 by FDA under an Emergency Use Authorization (EUA).  This EUA will remain in effect (meaning this test can be used) for the duration of the COVID-19 declaration under Section 564(b)(1) of the Act, 21 U.S.C. section 360bbb-3(b)(1), unless the authorization is terminated or revoked sooner. Performed at [facility], [address] [place], [place] [address]   Influenza panel by PCR (type A & B)     Status: None   Collection Time: 01/17/19 10:37 PM  Result Value Ref Range   Influenza A By PCR NEGATIVE NEGATIVE   Influenza B By PCR NEGATIVE NEGATIVE    Comment: (NOTE) The Xpert Xpress Flu assay is intended as an aid in the diagnosis of  influenza and should not be used as a sole basis for treatment.  This  assay is FDA approved for nasopharyngeal swab specimens only. Nasal  washings and aspirates are unacceptable for Xpert Xpress Flu testing. Performed at [facility], [address] [place], [place] [address]    Dg Chest  Portable 1 View  Result Date: 01/17/2019 CLINICAL DATA:  Shortness of breath EXAM: PORTABLE CHEST 1 VIEW COMPARISON:  May 25, 2018 FINDINGS: There is mild scarring in the left base. Lungs elsewhere are clear. Heart is borderline enlarged with pulmonary vascularity normal. No adenopathy. There is a focal hiatal hernia. No bone lesions. IMPRESSION: Scarring left base. No edema or consolidation. Borderline cardiac enlargement. Focal hiatal hernia. Status post coronary artery bypass grafting. Electronically Signed   By: Lowella Grip III M.D.   On: 01/17/2019 18:03    Pending Labs [employer] (From admission, onward)    Start     Ordered   Signed and Held  Hemoglobin A1c  Once,   R    Comments: To assess prior glycemic control    Signed and Held   Signed and Held  CBC  (enoxaparin (LOVENOX)    CrCl >/= 30 ml/min)  Once,   R    Comments: Baseline for enoxaparin therapy IF NOT ALREADY DRAWN.  Notify MD if PLT < 100 K.    Signed and Held   Signed and Held  Creatinine, serum  (enoxaparin (LOVENOX)    CrCl >/= 30 ml/min)  Once,   R    Comments: Baseline for enoxaparin therapy IF NOT ALREADY DRAWN.    Signed and Held   Signed and Held  Creatinine, serum  (enoxaparin (LOVENOX)    CrCl >/= 30 ml/min)  Weekly,   R    Comments: while on enoxaparin therapy    Signed and Held   Signed and Held  Basic metabolic panel  Tomorrow morning,   R     Signed and Held   Signed and Held  CBC  Tomorrow morning,   R     Signed and Held          Vitals/Pain Today's Vitals   01/17/19 2200 01/17/19 2330 01/17/19 2338 01/17/19 2345  BP: (!) 150/89 (!) 163/92 (!) 173/75   Pulse: 68 69 70 66  Resp:   18 (!) 26  Temp:      TempSrc:      SpO2: 100% 100% 100% 100%  PainSc:  Isolation Precautions Droplet precaution  Medications Medications  albuterol (PROVENTIL,VENTOLIN) solution continuous neb (0 mg/hr Nebulization Hold 01/17/19 2222)  ipratropium (ATROVENT) nebulizer solution 1 mg (1  mg Nebulization Not Given 01/17/19 2256)  azithromycin (ZITHROMAX) tablet 500 mg (500 mg Oral Given 01/17/19 2335)  HYDROcodone-acetaminophen (NORCO/VICODIN) 5-325 MG per tablet 1 tablet (has no administration in time range)  levalbuterol (XOPENEX) nebulizer solution 1.25 mg (has no administration in time range)  ipratropium (ATROVENT) nebulizer solution 0.5 mg (has no administration in time range)  0.9 %  sodium chloride infusion ( Intravenous Stopped 01/17/19 2330)  methylPREDNISolone sodium succinate (SOLU-MEDROL) 125 mg/2 mL injection 125 mg (125 mg Intravenous Given 01/17/19 1745)  albuterol (VENTOLIN HFA) 108 (90 Base) MCG/ACT inhaler 6 puff (6 puffs Inhalation Given 01/17/19 1833)  ipratropium-albuterol (DUONEB) 0.5-2.5 (3) MG/3ML nebulizer solution 3 mL (3 mLs Nebulization Given 01/17/19 1923)  albuterol (PROVENTIL) (2.5 MG/3ML) 0.083% nebulizer solution (10 mg  Given 01/17/19 2048)    Mobility walks with person assist Low fall risk   Focused Assessments Pulmonary Assessment Handoff:  Lung sounds: Bilateral Breath Sounds: Expiratory wheezes O2 Device: Nasal Cannula O2 Flow Rate (L/min): 2 L/min      R Recommendations: See Admitting Provider Note  Report given to:   Additional Notes:

## 2019-01-17 NOTE — ED Notes (Signed)
PT ambulatory to restroom on Room air, pt tachypneac but O2 sats remain 99%

## 2019-01-17 NOTE — ED Notes (Signed)
This RN attempted for 20g above the wrist x 2, unsuccessful. Pt tolerated well. Pt resting in bed playing on phone. NSR on the monitor, O2 sats 99% at this time. Will continue to monitor. Anda Kraft, RN made aware.

## 2019-01-17 NOTE — H&P (Signed)
[facility] [place] NAME: Tina Hayes    MR#:  [id number]  DATE OF BIRTH:  04/12/1946  DATE OF ADMISSION:  01/17/2019  PRIMARY CARE PHYSICIAN: Soles, Howell Rucks, MD   REQUESTING/REFERRING PHYSICIAN: Ellender Hose, MD  CHIEF COMPLAINT:   Chief Complaint  Patient presents with  . Shortness of Breath    HISTORY OF PRESENT ILLNESS:  Tina Hayes  is a 73 y.o. female who presents with chief complaint as above.  Patient presents the ED with a complaint of shortness of breath.  She states that it has been progressive today.  The ED her work-up is consistent with COPD exacerbation.  She is coronavirus negative, as well as flu negative.  Hospitalist were called for admission  PAST MEDICAL HISTORY:   Past Medical History:  Diagnosis Date  . (HFpEF) heart failure with preserved ejection fraction ([place])    a. 05/2016 Echo: EF 60-65%, mild to mod LVH, Gr1 DD, mild MR, mildly dil LA, mod TR, mildly to mod increased PASP.  [name] Acute diastolic heart failure ([place]) 01/27/2017  . Anxiety   . Chest pain 06/16/2016  . CHF (congestive heart failure) ([place])   . Chronic back pain   . Chronic diastolic congestive heart failure ([place]) 02/13/2017  . COPD (chronic obstructive pulmonary disease) ([place])   . Coronary artery disease    a. s/p remote PCI x 5;  b. 2006 s/p CABG x 3 ([place]); b. 05/2016 MV: attenuation corrected images w/o ischemia or wma-->Med rx.  . Coronary artery disease of native artery of native heart with stable angina pectoris ([place]) 06/17/2016  . Depression   . Diabetes mellitus without complication ([place])   . Essential hypertension 06/30/2016  . Heart attack ([place])    Total of 3 per pt.  . Hypertension   . Hypertensive urgency 06/03/2015  . Seizure ([place])   . Seizures ([place])      PAST SURGICAL HISTORY:   Past Surgical History:  Procedure Laterality Date  . ABDOMINAL HYSTERECTOMY    . ANKLE SURGERY Right   .  CATARACT EXTRACTION W/ INTRAOCULAR LENS  IMPLANT, BILATERAL    . COLONOSCOPY WITH PROPOFOL N/A 05/04/2017   Procedure: COLONOSCOPY WITH PROPOFOL;  Surgeon: [name], MD;  Location: [facility] ENDOSCOPY;  Service: Endoscopy;  Laterality: N/A;  . CORONARY ANGIOPLASTY    . CORONARY ARTERY BYPASS GRAFT     TRIPLE BYPASS  . ESOPHAGOGASTRODUODENOSCOPY (EGD) WITH PROPOFOL N/A 05/04/2017   Procedure: ESOPHAGOGASTRODUODENOSCOPY (EGD) WITH PROPOFOL;  Surgeon: [name], MD;  Location: [facility] ENDOSCOPY;  Service: Endoscopy;  Laterality: N/A;  . EXCISION MASS LOWER EXTREMETIES Right 03/02/2018   Procedure: EXCISION SOFT TISSUE MASS FROM MEDIAL ASPECT OF RIGHT ANKLE;  Surgeon: [name], MD;  Location: [facility] ORS;  Service: Orthopedics;  Laterality: Right;  . FRACTURE SURGERY    . KNEE SURGERY Right   . MOUTH SURGERY Left 07/14/2017     SOCIAL HISTORY:   Social History   Tobacco Use  . Smoking status: Former Smoker    Packs/day: 0.25    Years: 20.00    Pack years: 5.00    Types: Cigarettes    Quit date: 07/01/2006    Years since quitting: 12.5  . Smokeless tobacco: Never Used  Substance Use Topics  . Alcohol use: No    Alcohol/week: 0.0 standard drinks     FAMILY HISTORY:   Family History  Problem Relation Age of Onset  .  Diabetes Other   . Hypertension Other   . Diabetes Mother   . Heart failure Mother   . Heart disease Mother   . Heart attack Mother   . Stroke Mother   . Depression Mother   . Hypertension Mother   . Cancer Sister        brain  . Hypertension Sister   . Diabetes Brother   . Hypertension Brother   . Heart failure Sister   . Heart attack Sister   . SIDS Sister      DRUG ALLERGIES:   Allergies  Allergen Reactions  . Aspirin Anaphylaxis    MEDICATIONS AT HOME:   Prior to Admission medications   Medication Sig Start Date End Date Taking? Authorizing Provider  albuterol (PROVENTIL HFA;VENTOLIN HFA) 108 (90 Base) MCG/ACT inhaler Inhale 2 puffs  into the lungs every 6 (six) hours as needed for wheezing or shortness of breath. 08/30/15  Yes Epifanio Lesches, MD  albuterol (PROVENTIL) (2.5 MG/3ML) 0.083% nebulizer solution Take 2.5 mg every 4 (four) hours as needed by nebulization for wheezing or shortness of breath.   Yes [provider]  amLODipine (NORVASC) 10 MG tablet Take 1 tablet (10 mg total) by mouth daily. 08/17/18  Yes Minna Merritts, MD  atorvastatin (LIPITOR) 40 MG tablet Take 1 tablet (40 mg total) by mouth at bedtime. 08/17/18  Yes Gollan, Kathlene November, MD  Calcium Carbonate-Vitamin D3 (CALCIUM 600-D) 600-400 MG-UNIT TABS Take 1 tablet by mouth daily.    Yes [provider]  carvedilol (COREG) 3.125 MG tablet Take 1 tablet (3.125 mg total) by mouth 2 (two) times daily with a meal. 08/17/18  Yes Gollan, Kathlene November, MD  cetirizine (ZYRTEC) 10 MG tablet Take 10 mg by mouth daily.   Yes [provider]  clopidogrel (PLAVIX) 75 MG tablet Take 1 tablet (75 mg total) by mouth daily. 08/17/18  Yes Gollan, Kathlene November, MD  COMBIVENT RESPIMAT 20-100 MCG/ACT AERS respimat INHALE 1 PUFF BY MOUTH 4 TIMES DAILY AS NEEDED FOR WHEEZING 07/30/18  Yes [provider]  famotidine (PEPCID) 20 MG tablet Take 20 mg by mouth at bedtime.   Yes [provider]  HYDROcodone-acetaminophen (NORCO) 7.5-325 MG tablet Take 1 tablet by mouth 3 (three) times daily. 01/06/19 02/05/19 Yes Molli Barrows, MD  insulin aspart (NOVOLOG) 100 UNIT/ML injection Inject 10 Units into the skin 3 (three) times daily.    Yes [provider]  insulin glargine (LANTUS) 100 UNIT/ML injection Inject 30-40 Units into the skin See admin instructions. Inject 30 units in the morning at 40 units at bedtime.   Yes [provider]  isosorbide mononitrate (IMDUR) 30 MG 24 hr tablet Take 1 tablet (30 mg total) by mouth 2 (two) times daily. 08/17/18  Yes Minna Merritts, MD  levETIRAcetam (KEPPRA) 750 MG tablet Take 750 mg by mouth 2  (two) times daily.   Yes [provider]  losartan (COZAAR) 100 MG tablet Take 1 tablet (100 mg total) by mouth daily. 08/17/18  Yes Gollan, Kathlene November, MD  meloxicam (MOBIC) 7.5 MG tablet Take 7.5 mg by mouth daily as needed.    Yes [provider]  montelukast (SINGULAIR) 10 MG tablet Take 10 mg by mouth daily.  11/05/17  Yes [provider]  nitroGLYCERIN (NITROSTAT) 0.4 MG SL tablet DISSOLVE ONE TABLET UNDER THE TONGUE EVERY 5 MINUTES AS NEEDED FOR CHEST PAIN.  DO NOT EXCEED A TOTAL OF 3 DOSES IN 15 MINUTES 07/09/18  Yes Gollan, Kathlene November, MD  potassium chloride SA (K-DUR) 20 MEQ tablet Take 2 tablets (40 mEq total) by mouth 2 (two) times daily. Take extra 2 tablets (40 meq) if you take metolazone 01/05/19  Yes Darylene Price A, FNP  sertraline (ZOLOFT) 50 MG tablet Take 50 mg by mouth daily.   Yes [provider]  tiotropium (SPIRIVA) 18 MCG inhalation capsule Place 18 mcg into inhaler and inhale daily.   Yes [provider]  torsemide (DEMADEX) 20 MG tablet Take 3 tablets (60 mg total) by mouth 2 (two) times daily for 30 days. 08/17/18 01/17/19 Yes Gollan, Kathlene November, MD  traZODone (DESYREL) 50 MG tablet Take 50 mg by mouth at bedtime.   Yes [provider]  B-D UF III MINI PEN NEEDLES 31G X 5 MM MISC USE WITH INSULIN PEN INJECTIONS TWICE DAILY 08/05/18   [provider]    REVIEW OF SYSTEMS:  Review of Systems  Constitutional: Negative for chills, fever, malaise/fatigue and weight loss.  HENT: Negative for ear pain, hearing loss and tinnitus.   Eyes: Negative for blurred vision, double vision, pain and redness.  Respiratory: Positive for shortness of breath and wheezing. Negative for cough and hemoptysis.   Cardiovascular: Negative for chest pain, palpitations, orthopnea and leg swelling.  Gastrointestinal: Negative for abdominal pain, constipation, diarrhea, nausea and vomiting.  Genitourinary: Negative for dysuria, frequency and  hematuria.  Musculoskeletal: Negative for back pain, joint pain and neck pain.  Skin:       No acne, rash, or lesions  Neurological: Negative for dizziness, tremors, focal weakness and weakness.  Endo/Heme/Allergies: Negative for polydipsia. Does not bruise/bleed easily.  Psychiatric/Behavioral: Negative for depression. The patient is not nervous/anxious and does not have insomnia.      VITAL SIGNS:   Vitals:   01/17/19 1930 01/17/19 2040 01/17/19 2156 01/17/19 2200  BP: (!) 165/75 (!) 166/74 (!) 164/76 (!) 150/89  Pulse: (!) 55 (!) 111 69 68  Resp: 18 16    Temp:      TempSrc:      SpO2: 100% 100% 95% 100%   Wt Readings from Last 3 Encounters:  11/16/18 136.1 kg  09/29/18 135.2 kg  08/31/18 (!) 137 kg    PHYSICAL EXAMINATION:  Physical Exam  Vitals reviewed. Constitutional: She is oriented to person, place, and time. She appears well-developed and well-nourished. No distress.  HENT:  Head: Normocephalic and atraumatic.  Mouth/Throat: Oropharynx is clear and moist.  Eyes: Pupils are equal, round, and reactive to light. Conjunctivae and EOM are normal. No scleral icterus.  Neck: Normal range of motion. Neck supple. No JVD present. No thyromegaly present.  Cardiovascular: Normal rate, regular rhythm and intact distal pulses. Exam reveals no gallop and no friction rub.  No murmur heard. Respiratory: She is in respiratory distress (Mild). She has wheezes. She has no rales.  GI: Soft. Bowel sounds are normal. She exhibits no distension. There is no abdominal tenderness.  Musculoskeletal: Normal range of motion.        General: No edema.     Comments: No arthritis, no gout  Lymphadenopathy:    She has no cervical adenopathy.  Neurological: She is alert and oriented to person, place, and time. No cranial nerve deficit.  No dysarthria, no aphasia  Skin: Skin is warm and dry. No rash noted. No erythema.  Psychiatric: She has a normal mood and affect. Her behavior is normal.  Judgment and thought content normal.    LABORATORY PANEL:  CBC Recent Labs  Lab 01/17/19 1709  WBC 5.8  HGB 10.9*  HCT 35.2*  PLT 231   ------------------------------------------------------------------------------------------------------------------  Chemistries  Recent Labs  Lab 01/17/19 1709  NA 140  K 3.8  CL 104  CO2 25  GLUCOSE 164*  BUN 19  CREATININE 1.09*  CALCIUM 9.1  AST 16  ALT 14  ALKPHOS 166*  BILITOT 0.6   ------------------------------------------------------------------------------------------------------------------  Cardiac Enzymes No results for input(s): TROPONINI in the last 168 hours. ------------------------------------------------------------------------------------------------------------------  RADIOLOGY:  Dg Chest Portable 1 View  Result Date: 01/17/2019 CLINICAL DATA:  Shortness of breath EXAM: PORTABLE CHEST 1 VIEW COMPARISON:  May 25, 2018 FINDINGS: There is mild scarring in the left base. Lungs elsewhere are clear. Heart is borderline enlarged with pulmonary vascularity normal. No adenopathy. There is a focal hiatal hernia. No bone lesions. IMPRESSION: Scarring left base. No edema or consolidation. Borderline cardiac enlargement. Focal hiatal hernia. Status post coronary artery bypass grafting. Electronically Signed   By: Lowella Grip III M.D.   On: 01/17/2019 18:03    EKG:   Orders placed or performed during the hospital encounter of 01/17/19  . EKG 12-Lead  . EKG 12-Lead    IMPRESSION AND PLAN:  Principal Problem:   COPD with acute exacerbation ([place]) -patient's breathing is somewhat improved after nebs and initial steroid treatment.  We will continue with IV steroids, nebulizers, PRN antitussive, azithromycin, and home meds Active Problems:   Coronary artery disease of native artery of native heart with stable angina pectoris ([place]) -continue home medications   Essential hypertension -home dose antihypertensives    Diabetes mellitus ([place]) -sliding scale insulin   Chronic diastolic congestive heart failure ([place]) -not in acute exacerbation, continue home meds   Seizures ([place]) -home dose antiepileptic  Chart review performed and case discussed with ED provider. Labs, imaging and/or ECG reviewed by provider and discussed with patient/family. Management plans discussed with the patient and/or family.  COVID-19 status: Tested negative     DVT PROPHYLAXIS: SubQ lovenox   GI PROPHYLAXIS:  None  ADMISSION STATUS: Inpatient     CODE STATUS: Full Code Status History    Date Active Date Inactive Code Status Order ID Comments User Context   05/25/2018 2009 05/28/2018 2041 Full Code [id number]  Nicholes Mango, MD Inpatient   03/02/2018 1555 03/02/2018 2004 Full Code [id number]  [name], MD Inpatient   11/10/2017 2012 11/12/2017 1551 Full Code [id number]  Hillary Bow, MD ED   08/16/2017 1230 08/17/2017 1631 DNR [id number]  Hillary Bow, MD Inpatient   08/16/2017 0253 08/16/2017 1230 Full Code [id number]  Lance Coon, MD Inpatient   01/29/2017 1749 01/30/2017 2040 Full Code [id number]  Loletha Grayer, MD ED   06/16/2016 2036 06/18/2016 1749 Full Code [id number]  Epifanio Lesches, MD ED   08/28/2015 1034 08/30/2015 2023 Full Code [id number]  Dustin Flock, MD ED   06/03/2015 2238 06/05/2015 2044 Full Code [id number]  Brand Males, MD Inpatient   04/14/2015 1724 04/15/2015 2006 Full Code [id number]  Dustin Flock, MD Inpatient   Advance Care Planning Activity      TOTAL TIME TAKING CARE OF THIS PATIENT: 45 minutes.   This patient was evaluated in the context of the global COVID-19 pandemic, which necessitated consideration that the patient might be at risk for infection with the SARS-CoV-2 virus that causes COVID-19. Institutional protocols and algorithms that pertain to the evaluation of patients at risk for COVID-19 are in a state of rapid change based on information released by regulatory  bodies including the CDC  and federal and state organizations. These policies and algorithms were followed to the best of this provider's knowledge to date during the patient's care at this facility.  Ethlyn Daniels 01/17/2019, 10:33 PM  [facility]  Office  [phone]  CC: Primary care physician; Herminio Commons, MD  Note:  This document was prepared using Dragon voice recognition software and may include unintentional dictation errors.

## 2019-01-17 NOTE — ED Notes (Signed)
Redressed IV catheter

## 2019-01-17 NOTE — ED Notes (Signed)
Patient is feeling as though her heart is "racing", HR 76, albuterol treatment on. Checked with MD, OK to pause treatment at this time and wait on atrovent

## 2019-01-17 NOTE — ED Notes (Signed)
Pt states fever of 101 at home, took tylenol at 1500

## 2019-01-17 NOTE — ED Provider Notes (Signed)
[facility] Emergency Department Provider Note  ____________________________________________   First MD Initiated Contact with Patient 01/17/19 1709     (approximate)  I have reviewed the triage vital signs and the nursing notes.   HISTORY  Chief Complaint Shortness of Breath    HPI Tina Hayes is a 73 y.o. female with extensive past medical history including heart failure with preserved ejection fraction, COPD, CAD, hypertension, diabetes, epilepsy, here with fever and cough with shortness of breath.  The patient states that over the last 3 days, she has felt progressively worsening fever, chills, up to 101 at home.  She been taking Tylenol with intermittent improvement, but her temperature then returns.  She is had associated increasing cough, shortness of breath, and wheezing.  She has felt short of breath just walking around the house.  She states that she has not had any new chest pain.  Her cough is been nonproductive.  No known COVID-19 exposures.  No other complaints.  No abdominal pain, but has had some nausea as well as decreased appetite due to loss of taste of sense and smell.        Past Medical History:  Diagnosis Date  . (HFpEF) heart failure with preserved ejection fraction ([place])    a. 05/2016 Echo: EF 60-65%, mild to mod LVH, Gr1 DD, mild MR, mildly dil LA, mod TR, mildly to mod increased PASP.  [name] Acute diastolic heart failure ([place]) 01/27/2017  . Anxiety   . Chest pain 06/16/2016  . CHF (congestive heart failure) ([place])   . Chronic back pain   . Chronic diastolic congestive heart failure ([place]) 02/13/2017  . COPD (chronic obstructive pulmonary disease) ([place])   . Coronary artery disease    a. s/p remote PCI x 5;  b. 2006 s/p CABG x 3 ([place]); b. 05/2016 MV: attenuation corrected images w/o ischemia or wma-->Med rx.  . Coronary artery disease of native artery of native heart with stable angina pectoris ([place])  06/17/2016  . Depression   . Diabetes mellitus without complication ([place])   . Essential hypertension 06/30/2016  . Heart attack ([place])    Total of 3 per pt.  . Hypertension   . Hypertensive urgency 06/03/2015  . Seizure ([place])   . Seizures [facility])     Patient Active Problem List   Diagnosis Date Noted  . Occlusion and stenosis of vertebral artery 08/31/2018  . AKI (acute kidney injury) ([place]) 05/25/2018  . Seizures ([place]) 11/10/2017  . Mass of right lower leg 09/11/2017  . Dysphagia 04/23/2017  . Chronic diastolic congestive heart failure ([place]) 02/13/2017  . Acute diastolic heart failure ([place]) 01/27/2017  . Depression 12/11/2016  . Essential hypertension 06/30/2016  . Snoring 06/30/2016  . Diabetes mellitus ([place]) 06/30/2016  . Coronary artery disease of native artery of native heart with stable angina pectoris ([place]) 06/17/2016  . Morbid obesity ([place]) 06/17/2016  . COPD (chronic obstructive pulmonary disease) ([place]) 08/28/2015    Past Surgical History:  Procedure Laterality Date  . ABDOMINAL HYSTERECTOMY    . ANKLE SURGERY Right   . CATARACT EXTRACTION W/ INTRAOCULAR LENS  IMPLANT, BILATERAL    . COLONOSCOPY WITH PROPOFOL N/A 05/04/2017   Procedure: COLONOSCOPY WITH PROPOFOL;  Surgeon: [name], MD;  [facility];  Service: Endoscopy;  Laterality: N/A;  . CORONARY ANGIOPLASTY    . CORONARY ARTERY BYPASS GRAFT     TRIPLE BYPASS  . ESOPHAGOGASTRODUODENOSCOPY (EGD) WITH PROPOFOL N/A 05/04/2017  Procedure: ESOPHAGOGASTRODUODENOSCOPY (EGD) WITH PROPOFOL;  Surgeon: [name], MD;  Location: [facility] ENDOSCOPY;  Service: Endoscopy;  Laterality: N/A;  . EXCISION MASS LOWER EXTREMETIES Right 03/02/2018   Procedure: EXCISION SOFT TISSUE MASS FROM MEDIAL ASPECT OF RIGHT ANKLE;  Surgeon: [name], MD;  Location: [facility] ORS;  Service: Orthopedics;  Laterality: Right;  . FRACTURE SURGERY    . KNEE SURGERY Right   . MOUTH SURGERY Left 07/14/2017    Prior to Admission  medications   Medication Sig Start Date End Date Taking? Authorizing Provider  albuterol (PROVENTIL HFA;VENTOLIN HFA) 108 (90 Base) MCG/ACT inhaler Inhale 2 puffs into the lungs every 6 (six) hours as needed for wheezing or shortness of breath. 08/30/15  Yes Epifanio Lesches, MD  albuterol (PROVENTIL) (2.5 MG/3ML) 0.083% nebulizer solution Take 2.5 mg every 4 (four) hours as needed by nebulization for wheezing or shortness of breath.   Yes [provider]  amLODipine (NORVASC) 10 MG tablet Take 1 tablet (10 mg total) by mouth daily. 08/17/18  Yes Minna Merritts, MD  atorvastatin (LIPITOR) 40 MG tablet Take 1 tablet (40 mg total) by mouth at bedtime. 08/17/18  Yes Gollan, Kathlene November, MD  Calcium Carbonate-Vitamin D3 (CALCIUM 600-D) 600-400 MG-UNIT TABS Take 1 tablet by mouth daily.    Yes [provider]  carvedilol (COREG) 3.125 MG tablet Take 1 tablet (3.125 mg total) by mouth 2 (two) times daily with a meal. 08/17/18  Yes Gollan, Kathlene November, MD  cetirizine (ZYRTEC) 10 MG tablet Take 10 mg by mouth daily.   Yes [provider]  clopidogrel (PLAVIX) 75 MG tablet Take 1 tablet (75 mg total) by mouth daily. 08/17/18  Yes Gollan, Kathlene November, MD  COMBIVENT RESPIMAT 20-100 MCG/ACT AERS respimat INHALE 1 PUFF BY MOUTH 4 TIMES DAILY AS NEEDED FOR WHEEZING 07/30/18  Yes [provider]  famotidine (PEPCID) 20 MG tablet Take 20 mg by mouth at bedtime.   Yes [provider]  HYDROcodone-acetaminophen (NORCO) 7.5-325 MG tablet Take 1 tablet by mouth 3 (three) times daily. 01/06/19 02/05/19 Yes Molli Barrows, MD  insulin aspart (NOVOLOG) 100 UNIT/ML injection Inject 10 Units into the skin 3 (three) times daily.    Yes [provider]  insulin glargine (LANTUS) 100 UNIT/ML injection Inject 30-40 Units into the skin See admin instructions. Inject 30 units in the morning at 40 units at bedtime.   Yes [provider]  isosorbide mononitrate (IMDUR) 30 MG 24  hr tablet Take 1 tablet (30 mg total) by mouth 2 (two) times daily. 08/17/18  Yes Minna Merritts, MD  levETIRAcetam (KEPPRA) 750 MG tablet Take 750 mg by mouth 2 (two) times daily.   Yes [provider]  losartan (COZAAR) 100 MG tablet Take 1 tablet (100 mg total) by mouth daily. 08/17/18  Yes Gollan, Kathlene November, MD  meloxicam (MOBIC) 7.5 MG tablet Take 7.5 mg by mouth daily as needed.    Yes [provider]  montelukast (SINGULAIR) 10 MG tablet Take 10 mg by mouth daily.  11/05/17  Yes [provider]  nitroGLYCERIN (NITROSTAT) 0.4 MG SL tablet DISSOLVE ONE TABLET UNDER THE TONGUE EVERY 5 MINUTES AS NEEDED FOR CHEST PAIN.  DO NOT EXCEED A TOTAL OF 3 DOSES IN 15 MINUTES 07/09/18  Yes Gollan, Kathlene November, MD  potassium chloride SA (K-DUR) 20 MEQ tablet Take 2 tablets (40 mEq total) by mouth 2 (two) times daily. Take extra 2 tablets (40 meq) if you take metolazone  01/05/19  Yes Hackney, Otila Kluver A, FNP  sertraline (ZOLOFT) 50 MG tablet Take 50 mg by mouth daily.   Yes [provider]  tiotropium (SPIRIVA) 18 MCG inhalation capsule Place 18 mcg into inhaler and inhale daily.   Yes [provider]  torsemide (DEMADEX) 20 MG tablet Take 3 tablets (60 mg total) by mouth 2 (two) times daily for 30 days. 08/17/18 01/17/19 Yes Gollan, Kathlene November, MD  traZODone (DESYREL) 50 MG tablet Take 50 mg by mouth at bedtime.   Yes [provider]  B-D UF III MINI PEN NEEDLES 31G X 5 MM MISC USE WITH INSULIN PEN INJECTIONS TWICE DAILY 08/05/18   [provider]    Allergies Aspirin  Family History  Problem Relation Age of Onset  . Diabetes Other   . Hypertension Other   . Diabetes Mother   . Heart failure Mother   . Heart disease Mother   . Heart attack Mother   . Stroke Mother   . Depression Mother   . Hypertension Mother   . Cancer Sister        brain  . Hypertension Sister   . Diabetes Brother   . Hypertension Brother   . Heart failure Sister   . Heart  attack Sister   . SIDS Sister     Social History Social History   Tobacco Use  . Smoking status: Former Smoker    Packs/day: 0.25    Years: 20.00    Pack years: 5.00    Types: Cigarettes    Quit date: 07/01/2006    Years since quitting: 12.5  . Smokeless tobacco: Never Used  Substance Use Topics  . Alcohol use: No    Alcohol/week: 0.0 standard drinks  . Drug use: No    Review of Systems  Review of Systems  Constitutional: Positive for fatigue. Negative for fever.  HENT: Positive for congestion and rhinorrhea. Negative for sore throat.   Eyes: Negative for visual disturbance.  Respiratory: Positive for cough and shortness of breath.   Cardiovascular: Negative for chest pain.  Gastrointestinal: Positive for nausea. Negative for abdominal pain, diarrhea and vomiting.  Genitourinary: Negative for flank pain.  Musculoskeletal: Negative for back pain and neck pain.  Skin: Negative for rash and wound.  Neurological: Positive for weakness.     ____________________________________________  PHYSICAL EXAM:      VITAL SIGNS: ED Triage Vitals  Enc Vitals Group     BP 01/17/19 1705 (!) 123/94     Pulse Rate 01/17/19 1705 67     Resp 01/17/19 1705 (!) 24     Temp 01/17/19 1705 99.1 F (37.3 C)     Temp Source 01/17/19 1705 Oral     SpO2 01/17/19 1705 98 %     Weight --      Height --      Head Circumference --      Peak Flow --      Pain Score 01/17/19 1706 4     Pain Loc --      Pain Edu? --      Excl. in [place]? --      Physical Exam Vitals signs and nursing note reviewed.  Constitutional:      General: She is not in acute distress.    Appearance: She is well-developed.  HENT:     Head: Normocephalic and atraumatic.     Mouth/Throat:     Comments: Dry Eyes:     Conjunctiva/sclera: Conjunctivae normal.  Neck:  Musculoskeletal: Neck supple.  Cardiovascular:     Rate and Rhythm: Regular rhythm.     Heart sounds: Normal heart sounds.  Pulmonary:     Effort:  Pulmonary effort is normal. Tachypnea present. No respiratory distress.     Breath sounds: Decreased breath sounds and wheezing present.  Abdominal:     General: There is no distension.  Musculoskeletal:     Right lower leg: No edema.     Left lower leg: No edema.  Skin:    General: Skin is warm.     Capillary Refill: Capillary refill takes less than 2 seconds.     Findings: No rash.  Neurological:     Mental Status: She is alert and oriented to person, place, and time.     Motor: No abnormal muscle tone.       ____________________________________________   LABS (all labs ordered are listed, but only abnormal results are displayed)  Labs Reviewed  CBC WITH DIFFERENTIAL/PLATELET - Abnormal; Notable for the following components:      Result Value   Hemoglobin 10.9 (*)    HCT 35.2 (*)    RDW 15.9 (*)    All other components within normal limits  COMPREHENSIVE METABOLIC PANEL - Abnormal; Notable for the following components:   Glucose, Bld 164 (*)    Creatinine, Ser 1.09 (*)    Alkaline Phosphatase 166 (*)    GFR calc non Af Amer 51 (*)    GFR calc Af Amer 59 (*)    All other components within normal limits  BRAIN NATRIURETIC PEPTIDE - Abnormal; Notable for the following components:   B Natriuretic Peptide 120.0 (*)    All other components within normal limits  SARS CORONAVIRUS 2 (HOSPITAL ORDER, [facility])  LACTIC ACID, PLASMA  PROCALCITONIN    ____________________________________________  EKG: Sinus bradycardia, rate 52.  QRS 98, QTc 435.  No acute ST or T-segment changes.  No evidence of acute ischemia or infarct. ________________________________________  RADIOLOGY All imaging, including plain films, CT scans, and ultrasounds, independently reviewed by me, and interpretations confirmed via formal radiology reads.  ED MD interpretation:     Official radiology report(s): Dg Chest Portable 1 View  Result Date: 01/17/2019 CLINICAL  DATA:  Shortness of breath EXAM: PORTABLE CHEST 1 VIEW COMPARISON:  May 25, 2018 FINDINGS: There is mild scarring in the left base. Lungs elsewhere are clear. Heart is borderline enlarged with pulmonary vascularity normal. No adenopathy. There is a focal hiatal hernia. No bone lesions. IMPRESSION: Scarring left base. No edema or consolidation. Borderline cardiac enlargement. Focal hiatal hernia. Status post coronary artery bypass grafting. Electronically Signed   By: Lowella Grip III M.D.   On: 01/17/2019 18:03    ____________________________________________  PROCEDURES   Procedure(s) performed (including Critical Care):  .Critical Care Performed by: Duffy Bruce, MD Authorized by: Duffy Bruce, MD   Critical care provider statement:    Critical care time (minutes):  35   Critical care time was exclusive of:  Separately billable procedures and treating other patients and teaching time   Critical care was necessary to treat or prevent imminent or life-threatening deterioration of the following conditions:  Cardiac failure, circulatory failure and respiratory failure   Critical care was time spent personally by me on the following activities:  Development of treatment plan with patient or surrogate, discussions with consultants, evaluation of patient's response to treatment, examination of patient, obtaining history from patient or surrogate, ordering and performing treatments and  interventions, ordering and review of laboratory studies, ordering and review of radiographic studies, pulse oximetry, re-evaluation of patient's condition and review of old charts   I assumed direction of critical care for this patient from another provider in my specialty: no      ____________________________________________  INITIAL IMPRESSION / MDM / [place] / ED COURSE  As part of my medical decision making, I reviewed the following data within the electronic MEDICAL RECORD NUMBER Notes  from prior ED visits and [place] Controlled Substance Database      *Tina Hayes was evaluated in Emergency Department on 01/17/2019 for the symptoms described in the history of present illness. She was evaluated in the context of the global COVID-19 pandemic, which necessitated consideration that the patient might be at risk for infection with the SARS-CoV-2 virus that causes COVID-19. Institutional protocols and algorithms that pertain to the evaluation of patients at risk for COVID-19 are in a state of rapid change based on information released by regulatory bodies including the CDC and federal and state organizations. These policies and algorithms were followed during the patient's care in the ED.  Some ED evaluations and interventions may be delayed as a result of limited staffing during the pandemic.*      Medical Decision Making: 73 year old female with extensive past medical history here with cough, shortness of breath, and intermittent fevers.  Patient with diffuse wheezing and increased work of breathing on exam, mild hypoxia.  I suspect most of this is COPD/URI, and chest x-ray shows no pneumonia.  Pelvic negative.  BNP only minimally elevated.  Patient with persistent wheezing and increased work of breathing after nebs, will place on continuous and plan to admit.  IV Solu-Medrol given. Procal neg, no sputum production, doubt sepsis/bacterial infection.  ____________________________________________  FINAL CLINICAL IMPRESSION(S) / ED DIAGNOSES  Final diagnoses:  COPD exacerbation ([place])     MEDICATIONS GIVEN DURING THIS VISIT:  Medications  albuterol (PROVENTIL,VENTOLIN) solution continuous neb (has no administration in time range)  ipratropium (ATROVENT) nebulizer solution 1 mg (has no administration in time range)  0.9 %  sodium chloride infusion ( Intravenous New Bag/Given 01/17/19 1747)  methylPREDNISolone sodium succinate (SOLU-MEDROL) 125 mg/2 mL injection 125 mg (125 mg  Intravenous Given 01/17/19 1745)  albuterol (VENTOLIN HFA) 108 (90 Base) MCG/ACT inhaler 6 puff (6 puffs Inhalation Given 01/17/19 1833)  ipratropium-albuterol (DUONEB) 0.5-2.5 (3) MG/3ML nebulizer solution 3 mL (3 mLs Nebulization Given 01/17/19 1923)     ED Discharge Orders    None       Note:  This document was prepared using Dragon voice recognition software and may include unintentional dictation errors.   Duffy Bruce, MD 01/17/19 2009

## 2019-01-17 NOTE — ED Notes (Signed)
Called pharmacy to verify admission meds

## 2019-01-17 NOTE — ED Triage Notes (Signed)
Pt via EMS from home with c/o SOB, xh of COPD. VSS per EMS. PT in NAD

## 2019-01-18 ENCOUNTER — Other Ambulatory Visit: Payer: Self-pay

## 2019-01-18 DIAGNOSIS — R0602 Shortness of breath: Secondary | ICD-10-CM | POA: Diagnosis not present

## 2019-01-18 DIAGNOSIS — J441 Chronic obstructive pulmonary disease with (acute) exacerbation: Secondary | ICD-10-CM | POA: Diagnosis not present

## 2019-01-18 LAB — BASIC METABOLIC PANEL
Anion gap: 13 (ref 5–15)
BUN: 19 mg/dL (ref 8–23)
CO2: 22 mmol/L (ref 22–32)
Calcium: 9.1 mg/dL (ref 8.9–10.3)
Chloride: 105 mmol/L (ref 98–111)
Creatinine, Ser: 1.09 mg/dL — ABNORMAL HIGH (ref 0.44–1.00)
GFR calc Af Amer: 59 mL/min — ABNORMAL LOW (ref >60)
GFR calc non Af Amer: 51 mL/min — ABNORMAL LOW (ref >60)
Glucose, Bld: 342 mg/dL — ABNORMAL HIGH (ref 70–99)
Potassium: 4.3 mmol/L (ref 3.5–5.1)
Sodium: 140 mmol/L (ref 135–145)

## 2019-01-18 LAB — CBC
HCT: 32.5 % — ABNORMAL LOW (ref 36.0–46.0)
Hemoglobin: 10 g/dL — ABNORMAL LOW (ref 12.0–15.0)
MCH: 26.6 pg (ref 26.0–34.0)
MCHC: 30.8 g/dL (ref 30.0–36.0)
MCV: 86.4 fL (ref 80.0–100.0)
Platelets: 218 10*3/uL (ref 150–400)
RBC: 3.76 MIL/uL — ABNORMAL LOW (ref 3.87–5.11)
RDW: 15.9 % — ABNORMAL HIGH (ref 11.5–15.5)
WBC: 5.2 10*3/uL (ref 4.0–10.5)
nRBC: 0 % (ref 0.0–0.2)

## 2019-01-18 LAB — GLUCOSE, CAPILLARY
Glucose-Capillary: 300 mg/dL — ABNORMAL HIGH (ref 70–99)
Glucose-Capillary: 375 mg/dL — ABNORMAL HIGH (ref 70–99)
Glucose-Capillary: 308 mg/dL — ABNORMAL HIGH (ref 70–99)
Glucose-Capillary: 254 mg/dL — ABNORMAL HIGH (ref 70–99)

## 2019-01-18 MED ORDER — CARVEDILOL 3.125 MG PO TABS
3.1250 mg | ORAL_TABLET | Freq: Two times a day (BID) | ORAL | Status: DC
  Administered 2019-01-18 – 2019-01-19 (×3): 3.125 mg via ORAL
  Filled 2019-01-18 (×4): qty 1

## 2019-01-18 MED ORDER — INSULIN GLARGINE 100 UNIT/ML ~~LOC~~ SOLN
20.0000 [IU] | Freq: Two times a day (BID) | SUBCUTANEOUS | Status: DC
  Administered 2019-01-18 – 2019-01-19 (×3): 20 [IU] via SUBCUTANEOUS
  Filled 2019-01-18 (×4): qty 0.2

## 2019-01-18 MED ORDER — GUAIFENESIN-DM 100-10 MG/5ML PO SYRP: 5.0000 mL | ORAL_SOLUTION | ORAL | Status: DC | PRN

## 2019-01-18 MED ORDER — IPRATROPIUM-ALBUTEROL 0.5-2.5 (3) MG/3ML IN SOLN
3.0000 mL | Freq: Three times a day (TID) | RESPIRATORY_TRACT | Status: DC
  Administered 2019-01-18 – 2019-01-19 (×2): 3 mL via RESPIRATORY_TRACT
  Filled 2019-01-18 (×3): qty 3

## 2019-01-18 MED ORDER — ONDANSETRON HCL 4 MG/2ML IJ SOLN: 4.0000 mg | Freq: Four times a day (QID) | INTRAMUSCULAR | Status: DC | PRN

## 2019-01-18 MED ORDER — METHYLPREDNISOLONE SODIUM SUCC 125 MG IJ SOLR
60.0000 mg | Freq: Two times a day (BID) | INTRAMUSCULAR | Status: DC
  Administered 2019-01-18 – 2019-01-19 (×2): 60 mg via INTRAVENOUS
  Filled 2019-01-18 (×2): qty 2

## 2019-01-18 MED ORDER — BUDESONIDE 0.5 MG/2ML IN SUSP
0.5000 mg | Freq: Two times a day (BID) | RESPIRATORY_TRACT | Status: DC
  Administered 2019-01-18 – 2019-01-19 (×2): 0.5 mg via RESPIRATORY_TRACT
  Filled 2019-01-18 (×2): qty 2

## 2019-01-18 MED ORDER — CLOPIDOGREL BISULFATE 75 MG PO TABS
75.0000 mg | ORAL_TABLET | Freq: Every day | ORAL | Status: DC
  Administered 2019-01-18 – 2019-01-19 (×2): 75 mg via ORAL
  Filled 2019-01-18 (×2): qty 1

## 2019-01-18 MED ORDER — IPRATROPIUM-ALBUTEROL 0.5-2.5 (3) MG/3ML IN SOLN: 3.0000 mL | RESPIRATORY_TRACT | Status: DC | PRN

## 2019-01-18 MED ORDER — ISOSORBIDE MONONITRATE ER 30 MG PO TB24
30.0000 mg | ORAL_TABLET | Freq: Two times a day (BID) | ORAL | Status: DC
  Administered 2019-01-18 – 2019-01-19 (×4): 30 mg via ORAL
  Filled 2019-01-18 (×4): qty 1

## 2019-01-18 MED ORDER — FAMOTIDINE 20 MG PO TABS
20.0000 mg | ORAL_TABLET | Freq: Every day | ORAL | Status: DC
  Administered 2019-01-18 (×2): 20 mg via ORAL
  Filled 2019-01-18 (×2): qty 1

## 2019-01-18 MED ORDER — SERTRALINE HCL 50 MG PO TABS
50.0000 mg | ORAL_TABLET | Freq: Every day | ORAL | Status: DC
  Administered 2019-01-18 – 2019-01-19 (×2): 50 mg via ORAL
  Filled 2019-01-18 (×2): qty 1

## 2019-01-18 MED ORDER — TIOTROPIUM BROMIDE MONOHYDRATE 18 MCG IN CAPS
18.0000 ug | ORAL_CAPSULE | Freq: Every day | RESPIRATORY_TRACT | Status: DC
  Administered 2019-01-18: 18 ug via RESPIRATORY_TRACT
  Filled 2019-01-18: qty 5

## 2019-01-18 MED ORDER — MONTELUKAST SODIUM 10 MG PO TABS
10.0000 mg | ORAL_TABLET | Freq: Every day | ORAL | Status: DC
  Administered 2019-01-18 – 2019-01-19 (×2): 10 mg via ORAL
  Filled 2019-01-18 (×2): qty 1

## 2019-01-18 MED ORDER — ATORVASTATIN CALCIUM 20 MG PO TABS
40.0000 mg | ORAL_TABLET | Freq: Every day | ORAL | Status: DC
  Administered 2019-01-18 (×2): 40 mg via ORAL
  Filled 2019-01-18 (×2): qty 2

## 2019-01-18 MED ORDER — AMLODIPINE BESYLATE 10 MG PO TABS
10.0000 mg | ORAL_TABLET | Freq: Every day | ORAL | Status: DC
  Administered 2019-01-18 – 2019-01-19 (×2): 10 mg via ORAL
  Filled 2019-01-18 (×2): qty 1

## 2019-01-18 MED ORDER — BENZONATATE 100 MG PO CAPS: 200.0000 mg | ORAL_CAPSULE | Freq: Three times a day (TID) | ORAL | Status: DC | PRN

## 2019-01-18 MED ORDER — INFLUENZA VAC A&B SA ADJ QUAD 0.5 ML IM PRSY
0.5000 mL | PREFILLED_SYRINGE | INTRAMUSCULAR | Status: AC
  Administered 2019-01-19: 13:00:00 0.5 mL via INTRAMUSCULAR
  Filled 2019-01-18 (×3): qty 0.5

## 2019-01-18 MED ORDER — ENOXAPARIN SODIUM 40 MG/0.4ML ~~LOC~~ SOLN
40.0000 mg | Freq: Two times a day (BID) | SUBCUTANEOUS | Status: DC
  Administered 2019-01-18 – 2019-01-19 (×2): 40 mg via SUBCUTANEOUS
  Filled 2019-01-18 (×2): qty 0.4

## 2019-01-18 MED ORDER — TORSEMIDE 20 MG PO TABS
60.0000 mg | ORAL_TABLET | Freq: Two times a day (BID) | ORAL | Status: DC
  Administered 2019-01-18 – 2019-01-19 (×3): 60 mg via ORAL
  Filled 2019-01-18 (×3): qty 3

## 2019-01-18 MED ORDER — INSULIN ASPART 100 UNIT/ML ~~LOC~~ SOLN
0.0000 [IU] | Freq: Three times a day (TID) | SUBCUTANEOUS | Status: DC
  Administered 2019-01-18 (×2): 8 [IU] via SUBCUTANEOUS
  Administered 2019-01-18: 18:00:00 11 [IU] via SUBCUTANEOUS
  Administered 2019-01-18: 13:00:00 15 [IU] via SUBCUTANEOUS
  Administered 2019-01-19: 11 [IU] via SUBCUTANEOUS
  Administered 2019-01-19: 5 [IU] via SUBCUTANEOUS
  Filled 2019-01-18 (×6): qty 1

## 2019-01-18 MED ORDER — METHYLPREDNISOLONE SODIUM SUCC 125 MG IJ SOLR
60.0000 mg | Freq: Four times a day (QID) | INTRAMUSCULAR | Status: DC
  Administered 2019-01-18 (×2): 60 mg via INTRAVENOUS
  Filled 2019-01-18 (×2): qty 2

## 2019-01-18 MED ORDER — ACETAMINOPHEN 650 MG RE SUPP: 650.0000 mg | Freq: Four times a day (QID) | RECTAL | Status: DC | PRN

## 2019-01-18 MED ORDER — IPRATROPIUM-ALBUTEROL 0.5-2.5 (3) MG/3ML IN SOLN
3.0000 mL | Freq: Four times a day (QID) | RESPIRATORY_TRACT | Status: DC
  Administered 2019-01-18: 3 mL via RESPIRATORY_TRACT
  Filled 2019-01-18: qty 3

## 2019-01-18 MED ORDER — ACETAMINOPHEN 325 MG PO TABS
650.0000 mg | ORAL_TABLET | Freq: Four times a day (QID) | ORAL | Status: DC | PRN
  Administered 2019-01-18: 325 mg via ORAL
  Filled 2019-01-18: qty 2

## 2019-01-18 MED ORDER — INSULIN ASPART 100 UNIT/ML ~~LOC~~ SOLN
10.0000 [IU] | Freq: Three times a day (TID) | SUBCUTANEOUS | Status: DC
  Administered 2019-01-18 – 2019-01-19 (×5): 10 [IU] via SUBCUTANEOUS
  Filled 2019-01-18 (×5): qty 1

## 2019-01-18 MED ORDER — TRAZODONE HCL 50 MG PO TABS
50.0000 mg | ORAL_TABLET | Freq: Every day | ORAL | Status: DC
  Administered 2019-01-18 (×2): 50 mg via ORAL
  Filled 2019-01-18 (×2): qty 1

## 2019-01-18 MED ORDER — LOSARTAN POTASSIUM 50 MG PO TABS
100.0000 mg | ORAL_TABLET | Freq: Every day | ORAL | Status: DC
  Administered 2019-01-18 – 2019-01-19 (×2): 100 mg via ORAL
  Filled 2019-01-18 (×2): qty 2

## 2019-01-18 MED ORDER — ONDANSETRON HCL 4 MG PO TABS: 4.0000 mg | ORAL_TABLET | Freq: Four times a day (QID) | ORAL | Status: DC | PRN

## 2019-01-18 MED ORDER — BUTALBITAL-APAP-CAFFEINE 50-325-40 MG PO TABS: 1.0000 | ORAL_TABLET | ORAL | Status: DC | PRN

## 2019-01-18 MED ORDER — LEVETIRACETAM 750 MG PO TABS
750.0000 mg | ORAL_TABLET | Freq: Two times a day (BID) | ORAL | Status: DC
  Administered 2019-01-18 – 2019-01-19 (×4): 750 mg via ORAL
  Filled 2019-01-18 (×6): qty 1

## 2019-01-18 NOTE — Progress Notes (Signed)
[facility] [place] NAME: Tina Hayes    MR#:  [id number]  DATE OF BIRTH:  11-Aug-1945  SUBJECTIVE:   Patient presented to the hospital due to shortness of breath and noted to have COPD exacerbation.  Shortness of breath has improved since admission.  Patient denies any chest pains, fever, cough or any other associated symptoms presently.  Patient is complaining of a mild migraine headache.  REVIEW OF SYSTEMS:    Review of Systems  Constitutional: Negative for chills and fever.  HENT: Negative for congestion and tinnitus.   Eyes: Negative for blurred vision and double vision.  Respiratory: Negative for cough, shortness of breath and wheezing.   Cardiovascular: Negative for chest pain, orthopnea and PND.  Gastrointestinal: Negative for abdominal pain, diarrhea, nausea and vomiting.  Genitourinary: Negative for dysuria and hematuria.  Neurological: Positive for headaches. Negative for dizziness, sensory change and focal weakness.  All other systems reviewed and are negative.   Nutrition: Heart Healthy/Carb modified Tolerating Diet: Yes Tolerating PT: Await Eval.   DRUG ALLERGIES:   Allergies  Allergen Reactions   Aspirin Anaphylaxis    VITALS:  Blood pressure (!) 148/76, pulse (!) 55, temperature 98.2 F (36.8 C), temperature source Oral, resp. rate 19, height 5\' 11"  (1.803 m), weight (!) 137.3 kg, SpO2 99 %.  PHYSICAL EXAMINATION:   Physical Exam  GENERAL:  73 y.o.-year-old obese patient lying in bed in no acute distress.  EYES: Pupils equal, round, reactive to light and accommodation. No scleral icterus. Extraocular muscles intact.  HEENT: Head atraumatic, normocephalic. Oropharynx and nasopharynx clear.  NECK:  Supple, no jugular venous distention. No thyroid enlargement, no tenderness.  LUNGS: Prolonged inspiratory expiratory phase, end expiratory wheezing bilaterally, no rales, rhonchi.  Negative use of accessory  muscles. CARDIOVASCULAR: S1, S2 normal. No murmurs, rubs, or gallops.  ABDOMEN: Soft, nontender, nondistended. Bowel sounds present. No organomegaly or mass.  EXTREMITIES: No cyanosis, clubbing or edema b/l.    NEUROLOGIC: Cranial nerves II through XII are intact. No focal Motor or sensory deficits b/l. Globally weak.   PSYCHIATRIC: The patient is alert and oriented x 3.  SKIN: No obvious rash, lesion, or ulcer.    LABORATORY PANEL:   CBC Recent Labs  Lab 01/18/19 0525  WBC 5.2  HGB 10.0*  HCT 32.5*  PLT 218   ------------------------------------------------------------------------------------------------------------------  Chemistries  Recent Labs  Lab 01/17/19 1709 01/18/19 0632  NA 140 140  K 3.8 4.3  CL 104 105  CO2 25 22  GLUCOSE 164* 342*  BUN 19 19  CREATININE 1.09* 1.09*  CALCIUM 9.1 9.1  AST 16  --   ALT 14  --   ALKPHOS 166*  --   BILITOT 0.6  --    ------------------------------------------------------------------------------------------------------------------  Cardiac Enzymes No results for input(s): TROPONINI in the last 168 hours. ------------------------------------------------------------------------------------------------------------------  RADIOLOGY:  Dg Chest Portable 1 View  Result Date: 01/17/2019 CLINICAL DATA:  Shortness of breath EXAM: PORTABLE CHEST 1 VIEW COMPARISON:  May 25, 2018 FINDINGS: There is mild scarring in the left base. Lungs elsewhere are clear. Heart is borderline enlarged with pulmonary vascularity normal. No adenopathy. There is a focal hiatal hernia. No bone lesions. IMPRESSION: Scarring left base. No edema or consolidation. Borderline cardiac enlargement. Focal hiatal hernia. Status post coronary artery bypass grafting. Electronically Signed   By: Lowella Grip III M.D.   On: 01/17/2019 18:03     ASSESSMENT AND PLAN:   73 year old female with  past medical history of COPD, chronic diastolic CHF, depression,  essential hypertension, history of seizures, anxiety, history of seizures who presented to the hospital due to shortness of breath.  1.  COPD exacerbation-source of patient's worsening shortness of breath and hypoxemia. - Chest x-ray on admission was negative for any acute pneumonia.  Improved since yesterday. -We will continue IV steroids with will taper them, continue scheduled duo nebs, Pulmicort nebs. -Assess the patient for home oxygen prior to discharge.  2.  Diabetes type 2 application-blood sugar somewhat elevated due to the IV steroids. -We will continue Lantus, NovoLog with meals and sliding scale insulin.  Follow blood sugars.  3.  History of seizures-no acute seizure activity.  Continue Keppra  4.  Headache-patient has history of migraines. - We will start the patient on some as needed Fioricet.  5.  Hyperlipidemia-continue atorvastatin.  6.  Essential hypertension-continue carvedilol, Norvasc Imdur.  7. Depression - cont. Zoloft.   8. Hx of chronic diastolic CHF - clinically not in CHF.  - cont. Torsemide, Imdur, Coreg.   All the records are reviewed and case discussed with Care Management/Social Worker. Management plans discussed with the patient, family and they are in agreement.  CODE STATUS: Full code  DVT Prophylaxis: Lovenox  TOTAL TIME TAKING CARE OF THIS PATIENT: 30 minutes.   POSSIBLE D/C IN 1-2 DAYS, DEPENDING ON CLINICAL CONDITION.   Henreitta Leber M.D on 01/18/2019 at 1:46 PM  Between 7am to 6pm - Pager - [phone]  After 6pm go to [email or web address] - Proofreader  [employer]  [facility]  Office  [phone]  CC: Primary care physician; Herminio Commons, MD

## 2019-01-18 NOTE — Evaluation (Signed)
Physical Therapy Evaluation Patient Details Name: Tina Hayes MRN: [id number] DOB: [birth year] Today's Date: 01/18/2019   History of Present Illness  Pt is a 73 y.o. female presenting to the hospital 01/17/19 with fever, cough, and SOB.  Pt admitted with COPD with acute exacerbation.  PMH includes CABG, hiatal hernia, CHF, COPD, htn, epilepsy, chronic back pain, h/o R ankle surgery, and h/o R knee surgery.  Clinical Impression  Prior to hospital admission, pt was modified independent ambulating with SPC; no home O2 use.  Pt lives with her family in 1 level home with 3 steps to enter (no railing)--pt always has assist on stairs.  Currently pt is independent with transfers and CGA to SBA with ambulation 140 feet with RW (pt reporting feeling unsteady with SPC so utilized RW to improve balance/stability).  No loss of balance noted with ambulation using RW.  Increased SOB noted with increased distance ambulating (1 short standing rest break mid ambulation to improve breathing); O2 sats 98% or greater on 2 L O2 via nasal cannula during session's activities.  Pt would benefit from skilled PT to address noted impairments and functional limitations (see below for any additional details).  Upon hospital discharge, recommend pt discharge with HHPT.    Follow Up Recommendations Home health PT    Equipment Recommendations  Rolling walker with 5" wheels;3in1 (PT)    Recommendations for Other Services       Precautions / Restrictions Precautions Precautions: Fall Restrictions Weight Bearing Restrictions: No      Mobility  Bed Mobility               General bed mobility comments: Deferred (pt sitting on edge of bed upon PT entering room and sat in chair end of session)  Transfers Overall transfer level: Independent Equipment used: None             General transfer comment: sit to/from stand from bed independent and stand to sit on recliner  independent  Ambulation/Gait Ambulation/Gait assistance: Min Gaffer (Feet): 140 Feet Assistive device: Rolling walker (2 wheeled) Gait Pattern/deviations: Step-through pattern Gait velocity: mildly decreased   General Gait Details: steady with RW; increased SOB with increased distance ambulating  Stairs            Wheelchair Mobility    Modified Rankin (Stroke Patients Only)       Balance Overall balance assessment: Needs assistance Sitting-balance support: No upper extremity supported;Feet supported Sitting balance-Leahy Scale: Normal Sitting balance - Comments: steady sitting putting on B socks   Standing balance support: No upper extremity supported Standing balance-Leahy Scale: Good Standing balance comment: steady standing reaching within BOS                             Pertinent Vitals/Pain Pain Assessment: Faces Pain Location: chronic back pain Pain Descriptors / Indicators: Sore Pain Intervention(s): Limited activity within patient's tolerance;Monitored during session;Repositioned  HR WFL during session's activities.    Home Living Family/patient expects to be discharged to:: Private residence Living Arrangements: Children(Pt's son, son's wife, and their 3 children.) Available Help at Discharge: Family Type of Home: House Home Access: Stairs to enter Entrance Stairs-Rails: None Entrance Stairs-Number of Steps: 3 Home Layout: One level Home Equipment: Cane - single point      Prior Function Level of Independence: Needs assistance   Gait / Transfers Assistance Needed: Ambulates with SPC normally.  Assist on stairs to enter/exit home.  H/o  1 fall in past 6 months d/t seizure.     Comments: No home O2.     Hand Dominance        Extremity/Trunk Assessment   Upper Extremity Assessment Upper Extremity Assessment: Generalized weakness    Lower Extremity Assessment Lower Extremity Assessment: Generalized  weakness    Cervical / Trunk Assessment Cervical / Trunk Assessment: Normal  Communication   Communication: No difficulties  Cognition Arousal/Alertness: Awake/alert Behavior During Therapy: WFL for tasks assessed/performed Overall Cognitive Status: Within Functional Limits for tasks assessed                                        General Comments   Nursing cleared pt for participation in physical therapy.  Pt agreeable to PT session.  Pt was just finishing sponge bath upon PT entering room.    Exercises     Assessment/Plan    PT Assessment Patient needs continued PT services  PT Problem List Decreased strength;Decreased activity tolerance;Decreased balance;Decreased mobility;Cardiopulmonary status limiting activity       PT Treatment Interventions DME instruction;Gait training;Stair training;Functional mobility training;Therapeutic activities;Therapeutic exercise;Balance training;Patient/family education    PT Goals (Current goals can be found in the Care Plan section)  Acute Rehab PT Goals Patient Stated Goal: to go home and be as independent as possible PT Goal Formulation: With patient Time For Goal Achievement: 02/01/19 Potential to Achieve Goals: Good    Frequency Min 2X/week   Barriers to discharge        Co-evaluation               AM-PAC PT "6 Clicks" Mobility  Outcome Measure Help needed turning from your back to your side while in a flat bed without using bedrails?: None Help needed moving from lying on your back to sitting on the side of a flat bed without using bedrails?: None Help needed moving to and from a bed to a chair (including a wheelchair)?: None Help needed standing up from a chair using your arms (e.g., wheelchair or bedside chair)?: None Help needed to walk in hospital room?: A Little Help needed climbing 3-5 steps with a railing? : A Little 6 Click Score: 22    End of Session Equipment Utilized During Treatment:  Gait belt;Oxygen(2 L O2 via nasal cannula) Activity Tolerance: Patient tolerated treatment well Patient left: in chair;with call bell/phone within reach;with chair alarm set Nurse Communication: Mobility status;Precautions PT Visit Diagnosis: Other abnormalities of gait and mobility (R26.89);Muscle weakness (generalized) (M62.81)    Time: [id number] PT Time Calculation (min) (ACUTE ONLY): 26 min   Charges:   PT Evaluation $PT Eval Low Complexity: 1 Low PT Treatments $Therapeutic Exercise: 8-22 mins       Leitha Bleak, PT 01/18/19, 2:05 PM [phone]

## 2019-01-18 NOTE — TOC Initial Note (Signed)
Transition of Care [facility]) - Initial/Assessment Note    Patient Details  Name: Tina Hayes MRN: [id number] Date of Birth: November 28, 1945  Transition of Care [facility]) CM/SW Contact:    Tina Hayes, Tina Hayes Phone Number: [phone]  01/18/2019, 3:51 PM  Clinical Narrative: PT is recommending home health. Clinical Education officer, museum (CSW) met with patient alone at bedside to discuss D/C plan. Patient was alert and oriented X4 and was sitting up on the side of the bed. CSW introduced self and explained role of CSW department. Patient reported that she lives in [place] with her son Tina Hayes, daughter in law and 3 grandchildren. CSW explained home health process. Patient declined home health. Patient reported that she does not get along with her daughter in law and her daughter in law doesn't want anyone in the home. Patient reported that she is not being physically or emotionally abused. Patient reported that her daughter in law does not abuse her however they just don't get along. Patient reported that her son is disabled and stays home with her 24/7. Patient is agreeable to a rolling walker. Patient reported that she has a bedside commode at home. Tina Hayes DME agency representative is aware of above. Patient reported that she uses medicaid transport and her son provides transport. Patient reported that she also has chronic depression. Patient reported that she is not having thoughts of hurting herself or anybody else. Patient reported that she is on depression medication. CSW provided patient with outpatient mental health resources including Tina Hayes and Tina Hayes. Patient accepted resources and reported no other needs or concerns. CSW will continue to follow and assist as needed.            Expected Discharge Plan: Home/Self Care Barriers to Discharge: Continued Medical Work up   Patient Goals and CMS Choice Patient states their goals for this hospitalization and ongoing recovery are:: To go home.       Expected Discharge Plan and Services Expected Discharge Plan: Home/Self Care In-house Referral: Clinical Social Work   Post Acute Care Choice: [place] arrangements for the past 2 months: Single Family Home Expected Discharge Date: 01/20/19               DME Arranged: Gilford Rile rolling DME Agency: [employer] Date DME Agency Contacted: 01/18/19 Time DME Agency Contacted: [phone] Representative spoke with at DME Agency: Tina Hayes HH Arranged: Patient Refused Tina Hayes          Prior Living Arrangements/Services Living arrangements for the past 2 months: [place] Lives with:: Adult Children Patient language and need for interpreter reviewed:: No Do you feel safe going back to the place where you live?: Yes      Need for Family Participation in Patient Care: No (Comment) Care giver support system in place?: Yes (comment) Current home services: DME(Patient has a bedside commode at home.) Criminal Activity/Legal Involvement Pertinent to Current Situation/Hospitalization: No - Comment as needed  Activities of Daily Living Home Assistive Devices/Equipment: Cane (specify quad or straight) ADL Screening (condition at time of admission) Patient's cognitive ability adequate to safely complete daily activities?: Yes Is the patient deaf or have difficulty hearing?: No Does the patient have difficulty seeing, even when wearing glasses/contacts?: No Does the patient have difficulty concentrating, remembering, or making decisions?: No Patient able to express need for assistance with ADLs?: Yes Does the patient have difficulty dressing or bathing?: Yes Independently performs ADLs?: Yes (appropriate for developmental age) Does the patient have difficulty walking or climbing stairs?:  Yes Weakness of Legs: None Weakness of Arms/Hands: None  Permission Sought/Granted Permission sought to share information with : Other (comment)(DME agency) Permission granted to share information with :  Yes, Verbal Permission Granted              Emotional Assessment Appearance:: Appears stated age Attitude/Demeanor/Rapport: Engaged Affect (typically observed): Pleasant, Calm Orientation: : Oriented to Self, Oriented to Place, Oriented to  Time, Oriented to Situation Alcohol / Substance Use: Not Applicable Psych Involvement: No (comment)  Admission diagnosis:  COPD exacerbation ([place]) [J44.1] Patient Active Problem List   Diagnosis Date Noted  . Occlusion and stenosis of vertebral artery 08/31/2018  . AKI (acute kidney injury) ([place]) 05/25/2018  . Seizures ([place]) 11/10/2017  . Mass of right lower leg 09/11/2017  . Dysphagia 04/23/2017  . Chronic diastolic congestive heart failure ([place]) 02/13/2017  . Acute diastolic heart failure ([place]) 01/27/2017  . Depression 12/11/2016  . Essential hypertension 06/30/2016  . Snoring 06/30/2016  . Diabetes mellitus ([place]) 06/30/2016  . Coronary artery disease of native artery of native heart with stable angina pectoris ([place]) 06/17/2016  . Morbid obesity ([place]) 06/17/2016  . COPD (chronic obstructive pulmonary disease) ([place]) 08/28/2015  . COPD with acute exacerbation ([place]) 04/14/2015   PCP:  Herminio Commons, MD Pharmacy:   [facility] [address] [place] [address] [place] ([place] [address] Phone: [phone] Fax: [facility] [place], [place] [address] Phone: [phone] Fax: [phone]     Social Determinants of Health (SDOH) Interventions    Readmission Risk Interventions No flowsheet data found.

## 2019-01-19 DIAGNOSIS — Z23 Encounter for immunization: Secondary | ICD-10-CM | POA: Diagnosis present

## 2019-01-19 DIAGNOSIS — J441 Chronic obstructive pulmonary disease with (acute) exacerbation: Secondary | ICD-10-CM | POA: Diagnosis not present

## 2019-01-19 DIAGNOSIS — R0602 Shortness of breath: Secondary | ICD-10-CM | POA: Diagnosis not present

## 2019-01-19 LAB — HEMOGLOBIN A1C
Hgb A1c MFr Bld: 6.5 % — ABNORMAL HIGH (ref 4.8–5.6)
Mean Plasma Glucose: 140 mg/dL

## 2019-01-19 LAB — GLUCOSE, CAPILLARY
Glucose-Capillary: 213 mg/dL — ABNORMAL HIGH (ref 70–99)
Glucose-Capillary: 350 mg/dL — ABNORMAL HIGH (ref 70–99)

## 2019-01-19 MED ORDER — AZITHROMYCIN 250 MG PO TABS: 250.0000 mg | ORAL_TABLET | Freq: Every day | ORAL | 0 refills | Status: AC

## 2019-01-19 MED ORDER — PREDNISONE 10 MG PO TABS: ORAL_TABLET | ORAL | 0 refills | Status: DC

## 2019-01-19 NOTE — Plan of Care (Signed)
  Problem: Clinical Measurements: Goal: Respiratory complications will improve Outcome: Progressing   Problem: Activity: Goal: Risk for activity intolerance will decrease Outcome: Progressing   Problem: Nutrition: Goal: Adequate nutrition will be maintained Outcome: Progressing   Problem: Coping: Goal: Level of anxiety will decrease Outcome: Progressing   Problem: Elimination: Goal: Will not experience complications related to urinary retention Outcome: Progressing   Problem: Pain Managment: Goal: General experience of comfort will improve Outcome: Progressing   

## 2019-01-19 NOTE — Progress Notes (Signed)
Patient discharged to home.  Tele and IV d/c'd.  Verbalizes understanding of discharge instructions.  Patient wheeled walker delivered prior to discharge.

## 2019-01-19 NOTE — Discharge Summary (Signed)
[facility] NAME: Tina Hayes    MR#:  [id number]  DATE OF BIRTH:  12/20/1945  DATE OF ADMISSION:  01/17/2019 ADMITTING PHYSICIAN: Lance Coon, MD  DATE OF DISCHARGE: 01/19/2019  PRIMARY CARE PHYSICIAN: Soles, Howell Rucks, MD    ADMISSION DIAGNOSIS:  COPD exacerbation ([place]) [J44.1]  DISCHARGE DIAGNOSIS:  Principal Problem:   COPD with acute exacerbation ([place]) Active Problems:   Coronary artery disease of native artery of native heart with stable angina pectoris (HCC)   Essential hypertension   Diabetes mellitus (HCC)   Chronic diastolic congestive heart failure ([place])   Seizures ([place])   SECONDARY DIAGNOSIS:   Past Medical History:  Diagnosis Date  . (HFpEF) heart failure with preserved ejection fraction ([place])    a. 05/2016 Echo: EF 60-65%, mild to mod LVH, Gr1 DD, mild MR, mildly dil LA, mod TR, mildly to mod increased PASP.  [name] Acute diastolic heart failure ([place]) 01/27/2017  . Anxiety   . Chest pain 06/16/2016  . CHF (congestive heart failure) ([place])   . Chronic back pain   . Chronic diastolic congestive heart failure ([place]) 02/13/2017  . COPD (chronic obstructive pulmonary disease) ([place])   . Coronary artery disease    a. s/p remote PCI x 5;  b. 2006 s/p CABG x 3 ([place]); b. 05/2016 MV: attenuation corrected images w/o ischemia or wma-->Med rx.  . Coronary artery disease of native artery of native heart with stable angina pectoris ([place]) 06/17/2016  . Depression   . Diabetes mellitus without complication ([place])   . Essential hypertension 06/30/2016  . Heart attack ([place])    Total of 3 per pt.  . Hypertension   . Hypertensive urgency 06/03/2015  . Seizure ([place])   . Seizures [facility])     HOSPITAL COURSE:   73 year old female with past medical history of COPD, chronic diastolic CHF, depression, essential hypertension, history of seizures, anxiety, history of seizures who presented to the hospital  due to shortness of breath.  1.  COPD exacerbation-source of patient's worsening shortness of breath and hypoxemia. - Chest x-ray on admission was negative for any acute pneumonia.  -Patient was treated with IV steroids, scheduled duo nebs, Pulmicort nebs and also given empiric Zithromax. -Significantly improved since admission and has no wheezing and bronchospasm now.  Patient ambulated and did not qualify for home oxygen.  She is clinically stable therefore being discharged home on prednisone taper and empiric Zithromax. -Patient will continue Combivent and albuterol inhaler as needed.  2.  Diabetes type 2 without complication -blood sugars were somewhat elevated due to the IV steroids.  Diabetes coordinator consulted.  Blood sugars have improved as patient has been maintained on her home Lantus and NovoLog with meals.  Patient will resume the regimen as stated below.  3.  History of seizures-no acute seizure activity.  pt. Will Continue Keppra  4.  Headache-patient has history of migraines. - improved with some Fiorcet.  Stable now.   5.  Hyperlipidemia-continue atorvastatin.  6.  Essential hypertension- pt. Will continue carvedilol, Norvasc Imdur.  7. Depression - pt. Will cont. Zoloft.   8. Hx of chronic diastolic CHF - clinically was not in CHF while in the hospital.  - pt. Will cont  Torsemide, Imdur, Coreg.   DISCHARGE CONDITIONS:   Stable.   CONSULTS OBTAINED:    DRUG ALLERGIES:   Allergies  Allergen Reactions  . Aspirin Anaphylaxis    DISCHARGE MEDICATIONS:  Allergies as of 01/19/2019      Reactions   Aspirin Anaphylaxis      Medication List    TAKE these medications   albuterol (2.5 MG/3ML) 0.083% nebulizer solution Commonly known as: PROVENTIL Take 2.5 mg every 4 (four) hours as needed by nebulization for wheezing or shortness of breath.   albuterol 108 (90 Base) MCG/ACT inhaler Commonly known as: VENTOLIN HFA Inhale 2 puffs into the lungs  every 6 (six) hours as needed for wheezing or shortness of breath.   amLODipine 10 MG tablet Commonly known as: NORVASC Take 1 tablet (10 mg total) by mouth daily.   atorvastatin 40 MG tablet Commonly known as: LIPITOR Take 1 tablet (40 mg total) by mouth at bedtime.   azithromycin 250 MG tablet Commonly known as: Zithromax Take 1 tablet (250 mg total) by mouth daily for 3 days.   B-D UF III MINI PEN NEEDLES 31G X 5 MM Misc Generic drug: Insulin Pen Needle USE WITH INSULIN PEN INJECTIONS TWICE DAILY   Calcium 600-D 600-400 MG-UNIT Tabs Generic drug: Calcium Carbonate-Vitamin D3 Take 1 tablet by mouth daily.   carvedilol 3.125 MG tablet Commonly known as: COREG Take 1 tablet (3.125 mg total) by mouth 2 (two) times daily with a meal.   cetirizine 10 MG tablet Commonly known as: ZYRTEC Take 10 mg by mouth daily.   clopidogrel 75 MG tablet Commonly known as: PLAVIX Take 1 tablet (75 mg total) by mouth daily.   Combivent Respimat 20-100 MCG/ACT Aers respimat Generic drug: Ipratropium-Albuterol INHALE 1 PUFF BY MOUTH 4 TIMES DAILY AS NEEDED FOR WHEEZING   famotidine 20 MG tablet Commonly known as: PEPCID Take 20 mg by mouth at bedtime.   HYDROcodone-acetaminophen 7.5-325 MG tablet Commonly known as: NORCO Take 1 tablet by mouth 3 (three) times daily.   insulin aspart 100 UNIT/ML injection Commonly known as: novoLOG Inject 10 Units into the skin 3 (three) times daily.   insulin glargine 100 UNIT/ML injection Commonly known as: LANTUS Inject 30-40 Units into the skin See admin instructions. Inject 30 units in the morning at 40 units at bedtime.   isosorbide mononitrate 30 MG 24 hr tablet Commonly known as: IMDUR Take 1 tablet (30 mg total) by mouth 2 (two) times daily.   levETIRAcetam 750 MG tablet Commonly known as: KEPPRA Take 750 mg by mouth 2 (two) times daily.   losartan 100 MG tablet Commonly known as: COZAAR Take 1 tablet (100 mg total) by mouth daily.    meloxicam 7.5 MG tablet Commonly known as: MOBIC Take 7.5 mg by mouth daily as needed.   montelukast 10 MG tablet Commonly known as: SINGULAIR Take 10 mg by mouth daily.   nitroGLYCERIN 0.4 MG SL tablet Commonly known as: NITROSTAT DISSOLVE ONE TABLET UNDER THE TONGUE EVERY 5 MINUTES AS NEEDED FOR CHEST PAIN.  DO NOT EXCEED A TOTAL OF 3 DOSES IN 15 MINUTES   potassium chloride SA 20 MEQ tablet Commonly known as: K-DUR Take 2 tablets (40 mEq total) by mouth 2 (two) times daily. Take extra 2 tablets (40 meq) if you take metolazone   predniSONE 10 MG tablet Commonly known as: DELTASONE Label  & dispense according to the schedule below. 5 Pills PO for 1 day then, 4 Pills PO for 1 day, 3 Pills PO for 1 day, 2 Pills PO for 1 day, 1 Pill PO for 1 days then STOP.   sertraline 50 MG tablet Commonly known as: ZOLOFT Take 50 mg by mouth daily.  tiotropium 18 MCG inhalation capsule Commonly known as: SPIRIVA Place 18 mcg into inhaler and inhale daily.   torsemide 20 MG tablet Commonly known as: DEMADEX Take 3 tablets (60 mg total) by mouth 2 (two) times daily for 30 days.   traZODone 50 MG tablet Commonly known as: DESYREL Take 50 mg by mouth at bedtime.            Durable Medical Equipment  (From admission, onward)         Start     Ordered   01/19/19 1002  For home use only DME Walker rolling  Once    Question:  Patient needs a walker to treat with the following condition  Answer:  COPD (chronic obstructive pulmonary disease) ([place])   01/19/19 1001            DISCHARGE INSTRUCTIONS:   DIET:  Cardiac diet and Diabetic diet  DISCHARGE CONDITION:  Stable  ACTIVITY:  Activity as tolerated  OXYGEN:  Home Oxygen: No.   Oxygen Delivery: room air  DISCHARGE LOCATION:  home   If you experience worsening of your admission symptoms, develop shortness of breath, life threatening emergency, suicidal or homicidal thoughts you must seek medical attention  immediately by calling 911 or calling your MD immediately  if symptoms less severe.  You Must read complete instructions/literature along with all the possible adverse reactions/side effects for all the Medicines you take and that have been prescribed to you. Take any new Medicines after you have completely understood and accpet all the possible adverse reactions/side effects.   Please note  You were cared for by a hospitalist during your hospital stay. If you have any questions about your discharge medications or the care you received while you were in the hospital after you are discharged, you can call the unit and asked to speak with the hospitalist on call if the hospitalist that took care of you is not available. Once you are discharged, your primary care physician will handle any further medical issues. Please note that NO REFILLS for any discharge medications will be authorized once you are discharged, as it is imperative that you return to your primary care physician (or establish a relationship with a primary care physician if you do not have one) for your aftercare needs so that they can reassess your need for medications and monitor your lab values.     Today   Shortness of breath and wheezing much improved.  Overall feels much better and will discharge home today.   VITAL SIGNS:  Blood pressure (!) 146/63, pulse (!) 54, temperature 98.8 F (37.1 C), temperature source Oral, resp. rate 18, height 5\' 11"  (1.803 m), weight 135.6 kg, SpO2 97 %.  I/O:    Intake/Output Summary (Last 24 hours) at 01/19/2019 1309 Last data filed at 01/19/2019 0748 Gross per 24 hour  Intake -  Output 1400 ml  Net -1400 ml    PHYSICAL EXAMINATION:   GENERAL:  73 y.o.-year-old obese patient lying in bed in no acute distress.  EYES: Pupils equal, round, reactive to light and accommodation. No scleral icterus. Extraocular muscles intact.  HEENT: Head atraumatic, normocephalic. Oropharynx and  nasopharynx clear.  NECK:  Supple, no jugular venous distention. No thyroid enlargement, no tenderness.  LUNGS: Prolonged inspiratory expiratory phase, minimal expiratory wheezing bilaterally, no rales, rhonchi.  Negative use of accessory muscles. CARDIOVASCULAR: S1, S2 normal. No murmurs, rubs, or gallops.  ABDOMEN: Soft, nontender, nondistended. Bowel sounds present. No organomegaly or mass.  EXTREMITIES: No cyanosis, clubbing or edema b/l.    NEUROLOGIC: Cranial nerves II through XII are intact. No focal Motor or sensory deficits b/l. Globally weak.   PSYCHIATRIC: The patient is alert and oriented x 3.  SKIN: No obvious rash, lesion, or ulcer.    DATA REVIEW:   CBC Recent Labs  Lab 01/18/19 0525  WBC 5.2  HGB 10.0*  HCT 32.5*  PLT 218    Chemistries  Recent Labs  Lab 01/17/19 1709 01/18/19 0632  NA 140 140  K 3.8 4.3  CL 104 105  CO2 25 22  GLUCOSE 164* 342*  BUN 19 19  CREATININE 1.09* 1.09*  CALCIUM 9.1 9.1  AST 16  --   ALT 14  --   ALKPHOS 166*  --   BILITOT 0.6  --     Cardiac Enzymes No results for input(s): TROPONINI in the last 168 hours.  Microbiology Results  Results for orders placed or performed during the hospital encounter of 01/17/19  SARS Coronavirus 2 [facility] order, Performed in [facility] hospital lab) Nasopharyngeal Nasopharyngeal Swab     Status: None   Collection Time: 01/17/19  5:44 PM   Specimen: Nasopharyngeal Swab  Result Value Ref Range Status   SARS Coronavirus 2 NEGATIVE NEGATIVE Final    Comment: (NOTE) If result is NEGATIVE SARS-CoV-2 target nucleic acids are NOT DETECTED. The SARS-CoV-2 RNA is generally detectable in upper and lower  respiratory specimens during the acute phase of infection. The lowest  concentration of SARS-CoV-2 viral copies this assay can detect is 250  copies / mL. A negative result does not preclude SARS-CoV-2 infection  and should not be used as the sole basis for treatment or other  patient  management decisions.  A negative result may occur with  improper specimen collection / handling, submission of specimen other  than nasopharyngeal swab, presence of viral mutation(s) within the  areas targeted by this assay, and inadequate number of viral copies  (<250 copies / mL). A negative result must be combined with clinical  observations, patient history, and epidemiological information. If result is POSITIVE SARS-CoV-2 target nucleic acids are DETECTED. The SARS-CoV-2 RNA is generally detectable in upper and lower  respiratory specimens dur ing the acute phase of infection.  Positive  results are indicative of active infection with SARS-CoV-2.  Clinical  correlation with patient history and other diagnostic information is  necessary to determine patient infection status.  Positive results do  not rule out bacterial infection or co-infection with other viruses. If result is PRESUMPTIVE POSTIVE SARS-CoV-2 nucleic acids MAY BE PRESENT.   A presumptive positive result was obtained on the submitted specimen  and confirmed on repeat testing.  While 2019 novel coronavirus  (SARS-CoV-2) nucleic acids may be present in the submitted sample  additional confirmatory testing may be necessary for epidemiological  and / or clinical management purposes  to differentiate between  SARS-CoV-2 and other Sarbecovirus currently known to infect humans.  If clinically indicated additional testing with an alternate test  methodology [id number]) is advised. The SARS-CoV-2 RNA is generally  detectable in upper and lower respiratory sp ecimens during the acute  phase of infection. The expected result is Negative. Fact Sheet for Patients:  StrictlyIdeas.no Fact Sheet for Healthcare Providers: BankingDealers.co.za This test is not yet approved or cleared by the [place] FDA and has been authorized for detection and/or diagnosis of SARS-CoV-2 by FDA under  an Emergency Use Authorization (EUA).  This EUA will remain in  effect (meaning this test can be used) for the duration of the COVID-19 declaration under Section 564(b)(1) of the Act, 21 U.S.C. section 360bbb-3(b)(1), unless the authorization is terminated or revoked sooner. Performed at [facility], [address] [place],  [address]     RADIOLOGY:  Dg Chest Portable 1 View  Result Date: 01/17/2019 CLINICAL DATA:  Shortness of breath EXAM: PORTABLE CHEST 1 VIEW COMPARISON:  May 25, 2018 FINDINGS: There is mild scarring in the left base. Lungs elsewhere are clear. Heart is borderline enlarged with pulmonary vascularity normal. No adenopathy. There is a focal hiatal hernia. No bone lesions. IMPRESSION: Scarring left base. No edema or consolidation. Borderline cardiac enlargement. Focal hiatal hernia. Status post coronary artery bypass grafting. Electronically Signed   By: Lowella Grip III M.D.   On: 01/17/2019 18:03      Management plans discussed with the patient, family and they are in agreement.  CODE STATUS:     Code Status Orders  (From admission, onward)         Start     Ordered   01/18/19 0029  Full code  Continuous     01/18/19 0029          TOTAL TIME TAKING CARE OF THIS PATIENT: 40 minutes.    Henreitta Leber M.D on 01/19/2019 at 1:09 PM  Between 7am to 6pm - Pager - [phone]  After 6pm go to [email or web address] - Proofreader  [facility]  Office  [phone]  CC: Primary care physician; Herminio Commons, MD

## 2019-01-19 NOTE — Progress Notes (Addendum)
Inpatient Diabetes Program Recommendations  AACE/ADA: New Consensus Statement on Inpatient Glycemic Control (2015)  Target Ranges:  Prepandial:   less than 140 mg/dL      Peak postprandial:   less than 180 mg/dL (1-2 hours)      Critically ill patients:  140 - 180 mg/dL   Lab Results  Component Value Date   GLUCAP 350 (H) 01/19/2019   HGBA1C 6.5 (H) 01/18/2019    Review of Glycemic Control Results for EANNA, KULIG (MRN [id number]) as of 01/19/2019 12:34  Ref. Range 01/18/2019 11:59 01/18/2019 16:09 01/18/2019 20:56 01/19/2019 07:47 01/19/2019 11:29  Glucose-Capillary Latest Ref Range: 70 - 99 mg/dL 375 (H) 308 (H) 254 (H) 213 (H) 350 (H)   Diabetes history: DM2 Outpatient Diabetes medications: Lantus 30 units am + 40 units pm Current orders for Inpatient glycemic control: Lantus 20 units bid + Novolog moderate correction tid  Inpatient Diabetes Program Recommendations:   Spoke with Dr. Verdell Carmine and patient to be discharged this afternoon.  Thank you, Tina Hayes. Letetia Romanello, RN, MSN, CDE  Diabetes Coordinator Inpatient Glycemic Control Team Team Pager [phone] (8am-5pm) 01/19/2019 12:36 PM

## 2019-01-20 ENCOUNTER — Other Ambulatory Visit: Payer: Self-pay

## 2019-01-20 ENCOUNTER — Encounter: Payer: Self-pay | Admitting: Anesthesiology

## 2019-01-20 ENCOUNTER — Ambulatory Visit: Payer: Medicare HMO | Attending: Anesthesiology | Admitting: Anesthesiology

## 2019-01-20 VITALS — BP 114/66 | HR 54 | Temp 98.4°F | Resp 16 | Ht 71.0 in | Wt 300.0 lb

## 2019-01-20 DIAGNOSIS — M48062 Spinal stenosis, lumbar region with neurogenic claudication: Secondary | ICD-10-CM | POA: Diagnosis not present

## 2019-01-20 DIAGNOSIS — M5136 Other intervertebral disc degeneration, lumbar region: Secondary | ICD-10-CM

## 2019-01-20 DIAGNOSIS — M5431 Sciatica, right side: Secondary | ICD-10-CM

## 2019-01-20 DIAGNOSIS — F1190 Opioid use, unspecified, uncomplicated: Secondary | ICD-10-CM

## 2019-01-20 DIAGNOSIS — G894 Chronic pain syndrome: Secondary | ICD-10-CM | POA: Diagnosis not present

## 2019-01-20 DIAGNOSIS — M4716 Other spondylosis with myelopathy, lumbar region: Secondary | ICD-10-CM

## 2019-01-20 DIAGNOSIS — M5432 Sciatica, left side: Secondary | ICD-10-CM

## 2019-01-20 IMAGING — CR DG HUMERUS 2V *L*
2 series · 2 of 2 positions shown · non-contrast
Comparison: None.

CLINICAL DATA: states she developed left upper arm pain about 2-3
days ago.Pain is in entire humerus; she thinks she may have been
stung by something Positive swelling to upper arm Increased pain
with movement.--No known injuryCHF, COPD, CAD, DM, h/o MI (x 3),
HTN, former smoker x 10 years agoNo previous injury to left humerus

EXAM:
LEFT HUMERUS - 2+ VIEW

[humerus ap]
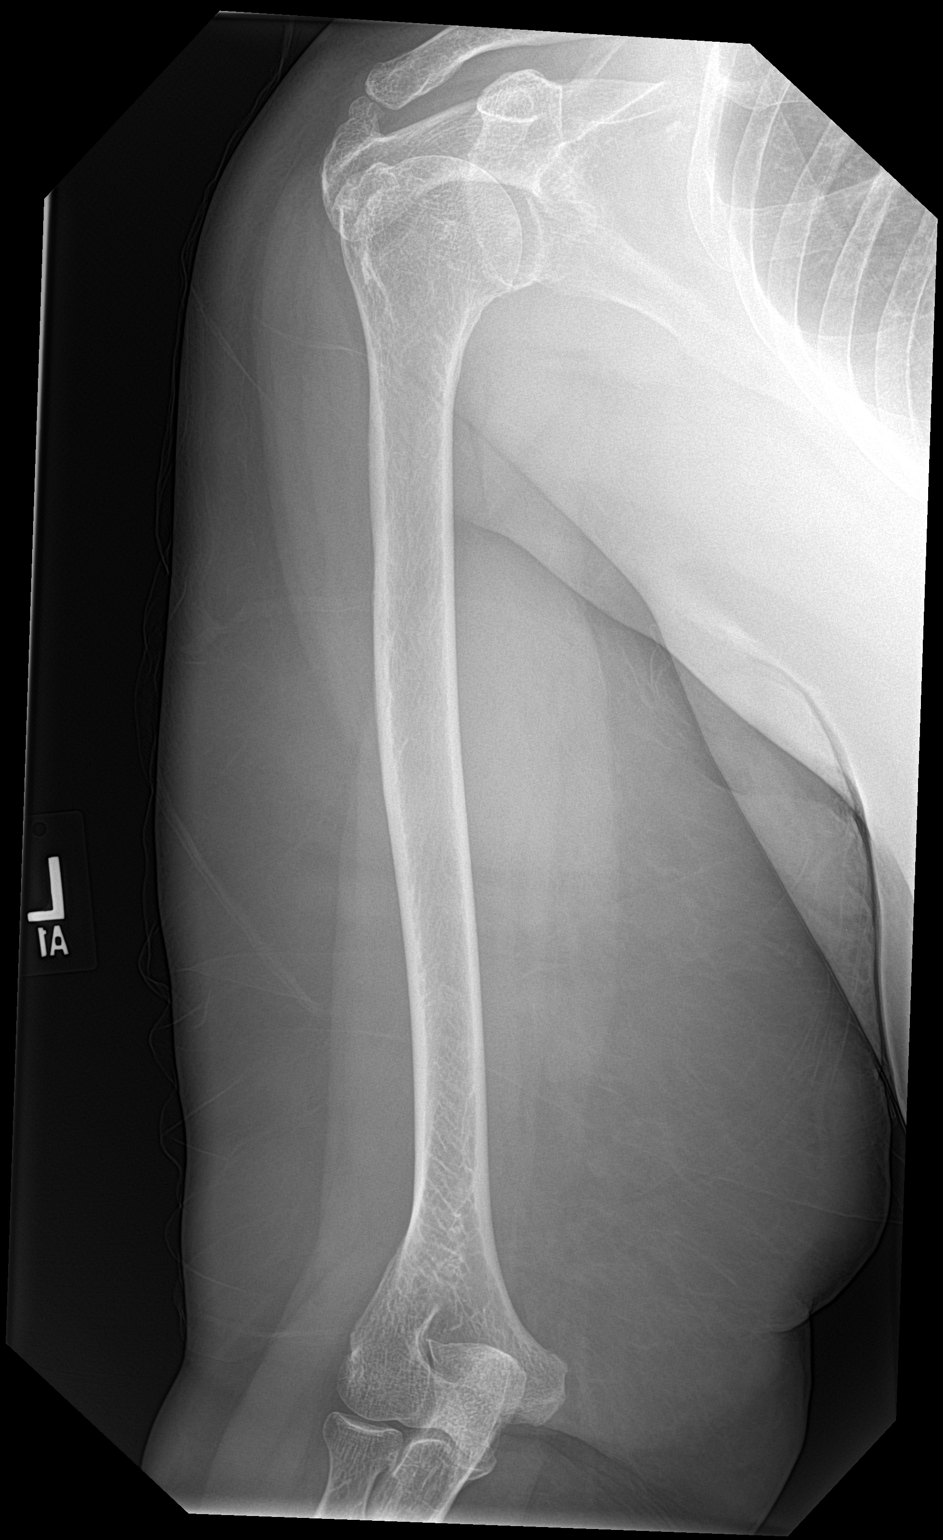

[humerus lat]
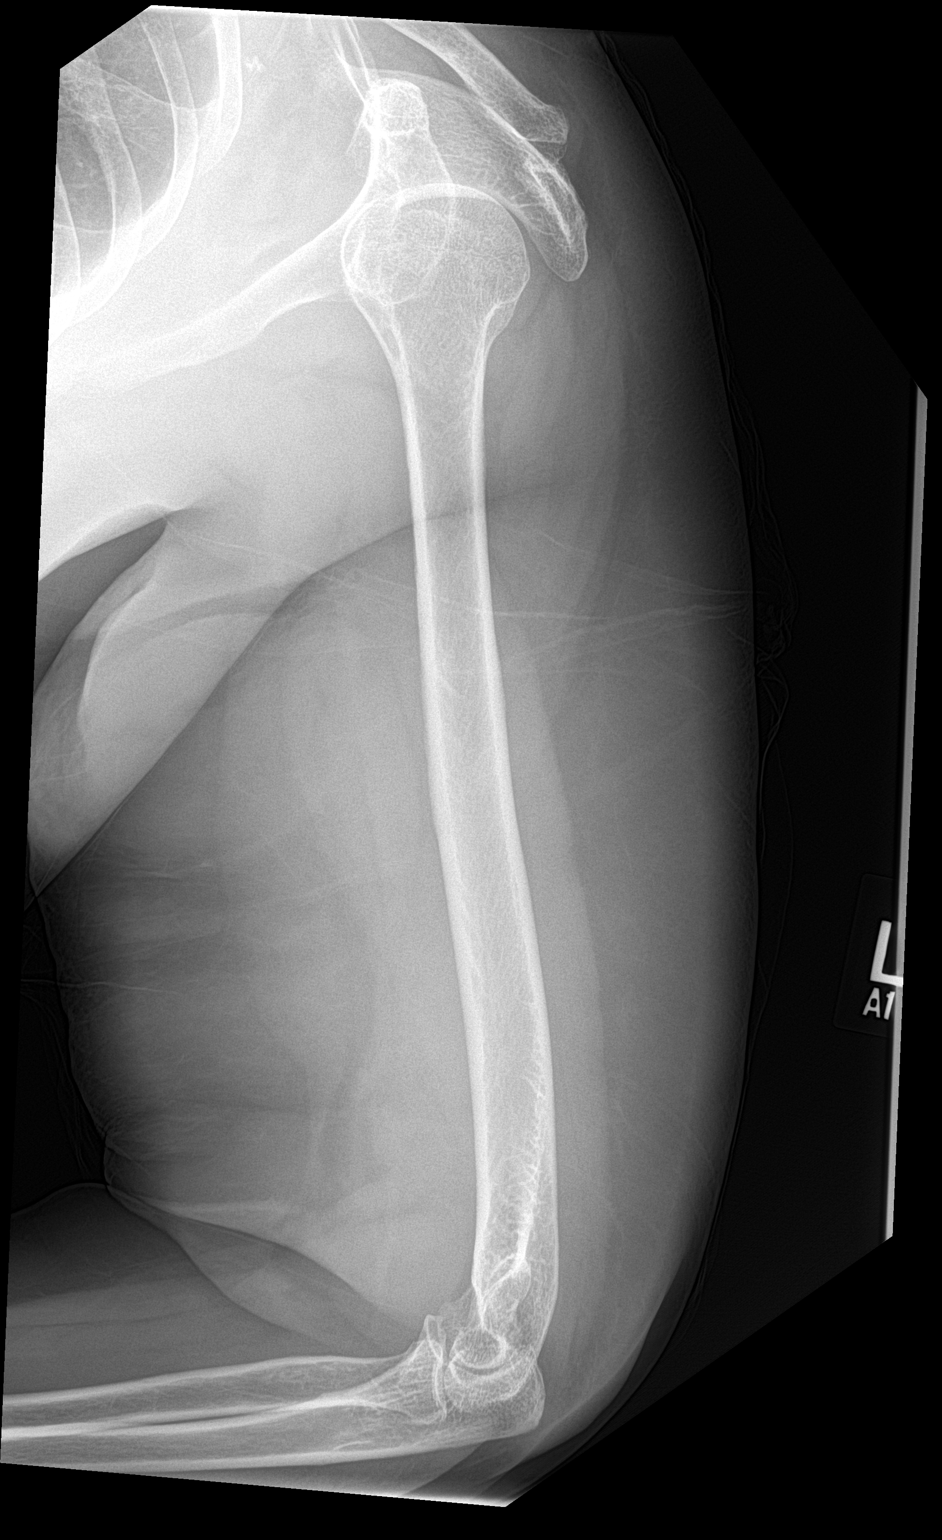

[2 of 2 positions shown; findings below may reference images not displayed]

FINDINGS: No fracture.  No bone lesion.

The glenohumeral and elbow joints are normally spaced and aligned.

Bones are demineralized.

Soft tissues are unremarkable.
IMPRESSION: No fracture, bone lesion or significant joint abnormality.

## 2019-01-20 MED ORDER — HYDROCODONE-ACETAMINOPHEN 7.5-325 MG PO TABS: 1.0000 | ORAL_TABLET | Freq: Three times a day (TID) | ORAL | 0 refills | Status: DC

## 2019-01-20 NOTE — Progress Notes (Signed)
Subjective:  Patient ID: Tina Hayes, female    DOB: Sep 03, 1945  Age: 73 y.o. MRN: [id number]  CC: Back Pain (lower)   Procedure: None  HPI Tina Hayes presents for reevaluation.  She was last seen back in July at which point she had an epidural steroid injection for her chronic left and right lower back pain.  She did very well with 75% improvement lasting approximately 2 months and then over the course of the last few weeks she has had gradual recurrence.  She describes this as mainly a gnawing aching pain right side with radiation into the right calf with associated numbness and tingling in the right foot.  She is not experiencing consistent symptoms on the left side at this point which still seem to be the beneficiary of her most recent epidural.  She is trying to do her stretching strengthening exercises and continued efforts at weight loss with limited success.  She is taking her medications and these are working well for her.  She reports that she takes them responsibly with no illicit or diverting use and these continue to give her good relief.  Unfortunately she has failed more conservative therapy.  She has responded favorably to the opioid medications.  Otherwise she is in her usual state of health with no new changes in lower extremity strength or bowel or bladder function.  Outpatient Medications Prior to Visit  Medication Sig Dispense Refill  . albuterol (PROVENTIL HFA;VENTOLIN HFA) 108 (90 Base) MCG/ACT inhaler Inhale 2 puffs into the lungs every 6 (six) hours as needed for wheezing or shortness of breath. 1 Inhaler 2  . albuterol (PROVENTIL) (2.5 MG/3ML) 0.083% nebulizer solution Take 2.5 mg every 4 (four) hours as needed by nebulization for wheezing or shortness of breath.    [name] amLODipine (NORVASC) 10 MG tablet Take 1 tablet (10 mg total) by mouth daily. 30 tablet 11  . atorvastatin (LIPITOR) 40 MG tablet Take 1 tablet (40 mg total) by mouth at bedtime. 30 tablet 11  .  azithromycin (ZITHROMAX) 250 MG tablet Take 1 tablet (250 mg total) by mouth daily for 3 days. 6 each 0  . B-D UF III MINI PEN NEEDLES 31G X 5 MM MISC USE WITH INSULIN PEN INJECTIONS TWICE DAILY    . Calcium Carbonate-Vitamin D3 (CALCIUM 600-D) 600-400 MG-UNIT TABS Take 1 tablet by mouth daily.     . carvedilol (COREG) 3.125 MG tablet Take 1 tablet (3.125 mg total) by mouth 2 (two) times daily with a meal. 60 tablet 11  . cetirizine (ZYRTEC) 10 MG tablet Take 10 mg by mouth daily.    . clopidogrel (PLAVIX) 75 MG tablet Take 1 tablet (75 mg total) by mouth daily. 30 tablet 11  . COMBIVENT RESPIMAT 20-100 MCG/ACT AERS respimat INHALE 1 PUFF BY MOUTH 4 TIMES DAILY AS NEEDED FOR WHEEZING    . famotidine (PEPCID) 20 MG tablet Take 20 mg by mouth at bedtime.    . insulin aspart (NOVOLOG) 100 UNIT/ML injection Inject 10 Units into the skin 3 (three) times daily.     . insulin glargine (LANTUS) 100 UNIT/ML injection Inject 30-40 Units into the skin See admin instructions. Inject 30 units in the morning at 40 units at bedtime.    . isosorbide mononitrate (IMDUR) 30 MG 24 hr tablet Take 1 tablet (30 mg total) by mouth 2 (two) times daily. 60 tablet 11  . levETIRAcetam (KEPPRA) 750 MG tablet Take 750 mg by mouth 2 (two) times daily.    [name]  losartan (COZAAR) 100 MG tablet Take 1 tablet (100 mg total) by mouth daily. 30 tablet 11  . meloxicam (MOBIC) 7.5 MG tablet Take 7.5 mg by mouth daily as needed.     . montelukast (SINGULAIR) 10 MG tablet Take 10 mg by mouth daily.     . nitroGLYCERIN (NITROSTAT) 0.4 MG SL tablet DISSOLVE ONE TABLET UNDER THE TONGUE EVERY 5 MINUTES AS NEEDED FOR CHEST PAIN.  DO NOT EXCEED A TOTAL OF 3 DOSES IN 15 MINUTES 25 tablet 0  . potassium chloride SA (K-DUR) 20 MEQ tablet Take 2 tablets (40 mEq total) by mouth 2 (two) times daily. Take extra 2 tablets (40 meq) if you take metolazone 130 tablet 11  . predniSONE (DELTASONE) 10 MG tablet Label  & dispense according to the schedule below.  5 Pills PO for 1 day then, 4 Pills PO for 1 day, 3 Pills PO for 1 day, 2 Pills PO for 1 day, 1 Pill PO for 1 days then STOP. 15 tablet 0  . sertraline (ZOLOFT) 50 MG tablet Take 50 mg by mouth daily.    [name] tiotropium (SPIRIVA) 18 MCG inhalation capsule Place 18 mcg into inhaler and inhale daily.    . traZODone (DESYREL) 50 MG tablet Take 50 mg by mouth at bedtime.    [name] HYDROcodone-acetaminophen (NORCO) 7.5-325 MG tablet Take 1 tablet by mouth 3 (three) times daily. 90 tablet 0  . torsemide (DEMADEX) 20 MG tablet Take 3 tablets (60 mg total) by mouth 2 (two) times daily for 30 days. 180 tablet 11   No facility-administered medications prior to visit.     Review of Systems CNS: No confusion or sedation Cardiac: No angina or palpitations GI: No abdominal pain or constipation Constitutional: No nausea vomiting fevers or chills  Objective:  BP 114/66   Pulse (!) 54   Temp 98.4 F (36.9 C) (Oral)   Resp 16   Ht 5\' 11"  (1.803 m)   Wt 300 lb (136.1 kg)   SpO2 100%   BMI 41.84 kg/m    BP Readings from Last 3 Encounters:  01/20/19 114/66  01/19/19 (!) 146/63  11/16/18 (!) 155/70     Wt Readings from Last 3 Encounters:  01/20/19 300 lb (136.1 kg)  01/19/19 298 lb 15.1 oz (135.6 kg)  11/16/18 300 lb (136.1 kg)     Physical Exam Pt is alert and oriented PERRL EOMI HEART IS RRR no murmur or rub LCTA no wheezing or rales MUSCULOSKELETAL reveals an antalgic gait with the patient being morbidly obese with difficulty going from seated to standing position.  Her muscle tone and bulk is at her baseline.  Labs  Lab Results  Component Value Date   HGBA1C 6.5 (H) 01/18/2019   HGBA1C 7.5 (H) 05/27/2018   HGBA1C 7.7 (H) 08/16/2017   Lab Results  Component Value Date   LDLCALC 58 01/30/2017   CREATININE 1.09 (H) 01/18/2019    -------------------------------------------------------------------------------------------------------------------- Lab Results  Component Value Date    WBC 5.2 01/18/2019   HGB 10.0 (L) 01/18/2019   HCT 32.5 (L) 01/18/2019   PLT 218 01/18/2019   GLUCOSE 342 (H) 01/18/2019   CHOL 120 01/30/2017   TRIG 73 01/30/2017   HDL 47 01/30/2017   LDLCALC 58 01/30/2017   ALT 14 01/17/2019   AST 16 01/17/2019   NA 140 01/18/2019   K 4.3 01/18/2019   CL 105 01/18/2019   CREATININE 1.09 (H) 01/18/2019   BUN 19 01/18/2019   CO2 22  01/18/2019   INR 0.98 05/25/2018   HGBA1C 6.5 (H) 01/18/2019    --------------------------------------------------------------------------------------------------------------------- Dg Chest Portable 1 View  Result Date: 01/17/2019 CLINICAL DATA:  Shortness of breath EXAM: PORTABLE CHEST 1 VIEW COMPARISON:  May 25, 2018 FINDINGS: There is mild scarring in the left base. Lungs elsewhere are clear. Heart is borderline enlarged with pulmonary vascularity normal. No adenopathy. There is a focal hiatal hernia. No bone lesions. IMPRESSION: Scarring left base. No edema or consolidation. Borderline cardiac enlargement. Focal hiatal hernia. Status post coronary artery bypass grafting. Electronically Signed   By: Lowella Grip III M.D.   On: 01/17/2019 18:03     Assessment & Plan:   Tina Hayes was seen today for back pain.  Diagnoses and all orders for this visit:  Bilateral sciatica -     Lumbar Epidural Injection; Future  DDD (degenerative disc disease), lumbar -     Lumbar Epidural Injection; Future  Spinal stenosis of lumbar region with neurogenic claudication -     Lumbar Epidural Injection; Future  Chronic pain syndrome  Chronic, continuous use of opioids  Lumbar spondylosis with myelopathy -     Lumbar Epidural Injection; Future  Other orders -     HYDROcodone-acetaminophen (NORCO) 7.5-325 MG tablet; Take 1 tablet by mouth 3 (three) times daily. -     HYDROcodone-acetaminophen (NORCO) 7.5-325 MG tablet; Take 1 tablet by mouth 3 (three) times  daily.        ----------------------------------------------------------------------------------------------------------------------  Problem List Items Addressed This Visit    None    Visit Diagnoses    Bilateral sciatica    -  Primary   Relevant Orders   Lumbar Epidural Injection   DDD (degenerative disc disease), lumbar       Relevant Medications   HYDROcodone-acetaminophen (NORCO) 7.5-325 MG tablet (Start on 02/05/2019)   HYDROcodone-acetaminophen (NORCO) 7.5-325 MG tablet (Start on 03/07/2019)   Other Relevant Orders   Lumbar Epidural Injection   Spinal stenosis of lumbar region with neurogenic claudication       Relevant Medications   HYDROcodone-acetaminophen (NORCO) 7.5-325 MG tablet (Start on 02/05/2019)   HYDROcodone-acetaminophen (NORCO) 7.5-325 MG tablet (Start on 03/07/2019)   Other Relevant Orders   Lumbar Epidural Injection   Chronic pain syndrome       Relevant Medications   HYDROcodone-acetaminophen (NORCO) 7.5-325 MG tablet (Start on 02/05/2019)   HYDROcodone-acetaminophen (NORCO) 7.5-325 MG tablet (Start on 03/07/2019)   Chronic, continuous use of opioids       Lumbar spondylosis with myelopathy       Relevant Medications   HYDROcodone-acetaminophen (NORCO) 7.5-325 MG tablet (Start on 02/05/2019)   HYDROcodone-acetaminophen (NORCO) 7.5-325 MG tablet (Start on 03/07/2019)   Other Relevant Orders   Lumbar Epidural Injection        ----------------------------------------------------------------------------------------------------------------------  1. Bilateral sciatica We will proceed with a repeat epidural in 1 month upon her return.  We have gone over the risks and benefits of this with her in full detail and all questions have been answered.  She is to continue efforts at stretching strengthening and weight loss. - Lumbar Epidural Injection; Future  2. DDD (degenerative disc disease), lumbar As above - Lumbar Epidural Injection; Future  3. Spinal  stenosis of lumbar region with neurogenic claudication As above - Lumbar Epidural Injection; Future  4. Chronic pain syndrome As above.  I have reviewed the [facility] practitioner database information and it is appropriate.  I am going to refill her medications for the next 2 months for October  10 and November 9  5. Chronic, continuous use of opioids As above  6. Lumbar spondylosis with myelopathy As above - Lumbar Epidural Injection; Future    ----------------------------------------------------------------------------------------------------------------------  I am having Tina Hayes. Tina Hayes start on HYDROcodone-acetaminophen. I am also having her maintain her tiotropium, traZODone, albuterol, insulin aspart, insulin glargine, Calcium Carbonate-Vitamin D3, albuterol, montelukast, cetirizine, nitroGLYCERIN, meloxicam, levETIRAcetam, sertraline, Combivent Respimat, B-D UF III MINI PEN NEEDLES, amLODipine, atorvastatin, carvedilol, isosorbide mononitrate, losartan, clopidogrel, torsemide, famotidine, potassium chloride SA, predniSONE, azithromycin, and HYDROcodone-acetaminophen.   Meds ordered this encounter  Medications  . HYDROcodone-acetaminophen (NORCO) 7.5-325 MG tablet    Sig: Take 1 tablet by mouth 3 (three) times daily.    Dispense:  90 tablet    Refill:  0  . HYDROcodone-acetaminophen (NORCO) 7.5-325 MG tablet    Sig: Take 1 tablet by mouth 3 (three) times daily.    Dispense:  90 tablet    Refill:  0   Patient's Medications  New Prescriptions   HYDROCODONE-ACETAMINOPHEN (NORCO) 7.5-325 MG TABLET    Take 1 tablet by mouth 3 (three) times daily.  Previous Medications   ALBUTEROL (PROVENTIL HFA;VENTOLIN HFA) 108 (90 BASE) MCG/ACT INHALER    Inhale 2 puffs into the lungs every 6 (six) hours as needed for wheezing or shortness of breath.   ALBUTEROL (PROVENTIL) (2.5 MG/3ML) 0.083% NEBULIZER SOLUTION    Take 2.5 mg every 4 (four) hours as needed by nebulization for wheezing or  shortness of breath.   AMLODIPINE (NORVASC) 10 MG TABLET    Take 1 tablet (10 mg total) by mouth daily.   ATORVASTATIN (LIPITOR) 40 MG TABLET    Take 1 tablet (40 mg total) by mouth at bedtime.   AZITHROMYCIN (ZITHROMAX) 250 MG TABLET    Take 1 tablet (250 mg total) by mouth daily for 3 days.   B-D UF III MINI PEN NEEDLES 31G X 5 MM MISC    USE WITH INSULIN PEN INJECTIONS TWICE DAILY   CALCIUM CARBONATE-VITAMIN D3 (CALCIUM 600-D) 600-400 MG-UNIT TABS    Take 1 tablet by mouth daily.    CARVEDILOL (COREG) 3.125 MG TABLET    Take 1 tablet (3.125 mg total) by mouth 2 (two) times daily with a meal.   CETIRIZINE (ZYRTEC) 10 MG TABLET    Take 10 mg by mouth daily.   CLOPIDOGREL (PLAVIX) 75 MG TABLET    Take 1 tablet (75 mg total) by mouth daily.   COMBIVENT RESPIMAT 20-100 MCG/ACT AERS RESPIMAT    INHALE 1 PUFF BY MOUTH 4 TIMES DAILY AS NEEDED FOR WHEEZING   FAMOTIDINE (PEPCID) 20 MG TABLET    Take 20 mg by mouth at bedtime.   INSULIN ASPART (NOVOLOG) 100 UNIT/ML INJECTION    Inject 10 Units into the skin 3 (three) times daily.    INSULIN GLARGINE (LANTUS) 100 UNIT/ML INJECTION    Inject 30-40 Units into the skin See admin instructions. Inject 30 units in the morning at 40 units at bedtime.   ISOSORBIDE MONONITRATE (IMDUR) 30 MG 24 HR TABLET    Take 1 tablet (30 mg total) by mouth 2 (two) times daily.   LEVETIRACETAM (KEPPRA) 750 MG TABLET    Take 750 mg by mouth 2 (two) times daily.   LOSARTAN (COZAAR) 100 MG TABLET    Take 1 tablet (100 mg total) by mouth daily.   MELOXICAM (MOBIC) 7.5 MG TABLET    Take 7.5 mg by mouth daily as needed.    MONTELUKAST (SINGULAIR) 10 MG TABLET  Take 10 mg by mouth daily.    NITROGLYCERIN (NITROSTAT) 0.4 MG SL TABLET    DISSOLVE ONE TABLET UNDER THE TONGUE EVERY 5 MINUTES AS NEEDED FOR CHEST PAIN.  DO NOT EXCEED A TOTAL OF 3 DOSES IN 15 MINUTES   POTASSIUM CHLORIDE SA (K-DUR) 20 MEQ TABLET    Take 2 tablets (40 mEq total) by mouth 2 (two) times daily. Take extra 2  tablets (40 meq) if you take metolazone   PREDNISONE (DELTASONE) 10 MG TABLET    Label  & dispense according to the schedule below. 5 Pills PO for 1 day then, 4 Pills PO for 1 day, 3 Pills PO for 1 day, 2 Pills PO for 1 day, 1 Pill PO for 1 days then STOP.   SERTRALINE (ZOLOFT) 50 MG TABLET    Take 50 mg by mouth daily.   TIOTROPIUM (SPIRIVA) 18 MCG INHALATION CAPSULE    Place 18 mcg into inhaler and inhale daily.   TORSEMIDE (DEMADEX) 20 MG TABLET    Take 3 tablets (60 mg total) by mouth 2 (two) times daily for 30 days.   TRAZODONE (DESYREL) 50 MG TABLET    Take 50 mg by mouth at bedtime.  Modified Medications   Modified Medication Previous Medication   HYDROCODONE-ACETAMINOPHEN (NORCO) 7.5-325 MG TABLET HYDROcodone-acetaminophen (NORCO) 7.5-325 MG tablet      Take 1 tablet by mouth 3 (three) times daily.    Take 1 tablet by mouth 3 (three) times daily.  Discontinued Medications   No medications on file   ----------------------------------------------------------------------------------------------------------------------  Follow-up: Return in about 1 month (around 02/19/2019) for evaluation, med refill, procedure.    Molli Barrows, MD

## 2019-01-20 NOTE — Progress Notes (Signed)
Nursing Pain Medication Assessment:  Safety precautions to be maintained throughout the outpatient stay will include: orient to surroundings, keep bed in low position, maintain call bell within reach at all times, provide assistance with transfer out of bed and ambulation.  Medication Inspection Compliance: Tina Hayes did not comply with our request to bring her pills to be counted. She was reminded that bringing the medication bottles, even when empty, is a requirement.  Medication: None brought in. Pill/Patch Count: None available to be counted. Bottle Appearance: No container available. Did not bring bottle(s) to appointment. Filled Date: N/A Last Medication intake:  Today

## 2019-01-25 NOTE — Progress Notes (Deleted)
Patient ID: Tina Hayes, female    DOB: 02/22/1946, 73 y.o.   MRN: [id number]  HPI  Tina Hayes is a 73 y/o female with a history of seizures, HTN, cardiac stents, DM, depression, CAD, COPD, chronic back pain, anxiety, obesity, remote tobacco use and chronic heart failure.   Last echo was done 06/17/16 and showed an EF of 60-65% along with mild MR and moderate TR. Pulmonary systolic pressure was mildly to moderately elevated. Stress test done 06/18/16.  Admitted 01/17/2019 due to COPD exacerbation. Given IV steroids, nebulizer and antibiotics. Discharged after 2 days.    She presents today for her follow-up visit with a chief complaint of   Past Medical History:  Diagnosis Date  . (HFpEF) heart failure with preserved ejection fraction ([place])    a. 05/2016 Echo: EF 60-65%, mild to mod LVH, Gr1 DD, mild MR, mildly dil LA, mod TR, mildly to mod increased PASP.  [name] Acute diastolic heart failure ([place]) 01/27/2017  . Anxiety   . Chest pain 06/16/2016  . CHF (congestive heart failure) ([place])   . Chronic back pain   . Chronic diastolic congestive heart failure ([place]) 02/13/2017  . COPD (chronic obstructive pulmonary disease) ([place])   . Coronary artery disease    a. s/p remote PCI x 5;  b. 2006 s/p CABG x 3 ([place], [place]); b. 05/2016 MV: attenuation corrected images w/o ischemia or wma-->Med rx.  . Coronary artery disease of native artery of native heart with stable angina pectoris ([place]) 06/17/2016  . Depression   . Diabetes mellitus without complication ([place])   . Essential hypertension 06/30/2016  . Heart attack ([place])    Total of 3 per pt.  . Hypertension   . Hypertensive urgency 06/03/2015  . Seizure ([place])   . Seizures ([place])    Past Surgical History:  Procedure Laterality Date  . ABDOMINAL HYSTERECTOMY    . ANKLE SURGERY Right   . CATARACT EXTRACTION W/ INTRAOCULAR LENS  IMPLANT, BILATERAL    . COLONOSCOPY WITH PROPOFOL N/A 05/04/2017   Procedure: COLONOSCOPY WITH  PROPOFOL;  Surgeon: [name], MD;  Location: [facility] ENDOSCOPY;  Service: Endoscopy;  Laterality: N/A;  . CORONARY ANGIOPLASTY    . CORONARY ARTERY BYPASS GRAFT     TRIPLE BYPASS  . ESOPHAGOGASTRODUODENOSCOPY (EGD) WITH PROPOFOL N/A 05/04/2017   Procedure: ESOPHAGOGASTRODUODENOSCOPY (EGD) WITH PROPOFOL;  Surgeon: [name], MD;  Location: [facility] ENDOSCOPY;  Service: Endoscopy;  Laterality: N/A;  . EXCISION MASS LOWER EXTREMETIES Right 03/02/2018   Procedure: EXCISION SOFT TISSUE MASS FROM MEDIAL ASPECT OF RIGHT ANKLE;  Surgeon: [name], MD;  Location: [facility] ORS;  Service: Orthopedics;  Laterality: Right;  . FRACTURE SURGERY    . KNEE SURGERY Right   . MOUTH SURGERY Left 07/14/2017   Family History  Problem Relation Age of Onset  . Diabetes Other   . Hypertension Other   . Diabetes Mother   . Heart failure Mother   . Heart disease Mother   . Heart attack Mother   . Stroke Mother   . Depression Mother   . Hypertension Mother   . Cancer Sister        brain  . Hypertension Sister   . Diabetes Brother   . Hypertension Brother   . Heart failure Sister   . Heart attack Sister   . SIDS Sister    Social History   Tobacco Use  . Smoking status: Former Smoker    Packs/day:  0.25    Years: 20.00    Pack years: 5.00    Types: Cigarettes    Quit date: 07/01/2006    Years since quitting: 12.5  . Smokeless tobacco: Never Used  Substance Use Topics  . Alcohol use: No    Alcohol/week: 0.0 standard drinks   Allergies  Allergen Reactions  . Aspirin Anaphylaxis     Review of Systems  Constitutional: Positive for fatigue. Negative for appetite change.  HENT: Negative for congestion, postnasal drip and trouble swallowing.   Eyes: Negative.   Respiratory: Positive for shortness of breath. Negative for cough and chest tightness.   Cardiovascular: Positive for leg swelling (improving). Negative for chest pain and palpitations.  Gastrointestinal: Negative for abdominal  distention, abdominal pain and nausea.  Endocrine: Negative.   Genitourinary: Negative.   Musculoskeletal: Positive for arthralgias (left knee) and back pain (chronic).  Skin: Negative.   Allergic/Immunologic: Negative.   Neurological: Positive for numbness (right leg) and headaches. Negative for dizziness, weakness and light-headedness.  Hematological: Negative for adenopathy. Does not bruise/bleed easily.  Psychiatric/Behavioral: Negative for dysphoric mood, sleep disturbance (sleeping on 3 pillows) and suicidal ideas. The patient is not nervous/anxious.     Physical Exam  Constitutional: She is oriented to person, place, and time. She appears well-developed and well-nourished.  HENT:  Head: Normocephalic and atraumatic.  Neck: Normal range of motion. Neck supple. No JVD present.  Cardiovascular: Normal rate and regular rhythm.  Pulmonary/Chest: Effort normal. She has no wheezes. She has no rales.  Abdominal: Soft. She exhibits no distension. There is no abdominal tenderness.  Musculoskeletal:        General: Edema (trace edema around left ankle) present. No tenderness.  Neurological: She is alert and oriented to person, place, and time.  Skin: Skin is warm and dry.  Psychiatric: She has a normal mood and affect. Her behavior is normal. Thought content normal.  Nursing note and vitals reviewed.  Assessment & Plan:  1: Chronic heart failure with preserved ejection fraction- - NYHA class II - euvolemic today - weighing daily; Reminded to call for an overnight weight gain of >2 pounds or a weekly weight gain of >5 pounds.  - weight 302.4 from here last visit here ~7 months ago - has not been adding salt to her foods and is trying to eat low sodium foods - trying to keep fluid intake closer to 60 ounces daily  - had telemedicine visit with cardiologist Rockey Situ) 08/17/2018 - BNP 01/17/2019 was 120.0   2: HTN- - BP - BMP from 01/18/2019 reviewed; sodium 140, potassium 4.3,  creatinine 1.09 and GFR 59 - [facility] PCP at the [facility]  3: Diabetes-  - fasting glucose at home yesterday was  - A1c on 01/18/2019 was 6.5%  Patient did not bring her medications nor a list. Each medication was verbally reviewed with the patient and she was encouraged to bring the bottles to every visit to confirm accuracy of list.

## 2019-01-27 ENCOUNTER — Ambulatory Visit: Payer: Medicare HMO | Admitting: Family

## 2019-02-01 ENCOUNTER — Emergency Department: Payer: Medicare Other

## 2019-02-01 ENCOUNTER — Other Ambulatory Visit: Payer: Self-pay

## 2019-02-01 ENCOUNTER — Inpatient Hospital Stay
Admission: EM | Admit: 2019-02-01 | Discharge: 2019-02-03 | DRG: 191 | Disposition: A | Discharge status: Home Health | Payer: Medicare Other | Attending: Internal Medicine | Admitting: Internal Medicine

## 2019-02-01 ENCOUNTER — Encounter: Payer: Self-pay | Admitting: Emergency Medicine

## 2019-02-01 DIAGNOSIS — I5032 Chronic diastolic (congestive) heart failure: Secondary | ICD-10-CM | POA: Diagnosis present

## 2019-02-01 DIAGNOSIS — I130 Hypertensive heart and chronic kidney disease with heart failure and stage 1 through stage 4 chronic kidney disease, or unspecified chronic kidney disease: Secondary | ICD-10-CM | POA: Diagnosis present

## 2019-02-01 DIAGNOSIS — Z951 Presence of aortocoronary bypass graft: Secondary | ICD-10-CM

## 2019-02-01 DIAGNOSIS — Z87891 Personal history of nicotine dependence: Secondary | ICD-10-CM

## 2019-02-01 DIAGNOSIS — J45901 Unspecified asthma with (acute) exacerbation: Secondary | ICD-10-CM | POA: Diagnosis present

## 2019-02-01 DIAGNOSIS — E1122 Type 2 diabetes mellitus with diabetic chronic kidney disease: Secondary | ICD-10-CM | POA: Diagnosis present

## 2019-02-01 DIAGNOSIS — Z7902 Long term (current) use of antithrombotics/antiplatelets: Secondary | ICD-10-CM | POA: Diagnosis not present

## 2019-02-01 DIAGNOSIS — J441 Chronic obstructive pulmonary disease with (acute) exacerbation: Secondary | ICD-10-CM | POA: Diagnosis not present

## 2019-02-01 DIAGNOSIS — Z886 Allergy status to analgesic agent status: Secondary | ICD-10-CM | POA: Diagnosis not present

## 2019-02-01 DIAGNOSIS — Z87892 Personal history of anaphylaxis: Secondary | ICD-10-CM | POA: Diagnosis not present

## 2019-02-01 DIAGNOSIS — Z794 Long term (current) use of insulin: Secondary | ICD-10-CM | POA: Diagnosis not present

## 2019-02-01 DIAGNOSIS — Z79899 Other long term (current) drug therapy: Secondary | ICD-10-CM | POA: Diagnosis not present

## 2019-02-01 DIAGNOSIS — F329 Major depressive disorder, single episode, unspecified: Secondary | ICD-10-CM | POA: Diagnosis present

## 2019-02-01 DIAGNOSIS — I25118 Atherosclerotic heart disease of native coronary artery with other forms of angina pectoris: Secondary | ICD-10-CM | POA: Diagnosis not present

## 2019-02-01 DIAGNOSIS — Z833 Family history of diabetes mellitus: Secondary | ICD-10-CM

## 2019-02-01 DIAGNOSIS — Z9861 Coronary angioplasty status: Secondary | ICD-10-CM

## 2019-02-01 DIAGNOSIS — Z66 Do not resuscitate: Secondary | ICD-10-CM | POA: Diagnosis not present

## 2019-02-01 DIAGNOSIS — Z7951 Long term (current) use of inhaled steroids: Secondary | ICD-10-CM

## 2019-02-01 DIAGNOSIS — N189 Chronic kidney disease, unspecified: Secondary | ICD-10-CM | POA: Diagnosis present

## 2019-02-01 DIAGNOSIS — E119 Type 2 diabetes mellitus without complications: Secondary | ICD-10-CM

## 2019-02-01 DIAGNOSIS — I10 Essential (primary) hypertension: Secondary | ICD-10-CM | POA: Diagnosis present

## 2019-02-01 DIAGNOSIS — I2510 Atherosclerotic heart disease of native coronary artery without angina pectoris: Secondary | ICD-10-CM | POA: Diagnosis present

## 2019-02-01 DIAGNOSIS — Z20828 Contact with and (suspected) exposure to other viral communicable diseases: Secondary | ICD-10-CM | POA: Diagnosis present

## 2019-02-01 LAB — CBC WITH DIFFERENTIAL/PLATELET
Abs Immature Granulocytes: 0.05 10*3/uL (ref 0.00–0.07)
Basophils Absolute: 0.1 10*3/uL (ref 0.0–0.1)
Basophils Relative: 1 %
Eosinophils Absolute: 0.3 10*3/uL (ref 0.0–0.5)
Eosinophils Relative: 4 %
HCT: 35.3 % — ABNORMAL LOW (ref 36.0–46.0)
Hemoglobin: 11 g/dL — ABNORMAL LOW (ref 12.0–15.0)
Immature Granulocytes: 1 %
Lymphocytes Relative: 33 %
Lymphs Abs: 2.5 10*3/uL (ref 0.7–4.0)
MCH: 26.8 pg (ref 26.0–34.0)
MCHC: 31.2 g/dL (ref 30.0–36.0)
MCV: 86.1 fL (ref 80.0–100.0)
Monocytes Absolute: 0.5 10*3/uL (ref 0.1–1.0)
Monocytes Relative: 6 %
Neutro Abs: 4.2 10*3/uL (ref 1.7–7.7)
Neutrophils Relative %: 55 %
Platelets: 274 10*3/uL (ref 150–400)
RBC: 4.1 MIL/uL (ref 3.87–5.11)
RDW: 16.4 % — ABNORMAL HIGH (ref 11.5–15.5)
WBC: 7.7 10*3/uL (ref 4.0–10.5)
nRBC: 0 % (ref 0.0–0.2)

## 2019-02-01 LAB — COMPREHENSIVE METABOLIC PANEL
ALT: 12 U/L (ref 0–44)
AST: 25 U/L (ref 15–41)
Albumin: 3.9 g/dL (ref 3.5–5.0)
Alkaline Phosphatase: 163 U/L — ABNORMAL HIGH (ref 38–126)
Anion gap: 13 (ref 5–15)
BUN: 23 mg/dL (ref 8–23)
CO2: 24 mmol/L (ref 22–32)
Calcium: 9.1 mg/dL (ref 8.9–10.3)
Chloride: 104 mmol/L (ref 98–111)
Creatinine, Ser: 1.24 mg/dL — ABNORMAL HIGH (ref 0.44–1.00)
GFR calc Af Amer: 51 mL/min — ABNORMAL LOW (ref >60)
GFR calc non Af Amer: 44 mL/min — ABNORMAL LOW (ref >60)
Glucose, Bld: 101 mg/dL — ABNORMAL HIGH (ref 70–99)
Potassium: 3.9 mmol/L (ref 3.5–5.1)
Sodium: 141 mmol/L (ref 135–145)
Total Bilirubin: 0.8 mg/dL (ref 0.3–1.2)
Total Protein: 7.7 g/dL (ref 6.5–8.1)

## 2019-02-01 LAB — BRAIN NATRIURETIC PEPTIDE: B Natriuretic Peptide: 64 pg/mL (ref 0.0–100.0)

## 2019-02-01 LAB — SARS CORONAVIRUS 2 BY RT PCR (HOSPITAL ORDER, PERFORMED IN ~~LOC~~ HOSPITAL LAB): SARS Coronavirus 2: NEGATIVE

## 2019-02-01 MED ORDER — ALBUTEROL SULFATE (2.5 MG/3ML) 0.083% IN NEBU
5.0000 mg | INHALATION_SOLUTION | Freq: Once | RESPIRATORY_TRACT | Status: AC
  Administered 2019-02-01: 21:00:00 5 mg via RESPIRATORY_TRACT
  Filled 2019-02-01: qty 6

## 2019-02-01 MED ORDER — METHYLPREDNISOLONE SODIUM SUCC 125 MG IJ SOLR
125.0000 mg | Freq: Once | INTRAMUSCULAR | Status: AC
  Administered 2019-02-01: 21:00:00 125 mg via INTRAVENOUS
  Filled 2019-02-01: qty 2

## 2019-02-01 MED ORDER — ALBUTEROL SULFATE (2.5 MG/3ML) 0.083% IN NEBU: 2.5000 mg | INHALATION_SOLUTION | Freq: Once | RESPIRATORY_TRACT | Status: DC

## 2019-02-01 MED ORDER — IPRATROPIUM BROMIDE 0.02 % IN SOLN
0.5000 mg | Freq: Once | RESPIRATORY_TRACT | Status: AC
  Administered 2019-02-01: 21:00:00 0.5 mg via RESPIRATORY_TRACT
  Filled 2019-02-01: qty 2.5

## 2019-02-01 NOTE — ED Notes (Signed)
Patient given neb treatments, told to call if unable to tolerate. Pharmacy said OK to put atrovent and albuterol together

## 2019-02-01 NOTE — ED Triage Notes (Signed)
Patient arrives EMS from home with shortness of breath, just discharged after admitted for same. Says it feels like "COPD exac"

## 2019-02-01 NOTE — ED Notes (Signed)
Patient says she feels slightly better after nebs and solumedrol

## 2019-02-01 NOTE — ED Provider Notes (Signed)
[facility] Emergency Department Provider Note  ____________________________________________   First MD Initiated Contact with Patient 02/01/19 2039     (approximate)  I have reviewed the triage vital signs and the nursing notes.   HISTORY  Chief Complaint Shortness of Breath    HPI Tina Hayes is a 73 y.o. female with past medical history as below here with shortness of breath.  The patient was recently admitted for COPD exacerbation.  She was told that she needed to be on oxygen.  Ever, she been unable to set this up.  She states that since stopping the prednisone, she has had progressive worsening and return of her shortness of breath.  She been wheezing.  She feels incredibly dyspneic whenever she moves, is having difficulty getting around her house.  No fevers.  No chills.  No known coronavirus exposures.  No sputum production.        Past Medical History:  Diagnosis Date  . (HFpEF) heart failure with preserved ejection fraction ([place])    a. 05/2016 Echo: EF 60-65%, mild to mod LVH, Gr1 DD, mild MR, mildly dil LA, mod TR, mildly to mod increased PASP.  [name] Acute diastolic heart failure ([place]) 01/27/2017  . Anxiety   . Chest pain 06/16/2016  . CHF (congestive heart failure) ([place])   . Chronic back pain   . Chronic diastolic congestive heart failure ([place]) 02/13/2017  . COPD (chronic obstructive pulmonary disease) ([place])   . Coronary artery disease    a. s/p remote PCI x 5;  b. 2006 s/p CABG x 3 ([place], [place]); b. 05/2016 MV: attenuation corrected images w/o ischemia or wma-->Med rx.  . Coronary artery disease of native artery of native heart with stable angina pectoris ([place]) 06/17/2016  . Depression   . Diabetes mellitus without complication ([place])   . Essential hypertension 06/30/2016  . Heart attack ([place])    Total of 3 per pt.  . Hypertension   . Hypertensive urgency 06/03/2015  . Seizure ([place])   . Seizures [facility])      Patient Active Problem List   Diagnosis Date Noted  . Occlusion and stenosis of vertebral artery 08/31/2018  . AKI (acute kidney injury) ([place]) 05/25/2018  . Seizures ([place]) 11/10/2017  . Mass of right lower leg 09/11/2017  . Dysphagia 04/23/2017  . Chronic diastolic congestive heart failure ([place]) 02/13/2017  . Acute diastolic heart failure ([place]) 01/27/2017  . Depression 12/11/2016  . Essential hypertension 06/30/2016  . Snoring 06/30/2016  . Diabetes mellitus ([place]) 06/30/2016  . Coronary artery disease of native artery of native heart with stable angina pectoris ([place]) 06/17/2016  . Morbid obesity ([place]) 06/17/2016  . COPD (chronic obstructive pulmonary disease) ([place]) 08/28/2015  . COPD with acute exacerbation ([place]) 04/14/2015    Past Surgical History:  Procedure Laterality Date  . ABDOMINAL HYSTERECTOMY    . ANKLE SURGERY Right   . CATARACT EXTRACTION W/ INTRAOCULAR LENS  IMPLANT, BILATERAL    . COLONOSCOPY WITH PROPOFOL N/A 05/04/2017   Procedure: COLONOSCOPY WITH PROPOFOL;  Surgeon: [name], MD;  Location: [facility] ENDOSCOPY;  Service: Endoscopy;  Laterality: N/A;  . CORONARY ANGIOPLASTY    . CORONARY ARTERY BYPASS GRAFT     TRIPLE BYPASS  . ESOPHAGOGASTRODUODENOSCOPY (EGD) WITH PROPOFOL N/A 05/04/2017   Procedure: ESOPHAGOGASTRODUODENOSCOPY (EGD) WITH PROPOFOL;  Surgeon: [name], MD;  Location: [facility] ENDOSCOPY;  Service: Endoscopy;  Laterality: N/A;  . EXCISION MASS LOWER EXTREMETIES Right 03/02/2018  Procedure: EXCISION SOFT TISSUE MASS FROM MEDIAL ASPECT OF RIGHT ANKLE;  Surgeon: [name], MD;  Location: [facility] ORS;  Service: Orthopedics;  Laterality: Right;  . FRACTURE SURGERY    . KNEE SURGERY Right   . MOUTH SURGERY Left 07/14/2017    Prior to Admission medications   Medication Sig Start Date End Date Taking? Authorizing Provider  albuterol (PROVENTIL HFA;VENTOLIN HFA) 108 (90 Base) MCG/ACT inhaler Inhale 2 puffs into the lungs every 6 (six) hours as  needed for wheezing or shortness of breath. 08/30/15  Yes Epifanio Lesches, MD  albuterol (PROVENTIL) (2.5 MG/3ML) 0.083% nebulizer solution Take 2.5 mg every 4 (four) hours as needed by nebulization for wheezing or shortness of breath.   Yes [provider]  amLODipine (NORVASC) 10 MG tablet Take 1 tablet (10 mg total) by mouth daily. 08/17/18  Yes Minna Merritts, MD  atorvastatin (LIPITOR) 40 MG tablet Take 1 tablet (40 mg total) by mouth at bedtime. 08/17/18  Yes Gollan, Kathlene November, MD  Calcium Carbonate-Vitamin D3 (CALCIUM 600-D) 600-400 MG-UNIT TABS Take 1 tablet by mouth daily.    Yes [provider]  carvedilol (COREG) 3.125 MG tablet Take 1 tablet (3.125 mg total) by mouth 2 (two) times daily with a meal. 08/17/18  Yes Gollan, Kathlene November, MD  cetirizine (ZYRTEC) 10 MG tablet Take 10 mg by mouth daily.   Yes [provider]  clopidogrel (PLAVIX) 75 MG tablet Take 1 tablet (75 mg total) by mouth daily. 08/17/18  Yes Gollan, Kathlene November, MD  COMBIVENT RESPIMAT 20-100 MCG/ACT AERS respimat Inhale 1 puff into the lungs 4 (four) times daily as needed for wheezing.  07/30/18  Yes [provider]  famotidine (PEPCID) 20 MG tablet Take 20 mg by mouth at bedtime.   Yes [provider]  HYDROcodone-acetaminophen (NORCO) 7.5-325 MG tablet Take 1 tablet by mouth 3 (three) times daily. 02/05/19 03/07/19 Yes Molli Barrows, MD  insulin aspart (NOVOLOG) 100 UNIT/ML injection Inject 10 Units into the skin 3 (three) times daily.    Yes [provider]  insulin glargine (LANTUS) 100 UNIT/ML injection Inject 30-40 Units into the skin See admin instructions. Inject 30 units in the morning at 40 units at bedtime.   Yes [provider]  isosorbide mononitrate (IMDUR) 30 MG 24 hr tablet Take 1 tablet (30 mg total) by mouth 2 (two) times daily. 08/17/18  Yes Minna Merritts, MD  levETIRAcetam (KEPPRA) 750 MG tablet Take 750 mg by mouth 2 (two) times daily.   Yes  [provider]  losartan (COZAAR) 100 MG tablet Take 1 tablet (100 mg total) by mouth daily. 08/17/18  Yes Gollan, Kathlene November, MD  meloxicam (MOBIC) 7.5 MG tablet Take 7.5 mg by mouth daily as needed for pain.    Yes [provider]  montelukast (SINGULAIR) 10 MG tablet Take 10 mg by mouth daily.  11/05/17  Yes [provider]  nitroGLYCERIN (NITROSTAT) 0.4 MG SL tablet DISSOLVE ONE TABLET UNDER THE TONGUE EVERY 5 MINUTES AS NEEDED FOR CHEST PAIN.  DO NOT EXCEED A TOTAL OF 3 DOSES IN 15 MINUTES Patient taking differently: Place 0.4 mg under the tongue every 5 (five) minutes as needed for chest pain.  07/09/18  Yes Gollan, Kathlene November, MD  potassium chloride SA (K-DUR) 20 MEQ tablet Take 2 tablets (40 mEq total) by mouth 2 (two) times daily. Take extra 2 tablets (40 meq) if you take metolazone Patient taking differently: Take 40 mEq by  mouth 2 (two) times daily.  01/05/19  Yes Hackney, Otila Kluver A, FNP  sertraline (ZOLOFT) 50 MG tablet Take 50 mg by mouth daily.   Yes [provider]  tiotropium (SPIRIVA) 18 MCG inhalation capsule Place 18 mcg into inhaler and inhale daily.   Yes [provider]  torsemide (DEMADEX) 20 MG tablet Take 60 mg by mouth 2 (two) times daily.   Yes [provider]  traZODone (DESYREL) 50 MG tablet Take 50 mg by mouth at bedtime.   Yes [provider]  B-D UF III MINI PEN NEEDLES 31G X 5 MM MISC USE WITH INSULIN PEN INJECTIONS TWICE DAILY 08/05/18   [provider]  HYDROcodone-acetaminophen (NORCO) 7.5-325 MG tablet Take 1 tablet by mouth 3 (three) times daily. 03/07/19 04/06/19  Molli Barrows, MD    Allergies Aspirin  Family History  Problem Relation Age of Onset  . Diabetes Other   . Hypertension Other   . Diabetes Mother   . Heart failure Mother   . Heart disease Mother   . Heart attack Mother   . Stroke Mother   . Depression Mother   . Hypertension Mother   . Cancer Sister        brain  .  Hypertension Sister   . Diabetes Brother   . Hypertension Brother   . Heart failure Sister   . Heart attack Sister   . SIDS Sister     Social History Social History   Tobacco Use  . Smoking status: Former Smoker    Packs/day: 0.25    Years: 20.00    Pack years: 5.00    Types: Cigarettes    Quit date: 07/01/2006    Years since quitting: 12.6  . Smokeless tobacco: Never Used  Substance Use Topics  . Alcohol use: No    Alcohol/week: 0.0 standard drinks  . Drug use: No    Review of Systems  Review of Systems  Constitutional: Negative for fatigue and fever.  HENT: Negative for congestion and sore throat.   Eyes: Negative for visual disturbance.  Respiratory: Positive for shortness of breath and wheezing. Negative for cough.   Cardiovascular: Negative for chest pain.  Gastrointestinal: Positive for nausea. Negative for abdominal pain, diarrhea and vomiting.  Genitourinary: Negative for flank pain.  Musculoskeletal: Negative for back pain and neck pain.  Skin: Negative for rash and wound.  Neurological: Positive for weakness.  All other systems reviewed and are negative.    ____________________________________________  PHYSICAL EXAM:      VITAL SIGNS: ED Triage Vitals  Enc Vitals Group     BP 02/01/19 2050 (!) 153/71     Pulse Rate 02/01/19 2050 (!) 59     Resp 02/01/19 2050 18     Temp 02/01/19 2121 98.3 F (36.8 C)     Temp Source 02/01/19 2121 Oral     SpO2 02/01/19 2047 98 %     Weight 02/01/19 2050 299 lb 13.2 oz (136 kg)     Height 02/01/19 2050 5\' 11"  (1.803 m)     Head Circumference --      Peak Flow --      Pain Score 02/01/19 2050 0     Pain Loc --      Pain Edu? --      Excl. in [place]? --      Physical Exam Vitals signs and nursing note reviewed.  Constitutional:      General: She is not in acute distress.  Appearance: She is well-developed.  HENT:     Head: Normocephalic and atraumatic.  Eyes:     Conjunctiva/sclera: Conjunctivae normal.   Neck:     Musculoskeletal: Neck supple.  Cardiovascular:     Rate and Rhythm: Regular rhythm. Tachycardia present.     Heart sounds: Normal heart sounds. No murmur. No friction rub.  Pulmonary:     Effort: Pulmonary effort is normal. No respiratory distress.     Breath sounds: Examination of the right-upper field reveals wheezing. Examination of the left-upper field reveals wheezing. Examination of the right-middle field reveals wheezing. Examination of the left-middle field reveals wheezing. Examination of the right-lower field reveals wheezing. Examination of the left-lower field reveals wheezing. Wheezing present. No rales.  Abdominal:     General: There is no distension.     Palpations: Abdomen is soft.     Tenderness: There is no abdominal tenderness.  Skin:    General: Skin is warm.     Capillary Refill: Capillary refill takes less than 2 seconds.  Neurological:     Mental Status: She is alert and oriented to person, place, and time.     Motor: No abnormal muscle tone.       ____________________________________________   LABS (all labs ordered are listed, but only abnormal results are displayed)  Labs Reviewed  CBC WITH DIFFERENTIAL/PLATELET - Abnormal; Notable for the following components:      Result Value   Hemoglobin 11.0 (*)    HCT 35.3 (*)    RDW 16.4 (*)    All other components within normal limits  COMPREHENSIVE METABOLIC PANEL - Abnormal; Notable for the following components:   Glucose, Bld 101 (*)    Creatinine, Ser 1.24 (*)    Alkaline Phosphatase 163 (*)    GFR calc non Af Amer 44 (*)    GFR calc Af Amer 51 (*)    All other components within normal limits  GLUCOSE, CAPILLARY - Abnormal; Notable for the following components:   Glucose-Capillary 211 (*)    All other components within normal limits  SARS CORONAVIRUS 2 (HOSPITAL ORDER, [facility])  BRAIN NATRIURETIC PEPTIDE  BASIC METABOLIC PANEL  CBC     ____________________________________________  EKG: NSR, VR 59. PR 100, qrS 461. Non-specific TW changes, no acute st elevations or depressions. ________________________________________  RADIOLOGY All imaging, including plain films, CT scans, and ultrasounds, independently reviewed by me, and interpretations confirmed via formal radiology reads.  ED MD interpretation:     Official radiology report(s): Dg Chest Portable 1 View  Result Date: 02/01/2019 CLINICAL DATA:  Shortness of breath EXAM: PORTABLE CHEST 1 VIEW COMPARISON:  01/17/2019, 03/16/2017 FINDINGS: Post sternotomy changes. Cardiomegaly. Diffuse interstitial opacity appears chronic. No consolidation or effusion. No pneumothorax. Moderate to large hiatal hernia IMPRESSION: No active disease. Diffuse chronic appearing interstitial opacity. Hiatal hernia Electronically Signed   By: Donavan Foil M.D.   On: 02/01/2019 21:13    ____________________________________________  PROCEDURES   Procedure(s) performed (including Critical Care):  Procedures  ____________________________________________  INITIAL IMPRESSION / MDM / [place] / ED COURSE  As part of my medical decision making, I reviewed the following data within the electronic MEDICAL RECORD NUMBER Notes from prior ED visits and [place] Controlled Substance Database      *SHYLI WOOD was evaluated in Emergency Department on 02/02/2019 for the symptoms described in the history of present illness. She was evaluated in the context of the global COVID-19 pandemic, which necessitated  consideration that the patient might be at risk for infection with the SARS-CoV-2 virus that causes COVID-19. Institutional protocols and algorithms that pertain to the evaluation of patients at risk for COVID-19 are in a state of rapid change based on information released by regulatory bodies including the CDC and federal and state organizations. These policies and algorithms were followed during  the patient's care in the ED.  Some ED evaluations and interventions may be delayed as a result of limited staffing during the pandemic.*      Medical Decision Making: 73 year old female here with recurrent likely COPD exacerbation.  No pneumonia on chest x-ray.  No fevers or chills.  Given negative.  She has persistent wheezing and tachypnea after breathing treatments and steroids.  Admit to medicine.  ____________________________________________  FINAL CLINICAL IMPRESSION(S) / ED DIAGNOSES  Final diagnoses:  COPD exacerbation ([place])     MEDICATIONS GIVEN DURING THIS VISIT:  Medications  methylPREDNISolone sodium succinate (SOLU-MEDROL) 125 mg/2 mL injection 60 mg (has no administration in time range)  amLODipine (NORVASC) tablet 10 mg (has no administration in time range)  atorvastatin (LIPITOR) tablet 40 mg (has no administration in time range)  carvedilol (COREG) tablet 3.125 mg (has no administration in time range)  isosorbide mononitrate (IMDUR) 24 hr tablet 30 mg (has no administration in time range)  losartan (COZAAR) tablet 100 mg (has no administration in time range)  sertraline (ZOLOFT) tablet 50 mg (has no administration in time range)  traZODone (DESYREL) tablet 50 mg (has no administration in time range)  famotidine (PEPCID) tablet 20 mg (has no administration in time range)  clopidogrel (PLAVIX) tablet 75 mg (has no administration in time range)  levETIRAcetam (KEPPRA) tablet 750 mg (has no administration in time range)  ipratropium-albuterol (DUONEB) 0.5-2.5 (3) MG/3ML nebulizer solution 3-6 mL (has no administration in time range)  montelukast (SINGULAIR) tablet 10 mg (has no administration in time range)  tiotropium (SPIRIVA) inhalation capsule ([facility] use ONLY) 18 mcg (has no administration in time range)  enoxaparin (LOVENOX) injection 40 mg (has no administration in time range)  acetaminophen (TYLENOL) tablet 650 mg (has no administration in time range)    Or   acetaminophen (TYLENOL) suppository 650 mg (has no administration in time range)  oxyCODONE (Oxy IR/ROXICODONE) immediate release tablet 5 mg (has no administration in time range)  ondansetron (ZOFRAN) tablet 4 mg (has no administration in time range)    Or  ondansetron (ZOFRAN) injection 4 mg (has no administration in time range)  insulin aspart (novoLOG) injection 0-15 Units (has no administration in time range)  insulin aspart (novoLOG) injection 0-5 Units (has no administration in time range)  azithromycin (ZITHROMAX) tablet 500 mg (has no administration in time range)  methylPREDNISolone sodium succinate (SOLU-MEDROL) 125 mg/2 mL injection 125 mg (125 mg Intravenous Given 02/01/19 2120)  albuterol (PROVENTIL) (2.5 MG/3ML) 0.083% nebulizer solution 5 mg (5 mg Nebulization Given 02/01/19 2121)  ipratropium (ATROVENT) nebulizer solution 0.5 mg (0.5 mg Nebulization Given 02/01/19 2121)  levalbuterol (XOPENEX) nebulizer solution 0.63 mg (0.63 mg Nebulization Given 02/02/19 0043)     ED Discharge Orders    None       Note:  This document was prepared using Dragon voice recognition software and may include unintentional dictation errors.   Duffy Bruce, MD 02/02/19 [id number]

## 2019-02-02 ENCOUNTER — Inpatient Hospital Stay (HOSPITAL_COMMUNITY)
Admit: 2019-02-02 | Discharge: 2019-02-02 | Disposition: A | Discharge status: Home / Self Care | Payer: Medicare Other | Attending: Pulmonary Disease | Admitting: Pulmonary Disease

## 2019-02-02 DIAGNOSIS — Z833 Family history of diabetes mellitus: Secondary | ICD-10-CM | POA: Diagnosis not present

## 2019-02-02 DIAGNOSIS — J45901 Unspecified asthma with (acute) exacerbation: Secondary | ICD-10-CM | POA: Diagnosis not present

## 2019-02-02 DIAGNOSIS — Z87892 Personal history of anaphylaxis: Secondary | ICD-10-CM | POA: Diagnosis not present

## 2019-02-02 DIAGNOSIS — I361 Nonrheumatic tricuspid (valve) insufficiency: Secondary | ICD-10-CM

## 2019-02-02 DIAGNOSIS — I5032 Chronic diastolic (congestive) heart failure: Secondary | ICD-10-CM | POA: Diagnosis not present

## 2019-02-02 DIAGNOSIS — Z9861 Coronary angioplasty status: Secondary | ICD-10-CM | POA: Diagnosis not present

## 2019-02-02 DIAGNOSIS — I25118 Atherosclerotic heart disease of native coronary artery with other forms of angina pectoris: Secondary | ICD-10-CM | POA: Diagnosis not present

## 2019-02-02 DIAGNOSIS — Z20828 Contact with and (suspected) exposure to other viral communicable diseases: Secondary | ICD-10-CM | POA: Diagnosis not present

## 2019-02-02 DIAGNOSIS — Z886 Allergy status to analgesic agent status: Secondary | ICD-10-CM | POA: Diagnosis not present

## 2019-02-02 DIAGNOSIS — I340 Nonrheumatic mitral (valve) insufficiency: Secondary | ICD-10-CM

## 2019-02-02 DIAGNOSIS — Z66 Do not resuscitate: Secondary | ICD-10-CM | POA: Diagnosis not present

## 2019-02-02 DIAGNOSIS — Z87891 Personal history of nicotine dependence: Secondary | ICD-10-CM | POA: Diagnosis not present

## 2019-02-02 DIAGNOSIS — Z79899 Other long term (current) drug therapy: Secondary | ICD-10-CM | POA: Diagnosis not present

## 2019-02-02 DIAGNOSIS — I130 Hypertensive heart and chronic kidney disease with heart failure and stage 1 through stage 4 chronic kidney disease, or unspecified chronic kidney disease: Secondary | ICD-10-CM | POA: Diagnosis not present

## 2019-02-02 DIAGNOSIS — Z7951 Long term (current) use of inhaled steroids: Secondary | ICD-10-CM | POA: Diagnosis not present

## 2019-02-02 DIAGNOSIS — Z7902 Long term (current) use of antithrombotics/antiplatelets: Secondary | ICD-10-CM | POA: Diagnosis not present

## 2019-02-02 DIAGNOSIS — E1122 Type 2 diabetes mellitus with diabetic chronic kidney disease: Secondary | ICD-10-CM | POA: Diagnosis not present

## 2019-02-02 DIAGNOSIS — Z951 Presence of aortocoronary bypass graft: Secondary | ICD-10-CM | POA: Diagnosis not present

## 2019-02-02 DIAGNOSIS — J441 Chronic obstructive pulmonary disease with (acute) exacerbation: Secondary | ICD-10-CM | POA: Diagnosis present

## 2019-02-02 DIAGNOSIS — Z794 Long term (current) use of insulin: Secondary | ICD-10-CM | POA: Diagnosis not present

## 2019-02-02 DIAGNOSIS — F329 Major depressive disorder, single episode, unspecified: Secondary | ICD-10-CM | POA: Diagnosis not present

## 2019-02-02 DIAGNOSIS — N189 Chronic kidney disease, unspecified: Secondary | ICD-10-CM | POA: Diagnosis not present

## 2019-02-02 LAB — BASIC METABOLIC PANEL
Anion gap: 12 (ref 5–15)
BUN: 24 mg/dL — ABNORMAL HIGH (ref 8–23)
CO2: 23 mmol/L (ref 22–32)
Calcium: 9.1 mg/dL (ref 8.9–10.3)
Chloride: 104 mmol/L (ref 98–111)
Creatinine, Ser: 1.12 mg/dL — ABNORMAL HIGH (ref 0.44–1.00)
GFR calc Af Amer: 57 mL/min — ABNORMAL LOW (ref >60)
GFR calc non Af Amer: 49 mL/min — ABNORMAL LOW (ref >60)
Glucose, Bld: 332 mg/dL — ABNORMAL HIGH (ref 70–99)
Potassium: 3.3 mmol/L — ABNORMAL LOW (ref 3.5–5.1)
Sodium: 139 mmol/L (ref 135–145)

## 2019-02-02 LAB — GLUCOSE, CAPILLARY
Glucose-Capillary: 211 mg/dL — ABNORMAL HIGH (ref 70–99)
Glucose-Capillary: 372 mg/dL — ABNORMAL HIGH (ref 70–99)
Glucose-Capillary: 487 mg/dL — ABNORMAL HIGH (ref 70–99)
Glucose-Capillary: 521 mg/dL (ref 70–99)
Glucose-Capillary: 407 mg/dL — ABNORMAL HIGH (ref 70–99)
Glucose-Capillary: 393 mg/dL — ABNORMAL HIGH (ref 70–99)

## 2019-02-02 LAB — CBC
HCT: 33 % — ABNORMAL LOW (ref 36.0–46.0)
Hemoglobin: 10.3 g/dL — ABNORMAL LOW (ref 12.0–15.0)
MCH: 26.8 pg (ref 26.0–34.0)
MCHC: 31.2 g/dL (ref 30.0–36.0)
MCV: 85.7 fL (ref 80.0–100.0)
Platelets: 237 10*3/uL (ref 150–400)
RBC: 3.85 MIL/uL — ABNORMAL LOW (ref 3.87–5.11)
RDW: 16.2 % — ABNORMAL HIGH (ref 11.5–15.5)
WBC: 7.9 10*3/uL (ref 4.0–10.5)
nRBC: 0 % (ref 0.0–0.2)

## 2019-02-02 MED ORDER — INSULIN GLARGINE 100 UNIT/ML ~~LOC~~ SOLN
20.0000 [IU] | Freq: Two times a day (BID) | SUBCUTANEOUS | Status: DC
  Administered 2019-02-02: 21:00:00 20 [IU] via SUBCUTANEOUS
  Filled 2019-02-02 (×2): qty 0.2

## 2019-02-02 MED ORDER — ACETAMINOPHEN 325 MG PO TABS: 650.0000 mg | ORAL_TABLET | Freq: Four times a day (QID) | ORAL | Status: DC | PRN

## 2019-02-02 MED ORDER — CARVEDILOL 6.25 MG PO TABS
3.1250 mg | ORAL_TABLET | Freq: Two times a day (BID) | ORAL | Status: DC
  Administered 2019-02-02 – 2019-02-03 (×3): 3.125 mg via ORAL
  Filled 2019-02-02 (×4): qty 1

## 2019-02-02 MED ORDER — MOMETASONE FURO-FORMOTEROL FUM 200-5 MCG/ACT IN AERO
2.0000 | INHALATION_SPRAY | Freq: Two times a day (BID) | RESPIRATORY_TRACT | Status: DC
  Administered 2019-02-02 – 2019-02-03 (×3): 2 via RESPIRATORY_TRACT
  Filled 2019-02-02: qty 8.8

## 2019-02-02 MED ORDER — ONDANSETRON HCL 4 MG/2ML IJ SOLN: 4.0000 mg | Freq: Four times a day (QID) | INTRAMUSCULAR | Status: DC | PRN

## 2019-02-02 MED ORDER — METHYLPREDNISOLONE SODIUM SUCC 125 MG IJ SOLR
60.0000 mg | Freq: Two times a day (BID) | INTRAMUSCULAR | Status: DC
  Administered 2019-02-02 – 2019-02-03 (×2): 60 mg via INTRAVENOUS
  Filled 2019-02-02 (×2): qty 2

## 2019-02-02 MED ORDER — ATORVASTATIN CALCIUM 20 MG PO TABS
40.0000 mg | ORAL_TABLET | Freq: Every day | ORAL | Status: DC
  Administered 2019-02-02: 40 mg via ORAL
  Filled 2019-02-02: qty 2

## 2019-02-02 MED ORDER — INSULIN ASPART 100 UNIT/ML ~~LOC~~ SOLN
15.0000 [IU] | Freq: Once | SUBCUTANEOUS | Status: AC
  Administered 2019-02-02: 13:00:00 15 [IU] via SUBCUTANEOUS
  Filled 2019-02-02: qty 1

## 2019-02-02 MED ORDER — AMLODIPINE BESYLATE 10 MG PO TABS: 10.0000 mg | ORAL_TABLET | Freq: Every day | ORAL | Status: DC

## 2019-02-02 MED ORDER — FAMOTIDINE 20 MG PO TABS
20.0000 mg | ORAL_TABLET | Freq: Every day | ORAL | Status: DC
  Administered 2019-02-02: 21:00:00 20 mg via ORAL
  Filled 2019-02-02: qty 1

## 2019-02-02 MED ORDER — LEVALBUTEROL HCL 0.63 MG/3ML IN NEBU
0.6300 mg | INHALATION_SOLUTION | Freq: Once | RESPIRATORY_TRACT | Status: AC
  Administered 2019-02-02: 0.63 mg via RESPIRATORY_TRACT
  Filled 2019-02-02: qty 3

## 2019-02-02 MED ORDER — ALPRAZOLAM 0.5 MG PO TABS
0.5000 mg | ORAL_TABLET | Freq: Once | ORAL | Status: AC
  Administered 2019-02-02: 21:00:00 0.5 mg via ORAL
  Filled 2019-02-02: qty 1

## 2019-02-02 MED ORDER — LOSARTAN POTASSIUM 50 MG PO TABS
50.0000 mg | ORAL_TABLET | Freq: Every day | ORAL | Status: DC
  Administered 2019-02-02 – 2019-02-03 (×2): 50 mg via ORAL
  Filled 2019-02-02 (×2): qty 1

## 2019-02-02 MED ORDER — INSULIN ASPART 100 UNIT/ML ~~LOC~~ SOLN
15.0000 [IU] | Freq: Once | SUBCUTANEOUS | Status: AC
  Administered 2019-02-02: 18:00:00 15 [IU] via SUBCUTANEOUS
  Filled 2019-02-02: qty 1

## 2019-02-02 MED ORDER — MONTELUKAST SODIUM 10 MG PO TABS
10.0000 mg | ORAL_TABLET | Freq: Every day | ORAL | Status: DC
  Administered 2019-02-02 – 2019-02-03 (×2): 10 mg via ORAL
  Filled 2019-02-02 (×2): qty 1

## 2019-02-02 MED ORDER — INSULIN GLARGINE 100 UNIT/ML ~~LOC~~ SOLN
20.0000 [IU] | Freq: Every day | SUBCUTANEOUS | Status: DC
  Filled 2019-02-02: qty 0.2

## 2019-02-02 MED ORDER — METHYLPREDNISOLONE SODIUM SUCC 125 MG IJ SOLR
60.0000 mg | Freq: Four times a day (QID) | INTRAMUSCULAR | Status: DC
  Administered 2019-02-02 (×2): 60 mg via INTRAVENOUS
  Filled 2019-02-02 (×2): qty 2

## 2019-02-02 MED ORDER — POTASSIUM CHLORIDE 20 MEQ PO PACK
40.0000 meq | PACK | Freq: Once | ORAL | Status: AC
  Administered 2019-02-02: 40 meq via ORAL
  Filled 2019-02-02: qty 2

## 2019-02-02 MED ORDER — INSULIN ASPART 100 UNIT/ML ~~LOC~~ SOLN
0.0000 [IU] | Freq: Three times a day (TID) | SUBCUTANEOUS | Status: DC
  Administered 2019-02-02: 08:00:00 15 [IU] via SUBCUTANEOUS
  Administered 2019-02-03: 5 [IU] via SUBCUTANEOUS
  Administered 2019-02-03: 15 [IU] via SUBCUTANEOUS
  Administered 2019-02-03: 8 [IU] via SUBCUTANEOUS
  Filled 2019-02-02 (×4): qty 1

## 2019-02-02 MED ORDER — INSULIN ASPART 100 UNIT/ML ~~LOC~~ SOLN
0.0000 [IU] | Freq: Every day | SUBCUTANEOUS | Status: DC
  Administered 2019-02-02: 21:00:00 5 [IU] via SUBCUTANEOUS
  Filled 2019-02-02: qty 1

## 2019-02-02 MED ORDER — ACETAMINOPHEN 650 MG RE SUPP: 650.0000 mg | Freq: Four times a day (QID) | RECTAL | Status: DC | PRN

## 2019-02-02 MED ORDER — TIOTROPIUM BROMIDE MONOHYDRATE 18 MCG IN CAPS
18.0000 ug | ORAL_CAPSULE | Freq: Every day | RESPIRATORY_TRACT | Status: DC
  Administered 2019-02-02 – 2019-02-03 (×2): 18 ug via RESPIRATORY_TRACT
  Filled 2019-02-02: qty 5

## 2019-02-02 MED ORDER — LEVETIRACETAM 750 MG PO TABS
750.0000 mg | ORAL_TABLET | Freq: Two times a day (BID) | ORAL | Status: DC
  Administered 2019-02-02 – 2019-02-03 (×3): 750 mg via ORAL
  Filled 2019-02-02 (×5): qty 1

## 2019-02-02 MED ORDER — LOSARTAN POTASSIUM 50 MG PO TABS: 100.0000 mg | ORAL_TABLET | Freq: Every day | ORAL | Status: DC

## 2019-02-02 MED ORDER — ENOXAPARIN SODIUM 40 MG/0.4ML ~~LOC~~ SOLN
40.0000 mg | Freq: Two times a day (BID) | SUBCUTANEOUS | Status: DC
  Administered 2019-02-02 – 2019-02-03 (×3): 40 mg via SUBCUTANEOUS
  Filled 2019-02-02 (×3): qty 0.4

## 2019-02-02 MED ORDER — AZITHROMYCIN 500 MG PO TABS
500.0000 mg | ORAL_TABLET | Freq: Every day | ORAL | Status: DC
  Administered 2019-02-02 (×2): 500 mg via ORAL
  Filled 2019-02-02 (×3): qty 1
  Filled 2019-02-02 (×2): qty 2

## 2019-02-02 MED ORDER — CLOPIDOGREL BISULFATE 75 MG PO TABS
75.0000 mg | ORAL_TABLET | Freq: Every day | ORAL | Status: DC
  Administered 2019-02-02 – 2019-02-03 (×2): 75 mg via ORAL
  Filled 2019-02-02 (×2): qty 1

## 2019-02-02 MED ORDER — OXYCODONE HCL 5 MG PO TABS
5.0000 mg | ORAL_TABLET | ORAL | Status: DC | PRN
  Administered 2019-02-02 (×3): 5 mg via ORAL
  Filled 2019-02-02 (×3): qty 1

## 2019-02-02 MED ORDER — TRAZODONE HCL 50 MG PO TABS
50.0000 mg | ORAL_TABLET | Freq: Every day | ORAL | Status: DC
  Administered 2019-02-02: 50 mg via ORAL
  Filled 2019-02-02: qty 1

## 2019-02-02 MED ORDER — IPRATROPIUM-ALBUTEROL 0.5-2.5 (3) MG/3ML IN SOLN
3.0000 mL | Freq: Four times a day (QID) | RESPIRATORY_TRACT | Status: DC | PRN
  Administered 2019-02-02: 18:00:00 3 mL via RESPIRATORY_TRACT
  Filled 2019-02-02: qty 3

## 2019-02-02 MED ORDER — ISOSORBIDE MONONITRATE ER 30 MG PO TB24
30.0000 mg | ORAL_TABLET | Freq: Two times a day (BID) | ORAL | Status: DC
  Administered 2019-02-02 – 2019-02-03 (×3): 30 mg via ORAL
  Filled 2019-02-02 (×3): qty 1

## 2019-02-02 MED ORDER — ONDANSETRON HCL 4 MG PO TABS: 4.0000 mg | ORAL_TABLET | Freq: Four times a day (QID) | ORAL | Status: DC | PRN

## 2019-02-02 MED ORDER — SERTRALINE HCL 50 MG PO TABS
50.0000 mg | ORAL_TABLET | Freq: Every day | ORAL | Status: DC
  Administered 2019-02-02 – 2019-02-03 (×2): 50 mg via ORAL
  Filled 2019-02-02 (×2): qty 1

## 2019-02-02 NOTE — Progress Notes (Signed)
Inpatient Diabetes Program Recommendations  AACE/ADA: New Consensus Statement on Inpatient Glycemic Control (2015)  Target Ranges:  Prepandial:   less than 140 mg/dL      Peak postprandial:   less than 180 mg/dL (1-2 hours)      Critically ill patients:  140 - 180 mg/dL   Results for Tina Hayes, Tina Hayes (MRN [id number]) as of 02/02/2019 07:59  Ref. Range 02/02/2019 02:20 02/02/2019 07:44  Glucose-Capillary Latest Ref Range: 70 - 99 mg/dL 211 (H) 372 (H)   Results for Tina Hayes, Tina Hayes (MRN [id number]) as of 02/02/2019 07:59  Ref. Range 01/18/2019 05:25  Hemoglobin A1C Latest Ref Range: 4.8 - 5.6 % 6.5 (H)    Admit with: SOB/ COPD  History: DM, COPD, CHF  Home DM Meds: Lantus 30 units AM/ 40 units PM       Novolog 10 units TID  Current Orders: Novolog Moderate Correction Scale/ SSI (0-15 units) TID AC + HS     Just d/c'd home after hospital stay for COPD on 01/19/2019.  Patient given 125 mg Solumedrol X 1 dose yesterday at 9pm and then started on Solumedrol 60 mg Q6 hours.  CBG 372 this AM.  Patient takes Lantus at home.    MD- Since patient placed on IV Steroids, please consider placing orders to restart her home doses of Lantus insulin:  Lantus 30 units AM and 40 units PM  Please start this AM     --Will follow patient during hospitalization--  Wyn Quaker RN, MSN, CDE Diabetes Coordinator Inpatient Glycemic Control Team Team Pager: [phone] (8a-5p)

## 2019-02-02 NOTE — Progress Notes (Signed)
[place] [facility] [place] NAME: Tina Hayes    MR#:  [id number]  DATE OF BIRTH:  1946/02/05  SUBJECTIVE:  CHIEF COMPLAINT:   Chief Complaint  Patient presents with  . Shortness of Breath   Came with SOB, readmit for COPD. Feels little better today.  REVIEW OF SYSTEMS:  CONSTITUTIONAL: No fever, fatigue or weakness.  EYES: No blurred or double vision.  EARS, NOSE, AND THROAT: No tinnitus or ear pain.  RESPIRATORY: No cough, have shortness of breath, wheezing ,no hemoptysis.  CARDIOVASCULAR: No chest pain, orthopnea, edema.  GASTROINTESTINAL: No nausea, vomiting, diarrhea or abdominal pain.  GENITOURINARY: No dysuria, hematuria.  ENDOCRINE: No polyuria, nocturia,  HEMATOLOGY: No anemia, easy bruising or bleeding SKIN: No rash or lesion. MUSCULOSKELETAL: No joint pain or arthritis.   NEUROLOGIC: No tingling, numbness, weakness.  PSYCHIATRY: No anxiety or depression.   ROS  DRUG ALLERGIES:   Allergies  Allergen Reactions  . Aspirin Anaphylaxis    VITALS:  Blood pressure (!) 147/61, pulse 72, temperature 98.2 F (36.8 C), temperature source Oral, resp. rate 16, height 5\' 11"  (1.803 m), weight 135.5 kg, SpO2 100 %.  PHYSICAL EXAMINATION:  GENERAL:  73 y.o.-year-old patient lying in the bed with no acute distress.  EYES: Pupils equal, round, reactive to light and accommodation. No scleral icterus. Extraocular muscles intact.  HEENT: Head atraumatic, normocephalic. Oropharynx and nasopharynx clear.  NECK:  Supple, no jugular venous distention. No thyroid enlargement, no tenderness.  LUNGS: Normal breath sounds bilaterally, some wheezing, no crepitation. No use of accessory muscles of respiration.  CARDIOVASCULAR: S1, S2 normal. No murmurs, rubs, or gallops.  ABDOMEN: Soft, nontender, nondistended. Bowel sounds present. No organomegaly or mass.  EXTREMITIES: No pedal edema, cyanosis, or clubbing.  NEUROLOGIC: Cranial nerves II through XII  are intact. Muscle strength 5/5 in all extremities. Sensation intact. Gait not checked.  PSYCHIATRIC: The patient is alert and oriented x 3.  SKIN: No obvious rash, lesion, or ulcer.   Physical Exam LABORATORY PANEL:   CBC Recent Labs  Lab 02/02/19 0334  WBC 7.9  HGB 10.3*  HCT 33.0*  PLT 237   ------------------------------------------------------------------------------------------------------------------  Chemistries  Recent Labs  Lab 02/01/19 2055 02/02/19 0334  NA 141 139  K 3.9 3.3*  CL 104 104  CO2 24 23  GLUCOSE 101* 332*  BUN 23 24*  CREATININE 1.24* 1.12*  CALCIUM 9.1 9.1  AST 25  --   ALT 12  --   ALKPHOS 163*  --   BILITOT 0.8  --    ------------------------------------------------------------------------------------------------------------------  Cardiac Enzymes No results for input(s): TROPONINI in the last 168 hours. ------------------------------------------------------------------------------------------------------------------  RADIOLOGY:  Dg Chest Portable 1 View  Result Date: 02/01/2019 CLINICAL DATA:  Shortness of breath EXAM: PORTABLE CHEST 1 VIEW COMPARISON:  01/17/2019, 03/16/2017 FINDINGS: Post sternotomy changes. Cardiomegaly. Diffuse interstitial opacity appears chronic. No consolidation or effusion. No pneumothorax. Moderate to large hiatal hernia IMPRESSION: No active disease. Diffuse chronic appearing interstitial opacity. Hiatal hernia Electronically Signed   By: Donavan Foil M.D.   On: 02/01/2019 21:13    ASSESSMENT AND PLAN:   Principal Problem:   COPD with acute exacerbation (HCC) Active Problems:   Coronary artery disease of native artery of native heart with stable angina pectoris (HCC)   Essential hypertension   Diabetes mellitus (HCC)   Chronic diastolic congestive heart failure (HCC)   * COPD with acute exacerbation (HCC) -IV Solu-Medrol, azithromycin, nebulizers, PRN antitussive, continue home meds  *  Coronary  artery disease of native artery of native heart with stable angina pectoris ([place]) -continue home meds *  Essential hypertension -home dose antihypertensives *  Diabetes mellitus ([place]) -sliding scale insulin, Added lantus.  *  Chronic diastolic congestive heart failure ([place]) -continue home medications   All the records are reviewed and case discussed with Care Management/Social Workerr. Management plans discussed with the patient, family and they are in agreement.  CODE STATUS: DNR  TOTAL TIME TAKING CARE OF THIS PATIENT: 35 minutes.     POSSIBLE D/C IN 1-2 DAYS, DEPENDING ON CLINICAL CONDITION.   Vaughan Basta M.D on 02/02/2019   Between 7am to 6pm - Pager - [phone]  After 6pm go to [email or web address] - password [email or web address] [facility]  Office  [phone]  CC: Primary care physician; Herminio Commons, MD  Note: This dictation was prepared with Dragon dictation along with smaller phrase technology. Any transcriptional errors that result from this process are unintentional.

## 2019-02-02 NOTE — H&P (Signed)
[facility] [place] NAME: Tina Hayes    MR#:  [id number]  DATE OF BIRTH:  26-May-1945  DATE OF ADMISSION:  02/01/2019  PRIMARY CARE PHYSICIAN: Soles, Howell Rucks, MD   REQUESTING/REFERRING PHYSICIAN: Ellender Hose, MD  CHIEF COMPLAINT:   Chief Complaint  Patient presents with  . Shortness of Breath    HISTORY OF PRESENT ILLNESS:  Tina Hayes  is a 73 y.o. female who presents with chief complaint as above.  Patient presents the ED with a complaint of shortness of breath and wheezing.  She states that she was recently hospitalized here for several days for COPD exacerbation.  She went home for about 4 - 5 days.  She states that while she was finishing her steroid taper she felt good, but the day after she finished taking it she developed shortness of breath again.  She came in today because this was getting worse.  Work-up here is consistent with COPD exacerbation still.  Hospitalist called for admission  PAST MEDICAL HISTORY:   Past Medical History:  Diagnosis Date  . (HFpEF) heart failure with preserved ejection fraction ([place])    a. 05/2016 Echo: EF 60-65%, mild to mod LVH, Gr1 DD, mild MR, mildly dil LA, mod TR, mildly to mod increased PASP.  [name] Acute diastolic heart failure ([place]) 01/27/2017  . Anxiety   . Chest pain 06/16/2016  . CHF (congestive heart failure) ([place])   . Chronic back pain   . Chronic diastolic congestive heart failure ([place]) 02/13/2017  . COPD (chronic obstructive pulmonary disease) ([place])   . Coronary artery disease    a. s/p remote PCI x 5;  b. 2006 s/p CABG x 3 ([place], [place]); b. 05/2016 MV: attenuation corrected images w/o ischemia or wma-->Med rx.  . Coronary artery disease of native artery of native heart with stable angina pectoris ([place]) 06/17/2016  . Depression   . Diabetes mellitus without complication ([place])   . Essential hypertension 06/30/2016  . Heart attack ([place])    Total of 3 per  pt.  . Hypertension   . Hypertensive urgency 06/03/2015  . Seizure ([place])   . Seizures ([place])      PAST SURGICAL HISTORY:   Past Surgical History:  Procedure Laterality Date  . ABDOMINAL HYSTERECTOMY    . ANKLE SURGERY Right   . CATARACT EXTRACTION W/ INTRAOCULAR LENS  IMPLANT, BILATERAL    . COLONOSCOPY WITH PROPOFOL N/A 05/04/2017   Procedure: COLONOSCOPY WITH PROPOFOL;  Surgeon: [name], MD;  Location: [facility] ENDOSCOPY;  Service: Endoscopy;  Laterality: N/A;  . CORONARY ANGIOPLASTY    . CORONARY ARTERY BYPASS GRAFT     TRIPLE BYPASS  . ESOPHAGOGASTRODUODENOSCOPY (EGD) WITH PROPOFOL N/A 05/04/2017   Procedure: ESOPHAGOGASTRODUODENOSCOPY (EGD) WITH PROPOFOL;  Surgeon: [name], MD;  Location: [facility] ENDOSCOPY;  Service: Endoscopy;  Laterality: N/A;  . EXCISION MASS LOWER EXTREMETIES Right 03/02/2018   Procedure: EXCISION SOFT TISSUE MASS FROM MEDIAL ASPECT OF RIGHT ANKLE;  Surgeon: [name], MD;  Location: [facility] ORS;  Service: Orthopedics;  Laterality: Right;  . FRACTURE SURGERY    . KNEE SURGERY Right   . MOUTH SURGERY Left 07/14/2017     SOCIAL HISTORY:   Social History   Tobacco Use  . Smoking status: Former Smoker    Packs/day: 0.25    Years: 20.00    Pack years: 5.00    Types: Cigarettes    Quit date: 07/01/2006  Years since quitting: 12.6  . Smokeless tobacco: Never Used  Substance Use Topics  . Alcohol use: No    Alcohol/week: 0.0 standard drinks     FAMILY HISTORY:   Family History  Problem Relation Age of Onset  . Diabetes Other   . Hypertension Other   . Diabetes Mother   . Heart failure Mother   . Heart disease Mother   . Heart attack Mother   . Stroke Mother   . Depression Mother   . Hypertension Mother   . Cancer Sister        brain  . Hypertension Sister   . Diabetes Brother   . Hypertension Brother   . Heart failure Sister   . Heart attack Sister   . SIDS Sister      DRUG ALLERGIES:   Allergies  Allergen Reactions   . Aspirin Anaphylaxis    MEDICATIONS AT HOME:   Prior to Admission medications   Medication Sig Start Date End Date Taking? Authorizing Provider  albuterol (PROVENTIL HFA;VENTOLIN HFA) 108 (90 Base) MCG/ACT inhaler Inhale 2 puffs into the lungs every 6 (six) hours as needed for wheezing or shortness of breath. 08/30/15  Yes Epifanio Lesches, MD  albuterol (PROVENTIL) (2.5 MG/3ML) 0.083% nebulizer solution Take 2.5 mg every 4 (four) hours as needed by nebulization for wheezing or shortness of breath.   Yes [provider]  amLODipine (NORVASC) 10 MG tablet Take 1 tablet (10 mg total) by mouth daily. 08/17/18  Yes Minna Merritts, MD  atorvastatin (LIPITOR) 40 MG tablet Take 1 tablet (40 mg total) by mouth at bedtime. 08/17/18  Yes Gollan, Kathlene November, MD  Calcium Carbonate-Vitamin D3 (CALCIUM 600-D) 600-400 MG-UNIT TABS Take 1 tablet by mouth daily.    Yes [provider]  carvedilol (COREG) 3.125 MG tablet Take 1 tablet (3.125 mg total) by mouth 2 (two) times daily with a meal. 08/17/18  Yes Gollan, Kathlene November, MD  cetirizine (ZYRTEC) 10 MG tablet Take 10 mg by mouth daily.   Yes [provider]  clopidogrel (PLAVIX) 75 MG tablet Take 1 tablet (75 mg total) by mouth daily. 08/17/18  Yes Gollan, Kathlene November, MD  COMBIVENT RESPIMAT 20-100 MCG/ACT AERS respimat Inhale 1 puff into the lungs 4 (four) times daily as needed for wheezing.  07/30/18  Yes [provider]  famotidine (PEPCID) 20 MG tablet Take 20 mg by mouth at bedtime.   Yes [provider]  HYDROcodone-acetaminophen (NORCO) 7.5-325 MG tablet Take 1 tablet by mouth 3 (three) times daily. 02/05/19 03/07/19 Yes Molli Barrows, MD  insulin aspart (NOVOLOG) 100 UNIT/ML injection Inject 10 Units into the skin 3 (three) times daily.    Yes [provider]  insulin glargine (LANTUS) 100 UNIT/ML injection Inject 30-40 Units into the skin See admin instructions. Inject 30 units in the morning at 40  units at bedtime.   Yes [provider]  isosorbide mononitrate (IMDUR) 30 MG 24 hr tablet Take 1 tablet (30 mg total) by mouth 2 (two) times daily. 08/17/18  Yes Minna Merritts, MD  levETIRAcetam (KEPPRA) 750 MG tablet Take 750 mg by mouth 2 (two) times daily.   Yes [provider]  losartan (COZAAR) 100 MG tablet Take 1 tablet (100 mg total) by mouth daily. 08/17/18  Yes Gollan, Kathlene November, MD  meloxicam (MOBIC) 7.5 MG tablet Take 7.5 mg by mouth daily as needed for pain.    Yes [provider]  montelukast (SINGULAIR) 10 MG  tablet Take 10 mg by mouth daily.  11/05/17  Yes [provider]  nitroGLYCERIN (NITROSTAT) 0.4 MG SL tablet DISSOLVE ONE TABLET UNDER THE TONGUE EVERY 5 MINUTES AS NEEDED FOR CHEST PAIN.  DO NOT EXCEED A TOTAL OF 3 DOSES IN 15 MINUTES Patient taking differently: Place 0.4 mg under the tongue every 5 (five) minutes as needed for chest pain.  07/09/18  Yes Gollan, Kathlene November, MD  potassium chloride SA (K-DUR) 20 MEQ tablet Take 2 tablets (40 mEq total) by mouth 2 (two) times daily. Take extra 2 tablets (40 meq) if you take metolazone Patient taking differently: Take 40 mEq by mouth 2 (two) times daily.  01/05/19  Yes Hackney, Otila Kluver A, FNP  sertraline (ZOLOFT) 50 MG tablet Take 50 mg by mouth daily.   Yes [provider]  tiotropium (SPIRIVA) 18 MCG inhalation capsule Place 18 mcg into inhaler and inhale daily.   Yes [provider]  torsemide (DEMADEX) 20 MG tablet Take 60 mg by mouth 2 (two) times daily.   Yes [provider]  traZODone (DESYREL) 50 MG tablet Take 50 mg by mouth at bedtime.   Yes [provider]  B-D UF III MINI PEN NEEDLES 31G X 5 MM MISC USE WITH INSULIN PEN INJECTIONS TWICE DAILY 08/05/18   [provider]  HYDROcodone-acetaminophen (NORCO) 7.5-325 MG tablet Take 1 tablet by mouth 3 (three) times daily. 03/07/19 04/06/19  Molli Barrows, MD    REVIEW OF SYSTEMS:  Review of Systems   Constitutional: Negative for chills, fever, malaise/fatigue and weight loss.  HENT: Negative for ear pain, hearing loss and tinnitus.   Eyes: Negative for blurred vision, double vision, pain and redness.  Respiratory: Positive for cough, shortness of breath and wheezing. Negative for hemoptysis.   Cardiovascular: Negative for chest pain, palpitations, orthopnea and leg swelling.  Gastrointestinal: Negative for abdominal pain, constipation, diarrhea, nausea and vomiting.  Genitourinary: Negative for dysuria, frequency and hematuria.  Musculoskeletal: Negative for back pain, joint pain and neck pain.  Skin:       No acne, rash, or lesions  Neurological: Negative for dizziness, tremors, focal weakness and weakness.  Endo/Heme/Allergies: Negative for polydipsia. Does not bruise/bleed easily.  Psychiatric/Behavioral: Negative for depression. The patient is not nervous/anxious and does not have insomnia.      VITAL SIGNS:   Vitals:   02/01/19 2300 02/02/19 0000 02/02/19 0003 02/02/19 0030  BP: (!) 166/67 (!) 155/99  (!) 144/76  Pulse: 68 71 65 63  Resp: (!) 21 15 (!) 22 20  Temp:      TempSrc:      SpO2: 96% 100% 99% 99%  Weight:      Height:       Wt Readings from Last 3 Encounters:  02/01/19 136 kg  01/20/19 136.1 kg  01/19/19 135.6 kg    PHYSICAL EXAMINATION:  Physical Exam  Vitals reviewed. Constitutional: She is oriented to person, place, and time. She appears well-developed and well-nourished. No distress.  HENT:  Head: Normocephalic and atraumatic.  Mouth/Throat: Oropharynx is clear and moist.  Eyes: Pupils are equal, round, and reactive to light. Conjunctivae and EOM are normal. No scleral icterus.  Neck: Normal range of motion. Neck supple. No JVD present. No thyromegaly present.  Cardiovascular: Normal rate, regular rhythm and intact distal pulses. Exam reveals no gallop and no friction rub.  No murmur heard. Respiratory: She is in respiratory distress (Mild). She  has wheezes. She has no rales.  GI: Soft.  Bowel sounds are normal. She exhibits no distension. There is no abdominal tenderness.  Musculoskeletal: Normal range of motion.        General: No edema.     Comments: No arthritis, no gout  Lymphadenopathy:    She has no cervical adenopathy.  Neurological: She is alert and oriented to person, place, and time. No cranial nerve deficit.  No dysarthria, no aphasia  Skin: Skin is warm and dry. No rash noted. No erythema.  Psychiatric: She has a normal mood and affect. Her behavior is normal. Judgment and thought content normal.    LABORATORY PANEL:   CBC Recent Labs  Lab 02/01/19 2055  WBC 7.7  HGB 11.0*  HCT 35.3*  PLT 274   ------------------------------------------------------------------------------------------------------------------  Chemistries  Recent Labs  Lab 02/01/19 2055  NA 141  K 3.9  CL 104  CO2 24  GLUCOSE 101*  BUN 23  CREATININE 1.24*  CALCIUM 9.1  AST 25  ALT 12  ALKPHOS 163*  BILITOT 0.8   ------------------------------------------------------------------------------------------------------------------  Cardiac Enzymes No results for input(s): TROPONINI in the last 168 hours. ------------------------------------------------------------------------------------------------------------------  RADIOLOGY:  Dg Chest Portable 1 View  Result Date: 02/01/2019 CLINICAL DATA:  Shortness of breath EXAM: PORTABLE CHEST 1 VIEW COMPARISON:  01/17/2019, 03/16/2017 FINDINGS: Post sternotomy changes. Cardiomegaly. Diffuse interstitial opacity appears chronic. No consolidation or effusion. No pneumothorax. Moderate to large hiatal hernia IMPRESSION: No active disease. Diffuse chronic appearing interstitial opacity. Hiatal hernia Electronically Signed   By: Donavan Foil M.D.   On: 02/01/2019 21:13    EKG:   Orders placed or performed during the hospital encounter of 02/01/19  . ED EKG  . ED EKG  . EKG 12-Lead  . EKG  12-Lead    IMPRESSION AND PLAN:  Principal Problem:   COPD with acute exacerbation (HCC) -IV Solu-Medrol, azithromycin, nebulizers, PRN antitussive, continue home meds Active Problems:   Coronary artery disease of native artery of native heart with stable angina pectoris ([place]) -continue home meds   Essential hypertension -home dose antihypertensives   Diabetes mellitus ([place]) -sliding scale insulin   Chronic diastolic congestive heart failure ([place]) -continue home medications  Chart review performed and case discussed with ED provider. Labs, imaging and/or ECG reviewed by provider and discussed with patient/family. Management plans discussed with the patient and/or family.  COVID-19 status: Tested negative     DVT PROPHYLAXIS: SubQ lovenox   GI PROPHYLAXIS:  None  ADMISSION STATUS: Inpatient     CODE STATUS: Full Code Status History    Date Active Date Inactive Code Status Order ID Comments User Context   01/18/2019 0029 01/19/2019 1838 Full Code [id number]  Lance Coon, MD Inpatient   05/25/2018 2009 05/28/2018 2041 Full Code [id number]  Nicholes Mango, MD Inpatient   03/02/2018 1555 03/02/2018 2004 Full Code [id number]  [name], MD Inpatient   11/10/2017 2012 11/12/2017 1551 Full Code [id number]  Hillary Bow, MD ED   08/16/2017 1230 08/17/2017 1631 DNR [id number]  Hillary Bow, MD Inpatient   08/16/2017 0253 08/16/2017 1230 Full Code [id number]  Lance Coon, MD Inpatient   01/29/2017 1749 01/30/2017 2040 Full Code [id number]  Loletha Grayer, MD ED   06/16/2016 2036 06/18/2016 1749 Full Code [id number]  Epifanio Lesches, MD ED   08/28/2015 1034 08/30/2015 2023 Full Code [id number]  Dustin Flock, MD ED   06/03/2015 2238 06/05/2015 2044 Full Code [id number]  Brand Males, MD Inpatient   04/14/2015 1724 04/15/2015 2006 Full Code [id number]  Dustin Flock, MD Inpatient  Advance Care Planning Activity      TOTAL TIME TAKING CARE OF THIS PATIENT: 45 minutes.   This patient was  evaluated in the context of the global COVID-19 pandemic, which necessitated consideration that the patient might be at risk for infection with the SARS-CoV-2 virus that causes COVID-19. Institutional protocols and algorithms that pertain to the evaluation of patients at risk for COVID-19 are in a state of rapid change based on information released by regulatory bodies including the CDC and federal and state organizations. These policies and algorithms were followed to the best of this provider's knowledge to date during the patient's care at this facility.  Ethlyn Daniels 02/02/2019, 12:59 AM  [facility]  Office  [phone]  CC: Primary care physician; Herminio Commons, MD  Note:  This document was prepared using Dragon voice recognition software and may include unintentional dictation errors.

## 2019-02-02 NOTE — Consult Note (Signed)
Pulmonary Medicine          Date: 02/02/2019,   MRN# [id number] Tina Hayes 1945/11/21     AdmissionWeight: 136 kg                 CurrentWeight: 135.5 kg      CHIEF COMPLAINT:   Acute exacerbation of COPD   HISTORY OF PRESENT ILLNESS   This is a pleasant 73 year old female who came in with complaints of severe shortness of breath.  She reports having wheezing and dyspnea at home.  She was here previously for COPD exacerbation last week.  Patient states that after steroid taper finished she again had flares of her COPD with symptoms of chest tightness and worsening shortness of breath.  She was admitted to hospitalist service and pulmonary consultation was placed by Dr. Anselm Jungling to be evaluated for recurrent COPD exacerbation.  Her CBC is without leukocytosis however does have mild normocytic anemia.  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  She is COVID negative and her BNP was within reference range.  She had an x-ray done which shows diffuse interstitial opacification which appears to be chronic.  PAST MEDICAL HISTORY   Past Medical History:  Diagnosis Date   (HFpEF) heart failure with preserved ejection fraction ([employer])    a. 05/2016 Echo: EF 60-65%, mild to mod LVH, Gr1 DD, mild MR, mildly dil LA, mod TR, mildly to mod increased PASP.   Acute diastolic heart failure ([place]) 01/27/2017   Anxiety    Chest pain 06/16/2016   CHF (congestive heart failure) (HCC)    Chronic back pain    Chronic diastolic congestive heart failure ([place]) 02/13/2017   COPD (chronic obstructive pulmonary disease) ([place])    Coronary artery disease    a. s/p remote PCI x 5;  b. 2006 s/p CABG x 3 ([place]); b. 05/2016 MV: attenuation corrected images w/o ischemia or wma-->Med rx.   Coronary artery disease of native artery of native heart with stable angina pectoris ([place]) 06/17/2016   Depression     Diabetes mellitus without complication ([place])    Essential hypertension 06/30/2016   Heart attack ([place])    Total of 3 per pt.   Hypertension    Hypertensive urgency 06/03/2015   Seizure ([place])    Seizures ([place])      SURGICAL HISTORY   Past Surgical History:  Procedure Laterality Date   ABDOMINAL HYSTERECTOMY     ANKLE SURGERY Right    CATARACT EXTRACTION W/ INTRAOCULAR LENS  IMPLANT, BILATERAL     COLONOSCOPY WITH PROPOFOL N/A 05/04/2017   Procedure: COLONOSCOPY WITH PROPOFOL;  Surgeon: [name], MD;  Location[facility] ENDOSCOPY;  Service: Endoscopy;  Laterality: N/A;   CORONARY ANGIOPLASTY     CORONARY ARTERY BYPASS GRAFT     TRIPLE BYPASS   ESOPHAGOGASTRODUODENOSCOPY (EGD) WITH PROPOFOL N/A 05/04/2017   Procedure: ESOPHAGOGASTRODUODENOSCOPY (EGD) WITH PROPOFOL;  Surgeon: [name], MD;  Location: [facility] ENDOSCOPY;  Service: Endoscopy;  Laterality: N/A;   EXCISION MASS LOWER EXTREMETIES Right 03/02/2018   Procedure: EXCISION SOFT TISSUE MASS FROM MEDIAL ASPECT OF RIGHT ANKLE;  Surgeon: [name], MD;  Location: [facility] ORS;  Service: Orthopedics;  Laterality: Right;   FRACTURE SURGERY     KNEE SURGERY Right    MOUTH SURGERY Left 07/14/2017     FAMILY HISTORY   Family History  Problem Relation Age  of Onset   Diabetes Other    Hypertension Other    Diabetes Mother    Heart failure Mother    Heart disease Mother    Heart attack Mother    Stroke Mother    Depression Mother    Hypertension Mother    Cancer Sister        brain   Hypertension Sister    Diabetes Brother    Hypertension Brother    Heart failure Sister    Heart attack Sister    SIDS Sister      SOCIAL HISTORY   Social History   Tobacco Use   Smoking status: Former Smoker    Packs/day: 0.25    Years: 20.00    Pack years: 5.00    Types: Cigarettes    Quit date: 07/01/2006    Years since quitting: 12.6   Smokeless tobacco: Never Used  Substance Use Topics     Alcohol use: No    Alcohol/week: 0.0 standard drinks   Drug use: No     MEDICATIONS    Home Medication:    Current Medication:  Current Facility-Administered Medications:    acetaminophen (TYLENOL) tablet 650 mg, 650 mg, Oral, Q6H PRN **OR** acetaminophen (TYLENOL) suppository 650 mg, 650 mg, Rectal, Q6H PRN, Lance Coon, MD   atorvastatin (LIPITOR) tablet 40 mg, 40 mg, Oral, QHS, Lance Coon, MD   azithromycin [facility]) tablet 500 mg, 500 mg, Oral, Daily, Lance Coon, MD, 500 mg at 02/02/19 0249   carvedilol (COREG) tablet 3.125 mg, 3.125 mg, Oral, BID WC, Lance Coon, MD   clopidogrel (PLAVIX) tablet 75 mg, 75 mg, Oral, Daily, Lance Coon, MD, 75 mg at 02/02/19 0927   enoxaparin (LOVENOX) injection 40 mg, 40 mg, Subcutaneous, BID, Lance Coon, MD, 40 mg at 02/02/19 0931   famotidine (PEPCID) tablet 20 mg, 20 mg, Oral, QHS, Lance Coon, MD   insulin aspart (novoLOG) injection 0-15 Units, 0-15 Units, Subcutaneous, TID WC, Lance Coon, MD, 15 Units at 02/02/19 0813   insulin aspart (novoLOG) injection 0-5 Units, 0-5 Units, Subcutaneous, QHS, Lance Coon, MD   insulin aspart (novoLOG) injection 15 Units, 15 Units, Subcutaneous, Once, Vaughan Basta, MD   insulin glargine (LANTUS) injection 20 Units, 20 Units, Subcutaneous, BID, Vaughan Basta, MD   ipratropium-albuterol (DUONEB) 0.5-2.5 (3) MG/3ML nebulizer solution 3-6 mL, 3-6 mL, Inhalation, QID PRN, Lance Coon, MD   isosorbide mononitrate (IMDUR) 24 hr tablet 30 mg, 30 mg, Oral, BID, Lance Coon, MD, 30 mg at 02/02/19 [id number]   levETIRAcetam (KEPPRA) tablet 750 mg, 750 mg, Oral, BID, Lance Coon, MD, 750 mg at 02/02/19 0930   losartan (COZAAR) tablet 50 mg, 50 mg, Oral, Daily, Vaughan Basta, MD, 50 mg at 02/02/19 0930   methylPREDNISolone sodium succinate (SOLU-MEDROL) 125 mg/2 mL injection 60 mg, 60 mg, Intravenous, Q12H, Vaughan Basta, MD    mometasone-formoterol (DULERA) 200-5 MCG/ACT inhaler 2 puff, 2 puff, Inhalation, BID, Lance Coon, MD, 2 puff at 02/02/19 0810   montelukast (SINGULAIR) tablet 10 mg, 10 mg, Oral, Daily, Lance Coon, MD, 10 mg at 02/02/19 0927   ondansetron (ZOFRAN) tablet 4 mg, 4 mg, Oral, Q6H PRN **OR** ondansetron (ZOFRAN) injection 4 mg, 4 mg, Intravenous, Q6H PRN, Lance Coon, MD   oxyCODONE (Oxy IR/ROXICODONE) immediate release tablet 5 mg, 5 mg, Oral, Q4H PRN, Lance Coon, MD, 5 mg at 02/02/19 [id number]   sertraline (ZOLOFT) tablet 50 mg, 50 mg, Oral, Daily, Lance Coon, MD, 50 mg at 02/02/19 0927   tiotropium [facility])  inhalation capsule ([facility] use ONLY) 18 mcg, 18 mcg, Inhalation, Daily, Lance Coon, MD, 18 mcg at 02/02/19 [id number]   traZODone (DESYREL) tablet 50 mg, 50 mg, Oral, QHS, Lance Coon, MD    ALLERGIES   Aspirin     REVIEW OF SYSTEMS    Review of Systems:  Gen:  Denies  fever, sweats, chills weigh loss  HEENT: Denies blurred vision, double vision, ear pain, eye pain, hearing loss, nose bleeds, sore throat Cardiac:  No dizziness, chest pain or heaviness, chest tightness,edema Resp:   Denies cough or sputum porduction, shortness of breath,wheezing, hemoptysis,  Gi: Denies swallowing difficulty, stomach pain, nausea or vomiting, diarrhea, constipation, bowel incontinence Gu:  Denies bladder incontinence, burning urine Ext:   Denies Joint pain, stiffness or swelling Skin: Denies  skin rash, easy bruising or bleeding or hives Endoc:  Denies polyuria, polydipsia , polyphagia or weight change Psych:   Denies depression, insomnia or hallucinations   Other:  All other systems negative   VS: BP (!) 147/61 (BP Location: Left Arm)    Pulse 72    Temp 98.2 F (36.8 C) (Oral)    Resp 16    Ht 5\' 11"  (1.803 m)    Wt 135.5 kg    SpO2 100%    BMI 41.66 kg/m      PHYSICAL EXAM    GENERAL:NAD, no fevers, chills, no weakness no fatigue HEAD: Normocephalic, atraumatic.  EYES:  Pupils equal, round, reactive to light. Extraocular muscles intact. No scleral icterus.  MOUTH: Moist mucosal membrane. Dentition intact. No abscess noted.  EAR, NOSE, THROAT: Clear without exudates. No external lesions.  NECK: Supple. No thyromegaly. No nodules. No JVD.  PULMONARY: Mild rhonchorous breath sounds bilaterally. CARDIOVASCULAR: S1 and S2. Regular rate and rhythm. No murmurs, rubs, or gallops. No edema. Pedal pulses 2+ bilaterally.  GASTROINTESTINAL: Soft, nontender, nondistended. No masses. Positive bowel sounds. No hepatosplenomegaly.  MUSCULOSKELETAL: No swelling, clubbing, or edema. Range of motion full in all extremities.  NEUROLOGIC: Cranial nerves II through XII are intact. No gross focal neurological deficits. Sensation intact. Reflexes intact.  SKIN: No ulceration, lesions, rashes, or cyanosis. Skin warm and dry. Turgor intact.  PSYCHIATRIC: Mood, affect within normal limits. The patient is awake, alert and oriented x 3. Insight, judgment intact.       IMAGING    Dg Chest Portable 1 View  Result Date: 02/01/2019 CLINICAL DATA:  Shortness of breath EXAM: PORTABLE CHEST 1 VIEW COMPARISON:  01/17/2019, 03/16/2017 FINDINGS: Post sternotomy changes. Cardiomegaly. Diffuse interstitial opacity appears chronic. No consolidation or effusion. No pneumothorax. Moderate to large hiatal hernia IMPRESSION: No active disease. Diffuse chronic appearing interstitial opacity. Hiatal hernia Electronically Signed   By: Donavan Foil M.D.   On: 02/01/2019 21:13   Dg Chest Portable 1 View  Result Date: 01/17/2019 CLINICAL DATA:  Shortness of breath EXAM: PORTABLE CHEST 1 VIEW COMPARISON:  May 25, 2018 FINDINGS: There is mild scarring in the left base. Lungs elsewhere are clear. Heart is borderline enlarged with pulmonary vascularity normal. No adenopathy. There is a focal hiatal hernia. No bone lesions. IMPRESSION: Scarring left base. No edema or consolidation. Borderline cardiac  enlargement. Focal hiatal hernia. Status post coronary artery bypass grafting. Electronically Signed   By: Lowella Grip III M.D.   On: 01/17/2019 18:03     SPIROMETRY: FVC was 2.11 liters, 92% of predicted/Post 2.33, 101%, 10% Change FEV1 was 1.36, 74% of predicted/Post 1.72, 94%, 26% Change FEV1 ratio was 65/Post 74 FEF 25-75% liters  per second was 28% of predicted/Post 54%, 96% Change SMALL VOLUME NEBULIZER given 2.5 mg Albuterol for Post Spirometry   LUNG VOLUMES: TLC was 83% of predicted RV was 97% of predicted  DIFFUSION CAPACITY: DLCO was 43% of predicted DLCO/VA was 93% of predicted  FLOW VOLUME LOOP: Scooping of expiratory limb suggestive of obstructive physiology.    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 Anselm Jungling for allowing me to participate in the care of this patient.   Patient/Family are satisfied with care plan and all questions have been answered.  This document was prepared using Dragon voice recognition software and may include unintentional dictation errors.     Ottie Glazier, M.D.  Division of [place]

## 2019-02-02 NOTE — ED Notes (Signed)
Ambulated with patient to toilet, sats dropped to 91 on room air, respirations in the 30s

## 2019-02-02 NOTE — ED Notes (Signed)
ED TO INPATIENT HANDOFF REPORT  ED Nurse Name and Phone #: [place] Levels Name/Age/Gender Tina Hayes 73 y.o. female Room/Bed: ED14A/ED14A  Code Status   Code Status: Prior  Home/SNF/Other Home Patient oriented to: self, place, time and situation Is this baseline? Yes   Triage Complete: Triage complete  Chief Complaint Breathing Difficulties  Triage Note Patient arrives EMS from home with shortness of breath, just discharged after admitted for same. Says it feels like "COPD exac"    Allergies Allergies  Allergen Reactions  . Aspirin Anaphylaxis    Level of Care/Admitting Diagnosis ED Disposition    ED Disposition Condition [facility] Area: [place] [[id number]]  Level of Care: Med-Surg [16]  Covid Evaluation: Confirmed COVID Negative  Diagnosis: [facility] exacerbation [id number]  Admitting Physician: Lance Coon [id number]  Attending Physician: Lance Coon [phone]  Estimated length of stay: past midnight tomorrow  Certification:: I certify this patient will need inpatient services for at least 2 midnights  PT Class (Do Not Modify): Inpatient [101]  PT Acc Code (Do Not Modify): Private [1]       B Medical/Surgery History Past Medical History:  Diagnosis Date  . (HFpEF) heart failure with preserved ejection fraction ([place])    a. 05/2016 Echo: EF 60-65%, mild to mod LVH, Gr1 DD, mild MR, mildly dil LA, mod TR, mildly to mod increased PASP.  [name] Acute diastolic heart failure ([place]) 01/27/2017  . Anxiety   . Chest pain 06/16/2016  . CHF (congestive heart failure) ([place])   . Chronic back pain   . Chronic diastolic congestive heart failure ([place]) 02/13/2017  . COPD (chronic obstructive pulmonary disease) ([place])   . Coronary artery disease    a. s/p remote PCI x 5;  b. 2006 s/p CABG x 3 ([place]); b. 05/2016 MV: attenuation corrected images w/o ischemia or wma-->Med rx.  . Coronary artery  disease of native artery of native heart with stable angina pectoris ([place]) 06/17/2016  . Depression   . Diabetes mellitus without complication ([place])   . Essential hypertension 06/30/2016  . Heart attack ([place])    Total of 3 per pt.  . Hypertension   . Hypertensive urgency 06/03/2015  . Seizure ([place])   . Seizures ([place])    Past Surgical History:  Procedure Laterality Date  . ABDOMINAL HYSTERECTOMY    . ANKLE SURGERY Right   . CATARACT EXTRACTION W/ INTRAOCULAR LENS  IMPLANT, BILATERAL    . COLONOSCOPY WITH PROPOFOL N/A 05/04/2017   Procedure: COLONOSCOPY WITH PROPOFOL;  Surgeon: [name], MD;  Location: [facility] ENDOSCOPY;  Service: Endoscopy;  Laterality: N/A;  . CORONARY ANGIOPLASTY    . CORONARY ARTERY BYPASS GRAFT     TRIPLE BYPASS  . ESOPHAGOGASTRODUODENOSCOPY (EGD) WITH PROPOFOL N/A 05/04/2017   Procedure: ESOPHAGOGASTRODUODENOSCOPY (EGD) WITH PROPOFOL;  Surgeon: [name], MD;  [facility] ENDOSCOPY;  Service: Endoscopy;  Laterality: N/A;  . EXCISION MASS LOWER EXTREMETIES Right 03/02/2018   Procedure: EXCISION SOFT TISSUE MASS FROM MEDIAL ASPECT OF RIGHT ANKLE;  Surgeon: [name], MD;  Location: [facility] ORS;  Service: Orthopedics;  Laterality: Right;  . FRACTURE SURGERY    . KNEE SURGERY Right   . MOUTH SURGERY Left 07/14/2017     A IV Location/Drains/Wounds Patient Lines/Drains/Airways Status   Active Line/Drains/Airways    Name:   Placement date:   Placement time:   Site:   Days:   Peripheral IV  02/01/19 Right Antecubital   02/01/19    2100    Antecubital   1   Epidural Catheter 11/16/18   11/16/18    1503     78   Incision (Closed) 03/02/18 Leg Right   03/02/18    1431     337          Intake/Output Last 24 hours No intake or output data in the 24 hours ending 02/02/19 0100  Labs/Imaging Results for orders placed or performed during the hospital encounter of 02/01/19 (from the past 48 hour(s))  CBC with Differential     Status: Abnormal   Collection  Time: 02/01/19  8:55 PM  Result Value Ref Range   WBC 7.7 4.0 - 10.5 K/uL   RBC 4.10 3.87 - 5.11 MIL/uL   Hemoglobin 11.0 (L) 12.0 - 15.0 g/dL   HCT 35.3 (L) 36.0 - 46.0 %   MCV 86.1 80.0 - 100.0 fL   MCH 26.8 26.0 - 34.0 pg   MCHC 31.2 30.0 - 36.0 g/dL   RDW 16.4 (H) 11.5 - 15.5 %   Platelets 274 150 - 400 K/uL   nRBC 0.0 0.0 - 0.2 %   Neutrophils Relative % 55 %   Neutro Abs 4.2 1.7 - 7.7 K/uL   Lymphocytes Relative 33 %   Lymphs Abs 2.5 0.7 - 4.0 K/uL   Monocytes Relative 6 %   Monocytes Absolute 0.5 0.1 - 1.0 K/uL   Eosinophils Relative 4 %   Eosinophils Absolute 0.3 0.0 - 0.5 K/uL   Basophils Relative 1 %   Basophils Absolute 0.1 0.0 - 0.1 K/uL   Immature Granulocytes 1 %   Abs Immature Granulocytes 0.05 0.00 - 0.07 K/uL    Comment: Performed at [facility], [address] [place], [place] [address]  Comprehensive metabolic panel     Status: Abnormal   Collection Time: 02/01/19  8:55 PM  Result Value Ref Range   Sodium 141 135 - 145 mmol/L   Potassium 3.9 3.5 - 5.1 mmol/L    Comment: HEMOLYSIS AT THIS LEVEL MAY AFFECT RESULT   Chloride 104 98 - 111 mmol/L   CO2 24 22 - 32 mmol/L   Glucose, Bld 101 (H) 70 - 99 mg/dL   BUN 23 8 - 23 mg/dL   Creatinine, Ser 1.24 (H) 0.44 - 1.00 mg/dL   Calcium 9.1 8.9 - 10.3 mg/dL   Total Protein 7.7 6.5 - 8.1 g/dL   Albumin 3.9 3.5 - 5.0 g/dL   AST 25 15 - 41 U/L    Comment: HEMOLYSIS AT THIS LEVEL MAY AFFECT RESULT   ALT 12 0 - 44 U/L    Comment: HEMOLYSIS AT THIS LEVEL MAY AFFECT RESULT   Alkaline Phosphatase 163 (H) 38 - 126 U/L   Total Bilirubin 0.8 0.3 - 1.2 mg/dL    Comment: HEMOLYSIS AT THIS LEVEL MAY AFFECT RESULT   GFR calc non Af Amer 44 (L) >60 mL/min   GFR calc Af Amer 51 (L) >60 mL/min   Anion gap 13 5 - 15    Comment: Performed at [facility], [address] [place], [place] [address]  Brain natriuretic peptide     Status: None   Collection Time: 02/01/19  8:55 PM  Result Value Ref Range    B Natriuretic Peptide 64.0 0.0 - 100.0 pg/mL    Comment: Performed at [facility], [address] [place], [place] [address]  SARS Coronavirus 2 [facility] order,  Performed in [facility] hospital lab) Nasopharyngeal Nasopharyngeal Swab     Status: None   Collection Time: 02/01/19  8:55 PM   Specimen: Nasopharyngeal Swab  Result Value Ref Range   SARS Coronavirus 2 NEGATIVE NEGATIVE    Comment: (NOTE) If result is NEGATIVE SARS-CoV-2 target nucleic acids are NOT DETECTED. The SARS-CoV-2 RNA is generally detectable in upper and lower  respiratory specimens during the acute phase of infection. The lowest  concentration of SARS-CoV-2 viral copies this assay can detect is 250  copies / mL. A negative result does not preclude SARS-CoV-2 infection  and should not be used as the sole basis for treatment or other  patient management decisions.  A negative result may occur with  improper specimen collection / handling, submission of specimen other  than nasopharyngeal swab, presence of viral mutation(s) within the  areas targeted by this assay, and inadequate number of viral copies  (<250 copies / mL). A negative result must be combined with clinical  observations, patient history, and epidemiological information. If result is POSITIVE SARS-CoV-2 target nucleic acids are DETECTED. The SARS-CoV-2 RNA is generally detectable in upper and lower  respiratory specimens dur ing the acute phase of infection.  Positive  results are indicative of active infection with SARS-CoV-2.  Clinical  correlation with patient history and other diagnostic information is  necessary to determine patient infection status.  Positive results do  not rule out bacterial infection or co-infection with other viruses. If result is PRESUMPTIVE POSTIVE SARS-CoV-2 nucleic acids MAY BE PRESENT.   A presumptive positive result was obtained on the submitted specimen  and confirmed on repeat testing.  While 2019 novel  coronavirus  (SARS-CoV-2) nucleic acids may be present in the submitted sample  additional confirmatory testing may be necessary for epidemiological  and / or clinical management purposes  to differentiate between  SARS-CoV-2 and other Sarbecovirus currently known to infect humans.  If clinically indicated additional testing with an alternate test  methodology [id number]) is advised. The SARS-CoV-2 RNA is generally  detectable in upper and lower respiratory sp ecimens during the acute  phase of infection. The expected result is Negative. Fact Sheet for Patients:  StrictlyIdeas.no Fact Sheet for Healthcare Providers: BankingDealers.co.za This test is not yet approved or cleared by the [place] FDA and has been authorized for detection and/or diagnosis of SARS-CoV-2 by FDA under an Emergency Use Authorization (EUA).  This EUA will remain in effect (meaning this test can be used) for the duration of the COVID-19 declaration under Section 564(b)(1) of the Act, 21 U.S.C. section 360bbb-3(b)(1), unless the authorization is terminated or revoked sooner. Performed at [facility], [address] [place], [place] [address]    Dg Chest Portable 1 View  Result Date: 02/01/2019 CLINICAL DATA:  Shortness of breath EXAM: PORTABLE CHEST 1 VIEW COMPARISON:  01/17/2019, 03/16/2017 FINDINGS: Post sternotomy changes. Cardiomegaly. Diffuse interstitial opacity appears chronic. No consolidation or effusion. No pneumothorax. Moderate to large hiatal hernia IMPRESSION: No active disease. Diffuse chronic appearing interstitial opacity. Hiatal hernia Electronically Signed   By: Donavan Foil M.D.   On: 02/01/2019 21:13    Pending Labs [employer] (From admission, onward)    Start     Ordered   Signed and Held  CBC  (enoxaparin (LOVENOX)    CrCl >/= 30 ml/min)  Once,   R    Comments: Baseline for enoxaparin therapy IF NOT ALREADY DRAWN.  Notify  MD if PLT < 100 K.  Signed and Held   Signed and Held  Creatinine, serum  (enoxaparin (LOVENOX)    CrCl >/= 30 ml/min)  Once,   R    Comments: Baseline for enoxaparin therapy IF NOT ALREADY DRAWN.    Signed and Held   Signed and Held  Creatinine, serum  (enoxaparin (LOVENOX)    CrCl >/= 30 ml/min)  Weekly,   R    Comments: while on enoxaparin therapy    Signed and Held   Signed and Held  Basic metabolic panel  Tomorrow morning,   R     Signed and Held   Signed and Held  CBC  Tomorrow morning,   R     Signed and Held          Vitals/Pain Today's Vitals   02/01/19 2300 02/02/19 0000 02/02/19 0003 02/02/19 0030  BP: (!) 166/67 (!) 155/99  (!) 144/76  Pulse: 68 71 65 63  Resp: (!) 21 15 (!) 22 20  Temp:      TempSrc:      SpO2: 96% 100% 99% 99%  Weight:      Height:      PainSc:        Isolation Precautions No active isolations  Medications Medications  methylPREDNISolone sodium succinate (SOLU-MEDROL) 125 mg/2 mL injection 125 mg (125 mg Intravenous Given 02/01/19 2120)  albuterol (PROVENTIL) (2.5 MG/3ML) 0.083% nebulizer solution 5 mg (5 mg Nebulization Given 02/01/19 2121)  ipratropium (ATROVENT) nebulizer solution 0.5 mg (0.5 mg Nebulization Given 02/01/19 2121)  levalbuterol (XOPENEX) nebulizer solution 0.63 mg (0.63 mg Nebulization Given 02/02/19 0043)    Mobility walks with person assist Low fall risk   Focused Assessments Pulmonary Assessment Handoff:  Lung sounds: L Breath Sounds: Expiratory wheezes R Breath Sounds: Expiratory wheezes O2 Device: Room Air O2 Flow Rate (L/min): 2 L/min      R Recommendations: See Admitting Provider Note  Report given to:   Additional Notes:

## 2019-02-02 NOTE — Plan of Care (Signed)
Patient doing well today.  O2 has been weaned to room air while at rest.  Patient does still get short of breath with exertion.  She has been up in the room and has sat up in the chair for most of the afternoon.  No significant changes.

## 2019-02-02 NOTE — Progress Notes (Signed)
Family Meeting Note  Advance Directive:yes  Today a meeting took place with the Patient.  The following clinical team members were present during this meeting:MD  The following were discussed:Patient's diagnosis:COPD , Patient's progosis: Unable to determine and Goals for treatment: DNR  Additional follow-up to be provided: PMD, Pulmonologist.  Time spent during discussion:20 minutes  Vaughan Basta, MD

## 2019-02-03 DIAGNOSIS — J441 Chronic obstructive pulmonary disease with (acute) exacerbation: Secondary | ICD-10-CM | POA: Diagnosis not present

## 2019-02-03 LAB — GLUCOSE, CAPILLARY
Glucose-Capillary: 299 mg/dL — ABNORMAL HIGH (ref 70–99)
Glucose-Capillary: 419 mg/dL — ABNORMAL HIGH (ref 70–99)
Glucose-Capillary: 428 mg/dL — ABNORMAL HIGH (ref 70–99)
Glucose-Capillary: 238 mg/dL — ABNORMAL HIGH (ref 70–99)
Glucose-Capillary: 218 mg/dL — ABNORMAL HIGH (ref 70–99)

## 2019-02-03 LAB — ECHOCARDIOGRAM COMPLETE
Height: 71 in
Weight: 4779.2 oz

## 2019-02-03 MED ORDER — INSULIN GLARGINE 100 UNIT/ML ~~LOC~~ SOLN
25.0000 [IU] | Freq: Two times a day (BID) | SUBCUTANEOUS | Status: DC
  Administered 2019-02-03: 10:00:00 25 [IU] via SUBCUTANEOUS
  Filled 2019-02-03 (×3): qty 0.25

## 2019-02-03 MED ORDER — MOMETASONE FURO-FORMOTEROL FUM 200-5 MCG/ACT IN AERO: 2.0000 | INHALATION_SPRAY | Freq: Two times a day (BID) | RESPIRATORY_TRACT | 0 refills | Status: DC

## 2019-02-03 MED ORDER — AZITHROMYCIN 500 MG PO TABS: 500.0000 mg | ORAL_TABLET | Freq: Every day | ORAL | 0 refills | Status: AC

## 2019-02-03 MED ORDER — INSULIN GLARGINE 100 UNIT/ML ~~LOC~~ SOLN
10.0000 [IU] | Freq: Once | SUBCUTANEOUS | Status: AC
  Administered 2019-02-03: 10 [IU] via SUBCUTANEOUS
  Filled 2019-02-03: qty 0.1

## 2019-02-03 MED ORDER — PREDNISONE 10 MG (21) PO TBPK: ORAL_TABLET | ORAL | 0 refills | Status: DC

## 2019-02-03 NOTE — Progress Notes (Signed)
RN notified MD pt BG was 428, no new orders given. RN will cover pt with sliding scale insulin. RN will continue to assess and monitor pt.

## 2019-02-03 NOTE — Progress Notes (Signed)
MD ordered patient to be discharged home.  Discharge instructions were reviewed with the patient and she voiced understanding.  Follow-up appointment was made.  Prescriptions given to the patient.  IV was removed with catheter intact.  All patients questions were answered.  Patient left via wheelchair escorted by NT.

## 2019-02-03 NOTE — TOC Initial Note (Signed)
Transition of Care [facility]) - Initial/Assessment Note    Patient Details  Name: Tina Hayes MRN: [id number] Date of Birth: [birth year]  [facility]) CM/SW Contact:    Candie Chroman, LCSW Phone Number: 02/03/2019, 1:39 PM  Clinical Narrative: CSW met with patient. No supports at bedside. CSW introduced role and explained that discharge planning would be discussed. MD has placed orders for home health PT, RN, aide. Patient stated she is active with [facility]. CSW left voicemail for [place] representative to confirm. No further concerns. CSW encouraged patient to contact CSW as needed. CSW will continue to follow patient for support and facilitate return home today. She said her son will pick her up.                 Expected Discharge Plan: [place] Barriers to Discharge: Barriers Resolved   Patient Goals and CMS Choice   CMS Medicare.gov Compare Post Acute Care list provided to:: Patient    Expected Discharge Plan and Services Expected Discharge Plan: [place] Choice: [place] arrangements for the past 2 months: Single Family Home Expected Discharge Date: 02/03/19                         HH Arranged: RN, PT          Prior Living Arrangements/Services Living arrangements for the past 2 months: Single Family Home Lives with:: Adult Children Patient language and need for interpreter reviewed:: Yes Do you feel safe going back to the place where you live?: Yes      Need for Family Participation in Patient Care: Yes (Comment) Care giver support system in place?: Yes (comment) Current home services: DME, Home RN Criminal Activity/Legal Involvement Pertinent to Current Situation/Hospitalization: No - Comment as needed  Activities of Daily Living Home Assistive Devices/Equipment: Cane (specify quad or straight) ADL Screening (condition at time of admission) Patient's cognitive ability adequate to  safely complete daily activities?: Yes Is the patient deaf or have difficulty hearing?: No Does the patient have difficulty seeing, even when wearing glasses/contacts?: No Does the patient have difficulty concentrating, remembering, or making decisions?: No Patient able to express need for assistance with ADLs?: Yes Does the patient have difficulty dressing or bathing?: Yes Independently performs ADLs?: Yes (appropriate for developmental age) Does the patient have difficulty walking or climbing stairs?: Yes Weakness of Legs: None Weakness of Arms/Hands: None  Permission Sought/Granted Permission sought to share information with : Facility Art therapist granted to share information with : Yes, Verbal Permission Granted     Permission granted to share info w AGENCY: [facility]        Emotional Assessment Appearance:: Appears stated age Attitude/Demeanor/Rapport: Engaged, Gracious Affect (typically observed): Accepting, Appropriate, Calm, Pleasant Orientation: : Oriented to Self, Oriented to Place, Oriented to  Time, Oriented to Situation Alcohol / Substance Use: Never Used Psych Involvement: No (comment)  Admission diagnosis:  Breathing Difficulties Patient Active Problem List   Diagnosis Date Noted  . Occlusion and stenosis of vertebral artery 08/31/2018  . AKI (acute kidney injury) ([place]) 05/25/2018  . Seizures ([place]) 11/10/2017  . Mass of right lower leg 09/11/2017  . Dysphagia 04/23/2017  . Chronic diastolic congestive heart failure ([place]) 02/13/2017  . Acute diastolic heart failure ([place]) 01/27/2017  . Depression 12/11/2016  . Essential hypertension 06/30/2016  . Snoring 06/30/2016  . Diabetes mellitus ([place]) 06/30/2016  .  Coronary artery disease of native artery of native heart with stable angina pectoris ([place]) 06/17/2016  . Morbid obesity ([place]) 06/17/2016  . COPD (chronic obstructive pulmonary disease) ([place]) 08/28/2015  . COPD with acute exacerbation ([place])  04/14/2015   PCP:  Herminio Commons, MD Pharmacy:   [facility] [address]), [place] [address] [place] [address] Phone: [phone] Fax: [facility] [place], [place] [address] Phone: [phone] Fax: [phone]     Social Determinants of Health (SDOH) Interventions    Readmission Risk Interventions No flowsheet data found.

## 2019-02-03 NOTE — Consult Note (Signed)
Pulmonary Medicine          Date: 02/03/2019,   MRN# [id number] Tina Hayes 05/23/1945     AdmissionWeight: 136 kg                 CurrentWeight: (!) 138.5 kg      CHIEF COMPLAINT:   Acute exacerbation of COPD   SUBJECTIVE    Patient is clear to auscultation bilaterally , she is breathing better. No need for O2, may d/c home with prednisone taper and zithromax for 3 days. Follow up appt has been made in appx 10 days  PAST MEDICAL HISTORY   Past Medical History:  Diagnosis Date  . (HFpEF) heart failure with preserved ejection fraction ([place])    a. 05/2016 Echo: EF 60-65%, mild to mod LVH, Gr1 DD, mild MR, mildly dil LA, mod TR, mildly to mod increased PASP.  [name] Acute diastolic heart failure ([place]) 01/27/2017  . Anxiety   . Chest pain 06/16/2016  . CHF (congestive heart failure) ([place])   . Chronic back pain   . Chronic diastolic congestive heart failure ([place]) 02/13/2017  . COPD (chronic obstructive pulmonary disease) ([place])   . Coronary artery disease    a. s/p remote PCI x 5;  b. 2006 s/p CABG x 3 ([place], [place]); b. 05/2016 MV: attenuation corrected images w/o ischemia or wma-->Med rx.  . Coronary artery disease of native artery of native heart with stable angina pectoris ([place]) 06/17/2016  . Depression   . Diabetes mellitus without complication ([place])   . Essential hypertension 06/30/2016  . Heart attack ([place])    Total of 3 per pt.  . Hypertension   . Hypertensive urgency 06/03/2015  . Seizure ([place])   . Seizures ([place])      SURGICAL HISTORY   Past Surgical History:  Procedure Laterality Date  . ABDOMINAL HYSTERECTOMY    . ANKLE SURGERY Right   . CATARACT EXTRACTION W/ INTRAOCULAR LENS  IMPLANT, BILATERAL    . COLONOSCOPY WITH PROPOFOL N/A 05/04/2017   Procedure: COLONOSCOPY WITH PROPOFOL;  Surgeon: [name], MD;  Location: [facility] ENDOSCOPY;  Service: Endoscopy;  Laterality: N/A;  . CORONARY ANGIOPLASTY    . CORONARY ARTERY  BYPASS GRAFT     TRIPLE BYPASS  . ESOPHAGOGASTRODUODENOSCOPY (EGD) WITH PROPOFOL N/A 05/04/2017   Procedure: ESOPHAGOGASTRODUODENOSCOPY (EGD) WITH PROPOFOL;  Surgeon: [name], MD;  Location: [facility] ENDOSCOPY;  Service: Endoscopy;  Laterality: N/A;  . EXCISION MASS LOWER EXTREMETIES Right 03/02/2018   Procedure: EXCISION SOFT TISSUE MASS FROM MEDIAL ASPECT OF RIGHT ANKLE;  Surgeon: [name], MD;  Location: [facility] ORS;  Service: Orthopedics;  Laterality: Right;  . FRACTURE SURGERY    . KNEE SURGERY Right   . MOUTH SURGERY Left 07/14/2017     FAMILY HISTORY   Family History  Problem Relation Age of Onset  . Diabetes Other   . Hypertension Other   . Diabetes Mother   . Heart failure Mother   . Heart disease Mother   . Heart attack Mother   . Stroke Mother   . Depression Mother   . Hypertension Mother   . Cancer Sister        brain  . Hypertension Sister   . Diabetes Brother   . Hypertension Brother   . Heart failure Sister   . Heart attack Sister   . SIDS Sister      SOCIAL HISTORY   Social History  Tobacco Use  . Smoking status: Former Smoker    Packs/day: 0.25    Years: 20.00    Pack years: 5.00    Types: Cigarettes    Quit date: 07/01/2006    Years since quitting: 12.6  . Smokeless tobacco: Never Used  Substance Use Topics  . Alcohol use: No    Alcohol/week: 0.0 standard drinks  . Drug use: No     MEDICATIONS    Home Medication:    Current Medication:  Current Facility-Administered Medications:  .  acetaminophen (TYLENOL) tablet 650 mg, 650 mg, Oral, Q6H PRN **OR** acetaminophen (TYLENOL) suppository 650 mg, 650 mg, Rectal, Q6H PRN, Lance Coon, MD .  atorvastatin (LIPITOR) tablet 40 mg, 40 mg, Oral, Corwin Levins, MD, 40 mg at 02/02/19 2059 .  azithromycin (ZITHROMAX) tablet 500 mg, 500 mg, Oral, Daily, Lance Coon, MD, 500 mg at 02/02/19 2059 .  carvedilol (COREG) tablet 3.125 mg, 3.125 mg, Oral, BID WC, Lance Coon, MD, 3.125 mg  at 02/03/19 0804 .  clopidogrel (PLAVIX) tablet 75 mg, 75 mg, Oral, Daily, Lance Coon, MD, 75 mg at 02/03/19 0803 .  enoxaparin (LOVENOX) injection 40 mg, 40 mg, Subcutaneous, BID, Lance Coon, MD, 40 mg at 02/03/19 0537 .  famotidine (PEPCID) tablet 20 mg, 20 mg, Oral, QHS, Lance Coon, MD, 20 mg at 02/02/19 2059 .  insulin aspart (novoLOG) injection 0-15 Units, 0-15 Units, Subcutaneous, TID WC, Lance Coon, MD, 15 Units at 02/03/19 1157 .  insulin aspart (novoLOG) injection 0-5 Units, 0-5 Units, Subcutaneous, Corwin Levins, MD, 5 Units at 02/02/19 2057 .  insulin glargine (LANTUS) injection 25 Units, 25 Units, Subcutaneous, BID, Vaughan Basta, MD, 25 Units at 02/03/19 0934 .  ipratropium-albuterol (DUONEB) 0.5-2.5 (3) MG/3ML nebulizer solution 3-6 mL, 3-6 mL, Inhalation, QID PRN, Lance Coon, MD, 3 mL at 02/02/19 1815 .  isosorbide mononitrate (IMDUR) 24 hr tablet 30 mg, 30 mg, Oral, BID, Lance Coon, MD, 30 mg at 02/03/19 0803 .  levETIRAcetam (KEPPRA) tablet 750 mg, 750 mg, Oral, BID, Lance Coon, MD, 750 mg at 02/03/19 0806 .  losartan (COZAAR) tablet 50 mg, 50 mg, Oral, Daily, Vaughan Basta, MD, 50 mg at 02/03/19 0804 .  methylPREDNISolone sodium succinate (SOLU-MEDROL) 125 mg/2 mL injection 60 mg, 60 mg, Intravenous, Q12H, Vaughan Basta, MD, 60 mg at 02/03/19 0804 .  mometasone-formoterol (DULERA) 200-5 MCG/ACT inhaler 2 puff, 2 puff, Inhalation, BID, Lance Coon, MD, 2 puff at 02/03/19 0805 .  montelukast (SINGULAIR) tablet 10 mg, 10 mg, Oral, Daily, Lance Coon, MD, 10 mg at 02/03/19 0804 .  ondansetron (ZOFRAN) tablet 4 mg, 4 mg, Oral, Q6H PRN **OR** ondansetron (ZOFRAN) injection 4 mg, 4 mg, Intravenous, Q6H PRN, Lance Coon, MD .  oxyCODONE (Oxy IR/ROXICODONE) immediate release tablet 5 mg, 5 mg, Oral, Q4H PRN, Lance Coon, MD, 5 mg at 02/02/19 2353 .  sertraline (ZOLOFT) tablet 50 mg, 50 mg, Oral, Daily, Lance Coon, MD, 50 mg at  02/03/19 0804 .  tiotropium (SPIRIVA) inhalation capsule ([facility] use ONLY) 18 mcg, 18 mcg, Inhalation, Daily, Lance Coon, MD, 18 mcg at 02/03/19 0806 .  traZODone (DESYREL) tablet 50 mg, 50 mg, Oral, QHS, Lance Coon, MD, 50 mg at 02/02/19 2058    ALLERGIES   Aspirin     REVIEW OF SYSTEMS    Review of Systems:  Gen:  Denies  fever, sweats, chills weigh loss  HEENT: Denies blurred vision, double vision, ear pain, eye pain, hearing loss, nose bleeds, sore throat Cardiac:  No dizziness, chest pain or heaviness, chest tightness,edema Resp:   Denies cough or sputum porduction, shortness of breath,wheezing, hemoptysis,  Gi: Denies swallowing difficulty, stomach pain, nausea or vomiting, diarrhea, constipation, bowel incontinence Gu:  Denies bladder incontinence, burning urine Ext:   Denies Joint pain, stiffness or swelling Skin: Denies  skin rash, easy bruising or bleeding or hives Endoc:  Denies polyuria, polydipsia , polyphagia or weight change Psych:   Denies depression, insomnia or hallucinations   Other:  All other systems negative   VS: BP (!) 152/74 (BP Location: Left Arm)   Pulse (!) 55   Temp 99.5 F (37.5 C) (Oral)   Resp 19   Ht 5\' 11"  (1.803 m)   Wt (!) 138.5 kg   SpO2 100%   BMI 42.59 kg/m      PHYSICAL EXAM    GENERAL:NAD, no fevers, chills, no weakness no fatigue HEAD: Normocephalic, atraumatic.  EYES: Pupils equal, round, reactive to light. Extraocular muscles intact. No scleral icterus.  MOUTH: Moist mucosal membrane. Dentition intact. No abscess noted.  EAR, NOSE, THROAT: Clear without exudates. No external lesions.  NECK: Supple. No thyromegaly. No nodules. No JVD.  PULMONARY: Mild rhonchorous breath sounds bilaterally. CARDIOVASCULAR: S1 and S2. Regular rate and rhythm. No murmurs, rubs, or gallops. No edema. Pedal pulses 2+ bilaterally.  GASTROINTESTINAL: Soft, nontender, nondistended. No masses. Positive bowel sounds. No hepatosplenomegaly.   MUSCULOSKELETAL: No swelling, clubbing, or edema. Range of motion full in all extremities.  NEUROLOGIC: Cranial nerves II through XII are intact. No gross focal neurological deficits. Sensation intact. Reflexes intact.  SKIN: No ulceration, lesions, rashes, or cyanosis. Skin warm and dry. Turgor intact.  PSYCHIATRIC: Mood, affect within normal limits. The patient is awake, alert and oriented x 3. Insight, judgment intact.       IMAGING    Dg Chest Portable 1 View  Result Date: 02/01/2019 CLINICAL DATA:  Shortness of breath EXAM: PORTABLE CHEST 1 VIEW COMPARISON:  01/17/2019, 03/16/2017 FINDINGS: Post sternotomy changes. Cardiomegaly. Diffuse interstitial opacity appears chronic. No consolidation or effusion. No pneumothorax. Moderate to large hiatal hernia IMPRESSION: No active disease. Diffuse chronic appearing interstitial opacity. Hiatal hernia Electronically Signed   By: Donavan Foil M.D.   On: 02/01/2019 21:13   Dg Chest Portable 1 View  Result Date: 01/17/2019 CLINICAL DATA:  Shortness of breath EXAM: PORTABLE CHEST 1 VIEW COMPARISON:  May 25, 2018 FINDINGS: There is mild scarring in the left base. Lungs elsewhere are clear. Heart is borderline enlarged with pulmonary vascularity normal. No adenopathy. There is a focal hiatal hernia. No bone lesions. IMPRESSION: Scarring left base. No edema or consolidation. Borderline cardiac enlargement. Focal hiatal hernia. Status post coronary artery bypass grafting. Electronically Signed   By: Lowella Grip III M.D.   On: 01/17/2019 18:03     SPIROMETRY: FVC was 2.11 liters, 92% of predicted/Post 2.33, 101%, 10% Change FEV1 was 1.36, 74% of predicted/Post 1.72, 94%, 26% Change FEV1 ratio was 65/Post 74 FEF 25-75% liters per second was 28% of predicted/Post 54%, 96% Change SMALL VOLUME NEBULIZER given 2.5 mg Albuterol for Post Spirometry   LUNG VOLUMES: TLC was 83% of predicted RV was 97% of predicted  DIFFUSION CAPACITY: DLCO was  43% of predicted DLCO/VA was 93% of predicted  FLOW VOLUME LOOP: Scooping of expiratory limb suggestive of obstructive physiology.    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 Anselm Jungling for allowing me to participate in the care of this patient.   Patient/Family are satisfied with care plan and all questions have been answered.  This document was prepared using Dragon voice recognition software and may include unintentional dictation errors.     Ottie Glazier, M.D.  Division of [facility]

## 2019-02-03 NOTE — Discharge Summary (Signed)
[facility] NAME: Tina Hayes    MR#:  [id number]  DATE OF BIRTH:  [birth year]  DATE OF ADMISSION:  02/01/2019 ADMITTING PHYSICIAN: Lance Coon, MD  DATE OF DISCHARGE: 02/03/2019   PRIMARY CARE PHYSICIAN: Soles, Howell Rucks, MD    ADMISSION DIAGNOSIS:  Breathing Difficulties  DISCHARGE DIAGNOSIS:  Principal Problem:   COPD with acute exacerbation ([place]) Active Problems:   Coronary artery disease of native artery of native heart with stable angina pectoris (HCC)   Essential hypertension   Diabetes mellitus (HCC)   Chronic diastolic congestive heart failure ([place])   SECONDARY DIAGNOSIS:   Past Medical History:  Diagnosis Date  . (HFpEF) heart failure with preserved ejection fraction ([place])    a. 05/2016 Echo: EF 60-65%, mild to mod LVH, Gr1 DD, mild MR, mildly dil LA, mod TR, mildly to mod increased PASP.  [name] Acute diastolic heart failure ([place]) 01/27/2017  . Anxiety   . Chest pain 06/16/2016  . CHF (congestive heart failure) ([place])   . Chronic back pain   . Chronic diastolic congestive heart failure ([place]) 02/13/2017  . COPD (chronic obstructive pulmonary disease) ([place])   . Coronary artery disease    a. s/p remote PCI x 5;  b. 2006 s/p CABG x 3 ([place]); b. 05/2016 MV: attenuation corrected images w/o ischemia or wma-->Med rx.  . Coronary artery disease of native artery of native heart with stable angina pectoris ([place]) 06/17/2016  . Depression   . Diabetes mellitus without complication ([place])   . Essential hypertension 06/30/2016  . Heart attack ([place])    Total of 3 per pt.  . Hypertension   . Hypertensive urgency 06/03/2015  . Seizure ([place])   . Seizures [facility])     HOSPITAL COURSE:   *COPD with acute exacerbation (HCC) -IV Solu-Medrol, azithromycin, nebulizers, PRN antitussive, continue home meds Called pulmonary consult and he suggested to add azithromycin and Dulera. Advised patient  to follow-up with pulmonologist in clinic.  *Coronary artery disease of native artery of native heart with stable angina pectoris ([place]) -continue home meds *Essential hypertension -home dose antihypertensives *Diabetes mellitus ([place]) -sliding scale insulin, Added lantus.  *Chronic diastolic congestive heart failure ([place]) -continue home medications    DISCHARGE CONDITIONS:   Stable  CONSULTS OBTAINED:  Treatment Team:  Ottie Glazier, MD  DRUG ALLERGIES:   Allergies  Allergen Reactions  . Aspirin Anaphylaxis    DISCHARGE MEDICATIONS:   Allergies as of 02/03/2019      Reactions   Aspirin Anaphylaxis      Medication List    STOP taking these medications   amLODipine 10 MG tablet Commonly known as: NORVASC     TAKE these medications   albuterol (2.5 MG/3ML) 0.083% nebulizer solution Commonly known as: PROVENTIL Take 2.5 mg every 4 (four) hours as needed by nebulization for wheezing or shortness of breath.   albuterol 108 (90 Base) MCG/ACT inhaler Commonly known as: VENTOLIN HFA Inhale 2 puffs into the lungs every 6 (six) hours as needed for wheezing or shortness of breath.   atorvastatin 40 MG tablet Commonly known as: LIPITOR Take 1 tablet (40 mg total) by mouth at bedtime.   azithromycin 500 MG tablet Commonly known as: ZITHROMAX Take 1 tablet (500 mg total) by mouth daily for 3 days.   B-D UF III MINI PEN NEEDLES 31G X 5 MM Misc Generic drug: Insulin Pen Needle USE WITH INSULIN PEN INJECTIONS TWICE  DAILY   Calcium 600-D 600-400 MG-UNIT Tabs Generic drug: Calcium Carbonate-Vitamin D3 Take 1 tablet by mouth daily.   carvedilol 3.125 MG tablet Commonly known as: COREG Take 1 tablet (3.125 mg total) by mouth 2 (two) times daily with a meal.   cetirizine 10 MG tablet Commonly known as: ZYRTEC Take 10 mg by mouth daily.   clopidogrel 75 MG tablet Commonly known as: PLAVIX Take 1 tablet (75 mg total) by mouth daily.   Combivent Respimat  20-100 MCG/ACT Aers respimat Generic drug: Ipratropium-Albuterol Inhale 1 puff into the lungs 4 (four) times daily as needed for wheezing.   famotidine 20 MG tablet Commonly known as: PEPCID Take 20 mg by mouth at bedtime.   HYDROcodone-acetaminophen 7.5-325 MG tablet Commonly known as: NORCO Take 1 tablet by mouth 3 (three) times daily. Start taking on: March 07, 2019 What changed: Another medication with the same name was removed. Continue taking this medication, and follow the directions you see here.   insulin aspart 100 UNIT/ML injection Commonly known as: novoLOG Inject 10 Units into the skin 3 (three) times daily.   insulin glargine 100 UNIT/ML injection Commonly known as: LANTUS Inject 30-40 Units into the skin See admin instructions. Inject 30 units in the morning at 40 units at bedtime.   isosorbide mononitrate 30 MG 24 hr tablet Commonly known as: IMDUR Take 1 tablet (30 mg total) by mouth 2 (two) times daily.   levETIRAcetam 750 MG tablet Commonly known as: KEPPRA Take 750 mg by mouth 2 (two) times daily.   losartan 100 MG tablet Commonly known as: COZAAR Take 1 tablet (100 mg total) by mouth daily.   meloxicam 7.5 MG tablet Commonly known as: MOBIC Take 7.5 mg by mouth daily as needed for pain.   mometasone-formoterol 200-5 MCG/ACT Aero Commonly known as: DULERA Inhale 2 puffs into the lungs 2 (two) times daily.   montelukast 10 MG tablet Commonly known as: SINGULAIR Take 10 mg by mouth daily.   nitroGLYCERIN 0.4 MG SL tablet Commonly known as: NITROSTAT DISSOLVE ONE TABLET UNDER THE TONGUE EVERY 5 MINUTES AS NEEDED FOR CHEST PAIN.  DO NOT EXCEED A TOTAL OF 3 DOSES IN 15 MINUTES What changed: See the new instructions.   potassium chloride SA 20 MEQ tablet Commonly known as: KLOR-CON Take 2 tablets (40 mEq total) by mouth 2 (two) times daily. Take extra 2 tablets (40 meq) if you take metolazone What changed: additional instructions   predniSONE 10  MG (21) Tbpk tablet Commonly known as: STERAPRED UNI-PAK 21 TAB Take 6 tabs first day, 5 tab on day 2, then 4 on day 3rd, 3 tabs on day 4th , 2 tab on day 5th, and 1 tab on 6th day.   sertraline 50 MG tablet Commonly known as: ZOLOFT Take 50 mg by mouth daily.   tiotropium 18 MCG inhalation capsule Commonly known as: SPIRIVA Place 18 mcg into inhaler and inhale daily.   torsemide 20 MG tablet Commonly known as: DEMADEX Take 60 mg by mouth 2 (two) times daily.   traZODone 50 MG tablet Commonly known as: DESYREL Take 50 mg by mouth at bedtime.        DISCHARGE INSTRUCTIONS:   Follow with primary care physician and pulmonologist in 1 to 2 weeks.  If you experience worsening of your admission symptoms, develop shortness of breath, life threatening emergency, suicidal or homicidal thoughts you must seek medical attention immediately by calling 911 or calling your MD immediately  if symptoms less  severe.  You Must read complete instructions/literature along with all the possible adverse reactions/side effects for all the Medicines you take and that have been prescribed to you. Take any new Medicines after you have completely understood and accept all the possible adverse reactions/side effects.   Please note  You were cared for by a hospitalist during your hospital stay. If you have any questions about your discharge medications or the care you received while you were in the hospital after you are discharged, you can call the unit and asked to speak with the hospitalist on call if the hospitalist that took care of you is not available. Once you are discharged, your primary care physician will handle any further medical issues. Please note that NO REFILLS for any discharge medications will be authorized once you are discharged, as it is imperative that you return to your primary care physician (or establish a relationship with a primary care physician if you do not have one) for your  aftercare needs so that they can reassess your need for medications and monitor your lab values.    Today   CHIEF COMPLAINT:   Chief Complaint  Patient presents with  . Shortness of Breath    HISTORY OF PRESENT ILLNESS:  Tina Hayes  is a 73 y.o. female with a known history of shortness of breath and wheezing.  She states that she was recently hospitalized here for several days for COPD exacerbation.  She went home for about 4 - 5 days.  She states that while she was finishing her steroid taper she felt good, but the day after she finished taking it she developed shortness of breath again.  She came in today because this was getting worse.  Work-up here is consistent with COPD exacerbation still.  Hospitalist called for admission.  VITAL SIGNS:  Blood pressure (!) 177/75, pulse 61, temperature 97.8 F (36.6 C), temperature source Oral, resp. rate 20, height 5\' 11"  (1.803 m), weight (!) 138.5 kg, SpO2 100 %.  I/O:    Intake/Output Summary (Last 24 hours) at 02/03/2019 1306 Last data filed at 02/03/2019 1017 Gross per 24 hour  Intake 360 ml  Output 500 ml  Net -140 ml    PHYSICAL EXAMINATION:   GENERAL:  73 y.o.-year-old patient lying in the bed with no acute distress.  EYES: Pupils equal, round, reactive to light and accommodation. No scleral icterus. Extraocular muscles intact.  HEENT: Head atraumatic, normocephalic. Oropharynx and nasopharynx clear.  NECK:  Supple, no jugular venous distention. No thyroid enlargement, no tenderness.  LUNGS: Normal breath sounds bilaterally, some wheezing, no crepitation. No use of accessory muscles of respiration.  CARDIOVASCULAR: S1, S2 normal. No murmurs, rubs, or gallops.  ABDOMEN: Soft, nontender, nondistended. Bowel sounds present. No organomegaly or mass.  EXTREMITIES: No pedal edema, cyanosis, or clubbing.  NEUROLOGIC: Cranial nerves II through XII are intact. Muscle strength 5/5 in all extremities. Sensation intact. Gait not checked.   PSYCHIATRIC: The patient is alert and oriented x 3.  SKIN: No obvious rash, lesion, or ulcer.   DATA REVIEW:   CBC Recent Labs  Lab 02/02/19 0334  WBC 7.9  HGB 10.3*  HCT 33.0*  PLT 237    Chemistries  Recent Labs  Lab 02/01/19 2055 02/02/19 0334  NA 141 139  K 3.9 3.3*  CL 104 104  CO2 24 23  GLUCOSE 101* 332*  BUN 23 24*  CREATININE 1.24* 1.12*  CALCIUM 9.1 9.1  AST 25  --   ALT  12  --   ALKPHOS 163*  --   BILITOT 0.8  --     Cardiac Enzymes No results for input(s): TROPONINI in the last 168 hours.  Microbiology Results  Results for orders placed or performed during the hospital encounter of 02/01/19  SARS Coronavirus 2 [facility] order, Performed in [facility] hospital lab) Nasopharyngeal Nasopharyngeal Swab     Status: None   Collection Time: 02/01/19  8:55 PM   Specimen: Nasopharyngeal Swab  Result Value Ref Range Status   SARS Coronavirus 2 NEGATIVE NEGATIVE Final    Comment: (NOTE) If result is NEGATIVE SARS-CoV-2 target nucleic acids are NOT DETECTED. The SARS-CoV-2 RNA is generally detectable in upper and lower  respiratory specimens during the acute phase of infection. The lowest  concentration of SARS-CoV-2 viral copies this assay can detect is 250  copies / mL. A negative result does not preclude SARS-CoV-2 infection  and should not be used as the sole basis for treatment or other  patient management decisions.  A negative result may occur with  improper specimen collection / handling, submission of specimen other  than nasopharyngeal swab, presence of viral mutation(s) within the  areas targeted by this assay, and inadequate number of viral copies  (<250 copies / mL). A negative result must be combined with clinical  observations, patient history, and epidemiological information. If result is POSITIVE SARS-CoV-2 target nucleic acids are DETECTED. The SARS-CoV-2 RNA is generally detectable in upper and lower  respiratory specimens dur ing  the acute phase of infection.  Positive  results are indicative of active infection with SARS-CoV-2.  Clinical  correlation with patient history and other diagnostic information is  necessary to determine patient infection status.  Positive results do  not rule out bacterial infection or co-infection with other viruses. If result is PRESUMPTIVE POSTIVE SARS-CoV-2 nucleic acids MAY BE PRESENT.   A presumptive positive result was obtained on the submitted specimen  and confirmed on repeat testing.  While 2019 novel coronavirus  (SARS-CoV-2) nucleic acids may be present in the submitted sample  additional confirmatory testing may be necessary for epidemiological  and / or clinical management purposes  to differentiate between  SARS-CoV-2 and other Sarbecovirus currently known to infect humans.  If clinically indicated additional testing with an alternate test  methodology [phone]) is advised. The SARS-CoV-2 RNA is generally  detectable in upper and lower respiratory sp ecimens during the acute  phase of infection. The expected result is Negative. Fact Sheet for Patients:  StrictlyIdeas.no Fact Sheet for Healthcare Providers: BankingDealers.co.za This test is not yet approved or cleared by the [place] FDA and has been authorized for detection and/or diagnosis of SARS-CoV-2 by FDA under an Emergency Use Authorization (EUA).  This EUA will remain in effect (meaning this test can be used) for the duration of the COVID-19 declaration under Section 564(b)(1) of the Act, 21 U.S.C. section 360bbb-3(b)(1), unless the authorization is terminated or revoked sooner. Performed at [facility], [address] [place], [place] [address]     RADIOLOGY:  Dg Chest Portable 1 View  Result Date: 02/01/2019 CLINICAL DATA:  Shortness of breath EXAM: PORTABLE CHEST 1 VIEW COMPARISON:  01/17/2019, 03/16/2017 FINDINGS: Post sternotomy changes.  Cardiomegaly. Diffuse interstitial opacity appears chronic. No consolidation or effusion. No pneumothorax. Moderate to large hiatal hernia IMPRESSION: No active disease. Diffuse chronic appearing interstitial opacity. Hiatal hernia Electronically Signed   By: Donavan Foil M.D.   On: 02/01/2019 21:13    EKG:  Orders placed or performed during the hospital encounter of 02/01/19  . ED EKG  . ED EKG  . EKG 12-Lead  . EKG 12-Lead      Management plans discussed with the patient, family and they are in agreement.  CODE STATUS: DNR     Code Status Orders  (From admission, onward)         Start     Ordered   02/02/19 1427  Do not attempt resuscitation (DNR)  Continuous    Question Answer Comment  In the event of cardiac or respiratory ARREST Do not call a "code blue"   In the event of cardiac or respiratory ARREST Do not perform Intubation, CPR, defibrillation or ACLS   In the event of cardiac or respiratory ARREST Use medication by any route, position, wound care, and other measures to relive pain and suffering. May use oxygen, suction and manual treatment of airway obstruction as needed for comfort.      02/02/19 1427        Code Status History    Date Active Date Inactive Code Status Order ID Comments User Context   02/02/2019 0211 02/02/2019 1427 Full Code [id number]  Lance Coon, MD Inpatient   01/18/2019 0029 01/19/2019 1838 Full Code [id number]  Lance Coon, MD Inpatient   05/25/2018 2009 05/28/2018 2041 Full Code [id number]  Nicholes Mango, MD Inpatient   03/02/2018 1555 03/02/2018 2004 Full Code [id number]  [name], MD Inpatient   11/10/2017 2012 11/12/2017 1551 Full Code [id number]  Hillary Bow, MD ED   08/16/2017 1230 08/17/2017 1631 DNR [id number]  Hillary Bow, MD Inpatient   08/16/2017 0253 08/16/2017 1230 Full Code [id number]  Lance Coon, MD Inpatient   01/29/2017 1749 01/30/2017 2040 Full Code [id number]  Loletha Grayer, MD ED   06/16/2016 2036 06/18/2016 1749 Full  Code [id number]  Epifanio Lesches, MD ED   08/28/2015 1034 08/30/2015 2023 Full Code [id number]  Dustin Flock, MD ED   06/03/2015 2238 06/05/2015 2044 Full Code [id number]  Brand Males, MD Inpatient   04/14/2015 1724 04/15/2015 2006 Full Code [id number]  Dustin Flock, MD Inpatient   Advance Care Planning Activity      TOTAL TIME TAKING CARE OF THIS PATIENT: 35 minutes.    Vaughan Basta M.D on 02/03/2019 at 1:06 PM  Between 7am to 6pm - Pager - [phone]  After 6pm go to [email or web address] - password [email or web address] [facility]  Office  [phone]  CC: Primary care physician; Herminio Commons, MD   Note: This dictation was prepared with Dragon dictation along with smaller phrase technology. Any transcriptional errors that result from this process are unintentional.

## 2019-02-03 NOTE — TOC Transition Note (Signed)
Transition of Care [facility]) - CM/SW Discharge Note   Patient Details  Name: Tina Hayes MRN: [id number] Date of Birth: 1946-01-09  [facility] CM/SW Contact:  Candie Chroman, LCSW Phone Number: 02/03/2019, 2:19 PM   Clinical Narrative:  Patient is not currently active with [facility] but they can accept the referral. No further concerns. CSW signing off.   Final next level of care: Home w Home Health Services Barriers to Discharge: Barriers Resolved   Patient Goals and CMS Choice   CMS Medicare.gov Compare Post Acute Care list provided to:: Patient Choice offered to / list presented to : Patient  Discharge Placement                Patient to be transferred to facility by: Son will pick her up.   Patient and family notified of of transfer: 02/03/19  Discharge Plan and Services     Post Acute Care Choice: Home Health                    HH Arranged: RN, PT, Nurse's Aide [facility] Agency: [place] Date [facility] Agency Contacted: 02/03/19   Representative spoke with at [place]: Adela Lank  Social Determinants of Health (SDOH) Interventions     Readmission Risk Interventions No flowsheet data found.

## 2019-02-06 ENCOUNTER — Other Ambulatory Visit: Payer: Self-pay | Admitting: Family

## 2019-02-06 MED ORDER — POTASSIUM CHLORIDE CRYS ER 20 MEQ PO TBCR: 40.0000 meq | EXTENDED_RELEASE_TABLET | Freq: Two times a day (BID) | ORAL | 5 refills | Status: DC

## 2019-02-14 ENCOUNTER — Other Ambulatory Visit: Payer: Self-pay | Admitting: Anesthesiology

## 2019-02-14 ENCOUNTER — Ambulatory Visit (HOSPITAL_BASED_OUTPATIENT_CLINIC_OR_DEPARTMENT_OTHER): Payer: Medicare HMO | Admitting: Anesthesiology

## 2019-02-14 ENCOUNTER — Other Ambulatory Visit: Payer: Self-pay

## 2019-02-14 ENCOUNTER — Ambulatory Visit
Admission: RE | Admit: 2019-02-14 | Discharge: 2019-02-14 | Disposition: A | Discharge status: Home / Self Care | Payer: Medicare HMO | Source: Ambulatory Visit | Attending: Anesthesiology | Admitting: Anesthesiology

## 2019-02-14 ENCOUNTER — Encounter: Payer: Self-pay | Admitting: Anesthesiology

## 2019-02-14 VITALS — BP 141/101 | HR 61 | Temp 97.2°F | Resp 16 | Ht 71.0 in | Wt 294.0 lb

## 2019-02-14 DIAGNOSIS — M5431 Sciatica, right side: Secondary | ICD-10-CM | POA: Diagnosis not present

## 2019-02-14 DIAGNOSIS — F1190 Opioid use, unspecified, uncomplicated: Secondary | ICD-10-CM | POA: Insufficient documentation

## 2019-02-14 DIAGNOSIS — G894 Chronic pain syndrome: Secondary | ICD-10-CM | POA: Diagnosis present

## 2019-02-14 DIAGNOSIS — M48062 Spinal stenosis, lumbar region with neurogenic claudication: Secondary | ICD-10-CM | POA: Diagnosis not present

## 2019-02-14 DIAGNOSIS — M5136 Other intervertebral disc degeneration, lumbar region: Secondary | ICD-10-CM | POA: Insufficient documentation

## 2019-02-14 DIAGNOSIS — M5432 Sciatica, left side: Secondary | ICD-10-CM | POA: Diagnosis present

## 2019-02-14 DIAGNOSIS — R52 Pain, unspecified: Secondary | ICD-10-CM

## 2019-02-14 DIAGNOSIS — M47817 Spondylosis without myelopathy or radiculopathy, lumbosacral region: Secondary | ICD-10-CM | POA: Diagnosis present

## 2019-02-14 DIAGNOSIS — M4716 Other spondylosis with myelopathy, lumbar region: Secondary | ICD-10-CM | POA: Insufficient documentation

## 2019-02-14 MED ORDER — HYDROCODONE-ACETAMINOPHEN 7.5-325 MG PO TABS: 1.0000 | ORAL_TABLET | Freq: Three times a day (TID) | ORAL | 0 refills | Status: DC

## 2019-02-14 MED ORDER — ROPIVACAINE HCL 2 MG/ML IJ SOLN
10.0000 mL | Freq: Once | INTRAMUSCULAR | Status: AC
  Administered 2019-02-14: 10 mL via EPIDURAL

## 2019-02-14 MED ORDER — IOHEXOL 180 MG/ML  SOLN
INTRAMUSCULAR | Status: AC
  Filled 2019-02-14: qty 20

## 2019-02-14 MED ORDER — LACTATED RINGERS IV SOLN: 1000.0000 mL | INTRAVENOUS | Status: DC

## 2019-02-14 MED ORDER — SODIUM CHLORIDE 0.9% FLUSH
10.0000 mL | Freq: Once | INTRAVENOUS | Status: AC
  Administered 2019-02-14: 14:00:00 10 mL

## 2019-02-14 MED ORDER — TRIAMCINOLONE ACETONIDE 40 MG/ML IJ SUSP
40.0000 mg | Freq: Once | INTRAMUSCULAR | Status: AC
  Administered 2019-02-14: 40 mg

## 2019-02-14 MED ORDER — SODIUM CHLORIDE (PF) 0.9 % IJ SOLN
INTRAMUSCULAR | Status: AC
  Filled 2019-02-14: qty 10

## 2019-02-14 MED ORDER — IOPAMIDOL (ISOVUE-M 200) INJECTION 41%
20.0000 mL | Freq: Once | INTRAMUSCULAR | Status: DC | PRN
  Administered 2019-02-14: 14:00:00 20 mL
  Filled 2019-02-14: qty 20

## 2019-02-14 MED ORDER — TRIAMCINOLONE ACETONIDE 40 MG/ML IJ SUSP
INTRAMUSCULAR | Status: AC
  Filled 2019-02-14: qty 1

## 2019-02-14 MED ORDER — LIDOCAINE HCL (PF) 1 % IJ SOLN
5.0000 mL | Freq: Once | INTRAMUSCULAR | Status: AC
  Administered 2019-02-14: 5 mL via SUBCUTANEOUS

## 2019-02-14 MED ORDER — LIDOCAINE HCL (PF) 1 % IJ SOLN
INTRAMUSCULAR | Status: AC
  Filled 2019-02-14: qty 5

## 2019-02-14 MED ORDER — ROPIVACAINE HCL 2 MG/ML IJ SOLN
INTRAMUSCULAR | Status: AC
  Filled 2019-02-14: qty 10

## 2019-02-14 NOTE — Progress Notes (Signed)
Subjective:  Patient ID: Tina Hayes, female    DOB: [birth year]  Age: 73 y.o. MRN: [id number]  CC: Back Pain (low)   Procedure: L5-S1 epidural steroid under fluoroscopic guidance without sedation  HPI [name] presents for reevaluation.  She is starting to have more low back pain with left posterior lateral leg pain consistent with what she has experienced in the past.  Previously she has had epidural steroid injections giving her good relief rated at approximately 75% improvement generally lasting 2 months at a time.  She then gets gradual recurrence of that same type pain over time.  Unfortunately she has failed conservative therapy and still requires opioid management which works well.  She denies any side effects with the medication she is taking.  Stretching strengthening exercises and physical therapy have failed to give her any significant provement other than some mild improvement in the muscular pain.  She is a nonsurgical candidate.  She was recently released from the hospital secondary to a COPD exacerbation.  She did get a flu shot 12 days ago.  She has come off of her Plavix and been off of this for 10 days as well.  Otherwise she is in her usual state of health with no new changes in bowel or bladder function  Outpatient Medications Prior to Visit  Medication Sig Dispense Refill  . albuterol (PROVENTIL HFA;VENTOLIN HFA) 108 (90 Base) MCG/ACT inhaler Inhale 2 puffs into the lungs every 6 (six) hours as needed for wheezing or shortness of breath. 1 Inhaler 2  . albuterol (PROVENTIL) (2.5 MG/3ML) 0.083% nebulizer solution Take 2.5 mg every 4 (four) hours as needed by nebulization for wheezing or shortness of breath.    [name] atorvastatin (LIPITOR) 40 MG tablet Take 1 tablet (40 mg total) by mouth at bedtime. 30 tablet 11  . B-D UF III MINI PEN NEEDLES 31G X 5 MM MISC USE WITH INSULIN PEN INJECTIONS TWICE DAILY    . Calcium Carbonate-Vitamin D3 (CALCIUM 600-D) 600-400 MG-UNIT TABS  Take 1 tablet by mouth daily.     . carvedilol (COREG) 3.125 MG tablet Take 1 tablet (3.125 mg total) by mouth 2 (two) times daily with a meal. 60 tablet 11  . cetirizine (ZYRTEC) 10 MG tablet Take 10 mg by mouth daily.    . clopidogrel (PLAVIX) 75 MG tablet Take 1 tablet (75 mg total) by mouth daily. 30 tablet 11  . COMBIVENT RESPIMAT 20-100 MCG/ACT AERS respimat Inhale 1 puff into the lungs 4 (four) times daily as needed for wheezing.     . famotidine (PEPCID) 20 MG tablet Take 20 mg by mouth at bedtime.    . insulin aspart (NOVOLOG) 100 UNIT/ML injection Inject 10 Units into the skin 3 (three) times daily.     . insulin glargine (LANTUS) 100 UNIT/ML injection Inject 30-40 Units into the skin See admin instructions. Inject 30 units in the morning at 40 units at bedtime.    . isosorbide mononitrate (IMDUR) 30 MG 24 hr tablet Take 1 tablet (30 mg total) by mouth 2 (two) times daily. 60 tablet 11  . levETIRAcetam (KEPPRA) 750 MG tablet Take 750 mg by mouth 2 (two) times daily.    [name] losartan (COZAAR) 100 MG tablet Take 1 tablet (100 mg total) by mouth daily. 30 tablet 11  . meloxicam (MOBIC) 7.5 MG tablet Take 7.5 mg by mouth daily as needed for pain.     . mometasone-formoterol (DULERA) 200-5 MCG/ACT AERO Inhale 2 puffs into  the lungs 2 (two) times daily. 13 g 0  . montelukast (SINGULAIR) 10 MG tablet Take 10 mg by mouth daily.     . nitroGLYCERIN (NITROSTAT) 0.4 MG SL tablet DISSOLVE ONE TABLET UNDER THE TONGUE EVERY 5 MINUTES AS NEEDED FOR CHEST PAIN.  DO NOT EXCEED A TOTAL OF 3 DOSES IN 15 MINUTES (Patient taking differently: Place 0.4 mg under the tongue every 5 (five) minutes as needed for chest pain. ) 25 tablet 0  . potassium chloride SA (KLOR-CON) 20 MEQ tablet Take 2 tablets (40 mEq total) by mouth 2 (two) times daily. Take extra 2 tablets (40 meq) if you take metolazone 140 tablet 5  . sertraline (ZOLOFT) 50 MG tablet Take 50 mg by mouth daily.    [name] tiotropium (SPIRIVA) 18 MCG inhalation  capsule Place 18 mcg into inhaler and inhale daily.    [name] torsemide (DEMADEX) 20 MG tablet Take 60 mg by mouth 2 (two) times daily.    . traZODone (DESYREL) 50 MG tablet Take 50 mg by mouth at bedtime.    Derrill Memo ON 03/07/2019] HYDROcodone-acetaminophen (NORCO) 7.5-325 MG tablet Take 1 tablet by mouth 3 (three) times daily. 90 tablet 0  . predniSONE (STERAPRED UNI-PAK 21 TAB) 10 MG (21) TBPK tablet Take 6 tabs first day, 5 tab on day 2, then 4 on day 3rd, 3 tabs on day 4th , 2 tab on day 5th, and 1 tab on 6th day. (Patient not taking: Reported on 02/14/2019) 21 tablet 0   No facility-administered medications prior to visit.     Review of Systems CNS: No confusion or sedation Cardiac: No angina or palpitations GI: No abdominal pain or constipation Constitutional: No nausea vomiting fevers or chills  Objective:  BP (!) 138/109   Pulse 60   Temp (!) 97.2 F (36.2 C)   Resp 16   Ht 5\' 11"  (1.803 m)   Wt 294 lb (133.4 kg)   SpO2 100%   BMI 41.00 kg/m    BP Readings from Last 3 Encounters:  02/14/19 (!) 138/109  02/03/19 (!) 152/74  01/20/19 114/66     Wt Readings from Last 3 Encounters:  02/14/19 294 lb (133.4 kg)  02/03/19 (!) 305 lb 5.4 oz (138.5 kg)  01/20/19 300 lb (136.1 kg)     Physical Exam Pt is alert and oriented PERRL EOMI HEART IS RRR no murmur or rub LCTA no wheezing or rales MUSCULOSKELETAL.  Reveals some paraspinous muscle tenderness but no overt trigger points.  She does have a positive straight leg raise on the left greater than right.  She does get pain in both legs.  Her muscle tone and bulk is good.  Labs  Lab Results  Component Value Date   HGBA1C 6.5 (H) 01/18/2019   HGBA1C 7.5 (H) 05/27/2018   HGBA1C 7.7 (H) 08/16/2017   Lab Results  Component Value Date   LDLCALC 58 01/30/2017   CREATININE 1.12 (H) 02/02/2019    -------------------------------------------------------------------------------------------------------------------- Lab  Results  Component Value Date   WBC 7.9 02/02/2019   HGB 10.3 (L) 02/02/2019   HCT 33.0 (L) 02/02/2019   PLT 237 02/02/2019   GLUCOSE 332 (H) 02/02/2019   CHOL 120 01/30/2017   TRIG 73 01/30/2017   HDL 47 01/30/2017   LDLCALC 58 01/30/2017   ALT 12 02/01/2019   AST 25 02/01/2019   NA 139 02/02/2019   K 3.3 (L) 02/02/2019   CL 104 02/02/2019   CREATININE 1.12 (H) 02/02/2019   BUN  24 (H) 02/02/2019   CO2 23 02/02/2019   INR 0.98 05/25/2018   HGBA1C 6.5 (H) 01/18/2019    --------------------------------------------------------------------------------------------------------------------- No results found.   Assessment & Plan:   Kadasha was seen today for back pain.  Diagnoses and all orders for this visit:  Bilateral sciatica  DDD (degenerative disc disease), lumbar  Spinal stenosis of lumbar region with neurogenic claudication  Lumbar spondylosis with myelopathy  Chronic, continuous use of opioids  Chronic pain syndrome  Facet arthritis of lumbosacral region  Other orders -     triamcinolone acetonide (KENALOG-40) injection 40 mg -     sodium chloride flush (NS) 0.9 % injection 10 mL -     ropivacaine (PF) 2 mg/mL (0.2%) (NAROPIN) injection 10 mL -     lidocaine (PF) (XYLOCAINE) 1 % injection 5 mL -     lactated ringers infusion 1,000 mL -     iopamidol (ISOVUE-M) 41 % intrathecal injection 20 mL -     HYDROcodone-acetaminophen (NORCO) 7.5-325 MG tablet; Take 1 tablet by mouth 3 (three) times daily.        ----------------------------------------------------------------------------------------------------------------------  Problem List Items Addressed This Visit    None    Visit Diagnoses    Bilateral sciatica    -  Primary   DDD (degenerative disc disease), lumbar       Relevant Medications   triamcinolone acetonide (KENALOG-40) injection 40 mg (Completed)   HYDROcodone-acetaminophen (NORCO) 7.5-325 MG tablet (Start on 04/06/2019)   Spinal stenosis  of lumbar region with neurogenic claudication       Relevant Medications   triamcinolone acetonide (KENALOG-40) injection 40 mg (Completed)   ropivacaine (PF) 2 mg/mL (0.2%) (NAROPIN) injection 10 mL (Completed)   lidocaine (PF) (XYLOCAINE) 1 % injection 5 mL (Completed)   HYDROcodone-acetaminophen (NORCO) 7.5-325 MG tablet (Start on 04/06/2019)   Lumbar spondylosis with myelopathy       Relevant Medications   triamcinolone acetonide (KENALOG-40) injection 40 mg (Completed)   HYDROcodone-acetaminophen (NORCO) 7.5-325 MG tablet (Start on 04/06/2019)   Chronic, continuous use of opioids       Chronic pain syndrome       Relevant Medications   triamcinolone acetonide (KENALOG-40) injection 40 mg (Completed)   ropivacaine (PF) 2 mg/mL (0.2%) (NAROPIN) injection 10 mL (Completed)   lidocaine (PF) (XYLOCAINE) 1 % injection 5 mL (Completed)   HYDROcodone-acetaminophen (NORCO) 7.5-325 MG tablet (Start on 04/06/2019)   Facet arthritis of lumbosacral region       Relevant Medications   triamcinolone acetonide (KENALOG-40) injection 40 mg (Completed)   HYDROcodone-acetaminophen (NORCO) 7.5-325 MG tablet (Start on 04/06/2019)        ----------------------------------------------------------------------------------------------------------------------  1. Bilateral sciatica We will proceed with an epidural steroid injection today.  Originally we were going to do defer secondary to the flu shot however she has been off of her Plavix as well.  We talked about the risks and benefits of this and its effect on the efficacy of the flu vaccine and based on our decision we feel that it would be prudent to proceed secondary to the discontinuation of the Plavix.  I do not want her coming off Plavix again if not necessary.  On return to clinic in approximately 2 months for reevaluation.  We may consider repeat epidural in 1 to 2 months if indicated.  2. DDD (degenerative disc disease), lumbar As above and  continue core stretching strengthening exercises.  We have had long discussions about weight loss but have seen no significant progress  there  3. Spinal stenosis of lumbar region with neurogenic claudication .  As above  4. Lumbar spondylosis with myelopathy   5. Chronic, continuous use of opioids As above I have reviewed the [facility] practitioner database information and it is appropriate.  We will give refills today for November 9 and December 9  6. Chronic pain syndrome As above  7. Facet arthritis of lumbosacral region     ----------------------------------------------------------------------------------------------------------------------  I am having Velora Heckler. Fare maintain her tiotropium, traZODone, albuterol, insulin aspart, insulin glargine, Calcium Carbonate-Vitamin D3, albuterol, montelukast, cetirizine, nitroGLYCERIN, meloxicam, levETIRAcetam, sertraline, Combivent Respimat, B-D UF III MINI PEN NEEDLES, atorvastatin, carvedilol, isosorbide mononitrate, losartan, clopidogrel, famotidine, torsemide, mometasone-formoterol, predniSONE, potassium chloride SA, and HYDROcodone-acetaminophen. We administered triamcinolone acetonide, sodium chloride flush, ropivacaine (PF) 2 mg/mL (0.2%), lidocaine (PF), and iopamidol.   Meds ordered this encounter  Medications  . triamcinolone acetonide (KENALOG-40) injection 40 mg  . sodium chloride flush (NS) 0.9 % injection 10 mL  . ropivacaine (PF) 2 mg/mL (0.2%) (NAROPIN) injection 10 mL  . lidocaine (PF) (XYLOCAINE) 1 % injection 5 mL  . lactated ringers infusion 1,000 mL  . iopamidol (ISOVUE-M) 41 % intrathecal injection 20 mL  . HYDROcodone-acetaminophen (NORCO) 7.5-325 MG tablet    Sig: Take 1 tablet by mouth 3 (three) times daily.    Dispense:  90 tablet    Refill:  0   Patient's Medications  New Prescriptions   No medications on file  Previous Medications   ALBUTEROL (PROVENTIL HFA;VENTOLIN HFA) 108 (90 BASE) MCG/ACT  INHALER    Inhale 2 puffs into the lungs every 6 (six) hours as needed for wheezing or shortness of breath.   ALBUTEROL (PROVENTIL) (2.5 MG/3ML) 0.083% NEBULIZER SOLUTION    Take 2.5 mg every 4 (four) hours as needed by nebulization for wheezing or shortness of breath.   ATORVASTATIN (LIPITOR) 40 MG TABLET    Take 1 tablet (40 mg total) by mouth at bedtime.   B-D UF III MINI PEN NEEDLES 31G X 5 MM MISC    USE WITH INSULIN PEN INJECTIONS TWICE DAILY   CALCIUM CARBONATE-VITAMIN D3 (CALCIUM 600-D) 600-400 MG-UNIT TABS    Take 1 tablet by mouth daily.    CARVEDILOL (COREG) 3.125 MG TABLET    Take 1 tablet (3.125 mg total) by mouth 2 (two) times daily with a meal.   CETIRIZINE (ZYRTEC) 10 MG TABLET    Take 10 mg by mouth daily.   CLOPIDOGREL (PLAVIX) 75 MG TABLET    Take 1 tablet (75 mg total) by mouth daily.   COMBIVENT RESPIMAT 20-100 MCG/ACT AERS RESPIMAT    Inhale 1 puff into the lungs 4 (four) times daily as needed for wheezing.    FAMOTIDINE (PEPCID) 20 MG TABLET    Take 20 mg by mouth at bedtime.   INSULIN ASPART (NOVOLOG) 100 UNIT/ML INJECTION    Inject 10 Units into the skin 3 (three) times daily.    INSULIN GLARGINE (LANTUS) 100 UNIT/ML INJECTION    Inject 30-40 Units into the skin See admin instructions. Inject 30 units in the morning at 40 units at bedtime.   ISOSORBIDE MONONITRATE (IMDUR) 30 MG 24 HR TABLET    Take 1 tablet (30 mg total) by mouth 2 (two) times daily.   LEVETIRACETAM (KEPPRA) 750 MG TABLET    Take 750 mg by mouth 2 (two) times daily.   LOSARTAN (COZAAR) 100 MG TABLET    Take 1 tablet (100 mg total) by mouth daily.  MELOXICAM (MOBIC) 7.5 MG TABLET    Take 7.5 mg by mouth daily as needed for pain.    MOMETASONE-FORMOTEROL (DULERA) 200-5 MCG/ACT AERO    Inhale 2 puffs into the lungs 2 (two) times daily.   MONTELUKAST (SINGULAIR) 10 MG TABLET    Take 10 mg by mouth daily.    NITROGLYCERIN (NITROSTAT) 0.4 MG SL TABLET    DISSOLVE ONE TABLET UNDER THE TONGUE EVERY 5 MINUTES AS  NEEDED FOR CHEST PAIN.  DO NOT EXCEED A TOTAL OF 3 DOSES IN 15 MINUTES   POTASSIUM CHLORIDE SA (KLOR-CON) 20 MEQ TABLET    Take 2 tablets (40 mEq total) by mouth 2 (two) times daily. Take extra 2 tablets (40 meq) if you take metolazone   PREDNISONE (STERAPRED UNI-PAK 21 TAB) 10 MG (21) TBPK TABLET    Take 6 tabs first day, 5 tab on day 2, then 4 on day 3rd, 3 tabs on day 4th , 2 tab on day 5th, and 1 tab on 6th day.   SERTRALINE (ZOLOFT) 50 MG TABLET    Take 50 mg by mouth daily.   TIOTROPIUM (SPIRIVA) 18 MCG INHALATION CAPSULE    Place 18 mcg into inhaler and inhale daily.   TORSEMIDE (DEMADEX) 20 MG TABLET    Take 60 mg by mouth 2 (two) times daily.   TRAZODONE (DESYREL) 50 MG TABLET    Take 50 mg by mouth at bedtime.  Modified Medications   Modified Medication Previous Medication   HYDROCODONE-ACETAMINOPHEN (NORCO) 7.5-325 MG TABLET HYDROcodone-acetaminophen (NORCO) 7.5-325 MG tablet      Take 1 tablet by mouth 3 (three) times daily.    Take 1 tablet by mouth 3 (three) times daily.  Discontinued Medications   No medications on file   ----------------------------------------------------------------------------------------------------------------------  Follow-up: Return in about 2 months (around 04/16/2019) for evaluation, med refill.   Procedure: L5-S1ESI with fluoroscopic guidance and no moderate sedation  NOTE: The risks, benefits, and expectations of the procedure have been discussed and explained to the patient who was understanding and in agreement with suggested treatment plan. No guarantees were made.  DESCRIPTION OF PROCEDURE: Lumbar epidural steroid injection with no IV Versed, EKG, blood pressure, pulse, and pulse oximetry monitoring. The procedure was performed with the patient in the prone position under fluoroscopic guidance.  Sterile prep x3 was initiated and I then injected subcutaneous lidocaine to the overlying L5-S1 site after its fluoroscopic identifictation.  Using strict  aseptic technique, I then advanced an 18-gauge Tuohy epidural needle in the midline using interlaminar approach via loss-of-resistance to saline technique. There was negative aspiration for heme or  CSF.  I then confirmed position with both AP and Lateral fluoroscan.  2 cc of contrast dye were injected and a  total of 5 mL of Preservative-Free normal saline mixed with 40 mg of Kenalog and 1cc Ropicaine 0.2 percent were injected incrementally via the  epidurally placed needle. The needle was removed. The patient tolerated the injection well and was convalesced and discharged to home in stable condition. Should the patient have any post procedure difficulty they have been instructed on how to contact us for assistance.    Molli Barrows, MD

## 2019-02-14 NOTE — Patient Instructions (Signed)

## 2019-02-14 NOTE — Progress Notes (Signed)
Safety precautions to be maintained throughout the outpatient stay will include: orient to surroundings, keep bed in low position, maintain call bell within reach at all times, provide assistance with transfer out of bed and ambulation.  

## 2019-02-15 ENCOUNTER — Telehealth: Payer: Self-pay | Admitting: Anesthesiology

## 2019-02-15 NOTE — Telephone Encounter (Signed)
Please call patient to discuss meds, Tina Hayes says Dr. Andree Elk was supposed to call in something for her anxiety, and also Tina Hayes doesn't have a refill on meds until 04-06-19. Is this correct?

## 2019-02-15 NOTE — Telephone Encounter (Signed)
Patient states her PCP gave her something for anxiety. Will ask Dr. Andree Elk about November script for Hydrocodone.

## 2019-02-16 ENCOUNTER — Ambulatory Visit: Payer: Medicare HMO | Attending: Family | Admitting: Family

## 2019-02-16 ENCOUNTER — Other Ambulatory Visit: Payer: Self-pay

## 2019-02-16 ENCOUNTER — Encounter: Payer: Self-pay | Admitting: Family

## 2019-02-16 VITALS — BP 132/70 | HR 55 | Resp 18 | Ht 71.0 in | Wt 307.6 lb

## 2019-02-16 DIAGNOSIS — Z823 Family history of stroke: Secondary | ICD-10-CM | POA: Diagnosis not present

## 2019-02-16 DIAGNOSIS — I509 Heart failure, unspecified: Secondary | ICD-10-CM | POA: Diagnosis present

## 2019-02-16 DIAGNOSIS — Z951 Presence of aortocoronary bypass graft: Secondary | ICD-10-CM | POA: Insufficient documentation

## 2019-02-16 DIAGNOSIS — E119 Type 2 diabetes mellitus without complications: Secondary | ICD-10-CM | POA: Diagnosis not present

## 2019-02-16 DIAGNOSIS — J449 Chronic obstructive pulmonary disease, unspecified: Secondary | ICD-10-CM

## 2019-02-16 DIAGNOSIS — Z886 Allergy status to analgesic agent status: Secondary | ICD-10-CM | POA: Diagnosis not present

## 2019-02-16 DIAGNOSIS — Z87891 Personal history of nicotine dependence: Secondary | ICD-10-CM | POA: Insufficient documentation

## 2019-02-16 DIAGNOSIS — I252 Old myocardial infarction: Secondary | ICD-10-CM | POA: Diagnosis not present

## 2019-02-16 DIAGNOSIS — I5032 Chronic diastolic (congestive) heart failure: Secondary | ICD-10-CM | POA: Insufficient documentation

## 2019-02-16 DIAGNOSIS — Z794 Long term (current) use of insulin: Secondary | ICD-10-CM

## 2019-02-16 DIAGNOSIS — Z8249 Family history of ischemic heart disease and other diseases of the circulatory system: Secondary | ICD-10-CM | POA: Diagnosis not present

## 2019-02-16 DIAGNOSIS — Z833 Family history of diabetes mellitus: Secondary | ICD-10-CM | POA: Insufficient documentation

## 2019-02-16 DIAGNOSIS — I2510 Atherosclerotic heart disease of native coronary artery without angina pectoris: Secondary | ICD-10-CM | POA: Diagnosis not present

## 2019-02-16 DIAGNOSIS — I110 Hypertensive heart disease with heart failure: Secondary | ICD-10-CM | POA: Insufficient documentation

## 2019-02-16 DIAGNOSIS — I10 Essential (primary) hypertension: Secondary | ICD-10-CM

## 2019-02-16 NOTE — Patient Instructions (Signed)
Continue weighing daily and call for an overnight weight gain of > 2 pounds or a weekly weight gain of >5 pounds. 

## 2019-02-16 NOTE — Progress Notes (Signed)
Patient ID: Tina Hayes, female    DOB: 04/15/1946, 73 y.o.   MRN: [id number]  HPI  Tina Hayes is a 73 y/o female with a history of seizures, HTN, cardiac stents, DM, depression, CAD, COPD, chronic back pain, anxiety, obesity, remote tobacco use and chronic heart failure.   Echo reviewed from 02/02/2019 and showed an EF 60-65%. Echo was done 06/17/16 and showed an EF of 60-65% along with mild MR and moderate TR. Pulmonary systolic pressure was mildly to moderately elevated. Stress test done 06/18/16.  Admitted 02/01/2019 for COPD exacerbation. She was treated with IV solumedrol and discharged after 2 days. She was admitted 01/17/2019 for COPD exacerbation. She was treated with IV solumedrol and discharged after 2 days.   She presents today for her follow-up visit with a chief complaint of improving shortness of breath with moderate exertion. She was in the hospital twice recently for a COPD exacerbation and saw her pulmonologist yesterday. She received an IM solumedrol injection and he changed some nebulizers. She states last night her sugar went up to 561 and she had an episode of chest pain with her sugar that high. She took 2 nitroglycerin which relieved the pain. She also notes chronic left knee and back pain and right leg numbness. She denies fatigue, cough, leg swelling, palpitations, abdominal distention, dizziness, and trouble sleeping. She weighs herself every day and her weight is stable. She does not add salt to her food. However, she states her mouth is frequently dry and she drinks 1 small cup of coffee, a nutrition shake, and 6-16 ounce bottles of water every day.   Past Medical History:  Diagnosis Date  . (HFpEF) heart failure with preserved ejection fraction (Tina Hayes)    a. 05/2016 Echo: EF 60-65%, mild to mod LVH, Gr1 DD, mild MR, mildly dil LA, mod TR, mildly to mod increased PASP.  [name] Acute diastolic heart failure (Tina Hayes) 01/27/2017  . Anxiety   . Chest pain 06/16/2016  . CHF (congestive heart  failure) ([place])   . Chronic back pain   . Chronic diastolic congestive heart failure (Tina Hayes) 02/13/2017  . COPD (chronic obstructive pulmonary disease) (Tina Hayes)   . Coronary artery disease    a. s/p remote PCI x 5;  b. 2006 s/p CABG x 3 ([place], [place]); b. 05/2016 MV: attenuation corrected images w/o ischemia or wma-->Med rx.  . Coronary artery disease of native artery of native heart with stable angina pectoris (Tina Hayes) 06/17/2016  . Depression   . Diabetes mellitus without complication (Tina Hayes)   . Essential hypertension 06/30/2016  . Heart attack (Tina Hayes)    Total of 3 per pt.  . Hypertension   . Hypertensive urgency 06/03/2015  . Seizure (Tina Hayes)   . Seizures (Tina Hayes)    Past Surgical History:  Procedure Laterality Date  . ABDOMINAL HYSTERECTOMY    . ANKLE SURGERY Right   . CATARACT EXTRACTION W/ INTRAOCULAR LENS  IMPLANT, BILATERAL    . COLONOSCOPY WITH PROPOFOL N/A 05/04/2017   Procedure: COLONOSCOPY WITH PROPOFOL;  Surgeon: Tina Silvas, MD;  Location: [facility] ENDOSCOPY;  Service: Endoscopy;  Laterality: N/A;  . CORONARY ANGIOPLASTY    . CORONARY ARTERY BYPASS GRAFT     TRIPLE BYPASS  . ESOPHAGOGASTRODUODENOSCOPY (EGD) WITH PROPOFOL N/A 05/04/2017   Procedure: ESOPHAGOGASTRODUODENOSCOPY (EGD) WITH PROPOFOL;  Surgeon: Tina Silvas, MD;  Location: [facility] ENDOSCOPY;  Service: Endoscopy;  Laterality: N/A;  . EXCISION MASS LOWER EXTREMETIES Right 03/02/2018   Procedure: EXCISION SOFT TISSUE MASS  FROM MEDIAL ASPECT OF RIGHT ANKLE;  Surgeon: Tina Mull, MD;  Location: [facility] ORS;  Service: Orthopedics;  Laterality: Right;  . FRACTURE SURGERY    . KNEE SURGERY Right   . MOUTH SURGERY Left 07/14/2017   Family History  Problem Relation Age of Onset  . Diabetes Other   . Hypertension Other   . Diabetes Mother   . Heart failure Mother   . Heart disease Mother   . Heart attack Mother   . Stroke Mother   . Depression Mother   . Hypertension Mother   . Cancer Sister         brain  . Hypertension Sister   . Diabetes Brother   . Hypertension Brother   . Heart failure Sister   . Heart attack Sister   . SIDS Sister    Social History   Tobacco Use  . Smoking status: Former Smoker    Packs/day: 0.25    Years: 20.00    Pack years: 5.00    Types: Cigarettes    Quit date: 07/01/2006    Years since quitting: 12.6  . Smokeless tobacco: Never Used  Substance Use Topics  . Alcohol use: No    Alcohol/week: 0.0 standard drinks   Allergies  Allergen Reactions  . Aspirin Anaphylaxis    Review of Systems  Constitutional: Negative for appetite change and fatigue.  HENT: Negative for congestion, postnasal drip and trouble swallowing.   Eyes: Negative.   Respiratory: Positive for shortness of breath (starting to improve). Negative for cough and chest tightness.   Cardiovascular: Positive for chest pain (last night when sugar 561). Negative for palpitations and leg swelling.  Gastrointestinal: Negative for abdominal distention, abdominal pain and nausea.  Endocrine: Negative.   Genitourinary: Negative.   Musculoskeletal: Positive for arthralgias (left knee) and back pain (chronic).  Skin: Negative.   Allergic/Immunologic: Negative.   Neurological: Positive for numbness (right leg). Negative for dizziness, weakness, light-headedness and headaches.  Hematological: Negative for adenopathy. Does not bruise/bleed easily.  Psychiatric/Behavioral: Negative for dysphoric mood, sleep disturbance (sleeping on 3 pillows) and suicidal ideas. The patient is not nervous/anxious.    Vitals:   02/16/19 1142  BP: 132/70  Pulse: (!) 55  Resp: 18  SpO2: 100%   Filed Weights   02/16/19 1142  Weight: (!) 307 lb 9.6 oz (139.5 kg)   Lab Results  Component Value Date   CREATININE 1.12 (H) 02/02/2019   CREATININE 1.24 (H) 02/01/2019   CREATININE 1.09 (H) 01/18/2019    Physical Exam  Constitutional: She is oriented to person, place, and time. She appears well-developed and  well-nourished.  HENT:  Head: Normocephalic and atraumatic.  Neck: Normal range of motion. Neck supple. No JVD present.  Cardiovascular: Normal rate and regular rhythm.  Pulmonary/Chest: Effort normal. She has no wheezes. She has no rales.  Abdominal: Soft. She exhibits no distension. There is no abdominal tenderness.  Musculoskeletal:        General: Edema (trace edema bilateral lower extremities) present. No tenderness.  Neurological: She is alert and oriented to person, place, and time.  Skin: Skin is warm and dry.  Psychiatric: She has a normal mood and affect. Her behavior is normal. Thought content normal.  Nursing note and vitals reviewed.  Assessment & Plan:  1: Chronic heart failure with preserved ejection fraction- - NYHA class II - euvolemic today - weighing daily; Reminded to call for an overnight weight gain of >2 pounds or a weekly weight gain of >  5 pounds.  - weight up 5 pounds since last visit 7 months ago. - has not been adding salt to her foods and is trying to eat low sodium foods - discussed other options to help with dry mouth such as ice chips and sugar free hard candy. Encouraged her to keep her fluids limited to ~60-64 ounces per day  - saw her cardiologist Rockey Situ) 08/17/2018 via telemedicine - BNP 02/01/2019 was 64.0 - reports receiving her flu vaccine for this season  2: HTN- - BP looks good today - BMP from 02/02/2019 reviewed; sodium 139, potassium 3.3, creatinine 1.12 and GFR 57 - sees PCP at the [facility]  3: Diabetes-  - fasting glucose at home was 246. Currently elevated with steroids for COPD exacerbation - A1c on 01/18/2019 was 6.5%  4: COPD- - breathing is improved today - saw pulmonologist Lanney Gins) 02/15/2019  Patient did not bring her medications nor a list. Each medication was verbally reviewed with the patient and she was encouraged to bring the bottles to every visit to confirm accuracy of list.  Return in 3 months or  sooner for any questions/problems before then.

## 2019-02-17 ENCOUNTER — Encounter: Payer: Self-pay | Admitting: Family

## 2019-02-25 ENCOUNTER — Other Ambulatory Visit: Payer: Self-pay | Admitting: Pulmonary Disease

## 2019-02-25 DIAGNOSIS — R0602 Shortness of breath: Secondary | ICD-10-CM

## 2019-02-25 DIAGNOSIS — J4540 Moderate persistent asthma, uncomplicated: Secondary | ICD-10-CM

## 2019-02-25 DIAGNOSIS — R0609 Other forms of dyspnea: Secondary | ICD-10-CM

## 2019-02-25 DIAGNOSIS — I509 Heart failure, unspecified: Secondary | ICD-10-CM

## 2019-03-07 ENCOUNTER — Other Ambulatory Visit: Payer: Self-pay

## 2019-03-07 ENCOUNTER — Other Ambulatory Visit
Admission: RE | Admit: 2019-03-07 | Discharge: 2019-03-07 | Disposition: A | Discharge status: Home / Self Care | Payer: Medicare HMO | Source: Ambulatory Visit | Attending: Pulmonary Disease | Admitting: Pulmonary Disease

## 2019-03-07 DIAGNOSIS — Z01812 Encounter for preprocedural laboratory examination: Secondary | ICD-10-CM | POA: Diagnosis present

## 2019-03-07 DIAGNOSIS — Z20828 Contact with and (suspected) exposure to other viral communicable diseases: Secondary | ICD-10-CM | POA: Insufficient documentation

## 2019-03-07 LAB — SARS CORONAVIRUS 2 (TAT 6-24 HRS): SARS Coronavirus 2: NEGATIVE

## 2019-03-08 ENCOUNTER — Ambulatory Visit: Payer: Medicare HMO | Attending: Pulmonary Disease

## 2019-03-08 DIAGNOSIS — I509 Heart failure, unspecified: Secondary | ICD-10-CM | POA: Diagnosis not present

## 2019-03-08 DIAGNOSIS — R0600 Dyspnea, unspecified: Secondary | ICD-10-CM | POA: Diagnosis not present

## 2019-03-08 DIAGNOSIS — J4540 Moderate persistent asthma, uncomplicated: Secondary | ICD-10-CM | POA: Insufficient documentation

## 2019-03-08 DIAGNOSIS — R0609 Other forms of dyspnea: Secondary | ICD-10-CM

## 2019-03-08 DIAGNOSIS — R0602 Shortness of breath: Secondary | ICD-10-CM | POA: Diagnosis not present

## 2019-03-08 LAB — BLOOD GAS, ARTERIAL
Acid-Base Excess: 3.1 mmol/L — ABNORMAL HIGH (ref 0.0–2.0)
Bicarbonate: 27 mmol/L (ref 20.0–28.0)
FIO2: 0.21
O2 Saturation: 97.6 %
Patient temperature: 37
pCO2 arterial: 38 mmHg (ref 32.0–48.0)
pH, Arterial: 7.46 — ABNORMAL HIGH (ref 7.350–7.450)
pO2, Arterial: 93 mmHg (ref 83.0–108.0)

## 2019-03-17 ENCOUNTER — Telehealth: Payer: Self-pay | Admitting: Family

## 2019-03-17 MED ORDER — METOLAZONE 2.5 MG PO TABS: 2.5000 mg | ORAL_TABLET | Freq: Every day | ORAL | 0 refills | Status: DC

## 2019-03-17 NOTE — Telephone Encounter (Signed)
Returned patient's call regarding her weight gain and worsening shortness of breath. She says that in the last 2 days, she's gained 6 pounds. She did weigh 299 pounds and today weighed 305 pounds. She says that her shortness of breath and swelling are worse and she's experiencing some chest tightness. She also feels like she's getting cold type symptoms  She is currently taking torsemide 60mg  BID along with potassium 37meq BID. When asking about diet, she says that she's not adding salt but is drinking "a lot of fluids" because her mouth stays dry "all the time". When asking specifically how much, she says that she drinks 7 sixteen ounce bottles of water daily along with 3-4 twenty ounce gatorades and coffee and juice. Explained that she is drinking way too much fluids and just her water intake is well above what she should be drinking in a day. Explained the importance of keeping daily fluid intake to 60 ounces daily.   Will send in metolazone 2.5mg  every other day for 3 doses. Should her weight decline and her symptoms improve, advised her that she doesn't need to take all 3 doses. When she takes the metolazone, she is to take an additional 69meq potassium as she's been hypokalemic in the past.   Also advised patient that should her symptoms worsen, that she should present to the ED. Due to her coming via transportation services, they require 3 business days notice so the earliest they can bring her is 03/23/2019.   Patient verbalized understanding of medications, upcoming appointment as well presenting to the ED if symptoms worsen.

## 2019-03-22 NOTE — Progress Notes (Signed)
Patient ID: Tina Hayes, female    DOB: 1946-02-16, 73 y.o.   MRN: [id number]  HPI  [name] is a 73 y/o female with a history of seizures, HTN, cardiac stents, DM, depression, CAD, COPD, chronic back pain, anxiety, obesity, remote tobacco use and chronic heart failure.   Echo report from 02/02/2019 reviewed and showed an EF of 60-65% along with moderate MR/TR and moderately elevated PA pressure. Echo done 06/17/16 and showed an EF of 60-65% along with mild MR and moderate TR. Pulmonary systolic pressure was mildly to moderately elevated. Stress test done 06/18/16.  Admitted 02/01/2019 due to COPD exacerbation. Pulmonology consult obtained. IV solumedrol, antibiotics and nebulizer given. Discharged after 2 days. Admitted 01/17/2019 due to COPD exacerbation. Given IV steroids, nebulizer and antibiotics. Discharged after 2 days.   She presents today for her follow-up visit with a chief complaint of minimal shortness of breath upon moderate exertion. She describes this as chronic in nature having been present for several years. She has associated fatigue, cough, intermittent chest tightness, light-headedness, depression and chronic pain along with this. She denies any difficulty sleeping, abdominal distention, palpitations, pedal edema, chest pain or weight gain.   Has taken 2 doses of 2.5mg  metolazone along with extra potassium over this past weekend and feels better.   Past Medical History:  Diagnosis Date  . (HFpEF) heart failure with preserved ejection fraction (Tina Hayes)    a. 05/2016 Echo: EF 60-65%, mild to mod LVH, Gr1 DD, mild MR, mildly dil LA, mod TR, mildly to mod increased PASP.  [name] Acute diastolic heart failure ([name]) 01/27/2017  . Anxiety   . Chest pain 06/16/2016  . CHF (congestive heart failure) ([place])   . Chronic back pain   . Chronic diastolic congestive heart failure (Tina Hayes) 02/13/2017  . COPD (chronic obstructive pulmonary disease) (Tina Hayes)   . Coronary artery disease    a. s/p remote PCI x  5;  b. 2006 s/p CABG x 3 ([place], [place]); b. 05/2016 MV: attenuation corrected images w/o ischemia or wma-->Med rx.  . Coronary artery disease of native artery of native heart with stable angina pectoris ([name]) 06/17/2016  . Depression   . Diabetes mellitus without complication (Tina Hayes)   . Essential hypertension 06/30/2016  . Heart attack (Tina Hayes)    Total of 3 per pt.  . Hypertension   . Hypertensive urgency 06/03/2015  . Seizure (Tina Hayes)   . Seizures (Tina Hayes)    Past Surgical History:  Procedure Laterality Date  . ABDOMINAL HYSTERECTOMY    . ANKLE SURGERY Right   . CATARACT EXTRACTION W/ INTRAOCULAR LENS  IMPLANT, BILATERAL    . COLONOSCOPY WITH PROPOFOL N/A 05/04/2017   Procedure: COLONOSCOPY WITH PROPOFOL;  Surgeon: [name], MD;  Location: [facility] ENDOSCOPY;  Service: Endoscopy;  Laterality: N/A;  . CORONARY ANGIOPLASTY    . CORONARY ARTERY BYPASS GRAFT     TRIPLE BYPASS  . ESOPHAGOGASTRODUODENOSCOPY (EGD) WITH PROPOFOL N/A 05/04/2017   Procedure: ESOPHAGOGASTRODUODENOSCOPY (EGD) WITH PROPOFOL;  Surgeon: [name], MD;  Location: [facility] ENDOSCOPY;  Service: Endoscopy;  Laterality: N/A;  . EXCISION MASS LOWER EXTREMETIES Right 03/02/2018   Procedure: EXCISION SOFT TISSUE MASS FROM MEDIAL ASPECT OF RIGHT ANKLE;  Surgeon: [name], MD;  Location: [facility] ORS;  Service: Orthopedics;  Laterality: Right;  . FRACTURE SURGERY    . KNEE SURGERY Right   . MOUTH SURGERY Left 07/14/2017   Family History  Problem Relation Age of Onset  . Diabetes  Other   . Hypertension Other   . Diabetes Mother   . Heart failure Mother   . Heart disease Mother   . Heart attack Mother   . Stroke Mother   . Depression Mother   . Hypertension Mother   . Cancer Sister        brain  . Hypertension Sister   . Diabetes Brother   . Hypertension Brother   . Heart failure Sister   . Heart attack Sister   . SIDS Sister    Social History   Tobacco Use  . Smoking status: Former  Smoker    Packs/day: 0.25    Years: 20.00    Pack years: 5.00    Types: Cigarettes    Quit date: 07/01/2006    Years since quitting: 12.7  . Smokeless tobacco: Never Used  Substance Use Topics  . Alcohol use: No    Alcohol/week: 0.0 standard drinks   Allergies  Allergen Reactions  . Aspirin Anaphylaxis   Prior to Admission medications   Medication Sig Start Date End Date Taking? Authorizing Provider  albuterol (PROVENTIL HFA;VENTOLIN HFA) 108 (90 Base) MCG/ACT inhaler Inhale 2 puffs into the lungs every 6 (six) hours as needed for wheezing or shortness of breath. 08/30/15  Yes Epifanio Lesches, MD  albuterol (PROVENTIL) (2.5 MG/3ML) 0.083% nebulizer solution Take 2.5 mg every 4 (four) hours as needed by nebulization for wheezing or shortness of breath.   Yes [provider]  ALPRAZolam Duanne Moron) 0.5 MG tablet Take 0.5 mg by mouth at bedtime.   Yes [provider]  atorvastatin (LIPITOR) 40 MG tablet Take 1 tablet (40 mg total) by mouth at bedtime. 08/17/18  Yes Gollan, Kathlene November, MD  B-D UF III MINI PEN NEEDLES 31G X 5 MM MISC USE WITH INSULIN PEN INJECTIONS TWICE DAILY 08/05/18  Yes [provider]  budesonide (PULMICORT) 0.5 MG/2ML nebulizer solution Take 0.5 mg by nebulization daily.   Yes [provider]  Calcium Carbonate-Vitamin D3 (CALCIUM 600-D) 600-400 MG-UNIT TABS Take 1 tablet by mouth daily.    Yes [provider]  carvedilol (COREG) 3.125 MG tablet Take 1 tablet (3.125 mg total) by mouth 2 (two) times daily with a meal. 08/17/18  Yes Gollan, Kathlene November, MD  cetirizine (ZYRTEC) 10 MG tablet Take 10 mg by mouth daily.   Yes [provider]  clopidogrel (PLAVIX) 75 MG tablet Take 1 tablet (75 mg total) by mouth daily. 08/17/18  Yes Gollan, Kathlene November, MD  COMBIVENT RESPIMAT 20-100 MCG/ACT AERS respimat Inhale 1 puff into the lungs 4 (four) times daily as needed for wheezing.  07/30/18  Yes [provider]  famotidine (PEPCID)  20 MG tablet Take 20 mg by mouth at bedtime.   Yes [provider]  HYDROcodone-acetaminophen (NORCO) 7.5-325 MG tablet Take 1 tablet by mouth 3 (three) times daily. 04/06/19 05/06/19 Yes Molli Barrows, MD  insulin aspart (NOVOLOG) 100 UNIT/ML injection Inject 10 Units into the skin 3 (three) times daily.    Yes [provider]  insulin glargine (LANTUS) 100 UNIT/ML injection Inject 30-40 Units into the skin See admin instructions. Inject 30 units in the morning at 40 units at bedtime.   Yes [provider]  isosorbide mononitrate (IMDUR) 30 MG 24 hr tablet Take 1 tablet (30 mg total) by mouth 2 (two) times daily. 08/17/18  Yes Minna Merritts, MD  levETIRAcetam (KEPPRA) 750 MG tablet Take 750 mg by mouth 2 (two) times daily.  Yes [provider]  losartan (COZAAR) 100 MG tablet Take 1 tablet (100 mg total) by mouth daily. 08/17/18  Yes Gollan, Kathlene November, MD  meloxicam (MOBIC) 7.5 MG tablet Take 7.5 mg by mouth daily as needed for pain.    Yes [provider]  metolazone (ZAROXOLYN) 2.5 MG tablet Take 1 tablet (2.5 mg total) by mouth daily. 03/17/19 06/15/19 Yes Hackney, Tina A, FNP  mometasone-formoterol (DULERA) 200-5 MCG/ACT AERO Inhale 2 puffs into the lungs 2 (two) times daily. 02/03/19  Yes Vaughan Basta, MD  montelukast (SINGULAIR) 10 MG tablet Take 10 mg by mouth daily.  11/05/17  Yes [provider]  nitroGLYCERIN (NITROSTAT) 0.4 MG SL tablet DISSOLVE ONE TABLET UNDER THE TONGUE EVERY 5 MINUTES AS NEEDED FOR CHEST PAIN.  DO NOT EXCEED A TOTAL OF 3 DOSES IN 15 MINUTES Patient taking differently: Place 0.4 mg under the tongue every 5 (five) minutes as needed for chest pain.  07/09/18  Yes Gollan, Kathlene November, MD  potassium chloride SA (KLOR-CON) 20 MEQ tablet Take 2 tablets (40 mEq total) by mouth 2 (two) times daily. Take extra 2 tablets (40 meq) if you take metolazone 02/06/19  Yes Hackney, Otila Kluver A, FNP  sertraline (ZOLOFT) 50 MG tablet  Take 50 mg by mouth daily.   Yes [provider]  tiotropium (SPIRIVA) 18 MCG inhalation capsule Place 18 mcg into inhaler and inhale daily.   Yes [provider]  torsemide (DEMADEX) 20 MG tablet Take 3 tablets (60 mg total) by mouth 2 (two) times daily. 03/23/19  Yes Hackney, Otila Kluver A, FNP  traZODone (DESYREL) 50 MG tablet Take 50 mg by mouth at bedtime.   Yes [provider]     Review of Systems  Constitutional: Positive for fatigue. Negative for appetite change.  HENT: Negative for congestion, postnasal drip and trouble swallowing.   Eyes: Negative.   Respiratory: Positive for cough, chest tightness and shortness of breath.   Cardiovascular: Negative for chest pain, palpitations and leg swelling.  Gastrointestinal: Negative for abdominal distention, abdominal pain and nausea.  Endocrine: Negative.   Genitourinary: Negative.   Musculoskeletal: Positive for arthralgias (left knee) and back pain (chronic).  Skin: Negative.   Allergic/Immunologic: Negative.   Neurological: Positive for light-headedness, numbness (right leg) and headaches. Negative for dizziness and weakness.  Hematological: Negative for adenopathy. Does not bruise/bleed easily.  Psychiatric/Behavioral: Positive for dysphoric mood (improving). Negative for sleep disturbance (sleeping on 3 pillows) and suicidal ideas. The patient is not nervous/anxious.    Vitals:   03/23/19 1010  BP: (!) 135/95  Pulse: 68  Resp: 16  SpO2: 97%  Weight: (!) 302 lb 6.4 oz (137.2 kg)  Height: 5\' 11"  (1.803 m)   Wt Readings from Last 3 Encounters:  03/23/19 (!) 302 lb 6.4 oz (137.2 kg)  02/16/19 (!) 307 lb 9.6 oz (139.5 kg)  02/14/19 294 lb (133.4 kg)   Lab Results  Component Value Date   CREATININE 1.12 (H) 02/02/2019   CREATININE 1.24 (H) 02/01/2019   CREATININE 1.09 (H) 01/18/2019     Physical Exam  Constitutional: She is oriented to person, place, and time. She appears well-developed and  well-nourished.  HENT:  Head: Normocephalic and atraumatic.  Neck: Normal range of motion. Neck supple. No JVD present.  Cardiovascular: Normal rate and regular rhythm.  Pulmonary/Chest: Effort normal. She has no wheezes. She has no rales.  Abdominal: Soft. She exhibits no distension. There is no abdominal tenderness.  Musculoskeletal:  General: No tenderness or edema.  Neurological: She is alert and oriented to person, place, and time.  Skin: Skin is warm and dry.  Psychiatric: She has a normal mood and affect. Her behavior is normal. Thought content normal.  Nursing note and vitals reviewed.  Assessment & Plan:  1: Chronic heart failure with preserved ejection fraction- - NYHA class II - euvolemic today - weighing daily; Reminded to call for an overnight weight gain of >2 pounds or a weekly weight gain of >5 pounds.  - weight down 5 pounds from here last visit here 1 month ago - has not been adding salt to her foods and is trying to eat low sodium foods - will get BMP today since she's taken metolazone/ extra potasisum  - trying to keep fluid intake closer to 60 ounces daily  - saw her cardiologist Rockey Situ) 11/09/17 - BNP 02/01/2019 was 64.0 - reports receiving her flu vaccine for this season - currently has [facility] through her insurance; discussed paramedic program but she'd like to wait until after the first of the year  2: HTN- - BP mildly elevated today but patient says that she's under a lot of stress at home due to family dynamics - BMP from 02/02/2019 reviewed; sodium 139, potassium 3.3, creatinine 1.12 and GFR 57 - sees PCP at the [facility]  3: Diabetes-  - fasting glucose at home this morning was 264 - A1c on 01/18/2019 was 6.5%  4: COPD- - saw pulmonology Lanney Gins) 03/08/2019 - she thinks she's having a sleep study done next month - has been started on budesonide nebulizer  Patient did not bring her medications nor a list. Each  medication was verbally reviewed with the patient and she was encouraged to bring the bottles to every visit to confirm accuracy of list.  Return in 6 weeks or sooner for any questions/problems before then.

## 2019-03-23 ENCOUNTER — Ambulatory Visit: Payer: Medicare HMO | Attending: Family | Admitting: Family

## 2019-03-23 ENCOUNTER — Other Ambulatory Visit: Payer: Self-pay

## 2019-03-23 ENCOUNTER — Telehealth: Payer: Self-pay | Admitting: Family

## 2019-03-23 ENCOUNTER — Encounter: Payer: Self-pay | Admitting: Family

## 2019-03-23 VITALS — BP 135/95 | HR 68 | Resp 16 | Ht 71.0 in | Wt 302.4 lb

## 2019-03-23 DIAGNOSIS — E119 Type 2 diabetes mellitus without complications: Secondary | ICD-10-CM

## 2019-03-23 DIAGNOSIS — Z8249 Family history of ischemic heart disease and other diseases of the circulatory system: Secondary | ICD-10-CM | POA: Diagnosis not present

## 2019-03-23 DIAGNOSIS — I2510 Atherosclerotic heart disease of native coronary artery without angina pectoris: Secondary | ICD-10-CM | POA: Insufficient documentation

## 2019-03-23 DIAGNOSIS — I10 Essential (primary) hypertension: Secondary | ICD-10-CM

## 2019-03-23 DIAGNOSIS — Z886 Allergy status to analgesic agent status: Secondary | ICD-10-CM | POA: Insufficient documentation

## 2019-03-23 DIAGNOSIS — I252 Old myocardial infarction: Secondary | ICD-10-CM | POA: Insufficient documentation

## 2019-03-23 DIAGNOSIS — Z794 Long term (current) use of insulin: Secondary | ICD-10-CM | POA: Insufficient documentation

## 2019-03-23 DIAGNOSIS — Z87891 Personal history of nicotine dependence: Secondary | ICD-10-CM | POA: Insufficient documentation

## 2019-03-23 DIAGNOSIS — Z79899 Other long term (current) drug therapy: Secondary | ICD-10-CM | POA: Diagnosis not present

## 2019-03-23 DIAGNOSIS — Z951 Presence of aortocoronary bypass graft: Secondary | ICD-10-CM | POA: Insufficient documentation

## 2019-03-23 DIAGNOSIS — I5032 Chronic diastolic (congestive) heart failure: Secondary | ICD-10-CM | POA: Diagnosis present

## 2019-03-23 DIAGNOSIS — Z833 Family history of diabetes mellitus: Secondary | ICD-10-CM | POA: Diagnosis not present

## 2019-03-23 DIAGNOSIS — I110 Hypertensive heart disease with heart failure: Secondary | ICD-10-CM | POA: Diagnosis not present

## 2019-03-23 DIAGNOSIS — J449 Chronic obstructive pulmonary disease, unspecified: Secondary | ICD-10-CM

## 2019-03-23 LAB — BASIC METABOLIC PANEL
Anion gap: 15 (ref 5–15)
BUN: 67 mg/dL — ABNORMAL HIGH (ref 8–23)
CO2: 27 mmol/L (ref 22–32)
Calcium: 9.5 mg/dL (ref 8.9–10.3)
Chloride: 97 mmol/L — ABNORMAL LOW (ref 98–111)
Creatinine, Ser: 1.47 mg/dL — ABNORMAL HIGH (ref 0.44–1.00)
GFR calc Af Amer: 41 mL/min — ABNORMAL LOW (ref >60)
GFR calc non Af Amer: 36 mL/min — ABNORMAL LOW (ref >60)
Glucose, Bld: 194 mg/dL — ABNORMAL HIGH (ref 70–99)
Potassium: 3.4 mmol/L — ABNORMAL LOW (ref 3.5–5.1)
Sodium: 139 mmol/L (ref 135–145)

## 2019-03-23 MED ORDER — TORSEMIDE 20 MG PO TABS: 60.0000 mg | ORAL_TABLET | Freq: Two times a day (BID) | ORAL | 3 refills | Status: DC

## 2019-03-23 NOTE — Patient Instructions (Signed)
Continue weighing daily and call for an overnight weight gain of > 2 pounds or a weekly weight gain of >5 pounds. 

## 2019-03-23 NOTE — Telephone Encounter (Signed)
Spoke with patient regarding BMP results from 03/23/2019. Potassium is slightly low at 3.4 and GFR is 41. Patient had taken 2 doses of 2.5mg  metolazone along with additional potassium over this past weekend.  Patient currently takes 42meq potassium BID. Advised her to double her potassium today and tomorrow and we will recheck it at her next visit. Patient verbalized understanding.

## 2019-03-30 ENCOUNTER — Telehealth: Payer: Self-pay | Admitting: *Deleted

## 2019-03-30 NOTE — Telephone Encounter (Signed)
Called patient and all she wanted to know if her appt on Monday was virtual or did see need to come in. Instructed her that appt would be VV. Patient with understanding.

## 2019-04-01 ENCOUNTER — Ambulatory Visit: Payer: Medicare HMO | Attending: Internal Medicine

## 2019-04-01 DIAGNOSIS — G4733 Obstructive sleep apnea (adult) (pediatric): Secondary | ICD-10-CM | POA: Diagnosis not present

## 2019-04-04 ENCOUNTER — Other Ambulatory Visit: Payer: Self-pay

## 2019-04-04 ENCOUNTER — Ambulatory Visit: Payer: Medicare HMO | Attending: Anesthesiology | Admitting: Anesthesiology

## 2019-04-04 ENCOUNTER — Encounter: Payer: Self-pay | Admitting: Anesthesiology

## 2019-04-04 DIAGNOSIS — M5136 Other intervertebral disc degeneration, lumbar region: Secondary | ICD-10-CM

## 2019-04-04 DIAGNOSIS — F1190 Opioid use, unspecified, uncomplicated: Secondary | ICD-10-CM

## 2019-04-04 DIAGNOSIS — M4716 Other spondylosis with myelopathy, lumbar region: Secondary | ICD-10-CM

## 2019-04-04 DIAGNOSIS — M48062 Spinal stenosis, lumbar region with neurogenic claudication: Secondary | ICD-10-CM

## 2019-04-04 DIAGNOSIS — M5431 Sciatica, right side: Secondary | ICD-10-CM

## 2019-04-04 DIAGNOSIS — M170 Bilateral primary osteoarthritis of knee: Secondary | ICD-10-CM

## 2019-04-04 DIAGNOSIS — M25512 Pain in left shoulder: Secondary | ICD-10-CM

## 2019-04-04 DIAGNOSIS — M47817 Spondylosis without myelopathy or radiculopathy, lumbosacral region: Secondary | ICD-10-CM

## 2019-04-04 DIAGNOSIS — G894 Chronic pain syndrome: Secondary | ICD-10-CM

## 2019-04-04 DIAGNOSIS — M5432 Sciatica, left side: Secondary | ICD-10-CM

## 2019-04-04 MED ORDER — HYDROCODONE-ACETAMINOPHEN 7.5-325 MG PO TABS: 1.0000 | ORAL_TABLET | Freq: Three times a day (TID) | ORAL | 0 refills | Status: DC

## 2019-04-04 NOTE — Progress Notes (Signed)
Virtual Visit via Telephone Note  I connected with Tina Hayes on 04/04/19 at  1:15 PM EST by telephone and verified that I am speaking with the correct person using two identifiers.  Location: Patient: Home Provider: [facility]   I discussed the limitations, risks, security and privacy concerns of performing an evaluation and management service by telephone and the availability of in person appointments. I also discussed with the patient that there may be a patient responsible charge related to this service. The patient expressed understanding and agreed to proceed.   History of Present Illness: I spoke with Tina Hayes via telephone for her virtual conferencing.  She was unable to do video virtual despite our best efforts.  She maintains that she did well with her previous epidural back in October.  This is lasted approximately 2 months but she is beginning to get some of the wind up pain in her low back with radiation in the posterior buttocks and into the calves on both sides.  She is reporting no new changes in strength or function or problems with bowel or bladder function.  She continues to take her hydrocodone effectively with good relief and denies any diverting or illicit use.  No side effects are reported with that either.  In the past she has been on Xanax to help with muscle spasms but has run out of that.  Have cautioned against the concomitant use of both opioids and benzodiazepines at the same time and that she needs to space those out if she is going to continue with any Xanax.  She understands this.  In regards to her pain syndrome the quality characteristic and distribution of her low back pain and leg pain are stable.  She has not been doing much walking secondary to the Covid crisis.  She has had minimal success at weight loss as well she reports.  Otherwise she is in her usual state of health.    Observations/Objective: Current Outpatient Medications:  .   albuterol (PROVENTIL HFA;VENTOLIN HFA) 108 (90 Base) MCG/ACT inhaler, Inhale 2 puffs into the lungs every 6 (six) hours as needed for wheezing or shortness of breath., Disp: 1 Inhaler, Rfl: 2 .  albuterol (PROVENTIL) (2.5 MG/3ML) 0.083% nebulizer solution, Take 2.5 mg every 4 (four) hours as needed by nebulization for wheezing or shortness of breath., Disp: , Rfl:  .  ALPRAZolam (XANAX) 0.5 MG tablet, Take 0.5 mg by mouth at bedtime., Disp: , Rfl:  .  atorvastatin (LIPITOR) 40 MG tablet, Take 1 tablet (40 mg total) by mouth at bedtime., Disp: 30 tablet, Rfl: 11 .  B-D UF III MINI PEN NEEDLES 31G X 5 MM MISC, USE WITH INSULIN PEN INJECTIONS TWICE DAILY, Disp: , Rfl:  .  budesonide (PULMICORT) 0.5 MG/2ML nebulizer solution, Take 0.5 mg by nebulization daily., Disp: , Rfl:  .  Calcium Carbonate-Vitamin D3 (CALCIUM 600-D) 600-400 MG-UNIT TABS, Take 1 tablet by mouth daily. , Disp: , Rfl:  .  carvedilol (COREG) 3.125 MG tablet, Take 1 tablet (3.125 mg total) by mouth 2 (two) times daily with a meal., Disp: 60 tablet, Rfl: 11 .  cetirizine (ZYRTEC) 10 MG tablet, Take 10 mg by mouth daily., Disp: , Rfl:  .  clopidogrel (PLAVIX) 75 MG tablet, Take 1 tablet (75 mg total) by mouth daily., Disp: 30 tablet, Rfl: 11 .  COMBIVENT RESPIMAT 20-100 MCG/ACT AERS respimat, Inhale 1 puff into the lungs 4 (four) times daily as needed for wheezing. , Disp: ,  Rfl:  .  famotidine (PEPCID) 20 MG tablet, Take 20 mg by mouth at bedtime., Disp: , Rfl:  .  [START ON 05/06/2019] HYDROcodone-acetaminophen (NORCO) 7.5-325 MG tablet, Take 1 tablet by mouth 3 (three) times daily., Disp: 90 tablet, Rfl: 0 .  insulin aspart (NOVOLOG) 100 UNIT/ML injection, Inject 10 Units into the skin 3 (three) times daily. , Disp: , Rfl:  .  insulin glargine (LANTUS) 100 UNIT/ML injection, Inject 30-40 Units into the skin See admin instructions. Inject 30 units in the morning at 40 units at bedtime., Disp: , Rfl:  .  isosorbide mononitrate (IMDUR) 30 MG  24 hr tablet, Take 1 tablet (30 mg total) by mouth 2 (two) times daily., Disp: 60 tablet, Rfl: 11 .  levETIRAcetam (KEPPRA) 750 MG tablet, Take 750 mg by mouth 2 (two) times daily., Disp: , Rfl:  .  losartan (COZAAR) 100 MG tablet, Take 1 tablet (100 mg total) by mouth daily., Disp: 30 tablet, Rfl: 11 .  meloxicam (MOBIC) 7.5 MG tablet, Take 7.5 mg by mouth daily as needed for pain. , Disp: , Rfl:  .  metolazone (ZAROXOLYN) 2.5 MG tablet, Take 1 tablet (2.5 mg total) by mouth daily., Disp: 3 tablet, Rfl: 0 .  mometasone-formoterol (DULERA) 200-5 MCG/ACT AERO, Inhale 2 puffs into the lungs 2 (two) times daily., Disp: 13 g, Rfl: 0 .  montelukast (SINGULAIR) 10 MG tablet, Take 10 mg by mouth daily. , Disp: , Rfl:  .  nitroGLYCERIN (NITROSTAT) 0.4 MG SL tablet, DISSOLVE ONE TABLET UNDER THE TONGUE EVERY 5 MINUTES AS NEEDED FOR CHEST PAIN.  DO NOT EXCEED A TOTAL OF 3 DOSES IN 15 MINUTES (Patient taking differently: Place 0.4 mg under the tongue every 5 (five) minutes as needed for chest pain. ), Disp: 25 tablet, Rfl: 0 .  potassium chloride SA (KLOR-CON) 20 MEQ tablet, Take 2 tablets (40 mEq total) by mouth 2 (two) times daily. Take extra 2 tablets (40 meq) if you take metolazone, Disp: 140 tablet, Rfl: 5 .  sertraline (ZOLOFT) 50 MG tablet, Take 50 mg by mouth daily., Disp: , Rfl:  .  tiotropium (SPIRIVA) 18 MCG inhalation capsule, Place 18 mcg into inhaler and inhale daily., Disp: , Rfl:  .  torsemide (DEMADEX) 20 MG tablet, Take 3 tablets (60 mg total) by mouth 2 (two) times daily., Disp: 540 tablet, Rfl: 3 .  traZODone (DESYREL) 50 MG tablet, Take 50 mg by mouth at bedtime., Disp: , Rfl:    Assessment and Plan: 1. Bilateral sciatica   2. DDD (degenerative disc disease), lumbar   3. Spinal stenosis of lumbar region with neurogenic claudication   4. Lumbar spondylosis with myelopathy   5. Chronic, continuous use of opioids   6. Chronic pain syndrome   7. Facet arthritis of lumbosacral region   8.  Primary osteoarthritis of both knees   9. Pain in joint of left shoulder   Based on our discussion today and upon review of the [facility] practitioner database information I am going to refill her Vicodin for the next 2 months.  She has an outstanding prescription for December already.  I schedule her for return to clinic approximately 1 month for possible repeat epidural.  Want her to continue with stretching strengthening exercises as reviewed.  She is also to continue follow-up with her primary care physician.  Once again I have cautioned her against concomitant use of both the benzodiazepines and the opioids.  I want her to continue with efforts at  weight loss as well.  She is instructed to contact us should she have any other problems with her pain management syndrome.  Follow Up Instructions:    I discussed the assessment and treatment plan with the patient. The patient was provided an opportunity to ask questions and all were answered. The patient agreed with the plan and demonstrated an understanding of the instructions.   The patient was advised to call back or seek an in-person evaluation if the symptoms worsen or if the condition fails to improve as anticipated.  I provided 30 minutes of non-face-to-face time during this encounter.   Molli Barrows, MD

## 2019-04-05 ENCOUNTER — Telehealth: Payer: Self-pay | Admitting: Anesthesiology

## 2019-04-05 NOTE — Telephone Encounter (Signed)
Patient lvmail stating she needs Dr. Andree Elk to write her Zanax as her pcp states her pcp will not write narcotics. Please call patient and advise if Dr. Andree Elk will do this.

## 2019-04-05 NOTE — Telephone Encounter (Signed)
Attempted to call patient, message left. 

## 2019-04-05 NOTE — Telephone Encounter (Signed)
Patient advised Dr. Andree Elk will not prescribe Xanax due to contraindication of takine a benzo with an opoiod.

## 2019-05-05 ENCOUNTER — Telehealth: Payer: Self-pay

## 2019-05-05 NOTE — Telephone Encounter (Signed)
I am trying to do a prior authorization for this patient, with a new vendor and they are asking what the patients Oswestry low back pain disability index score is. Any idea what this means? I cant proceed without answering the question. Please advise.

## 2019-05-05 NOTE — Telephone Encounter (Signed)
I will forward this question to Dr. Andree Elk when I see him today.

## 2019-05-12 ENCOUNTER — Ambulatory Visit (HOSPITAL_BASED_OUTPATIENT_CLINIC_OR_DEPARTMENT_OTHER): Payer: Medicare HMO | Admitting: Anesthesiology

## 2019-05-12 ENCOUNTER — Encounter: Payer: Self-pay | Admitting: Anesthesiology

## 2019-05-12 ENCOUNTER — Other Ambulatory Visit: Payer: Self-pay | Admitting: Anesthesiology

## 2019-05-12 ENCOUNTER — Other Ambulatory Visit: Payer: Self-pay

## 2019-05-12 ENCOUNTER — Ambulatory Visit
Admission: RE | Admit: 2019-05-12 | Discharge: 2019-05-12 | Disposition: A | Discharge status: Home / Self Care | Payer: Medicare HMO | Source: Ambulatory Visit | Attending: Anesthesiology | Admitting: Anesthesiology

## 2019-05-12 VITALS — BP 110/62 | HR 64 | Temp 97.5°F | Resp 18 | Ht 71.0 in | Wt 302.0 lb

## 2019-05-12 DIAGNOSIS — M47817 Spondylosis without myelopathy or radiculopathy, lumbosacral region: Secondary | ICD-10-CM | POA: Diagnosis present

## 2019-05-12 DIAGNOSIS — M5431 Sciatica, right side: Secondary | ICD-10-CM

## 2019-05-12 DIAGNOSIS — G894 Chronic pain syndrome: Secondary | ICD-10-CM | POA: Insufficient documentation

## 2019-05-12 DIAGNOSIS — F1190 Opioid use, unspecified, uncomplicated: Secondary | ICD-10-CM

## 2019-05-12 DIAGNOSIS — M5432 Sciatica, left side: Secondary | ICD-10-CM | POA: Diagnosis present

## 2019-05-12 DIAGNOSIS — M4716 Other spondylosis with myelopathy, lumbar region: Secondary | ICD-10-CM | POA: Diagnosis present

## 2019-05-12 DIAGNOSIS — M48062 Spinal stenosis, lumbar region with neurogenic claudication: Secondary | ICD-10-CM

## 2019-05-12 DIAGNOSIS — M5136 Other intervertebral disc degeneration, lumbar region: Secondary | ICD-10-CM | POA: Insufficient documentation

## 2019-05-12 DIAGNOSIS — R52 Pain, unspecified: Secondary | ICD-10-CM | POA: Diagnosis present

## 2019-05-12 MED ORDER — SODIUM CHLORIDE 0.9% FLUSH
10.0000 mL | Freq: Once | INTRAVENOUS | Status: AC
  Administered 2019-05-12: 14:00:00 10 mL

## 2019-05-12 MED ORDER — SODIUM CHLORIDE (PF) 0.9 % IJ SOLN
INTRAMUSCULAR | Status: AC
  Filled 2019-05-12: qty 10

## 2019-05-12 MED ORDER — ROPIVACAINE HCL 2 MG/ML IJ SOLN
10.0000 mL | Freq: Once | INTRAMUSCULAR | Status: AC
  Administered 2019-05-12: 10 mL via EPIDURAL

## 2019-05-12 MED ORDER — IOPAMIDOL (ISOVUE-M 200) INJECTION 41%
20.0000 mL | Freq: Once | INTRAMUSCULAR | Status: DC | PRN
  Administered 2019-05-12: 14:00:00 20 mL

## 2019-05-12 MED ORDER — TRIAMCINOLONE ACETONIDE 40 MG/ML IJ SUSP
40.0000 mg | Freq: Once | INTRAMUSCULAR | Status: AC
  Administered 2019-05-12: 14:00:00 40 mg

## 2019-05-12 MED ORDER — LIDOCAINE HCL (PF) 1 % IJ SOLN
INTRAMUSCULAR | Status: AC
  Filled 2019-05-12: qty 5

## 2019-05-12 MED ORDER — LIDOCAINE HCL (PF) 1 % IJ SOLN
5.0000 mL | Freq: Once | INTRAMUSCULAR | Status: AC
  Administered 2019-05-12: 5 mL via SUBCUTANEOUS

## 2019-05-12 MED ORDER — HYDROCODONE-ACETAMINOPHEN 7.5-325 MG PO TABS: 1.0000 | ORAL_TABLET | Freq: Three times a day (TID) | ORAL | 0 refills | Status: AC

## 2019-05-12 MED ORDER — TRIAMCINOLONE ACETONIDE 40 MG/ML IJ SUSP
INTRAMUSCULAR | Status: AC
  Filled 2019-05-12: qty 1

## 2019-05-12 MED ORDER — HYDROCODONE-ACETAMINOPHEN 7.5-325 MG PO TABS: 1.0000 | ORAL_TABLET | Freq: Four times a day (QID) | ORAL | 0 refills | Status: DC | PRN

## 2019-05-12 MED ORDER — LACTATED RINGERS IV SOLN
1000.0000 mL | INTRAVENOUS | Status: DC
  Administered 2019-05-12: 1000 mL via INTRAVENOUS

## 2019-05-12 MED ORDER — IOHEXOL 180 MG/ML  SOLN
INTRAMUSCULAR | Status: AC
  Filled 2019-05-12: qty 20

## 2019-05-12 MED ORDER — ROPIVACAINE HCL 2 MG/ML IJ SOLN
INTRAMUSCULAR | Status: AC
  Filled 2019-05-12: qty 10

## 2019-05-12 MED ORDER — MIDAZOLAM HCL 5 MG/5ML IJ SOLN
INTRAMUSCULAR | Status: AC
  Filled 2019-05-12: qty 5

## 2019-05-12 MED ORDER — MIDAZOLAM HCL 5 MG/5ML IJ SOLN
5.0000 mg | Freq: Once | INTRAMUSCULAR | Status: AC
  Administered 2019-05-12: 14:00:00 2 mg via INTRAVENOUS

## 2019-05-12 NOTE — Progress Notes (Signed)
Subjective:  Patient ID: Tina Hayes, female    DOB: 11-10-1945  Age: 74 y.o. MRN: [id number]  CC: Back Pain (lower)   Procedure: L5-S1 epidural steroid under fluoroscopic guidance with moderate sedation  HPI Tina Hayes presents for reevaluation.  She was last seen few months ago and has had 2 epidurals last year.  Each time she has an epidural she gets good relief of her low back pain and bilateral lower extremity pain with sciatica-like symptoms for about 2 to 2-1/2 months.  She rates this at approximately 75 to 80% relief.  She maintains that the injections enable her to try and be as active as possible to stand for longer periods of time and sleep better at night.  She denies any problems with bowel or bladder dysfunction or any problems with lower extremity weakness.  The quality characteristic and distribution of her pain have been stable in nature without significant changes other than intensity.  She has tried efforts at weight loss and physical therapy with limited success and relies on epidurals periodically for pain relief.  These seem to work very well for her and keep her active.  Keep her without severe pain.  She is also taking her hydrocodone 3 times a day without side effects and getting good relief from this based on her narcotic assessment sheet.  Outpatient Medications Prior to Visit  Medication Sig Dispense Refill  . albuterol (PROVENTIL HFA;VENTOLIN HFA) 108 (90 Base) MCG/ACT inhaler Inhale 2 puffs into the lungs every 6 (six) hours as needed for wheezing or shortness of breath. 1 Inhaler 2  . albuterol (PROVENTIL) (2.5 MG/3ML) 0.083% nebulizer solution Take 2.5 mg every 4 (four) hours as needed by nebulization for wheezing or shortness of breath.    . ALPRAZolam (XANAX) 0.5 MG tablet Take 0.5 mg by mouth at bedtime.    [name] atorvastatin (LIPITOR) 40 MG tablet Take 1 tablet (40 mg total) by mouth at bedtime. 30 tablet 11  . B-D UF III MINI PEN NEEDLES 31G X 5 MM MISC USE  WITH INSULIN PEN INJECTIONS TWICE DAILY    . budesonide (PULMICORT) 0.5 MG/2ML nebulizer solution Take 0.5 mg by nebulization daily.    . Calcium Carbonate-Vitamin D3 (CALCIUM 600-D) 600-400 MG-UNIT TABS Take 1 tablet by mouth daily.     . carvedilol (COREG) 3.125 MG tablet Take 1 tablet (3.125 mg total) by mouth 2 (two) times daily with a meal. 60 tablet 11  . cetirizine (ZYRTEC) 10 MG tablet Take 10 mg by mouth daily.    . clopidogrel (PLAVIX) 75 MG tablet Take 1 tablet (75 mg total) by mouth daily. 30 tablet 11  . COMBIVENT RESPIMAT 20-100 MCG/ACT AERS respimat Inhale 1 puff into the lungs 4 (four) times daily as needed for wheezing.     . famotidine (PEPCID) 20 MG tablet Take 20 mg by mouth at bedtime.    . insulin aspart (NOVOLOG) 100 UNIT/ML injection Inject 10 Units into the skin 3 (three) times daily.     . insulin glargine (LANTUS) 100 UNIT/ML injection Inject 30-40 Units into the skin See admin instructions. Inject 30 units in the morning at 40 units at bedtime.    . isosorbide mononitrate (IMDUR) 30 MG 24 hr tablet Take 1 tablet (30 mg total) by mouth 2 (two) times daily. 60 tablet 11  . levETIRAcetam (KEPPRA) 750 MG tablet Take 750 mg by mouth 2 (two) times daily.    [name] losartan (COZAAR) 100 MG tablet Take 1  tablet (100 mg total) by mouth daily. 30 tablet 11  . meloxicam (MOBIC) 7.5 MG tablet Take 7.5 mg by mouth daily as needed for pain.     . metolazone (ZAROXOLYN) 2.5 MG tablet Take 1 tablet (2.5 mg total) by mouth daily. 3 tablet 0  . mometasone-formoterol (DULERA) 200-5 MCG/ACT AERO Inhale 2 puffs into the lungs 2 (two) times daily. 13 g 0  . montelukast (SINGULAIR) 10 MG tablet Take 10 mg by mouth daily.     . nitroGLYCERIN (NITROSTAT) 0.4 MG SL tablet DISSOLVE ONE TABLET UNDER THE TONGUE EVERY 5 MINUTES AS NEEDED FOR CHEST PAIN.  DO NOT EXCEED A TOTAL OF 3 DOSES IN 15 MINUTES (Patient taking differently: Place 0.4 mg under the tongue every 5 (five) minutes as needed for chest pain.  ) 25 tablet 0  . potassium chloride SA (KLOR-CON) 20 MEQ tablet Take 2 tablets (40 mEq total) by mouth 2 (two) times daily. Take extra 2 tablets (40 meq) if you take metolazone 140 tablet 5  . sertraline (ZOLOFT) 50 MG tablet Take 50 mg by mouth daily.    [name] tiotropium (SPIRIVA) 18 MCG inhalation capsule Place 18 mcg into inhaler and inhale daily.    [name] torsemide (DEMADEX) 20 MG tablet Take 3 tablets (60 mg total) by mouth 2 (two) times daily. 540 tablet 3  . traZODone (DESYREL) 50 MG tablet Take 50 mg by mouth at bedtime.    [name] HYDROcodone-acetaminophen (NORCO) 7.5-325 MG tablet Take 1 tablet by mouth 3 (three) times daily. 90 tablet 0   No facility-administered medications prior to visit.    Review of Systems CNS: No confusion or sedation Cardiac: No angina or palpitations GI: No abdominal pain or constipation Constitutional: No nausea vomiting fevers or chills  Objective:  BP 137/76   Pulse 64   Temp (!) 97.5 F (36.4 C)   Resp 18   Ht 5\' 11"  (1.803 m)   Wt (!) 302 lb (137 kg)   SpO2 98%   BMI 42.12 kg/m    BP Readings from Last 3 Encounters:  05/12/19 137/76  03/23/19 (!) 135/95  02/16/19 132/70     Wt Readings from Last 3 Encounters:  05/12/19 (!) 302 lb (137 kg)  03/23/19 (!) 302 lb 6.4 oz (137.2 kg)  02/16/19 (!) 307 lb 9.6 oz (139.5 kg)     Physical Exam Pt is alert and oriented PERRL EOMI HEART IS RRR no murmur or rub LCTA no wheezing or rales MUSCULOSKELETAL reveals some paraspinous muscle tenderness in the lumbar region but no overt trigger points.  She has difficulty with examination but appears to have a straight leg raise positive on the right side negative on the left with baseline muscle tone and bulk.  She uses a scooter and has difficulty with ambulation.  Labs  Lab Results  Component Value Date   HGBA1C 6.5 (H) 01/18/2019   HGBA1C 7.5 (H) 05/27/2018   HGBA1C 7.7 (H) 08/16/2017   Lab Results  Component Value Date   LDLCALC 58 01/30/2017    CREATININE 1.47 (H) 03/23/2019    -------------------------------------------------------------------------------------------------------------------- Lab Results  Component Value Date   WBC 7.9 02/02/2019   HGB 10.3 (L) 02/02/2019   HCT 33.0 (L) 02/02/2019   PLT 237 02/02/2019   GLUCOSE 194 (H) 03/23/2019   CHOL 120 01/30/2017   TRIG 73 01/30/2017   HDL 47 01/30/2017   LDLCALC 58 01/30/2017   ALT 12 02/01/2019   AST 25 02/01/2019   NA 139  03/23/2019   K 3.4 (L) 03/23/2019   CL 97 (L) 03/23/2019   CREATININE 1.47 (H) 03/23/2019   BUN 67 (H) 03/23/2019   CO2 27 03/23/2019   INR 0.98 05/25/2018   HGBA1C 6.5 (H) 01/18/2019    --------------------------------------------------------------------------------------------------------------------- DG PAIN CLINIC C-ARM 1-60 MIN NO REPORT  Result Date: 05/12/2019 Fluoro was used, but no Radiologist interpretation will be provided. Please refer to "NOTES" tab for provider progress note.    Assessment & Plan:   Rykia was seen today for back pain.  Diagnoses and all orders for this visit:  DDD (degenerative disc disease), lumbar -     MR LUMBAR SPINE WO CONTRAST; Future  Bilateral sciatica -     Lumbar Epidural Injection -     MR LUMBAR SPINE WO CONTRAST; Future  Spinal stenosis of lumbar region with neurogenic claudication -     Lumbar Epidural Injection -     MR LUMBAR SPINE WO CONTRAST; Future  Lumbar spondylosis with myelopathy -     Lumbar Epidural Injection -     MR LUMBAR SPINE WO CONTRAST; Future  Chronic, continuous use of opioids  Chronic pain syndrome  Facet arthritis of lumbosacral region  Other orders -     triamcinolone acetonide (KENALOG-40) injection 40 mg -     sodium chloride flush (NS) 0.9 % injection 10 mL -     ropivacaine (PF) 2 mg/mL (0.2%) (NAROPIN) injection 10 mL -     midazolam (VERSED) 5 MG/5ML injection 5 mg -     lidocaine (PF) (XYLOCAINE) 1 % injection 5 mL -     lactated ringers  infusion 1,000 mL -     iopamidol (ISOVUE-M) 41 % intrathecal injection 20 mL -     HYDROcodone-acetaminophen (NORCO) 7.5-325 MG tablet; Take 1 tablet by mouth 3 (three) times daily. -     HYDROcodone-acetaminophen (NORCO) 7.5-325 MG tablet; Take 1 tablet by mouth every 6 (six) hours as needed for moderate pain or severe pain.        ----------------------------------------------------------------------------------------------------------------------  Problem List Items Addressed This Visit    None    Visit Diagnoses    DDD (degenerative disc disease), lumbar    -  Primary   Relevant Medications   triamcinolone acetonide (KENALOG-40) injection 40 mg (Completed)   HYDROcodone-acetaminophen (NORCO) 7.5-325 MG tablet (Start on 06/05/2019)   HYDROcodone-acetaminophen ([place]) 7.5-325 MG tablet (Start on 07/04/2019)   Other Relevant Orders   MR LUMBAR SPINE WO CONTRAST   Bilateral sciatica       Relevant Medications   midazolam (VERSED) 5 MG/5ML injection 5 mg (Completed)   Other Relevant Orders   MR LUMBAR SPINE WO CONTRAST   Spinal stenosis of lumbar region with neurogenic claudication       Relevant Medications   triamcinolone acetonide (KENALOG-40) injection 40 mg (Completed)   ropivacaine (PF) 2 mg/mL (0.2%) (NAROPIN) injection 10 mL (Completed)   lidocaine (PF) (XYLOCAINE) 1 % injection 5 mL (Completed)   HYDROcodone-acetaminophen (NORCO) 7.5-325 MG tablet (Start on 06/05/2019)   HYDROcodone-acetaminophen (NORCO) 7.5-325 MG tablet (Start on 07/04/2019)   Other Relevant Orders   MR LUMBAR SPINE WO CONTRAST   Lumbar spondylosis with myelopathy       Relevant Medications   triamcinolone acetonide (KENALOG-40) injection 40 mg (Completed)   HYDROcodone-acetaminophen (NORCO) 7.5-325 MG tablet (Start on 06/05/2019)   HYDROcodone-acetaminophen (NORCO) 7.5-325 MG tablet (Start on 07/04/2019)   Other Relevant Orders   MR LUMBAR SPINE WO CONTRAST   Chronic,  continuous use of opioids        Chronic pain syndrome       Relevant Medications   triamcinolone acetonide (KENALOG-40) injection 40 mg (Completed)   ropivacaine (PF) 2 mg/mL (0.2%) (NAROPIN) injection 10 mL (Completed)   lidocaine (PF) (XYLOCAINE) 1 % injection 5 mL (Completed)   HYDROcodone-acetaminophen (NORCO) 7.5-325 MG tablet (Start on 06/05/2019)   HYDROcodone-acetaminophen (NORCO) 7.5-325 MG tablet (Start on 07/04/2019)   Facet arthritis of lumbosacral region       Relevant Medications   triamcinolone acetonide (KENALOG-40) injection 40 mg (Completed)   HYDROcodone-acetaminophen (NORCO) 7.5-325 MG tablet (Start on 06/05/2019)   HYDROcodone-acetaminophen ([place]) 7.5-325 MG tablet (Start on 07/04/2019)        ----------------------------------------------------------------------------------------------------------------------  1. Bilateral sciatica Secondary to the chronicity of her low back pain and despite previous efforts to get her to have an MRI we are making a repeat request for an MRI secondary to the chronicity of her low back pain and the persistent lower extremity symptoms that she is having.  I talked her once again about continuing ambulation and physical therapy despite the Covid crisis and efforts at weight loss. - Lumbar Epidural Injection - MR LUMBAR SPINE WO CONTRAST; Future  2. Spinal stenosis of lumbar region with neurogenic claudication As above - Lumbar Epidural Injection - MR LUMBAR SPINE WO CONTRAST; Future  3. Lumbar spondylosis with myelopathy As above - Lumbar Epidural Injection - MR LUMBAR SPINE WO CONTRAST; Future  4. DDD (degenerative disc disease), lumbar As above.  We will proceed with a epidural today and we gone over the risks and benefits of the procedure with her in full detail and all questions are answered.  She is not on any blood thinners at this time.  We will schedule her for to return to clinic in 2 months for reevaluation. - MR LUMBAR SPINE WO CONTRAST; Future  5.  Chronic, continuous use of opioids I have reviewed the [facility] practitioner database information and it is appropriate.  We will give her refills for the next 2 months today.  6. Chronic pain syndrome As above  7. Facet arthritis of lumbosacral region As above and continue follow-up with her primary care physicians for baseline medical problems.    ----------------------------------------------------------------------------------------------------------------------  I am having Velora Heckler. Brittian start on HYDROcodone-acetaminophen. I am also having her maintain her tiotropium, traZODone, albuterol, insulin aspart, insulin glargine, Calcium Carbonate-Vitamin D3, albuterol, montelukast, cetirizine, nitroGLYCERIN, meloxicam, levETIRAcetam, sertraline, Combivent Respimat, B-D UF III MINI PEN NEEDLES, atorvastatin, carvedilol, isosorbide mononitrate, losartan, clopidogrel, famotidine, mometasone-formoterol, potassium chloride SA, metolazone, ALPRAZolam, budesonide, torsemide, and HYDROcodone-acetaminophen. We administered triamcinolone acetonide, sodium chloride flush, ropivacaine (PF) 2 mg/mL (0.2%), midazolam, lidocaine (PF), and lactated ringers.   Meds ordered this encounter  Medications  . triamcinolone acetonide (KENALOG-40) injection 40 mg  . sodium chloride flush (NS) 0.9 % injection 10 mL  . ropivacaine (PF) 2 mg/mL (0.2%) (NAROPIN) injection 10 mL  . midazolam (VERSED) 5 MG/5ML injection 5 mg  . lidocaine (PF) (XYLOCAINE) 1 % injection 5 mL  . lactated ringers infusion 1,000 mL  . iopamidol (ISOVUE-M) 41 % intrathecal injection 20 mL  . HYDROcodone-acetaminophen (NORCO) 7.5-325 MG tablet    Sig: Take 1 tablet by mouth 3 (three) times daily.    Dispense:  90 tablet    Refill:  0  . HYDROcodone-acetaminophen (NORCO) 7.5-325 MG tablet    Sig: Take 1 tablet by mouth every 6 (six) hours as needed for moderate pain or severe pain.  Dispense:  90 tablet    Refill:  0   Patient's  Medications  New Prescriptions   HYDROCODONE-ACETAMINOPHEN (NORCO) 7.5-325 MG TABLET    Take 1 tablet by mouth every 6 (six) hours as needed for moderate pain or severe pain.  Previous Medications   ALBUTEROL (PROVENTIL HFA;VENTOLIN HFA) 108 (90 BASE) MCG/ACT INHALER    Inhale 2 puffs into the lungs every 6 (six) hours as needed for wheezing or shortness of breath.   ALBUTEROL (PROVENTIL) (2.5 MG/3ML) 0.083% NEBULIZER SOLUTION    Take 2.5 mg every 4 (four) hours as needed by nebulization for wheezing or shortness of breath.   ALPRAZOLAM (XANAX) 0.5 MG TABLET    Take 0.5 mg by mouth at bedtime.   ATORVASTATIN (LIPITOR) 40 MG TABLET    Take 1 tablet (40 mg total) by mouth at bedtime.   B-D UF III MINI PEN NEEDLES 31G X 5 MM MISC    USE WITH INSULIN PEN INJECTIONS TWICE DAILY   BUDESONIDE (PULMICORT) 0.5 MG/2ML NEBULIZER SOLUTION    Take 0.5 mg by nebulization daily.   CALCIUM CARBONATE-VITAMIN D3 (CALCIUM 600-D) 600-400 MG-UNIT TABS    Take 1 tablet by mouth daily.    CARVEDILOL (COREG) 3.125 MG TABLET    Take 1 tablet (3.125 mg total) by mouth 2 (two) times daily with a meal.   CETIRIZINE (ZYRTEC) 10 MG TABLET    Take 10 mg by mouth daily.   CLOPIDOGREL (PLAVIX) 75 MG TABLET    Take 1 tablet (75 mg total) by mouth daily.   COMBIVENT RESPIMAT 20-100 MCG/ACT AERS RESPIMAT    Inhale 1 puff into the lungs 4 (four) times daily as needed for wheezing.    FAMOTIDINE (PEPCID) 20 MG TABLET    Take 20 mg by mouth at bedtime.   INSULIN ASPART (NOVOLOG) 100 UNIT/ML INJECTION    Inject 10 Units into the skin 3 (three) times daily.    INSULIN GLARGINE (LANTUS) 100 UNIT/ML INJECTION    Inject 30-40 Units into the skin See admin instructions. Inject 30 units in the morning at 40 units at bedtime.   ISOSORBIDE MONONITRATE (IMDUR) 30 MG 24 HR TABLET    Take 1 tablet (30 mg total) by mouth 2 (two) times daily.   LEVETIRACETAM (KEPPRA) 750 MG TABLET    Take 750 mg by mouth 2 (two) times daily.   LOSARTAN (COZAAR) 100  MG TABLET    Take 1 tablet (100 mg total) by mouth daily.   MELOXICAM (MOBIC) 7.5 MG TABLET    Take 7.5 mg by mouth daily as needed for pain.    METOLAZONE (ZAROXOLYN) 2.5 MG TABLET    Take 1 tablet (2.5 mg total) by mouth daily.   MOMETASONE-FORMOTEROL (DULERA) 200-5 MCG/ACT AERO    Inhale 2 puffs into the lungs 2 (two) times daily.   MONTELUKAST (SINGULAIR) 10 MG TABLET    Take 10 mg by mouth daily.    NITROGLYCERIN (NITROSTAT) 0.4 MG SL TABLET    DISSOLVE ONE TABLET UNDER THE TONGUE EVERY 5 MINUTES AS NEEDED FOR CHEST PAIN.  DO NOT EXCEED A TOTAL OF 3 DOSES IN 15 MINUTES   POTASSIUM CHLORIDE SA (KLOR-CON) 20 MEQ TABLET    Take 2 tablets (40 mEq total) by mouth 2 (two) times daily. Take extra 2 tablets (40 meq) if you take metolazone   SERTRALINE (ZOLOFT) 50 MG TABLET    Take 50 mg by mouth daily.   TIOTROPIUM (SPIRIVA) 18 MCG INHALATION CAPSULE  Place 18 mcg into inhaler and inhale daily.   TORSEMIDE (DEMADEX) 20 MG TABLET    Take 3 tablets (60 mg total) by mouth 2 (two) times daily.   TRAZODONE (DESYREL) 50 MG TABLET    Take 50 mg by mouth at bedtime.  Modified Medications   Modified Medication Previous Medication   HYDROCODONE-ACETAMINOPHEN (NORCO) 7.5-325 MG TABLET HYDROcodone-acetaminophen (NORCO) 7.5-325 MG tablet      Take 1 tablet by mouth 3 (three) times daily.    Take 1 tablet by mouth 3 (three) times daily.  Discontinued Medications   No medications on file   ----------------------------------------------------------------------------------------------------------------------  Follow-up: Return in about 2 months (around 07/10/2019) for evaluation, med refill.   Procedure: L5-S1 LESI with fluoroscopic guidance and with moderate sedation  NOTE: The risks, benefits, and expectations of the procedure have been discussed and explained to the patient who was understanding and in agreement with suggested treatment plan. No guarantees were made.  DESCRIPTION OF PROCEDURE: Lumbar  epidural steroid injection with 2 mg IV Versed, EKG, blood pressure, pulse, and pulse oximetry monitoring. The procedure was performed with the patient in the prone position under fluoroscopic guidance.  Sterile prep x3 was initiated and I then injected subcutaneous lidocaine to the overlying L5-S1 site after its fluoroscopic identifictation.  Using strict aseptic technique, I then advanced an 18-gauge Tuohy epidural needle in the midline using interlaminar approach via loss-of-resistance to saline technique. There was negative aspiration for heme or  CSF.  I then confirmed position with both AP and Lateral fluoroscan.  2 cc of contrast dye were injected and a  total of 5 mL of Preservative-Free normal saline mixed with 40 mg of Kenalog and 1cc Ropicaine 0.2 percent were injected incrementally via the  epidurally placed needle. The needle was removed. The patient tolerated the injection well and was convalesced and discharged to home in stable condition. Should the patient have any post procedure difficulty they have been instructed on how to contact us for assistance.    Molli Barrows, MD

## 2019-05-12 NOTE — Progress Notes (Signed)
Nursing Pain Medication Assessment:  Safety precautions to be maintained throughout the outpatient stay will include: orient to surroundings, keep bed in low position, maintain call bell within reach at all times, provide assistance with transfer out of bed and ambulation.  Medication Inspection Compliance: Ms. Kuba did not comply with our request to bring her pills to be counted. She was reminded that bringing the medication bottles, even when empty, is a requirement.  Medication: None brought in. Pill/Patch Count: None available to be counted. Bottle Appearance: No container available. Did not bring bottle(s) to appointment. Filled Date: N/A Last Medication intake:  Today

## 2019-05-12 NOTE — Patient Instructions (Signed)

## 2019-05-18 NOTE — Progress Notes (Signed)
Patient ID: Tina Hayes, female    DOB: 03-Mar-1946, 74 y.o.   MRN: [id number]  HPI  Ms Luberda is a 74 y/o female with a history of seizures, HTN, cardiac stents, DM, depression, CAD, COPD, chronic back pain, anxiety, obesity, remote tobacco use and chronic heart failure.   Echo report from 02/02/2019 reviewed and showed an EF of 60-65% along with moderate MR/TR and moderately elevated PA pressure. Echo done 06/17/16 and showed an EF of 60-65% along with mild MR and moderate TR. Pulmonary systolic pressure was mildly to moderately elevated. Stress test done 06/18/16.  Admitted 02/01/2019 due to COPD exacerbation. Pulmonology consult obtained. IV solumedrol, antibiotics and nebulizer given. Discharged after 2 days. Admitted 01/17/2019 due to COPD exacerbation. Given IV steroids, nebulizer and antibiotics. Discharged after 2 days.   She presents today for her follow-up visit with a chief complaint of minimal shortness of breath upon moderate exertion. She describes this as chronic in nature having been present for several years. She has associated fatigue, intermittent chest tightness, wheezing, light-headedness and chronic pain along with this. She denies any difficulty sleeping, abdominal distention, palpitations, pedal edema, chest pain or weight gain. Says that her depression is improving.  Currently scheduled for a sleep study on 05/24/19, she thinks through her pulmonology office.   Past Medical History:  Diagnosis Date  . (HFpEF) heart failure with preserved ejection fraction ([place])    a. 05/2016 Echo: EF 60-65%, mild to mod LVH, Gr1 DD, mild MR, mildly dil LA, mod TR, mildly to mod increased PASP.  [name] Acute diastolic heart failure ([place]) 01/27/2017  . Anxiety   . Chest pain 06/16/2016  . CHF (congestive heart failure) ([place])   . Chronic back pain   . Chronic diastolic congestive heart failure ([place]) 02/13/2017  . COPD (chronic obstructive pulmonary disease) ([place])   . Coronary artery disease    a.  s/p remote PCI x 5;  b. 2006 s/p CABG x 3 ([place]); b. 05/2016 MV: attenuation corrected images w/o ischemia or wma-->Med rx.  . Coronary artery disease of native artery of native heart with stable angina pectoris ([place]) 06/17/2016  . Depression   . Diabetes mellitus without complication ([place])   . Essential hypertension 06/30/2016  . Heart attack ([place])    Total of 3 per pt.  . Hypertension   . Hypertensive urgency 06/03/2015  . Seizure ([place])   . Seizures ([place])    Past Surgical History:  Procedure Laterality Date  . ABDOMINAL HYSTERECTOMY    . ANKLE SURGERY Right   . CATARACT EXTRACTION W/ INTRAOCULAR LENS  IMPLANT, BILATERAL    . COLONOSCOPY WITH PROPOFOL N/A 05/04/2017   Procedure: COLONOSCOPY WITH PROPOFOL;  Surgeon: [name], MD;  Location: [facility] ENDOSCOPY;  Service: Endoscopy;  Laterality: N/A;  . CORONARY ANGIOPLASTY    . CORONARY ARTERY BYPASS GRAFT     TRIPLE BYPASS  . ESOPHAGOGASTRODUODENOSCOPY (EGD) WITH PROPOFOL N/A 05/04/2017   Procedure: ESOPHAGOGASTRODUODENOSCOPY (EGD) WITH PROPOFOL;  Surgeon: [name], MD;  Location: [facility] ENDOSCOPY;  Service: Endoscopy;  Laterality: N/A;  . EXCISION MASS LOWER EXTREMETIES Right 03/02/2018   Procedure: EXCISION SOFT TISSUE MASS FROM MEDIAL ASPECT OF RIGHT ANKLE;  Surgeon: [name], MD;  Location: [facility] ORS;  Service: Orthopedics;  Laterality: Right;  . FRACTURE SURGERY    . KNEE SURGERY Right   . MOUTH SURGERY Left 07/14/2017   Family History  Problem Relation Age of Onset  . Diabetes  Other   . Hypertension Other   . Diabetes Mother   . Heart failure Mother   . Heart disease Mother   . Heart attack Mother   . Stroke Mother   . Depression Mother   . Hypertension Mother   . Cancer Sister        brain  . Hypertension Sister   . Diabetes Brother   . Hypertension Brother   . Heart failure Sister   . Heart attack Sister   . SIDS Sister    Social History   Tobacco Use  . Smoking  status: Former Smoker    Packs/day: 0.25    Years: 20.00    Pack years: 5.00    Types: Cigarettes    Quit date: 07/01/2006    Years since quitting: 12.8  . Smokeless tobacco: Never Used  Substance Use Topics  . Alcohol use: No    Alcohol/week: 0.0 standard drinks   Allergies  Allergen Reactions  . Aspirin Anaphylaxis   Prior to Admission medications   Medication Sig Start Date End Date Taking? Authorizing Provider  albuterol (PROVENTIL HFA;VENTOLIN HFA) 108 (90 Base) MCG/ACT inhaler Inhale 2 puffs into the lungs every 6 (six) hours as needed for wheezing or shortness of breath. 08/30/15  Yes Epifanio Lesches, MD  albuterol (PROVENTIL) (2.5 MG/3ML) 0.083% nebulizer solution Take 2.5 mg every 4 (four) hours as needed by nebulization for wheezing or shortness of breath.   Yes [provider]  ALPRAZolam Duanne Moron) 0.5 MG tablet Take 0.5 mg by mouth at bedtime.   Yes [provider]  atorvastatin (LIPITOR) 40 MG tablet Take 1 tablet (40 mg total) by mouth at bedtime. 08/17/18  Yes Gollan, Kathlene November, MD  B-D UF III MINI PEN NEEDLES 31G X 5 MM MISC USE WITH INSULIN PEN INJECTIONS TWICE DAILY 08/05/18  Yes [provider]  budesonide (PULMICORT) 0.5 MG/2ML nebulizer solution Take 0.5 mg by nebulization daily.   Yes [provider]  Calcium Carbonate-Vitamin D3 (CALCIUM 600-D) 600-400 MG-UNIT TABS Take 1 tablet by mouth daily.    Yes [provider]  carvedilol (COREG) 3.125 MG tablet Take 1 tablet (3.125 mg total) by mouth 2 (two) times daily with a meal. 08/17/18  Yes Gollan, Kathlene November, MD  cetirizine (ZYRTEC) 10 MG tablet Take 10 mg by mouth daily.   Yes [provider]  clopidogrel (PLAVIX) 75 MG tablet Take 1 tablet (75 mg total) by mouth daily. 05/19/19  Yes Akeiba Axelson A, FNP  COMBIVENT RESPIMAT 20-100 MCG/ACT AERS respimat Inhale 1 puff into the lungs 4 (four) times daily as needed for wheezing.  07/30/18  Yes [provider]   famotidine (PEPCID) 20 MG tablet Take 20 mg by mouth at bedtime.   Yes [provider]  HYDROcodone-acetaminophen (NORCO) 7.5-325 MG tablet Take 1 tablet by mouth 3 (three) times daily. 06/05/19 07/05/19 Yes Molli Barrows, MD  HYDROcodone-acetaminophen (NORCO) 7.5-325 MG tablet Take 1 tablet by mouth every 6 (six) hours as needed for moderate pain or severe pain. 07/04/19 08/03/19 Yes Molli Barrows, MD  insulin aspart (NOVOLOG) 100 UNIT/ML injection Inject 10 Units into the skin 3 (three) times daily.    Yes [provider]  insulin glargine (LANTUS) 100 UNIT/ML injection Inject 30-40 Units into the skin See admin instructions. Inject 30 units in the morning at 40 units at bedtime.   Yes [provider]  isosorbide mononitrate (IMDUR) 30 MG 24 hr tablet Take 1 tablet (30 mg total)  by mouth 2 (two) times daily. 08/17/18  Yes Minna Merritts, MD  levETIRAcetam (KEPPRA) 750 MG tablet Take 750 mg by mouth 2 (two) times daily.   Yes [provider]  losartan (COZAAR) 100 MG tablet Take 1 tablet (100 mg total) by mouth daily. 08/17/18  Yes Gollan, Kathlene November, MD  meloxicam (MOBIC) 7.5 MG tablet Take 7.5 mg by mouth daily as needed for pain.    Yes [provider]  mometasone-formoterol (DULERA) 200-5 MCG/ACT AERO Inhale 2 puffs into the lungs 2 (two) times daily. 02/03/19  Yes Vaughan Basta, MD  montelukast (SINGULAIR) 10 MG tablet Take 10 mg by mouth daily.  11/05/17  Yes [provider]  nitroGLYCERIN (NITROSTAT) 0.4 MG SL tablet DISSOLVE ONE TABLET UNDER THE TONGUE EVERY 5 MINUTES AS NEEDED FOR CHEST PAIN.  DO NOT EXCEED A TOTAL OF 3 DOSES IN 15 MINUTES Patient taking differently: Place 0.4 mg under the tongue every 5 (five) minutes as needed for chest pain.  07/09/18  Yes Gollan, Kathlene November, MD  potassium chloride SA (KLOR-CON) 20 MEQ tablet Take 2 tablets (40 mEq total) by mouth 2 (two) times daily. Take extra 2 tablets (40 meq) if you take metolazone  02/06/19  Yes Belkys Henault, Otila Kluver A, FNP  sertraline (ZOLOFT) 50 MG tablet Take 100 mg by mouth daily.    Yes [provider]  tiotropium (SPIRIVA) 18 MCG inhalation capsule Place 18 mcg into inhaler and inhale daily.   Yes [provider]  torsemide (DEMADEX) 20 MG tablet Take 3 tablets (60 mg total) by mouth 2 (two) times daily. 03/23/19  Yes Anyi Fels, Otila Kluver A, FNP  traZODone (DESYREL) 50 MG tablet Take 50 mg by mouth at bedtime.   Yes [provider]     Review of Systems  Constitutional: Positive for fatigue ("improving"). Negative for appetite change.  HENT: Negative for congestion, postnasal drip and trouble swallowing.   Eyes: Negative.   Respiratory: Positive for chest tightness (at times), shortness of breath and wheezing. Negative for cough.   Cardiovascular: Negative for chest pain, palpitations and leg swelling.  Gastrointestinal: Negative for abdominal distention, abdominal pain and nausea.  Endocrine: Negative.   Genitourinary: Negative.   Musculoskeletal: Positive for arthralgias (left leg pain) and back pain (chronic).  Skin: Negative.   Allergic/Immunologic: Negative.   Neurological: Positive for light-headedness and headaches. Negative for dizziness and weakness.  Hematological: Negative for adenopathy. Does not bruise/bleed easily.  Psychiatric/Behavioral: Positive for dysphoric mood (improving). Negative for sleep disturbance (sleeping on 3 pillows) and suicidal ideas. The patient is not nervous/anxious.    Vitals:   05/19/19 0915  BP: (!) 137/47  Pulse: 60  Resp: 20  SpO2: 99%  Weight: (!) 303 lb 12.8 oz (137.8 kg)  Height: 5\' 11"  (1.803 m)   Wt Readings from Last 3 Encounters:  05/19/19 (!) 303 lb 12.8 oz (137.8 kg)  05/12/19 (!) 302 lb (137 kg)  03/23/19 (!) 302 lb 6.4 oz (137.2 kg)   Lab Results  Component Value Date   CREATININE 1.47 (H) 03/23/2019   CREATININE 1.12 (H) 02/02/2019   CREATININE 1.24 (H) 02/01/2019     Physical  Exam  Constitutional: She is oriented to person, place, and time. She appears well-developed and well-nourished.  HENT:  Head: Normocephalic and atraumatic.  Neck: No JVD present.  Cardiovascular: Normal rate and regular rhythm.  Pulmonary/Chest: Effort normal. She has no wheezes. She has no rales.  Abdominal: Soft. She exhibits no distension. There is no  abdominal tenderness.  Musculoskeletal:        General: No tenderness or edema.     Cervical back: Normal range of motion and neck supple.  Neurological: She is alert and oriented to person, place, and time.  Skin: Skin is warm and dry.  Psychiatric: She has a normal mood and affect. Her behavior is normal. Thought content normal.  Nursing note and vitals reviewed.  Assessment & Plan:  1: Chronic heart failure with preserved ejection fraction- - NYHA class II - euvolemic today - weighing daily; Reminded to call for an overnight weight gain of >2 pounds or a weekly weight gain of >5 pounds.  - weight stable from here last visit here 2 months ago - has not been adding salt to her foods and is trying to eat low sodium foods - will get BMP today to evaluate potassium level/ renal function - saw her cardiologist Rockey Situ) 11/09/17 - BNP 02/01/2019 was 64.0 - reports receiving her flu vaccine for this season  2: HTN- - BP good today - BMP from 03/23/2019 reviewed; sodium 139, potassium 3.4, creatinine 1.47 and GFR 41 - sees PCP at the [facility] on 05/23/19  3: Diabetes-  - fasting glucose at home this morning was 239 - A1c on 01/18/2019 was 6.5%  4: COPD- - saw pulmonology Lanney Gins) 03/08/2019 & returns next week - having a sleep study done 05/24/19  Patient did not bring her medications nor a list. Each medication was verbally reviewed with the patient and she was encouraged to bring the bottles to every visit to confirm accuracy of list.  Return in 4 months or sooner for any questions/problems before then.

## 2019-05-19 ENCOUNTER — Other Ambulatory Visit: Payer: Self-pay

## 2019-05-19 ENCOUNTER — Ambulatory Visit: Payer: Medicare HMO | Attending: Family | Admitting: Family

## 2019-05-19 ENCOUNTER — Encounter: Payer: Self-pay | Admitting: Family

## 2019-05-19 VITALS — BP 137/47 | HR 60 | Resp 20 | Ht 71.0 in | Wt 303.8 lb

## 2019-05-19 DIAGNOSIS — Z823 Family history of stroke: Secondary | ICD-10-CM | POA: Diagnosis not present

## 2019-05-19 DIAGNOSIS — Z7951 Long term (current) use of inhaled steroids: Secondary | ICD-10-CM | POA: Insufficient documentation

## 2019-05-19 DIAGNOSIS — Z808 Family history of malignant neoplasm of other organs or systems: Secondary | ICD-10-CM | POA: Insufficient documentation

## 2019-05-19 DIAGNOSIS — Z886 Allergy status to analgesic agent status: Secondary | ICD-10-CM | POA: Diagnosis not present

## 2019-05-19 DIAGNOSIS — Z90710 Acquired absence of both cervix and uterus: Secondary | ICD-10-CM | POA: Insufficient documentation

## 2019-05-19 DIAGNOSIS — I5032 Chronic diastolic (congestive) heart failure: Secondary | ICD-10-CM | POA: Diagnosis not present

## 2019-05-19 DIAGNOSIS — Z87891 Personal history of nicotine dependence: Secondary | ICD-10-CM | POA: Insufficient documentation

## 2019-05-19 DIAGNOSIS — E119 Type 2 diabetes mellitus without complications: Secondary | ICD-10-CM | POA: Diagnosis not present

## 2019-05-19 DIAGNOSIS — Z951 Presence of aortocoronary bypass graft: Secondary | ICD-10-CM | POA: Diagnosis not present

## 2019-05-19 DIAGNOSIS — G8929 Other chronic pain: Secondary | ICD-10-CM | POA: Insufficient documentation

## 2019-05-19 DIAGNOSIS — F419 Anxiety disorder, unspecified: Secondary | ICD-10-CM | POA: Insufficient documentation

## 2019-05-19 DIAGNOSIS — I252 Old myocardial infarction: Secondary | ICD-10-CM | POA: Insufficient documentation

## 2019-05-19 DIAGNOSIS — J449 Chronic obstructive pulmonary disease, unspecified: Secondary | ICD-10-CM | POA: Insufficient documentation

## 2019-05-19 DIAGNOSIS — Z9841 Cataract extraction status, right eye: Secondary | ICD-10-CM | POA: Insufficient documentation

## 2019-05-19 DIAGNOSIS — Z9842 Cataract extraction status, left eye: Secondary | ICD-10-CM | POA: Insufficient documentation

## 2019-05-19 DIAGNOSIS — E669 Obesity, unspecified: Secondary | ICD-10-CM | POA: Diagnosis not present

## 2019-05-19 DIAGNOSIS — I25709 Atherosclerosis of coronary artery bypass graft(s), unspecified, with unspecified angina pectoris: Secondary | ICD-10-CM | POA: Insufficient documentation

## 2019-05-19 DIAGNOSIS — Z79899 Other long term (current) drug therapy: Secondary | ICD-10-CM | POA: Diagnosis not present

## 2019-05-19 DIAGNOSIS — F329 Major depressive disorder, single episode, unspecified: Secondary | ICD-10-CM | POA: Diagnosis not present

## 2019-05-19 DIAGNOSIS — I110 Hypertensive heart disease with heart failure: Secondary | ICD-10-CM | POA: Insufficient documentation

## 2019-05-19 DIAGNOSIS — Z8249 Family history of ischemic heart disease and other diseases of the circulatory system: Secondary | ICD-10-CM | POA: Insufficient documentation

## 2019-05-19 DIAGNOSIS — Z7902 Long term (current) use of antithrombotics/antiplatelets: Secondary | ICD-10-CM | POA: Insufficient documentation

## 2019-05-19 DIAGNOSIS — Z833 Family history of diabetes mellitus: Secondary | ICD-10-CM | POA: Insufficient documentation

## 2019-05-19 DIAGNOSIS — Z955 Presence of coronary angioplasty implant and graft: Secondary | ICD-10-CM | POA: Insufficient documentation

## 2019-05-19 DIAGNOSIS — Z794 Long term (current) use of insulin: Secondary | ICD-10-CM | POA: Insufficient documentation

## 2019-05-19 DIAGNOSIS — Z6841 Body Mass Index (BMI) 40.0 and over, adult: Secondary | ICD-10-CM | POA: Diagnosis not present

## 2019-05-19 DIAGNOSIS — I10 Essential (primary) hypertension: Secondary | ICD-10-CM

## 2019-05-19 DIAGNOSIS — R569 Unspecified convulsions: Secondary | ICD-10-CM | POA: Insufficient documentation

## 2019-05-19 LAB — BASIC METABOLIC PANEL
Anion gap: 11 (ref 5–15)
BUN: 27 mg/dL — ABNORMAL HIGH (ref 8–23)
CO2: 26 mmol/L (ref 22–32)
Calcium: 8.6 mg/dL — ABNORMAL LOW (ref 8.9–10.3)
Chloride: 102 mmol/L (ref 98–111)
Creatinine, Ser: 1.13 mg/dL — ABNORMAL HIGH (ref 0.44–1.00)
GFR calc Af Amer: 57 mL/min — ABNORMAL LOW (ref >60)
GFR calc non Af Amer: 49 mL/min — ABNORMAL LOW (ref >60)
Glucose, Bld: 271 mg/dL — ABNORMAL HIGH (ref 70–99)
Potassium: 3.8 mmol/L (ref 3.5–5.1)
Sodium: 139 mmol/L (ref 135–145)

## 2019-05-19 MED ORDER — CLOPIDOGREL BISULFATE 75 MG PO TABS: 75.0000 mg | ORAL_TABLET | Freq: Every day | ORAL | 3 refills | Status: DC

## 2019-05-19 NOTE — Patient Instructions (Signed)
Continue weighing daily and call for an overnight weight gain of > 2 pounds or a weekly weight gain of >5 pounds. 

## 2019-05-20 ENCOUNTER — Other Ambulatory Visit
Admission: RE | Admit: 2019-05-20 | Discharge: 2019-05-20 | Disposition: A | Discharge status: Home / Self Care | Payer: Medicare HMO | Source: Ambulatory Visit | Attending: Family | Admitting: Family

## 2019-05-20 DIAGNOSIS — Z20822 Contact with and (suspected) exposure to covid-19: Secondary | ICD-10-CM | POA: Insufficient documentation

## 2019-05-20 DIAGNOSIS — Z01812 Encounter for preprocedural laboratory examination: Secondary | ICD-10-CM | POA: Insufficient documentation

## 2019-05-20 LAB — SARS CORONAVIRUS 2 (TAT 6-24 HRS): SARS Coronavirus 2: NEGATIVE

## 2019-05-24 ENCOUNTER — Ambulatory Visit: Payer: Medicare HMO | Attending: Internal Medicine

## 2019-05-24 DIAGNOSIS — I509 Heart failure, unspecified: Secondary | ICD-10-CM | POA: Diagnosis not present

## 2019-05-24 DIAGNOSIS — G4733 Obstructive sleep apnea (adult) (pediatric): Secondary | ICD-10-CM | POA: Insufficient documentation

## 2019-05-25 ENCOUNTER — Other Ambulatory Visit: Payer: Self-pay

## 2019-05-30 ENCOUNTER — Other Ambulatory Visit: Payer: Self-pay | Admitting: Surgery

## 2019-05-30 DIAGNOSIS — M1712 Unilateral primary osteoarthritis, left knee: Secondary | ICD-10-CM

## 2019-05-30 DIAGNOSIS — B351 Tinea unguium: Secondary | ICD-10-CM | POA: Insufficient documentation

## 2019-06-08 ENCOUNTER — Ambulatory Visit
Admission: RE | Admit: 2019-06-08 | Discharge: 2019-06-08 | Disposition: A | Discharge status: Home / Self Care | Payer: Medicare HMO | Source: Ambulatory Visit | Attending: Surgery | Admitting: Surgery

## 2019-06-08 ENCOUNTER — Other Ambulatory Visit: Payer: Self-pay

## 2019-06-08 DIAGNOSIS — M1712 Unilateral primary osteoarthritis, left knee: Secondary | ICD-10-CM | POA: Diagnosis present

## 2019-07-05 ENCOUNTER — Other Ambulatory Visit (INDEPENDENT_AMBULATORY_CARE_PROVIDER_SITE_OTHER): Payer: Self-pay | Admitting: Vascular Surgery

## 2019-07-05 ENCOUNTER — Ambulatory Visit (INDEPENDENT_AMBULATORY_CARE_PROVIDER_SITE_OTHER): Payer: Medicare HMO | Admitting: Vascular Surgery

## 2019-07-05 ENCOUNTER — Other Ambulatory Visit (INDEPENDENT_AMBULATORY_CARE_PROVIDER_SITE_OTHER): Payer: Medicare HMO

## 2019-07-05 ENCOUNTER — Other Ambulatory Visit: Payer: Self-pay

## 2019-07-05 ENCOUNTER — Encounter (INDEPENDENT_AMBULATORY_CARE_PROVIDER_SITE_OTHER): Payer: Self-pay | Admitting: Vascular Surgery

## 2019-07-05 VITALS — BP 105/69 | HR 85 | Resp 16 | Ht 71.0 in | Wt 300.0 lb

## 2019-07-05 DIAGNOSIS — I6509 Occlusion and stenosis of unspecified vertebral artery: Secondary | ICD-10-CM

## 2019-07-05 DIAGNOSIS — I7025 Atherosclerosis of native arteries of other extremities with ulceration: Secondary | ICD-10-CM

## 2019-07-05 DIAGNOSIS — E119 Type 2 diabetes mellitus without complications: Secondary | ICD-10-CM | POA: Diagnosis not present

## 2019-07-05 DIAGNOSIS — I10 Essential (primary) hypertension: Secondary | ICD-10-CM

## 2019-07-05 DIAGNOSIS — I25118 Atherosclerotic heart disease of native coronary artery with other forms of angina pectoris: Secondary | ICD-10-CM

## 2019-07-05 DIAGNOSIS — Z794 Long term (current) use of insulin: Secondary | ICD-10-CM

## 2019-07-05 DIAGNOSIS — L97509 Non-pressure chronic ulcer of other part of unspecified foot with unspecified severity: Secondary | ICD-10-CM

## 2019-07-05 NOTE — Assessment & Plan Note (Signed)
Continue cardiac and antihypertensive medications as already ordered and reviewed, no changes at this time. Continue statin as ordered and reviewed, no changes at this time Nitrates PRN for chest pain  

## 2019-07-05 NOTE — Progress Notes (Signed)
Patient ID: Tina Hayes, female   DOB: 1945/06/03, 74 y.o.   MRN: [id number]  Chief Complaint  Patient presents with  . New Patient (Initial Visit)    ref Tina Hayes paronychia of right toe    HPI Tina Hayes is a 74 y.o. female.  I am asked to see the patient by Dr. Cleda Hayes for evaluation of nonhealing ulceration after removal of the right great toenail.  Patient has significant pain in her forefoot that wakes her from night and is concerning for ischemic rest pain on the right foot.  She has a previous history of vertebral artery disease which we have managed medically and saw her about a year ago.  She has a litany of medical issues as listed below.  She had infection in the toenail and had this surgically treated a couple of months ago but it will not heal.  Her toe remains painful.  The infection has gotten better with antibiotics.  She denies any fevers or chills.  No chest pain or shortness of breath.  No current left leg symptoms of rest pain or ulceration.  She does not walk much really cannot assess for claudication.   To evaluate her perfusion noninvasive studies were performed today.  Her right ABI 0.55 with a digit pressure of only 38.  Her left ABI 0.99 with a digit pressure of 147.  Her waveforms are markedly blunted on the right and pretty good on the left.   Past Medical History:  Diagnosis Date  . (HFpEF) heart failure with preserved ejection fraction ([place])    a. 05/2016 Echo: EF 60-65%, mild to mod LVH, Gr1 DD, mild MR, mildly dil LA, mod TR, mildly to mod increased PASP.  [name] Acute diastolic heart failure ([place]) 01/27/2017  . Anxiety   . Chest pain 06/16/2016  . CHF (congestive heart failure) ([place])   . Chronic back pain   . Chronic diastolic congestive heart failure ([place]) 02/13/2017  . COPD (chronic obstructive pulmonary disease) ([place])   . Coronary artery disease    a. s/p remote PCI x 5;  b. 2006 s/p CABG x 3 ([place]); b. 05/2016 MV:  attenuation corrected images w/o ischemia or wma-->Med rx.  . Coronary artery disease of native artery of native heart with stable angina pectoris ([place]) 06/17/2016  . Depression   . Diabetes mellitus without complication ([place])   . Essential hypertension 06/30/2016  . Heart attack ([place])    Total of 3 per pt.  . Hypertension   . Hypertensive urgency 06/03/2015  . Seizure ([place])   . Seizures ([place])     Past Surgical History:  Procedure Laterality Date  . ABDOMINAL HYSTERECTOMY    . ANKLE SURGERY Right   . CATARACT EXTRACTION W/ INTRAOCULAR LENS  IMPLANT, BILATERAL    . COLONOSCOPY WITH PROPOFOL N/A 05/04/2017   Procedure: COLONOSCOPY WITH PROPOFOL;  Surgeon: [name], MD;  Location: [facility] ENDOSCOPY;  Service: Endoscopy;  Laterality: N/A;  . CORONARY ANGIOPLASTY    . CORONARY ARTERY BYPASS GRAFT     TRIPLE BYPASS  . ESOPHAGOGASTRODUODENOSCOPY (EGD) WITH PROPOFOL N/A 05/04/2017   Procedure: ESOPHAGOGASTRODUODENOSCOPY (EGD) WITH PROPOFOL;  Surgeon: [name], MD;  Location: [facility] ENDOSCOPY;  Service: Endoscopy;  Laterality: N/A;  . EXCISION MASS LOWER EXTREMETIES Right 03/02/2018   Procedure: EXCISION SOFT TISSUE MASS FROM MEDIAL ASPECT OF RIGHT ANKLE;  Surgeon: [name], MD;  Location: [facility] ORS;  Service: Orthopedics;  Laterality: Right;  .  FRACTURE SURGERY    . KNEE SURGERY Right   . MOUTH SURGERY Left 07/14/2017     Family History  Problem Relation Age of Onset  . Diabetes Other   . Hypertension Other   . Diabetes Mother   . Heart failure Mother   . Heart disease Mother   . Heart attack Mother   . Stroke Mother   . Depression Mother   . Hypertension Mother   . Cancer Sister        brain  . Hypertension Sister   . Diabetes Brother   . Hypertension Brother   . Heart failure Sister   . Heart attack Sister   . SIDS Sister     Social History   Tobacco Use  . Smoking status: Former Smoker    Packs/day: 0.25    Years: 20.00    Pack years: 5.00    Types:  Cigarettes    Quit date: 07/01/2006    Years since quitting: 13.0  . Smokeless tobacco: Never Used  Substance Use Topics  . Alcohol use: No    Alcohol/week: 0.0 standard drinks  . Drug use: No     Allergies  Allergen Reactions  . Aspirin Anaphylaxis    Current Outpatient Medications  Medication Sig Dispense Refill  . albuterol (PROVENTIL HFA;VENTOLIN HFA) 108 (90 Base) MCG/ACT inhaler Inhale 2 puffs into the lungs every 6 (six) hours as needed for wheezing or shortness of breath. 1 Inhaler 2  . albuterol (PROVENTIL) (2.5 MG/3ML) 0.083% nebulizer solution Take 2.5 mg every 4 (four) hours as needed by nebulization for wheezing or shortness of breath.    . ALPRAZolam (XANAX) 0.5 MG tablet Take 0.5 mg by mouth at bedtime.    [name] atorvastatin (LIPITOR) 40 MG tablet Take 1 tablet (40 mg total) by mouth at bedtime. 30 tablet 11  . B-D UF III MINI PEN NEEDLES 31G X 5 MM MISC USE WITH INSULIN PEN INJECTIONS TWICE DAILY    . budesonide (PULMICORT) 0.5 MG/2ML nebulizer solution Take 0.5 mg by nebulization daily.    . Calcium Carbonate-Vitamin D3 (CALCIUM 600-D) 600-400 MG-UNIT TABS Take 1 tablet by mouth daily.     . carvedilol (COREG) 3.125 MG tablet Take 1 tablet (3.125 mg total) by mouth 2 (two) times daily with a meal. 60 tablet 11  . cetirizine (ZYRTEC) 10 MG tablet Take 10 mg by mouth daily.    . citalopram (CELEXA) 20 MG tablet Take 20 mg by mouth daily.    . clopidogrel (PLAVIX) 75 MG tablet Take 1 tablet (75 mg total) by mouth daily. 90 tablet 3  . COMBIVENT RESPIMAT 20-100 MCG/ACT AERS respimat Inhale 1 puff into the lungs 4 (four) times daily as needed for wheezing.     . Dupilumab (DUPIXENT) 300 MG/2ML SOPN Inject into the skin every 14 (fourteen) days.    . famotidine (PEPCID) 20 MG tablet Take 20 mg by mouth at bedtime.    . fluticasone (FLONASE) 50 MCG/ACT nasal spray Place into both nostrils daily.    . furosemide (LASIX) 40 MG tablet Take 40 mg by mouth.    . insulin aspart  (NOVOLOG) 100 UNIT/ML injection Inject 10 Units into the skin 3 (three) times daily.     . insulin glargine (LANTUS) 100 UNIT/ML injection Inject 30-40 Units into the skin See admin instructions. Inject 30 units in the morning at 40 units at bedtime.    . isosorbide mononitrate (IMDUR) 30 MG 24 hr tablet Take 1 tablet (30  mg total) by mouth 2 (two) times daily. 60 tablet 11  . levETIRAcetam (KEPPRA) 750 MG tablet Take 750 mg by mouth 2 (two) times daily.    [name] losartan (COZAAR) 100 MG tablet Take 1 tablet (100 mg total) by mouth daily. 30 tablet 11  . meloxicam (MOBIC) 7.5 MG tablet Take 7.5 mg by mouth daily as needed for pain.     . metolazone (ZAROXOLYN) 2.5 MG tablet Take 2.5 mg by mouth daily.    . mometasone-formoterol (DULERA) 200-5 MCG/ACT AERO Inhale 2 puffs into the lungs 2 (two) times daily. 13 g 0  . montelukast (SINGULAIR) 10 MG tablet Take 10 mg by mouth daily.     . nitroGLYCERIN (NITROSTAT) 0.4 MG SL tablet DISSOLVE ONE TABLET UNDER THE TONGUE EVERY 5 MINUTES AS NEEDED FOR CHEST PAIN.  DO NOT EXCEED A TOTAL OF 3 DOSES IN 15 MINUTES (Patient taking differently: Place 0.4 mg under the tongue every 5 (five) minutes as needed for chest pain. ) 25 tablet 0  . potassium chloride SA (KLOR-CON) 20 MEQ tablet Take 2 tablets (40 mEq total) by mouth 2 (two) times daily. Take extra 2 tablets (40 meq) if you take metolazone 140 tablet 5  . rOPINIRole (REQUIP) 1 MG tablet 1 mg as directed.    . rosuvastatin (CRESTOR) 10 MG tablet Take 10 mg by mouth daily.    [name] tiotropium (SPIRIVA) 18 MCG inhalation capsule Place 18 mcg into inhaler and inhale daily.    [name] torsemide (DEMADEX) 20 MG tablet Take 3 tablets (60 mg total) by mouth 2 (two) times daily. 540 tablet 3  . traZODone (DESYREL) 50 MG tablet Take 50 mg by mouth at bedtime.    [name] HYDROcodone-acetaminophen (NORCO) 7.5-325 MG tablet Take 1 tablet by mouth 3 (three) times daily. (Patient not taking: Reported on 07/05/2019) 90 tablet 0  .  HYDROcodone-acetaminophen (NORCO) 7.5-325 MG tablet Take 1 tablet by mouth every 6 (six) hours as needed for moderate pain or severe pain. (Patient not taking: Reported on 07/05/2019) 90 tablet 0  . sertraline (ZOLOFT) 50 MG tablet Take 100 mg by mouth daily.      No current facility-administered medications for this visit.     REVIEW OF SYSTEMS (Negative unless checked)  Constitutional: [] ?Weight loss  [] ?Fever  [] ?Chills Cardiac: [x] ?Chest pain   [] ?Chest pressure   [] ?Palpitations   [] ?Shortness of breath when laying flat   [] ?Shortness of breath at rest   [x] ?Shortness of breath with exertion. Vascular:  [x] ?Pain in legs with walking   [] ?Pain in legs at rest   [] ?Pain in legs when laying flat   [] ?Claudication   [] ?Pain in feet when walking  [] ?Pain in feet at rest  [] ?Pain in feet when laying flat   [] ?History of DVT   [] ?Phlebitis   [] ?Swelling in legs   [] ?Varicose veins   [x] ?Non-healing ulcers Pulmonary:   [] ?Uses home oxygen   [] ?Productive cough   [] ?Hemoptysis   [] ?Wheeze  [x] ?COPD   [] ?Asthma Neurologic:  [] ?Dizziness  [] ?Blackouts   [x] ?Seizures   [] ?History of stroke   [] ?History of TIA  [] ?Aphasia   [] ?Temporary blindness   [] ?Dysphagia   [] ?Weakness or numbness in arms   [] ?Weakness or numbness in legs Musculoskeletal:  [x] ?Arthritis   [] ?Joint swelling   [x] ?Joint pain   [] ?Low back pain Hematologic:  [] ?Easy bruising  [] ?Easy bleeding   [] ?Hypercoagulable state   [] ?Anemic  [] ?Hepatitis Gastrointestinal:  [] ?Blood in stool   [] ?Vomiting blood  [] ?Gastroesophageal reflux/heartburn   [] ?  Abdominal pain Genitourinary:  [] ?Chronic kidney disease   [] ?Difficult urination  [] ?Frequent urination  [] ?Burning with urination   [] ?Hematuria Skin:  [] ?Rashes   [x] ?Ulcers   [x] ?Wounds Psychological:  [x] ?History of anxiety   [x] ? History of major depression.     Physical Exam BP 105/69 (BP Location: Right Arm)   Pulse 85   Resp 16   Ht 5\' 11"  (1.803 m)   Wt 300 lb (136.1 kg)   BMI  41.84 kg/m  Gen:  WD/WN, NAD Head: [place]/AT, No temporalis wasting.  Ear/Nose/Throat: Hearing grossly intact, nares w/o erythema or drainage, oropharynx w/o Erythema/Exudate Eyes: Conjunctiva clear, sclera non-icteric  Neck: trachea midline.  No JVD.  Pulmonary:  Good air movement, respirations not labored, no use of accessory muscles  Cardiac: RRR, no JVD Vascular:  Vessel Right Left  Radial Palpable Palpable                          DP trace 1+  PT NP 1+    Musculoskeletal: M/S 5/5 throughout.  Extremities without ischemic changes.  No deformity or atrophy.  Ulceration where the right great toenail was previously.  No erythema or drainage.  Trace to 1+ bilateral lower extremity edema.  In a wheelchair. Neurologic: Sensation grossly intact in extremities.  Symmetrical.  Speech is fluent. Motor exam as listed above. Psychiatric: Judgment intact, Mood & affect appropriate for pt's clinical situation. Dermatologic: Wound at the previous site of the toenail with mild fibrinous exudate.  No erythema or drainage.    Radiology CT KNEE LEFT WO CONTRAST  Result Date: 06/08/2019 CLINICAL DATA:  Left knee pain, preop left knee arthroplasty EXAM: CT OF THE LEFT KNEE WITHOUT CONTRAST TECHNIQUE: Multidetector CT imaging of the LEFT knee was performed according to the standard protocol. Multiplanar CT image reconstructions were also generated. COMPARISON:  None. FINDINGS: Bones/Joint/Cartilage No fracture or dislocation. Normal alignment. No joint effusion. Severe medial femorotibial compartment joint space narrowing with a bone-on-bone appearance, subchondral cystic changes and marginal osteophytes. Moderate lateral femorotibial compartment joint space narrowing with marginal osteophytes. Severe patellofemoral compartment joint space narrowing with marginal osteophytes. Visualized portion of the left hip demonstrates no focal abnormality. Mild osteoarthritis of the left SI joint. No aggressive  osseous lesion. No periosteal reaction or bone destruction. Ligaments Ligaments are suboptimally evaluated by CT. Muscles and Tendons Muscles are normal.  No muscle atrophy. Soft tissue No fluid collection or hematoma.  No soft tissue mass. IMPRESSION: 1. Severe tricompartmental osteoarthritis of left knee. Electronically Signed   By: Kathreen Devoid   On: 06/08/2019 14:25   VAS [place] ABI WITH/WO TBI  Result Date: 07/05/2019 LOWER EXTREMITY DOPPLER STUDY Indications: Ulceration.  Performing Technologist: Blondell Reveal RT, RDMS, RVT  Examination Guidelines: A complete evaluation includes at minimum, Doppler waveform signals and systolic blood pressure reading at the level of bilateral brachial, anterior tibial, and posterior tibial arteries, when vessel segments are accessible. Bilateral testing is considered an integral part of a complete examination. Photoelectric Plethysmograph (PPG) waveforms and toe systolic pressure readings are included as required and additional duplex testing as needed. Limited examinations for reoccurring indications may be performed as noted.  ABI Findings: +---------+------------------+-----+----------+----------------+ Right    Rt Pressure (mmHg)IndexWaveform  Comment          +---------+------------------+-----+----------+----------------+ Brachial 139                                               +---------+------------------+-----+----------+----------------+  ATA      87                0.55 monophasic                 +---------+------------------+-----+----------+----------------+ PTA                             monophasicnon-compressible +---------+------------------+-----+----------+----------------+ Great Toe38                0.24 Abnormal                   +---------+------------------+-----+----------+----------------+ +---------+------------------+-----+---------+----------------+ Left     Lt Pressure (mmHg)IndexWaveform Comment           +---------+------------------+-----+---------+----------------+ Brachial 158                                              +---------+------------------+-----+---------+----------------+ ATA      157               0.99 triphasic                 +---------+------------------+-----+---------+----------------+ PTA                             triphasicnon-compressible +---------+------------------+-----+---------+----------------+ Great Toe147               0.93 Normal                    +---------+------------------+-----+---------+----------------+ No previous exam available for comparison.  Summary: Right: Doppler waveforms and obtained ABI suggest moderate arterial occlusive disease, however, the ABI may be unreliable due to medial calcification. Abnormal right toe-brachial index. Left: Doppler waveforms and obtained ABI suggest no significant arterial occlusive disease, however, the ABI may be unreliable due to medial calcification. Normal right toe-brachial index.  *See table(s) above for measurements and observations.  Electronically signed by Leotis Pain MD on 07/05/2019 at 4:36:02 PM.   Final     Labs Recent Results (from the past 2160 hour(s))  Basic metabolic panel     Status: Abnormal   Collection Time: 05/19/19  9:44 AM  Result Value Ref Range   Sodium 139 135 - 145 mmol/L   Potassium 3.8 3.5 - 5.1 mmol/L   Chloride 102 98 - 111 mmol/L   CO2 26 22 - 32 mmol/L   Glucose, Bld 271 (H) 70 - 99 mg/dL   BUN 27 (H) 8 - 23 mg/dL   Creatinine, Ser 1.13 (H) 0.44 - 1.00 mg/dL   Calcium 8.6 (L) 8.9 - 10.3 mg/dL   GFR calc non Af Amer 49 (L) >60 mL/min   GFR calc Af Amer 57 (L) >60 mL/min   Anion gap 11 5 - 15    Comment: Performed at [facility], [address] [place], [place] [address]  SARS CORONAVIRUS 2 (TAT 6-24 HRS) Nasopharyngeal Nasopharyngeal Swab     Status: None   Collection Time: 05/20/19 11:00 AM   Specimen: Nasopharyngeal Swab  Result Value Ref Range     SARS Coronavirus 2 NEGATIVE NEGATIVE    Comment: (NOTE) SARS-CoV-2 target nucleic acids are NOT DETECTED. The SARS-CoV-2 RNA is generally detectable in upper and lower respiratory specimens during the acute phase of infection. Negative results do not preclude SARS-CoV-2 infection, do  not rule out co-infections with other pathogens, and should not be used as the sole basis for treatment or other patient management decisions. Negative results must be combined with clinical observations, patient history, and epidemiological information. The expected result is Negative. Fact Sheet for Patients: SugarRoll.be Fact Sheet for Healthcare Providers: https://www.woods-mathews.com/ This test is not yet approved or cleared by the [place] FDA and  has been authorized for detection and/or diagnosis of SARS-CoV-2 by FDA under an Emergency Use Authorization (EUA). This EUA will remain  in effect (meaning this test can be used) for the duration of the COVID-19 declaration under Section 56 4(b)(1) of the Act, 21 U.S.C. section 360bbb-3(b)(1), unless the authorization is terminated or revoked sooner. Performed at [facility], [place] [address], [place], [place] [address]     Assessment/Plan: Essential hypertension blood pressure control important in reducing the progression of atherosclerotic disease. On appropriate oral medications.   Diabetes mellitus ([place]) blood glucose control important in reducing the progression of atherosclerotic disease. Also, involved in wound healing. On appropriate medications.  Occlusion and stenosis of vertebral artery Seen last year for this.  Medical therapy only.  Coronary artery disease of native artery of native heart with stable angina pectoris (HCC) Continue cardiac and antihypertensive medications as already ordered and reviewed, no changes at this time. Continue statin as ordered and reviewed, no changes  at this time Nitrates PRN for chest pain   Atherosclerosis of native arteries of the extremities with ulceration ([place]) To evaluate her perfusion noninvasive studies were performed today.  Her right ABI 0.55 with a digit pressure of only 38.  Her left ABI 0.99 with a digit pressure of 147.  Her waveforms are markedly blunted on the right and pretty good on the left.  Recommend:  The patient has evidence of severe atherosclerotic changes of the right lower extremity associated with ulceration and tissue loss of the foot.  This represents a limb threatening ischemia and places the patient at the risk for limb loss.  Patient should undergo angiography of the right lower extremity with the hope for intervention for limb salvage.  The risks and benefits as well as the alternative therapies was discussed in detail with the patient.  All questions were answered.  Patient agrees to proceed with angiography.  The patient will follow up with me in the office after the procedure.        Leotis Pain 07/05/2019, 5:16 PM   This note was created with Dragon medical transcription system.  Any errors from dictation are unintentional.

## 2019-07-05 NOTE — Patient Instructions (Signed)
Peripheral Vascular Disease  Peripheral vascular disease (PVD) is a disease of the blood vessels that are not part of your heart and brain. A simple term for PVD is poor circulation. In most cases, PVD narrows the blood vessels that carry blood from your heart to the rest of your body. This can reduce the supply of blood to your arms, legs, and internal organs, like your stomach or kidneys. However, PVD most often affects a person's lower legs and feet. Without treatment, PVD tends to get worse. PVD can also lead to acute ischemic limb. This is when an arm or leg suddenly cannot get enough blood. This is a medical emergency. Follow these instructions at home: Lifestyle  Do not use any products that contain nicotine or tobacco, such as cigarettes and e-cigarettes. If you need help quitting, ask your doctor.  Lose weight if you are overweight. Or, stay at a healthy weight as told by your doctor.  Eat a diet that is low in fat and cholesterol. If you need help, ask your doctor.  Exercise regularly. Ask your doctor for activities that are right for you. General instructions  Take over-the-counter and prescription medicines only as told by your doctor.  Take good care of your feet: ? Wear comfortable shoes that fit well. ? Check your feet often for any cuts or sores.  Keep all follow-up visits as told by your doctor This is important. Contact a doctor if:  You have cramps in your legs when you walk.  You have leg pain when you are at rest.  You have coldness in a leg or foot.  Your skin changes.  You are unable to get or have an erection (erectile dysfunction).  You have cuts or sores on your feet that do not heal. Get help right away if:  Your arm or leg turns cold, numb, and blue.  Your arms or legs become red, warm, swollen, painful, or numb.  You have chest pain.  You have trouble breathing.  You suddenly have weakness in your face, arm, or leg.  You become very  confused or you cannot speak.  You suddenly have a very bad headache.  You suddenly cannot see. Summary  Peripheral vascular disease (PVD) is a disease of the blood vessels.  A simple term for PVD is poor circulation. Without treatment, PVD tends to get worse.  Treatment may include exercise, low fat and low cholesterol diet, and quitting smoking. This information is not intended to replace advice given to you by your health care provider. Make sure you discuss any questions you have with your health care provider. Document Revised: 03/27/2017 Document Reviewed: 05/22/2016 Elsevier Patient Education  2020 Elsevier Inc.  

## 2019-07-05 NOTE — Assessment & Plan Note (Signed)
Seen last year for this.  Medical therapy only.

## 2019-07-05 NOTE — Assessment & Plan Note (Addendum)
To evaluate her perfusion noninvasive studies were performed today.  Her right ABI 0.55 with a digit pressure of only 38.  Her left ABI 0.99 with a digit pressure of 147.  Her waveforms are markedly blunted on the right and pretty good on the left.  Recommend:  The patient has evidence of severe atherosclerotic changes of the right lower extremity associated with ulceration and tissue loss of the foot.  This represents a limb threatening ischemia and places the patient at the risk for limb loss.  Patient should undergo angiography of the right lower extremity with the hope for intervention for limb salvage.  The risks and benefits as well as the alternative therapies was discussed in detail with the patient.  All questions were answered.  Patient agrees to proceed with angiography.  The patient will follow up with me in the office after the procedure.

## 2019-07-06 ENCOUNTER — Telehealth (INDEPENDENT_AMBULATORY_CARE_PROVIDER_SITE_OTHER): Payer: Self-pay

## 2019-07-06 NOTE — Telephone Encounter (Signed)
Spoke with the patient and she is now scheduled with Dr. Lucky Cowboy for a RLE angio on 07/14/19 with a 8:30 am arrival time to the MM. Patient will do covid testing on 07/12/19 between 12:30-2:30 pm. Pre-procedure instructions were discussed and will be mailed to the patient.

## 2019-07-08 ENCOUNTER — Other Ambulatory Visit: Payer: Self-pay | Admitting: Surgery

## 2019-07-12 ENCOUNTER — Other Ambulatory Visit
Admission: RE | Admit: 2019-07-12 | Discharge: 2019-07-12 | Disposition: A | Discharge status: Home / Self Care | Payer: Medicare HMO | Source: Ambulatory Visit | Attending: Vascular Surgery | Admitting: Vascular Surgery

## 2019-07-12 ENCOUNTER — Other Ambulatory Visit: Payer: Self-pay

## 2019-07-12 DIAGNOSIS — Z20822 Contact with and (suspected) exposure to covid-19: Secondary | ICD-10-CM | POA: Diagnosis not present

## 2019-07-12 DIAGNOSIS — Z01812 Encounter for preprocedural laboratory examination: Secondary | ICD-10-CM | POA: Insufficient documentation

## 2019-07-13 ENCOUNTER — Encounter
Admission: RE | Admit: 2019-07-13 | Discharge: 2019-07-13 | Disposition: A | Discharge status: Home / Self Care | Payer: Medicare HMO | Source: Ambulatory Visit | Attending: Vascular Surgery | Admitting: Vascular Surgery

## 2019-07-13 ENCOUNTER — Other Ambulatory Visit (INDEPENDENT_AMBULATORY_CARE_PROVIDER_SITE_OTHER): Payer: Self-pay | Admitting: Nurse Practitioner

## 2019-07-13 HISTORY — DX: Gastro-esophageal reflux disease without esophagitis: K21.9

## 2019-07-13 HISTORY — DX: Unspecified osteoarthritis, unspecified site: M19.90

## 2019-07-13 LAB — SARS CORONAVIRUS 2 (TAT 6-24 HRS): SARS Coronavirus 2: NEGATIVE

## 2019-07-13 NOTE — Patient Instructions (Signed)
Your procedure is scheduled on: July 26, 2019 TUESDAY Report to Day Surgery on the 2nd floor of the [facility]. To find out your arrival time, please call [phone] between 1PM - 3PM on: Monday July 25, 2019  REMEMBER: Instructions that are not followed completely may result in serious medical risk, up to and including death; or upon the discretion of your surgeon and anesthesiologist your surgery may need to be rescheduled.  Do not eat food after midnight the night before surgery.  No gum chewing, lozengers or hard candies.  You may however, drink CLEAR liquids up to 2 hours before you are scheduled to arrive for your surgery. Do not drink anything within 2 hours of the start of your surgery.  Clear liquids include: - water   Do NOT drink anything that is not on this list.  Type 1 and Type 2 diabetics should only drink water.  ENSURE PRE-SURGERY CARBOHYDRATE DRINK:  Complete drinking 2 hours prior to scheduled arrival time.  TAKE THESE MEDICATIONS THE MORNING OF SURGERY WITH A SIP OF WATER: SINGULAR AMLODIPINE COREG ISOSORBIDE KEPPRA Shannon (take one the night before and one on the morning of surgery - helps to prevent nausea after surgery.)  Use inhalers and nebulizer on the day of surgery   Take 1/2 of usual insulin dose the night before surgery and none on the morning of surgery.  Follow recommendations from Cardiologist, Pulmonologist or PCP regarding stopping Plavix. STOP PLAVIX - LAST DOSE 07-15-2019  Stop Anti-inflammatories (NSAIDS) such as Advil, Aleve, Ibuprofen, Motrin, Naproxen, Naprosyn and Aspirin based products such as Excedrin, Goodys Powder, BC Powder AND MELOXICAM - LAST DOSE 07-15-2019 (May take Tylenol or Acetaminophen if needed.)  Stop ANY OVER THE COUNTER supplements until after surgery. (May continue Vitamin D, Vitamin B, and multivitamin.)  No Alcohol for 24 hours before or after surgery.  No Smoking including e-cigarettes for 24 hours prior  to surgery.  No chewable tobacco products for at least 6 hours prior to surgery.  No nicotine patches on the day of surgery.  On the morning of surgery brush your teeth with toothpaste and water, you may rinse your mouth with mouthwash if you wish. Do not swallow any toothpaste or mouthwash.  Do not wear jewelry, make-up, hairpins, clips or nail polish.  Do not wear lotions, powders, or perfumes.   Do not shave 48 hours prior to surgery.   Contact lenses, hearing aids and dentures may not be worn into surgery.  Do not bring valuables to the hospital, including drivers license, insurance or credit cards.  [facility] is not responsible for any belongings or valuables.   Use CHG Soap or wipes as directed on instruction sheet.  Notify your doctor if there is any change in your medical condition (cold, fever, infection).  Wear comfortable clothing (specific to your surgery type) to the hospital.  Plan for stool softeners for home use.  If you are being admitted to the hospital overnight, you may bring a small bag with you to hospital After surgery it may be brought to your room.  If you are being discharged the day of surgery, you will not be allowed to drive home. You will need a responsible adult to drive you home and stay with you that night.   If you are taking public transportation, you will need to have a responsible adult with you. Please confirm with your physician that it is acceptable to use public transportation.   Please call [phone]  if you have any questions about these instructions.  Visitation Policy:  Patients undergoing a surgery or procedure in a hospital may have one family member or support person with them as long as that person is not COVID-19 positive or experiencing its symptoms. That person may remain in the waiting area during the procedure. Should the patient need to stay at the hospital during part of their recovery, the support person may visit  during visiting hours; 10 am to 8 pm.  Children under 83 years of age may have both parents or legal guardians with them during their hospital stay.

## 2019-07-14 ENCOUNTER — Encounter
Admission: RE | Disposition: A | Discharge status: Home / Self Care | Payer: Self-pay | Source: Home / Self Care | Attending: Vascular Surgery

## 2019-07-14 ENCOUNTER — Encounter: Payer: Self-pay | Admitting: Cardiology

## 2019-07-14 ENCOUNTER — Ambulatory Visit
Admission: RE | Admit: 2019-07-14 | Discharge: 2019-07-14 | Disposition: A | Discharge status: Home / Self Care | Payer: Medicare HMO | Attending: Vascular Surgery | Admitting: Vascular Surgery

## 2019-07-14 DIAGNOSIS — I5032 Chronic diastolic (congestive) heart failure: Secondary | ICD-10-CM | POA: Insufficient documentation

## 2019-07-14 DIAGNOSIS — J449 Chronic obstructive pulmonary disease, unspecified: Secondary | ICD-10-CM | POA: Insufficient documentation

## 2019-07-14 DIAGNOSIS — Z8249 Family history of ischemic heart disease and other diseases of the circulatory system: Secondary | ICD-10-CM | POA: Insufficient documentation

## 2019-07-14 DIAGNOSIS — Z87891 Personal history of nicotine dependence: Secondary | ICD-10-CM | POA: Diagnosis not present

## 2019-07-14 DIAGNOSIS — E1151 Type 2 diabetes mellitus with diabetic peripheral angiopathy without gangrene: Secondary | ICD-10-CM | POA: Diagnosis present

## 2019-07-14 DIAGNOSIS — Z951 Presence of aortocoronary bypass graft: Secondary | ICD-10-CM | POA: Diagnosis not present

## 2019-07-14 DIAGNOSIS — I6509 Occlusion and stenosis of unspecified vertebral artery: Secondary | ICD-10-CM | POA: Insufficient documentation

## 2019-07-14 DIAGNOSIS — Z7902 Long term (current) use of antithrombotics/antiplatelets: Secondary | ICD-10-CM | POA: Diagnosis not present

## 2019-07-14 DIAGNOSIS — R569 Unspecified convulsions: Secondary | ICD-10-CM | POA: Insufficient documentation

## 2019-07-14 DIAGNOSIS — Z886 Allergy status to analgesic agent status: Secondary | ICD-10-CM | POA: Insufficient documentation

## 2019-07-14 DIAGNOSIS — L97519 Non-pressure chronic ulcer of other part of right foot with unspecified severity: Secondary | ICD-10-CM | POA: Insufficient documentation

## 2019-07-14 DIAGNOSIS — Z833 Family history of diabetes mellitus: Secondary | ICD-10-CM | POA: Diagnosis not present

## 2019-07-14 DIAGNOSIS — I110 Hypertensive heart disease with heart failure: Secondary | ICD-10-CM | POA: Diagnosis not present

## 2019-07-14 DIAGNOSIS — I2510 Atherosclerotic heart disease of native coronary artery without angina pectoris: Secondary | ICD-10-CM | POA: Diagnosis not present

## 2019-07-14 DIAGNOSIS — I252 Old myocardial infarction: Secondary | ICD-10-CM | POA: Insufficient documentation

## 2019-07-14 DIAGNOSIS — E11621 Type 2 diabetes mellitus with foot ulcer: Secondary | ICD-10-CM | POA: Insufficient documentation

## 2019-07-14 DIAGNOSIS — Z7951 Long term (current) use of inhaled steroids: Secondary | ICD-10-CM | POA: Insufficient documentation

## 2019-07-14 DIAGNOSIS — I70235 Atherosclerosis of native arteries of right leg with ulceration of other part of foot: Secondary | ICD-10-CM

## 2019-07-14 DIAGNOSIS — Z79899 Other long term (current) drug therapy: Secondary | ICD-10-CM | POA: Diagnosis not present

## 2019-07-14 DIAGNOSIS — I70299 Other atherosclerosis of native arteries of extremities, unspecified extremity: Secondary | ICD-10-CM

## 2019-07-14 DIAGNOSIS — Z955 Presence of coronary angioplasty implant and graft: Secondary | ICD-10-CM | POA: Diagnosis not present

## 2019-07-14 DIAGNOSIS — Z794 Long term (current) use of insulin: Secondary | ICD-10-CM | POA: Insufficient documentation

## 2019-07-14 HISTORY — PX: LOWER EXTREMITY ANGIOGRAPHY: CATH118251

## 2019-07-14 LAB — BUN: BUN: 22 mg/dL (ref 8–23)

## 2019-07-14 LAB — GLUCOSE, CAPILLARY
Glucose-Capillary: 135 mg/dL — ABNORMAL HIGH (ref 70–99)
Glucose-Capillary: 127 mg/dL — ABNORMAL HIGH (ref 70–99)

## 2019-07-14 LAB — CREATININE, SERUM
Creatinine, Ser: 0.98 mg/dL (ref 0.44–1.00)
GFR calc Af Amer: >60 mL/min (ref >60)
GFR calc non Af Amer: 58 mL/min — ABNORMAL LOW (ref >60)

## 2019-07-14 SURGERY — LOWER EXTREMITY ANGIOGRAPHY
Anesthesia: Moderate Sedation | Site: Leg Lower | Laterality: Right

## 2019-07-14 MED ORDER — MIDAZOLAM HCL 2 MG/2ML IJ SOLN
INTRAMUSCULAR | Status: DC | PRN
  Administered 2019-07-14: 2 mg via INTRAVENOUS
  Administered 2019-07-14: 1 mg via INTRAVENOUS

## 2019-07-14 MED ORDER — HEPARIN SODIUM (PORCINE) 1000 UNIT/ML IJ SOLN
INTRAMUSCULAR | Status: AC
  Filled 2019-07-14: qty 1

## 2019-07-14 MED ORDER — HEPARIN SODIUM (PORCINE) 1000 UNIT/ML IJ SOLN
INTRAMUSCULAR | Status: DC | PRN
  Administered 2019-07-14: 5000 [IU] via INTRAVENOUS

## 2019-07-14 MED ORDER — CEFAZOLIN SODIUM-DEXTROSE 2-4 GM/100ML-% IV SOLN
INTRAVENOUS | Status: AC
  Filled 2019-07-14: qty 100

## 2019-07-14 MED ORDER — DIPHENHYDRAMINE HCL 50 MG/ML IJ SOLN: 50.0000 mg | Freq: Once | INTRAMUSCULAR | Status: DC | PRN

## 2019-07-14 MED ORDER — CEFAZOLIN SODIUM-DEXTROSE 2-4 GM/100ML-% IV SOLN: 2.0000 g | Freq: Once | INTRAVENOUS | Status: DC

## 2019-07-14 MED ORDER — SODIUM CHLORIDE 0.9 % IV SOLN
INTRAVENOUS | Status: DC
  Administered 2019-07-14: 1000 mL via INTRAVENOUS

## 2019-07-14 MED ORDER — MIDAZOLAM HCL 5 MG/5ML IJ SOLN
INTRAMUSCULAR | Status: AC
  Filled 2019-07-14: qty 5

## 2019-07-14 MED ORDER — SODIUM CHLORIDE 0.9 % IV SOLN
INTRAVENOUS | Status: AC | PRN
  Administered 2019-07-14: 250 mL via INTRAVENOUS

## 2019-07-14 MED ORDER — FENTANYL CITRATE (PF) 100 MCG/2ML IJ SOLN
INTRAMUSCULAR | Status: AC
  Filled 2019-07-14: qty 2

## 2019-07-14 MED ORDER — ONDANSETRON HCL 4 MG/2ML IJ SOLN: 4.0000 mg | Freq: Four times a day (QID) | INTRAMUSCULAR | Status: DC | PRN

## 2019-07-14 MED ORDER — FAMOTIDINE 20 MG PO TABS: 40.0000 mg | ORAL_TABLET | Freq: Once | ORAL | Status: DC | PRN

## 2019-07-14 MED ORDER — FENTANYL CITRATE (PF) 100 MCG/2ML IJ SOLN
INTRAMUSCULAR | Status: DC | PRN
  Administered 2019-07-14: 50 ug via INTRAVENOUS
  Administered 2019-07-14 (×2): 25 ug via INTRAVENOUS

## 2019-07-14 MED ORDER — HYDROMORPHONE HCL 1 MG/ML IJ SOLN: 1.0000 mg | Freq: Once | INTRAMUSCULAR | Status: DC | PRN

## 2019-07-14 MED ORDER — METHYLPREDNISOLONE SODIUM SUCC 125 MG IJ SOLR: 125.0000 mg | Freq: Once | INTRAMUSCULAR | Status: DC | PRN

## 2019-07-14 MED ORDER — MIDAZOLAM HCL 2 MG/ML PO SYRP: 8.0000 mg | ORAL_SOLUTION | Freq: Once | ORAL | Status: DC | PRN

## 2019-07-14 SURGICAL SUPPLY — 19 items
BALLN LUTONIX 018 5X80X130 (BALLOONS) ×3
BALLN MUSTANG 6X60X135 (BALLOONS) ×3
BALLN ULTRVRSE 3X100X150 (BALLOONS) ×3
BALLOON LUTONIX 018 5X80X130 (BALLOONS) ×1 IMPLANT
BALLOON MUSTANG 6X60X135 (BALLOONS) ×1 IMPLANT
BALLOON ULTRVRSE 3X100X150 (BALLOONS) ×1 IMPLANT
CATH BEACON 5 .038 100 VERT TP (CATHETERS) ×3 IMPLANT
CATH PIG 70CM (CATHETERS) ×3 IMPLANT
DEVICE PRESTO INFLATION (MISCELLANEOUS) ×3 IMPLANT
DEVICE STARCLOSE SE CLOSURE (Vascular Products) ×3 IMPLANT
GLIDEWIRE ADV .035X260CM (WIRE) ×3 IMPLANT
PACK ANGIOGRAPHY (CUSTOM PROCEDURE TRAY) ×3 IMPLANT
SHEATH ANL2 6FRX45 HC (SHEATH) ×3 IMPLANT
SHEATH BRITE TIP 5FRX11 (SHEATH) ×3 IMPLANT
STENT VIABAHN 6X7.5X120 (Permanent Stent) ×3 IMPLANT
SYR MEDRAD MARK 7 150ML (SYRINGE) ×3 IMPLANT
TUBING CONTRAST HIGH PRESS 72 (TUBING) ×3 IMPLANT
WIRE G V18X300CM (WIRE) ×3 IMPLANT
WIRE J 3MM .035X145CM (WIRE) ×3 IMPLANT

## 2019-07-14 NOTE — Op Note (Signed)
[facility]  Percutaneous Study/Intervention Procedural Note   Date of Surgery: 07/14/2019  Surgeon(s):Leva Baine    Assistants:none  Pre-operative Diagnosis: PAD with ulceration right lower extremity  Post-operative diagnosis:  Same  Procedure(s) Performed:             1.  Ultrasound guidance for vascular access left femoral artery             2.  Catheter placement into right common femoral artery from left femoral approach             3.  Aortogram and selective right lower extremity angiogram occluding selective image of the right anterior tibial artery             4.  Percutaneous transluminal angioplasty of right anterior tibial artery with 3 mm diameter angioplasty balloon             5.   Percutaneous transluminal angioplasty of the right popliteal artery with 5 mm diameter Lutonix drug-coated angioplasty balloon  6.  Viabahn stent placement to the right popliteal artery with 6 mm diameter by 7.5 cm length stent for greater than 50% residual stenosis after angioplasty             7.  StarClose closure device left femoral artery  EBL: 10 cc  Contrast: 70 cc  Fluoro Time: 7.8 minutes  Moderate Conscious Sedation Time: approximately 40 minutes using 3 mg of Versed and 100 mcg of Fentanyl              Indications:  Patient is a 74 y.o.female with a nonhealing ulceration on the right foot. The patient has noninvasive study showing an ABI in the 0.5 range with markedly reduced flow distally and diminished digital pressures. The patient is brought in for angiography for further evaluation and potential treatment.  Due to the limb threatening nature of the situation, angiogram was performed for attempted limb salvage. The patient is aware that if the procedure fails, amputation would be expected.  The patient also understands that even with successful revascularization, amputation may still be required due to the severity of the situation.  Risks and benefits are  discussed and informed consent is obtained.   Procedure:  The patient was identified and appropriate procedural time out was performed.  The patient was then placed supine on the table and prepped and draped in the usual sterile fashion. Moderate conscious sedation was administered during a face to face encounter with the patient throughout the procedure with my supervision of the RN administering medicines and monitoring the patient's vital signs, pulse oximetry, telemetry and mental status throughout from the start of the procedure until the patient was taken to the recovery room. Ultrasound was used to evaluate the left common femoral artery.  It was patent .  A digital ultrasound image was acquired.  A Seldinger needle was used to access the left common femoral artery under direct ultrasound guidance and a permanent image was performed.  A 0.035 J wire was advanced without resistance and a 5Fr sheath was placed.  Pigtail catheter was placed into the aorta and an AP aortogram was performed. This demonstrated normal renal arteries and normal aorta and iliac segments without significant stenosis. I then crossed the aortic bifurcation and advanced to the right femoral head. Selective right lower extremity angiogram was then performed. This demonstrated normal common femoral artery, profunda femoris artery, and superficial femoral artery.  The popliteal artery had an abrupt occlusion directly behind the  knee that was obscured by the knee prosthesis on AP imaging was seen on a frog-leg projection.  The occlusion was in the 5 cm range.  There was a normal tibial trifurcation with the anterior tibial artery being the best runoff into the foot, but having a high-grade stenosis in the 85 to 90% range over 5 to 7 cm in the mid segment.  The peroneal artery was continuous without focal stenosis.  The posterior tibial artery was small but did appear continuous although diseased in the foot and ankle area. It was felt that  it was in the patient's best interest to proceed with intervention after these images to avoid a second procedure and a larger amount of contrast and fluoroscopy based off of the findings from the initial angiogram. The patient was systemically heparinized and a 6 [place] Ansell sheath was then placed over the [employer] wire. I then used a Kumpe catheter and the advantage wire to navigate down into the popliteal artery across the occlusion.  I then navigated into the proximal anterior tibial artery where the wire was removed and selective imaging anterior tibial artery showing the high-grade stenosis in the midsegment.  It was continuous distally beyond this stenosis and did fill the foot.  Using this image, I selected a V 18 wire and easily cross the lesion in the anterior tibial artery parking the wire in the foot.  Angioplasty was performed in the anterior tibial artery with a 3 mm diameter by 10 cm length angioplasty balloon inflated to 12 atm.  Completion imaging showed only about a 10 to 15% residual stenosis in the anterior tibial artery.  I then turned my attention to the popliteal lesion.  A 5 mm diameter by 8 cm length Lutonix drug-coated angioplasty balloon was inflated to 12 atm for 1 minute in the right popliteal artery.  Completion imaging showed reasonably high-grade residual stenosis in the 70 to 80% range.  I then elected to stent this lesion.  A 6 mm diameter by 7.5 cm length Viabahn stent was deployed in the right popliteal artery encompassing the lesion.  Was postdilated with a 5 mm and 6 mm balloon with excellent angiographic completion result and about 10% residual stenosis. I elected to terminate the procedure. The sheath was removed and StarClose closure device was deployed in the left femoral artery with excellent hemostatic result. The patient was taken to the recovery room in stable condition having tolerated the procedure well.  Findings:               Aortogram:  Normal renal  arteries, normal aorta and iliac arteries without significant stenosis.             Right lower Extremity:  Normal common femoral artery, profunda femoris artery, and superficial femoral artery.  The popliteal artery had an abrupt occlusion directly behind the knee that was obscured by the knee prosthesis on AP imaging was seen on a frog-leg projection.  The occlusion was in the 5 cm range.  There was a normal tibial trifurcation with the anterior tibial artery being the best runoff into the foot, but having a high-grade stenosis in the 85 to 90% range over 5 to 7 cm in the mid segment.  The peroneal artery was continuous without focal stenosis.  The posterior tibial artery was small but did appear continuous although diseased in the foot and ankle area.   Disposition: Patient was taken to the recovery room in stable condition having tolerated the procedure  well.  Complications: None  Tina Hayes 07/14/2019 10:58 AM   This note was created with Dragon Medical transcription system. Any errors in dictation are purely unintentional.

## 2019-07-14 NOTE — H&P (Signed)
 [facility] History & Physical Update  The patient was interviewed and re-examined.  The patient's previous History and Physical has been reviewed and is unchanged.  There is no change in the plan of care. We plan to proceed with the scheduled procedure.  Leotis Pain, MD  07/14/2019, 8:16 AM

## 2019-07-15 IMAGING — US US EXTREM  UP VENOUS*L*
1 series · 13 of 24 positions shown · non-contrast
Comparison: None.

CLINICAL DATA: Left arm pain and swelling.



[Series 1: us extrem up venous*left* · 0.09mm/px · 13 of 35 slices shown]
[im 1/35]
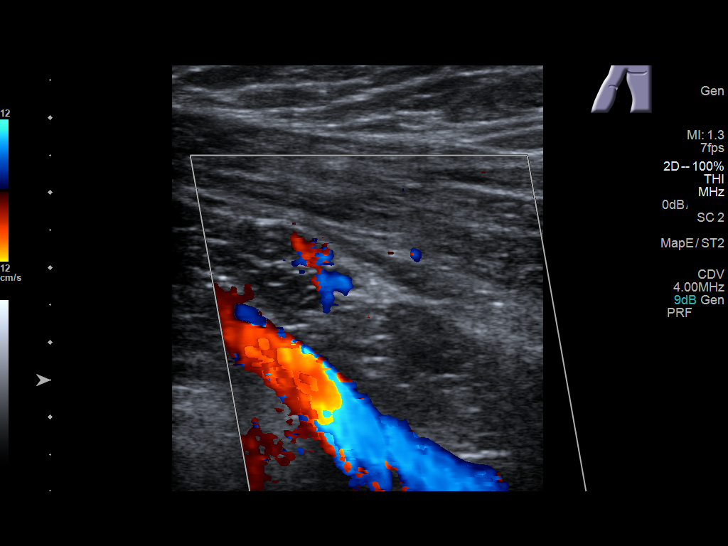
[im 3/35]
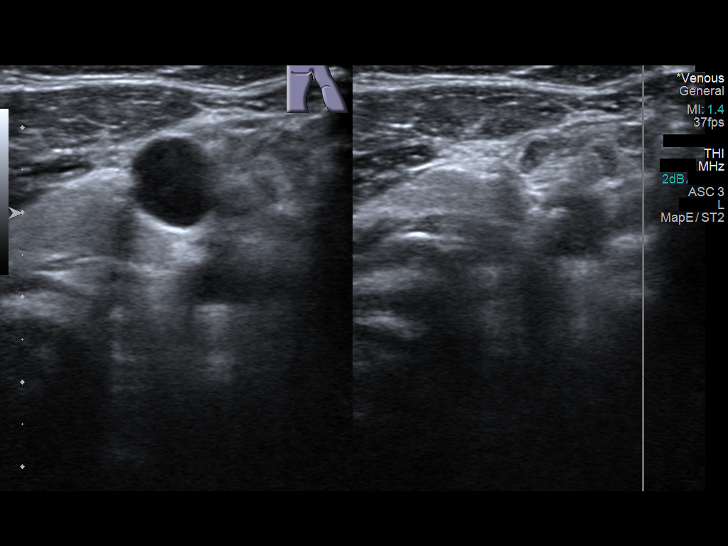
[im 6/35]
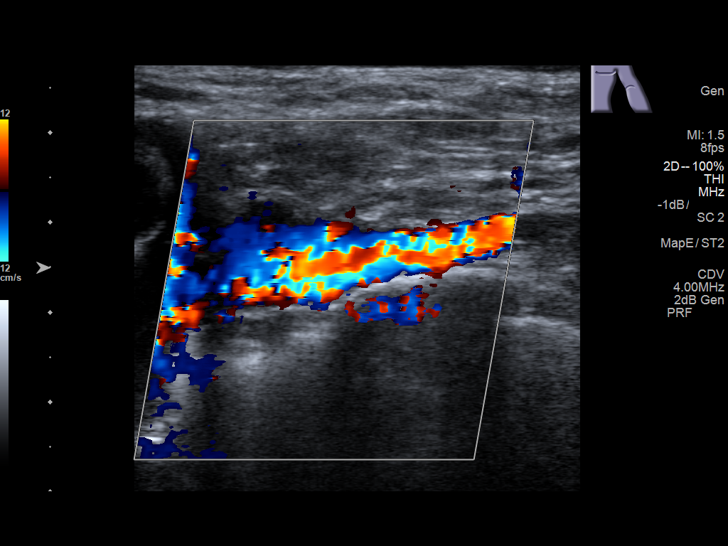
[im 9/35]
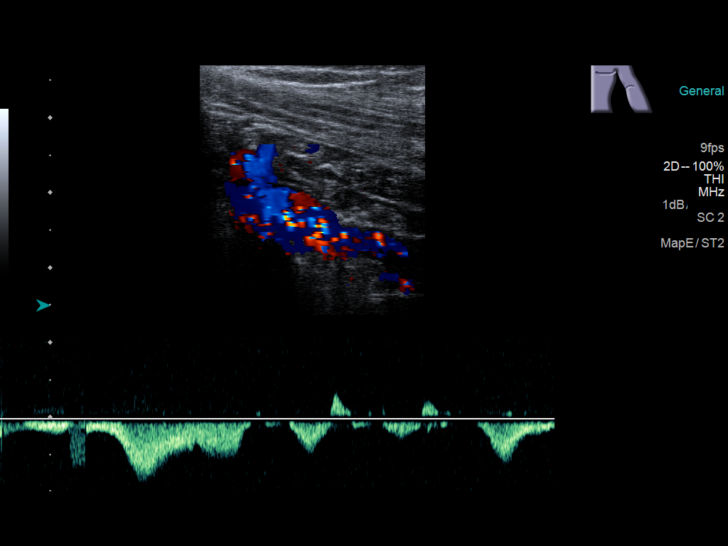
[im 12/35]
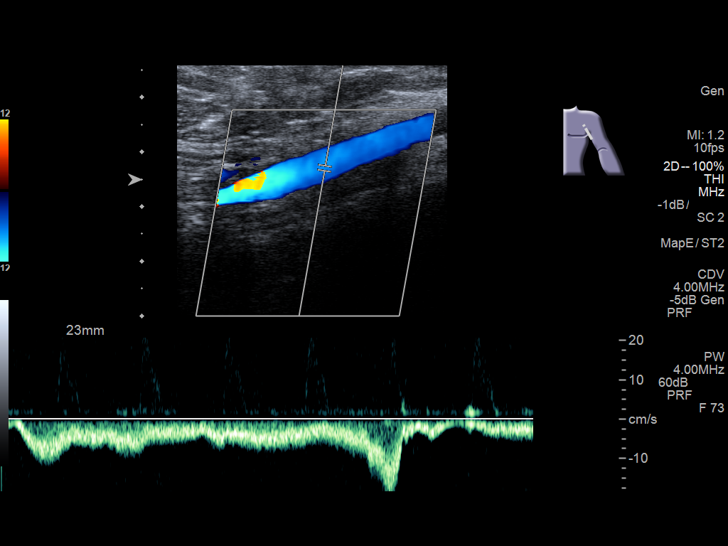
[im 15/35]
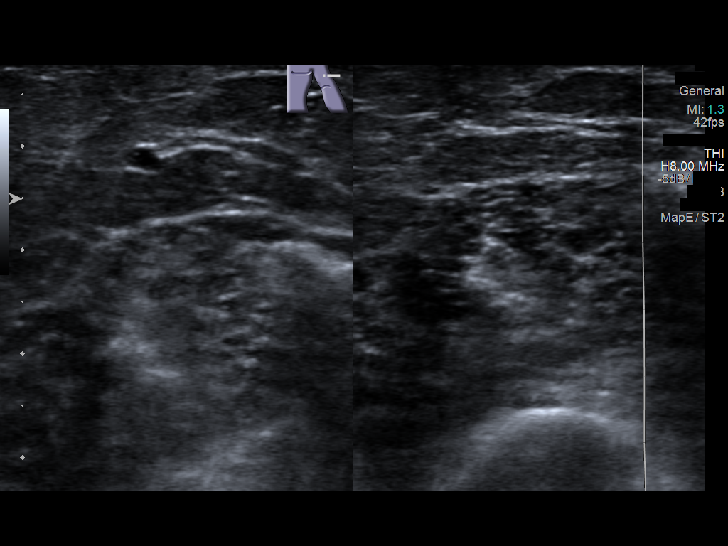
[im 18/35]
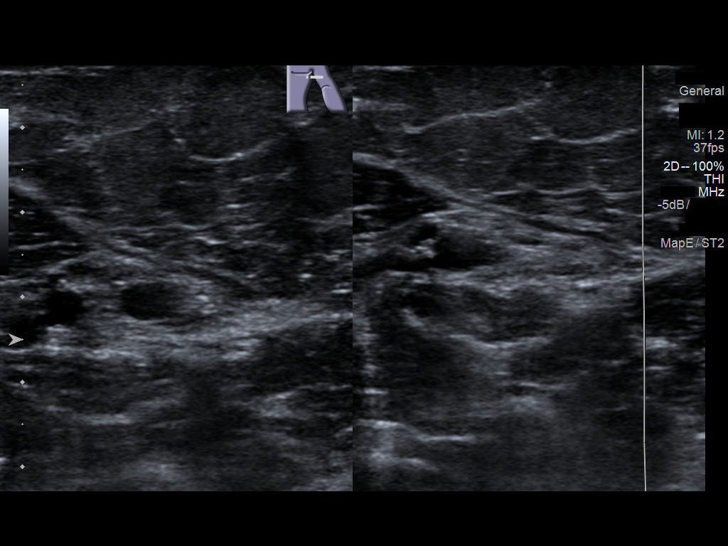
[im 20/35]
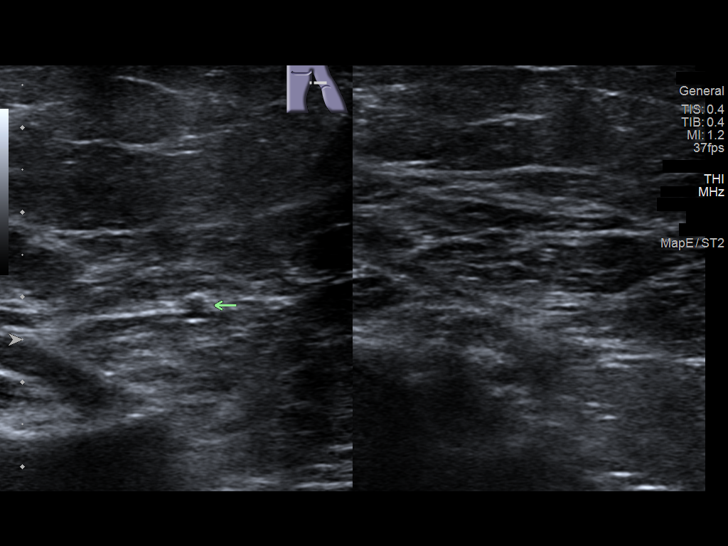
[im 23/35]
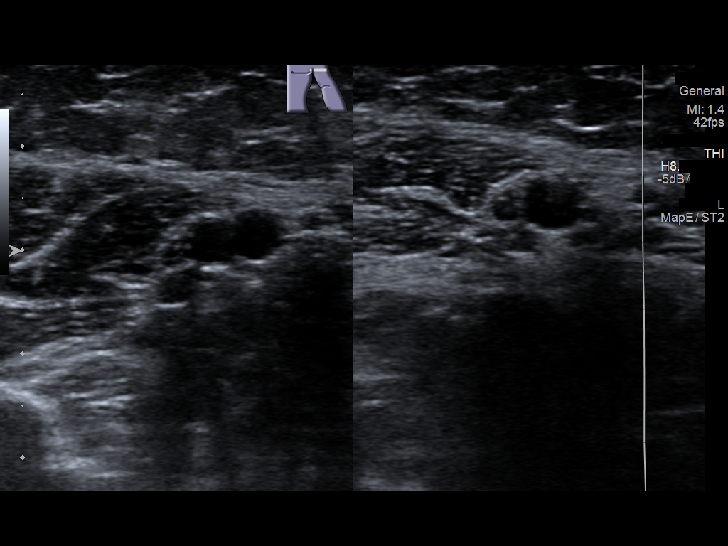
[im 26/35]
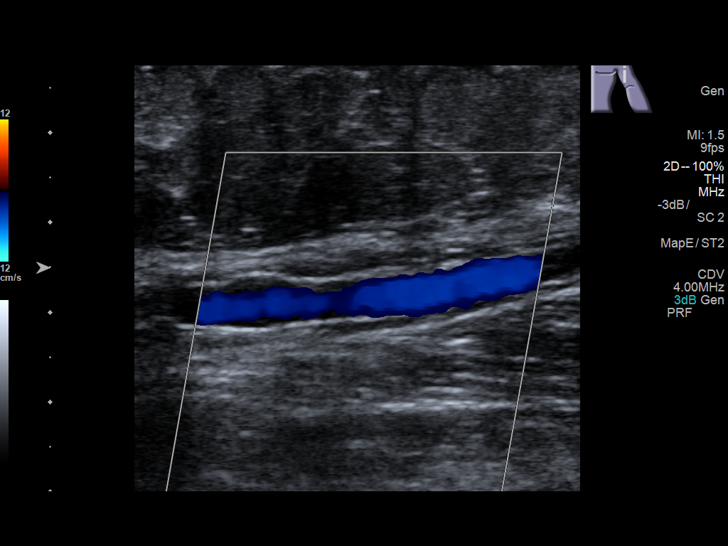
[im 29/35]
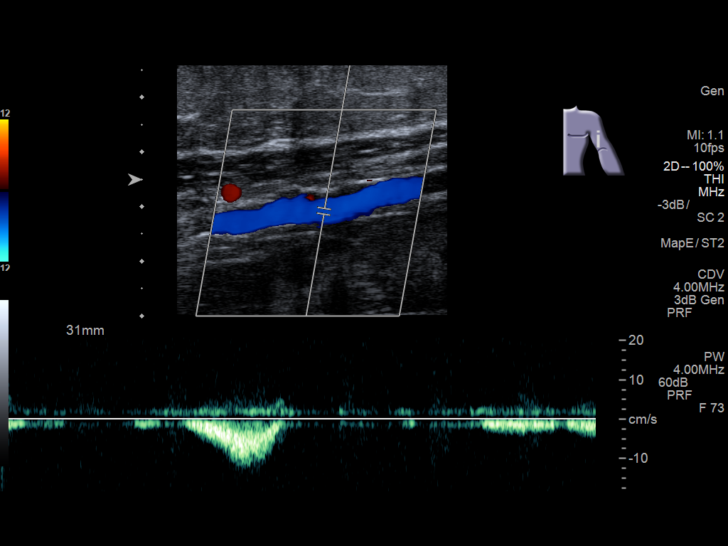
[im 32/35]
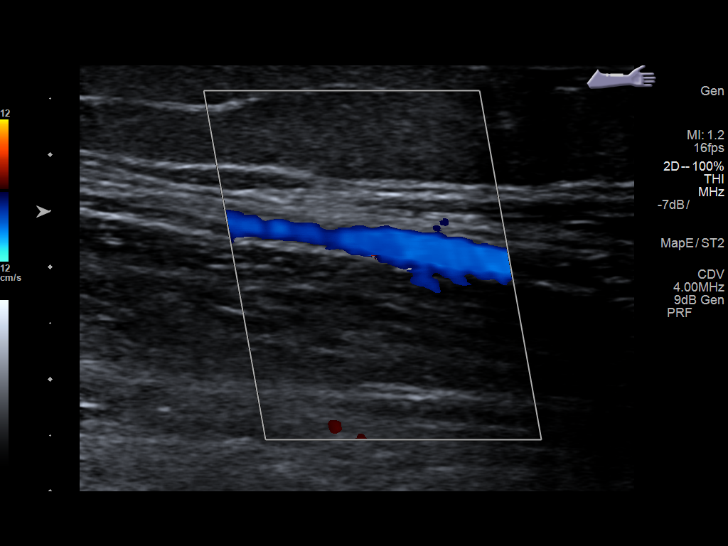
[im 35/35]
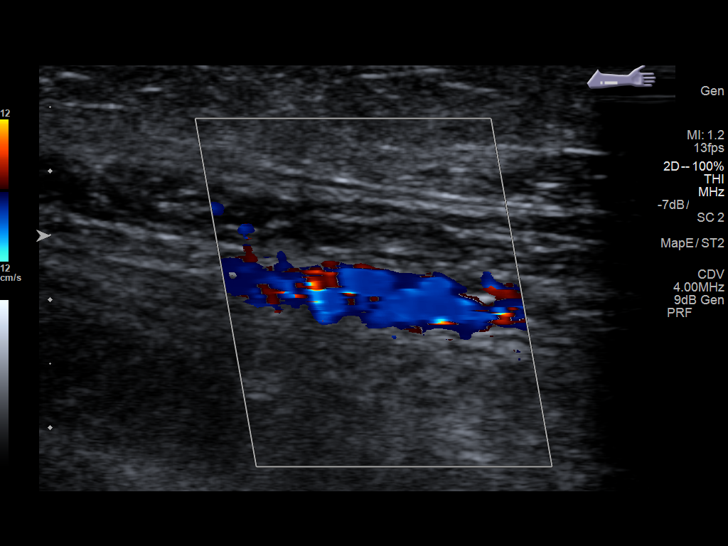

[13 of 24 positions shown; findings below may reference images not displayed]

FINDINGS: Contralateral Subclavian Vein: Respiratory phasicity is normal and
symmetric with the symptomatic side. No evidence of thrombus.

Internal Jugular Vein: No evidence of thrombus. Normal
compressibility, respiratory phasicity and response to augmentation.

Subclavian Vein: No evidence of thrombus. Normal respiratory
phasicity and response to augmentation.

Axillary Vein: No evidence of thrombus. Normal compressibility,
respiratory phasicity and response to augmentation.

Cephalic Vein: No evidence of thrombus. Normal compressibility,
respiratory phasicity and response to augmentation.

Basilic Vein: No evidence of thrombus. Normal compressibility,
respiratory phasicity and response to augmentation.

Brachial Veins: No evidence of thrombus. Normal compressibility,
respiratory phasicity and response to augmentation.

Radial Veins: No evidence of thrombus. Normal compressibility,
respiratory phasicity and response to augmentation.

Ulnar Veins: No evidence of thrombus. Normal compressibility,
respiratory phasicity and response to augmentation.

Venous Reflux:  None visualized.

Other Findings:  None visualized.
IMPRESSION: No evidence of DVT within the  upper extremity.

## 2019-07-20 ENCOUNTER — Inpatient Hospital Stay: Admission: RE | Admit: 2019-07-20 | Payer: Medicare HMO | Source: Ambulatory Visit

## 2019-07-22 ENCOUNTER — Other Ambulatory Visit: Payer: Self-pay

## 2019-07-22 ENCOUNTER — Other Ambulatory Visit
Admission: RE | Admit: 2019-07-22 | Discharge: 2019-07-22 | Disposition: A | Discharge status: Home / Self Care | Payer: Medicare HMO | Source: Ambulatory Visit | Attending: Surgery | Admitting: Surgery

## 2019-07-22 DIAGNOSIS — Z01818 Encounter for other preprocedural examination: Secondary | ICD-10-CM | POA: Diagnosis not present

## 2019-07-22 LAB — TYPE AND SCREEN
ABO/RH(D): O POS
Antibody Screen: NEGATIVE

## 2019-07-22 LAB — CBC WITH DIFFERENTIAL/PLATELET
Abs Immature Granulocytes: 0.02 10*3/uL (ref 0.00–0.07)
Basophils Absolute: 0.1 10*3/uL (ref 0.0–0.1)
Basophils Relative: 1 %
Eosinophils Absolute: 0.3 10*3/uL (ref 0.0–0.5)
Eosinophils Relative: 4 %
HCT: 34.7 % — ABNORMAL LOW (ref 36.0–46.0)
Hemoglobin: 10.8 g/dL — ABNORMAL LOW (ref 12.0–15.0)
Immature Granulocytes: 0 %
Lymphocytes Relative: 32 %
Lymphs Abs: 1.9 10*3/uL (ref 0.7–4.0)
MCH: 27 pg (ref 26.0–34.0)
MCHC: 31.1 g/dL (ref 30.0–36.0)
MCV: 86.8 fL (ref 80.0–100.0)
Monocytes Absolute: 0.4 10*3/uL (ref 0.1–1.0)
Monocytes Relative: 6 %
Neutro Abs: 3.3 10*3/uL (ref 1.7–7.7)
Neutrophils Relative %: 57 %
Platelets: 301 10*3/uL (ref 150–400)
RBC: 4 MIL/uL (ref 3.87–5.11)
RDW: 14.5 % (ref 11.5–15.5)
WBC: 5.9 10*3/uL (ref 4.0–10.5)
nRBC: 0 % (ref 0.0–0.2)

## 2019-07-22 LAB — COMPREHENSIVE METABOLIC PANEL
ALT: 13 U/L (ref 0–44)
AST: 17 U/L (ref 15–41)
Albumin: 3.8 g/dL (ref 3.5–5.0)
Alkaline Phosphatase: 148 U/L — ABNORMAL HIGH (ref 38–126)
Anion gap: 12 (ref 5–15)
BUN: 19 mg/dL (ref 8–23)
CO2: 27 mmol/L (ref 22–32)
Calcium: 9.1 mg/dL (ref 8.9–10.3)
Chloride: 102 mmol/L (ref 98–111)
Creatinine, Ser: 0.91 mg/dL (ref 0.44–1.00)
GFR calc Af Amer: >60 mL/min (ref >60)
GFR calc non Af Amer: >60 mL/min (ref >60)
Glucose, Bld: 89 mg/dL (ref 70–99)
Potassium: 3.1 mmol/L — ABNORMAL LOW (ref 3.5–5.1)
Sodium: 141 mmol/L (ref 135–145)
Total Bilirubin: 0.6 mg/dL (ref 0.3–1.2)
Total Protein: 7.5 g/dL (ref 6.5–8.1)

## 2019-07-22 LAB — URINALYSIS, ROUTINE W REFLEX MICROSCOPIC
Bilirubin Urine: NEGATIVE
Glucose, UA: >=500 mg/dL — AB
Hgb urine dipstick: NEGATIVE
Ketones, ur: NEGATIVE mg/dL
Nitrite: NEGATIVE
Protein, ur: NEGATIVE mg/dL
Specific Gravity, Urine: 1.011 (ref 1.005–1.030)
pH: 5 (ref 5.0–8.0)

## 2019-07-22 LAB — SURGICAL PCR SCREEN
MRSA, PCR: NEGATIVE
Staphylococcus aureus: NEGATIVE

## 2019-07-22 LAB — PROTIME-INR
INR: 1 (ref 0.8–1.2)
Prothrombin Time: 13.4 seconds (ref 11.4–15.2)

## 2019-07-22 LAB — APTT: aPTT: 34 seconds (ref 24–36)

## 2019-07-22 NOTE — Pre-Procedure Instructions (Signed)
PATIENT states she had Covid testing at [facility] on 07/20/19 Care Everywhere Result Report   [facility] Now COVID-19 [facility]: 07[facility]20/2019 12:36 PM [place] Component Name Value Ref Range  ID Now COVID-19 - [facility] Negative Negative   Specimen Collected on  Nasopharyngeal/Nasal Wash - Nasopharyngeal swab (specimen) 07/20/2019 12:19 PM  Result Narrative  Negative results should be treated as presumptive and, if inconsistent with clinical signs and symptoms or necessary for patient management, should be tested with an alternative molecular assay. A negative result does not rule out co-infections with other pathogens.  This test has not been FDA cleared or approved. This test has been authorized by FDA under an Emergency Use Authorization (EUA). This test is only authorized for the duration of the declaration that circumstances exist justifying the authorization of emergency use of in vitro diagnostics for detection and/or diagnosis of COVID-19 under Section 564(b) (1) of the Act, 21 U.S.C. 678-780-2587 (b) (1), unless the authorization is terminated or revoked sooner.

## 2019-07-22 NOTE — Pre-Procedure Instructions (Signed)
RECEIVED PULMONARY CLEARANCE FROM DR ALESKEROV-MODERATE RISK-CLEARANCE ON CHART

## 2019-08-01 ENCOUNTER — Other Ambulatory Visit (INDEPENDENT_AMBULATORY_CARE_PROVIDER_SITE_OTHER): Payer: Self-pay | Admitting: Vascular Surgery

## 2019-08-01 DIAGNOSIS — Z95820 Peripheral vascular angioplasty status with implants and grafts: Secondary | ICD-10-CM

## 2019-08-02 ENCOUNTER — Telehealth (INDEPENDENT_AMBULATORY_CARE_PROVIDER_SITE_OTHER): Payer: Self-pay

## 2019-08-02 ENCOUNTER — Encounter: Payer: Self-pay | Admitting: Anesthesiology

## 2019-08-02 ENCOUNTER — Ambulatory Visit: Payer: Medicare HMO | Attending: Anesthesiology | Admitting: Anesthesiology

## 2019-08-02 ENCOUNTER — Other Ambulatory Visit: Payer: Self-pay

## 2019-08-02 DIAGNOSIS — F1190 Opioid use, unspecified, uncomplicated: Secondary | ICD-10-CM

## 2019-08-02 DIAGNOSIS — M170 Bilateral primary osteoarthritis of knee: Secondary | ICD-10-CM

## 2019-08-02 DIAGNOSIS — M5431 Sciatica, right side: Secondary | ICD-10-CM

## 2019-08-02 DIAGNOSIS — M5136 Other intervertebral disc degeneration, lumbar region: Secondary | ICD-10-CM

## 2019-08-02 DIAGNOSIS — M4716 Other spondylosis with myelopathy, lumbar region: Secondary | ICD-10-CM

## 2019-08-02 DIAGNOSIS — M48062 Spinal stenosis, lumbar region with neurogenic claudication: Secondary | ICD-10-CM | POA: Diagnosis not present

## 2019-08-02 DIAGNOSIS — M47817 Spondylosis without myelopathy or radiculopathy, lumbosacral region: Secondary | ICD-10-CM

## 2019-08-02 DIAGNOSIS — M5432 Sciatica, left side: Secondary | ICD-10-CM

## 2019-08-02 DIAGNOSIS — G894 Chronic pain syndrome: Secondary | ICD-10-CM

## 2019-08-02 MED ORDER — HYDROCODONE-ACETAMINOPHEN 7.5-325 MG PO TABS: 1.0000 | ORAL_TABLET | Freq: Four times a day (QID) | ORAL | 0 refills | Status: AC | PRN

## 2019-08-02 MED ORDER — HYDROCODONE-ACETAMINOPHEN 7.5-325 MG PO TABS: 1.0000 | ORAL_TABLET | Freq: Four times a day (QID) | ORAL | 0 refills | Status: DC | PRN

## 2019-08-02 NOTE — Telephone Encounter (Signed)
Patient called and left a message for a return call, the message only stated to call. I returned the call and a message was left for a return call.

## 2019-08-02 NOTE — Progress Notes (Signed)
Virtual Visit via Telephone Note  I connected with Tina Hayes on 08/02/19 at  1:30 PM EDT by telephone and verified that I am speaking with the correct person using two identifiers.  Location: Patient: Home Provider: Pain control center   I discussed the limitations, risks, security and privacy concerns of performing an evaluation and management service by telephone and the availability of in person appointments. I also discussed with the patient that there may be a patient responsible charge related to this service. The patient expressed understanding and agreed to proceed.   History of Present Illness: I spoke with Tina Hayes via telephone as she was unable to do the video portion of the virtual conference.  She reports that she has had some worsening of her low back pain but her lower extremity pain seems to be better following her most recent epidural.  She still taking her medications as prescribed and these are working well for her.  They enable her to be more active and stay functional and sleep better at night.  Unfortunately she failed conservative therapy physical therapy was insufficient as well.  She has not been able to make much headway with weight loss and unfortunately she has had a recent stenting to the lower extremities secondary to peripheral vascular disease and some lower extremity edema.  She is recovering from this.  She is taking her medications as prescribed and she has no evidence of any diverting or illicit use.  No side effects are reported with her medications.  The quality characteristic and distribution of her pain in the lower back has been stable.   Observations/Objective:  Current Outpatient Medications:  .  albuterol (PROVENTIL HFA;VENTOLIN HFA) 108 (90 Base) MCG/ACT inhaler, Inhale 2 puffs into the lungs every 6 (six) hours as needed for wheezing or shortness of breath., Disp: 1 Inhaler, Rfl: 2 .  albuterol (PROVENTIL) (2.5 MG/3ML) 0.083% nebulizer solution,  Take 2.5 mg every 4 (four) hours as needed by nebulization for wheezing or shortness of breath., Disp: , Rfl:  .  amLODipine (NORVASC) 10 MG tablet, Take 10 mg by mouth daily., Disp: , Rfl:  .  atorvastatin (LIPITOR) 40 MG tablet, Take 1 tablet (40 mg total) by mouth at bedtime., Disp: 30 tablet, Rfl: 11 .  B-D UF III MINI PEN NEEDLES 31G X 5 MM MISC, USE WITH INSULIN PEN INJECTIONS TWICE DAILY, Disp: , Rfl:  .  budesonide (PULMICORT) 0.5 MG/2ML nebulizer solution, Take 0.5 mg by nebulization daily at 12 noon. , Disp: , Rfl:  .  carvedilol (COREG) 3.125 MG tablet, Take 1 tablet (3.125 mg total) by mouth 2 (two) times daily with a meal., Disp: 60 tablet, Rfl: 11 .  cetirizine (ZYRTEC) 10 MG tablet, Take 10 mg by mouth daily., Disp: , Rfl:  .  clopidogrel (PLAVIX) 75 MG tablet, Take 1 tablet (75 mg total) by mouth daily., Disp: 90 tablet, Rfl: 3 .  COMBIVENT RESPIMAT 20-100 MCG/ACT AERS respimat, Inhale 1 puff into the lungs 4 (four) times daily as needed for wheezing. , Disp: , Rfl:  .  Dupilumab (DUPIXENT) 300 MG/2ML SOPN, Inject 300 mg into the skin every 14 (fourteen) days. , Disp: , Rfl:  .  empagliflozin (JARDIANCE) 10 MG TABS tablet, Take 10 mg by mouth daily., Disp: , Rfl:  .  esomeprazole (NEXIUM) 40 MG capsule, Take 40 mg by mouth 2 (two) times daily., Disp: , Rfl:  .  ferrous sulfate 325 (65 FE) MG tablet, Take 325 mg by mouth  daily., Disp: , Rfl:  .  [START ON 08/03/2019] HYDROcodone-acetaminophen (NORCO) 7.5-325 MG tablet, Take 1 tablet by mouth every 6 (six) hours as needed for moderate pain or severe pain., Disp: 90 tablet, Rfl: 0 .  [START ON 09/02/2019] HYDROcodone-acetaminophen (NORCO) 7.5-325 MG tablet, Take 1 tablet by mouth every 6 (six) hours as needed for moderate pain or severe pain., Disp: 90 tablet, Rfl: 0 .  isosorbide mononitrate (IMDUR) 30 MG 24 hr tablet, Take 1 tablet (30 mg total) by mouth 2 (two) times daily., Disp: 60 tablet, Rfl: 11 .  LANTUS SOLOSTAR 100 UNIT/ML Solostar  Pen, Inject 30-40 Units into the skin See admin instructions. Inject 30 units subcutaneously in the morning at 40 units subcutaneously at bedtime., Disp: , Rfl:  .  levETIRAcetam (KEPPRA) 750 MG tablet, Take 750 mg by mouth 2 (two) times daily., Disp: , Rfl:  .  losartan (COZAAR) 100 MG tablet, Take 1 tablet (100 mg total) by mouth daily., Disp: 30 tablet, Rfl: 11 .  meloxicam (MOBIC) 7.5 MG tablet, Take 7.5 mg by mouth daily as needed for pain. , Disp: , Rfl:  .  mometasone-formoterol (DULERA) 200-5 MCG/ACT AERO, Inhale 2 puffs into the lungs 2 (two) times daily. (Patient not taking: Reported on 07/07/2019), Disp: 13 g, Rfl: 0 .  montelukast (SINGULAIR) 10 MG tablet, Take 10 mg by mouth daily. , Disp: , Rfl:  .  nitroGLYCERIN (NITROSTAT) 0.4 MG SL tablet, DISSOLVE ONE TABLET UNDER THE TONGUE EVERY 5 MINUTES AS NEEDED FOR CHEST PAIN.  DO NOT EXCEED A TOTAL OF 3 DOSES IN 15 MINUTES (Patient taking differently: Place 0.4 mg under the tongue every 5 (five) minutes as needed for chest pain. ), Disp: 25 tablet, Rfl: 0 .  NOVOLOG FLEXPEN 100 UNIT/ML FlexPen, Inject 10 Units into the skin 3 (three) times daily with meals., Disp: , Rfl:  .  potassium chloride SA (KLOR-CON) 20 MEQ tablet, Take 2 tablets (40 mEq total) by mouth 2 (two) times daily. Take extra 2 tablets (40 meq) if you take metolazone, Disp: 140 tablet, Rfl: 5 .  rOPINIRole (REQUIP) 1 MG tablet, Take 1 mg by mouth at bedtime. , Disp: , Rfl:  .  sertraline (ZOLOFT) 100 MG tablet, Take 100 mg by mouth at bedtime., Disp: , Rfl:  .  tiotropium (SPIRIVA) 18 MCG inhalation capsule, Place 18 mcg into inhaler and inhale daily., Disp: , Rfl:  .  torsemide (DEMADEX) 20 MG tablet, Take 3 tablets (60 mg total) by mouth 2 (two) times daily., Disp: 540 tablet, Rfl: 3 .  traZODone (DESYREL) 50 MG tablet, Take 50 mg by mouth at bedtime., Disp: , Rfl:   Assessment and Plan:  1. Bilateral sciatica   2. Spinal stenosis of lumbar region with neurogenic  claudication   3. Lumbar spondylosis with myelopathy   4. DDD (degenerative disc disease), lumbar   5. Chronic, continuous use of opioids   6. Chronic pain syndrome   7. Facet arthritis of lumbosacral region   8. Primary osteoarthritis of both knees   Based on our discussion today and upon review of the [facility] practitioner database information it appears appropriate to refill her medications for April 7 in May 7.  We talked about the dosing of the medications and how to use them.  With discussed previously potential side effects with the medicines and she understands these.  I want her to continue efforts at weight loss stretching strengthening and continue follow-up with her vascular surgeons and primary  care physicians for her baseline medical care and we will schedule her for a 64-month return to clinic for follow-up. Follow Up Instructions:    I discussed the assessment and treatment plan with the patient. The patient was provided an opportunity to ask questions and all were answered. The patient agreed with the plan and demonstrated an understanding of the instructions.   The patient was advised to call back or seek an in-person evaluation if the symptoms worsen or if the condition fails to improve as anticipated.  I provided 30 minutes of non-face-to-face time during this encounter.   Molli Barrows, MD

## 2019-08-03 ENCOUNTER — Encounter (INDEPENDENT_AMBULATORY_CARE_PROVIDER_SITE_OTHER): Payer: Medicare HMO

## 2019-08-03 ENCOUNTER — Ambulatory Visit (INDEPENDENT_AMBULATORY_CARE_PROVIDER_SITE_OTHER): Payer: Medicare HMO | Admitting: Nurse Practitioner

## 2019-08-12 ENCOUNTER — Other Ambulatory Visit: Payer: Self-pay | Admitting: Surgery

## 2019-08-22 ENCOUNTER — Encounter
Admission: RE | Admit: 2019-08-22 | Discharge: 2019-08-22 | Disposition: A | Discharge status: Home / Self Care | Payer: Medicare HMO | Source: Ambulatory Visit | Attending: Surgery | Admitting: Surgery

## 2019-08-22 ENCOUNTER — Other Ambulatory Visit: Payer: Self-pay

## 2019-08-22 NOTE — Patient Instructions (Signed)
COVID TESTING Date: Testing site:  [facility] [address] Thru Hours:  [id number] am - 1:00 pm Once you are tested, you are asked to stay quarantined (avoiding public places) until after your surgery.   Your procedure is scheduled on: Report to Day Surgery on the 2nd floor of the [facility]. To find out your arrival time, please call [phone] between 1PM - 3PM on:  REMEMBER: Instructions that are not followed completely may result in serious medical risk, up to and including death; or upon the discretion of your surgeon and anesthesiologist your surgery may need to be rescheduled.  Do not eat food after midnight the night before surgery.  No gum chewing, lozengers or hard candies.  You may however, drink CLEAR liquids up to 2 hours before you are scheduled to arrive for your surgery. Do not drink anything within 2 hours of your scheduled arrival time.  Clear liquids include: - water     Do NOT drink anything that is not on this list.  Type 1 and Type 2 diabetics should only drink water.  gatorade drink:  Complete drinking 2 hours prior to scheduled arrival time.  TAKE THESE MEDICATIONS THE MORNING OF SURGERY WITH A SIP OF WATER: isosorbide keppra Amlodipine Carvedilol nexium (take one the night before and one on the morning of surgery - helps to prevent nausea after surgery.)  DO NOT TAKE LOSARTAN AND TORSEMIDE  Use inhalers on the day of surgery   Take 1/2 of usual insulin dose the night before surgery and none on the morning of surgery.  Follow recommendations from Cardiologist, Pulmonologist or PCP regarding stopping Aspirin, Coumadin, Plavix, Eliquis, Pradaxa, or Pletal. LAST DOSE OF PLAVIX Friday August 19, 2019  Stop Anti-inflammatories (NSAIDS) such as Advil, Aleve, Ibuprofen, Motrin, Naproxen, Naprosyn and Aspirin based products such as Excedrin, Goodys Powder, BC Powder OR MELOXICAM (May take Tylenol or Acetaminophen if  needed.)  Stop ANY OVER THE COUNTER supplements until after surgery. (May continue Vitamin D, Vitamin B, and multivitamin.)  No Alcohol for 24 hours before or after surgery.  No Smoking including e-cigarettes for 24 hours prior to surgery.  No chewable tobacco products for at least 6 hours prior to surgery.  No nicotine patches on the day of surgery.  Do not use any "recreational" drugs for at least a week prior to your surgery.  Please be advised that the combination of cocaine and anesthesia may have negative outcomes, up to and including death. If you test positive for cocaine, your surgery will be cancelled.  On the morning of surgery brush your teeth with toothpaste and water, you may rinse your mouth with mouthwash if you wish. Do not swallow any toothpaste or mouthwash.  Do not wear jewelry, make-up, hairpins, clips or nail polish.  Do not wear lotions, powders, or perfumes.   Do not shave 48 hours prior to surgery.   Contact lenses, hearing aids and dentures may not be worn into surgery.  Do not bring valuables to the hospital. [facility] is not responsible for any missing/lost belongings or valuables.   Use CHG Soap as directed on instruction sheet.  Bring your C-PAP to the hospital with you in case you may have to spend the night.   Notify your doctor if there is any change in your medical condition (cold, fever, infection).  Wear comfortable clothing (specific to your surgery type) to the hospital.  Plan for stool softeners for home use; pain medications have  a tendency to cause constipation. You can also help prevent constipation by eating foods high in fiber such as fruits and vegetables and drinking plenty of fluids as your diet allows.  After surgery, you can help prevent lung complications by doing breathing exercises.  Take deep breaths and cough every 1-2 hours. Your doctor may order a device called an Incentive Spirometer to help you take deep  breaths. When coughing or sneezing, hold a pillow firmly against your incision with both hands. This is called "splinting." Doing this helps protect your incision. It also decreases belly discomfort.  If you are being admitted to the hospital overnight [place]. After surgery it may be brought to your room.  If you are being discharged the day of surgery, you will not be allowed to drive home. You will need a responsible adult (18 years or older) to drive you home and stay with you that night.   If you are taking public transportation, you will need to have a responsible adult (18 years or older) with you. Please confirm with your physician that it is acceptable to use public transportation.   Please call the [place] [facility] Dept. at [phone] if you have any questions about these instructions.  Visitation Policy:  Patients undergoing a surgery or procedure may have one family member or support person with them as long as that person is not COVID-19 positive or experiencing its symptoms.  That person may remain in the waiting area during the procedure.  Children under 36 years of age may have both parents or legal guardians with them during their hospital stay.   Inpatient Visitation Update:  Two designated support people may visit a patient during visiting hours 7 am to 8 pm. It must be the same two designated people for the duration of the patient stay. The visitors may come and go during the day, and there is no switching out to have different visitors. A mask must be worn at all times, including in the patient room.

## 2019-08-26 ENCOUNTER — Other Ambulatory Visit
Admission: RE | Admit: 2019-08-26 | Discharge: 2019-08-26 | Disposition: A | Discharge status: Home / Self Care | Payer: Medicare HMO | Source: Ambulatory Visit | Attending: Surgery | Admitting: Surgery

## 2019-08-26 DIAGNOSIS — U071 COVID-19: Secondary | ICD-10-CM | POA: Diagnosis not present

## 2019-08-26 DIAGNOSIS — Z01812 Encounter for preprocedural laboratory examination: Secondary | ICD-10-CM | POA: Diagnosis present

## 2019-08-26 LAB — POTASSIUM: Potassium: 3.6 mmol/L (ref 3.5–5.1)

## 2019-08-26 LAB — TYPE AND SCREEN
ABO/RH(D): O POS
Antibody Screen: NEGATIVE

## 2019-08-26 LAB — SURGICAL PCR SCREEN
MRSA, PCR: NEGATIVE
Staphylococcus aureus: NEGATIVE

## 2019-08-26 LAB — SARS CORONAVIRUS 2 (TAT 6-24 HRS): SARS Coronavirus 2: POSITIVE — AB

## 2019-08-27 ENCOUNTER — Telehealth: Payer: Self-pay | Admitting: Unknown Physician Specialty

## 2019-08-27 NOTE — Telephone Encounter (Signed)
Called to discuss with Tina Hayes about Covid symptoms and the use of bamlanivimab, a monoclonal antibody infusion for those with mild to moderate Covid symptoms and at a high risk of hospitalization.     Pt does not qualify for infusion therapy as she has asymptomatic infection. Isolation precautions discussed. Advised to contact back for consideration should she develop symptoms. Patient probably will not comply as she thinks we have the wrong patient.       Patient Active Problem List   Diagnosis Date Noted  . Atherosclerosis of native arteries of the extremities with ulceration ([place]) 07/05/2019  . Onychomycosis 05/30/2019  . Occlusion and stenosis of vertebral artery 08/31/2018  . AKI (acute kidney injury) ([place]) 05/25/2018  . BMI 50.0-59.9, adult ([place]) 04/16/2018  . Seizures ([place]) 11/10/2017  . Mass of right lower leg 09/11/2017  . Dysphagia 04/23/2017  . Chronic diastolic congestive heart failure ([place]) 02/13/2017  . Acute diastolic heart failure ([place]) 01/27/2017  . Depression 12/11/2016  . Essential hypertension 06/30/2016  . Snoring 06/30/2016  . Diabetes mellitus ([place]) 06/30/2016  . Coronary artery disease of native artery of native heart with stable angina pectoris ([place]) 06/17/2016  . Morbid obesity ([name]) 06/17/2016  . COPD (chronic obstructive pulmonary disease) ([place]) 08/28/2015  . COPD with acute exacerbation ([place]) 04/14/2015

## 2019-08-27 NOTE — Telephone Encounter (Signed)
Called to discuss with patient about Covid symptoms and the use of bamlanivimab, a monoclonal antibody infusion for those with mild to moderate Covid symptoms and at a high risk of hospitalization.  Pt is qualified for this infusion at the [facility] infusion center due to Age > 91 and co-morbid conditions   Message left to call back

## 2019-08-30 ENCOUNTER — Other Ambulatory Visit: Payer: Self-pay | Admitting: Cardiovascular Disease

## 2019-08-31 NOTE — Telephone Encounter (Signed)
Please review for a refill on amlodipine. The medication was discontinued but was placed as historical medication at the [facility] visit. Thanks!

## 2019-09-11 ENCOUNTER — Other Ambulatory Visit: Payer: Self-pay | Admitting: Cardiovascular Disease

## 2019-09-12 NOTE — Progress Notes (Deleted)
Patient ID: Tina Hayes, female    DOB: Sep 04, 1945, 74 y.o.   MRN: [id number]  HPI  Tina Hayes is a 74 y/o female with a history of seizures, HTN, cardiac stents, DM, depression, CAD, COPD, chronic back pain, anxiety, obesity, remote tobacco use and chronic heart failure.   Echo report from 02/02/2019 reviewed and showed an EF of 60-65% along with moderate MR/TR and moderately elevated PA pressure. Echo done 06/17/16 and showed an EF of 60-65% along with mild MR and moderate TR. Pulmonary systolic pressure was mildly to moderately elevated. Stress test done 06/18/16.  Has not been admitted or been in the ED in the last 6 months.    She presents today for her follow-up visit with a chief complaint of   Past Medical History:  Diagnosis Date  . (HFpEF) heart failure with preserved ejection fraction ([place])    a. 05/2016 Echo: EF 60-65%, mild to mod LVH, Gr1 DD, mild MR, mildly dil LA, mod TR, mildly to mod increased PASP.  [name] Acute diastolic heart failure ([place]) 01/27/2017  . Anxiety   . Arthritis   . Chest pain 06/16/2016  . CHF (congestive heart failure) ([place])   . Chronic back pain   . Chronic diastolic congestive heart failure ([place]) 02/13/2017  . COPD (chronic obstructive pulmonary disease) ([place])   . Coronary artery disease    a. s/p remote PCI x 5;  b. 2006 s/p CABG x 3 ([place], [place]); b. 05/2016 MV: attenuation corrected images w/o ischemia or wma-->Med rx.  . Coronary artery disease of native artery of native heart with stable angina pectoris ([place]) 06/17/2016  . Depression   . Diabetes mellitus without complication ([place])   . Essential hypertension 06/30/2016  . GERD (gastroesophageal reflux disease)   . Heart attack ([place])    Total of 3 per pt.  . Hypertension   . Hypertensive urgency 06/03/2015  . Seizure ([place])   . Seizures ([place])    Past Surgical History:  Procedure Laterality Date  . ABDOMINAL HYSTERECTOMY    . ANKLE SURGERY Right   . CATARACT EXTRACTION W/  INTRAOCULAR LENS  IMPLANT, BILATERAL    . COLONOSCOPY WITH PROPOFOL N/A 05/04/2017   Procedure: COLONOSCOPY WITH PROPOFOL;  Surgeon: [name], MD;  Location: [facility] ENDOSCOPY;  Service: Endoscopy;  Laterality: N/A;  . CORONARY ANGIOPLASTY    . CORONARY ARTERY BYPASS GRAFT     TRIPLE BYPASS  . ESOPHAGOGASTRODUODENOSCOPY (EGD) WITH PROPOFOL N/A 05/04/2017   Procedure: ESOPHAGOGASTRODUODENOSCOPY (EGD) WITH PROPOFOL;  Surgeon: [name], MD;  Location: [facility] ENDOSCOPY;  Service: Endoscopy;  Laterality: N/A;  . EXCISION MASS LOWER EXTREMETIES Right 03/02/2018   Procedure: EXCISION SOFT TISSUE MASS FROM MEDIAL ASPECT OF RIGHT ANKLE;  Surgeon: [name], MD;  Location: [facility] ORS;  Service: Orthopedics;  Laterality: Right;  . FRACTURE SURGERY    . JOINT REPLACEMENT     right  . KNEE SURGERY Right   . LOWER EXTREMITY ANGIOGRAPHY Right 07/14/2019   Procedure: LOWER EXTREMITY ANGIOGRAPHY;  Surgeon: Algernon Huxley, MD;  Location: [facility] CV LAB;  Service: Cardiovascular;  Laterality: Right;  . MOUTH SURGERY Left 07/14/2017   Family History  Problem Relation Age of Onset  . Diabetes Other   . Hypertension Other   . Diabetes Mother   . Heart failure Mother   . Heart disease Mother   . Heart attack Mother   . Stroke Mother   . Depression Mother   .  Hypertension Mother   . Cancer Sister        brain  . Hypertension Sister   . Diabetes Brother   . Hypertension Brother   . Heart failure Sister   . Heart attack Sister   . SIDS Sister    Social History   Tobacco Use  . Smoking status: Former Smoker    Packs/day: 0.25    Years: 20.00    Pack years: 5.00    Types: Cigarettes    Quit date: 07/01/2006    Years since quitting: 13.2  . Smokeless tobacco: Never Used  Substance Use Topics  . Alcohol use: No    Alcohol/week: 0.0 standard drinks   Allergies  Allergen Reactions  . Aspirin Anaphylaxis      Review of Systems  Constitutional: Positive for fatigue  ("improving"). Negative for appetite change.  HENT: Negative for congestion, postnasal drip and trouble swallowing.   Eyes: Negative.   Respiratory: Positive for chest tightness (at times), shortness of breath and wheezing. Negative for cough.   Cardiovascular: Negative for chest pain, palpitations and leg swelling.  Gastrointestinal: Negative for abdominal distention, abdominal pain and nausea.  Endocrine: Negative.   Genitourinary: Negative.   Musculoskeletal: Positive for arthralgias (left leg pain) and back pain (chronic).  Skin: Negative.   Allergic/Immunologic: Negative.   Neurological: Positive for light-headedness and headaches. Negative for dizziness and weakness.  Hematological: Negative for adenopathy. Does not bruise/bleed easily.  Psychiatric/Behavioral: Positive for dysphoric mood (improving). Negative for sleep disturbance (sleeping on 3 pillows) and suicidal ideas. The patient is not nervous/anxious.       Physical Exam  Constitutional: She is oriented to person, place, and time. She appears well-developed and well-nourished.  HENT:  Head: Normocephalic and atraumatic.  Neck: No JVD present.  Cardiovascular: Normal rate and regular rhythm.  Pulmonary/Chest: Effort normal. She has no wheezes. She has no rales.  Abdominal: Soft. She exhibits no distension. There is no abdominal tenderness.  Musculoskeletal:        General: No tenderness or edema.     Cervical back: Normal range of motion and neck supple.  Neurological: She is alert and oriented to person, place, and time.  Skin: Skin is warm and dry.  Psychiatric: She has a normal mood and affect. Her behavior is normal. Thought content normal.  Nursing note and vitals reviewed.  Assessment & Plan:  1: Chronic heart failure with preserved ejection fraction- - NYHA class II - euvolemic today - weighing daily; Reminded to call for an overnight weight gain of >2 pounds or a weekly weight gain of >5 pounds.  - weight  303.8 from here last visit here 4 months ago - has not been adding salt to her foods and is trying to eat low sodium foods  - saw her cardiologist Rockey Situ) 11/09/17 - BNP 02/01/2019 was 64.0   2: HTN- - BP  - BMP from 07/22/19 reviewed; sodium 141, potassium 3.1, creatinine 0.91 and GFR >60; repeat potassium on 08/26/19 was 3.6  - sees PCP at the [facility] on 05/23/19  3: Diabetes-  - fasting glucose at home this morning was  - A1c on 01/18/2019 was 6.5%  4: COPD- - saw pulmonology Lanney Gins) 07/20/19 - sleep study done 05/26/19   Patient did not bring her medications nor a list. Each medication was verbally reviewed with the patient and she was encouraged to bring the bottles to every visit to confirm accuracy of list.

## 2019-09-13 ENCOUNTER — Ambulatory Visit: Payer: Medicare HMO | Admitting: Family

## 2019-09-13 ENCOUNTER — Telehealth: Payer: Self-pay | Admitting: Family

## 2019-09-13 NOTE — Telephone Encounter (Signed)
Patient did not show for her [facility] Failure [facility] appointment on 09/13/19. Will attempt to reschedule.

## 2019-09-23 ENCOUNTER — Other Ambulatory Visit: Admission: RE | Admit: 2019-09-23 | Payer: Medicare HMO | Source: Ambulatory Visit

## 2019-09-27 ENCOUNTER — Inpatient Hospital Stay
Admission: RE | Admit: 2019-09-27 | Discharge: 2019-10-04 | DRG: 470 | Disposition: A | Discharge status: Skilled Nursing Facility | Payer: Medicare HMO | Attending: Surgery | Admitting: Surgery

## 2019-09-27 ENCOUNTER — Encounter: Payer: Self-pay | Admitting: Surgery

## 2019-09-27 ENCOUNTER — Encounter
Admission: RE | Disposition: A | Discharge status: Skilled Nursing Facility | Payer: Self-pay | Source: Home / Self Care | Attending: Surgery

## 2019-09-27 ENCOUNTER — Inpatient Hospital Stay: Payer: Medicare HMO | Admitting: Registered Nurse

## 2019-09-27 ENCOUNTER — Inpatient Hospital Stay: Payer: Medicare HMO

## 2019-09-27 ENCOUNTER — Other Ambulatory Visit: Payer: Self-pay

## 2019-09-27 DIAGNOSIS — B379 Candidiasis, unspecified: Secondary | ICD-10-CM | POA: Diagnosis present

## 2019-09-27 DIAGNOSIS — K219 Gastro-esophageal reflux disease without esophagitis: Secondary | ICD-10-CM | POA: Diagnosis present

## 2019-09-27 DIAGNOSIS — R509 Fever, unspecified: Secondary | ICD-10-CM

## 2019-09-27 DIAGNOSIS — I5032 Chronic diastolic (congestive) heart failure: Secondary | ICD-10-CM | POA: Diagnosis present

## 2019-09-27 DIAGNOSIS — E1151 Type 2 diabetes mellitus with diabetic peripheral angiopathy without gangrene: Secondary | ICD-10-CM | POA: Diagnosis present

## 2019-09-27 DIAGNOSIS — M549 Dorsalgia, unspecified: Secondary | ICD-10-CM | POA: Diagnosis present

## 2019-09-27 DIAGNOSIS — F418 Other specified anxiety disorders: Secondary | ICD-10-CM | POA: Diagnosis present

## 2019-09-27 DIAGNOSIS — Z79899 Other long term (current) drug therapy: Secondary | ICD-10-CM | POA: Diagnosis not present

## 2019-09-27 DIAGNOSIS — Z87891 Personal history of nicotine dependence: Secondary | ICD-10-CM

## 2019-09-27 DIAGNOSIS — M1990 Unspecified osteoarthritis, unspecified site: Secondary | ICD-10-CM | POA: Diagnosis present

## 2019-09-27 DIAGNOSIS — N390 Urinary tract infection, site not specified: Secondary | ICD-10-CM | POA: Diagnosis not present

## 2019-09-27 DIAGNOSIS — Z951 Presence of aortocoronary bypass graft: Secondary | ICD-10-CM | POA: Diagnosis not present

## 2019-09-27 DIAGNOSIS — Z20822 Contact with and (suspected) exposure to covid-19: Secondary | ICD-10-CM | POA: Diagnosis present

## 2019-09-27 DIAGNOSIS — G8929 Other chronic pain: Secondary | ICD-10-CM | POA: Diagnosis present

## 2019-09-27 DIAGNOSIS — Z6841 Body Mass Index (BMI) 40.0 and over, adult: Secondary | ICD-10-CM

## 2019-09-27 DIAGNOSIS — M1712 Unilateral primary osteoarthritis, left knee: Principal | ICD-10-CM | POA: Diagnosis present

## 2019-09-27 DIAGNOSIS — Z794 Long term (current) use of insulin: Secondary | ICD-10-CM | POA: Diagnosis not present

## 2019-09-27 DIAGNOSIS — I110 Hypertensive heart disease with heart failure: Secondary | ICD-10-CM | POA: Diagnosis present

## 2019-09-27 DIAGNOSIS — J449 Chronic obstructive pulmonary disease, unspecified: Secondary | ICD-10-CM | POA: Diagnosis present

## 2019-09-27 DIAGNOSIS — I252 Old myocardial infarction: Secondary | ICD-10-CM | POA: Diagnosis not present

## 2019-09-27 DIAGNOSIS — I2510 Atherosclerotic heart disease of native coronary artery without angina pectoris: Secondary | ICD-10-CM | POA: Diagnosis present

## 2019-09-27 DIAGNOSIS — Z96652 Presence of left artificial knee joint: Secondary | ICD-10-CM

## 2019-09-27 DIAGNOSIS — E119 Type 2 diabetes mellitus without complications: Secondary | ICD-10-CM | POA: Diagnosis present

## 2019-09-27 HISTORY — PX: TOTAL KNEE ARTHROPLASTY: SHX125

## 2019-09-27 LAB — TYPE AND SCREEN
ABO/RH(D): O POS
Antibody Screen: NEGATIVE

## 2019-09-27 LAB — GLUCOSE, CAPILLARY
Glucose-Capillary: 36 mg/dL — CL (ref 70–99)
Glucose-Capillary: 36 mg/dL — CL (ref 70–99)
Glucose-Capillary: 51 mg/dL — ABNORMAL LOW (ref 70–99)
Glucose-Capillary: 96 mg/dL (ref 70–99)
Glucose-Capillary: 92 mg/dL (ref 70–99)
Glucose-Capillary: 158 mg/dL — ABNORMAL HIGH (ref 70–99)

## 2019-09-27 LAB — GLUCOSE, RANDOM: Glucose, Bld: 94 mg/dL (ref 70–99)

## 2019-09-27 SURGERY — ARTHROPLASTY, KNEE, TOTAL
Anesthesia: Spinal | Site: Knee | Laterality: Left

## 2019-09-27 MED ORDER — BUPIVACAINE HCL (PF) 0.5 % IJ SOLN
INTRAMUSCULAR | Status: AC
  Filled 2019-09-27: qty 30

## 2019-09-27 MED ORDER — TORSEMIDE 20 MG PO TABS
60.0000 mg | ORAL_TABLET | Freq: Two times a day (BID) | ORAL | Status: DC
  Administered 2019-09-27 – 2019-10-04 (×9): 60 mg via ORAL
  Filled 2019-09-27 (×10): qty 3

## 2019-09-27 MED ORDER — GLYCOPYRROLATE 0.2 MG/ML IJ SOLN
INTRAMUSCULAR | Status: AC
  Filled 2019-09-27: qty 1

## 2019-09-27 MED ORDER — PROPOFOL 10 MG/ML IV BOLUS
INTRAVENOUS | Status: AC
  Filled 2019-09-27: qty 20

## 2019-09-27 MED ORDER — PHENYLEPHRINE HCL (PRESSORS) 10 MG/ML IV SOLN
INTRAVENOUS | Status: AC
  Filled 2019-09-27: qty 1

## 2019-09-27 MED ORDER — BUPIVACAINE LIPOSOME 1.3 % IJ SUSP
INTRAMUSCULAR | Status: AC
  Filled 2019-09-27: qty 20

## 2019-09-27 MED ORDER — METOCLOPRAMIDE HCL 10 MG PO TABS
5.0000 mg | ORAL_TABLET | Freq: Three times a day (TID) | ORAL | Status: DC | PRN
  Administered 2019-10-04: 10 mg via ORAL
  Filled 2019-09-27: qty 1

## 2019-09-27 MED ORDER — ENOXAPARIN SODIUM 40 MG/0.4ML ~~LOC~~ SOLN: 40.0000 mg | SUBCUTANEOUS | Status: DC

## 2019-09-27 MED ORDER — KETAMINE HCL 50 MG/ML IJ SOLN
INTRAMUSCULAR | Status: AC
  Filled 2019-09-27: qty 10

## 2019-09-27 MED ORDER — TIOTROPIUM BROMIDE MONOHYDRATE 18 MCG IN CAPS
18.0000 ug | ORAL_CAPSULE | Freq: Every day | RESPIRATORY_TRACT | Status: DC
  Administered 2019-09-28 – 2019-10-04 (×7): 18 ug via RESPIRATORY_TRACT
  Filled 2019-09-27 (×2): qty 5

## 2019-09-27 MED ORDER — FERROUS SULFATE 325 (65 FE) MG PO TABS
325.0000 mg | ORAL_TABLET | Freq: Every day | ORAL | Status: DC
  Administered 2019-09-28 – 2019-10-04 (×7): 325 mg via ORAL
  Filled 2019-09-27 (×7): qty 1

## 2019-09-27 MED ORDER — CARVEDILOL 3.125 MG PO TABS
3.1250 mg | ORAL_TABLET | Freq: Two times a day (BID) | ORAL | Status: DC
  Administered 2019-09-28 – 2019-10-04 (×11): 3.125 mg via ORAL
  Filled 2019-09-27 (×11): qty 1

## 2019-09-27 MED ORDER — METOCLOPRAMIDE HCL 5 MG/ML IJ SOLN: 5.0000 mg | Freq: Three times a day (TID) | INTRAMUSCULAR | Status: DC | PRN

## 2019-09-27 MED ORDER — POTASSIUM CHLORIDE CRYS ER 20 MEQ PO TBCR
40.0000 meq | EXTENDED_RELEASE_TABLET | Freq: Two times a day (BID) | ORAL | Status: DC
  Administered 2019-09-28 – 2019-10-04 (×13): 40 meq via ORAL
  Filled 2019-09-27 (×15): qty 2

## 2019-09-27 MED ORDER — BUPIVACAINE HCL (PF) 0.5 % IJ SOLN
INTRAMUSCULAR | Status: AC
  Filled 2019-09-27: qty 10

## 2019-09-27 MED ORDER — DEXTROSE 5 % IV SOLN
3.0000 g | Freq: Four times a day (QID) | INTRAVENOUS | Status: AC
  Administered 2019-09-27 – 2019-09-28 (×3): 3 g via INTRAVENOUS
  Filled 2019-09-27 (×4): qty 3000
  Filled 2019-09-27: qty 3

## 2019-09-27 MED ORDER — LORATADINE 10 MG PO TABS
10.0000 mg | ORAL_TABLET | Freq: Every day | ORAL | Status: DC
  Administered 2019-09-28 – 2019-10-04 (×6): 10 mg via ORAL
  Filled 2019-09-27 (×7): qty 1

## 2019-09-27 MED ORDER — ISOSORBIDE MONONITRATE ER 30 MG PO TB24
30.0000 mg | ORAL_TABLET | Freq: Two times a day (BID) | ORAL | Status: DC
  Administered 2019-09-27 – 2019-10-04 (×12): 30 mg via ORAL
  Filled 2019-09-27 (×12): qty 1

## 2019-09-27 MED ORDER — INSULIN GLARGINE 100 UNIT/ML ~~LOC~~ SOLN
30.0000 [IU] | Freq: Every morning | SUBCUTANEOUS | Status: DC
  Administered 2019-09-28 – 2019-09-29 (×2): 30 [IU] via SUBCUTANEOUS
  Filled 2019-09-27 (×2): qty 0.3

## 2019-09-27 MED ORDER — LOSARTAN POTASSIUM 50 MG PO TABS
100.0000 mg | ORAL_TABLET | Freq: Every day | ORAL | Status: DC
  Administered 2019-09-29 – 2019-10-04 (×5): 100 mg via ORAL
  Filled 2019-09-27 (×6): qty 2

## 2019-09-27 MED ORDER — FENTANYL CITRATE (PF) 100 MCG/2ML IJ SOLN
INTRAMUSCULAR | Status: DC | PRN
  Administered 2019-09-27: 50 ug via INTRAVENOUS

## 2019-09-27 MED ORDER — CHLORHEXIDINE GLUCONATE 4 % EX LIQD
60.0000 mL | Freq: Once | CUTANEOUS | Status: AC
  Administered 2019-09-27: 4 via TOPICAL

## 2019-09-27 MED ORDER — BUPIVACAINE HCL (PF) 0.5 % IJ SOLN
INTRAMUSCULAR | Status: DC | PRN
  Administered 2019-09-27: 2.8 mL

## 2019-09-27 MED ORDER — SODIUM CHLORIDE 0.9 % IV SOLN: INTRAVENOUS | Status: DC

## 2019-09-27 MED ORDER — KETAMINE HCL 10 MG/ML IJ SOLN
INTRAMUSCULAR | Status: DC | PRN
  Administered 2019-09-27: 10 mg via INTRAVENOUS
  Administered 2019-09-27 (×2): 20 mg via INTRAVENOUS

## 2019-09-27 MED ORDER — DEXTROSE 50 % IV SOLN: 0.5000 | Freq: Once | INTRAVENOUS | Status: DC

## 2019-09-27 MED ORDER — FENTANYL CITRATE (PF) 100 MCG/2ML IJ SOLN
INTRAMUSCULAR | Status: AC
  Filled 2019-09-27: qty 2

## 2019-09-27 MED ORDER — PROPOFOL 500 MG/50ML IV EMUL
INTRAVENOUS | Status: AC
  Filled 2019-09-27: qty 50

## 2019-09-27 MED ORDER — TRAMADOL HCL 50 MG PO TABS
50.0000 mg | ORAL_TABLET | Freq: Four times a day (QID) | ORAL | Status: DC
  Administered 2019-09-27 – 2019-10-04 (×20): 50 mg via ORAL
  Filled 2019-09-27 (×21): qty 1

## 2019-09-27 MED ORDER — SODIUM CHLORIDE (PF) 0.9 % IJ SOLN
INTRAMUSCULAR | Status: AC
  Filled 2019-09-27: qty 10

## 2019-09-27 MED ORDER — DEXTROSE-NACL 5-0.2 % IV SOLN: INTRAVENOUS | Status: DC | PRN

## 2019-09-27 MED ORDER — ATORVASTATIN CALCIUM 20 MG PO TABS
40.0000 mg | ORAL_TABLET | Freq: Every day | ORAL | Status: DC
  Administered 2019-09-27 – 2019-10-03 (×7): 40 mg via ORAL
  Filled 2019-09-27 (×7): qty 2

## 2019-09-27 MED ORDER — ONDANSETRON HCL 4 MG/2ML IJ SOLN
4.0000 mg | Freq: Four times a day (QID) | INTRAMUSCULAR | Status: DC | PRN
  Administered 2019-09-27 – 2019-10-02 (×3): 4 mg via INTRAVENOUS
  Filled 2019-09-27 (×3): qty 2

## 2019-09-27 MED ORDER — SODIUM CHLORIDE FLUSH 0.9 % IV SOLN
INTRAVENOUS | Status: AC
  Filled 2019-09-27: qty 40

## 2019-09-27 MED ORDER — MAGNESIUM HYDROXIDE 400 MG/5ML PO SUSP
30.0000 mL | Freq: Every day | ORAL | Status: DC | PRN
  Filled 2019-09-27: qty 30

## 2019-09-27 MED ORDER — EPHEDRINE 5 MG/ML INJ
INTRAVENOUS | Status: AC
  Filled 2019-09-27: qty 10

## 2019-09-27 MED ORDER — ROPINIROLE HCL 1 MG PO TABS
1.0000 mg | ORAL_TABLET | Freq: Every day | ORAL | Status: DC
  Administered 2019-09-27 – 2019-10-03 (×7): 1 mg via ORAL
  Filled 2019-09-27 (×7): qty 1

## 2019-09-27 MED ORDER — MONTELUKAST SODIUM 10 MG PO TABS
10.0000 mg | ORAL_TABLET | Freq: Every day | ORAL | Status: DC
  Administered 2019-09-27 – 2019-10-03 (×7): 10 mg via ORAL
  Filled 2019-09-27 (×7): qty 1

## 2019-09-27 MED ORDER — GLYCOPYRROLATE 0.2 MG/ML IJ SOLN
INTRAMUSCULAR | Status: DC | PRN
Start: 2019-09-27 — End: 2019-09-27
  Administered 2019-09-27: .2 mg via INTRAVENOUS

## 2019-09-27 MED ORDER — INSULIN GLARGINE 100 UNIT/ML ~~LOC~~ SOLN
40.0000 [IU] | Freq: Every day | SUBCUTANEOUS | Status: DC
  Administered 2019-09-27: 40 [IU] via SUBCUTANEOUS
  Filled 2019-09-27 (×3): qty 0.4

## 2019-09-27 MED ORDER — ACETAMINOPHEN 10 MG/ML IV SOLN
INTRAVENOUS | Status: DC | PRN
  Administered 2019-09-27: 1000 mg via INTRAVENOUS

## 2019-09-27 MED ORDER — SODIUM CHLORIDE 0.9 % IV SOLN
INTRAVENOUS | Status: DC | PRN
  Administered 2019-09-27: 60 mL

## 2019-09-27 MED ORDER — BISACODYL 10 MG RE SUPP: 10.0000 mg | Freq: Every day | RECTAL | Status: DC | PRN

## 2019-09-27 MED ORDER — DOCUSATE SODIUM 100 MG PO CAPS
100.0000 mg | ORAL_CAPSULE | Freq: Two times a day (BID) | ORAL | Status: DC
  Administered 2019-09-27 – 2019-10-04 (×13): 100 mg via ORAL
  Filled 2019-09-27 (×14): qty 1

## 2019-09-27 MED ORDER — EPHEDRINE SULFATE 50 MG/ML IJ SOLN
INTRAMUSCULAR | Status: DC | PRN
  Administered 2019-09-27 (×2): 10 mg via INTRAVENOUS
  Administered 2019-09-27: 5 mg via INTRAVENOUS
  Administered 2019-09-27 (×2): 10 mg via INTRAVENOUS

## 2019-09-27 MED ORDER — DIPHENHYDRAMINE HCL 12.5 MG/5ML PO ELIX: 12.5000 mg | ORAL_SOLUTION | ORAL | Status: DC | PRN

## 2019-09-27 MED ORDER — TRANEXAMIC ACID 1000 MG/10ML IV SOLN
INTRAVENOUS | Status: AC
  Filled 2019-09-27: qty 10

## 2019-09-27 MED ORDER — EPINEPHRINE PF 1 MG/ML IJ SOLN
INTRAMUSCULAR | Status: AC
  Filled 2019-09-27: qty 1

## 2019-09-27 MED ORDER — IPRATROPIUM-ALBUTEROL 20-100 MCG/ACT IN AERS: 1.0000 | INHALATION_SPRAY | Freq: Four times a day (QID) | RESPIRATORY_TRACT | Status: DC | PRN

## 2019-09-27 MED ORDER — ALBUTEROL SULFATE (2.5 MG/3ML) 0.083% IN NEBU
2.5000 mg | INHALATION_SOLUTION | RESPIRATORY_TRACT | Status: DC | PRN
  Administered 2019-09-28 – 2019-09-30 (×3): 2.5 mg via RESPIRATORY_TRACT
  Filled 2019-09-27 (×3): qty 3

## 2019-09-27 MED ORDER — DEXTROSE 5 % IV SOLN
3.0000 g | INTRAVENOUS | Status: AC
  Administered 2019-09-27: 3 g via INTRAVENOUS
  Filled 2019-09-27: qty 3

## 2019-09-27 MED ORDER — SODIUM CHLORIDE 0.9 % IV SOLN
INTRAVENOUS | Status: DC | PRN
  Administered 2019-09-27: 30 ug/min via INTRAVENOUS

## 2019-09-27 MED ORDER — PANTOPRAZOLE SODIUM 40 MG PO TBEC
40.0000 mg | DELAYED_RELEASE_TABLET | Freq: Every day | ORAL | Status: DC
  Administered 2019-09-28 – 2019-10-04 (×7): 40 mg via ORAL
  Filled 2019-09-27 (×7): qty 1

## 2019-09-27 MED ORDER — TRAZODONE HCL 50 MG PO TABS
50.0000 mg | ORAL_TABLET | Freq: Every day | ORAL | Status: DC
  Administered 2019-09-28 – 2019-10-03 (×6): 50 mg via ORAL
  Filled 2019-09-27 (×7): qty 1

## 2019-09-27 MED ORDER — NITROGLYCERIN 0.4 MG SL SUBL: 0.4000 mg | SUBLINGUAL_TABLET | SUBLINGUAL | Status: DC | PRN

## 2019-09-27 MED ORDER — PROPOFOL 10 MG/ML IV BOLUS
INTRAVENOUS | Status: DC | PRN
  Administered 2019-09-27: 40 mg via INTRAVENOUS
  Administered 2019-09-27: 50 mg via INTRAVENOUS

## 2019-09-27 MED ORDER — ACETAMINOPHEN 10 MG/ML IV SOLN
INTRAVENOUS | Status: AC
  Filled 2019-09-27: qty 100

## 2019-09-27 MED ORDER — ACETAMINOPHEN 500 MG PO TABS
1000.0000 mg | ORAL_TABLET | Freq: Four times a day (QID) | ORAL | Status: AC
  Administered 2019-09-27 – 2019-09-28 (×3): 1000 mg via ORAL
  Filled 2019-09-27 (×4): qty 2

## 2019-09-27 MED ORDER — MOMETASONE FURO-FORMOTEROL FUM 200-5 MCG/ACT IN AERO
2.0000 | INHALATION_SPRAY | Freq: Two times a day (BID) | RESPIRATORY_TRACT | Status: DC
  Administered 2019-09-28 – 2019-10-04 (×13): 2 via RESPIRATORY_TRACT
  Filled 2019-09-27: qty 8.8

## 2019-09-27 MED ORDER — OXYCODONE HCL 5 MG PO TABS
5.0000 mg | ORAL_TABLET | ORAL | Status: DC | PRN
  Administered 2019-09-27: 10 mg via ORAL
  Administered 2019-09-28: 5 mg via ORAL
  Administered 2019-09-28 – 2019-09-29 (×3): 10 mg via ORAL
  Administered 2019-10-01: 5 mg via ORAL
  Administered 2019-10-01: 10 mg via ORAL
  Administered 2019-10-02: 5 mg via ORAL
  Administered 2019-10-03 (×2): 10 mg via ORAL
  Filled 2019-09-27 (×2): qty 2
  Filled 2019-09-27: qty 1
  Filled 2019-09-27 (×3): qty 2
  Filled 2019-09-27: qty 1
  Filled 2019-09-27: qty 2
  Filled 2019-09-27: qty 1
  Filled 2019-09-27: qty 2
  Filled 2019-09-27: qty 1

## 2019-09-27 MED ORDER — DEXMEDETOMIDINE HCL 200 MCG/2ML IV SOLN
INTRAVENOUS | Status: DC | PRN
  Administered 2019-09-27: 20 ug via INTRAVENOUS

## 2019-09-27 MED ORDER — DEXMEDETOMIDINE HCL IN NACL 80 MCG/20ML IV SOLN
INTRAVENOUS | Status: AC
  Filled 2019-09-27: qty 20

## 2019-09-27 MED ORDER — AMLODIPINE BESYLATE 10 MG PO TABS
10.0000 mg | ORAL_TABLET | Freq: Every day | ORAL | Status: DC
  Administered 2019-09-29 – 2019-10-04 (×5): 10 mg via ORAL
  Filled 2019-09-27 (×5): qty 1

## 2019-09-27 MED ORDER — SEVOFLURANE IN SOLN
RESPIRATORY_TRACT | Status: AC
  Filled 2019-09-27: qty 250

## 2019-09-27 MED ORDER — BUDESONIDE 0.5 MG/2ML IN SUSP
0.5000 mg | Freq: Every day | RESPIRATORY_TRACT | Status: DC
  Administered 2019-09-28 – 2019-10-04 (×7): 0.5 mg via RESPIRATORY_TRACT
  Filled 2019-09-27 (×6): qty 2

## 2019-09-27 MED ORDER — DEXTROSE 50 % IV SOLN
INTRAVENOUS | Status: AC
  Filled 2019-09-27: qty 50

## 2019-09-27 MED ORDER — HYDROMORPHONE HCL 1 MG/ML IJ SOLN
0.2500 mg | INTRAMUSCULAR | Status: DC | PRN
  Administered 2019-09-30: 0.5 mg via INTRAVENOUS
  Filled 2019-09-27: qty 1

## 2019-09-27 MED ORDER — BUPIVACAINE-EPINEPHRINE (PF) 0.5% -1:200000 IJ SOLN
INTRAMUSCULAR | Status: DC | PRN
  Administered 2019-09-27: 30 mL

## 2019-09-27 MED ORDER — OXYCODONE HCL 5 MG PO TABS
ORAL_TABLET | ORAL | Status: AC
  Administered 2019-09-27: 5 mg via ORAL
  Filled 2019-09-27: qty 1

## 2019-09-27 MED ORDER — ACETAMINOPHEN 325 MG PO TABS
325.0000 mg | ORAL_TABLET | Freq: Four times a day (QID) | ORAL | Status: DC | PRN
  Administered 2019-09-30: 650 mg via ORAL
  Filled 2019-09-27: qty 2

## 2019-09-27 MED ORDER — LIDOCAINE HCL (PF) 2 % IJ SOLN
INTRAMUSCULAR | Status: AC
  Filled 2019-09-27: qty 5

## 2019-09-27 MED ORDER — SERTRALINE HCL 100 MG PO TABS
100.0000 mg | ORAL_TABLET | Freq: Every day | ORAL | Status: DC
  Administered 2019-09-27 – 2019-10-03 (×7): 100 mg via ORAL
  Filled 2019-09-27: qty 2
  Filled 2019-09-27: qty 1
  Filled 2019-09-27: qty 2
  Filled 2019-09-27 (×3): qty 1
  Filled 2019-09-27 (×2): qty 2
  Filled 2019-09-27 (×2): qty 1
  Filled 2019-09-27 (×3): qty 2
  Filled 2019-09-27 (×2): qty 1

## 2019-09-27 MED ORDER — FLEET ENEMA 7-19 GM/118ML RE ENEM: 1.0000 | ENEMA | Freq: Once | RECTAL | Status: DC | PRN

## 2019-09-27 MED ORDER — ONDANSETRON HCL 4 MG PO TABS: 4.0000 mg | ORAL_TABLET | Freq: Four times a day (QID) | ORAL | Status: DC | PRN

## 2019-09-27 MED ORDER — ALBUTEROL SULFATE HFA 108 (90 BASE) MCG/ACT IN AERS
2.0000 | INHALATION_SPRAY | Freq: Four times a day (QID) | RESPIRATORY_TRACT | Status: DC | PRN
  Filled 2019-09-27: qty 6.7

## 2019-09-27 MED ORDER — PHENYLEPHRINE HCL (PRESSORS) 10 MG/ML IV SOLN
INTRAVENOUS | Status: DC | PRN
  Administered 2019-09-27: 50 ug via INTRAVENOUS
  Administered 2019-09-27: 300 ug via INTRAVENOUS
  Administered 2019-09-27 (×2): 100 ug via INTRAVENOUS
  Administered 2019-09-27: 50 ug via INTRAVENOUS

## 2019-09-27 MED ORDER — INSULIN ASPART 100 UNIT/ML ~~LOC~~ SOLN
0.0000 [IU] | Freq: Three times a day (TID) | SUBCUTANEOUS | Status: DC
  Administered 2019-09-28: 3 [IU] via SUBCUTANEOUS
  Filled 2019-09-27 (×2): qty 1

## 2019-09-27 MED ORDER — LEVETIRACETAM 750 MG PO TABS
750.0000 mg | ORAL_TABLET | Freq: Two times a day (BID) | ORAL | Status: DC
  Administered 2019-09-27 – 2019-10-04 (×13): 750 mg via ORAL
  Filled 2019-09-27 (×16): qty 1

## 2019-09-27 MED ORDER — PROPOFOL 500 MG/50ML IV EMUL
INTRAVENOUS | Status: DC | PRN
  Administered 2019-09-27: 75 ug/kg/min via INTRAVENOUS

## 2019-09-27 SURGICAL SUPPLY — 63 items
BLADE SAW SAG 25X90X1.19 (BLADE) ×2 IMPLANT
BLADE SURG SZ20 CARB STEEL (BLADE) ×2 IMPLANT
BNDG ELASTIC 6X5.8 VLCR NS LF (GAUZE/BANDAGES/DRESSINGS) ×2 IMPLANT
CANISTER SUCT 1200ML W/VALVE (MISCELLANEOUS) ×2 IMPLANT
CANISTER SUCT 3000ML PPV (MISCELLANEOUS) ×2 IMPLANT
CEMENT BONE R 1X40 (Cement) ×4 IMPLANT
CEMENT VACUUM MIXING SYSTEM (MISCELLANEOUS) ×2 IMPLANT
CHLORAPREP W/TINT 26 (MISCELLANEOUS) ×2 IMPLANT
COOLER POLAR GLACIER W/PUMP (MISCELLANEOUS) ×2 IMPLANT
COVER MAYO STAND REUSABLE (DRAPES) ×2 IMPLANT
COVER WAND RF STERILE (DRAPES) ×2 IMPLANT
CUFF TOURN SGL QUICK 24 (TOURNIQUET CUFF)
CUFF TOURN SGL QUICK 30 (TOURNIQUET CUFF)
CUFF TOURN SGL QUICK 34 (TOURNIQUET CUFF) ×1
CUFF TRNQT CYL 24X4X16.5-23 (TOURNIQUET CUFF) IMPLANT
CUFF TRNQT CYL 30X4X21-28X (TOURNIQUET CUFF) IMPLANT
CUFF TRNQT CYL 34X4.125X (TOURNIQUET CUFF) IMPLANT
DRAPE 3/4 80X56 (DRAPES) ×2 IMPLANT
DRAPE IMP U-DRAPE 54X76 (DRAPES) ×2 IMPLANT
DRSG OPSITE POSTOP 4X10 (GAUZE/BANDAGES/DRESSINGS) ×2 IMPLANT
DRSG OPSITE POSTOP 4X8 (GAUZE/BANDAGES/DRESSINGS) ×2 IMPLANT
ELECT CAUTERY BLADE 6.4 (BLADE) ×2 IMPLANT
ELECT REM PT RETURN 9FT ADLT (ELECTROSURGICAL) ×2
ELECTRODE REM PT RTRN 9FT ADLT (ELECTROSURGICAL) ×1 IMPLANT
FEMORAL CR LEFT 65MM (Joint) ×1 IMPLANT
GLOVE BIO SURGEON STRL SZ7.5 (GLOVE) ×8 IMPLANT
GLOVE BIO SURGEON STRL SZ8 (GLOVE) ×8 IMPLANT
GLOVE BIOGEL PI IND STRL 8 (GLOVE) ×1 IMPLANT
GLOVE BIOGEL PI INDICATOR 8 (GLOVE) ×1
GLOVE INDICATOR 8.0 STRL GRN (GLOVE) ×2 IMPLANT
GOWN STRL REUS W/ TWL LRG LVL3 (GOWN DISPOSABLE) ×1 IMPLANT
GOWN STRL REUS W/ TWL XL LVL3 (GOWN DISPOSABLE) ×1 IMPLANT
GOWN STRL REUS W/TWL LRG LVL3 (GOWN DISPOSABLE) ×1
GOWN STRL REUS W/TWL XL LVL3 (GOWN DISPOSABLE) ×1
HOLDER FOLEY CATH W/STRAP (MISCELLANEOUS) IMPLANT
HOOD PEEL AWAY FLYTE STAYCOOL (MISCELLANEOUS) ×7 IMPLANT
KIT TURNOVER KIT A (KITS) ×2 IMPLANT
KNEE AS TIBIAL BEARING 18M 75M (Orthopedic Implant) ×1 IMPLANT
NDL SAFETY ECLIPSE 18X1.5 (NEEDLE) ×2 IMPLANT
NDL SPNL 20GX3.5 QUINCKE YW (NEEDLE) ×1 IMPLANT
NEEDLE HYPO 18GX1.5 SHARP (NEEDLE) ×2
NEEDLE SPNL 20GX3.5 QUINCKE YW (NEEDLE) ×2 IMPLANT
NS IRRIG 1000ML POUR BTL (IV SOLUTION) ×2 IMPLANT
PACK TOTAL KNEE (MISCELLANEOUS) ×2 IMPLANT
PAD WRAPON POLAR KNEE LONG (MISCELLANEOUS) IMPLANT
PATELLA SERIES A (Orthopedic Implant) ×1 IMPLANT
PLATE KNEE TIBIAL 75MM FIXED (Plate) ×1 IMPLANT
PULSAVAC PLUS IRRIG FAN TIP (DISPOSABLE) ×2
SET GUIDE SURG MEDIAL KNEE (INSTRUMENTS) ×1 IMPLANT
SOL .9 NS 3000ML IRR  AL (IV SOLUTION) ×1
SOL .9 NS 3000ML IRR UROMATIC (IV SOLUTION) ×1 IMPLANT
STAPLER SKIN PROX 35W (STAPLE) ×2 IMPLANT
SUCTION FRAZIER HANDLE 10FR (MISCELLANEOUS) ×1
SUCTION TUBE FRAZIER 10FR DISP (MISCELLANEOUS) ×1 IMPLANT
SUT VIC AB 0 CT1 36 (SUTURE) ×6 IMPLANT
SUT VIC AB 2-0 CT1 27 (SUTURE) ×3
SUT VIC AB 2-0 CT1 TAPERPNT 27 (SUTURE) ×3 IMPLANT
SYR 10ML LL (SYRINGE) ×2 IMPLANT
SYR 20ML LL LF (SYRINGE) ×2 IMPLANT
SYR 30ML LL (SYRINGE) ×6 IMPLANT
TIP FAN IRRIG PULSAVAC PLUS (DISPOSABLE) ×1 IMPLANT
TRAY FOLEY MTR SLVR 16FR STAT (SET/KITS/TRAYS/PACK) IMPLANT
WRAPON POLAR PAD KNEE LONG (MISCELLANEOUS) ×2

## 2019-09-27 NOTE — Progress Notes (Signed)
   09/27/19 1355  Clinical Encounter Type  Visited With Patient  Visit Type Follow-up  Referral From Chaplain  Consult/Referral To Chaplain  Chaplain checked in on patient. Patient said that she is feeling better. Patient was eating lunch and chaplain told her that she will check on her tomorrow.

## 2019-09-27 NOTE — Progress Notes (Signed)
Tina Hayes requested prayer following her surgery for healing. She is alone in hospital with support from family via phone. She desires healing to be able to enjoy her grandchildren and great-grandchildren. She also desires an admission for PT at a place near her home. She is thankful for a better procedure compared to her last surgery for which she laments the trauma she went through which affected her willingness to have this procedure. She states she feels this has gone much better and she is thankful for the care she is receiving. Prayer was offered for healing and resources. No other distress or concerns were shared.     09/27/19 1900  Clinical Encounter Type  Visited With Patient  Visit Type Initial  Referral From Patient  Spiritual Encounters  Spiritual Needs Prayer

## 2019-09-27 NOTE — Anesthesia Preprocedure Evaluation (Addendum)
Anesthesia Evaluation  Patient identified by MRN, date of birth, ID band Patient awake    Reviewed: Allergy & Precautions, H&P , NPO status , Patient's Chart, lab work & pertinent test results, reviewed documented beta blocker date and time   History of Anesthesia Complications Negative for: history of anesthetic complications  Airway Mallampati: II  TM Distance: <3 FB Neck ROM: full    Dental no notable dental hx. (+) Dental Advidsory Given, Edentulous Upper, Edentulous Lower   Pulmonary shortness of breath and with exertion, asthma , sleep apnea , COPD,  COPD inhaler, neg recent URI, former smoker,     + decreased breath sounds      Cardiovascular Exercise Tolerance: Good hypertension, Pt. on medications + angina (stable angina) + CAD, + Past MI, + Cardiac Stents, + CABG, + Peripheral Vascular Disease and +CHF  (-) dysrhythmias (-) Valvular Problems/Murmurs Rhythm:Regular Rate:Normal - Systolic murmurs TTE [id number]: 1. Left ventricular ejection fraction, by visual estimation, is 60 to  65%. The left ventricle has normal function. Normal left ventricular size.  There is mildly increased left ventricular hypertrophy.  2. Left ventricular diastolic Doppler parameters are consistent with  impaired relaxation pattern of LV diastolic filling.  3. Global right ventricle has normal systolic function.The right  ventricular size is mildly enlarged. No increase in right ventricular wall  thickness.  4. Left atrial size was mildly dilated.  5. Moderate mitral valve regurgitation.  6. The tricuspid valve is normal in structure. Tricuspid valve  regurgitation moderate.  7. Moderately elevated pulmonary artery systolic pressure.  8. The tricuspid regurgitant velocity is 2.83 m/s, and with an assumed  right atrial pressure of 15 mmHg, the estimated right ventricular systolic  pressure is moderately elevated at 47.0     Neuro/Psych Seizures -,  PSYCHIATRIC DISORDERS Anxiety Depression    GI/Hepatic Neg liver ROS, GERD  ,Patient has not taken her nexium in 2 weeks due to insurance issues but says her GERD has been fine for the past 3-4 days.   Endo/Other  diabetes, Well Controlled, Type 2, Oral Hypoglycemic Agents, Insulin DependentMorbid obesity  Renal/GU negative Renal ROS  negative genitourinary   Musculoskeletal  (+) Arthritis , Osteoarthritis,    Abdominal (+) + obese,   Peds  Hematology negative hematology ROS (+)   Anesthesia Other Findings Past Medical History: No date: (HFpEF) heart failure with preserved ejection fraction ([place])     Comment:  a. 05/2016 Echo: EF 60-65%, mild to mod LVH, Gr1 DD, mild              MR, mildly dil LA, mod TR, mildly to mod increased PASP. No date: Anxiety No date: CHF (congestive heart failure) (HCC) No date: Chronic back pain No date: COPD (chronic obstructive pulmonary disease) (H[facility]) No date: Coronary artery disease     Comment:  a. s/p remote PCI x 5;  b. 2006 s/p CABG x 3               ([place], [place]); b. 05/2016               MV: attenuation corrected images w/o ischemia or               wma-->Med rx. No date: Depression No date: Diabetes mellitus without complication (H[facility]) No date: Heart attack [facility])     Comment:  Total of 3 per pt. No date: Hypertension No date: Seizures (H[facility])   Reproductive/Obstetrics negative OB ROS  Anesthesia Physical  Anesthesia Plan  ASA: III  Anesthesia Plan: Spinal   Post-op Pain Management:    Induction: Intravenous  PONV Risk Score and Plan: 3 and Propofol infusion, Ondansetron and Dexamethasone  Airway Management Planned: Oral ETT and Natural Airway  Additional Equipment: None  Intra-op Plan:   Post-operative Plan: Extubation in OR  Informed Consent: I have reviewed the patients History and Physical, chart, labs and  discussed the procedure including the risks, benefits and alternatives for the proposed anesthesia with the patient or authorized representative who has indicated his/her understanding and acceptance.     Dental Advisory Given  Plan Discussed with: Anesthesiologist, CRNA and Surgeon  Anesthesia Plan Comments: (Patient hypoglycemic today, fingerstick = 36. Given half amp D50, feels better, glucose improved. Per patient, she has been off her Plavix for at least 10 days.  Discussed R/B/A of neuraxial anesthesia technique with patient: - rare risks of spinal/epidural hematoma, nerve damage, infection - Risk of PDPH - Risk of nausea and vomiting - Risk of aspiration with native airway (given controlled symptoms despite lack of nexxium, reasonable to proceed with native airway) - Risk of conversion to general anesthesia and its associated risks, including sore throat, damage to lips/teeth/oropharynx, and rare risks such as cardiac and respiratory and neurological events.  Patient counseled on being higher risk for anesthesia due to comorbidities: vascular disease, CAD, pulm HTN, morbid obesity. Patient was told about increased risk of cardiac and respiratory events, including death. Patient understands.  Patient voiced understanding.)       Anesthesia Quick Evaluation

## 2019-09-27 NOTE — Transfer of Care (Signed)
Immediate Anesthesia Transfer of Care Note  Patient: Tina Hayes  Procedure(s) Performed: TOTAL KNEE ARTHROPLASTY (Left Knee)  Patient Location: PACU  Anesthesia Type:General and Spinal  Level of Consciousness: drowsy  Airway & Oxygen Therapy: Patient Spontanous Breathing  Post-op Assessment: Report given to RN and Post -op Vital signs reviewed and stable  Post vital signs: Reviewed and stable  Last Vitals:  Vitals Value Taken Time  BP 95/56 09/27/19 1040  Temp    Pulse 60 09/27/19 1040  Resp 13 09/27/19 1040  SpO2 100 % 09/27/19 1040  Vitals shown include unvalidated device data.  Last Pain:  Vitals:   09/27/19 0648  TempSrc: Tympanic  PainSc: 6          Complications: No apparent anesthesia complications

## 2019-09-27 NOTE — Progress Notes (Signed)
°   09/27/19 0700  Clinical Encounter Type  Visited With Patient  Visit Type Initial;Spiritual support  Referral From Chaplain  Consult/Referral To Chaplain  As chaplain approached patient, she heard patient say that she trust God and him. Chaplain entered the room and said this was a perfect time for me to come in. Chaplain asked patient if she could pray with her and she said yes. Chaplain prayed asking God to guide surgeon's hand and give patient a complete healing. Patient said "Amen." Chaplain will follow up once patient is out of surgery.

## 2019-09-27 NOTE — Evaluation (Signed)
Physical Therapy Evaluation Patient Details Name: Tina Hayes MRN: [id number] DOB: [birth year] Today's Date: 09/27/2019   History of Present Illness  Pt admitted for L TKR. 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  Clinical Impression  Pt is a pleasant 74 year old female who was admitted for L TKR. Pt performs bed mobility with cga, transfers with min assist, and ambulation with cga and RW. Pt demonstrates deficits with strength/ROM/mobility. Slight L knee buckling noted during ambulation, may benefit from KI if persists despite being able to perform 10 SLRs. Would benefit from skilled PT to address above deficits and promote optimal return to PLOF. Appears very motivated. Recommend transition to [place] upon discharge from acute hospitalization.     Follow Up Recommendations Home health PT;Supervision/Assistance - 24 hour    Equipment Recommendations  Rolling walker with 5" wheels;3in1 (PT)    Recommendations for Other Services       Precautions / Restrictions Precautions Precautions: Knee;Fall Precaution Booklet Issued: No Restrictions Weight Bearing Restrictions: Yes LLE Weight Bearing: Weight bearing as tolerated      Mobility  Bed Mobility Overal bed mobility: Needs Assistance Bed Mobility: Supine to Sit     Supine to sit: Min guard     General bed mobility comments: safe technique with upright posture. Able to scoot out towards EOB safely  Transfers Overall transfer level: Needs assistance Equipment used: Rolling walker (2 wheeled) Transfers: Sit to/from Stand Sit to Stand: Min assist         General transfer comment: slightly elevated bed required for successful standing. Needs rocking momentum due to weakness. Once standing, L knee in hyperextension.  Ambulation/Gait Ambulation/Gait assistance: Min guard;Min assist Gait Distance (Feet): 3 Feet Assistive device: Rolling walker (2 wheeled) Gait Pattern/deviations: Step-to pattern      General Gait Details: ambulated from bed to recliner with slow step to gait pattern. Slight L knee buckling needing increased B UE support on RW.  Stairs            Wheelchair Mobility    Modified Rankin (Stroke Patients Only)       Balance Overall balance assessment: Needs assistance Sitting-balance support: Feet supported Sitting balance-Leahy Scale: Good     Standing balance support: Bilateral upper extremity supported Standing balance-Leahy Scale: Good                               Pertinent Vitals/Pain Pain Assessment: 0-10 Pain Score: 3  Pain Location: L knee Pain Descriptors / Indicators: Operative site guarding Pain Intervention(s): Limited activity within patient's tolerance;Ice applied    Home Living Family/patient expects to be discharged to:: Private residence Living Arrangements: Children(son) Available Help at Discharge: Family;Available 24 hours/day Type of Home: House Home Access: Level entry     Home Layout: One level Home Equipment: Cane - single point Additional Comments: reports her previous RW broke, is requesting a new one    Prior Function Level of Independence: Needs assistance   Gait / Transfers Assistance Needed: ambulates with SPC. Reports no falls.     Comments: No home O2.     Hand Dominance        Extremity/Trunk Assessment   Upper Extremity Assessment Upper Extremity Assessment: Overall WFL for tasks assessed    Lower Extremity Assessment Lower Extremity Assessment: Generalized weakness(L LE grossly 3/5; R LE grossly 3+/5)       Communication   Communication: No difficulties  Cognition Arousal/Alertness: Awake/alert Behavior During Therapy: WFL for tasks assessed/performed Overall Cognitive Status: Within Functional Limits for tasks assessed                                        General Comments      Exercises Total Joint Exercises Goniometric ROM: L knee AROM 0-65  degrees Other Exercises Other Exercises: supine ther-ex performed on L LE including AP, quad sets, hip abd/add, and SLRs. All ther-ex performed x 10 reps with cga and safe technique   Assessment/Plan    PT Assessment Patient needs continued PT services  PT Problem List Decreased strength;Decreased balance;Decreased mobility;Pain;Decreased range of motion       PT Treatment Interventions DME instruction;Gait training;Therapeutic exercise    PT Goals (Current goals can be found in the Care Plan section)  Acute Rehab PT Goals Patient Stated Goal: to go home PT Goal Formulation: With patient Time For Goal Achievement: 10/11/19 Potential to Achieve Goals: Good    Frequency BID   Barriers to discharge        Co-evaluation               AM-PAC PT "6 Clicks" Mobility  Outcome Measure Help needed turning from your back to your side while in a flat bed without using bedrails?: None Help needed moving from lying on your back to sitting on the side of a flat bed without using bedrails?: None Help needed moving to and from a bed to a chair (including a wheelchair)?: A Little Help needed standing up from a chair using your arms (e.g., wheelchair or bedside chair)?: A Little Help needed to walk in hospital room?: A Little Help needed climbing 3-5 steps with a railing? : A Lot 6 Click Score: 19    End of Session Equipment Utilized During Treatment: Gait belt Activity Tolerance: Patient tolerated treatment well Patient left: in chair;with chair alarm set;with SCD's reapplied Nurse Communication: Mobility status PT Visit Diagnosis: Unsteadiness on feet (R26.81);Muscle weakness (generalized) (M62.81);Difficulty in walking, not elsewhere classified (R26.2);Pain Pain - Right/Left: Left Pain - part of body: Knee    Time: [id number] PT Time Calculation (min) (ACUTE ONLY): 27 min   Charges:   PT Evaluation $PT Eval Low Complexity: 1 Low PT Treatments $Therapeutic Exercise: 8-22  mins        Greggory Stallion, PT, DPT [phone]   Ashly Goethe 09/27/2019, 4:42 PM

## 2019-09-27 NOTE — Anesthesia Procedure Notes (Signed)
Spinal  Patient location during procedure: OR Start time: 09/27/2019 7:38 AM End time: 09/27/2019 7:57 AM Staffing Performed: other anesthesia staff  Anesthesiologist: Arita Miss, MD Resident/CRNA: Lia Foyer, CRNA Other anesthesia staff: Emmie Niemann, MD Preanesthetic Checklist Completed: patient identified, IV checked, site marked, risks and benefits discussed, surgical consent, monitors and equipment checked, pre-op evaluation and timeout performed Spinal Block Patient position: sitting Prep: DuraPrep Patient monitoring: heart rate, cardiac monitor, continuous pulse ox and blood pressure Approach: midline Location: L3-4 Injection technique: single-shot Needle Needle type: Pencan  Needle gauge: 24 G Needle length: 9 cm Assessment Sensory level: T4 Additional Notes Attempt x 1 CRNA, x 1 Dr. Bertell Maria, x1 Dr. Randa Lynn

## 2019-09-27 NOTE — H&P (Signed)
Paper H&P to be scanned into permanent record. H&P reviewed and patient re-examined. No changes. 

## 2019-09-27 NOTE — Op Note (Signed)
09/27/2019  10:50 AM  Patient:   Tina Hayes  Pre-Op Diagnosis:   Degenerative joint disease, left knee.  Post-Op Diagnosis:   Same  Procedure:   Left TKA using a Signature-guided all-cemented Bi[employer] Vanguard system with a 65 mm PCR femur, a 75 mm tibial tray with an 18 mm anterior stabilized E-poly insert, and a 31 x 8 mm all-poly 3-pegged domed patella.  Surgeon:   Pascal Lux, MD  Assistant:   Cameron Proud, PA-C   Anesthesia:   Spinal  Findings:   As above  Complications:   None  EBL:   5 cc  Fluids:   1200 cc crystalloid  UOP:   None  TT:   110 minutes at 300 mmHg  Drains:   None  Closure:   Staples  Implants:   As above  Brief Clinical Note:   The patient is a 74 year old female with a long history of progressively worsening left knee pain. The patient's symptoms have progressed despite medications, activity modification, injections, etc. The patient's history and examination were consistent with advanced degenerative joint disease of the left knee confirmed by plain radiographs. The patient presents at this time for a left total knee arthroplasty.  Procedure:   The patient was brought into the operating room. After adequate spinal anesthesia was obtained, the patient was lain in the supine position.  The left lower extremity was prepped with ChloraPrep solution and draped sterilely. Preoperative antibiotics were administered. After verifying the proper laterality with a surgical timeout, the limb was exsanguinated with an Esmarch and the tourniquet inflated to 300 mmHg. A standard anterior approach to the knee was made through an approximately 7 inch incision. The incision was carried down through the subcutaneous tissues to expose superficial retinaculum. This was split the length of the incision and the medial flap elevated sufficiently to expose the medial retinaculum. The medial retinaculum was incised, leaving a 3-4 mm cuff of tissue on the patella. This was  extended distally along the medial border of the patellar tendon and proximally through the medial third of the quadriceps tendon. A subtotal fat pad excision was performed before the soft tissues were elevated off the anteromedial and anterolateral aspects of the proximal tibia to the level of the collateral ligaments. The anterior portions of the medial and lateral menisci were removed, as was the anterior cruciate ligament.   With the knee flexed to 90, the femoral Signature guided was positioned and the appropriate guide pins placed anteriorly. In addition, 2 pin holes were placed distally as directed by the guide. The distal cutting block was positioned and, using the 9 mm cutting slot, the distal cut was made. The 4-in-1 cutting block was positioned utilizing the 2 distal holes, and first the posterior, the posterior chamfer, the anterior, and finally the anterior chamfer cuts were made.    Attention was directed to the proximal tibia. Again, utilizing the tibial Signature guide, the guide pins were placed anteriorly. The tibial cutting block was positioned and the appropriate proximal tibial cut made. This piece was taken to the back table where it was measured and found to be optimally replicated by a 75 mm component, as anticipated by the preoperative plan. At this point, the posterior portions medial and lateral menisci were removed.   A trial reduction was performed using the appropriate femoral and tibial components with first the 10 mm, then the 14 mm, and finally the 18 mm insert. The 18 mm insert demonstrated excellent stability to  varus and valgus stressing both in flexion and extension while permitting full extension. Patella tracking was assessed and found to be excellent. Therefore, the tibial guide position was marked on the proximal tibia. The patella thickness was measured and found to be 21 mm. Therefore, the appropriate cut was made. The patellar surface was measured and found to be  optimally replicated by the 31 mm component. The three peg holes were drilled in place before the trial button was inserted. Patella tracking was assessed and found to be excellent, passing the "no thumb test". The lug holes were drilled into the distal femur before the trial component was removed, leaving only the tibial tray. The keel was then created using the appropriate tower, reamer, and punch.  The bony surfaces were prepared for cementing by irrigating them thoroughly with bacitracin saline solution via the jet lavage system. A bone plug was fashioned from some of the bone that had been removed previously and used to plug the distal femoral canal. In addition, 20 cc of Exparel diluted out to 60 cc with normal saline and 30 cc of 0.5% Sensorcaine were injected into the postero-medial and postero-lateral aspects of the knee, the medial and lateral gutter regions, and the peri-incisional tissues to help with postoperative analgesia. Meanwhile, the cement was being mixed on the back table. When it was ready, the tibial tray was cemented in first. The excess cement was removed using [employer] fast streamer. Next, the femoral component was impacted into place. Again, the excess cement was removed using [employer] fast streamer. The 18 mm trial insert was positioned and the knee brought into extension while the cement hardened. Finally, the patella was cemented into place and secured using the patellar clamp. Again, the excess cement was removed using [employer] fast streamer. Once the cement had hardened, the knee was placed through a range of motion with the findings as described above. Therefore, the trial insert was removed and, after verifying that no cement had been retained posteriorly, the permanent 18 mm anterior stabilized E-polyethylene insert was positioned and secured using the appropriate key locking mechanism. Again the knee was placed through a range of motion with the findings as described above.  The wound was  copiously irrigated with sterile saline solution using the jet lavage system before the quadriceps tendon and retinacular layer were reapproximated using #0 Vicryl interrupted sutures. The superficial retinacular layer also was closed using a running #0 Vicryl suture. A total of 10 cc of transexemic acid (TXA) was injected intra-articularly before the subcutaneous tissues were closed in several layers using 2-0 Vicryl interrupted sutures. The skin was closed using staples. A sterile honeycomb dressing was applied to the skin before the leg was wrapped with an Ace wrap to accommodate the Polar Care device. The patient was then awakened and returned to the recovery room in satisfactory condition after tolerating the procedure well.

## 2019-09-28 LAB — POTASSIUM: Potassium: 3.2 mmol/L — ABNORMAL LOW (ref 3.5–5.1)

## 2019-09-28 LAB — GLUCOSE, CAPILLARY
Glucose-Capillary: 137 mg/dL — ABNORMAL HIGH (ref 70–99)
Glucose-Capillary: 158 mg/dL — ABNORMAL HIGH (ref 70–99)
Glucose-Capillary: 77 mg/dL (ref 70–99)
Glucose-Capillary: 41 mg/dL — CL (ref 70–99)
Glucose-Capillary: 58 mg/dL — ABNORMAL LOW (ref 70–99)
Glucose-Capillary: 154 mg/dL — ABNORMAL HIGH (ref 70–99)

## 2019-09-28 LAB — BASIC METABOLIC PANEL
Anion gap: 10 (ref 5–15)
BUN: 13 mg/dL (ref 8–23)
CO2: 28 mmol/L (ref 22–32)
Calcium: 8.1 mg/dL — ABNORMAL LOW (ref 8.9–10.3)
Chloride: 105 mmol/L (ref 98–111)
Creatinine, Ser: 0.97 mg/dL (ref 0.44–1.00)
GFR calc Af Amer: >60 mL/min (ref >60)
GFR calc non Af Amer: 59 mL/min — ABNORMAL LOW (ref >60)
Glucose, Bld: 176 mg/dL — ABNORMAL HIGH (ref 70–99)
Potassium: 3 mmol/L — ABNORMAL LOW (ref 3.5–5.1)
Sodium: 143 mmol/L (ref 135–145)

## 2019-09-28 LAB — CBC
HCT: 28.1 % — ABNORMAL LOW (ref 36.0–46.0)
Hemoglobin: 8.7 g/dL — ABNORMAL LOW (ref 12.0–15.0)
MCH: 26.6 pg (ref 26.0–34.0)
MCHC: 31 g/dL (ref 30.0–36.0)
MCV: 85.9 fL (ref 80.0–100.0)
Platelets: 217 10*3/uL (ref 150–400)
RBC: 3.27 MIL/uL — ABNORMAL LOW (ref 3.87–5.11)
RDW: 15.8 % — ABNORMAL HIGH (ref 11.5–15.5)
WBC: 6.2 10*3/uL (ref 4.0–10.5)
nRBC: 0 % (ref 0.0–0.2)

## 2019-09-28 MED ORDER — INSULIN ASPART 100 UNIT/ML ~~LOC~~ SOLN
0.0000 [IU] | Freq: Three times a day (TID) | SUBCUTANEOUS | Status: DC
  Administered 2019-09-28 – 2019-09-29 (×2): 3 [IU] via SUBCUTANEOUS
  Administered 2019-09-29: 2 [IU] via SUBCUTANEOUS
  Administered 2019-09-30: 5 [IU] via SUBCUTANEOUS
  Administered 2019-09-30: 3 [IU] via SUBCUTANEOUS
  Administered 2019-10-01: 5 [IU] via SUBCUTANEOUS
  Administered 2019-10-01 (×2): 3 [IU] via SUBCUTANEOUS
  Administered 2019-10-02: 5 [IU] via SUBCUTANEOUS
  Administered 2019-10-02 (×2): 2 [IU] via SUBCUTANEOUS
  Administered 2019-10-03: 3 [IU] via SUBCUTANEOUS
  Administered 2019-10-03: 5 [IU] via SUBCUTANEOUS
  Administered 2019-10-03 – 2019-10-04 (×2): 3 [IU] via SUBCUTANEOUS
  Administered 2019-10-04: 5 [IU] via SUBCUTANEOUS
  Filled 2019-09-28 (×16): qty 1

## 2019-09-28 MED ORDER — DEXTROSE 50 % IV SOLN
INTRAVENOUS | Status: AC
  Filled 2019-09-28: qty 50

## 2019-09-28 MED ORDER — DEXTROSE 50 % IV SOLN
1.0000 | Freq: Once | INTRAVENOUS | Status: AC
  Administered 2019-09-28: 50 mL via INTRAVENOUS

## 2019-09-28 MED ORDER — ENOXAPARIN SODIUM 40 MG/0.4ML ~~LOC~~ SOLN
40.0000 mg | Freq: Two times a day (BID) | SUBCUTANEOUS | Status: DC
  Administered 2019-09-28 – 2019-10-04 (×13): 40 mg via SUBCUTANEOUS
  Filled 2019-09-28 (×13): qty 0.4

## 2019-09-28 MED ORDER — FAMOTIDINE 20 MG PO TABS
20.0000 mg | ORAL_TABLET | Freq: Two times a day (BID) | ORAL | Status: DC
  Administered 2019-09-28 (×2): 40 mg via ORAL
  Administered 2019-09-29: 20 mg via ORAL
  Administered 2019-09-29 – 2019-10-01 (×3): 40 mg via ORAL
  Administered 2019-10-01 – 2019-10-03 (×4): 20 mg via ORAL
  Administered 2019-10-03 – 2019-10-04 (×2): 40 mg via ORAL
  Filled 2019-09-28 (×6): qty 2
  Filled 2019-09-28: qty 1
  Filled 2019-09-28: qty 2
  Filled 2019-09-28 (×2): qty 1
  Filled 2019-09-28: qty 2
  Filled 2019-09-28 (×2): qty 1

## 2019-09-28 MED ORDER — POTASSIUM CHLORIDE 20 MEQ PO PACK
20.0000 meq | PACK | Freq: Once | ORAL | Status: AC
  Administered 2019-09-28: 20 meq via ORAL
  Filled 2019-09-28: qty 1

## 2019-09-28 MED ORDER — SODIUM CHLORIDE 0.9 % IV BOLUS
500.0000 mL | Freq: Once | INTRAVENOUS | Status: AC
  Administered 2019-09-28: 500 mL via INTRAVENOUS

## 2019-09-28 NOTE — TOC Progression Note (Signed)
Transition of Care [facility]) - Progression Note    Patient Details  Name: Tina Hayes MRN: [id number] Date of Birth: Dec 10, 1945  Transition of Care [facility]) CM/SW [place], RN Phone Number: 09/28/2019, 9:40 AM  Clinical Narrative:    Requested the price of Lovenox 40 mg and will notify the patient once obtained        Expected Discharge Plan and Services                                                 Social Determinants of Health (SDOH) Interventions    Readmission Risk Interventions No flowsheet data found.

## 2019-09-28 NOTE — TOC Benefit Eligibility Note (Signed)
Transition of Care [facility]) Benefit Eligibility Note    Patient Details  Name: Tina Hayes MRN: [id number] Date of Birth: 14-May-1945   Medication/Dose: ENOXAPARIN 40 MG DAILY  SQ X 14 DAYS    Q/L  11 SYRINGES FOR 28 DAYS  Covered?: Yes  Tier: (TIER- 4 DRUG)  Prescription Coverage Preferred Pharmacy: [facility] with Person/Company/Phone Number:: JQBHAL  @ [employer] # [phone]  Co-Pay: [place] [employer]  Prior Approval: [phone])  Deductible: Met  Additional Notes: LOVENOX  40 MG  DAILY SQ X 14 DAYS   NOT COVER   PRIOR APPROVAL- YES # [phone]    Memory Argue Phone Number: 09/28/2019, 12:31 PM

## 2019-09-28 NOTE — Progress Notes (Signed)
Physical Therapy Treatment Patient Details Name: Tina Hayes MRN: [id number] DOB: [birth year] Today's Date: 09/28/2019    History of Present Illness Pt admitted for L TKR. 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    PT Comments    Pt is making gradual progress towards goals with ability to ambulate in room this date. 2nd person for safety. KI donned with improved stability. Educated as long as she continues to make progress with ambulation in order to get around home, adamant about refusing rehab. Continues to be very motivated. Will continue to progress as able. Would benefit from chair follow for further ambulation distance.   Follow Up Recommendations  Home health PT;Supervision/Assistance - 24 hour     Equipment Recommendations  Rolling walker with 5" wheels;3in1 (PT)    Recommendations for Other Services       Precautions / Restrictions Precautions Precautions: Knee;Fall Precaution Booklet Issued: Yes (comment) Precaution Comments: KI on for mobility Required Braces or Orthoses: Knee Immobilizer - Left Knee Immobilizer - Left: On when out of bed or walking Restrictions Weight Bearing Restrictions: Yes LLE Weight Bearing: Weight bearing as tolerated    Mobility  Bed Mobility Overal bed mobility: Needs Assistance Bed Mobility: Supine to Sit     Supine to sit: Min assist     General bed mobility comments: Min A to bring LLE over EOB.  Transfers Overall transfer level: Needs assistance Equipment used: Rolling walker (2 wheeled) Transfers: Sit to/from Stand Sit to Stand: Min assist;Min guard         General transfer comment: Min A to come to stand from elevated bed. Close min guard & +2 for mgt of lines/leads.  Ambulation/Gait Ambulation/Gait assistance: Min guard;Min assist Gait Distance (Feet): 10 Feet Assistive device: Rolling walker (2 wheeled) Gait Pattern/deviations: Step-to pattern     General Gait Details: ambulated in room  using [place]. CGA with close 2nd person for safety. Improved technique with KI donned with improved stability noted.    Stairs             Wheelchair Mobility    Modified Rankin (Stroke Patients Only)       Balance Overall balance assessment: Needs assistance Sitting-balance support: Feet supported;No upper extremity supported Sitting balance-Leahy Scale: Good Sitting balance - Comments: Steady static sitting, reaching within BOS.   Standing balance support: Bilateral upper extremity supported Standing balance-Leahy Scale: Fair Standing balance comment: Requires heavy BUE support on RW.                            Cognition Arousal/Alertness: Awake/alert Behavior During Therapy: WFL for tasks assessed/performed Overall Cognitive Status: Within Functional Limits for tasks assessed                                        Exercises Total Joint Exercises Goniometric ROM: L knee flexion approx 70 degrees Other Exercises Other Exercises: supine ther-ex performed on L LE including AP, quad sets, SAQ, hip abd/add, and SLRs. ALl ther-ex performed x 12 reps with cga.  Other Exercises: Pt educated on falls prevention strategies, safe use of AE/DME for ADL management, polar care management, and routines modifications to support safety and fxl independence upon hospital DC.    General Comments        Pertinent Vitals/Pain Pain Assessment: 0-10 Pain Score: 5  Pain  Location: L knee Pain Descriptors / Indicators: Operative site guarding Pain Intervention(s): Limited activity within patient's tolerance;Repositioned;Ice applied    Home Living Family/patient expects to be discharged to:: Private residence Living Arrangements: Children Available Help at Discharge: Family;Available 24 hours/day(Son and 3 grandchildren at home.) Type of Home: House Home Access: Level entry   Home Layout: One level Home Equipment: Cane - single point Additional Comments:  reports her previous RW broke, is requesting a new one    Prior Function Level of Independence: Needs assistance  Gait / Transfers Assistance Needed: ambulates with SPC. Reports no falls. ADL's / Homemaking Assistance Needed: Pt reports she is independent with BADL management. Son assists with some IADL tasks including cooking. Walks up to 1 mile to get groceries.     PT Goals (current goals can now be found in the care plan section) Acute Rehab PT Goals Patient Stated Goal: to go home PT Goal Formulation: With patient Time For Goal Achievement: 10/11/19 Potential to Achieve Goals: Good Progress towards PT goals: Progressing toward goals    Frequency    BID      PT Plan Current plan remains appropriate    Co-evaluation PT/OT/SLP Co-Evaluation/Treatment: Yes Reason for Co-Treatment: For patient/therapist safety;To address functional/ADL transfers PT goals addressed during session: Mobility/safety with mobility;Proper use of DME OT goals addressed during session: ADL's and self-care;Proper use of Adaptive equipment and DME      AM-PAC PT "6 Clicks" Mobility   Outcome Measure  Help needed turning from your back to your side while in a flat bed without using bedrails?: None Help needed moving from lying on your back to sitting on the side of a flat bed without using bedrails?: None Help needed moving to and from a bed to a chair (including a wheelchair)?: A Little Help needed standing up from a chair using your arms (e.g., wheelchair or bedside chair)?: A Little Help needed to walk in hospital room?: A Little Help needed climbing 3-5 steps with a railing? : A Lot 6 Click Score: 19    End of Session Equipment Utilized During Treatment: Gait belt Activity Tolerance: Patient tolerated treatment well Patient left: in chair;with chair alarm set;with SCD's reapplied Nurse Communication: Mobility status PT Visit Diagnosis: Unsteadiness on feet (R26.81);Muscle weakness  (generalized) (M62.81);Difficulty in walking, not elsewhere classified (R26.2);Pain Pain - Right/Left: Left Pain - part of body: Knee     Time: [id number] PT Time Calculation (min) (ACUTE ONLY): 38 min  Charges:  $Gait Training: 23-37 mins $Therapeutic Exercise: 23-37 mins                     Greggory Stallion, PT, DPT [phone]    Threasa Kinch 09/28/2019, 4:36 PM

## 2019-09-28 NOTE — TOC Initial Note (Signed)
Transition of Care [facility]) - Initial/Assessment Note    Patient Details  Name: Tina Hayes MRN: [id number] Date of Birth: [birth year]  Transition of Care [facility]) CM/SW Contact:    Su Hilt, RN Phone Number: 09/28/2019, 3:57 PM  Clinical Narrative:                 Met with the patient to discuss DC plan and needs She lives at home with her son She has a cane at home and a broken RW that she has had more than 5 years I notified Zack with [employer] that she will need a new walker and a 3 in 1 She is set up by the doctors office with [facility], the patient stated that she has had [place] services He son provides transportation Will continue to monitor for needs   Expected Discharge Plan: [place] Barriers to Discharge: Continued Medical Work up   Patient Goals and CMS Choice Patient states their goals for this hospitalization and ongoing recovery are:: go home      Expected Discharge Plan and Services Expected Discharge Plan: [place]   Discharge Planning Services: CM Consult   Living arrangements for the past 2 months: Single Family Home                 DME Arranged: 3-N-1, Walker rolling DME Agency: [employer] Date DME Agency Contacted: 09/28/19 Time DME Agency Contacted: [phone] Representative spoke with at DME Agency: Zack HH Arranged: PT [place]: [facility] (formerly [employer]) Date [place]: 09/28/19 Time [place]: 80 Representative spoke with at [place]: Helene Kelp  Prior Living Arrangements/Services Living arrangements for the past 2 months: [place] Lives with:: Spouse Patient language and need for interpreter reviewed:: Yes Do you feel safe going back to the place where you live?: Yes      Need for Family Participation in Patient Care: No (Comment)   Current home services: DME(broken RW) Criminal Activity/Legal Involvement Pertinent to Current  Situation/Hospitalization: No - Comment as needed  Activities of Daily Living Home Assistive Devices/Equipment: Cane (specify quad or straight) ADL Screening (condition at time of admission) Patient's cognitive ability adequate to safely complete daily activities?: Yes Is the patient deaf or have difficulty hearing?: No Does the patient have difficulty seeing, even when wearing glasses/contacts?: No Does the patient have difficulty concentrating, remembering, or making decisions?: No Patient able to express need for assistance with ADLs?: Yes Does the patient have difficulty dressing or bathing?: No Independently performs ADLs?: Yes (appropriate for developmental age) Does the patient have difficulty walking or climbing stairs?: Yes Weakness of Legs: Both Weakness of Arms/Hands: None  Permission Sought/Granted   Permission granted to share information with : Yes, Verbal Permission Granted              Emotional Assessment Appearance:: Appears stated age Attitude/Demeanor/Rapport: Engaged Affect (typically observed): Appropriate Orientation: : Oriented to Situation, Oriented to  Time, Oriented to Place, Oriented to Self Alcohol / Substance Use: Not Applicable Psych Involvement: No (comment)  Admission diagnosis:  Status post total knee replacement using cement, left [Z96.652] Patient Active Problem List   Diagnosis Date Noted  . Status post total knee replacement using cement, 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  . Atherosclerosis of native arteries of the extremities with ulceration ([place]) 07/05/2019  . Onychomycosis 05/30/2019  . Occlusion and stenosis of vertebral artery 08/31/2018  . AKI (acute kidney injury) ([place]) 05/25/2018  .  BMI 50.0-59.9, adult ([place]) 04/16/2018  . Seizures ([place]) 11/10/2017  . Mass of right lower leg 09/11/2017  . Dysphagia 04/23/2017  . Chronic diastolic congestive heart failure ([place]) 02/13/2017  . Acute diastolic heart failure ([place]) 01/27/2017  . Depression  12/11/2016  . Essential hypertension 06/30/2016  . Snoring 06/30/2016  . Diabetes mellitus ([place]) 06/30/2016  . Coronary artery disease of native artery of native heart with stable angina pectoris ([place]) 06/17/2016  . Morbid obesity ([place]) 06/17/2016  . COPD (chronic obstructive pulmonary disease) ([place]) 08/28/2015  . COPD with acute exacerbation ([place]) 04/14/2015   PCP:  Herminio Commons, MD Pharmacy:   [facility] [address] [place] [address] [place]) [place] [address] Phone: [phone] Fax: [facility] [place], [place] [address] Phone: [phone] Fax: [phone]     Social Determinants of Health (SDOH) Interventions    Readmission Risk Interventions No flowsheet data found.

## 2019-09-28 NOTE — TOC Progression Note (Signed)
Transition of Care [facility]) - Progression Note    Patient Details  Name: Tina Hayes MRN: [id number] Date of Birth: 08-Nov-1945  Transition of Care [facility]) CM/SW [place], RN Phone Number: 09/28/2019, 9:42 AM  Clinical Narrative:    Requested the price of Lovenox, will notify the patient once obtained, she is set up with [facility]        Expected Discharge Plan and Services                                                 Social Determinants of Health (SDOH) Interventions    Readmission Risk Interventions No flowsheet data found.

## 2019-09-28 NOTE — Progress Notes (Signed)
Inpatient Diabetes Program Recommendations  AACE/ADA: New Consensus Statement on Inpatient Glycemic Control (2015)  Target Ranges:  Prepandial:   less than 140 mg/dL      Peak postprandial:   less than 180 mg/dL (1-2 hours)      Critically ill patients:  140 - 180 mg/dL   Lab Results  Component Value Date   GLUCAP 158 (H) 09/27/2019   HGBA1C 6.5 (H) 01/18/2019    Review of Glycemic Control  Diabetes history: DM2 Outpatient Diabetes medications: Lantus 30 units am + 40 units pm + Novolog 10 units tid meal coverage + Jardiance Current orders for Inpatient glycemic control: Lantus 30 units am + 40 units pm + Novolog resistant correction tid  Inpatient Diabetes Program Recommendations:   Received consult regarding diabetes medication management post-op. Consider: -Decrease Novolog correction to moderate -Add meal coverage 3 units if postprandial becomes elevated  Thank you, Bethena Roys E. Joellyn Grandt, RN, MSN, CDE  Diabetes Coordinator Inpatient Glycemic Control Team Team Pager [phone] (8am-5pm) 09/28/2019 10:47 AM

## 2019-09-28 NOTE — Progress Notes (Signed)
PHARMACIST - PHYSICIAN COMMUNICATION  CONCERNING:  Enoxaparin (Lovenox) for DVT Prophylaxis    RECOMMENDATION: Patient was prescribed enoxaprin 40mg  q24 hours for VTE prophylaxis.   Filed Weights   09/27/19 0653  Weight: 132.5 kg (292 lb)    Body mass index is 40.73 kg/m.  Estimated Creatinine Clearance: 80.2 mL/min (by C-G formula based on SCr of 0.97 mg/dL).   Based on [place] patient is candidate for enoxaparin 40mg  every 12 hour dosing due to BMI being >40.  DESCRIPTION: Pharmacy has adjusted enoxaparin dose per [facility] policy.  Patient is now receiving enoxaparin 40mg  every 12 hours.    Lu Duffel, PharmD, BCPS Clinical Pharmacist 09/28/2019 9:12 AM

## 2019-09-28 NOTE — Progress Notes (Signed)
   09/28/19 1500  Clinical Encounter Type  Visited With Patient  Visit Type Follow-up;Spiritual support  Referral From Chaplain  Consult/Referral To Chaplain  Chaplain checked in on patient and found her sitting up in the chair. Patient said that she feels much better. Nurse came in and apologized for interrupting and patient said don't run my angel away. Patient is upbeat and has a positive attitude. She has faith and confidence in God to continue keeping her. Chaplain prayed and when she finished patient prayed. Chaplain will follow up tomorrow.

## 2019-09-28 NOTE — Progress Notes (Signed)
Initial Nutrition Assessment  DOCUMENTATION CODES:   Morbid obesity  INTERVENTION:  Recommend Ensure Max po daily, each supplement provides 150 kcal and 30 grams protein  Recommend Ensure Enlive po daily, each supplement provides 350 kcal and 20 grams of protein  Recommend 1 packet Juven BID, each packet provides 95 calories, 2.5 grams of protein (collagen), and 9.8 grams of carbohydrate (3 grams sugar); also contains 7 grams of L-arginine and L-glutamine, 300 mg vitamin C, 15 mg vitamin E, 1.2 mcg vitamin B-12, 9.5 mg zinc, 200 mg calcium, and 1.5 g  Calcium Beta-hydroxy-Beta-methylbutyrate to support wound healing  Recommend liberalizing diet to CM   NUTRITION DIAGNOSIS:   Increased nutrient needs related to post-op healing as evidenced by estimated needs.  GOAL:   Patient will meet greater than or equal to 90% of their needs    MONITOR:   Diet advancement, Supplement acceptance, PO intake, Labs, Weight trends, Skin  REASON FOR ASSESSMENT:   Malnutrition Screening Tool    ASSESSMENT:  RD working remotely.   74 year old female presented for total left knee arthroplasty secondary to advanced degenerative joint disease. Past medical history significant for diastolic heart failure, COPD, CAD s/p CABG x 3, DM2, GERD, HTN, and depression.  Patient is post-op day 1 left total knee arthroplasty.   Per chart, she reports moderate pain as well as nausea with vomiting overnight. Patient with poor po intake, per flowsheets she is consuming 40-50% x 2 documented meals. Patient current diet order is restrictive of protein, recommend liberalizing heart healthy portion. She would benefit from nutrition supplements given increased needs related to post-op healing and would recommend Ensure Max and Juven supplements.   Current wt 291.5 lb Per history, weights have been trending down over the past 8 months. On 02/16/19 pt weighed 306.9 lb, on 03/23/19 she weighed 301.84 lb, on 05/12/19 pt  weighed 301.4 lb, on 07/05/19 she weighed 299.42 lb, on 08/22/19 she weighed 290.4 lbs. This indicates a 11.66 lb (3.8%) wt loss in 5 months which is insignificant.  Medications reviewed and include: Colace, Ferrous sulfate, SSI, Lantus 30 units every morning, Lantus 40 untis at bedtime, Protonix, Klor-con IVF: NaCl Labs: CBGs 77,158,137,158,92, K 3.2 (L)   NUTRITION - FOCUSED PHYSICAL EXAM: Unable to complete at this time, RD working remotely.  Diet Order:   Diet Order            Diet heart healthy/carb modified Room service appropriate? Yes; Fluid consistency: Thin  Diet effective now              EDUCATION NEEDS:   No education needs have been identified at this time  Skin:  Skin Assessment: Skin Integrity Issues: Skin Integrity Issues:: Incisions Incisions: closed; left knee  Last BM:  6/1  Height:   Ht Readings from Last 1 Encounters:  08/22/19 5\' 11"  (1.803 m)    Weight:   Wt Readings from Last 1 Encounters:  09/27/19 132.5 kg   BMI:  Body mass index is 40.73 kg/m.  Estimated Nutritional Needs:   Kcal:  2325-2500  Protein:  113-120  Fluid:  >/= 2 L/day   Lajuan Lines, RD, LDN Clinical Nutrition After Hours/Weekend Pager # in [place]

## 2019-09-28 NOTE — Anesthesia Postprocedure Evaluation (Signed)
Anesthesia Post Note  Patient: Tina Hayes  Procedure(s) Performed: TOTAL KNEE ARTHROPLASTY (Left Knee)  Patient location during evaluation: Nursing Unit Anesthesia Type: Spinal Level of consciousness: oriented and awake and alert Pain management: pain level controlled Vital Signs Assessment: post-procedure vital signs reviewed and stable Respiratory status: spontaneous breathing and respiratory function stable Cardiovascular status: blood pressure returned to baseline and stable Postop Assessment: no headache, no backache, no apparent nausea or vomiting and patient able to bend at knees Anesthetic complications: no     Last Vitals:  Vitals:   09/28/19 0741 09/28/19 0825  BP: (!) 96/59   Pulse: 65   Resp: 16   Temp: 36.8 C   SpO2: 100% 96%    Last Pain:  Vitals:   09/28/19 0741  TempSrc: Oral  PainSc:                  [name]

## 2019-09-28 NOTE — Evaluation (Signed)
Occupational Therapy Evaluation Patient Details Name: Tina Hayes MRN: [id number] DOB: 1945-05-17 Today's Date: 09/28/2019    History of Present Illness Pt admitted for L TKR. 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   Clinical Impression   Tina Hayes was seen for OT evaluation this date, POD#1 from above surgery. Pt was independent in all ADLs prior to surgery. She reports living with her adult son and her 60 young grandchildren. Pt is eager to return to PLOF with less pain and improved safety and independence. Pt currently requires minimal assist for LB dressing while in seated position due to pain and limited AROM of L knee. Pt instructed in polar care mgt, falls prevention strategies, home/routines modifications, DME/AE for LB bathing and dressing tasks, and compression stocking mgt. Pt would benefit from skilled OT services including additional instruction in dressing techniques with or without assistive devices for dressing and bathing skills to support recall and carryover prior to discharge and ultimately to maximize safety, independence, and minimize falls risk and caregiver burden. Recommend HHOT upon discharge to maximize pt safety and return to functional independence during meaningful occupations of daily life.      Follow Up Recommendations  Home health OT;Supervision - Intermittent    Equipment Recommendations  3 in 1 bedside commode    Recommendations for Other Services       Precautions / Restrictions Precautions Precautions: Knee;Fall Precaution Booklet Issued: Yes (comment) Precaution Comments: KI on for mobility Required Braces or Orthoses: Knee Immobilizer - Left Knee Immobilizer - Left: On when out of bed or walking Restrictions Weight Bearing Restrictions: Yes LLE Weight Bearing: Weight bearing as tolerated      Mobility Bed Mobility Overal bed mobility: Needs Assistance Bed Mobility: Supine to Sit     Supine to sit: Min assist      General bed mobility comments: Min A to bring LLE over EOB.  Transfers Overall transfer level: Needs assistance Equipment used: Rolling walker (2 wheeled) Transfers: Sit to/from Stand Sit to Stand: Min assist;Min guard         General transfer comment: Min A to come to stand from elevated bed. Close min guard & +2 for mgt of lines/leads.    Balance Overall balance assessment: Needs assistance Sitting-balance support: Feet supported;No upper extremity supported Sitting balance-Leahy Scale: Good Sitting balance - Comments: Steady static sitting, reaching within BOS.   Standing balance support: Bilateral upper extremity supported Standing balance-Leahy Scale: Fair Standing balance comment: Requires heavy BUE support on RW.                           ADL either performed or assessed with clinical judgement   ADL Overall ADL's : Needs assistance/impaired                                       General ADL Comments: Pt functionally limited by decreased AROM and increased pain in her L knee. She requires MOD A for LB ADL in seated position as well as MIN A for functional mobility.     Vision Baseline Vision/History: Wears glasses Wears Glasses: At all times Patient Visual Report: No change from baseline       Perception     Praxis      Pertinent Vitals/Pain Pain Assessment: 0-10 Pain Score: 6  Pain Location: L knee Pain Descriptors /  Indicators: Operative site guarding Pain Intervention(s): Limited activity within patient's tolerance;Monitored during session;Repositioned;Utilized relaxation techniques;Ice applied     Hand Dominance Right   Extremity/Trunk Assessment Upper Extremity Assessment Upper Extremity Assessment: Overall WFL for tasks assessed   Lower Extremity Assessment Lower Extremity Assessment: LLE deficits/detail;Defer to PT evaluation LLE Deficits / Details: s/p L TKA LLE: Unable to fully assess due to immobilization LLE  Coordination: decreased gross motor       Communication Communication Communication: No difficulties   Cognition Arousal/Alertness: Awake/alert Behavior During Therapy: WFL for tasks assessed/performed Overall Cognitive Status: Within Functional Limits for tasks assessed                                     General Comments       Exercises Total Joint Exercises  Other Exercises: Pt educated on falls prevention strategies, safe use of AE/DME for ADL management, polar care management, and routines modifications to support safety and fxl independence upon hospital DC.   Shoulder Instructions      Home Living Family/patient expects to be discharged to:: Private residence Living Arrangements: Children Available Help at Discharge: Family;Available 24 hours/day(Son and 3 grandchildren at home.) Type of Home: House Home Access: Level entry     Home Layout: One level     Bathroom Shower/Tub: Teacher, early years/pre: Standard     Home Equipment: Cane - single point   Additional Comments: reports her previous RW broken, is requesting a new one      Prior Functioning/Environment Level of Independence: Needs assistance  Gait / Transfers Assistance Needed: ambulates with SPC. Reports no falls. ADL's / Homemaking Assistance Needed: Pt reports she is independent with BADL management. Son assists with some IADL tasks including cooking. Walks up to 1 mile to get groceries.            OT Problem List: Decreased strength;Decreased coordination;Decreased activity tolerance;Decreased safety awareness;Impaired balance (sitting and/or standing);Decreased knowledge of use of DME or AE;Decreased range of motion;Cardiopulmonary status limiting activity;Obesity      OT Treatment/Interventions: Self-care/ADL training;Therapeutic exercise;Therapeutic activities;DME and/or AE instruction;Patient/family education;Energy conservation;Balance training    OT  Goals(Current goals can be found in the care plan section) Acute Rehab OT Goals Patient Stated Goal: to go home OT Goal Formulation: With patient Time For Goal Achievement: 10/12/19 Potential to Achieve Goals: Good ADL Goals Pt Will Perform Grooming: sitting;with modified independence Pt Will Perform Lower Body Dressing: sit to/from stand;with set-up;with supervision;with caregiver independent in assisting;with adaptive equipment(c LRAD PRN for improved safety and fxl independence.) Pt Will Transfer to Toilet: bedside commode;ambulating;with set-up;with supervision(c LRAD PRN for improved safety and fxl independence.) Pt Will Perform Toileting - Clothing Manipulation and hygiene: with caregiver independent in assisting;with set-up;with supervision;sit to/from stand(c LRAD PRN for improved safety and fxl independence.)  OT Frequency: Min 1X/week   Barriers to D/C:            Co-evaluation PT/OT/SLP Co-Evaluation/Treatment: Yes Reason for Co-Treatment: For patient/therapist safety;To address functional/ADL transfers PT goals addressed during session: Mobility/safety with mobility;Balance;Proper use of DME;Strengthening/ROM OT goals addressed during session: ADL's and self-care;Proper use of Adaptive equipment and DME      AM-PAC OT "6 Clicks" Daily Activity     Outcome Measure Help from another person eating meals?: None Help from another person taking care of personal grooming?: A Little Help from another person toileting, which includes using toliet, bedpan, or  urinal?: A Little Help from another person bathing (including washing, rinsing, drying)?: A Lot Help from another person to put on and taking off regular upper body clothing?: A Little Help from another person to put on and taking off regular lower body clothing?: A Lot 6 Click Score: 17   End of Session Equipment Utilized During Treatment: Gait belt;Rolling walker  Activity Tolerance: Patient tolerated treatment  well Patient left: in chair;with call bell/phone within reach;with chair alarm set;with SCD's reapplied;Other (comment)(polar care in place/active)  OT Visit Diagnosis: Other abnormalities of gait and mobility (R26.89);Pain Pain - Right/Left: Left Pain - part of body: Knee                Time: [id number] OT Time Calculation (min): 38 min Charges:  OT General Charges $OT Visit: 1 Visit OT Evaluation $OT Eval Moderate Complexity: 1 Mod OT Treatments $Self Care/Home Management : 8-22 mins  Shara Blazing, M.S., OTR/L Ascom: [phone] 09/28/19, 4:04 PM

## 2019-09-28 NOTE — Progress Notes (Signed)
Subjective: 1 Day Post-Op Procedure(s) (LRB): TOTAL KNEE ARTHROPLASTY (Left) Patient reports pain as moderate.   Patient is well, and has had no acute complaints or problems PT and care management this AM. Negative for chest pain and shortness of breath Fever: no Gastrointestinal:Negative for nausea and vomiting this morning but does report nausea last night with some vomiting.  Objective: Vital signs in last 24 hours: Temp:  [97.2 F (36.2 C)-98.9 F (37.2 C)] 98.9 F (37.2 C) (06/02 0533) Pulse Rate:  [51-81] 78 (06/02 0533) Resp:  [13-22] 18 (06/02 0533) BP: (95-148)/(49-97) 140/82 (06/02 0533) SpO2:  [97 %-100 %] 100 % (06/02 0533)  Intake/Output from previous day:  Intake/Output Summary (Last 24 hours) at 09/28/2019 0738 Last data filed at 09/28/2019 [id number] Gross per 24 hour  Intake 2225 ml  Output 5305 ml  Net -3080 ml    Intake/Output this shift: No intake/output data recorded.  Labs: Recent Labs    09/28/19 0532  HGB 8.7*   Recent Labs    09/28/19 0532  WBC 6.2  RBC 3.27*  HCT 28.1*  PLT 217   Recent Labs    09/27/19 0657 09/28/19 0532  NA  --  143  K  --  3.0*  CL  --  105  CO2  --  28  BUN  --  13  CREATININE  --  0.97  GLUCOSE 94 176*  CALCIUM  --  8.1*   No results for input(s): LABPT, INR in the last 72 hours.   EXAM General - Patient is Alert, Appropriate and Oriented Extremity - ABD soft Sensation intact distally Intact pulses distally Dorsiflexion/Plantar flexion intact Incision: dressing C/D/I No cellulitis present Dressing/Incision - clean, dry, no drainage Motor Function - intact, moving foot and toes well on exam.  Tolerating CPM this AM.  Past Medical History:  Diagnosis Date  . (HFpEF) heart failure with preserved ejection fraction ([place])    a. 05/2016 Echo: EF 60-65%, mild to mod LVH, Gr1 DD, mild MR, mildly dil LA, mod TR, mildly to mod increased PASP.  [name] Acute diastolic heart failure ([place]) 01/27/2017  . Anxiety   .  Arthritis   . Chest pain 06/16/2016  . CHF (congestive heart failure) ([place])   . Chronic back pain   . Chronic diastolic congestive heart failure ([place]) 02/13/2017  . COPD (chronic obstructive pulmonary disease) ([place])   . Coronary artery disease    a. s/p remote PCI x 5;  b. 2006 s/p CABG x 3 ([place], [place]); b. 05/2016 MV: attenuation corrected images w/o ischemia or wma-->Med rx.  . Coronary artery disease of native artery of native heart with stable angina pectoris ([place]) 06/17/2016  . Depression   . Diabetes mellitus without complication ([place])   . Essential hypertension 06/30/2016  . GERD (gastroesophageal reflux disease)   . Heart attack ([place])    Total of 3 per pt.  . Hypertension   . Hypertensive urgency 06/03/2015  . Seizure ([place])   . Seizures (H[facility])     Assessment/Plan: 1 Day Post-Op Procedure(s) (LRB): TOTAL KNEE ARTHROPLASTY (Left) Active Problems:   Status post total knee replacement using cement, left  Estimated body mass index is 40.73 kg/m as calculated from the following:   Height as of 08/22/19: 5\' 11"  (1.803 m).   Weight as of this encounter: 132.5 kg. Advance diet Up with therapy D/C IV fluids when tolerating po intake.  Labs reviewed.   K+ 3.0, already takes chronic Potassium, 40  mEq twice daily.  Added 20 mEq packet this morning. COntinue with therapy, current plan is for possible d/c home with HHPT. Patient is passing gas this morning.  Work on [employer].  DVT Prophylaxis - Lovenox and Foot Pumps Weight-Bearing as tolerated to left leg  J. Cameron Proud, PA-C [facility] Orthopaedic Surgery 09/28/2019, 7:38 AM

## 2019-09-28 NOTE — Progress Notes (Signed)
Physical Therapy Treatment Patient Details Name: Tina Hayes MRN: [id number] DOB: May 19, 1945 Today's Date: 09/28/2019    History of Present Illness Pt admitted for L TKR. 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    PT Comments    Pt is making limited progress towards goals with increased pain and nausea this date. Unable to tolerate OOB mobility at this time due to nausea. Good endurance with there-ex. Will attempt OOB in PM.   Follow Up Recommendations  Home health PT;Supervision/Assistance - 24 hour     Equipment Recommendations  Rolling walker with 5" wheels;3in1 (PT)    Recommendations for Other Services       Precautions / Restrictions Precautions Precautions: Knee;Fall Precaution Booklet Issued: Yes (comment) Restrictions Weight Bearing Restrictions: Yes LLE Weight Bearing: Weight bearing as tolerated    Mobility  Bed Mobility               General bed mobility comments: refused due to nausea  Transfers                    Ambulation/Gait                 Stairs             Wheelchair Mobility    Modified Rankin (Stroke Patients Only)       Balance Overall balance assessment: Needs assistance Sitting-balance support: Feet supported Sitting balance-Leahy Scale: Good     Standing balance support: Bilateral upper extremity supported Standing balance-Leahy Scale: Good                              Cognition Arousal/Alertness: Awake/alert Behavior During Therapy: WFL for tasks assessed/performed Overall Cognitive Status: Within Functional Limits for tasks assessed                                        Exercises Total Joint Exercises Goniometric ROM: L knee at 0 degrees extension. Will measure flexion in PM Other Exercises Other Exercises: supine ther-ex performed on L LE including AP, quad sets, SAQ, hip abd/add, and SLRs. ALl ther-ex performed x 12 reps with cga.      General Comments        Pertinent Vitals/Pain Pain Assessment: 0-10 Pain Score: 10-Worst pain ever Pain Location: L knee Pain Descriptors / Indicators: Operative site guarding Pain Intervention(s): Limited activity within patient's tolerance;Repositioned;Ice applied    Home Living                      Prior Function            PT Goals (current goals can now be found in the care plan section) Acute Rehab PT Goals Patient Stated Goal: to go home PT Goal Formulation: With patient Time For Goal Achievement: 10/11/19 Potential to Achieve Goals: Good Progress towards PT goals: Progressing toward goals    Frequency    BID      PT Plan Current plan remains appropriate    Co-evaluation              AM-PAC PT "6 Clicks" Mobility   Outcome Measure  Help needed turning from your back to your side while in a flat bed without using bedrails?: None Help needed moving from lying on your back to sitting on  the side of a flat bed without using bedrails?: None Help needed moving to and from a bed to a chair (including a wheelchair)?: A Little Help needed standing up from a chair using your arms (e.g., wheelchair or bedside chair)?: A Little Help needed to walk in hospital room?: A Little Help needed climbing 3-5 steps with a railing? : A Lot 6 Click Score: 19    End of Session Equipment Utilized During Treatment: Gait belt Activity Tolerance: Patient tolerated treatment well Patient left: in bed;with bed alarm set;with SCD's reapplied Nurse Communication: Mobility status PT Visit Diagnosis: Unsteadiness on feet (R26.81);Muscle weakness (generalized) (M62.81);Difficulty in walking, not elsewhere classified (R26.2);Pain Pain - Right/Left: Left Pain - part of body: Knee     Time: [id number] PT Time Calculation (min) (ACUTE ONLY): 24 min  Charges:  $Therapeutic Exercise: 23-37 mins                     Tina Hayes, PT,  DPT [phone]    Tina Hayes 09/28/2019, 1:48 PM

## 2019-09-28 NOTE — Progress Notes (Signed)
At 2148 patient blood glucose was 58, at 2217 rechecked blood glucose 41. IV Dextrose 25grams was given rechecked blood glucose 154. Did not give the patient the night dose of Lantus due to the hypoglycemia. At 2358 sent secure chat message to NP Sharion Settler to notify her of the matter, and to notify patient didn't eat today due to GERD (medications were ordered to treat that during the day).

## 2019-09-29 LAB — BASIC METABOLIC PANEL
Anion gap: 9 (ref 5–15)
BUN: 10 mg/dL (ref 8–23)
CO2: 25 mmol/L (ref 22–32)
Calcium: 8.2 mg/dL — ABNORMAL LOW (ref 8.9–10.3)
Chloride: 110 mmol/L (ref 98–111)
Creatinine, Ser: 0.85 mg/dL (ref 0.44–1.00)
GFR calc Af Amer: >60 mL/min (ref >60)
GFR calc non Af Amer: >60 mL/min (ref >60)
Glucose, Bld: 89 mg/dL (ref 70–99)
Potassium: 3.5 mmol/L (ref 3.5–5.1)
Sodium: 144 mmol/L (ref 135–145)

## 2019-09-29 LAB — GLUCOSE, CAPILLARY
Glucose-Capillary: 111 mg/dL — ABNORMAL HIGH (ref 70–99)
Glucose-Capillary: 83 mg/dL (ref 70–99)
Glucose-Capillary: 166 mg/dL — ABNORMAL HIGH (ref 70–99)
Glucose-Capillary: 148 mg/dL — ABNORMAL HIGH (ref 70–99)
Glucose-Capillary: 106 mg/dL — ABNORMAL HIGH (ref 70–99)

## 2019-09-29 LAB — CBC
HCT: 26.4 % — ABNORMAL LOW (ref 36.0–46.0)
Hemoglobin: 8.2 g/dL — ABNORMAL LOW (ref 12.0–15.0)
MCH: 26.5 pg (ref 26.0–34.0)
MCHC: 31.1 g/dL (ref 30.0–36.0)
MCV: 85.4 fL (ref 80.0–100.0)
Platelets: 192 10*3/uL (ref 150–400)
RBC: 3.09 MIL/uL — ABNORMAL LOW (ref 3.87–5.11)
RDW: 16 % — ABNORMAL HIGH (ref 11.5–15.5)
WBC: 6.6 10*3/uL (ref 4.0–10.5)
nRBC: 0 % (ref 0.0–0.2)

## 2019-09-29 MED ORDER — INSULIN GLARGINE 100 UNIT/ML ~~LOC~~ SOLN
15.0000 [IU] | Freq: Every morning | SUBCUTANEOUS | Status: DC
  Administered 2019-10-01 – 2019-10-04 (×4): 15 [IU] via SUBCUTANEOUS
  Filled 2019-09-29 (×5): qty 0.15

## 2019-09-29 MED ORDER — INSULIN GLARGINE 100 UNIT/ML ~~LOC~~ SOLN
20.0000 [IU] | Freq: Every day | SUBCUTANEOUS | Status: DC
  Administered 2019-09-29 – 2019-10-03 (×5): 20 [IU] via SUBCUTANEOUS
  Filled 2019-09-29 (×6): qty 0.2

## 2019-09-29 NOTE — Progress Notes (Addendum)
Physical Therapy Treatment Patient Details Name: Tina Hayes MRN: [id number] DOB: [birth year] Today's Date: 09/29/2019    History of Present Illness Pt admitted for L TKR. 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    PT Comments    Pt up in chair less than 2 hours asking to go back to bed.  Assisted to stand with min a x 1 and transfer to bed with RW and min a x 1. Refused further gait. Upon sitting on bed, KI removed.  She is encouraged to remain sitting EOB and "dangle" LE off EOB.  Pt c/o pain with activity and self initiates return to supine before further stretching and exercises can be completed.  She does need mod a x 1 to get LE up on bed.  Positioned to comfort.  Pt asking for LE's of bed to be flexed to relieve pressure on her back.  Education provided regarding positioning and precautions.    After session pt agrees that SNF is appropriate for discharge.  While she still wishes to return home she acknowledges that it will be a struggle for her.  Will update discharge recommendations.  Discussed with SWS and primary PT on site.   Follow Up Recommendations  SNF     Equipment Recommendations  Rolling walker with 5" wheels;3in1 (PT);Wheelchair (measurements PT)    Recommendations for Other Services       Precautions / Restrictions Precautions Precautions: Knee;Fall Precaution Booklet Issued: Yes (comment) Precaution Comments: KI on for mobility Required Braces or Orthoses: Knee Immobilizer - Left Knee Immobilizer - Left: On when out of bed or walking Restrictions Weight Bearing Restrictions: Yes LLE Weight Bearing: Weight bearing as tolerated    Mobility  Bed Mobility Overal bed mobility: Needs Assistance Bed Mobility: Sit to Supine     Supine to sit: Min assist Sit to supine: Mod assist   General bed mobility comments: Mod a to get LE back onto bed  Transfers Overall transfer level: Needs assistance Equipment used: Rolling walker (2  wheeled) Transfers: Sit to/from Stand Sit to Stand: Min assist         General transfer comment: Min A to come to stand from elevated bed. Close min guard & +2 for mgt of lines/leads.  Ambulation/Gait Ambulation/Gait assistance: Min assist Gait Distance (Feet): 3 Feet Assistive device: Rolling walker (2 wheeled) Gait Pattern/deviations: Step-to pattern;Trunk flexed Gait velocity: decreased   General Gait Details: able to transfer back to bed with +1 assist.  KI donned   Stairs             Wheelchair Mobility    Modified Rankin (Stroke Patients Only)       Balance Overall balance assessment: Needs assistance Sitting-balance support: Feet supported;Single extremity supported Sitting balance-Leahy Scale: Good Sitting balance - Comments: Steady static sitting, reaching within BOS.   Standing balance support: Bilateral upper extremity supported Standing balance-Leahy Scale: Fair Standing balance comment: Requires heavy BUE support on RW.                            Cognition Arousal/Alertness: Awake/alert Behavior During Therapy: WFL for tasks assessed/performed Overall Cognitive Status: Within Functional Limits for tasks assessed                                        Exercises Total Joint Exercises Goniometric  ROM: 0-65 - self limited by pain and self initaites return to supine before proper stretching in sitting is complete Other Exercises Other Exercises: supine ther-ex performed on L LE including AP, quad sets, SAQ, hip abd/add, and SLRs. ALl ther-ex performed x 12 reps with cga.  Other Exercises: Pt with poor recall of education provided by OT previous date. Additional education provided for AE for LB ADL, polar care mgt; pt verbalizes understanding, handout provided to support recall and carryover    General Comments        Pertinent Vitals/Pain Pain Assessment: Faces Pain Score: 9  Faces Pain Scale: Hurts whole lot Pain  Location: L knee - reports overall increased pain today Pain Descriptors / Indicators: Operative site guarding;Sore;Aching Pain Intervention(s): Limited activity within patient's tolerance;Monitored during session;Repositioned    Home Living                      Prior Function            PT Goals (current goals can now be found in the care plan section) Acute Rehab PT Goals Patient Stated Goal: to go home Progress towards PT goals: Not progressing toward goals - comment    Frequency    BID      PT Plan Other (comment);Discharge plan needs to be updated(Pt now in agreement that SNF is best for discharge plan)    Co-evaluation PT/OT/SLP Co-Evaluation/Treatment: Yes Reason for Co-Treatment: Complexity of the patient's impairments (multi-system involvement);For patient/therapist safety;To address functional/ADL transfers PT goals addressed during session: Mobility/safety with mobility;Strengthening/ROM;Proper use of DME OT goals addressed during session: ADL's and self-care;Proper use of Adaptive equipment and DME      AM-PAC PT "6 Clicks" Mobility   Outcome Measure  Help needed turning from your back to your side while in a flat bed without using bedrails?: A Little Help needed moving from lying on your back to sitting on the side of a flat bed without using bedrails?: A Little Help needed moving to and from a bed to a chair (including a wheelchair)?: A Little Help needed standing up from a chair using your arms (e.g., wheelchair or bedside chair)?: A Little Help needed to walk in hospital room?: A Little Help needed climbing 3-5 steps with a railing? : Total 6 Click Score: 16    End of Session Equipment Utilized During Treatment: Gait belt Activity Tolerance: Patient limited by pain;Patient limited by fatigue Patient left: in bed;with call bell/phone within reach;with bed alarm set;with SCD's reapplied Nurse Communication: Mobility status Pain - Right/Left:  Left Pain - part of body: Knee     Time: 1217-1229 PT Time Calculation (min) (ACUTE ONLY): 12 min  Charges:  $Gait Training: 8-22 mins $Therapeutic Activity: 8-22 mins                    Chesley Noon, PTA 09/29/19, 12:40 PM

## 2019-09-29 NOTE — Progress Notes (Signed)
Subjective: 2 Days Post-Op Procedure(s) (LRB): TOTAL KNEE ARTHROPLASTY (Left) Patient reports pain as moderate.   Patient is well but was experiencing reflux yesterday, improved with Pepcid. BP soft yesterday, most recent BP 137/69 PT and care management this AM.  Plan for possible d/c to SNF at this time. Negative for chest pain and shortness of breath Fever: no Gastrointestinal:Negative for nausea and vomiting this morning  Objective: Vital signs in last 24 hours: Temp:  [99.6 F (37.6 C)-101 F (38.3 C)] 99.6 F (37.6 C) (06/03 0750) Pulse Rate:  [64-73] 69 (06/03 0750) Resp:  [18-20] 18 (06/03 0750) BP: (86-157)/(29-69) 137/69 (06/03 0750) SpO2:  [95 %-100 %] 95 % (06/03 0900)  Intake/Output from previous day:  Intake/Output Summary (Last 24 hours) at 09/29/2019 1300 Last data filed at 09/29/2019 1009 Gross per 24 hour  Intake 2189.34 ml  Output 2 ml  Net 2187.34 ml    Intake/Output this shift: Total I/O In: 120 [P.O.:120] Out: 1 [Urine:1]  Labs: Recent Labs    09/28/19 0532 09/29/19 0636  HGB 8.7* 8.2*   Recent Labs    09/28/19 0532 09/29/19 0636  WBC 6.2 6.6  RBC 3.27* 3.09*  HCT 28.1* 26.4*  PLT 217 192   Recent Labs    09/28/19 0532 09/28/19 0532 09/28/19 1401 09/29/19 0636  NA 143  --   --  144  K 3.0*   < > 3.2* 3.5  CL 105  --   --  110  CO2 28  --   --  25  BUN 13  --   --  10  CREATININE 0.97  --   --  0.85  GLUCOSE 176*  --   --  89  CALCIUM 8.1*  --   --  8.2*   < > = values in this interval not displayed.   No results for input(s): LABPT, INR in the last 72 hours.   EXAM General - Patient is Alert, Appropriate and Oriented Extremity - ABD soft Sensation intact distally Intact pulses distally Dorsiflexion/Plantar flexion intact Incision: dressing C/D/I No cellulitis present Dressing/Incision - clean, dry, no drainage Motor Function - intact, moving foot and toes well on exam.   Past Medical History:  Diagnosis Date  .  (HFpEF) heart failure with preserved ejection fraction ([place])    a. 05/2016 Echo: EF 60-65%, mild to mod LVH, Gr1 DD, mild MR, mildly dil LA, mod TR, mildly to mod increased PASP.  [name] Acute diastolic heart failure ([employer]) 01/27/2017  . Anxiety   . Arthritis   . Chest pain 06/16/2016  . CHF (congestive heart failure) ([place])   . Chronic back pain   . Chronic diastolic congestive heart failure ([place]) 02/13/2017  . COPD (chronic obstructive pulmonary disease) ([place])   . Coronary artery disease    a. s/p remote PCI x 5;  b. 2006 s/p CABG x 3 ([place], [place]); b. 05/2016 MV: attenuation corrected images w/o ischemia or wma-->Med rx.  . Coronary artery disease of native artery of native heart with stable angina pectoris ([place]) 06/17/2016  . Depression   . Diabetes mellitus without complication ([place])   . Essential hypertension 06/30/2016  . GERD (gastroesophageal reflux disease)   . Heart attack ([place])    Total of 3 per pt.  . Hypertension   . Hypertensive urgency 06/03/2015  . Seizure ([place])   . Seizures (H[facility])     Assessment/Plan: 2 Days Post-Op Procedure(s) (LRB): TOTAL KNEE ARTHROPLASTY (Left) Active Problems:  Status post total knee replacement using cement, left  Estimated body mass index is 40.73 kg/m as calculated from the following:   Height as of 08/22/19: 5\' 11"  (1.803 m).   Weight as of this encounter: 132.5 kg. Up with therapy   Labs reviewed.   K+ 3.5 this AM. Continue with therapy.  Plan is now for possible d/c to SNF. Temp 101 last night, encouraged incentive spirometer.  If continued fevers, will obtain UA and CXR. Patient is passing gas, work on BM at this time.  DVT Prophylaxis - Lovenox and Foot Pumps Weight-Bearing as tolerated to left leg  J. Cameron Proud, PA-C [facility] 09/29/2019, 1:00 PM

## 2019-09-29 NOTE — NC FL2 (Signed)
[place] LEVEL OF CARE SCREENING TOOL     IDENTIFICATION  Patient Name: Tina Hayes Birthdate: March 31, 1946 Sex: female Admission Date (Current Location): 09/27/2019  [place] and [place] Number:  Engineering geologist and Address:  [facility], [address], [place], [place] [address]      Provider Number: [id number]  Attending Physician Name and Address:  [name], MD  Relative Name and Phone Number:  Harriet Butte [phone]    Current Level of Care: Hospital Recommended Level of Care: [place] Prior Approval Number:    Date Approved/Denied:   PASRR Number: [id number]  Discharge Plan: SNF    Current Diagnoses: Patient Active Problem List   Diagnosis Date Noted  . Status post total knee replacement using cement, 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  . Atherosclerosis of native arteries of the extremities with ulceration ([place]) 07/05/2019  . Onychomycosis 05/30/2019  . Occlusion and stenosis of vertebral artery 08/31/2018  . AKI (acute kidney injury) ([place]) 05/25/2018  . BMI 50.0-59.9, adult ([place]) 04/16/2018  . Seizures ([place]) 11/10/2017  . Mass of right lower leg 09/11/2017  . Dysphagia 04/23/2017  . Chronic diastolic congestive heart failure ([place]) 02/13/2017  . Acute diastolic heart failure ([place]) 01/27/2017  . Depression 12/11/2016  . Essential hypertension 06/30/2016  . Snoring 06/30/2016  . Diabetes mellitus ([place]) 06/30/2016  . Coronary artery disease of native artery of native heart with stable angina pectoris ([place]) 06/17/2016  . Morbid obesity ([place]) 06/17/2016  . COPD (chronic obstructive pulmonary disease) ([place]) 08/28/2015  . COPD with acute exacerbation ([place]) 04/14/2015    Orientation RESPIRATION BLADDER Height & Weight     Self, Time, Situation, Place  Normal External catheter Weight: 132.5 kg Height:     BEHAVIORAL SYMPTOMS/MOOD NEUROLOGICAL BOWEL NUTRITION STATUS      Incontinent Diet(Heart Healthy, Carb  Modified)  AMBULATORY STATUS COMMUNICATION OF NEEDS Skin   Extensive Assist Verbally Surgical wounds                       Personal Care Assistance Level of Assistance  Bathing, Feeding, Dressing Bathing Assistance: Maximum assistance Feeding assistance: Limited assistance Dressing Assistance: Maximum assistance     Functional Limitations Info  Sight, Speech, Hearing Sight Info: Adequate Hearing Info: Adequate Speech Info: Adequate    SPECIAL CARE FACTORS FREQUENCY  PT (By licensed PT), OT (By licensed OT)                    Contractures Contractures Info: Not present    Additional Factors Info  Code Status, Allergies Code Status Info: Full Allergies Info: Aspirin           Current Medications (09/29/2019):  This is the current hospital active medication list Current Facility-Administered Medications  Medication Dose Route Frequency Provider Last Rate Last Admin  . 0.9 %  sodium chloride infusion   Intravenous Continuous Poggi, Marshall Cork, MD 20 mL/hr at 09/28/19 2000 Rate Verify at 09/28/19 2000  . acetaminophen (TYLENOL) tablet 325-650 mg  325-650 mg Oral Q6H PRN Poggi, Marshall Cork, MD      . albuterol (PROVENTIL) (2.5 MG/3ML) 0.083% nebulizer solution 2.5 mg  2.5 mg Nebulization Q4H PRN Poggi, Marshall Cork, MD   2.5 mg at 09/29/19 0934  . amLODipine (NORVASC) tablet 10 mg  10 mg Oral Daily Poggi, Marshall Cork, MD   10 mg at 09/29/19 0859  . atorvastatin (LIPITOR) tablet 40 mg  40 mg Oral QHS Poggi,  Marshall Cork, MD   40 mg at 09/28/19 2210  . bisacodyl (DULCOLAX) suppository 10 mg  10 mg Rectal Daily PRN Poggi, Marshall Cork, MD      . budesonide (PULMICORT) nebulizer solution 0.5 mg  0.5 mg Nebulization Q1200 Poggi, Marshall Cork, MD   0.5 mg at 09/29/19 0935  . carvedilol (COREG) tablet 3.125 mg  3.125 mg Oral BID WC Poggi, Marshall Cork, MD   3.125 mg at 09/29/19 0859  . dextrose 50 % solution 25 mL  0.5 ampule Intravenous Once Arita Miss, MD      . dextrose 50 % solution 25 mL  0.5 ampule Intravenous  Once Arita Miss, MD      . diphenhydrAMINE (BENADRYL) 12.5 MG/5ML elixir 12.5-25 mg  12.5-25 mg Oral Q4H PRN Poggi, Marshall Cork, MD      . docusate sodium (COLACE) capsule 100 mg  100 mg Oral BID Poggi, Marshall Cork, MD   100 mg at 09/29/19 0900  . enoxaparin (LOVENOX) injection 40 mg  40 mg Subcutaneous Q12H Lu Duffel, RPH   40 mg at 09/29/19 [id number]  . famotidine (PEPCID) tablet 20-40 mg  20-40 mg Oral BID Lattie Corns, PA-C   40 mg at 09/29/19 [id number]  . ferrous sulfate tablet 325 mg  325 mg Oral Q breakfast Poggi, Marshall Cork, MD   325 mg at 09/29/19 0858  . HYDROmorphone (DILAUDID) injection 0.25-0.5 mg  0.25-0.5 mg Intravenous Q2H PRN Poggi, Marshall Cork, MD      . insulin aspart (novoLOG) injection 0-15 Units  0-15 Units Subcutaneous TID WC Poggi, Marshall Cork, MD   3 Units at 09/29/19 1143  . [START ON 09/30/2019] insulin glargine (LANTUS) injection 15 Units  15 Units Subcutaneous q morning - 10a Poggi, Marshall Cork, MD      . insulin glargine (LANTUS) injection 20 Units  20 Units Subcutaneous QHS Poggi, Marshall Cork, MD      . isosorbide mononitrate (IMDUR) 24 hr tablet 30 mg  30 mg Oral BID Poggi, Marshall Cork, MD   30 mg at 09/29/19 0859  . levETIRAcetam (KEPPRA) tablet 750 mg  750 mg Oral BID [name], MD   750 mg at 09/29/19 0905  . loratadine (CLARITIN) tablet 10 mg  10 mg Oral Daily Poggi, Marshall Cork, MD   10 mg at 09/29/19 0859  . losartan (COZAAR) tablet 100 mg  100 mg Oral Daily Poggi, Marshall Cork, MD   100 mg at 09/29/19 0859  . magnesium hydroxide (MILK OF MAGNESIA) suspension 30 mL  30 mL Oral Daily PRN Poggi, Marshall Cork, MD      . metoCLOPramide (REGLAN) tablet 5-10 mg  5-10 mg Oral Q8H PRN Poggi, Marshall Cork, MD       Or  . metoCLOPramide (REGLAN) injection 5-10 mg  5-10 mg Intravenous Q8H PRN Poggi, Marshall Cork, MD      . mometasone-formoterol (DULERA) 200-5 MCG/ACT inhaler 2 puff  2 puff Inhalation BID Poggi, Marshall Cork, MD   2 puff at 09/29/19 0905  . montelukast (SINGULAIR) tablet 10 mg  10 mg Oral QHS Poggi, Marshall Cork, MD   10  mg at 09/28/19 2210  . nitroGLYCERIN (NITROSTAT) SL tablet 0.4 mg  0.4 mg Sublingual Q5 min PRN Poggi, Marshall Cork, MD      . ondansetron [facility]) tablet 4 mg  4 mg Oral Q6H PRN Poggi, Marshall Cork, MD       Or  . ondansetron [facility]) injection 4 mg  4  mg Intravenous Q6H PRN Poggi, Marshall Cork, MD   4 mg at 09/28/19 0913  . oxyCODONE (Oxy IR/ROXICODONE) immediate release tablet 5-10 mg  5-10 mg Oral Q4H PRN Poggi, Marshall Cork, MD   10 mg at 09/29/19 0847  . pantoprazole (PROTONIX) EC tablet 40 mg  40 mg Oral Daily Poggi, Marshall Cork, MD   40 mg at 09/29/19 0859  . potassium chloride SA (KLOR-CON) CR tablet 40 mEq  40 mEq Oral BID [name], MD   40 mEq at 09/29/19 0859  . rOPINIRole (REQUIP) tablet 1 mg  1 mg Oral QHS Poggi, Marshall Cork, MD   1 mg at 09/28/19 2210  . sertraline (ZOLOFT) tablet 100 mg  100 mg Oral QHS Poggi, Marshall Cork, MD   100 mg at 09/28/19 2210  . sodium phosphate (FLEET) 7-19 GM/118ML enema 1 enema  1 enema Rectal Once PRN Poggi, Marshall Cork, MD      . tiotropium [facility]) inhalation capsule ([facility] use ONLY) 18 mcg  18 mcg Inhalation Daily Poggi, Marshall Cork, MD   18 mcg at 09/29/19 0906  . torsemide (DEMADEX) tablet 60 mg  60 mg Oral BID [name], MD   60 mg at 09/29/19 0859  . traMADol (ULTRAM) tablet 50 mg  50 mg Oral Q6H Poggi, Marshall Cork, MD   50 mg at 09/29/19 1143  . traZODone (DESYREL) tablet 50 mg  50 mg Oral QHS Poggi, Marshall Cork, MD   50 mg at 09/28/19 2210     Discharge Medications: Please see discharge summary for a list of discharge medications.  Relevant Imaging Results:  Relevant Lab Results:   Additional Information SS# [id number]  Shelbie Ammons, RN

## 2019-09-29 NOTE — Plan of Care (Signed)

## 2019-09-29 NOTE — Progress Notes (Addendum)
Physical Therapy Treatment Patient Details Name: Tina Hayes MRN: [id number] DOB: 1945-05-12 Today's Date: 09/29/2019    History of Present Illness Pt admitted for L TKR. 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    PT Comments    Co-tx with OT  10:17-10:48 PT  10:25-11:00 OT.  One unit billed PT, 2 OT per protocols.  Pt c/o increased pain today.  Received pain medication prior but she reports overall decreased tolerance with activity and OOB today but agrees to try.  Participated in exercises as described below.  KI donned prior to sitting.  Min a x 1 with heavy use of rails to get to EOB.  Steady once sitting.  She is able to stand with min a x 1 to RW and turn to recliner.  While turning she is inc large amount of urine on floor.  Sat in recliner to obtain commode where she is able to transfer and continue voiding.  Stands but requires assist of OT for self care.  She is able to complete transfer to recliner but declines further gait.  Remained with OT after discussion regarding discharge planning.  Will address Stretching/ROM L knee in PM session as pain limited this am.  Pt remains firm in her decision to return home with assist of her son.  Discussed at length regarding risks/benefits of home vs SNF.  Given limited gait, increased pain, poor safety and overall progress SNF is highly recommended.  She is unable to walk household distances at this time.  She does not have stairs into her home and is able to pull right up to door but concern remains at this time over her ability to access her home given max 10' gait yesterday and only 4' today.  A wheelchair would be recommended if she remains firm.  Overall transfers with +1 assist and stated her son is able to be home with her 24 hrs a day to assist.  Education at length and pt encouraged to reconsider SNF.  Will continue discussion this pm.   Follow Up Recommendations  Home health PT;Supervision/Assistance - 24 hour      Equipment Recommendations  Rolling walker with 5" wheels;3in1 (PT) , wheelchair   Recommendations for Other Services       Precautions / Restrictions Precautions Precautions: Knee;Fall Precaution Booklet Issued: Yes (comment) Precaution Comments: KI on for mobility Required Braces or Orthoses: Knee Immobilizer - Left Knee Immobilizer - Left: On when out of bed or walking Restrictions Weight Bearing Restrictions: Yes LLE Weight Bearing: Weight bearing as tolerated    Mobility  Bed Mobility Overal bed mobility: Needs Assistance Bed Mobility: Supine to Sit     Supine to sit: Min assist     General bed mobility comments: Min A to bring LLE over EOB.  Transfers Overall transfer level: Needs assistance Equipment used: Rolling walker (2 wheeled) Transfers: Sit to/from Stand Sit to Stand: Min assist;Min guard            Ambulation/Gait Ambulation/Gait assistance: Min guard;Min assist;+2 safety/equipment Gait Distance (Feet): 4 Feet Assistive device: Rolling walker (2 wheeled) Gait Pattern/deviations: Step-to pattern Gait velocity: decreased   General Gait Details: ambulated in room using KI. CGA with close 2nd person for safety. Verbal cues and education throughout afor safety and hand placements   Stairs             Wheelchair Mobility    Modified Rankin (Stroke Patients Only)       Balance  Overall balance assessment: Needs assistance Sitting-balance support: Feet supported;No upper extremity supported Sitting balance-Leahy Scale: Good     Standing balance support: Bilateral upper extremity supported Standing balance-Leahy Scale: Fair Standing balance comment: Requires heavy BUE support on RW.                            Cognition Arousal/Alertness: Awake/alert Behavior During Therapy: WFL for tasks assessed/performed Overall Cognitive Status: Within Functional Limits for tasks assessed                                         Exercises Other Exercises Other Exercises: supine ther-ex performed on L LE including AP, quad sets, SAQ, hip abd/add, and SLRs. ALl ther-ex performed x 12 reps with cga.     General Comments        Pertinent Vitals/Pain Pain Assessment: 0-10 Pain Score: 9  Pain Location: L knee - reports overall increased pain today Pain Descriptors / Indicators: Operative site guarding;Sore;Aching Pain Intervention(s): Limited activity within patient's tolerance;Monitored during session;Premedicated before session;Repositioned;Ice applied    Home Living                      Prior Function            PT Goals (current goals can now be found in the care plan section) Progress towards PT goals: Progressing toward goals    Frequency    BID      PT Plan Current plan remains appropriate    Co-evaluation PT/OT/SLP Co-Evaluation/Treatment: Yes Reason for Co-Treatment: Complexity of the patient's impairments (multi-system involvement);For patient/therapist safety;To address functional/ADL transfers PT goals addressed during session: Mobility/safety with mobility;Proper use of DME;Strengthening/ROM OT goals addressed during session: ADL's and self-care;Proper use of Adaptive equipment and DME      AM-PAC PT "6 Clicks" Mobility   Outcome Measure  Help needed turning from your back to your side while in a flat bed without using bedrails?: A Little Help needed moving from lying on your back to sitting on the side of a flat bed without using bedrails?: A Little Help needed moving to and from a bed to a chair (including a wheelchair)?: A Little Help needed standing up from a chair using your arms (e.g., wheelchair or bedside chair)?: A Little Help needed to walk in hospital room?: A Little Help needed climbing 3-5 steps with a railing? : Total 6 Click Score: 16    End of Session Equipment Utilized During Treatment: Gait belt Activity Tolerance: Patient limited by  pain;Patient limited by fatigue Patient left: in chair;with chair alarm set Nurse Communication: Mobility status Pain - Right/Left: Left Pain - part of body: Knee     Time: [id number] PT Time Calculation (min) (ACUTE ONLY): 31 min  Charges:  $Gait Training: 8-22 mins                    .Chesley Noon, PTA 09/29/19, 11:20 AM

## 2019-09-29 NOTE — Progress Notes (Signed)
Occupational Therapy Treatment Patient Details Name: Tina Hayes MRN: [id number] DOB: 1945-11-15 Today's Date: 09/29/2019    History of present illness Pt admitted for L TKR. 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   OT comments  Pt seen for OT tx this date with co-tx portion with PT. Pt insistent upon returning home with son to assist as needed. Motivated to "do whatever I got to do" to meet her goal. Pt reporting significant pain. Min A with PT for transfers with loss of bladder control requiring prompting quick sit in recliner. Max A for posterior pericare while pt able to perform toileting hygiene anteriorly with set up of wet wipe. Once pt in recliner, pt reporting mils improvement in L  Knee pain. Pt educated in polar care (did not recall previous education given the date before). Handout provided to support recall and carryover. Pt continues to be eager to return home directly from the hospital. Pt states she knows she can manage with her son's assist. Pt educated in assist needed and risks with OT and PTA present. Pt verbalizes understanding and remains determined to return home. Pt continues to benefit from skilled OT services. Will keep [place] rec at this time pending additional progress next session.    Follow Up Recommendations  Home health OT;Supervision - Intermittent    Equipment Recommendations  3 in 1 bedside commode    Recommendations for Other Services      Precautions / Restrictions Precautions Precautions: Knee;Fall Precaution Booklet Issued: Yes (comment) Precaution Comments: KI on for mobility Required Braces or Orthoses: Knee Immobilizer - Left Knee Immobilizer - Left: On when out of bed or walking Restrictions Weight Bearing Restrictions: Yes LLE Weight Bearing: Weight bearing as tolerated       Mobility Bed Mobility Overal bed mobility: Needs Assistance Bed Mobility: Supine to Sit     Supine to sit: Min assist     General bed  mobility comments: Min A to bring LLE over EOB.- PTA  Transfers Overall transfer level: Needs assistance Equipment used: Rolling walker (2 wheeled) Transfers: Sit to/from Stand Sit to Stand: Min assist;Min guard         General transfer comment: Min A to come to stand from elevated bed. Close min guard & +2 for mgt of lines/leads.    Balance Overall balance assessment: Needs assistance Sitting-balance support: Feet supported;No upper extremity supported Sitting balance-Leahy Scale: Good Sitting balance - Comments: Steady static sitting, reaching within BOS.   Standing balance support: Bilateral upper extremity supported Standing balance-Leahy Scale: Fair Standing balance comment: Requires heavy BUE support on RW.                           ADL either performed or assessed with clinical judgement   ADL Overall ADL's : Needs assistance/impaired                         Toilet Transfer: RW;Requires wide/bariatric;Ambulation;BSC;Minimal assistance   Toileting- Clothing Manipulation and Hygiene: Maximal assistance;Sit to/from stand Toileting - Clothing Manipulation Details (indicate cue type and reason): With CGA from PTA during standing with heavy BUE on RW, pt required Max A for pericare (set up for hygiene anteriorly)     Functional mobility during ADLs: Minimal assistance;Rolling walker;Cueing for safety;+2 for safety/equipment       Vision Baseline Vision/History: Wears glasses Wears Glasses: At all times Patient Visual Report: No change from  baseline     Perception     Praxis      Cognition Arousal/Alertness: Awake/alert Behavior During Therapy: WFL for tasks assessed/performed Overall Cognitive Status: Within Functional Limits for tasks assessed                                          Exercises Other Exercises Other Exercises: Pt with poor recall of education provided by OT previous date. Additional education provided for  AE for LB ADL, polar care mgt; pt verbalizes understanding, handout provided to support recall and carryover   Shoulder Instructions       General Comments      Pertinent Vitals/ Pain       Pain Assessment: 0-10 Pain Score: 9  Pain Location: L knee - reports overall increased pain today Pain Descriptors / Indicators: Operative site guarding;Sore;Aching Pain Intervention(s): Limited activity within patient's tolerance;Monitored during session;Premedicated before session;Repositioned;Ice applied  Home Living                                          Prior Functioning/Environment              Frequency  Min 1X/week        Progress Toward Goals  OT Goals(current goals can now be found in the care plan section)  Progress towards OT goals: OT to reassess next treatment  Acute Rehab OT Goals Patient Stated Goal: to go home OT Goal Formulation: With patient Time For Goal Achievement: 10/12/19 Potential to Achieve Goals: Good  Plan Discharge plan remains appropriate;Frequency remains appropriate    Co-evaluation    PT/OT/SLP Co-Evaluation/Treatment: Yes Reason for Co-Treatment: Complexity of the patient's impairments (multi-system involvement);For patient/therapist safety;To address functional/ADL transfers PT goals addressed during session: Mobility/safety with mobility;Strengthening/ROM;Proper use of DME OT goals addressed during session: ADL's and self-care;Proper use of Adaptive equipment and DME      AM-PAC OT "6 Clicks" Daily Activity     Outcome Measure   Help from another person eating meals?: None Help from another person taking care of personal grooming?: A Little Help from another person toileting, which includes using toliet, bedpan, or urinal?: A Little Help from another person bathing (including washing, rinsing, drying)?: A Lot Help from another person to put on and taking off regular upper body clothing?: A Little Help from another  person to put on and taking off regular lower body clothing?: A Lot 6 Click Score: 17    End of Session Equipment Utilized During Treatment: Gait belt;Rolling walker  OT Visit Diagnosis: Other abnormalities of gait and mobility (R26.89);Pain Pain - Right/Left: Left Pain - part of body: Knee   Activity Tolerance Patient tolerated treatment well   Patient Left in chair;with call bell/phone within reach;with chair alarm set;with SCD's reapplied;Other (comment)(polar care in place)   Nurse Communication          Time: 1025-1100 OT Time Calculation (min): 35 min  Charges: OT General Charges $OT Visit: 1 Visit OT Treatments $Self Care/Home Management : 23-37 mins  Jeni Salles, MPH, MS, OTR/L ascom [phone] 09/29/19, 12:37 PM

## 2019-09-29 NOTE — TOC Progression Note (Signed)
Transition of Care [facility]) - Progression Note    Patient Details  Name: Tina Hayes MRN: [id number] Date of Birth: 04-Nov-1945  Transition of Care [facility]) CM/SW Contact  Shelbie Ammons, RN Phone Number: 09/29/2019, 2:30 PM  Clinical Narrative:   RNCM met with patient at bedside, patient now has recommendations for SNF. Patient reports that she is agreeable if they think that is what she needs. She requests that this CM contact her son to discuss but is agreeable to a bed search being initiated. RNCM placed call to patient's son and left VM. RNCM verified PASSR, completed FL-2 and started bed search.     Expected Discharge Plan: [place] Barriers to Discharge: Continued Medical Work up  Expected Discharge Plan and Services Expected Discharge Plan: [place]   Discharge Planning Services: CM Consult   Living arrangements for the past 2 months: Single Family Home                 DME Arranged: 3-N-1, Walker rolling DME Agency: [employer] Date DME Agency Contacted: 09/28/19 Time DME Agency Contacted: [phone] Representative spoke with at DME Agency: [place]: PT [place]: [facility] [employer] Date [place]: 09/28/19 Time [place]: 20 Representative spoke with at [place]: [place] ([place]) Interventions    Readmission Risk Interventions No flowsheet data found.

## 2019-09-29 NOTE — Progress Notes (Signed)
°   09/29/19 1315  Clinical Encounter Type  Visited With Patient  Visit Type Follow-up  Referral From Chaplain  Consult/Referral To Chaplain  Chaplain checked in on patient. Chaplain and physical therapist entered the room together. Chaplain spoke to patient asking how she was feeling. She said staff came her something for pain. Since physical therapist was there to work with patient, chaplain told her that she will check on her tomorrow.

## 2019-09-30 ENCOUNTER — Inpatient Hospital Stay: Payer: Medicare HMO

## 2019-09-30 LAB — BASIC METABOLIC PANEL
Anion gap: 8 (ref 5–15)
BUN: 14 mg/dL (ref 8–23)
CO2: 28 mmol/L (ref 22–32)
Calcium: 8.2 mg/dL — ABNORMAL LOW (ref 8.9–10.3)
Chloride: 107 mmol/L (ref 98–111)
Creatinine, Ser: 1.05 mg/dL — ABNORMAL HIGH (ref 0.44–1.00)
GFR calc Af Amer: >60 mL/min (ref >60)
GFR calc non Af Amer: 53 mL/min — ABNORMAL LOW (ref >60)
Glucose, Bld: 144 mg/dL — ABNORMAL HIGH (ref 70–99)
Potassium: 3.8 mmol/L (ref 3.5–5.1)
Sodium: 143 mmol/L (ref 135–145)

## 2019-09-30 LAB — CBC
HCT: 28.3 % — ABNORMAL LOW (ref 36.0–46.0)
Hemoglobin: 8.9 g/dL — ABNORMAL LOW (ref 12.0–15.0)
MCH: 26.8 pg (ref 26.0–34.0)
MCHC: 31.4 g/dL (ref 30.0–36.0)
MCV: 85.2 fL (ref 80.0–100.0)
Platelets: 238 10*3/uL (ref 150–400)
RBC: 3.32 MIL/uL — ABNORMAL LOW (ref 3.87–5.11)
RDW: 16 % — ABNORMAL HIGH (ref 11.5–15.5)
WBC: 7.9 10*3/uL (ref 4.0–10.5)
nRBC: 0 % (ref 0.0–0.2)

## 2019-09-30 LAB — URINALYSIS, COMPLETE (UACMP) WITH MICROSCOPIC
Bilirubin Urine: NEGATIVE
Glucose, UA: >=500 mg/dL — AB
Hgb urine dipstick: NEGATIVE
Ketones, ur: NEGATIVE mg/dL
Nitrite: NEGATIVE
Protein, ur: NEGATIVE mg/dL
Specific Gravity, Urine: 1.009 (ref 1.005–1.030)
pH: 6 (ref 5.0–8.0)

## 2019-09-30 LAB — GLUCOSE, CAPILLARY
Glucose-Capillary: 123 mg/dL — ABNORMAL HIGH (ref 70–99)
Glucose-Capillary: 245 mg/dL — ABNORMAL HIGH (ref 70–99)
Glucose-Capillary: 194 mg/dL — ABNORMAL HIGH (ref 70–99)
Glucose-Capillary: 254 mg/dL — ABNORMAL HIGH (ref 70–99)

## 2019-09-30 LAB — SARS CORONAVIRUS 2 (TAT 6-24 HRS): SARS Coronavirus 2: NEGATIVE

## 2019-09-30 MED ORDER — OXYCODONE HCL 5 MG PO TABS: 5.0000 mg | ORAL_TABLET | ORAL | 0 refills | Status: DC | PRN

## 2019-09-30 MED ORDER — SODIUM CHLORIDE 0.9 % IV BOLUS
500.0000 mL | Freq: Once | INTRAVENOUS | Status: AC
  Administered 2019-09-30: 500 mL via INTRAVENOUS

## 2019-09-30 MED ORDER — TRAMADOL HCL 50 MG PO TABS: 50.0000 mg | ORAL_TABLET | Freq: Four times a day (QID) | ORAL | 0 refills | Status: DC

## 2019-09-30 MED ORDER — ENOXAPARIN SODIUM 40 MG/0.4ML ~~LOC~~ SOLN: 40.0000 mg | Freq: Two times a day (BID) | SUBCUTANEOUS | 0 refills | Status: DC

## 2019-09-30 NOTE — Progress Notes (Signed)
Subjective: 3 Days Post-Op Procedure(s) (LRB): TOTAL KNEE ARTHROPLASTY (Left) Patient reports pain as moderate.   Patient is well but did have fevers again last night. Plan for d/c to SNF. Negative for chest pain and shortness of breath Fever: no Gastrointestinal:Negative for nausea and vomiting this morning  Objective: Vital signs in last 24 hours: Temp:  [99 F (37.2 C)-100.1 F (37.8 C)] 99 F (37.2 C) (06/04 0740) Pulse Rate:  [60-69] 60 (06/04 0740) Resp:  [16-18] 16 (06/04 0740) BP: (90-137)/(47-98) 90/47 (06/04 0740) SpO2:  [94 %-100 %] 99 % (06/04 0740)  Intake/Output from previous day:  Intake/Output Summary (Last 24 hours) at 09/30/2019 0749 Last data filed at 09/30/2019 0430 Gross per 24 hour  Intake 360 ml  Output 4 ml  Net 356 ml    Intake/Output this shift: No intake/output data recorded.  Labs: Recent Labs    09/28/19 0532 09/29/19 0636  HGB 8.7* 8.2*   Recent Labs    09/28/19 0532 09/29/19 0636  WBC 6.2 6.6  RBC 3.27* 3.09*  HCT 28.1* 26.4*  PLT 217 192   Recent Labs    09/29/19 0636 09/30/19 0622  NA 144 143  K 3.5 3.8  CL 110 107  CO2 25 28  BUN 10 14  CREATININE 0.85 1.05*  GLUCOSE 89 144*  CALCIUM 8.2* 8.2*   No results for input(s): LABPT, INR in the last 72 hours.   EXAM General - Patient is Alert, Appropriate and Oriented Extremity - ABD soft Sensation intact distally Intact pulses distally Dorsiflexion/Plantar flexion intact Incision: dressing C/D/I No cellulitis present Dressing/Incision - clean, dry, no drainage Motor Function - intact, moving foot and toes well on exam.   Past Medical History:  Diagnosis Date  . (HFpEF) heart failure with preserved ejection fraction ([place])    a. 05/2016 Echo: EF 60-65%, mild to mod LVH, Gr1 DD, mild MR, mildly dil LA, mod TR, mildly to mod increased PASP.  [name] Acute diastolic heart failure ([place]) 01/27/2017  . Anxiety   . Arthritis   . Chest pain 06/16/2016  . CHF (congestive heart  failure) ([place])   . Chronic back pain   . Chronic diastolic congestive heart failure ([place]) 02/13/2017  . COPD (chronic obstructive pulmonary disease) ([place])   . Coronary artery disease    a. s/p remote PCI x 5;  b. 2006 s/p CABG x 3 ([place], [place]); b. 05/2016 MV: attenuation corrected images w/o ischemia or wma-->Med rx.  . Coronary artery disease of native artery of native heart with stable angina pectoris ([place]) 06/17/2016  . Depression   . Diabetes mellitus without complication ([place])   . Essential hypertension 06/30/2016  . GERD (gastroesophageal reflux disease)   . Heart attack ([place])    Total of 3 per pt.  . Hypertension   . Hypertensive urgency 06/03/2015  . Seizure ([place])   . Seizures (HCC)     Assessment/Plan: 3 Days Post-Op Procedure(s) (LRB): TOTAL KNEE ARTHROPLASTY (Left) Active Problems:   Status post total knee replacement using cement, left  Estimated body mass index is 40.73 kg/m as calculated from the following:   Height as of 08/22/19: 5\' 11"  (1.803 m).   Weight as of this encounter: 132.5 kg. Up with therapy   Labs reviewed, CBC ordered for this AM. K+ 3.8 this AM. BP low, asymptomatic.  Hold BP meds.  Not sure if cuff is getting accurate measurements on the patient. Continue with therapy. Plan for discharge to SNF. Temp  101 last night, UA and CXR ordered.  Encouraged incentive spirometer. Patient reports that she has had a BM.  DVT Prophylaxis - Lovenox and Foot Pumps Weight-Bearing as tolerated to left leg  J. Cameron Proud, PA-C [facility] Orthopaedic Surgery 09/30/2019, 7:49 AM

## 2019-09-30 NOTE — Care Management Important Message (Signed)
Important Message  Patient Details  Name: Tina Hayes MRN: [id number] Date of Birth: [birth year]   Medicare Important Message Given:  Yes     [name] 09/30/2019, 10:52 AM

## 2019-09-30 NOTE — Discharge Instructions (Signed)

## 2019-09-30 NOTE — Progress Notes (Signed)
Physical Therapy Treatment Patient Details Name: Tina Hayes MRN: [id number] DOB: 1945-07-30 Today's Date: 09/30/2019    History of Present Illness Pt admitted for L TKR. 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    PT Comments    BP noted to be soft this am.  Checked prior to session - large size cuff.  79/49, repeated 88/49.  Tech in and suggested checking with normal cuff.  78/46 P 56.  Called primary nurse to make her aware and also of her request for heart burn medication.  OOB deferred due to BP.  Offered supine ex but she declined at this time asking to come back this afternoon.   Follow Up Recommendations  SNF     Equipment Recommendations  Rolling walker with 5" wheels;3in1 (PT);Wheelchair (measurements PT)    Recommendations for Other Services       Precautions / Restrictions Precautions Precautions: Knee;Fall Precaution Booklet Issued: Yes (comment) Precaution Comments: KI on for mobility Required Braces or Orthoses: Knee Immobilizer - Left Knee Immobilizer - Left: On when out of bed or walking Restrictions Weight Bearing Restrictions: Yes LLE Weight Bearing: Weight bearing as tolerated    Mobility  Bed Mobility               General bed mobility comments: deferred due to BP  Transfers                    Ambulation/Gait                 Stairs             Wheelchair Mobility    Modified Rankin (Stroke Patients Only)       Balance                                            Cognition Arousal/Alertness: Awake/alert Behavior During Therapy: WFL for tasks assessed/performed Overall Cognitive Status: Within Functional Limits for tasks assessed                                        Exercises      General Comments        Pertinent Vitals/Pain Pain Assessment: Faces Faces Pain Scale: Hurts little more Pain Location: L knee Pain Descriptors / Indicators:  Operative site guarding;Sore;Aching Pain Intervention(s): Limited activity within patient's tolerance;Monitored during session    Home Living                      Prior Function            PT Goals (current goals can now be found in the care plan section) Progress towards PT goals: Not progressing toward goals - comment    Frequency    BID      PT Plan Current plan remains appropriate    Co-evaluation              AM-PAC PT "6 Clicks" Mobility   Outcome Measure  Help needed turning from your back to your side while in a flat bed without using bedrails?: A Little Help needed moving from lying on your back to sitting on the side of a flat bed without using bedrails?: A Little Help needed moving to  and from a bed to a chair (including a wheelchair)?: A Little Help needed standing up from a chair using your arms (e.g., wheelchair or bedside chair)?: A Little Help needed to walk in hospital room?: A Little Help needed climbing 3-5 steps with a railing? : Total 6 Click Score: 16    End of Session Equipment Utilized During Treatment: Gait belt Activity Tolerance: Treatment limited secondary to medical complications (Comment) Patient left: in bed;with call bell/phone within reach;with bed alarm set;with SCD's reapplied Nurse Communication: Other (comment)(BP and request for heart burn meds) Pain - Right/Left: Left Pain - part of body: Knee     Time: 1139-1150 PT Time Calculation (min) (ACUTE ONLY): 11 min  Charges:  $Therapeutic Activity: 8-22 mins                    Chesley Noon, PTA 09/30/19, 12:25 PM

## 2019-09-30 NOTE — Progress Notes (Signed)
   09/30/19 1200  Clinical Encounter Type  Visited With Patient  Visit Type Follow-up  Referral From Nurse  Consult/Referral To Chaplain  Chaplain visited with patient earlier this morning and talked with patient about being depressed. Chaplain talked with patient for a long time. Patient appeared to be down because she is not going directly home and her grandchildren are sad and crying. Chaplain explained that by going to a rehab, patient will be better able to help herself and will have more independence and she said that's right. Patient talked about her grandchildren and son, Beverely Low. She shared pictures of her grandchildren and smiled the entire time. Chaplain left because nurse came in to give out meds.   Chaplain went back to visit with patient at lunch time and found patient with tears in her eyes. Chaplain spoke words of encouragement.Chaplain and patient held hands as chaplain tried to encourage patient. She said that she is not giving up and with the Lord's help she is going to get through this. Chaplain thanked patient for allowing her in and patient thanked chaplain. Patient said she will always carry chaplain in her heart.

## 2019-09-30 NOTE — TOC Progression Note (Signed)
Transition of Care [facility]) - Progression Note    Patient Details  Name: CRISS BARTLES MRN: [id number] Date of Birth: 09/18/1945  [facility]) CM/SW Contact  Shelbie Ammons, RN Phone Number: 09/30/2019, 1:56 PM  Clinical Narrative:   RNCM met with patient at bedside to present bed offers, patient chooses [facility]. RNCM accepted bed in the hub and started insurance auth with [facility]. Ref# [id number] and clinicals were faxed to [phone]. RNCM will follow.     Expected Discharge Plan: [place] Barriers to Discharge: Continued Medical Work up  Expected Discharge Plan and Services Expected Discharge Plan: [place]   Discharge Planning Services: CM Consult   Living arrangements for the past 2 months: Single Family Home                 DME Arranged: 3-N-1, Walker rolling DME Agency: [employer] Date DME Agency Contacted: 09/28/19 Time DME Agency Contacted: [phone] Representative spoke with at DME Agency: [place]: PT [place]: [facility] [employer] Date [place]: 09/28/19 Time [place]: 22 Representative spoke with at [place]: [place] ([place]) Interventions    Readmission Risk Interventions No flowsheet data found.

## 2019-09-30 NOTE — Discharge Summary (Addendum)
Physician Discharge Summary  Patient ID: Tina Hayes MRN: [id number] DOB/AGE: 04-May-1945 74 y.o.  Admit date: 09/27/2019 Discharge date: 10/04/2019  Admission Diagnoses:  Status post total knee replacement using cement, left [Z96.652]  Discharge Diagnoses: Patient Active Problem List   Diagnosis Date Noted  . Status post total knee replacement using cement, 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  . Atherosclerosis of native arteries of the extremities with ulceration ([place]) 07/05/2019  . Onychomycosis 05/30/2019  . Occlusion and stenosis of vertebral artery 08/31/2018  . AKI (acute kidney injury) ([place]) 05/25/2018  . BMI 50.0-59.9, adult ([place]) 04/16/2018  . Seizures ([place]) 11/10/2017  . Mass of right lower leg 09/11/2017  . Dysphagia 04/23/2017  . Chronic diastolic congestive heart failure ([place]) 02/13/2017  . Acute diastolic heart failure ([place]) 01/27/2017  . Depression 12/11/2016  . Essential hypertension 06/30/2016  . Snoring 06/30/2016  . Diabetes mellitus ([place]) 06/30/2016  . Coronary artery disease of native artery of native heart with stable angina pectoris ([place]) 06/17/2016  . Morbid obesity ([place]) 06/17/2016  . COPD (chronic obstructive pulmonary disease) ([place]) 08/28/2015  . COPD with acute exacerbation ([place]) 04/14/2015    Past Medical History:  Diagnosis Date  . (HFpEF) heart failure with preserved ejection fraction ([place])    a. 05/2016 Echo: EF 60-65%, mild to mod LVH, Gr1 DD, mild MR, mildly dil LA, mod TR, mildly to mod increased PASP.  [name] Acute diastolic heart failure ([place]) 01/27/2017  . Anxiety   . Arthritis   . Chest pain 06/16/2016  . CHF (congestive heart failure) ([place])   . Chronic back pain   . Chronic diastolic congestive heart failure ([place]) 02/13/2017  . COPD (chronic obstructive pulmonary disease) ([place])   . Coronary artery disease    a. s/p remote PCI x 5;  b. 2006 s/p CABG x 3 ([place]); b. 05/2016 MV: attenuation corrected images w/o ischemia or  wma-->Med rx.  . Coronary artery disease of native artery of native heart with stable angina pectoris ([place]) 06/17/2016  . Depression   . Diabetes mellitus without complication ([place])   . Essential hypertension 06/30/2016  . GERD (gastroesophageal reflux disease)   . Heart attack ([place])    Total of 3 per pt.  . Hypertension   . Hypertensive urgency 06/03/2015  . Seizure ([place])   . Seizures ([place])      Transfusion: None.   Consultants (if any):   Discharged Condition: Improved  Hospital Course: TEREZA GILHAM is an 74 y.o. female who was admitted 09/27/2019 with a diagnosis of primary osteoarthritis of the left knee and went to the operating room on 09/27/2019 and underwent the above named procedures.   Patient continued to run fevers at night, 101 F without white count elevation.  UA and CXR ordered on POD3 demonstrated evidence for UTI, patient was started on Bactrim.  Patient also diagnosed with yeast infection, started on ketoconazole cream applied to the affected areas.   Surgeries: Procedure(s): TOTAL KNEE ARTHROPLASTY on 09/27/2019 Patient tolerated the surgery well. Taken to PACU where she was stabilized and then transferred to the orthopedic floor.  Started on Lovenox 40mg  q 12 hrs. Foot pumps applied bilaterally at 80 mm. Heels elevated on bed with rolled towels. No evidence of DVT. Negative [place]. Physical therapy started on day #1 for gait training and transfer. OT started day #1 for ADL and assisted devices.  Patient's IV was removed on POD6.  Implants: Left TKA using a Signature-guided all-cemented [employer] Vanguard system with a  65 mm PCR femur, a 75 mm tibial tray with an 18 mm anterior stabilized E-poly insert, and a 31 x 8 mm all-poly 3-pegged domed patella.  She was given perioperative antibiotics:  Anti-infectives (From admission, onward)   Start     Dose/Rate Route Frequency Ordered Stop   10/03/19 0000  sulfamethoxazole-trimethoprim (BACTRIM DS) 800-160 MG tablet     1  tablet Oral Every 12 hours 10/03/19 0738     10/01/19 1500  sulfamethoxazole-trimethoprim (BACTRIM DS) 800-160 MG per tablet 1 tablet     1 tablet Oral Every 12 hours 10/01/19 1431 10/06/19 0959   09/27/19 1400  ceFAZolin (ANCEF) 3 g in dextrose 5 % 50 mL IVPB     3 g 100 mL/hr over 30 Minutes Intravenous Every 6 hours 09/27/19 1324 09/28/19 0356   09/27/19 0600  ceFAZolin (ANCEF) 3 g in dextrose 5 % 50 mL IVPB     3 g 100 mL/hr over 30 Minutes Intravenous On call to O.R. 09/27/19 0542 09/27/19 1700    .  She was given sequential compression devices, early ambulation, and Lovenox for DVT prophylaxis.  She benefited maximally from the hospital stay and there were no complications.    Recent vital signs:  Vitals:   10/03/19 0907 10/03/19 2217  BP:  (!) 145/65  Pulse:  66  Resp:  15  Temp:  97.9 F (36.6 C)  SpO2: 100% 96%    Recent laboratory studies:  Lab Results  Component Value Date   HGB 8.9 (L) 09/30/2019   HGB 8.2 (L) 09/29/2019   HGB 8.7 (L) 09/28/2019   Lab Results  Component Value Date   WBC 7.9 09/30/2019   PLT 238 09/30/2019   Lab Results  Component Value Date   INR 1.0 07/22/2019   Lab Results  Component Value Date   NA 143 09/30/2019   K 3.8 09/30/2019   CL 107 09/30/2019   CO2 28 09/30/2019   BUN 14 09/30/2019   CREATININE 1.05 (H) 09/30/2019   GLUCOSE 144 (H) 09/30/2019    Discharge Medications:   Allergies as of 10/04/2019      Reactions   Aspirin Anaphylaxis      Medication List    STOP taking these medications   HYDROcodone-acetaminophen 7.5-325 MG tablet Commonly known as: Burchinal these medications   albuterol (2.5 MG/3ML) 0.083% nebulizer solution Commonly known as: PROVENTIL Take 2.5 mg every 4 (four) hours as needed by nebulization for wheezing or shortness of breath.   albuterol 108 (90 Base) MCG/ACT inhaler Commonly known as: VENTOLIN HFA Inhale 2 puffs into the lungs every 6 (six) hours as needed for wheezing or  shortness of breath.   amLODipine 10 MG tablet Commonly known as: NORVASC Take 1 tablet by mouth once daily   atorvastatin 40 MG tablet Commonly known as: LIPITOR TAKE 1 TABLET BY MOUTH AT BEDTIME   B-D UF III MINI PEN NEEDLES 31G X 5 MM Misc Generic drug: Insulin Pen Needle USE WITH INSULIN PEN INJECTIONS TWICE DAILY   budesonide 0.5 MG/2ML nebulizer solution Commonly known as: PULMICORT Take 0.5 mg by nebulization daily at 12 noon.   carvedilol 3.125 MG tablet Commonly known as: COREG Take 1 tablet (3.125 mg total) by mouth 2 (two) times daily with a meal.   cetirizine 10 MG tablet Commonly known as: ZYRTEC Take 10 mg by mouth daily.   clopidogrel 75 MG tablet Commonly known as: PLAVIX Take 1 tablet (75 mg total)  by mouth daily.   Combivent Respimat 20-100 MCG/ACT Aers respimat Generic drug: Ipratropium-Albuterol Inhale 1 puff into the lungs 4 (four) times daily as needed for wheezing.   Dupixent 300 MG/2ML Sopn Generic drug: Dupilumab Inject 300 mg into the skin every 14 (fourteen) days.   enoxaparin 40 MG/0.4ML injection Commonly known as: LOVENOX Inject 0.4 mLs (40 mg total) into the skin every 12 (twelve) hours.   esomeprazole 40 MG capsule Commonly known as: NEXIUM Take 40 mg by mouth 2 (two) times daily.   ferrous sulfate 325 (65 FE) MG tablet Take 325 mg by mouth daily.   isosorbide mononitrate 30 MG 24 hr tablet Commonly known as: IMDUR Take 1 tablet (30 mg total) by mouth 2 (two) times daily.   Jardiance 10 MG Tabs tablet Generic drug: empagliflozin Take 10 mg by mouth daily.   ketoconazole 2 % cream Commonly known as: NIZORAL Apply topically 2 (two) times daily.   Lantus SoloStar 100 UNIT/ML Solostar Pen Generic drug: insulin glargine Inject 30-40 Units into the skin See admin instructions. Inject 30 units subcutaneously in the morning at 40 units subcutaneously at bedtime.   levETIRAcetam 750 MG tablet Commonly known as: KEPPRA Take 750  mg by mouth 2 (two) times daily.   losartan 100 MG tablet Commonly known as: COZAAR Take 1 tablet (100 mg total) by mouth daily.   meloxicam 7.5 MG tablet Commonly known as: MOBIC Take 7.5 mg by mouth daily as needed for pain.   mometasone-formoterol 200-5 MCG/ACT Aero Commonly known as: DULERA Inhale 2 puffs into the lungs 2 (two) times daily.   montelukast 10 MG tablet Commonly known as: SINGULAIR Take 10 mg by mouth daily.   nitroGLYCERIN 0.4 MG SL tablet Commonly known as: NITROSTAT DISSOLVE ONE TABLET UNDER THE TONGUE EVERY 5 MINUTES AS NEEDED FOR CHEST PAIN.  DO NOT EXCEED A TOTAL OF 3 DOSES IN 15 MINUTES What changed: See the new instructions.   NovoLOG FlexPen 100 UNIT/ML FlexPen Generic drug: insulin aspart Inject 10 Units into the skin 3 (three) times daily with meals.   ondansetron 4 MG tablet Commonly known as: ZOFRAN Take 1 tablet (4 mg total) by mouth every 6 (six) hours as needed for nausea.   oxyCODONE 5 MG immediate release tablet Commonly known as: Oxy IR/ROXICODONE Take 1-2 tablets (5-10 mg total) by mouth every 4 (four) hours as needed for moderate pain.   potassium chloride SA 20 MEQ tablet Commonly known as: KLOR-CON Take 2 tablets (40 mEq total) by mouth 2 (two) times daily. Take extra 2 tablets (40 meq) if you take metolazone   rOPINIRole 1 MG tablet Commonly known as: REQUIP Take 1 mg by mouth at bedtime.   sertraline 100 MG tablet Commonly known as: ZOLOFT Take 100 mg by mouth at bedtime.   sulfamethoxazole-trimethoprim 800-160 MG tablet Commonly known as: BACTRIM DS Take 1 tablet by mouth every 12 (twelve) hours.   tiotropium 18 MCG inhalation capsule Commonly known as: SPIRIVA Place 18 mcg into inhaler and inhale daily.   torsemide 20 MG tablet Commonly known as: DEMADEX Take 3 tablets (60 mg total) by mouth 2 (two) times daily.   traMADol 50 MG tablet Commonly known as: ULTRAM Take 1 tablet (50 mg total) by mouth every 6 (six)  hours.   traZODone 50 MG tablet Commonly known as: DESYREL Take 50 mg by mouth at bedtime.            Durable Medical Equipment  (From admission, onward)  Start     Ordered   09/27/19 1513  DME Bedside commode  Once    Question:  Patient needs a bedside commode to treat with the following condition  Answer:  Status post total knee replacement using cement, left   09/27/19 1512   09/27/19 1513  DME 3 n 1  Once     09/27/19 1512   09/27/19 1513  DME Walker rolling  Once    Question Answer Comment  Walker: With 5 Inch Wheels   Patient needs a walker to treat with the following condition Status post total knee replacement using cement, left      09/27/19 1512          Diagnostic Studies: DG Chest Port 1 View  Result Date: 09/30/2019 CLINICAL DATA:  Fever EXAM: PORTABLE CHEST 1 VIEW COMPARISON:  February 01, 2019 FINDINGS: Lungs are clear. Heart is upper normal in size with pulmonary vascularity normal. Patient is status post coronary artery bypass grafting. There is a hiatal type hernia. No adenopathy. Calcification is noted in each carotid artery. No bone lesions. IMPRESSION: Lungs clear. Heart upper normal in size. Hiatal hernia present. Carotid artery calcification noted bilaterally. Status post coronary artery bypass grafting. Electronically Signed   By: Lowella Grip III M.D.   On: 09/30/2019 10:54   DG Knee Left Port  Result Date: 09/27/2019 CLINICAL DATA:  Status post total left knee replacement. EXAM: PORTABLE LEFT KNEE - 1-2 VIEW COMPARISON:  06/08/2019 left knee CT. FINDINGS: Sequela of interval cemented left total knee arthroplasty. Hardware components are well seated and articulate appropriately. No concerning perihardware lucency. Intra-articular and subcutaneous emphysema reflects postsurgical sequela. Small joint effusion. Diffuse soft tissue swelling. Midline anterior knee skin staples. IMPRESSION: Sequela of interval left total knee arthroplasty without  immediate postoperative complication. Electronically Signed   By: Primitivo Gauze M.D.   On: 09/27/2019 10:53   Disposition: Plan for discharge to SNF today pending bed availability.   Contact information for follow-up providers    Duanne Guess, PA-C On 10/12/2019.   Specialties: Orthopedic Surgery, Emergency Medicine Why: Staple Removal.;  @ 9:15 am Contact information: [place] [address] [phone]            Contact information for after-discharge care    [facility].   Service: Skilled Nursing Contact information: [place] [phone]                 Signed: Judson Roch PA-C 10/04/2019, 7:31 AM

## 2019-09-30 NOTE — Progress Notes (Signed)
   09/30/19 1210  Assess: if the MEWS score is Yellow or Red  Were vital signs taken at a resting state? Yes  Focused Assessment Documented focused assessment  Early Detection of Sepsis Score *See Row Information* Low  MEWS guidelines implemented *See Row Information* Yes  Treat  MEWS Interventions Other (Comment) (New order 500cc bolus)  Take Vital Signs  Increase Vital Sign Frequency  Yellow: Q 2hr X 2 then Q 4hr X 2, if remains yellow, continue Q 4hrs  Escalate  MEWS: Escalate Yellow: discuss with charge nurse/RN and consider discussing with provider and RRT  Notify: Charge Nurse/RN  Name of Charge Nurse/RN Notified Claiborne Billings  Date Charge Nurse/RN Notified 09/30/19  Time Charge Nurse/RN Notified  (12:42)  Notify: Provider  Provider Name/Title Elenore Rota, MD  Date Provider Notified 09/30/19  Time Provider Notified 1232  Notification Type Page  Notification Reason Other (Comment) (BP low with therapy)  Response See new orders  Date of Provider Response 09/30/19  Time of Provider Response  (12:14)  Document  Progress note created (see row info) Yes

## 2019-09-30 NOTE — Progress Notes (Signed)
Physical Therapy Treatment Patient Details Name: Tina Hayes MRN: [id number] DOB: 05/29/1945 Today's Date: 09/30/2019    History of Present Illness Pt admitted for L TKR. 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    PT Comments    Tech in taking BP upon arrival 130/58 L arm.  Bolus running.  Participated in exercises as described below.  Pt wanting to try EOB.  Min guard x 1 to EOB.  Steady in sitting.  Minimal dizziness noted that does not change with time initially but she does report some worsening around 5 minutes sitting when she was encouraged to return to supine with min a x 1.  Ankle pumps, LAQ and stretching L knee in sitting.  Overall decreased pain and increased tolerance today.  She is limited today in mobility as OOB and standing deferred due to dizziness and BP.  May consider orthostatic BP's tomorrow but given low BP this am and receiving bolus, anticipate orthostatic is not the cause at this time.  Will continue as appropriate tomorrow.      Follow Up Recommendations  SNF     Equipment Recommendations  Rolling walker with 5" wheels;3in1 (PT);Wheelchair (measurements PT)    Recommendations for Other Services       Precautions / Restrictions Precautions Precautions: Knee;Fall Precaution Booklet Issued: Yes (comment) Precaution Comments: KI on for mobility Required Braces or Orthoses: Knee Immobilizer - Left Knee Immobilizer - Left: On when out of bed or walking Restrictions Weight Bearing Restrictions: Yes LLE Weight Bearing: Weight bearing as tolerated    Mobility  Bed Mobility Overal bed mobility: Needs Assistance Bed Mobility: Supine to Sit;Sit to Supine     Supine to sit: Min assist;Min guard Sit to supine: Min assist;Mod assist   General bed mobility comments: assist for LE's  Transfers                 General transfer comment: deferred due to dizziness and low BP this am  Ambulation/Gait                 Stairs              Wheelchair Mobility    Modified Rankin (Stroke Patients Only)       Balance Overall balance assessment: Needs assistance Sitting-balance support: Feet supported;Single extremity supported Sitting balance-Leahy Scale: Good Sitting balance - Comments: Steady static sitting, reaching within BOS.                                    Cognition Arousal/Alertness: Awake/alert Behavior During Therapy: WFL for tasks assessed/performed Overall Cognitive Status: Within Functional Limits for tasks assessed                                        Exercises Total Joint Exercises Goniometric ROM: 0-70 with increased tolerance and effort this session Other Exercises Other Exercises: supine ther-ex performed on L LE including AP, quad sets, SAQ, hip abd/add, and SLRs. ALl ther-ex performed x 12 reps with cga.     General Comments        Pertinent Vitals/Pain Pain Assessment: Faces Faces Pain Scale: Hurts little more Pain Location: L knee Pain Descriptors / Indicators: Operative site guarding;Sore;Aching Pain Intervention(s): Limited activity within patient's tolerance;Monitored during session    Home Living  Prior Function            PT Goals (current goals can now be found in the care plan section) Progress towards PT goals: Not progressing toward goals - comment    Frequency    BID      PT Plan Current plan remains appropriate    Co-evaluation              AM-PAC PT "6 Clicks" Mobility   Outcome Measure  Help needed turning from your back to your side while in a flat bed without using bedrails?: A Little Help needed moving from lying on your back to sitting on the side of a flat bed without using bedrails?: A Little Help needed moving to and from a bed to a chair (including a wheelchair)?: A Little Help needed standing up from a chair using your arms (e.g., wheelchair or bedside chair)?:  A Little Help needed to walk in hospital room?: A Little Help needed climbing 3-5 steps with a railing? : Total 6 Click Score: 16    End of Session Equipment Utilized During Treatment: Gait belt Activity Tolerance: Treatment limited secondary to medical complications (Comment) Patient left: in bed;with call bell/phone within reach;with bed alarm set;with SCD's reapplied Nurse Communication: Other (comment) Pain - Right/Left: Left Pain - part of body: Knee     Time: 4784-1282 PT Time Calculation (min) (ACUTE ONLY): 17 min  Charges:  $Therapeutic Exercise: 8-22 mins $Therapeutic Activity: 8-22 mins                    Chesley Noon, PTA 09/30/19, 2:36 PM

## 2019-10-01 LAB — GLUCOSE, CAPILLARY
Glucose-Capillary: 173 mg/dL — ABNORMAL HIGH (ref 70–99)
Glucose-Capillary: 179 mg/dL — ABNORMAL HIGH (ref 70–99)
Glucose-Capillary: 226 mg/dL — ABNORMAL HIGH (ref 70–99)
Glucose-Capillary: 145 mg/dL — ABNORMAL HIGH (ref 70–99)

## 2019-10-01 MED ORDER — SULFAMETHOXAZOLE-TRIMETHOPRIM 800-160 MG PO TABS
1.0000 | ORAL_TABLET | Freq: Two times a day (BID) | ORAL | Status: DC
  Administered 2019-10-01 – 2019-10-04 (×6): 1 via ORAL
  Filled 2019-10-01 (×6): qty 1

## 2019-10-01 NOTE — Progress Notes (Signed)
Physical Therapy Treatment Patient Details Name: Tina Hayes MRN: [id number] DOB: [birth year] Today's Date: 10/01/2019    History of Present Illness Pt admitted for L TKR. 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    PT Comments    Pt is making limited progress towards goals with ability to ambulate to recliner this date. BP stable at 134/61 and no complaints of dizziness. Pt very anxious with all mobility and takes increased encouragement to perform OOB mobility due to fear. Limited progress with AAROM due to pain. Pt becomes emotional and tearful wanting to go home vs SNF. Educated on benefits of a facility and pt eventually agreeable. Will continue to progress as able.   Follow Up Recommendations  SNF     Equipment Recommendations  Rolling walker with 5" wheels;3in1 (PT);Wheelchair (measurements PT)    Recommendations for Other Services       Precautions / Restrictions Precautions Precautions: Knee;Fall Precaution Booklet Issued: Yes (comment) Precaution Comments: KI on for mobility Required Braces or Orthoses: Knee Immobilizer - Left Knee Immobilizer - Left: On when out of bed or walking Restrictions Weight Bearing Restrictions: Yes LLE Weight Bearing: Weight bearing as tolerated    Mobility  Bed Mobility Overal bed mobility: Needs Assistance Bed Mobility: Supine to Sit;Sit to Supine     Supine to sit: Min assist;Min guard     General bed mobility comments: decreased cues required for mobility. Needs assist for L LE lowering to ground. ONce seated, able to sit with upright posture  Transfers Overall transfer level: Needs assistance Equipment used: Rolling walker (2 wheeled) Transfers: Sit to/from Stand Sit to Stand: Mod assist         General transfer comment: cues for sequencing. Once standing, upright posture. No complaints of dizziness  Ambulation/Gait Ambulation/Gait assistance: Min assist Gait Distance (Feet): 3 Feet Assistive  device: Rolling walker (2 wheeled) Gait Pattern/deviations: Step-to pattern;Trunk flexed     General Gait Details: short steps over to recliner. RW used. Takes extended time   Marine scientist Rankin (Stroke Patients Only)       Balance Overall balance assessment: Needs assistance Sitting-balance support: Feet supported;Single extremity supported Sitting balance-Leahy Scale: Good Sitting balance - Comments: Steady static sitting, reaching within BOS.   Standing balance support: Bilateral upper extremity supported Standing balance-Leahy Scale: Fair                              Cognition Arousal/Alertness: Awake/alert Behavior During Therapy: WFL for tasks assessed/performed Overall Cognitive Status: Within Functional Limits for tasks assessed                                        Exercises Total Joint Exercises Goniometric ROM: L AAROM: 2-70 degrees. Flexion pain limited Other Exercises Other Exercises: supine ther-ex performed on L LE including AP, quad sets, hip abd/add, and SLRs. ALl ther-ex performed x 15 reps with cga.     General Comments        Pertinent Vitals/Pain Pain Assessment: 0-10 Pain Score: 5  Pain Location: L knee Pain Descriptors / Indicators: Operative site guarding;Sore;Aching Pain Intervention(s): Limited activity within patient's tolerance;Ice applied;Repositioned    Home Living  Prior Function            PT Goals (current goals can now be found in the care plan section) Acute Rehab PT Goals Patient Stated Goal: to go home PT Goal Formulation: With patient Time For Goal Achievement: 10/11/19 Potential to Achieve Goals: Good Progress towards PT goals: Progressing toward goals    Frequency    BID      PT Plan Current plan remains appropriate    Co-evaluation              AM-PAC PT "6 Clicks" Mobility   Outcome  Measure  Help needed turning from your back to your side while in a flat bed without using bedrails?: A Little Help needed moving from lying on your back to sitting on the side of a flat bed without using bedrails?: A Little Help needed moving to and from a bed to a chair (including a wheelchair)?: A Lot Help needed standing up from a chair using your arms (e.g., wheelchair or bedside chair)?: A Lot Help needed to walk in hospital room?: Total Help needed climbing 3-5 steps with a railing? : Total 6 Click Score: 12    End of Session Equipment Utilized During Treatment: Gait belt Activity Tolerance: Treatment limited secondary to medical complications (Comment) Patient left: in chair;with chair alarm set Nurse Communication: Mobility status PT Visit Diagnosis: Unsteadiness on feet (R26.81);Muscle weakness (generalized) (M62.81);Difficulty in walking, not elsewhere classified (R26.2);Pain Pain - Right/Left: Left Pain - part of body: Knee     Time: 1594-5859 PT Time Calculation (min) (ACUTE ONLY): 29 min  Charges:  $Gait Training: 8-22 mins $Therapeutic Exercise: 8-22 mins                     Greggory Stallion, PT, DPT [phone]    Tina Hayes 10/01/2019, 9:52 AM

## 2019-10-01 NOTE — Progress Notes (Signed)
°  Subjective: 4 Days Post-Op Procedure(s) (LRB): TOTAL KNEE ARTHROPLASTY (Left) Patient reports pain as moderate.   Patient is well but has complaints of increased urination. Plan is to go Skilled nursing facility after hospital stay. Negative for chest pain and shortness of breath Fever: no Gastrointestinal: negative for nausea and vomiting.   Patient reports she has had a bowel movement.  Objective: Vital signs in last 24 hours: Temp:  [98.1 F (36.7 C)-99.8 F (37.7 C)] 98.4 F (36.9 C) (06/05 0819) Pulse Rate:  [59-65] 63 (06/05 0819) Resp:  [12-18] 16 (06/05 0819) BP: (92-148)/(29-120) 141/50 (06/05 0819) SpO2:  [95 %-100 %] 99 % (06/05 0915)  Intake/Output from previous day:  Intake/Output Summary (Last 24 hours) at 10/01/2019 1427 Last data filed at 10/01/2019 1415 Gross per 24 hour  Intake 480 ml  Output 800 ml  Net -320 ml    Intake/Output this shift: Total I/O In: 240 [P.O.:240] Out: -   Labs: Recent Labs    09/29/19 0636 09/30/19 0622  HGB 8.2* 8.9*   Recent Labs    09/29/19 0636 09/30/19 0622  WBC 6.6 7.9  RBC 3.09* 3.32*  HCT 26.4* 28.3*  PLT 192 238   Recent Labs    09/29/19 0636 09/30/19 0622  NA 144 143  K 3.5 3.8  CL 110 107  CO2 25 28  BUN 10 14  CREATININE 0.85 1.05*  GLUCOSE 89 144*  CALCIUM 8.2* 8.2*   No results for input(s): LABPT, INR in the last 72 hours.   EXAM General - Patient is Alert, Appropriate and Oriented Extremity - Neurovascular intact Dorsiflexion/Plantar flexion intact Compartment soft; knee immobilizer in place  Dressing/Incision -clean, dry, mild sanguinous drainage. Motor Function - intact, moving foot and toes well on exam.    Assessment/Plan: 4 Days Post-Op Procedure(s) (LRB): TOTAL KNEE ARTHROPLASTY (Left) Active Problems:   Status post total knee replacement using cement, left  Estimated body mass index is 40.71 kg/m as calculated from the following:   Height as of this encounter: 5\' 11"   (1.803 m).   Weight as of this encounter: 132.4 kg. Advance diet Up with therapy Discharge to SNF pending insurance approval.  -UTI Review of UA reveals moderate leukocytes. Bactrim DS ordered for UTI.  DVT Prophylaxis - Lovenox Weight-Bearing as tolerated to left leg  Cassell Smiles, PA-C [facility] Orthopaedic Surgery 10/01/2019, 2:27 PM

## 2019-10-01 NOTE — Progress Notes (Signed)
Physical Therapy Treatment Patient Details Name: Tina Hayes MRN: [id number] DOB: [birth year] Today's Date: 10/01/2019    History of Present Illness Pt admitted for L TKR. 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    PT Comments    Pt is making gradual progress towards goals with improved ambulation distance this date due to decreased pain. Pt performed supine there-ex, however noticed the bed was soaked, needing complete change. Changed gown and donned diaper and left in recliner for tech to change bed. Will continue to progress.   Follow Up Recommendations  SNF     Equipment Recommendations  Rolling walker with 5" wheels;3in1 (PT);Wheelchair (measurements PT)    Recommendations for Other Services       Precautions / Restrictions Precautions Precautions: Knee;Fall Precaution Booklet Issued: Yes (comment) Precaution Comments: KI on for mobility Required Braces or Orthoses: Knee Immobilizer - Left Knee Immobilizer - Left: On when out of bed or walking Restrictions Weight Bearing Restrictions: Yes LLE Weight Bearing: Weight bearing as tolerated    Mobility  Bed Mobility Overal bed mobility: Needs Assistance Bed Mobility: Supine to Sit;Sit to Supine     Supine to sit: Min assist;Min guard     General bed mobility comments: improved technique with smooth sequencing. Once seated, able to demonstrate upright posture  Transfers Overall transfer level: Needs assistance Equipment used: Rolling walker (2 wheeled) Transfers: Sit to/from Stand Sit to Stand: Mod assist         General transfer comment: cues for sequencing. Once standing, upright posture. No complaints of dizziness  Ambulation/Gait Ambulation/Gait assistance: Min assist Gait Distance (Feet): 10 Feet Assistive device: Rolling walker (2 wheeled) Gait Pattern/deviations: Step-to pattern;Trunk flexed     General Gait Details: short steps, KI donned. Safe technique, however fatigues  quickly. Heavy B UE support on RW   Stairs             Wheelchair Mobility    Modified Rankin (Stroke Patients Only)       Balance Overall balance assessment: Needs assistance Sitting-balance support: Feet supported;Single extremity supported Sitting balance-Leahy Scale: Good     Standing balance support: Bilateral upper extremity supported                                Cognition Arousal/Alertness: Awake/alert Behavior During Therapy: WFL for tasks assessed/performed Overall Cognitive Status: Within Functional Limits for tasks assessed                                        Exercises Other Exercises Other Exercises: supine ther-ex performed on L LE including AP, quad sets, hip abd/add, SAQ and SLRs. ALl ther-ex performed x 15 reps with cga.  Other Exercises: due to incontinence, donned diaper for mobility purposes. Bed soaked in urine. Needed gown change    General Comments        Pertinent Vitals/Pain Pain Assessment: 0-10 Pain Score: 2  Pain Location: L knee Pain Descriptors / Indicators: Operative site guarding;Sore;Aching Pain Intervention(s): Limited activity within patient's tolerance    Home Living                      Prior Function            PT Goals (current goals can now be found in the care plan section)  Acute Rehab PT Goals Patient Stated Goal: to go home PT Goal Formulation: With patient Time For Goal Achievement: 10/11/19 Potential to Achieve Goals: Good Progress towards PT goals: Progressing toward goals    Frequency    BID      PT Plan Current plan remains appropriate    Co-evaluation              AM-PAC PT "6 Clicks" Mobility   Outcome Measure  Help needed turning from your back to your side while in a flat bed without using bedrails?: A Little Help needed moving from lying on your back to sitting on the side of a flat bed without using bedrails?: A Little Help needed  moving to and from a bed to a chair (including a wheelchair)?: A Lot Help needed standing up from a chair using your arms (e.g., wheelchair or bedside chair)?: A Lot Help needed to walk in hospital room?: Total Help needed climbing 3-5 steps with a railing? : Total 6 Click Score: 12    End of Session Equipment Utilized During Treatment: Gait belt Activity Tolerance: Treatment limited secondary to medical complications (Comment) Patient left: in chair;with chair alarm set Nurse Communication: Mobility status PT Visit Diagnosis: Unsteadiness on feet (R26.81);Muscle weakness (generalized) (M62.81);Difficulty in walking, not elsewhere classified (R26.2);Pain Pain - Right/Left: Left Pain - part of body: Knee     Time: 1319-1350 PT Time Calculation (min) (ACUTE ONLY): 31 min  Charges:  $Gait Training: 8-22 mins $Therapeutic Exercise: 8-22 mins                     Greggory Stallion, PT, DPT [phone]    Tina Hayes 10/01/2019, 2:08 PM

## 2019-10-01 NOTE — Plan of Care (Signed)
  Problem: Education: Goal: Knowledge of General Education information will improve Description: Including pain rating scale, medication(s)/side effects and non-pharmacologic comfort measures Outcome: Progressing   Problem: Clinical Measurements: Goal: Ability to maintain clinical measurements within normal limits will improve Outcome: Progressing Goal: Will remain free from infection Outcome: Progressing Goal: Diagnostic test results will improve Outcome: Progressing   Problem: Activity: Goal: Risk for activity intolerance will decrease Outcome: Progressing   Problem: Coping: Goal: Level of anxiety will decrease Outcome: Progressing   Problem: Pain Managment: Goal: General experience of comfort will improve Outcome: Progressing   Problem: Safety: Goal: Ability to remain free from injury will improve Outcome: Progressing   

## 2019-10-01 NOTE — Plan of Care (Signed)
  Problem: Activity: Goal: Risk for activity intolerance will decrease Outcome: Progressing   Problem: Coping: Goal: Level of anxiety will decrease Outcome: Progressing   Problem: Pain Managment: Goal: General experience of comfort will improve Outcome: Progressing   Problem: Safety: Goal: Ability to remain free from injury will improve Outcome: Progressing   

## 2019-10-02 LAB — GLUCOSE, CAPILLARY
Glucose-Capillary: 149 mg/dL — ABNORMAL HIGH (ref 70–99)
Glucose-Capillary: 238 mg/dL — ABNORMAL HIGH (ref 70–99)
Glucose-Capillary: 133 mg/dL — ABNORMAL HIGH (ref 70–99)

## 2019-10-02 MED ORDER — KETOCONAZOLE 2 % EX CREA
TOPICAL_CREAM | Freq: Two times a day (BID) | CUTANEOUS | Status: DC
  Filled 2019-10-02 (×2): qty 15

## 2019-10-02 MED ORDER — ALUM & MAG HYDROXIDE-SIMETH 200-200-20 MG/5ML PO SUSP
30.0000 mL | Freq: Four times a day (QID) | ORAL | Status: DC | PRN
  Administered 2019-10-04: 30 mL via ORAL
  Filled 2019-10-02: qty 30

## 2019-10-02 NOTE — Progress Notes (Addendum)
  Subjective: 5 Days Post-Op Procedure(s) (LRB): TOTAL KNEE ARTHROPLASTY (Left) Patient reports pain as moderate.   Patient is well, but has had some minor complaints of nausea as well as a rash between her legs extending to her buttocks. She continues to have urinary frequency. Plan is to go Skilled nursing facility after hospital stay. Negative for chest pain and shortness of breath Fever: no Gastrointestinal: positive for nausea, no vomiting  Patient has had a bowel movement.  Objective: Vital signs in last 24 hours: Temp:  [98.3 F (36.8 C)-98.8 F (37.1 C)] 98.3 F (36.8 C) (06/06 0909) Pulse Rate:  [62-69] 66 (06/06 0909) Resp:  [17-19] 19 (06/06 0909) BP: (105-117)/(56-64) 117/64 (06/06 0909) SpO2:  [97 %-100 %] 99 % (06/06 0909)  Intake/Output from previous day:  Intake/Output Summary (Last 24 hours) at 10/02/2019 1213 Last data filed at 10/01/2019 1415 Gross per 24 hour  Intake 120 ml  Output --  Net 120 ml    Intake/Output this shift: No intake/output data recorded.  Labs: Recent Labs    09/30/19 0622  HGB 8.9*   Recent Labs    09/30/19 0622  WBC 7.9  RBC 3.32*  HCT 28.3*  PLT 238   Recent Labs    09/30/19 0622  NA 143  K 3.8  CL 107  CO2 28  BUN 14  CREATININE 1.05*  GLUCOSE 144*  CALCIUM 8.2*   No results for input(s): LABPT, INR in the last 72 hours.   EXAM General - Patient is Alert, Appropriate and Oriented Extremity - Neurovascular intact Dorsiflexion/Plantar flexion intact Compartment soft; No TED hose on operative extremity Dressing/Incision -mild sanguinous drainage noted. Motor Function - intact, moving foot and toes well on exam.  Cardiovascular- Regular rate and rhythm, no murmurs/rubs/gallops Respiratory- Lungs clear to auscultation bilaterally Gastrointestinal- soft and nontender   Assessment/Plan: 5 Days Post-Op Procedure(s) (LRB): TOTAL KNEE ARTHROPLASTY (Left) Active Problems:   Status post total knee replacement  using cement, left  Estimated body mass index is 40.71 kg/m as calculated from the following:   Height as of this encounter: 5\' 11"  (1.803 m).   Weight as of this encounter: 132.4 kg. Advance diet Up with therapy Discharge to SNF pending insurance authorization. Fresh honeycomb dressing applied.  Zofran for nausea per standing order.  Candidiasis -ketoconazole cream    DVT Prophylaxis - Lovenox and Ted hose Weight-Bearing as tolerated to left leg  Cassell Smiles, PA-C [facility] Orthopaedic Surgery 10/02/2019, 12:13 PM

## 2019-10-02 NOTE — Plan of Care (Signed)
  Problem: Activity: Goal: Risk for activity intolerance will decrease Outcome: Progressing   Problem: Coping: Goal: Level of anxiety will decrease Outcome: Progressing   Problem: Pain Managment: Goal: General experience of comfort will improve Outcome: Progressing   Problem: Safety: Goal: Ability to remain free from injury will improve Outcome: Progressing   

## 2019-10-02 NOTE — Progress Notes (Signed)
Physical Therapy Treatment Patient Details Name: Tina Hayes MRN: [id number] DOB: 07-29-1945 Today's Date: 10/02/2019    History of Present Illness Pt admitted for L TKR. 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    PT Comments    Pt in bed, clothing, personal brief and bed saturated with urine. Participated in exercises as described below. She is able to sit on EOB with rail and no assist.  Stood with RW and bed elevated with min a x 1.  She transfers to commode and is able to attend to clothing and most care needs in standing and sitting with no LOB or buckling.  She does lean quite forward at times with tasks but does not loose her balance.  Assisted with full bath prior to transferring to recliner for breakfast. No LOB or buckling today and generally steady.  She declined further gait today.   Follow Up Recommendations  SNF     Equipment Recommendations  3in1 (PT);Wheelchair (measurements PT);Rolling walker with 5" wheels    Recommendations for Other Services       Precautions / Restrictions Precautions Precautions: Knee;Fall Precaution Booklet Issued: Yes (comment) Precaution Comments: KI on for mobility Required Braces or Orthoses: Knee Immobilizer - Left Restrictions Weight Bearing Restrictions: Yes LLE Weight Bearing: Weight bearing as tolerated Other Position/Activity Restrictions: KI left off for transfers on/off commode and for bathing.  no buckling noted without KI.  Talked with primary PT and OK to transfer/gait wihtout it if stable.  Pt buckled first session so it was recommended for safety.    Mobility  Bed Mobility Overal bed mobility: Needs Assistance Bed Mobility: Sidelying to Sit;Supine to Sit     Supine to sit: Supervision;HOB elevated        Transfers Overall transfer level: Needs assistance Equipment used: Rolling walker (2 wheeled) Transfers: Sit to/from Stand Sit to Stand: Min assist;From elevated surface          General transfer comment: cues for sequencing. Once standing, upright posture. No complaints of dizziness  Ambulation/Gait Ambulation/Gait assistance: Min assist Gait Distance (Feet): 3 Feet Assistive device: Rolling walker (2 wheeled) Gait Pattern/deviations: Step-to pattern;Trunk flexed Gait velocity: decreased   General Gait Details: Ki left off due to inc urine and saturated clothing needing bath and voiding on commode   Stairs             Wheelchair Mobility    Modified Rankin (Stroke Patients Only)       Balance Overall balance assessment: Needs assistance Sitting-balance support: Feet supported;Single extremity supported Sitting balance-Leahy Scale: Good     Standing balance support: Bilateral upper extremity supported Standing balance-Leahy Scale: Fair Standing balance comment: Requires heavy BUE support on RW.                            Cognition Arousal/Alertness: Awake/alert Behavior During Therapy: WFL for tasks assessed/performed Overall Cognitive Status: Within Functional Limits for tasks assessed                                        Exercises Other Exercises Other Exercises: supine ther-ex performed on L LE including AP, quad sets, hip abd/add, SAQ and SLRs. ALl ther-ex performed x 15 reps with cga.  Other Exercises: to commode for full bath and clothing change    General Comments  Pertinent Vitals/Pain Pain Assessment: Faces Faces Pain Scale: Hurts little more Pain Location: L knee Pain Descriptors / Indicators: Operative site guarding;Sore;Aching Pain Intervention(s): Limited activity within patient's tolerance;Monitored during session;Repositioned;Ice applied    Home Living                      Prior Function            PT Goals (current goals can now be found in the care plan section) Progress towards PT goals: Progressing toward goals    Frequency    BID      PT Plan       Co-evaluation              AM-PAC PT "6 Clicks" Mobility   Outcome Measure  Help needed turning from your back to your side while in a flat bed without using bedrails?: A Little Help needed moving from lying on your back to sitting on the side of a flat bed without using bedrails?: A Little Help needed moving to and from a bed to a chair (including a wheelchair)?: A Little Help needed standing up from a chair using your arms (e.g., wheelchair or bedside chair)?: A Little Help needed to walk in hospital room?: A Little Help needed climbing 3-5 steps with a railing? : Total 6 Click Score: 16    End of Session Equipment Utilized During Treatment: Gait belt Activity Tolerance: Patient tolerated treatment well Patient left: in chair;with chair alarm set;with call bell/phone within reach Nurse Communication: Mobility status Pain - Right/Left: Left Pain - part of body: Knee     Time: 0825-0856 PT Time Calculation (min) (ACUTE ONLY): 31 min  Charges:  $Therapeutic Exercise: 8-22 mins $Therapeutic Activity: 8-22 mins                    Chesley Noon, PTA 10/02/19, 9:53 AM

## 2019-10-02 NOTE — Progress Notes (Signed)
   10/02/19 1230  Clinical Encounter Type  Visited With Patient  Visit Type Follow-up;Spiritual support  Referral From Other (Comment)  Consult/Referral To Chaplain  Chaplain received a page, patient requested to talk to a chaplain. When chaplain arrived, patient informed her that she had a rough night, experiencing a lot of pain. Patient was sad and upset. Chaplain talked with patient for over forty minutes. Patient talked about her care at [facility], her son, Beverely Low and her grandchildren. Patient is anxious to get home because her grandchildren miss her and cry on the phone bragging her to come home. No matter how she feels, talking about her grandchildren puts a smile on her face. Patient is preparing to for discharge as soon as a bed is found for her. She will probably be in rehab for two weeks. Patient gave chaplain her phone number and asked her to keep in touch, saying she needs someone to talk to. Chaplain will follow up with patient.

## 2019-10-03 LAB — GLUCOSE, CAPILLARY
Glucose-Capillary: 158 mg/dL — ABNORMAL HIGH (ref 70–99)
Glucose-Capillary: 186 mg/dL — ABNORMAL HIGH (ref 70–99)
Glucose-Capillary: 229 mg/dL — ABNORMAL HIGH (ref 70–99)
Glucose-Capillary: 169 mg/dL — ABNORMAL HIGH (ref 70–99)
Glucose-Capillary: 169 mg/dL — ABNORMAL HIGH (ref 70–99)

## 2019-10-03 MED ORDER — ONDANSETRON HCL 4 MG PO TABS: 4.0000 mg | ORAL_TABLET | Freq: Four times a day (QID) | ORAL | 0 refills | Status: DC | PRN

## 2019-10-03 MED ORDER — KETOCONAZOLE 2 % EX CREA: TOPICAL_CREAM | Freq: Two times a day (BID) | CUTANEOUS | 0 refills | Status: DC

## 2019-10-03 MED ORDER — SULFAMETHOXAZOLE-TRIMETHOPRIM 800-160 MG PO TABS: 1.0000 | ORAL_TABLET | Freq: Two times a day (BID) | ORAL | 0 refills | Status: DC

## 2019-10-03 NOTE — Progress Notes (Signed)
OT Cancellation Note  Patient Details Name: RHETA HEMMELGARN MRN: [id number] DOB: [birth year]   Cancelled Treatment:    Reason Eval/Treat Not Completed: Patient declined, no reason specified  Pt checked on with PTA this AM for a Co-Tx session. Pt declines therapy at this time citing L knee pain and feeling light headed when she gets up. OT facilitates education re: importance of getting up more regularly to improve tolerance, but pt still declines. Will f/u at later date/time as able for OT treatment. Thank you.  Gerrianne Scale, [place], OTR/L ascom [phone] 10/03/19, 11:00 AM

## 2019-10-03 NOTE — TOC Transition Note (Signed)
Transition of Care [facility]) - CM/SW Discharge Note   Patient Details  Name: CARYN GIENGER MRN: [id number] Date of Birth: Jan 14, 1946  Transition of Care [facility]) CM/SW Contact:  Su Hilt, RN Phone Number: 10/03/2019, 4:23 PM   Clinical Narrative:    Spoke with the patient and her son and explained insurance is pending and we do not have approval, we will not get approval this late in the day but anticipate tomorrow, They stated understanding     Barriers to Discharge: Continued Medical Work up   Patient Goals and CMS Choice Patient states their goals for this hospitalization and ongoing recovery are:: go home      Discharge Placement                       Discharge Plan and Services   Discharge Planning Services: CM Consult            DME Arranged: 3-N-1, [place] rolling DME Agency: [employer] Date DME Agency Contacted: 09/28/19 Time DME Agency Contacted: [phone] Representative spoke with at DME Agency: [place]: PT [place]: [facility] [employer] Date [place]: 09/28/19 Time [place]: 64 Representative spoke with at [place]: [place] ([place]) Interventions     Readmission Risk Interventions No flowsheet data found.

## 2019-10-03 NOTE — TOC Progression Note (Signed)
Transition of Care [facility] - Progression Note    Patient Details  Name: Tina Hayes MRN: [id number] Date of Birth: 1945-10-16  Transition of Care [facility]) CM/SW Contact  Su Hilt, RN Phone Number: 10/03/2019, 8:45 AM  Clinical Narrative:     [place] to check on the status of the insurance approval, ref number [id number], the Josem Kaufmann is still under review, I spoke to a clinician and let them know I have a dc and need the approval ASAP   Expected Discharge Plan: [place] Barriers to Discharge: Continued Medical Work up  Expected Discharge Plan and Services Expected Discharge Plan: [place]   Discharge Planning Services: CM Consult   Living arrangements for the past 2 months: Single Family Home Expected Discharge Date: 10/03/19               DME Arranged: Berta Minor rolling DME Agency: [employer] Date DME Agency Contacted: 09/28/19 Time DME Agency Contacted: [phone] Representative spoke with at DME Agency: [place]: PT [place]: [facility] [employer] Date [place]: 09/28/19 Time [place]: 59 Representative spoke with at [place]: [place] ([place]) Interventions    Readmission Risk Interventions No flowsheet data found.

## 2019-10-03 NOTE — Care Management Important Message (Signed)
Important Message  Patient Details  Name: Tina Hayes MRN: [id number] Date of Birth: 12-Aug-1945   Medicare Important Message Given:  Yes     Tina Hayes 10/03/2019, 10:30 AM

## 2019-10-03 NOTE — Progress Notes (Signed)
  Subjective: 6 Days Post-Op Procedure(s) (LRB): TOTAL KNEE ARTHROPLASTY (Left) Patient reports pain as moderate.   Patient is well, being treated for UTI with Bactrim.  Started on Ketoconazole for yeast infection. Plan is to go Skilled nursing facility after hospital stay. Negative for chest pain and shortness of breath Fever: no Gastrointestinal: positive for nausea, no vomiting  Patient has had a bowel movement.  Objective: Vital signs in last 24 hours: Temp:  [98 F (36.7 C)-99.2 F (37.3 C)] 98 F (36.7 C) (06/07 0736) Pulse Rate:  [59-70] 70 (06/07 0736) Resp:  [16-19] 16 (06/07 0736) BP: (113-145)/(56-66) 145/66 (06/07 0736) SpO2:  [95 %-100 %] 100 % (06/07 0736)  Intake/Output from previous day:  Intake/Output Summary (Last 24 hours) at 10/03/2019 0740 Last data filed at 10/02/2019 2200 Gross per 24 hour  Intake 120 ml  Output --  Net 120 ml    Intake/Output this shift: No intake/output data recorded.  Labs: No results for input(s): HGB in the last 72 hours. No results for input(s): WBC, RBC, HCT, PLT in the last 72 hours. No results for input(s): NA, K, CL, CO2, BUN, CREATININE, GLUCOSE, CALCIUM in the last 72 hours. No results for input(s): LABPT, INR in the last 72 hours.   EXAM General - Patient is Alert, Appropriate and Oriented Extremity - Neurovascular intact Sensation intact distally Intact pulses distally Dorsiflexion/Plantar flexion intact Compartment soft Dressing/Incision -mild sanguinous drainage noted. Motor Function - intact, moving foot and toes well on exam.  Cardiovascular- Regular rate and rhythm, no murmurs/rubs/gallops Respiratory- Lungs clear to auscultation bilaterally Gastrointestinal- soft and nontender  Negative Homan's test to bilateral lower extremities.   Assessment/Plan: 6 Days Post-Op Procedure(s) (LRB): TOTAL KNEE ARTHROPLASTY (Left) Active Problems:   Status post total knee replacement using cement, left  Estimated  body mass index is 40.71 kg/m as calculated from the following:   Height as of this encounter: 5\' 11"  (1.803 m).   Weight as of this encounter: 132.4 kg. Up with therapy   Patient has had a BM. Moderate pain, continue with pain medication. Continue Batrim for 3 additional days, Ketoconazole cream to affect areas for candidiasis. Plan for discharge to SNF today.   DVT Prophylaxis - Lovenox and Ted hose Weight-Bearing as tolerated to left leg  J. Cameron Proud, PA-C [facility] Orthopaedic Surgery 10/03/2019, 7:40 AM

## 2019-10-03 NOTE — Progress Notes (Signed)
PT Cancellation Note  Patient Details Name: Tina Hayes MRN: [id number] DOB: May 30, 1945   Cancelled Treatment:    Reason Eval/Treat Not Completed: Pain limiting ability to participate;Other (comment)   Pt declined this am x 2 due to pain.  Offered and encouraged this pm but she stated she was leaving and awaiting transport to SNF.  Declined interventions at this time stating she has been OOB several times with nursing today.    Chesley Noon 10/03/2019, 4:46 PM

## 2019-10-04 LAB — GLUCOSE, CAPILLARY
Glucose-Capillary: 191 mg/dL — ABNORMAL HIGH (ref 70–99)
Glucose-Capillary: 244 mg/dL — ABNORMAL HIGH (ref 70–99)

## 2019-10-04 LAB — SARS CORONAVIRUS 2 BY RT PCR (HOSPITAL ORDER, PERFORMED IN ~~LOC~~ HOSPITAL LAB): SARS Coronavirus 2: NEGATIVE

## 2019-10-04 NOTE — Progress Notes (Signed)
Physical Therapy Treatment Patient Details Name: Tina Hayes MRN: [id number] DOB: 1945-06-06 Today's Date: 10/04/2019    History of Present Illness Pt admitted for L TKR. 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    PT Comments    Pt was in semi-fowler position upon arriving.  Therapist educated pt on importance of keeping knee extended and not have knee section of bed elevated. She states understanding. She was unwilling to get OOB this session 2/2 to c/o pain.9/10. Max encouragement for OOB activity however pt still unwilling. She did perform HEP exercises in bed with minimal assistance. Therapist will return later this date to progress pt as able per pt tolerance. She will benefit from skilled PT at SNF to address deficits with strength, ROM, and safe functional mobility. She was in bed at conclusion of session with call bell in reach, bed alarm set, towel roll under L heel to promote ext, and polar care in place. Acute PT will continue to follow per  POC.    Follow Up Recommendations  SNF     Equipment Recommendations  3in1 (PT);Wheelchair (measurements PT);Rolling walker with 5" wheels    Recommendations for Other Services       Precautions / Restrictions Precautions Precautions: Knee;Fall Precaution Booklet Issued: Yes (comment) Precaution Comments: KI on for mobility Required Braces or Orthoses: Knee Immobilizer - Left Knee Immobilizer - Left: On when out of bed or walking Restrictions Weight Bearing Restrictions: Yes LLE Weight Bearing: Weight bearing as tolerated    Mobility  Bed Mobility               General bed mobility comments: pt unwilling to get OOB even when encouraged to do so. " it hurts to bad to get up right now."  Transfers                    Ambulation/Gait                 Stairs             Wheelchair Mobility    Modified Rankin (Stroke Patients Only)       Balance                                             Cognition Arousal/Alertness: Awake/alert Behavior During Therapy: WFL for tasks assessed/performed Overall Cognitive Status: Within Functional Limits for tasks assessed                                 General Comments: Pt is alert and oriented. Needed max encouragement to participate in PT session. unwilling to get OOB even withmax encoragement. does agree to performing ther ex in bed.      Exercises Total Joint Exercises Ankle Circles/Pumps: AROM;10 reps;Both Quad Sets: AROM;Left;10 reps Gluteal Sets: AROM;Left;10 reps Towel Squeeze: AROM;Left;10 reps Short Arc Quad: AROM;10 reps Heel Slides: AROM;AAROM;5 reps(assisted into more flexion ) Hip ABduction/ADduction: AROM;10 reps Straight Leg Raises: AROM;10 reps;Left    General Comments        Pertinent Vitals/Pain Pain Assessment: 0-10 Pain Score: 9  Faces Pain Scale: Hurts even more Pain Location: L knee Pain Descriptors / Indicators: Operative site guarding;Sore;Aching Pain Intervention(s): Limited activity within patient's tolerance;Monitored during session;Premedicated before session;Repositioned;Ice applied    Home  Living                      Prior Function            PT Goals (current goals can now be found in the care plan section) Acute Rehab PT Goals Patient Stated Goal: to go home Progress towards PT goals: Not progressing toward goals - comment(pt unwillingness to get participate 2/2 to pain)    Frequency    BID      PT Plan Current plan remains appropriate    Co-evaluation     PT goals addressed during session: Strengthening/ROM        AM-PAC PT "6 Clicks" Mobility   Outcome Measure  Help needed turning from your back to your side while in a flat bed without using bedrails?: A Little Help needed moving from lying on your back to sitting on the side of a flat bed without using bedrails?: A Little Help needed moving to and from a bed  to a chair (including a wheelchair)?: A Little Help needed standing up from a chair using your arms (e.g., wheelchair or bedside chair)?: A Little Help needed to walk in hospital room?: A Little Help needed climbing 3-5 steps with a railing? : Total 6 Click Score: 16    End of Session Equipment Utilized During Treatment: Gait belt Activity Tolerance: Patient tolerated treatment well Patient left: in bed;with call bell/phone within reach;with bed alarm set;with nursing/sitter in room;with SCD's reapplied Nurse Communication: Mobility status PT Visit Diagnosis: Unsteadiness on feet (R26.81);Muscle weakness (generalized) (M62.81);Difficulty in walking, not elsewhere classified (R26.2);Pain Pain - Right/Left: Left Pain - part of body: Knee     Time: 0488-8916 PT Time Calculation (min) (ACUTE ONLY): 14 min  Charges:  $Therapeutic Exercise: 8-22 mins                     Julaine Fusi PTA 10/04/19, 12:11 PM

## 2019-10-04 NOTE — Progress Notes (Signed)
Inpatient Diabetes Program Recommendations  AACE/ADA: New Consensus Statement on Inpatient Glycemic Control   Target Ranges:  Prepandial:   less than 140 mg/dL      Peak postprandial:   less than 180 mg/dL (1-2 hours)      Critically ill patients:  140 - 180 mg/dL   Results for Tina Hayes, Tina Hayes (MRN [id number]) as of 10/04/2019 10:11  Ref. Range 10/03/2019 07:34 10/03/2019 11:41 10/03/2019 16:04 10/03/2019 21:32 10/04/2019 07:57  Glucose-Capillary Latest Ref Range: 70 - 99 mg/dL 186 (H) 229 (H) 169 (H) 169 (H) 191 (H)   Review of Glycemic Control  Diabetes history: DM2 Outpatient Diabetes medications: Lantus 30 units QAM, Lantus 40 units QPM, Novolog 10 units TID with meals, Jardiance 10 mg daily Current orders for Inpatient glycemic control: Lantus 15 units QAM, Lantus 20 units QHS, Novolog 0-15 units TID with meals  Inpatient Diabetes Program Recommendations:    Insulin-Correction: Please consider ordering Novolog 0-5 units QHS for bedtime correction.  Insulin-Meal Coverage: Please consider ordering Novolog 3 units TID with meals for meal coverage if patient eats at least 50% of meals.  Thanks, Barnie Alderman, RN, MSN, CDE Diabetes Coordinator Inpatient Diabetes Program [phone] (Team Pager from 8am to 5pm)

## 2019-10-04 NOTE — TOC Transition Note (Signed)
Transition of Care [facility]) - CM/SW Discharge Note   Patient Details  Name: Tina Hayes MRN: [id number] Date of Birth: Apr 02, 1946  Transition of Care [facility]) CM/SW Contact:  Su Hilt, RN Phone Number: 10/04/2019, 12:26 PM   Clinical Narrative:    Damaris Schooner to Beverely Low the patient's son and notified him that the patient will DC to [facility] today and the DC packet was placed in the chart, the Bedside nurse has everything ready and I called EMS the patient is 3rd on the list     Barriers to Discharge: Continued Medical Work up   Patient Goals and CMS Choice Patient states their goals for this hospitalization and ongoing recovery are:: go home      Discharge Placement                       Discharge Plan and Services   Discharge Planning Services: CM Consult            DME Arranged: 3-N-1, [place] rolling DME Agency: [employer] Date DME Agency Contacted: 09/28/19 Time DME Agency Contacted: [phone] Representative spoke with at DME Agency: [place]: PT [place]: [facility] [employer] Date [place]: 09/28/19 Time [place]: 57 Representative spoke with at [place]: [place] ([place]) Interventions     Readmission Risk Interventions No flowsheet data found.

## 2019-10-04 NOTE — Progress Notes (Signed)
   10/04/19 1300  Clinical Encounter Type  Visited With Patient  Visit Type Follow-up;Spiritual support  Referral From Chaplain  Consult/Referral To Chaplain  Chaplain stopped by to see patient and was informed that she is going to rehab today. Chaplain prayed with patient and told her to take care. Patient told chaplain "you are my angel and I couldn't have done it without you. I will do what you told me." Chaplain has been encouraging patient to stay encouraged and that she will be home soon. Chaplain will follow up with patient in a few days.

## 2019-10-04 NOTE — TOC Progression Note (Signed)
Transition of Care [facility]) - Progression Note    Patient Details  Name: Tina Hayes MRN: [id number] Date of Birth: Sep 30, 1945  Transition of Care [facility]) CM/SW Contact  Su Hilt, RN Phone Number: 10/04/2019, 11:05 AM  Clinical Narrative:    Spoke with the patient and confirmed that she has not had the covid vaccines   Expected Discharge Plan: [place] Barriers to Discharge: Continued Medical Work up  Expected Discharge Plan and Services Expected Discharge Plan: [place]   Discharge Planning Services: CM Consult   Living arrangements for the past 2 months: Single Family Home Expected Discharge Date: 10/04/19               DME Arranged: Berta Minor rolling DME Agency: [employer] Date DME Agency Contacted: 09/28/19 Time DME Agency Contacted: [phone] Representative spoke with at DME Agency: [place]: PT [place]: [facility] [employer] Date [place]: 09/28/19 Time [place]: 93 Representative spoke with at [place]: [place] ([place]) Interventions    Readmission Risk Interventions No flowsheet data found.

## 2019-10-04 NOTE — Progress Notes (Signed)
  Subjective: 7 Days Post-Op Procedure(s) (LRB): TOTAL KNEE ARTHROPLASTY (Left) Patient reports pain as mild.   Patient is well, being treated for UTI with Bactrim.  Started on Ketoconazole for yeast infection. Patient is frustrated that she is still admitted.  Hopefully with have insurance auth today for discharge to rehab. Plan is to go Skilled nursing facility after hospital stay. Negative for chest pain and shortness of breath Fever: no Gastrointestinal: positive for nausea, no vomiting  Patient has had a bowel movement.  Objective: Vital signs in last 24 hours: Temp:  [97.9 F (36.6 C)-98 F (36.7 C)] 97.9 F (36.6 C) (06/07 2217) Pulse Rate:  [66-70] 66 (06/07 2217) Resp:  [15-16] 15 (06/07 2217) BP: (145)/(65-66) 145/65 (06/07 2217) SpO2:  [96 %-100 %] 96 % (06/07 2217)  Intake/Output from previous day:  Intake/Output Summary (Last 24 hours) at 10/04/2019 0730 Last data filed at 10/03/2019 2200 Gross per 24 hour  Intake 720 ml  Output --  Net 720 ml    Intake/Output this shift: No intake/output data recorded.  Labs: No results for input(s): HGB in the last 72 hours. No results for input(s): WBC, RBC, HCT, PLT in the last 72 hours. No results for input(s): NA, K, CL, CO2, BUN, CREATININE, GLUCOSE, CALCIUM in the last 72 hours. No results for input(s): LABPT, INR in the last 72 hours.   EXAM General - Patient is Alert, Appropriate and Oriented Extremity - Neurovascular intact Sensation intact distally Intact pulses distally Dorsiflexion/Plantar flexion intact Compartment soft Dressing/Incision -mild sanguinous drainage noted. Motor Function - intact, moving foot and toes well on exam.  Cardiovascular- Regular rate and rhythm, no murmurs/rubs/gallops Respiratory- Lungs clear to auscultation bilaterally Gastrointestinal- soft and nontender  Negative Homan's test to bilateral lower extremities.   Assessment/Plan: 7 Days Post-Op Procedure(s) (LRB): TOTAL KNEE  ARTHROPLASTY (Left) Active Problems:   Status post total knee replacement using cement, left  Estimated body mass index is 40.71 kg/m as calculated from the following:   Height as of this encounter: 5\' 11"  (1.803 m).   Weight as of this encounter: 132.4 kg. Up with therapy   Patient has had a BM. Pain improved this AM. Continue Batrim for 3 additional days, Ketoconazole cream to affect areas for candidiasis. Plan for discharge to SNF today if insurance auth comes thru.   DVT Prophylaxis - Lovenox and Ted hose Weight-Bearing as tolerated to left leg  J. Cameron Proud, PA-C [facility] 10/04/2019, 7:30 AM

## 2019-10-04 NOTE — Progress Notes (Signed)
Report called and given to [place] at [employer]. Vitals stable. Patient ready for EMS.

## 2019-10-22 IMAGING — CR DG CHEST 2V
1 series · 2 of 2 positions shown · non-contrast
Comparison: 03/16/2017

CLINICAL DATA: Short of breath

EXAM:
CHEST - 2 VIEW

[Series 1: dg chest 2 view · 0.14mm/px · 2 of 2 slices shown]
[im 1/2]
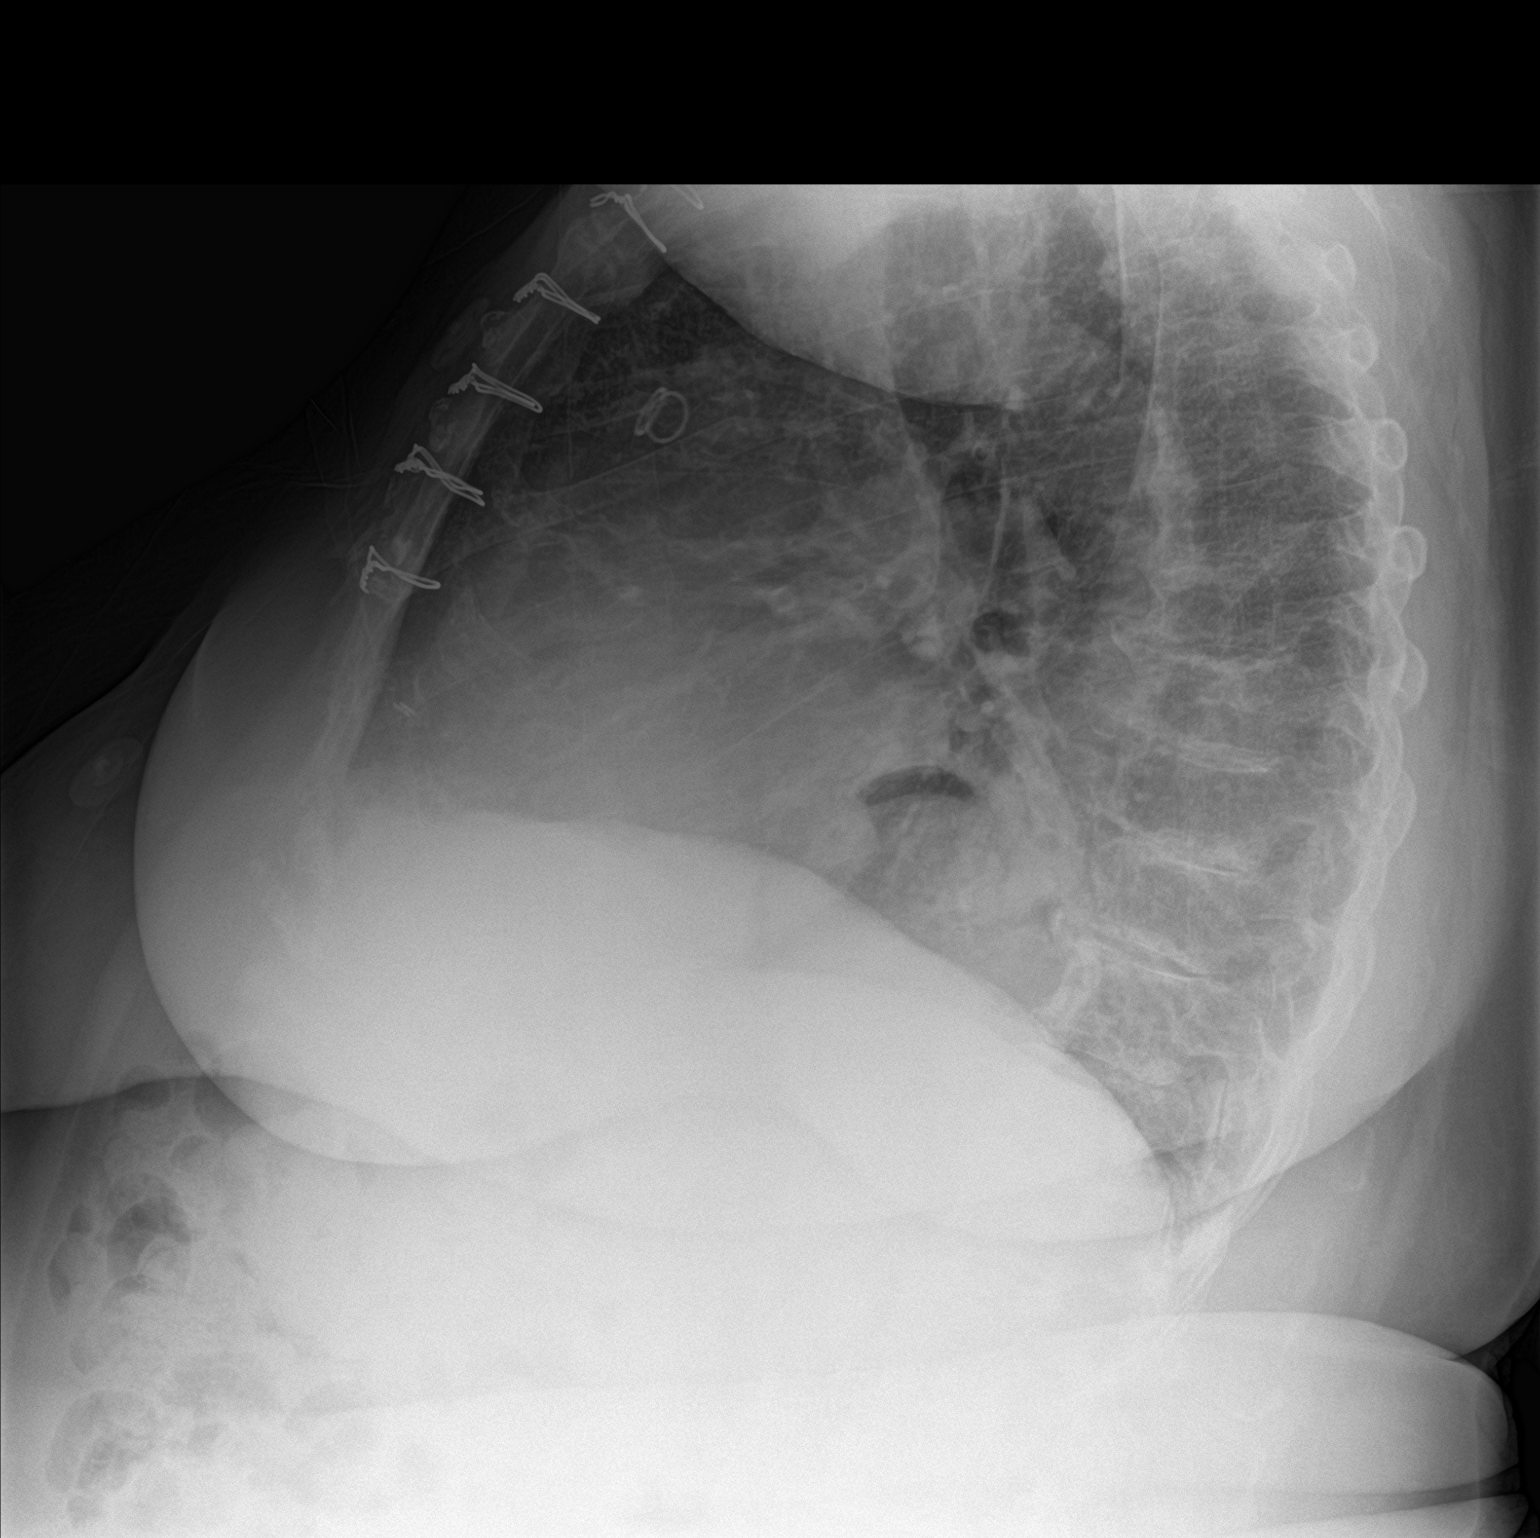
[im 2/2]
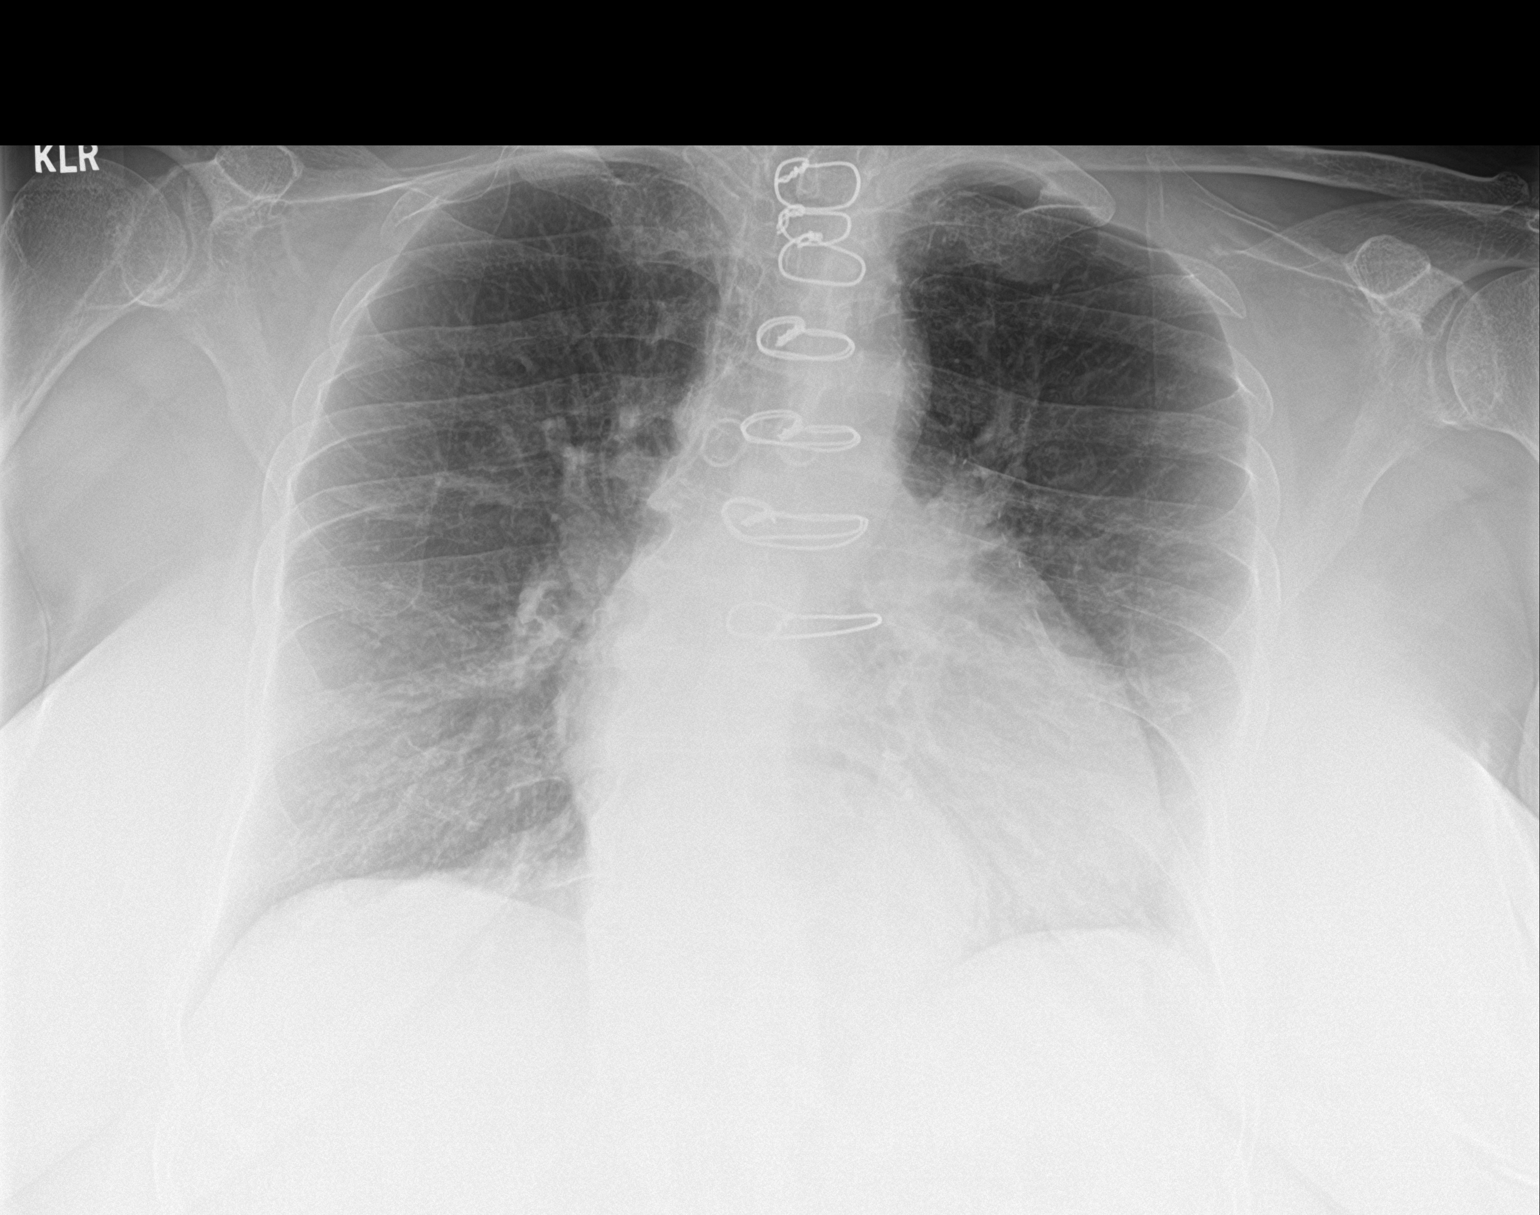

[2 of 2 positions shown; findings below may reference images not displayed]

FINDINGS: Post sternotomy changes. Mild cardiomegaly with vascular congestion.
Mild diffuse interstitial opacity likely reflects minimal edema.
Moderate hiatal hernia. No pneumothorax.
IMPRESSION: 1. Mild cardiomegaly with vascular congestion and minimal
interstitial edema
2. Moderate hiatal hernia

## 2019-10-24 ENCOUNTER — Ambulatory Visit: Payer: Medicare HMO | Attending: Anesthesiology | Admitting: Anesthesiology

## 2019-10-24 ENCOUNTER — Other Ambulatory Visit: Payer: Self-pay

## 2019-10-24 DIAGNOSIS — G894 Chronic pain syndrome: Secondary | ICD-10-CM | POA: Diagnosis not present

## 2019-10-24 DIAGNOSIS — M48062 Spinal stenosis, lumbar region with neurogenic claudication: Secondary | ICD-10-CM

## 2019-10-24 DIAGNOSIS — M5432 Sciatica, left side: Secondary | ICD-10-CM

## 2019-10-24 DIAGNOSIS — M47817 Spondylosis without myelopathy or radiculopathy, lumbosacral region: Secondary | ICD-10-CM

## 2019-10-24 DIAGNOSIS — M4716 Other spondylosis with myelopathy, lumbar region: Secondary | ICD-10-CM

## 2019-10-24 DIAGNOSIS — M51369 Other intervertebral disc degeneration, lumbar region without mention of lumbar back pain or lower extremity pain: Secondary | ICD-10-CM

## 2019-10-24 DIAGNOSIS — M5136 Other intervertebral disc degeneration, lumbar region: Secondary | ICD-10-CM

## 2019-10-24 DIAGNOSIS — M25512 Pain in left shoulder: Secondary | ICD-10-CM

## 2019-10-24 DIAGNOSIS — M5431 Sciatica, right side: Secondary | ICD-10-CM | POA: Diagnosis not present

## 2019-10-24 DIAGNOSIS — F1190 Opioid use, unspecified, uncomplicated: Secondary | ICD-10-CM

## 2019-10-26 ENCOUNTER — Other Ambulatory Visit (INDEPENDENT_AMBULATORY_CARE_PROVIDER_SITE_OTHER): Payer: Self-pay | Admitting: Vascular Surgery

## 2019-10-26 DIAGNOSIS — G450 Vertebro-basilar artery syndrome: Secondary | ICD-10-CM

## 2019-10-27 ENCOUNTER — Telehealth: Payer: Self-pay

## 2019-10-27 MED ORDER — OXYCODONE HCL 5 MG PO TABS: 5.0000 mg | ORAL_TABLET | Freq: Four times a day (QID) | ORAL | 0 refills | Status: DC | PRN

## 2019-10-27 MED ORDER — OXYCODONE HCL 5 MG PO TABS: 5.0000 mg | ORAL_TABLET | Freq: Three times a day (TID) | ORAL | 0 refills | Status: DC

## 2019-10-27 NOTE — Telephone Encounter (Signed)
She said Dr. Andree Elk was supposed to call her 2 days ago and didn't. She wants to know if he is going to call her medicines in.

## 2019-10-28 NOTE — Progress Notes (Signed)
There were no vitals taken for this visit. Virtual Visit via Telephone Note  I connected with Tina Hayes on 10/28/19 at  1:30 PM EDT by telephone and verified that I am speaking with the correct person using two identifiers.  Location: Patient: Home Provider: Pain control center   I discussed the limitations, risks, security and privacy concerns of performing an evaluation and management service by telephone and the availability of in person appointments. I also discussed with the patient that there may be a patient responsible charge related to this service. The patient expressed understanding and agreed to proceed.   History of Present Illness:   I spoke with [name] via telephone.  She was unable to do the video portion of the virtual conference.  She reports that her low back pain has been stable in nature.  She is taking her medications as prescribed and these are continue to work well for her.  Based on our discussion today she continues to derive functional improvement with her pain management and is sleeping better.  She is trying to stay active.  She has made efforts at weight loss with limited success.  The quality characteristic distribution of her low back pain is been stable in nature without any recent change.  No side effects from the medications are reported and she is tolerating them well. Observations/Objective:  Current Outpatient Medications:  .  albuterol (PROVENTIL HFA;VENTOLIN HFA) 108 (90 Base) MCG/ACT inhaler, Inhale 2 puffs into the lungs every 6 (six) hours as needed for wheezing or shortness of breath., Disp: 1 Inhaler, Rfl: 2 .  albuterol (PROVENTIL) (2.5 MG/3ML) 0.083% nebulizer solution, Take 2.5 mg every 4 (four) hours as needed by nebulization for wheezing or shortness of breath., Disp: , Rfl:  .  amLODipine (NORVASC) 10 MG tablet, Take 1 tablet by mouth once daily, Disp: 90 tablet, Rfl: 0 .  atorvastatin (LIPITOR) 40 MG tablet, TAKE 1 TABLET BY MOUTH AT  BEDTIME, Disp: 30 tablet, Rfl: 0 .  B-D UF III MINI PEN NEEDLES 31G X 5 MM MISC, USE WITH INSULIN PEN INJECTIONS TWICE DAILY, Disp: , Rfl:  .  budesonide (PULMICORT) 0.5 MG/2ML nebulizer solution, Take 0.5 mg by nebulization daily at 12 noon. , Disp: , Rfl:  .  carvedilol (COREG) 3.125 MG tablet, Take 1 tablet (3.125 mg total) by mouth 2 (two) times daily with a meal., Disp: 60 tablet, Rfl: 11 .  cetirizine (ZYRTEC) 10 MG tablet, Take 10 mg by mouth daily., Disp: , Rfl:  .  clopidogrel (PLAVIX) 75 MG tablet, Take 1 tablet (75 mg total) by mouth daily., Disp: 90 tablet, Rfl: 3 .  COMBIVENT RESPIMAT 20-100 MCG/ACT AERS respimat, Inhale 1 puff into the lungs 4 (four) times daily as needed for wheezing. , Disp: , Rfl:  .  Dupilumab (DUPIXENT) 300 MG/2ML SOPN, Inject 300 mg into the skin every 14 (fourteen) days. , Disp: , Rfl:  .  empagliflozin (JARDIANCE) 10 MG TABS tablet, Take 10 mg by mouth daily., Disp: , Rfl:  .  enoxaparin (LOVENOX) 40 MG/0.4ML injection, Inject 0.4 mLs (40 mg total) into the skin every 12 (twelve) hours., Disp: 11.2 mL, Rfl: 0 .  esomeprazole (NEXIUM) 40 MG capsule, Take 40 mg by mouth 2 (two) times daily., Disp: , Rfl:  .  ferrous sulfate 325 (65 FE) MG tablet, Take 325 mg by mouth daily., Disp: , Rfl:  .  isosorbide mononitrate (IMDUR) 30 MG 24 hr tablet, Take 1 tablet (30 mg total)  by mouth 2 (two) times daily., Disp: 60 tablet, Rfl: 11 .  ketoconazole (NIZORAL) 2 % cream, Apply topically 2 (two) times daily., Disp: 15 g, Rfl: 0 .  LANTUS SOLOSTAR 100 UNIT/ML Solostar Pen, Inject 30-40 Units into the skin See admin instructions. Inject 30 units subcutaneously in the morning at 40 units subcutaneously at bedtime., Disp: , Rfl:  .  levETIRAcetam (KEPPRA) 750 MG tablet, Take 750 mg by mouth 2 (two) times daily., Disp: , Rfl:  .  losartan (COZAAR) 100 MG tablet, Take 1 tablet (100 mg total) by mouth daily., Disp: 30 tablet, Rfl: 11 .  meloxicam (MOBIC) 7.5 MG tablet, Take 7.5 mg  by mouth daily as needed for pain. , Disp: , Rfl:  .  mometasone-formoterol (DULERA) 200-5 MCG/ACT AERO, Inhale 2 puffs into the lungs 2 (two) times daily., Disp: 13 g, Rfl: 0 .  montelukast (SINGULAIR) 10 MG tablet, Take 10 mg by mouth daily. , Disp: , Rfl:  .  nitroGLYCERIN (NITROSTAT) 0.4 MG SL tablet, DISSOLVE ONE TABLET UNDER THE TONGUE EVERY 5 MINUTES AS NEEDED FOR CHEST PAIN.  DO NOT EXCEED A TOTAL OF 3 DOSES IN 15 MINUTES (Patient taking differently: Place 0.4 mg under the tongue every 5 (five) minutes as needed for chest pain. ), Disp: 25 tablet, Rfl: 0 .  NOVOLOG FLEXPEN 100 UNIT/ML FlexPen, Inject 10 Units into the skin 3 (three) times daily with meals., Disp: , Rfl:  .  ondansetron (ZOFRAN) 4 MG tablet, Take 1 tablet (4 mg total) by mouth every 6 (six) hours as needed for nausea., Disp: 20 tablet, Rfl: 0 .  [START ON 11/12/2019] oxyCODONE (OXY IR/ROXICODONE) 5 MG immediate release tablet, Take 1 tablet (5 mg total) by mouth every 6 (six) hours as needed for moderate pain or severe pain., Disp: 90 tablet, Rfl: 0 .  [START ON 12/12/2019] oxyCODONE (ROXICODONE) 5 MG immediate release tablet, Take 1 tablet (5 mg total) by mouth 3 (three) times daily., Disp: 90 tablet, Rfl: 0 .  potassium chloride SA (KLOR-CON) 20 MEQ tablet, Take 2 tablets (40 mEq total) by mouth 2 (two) times daily. Take extra 2 tablets (40 meq) if you take metolazone, Disp: 140 tablet, Rfl: 5 .  rOPINIRole (REQUIP) 1 MG tablet, Take 1 mg by mouth at bedtime. , Disp: , Rfl:  .  sertraline (ZOLOFT) 100 MG tablet, Take 100 mg by mouth at bedtime., Disp: , Rfl:  .  sulfamethoxazole-trimethoprim (BACTRIM DS) 800-160 MG tablet, Take 1 tablet by mouth every 12 (twelve) hours., Disp: 6 tablet, Rfl: 0 .  tiotropium (SPIRIVA) 18 MCG inhalation capsule, Place 18 mcg into inhaler and inhale daily., Disp: , Rfl:  .  torsemide (DEMADEX) 20 MG tablet, Take 3 tablets (60 mg total) by mouth 2 (two) times daily., Disp: 540 tablet, Rfl: 3 .   traMADol (ULTRAM) 50 MG tablet, Take 1 tablet (50 mg total) by mouth every 6 (six) hours., Disp: 40 tablet, Rfl: 0 .  traZODone (DESYREL) 50 MG tablet, Take 50 mg by mouth at bedtime., Disp: , Rfl:   Assessment and Plan: 1. 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 Lumbar spondylosis with myelopathy   4. Chronic pain syndrome   5. Chronic, continuous use of opioids   6. DDD (degenerative disc disease), lumbar   7. Facet arthritis of lumbosacral region   8. Pain in joint of left shoulder   Based on our discussion today I am going to  refill her medications for the next 2 months.  I have her continue with her current regimen.  I have reviewed the [facility] practitioner database information and it is appropriate.  I want her to continue follow-up with her primary care physicians for baseline medical care with return to clinic in 2 months.  Follow Up Instructions:    I discussed the assessment and treatment plan with the patient. The patient was provided an opportunity to ask questions and all were answered. The patient agreed with the plan and demonstrated an understanding of the instructions.   The patient was advised to call back or seek an in-person evaluation if the symptoms worsen or if the condition fails to improve as anticipated.  I provided 30 minutes of non-face-to-face time during this encounter.   Molli Barrows, MD

## 2019-11-01 ENCOUNTER — Other Ambulatory Visit: Payer: Self-pay | Admitting: Cardiovascular Disease

## 2019-11-02 NOTE — Telephone Encounter (Signed)
Attempted to schedule.  LMOV to call office.  ° °

## 2019-11-02 NOTE — Telephone Encounter (Signed)
Pt overdue for 12 month f/u last seen 07/2018. Please contact pt for future appointment.

## 2019-11-10 NOTE — Telephone Encounter (Signed)
Unable to lvm

## 2019-11-17 ENCOUNTER — Other Ambulatory Visit: Payer: Self-pay

## 2019-11-17 ENCOUNTER — Ambulatory Visit: Payer: Medicare HMO | Attending: Anesthesiology | Admitting: Anesthesiology

## 2019-11-21 ENCOUNTER — Ambulatory Visit: Payer: Medicare HMO | Attending: Anesthesiology | Admitting: Anesthesiology

## 2019-11-21 ENCOUNTER — Telehealth: Payer: Self-pay | Admitting: *Deleted

## 2019-11-21 ENCOUNTER — Other Ambulatory Visit: Payer: Self-pay

## 2019-11-21 DIAGNOSIS — M48062 Spinal stenosis, lumbar region with neurogenic claudication: Secondary | ICD-10-CM | POA: Diagnosis not present

## 2019-11-21 DIAGNOSIS — M5136 Other intervertebral disc degeneration, lumbar region: Secondary | ICD-10-CM

## 2019-11-21 DIAGNOSIS — M5431 Sciatica, right side: Secondary | ICD-10-CM | POA: Diagnosis not present

## 2019-11-21 DIAGNOSIS — M4716 Other spondylosis with myelopathy, lumbar region: Secondary | ICD-10-CM | POA: Diagnosis not present

## 2019-11-21 DIAGNOSIS — G894 Chronic pain syndrome: Secondary | ICD-10-CM | POA: Diagnosis not present

## 2019-11-21 DIAGNOSIS — M5432 Sciatica, left side: Secondary | ICD-10-CM

## 2019-11-21 DIAGNOSIS — F1190 Opioid use, unspecified, uncomplicated: Secondary | ICD-10-CM

## 2019-11-21 DIAGNOSIS — M47817 Spondylosis without myelopathy or radiculopathy, lumbosacral region: Secondary | ICD-10-CM

## 2019-11-21 DIAGNOSIS — M25512 Pain in left shoulder: Secondary | ICD-10-CM

## 2019-11-21 MED ORDER — HYDROCODONE-ACETAMINOPHEN 7.5-325 MG PO TABS: 1.0000 | ORAL_TABLET | Freq: Three times a day (TID) | ORAL | 0 refills | Status: DC

## 2019-11-21 MED ORDER — HYDROCODONE-ACETAMINOPHEN 7.5-325 MG PO TABS: 1.0000 | ORAL_TABLET | Freq: Three times a day (TID) | ORAL | 0 refills | Status: AC

## 2019-11-21 NOTE — Telephone Encounter (Signed)
Add this virtual visit to today and notify patient please.

## 2019-11-24 ENCOUNTER — Telehealth: Payer: Medicare HMO | Admitting: Anesthesiology

## 2019-11-24 ENCOUNTER — Other Ambulatory Visit: Payer: Self-pay | Admitting: Cardiovascular Disease

## 2019-11-24 NOTE — Progress Notes (Signed)
Virtual Visit via Telephone Note  I connected with Tina Hayes on 11/24/19 at  3:00 PM EDT by telephone and verified that I am speaking with the correct person using two identifiers.  Location: Patient: Home Provider: [facility] control center   I discussed the limitations, risks, security and privacy concerns of performing an evaluation and management service by telephone and the availability of in person appointments. I also discussed with the patient that there may be a patient responsible charge related to this service. The patient expressed understanding and agreed to proceed.   History of Present Illness: I spoke with Tina Hayes today via telephone as she was unable to do the video portion of the virtual conference.  She reports that she is having some recurrence in her low back pain with leg pain.  In the past she has had epidurals for this pain and they have worked well.  Her last epidural was back in January and she reports approximately 75 to 80% relief generally lasting approximately 2 months before she gets gradual recurrence of a similar pain.  Unfortunately more conservative therapy has been insufficient in keeping her pain under control.  She continues to take her medications as prescribed and this regimen seems to work well for her and keep her pain under decent control.  She derives good improvement with the medications with no side effects reported.  She has been compliant with this regimen and unfortunately has failed more conservative medication management.  The quality characteristic and distribution of her low back pain or leg pain are otherwise stable in nature.    Observations/Objective:  Current Outpatient Medications:    albuterol (PROVENTIL HFA;VENTOLIN HFA) 108 (90 Base) MCG/ACT inhaler, Inhale 2 puffs into the lungs every 6 (six) hours as needed for wheezing or shortness of breath., Disp: 1 Inhaler, Rfl: 2   albuterol (PROVENTIL) (2.5 MG/3ML) 0.083% nebulizer solution,  Take 2.5 mg every 4 (four) hours as needed by nebulization for wheezing or shortness of breath., Disp: , Rfl:    amLODipine (NORVASC) 10 MG tablet, Take 1 tablet by mouth once daily, Disp: 90 tablet, Rfl: 0   atorvastatin (LIPITOR) 40 MG tablet, Take 1 tablet (40 mg total) by mouth at bedtime. Please call to schedule office visit for further refills. Thank you!, Disp: 15 tablet, Rfl: 0   B-D UF III MINI PEN NEEDLES 31G X 5 MM MISC, USE WITH INSULIN PEN INJECTIONS TWICE DAILY, Disp: , Rfl:    budesonide (PULMICORT) 0.5 MG/2ML nebulizer solution, Take 0.5 mg by nebulization daily at 12 noon. , Disp: , Rfl:    carvedilol (COREG) 3.125 MG tablet, Take 1 tablet (3.125 mg total) by mouth 2 (two) times daily with a meal., Disp: 60 tablet, Rfl: 11   cetirizine (ZYRTEC) 10 MG tablet, Take 10 mg by mouth daily., Disp: , Rfl:    clopidogrel (PLAVIX) 75 MG tablet, Take 1 tablet (75 mg total) by mouth daily., Disp: 90 tablet, Rfl: 3   COMBIVENT RESPIMAT 20-100 MCG/ACT AERS respimat, Inhale 1 puff into the lungs 4 (four) times daily as needed for wheezing. , Disp: , Rfl:    Dupilumab (DUPIXENT) 300 MG/2ML SOPN, Inject 300 mg into the skin every 14 (fourteen) days. , Disp: , Rfl:    empagliflozin (JARDIANCE) 10 MG TABS tablet, Take 10 mg by mouth daily., Disp: , Rfl:    enoxaparin (LOVENOX) 40 MG/0.4ML injection, Inject 0.4 mLs (40 mg total) into the skin every 12 (twelve) hours., Disp: 11.2 mL, Rfl:  0   esomeprazole (NEXIUM) 40 MG capsule, Take 40 mg by mouth 2 (two) times daily., Disp: , Rfl:    ferrous sulfate 325 (65 FE) MG tablet, Take 325 mg by mouth daily., Disp: , Rfl:    HYDROcodone-acetaminophen (NORCO) 7.5-325 MG tablet, Take 1 tablet by mouth 3 (three) times daily., Disp: 90 tablet, Rfl: 0   [START ON 12/21/2019] HYDROcodone-acetaminophen (NORCO) 7.5-325 MG tablet, Take 1 tablet by mouth 3 (three) times daily., Disp: 90 tablet, Rfl: 0   isosorbide mononitrate (IMDUR) 30 MG 24 hr tablet,  Take 1 tablet (30 mg total) by mouth 2 (two) times daily., Disp: 60 tablet, Rfl: 11   ketoconazole (NIZORAL) 2 % cream, Apply topically 2 (two) times daily., Disp: 15 g, Rfl: 0   LANTUS SOLOSTAR 100 UNIT/ML Solostar Pen, Inject 30-40 Units into the skin See admin instructions. Inject 30 units subcutaneously in the morning at 40 units subcutaneously at bedtime., Disp: , Rfl:    levETIRAcetam (KEPPRA) 750 MG tablet, Take 750 mg by mouth 2 (two) times daily., Disp: , Rfl:    losartan (COZAAR) 100 MG tablet, Take 1 tablet (100 mg total) by mouth daily., Disp: 30 tablet, Rfl: 11   meloxicam (MOBIC) 7.5 MG tablet, Take 7.5 mg by mouth daily as needed for pain. , Disp: , Rfl:    mometasone-formoterol (DULERA) 200-5 MCG/ACT AERO, Inhale 2 puffs into the lungs 2 (two) times daily., Disp: 13 g, Rfl: 0   montelukast (SINGULAIR) 10 MG tablet, Take 10 mg by mouth daily. , Disp: , Rfl:    nitroGLYCERIN (NITROSTAT) 0.4 MG SL tablet, DISSOLVE ONE TABLET UNDER THE TONGUE EVERY 5 MINUTES AS NEEDED FOR CHEST PAIN.  DO NOT EXCEED A TOTAL OF 3 DOSES IN 15 MINUTES (Patient taking differently: Place 0.4 mg under the tongue every 5 (five) minutes as needed for chest pain. ), Disp: 25 tablet, Rfl: 0   NOVOLOG FLEXPEN 100 UNIT/ML FlexPen, Inject 10 Units into the skin 3 (three) times daily with meals., Disp: , Rfl:    ondansetron (ZOFRAN) 4 MG tablet, Take 1 tablet (4 mg total) by mouth every 6 (six) hours as needed for nausea., Disp: 20 tablet, Rfl: 0   oxyCODONE (OXY IR/ROXICODONE) 5 MG immediate release tablet, Take 1 tablet (5 mg total) by mouth every 6 (six) hours as needed for moderate pain or severe pain., Disp: 90 tablet, Rfl: 0   [START ON 12/12/2019] oxyCODONE (ROXICODONE) 5 MG immediate release tablet, Take 1 tablet (5 mg total) by mouth 3 (three) times daily., Disp: 90 tablet, Rfl: 0   potassium chloride SA (KLOR-CON) 20 MEQ tablet, Take 2 tablets (40 mEq total) by mouth 2 (two) times daily. Take extra 2  tablets (40 meq) if you take metolazone, Disp: 140 tablet, Rfl: 5   rOPINIRole (REQUIP) 1 MG tablet, Take 1 mg by mouth at bedtime. , Disp: , Rfl:    sertraline (ZOLOFT) 100 MG tablet, Take 100 mg by mouth at bedtime., Disp: , Rfl:    sulfamethoxazole-trimethoprim (BACTRIM DS) 800-160 MG tablet, Take 1 tablet by mouth every 12 (twelve) hours., Disp: 6 tablet, Rfl: 0   tiotropium (SPIRIVA) 18 MCG inhalation capsule, Place 18 mcg into inhaler and inhale daily., Disp: , Rfl:    torsemide (DEMADEX) 20 MG tablet, Take 3 tablets (60 mg total) by mouth 2 (two) times daily., Disp: 540 tablet, Rfl: 3   traMADol (ULTRAM) 50 MG tablet, Take 1 tablet (50 mg total) by mouth every 6 (six) hours.,  Disp: 40 tablet, Rfl: 0   traZODone (DESYREL) 50 MG tablet, Take 50 mg by mouth at bedtime., Disp: , Rfl:   Assessment and Plan: 1. 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 Lumbar spondylosis with myelopathy   4. Chronic pain syndrome   5. Chronic, continuous use of opioids   6. DDD (degenerative disc disease), lumbar   7. Facet arthritis of lumbosacral region   8. Pain in joint of left shoulder   Based on our discussion today and upon review of the [facility] practitioner database information going to refill her medications for the next 2 months dated for July and August.  I will schedule her for return to clinic in approximately 2 months for a repeat epidural steroid injection.  The risks and benefits of this have been reviewed with her in full detail and all questions answered.  I want her to continue efforts at weight loss stretching strengthening as previously discussed.  Continue follow-up with her primary care physicians for her baseline medical care as well.  Follow Up Instructions:    I discussed the assessment and treatment plan with the patient. The patient was provided an opportunity to ask questions and all were answered. The patient agreed  with the plan and demonstrated an understanding of the instructions.   The patient was advised to call back or seek an in-person evaluation if the symptoms worsen or if the condition fails to improve as anticipated.  I provided 30 minutes of non-face-to-face time during this encounter.   Molli Barrows, MD

## 2019-11-25 ENCOUNTER — Other Ambulatory Visit: Payer: Self-pay

## 2019-11-25 ENCOUNTER — Encounter: Payer: Self-pay | Admitting: Physician Assistant

## 2019-11-25 ENCOUNTER — Ambulatory Visit (INDEPENDENT_AMBULATORY_CARE_PROVIDER_SITE_OTHER): Payer: Medicare HMO | Admitting: Physician Assistant

## 2019-11-25 VITALS — BP 110/78 | HR 60 | Ht 71.0 in | Wt 266.5 lb

## 2019-11-25 DIAGNOSIS — R42 Dizziness and giddiness: Secondary | ICD-10-CM

## 2019-11-25 DIAGNOSIS — Z794 Long term (current) use of insulin: Secondary | ICD-10-CM

## 2019-11-25 DIAGNOSIS — I5032 Chronic diastolic (congestive) heart failure: Secondary | ICD-10-CM

## 2019-11-25 DIAGNOSIS — E119 Type 2 diabetes mellitus without complications: Secondary | ICD-10-CM | POA: Diagnosis not present

## 2019-11-25 DIAGNOSIS — Z951 Presence of aortocoronary bypass graft: Secondary | ICD-10-CM

## 2019-11-25 DIAGNOSIS — I25118 Atherosclerotic heart disease of native coronary artery with other forms of angina pectoris: Secondary | ICD-10-CM | POA: Diagnosis not present

## 2019-11-25 DIAGNOSIS — R1310 Dysphagia, unspecified: Secondary | ICD-10-CM

## 2019-11-25 DIAGNOSIS — I10 Essential (primary) hypertension: Secondary | ICD-10-CM | POA: Diagnosis not present

## 2019-11-25 DIAGNOSIS — G4733 Obstructive sleep apnea (adult) (pediatric): Secondary | ICD-10-CM

## 2019-11-25 MED ORDER — AMLODIPINE BESYLATE 5 MG PO TABS
5.0000 mg | ORAL_TABLET | Freq: Every day | ORAL | 6 refills | Status: DC
Start: 2019-11-25 — End: 2020-06-29

## 2019-11-25 NOTE — Progress Notes (Deleted)
, °

## 2019-11-25 NOTE — Patient Instructions (Signed)
Medication Instructions:  - Your physician has recommended you make the following change in your medication:   1) Decrease amlodipine to 5 mg- take 1 tablet by mouth once daily   *If you need a refill on your cardiac medications before your next appointment, please call your pharmacy*   Lab Work: - none ordered  If you have labs (blood work) drawn today and your tests are completely normal, you will receive your results only by: [name] MyChart Message (if you have MyChart) OR . A paper copy in the mail If you have any lab test that is abnormal or we need to change your treatment, we will call you to review the results.   Testing/Procedures: - none ordered   Follow-Up: At [facility], you and your health needs are our priority.  As part of our continuing mission to provide you with exceptional heart care, we have created designated Provider Care Teams.  These Care Teams include your primary Cardiologist (physician) and Advanced Practice Providers (APPs -  Physician Assistants and Nurse Practitioners) who all work together to provide you with the care you need, when you need it.  We recommend signing up for the patient portal called "MyChart".  Sign up information is provided on this After Visit Summary.  MyChart is used to connect with patients for Virtual Visits (Telemedicine).  Patients are able to view lab/test results, encounter notes, upcoming appointments, etc.  Non-urgent messages can be sent to your provider as well.   To learn more about what you can do with MyChart, go to [email or web address].    Your next appointment:   1 month(s)  The format for your next appointment:   In Person  Provider:    You may see Ida Rogue, MD or one of the following Advanced Practice Providers on your designated Care Team:    Murray Hodgkins, NP  Christell Faith, PA-C  Marrianne Mood, PA-C    Other Instructions - You have an appointment to follow up with [facility]  GI: Monday 01/16/20 @ 10:30 am Effie Berkshire, [place] [phone]

## 2019-11-25 NOTE — Progress Notes (Addendum)
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  Past Medical History    Past Medical History:  Diagnosis Date  . (HFpEF) heart failure with preserved ejection fraction ([place])    a. 05/2016 Echo: EF 60-65%, mild to mod LVH, Gr1 DD, mild MR, mildly dil LA, mod TR, mildly to mod increased PASP.  [name] Acute diastolic heart failure ([place]) 01/27/2017  . Anxiety   . Arthritis   . Chest pain 06/16/2016  . CHF (congestive heart failure) ([place])   . Chronic back pain   . Chronic diastolic congestive heart failure ([place]) 02/13/2017  . COPD (chronic obstructive pulmonary disease) ([place])   . Coronary artery disease    a. s/p remote PCI x 5;  b. 2006 s/p CABG x 3 ([place]); b. 05/2016 MV: attenuation corrected images w/o ischemia or wma-->Med rx.  . Coronary artery disease of native artery of native heart with stable angina pectoris ([place]) 06/17/2016  . Depression   . Diabetes mellitus without complication ([place])   . Essential hypertension 06/30/2016  . GERD (gastroesophageal reflux disease)   . Heart attack ([place])    Total of 3 per pt.  . Hypertension   . Hypertensive urgency 06/03/2015  . Seizure ([place])   . Seizures ([place])    Past Surgical History:  Procedure Laterality Date  . ABDOMINAL HYSTERECTOMY    . ANKLE SURGERY Right   . CATARACT EXTRACTION W/ INTRAOCULAR LENS  IMPLANT, BILATERAL    . COLONOSCOPY WITH PROPOFOL N/A 05/04/2017   Procedure: COLONOSCOPY WITH PROPOFOL;  Surgeon: [name],  MD;  Location: [facility]COPY;  Service: Endoscopy;  Laterality: N/A;  . CORONARY ANGIOPLASTY    . CORONARY ARTERY BYPASS GRAFT     TRIPLE BYPASS  . ESOPHAGOGASTRODUODENOSCOPY (EGD) WITH PROPOFOL N/A 05/04/2017   Procedure: ESOPHAGOGASTRODUODENOSCOPY (EGD) WITH PROPOFOL;  Surgeon: [name], MD;  Location: [facility] ENDOSCOPY;  Service: Endoscopy;  Laterality: N/A;  . EXCISION MASS LOWER EXTREMETIES Right 03/02/2018   Procedure: EXCISION SOFT TISSUE MASS FROM MEDIAL ASPECT OF RIGHT ANKLE;  Surgeon: [name], MD;  Location: [facility] ORS;  Service: Orthopedics;  Laterality: Right;  . FRACTURE SURGERY    . JOINT REPLACEMENT     right  . KNEE SURGERY Right   . LOWER EXTREMITY ANGIOGRAPHY Right 07/14/2019   Procedure: LOWER EXTREMITY ANGIOGRAPHY;  Surgeon: Algernon Huxley, MD;  Location: [facility];  Service: Cardiovascular;  Laterality: Right;  . MOUTH SURGERY Left 07/14/2017  . TOTAL KNEE ARTHROPLASTY Left 09/27/2019   Procedure: TOTAL KNEE ARTHROPLASTY;  Surgeon: [name], MD;  Location: [facility] ORS;  Service: Orthopedics;  Laterality: Left;    Allergies  Allergies  Allergen Reactions  . Aspirin Anaphylaxis    History of Present Illness    SOLEY HARRISS is a 74 y.o. female with PMH as above.  She has a complex PMH including CAD s/p PCI and CABG x3 in 2006, hypertension, hyperlipidemia, DM2, and HFpEF.  She was seen in the hospital  01/2017 for intermittent chest and left shoulder pain and tenderness for many months.  Since then, she has been followed regularly by our office.  She was last seen in the office 07/2018.  It was noted she was euvolemic and stable on all medications.  She had stopped smoking years ago. She recently underwent sleep study with CPAP recommended.    Since then, she has been medically managed by Darylene Price, [place].  She returns to clinic today and notes chest pain. She states that food gets caught in her throat. She has a known hiatal hernia. She underwent  04/2017 EGD with mild Schatzki ring (dilated) and mediium sized hiatal hernia. Gastritis was also noted in the antrum. In addition, she has sigmoid diverticulosis and internal hemorrhoids. IN 09/2017, she underwent Barium swallow with prebyesophagus and large hiatal hernia. It was also determined she had moderate GERD. Recommendation was to continue esomeprazole and add ranitidine 300mg  qhs. She saw GI 11/2018 with recommendation for small meals and bites with sips of water between and further intervention would be performed if no relief. She reports GI ongoing sx with a bad episode of nausea a couple of weeks ago. She also reports she is quite sedentary, due to recent July knee surgery. Uses cane normally when out and about. She is attempting to increase activity as tolerated. She has ongoing sciatica pain and follows with pain management. She is also following with vascular surgery.  She reports intermittent bradycardia and dizziness. Discussed was further workup with cardiac monitoring, labs, and carotid studies.  She denies pnd, orthopnea,  syncope, increased edema from baseline, weight gain attributed to volume, or early satiety. She reports DOE and racing HR due to deconditioning. She reports medication compliance. No s/sx of bleeding.  Home Medications    Prior to Admission medications   Medication Sig Start Date End Date Taking? Authorizing Provider  albuterol (PROVENTIL HFA;VENTOLIN HFA) 108 (90 Base) MCG/ACT inhaler Inhale 2 puffs into the lungs every 6 (six) hours as needed for wheezing or shortness of breath. 08/30/15   Epifanio Lesches, MD  albuterol (PROVENTIL) (2.5 MG/3ML) 0.083% nebulizer solution Take 2.5 mg every 4 (four) hours as needed by nebulization for wheezing or shortness of breath.    [provider]  amLODipine (NORVASC) 10 MG tablet Take 1 tablet by mouth once daily 08/31/19   Minna Merritts, MD  atorvastatin (LIPITOR) 40 MG tablet Take 1 tablet (40 mg total) by mouth  daily. 11/24/19   Minna Merritts, MD  B-D UF III MINI PEN NEEDLES 31G X 5 MM MISC USE WITH INSULIN PEN INJECTIONS TWICE DAILY 08/05/18   [provider]  budesonide (PULMICORT) 0.5 MG/2ML nebulizer solution Take 0.5 mg by nebulization daily at 12 noon.     [provider]  carvedilol (COREG) 3.125 MG tablet Take 1 tablet (3.125 mg total) by mouth 2 (two) times daily with a meal. 08/17/18   Gollan, Kathlene November, MD  cetirizine (ZYRTEC) 10 MG tablet Take 10 mg by mouth daily.    [provider]  clopidogrel (PLAVIX) 75 MG tablet Take 1 tablet (75 mg total) by mouth daily. 05/19/19   Darylene Price A, FNP  COMBIVENT RESPIMAT 20-100 MCG/ACT AERS respimat Inhale 1 puff into the lungs 4 (four) times daily as needed for wheezing.  07/30/18   [provider]  Dupilumab (DUPIXENT) 300 MG/2ML SOPN Inject 300 mg into the skin every 14 (fourteen) days.     [provider]  empagliflozin (JARDIANCE) 10  MG TABS tablet Take 10 mg by mouth daily.    [provider]  enoxaparin (LOVENOX) 40 MG/0.4ML injection Inject 0.4 mLs (40 mg total) into the skin every 12 (twelve) hours. 09/30/19   Lattie Corns, PA-C  esomeprazole (NEXIUM) 40 MG capsule Take 40 mg by mouth 2 (two) times daily. 06/20/19   [provider]  ferrous sulfate 325 (65 FE) MG tablet Take 325 mg by mouth daily.    [provider]  HYDROcodone-acetaminophen (NORCO) 7.5-325 MG tablet Take 1 tablet by mouth 3 (three) times daily. 11/21/19 12/21/19  Molli Barrows, MD  HYDROcodone-acetaminophen (NORCO) 7.5-325 MG tablet Take 1 tablet by mouth 3 (three) times daily. 12/21/19 01/20/20  Molli Barrows, MD  isosorbide mononitrate (IMDUR) 30 MG 24 hr tablet Take 1 tablet (30 mg total) by mouth 2 (two) times daily. 08/17/18   Minna Merritts, MD  ketoconazole (NIZORAL) 2 % cream Apply topically 2 (two) times daily. 10/03/19   Lattie Corns, PA-C  LANTUS SOLOSTAR 100 UNIT/ML Solostar Pen Inject  30-40 Units into the skin See admin instructions. Inject 30 units subcutaneously in the morning at 40 units subcutaneously at bedtime. 05/26/19   [provider]  levETIRAcetam (KEPPRA) 750 MG tablet Take 750 mg by mouth 2 (two) times daily.    [provider]  losartan (COZAAR) 100 MG tablet Take 1 tablet (100 mg total) by mouth daily. 08/17/18   Minna Merritts, MD  meloxicam (MOBIC) 7.5 MG tablet Take 7.5 mg by mouth daily as needed for pain.     [provider]  mometasone-formoterol (DULERA) 200-5 MCG/ACT AERO Inhale 2 puffs into the lungs 2 (two) times daily. 02/03/19   Vaughan Basta, MD  montelukast (SINGULAIR) 10 MG tablet Take 10 mg by mouth daily.  11/05/17   [provider]  nitroGLYCERIN (NITROSTAT) 0.4 MG SL tablet DISSOLVE ONE TABLET UNDER THE TONGUE EVERY 5 MINUTES AS NEEDED FOR CHEST PAIN.  DO NOT EXCEED A TOTAL OF 3 DOSES IN 15 MINUTES Patient taking differently: Place 0.4 mg under the tongue every 5 (five) minutes as needed for chest pain.  07/09/18   Gollan, Kathlene November, MD  NOVOLOG FLEXPEN 100 UNIT/ML FlexPen Inject 10 Units into the skin 3 (three) times daily with meals. 06/07/19   [provider]  ondansetron (ZOFRAN) 4 MG tablet Take 1 tablet (4 mg total) by mouth every 6 (six) hours as needed for nausea. 10/03/19   Lattie Corns, PA-C  oxyCODONE (OXY IR/ROXICODONE) 5 MG immediate release tablet Take 1 tablet (5 mg total) by mouth every 6 (six) hours as needed for moderate pain or severe pain. 11/12/19 12/12/19  Molli Barrows, MD  oxyCODONE (ROXICODONE) 5 MG immediate release tablet Take 1 tablet (5 mg total) by mouth 3 (three) times daily. 12/12/19 01/11/20  Molli Barrows, MD  potassium chloride SA (KLOR-CON) 20 MEQ tablet Take 2 tablets (40 mEq total) by mouth 2 (two) times daily. Take extra 2 tablets (40 meq) if you take metolazone 02/06/19   Darylene Price A, FNP  rOPINIRole (REQUIP) 1 MG tablet Take 1 mg by mouth at bedtime.      [provider]  sertraline (ZOLOFT) 100 MG tablet Take 100 mg by mouth at bedtime. 04/18/19   [provider]  sulfamethoxazole-trimethoprim (BACTRIM DS) 800-160 MG tablet Take 1 tablet by mouth every 12 (twelve) hours. 10/03/19   Lattie Corns, PA-C  tiotropium (SPIRIVA) 18 MCG inhalation capsule Place 18  mcg into inhaler and inhale daily.    [provider]  torsemide (DEMADEX) 20 MG tablet Take 3 tablets (60 mg total) by mouth 2 (two) times daily. 03/23/19   Alisa Graff, FNP  traMADol (ULTRAM) 50 MG tablet Take 1 tablet (50 mg total) by mouth every 6 (six) hours. 09/30/19   Lattie Corns, PA-C  traZODone (DESYREL) 50 MG tablet Take 50 mg by mouth at bedtime.    [provider]    Review of Systems    She denies pnd, orthopnea,  syncope, increased edema from baseline, weight gain attributed to volume, or early satiety. She reports racing HR, DOE due to deconditioning. She reports nausea a few weeks ago in the setting of GERD. She feels dizzy when bradycardic. She reports atypical CP, likely related to GERD.   All other systems reviewed and are otherwise negative except as noted above.  Physical Exam    VS:  BP 110/78   Pulse 60   Ht 5\' 11"  (1.803 m)   Wt (!) 266 lb 8 oz (120.9 kg)   BMI 37.17 kg/m  , BMI There is no height or weight on file to calculate BMI. GEN: Well nourished, well developed, in no acute distress. HEENT: normal. Neck: Supple, JVD difficult to assess due to body habitus, carotid bruits, or masses. Cardiac: RRR, 1/6 systolic murmurs. No, rubs, or gallops. No clubbing, cyanosis, pitting edema.  Radials/DP/PT 2+ and equal bilaterally.  Respiratory:  Respirations regular and unlabored, clear to auscultation bilaterally. GI: Soft, nontender, nondistended, BS + x 4. MS: no deformity or atrophy. Skin: warm and dry, no rash. Neuro:  Strength and sensation are intact. Psych: Normal affect.  Accessory Clinical Findings      ECG personally reviewed by me today - SB, 55bpm, T wave abnormality noted  no acute changes.  VITALS Reviewed today   Temp Readings from Last 3 Encounters:  10/04/19 98.2 F (36.8 C)  07/14/19 98.2 F (36.8 C) (Oral)  05/12/19 (!) 97.5 F (36.4 C)   BP Readings from Last 3 Encounters:  10/04/19 (!) 143/62  07/14/19 137/61  07/05/19 105/69   Pulse Readings from Last 3 Encounters:  10/04/19 67  07/14/19 (!) 52  07/05/19 85    Wt Readings from Last 3 Encounters:  09/30/19 291 lb 14.2 oz (132.4 kg)  08/22/19 291 lb (132 kg)  07/14/19 300 lb (136.1 kg)     LABS  reviewed today   Lab Results  Component Value Date   WBC 7.9 09/30/2019   HGB 8.9 (L) 09/30/2019   HCT 28.3 (L) 09/30/2019   MCV 85.2 09/30/2019   PLT 238 09/30/2019   Lab Results  Component Value Date   CREATININE 1.05 (H) 09/30/2019   BUN 14 09/30/2019   NA 143 09/30/2019   K 3.8 09/30/2019   CL 107 09/30/2019   CO2 28 09/30/2019   Lab Results  Component Value Date   ALT 13 07/22/2019   AST 17 07/22/2019   ALKPHOS 148 (H) 07/22/2019   BILITOT 0.6 07/22/2019   Lab Results  Component Value Date   CHOL 120 01/30/2017   HDL 47 01/30/2017   LDLCALC 58 01/30/2017   TRIG 73 01/30/2017   CHOLHDL 2.6 01/30/2017    Lab Results  Component Value Date   HGBA1C 6.5 (H) 01/18/2019   No results found for: TSH   STUDIES/PROCEDURES reviewed today   Echo 01/2019 1. Left ventricular ejection fraction, by visual estimation, is 60 to  65%. The left ventricle has normal function. Normal left ventricular size.  There is mildly increased left ventricular hypertrophy.  2. Left ventricular diastolic Doppler parameters are consistent with  impaired relaxation pattern of LV diastolic filling.  3. Global right ventricle has normal systolic function.The right  ventricular size is mildly enlarged. No increase in right ventricular wall  thickness.  4. Left atrial size was mildly dilated.  5. Moderate mitral  valve regurgitation.  6. The tricuspid valve is normal in structure. Tricuspid valve  regurgitation moderate.  7. Moderately elevated pulmonary artery systolic pressure.  8. The tricuspid regurgitant velocity is 2.83 m/s, and with an assumed  right atrial pressure of 15 mmHg, the estimated right ventricular systolic  pressure is moderately elevated at 47.0    MPI 2018  This is an intermediate risk study.  There is a moderate in size, mild in severity, reversible defect involving the mid anterolateral, apical anterior, and apical lateral segments concerning for ischemia.  There is a small in size, mild in severity, partially reversible defect involving the basal inferior and inferoseptal segments suggestive of infarct and periinfarct ischemia.  The left ventricular ejection fraction is mildly decreased (45%).  Assessment & Plan    Chronic HFpEF, Pulmonary HTN Moderate MR --Euvolemic and well compensated on exam. Previous echo above. Continue to monitor volume status. Daily home weights. Low salt. Fluid restriction under 2L. With ongoing sx, recommend she consider repeat echo given her moderate MR in previous echo, which we discussed today. Will defer at this time. No medication changes. Continue torsemide and potassium supplementation. Continue losartan. Continue Coreg with preference to increase back to twice daily dosing at RTC (unclear why taking once per day). Continue to follow with the HF clinic.  Dizziness 2/2 self reported bradycardia / hypotension --Reports recent dizziness s/p knee surgery and in the setting of recent pain killers. BP and HR low today, which confirms pt report. Trial reduced amlodipine as recovers from knee surgery. If BP increases once weaned from painkillers, may need to increase the dose back up to amlodipine 10mg ; however, for now, her BP and HR have been on the softer side and she is symptomatic. Preference to decrease amlodipine over that of Coreg, given  she is already on low dose Coreg  3.125mg  and only once daily - would prefer to keep a BB on board per GDMT. Ideally, would increase Coreg back to BID frequency at RTC. Could decrease Losartan if further room in BP needed at RTC. Will check BMET. Consider carotids or monitor at RTC.  HTN --BP well controlled to soft on exam. Symptomatic with pt reporting dizziness. Will decrease amlodipine as above to 5mg  and reassess at RTC to see if need to return back to amlodipine 10mg  as wean from painkillers and recover from surgery. Recommend increase to Coreg BID frequency if possible at RTC. If additional BP room needed, consider holding amlodipine +/- decreasing Losartan.   CAD s/p CABG --Reports very atypical CP, likely 2/2 GERD given the above HPI. Associated sx include inability to eat solid food and occasional nausea. Recommended pt follow-up with GI as soon as able given atypical CP described as above. If ongoing sx, could consider further ischemic workup at that time. After discussion with pt, will defer for now. As above, consider increased dose Imdur at RTC. Continue amlodipine (reduced dose) and Coreg (reportedly only takes this once per day of note). Recommend increase Coreg if room in pressure/HR at RTC and if additional BP room needed,  hold amlodipine or Losartan for more BP room. Continue Plavix. Not on ASA due to allergy.  DM2 --Poorly controlled in past labs. Glycemic control recommended. Continue Losartan and consider reduced dose at RTC if hypotension continues.  HLD --Continue statin. Goal LDL <70. Previous LDL at goal. Management per PCP.   Emphysema --Stable breathing. Escalate activity as tolerated. Continue inhalers.   OSA --Underwent sleep study. CPAP recommended.   GERD --Attempted to provide GI referral today. No reported s/sx of bleeding today. Pt informed she may need to contact her PCP. Consider addition of Imdur at RTC if BP allows given long acting nitrate will also  alleviate any esophageal discomfort / spasm.   Medication changes: Decrease to amlodipine 5mg . BID dosing of Coreg recommended. Can cut Losartan or hold amlodipine at RTC if ongoing hypotension.  Labs ordered: BMET Studies / Imaging ordered: None Future considerations: Further ischemic workup if ongoing sx. Carotids. Cardiac Monitor.  Disposition: RTC 1 month or sooner if needed.   Arvil Chaco, PA-C 11/25/2019

## 2019-11-30 ENCOUNTER — Other Ambulatory Visit: Payer: Self-pay | Admitting: *Deleted

## 2019-11-30 MED ORDER — ISOSORBIDE MONONITRATE ER 30 MG PO TB24: 30.0000 mg | ORAL_TABLET | Freq: Two times a day (BID) | ORAL | 3 refills | Status: DC

## 2019-12-13 ENCOUNTER — Other Ambulatory Visit: Payer: Self-pay | Admitting: Family

## 2019-12-13 MED ORDER — POTASSIUM CHLORIDE CRYS ER 20 MEQ PO TBCR: 40.0000 meq | EXTENDED_RELEASE_TABLET | Freq: Two times a day (BID) | ORAL | 5 refills | Status: DC

## 2019-12-26 ENCOUNTER — Telehealth: Payer: Self-pay | Admitting: Family

## 2019-12-26 MED ORDER — METOLAZONE 2.5 MG PO TABS
2.5000 mg | ORAL_TABLET | Freq: Every day | ORAL | 0 refills | Status: DC
Start: 2019-12-26 — End: 2020-08-03

## 2019-12-26 NOTE — Telephone Encounter (Signed)
Returned patient's call regarding her symptoms. She says that for the last 3 days, her shortness of breath has worsened and her swelling in her feet/ legs/ abdomen are also worsening.   She is unable to get transportation to come for IV lasix and currently has cardiology f/u appointment scheduled on 12/29/19.   Will send in 2 days of metolazone 2.5mg . Reminded her that she needed to take 2 extra potassium tablets each day that she takes the metolazone. Emphasized that she needed to keep her cardiology f/u appointment since she already has transportation scheduled to that appt. Should symptoms worsen, she's to present to the ER.

## 2019-12-29 ENCOUNTER — Ambulatory Visit: Payer: Medicare HMO | Admitting: Physician Assistant

## 2019-12-30 ENCOUNTER — Ambulatory Visit: Payer: Medicare HMO | Admitting: Physician Assistant

## 2020-01-01 ENCOUNTER — Other Ambulatory Visit: Payer: Self-pay | Admitting: Cardiovascular Disease

## 2020-01-02 NOTE — Progress Notes (Signed)
Date:  01/03/2020   ID:  Tina Hayes, DOB May 01, 1945, MRN [id number]  Patient Location:  [address] [place] [address]   Provider location:   [facility], [place] office  PCP:  Herminio Commons, MD  Cardiologist:  [facility]  Chief Complaint  Patient presents with  . other    1 month follow up. Meds. reviewed by the pt. verbally. Pt, c/o pain in legs with walking, holding a lot fluid in her body with heart fluttering, difficulty breathing with having to take NTG pill last night due to chest pain.     History of Present Illness:    Tina Hayes is a 74 y.o. 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, 24 to 74 yo  Chronic back pain,   HTN  seizure disorder  hospital admission 06/17/2016 for cough, chest pain, shortness of breath,  stress test showing no ischemia PAD: Ulceration right lower extremity,  felt to be atypical in nature, pain at rest, reproducible on palpation who presents  for follow-up of her chest pain, chronic diastolic CHF  Seen by one of our providers November 25, 2019 At that time weight 266 pounds Amlodipine was decreased down to 5 mg daily  In the hospital June 2021 with knee replacement Still recovery, "its good"  Weight down, 50 pounds over 2 years Changed her diet Now able to walk more given recent knee replacement  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 Lucky Cowboy  Percutaneous transluminal angioplasty  of right anterior tibial artery with 3 mm diameter angioplasty balloon Percutaneous transluminal angioplasty of the right popliteal artery with 5 mm diameter Lutonix drug-coated angioplasty balloon     Viabahn stent placement to the right popliteal artery with 6 mm diameter   Echocardiogram October 2020 ejection fraction 60% Right heart pressures 47 moderately elevated moderate MR    Other past medical history reviewed with her  hospital October 2018 for chest pain shortness of breath Felt to be atypical in nature, GI related, piece of meat stuck in her throat with pain radiating to both shoulders, .  Vomiting, unable to get any food or water down  Eventually piece of meat removed and she was able to swallow  history of GERD, she had ran out of her PPI   Prior CV studies:   The following studies were reviewed today:    Past Medical History:  Diagnosis Date  . (HFpEF) heart failure with preserved ejection fraction ([place])    a. 05/2016 Echo: EF 60-65%, mild to mod LVH, Gr1 DD, mild MR, mildly dil LA, mod TR, mildly to mod increased PASP.  [name] Acute diastolic heart failure ([place]) 01/27/2017  . Anxiety   . Arthritis   . Chest pain 06/16/2016  . CHF (congestive heart failure) ([place])   . Chronic back pain   . Chronic diastolic congestive heart failure ([place]) 02/13/2017  . COPD (chronic obstructive pulmonary disease) ([place])   .  Coronary artery disease    a. s/p remote PCI x 5;  b. 2006 s/p CABG x 3 ([place]); b. 05/2016 MV: attenuation corrected images w/o ischemia or wma-->Med rx.  . Coronary artery disease of native artery of native heart with stable angina pectoris ([place]) 06/17/2016  . Depression   . Diabetes mellitus without complication ([place])   . Essential hypertension 06/30/2016  . GERD (gastroesophageal reflux disease)   . Heart attack ([place])    Total of 3 per pt.  . Hypertension   . Hypertensive urgency 06/03/2015  . Seizure ([place])   . Seizures ([place])     Past Surgical History:  Procedure Laterality Date  . ABDOMINAL HYSTERECTOMY    . ANKLE SURGERY Right   . CATARACT EXTRACTION W/ INTRAOCULAR LENS  IMPLANT, BILATERAL    . COLONOSCOPY WITH PROPOFOL N/A 05/04/2017   Procedure: COLONOSCOPY WITH PROPOFOL;  Surgeon: [name], MD;  Location[facility];  Service: Endoscopy;  Laterality: N/A;  . CORONARY ANGIOPLASTY    . CORONARY ARTERY BYPASS GRAFT     TRIPLE BYPASS  . ESOPHAGOGASTRODUODENOSCOPY (EGD) WITH PROPOFOL N/A 05/04/2017   Procedure: ESOPHAGOGASTRODUODENOSCOPY (EGD) WITH PROPOFOL;  Surgeon: [name], MD;  Location[facility];  Service: Endoscopy;  Laterality: N/A;  . EXCISION MASS LOWER EXTREMETIES Right 03/02/2018   Procedure: EXCISION SOFT TISSUE MASS FROM MEDIAL ASPECT OF RIGHT ANKLE;  Surgeon: [name], MD;  Location: [facility] ORS;  Service: Orthopedics;  Laterality: Right;  . FRACTURE SURGERY    . JOINT REPLACEMENT     right  . KNEE SURGERY Right   . LOWER EXTREMITY ANGIOGRAPHY Right 07/14/2019   Procedure: LOWER EXTREMITY ANGIOGRAPHY;  Surgeon: Algernon Huxley, MD;  Location: [facility];  Service: Cardiovascular;  Laterality: Right;  . MOUTH SURGERY Left 07/14/2017  . TOTAL KNEE ARTHROPLASTY Left 09/27/2019   Procedure: TOTAL KNEE ARTHROPLASTY;  Surgeon: [name], MD;  Location: [facility] ORS;  Service: Orthopedics;  Laterality: Left;     Current Meds  Medication Sig  . albuterol (PROVENTIL HFA;VENTOLIN HFA) 108 (90 Base) MCG/ACT inhaler Inhale 2 puffs into the lungs every 6 (six) hours as needed for wheezing or shortness of breath.  [name] albuterol (PROVENTIL) (2.5 MG/3ML) 0.083% nebulizer solution Take 2.5 mg every 4 (four) hours as needed by nebulization for wheezing or shortness of breath.  [name] amLODipine (NORVASC) 5 MG tablet Take 1 tablet (5 mg total) by mouth daily.  [name] atorvastatin (LIPITOR) 40 MG tablet Take 1 tablet by mouth once daily  . B-D UF III MINI PEN NEEDLES 31G X 5 MM MISC USE WITH  INSULIN PEN INJECTIONS TWICE DAILY  . budesonide (PULMICORT) 0.5 MG/2ML nebulizer solution Take 0.5 mg by nebulization daily at 12 noon.   . carvedilol (COREG) 3.125 MG tablet Take 1 tablet (3.125 mg) by mouth once daily  . clopidogrel (PLAVIX) 75 MG tablet Take 1 tablet (75 mg total) by mouth daily.  . COMBIVENT RESPIMAT 20-100 MCG/ACT AERS respimat Inhale 1 puff into the lungs 4 (four) times daily as needed for wheezing.   . Dupilumab (DUPIXENT) 300 MG/2ML SOPN Inject 300 mg into the skin every 14 (fourteen) days.   . empagliflozin (JARDIANCE) 10 MG TABS tablet Take 10 mg by mouth daily.  [name] esomeprazole (NEXIUM) 40 MG capsule Take 40 mg by mouth 2 (two) times daily.  . ferrous sulfate 325 (65 FE) MG tablet Take 325 mg by mouth daily.  . isosorbide mononitrate (  IMDUR) 30 MG 24 hr tablet Take 1 tablet (30 mg total) by mouth 2 (two) times daily.  [name] LANTUS SOLOSTAR 100 UNIT/ML Solostar Pen Inject 30-40 Units into the skin See admin instructions. Inject 30 units subcutaneously in the morning at 40 units subcutaneously at bedtime.  . levETIRAcetam (KEPPRA) 750 MG tablet Take 750 mg by mouth 2 (two) times daily.  [name] losartan (COZAAR) 100 MG tablet Take 1 tablet (100 mg total) by mouth daily.  . meloxicam (MOBIC) 7.5 MG tablet Take 7.5 mg by mouth daily as needed for pain.   . metolazone (ZAROXOLYN) 2.5 MG tablet Take 1 tablet (2.5 mg total) by mouth daily. 1/2 hour prior to torsemide  . mometasone-formoterol (DULERA) 200-5 MCG/ACT AERO Inhale 2 puffs into the lungs 2 (two) times daily.  . montelukast (SINGULAIR) 10 MG tablet Take 10 mg by mouth daily.   . nitroGLYCERIN (NITROSTAT) 0.4 MG SL tablet DISSOLVE ONE TABLET UNDER THE TONGUE EVERY 5 MINUTES AS NEEDED FOR CHEST PAIN.  DO NOT EXCEED A TOTAL OF 3 DOSES IN 15 MINUTES (Patient taking differently: Place 0.4 mg under the tongue every 5 (five) minutes as needed for chest pain. )  . NOVOLOG FLEXPEN 100 UNIT/ML FlexPen Inject 10 Units into the skin 3  (three) times daily with meals.  . potassium chloride SA (KLOR-CON) 20 MEQ tablet Take 2 tablets (40 mEq total) by mouth 2 (two) times daily. Take extra 2 tablets (40 meq) if you take metolazone  . rOPINIRole (REQUIP) 1 MG tablet Take 1 mg by mouth at bedtime.   . sertraline (ZOLOFT) 100 MG tablet Take 100 mg by mouth at bedtime.  [name] tiotropium (SPIRIVA) 18 MCG inhalation capsule Place 18 mcg into inhaler and inhale daily.  [name] torsemide (DEMADEX) 20 MG tablet Take 3 tablets (60 mg total) by mouth 2 (two) times daily.  . traMADol (ULTRAM) 50 MG tablet Take 1 tablet (50 mg total) by mouth every 6 (six) hours.  . traZODone (DESYREL) 50 MG tablet Take 50 mg by mouth at bedtime.     Allergies:   Aspirin   Social History   Tobacco Use  . Smoking status: Former Smoker    Packs/day: 0.25    Years: 20.00    Pack years: 5.00    Types: Cigarettes    Quit date: 07/01/2006    Years since quitting: 13.5  . Smokeless tobacco: Never Used  Vaping Use  . Vaping Use: Never used  Substance Use Topics  . Alcohol use: No    Alcohol/week: 0.0 standard drinks  . Drug use: No     Family Hx: The patient's family history includes Cancer in her sister; Depression in her mother; Diabetes in her brother, mother, and another family member; Heart attack in her mother and sister; Heart disease in her mother; Heart failure in her mother and sister; Hypertension in her brother, mother, sister, and another family member; SIDS in her sister; Stroke in her mother.  ROS:   Please see the history of present illness.    Review of Systems  Constitutional: Negative.   Respiratory: Negative.   Cardiovascular: Negative.   Gastrointestinal: Negative.   Musculoskeletal: Positive for joint pain.  Neurological: Negative.   Psychiatric/Behavioral: Negative.   All other systems reviewed and are negative.    Labs/Other Tests and Data Reviewed:    Recent Labs: 02/01/2019: B Natriuretic Peptide 64.0 07/22/2019: ALT  13 09/30/2019: BUN 14; Creatinine, Ser 1.05; Hemoglobin 8.9; Platelets 238; Potassium 3.8; Sodium 143  Recent Lipid Panel Lab Results  Component Value Date/Time   CHOL 120 01/30/2017 04:04 AM   TRIG 73 01/30/2017 04:04 AM   HDL 47 01/30/2017 04:04 AM   CHOLHDL 2.6 01/30/2017 04:04 AM   LDLCALC 58 01/30/2017 04:04 AM    Wt Readings from Last 3 Encounters:  01/03/20 267 lb (121.1 kg)  11/25/19 (!) 266 lb 8 oz (120.9 kg)  09/30/19 291 lb 14.2 oz (132.4 kg)     Exam:    Vital Signs: Vital signs may also be detailed in the HPI BP (!) 108/58 (BP Location: Left Arm, Patient Position: Sitting, Cuff Size: Normal)   Pulse (!) 52   Ht 5\' 11"  (1.803 m)   Wt 267 lb (121.1 kg)   SpO2 98%   BMI 37.24 kg/m    Constitutional:  oriented to person, place, and time. No distress.  Obese, in a wheelchair HENT:  Head: Grossly normal Eyes:  no discharge. No scleral icterus.  Neck: No JVD, no carotid bruits  Cardiovascular: Regular rate and rhythm, no murmurs appreciated Pulmonary/Chest: Clear to auscultation bilaterally, no wheezes or rails Abdominal: Soft.  no distension.  no tenderness.  Musculoskeletal: Normal range of motion Neurological:  normal muscle tone. Coordination normal. No atrophy Skin: Skin warm and dry Psychiatric: normal affect, pleasant   ASSESSMENT & PLAN:    Chronic diastolic congestive heart failure (HCC) Appears euvolemic, weight down to 266 and holding Blood work stable from June No changes to her medications  Type 2 diabetes mellitus without complication, with long-term current use of insulin (HCC) A1c monitored by primary care, weight down 50 pounds through dietary changes  Coronary artery disease of native artery of native heart with stable angina pectoris ([place]) Currently with no symptoms of angina. No further workup at this time. Continue current medication regimen.  Essential hypertension Blood pressure is well controlled on today's visit. No changes made  to the medications.  Morbid obesity ([place]) We have encouraged continued exercise, careful diet management in an effort to lose weight.  Centrilobular emphysema ([place]) Prior smoking, stopped years ago Reports breathing is stable Recommend continued weight loss   Total encounter time more than 25 minutes  Greater than 50% was spent in counseling and coordination of care with the patient    Signed, Ida Rogue, MD  01/03/2020 10:41 AM    [place] [facility] [address], [place], [place] [address]

## 2020-01-03 ENCOUNTER — Encounter: Payer: Self-pay | Admitting: Cardiovascular Disease

## 2020-01-03 ENCOUNTER — Ambulatory Visit (INDEPENDENT_AMBULATORY_CARE_PROVIDER_SITE_OTHER): Payer: Medicare HMO | Admitting: Cardiovascular Disease

## 2020-01-03 ENCOUNTER — Other Ambulatory Visit: Payer: Self-pay

## 2020-01-03 VITALS — BP 108/58 | HR 52 | Ht 71.0 in | Wt 267.0 lb

## 2020-01-03 DIAGNOSIS — Z794 Long term (current) use of insulin: Secondary | ICD-10-CM

## 2020-01-03 DIAGNOSIS — Z951 Presence of aortocoronary bypass graft: Secondary | ICD-10-CM | POA: Diagnosis not present

## 2020-01-03 DIAGNOSIS — I5032 Chronic diastolic (congestive) heart failure: Secondary | ICD-10-CM | POA: Diagnosis not present

## 2020-01-03 DIAGNOSIS — I25118 Atherosclerotic heart disease of native coronary artery with other forms of angina pectoris: Secondary | ICD-10-CM | POA: Diagnosis not present

## 2020-01-03 DIAGNOSIS — E119 Type 2 diabetes mellitus without complications: Secondary | ICD-10-CM | POA: Diagnosis not present

## 2020-01-03 DIAGNOSIS — J449 Chronic obstructive pulmonary disease, unspecified: Secondary | ICD-10-CM

## 2020-01-03 DIAGNOSIS — G4733 Obstructive sleep apnea (adult) (pediatric): Secondary | ICD-10-CM

## 2020-01-03 MED ORDER — METOPROLOL TARTRATE 25 MG PO TABS
25.0000 mg | ORAL_TABLET | Freq: Two times a day (BID) | ORAL | 3 refills | Status: DC | PRN
Start: 2020-01-03 — End: 2020-05-04

## 2020-01-03 NOTE — Patient Instructions (Addendum)
Medication Instructions:  Please take metoprolol 25 mg as needed up to twice a day for heart arrhythmia, fast heart rate   If you need a refill on your cardiac medications before your next appointment, please call your pharmacy.    Lab work: No new labs needed   If you have labs (blood work) drawn today and your tests are completely normal, you will receive your results only by: [name] MyChart Message (if you have MyChart) OR . A paper copy in the mail If you have any lab test that is abnormal or we need to change your treatment, we will call you to review the results.   Testing/Procedures: No new testing needed   Follow-Up: At [facility], you and your health needs are our priority.  As part of our continuing mission to provide you with exceptional heart care, we have created designated Provider Care Teams.  These Care Teams include your primary Cardiologist (physician) and Advanced Practice Providers (APPs -  Physician Assistants and Nurse Practitioners) who all work together to provide you with the care you need, when you need it.  . You will need a follow up appointment in 6 months  . Providers on your designated Care Team:   . Murray Hodgkins, NP . Christell Faith, PA-C . Marrianne Mood, PA-C  Any Other Special Instructions Will Be Listed Below (If Applicable).  COVID-19 Vaccine Information can be found at: [email or web address].uk For questions related to vaccine distribution or appointments, please email [email or web address] or call [phone].

## 2020-01-08 ENCOUNTER — Emergency Department: Payer: Medicare HMO

## 2020-01-08 ENCOUNTER — Encounter: Payer: Self-pay | Admitting: Emergency Medicine

## 2020-01-08 ENCOUNTER — Other Ambulatory Visit: Payer: Self-pay

## 2020-01-08 ENCOUNTER — Emergency Department
Admission: EM | Admit: 2020-01-08 | Discharge: 2020-01-08 | Disposition: A | Discharge status: Home / Self Care | Payer: Medicare HMO | Attending: Emergency Medicine | Admitting: Emergency Medicine

## 2020-01-08 DIAGNOSIS — J441 Chronic obstructive pulmonary disease with (acute) exacerbation: Secondary | ICD-10-CM | POA: Insufficient documentation

## 2020-01-08 DIAGNOSIS — Z7951 Long term (current) use of inhaled steroids: Secondary | ICD-10-CM | POA: Insufficient documentation

## 2020-01-08 DIAGNOSIS — M5442 Lumbago with sciatica, left side: Secondary | ICD-10-CM | POA: Insufficient documentation

## 2020-01-08 DIAGNOSIS — E119 Type 2 diabetes mellitus without complications: Secondary | ICD-10-CM | POA: Insufficient documentation

## 2020-01-08 DIAGNOSIS — M5441 Lumbago with sciatica, right side: Secondary | ICD-10-CM | POA: Insufficient documentation

## 2020-01-08 DIAGNOSIS — I110 Hypertensive heart disease with heart failure: Secondary | ICD-10-CM | POA: Insufficient documentation

## 2020-01-08 DIAGNOSIS — Z87891 Personal history of nicotine dependence: Secondary | ICD-10-CM | POA: Diagnosis not present

## 2020-01-08 DIAGNOSIS — M5432 Sciatica, left side: Secondary | ICD-10-CM

## 2020-01-08 DIAGNOSIS — Z79899 Other long term (current) drug therapy: Secondary | ICD-10-CM | POA: Diagnosis not present

## 2020-01-08 DIAGNOSIS — I2510 Atherosclerotic heart disease of native coronary artery without angina pectoris: Secondary | ICD-10-CM | POA: Diagnosis not present

## 2020-01-08 DIAGNOSIS — I5032 Chronic diastolic (congestive) heart failure: Secondary | ICD-10-CM | POA: Insufficient documentation

## 2020-01-08 DIAGNOSIS — Z955 Presence of coronary angioplasty implant and graft: Secondary | ICD-10-CM | POA: Insufficient documentation

## 2020-01-08 DIAGNOSIS — M549 Dorsalgia, unspecified: Secondary | ICD-10-CM | POA: Diagnosis present

## 2020-01-08 DIAGNOSIS — Z96652 Presence of left artificial knee joint: Secondary | ICD-10-CM | POA: Diagnosis not present

## 2020-01-08 LAB — URINALYSIS, COMPLETE (UACMP) WITH MICROSCOPIC
Bacteria, UA: NONE SEEN
Bilirubin Urine: NEGATIVE
Glucose, UA: NEGATIVE mg/dL
Hgb urine dipstick: NEGATIVE
Ketones, ur: NEGATIVE mg/dL
Leukocytes,Ua: NEGATIVE
Nitrite: NEGATIVE
Protein, ur: NEGATIVE mg/dL
Specific Gravity, Urine: 1.013 (ref 1.005–1.030)
pH: 7 (ref 5.0–8.0)

## 2020-01-08 MED ORDER — LIDOCAINE 4 % EX PTCH: 1.0000 | MEDICATED_PATCH | Freq: Two times a day (BID) | CUTANEOUS | 0 refills | Status: AC

## 2020-01-08 MED ORDER — ONDANSETRON HCL 4 MG/2ML IJ SOLN
4.0000 mg | Freq: Once | INTRAMUSCULAR | Status: AC
  Administered 2020-01-08: 4 mg via INTRAVENOUS
  Filled 2020-01-08: qty 2

## 2020-01-08 MED ORDER — FENTANYL CITRATE (PF) 100 MCG/2ML IJ SOLN
50.0000 ug | Freq: Once | INTRAMUSCULAR | Status: AC
  Administered 2020-01-08: 50 ug via INTRAVENOUS
  Filled 2020-01-08: qty 2

## 2020-01-08 NOTE — ED Provider Notes (Signed)
[facility] Emergency Department Provider Note  ____________________________________________   First MD Initiated Contact with Patient 01/08/20 1704     (approximate)  I have reviewed the triage vital signs and the nursing notes.   HISTORY  Chief Complaint Back Pain   HPI Tina Hayes is a 74 y.o. female who presents to the emergency department for worsening back pain with bilateral sciatica.  The patient has an extensive history of back pain with sciatica treated by pain management.  The patient was last seen in June and given a prescription for hydrocodone for July and August.  She states she ran out of this 3 days ago and her symptoms have been significantly worsening.  She says that she is due for an epidural injection but was not able to get in with Dr. Andree Elk office yet.  She has an appointment on Wednesday of this week to see them in pain management.  Patient denies any red flag symptoms of fever, loss of bowel or bladder.  She does state that her bilateral legs feel like prickly numb but she has not had any decreased motor function, falls etc.  There is no new acute trauma to her back.  Denies dysuria.         Past Medical History:  Diagnosis Date   (HFpEF) heart failure with preserved ejection fraction ([place])    a. 05/2016 Echo: EF 60-65%, mild to mod LVH, Gr1 DD, mild MR, mildly dil LA, mod TR, mildly to mod increased PASP.   Acute diastolic heart failure (HCC) 01/27/2017   Anxiety    Arthritis    Chest pain 06/16/2016   CHF (congestive heart failure) (HCC)    Chronic back pain    Chronic diastolic congestive heart failure ([place]) 02/13/2017   COPD (chronic obstructive pulmonary disease) ([place])    Coronary artery disease    a. s/p remote PCI x 5;  b. 2006 s/p CABG x 3 ([place], [place]); b. 05/2016 MV: attenuation corrected images w/o ischemia or wma-->Med rx.   Coronary artery disease of native artery of native  heart with stable angina pectoris ([place]) 06/17/2016   Depression    Diabetes mellitus without complication ([place])    Essential hypertension 06/30/2016   GERD (gastroesophageal reflux disease)    Heart attack ([place])    Total of 3 per pt.   Hypertension    Hypertensive urgency 06/03/2015   Seizure ([place])    Seizures [facility])     Patient Active Problem List   Diagnosis Date Noted   Status post total knee replacement using cement, 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   Atherosclerosis of native arteries of the extremities with ulceration ([place]) 07/05/2019   Onychomycosis 05/30/2019   Occlusion and stenosis of vertebral artery 08/31/2018   AKI (acute kidney injury) ([place]) 05/25/2018   BMI 50.0-59.9, adult ([place]) 04/16/2018   Seizures ([place]) 11/10/2017   Mass of right lower leg 09/11/2017   Dysphagia 04/23/2017   Chronic diastolic congestive heart failure ([place]) 40/98/1191   Acute diastolic heart failure ([place]) 01/27/2017   Depression 12/11/2016   Essential hypertension 06/30/2016   Snoring 06/30/2016   Diabetes mellitus ([place]) 06/30/2016   Coronary artery disease of native artery of native heart with stable angina pectoris ([place]) 06/17/2016   Morbid obesity ([place]) 06/17/2016   COPD (chronic obstructive pulmonary disease) ([place]) 08/28/2015   COPD with acute exacerbation ([place]) 04/14/2015    Past Surgical History:  Procedure Laterality Date   ABDOMINAL HYSTERECTOMY  ANKLE SURGERY Right    CATARACT EXTRACTION W/ INTRAOCULAR LENS  IMPLANT, BILATERAL     COLONOSCOPY WITH PROPOFOL N/A 05/04/2017   Procedure: COLONOSCOPY WITH PROPOFOL;  Surgeon: [name], MD;  Location: [facility] ENDOSCOPY;  Service: Endoscopy;  Laterality: N/A;   CORONARY ANGIOPLASTY     CORONARY ARTERY BYPASS GRAFT     TRIPLE BYPASS   ESOPHAGOGASTRODUODENOSCOPY (EGD) WITH PROPOFOL N/A 05/04/2017   Procedure: ESOPHAGOGASTRODUODENOSCOPY (EGD) WITH PROPOFOL;  Surgeon: [name], MD;  Location: [facility] ENDOSCOPY;   Service: Endoscopy;  Laterality: N/A;   EXCISION MASS LOWER EXTREMETIES Right 03/02/2018   Procedure: EXCISION SOFT TISSUE MASS FROM MEDIAL ASPECT OF RIGHT ANKLE;  Surgeon: [name], MD;  Location: [facility] ORS;  Service: Orthopedics;  Laterality: Right;   FRACTURE SURGERY     JOINT REPLACEMENT     right   KNEE SURGERY Right    LOWER EXTREMITY ANGIOGRAPHY Right 07/14/2019   Procedure: LOWER EXTREMITY ANGIOGRAPHY;  Surgeon: Algernon Huxley, MD;  Location: [facility];  Service: Cardiovascular;  Laterality: Right;   MOUTH SURGERY Left 07/14/2017   TOTAL KNEE ARTHROPLASTY Left 09/27/2019   Procedure: TOTAL KNEE ARTHROPLASTY;  Surgeon: [name], MD;  Location: [facility] ORS;  Service: Orthopedics;  Laterality: Left;    Prior to Admission medications   Medication Sig Start Date End Date Taking? Authorizing Provider  albuterol (PROVENTIL HFA;VENTOLIN HFA) 108 (90 Base) MCG/ACT inhaler Inhale 2 puffs into the lungs every 6 (six) hours as needed for wheezing or shortness of breath. 08/30/15   Epifanio Lesches, MD  albuterol (PROVENTIL) (2.5 MG/3ML) 0.083% nebulizer solution Take 2.5 mg every 4 (four) hours as needed by nebulization for wheezing or shortness of breath.    [provider]  amLODipine (NORVASC) 5 MG tablet Take 1 tablet (5 mg total) by mouth daily. 11/25/19 02/23/20  Marrianne Mood D, PA-C  atorvastatin (LIPITOR) 40 MG tablet Take 1 tablet by mouth once daily 01/03/20   Minna Merritts, MD  B-D UF III MINI PEN NEEDLES 31G X 5 MM MISC USE WITH INSULIN PEN INJECTIONS TWICE DAILY 08/05/18   [provider]  budesonide (PULMICORT) 0.5 MG/2ML nebulizer solution Take 0.5 mg by nebulization daily at 12 noon.     [provider]  carvedilol (COREG) 3.125 MG tablet Take 1 tablet (3.125 mg) by mouth once daily    [provider]  clopidogrel (PLAVIX) 75 MG tablet Take 1 tablet (75 mg total) by mouth daily. 05/19/19   Darylene Price A, FNP  COMBIVENT  RESPIMAT 20-100 MCG/ACT AERS respimat Inhale 1 puff into the lungs 4 (four) times daily as needed for wheezing.  07/30/18   [provider]  Dupilumab (DUPIXENT) 300 MG/2ML SOPN Inject 300 mg into the skin every 14 (fourteen) days.     [provider]  empagliflozin (JARDIANCE) 10 MG TABS tablet Take 10 mg by mouth daily.    [provider]  esomeprazole (NEXIUM) 40 MG capsule Take 40 mg by mouth 2 (two) times daily. 06/20/19   [provider]  ferrous sulfate 325 (65 FE) MG tablet Take 325 mg by mouth daily.    [provider]  isosorbide mononitrate (IMDUR) 30 MG 24 hr tablet Take 1 tablet (30 mg total) by mouth 2 (two) times daily. 11/30/19   Minna Merritts, MD  LANTUS SOLOSTAR 100 UNIT/ML Solostar Pen Inject 30-40 Units into the skin See admin instructions. Inject 30 units subcutaneously in the morning at 40  units subcutaneously at bedtime. 05/26/19   [provider]  levETIRAcetam (KEPPRA) 750 MG tablet Take 750 mg by mouth 2 (two) times daily.    [provider]  Lidocaine (HM LIDOCAINE PATCH) 4 % PTCH Apply 1 patch topically every 12 (twelve) hours for 5 days. 01/08/20 01/13/20  Marlana Salvage, PA  losartan (COZAAR) 100 MG tablet Take 1 tablet (100 mg total) by mouth daily. 08/17/18   Minna Merritts, MD  meloxicam (MOBIC) 7.5 MG tablet Take 7.5 mg by mouth daily as needed for pain.     [provider]  metolazone (ZAROXOLYN) 2.5 MG tablet Take 1 tablet (2.5 mg total) by mouth daily. 1/2 hour prior to torsemide 12/26/19 03/25/20  Darylene Price A, FNP  metoprolol tartrate (LOPRESSOR) 25 MG tablet Take 1 tablet (25 mg total) by mouth 2 (two) times daily as needed. 01/03/20   Minna Merritts, MD  mometasone-formoterol (DULERA) 200-5 MCG/ACT AERO Inhale 2 puffs into the lungs 2 (two) times daily. 02/03/19   Vaughan Basta, MD  montelukast (SINGULAIR) 10 MG tablet Take 10 mg by mouth daily.  11/05/17   [provider]  nitroGLYCERIN (NITROSTAT) 0.4 MG SL tablet DISSOLVE ONE TABLET UNDER THE TONGUE EVERY 5 MINUTES AS NEEDED FOR CHEST PAIN.  DO NOT EXCEED A TOTAL OF 3 DOSES IN 15 MINUTES Patient taking differently: Place 0.4 mg under the tongue every 5 (five) minutes as needed for chest pain.  07/09/18   Gollan, Kathlene November, MD  NOVOLOG FLEXPEN 100 UNIT/ML FlexPen Inject 10 Units into the skin 3 (three) times daily with meals. 06/07/19   [provider]  potassium chloride SA (KLOR-CON) 20 MEQ tablet Take 2 tablets (40 mEq total) by mouth 2 (two) times daily. Take extra 2 tablets (40 meq) if you take metolazone 12/13/19   Darylene Price A, FNP  rOPINIRole (REQUIP) 1 MG tablet Take 1 mg by mouth at bedtime.     [provider]  sertraline (ZOLOFT) 100 MG tablet Take 100 mg by mouth at bedtime. 04/18/19   [provider]  tiotropium (SPIRIVA) 18 MCG inhalation capsule Place 18 mcg into inhaler and inhale daily.    [provider]  torsemide (DEMADEX) 20 MG tablet Take 3 tablets (60 mg total) by mouth 2 (two) times daily. 03/23/19   Alisa Graff, FNP  traMADol (ULTRAM) 50 MG tablet Take 1 tablet (50 mg total) by mouth every 6 (six) hours. 09/30/19   Lattie Corns, PA-C  traZODone (DESYREL) 50 MG tablet Take 50 mg by mouth at bedtime.    [provider]    Allergies Aspirin  Family History  Problem Relation Age of Onset   Diabetes Other    Hypertension Other    Diabetes Mother    Heart failure Mother    Heart disease Mother    Heart attack Mother    Stroke Mother    Depression Mother    Hypertension Mother    Cancer Sister        brain   Hypertension Sister    Diabetes Brother    Hypertension Brother    Heart failure Sister    Heart attack Sister    SIDS Sister     Social History Social History   Tobacco Use   Smoking status: Former Smoker    Packs/day: 0.25    Years: 20.00    Pack years: 5.00    Types: Cigarettes    Quit  date: 07/01/2006  Years since quitting: 13.5   Smokeless tobacco: Never Used  Vaping Use   Vaping Use: Never used  Substance Use Topics   Alcohol use: No    Alcohol/week: 0.0 standard drinks   Drug use: No    Review of Systems Constitutional: No fever/chills Eyes: No visual changes. ENT: No sore throat. Cardiovascular: Denies chest pain. Respiratory: Denies shortness of breath. Gastrointestinal: No abdominal pain.  No nausea, no vomiting.  No diarrhea.  No constipation. Genitourinary: Negative for dysuria. Musculoskeletal: + Back pain with sciatica Skin: Negative for rash. Neurological: Negative for headaches, focal weakness or numbness.  ____________________________________________   PHYSICAL EXAM:  VITAL SIGNS: ED Triage Vitals [01/08/20 1555]  Enc Vitals Group     BP (!) 123/91     Pulse Rate (!) 51     Resp (!) 24     Temp 97.9 F (36.6 C)     Temp Source Oral     SpO2 100 %     Weight 267 lb (121.1 kg)     Height 5\' 11"  (1.803 m)     Head Circumference      Peak Flow      Pain Score 10     Pain Loc      Pain Edu?      Excl. in [place]?     Constitutional: Alert and oriented.  The patient is tearful and appears in discomfort lying left side. Eyes: Conjunctivae are normal. PERRL. EOMI. Head: Atraumatic. Nose: No congestion/rhinnorhea. Mouth/Throat: Mucous membranes are moist.  Neck: No stridor.   Cardiovascular: Normal rate, regular rhythm. Grossly normal heart sounds.  Respiratory: Normal respiratory effort.  No retractions. Lungs CTAB. Gastrointestinal: Soft and nontender. No distention. No CVA tenderness. Musculoskeletal: There is tenderness to palpation of the lumbar spine, left and right paraspinals as well as thoracic spine.  The patient has 5/5 strength in knee flexion, knee extension, ankle plantarflexion, ankle dorsiflexion. Neurologic:  Normal speech and language.  Skin:  Skin is warm, dry and intact. No rash noted. Psychiatric: Mood and affect  are normal. Speech and behavior are normal.  ____________________________________________   LABS (all labs ordered are listed, but only abnormal results are displayed)  Labs Reviewed  URINALYSIS, COMPLETE (UACMP) WITH MICROSCOPIC - Abnormal; Notable for the following components:      Result Value   Color, Urine YELLOW (*)    APPearance CLEAR (*)    All other components within normal limits    ____________________________________________  RADIOLOGY   Official radiology report(s): DG Lumbar Spine 2-3 Views  Result Date: 01/08/2020 CLINICAL DATA:  Low back pain with bilateral sciatica, chronic low back pain EXAM: LUMBAR SPINE - 2-3 VIEW COMPARISON:  None FINDINGS: Marked lumbar spine degenerative changes with bony fusion of L2-3 and mild levo convexity of the lumbar spine centered about L2-3. Disc space narrowing greatest at this level but also present at L1-2, L3-4 and L4-5. Osteopenia. No sign of acute fracture or malalignment. 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 Incidental imaging of the lower thoracic spine shows moderate degenerative changes to marked degenerative changes also in the thoracic spine. IMPRESSION: Marked lumbar spine degenerative changes and osteopenia. No acute fracture or static malalignment. Electronically Signed   By: Zetta Bills M.D.   On: 01/08/2020 18:44    ____________________________________________   INITIAL IMPRESSION / ASSESSMENT AND PLAN / ED COURSE  As part of my medical decision making, I reviewed the following data within the [facility] notes reviewed  and incorporated, Notes from prior ED visits and [place] Controlled Substance Database        Lizeth Bencosme is a 74 year old female who presents to the emergency department for evaluation of worsening low back pain with bilateral sciatica over the last week.  She states that she ran out of her pain management prescribed hydrocodone 3 days ago and  will not see pain management again until this Wednesday.  She states that her bilateral lower extremities are a numb prickly feeling.  She denies red flag symptoms such as fever, loss of bowel or bladder.  Physical exam is reassuring and that the patient still has 5/5 strength in gross motor movements of the lower extremities.  X-rays demonstrate stable known low back problems without evidence of acute fracture.  The patient was given 1 dose of fentanyl for pain management in the emergency department.  I had a long discussion with the patient that I could not give her a prescription for narcotic pain medicine on an outpatient basis as this will interfere with her interventions with pain management.  I also discussed that she is not a great candidate for muscle relaxers due to her age and increased risk of falls and fractures.  I also discussed with the patient that while I could put her on prednisone, I do not want this to interfere with any potential epidural injection that she may be eligible for soon and this could prolong the timing of her receiving the epidural injection.  The patient acknowledged understanding of these limitations.  We discussed that at this time are only viable pain treatment option would be a lidocaine patch that she can replace every 12 hours.  The patient is amenable with this plan and she is stable at this time for outpatient therapy.  The patient was advised to return should she experience worsening pain, loss of bowel or bladder or other concerning symptoms.      ____________________________________________   FINAL CLINICAL IMPRESSION(S) / ED DIAGNOSES  Final diagnoses:  Bilateral sciatica     ED Discharge Orders         Ordered    Lidocaine (HM LIDOCAINE PATCH) 4 % PTCH  Every 12 hours        01/08/20 2001          *Please note:  Tina Hayes was evaluated in Emergency Department on 01/08/2020 for the symptoms described in the history of present illness. She  was evaluated in the context of the global COVID-19 pandemic, which necessitated consideration that the patient might be at risk for infection with the SARS-CoV-2 virus that causes COVID-19. Institutional protocols and algorithms that pertain to the evaluation of patients at risk for COVID-19 are in a state of rapid change based on information released by regulatory bodies including the CDC and federal and state organizations. These policies and algorithms were followed during the patient's care in the ED.  Some ED evaluations and interventions may be delayed as a result of limited staffing during and the pandemic.*   Note:  This document was prepared using Dragon voice recognition software and may include unintentional dictation errors.    Marlana Salvage, PA 01/08/20 2208    Ellisa Mew, MD 01/08/20 2303

## 2020-01-08 NOTE — ED Triage Notes (Signed)
Pt presents to ED via POV with c/o chronic back pain. Pt states they normally do epidural injections but she has not been to her doctor to get an epidural injection. Pt states pain radiates down bilateral legs. Pt states pain not relieved with position changes.  Pt with full ROM noted to all extremities at this time.

## 2020-01-10 ENCOUNTER — Other Ambulatory Visit: Payer: Self-pay | Admitting: Physician Assistant

## 2020-01-10 DIAGNOSIS — Z1231 Encounter for screening mammogram for malignant neoplasm of breast: Secondary | ICD-10-CM

## 2020-01-10 DIAGNOSIS — M8580 Other specified disorders of bone density and structure, unspecified site: Secondary | ICD-10-CM

## 2020-01-10 DIAGNOSIS — Z13820 Encounter for screening for osteoporosis: Secondary | ICD-10-CM

## 2020-01-11 ENCOUNTER — Other Ambulatory Visit: Payer: Self-pay | Admitting: Anesthesiology

## 2020-01-11 ENCOUNTER — Encounter: Payer: Self-pay | Admitting: Anesthesiology

## 2020-01-11 ENCOUNTER — Ambulatory Visit
Admission: RE | Admit: 2020-01-11 | Discharge: 2020-01-11 | Disposition: A | Discharge status: Home / Self Care | Payer: Medicare HMO | Source: Ambulatory Visit | Attending: Anesthesiology | Admitting: Anesthesiology

## 2020-01-11 ENCOUNTER — Ambulatory Visit (HOSPITAL_BASED_OUTPATIENT_CLINIC_OR_DEPARTMENT_OTHER): Payer: Medicare HMO | Admitting: Anesthesiology

## 2020-01-11 ENCOUNTER — Other Ambulatory Visit: Payer: Self-pay

## 2020-01-11 VITALS — BP 152/89 | HR 53 | Temp 97.0°F | Resp 18 | Ht 71.0 in | Wt 267.0 lb

## 2020-01-11 DIAGNOSIS — R52 Pain, unspecified: Secondary | ICD-10-CM | POA: Diagnosis present

## 2020-01-11 DIAGNOSIS — M170 Bilateral primary osteoarthritis of knee: Secondary | ICD-10-CM | POA: Insufficient documentation

## 2020-01-11 DIAGNOSIS — G894 Chronic pain syndrome: Secondary | ICD-10-CM

## 2020-01-11 DIAGNOSIS — M47817 Spondylosis without myelopathy or radiculopathy, lumbosacral region: Secondary | ICD-10-CM | POA: Insufficient documentation

## 2020-01-11 DIAGNOSIS — M5136 Other intervertebral disc degeneration, lumbar region: Secondary | ICD-10-CM | POA: Insufficient documentation

## 2020-01-11 DIAGNOSIS — M5431 Sciatica, right side: Secondary | ICD-10-CM | POA: Diagnosis not present

## 2020-01-11 DIAGNOSIS — M48062 Spinal stenosis, lumbar region with neurogenic claudication: Secondary | ICD-10-CM | POA: Diagnosis not present

## 2020-01-11 DIAGNOSIS — F1190 Opioid use, unspecified, uncomplicated: Secondary | ICD-10-CM

## 2020-01-11 DIAGNOSIS — M25512 Pain in left shoulder: Secondary | ICD-10-CM | POA: Diagnosis present

## 2020-01-11 DIAGNOSIS — M5432 Sciatica, left side: Secondary | ICD-10-CM

## 2020-01-11 DIAGNOSIS — M4716 Other spondylosis with myelopathy, lumbar region: Secondary | ICD-10-CM | POA: Insufficient documentation

## 2020-01-11 MED ORDER — MIDAZOLAM HCL 2 MG/2ML IJ SOLN
5.0000 mg | Freq: Once | INTRAMUSCULAR | Status: AC
  Administered 2020-01-11: 2 mg via INTRAVENOUS

## 2020-01-11 MED ORDER — SODIUM CHLORIDE 0.9% FLUSH
10.0000 mL | Freq: Once | INTRAVENOUS | Status: AC
  Administered 2020-01-11: 10 mL

## 2020-01-11 MED ORDER — TRIAMCINOLONE ACETONIDE 40 MG/ML IJ SUSP
40.0000 mg | Freq: Once | INTRAMUSCULAR | Status: AC
  Administered 2020-01-11: 40 mg
  Filled 2020-01-11: qty 1

## 2020-01-11 MED ORDER — MIDAZOLAM HCL 5 MG/5ML IJ SOLN
INTRAMUSCULAR | Status: AC
  Filled 2020-01-11: qty 5

## 2020-01-11 MED ORDER — LACTATED RINGERS IV SOLN
1000.0000 mL | INTRAVENOUS | Status: DC
  Administered 2020-01-11: 1000 mL via INTRAVENOUS

## 2020-01-11 MED ORDER — ROPIVACAINE HCL 2 MG/ML IJ SOLN
10.0000 mL | Freq: Once | INTRAMUSCULAR | Status: AC
  Administered 2020-01-11: 20 mL via EPIDURAL
  Filled 2020-01-11: qty 10

## 2020-01-11 MED ORDER — HYDROCODONE-ACETAMINOPHEN 7.5-325 MG PO TABS: 1.0000 | ORAL_TABLET | Freq: Four times a day (QID) | ORAL | 0 refills | Status: DC | PRN

## 2020-01-11 MED ORDER — IOHEXOL 180 MG/ML  SOLN
10.0000 mL | Freq: Once | INTRAMUSCULAR | Status: AC | PRN
  Administered 2020-01-11: 10 mL via EPIDURAL
  Filled 2020-01-11: qty 20

## 2020-01-11 MED ORDER — LIDOCAINE HCL (PF) 1 % IJ SOLN
5.0000 mL | Freq: Once | INTRAMUSCULAR | Status: AC
  Administered 2020-01-11: 5 mL via SUBCUTANEOUS
  Filled 2020-01-11: qty 5

## 2020-01-11 NOTE — Progress Notes (Signed)
Subjective:  Patient ID: Tina Hayes, female    DOB: 17-Nov-1945  Age: 74 y.o. MRN: [id number]  CC: Back Pain (low back) and Leg Pain (bilateral)   Procedure: L5-S1 epidural steroid with moderate sedation  HPI Tina Hayes presents for reevaluation.  She continues to have severe lower back pain with radiation into the bilateral anterior thighs and into the calves.  In the past she has responded favorably to epidural steroid injections.  She generally gets 75 to 80% relief lasting 6 to 8 weeks and she feels that it is time to proceed with a repeat injection.  And make her medications work more effectively as well.  She is denying side effects to her medications with no changes in bowel or bladder function.  She has been off her Plavix for 10 days.  Outpatient Medications Prior to Visit  Medication Sig Dispense Refill  . albuterol (PROVENTIL HFA;VENTOLIN HFA) 108 (90 Base) MCG/ACT inhaler Inhale 2 puffs into the lungs every 6 (six) hours as needed for wheezing or shortness of breath. 1 Inhaler 2  . albuterol (PROVENTIL) (2.5 MG/3ML) 0.083% nebulizer solution Take 2.5 mg every 4 (four) hours as needed by nebulization for wheezing or shortness of breath.    [name] amLODipine (NORVASC) 5 MG tablet Take 1 tablet (5 mg total) by mouth daily. 30 tablet 6  . atorvastatin (LIPITOR) 40 MG tablet Take 1 tablet by mouth once daily 30 tablet 0  . B-D UF III MINI PEN NEEDLES 31G X 5 MM MISC USE WITH INSULIN PEN INJECTIONS TWICE DAILY    . budesonide (PULMICORT) 0.5 MG/2ML nebulizer solution Take 0.5 mg by nebulization daily at 12 noon.     . carvedilol (COREG) 3.125 MG tablet Take 1 tablet (3.125 mg) by mouth once daily    . clopidogrel (PLAVIX) 75 MG tablet Take 1 tablet (75 mg total) by mouth daily. 90 tablet 3  . COMBIVENT RESPIMAT 20-100 MCG/ACT AERS respimat Inhale 1 puff into the lungs 4 (four) times daily as needed for wheezing.     . Dupilumab (DUPIXENT) 300 MG/2ML SOPN Inject 300 mg into the skin  every 14 (fourteen) days.     . empagliflozin (JARDIANCE) 10 MG TABS tablet Take 10 mg by mouth daily.    [name] esomeprazole (NEXIUM) 40 MG capsule Take 40 mg by mouth 2 (two) times daily.    . ferrous sulfate 325 (65 FE) MG tablet Take 325 mg by mouth daily.    . isosorbide mononitrate (IMDUR) 30 MG 24 hr tablet Take 1 tablet (30 mg total) by mouth 2 (two) times daily. 60 tablet 3  . LANTUS SOLOSTAR 100 UNIT/ML Solostar Pen Inject 30-40 Units into the skin See admin instructions. Inject 30 units subcutaneously in the morning at 40 units subcutaneously at bedtime.    . levETIRAcetam (KEPPRA) 750 MG tablet Take 750 mg by mouth 2 (two) times daily.    . Lidocaine (HM LIDOCAINE PATCH) 4 % PTCH Apply 1 patch topically every 12 (twelve) hours for 5 days. 10 patch 0  . losartan (COZAAR) 100 MG tablet Take 1 tablet (100 mg total) by mouth daily. 30 tablet 11  . meloxicam (MOBIC) 7.5 MG tablet Take 7.5 mg by mouth daily as needed for pain.     . metolazone (ZAROXOLYN) 2.5 MG tablet Take 1 tablet (2.5 mg total) by mouth daily. 1/2 hour prior to torsemide 2 tablet 0  . metoprolol tartrate (LOPRESSOR) 25 MG tablet Take 1 tablet (25 mg total)  by mouth 2 (two) times daily as needed. 60 tablet 3  . mometasone-formoterol (DULERA) 200-5 MCG/ACT AERO Inhale 2 puffs into the lungs 2 (two) times daily. 13 g 0  . montelukast (SINGULAIR) 10 MG tablet Take 10 mg by mouth daily.     . nitroGLYCERIN (NITROSTAT) 0.4 MG SL tablet DISSOLVE ONE TABLET UNDER THE TONGUE EVERY 5 MINUTES AS NEEDED FOR CHEST PAIN.  DO NOT EXCEED A TOTAL OF 3 DOSES IN 15 MINUTES (Patient taking differently: Place 0.4 mg under the tongue every 5 (five) minutes as needed for chest pain. ) 25 tablet 0  . NOVOLOG FLEXPEN 100 UNIT/ML FlexPen Inject 10 Units into the skin 3 (three) times daily with meals.    . potassium chloride SA (KLOR-CON) 20 MEQ tablet Take 2 tablets (40 mEq total) by mouth 2 (two) times daily. Take extra 2 tablets (40 meq) if you take  metolazone 140 tablet 5  . rOPINIRole (REQUIP) 1 MG tablet Take 1 mg by mouth at bedtime.     . sertraline (ZOLOFT) 100 MG tablet Take 100 mg by mouth at bedtime.    [name] tiotropium (SPIRIVA) 18 MCG inhalation capsule Place 18 mcg into inhaler and inhale daily.    [name] torsemide (DEMADEX) 20 MG tablet Take 3 tablets (60 mg total) by mouth 2 (two) times daily. 540 tablet 3  . traMADol (ULTRAM) 50 MG tablet Take 1 tablet (50 mg total) by mouth every 6 (six) hours. 40 tablet 0  . traZODone (DESYREL) 50 MG tablet Take 50 mg by mouth at bedtime.     No facility-administered medications prior to visit.    Review of Systems CNS: No confusion or sedation Cardiac: No angina or palpitations GI: No abdominal pain or constipation Constitutional: No nausea vomiting fevers or chills  Objective:  BP (!) 152/89   Pulse (!) 53   Temp (!) 97 F (36.1 C)   Resp 18   Ht 5\' 11"  (1.803 m)   Wt 267 lb (121.1 kg)   SpO2 99%   BMI 37.24 kg/m    BP Readings from Last 3 Encounters:  01/11/20 (!) 152/89  01/08/20 (!) 137/59  01/03/20 (!) 108/58     Wt Readings from Last 3 Encounters:  01/11/20 267 lb (121.1 kg)  01/08/20 267 lb (121.1 kg)  01/03/20 267 lb (121.1 kg)     Physical Exam Pt is alert and oriented PERRL EOMI HEART IS RRR no murmur or rub LCTA no wheezing or rales MUSCULOSKELETAL reveals some paraspinous muscle tenderness but no overt trigger points.  Her muscle tone and bulk is at baseline.  She has right-sided sciatica  Labs  Lab Results  Component Value Date   HGBA1C 6.5 (H) 01/18/2019   HGBA1C 7.5 (H) 05/27/2018   HGBA1C 7.7 (H) 08/16/2017   Lab Results  Component Value Date   LDLCALC 58 01/30/2017   CREATININE 1.05 (H) 09/30/2019    -------------------------------------------------------------------------------------------------------------------- Lab Results  Component Value Date   WBC 7.9 09/30/2019   HGB 8.9 (L) 09/30/2019   HCT 28.3 (L) 09/30/2019   PLT 238  09/30/2019   GLUCOSE 144 (H) 09/30/2019   CHOL 120 01/30/2017   TRIG 73 01/30/2017   HDL 47 01/30/2017   LDLCALC 58 01/30/2017   ALT 13 07/22/2019   AST 17 07/22/2019   NA 143 09/30/2019   K 3.8 09/30/2019   CL 107 09/30/2019   CREATININE 1.05 (H) 09/30/2019   BUN 14 09/30/2019   CO2 28 09/30/2019  INR 1.0 07/22/2019   HGBA1C 6.5 (H) 01/18/2019    --------------------------------------------------------------------------------------------------------------------- DG PAIN CLINIC C-ARM 1-60 MIN NO REPORT  Result Date: 01/11/2020 Fluoro was used, but no Radiologist interpretation will be provided. Please refer to "NOTES" tab for provider progress note.    Assessment & Plan:   Ernie was seen today for back pain and leg pain.  Diagnoses and all orders for this visit:  Bilateral sciatica -     Lumbar Epidural Injection; Future  Lumbar spondylosis with myelopathy -     Lumbar Epidural Injection; Future  Spinal stenosis of lumbar region with neurogenic claudication -     Lumbar Epidural Injection; Future  Chronic pain syndrome  Chronic, continuous use of opioids  DDD (degenerative disc disease), lumbar  Facet arthritis of lumbosacral region  Pain in joint of left shoulder  Primary osteoarthritis of both knees  Other orders -     triamcinolone acetonide (KENALOG-40) injection 40 mg -     sodium chloride flush (NS) 0.9 % injection 10 mL -     ropivacaine (PF) 2 mg/mL (0.2%) (NAROPIN) injection 10 mL -     midazolam (VERSED) injection 5 mg -     lidocaine (PF) (XYLOCAINE) 1 % injection 5 mL -     lactated ringers infusion 1,000 mL -     iohexol (OMNIPAQUE) 180 MG/ML injection 10 mL -     HYDROcodone-acetaminophen (NORCO) 7.5-325 MG tablet; Take 1 tablet by mouth every 6 (six) hours as needed for moderate pain or severe pain.        ----------------------------------------------------------------------------------------------------------------------  Problem  List Items Addressed This Visit    None    Visit Diagnoses    Bilateral sciatica    -  Primary   Relevant Medications   midazolam (VERSED) injection 5 mg (Completed)   Other Relevant Orders   Lumbar Epidural Injection   Lumbar spondylosis with myelopathy       Relevant Medications   triamcinolone acetonide (KENALOG-40) injection 40 mg (Completed)   HYDROcodone-acetaminophen (NORCO) 7.5-325 MG tablet (Start on 01/16/2020)   Other Relevant Orders   Lumbar Epidural Injection   Spinal stenosis of lumbar region with neurogenic claudication       Relevant Medications   triamcinolone acetonide (KENALOG-40) injection 40 mg (Completed)   ropivacaine (PF) 2 mg/mL (0.2%) (NAROPIN) injection 10 mL (Completed)   lidocaine (PF) (XYLOCAINE) 1 % injection 5 mL (Completed)   HYDROcodone-acetaminophen (NORCO) 7.5-325 MG tablet (Start on 01/16/2020)   Other Relevant Orders   Lumbar Epidural Injection   Chronic pain syndrome       Relevant Medications   triamcinolone acetonide (KENALOG-40) injection 40 mg (Completed)   ropivacaine (PF) 2 mg/mL (0.2%) (NAROPIN) injection 10 mL (Completed)   lidocaine (PF) (XYLOCAINE) 1 % injection 5 mL (Completed)   HYDROcodone-acetaminophen (NORCO) 7.5-325 MG tablet (Start on 01/16/2020)   Chronic, continuous use of opioids       DDD (degenerative disc disease), lumbar       Relevant Medications   triamcinolone acetonide (KENALOG-40) injection 40 mg (Completed)   HYDROcodone-acetaminophen (NORCO) 7.5-325 MG tablet (Start on 01/16/2020)   Facet arthritis of lumbosacral region       Relevant Medications   triamcinolone acetonide (KENALOG-40) injection 40 mg (Completed)   HYDROcodone-acetaminophen (NORCO) 7.5-325 MG tablet (Start on 01/16/2020)   Pain in joint of left shoulder       Primary osteoarthritis of both knees       Relevant Medications   triamcinolone acetonide (KENALOG-40)  injection 40 mg (Completed)   HYDROcodone-acetaminophen (NORCO) 7.5-325 MG tablet  (Start on 01/16/2020)        ----------------------------------------------------------------------------------------------------------------------  1. Bilateral sciatica Based on symptom severity I think is appropriate to proceed with a repeat epidural as she is responded to these favorably in the past and they have enabled her to be in better control of her symptoms with medication management.  We gone over the risks and benefits of the procedure with her in full detail and all questions are answered. - Lumbar Epidural Injection; Future  2. Lumbar spondylosis with myelopathy Continue core stretching strengthening exercises - Lumbar Epidural Injection; Future  3. Spinal stenosis of lumbar region with neurogenic claudication As above - Lumbar Epidural Injection; Future  4. Chronic pain syndrome I have reviewed the [facility] practitioner database information is appropriate to proceed with refills today.  5. Chronic, continuous use of opioids As above  6. DDD (degenerative disc disease), lumbar Continue efforts at weight loss and ambulation  7. Facet arthritis of lumbosacral region As above.  She is to restart her Plavix tomorrow morning and discontinue this prior to her next injection.  8. Pain in joint of left shoulder   9. Primary osteoarthritis of both knees     ----------------------------------------------------------------------------------------------------------------------  I am having Velora Heckler. Contino start on HYDROcodone-acetaminophen. I am also having her maintain her tiotropium, traZODone, albuterol, albuterol, montelukast, nitroGLYCERIN, meloxicam, levETIRAcetam, Combivent Respimat, B-D UF III MINI PEN NEEDLES, losartan, mometasone-formoterol, budesonide, torsemide, clopidogrel, Dupixent, rOPINIRole, Jardiance, NovoLOG FlexPen, sertraline, Lantus SoloStar, esomeprazole, ferrous sulfate, traMADol, carvedilol, amLODipine, isosorbide mononitrate, potassium  chloride SA, metolazone, atorvastatin, and metoprolol tartrate. We administered triamcinolone acetonide, sodium chloride flush, ropivacaine (PF) 2 mg/mL (0.2%), midazolam, lidocaine (PF), lactated ringers, and iohexol.   Meds ordered this encounter  Medications  . triamcinolone acetonide (KENALOG-40) injection 40 mg  . sodium chloride flush (NS) 0.9 % injection 10 mL  . ropivacaine (PF) 2 mg/mL (0.2%) (NAROPIN) injection 10 mL  . midazolam (VERSED) injection 5 mg  . lidocaine (PF) (XYLOCAINE) 1 % injection 5 mL  . lactated ringers infusion 1,000 mL  . iohexol (OMNIPAQUE) 180 MG/ML injection 10 mL  . HYDROcodone-acetaminophen (NORCO) 7.5-325 MG tablet    Sig: Take 1 tablet by mouth every 6 (six) hours as needed for moderate pain or severe pain.    Dispense:  100 tablet    Refill:  0   Patient's Medications  New Prescriptions   HYDROCODONE-ACETAMINOPHEN (NORCO) 7.5-325 MG TABLET    Take 1 tablet by mouth every 6 (six) hours as needed for moderate pain or severe pain.  Previous Medications   ALBUTEROL (PROVENTIL HFA;VENTOLIN HFA) 108 (90 BASE) MCG/ACT INHALER    Inhale 2 puffs into the lungs every 6 (six) hours as needed for wheezing or shortness of breath.   ALBUTEROL (PROVENTIL) (2.5 MG/3ML) 0.083% NEBULIZER SOLUTION    Take 2.5 mg every 4 (four) hours as needed by nebulization for wheezing or shortness of breath.   AMLODIPINE (NORVASC) 5 MG TABLET    Take 1 tablet (5 mg total) by mouth daily.   ATORVASTATIN (LIPITOR) 40 MG TABLET    Take 1 tablet by mouth once daily   B-D UF III MINI PEN NEEDLES 31G X 5 MM MISC    USE WITH INSULIN PEN INJECTIONS TWICE DAILY   BUDESONIDE (PULMICORT) 0.5 MG/2ML NEBULIZER SOLUTION    Take 0.5 mg by nebulization daily at 12 noon.    CARVEDILOL (COREG) 3.125 MG TABLET    Take  1 tablet (3.125 mg) by mouth once daily   CLOPIDOGREL (PLAVIX) 75 MG TABLET    Take 1 tablet (75 mg total) by mouth daily.   COMBIVENT RESPIMAT 20-100 MCG/ACT AERS RESPIMAT    Inhale 1  puff into the lungs 4 (four) times daily as needed for wheezing.    DUPILUMAB (DUPIXENT) 300 MG/2ML SOPN    Inject 300 mg into the skin every 14 (fourteen) days.    EMPAGLIFLOZIN (JARDIANCE) 10 MG TABS TABLET    Take 10 mg by mouth daily.   ESOMEPRAZOLE (NEXIUM) 40 MG CAPSULE    Take 40 mg by mouth 2 (two) times daily.   FERROUS SULFATE 325 (65 FE) MG TABLET    Take 325 mg by mouth daily.   ISOSORBIDE MONONITRATE (IMDUR) 30 MG 24 HR TABLET    Take 1 tablet (30 mg total) by mouth 2 (two) times daily.   LANTUS SOLOSTAR 100 UNIT/ML SOLOSTAR PEN    Inject 30-40 Units into the skin See admin instructions. Inject 30 units subcutaneously in the morning at 40 units subcutaneously at bedtime.   LEVETIRACETAM (KEPPRA) 750 MG TABLET    Take 750 mg by mouth 2 (two) times daily.   LOSARTAN (COZAAR) 100 MG TABLET    Take 1 tablet (100 mg total) by mouth daily.   MELOXICAM (MOBIC) 7.5 MG TABLET    Take 7.5 mg by mouth daily as needed for pain.    METOLAZONE (ZAROXOLYN) 2.5 MG TABLET    Take 1 tablet (2.5 mg total) by mouth daily. 1/2 hour prior to torsemide   METOPROLOL TARTRATE (LOPRESSOR) 25 MG TABLET    Take 1 tablet (25 mg total) by mouth 2 (two) times daily as needed.   MOMETASONE-FORMOTEROL (DULERA) 200-5 MCG/ACT AERO    Inhale 2 puffs into the lungs 2 (two) times daily.   MONTELUKAST (SINGULAIR) 10 MG TABLET    Take 10 mg by mouth daily.    NITROGLYCERIN (NITROSTAT) 0.4 MG SL TABLET    DISSOLVE ONE TABLET UNDER THE TONGUE EVERY 5 MINUTES AS NEEDED FOR CHEST PAIN.  DO NOT EXCEED A TOTAL OF 3 DOSES IN 15 MINUTES   NOVOLOG FLEXPEN 100 UNIT/ML FLEXPEN    Inject 10 Units into the skin 3 (three) times daily with meals.   POTASSIUM CHLORIDE SA (KLOR-CON) 20 MEQ TABLET    Take 2 tablets (40 mEq total) by mouth 2 (two) times daily. Take extra 2 tablets (40 meq) if you take metolazone   ROPINIROLE (REQUIP) 1 MG TABLET    Take 1 mg by mouth at bedtime.    SERTRALINE (ZOLOFT) 100 MG TABLET    Take 100 mg by mouth at  bedtime.   TIOTROPIUM (SPIRIVA) 18 MCG INHALATION CAPSULE    Place 18 mcg into inhaler and inhale daily.   TORSEMIDE (DEMADEX) 20 MG TABLET    Take 3 tablets (60 mg total) by mouth 2 (two) times daily.   TRAMADOL (ULTRAM) 50 MG TABLET    Take 1 tablet (50 mg total) by mouth every 6 (six) hours.   TRAZODONE (DESYREL) 50 MG TABLET    Take 50 mg by mouth at bedtime.  Modified Medications   No medications on file  Discontinued Medications   No medications on file   ----------------------------------------------------------------------------------------------------------------------  Follow-up: Return in about 1 month (around 02/10/2020) for evaluation, procedure.   Procedure: L5-S1 LESI with fluoroscopic guidance and with moderate sedation  NOTE: The risks, benefits, and expectations of the procedure have been discussed and  explained to the patient who was understanding and in agreement with suggested treatment plan. No guarantees were made.  DESCRIPTION OF PROCEDURE: Lumbar epidural steroid injection with  IV Versed, EKG, blood pressure, pulse, and pulse oximetry monitoring. The procedure was performed with the patient in the prone position under fluoroscopic guidance.  Sterile prep x3 was initiated and I then injected subcutaneous lidocaine to the overlying L5-S1 site after its fluoroscopic identifictation.  Using strict aseptic technique, I then advanced an 18-gauge Tuohy epidural needle in the midline using interlaminar approach via loss-of-resistance to saline technique. There was negative aspiration for heme or  CSF.  I then confirmed position with both AP and Lateral fluoroscan.  2 cc of contrast dye were injected and a  total of 5 mL of Preservative-Free normal saline mixed with 40 mg of Kenalog and 1cc Ropicaine 0.2 percent were injected incrementally via the  epidurally placed needle. The needle was removed. The patient tolerated the injection well and was convalesced and discharged to home  in stable condition. Should the patient have any post procedure difficulty they have been instructed on how to contact us for assistance.    Molli Barrows, MD Nursing Pain Medication Assessment:  Safety precautions to be maintained throughout the outpatient stay will include: orient to surroundings, keep bed in low position, maintain call bell within reach at all times, provide assistance with transfer out of bed and ambulation.  Medication Inspection Compliance: Ms. Facey did not comply with our request to bring her pills to be counted. She was reminded that bringing the medication bottles, even when empty, is a requirement.  Medication: Hydrocodone/APAP Pill/Patch Count: No pills available to be counted. Pill/Patch Appearance: did not bring meds to count Bottle Appearance: No container available. Did not bring bottle(s) to appointment. Filled Date: ? / ? / 2021 Last Medication intake:  Sunday 01/08/20

## 2020-01-11 NOTE — Patient Instructions (Addendum)
You may restart your Plavix tomorrow. A prescription for Hydrocodone has been sent to your pharmacy.  ____________________________________________________________________________________________  Post-procedure Information What to expect: Most procedures involve the use of a local anesthetic (numbing medicine), and a steroid (anti-inflammatory medicine).  The local anesthetics may cause temporary numbness and weakness of the legs or arms, depending on the location of the block. This numbness/weakness may last 4-6 hours, depending on the local anesthetic used. In rare instances, it can last up to 24 hours. While numb, you must be very careful not to injure the extremity.  After any procedure, you could expect the pain to get better within 15-20 minutes. This relief is temporary and may last 4-6 hours. Once the local anesthetics wears off, you could experience discomfort, possibly more than usual, for up to 10 (ten) days. In the case of radiofrequencies, it may last up to 6 weeks. Surgeries may take up to 8 weeks for the healing process. The discomfort is due to the irritation caused by needles going through skin and muscle. To minimize the discomfort, we recommend using ice the first day, and heat from then on. The ice should be applied for 15 minutes on, and 15 minutes off. Keep repeating this cycle until bedtime. Avoid applying the ice directly to the skin, to prevent frostbite. Heat should be used daily, until the pain improves (4-10 days). Be careful not to burn yourself.  Occasionally you may experience muscle spasms or cramps. These occur as a consequence of the irritation caused by the needle sticks to the muscle and the blood that will inevitably be lost into the surrounding muscle tissue. Blood tends to be very irritating to tissues, which tend to react by going into spasm. These spasms may start the same day of your procedure, but they may also take days to develop. This late onset type of  spasm or cramp is usually caused by electrolyte imbalances triggered by the steroids, at the level of the kidney. Cramps and spasms tend to respond well to muscle relaxants, multivitamins (some are triggered by the procedure, but may have their origins in vitamin deficiencies), and "Gatorade", or any sports drinks that can replenish any electrolyte imbalances. (If you are a diabetic, ask your pharmacist to get you a sugar-free brand.) Warm showers or baths may also be helpful. Stretching exercises are highly recommended.  General Instructions:  Be alert for signs of possible infection: redness, swelling, heat, red streaks, elevated temperature, and/or fever. These typically appear 4 to 6 days after the procedure. Immediately notify your doctor if you experience unusual bleeding, difficulty breathing, or loss of bowel or bladder control. If you experience increased pain, do not increase your pain medicine intake, unless instructed by your pain physician.  Post-Procedure Care:  Be careful in moving about. Muscle spasms in the area of the injection may occur. Applying ice or heat to the area is often helpful. The incidence of spinal headaches after epidural injections ranges between 1.4% and 6%. If you develop a headache that does not seem to respond to conservative therapy, please let your physician know. This can be treated with an epidural blood patch.   Post-procedure numbness or redness is to be expected, however it should average 4 to 6 hours. If numbness and weakness of your extremities begins to develop 4 to 6 hours after your procedure, and is felt to be progressing and worsening, immediately contact your physician.  Diet:  If you experience nausea, do not eat until this sensation goes away.  If you had a "Stellate Ganglion Block" for upper extremity "Reflex Sympathetic Dystrophy", do not eat or drink until your hoarseness goes away. In any case, always start with liquids first and if you tolerate  them well, then slowly progress to more solid foods.  Activity:  For the first 4 to 6 hours after the procedure, use caution in moving about as you may experience numbness and/or weakness. Use caution in cooking, using household electrical appliances, and climbing steps. If you need to reach your Doctor call our office: [phone] (During business hours) or [phone] (After business hours).  Business Hours: Monday-Thursday 8:00 am - 4:00 PM    Fridays: Closed     In case of an emergency: In case of emergency, call 911 or go to the nearest emergency room and have the physician there call us.  Interpretation of Procedure Every nerve block has two components: a diagnostic component, and a treatment component. Unrealistic expectations are the most common causes of "perceived failure".  In a perfect world, a single nerve block should be able to completely and permanently eliminate the pain. Sadly, the world is not perfect.  Most pain management nerve blocks are performed using local anesthetics and steroids. Steroids are responsible for any long-term benefit that you may experience. Their purpose is to decrease any chronic swelling that may exist in the area. Steroids begin to work immediately after being injected. However, most patients will not experience any benefits until 5 to 10 days after the injection, when the swelling has come down to the point where they can tell a difference. Steroids will only help if there is swelling to be treated. As such, they can assist with the diagnosis. If effective, they suggest an inflammatory component to the pain, and if ineffective, they rule out inflammation as the main cause or component of the problem. If the problem is one of mechanical compression, you will get no benefit from those steroids.   In the case of local anesthetics, they have a crucial role in the diagnosis of your condition. Most will begin to work within15 to 20 minutes after  injection. The duration will depend on the type used (short- vs. Long-acting). It is of outmost importance that patients keep tract of their pain, after the procedure. To assist with this matter, a "Post-procedure Pain Diary" is provided. Make sure to complete it and to bring it back to your follow-up appointment.  As long as the patient keeps accurate, detailed records of their symptoms after every procedure, and returns to have those interpreted, every procedure will provide [place] with invaluable information. Even a block that does not provide the patient with any relief, will always provide [place] with information about the mechanism and the origin of the pain. The only time a nerve block can be considered a waste of time is when patients do not keep track of the results, or do not keep their post-procedure appointment.  Reporting the results back to your physician The Pain Score  Pain is a subjective complaint. It cannot be seen, touched, or measured. We depend entirely on the patient's report of the pain in order to assess your condition and treatment. To evaluate the pain, we use a pain scale, where "0" means "No Pain", and a "10" is "the worst possible pain that you can even imagine" (i.e. something like been eaten alive by a shark or being torn apart by a lion).   Use the Pain Scale provided. You will frequently be  asked to rate your pain. Please be accurate, remember that medical decisions will be based on your responses. Please do not rate your pain above a 10. Doing so is actually interpreted as "symptom magnification" (exaggeration). To put this into perspective, when you tell us that your pain is at a 10 (ten), what you are saying is that there is nothing we can do to make this pain any worse. (Carefully think about that.) ____________________________________________________________________________________________   Post-procedure Information What to expect: Most procedures involve the use of a  local anesthetic (numbing medicine), and a steroid (anti-inflammatory medicine).  The local anesthetics may cause temporary numbness and weakness of the legs or arms, depending on the location of the block. This numbness/weakness may last 4-6 hours, depending on the local anesthetic used. In rare instances, it can last up to 24 hours. While numb, you must be very careful not to injure the extremity.  After any procedure, you could expect the pain to get better within 15-20 minutes. This relief is temporary and may last 4-6 hours. Once the local anesthetics wears off, you could experience discomfort, possibly more than usual, for up to 10 (ten) days. In the case of radiofrequencies, it may last up to 6 weeks. Surgeries may take up to 8 weeks for the healing process. The discomfort is due to the irritation caused by needles going through skin and muscle. To minimize the discomfort, we recommend using ice the first day, and heat from then on. The ice should be applied for 15 minutes on, and 15 minutes off. Keep repeating this cycle until bedtime. Avoid applying the ice directly to the skin, to prevent frostbite. Heat should be used daily, until the pain improves (4-10 days). Be careful not to burn yourself.  Occasionally you may experience muscle spasms or cramps. These occur as a consequence of the irritation caused by the needle sticks to the muscle and the blood that will inevitably be lost into the surrounding muscle tissue. Blood tends to be very irritating to tissues, which tend to react by going into spasm. These spasms may start the same day of your procedure, but they may also take days to develop. This late onset type of spasm or cramp is usually caused by electrolyte imbalances triggered by the steroids, at the level of the kidney. Cramps and spasms tend to respond well to muscle relaxants, multivitamins (some are triggered by the procedure, but may have their origins in vitamin deficiencies), and  "Gatorade", or any sports drinks that can replenish any electrolyte imbalances. (If you are a diabetic, ask your pharmacist to get you a sugar-free brand.) Warm showers or baths may also be helpful. Stretching exercises are highly recommended. General Instructions:  Be alert for signs of possible infection: redness, swelling, heat, red streaks, elevated temperature, and/or fever. These typically appear 4 to 6 days after the procedure. Immediately notify your doctor if you experience unusual bleeding, difficulty breathing, or loss of bowel or bladder control. If you experience increased pain, do not increase your pain medicine intake, unless instructed by your pain physician. Post-Procedure Care:  Be careful in moving about. Muscle spasms in the area of the injection may occur. Applying ice or heat to the area is often helpful. The incidence of spinal headaches after epidural injections ranges between 1.4% and 6%. If you develop a headache that does not seem to respond to conservative therapy, please let your physician know. This can be treated with an epidural blood patch.   Post-procedure numbness  or redness is to be expected, however it should average 4 to 6 hours. If numbness and weakness of your extremities begins to develop 4 to 6 hours after your procedure, and is felt to be progressing and worsening, immediately contact your physician.   Diet:  If you experience nausea, do not eat until this sensation goes away. If you had a "Stellate Ganglion Block" for upper extremity "Reflex Sympathetic Dystrophy", do not eat or drink until your hoarseness goes away. In any case, always start with liquids first and if you tolerate them well, then slowly progress to more solid foods. Activity:  For the first 4 to 6 hours after the procedure, use caution in moving about as you may experience numbness and/or weakness. Use caution in cooking, using household electrical appliances, and climbing steps. If you need to  reach your Doctor call our office: [phone]) [phone] Monday-Thursday 8:00 am - 4:00 PM    Fridays: Closed     In case of an emergency: In case of emergency, call 911 or go to the nearest emergency room and have the physician there call us.  Interpretation of Procedure Every nerve block has two components: a diagnostic component, and a treatment component. Unrealistic expectations are the most common causes of "perceived failure".  In a perfect world, a single nerve block should be able to completely and permanently eliminate the pain. Sadly, the world is not perfect.  Most pain management nerve blocks are performed using local anesthetics and steroids. Steroids are responsible for any long-term benefit that you may experience. Their purpose is to decrease any chronic swelling that may exist in the area. Steroids begin to work immediately after being injected. However, most patients will not experience any benefits until 5 to 10 days after the injection, when the swelling has come down to the point where they can tell a difference. Steroids will only help if there is swelling to be treated. As such, they can assist with the diagnosis. If effective, they suggest an inflammatory component to the pain, and if ineffective, they rule out inflammation as the main cause or component of the problem. If the problem is one of mechanical compression, you will get no benefit from those steroids.   In the case of local anesthetics, they have a crucial role in the diagnosis of your condition. Most will begin to work within15 to 20 minutes after injection. The duration will depend on the type used (short- vs. Long-acting). It is of outmost importance that patients keep tract of their pain, after the procedure. To assist with this matter, a "Post-procedure Pain Diary" is provided. Make sure to complete it and to bring it back to your follow-up appointment.  As long as the patient keeps accurate, detailed records of their  symptoms after every procedure, and returns to have those interpreted, every procedure will provide [place] with invaluable information. Even a block that does not provide the patient with any relief, will always provide [place] with information about the mechanism and the origin of the pain. The only time a nerve block can be considered a waste of time is when patients do not keep track of the results, or do not keep their post-procedure appointment.  Reporting the results back to your physician The Pain Score  Pain is a subjective complaint. It cannot be seen, touched, or measured. We depend entirely on the patient's report of the pain in order to assess your condition and treatment. To evaluate the pain, we use a pain  scale, where "0" means "No Pain", and a "10" is "the worst possible pain that you can even imagine" (i.e. something like been eaten alive by a shark or being torn apart by a lion).   You will frequently be asked to rate your pain. Please be as accurate, remember that medical decisions will be based on your responses. Please do not rate your pain above a 10. Doing so is actually interpreted as "symptom magnification" (exaggeration), as well as lack of understanding with regards to the scale. To put this into perspective, when you tell us that your pain is at a 10 (ten), what you are saying is that there is nothing we can do to make this pain any worse. (Carefully think about that.)

## 2020-01-13 ENCOUNTER — Telehealth: Payer: Self-pay | Admitting: Cardiovascular Disease

## 2020-01-13 NOTE — Telephone Encounter (Addendum)
Returned the patients call. No voice mail available or home of mobile phone. Mobile line stats that the voicemail is not set up.

## 2020-01-13 NOTE — Telephone Encounter (Signed)
STAT if HR is under 50 or over 120 (normal HR is 60-100 beats per minute)  1) What is your heart rate? N/a  2) Do you have a log of your heart rate readings (document readings)?  54 in office on 9/7      46 at pain doctor on 9/15  3) Do you have any other symptoms?SOB,  Hard to breathe while sleeping, states it was like acid reflux but not reflux

## 2020-01-16 ENCOUNTER — Other Ambulatory Visit: Payer: Self-pay | Admitting: *Deleted

## 2020-01-16 MED ORDER — ATORVASTATIN CALCIUM 40 MG PO TABS
40.0000 mg | ORAL_TABLET | Freq: Every day | ORAL | 1 refills | Status: DC
Start: 2020-01-16 — End: 2020-12-25

## 2020-01-16 NOTE — Telephone Encounter (Signed)
Received fax request for 90 days supply for Atorvastatin. Rx was originally sent in for #30 but patient is requesting #90. New prescription for #90 and 1 refill sent to pharmacy per request.

## 2020-01-17 NOTE — Telephone Encounter (Signed)
No answer. Left message to call back on mobile number listed. No answer or VM on home number listed.

## 2020-01-18 NOTE — Telephone Encounter (Signed)
Left voicemail message to call back  

## 2020-02-06 ENCOUNTER — Telehealth: Payer: Self-pay

## 2020-02-06 NOTE — Telephone Encounter (Signed)
I still have not been able to process the prior authorization request for this patient from 01/11/20 because there are no signed doctors clinical notes for that visit. Notified Jocelyn Lamer.

## 2020-02-10 ENCOUNTER — Telehealth: Payer: Self-pay | Admitting: Cardiovascular Disease

## 2020-02-10 ENCOUNTER — Telehealth: Payer: Self-pay

## 2020-02-10 ENCOUNTER — Other Ambulatory Visit: Payer: Self-pay

## 2020-02-10 MED ORDER — CARVEDILOL 3.125 MG PO TABS: 3.1250 mg | ORAL_TABLET | Freq: Every day | ORAL | 3 refills | Status: DC

## 2020-02-10 NOTE — Telephone Encounter (Signed)
Spoke with patient and she states that she ran out of her medication carvedilol and has been short of breath. She states that once she gets her medication she should be fine. Reviewed that refill was sent in today and she should be able to pick it up. Reviewed signs and symptoms for which she would need to go to ED for further care and we also have someone on call. She verbalized understanding of our conversation with no further questions at this time.

## 2020-02-10 NOTE — Telephone Encounter (Signed)
Please call regarding medication refill. Patient is not sure the name of the medication she needs refilled, states "it starts with an "A".

## 2020-02-10 NOTE — Telephone Encounter (Signed)
*  STAT* If patient is at the pharmacy, call can be transferred to refill team.   1. Which medications need to be refilled? (please list name of each medication and dose if known) carvedilol  3.125 mg po q d   2. Which pharmacy/location (including street and city if local pharmacy) is medication to be sent to? [facility] [address] [place]  3. Do they need a 30 day or 90 day supply? [place]

## 2020-02-10 NOTE — Telephone Encounter (Signed)
Pt c/o Shortness Of Breath: STAT if SOB developed within the last 24 hours or pt is noticeably SOB on the phone  1. Are you currently SOB (can you hear that pt is SOB on the phone)?  Yes a little not audible   2. How long have you been experiencing SOB? 1 week since running out of carvedilol   3. Are you SOB when sitting or when up moving around? Exertion and laying down in bed   4. Are you currently experiencing any other symptoms? No

## 2020-02-15 ENCOUNTER — Encounter: Payer: Self-pay | Admitting: Anesthesiology

## 2020-02-15 ENCOUNTER — Ambulatory Visit: Payer: Medicare HMO | Attending: Anesthesiology | Admitting: Anesthesiology

## 2020-02-15 ENCOUNTER — Other Ambulatory Visit: Payer: Self-pay

## 2020-02-15 DIAGNOSIS — M5431 Sciatica, right side: Secondary | ICD-10-CM | POA: Diagnosis not present

## 2020-02-15 DIAGNOSIS — M4716 Other spondylosis with myelopathy, lumbar region: Secondary | ICD-10-CM | POA: Diagnosis not present

## 2020-02-15 DIAGNOSIS — M5432 Sciatica, left side: Secondary | ICD-10-CM

## 2020-02-15 DIAGNOSIS — M48062 Spinal stenosis, lumbar region with neurogenic claudication: Secondary | ICD-10-CM

## 2020-02-15 DIAGNOSIS — M25512 Pain in left shoulder: Secondary | ICD-10-CM

## 2020-02-15 DIAGNOSIS — G894 Chronic pain syndrome: Secondary | ICD-10-CM | POA: Diagnosis not present

## 2020-02-15 DIAGNOSIS — M47817 Spondylosis without myelopathy or radiculopathy, lumbosacral region: Secondary | ICD-10-CM

## 2020-02-15 DIAGNOSIS — M51369 Other intervertebral disc degeneration, lumbar region without mention of lumbar back pain or lower extremity pain: Secondary | ICD-10-CM

## 2020-02-15 DIAGNOSIS — F1190 Opioid use, unspecified, uncomplicated: Secondary | ICD-10-CM

## 2020-02-15 DIAGNOSIS — M5136 Other intervertebral disc degeneration, lumbar region: Secondary | ICD-10-CM

## 2020-02-15 MED ORDER — HYDROCODONE-ACETAMINOPHEN 7.5-325 MG PO TABS
1.0000 | ORAL_TABLET | Freq: Four times a day (QID) | ORAL | 0 refills | Status: AC | PRN
Start: 2020-02-15 — End: 2020-03-16

## 2020-02-15 MED ORDER — HYDROCODONE-ACETAMINOPHEN 7.5-325 MG PO TABS
1.0000 | ORAL_TABLET | Freq: Four times a day (QID) | ORAL | 0 refills | Status: DC | PRN
Start: 2020-03-16 — End: 2020-03-20

## 2020-02-15 NOTE — Progress Notes (Signed)
Virtual Visit via Telephone Note  I connected with Tina Hayes on 02/15/20 at  2:35 PM EDT by telephone and verified that I am speaking with the correct person using two identifiers.  Location: Patient: Home  Provider: [facility]   I discussed the limitations, risks, security and privacy concerns of performing an evaluation and management service by telephone and the availability of in person appointments. I also discussed with the patient that there may be a patient responsible charge related to this service. The patient expressed understanding and agreed to proceed.   History of Present Illness: I spoke with Tina Hayes regarding her low back pain and lower extremity pain today via telephone as she was unable to do the video portion of the conference.  She did well with her last epidural getting approximately 4 to 5 weeks of 75% improvement and over the last week or so she is getting some recurrence of that pain.  She takes her opioid medications approximately 3-4 times a day and this does keep her recalcitrant lower back pain and leg pain under decent control.  She generally gets about 70% relief with the medication for about 4 hours before she gets recurrence of the same pain.  The pain has been stable in nature and is worse when she is up and about with prolonged standing and activity and the quality characteristic is remained stable as well.  She does respond to epidural steroid injections and she reports approximately 75 to 80% relief lasting about 2 months on average.  She is had earlier recurrence with her most recent epidural.  No other changes in strength or function or bowel bladder function are noted at this time.   Observations/Objective:   Current Outpatient Medications:  .  albuterol (PROVENTIL HFA;VENTOLIN HFA) 108 (90 Base) MCG/ACT inhaler, Inhale 2 puffs into the lungs every 6 (six) hours as needed for wheezing or shortness of breath., Disp: 1 Inhaler, Rfl: 2 .   albuterol (PROVENTIL) (2.5 MG/3ML) 0.083% nebulizer solution, Take 2.5 mg every 4 (four) hours as needed by nebulization for wheezing or shortness of breath., Disp: , Rfl:  .  amLODipine (NORVASC) 5 MG tablet, Take 1 tablet (5 mg total) by mouth daily., Disp: 30 tablet, Rfl: 6 .  atorvastatin (LIPITOR) 40 MG tablet, Take 1 tablet (40 mg total) by mouth daily., Disp: 90 tablet, Rfl: 1 .  B-D UF III MINI PEN NEEDLES 31G X 5 MM MISC, USE WITH INSULIN PEN INJECTIONS TWICE DAILY, Disp: , Rfl:  .  budesonide (PULMICORT) 0.5 MG/2ML nebulizer solution, Take 0.5 mg by nebulization daily at 12 noon. , Disp: , Rfl:  .  carvedilol (COREG) 3.125 MG tablet, Take 1 tablet (3.125 mg total) by mouth daily. Take 1 tablet (3.125 mg) by mouth once daily, Disp: 90 tablet, Rfl: 3 .  clopidogrel (PLAVIX) 75 MG tablet, Take 1 tablet (75 mg total) by mouth daily., Disp: 90 tablet, Rfl: 3 .  COMBIVENT RESPIMAT 20-100 MCG/ACT AERS respimat, Inhale 1 puff into the lungs 4 (four) times daily as needed for wheezing. , Disp: , Rfl:  .  Dupilumab (DUPIXENT) 300 MG/2ML SOPN, Inject 300 mg into the skin every 14 (fourteen) days. , Disp: , Rfl:  .  empagliflozin (JARDIANCE) 10 MG TABS tablet, Take 10 mg by mouth daily., Disp: , Rfl:  .  esomeprazole (NEXIUM) 40 MG capsule, Take 40 mg by mouth 2 (two) times daily., Disp: , Rfl:  .  ferrous sulfate 325 (65 FE) MG  tablet, Take 325 mg by mouth daily., Disp: , Rfl:  .  HYDROcodone-acetaminophen (NORCO) 7.5-325 MG tablet, Take 1 tablet by mouth every 6 (six) hours as needed for moderate pain or severe pain., Disp: 100 tablet, Rfl: 0 .  [START ON 03/16/2020] HYDROcodone-acetaminophen (NORCO) 7.5-325 MG tablet, Take 1 tablet by mouth every 6 (six) hours as needed for moderate pain or severe pain., Disp: 100 tablet, Rfl: 0 .  isosorbide mononitrate (IMDUR) 30 MG 24 hr tablet, Take 1 tablet (30 mg total) by mouth 2 (two) times daily., Disp: 60 tablet, Rfl: 3 .  LANTUS SOLOSTAR 100 UNIT/ML  Solostar Pen, Inject 30-40 Units into the skin See admin instructions. Inject 30 units subcutaneously in the morning at 40 units subcutaneously at bedtime., Disp: , Rfl:  .  levETIRAcetam (KEPPRA) 750 MG tablet, Take 750 mg by mouth 2 (two) times daily., Disp: , Rfl:  .  losartan (COZAAR) 100 MG tablet, Take 1 tablet (100 mg total) by mouth daily., Disp: 30 tablet, Rfl: 11 .  meloxicam (MOBIC) 7.5 MG tablet, Take 7.5 mg by mouth daily as needed for pain. , Disp: , Rfl:  .  metolazone (ZAROXOLYN) 2.5 MG tablet, Take 1 tablet (2.5 mg total) by mouth daily. 1/2 hour prior to torsemide, Disp: 2 tablet, Rfl: 0 .  metoprolol tartrate (LOPRESSOR) 25 MG tablet, Take 1 tablet (25 mg total) by mouth 2 (two) times daily as needed., Disp: 60 tablet, Rfl: 3 .  mometasone-formoterol (DULERA) 200-5 MCG/ACT AERO, Inhale 2 puffs into the lungs 2 (two) times daily., Disp: 13 g, Rfl: 0 .  montelukast (SINGULAIR) 10 MG tablet, Take 10 mg by mouth daily. , Disp: , Rfl:  .  nitroGLYCERIN (NITROSTAT) 0.4 MG SL tablet, DISSOLVE ONE TABLET UNDER THE TONGUE EVERY 5 MINUTES AS NEEDED FOR CHEST PAIN.  DO NOT EXCEED A TOTAL OF 3 DOSES IN 15 MINUTES (Patient taking differently: Place 0.4 mg under the tongue every 5 (five) minutes as needed for chest pain. ), Disp: 25 tablet, Rfl: 0 .  NOVOLOG FLEXPEN 100 UNIT/ML FlexPen, Inject 10 Units into the skin 3 (three) times daily with meals., Disp: , Rfl:  .  potassium chloride SA (KLOR-CON) 20 MEQ tablet, Take 2 tablets (40 mEq total) by mouth 2 (two) times daily. Take extra 2 tablets (40 meq) if you take metolazone, Disp: 140 tablet, Rfl: 5 .  rOPINIRole (REQUIP) 1 MG tablet, Take 1 mg by mouth at bedtime. , Disp: , Rfl:  .  sertraline (ZOLOFT) 100 MG tablet, Take 100 mg by mouth at bedtime., Disp: , Rfl:  .  tiotropium (SPIRIVA) 18 MCG inhalation capsule, Place 18 mcg into inhaler and inhale daily., Disp: , Rfl:  .  torsemide (DEMADEX) 20 MG tablet, Take 3 tablets (60 mg total) by mouth  2 (two) times daily., Disp: 540 tablet, Rfl: 3 .  traMADol (ULTRAM) 50 MG tablet, Take 1 tablet (50 mg total) by mouth every 6 (six) hours., Disp: 40 tablet, Rfl: 0 .  traZODone (DESYREL) 50 MG tablet, Take 50 mg by mouth at bedtime., Disp: , Rfl:  Assessment and Plan: 1. Bilateral sciatica   2. Lumbar spondylosis with myelopathy   3. Spinal stenosis of lumbar region with neurogenic claudication   4. Chronic pain syndrome   5. Chronic, continuous use of opioids   6. DDD (degenerative disc disease), lumbar   7. Facet arthritis of lumbosacral region   8. Pain in joint of left shoulder   Based on our discussion  today and upon review of the [facility] practitioner database information I think it is appropriate to refill her medicines for the next 2 months dated for today and November 19.  She is scheduled for an epidural here within the next few weeks.  No other changes will be initiated in her pain management protocol or medication management.  Fortunately she does respond favorably to the epidurals and has had good relief with her opioid medications.  She has failed more conservative therapy.  I encouraged her to continue with efforts at weight loss and lower extremity core strengthening.  These are significant issues for her.  Otherwise follow-up with her primary care physicians and contact us if she has any other issues with her pain management.  Follow Up Instructions:    I discussed the assessment and treatment plan with the patient. The patient was provided an opportunity to ask questions and all were answered. The patient agreed with the plan and demonstrated an understanding of the instructions.   The patient was advised to call back or seek an in-person evaluation if the symptoms worsen or if the condition fails to improve as anticipated.  I provided 25 minutes of non-face-to-face time during this encounter.   Molli Barrows, MD

## 2020-02-22 ENCOUNTER — Other Ambulatory Visit: Payer: Self-pay

## 2020-02-22 MED ORDER — CARVEDILOL 3.125 MG PO TABS: 3.1250 mg | ORAL_TABLET | Freq: Every day | ORAL | 0 refills | Status: DC

## 2020-02-22 NOTE — Telephone Encounter (Signed)
*  STAT* If patient is at the pharmacy, call can be transferred to refill team.   1. Which medications need to be refilled? (please list name of each medication and dose if known) Carvedilol  2. Which pharmacy/location (including street and city if local pharmacy) is medication to be sent to? [place]  3. Do they need a 30 day or 90 day supply? [place]

## 2020-02-22 NOTE — Telephone Encounter (Signed)
Requested Prescriptions   Signed Prescriptions Disp Refills  . carvedilol (COREG) 3.125 MG tablet 90 tablet 0    Sig: Take 1 tablet (3.125 mg total) by mouth daily. Take 1 tablet (3.125 mg) by mouth once daily    Authorizing Provider: Minna Merritts    Ordering User: Britt Bottom

## 2020-03-20 ENCOUNTER — Encounter: Payer: Self-pay | Admitting: Anesthesiology

## 2020-03-20 ENCOUNTER — Other Ambulatory Visit: Payer: Self-pay | Admitting: Anesthesiology

## 2020-03-20 ENCOUNTER — Other Ambulatory Visit: Payer: Self-pay

## 2020-03-20 ENCOUNTER — Ambulatory Visit
Admission: RE | Admit: 2020-03-20 | Discharge: 2020-03-20 | Disposition: A | Discharge status: Home / Self Care | Payer: Medicare HMO | Source: Ambulatory Visit | Attending: Anesthesiology | Admitting: Anesthesiology

## 2020-03-20 ENCOUNTER — Ambulatory Visit (HOSPITAL_BASED_OUTPATIENT_CLINIC_OR_DEPARTMENT_OTHER): Payer: Medicare HMO | Admitting: Anesthesiology

## 2020-03-20 VITALS — BP 139/72 | HR 54 | Temp 97.3°F | Resp 16 | Ht 71.0 in | Wt 269.0 lb

## 2020-03-20 DIAGNOSIS — M5431 Sciatica, right side: Secondary | ICD-10-CM

## 2020-03-20 DIAGNOSIS — F1190 Opioid use, unspecified, uncomplicated: Secondary | ICD-10-CM | POA: Insufficient documentation

## 2020-03-20 DIAGNOSIS — R52 Pain, unspecified: Secondary | ICD-10-CM

## 2020-03-20 DIAGNOSIS — G894 Chronic pain syndrome: Secondary | ICD-10-CM

## 2020-03-20 DIAGNOSIS — M25512 Pain in left shoulder: Secondary | ICD-10-CM | POA: Insufficient documentation

## 2020-03-20 DIAGNOSIS — M170 Bilateral primary osteoarthritis of knee: Secondary | ICD-10-CM

## 2020-03-20 DIAGNOSIS — M4716 Other spondylosis with myelopathy, lumbar region: Secondary | ICD-10-CM | POA: Insufficient documentation

## 2020-03-20 DIAGNOSIS — M48062 Spinal stenosis, lumbar region with neurogenic claudication: Secondary | ICD-10-CM | POA: Diagnosis present

## 2020-03-20 DIAGNOSIS — M5432 Sciatica, left side: Secondary | ICD-10-CM | POA: Insufficient documentation

## 2020-03-20 DIAGNOSIS — M5136 Other intervertebral disc degeneration, lumbar region: Secondary | ICD-10-CM | POA: Insufficient documentation

## 2020-03-20 MED ORDER — HYDROCODONE-ACETAMINOPHEN 7.5-325 MG PO TABS: 1.0000 | ORAL_TABLET | Freq: Four times a day (QID) | ORAL | 0 refills | Status: DC | PRN

## 2020-03-20 MED ORDER — TRIAMCINOLONE ACETONIDE 40 MG/ML IJ SUSP
40.0000 mg | Freq: Once | INTRAMUSCULAR | Status: AC
  Administered 2020-03-20: 40 mg
  Filled 2020-03-20: qty 1

## 2020-03-20 MED ORDER — LIDOCAINE HCL (PF) 1 % IJ SOLN
5.0000 mL | Freq: Once | INTRAMUSCULAR | Status: AC
  Administered 2020-03-20: 5 mL via SUBCUTANEOUS
  Filled 2020-03-20: qty 5

## 2020-03-20 MED ORDER — ROPIVACAINE HCL 2 MG/ML IJ SOLN
10.0000 mL | Freq: Once | INTRAMUSCULAR | Status: AC
  Administered 2020-03-20: 1 mL via EPIDURAL
  Filled 2020-03-20: qty 10

## 2020-03-20 MED ORDER — IOHEXOL 180 MG/ML  SOLN
10.0000 mL | Freq: Once | INTRAMUSCULAR | Status: AC | PRN
  Administered 2020-03-20: 10 mL via EPIDURAL

## 2020-03-20 MED ORDER — HYDROCODONE-ACETAMINOPHEN 7.5-325 MG PO TABS
1.0000 | ORAL_TABLET | Freq: Four times a day (QID) | ORAL | 0 refills | Status: DC | PRN
Start: 2020-04-15 — End: 2020-05-15

## 2020-03-20 MED ORDER — SODIUM CHLORIDE 0.9% FLUSH
10.0000 mL | Freq: Once | INTRAVENOUS | Status: AC
  Administered 2020-03-20: 10 mL

## 2020-03-20 MED ORDER — MIDAZOLAM HCL 2 MG/2ML IJ SOLN
5.0000 mg | Freq: Once | INTRAMUSCULAR | Status: AC
  Administered 2020-03-20: 2 mg via INTRAVENOUS

## 2020-03-20 MED ORDER — LACTATED RINGERS IV SOLN
1000.0000 mL | INTRAVENOUS | Status: DC
  Administered 2020-03-20: 1000 mL via INTRAVENOUS

## 2020-03-20 MED ORDER — MIDAZOLAM HCL 5 MG/5ML IJ SOLN
INTRAMUSCULAR | Status: AC
  Filled 2020-03-20: qty 5

## 2020-03-20 NOTE — Progress Notes (Signed)
Safety precautions to be maintained throughout the outpatient stay will include: orient to surroundings, keep bed in low position, maintain call bell within reach at all times, provide assistance with transfer out of bed and ambulation.   patient did not bring medications today.

## 2020-03-20 NOTE — Patient Instructions (Signed)

## 2020-03-21 ENCOUNTER — Telehealth: Payer: Self-pay

## 2020-03-21 NOTE — Progress Notes (Signed)
Subjective:  Patient ID: Tina Hayes, female    DOB: Jan 12, 1946  Age: 74 y.o. MRN: [id number]  CC: Back Pain (lower)   Procedure: L5-S1 epidural steroid under fluoroscopic guidance with moderate sedation  HPI Tina Hayes presents for reevaluation.  She continues to have bilateral lower extremity sciatica symptoms with calf cramping and associated numbness and tingling worse with prolonged standing and activity.  She has had previous epidural injections in the past and responded favorably to these.  She generally gets approximately 75% relief lasting 2 months.  She continues to be primarily recumbent utilizing a wheelchair or scooter for mobility.  No change in bowel or bladder function or lower extremity strength or function is noted at this time.  Otherwise she is stable without change from her previous evaluation.  Outpatient Medications Prior to Visit  Medication Sig Dispense Refill  . albuterol (PROVENTIL HFA;VENTOLIN HFA) 108 (90 Base) MCG/ACT inhaler Inhale 2 puffs into the lungs every 6 (six) hours as needed for wheezing or shortness of breath. 1 Inhaler 2  . albuterol (PROVENTIL) (2.5 MG/3ML) 0.083% nebulizer solution Take 2.5 mg every 4 (four) hours as needed by nebulization for wheezing or shortness of breath.    [name] atorvastatin (LIPITOR) 40 MG tablet Take 1 tablet (40 mg total) by mouth daily. 90 tablet 1  . B-D UF III MINI PEN NEEDLES 31G X 5 MM MISC USE WITH INSULIN PEN INJECTIONS TWICE DAILY    . budesonide (PULMICORT) 0.5 MG/2ML nebulizer solution Take 0.5 mg by nebulization daily at 12 noon.     . carvedilol (COREG) 3.125 MG tablet Take 1 tablet (3.125 mg total) by mouth daily. Take 1 tablet (3.125 mg) by mouth once daily 90 tablet 0  . clopidogrel (PLAVIX) 75 MG tablet Take 1 tablet (75 mg total) by mouth daily. 90 tablet 3  . COMBIVENT RESPIMAT 20-100 MCG/ACT AERS respimat Inhale 1 puff into the lungs 4 (four) times daily as needed for wheezing.     . Dupilumab (DUPIXENT)  300 MG/2ML SOPN Inject 300 mg into the skin every 14 (fourteen) days.     . empagliflozin (JARDIANCE) 10 MG TABS tablet Take 10 mg by mouth daily.    [name] esomeprazole (NEXIUM) 40 MG capsule Take 40 mg by mouth 2 (two) times daily.    . ferrous sulfate 325 (65 FE) MG tablet Take 325 mg by mouth daily.    . isosorbide mononitrate (IMDUR) 30 MG 24 hr tablet Take 1 tablet (30 mg total) by mouth 2 (two) times daily. 60 tablet 3  . LANTUS SOLOSTAR 100 UNIT/ML Solostar Pen Inject 30-40 Units into the skin See admin instructions. Inject 30 units subcutaneously in the morning at 40 units subcutaneously at bedtime.    . levETIRAcetam (KEPPRA) 750 MG tablet Take 750 mg by mouth 2 (two) times daily.    [name] losartan (COZAAR) 100 MG tablet Take 1 tablet (100 mg total) by mouth daily. 30 tablet 11  . meloxicam (MOBIC) 7.5 MG tablet Take 7.5 mg by mouth daily as needed for pain.     . metolazone (ZAROXOLYN) 2.5 MG tablet Take 1 tablet (2.5 mg total) by mouth daily. 1/2 hour prior to torsemide 2 tablet 0  . metoprolol tartrate (LOPRESSOR) 25 MG tablet Take 1 tablet (25 mg total) by mouth 2 (two) times daily as needed. 60 tablet 3  . mometasone-formoterol (DULERA) 200-5 MCG/ACT AERO Inhale 2 puffs into the lungs 2 (two) times daily. 13 g 0  . montelukast (  SINGULAIR) 10 MG tablet Take 10 mg by mouth daily.     . nitroGLYCERIN (NITROSTAT) 0.4 MG SL tablet DISSOLVE ONE TABLET UNDER THE TONGUE EVERY 5 MINUTES AS NEEDED FOR CHEST PAIN.  DO NOT EXCEED A TOTAL OF 3 DOSES IN 15 MINUTES (Patient taking differently: Place 0.4 mg under the tongue every 5 (five) minutes as needed for chest pain. ) 25 tablet 0  . NOVOLOG FLEXPEN 100 UNIT/ML FlexPen Inject 10 Units into the skin 3 (three) times daily with meals.    . potassium chloride SA (KLOR-CON) 20 MEQ tablet Take 2 tablets (40 mEq total) by mouth 2 (two) times daily. Take extra 2 tablets (40 meq) if you take metolazone 140 tablet 5  . rOPINIRole (REQUIP) 1 MG tablet Take 1 mg by  mouth at bedtime.     . sertraline (ZOLOFT) 100 MG tablet Take 100 mg by mouth at bedtime.    [name] tiotropium (SPIRIVA) 18 MCG inhalation capsule Place 18 mcg into inhaler and inhale daily.    [name] torsemide (DEMADEX) 20 MG tablet Take 3 tablets (60 mg total) by mouth 2 (two) times daily. 540 tablet 3  . traMADol (ULTRAM) 50 MG tablet Take 1 tablet (50 mg total) by mouth every 6 (six) hours. 40 tablet 0  . traZODone (DESYREL) 50 MG tablet Take 50 mg by mouth at bedtime.    [name] HYDROcodone-acetaminophen (NORCO) 7.5-325 MG tablet Take 1 tablet by mouth every 6 (six) hours as needed for moderate pain or severe pain. 100 tablet 0  . amLODipine (NORVASC) 5 MG tablet Take 1 tablet (5 mg total) by mouth daily. 30 tablet 6   No facility-administered medications prior to visit.    Review of Systems CNS: No confusion or sedation Cardiac: No angina or palpitations GI: No abdominal pain or constipation Constitutional: No nausea vomiting fevers or chills  Objective:  BP 139/72   Pulse (!) 54   Temp (!) 97.3 F (36.3 C)   Resp 16   Ht 5\' 11"  (1.803 m)   Wt 269 lb (122 kg)   SpO2 97%   BMI 37.52 kg/m    BP Readings from Last 3 Encounters:  03/20/20 139/72  01/11/20 (!) 152/89  01/08/20 (!) 137/59     Wt Readings from Last 3 Encounters:  03/20/20 269 lb (122 kg)  01/11/20 267 lb (121.1 kg)  01/08/20 267 lb (121.1 kg)     Physical Exam Pt is alert and oriented PERRL EOMI HEART IS RRR no murmur or rub LCTA no wheezing or rales MUSCULOSKELETAL reveals some paraspinous muscle tenderness and a seated positive straight leg raise right side and more equivocal on the left.  No change in lower extremity strength or function is noted.  Muscle tone and bulk is at baseline.  Labs  Lab Results  Component Value Date   HGBA1C 6.5 (H) 01/18/2019   HGBA1C 7.5 (H) 05/27/2018   HGBA1C 7.7 (H) 08/16/2017   Lab Results  Component Value Date   LDLCALC 58 01/30/2017   CREATININE 1.05 (H) 09/30/2019     -------------------------------------------------------------------------------------------------------------------- Lab Results  Component Value Date   WBC 7.9 09/30/2019   HGB 8.9 (L) 09/30/2019   HCT 28.3 (L) 09/30/2019   PLT 238 09/30/2019   GLUCOSE 144 (H) 09/30/2019   CHOL 120 01/30/2017   TRIG 73 01/30/2017   HDL 47 01/30/2017   LDLCALC 58 01/30/2017   ALT 13 07/22/2019   AST 17 07/22/2019   NA 143 09/30/2019   K  3.8 09/30/2019   CL 107 09/30/2019   CREATININE 1.05 (H) 09/30/2019   BUN 14 09/30/2019   CO2 28 09/30/2019   INR 1.0 07/22/2019   HGBA1C 6.5 (H) 01/18/2019    --------------------------------------------------------------------------------------------------------------------- DG PAIN CLINIC C-ARM 1-60 MIN NO REPORT  Result Date: 03/20/2020 Fluoro was used, but no Radiologist interpretation will be provided. Please refer to "NOTES" tab for provider progress note.    Assessment & Plan:   Anjelica was seen today for back pain.  Diagnoses and all orders for this visit:  DDD (degenerative disc disease), lumbar  Bilateral sciatica -     Lumbar Epidural Injection  Lumbar spondylosis with myelopathy -     Lumbar Epidural Injection  Spinal stenosis of lumbar region with neurogenic claudication -     Lumbar Epidural Injection  Chronic, continuous use of opioids  Chronic pain syndrome  Pain in joint of left shoulder  Primary osteoarthritis of both knees  Other orders -     triamcinolone acetonide (KENALOG-40) injection 40 mg -     sodium chloride flush (NS) 0.9 % injection 10 mL -     ropivacaine (PF) 2 mg/mL (0.2%) (NAROPIN) injection 10 mL -     midazolam (VERSED) injection 5 mg -     lidocaine (PF) (XYLOCAINE) 1 % injection 5 mL -     lactated ringers infusion 1,000 mL -     iohexol (OMNIPAQUE) 180 MG/ML injection 10 mL -     HYDROcodone-acetaminophen (NORCO) 7.5-325 MG tablet; Take 1 tablet by mouth every 6 (six) hours as needed for  moderate pain or severe pain. -     HYDROcodone-acetaminophen (NORCO) 7.5-325 MG tablet; Take 1 tablet by mouth every 6 (six) hours as needed for moderate pain or severe pain.        ----------------------------------------------------------------------------------------------------------------------  Problem List Items Addressed This Visit    None    Visit Diagnoses    DDD (degenerative disc disease), lumbar    -  Primary   Relevant Medications   triamcinolone acetonide (KENALOG-40) injection 40 mg (Completed)   HYDROcodone-acetaminophen (NORCO) 7.5-325 MG tablet (Start on 04/15/2020)   HYDROcodone-acetaminophen (NORCO) 7.5-325 MG tablet (Start on 05/15/2020)   Bilateral sciatica       Relevant Medications   midazolam (VERSED) injection 5 mg (Completed)   Lumbar spondylosis with myelopathy       Relevant Medications   triamcinolone acetonide (KENALOG-40) injection 40 mg (Completed)   HYDROcodone-acetaminophen (NORCO) 7.5-325 MG tablet (Start on 04/15/2020)   HYDROcodone-acetaminophen (NORCO) 7.5-325 MG tablet (Start on 05/15/2020)   Spinal stenosis of lumbar region with neurogenic claudication       Relevant Medications   triamcinolone acetonide (KENALOG-40) injection 40 mg (Completed)   ropivacaine (PF) 2 mg/mL (0.2%) (NAROPIN) injection 10 mL (Completed)   lidocaine (PF) (XYLOCAINE) 1 % injection 5 mL (Completed)   HYDROcodone-acetaminophen (NORCO) 7.5-325 MG tablet (Start on 04/15/2020)   HYDROcodone-acetaminophen (NORCO) 7.5-325 MG tablet (Start on 05/15/2020)   Chronic, continuous use of opioids       Chronic pain syndrome       Relevant Medications   triamcinolone acetonide (KENALOG-40) injection 40 mg (Completed)   ropivacaine (PF) 2 mg/mL (0.2%) (NAROPIN) injection 10 mL (Completed)   lidocaine (PF) (XYLOCAINE) 1 % injection 5 mL (Completed)   HYDROcodone-acetaminophen (NORCO) 7.5-325 MG tablet (Start on 04/15/2020)   HYDROcodone-acetaminophen (NORCO) 7.5-325 MG tablet  (Start on 05/15/2020)   Pain in joint of left shoulder       Primary  osteoarthritis of both knees       Relevant Medications   triamcinolone acetonide (KENALOG-40) injection 40 mg (Completed)   HYDROcodone-acetaminophen (NORCO) 7.5-325 MG tablet (Start on 04/15/2020)   HYDROcodone-acetaminophen (NORCO) 7.5-325 MG tablet (Start on 05/15/2020)        ----------------------------------------------------------------------------------------------------------------------  1. Bilateral sciatica We will proceed with a repeat epidural today.  The risks and benefits of been reviewed with her in full detail and all questions answered.  I will schedule her for return to clinic in 2 months.  No other changes will be initiated with her pain management program.  Efforts at weight loss have been reinforced. - Lumbar Epidural Injection  2. Lumbar spondylosis with myelopathy Continue core stretching strengthening as best tolerated. - Lumbar Epidural Injection  3. Spinal stenosis of lumbar region with neurogenic claudication As above - Lumbar Epidural Injection  4. DDD (degenerative disc disease), lumbar As above  5. Chronic, continuous use of opioids I have reviewed the [facility] practitioner database information and it is appropriate for refill.  She continues to get good relief with her medication management without side effect.  Unfortunately she has failed more conservative therapy but this continues to give her relief.  Dates will be for December 19 and January 18.  Return to clinic in 2 months.  Continue follow-up with her primary care physicians for baseline medical care.  6. Chronic pain syndrome As above  7. Pain in joint of left shoulder   8. Primary osteoarthritis of both knees     ----------------------------------------------------------------------------------------------------------------------  I am having Velora Heckler. Regan start on HYDROcodone-acetaminophen. I am also  having her maintain her tiotropium, traZODone, albuterol, albuterol, montelukast, nitroGLYCERIN, meloxicam, levETIRAcetam, Combivent Respimat, B-D UF III MINI PEN NEEDLES, losartan, mometasone-formoterol, budesonide, torsemide, clopidogrel, Dupixent, rOPINIRole, Jardiance, NovoLOG FlexPen, sertraline, Lantus SoloStar, esomeprazole, ferrous sulfate, traMADol, amLODipine, isosorbide mononitrate, potassium chloride SA, metolazone, metoprolol tartrate, atorvastatin, carvedilol, and HYDROcodone-acetaminophen. We administered triamcinolone acetonide, sodium chloride flush, ropivacaine (PF) 2 mg/mL (0.2%), midazolam, lidocaine (PF), lactated ringers, and iohexol.   Meds ordered this encounter  Medications  . triamcinolone acetonide (KENALOG-40) injection 40 mg  . sodium chloride flush (NS) 0.9 % injection 10 mL  . ropivacaine (PF) 2 mg/mL (0.2%) (NAROPIN) injection 10 mL  . midazolam (VERSED) injection 5 mg  . lidocaine (PF) (XYLOCAINE) 1 % injection 5 mL  . lactated ringers infusion 1,000 mL  . iohexol (OMNIPAQUE) 180 MG/ML injection 10 mL  . HYDROcodone-acetaminophen (NORCO) 7.5-325 MG tablet    Sig: Take 1 tablet by mouth every 6 (six) hours as needed for moderate pain or severe pain.    Dispense:  100 tablet    Refill:  0  . HYDROcodone-acetaminophen (NORCO) 7.5-325 MG tablet    Sig: Take 1 tablet by mouth every 6 (six) hours as needed for moderate pain or severe pain.    Dispense:  100 tablet    Refill:  0   Patient's Medications  New Prescriptions   HYDROCODONE-ACETAMINOPHEN (NORCO) 7.5-325 MG TABLET    Take 1 tablet by mouth every 6 (six) hours as needed for moderate pain or severe pain.  Previous Medications   ALBUTEROL (PROVENTIL HFA;VENTOLIN HFA) 108 (90 BASE) MCG/ACT INHALER    Inhale 2 puffs into the lungs every 6 (six) hours as needed for wheezing or shortness of breath.   ALBUTEROL (PROVENTIL) (2.5 MG/3ML) 0.083% NEBULIZER SOLUTION    Take 2.5 mg every 4 (four) hours as needed by  nebulization for wheezing or shortness of breath.  AMLODIPINE (NORVASC) 5 MG TABLET    Take 1 tablet (5 mg total) by mouth daily.   ATORVASTATIN (LIPITOR) 40 MG TABLET    Take 1 tablet (40 mg total) by mouth daily.   B-D UF III MINI PEN NEEDLES 31G X 5 MM MISC    USE WITH INSULIN PEN INJECTIONS TWICE DAILY   BUDESONIDE (PULMICORT) 0.5 MG/2ML NEBULIZER SOLUTION    Take 0.5 mg by nebulization daily at 12 noon.    CARVEDILOL (COREG) 3.125 MG TABLET    Take 1 tablet (3.125 mg total) by mouth daily. Take 1 tablet (3.125 mg) by mouth once daily   CLOPIDOGREL (PLAVIX) 75 MG TABLET    Take 1 tablet (75 mg total) by mouth daily.   COMBIVENT RESPIMAT 20-100 MCG/ACT AERS RESPIMAT    Inhale 1 puff into the lungs 4 (four) times daily as needed for wheezing.    DUPILUMAB (DUPIXENT) 300 MG/2ML SOPN    Inject 300 mg into the skin every 14 (fourteen) days.    EMPAGLIFLOZIN (JARDIANCE) 10 MG TABS TABLET    Take 10 mg by mouth daily.   ESOMEPRAZOLE (NEXIUM) 40 MG CAPSULE    Take 40 mg by mouth 2 (two) times daily.   FERROUS SULFATE 325 (65 FE) MG TABLET    Take 325 mg by mouth daily.   ISOSORBIDE MONONITRATE (IMDUR) 30 MG 24 HR TABLET    Take 1 tablet (30 mg total) by mouth 2 (two) times daily.   LANTUS SOLOSTAR 100 UNIT/ML SOLOSTAR PEN    Inject 30-40 Units into the skin See admin instructions. Inject 30 units subcutaneously in the morning at 40 units subcutaneously at bedtime.   LEVETIRACETAM (KEPPRA) 750 MG TABLET    Take 750 mg by mouth 2 (two) times daily.   LOSARTAN (COZAAR) 100 MG TABLET    Take 1 tablet (100 mg total) by mouth daily.   MELOXICAM (MOBIC) 7.5 MG TABLET    Take 7.5 mg by mouth daily as needed for pain.    METOLAZONE (ZAROXOLYN) 2.5 MG TABLET    Take 1 tablet (2.5 mg total) by mouth daily. 1/2 hour prior to torsemide   METOPROLOL TARTRATE (LOPRESSOR) 25 MG TABLET    Take 1 tablet (25 mg total) by mouth 2 (two) times daily as needed.   MOMETASONE-FORMOTEROL (DULERA) 200-5 MCG/ACT AERO    Inhale  2 puffs into the lungs 2 (two) times daily.   MONTELUKAST (SINGULAIR) 10 MG TABLET    Take 10 mg by mouth daily.    NITROGLYCERIN (NITROSTAT) 0.4 MG SL TABLET    DISSOLVE ONE TABLET UNDER THE TONGUE EVERY 5 MINUTES AS NEEDED FOR CHEST PAIN.  DO NOT EXCEED A TOTAL OF 3 DOSES IN 15 MINUTES   NOVOLOG FLEXPEN 100 UNIT/ML FLEXPEN    Inject 10 Units into the skin 3 (three) times daily with meals.   POTASSIUM CHLORIDE SA (KLOR-CON) 20 MEQ TABLET    Take 2 tablets (40 mEq total) by mouth 2 (two) times daily. Take extra 2 tablets (40 meq) if you take metolazone   ROPINIROLE (REQUIP) 1 MG TABLET    Take 1 mg by mouth at bedtime.    SERTRALINE (ZOLOFT) 100 MG TABLET    Take 100 mg by mouth at bedtime.   TIOTROPIUM (SPIRIVA) 18 MCG INHALATION CAPSULE    Place 18 mcg into inhaler and inhale daily.   TORSEMIDE (DEMADEX) 20 MG TABLET    Take 3 tablets (60 mg total) by mouth 2 (two) times  daily.   TRAMADOL (ULTRAM) 50 MG TABLET    Take 1 tablet (50 mg total) by mouth every 6 (six) hours.   TRAZODONE (DESYREL) 50 MG TABLET    Take 50 mg by mouth at bedtime.  Modified Medications   Modified Medication Previous Medication   HYDROCODONE-ACETAMINOPHEN (NORCO) 7.5-325 MG TABLET HYDROcodone-acetaminophen (NORCO) 7.5-325 MG tablet      Take 1 tablet by mouth every 6 (six) hours as needed for moderate pain or severe pain.    Take 1 tablet by mouth every 6 (six) hours as needed for moderate pain or severe pain.  Discontinued Medications   No medications on file   ----------------------------------------------------------------------------------------------------------------------  Follow-up: Return in about 2 months (around 05/20/2020) for evaluation, med refill.   Procedure: L5-S1 LESI with fluoroscopic guidance and with moderate sedation  NOTE: The risks, benefits, and expectations of the procedure have been discussed and explained to the patient who was understanding and in agreement with suggested treatment plan.  No guarantees were made.  DESCRIPTION OF PROCEDURE: Lumbar epidural steroid injection with 2 mg IV Versed, EKG, blood pressure, pulse, and pulse oximetry monitoring. The procedure was performed with the patient in the prone position under fluoroscopic guidance.  Sterile prep x3 was initiated and I then injected subcutaneous lidocaine to the overlying L5-S1 site after its fluoroscopic identifictation.  Using strict aseptic technique, I then advanced an 18-gauge Tuohy epidural needle in the midline using interlaminar approach via loss-of-resistance to saline technique. There was negative aspiration for heme or  CSF.  I then confirmed position with both AP and Lateral fluoroscan.  2 cc of contrast dye were injected and a  total of 5 mL of Preservative-Free normal saline mixed with 40 mg of Kenalog and 1cc Ropicaine 0.2 percent were injected incrementally via the  epidurally placed needle. The needle was removed. The patient tolerated the injection well and was convalesced and discharged to home in stable condition. Should the patient have any post procedure difficulty they have been instructed on how to contact us for assistance.    Molli Barrows, MD

## 2020-03-21 NOTE — Telephone Encounter (Signed)
Post procedure phone call.  Voicemail has not been set up yet.  

## 2020-04-11 ENCOUNTER — Telehealth: Payer: Self-pay

## 2020-04-11 NOTE — Telephone Encounter (Signed)
Patient has a voicemail that has not been set up yet.  Unable to leave a message.  Patient has 2 scripts at her pharmacy for Hydrocodone.  One can be filled on 04-15-2020.

## 2020-04-11 NOTE — Telephone Encounter (Signed)
Her meds are due, Dr. Andree Elk said he didn't want to see her till January. Did he call out her medicines.

## 2020-04-12 NOTE — Telephone Encounter (Signed)
Attempted to call patient again.  Her voicemail has not been set up

## 2020-05-03 ENCOUNTER — Telehealth: Payer: Self-pay | Admitting: Cardiovascular Disease

## 2020-05-03 NOTE — Telephone Encounter (Signed)
Patients pharmacy calling in to clarify  the prescriptions for carvedilol and metoprolol  Patient complained to pharmacy about low BP and incorrect usage of torsemide

## 2020-05-04 ENCOUNTER — Other Ambulatory Visit: Payer: Self-pay

## 2020-05-04 MED ORDER — CARVEDILOL 3.125 MG PO TABS
3.1250 mg | ORAL_TABLET | Freq: Every day | ORAL | 3 refills | Status: DC
Start: 2020-05-04 — End: 2020-07-13

## 2020-05-04 MED ORDER — METOPROLOL TARTRATE 25 MG PO TABS
25.0000 mg | ORAL_TABLET | Freq: Two times a day (BID) | ORAL | 3 refills | Status: DC | PRN
Start: 2020-05-04 — End: 2020-07-13

## 2020-05-04 MED ORDER — TORSEMIDE 20 MG PO TABS
60.0000 mg | ORAL_TABLET | Freq: Two times a day (BID) | ORAL | 3 refills | Status: DC
Start: 2020-05-04 — End: 2020-08-03

## 2020-05-04 NOTE — Telephone Encounter (Signed)
Spoke with pharmacist at [facility] on [address], refills placed, no concerns at this time, reports will speak with pt and educate pt with any concerns or questions she may have when her medications are picked up.   Carvedilol 3.125 mg daily Metoprolol 25mg  PRN BID Torsemide 60mg  BID

## 2020-05-07 ENCOUNTER — Other Ambulatory Visit: Payer: Self-pay | Admitting: Cardiovascular Disease

## 2020-05-08 NOTE — Telephone Encounter (Signed)
Spoke with pt about her current medications, reports her pharmacy told her the other day that they had questions about her medications, advised pt I spoke with her pharmacy at [facility] and they didn't have anything of concern, they were worried that pt had concerns and what to take. Reviewed pt's medications  Carvedilol 3.125 mg daily  Metoprolol 25mg  PRN BID  Torsemide 60mg  BID Advised the torsemide was the only medication pharmacy did not have for refills, they were sent in on 1/7 and are ready for pt pick up. Pt verbalized understanding, states she will pick up today. Otherwise all questions or concerns were address and no additional concerns at this time, will call back for anything further.

## 2020-05-08 NOTE — Telephone Encounter (Signed)
Patient returning call to discuss medications.

## 2020-05-15 ENCOUNTER — Ambulatory Visit: Payer: Medicare HMO | Attending: Anesthesiology | Admitting: Anesthesiology

## 2020-05-15 DIAGNOSIS — F1190 Opioid use, unspecified, uncomplicated: Secondary | ICD-10-CM

## 2020-05-15 DIAGNOSIS — M5431 Sciatica, right side: Secondary | ICD-10-CM | POA: Diagnosis not present

## 2020-05-15 DIAGNOSIS — M4716 Other spondylosis with myelopathy, lumbar region: Secondary | ICD-10-CM | POA: Diagnosis not present

## 2020-05-15 DIAGNOSIS — M48062 Spinal stenosis, lumbar region with neurogenic claudication: Secondary | ICD-10-CM | POA: Diagnosis not present

## 2020-05-15 DIAGNOSIS — M47817 Spondylosis without myelopathy or radiculopathy, lumbosacral region: Secondary | ICD-10-CM

## 2020-05-15 DIAGNOSIS — G894 Chronic pain syndrome: Secondary | ICD-10-CM

## 2020-05-15 DIAGNOSIS — M5432 Sciatica, left side: Secondary | ICD-10-CM

## 2020-05-15 DIAGNOSIS — M5136 Other intervertebral disc degeneration, lumbar region: Secondary | ICD-10-CM

## 2020-05-15 MED ORDER — HYDROCODONE-ACETAMINOPHEN 7.5-325 MG PO TABS
1.0000 | ORAL_TABLET | Freq: Four times a day (QID) | ORAL | 0 refills | Status: DC | PRN
Start: 2020-06-06 — End: 2020-06-11

## 2020-05-15 NOTE — Progress Notes (Signed)
Virtual Visit via Telephone Note  I connected with Tina Hayes on 05/15/20 at  1:35 PM EST by telephone and verified that I am speaking with the correct person using two identifiers.  Location: Patient: Home Provider: [facility]   I discussed the limitations, risks, security and privacy concerns of performing an evaluation and management service by telephone and the availability of in person appointments. I also discussed with the patient that there may be a patient responsible charge related to this service. The patient expressed understanding and agreed to proceed.   History of Present Illness: I spoke with Tina Hayes via telephone as she was unable to link for the video portion of the virtual conference.  She last had an epidural back in November and did very well through Thanksgiving and Christmas.  Over the course of the last 7 to 10 days she reports increasing pain affecting the low back and bilateral calves consistent with the sciatica she has experienced in the past.  The quality characteristic and distribution of this is similar to what has responded favorably to epidurals.  She receives these approximately every 2 months or so to keep her pain under good control.  Unfortunately she has failed more conservative therapy.  She still taking her hydrocodone approximately 3 times a day to 4 times a day but breakthrough after about 4 to 6 hours with this.  She gets approximately 75% improvement with the medication and no side effects are reported.  Unfortunately she has failed more conservative therapy.  No change in strength or bowel or bladder function is noted.   Observations/Objective:  Current Outpatient Medications:  .  albuterol (PROVENTIL HFA;VENTOLIN HFA) 108 (90 Base) MCG/ACT inhaler, Inhale 2 puffs into the lungs every 6 (six) hours as needed for wheezing or shortness of breath., Disp: 1 Inhaler, Rfl: 2 .  albuterol (PROVENTIL) (2.5 MG/3ML) 0.083% nebulizer solution,  Take 2.5 mg every 4 (four) hours as needed by nebulization for wheezing or shortness of breath., Disp: , Rfl:  .  amLODipine (NORVASC) 5 MG tablet, Take 1 tablet (5 mg total) by mouth daily., Disp: 30 tablet, Rfl: 6 .  atorvastatin (LIPITOR) 40 MG tablet, Take 1 tablet (40 mg total) by mouth daily., Disp: 90 tablet, Rfl: 1 .  B-D UF III MINI PEN NEEDLES 31G X 5 MM MISC, USE WITH INSULIN PEN INJECTIONS TWICE DAILY, Disp: , Rfl:  .  budesonide (PULMICORT) 0.5 MG/2ML nebulizer solution, Take 0.5 mg by nebulization daily at 12 noon. , Disp: , Rfl:  .  carvedilol (COREG) 3.125 MG tablet, Take 1 tablet (3.125 mg total) by mouth daily. Take 1 tablet (3.125 mg) by mouth once a day, Disp: 90 tablet, Rfl: 3 .  clopidogrel (PLAVIX) 75 MG tablet, Take 1 tablet (75 mg total) by mouth daily., Disp: 90 tablet, Rfl: 3 .  COMBIVENT RESPIMAT 20-100 MCG/ACT AERS respimat, Inhale 1 puff into the lungs 4 (four) times daily as needed for wheezing. , Disp: , Rfl:  .  Dupilumab (DUPIXENT) 300 MG/2ML SOPN, Inject 300 mg into the skin every 14 (fourteen) days. , Disp: , Rfl:  .  empagliflozin (JARDIANCE) 10 MG TABS tablet, Take 10 mg by mouth daily., Disp: , Rfl:  .  esomeprazole (NEXIUM) 40 MG capsule, Take 40 mg by mouth 2 (two) times daily., Disp: , Rfl:  .  ferrous sulfate 325 (65 FE) MG tablet, Take 325 mg by mouth daily., Disp: , Rfl:  .  HYDROcodone-acetaminophen (NORCO) 7.5-325 MG tablet,  Take 1 tablet by mouth every 6 (six) hours as needed for moderate pain or severe pain., Disp: 100 tablet, Rfl: 0 .  [START ON 06/06/2020] HYDROcodone-acetaminophen (NORCO) 7.5-325 MG tablet, Take 1 tablet by mouth every 6 (six) hours as needed for moderate pain or severe pain., Disp: 120 tablet, Rfl: 0 .  isosorbide mononitrate (IMDUR) 30 MG 24 hr tablet, Take 1 tablet by mouth twice daily, Disp: 180 tablet, Rfl: 0 .  LANTUS SOLOSTAR 100 UNIT/ML Solostar Pen, Inject 30-40 Units into the skin See admin instructions. Inject 30 units  subcutaneously in the morning at 40 units subcutaneously at bedtime., Disp: , Rfl:  .  levETIRAcetam (KEPPRA) 750 MG tablet, Take 750 mg by mouth 2 (two) times daily., Disp: , Rfl:  .  losartan (COZAAR) 100 MG tablet, Take 1 tablet (100 mg total) by mouth daily., Disp: 30 tablet, Rfl: 11 .  meloxicam (MOBIC) 7.5 MG tablet, Take 7.5 mg by mouth daily as needed for pain. , Disp: , Rfl:  .  metolazone (ZAROXOLYN) 2.5 MG tablet, Take 1 tablet (2.5 mg total) by mouth daily. 1/2 hour prior to torsemide, Disp: 2 tablet, Rfl: 0 .  metoprolol tartrate (LOPRESSOR) 25 MG tablet, Take 1 tablet (25 mg total) by mouth 2 (two) times daily as needed. Only take when needed for fast or high heart rate, may take two times a day AS NEEDED (not daily), Disp: 60 tablet, Rfl: 3 .  mometasone-formoterol (DULERA) 200-5 MCG/ACT AERO, Inhale 2 puffs into the lungs 2 (two) times daily., Disp: 13 g, Rfl: 0 .  montelukast (SINGULAIR) 10 MG tablet, Take 10 mg by mouth daily. , Disp: , Rfl:  .  nitroGLYCERIN (NITROSTAT) 0.4 MG SL tablet, DISSOLVE ONE TABLET UNDER THE TONGUE EVERY 5 MINUTES AS NEEDED FOR CHEST PAIN.  DO NOT EXCEED A TOTAL OF 3 DOSES IN 15 MINUTES (Patient taking differently: Place 0.4 mg under the tongue every 5 (five) minutes as needed for chest pain. ), Disp: 25 tablet, Rfl: 0 .  NOVOLOG FLEXPEN 100 UNIT/ML FlexPen, Inject 10 Units into the skin 3 (three) times daily with meals., Disp: , Rfl:  .  potassium chloride SA (KLOR-CON) 20 MEQ tablet, Take 2 tablets (40 mEq total) by mouth 2 (two) times daily. Take extra 2 tablets (40 meq) if you take metolazone, Disp: 140 tablet, Rfl: 5 .  rOPINIRole (REQUIP) 1 MG tablet, Take 1 mg by mouth at bedtime. , Disp: , Rfl:  .  sertraline (ZOLOFT) 100 MG tablet, Take 100 mg by mouth at bedtime., Disp: , Rfl:  .  tiotropium (SPIRIVA) 18 MCG inhalation capsule, Place 18 mcg into inhaler and inhale daily., Disp: , Rfl:  .  torsemide (DEMADEX) 20 MG tablet, Take 3 tablets (60 mg  total) by mouth 2 (two) times daily. 60mg  (take 3 tabs) by mouth two times a day, Disp: 540 tablet, Rfl: 3 .  traMADol (ULTRAM) 50 MG tablet, Take 1 tablet (50 mg total) by mouth every 6 (six) hours., Disp: 40 tablet, Rfl: 0 .  traZODone (DESYREL) 50 MG tablet, Take 50 mg by mouth at bedtime., Disp: , Rfl:   Assessment and Plan: 1. Bilateral sciatica   2. Lumbar spondylosis with myelopathy   3. Spinal stenosis of lumbar region with neurogenic claudication   4. DDD (degenerative disc disease), lumbar   5. Chronic, continuous use of opioids   6. Chronic pain syndrome   7. Facet arthritis of lumbosacral region   I have reviewed the [place] practitioner database information and it is appropriate for opioid refill.  I have informed her that she may increase to 4 times a day dosing as needed for her Vicodin 7.5 mg tablets.  I will also refill her prescription for February 9 for 120 tablets.  She is due for a refill today for 100 tablets and as a result she will need an early refill.  She is to continue her other medications as prescribed and continue efforts at stretching.  Based on weight she is limited on ambulation.  We will schedule her for a 3 to 4-week return for an epidural at that point.  She is to continue follow-up with her primary care physicians for her baseline medical care.  Follow Up Instructions:    I discussed the assessment and treatment plan with the patient. The patient was provided an opportunity to ask questions and all were answered. The patient agreed with the plan and demonstrated an understanding of the instructions.   The patient was advised to call back or seek an in-person evaluation if the symptoms worsen or if the condition fails to improve as anticipated.  I provided 30 minutes of non-face-to-face time during this encounter.   Molli Barrows, MD

## 2020-06-11 ENCOUNTER — Ambulatory Visit
Admission: RE | Admit: 2020-06-11 | Discharge: 2020-06-11 | Disposition: A | Discharge status: Home / Self Care | Payer: Medicare HMO | Source: Ambulatory Visit | Attending: Anesthesiology | Admitting: Anesthesiology

## 2020-06-11 ENCOUNTER — Other Ambulatory Visit: Payer: Self-pay | Admitting: Anesthesiology

## 2020-06-11 ENCOUNTER — Other Ambulatory Visit: Payer: Self-pay

## 2020-06-11 ENCOUNTER — Encounter: Payer: Self-pay | Admitting: Anesthesiology

## 2020-06-11 ENCOUNTER — Ambulatory Visit (HOSPITAL_BASED_OUTPATIENT_CLINIC_OR_DEPARTMENT_OTHER): Payer: Medicare HMO | Admitting: Anesthesiology

## 2020-06-11 VITALS — BP 161/56 | HR 63 | Temp 97.3°F | Resp 16 | Ht 71.0 in | Wt 250.0 lb

## 2020-06-11 DIAGNOSIS — M48062 Spinal stenosis, lumbar region with neurogenic claudication: Secondary | ICD-10-CM | POA: Diagnosis present

## 2020-06-11 DIAGNOSIS — M5431 Sciatica, right side: Secondary | ICD-10-CM | POA: Diagnosis present

## 2020-06-11 DIAGNOSIS — M47817 Spondylosis without myelopathy or radiculopathy, lumbosacral region: Secondary | ICD-10-CM

## 2020-06-11 DIAGNOSIS — M5432 Sciatica, left side: Secondary | ICD-10-CM

## 2020-06-11 DIAGNOSIS — G894 Chronic pain syndrome: Secondary | ICD-10-CM | POA: Diagnosis present

## 2020-06-11 DIAGNOSIS — R52 Pain, unspecified: Secondary | ICD-10-CM | POA: Insufficient documentation

## 2020-06-11 DIAGNOSIS — F1190 Opioid use, unspecified, uncomplicated: Secondary | ICD-10-CM | POA: Diagnosis present

## 2020-06-11 DIAGNOSIS — M4716 Other spondylosis with myelopathy, lumbar region: Secondary | ICD-10-CM | POA: Insufficient documentation

## 2020-06-11 DIAGNOSIS — M5136 Other intervertebral disc degeneration, lumbar region: Secondary | ICD-10-CM

## 2020-06-11 MED ORDER — HYDROCODONE-ACETAMINOPHEN 7.5-325 MG PO TABS: 1.0000 | ORAL_TABLET | Freq: Four times a day (QID) | ORAL | 0 refills | Status: DC | PRN

## 2020-06-11 MED ORDER — SODIUM CHLORIDE (PF) 0.9 % IJ SOLN
INTRAMUSCULAR | Status: AC
  Filled 2020-06-11: qty 10

## 2020-06-11 MED ORDER — LIDOCAINE HCL (PF) 1 % IJ SOLN
5.0000 mL | Freq: Once | INTRAMUSCULAR | Status: AC
  Administered 2020-06-11: 5 mL via SUBCUTANEOUS

## 2020-06-11 MED ORDER — HYDROCODONE-ACETAMINOPHEN 7.5-325 MG PO TABS: 1.0000 | ORAL_TABLET | Freq: Four times a day (QID) | ORAL | 0 refills | Status: AC | PRN

## 2020-06-11 MED ORDER — IOHEXOL 180 MG/ML  SOLN
10.0000 mL | Freq: Once | INTRAMUSCULAR | Status: AC | PRN
  Administered 2020-06-11: 20 mL via EPIDURAL

## 2020-06-11 MED ORDER — LIDOCAINE HCL (PF) 1 % IJ SOLN
INTRAMUSCULAR | Status: AC
  Filled 2020-06-11: qty 5

## 2020-06-11 MED ORDER — MIDAZOLAM HCL 5 MG/5ML IJ SOLN: 5.0000 mg | Freq: Once | INTRAMUSCULAR | Status: DC

## 2020-06-11 MED ORDER — TRIAMCINOLONE ACETONIDE 40 MG/ML IJ SUSP
40.0000 mg | Freq: Once | INTRAMUSCULAR | Status: AC
  Administered 2020-06-11: 40 mg

## 2020-06-11 MED ORDER — ROPIVACAINE HCL 2 MG/ML IJ SOLN
10.0000 mL | Freq: Once | INTRAMUSCULAR | Status: AC
  Administered 2020-06-11: 10 mL via EPIDURAL

## 2020-06-11 MED ORDER — TRIAMCINOLONE ACETONIDE 40 MG/ML IJ SUSP
INTRAMUSCULAR | Status: AC
  Filled 2020-06-11: qty 1

## 2020-06-11 MED ORDER — SODIUM CHLORIDE 0.9% FLUSH
10.0000 mL | Freq: Once | INTRAVENOUS | Status: AC
  Administered 2020-06-11: 10 mL

## 2020-06-11 MED ORDER — ROPIVACAINE HCL 2 MG/ML IJ SOLN
INTRAMUSCULAR | Status: AC
  Filled 2020-06-11: qty 10

## 2020-06-11 MED ORDER — LACTATED RINGERS IV SOLN: 1000.0000 mL | INTRAVENOUS | Status: DC

## 2020-06-11 NOTE — Patient Instructions (Signed)

## 2020-06-11 NOTE — Progress Notes (Signed)
Subjective:  Patient ID: Tina Hayes, female    DOB: 01-25-1946  Age: 75 y.o. MRN: [id number]  CC: Back Pain (low)   Procedure: Lumbar epidural steroid under fluoroscopic guidance with out moderate sedation at L5-S1  HPI Tina Hayes presents for reevaluation.  She has a longstanding history of chronic low back pain and spinal stenosis with sciatica affecting both legs.  The pain has more recently been right side greater than left and she has received epidural steroids in the past for this pain.  The quality characteristic and distribution has been stable but has been more severe over the past 2 weeks.  Her last epidural was several months ago and she generally gets approximately 6 weeks of relief rated at about 80 to 90% before she gets recurrence of her usual pain.  In the meantime she has been taking her hydrocodone effectively to help keep the pain under control.  She has failed more conservative therapy but her medication management enables her to stay active and sleep better at night.  No side effects reported and she feels that she continues to derive good functional benefit from the medications.  No change in bowel or bladder function is noted at this time peer  Outpatient Medications Prior to Visit  Medication Sig Dispense Refill  . albuterol (PROVENTIL HFA;VENTOLIN HFA) 108 (90 Base) MCG/ACT inhaler Inhale 2 puffs into the lungs every 6 (six) hours as needed for wheezing or shortness of breath. 1 Inhaler 2  . albuterol (PROVENTIL) (2.5 MG/3ML) 0.083% nebulizer solution Take 2.5 mg every 4 (four) hours as needed by nebulization for wheezing or shortness of breath.    [name] amLODipine (NORVASC) 5 MG tablet Take 1 tablet (5 mg total) by mouth daily. 30 tablet 6  . atorvastatin (LIPITOR) 40 MG tablet Take 1 tablet (40 mg total) by mouth daily. 90 tablet 1  . B-D UF III MINI PEN NEEDLES 31G X 5 MM MISC USE WITH INSULIN PEN INJECTIONS TWICE DAILY    . budesonide (PULMICORT) 0.5 MG/2ML  nebulizer solution Take 0.5 mg by nebulization daily at 12 noon.     . carvedilol (COREG) 3.125 MG tablet Take 1 tablet (3.125 mg total) by mouth daily. Take 1 tablet (3.125 mg) by mouth once a day 90 tablet 3  . clopidogrel (PLAVIX) 75 MG tablet Take 1 tablet (75 mg total) by mouth daily. 90 tablet 3  . COMBIVENT RESPIMAT 20-100 MCG/ACT AERS respimat Inhale 1 puff into the lungs 4 (four) times daily as needed for wheezing.     . Dupilumab (DUPIXENT) 300 MG/2ML SOPN Inject 300 mg into the skin every 14 (fourteen) days.     . empagliflozin (JARDIANCE) 10 MG TABS tablet Take 10 mg by mouth daily.    [name] esomeprazole (NEXIUM) 40 MG capsule Take 40 mg by mouth 2 (two) times daily.    . ferrous sulfate 325 (65 FE) MG tablet Take 325 mg by mouth daily.    . isosorbide mononitrate (IMDUR) 30 MG 24 hr tablet Take 1 tablet by mouth twice daily 180 tablet 0  . LANTUS SOLOSTAR 100 UNIT/ML Solostar Pen Inject 30-40 Units into the skin See admin instructions. Inject 30 units subcutaneously in the morning at 40 units subcutaneously at bedtime.    . levETIRAcetam (KEPPRA) 750 MG tablet Take 750 mg by mouth 2 (two) times daily.    [name] losartan (COZAAR) 100 MG tablet Take 1 tablet (100 mg total) by mouth daily. 30 tablet 11  .  meloxicam (MOBIC) 7.5 MG tablet Take 7.5 mg by mouth daily as needed for pain.     . metoprolol tartrate (LOPRESSOR) 25 MG tablet Take 1 tablet (25 mg total) by mouth 2 (two) times daily as needed. Only take when needed for fast or high heart rate, may take two times a day AS NEEDED (not daily) 60 tablet 3  . mometasone-formoterol (DULERA) 200-5 MCG/ACT AERO Inhale 2 puffs into the lungs 2 (two) times daily. 13 g 0  . montelukast (SINGULAIR) 10 MG tablet Take 10 mg by mouth daily.     . nitroGLYCERIN (NITROSTAT) 0.4 MG SL tablet DISSOLVE ONE TABLET UNDER THE TONGUE EVERY 5 MINUTES AS NEEDED FOR CHEST PAIN.  DO NOT EXCEED A TOTAL OF 3 DOSES IN 15 MINUTES (Patient taking differently: Place 0.4 mg  under the tongue every 5 (five) minutes as needed for chest pain.) 25 tablet 0  . NOVOLOG FLEXPEN 100 UNIT/ML FlexPen Inject 10 Units into the skin 3 (three) times daily with meals.    . potassium chloride SA (KLOR-CON) 20 MEQ tablet Take 2 tablets (40 mEq total) by mouth 2 (two) times daily. Take extra 2 tablets (40 meq) if you take metolazone 140 tablet 5  . rOPINIRole (REQUIP) 1 MG tablet Take 1 mg by mouth at bedtime.     . sertraline (ZOLOFT) 100 MG tablet Take 100 mg by mouth at bedtime.    [name] tiotropium (SPIRIVA) 18 MCG inhalation capsule Place 18 mcg into inhaler and inhale daily.    [name] torsemide (DEMADEX) 20 MG tablet Take 3 tablets (60 mg total) by mouth 2 (two) times daily. 60mg  (take 3 tabs) by mouth two times a day 540 tablet 3  . traZODone (DESYREL) 50 MG tablet Take 50 mg by mouth at bedtime.    [name] HYDROcodone-acetaminophen (NORCO) 7.5-325 MG tablet Take 1 tablet by mouth every 6 (six) hours as needed for moderate pain or severe pain. 100 tablet 0  . HYDROcodone-acetaminophen (NORCO) 7.5-325 MG tablet Take 1 tablet by mouth every 6 (six) hours as needed for moderate pain or severe pain. 120 tablet 0  . metolazone (ZAROXOLYN) 2.5 MG tablet Take 1 tablet (2.5 mg total) by mouth daily. 1/2 hour prior to torsemide 2 tablet 0  . traMADol (ULTRAM) 50 MG tablet Take 1 tablet (50 mg total) by mouth every 6 (six) hours. (Patient not taking: Reported on 06/11/2020) 40 tablet 0   No facility-administered medications prior to visit.    Review of Systems CNS: No confusion or sedation Cardiac: No angina or palpitations GI: No abdominal pain or constipation Constitutional: No nausea vomiting fevers or chills  Objective:  BP (!) 162/76   Pulse 63   Temp (!) 97.3 F (36.3 C)   Resp 20   Ht 5\' 11"  (1.803 m)   Wt 250 lb (113.4 kg)   SpO2 95%   BMI 34.87 kg/m    BP Readings from Last 3 Encounters:  06/11/20 (!) 162/76  03/20/20 139/72  01/11/20 (!) 152/89     Wt Readings from Last 3  Encounters:  06/11/20 250 lb (113.4 kg)  03/20/20 269 lb (122 kg)  01/11/20 267 lb (121.1 kg)     Physical Exam Pt is alert and oriented PERRL EOMI HEART IS RRR no murmur or rub LCTA no wheezing or rales MUSCULOSKELETAL reveals some paraspinous muscle tenderness but no overt trigger points in the lumbar region.  She walks with antalgic gait and is in a wheelchair today for assistance.  Muscle tone and bulk of the base  Labs  Lab Results  Component Value Date   HGBA1C 6.5 (H) 01/18/2019   HGBA1C 7.5 (H) 05/27/2018   HGBA1C 7.7 (H) 08/16/2017   Lab Results  Component Value Date   LDLCALC 58 01/30/2017   CREATININE 1.05 (H) 09/30/2019    -------------------------------------------------------------------------------------------------------------------- Lab Results  Component Value Date   WBC 7.9 09/30/2019   HGB 8.9 (L) 09/30/2019   HCT 28.3 (L) 09/30/2019   PLT 238 09/30/2019   GLUCOSE 144 (H) 09/30/2019   CHOL 120 01/30/2017   TRIG 73 01/30/2017   HDL 47 01/30/2017   LDLCALC 58 01/30/2017   ALT 13 07/22/2019   AST 17 07/22/2019   NA 143 09/30/2019   K 3.8 09/30/2019   CL 107 09/30/2019   CREATININE 1.05 (H) 09/30/2019   BUN 14 09/30/2019   CO2 28 09/30/2019   INR 1.0 07/22/2019   HGBA1C 6.5 (H) 01/18/2019    --------------------------------------------------------------------------------------------------------------------- No results found.   Assessment & Plan:   Shakerra was seen today for back pain.  Diagnoses and all orders for this visit:  Chronic, continuous use of opioids  Bilateral sciatica -     Lumbar Epidural Injection -     Lumbar Epidural Injection; Future  Lumbar spondylosis with myelopathy -     Lumbar Epidural Injection -     Lumbar Epidural Injection; Future  Spinal stenosis of lumbar region with neurogenic claudication -     Lumbar Epidural Injection -     Lumbar Epidural Injection; Future  DDD (degenerative disc disease),  lumbar -     Lumbar Epidural Injection -     Lumbar Epidural Injection; Future  Chronic pain syndrome  Facet arthritis of lumbosacral region  Other orders -     HYDROcodone-acetaminophen (NORCO) 7.5-325 MG tablet; Take 1 tablet by mouth every 6 (six) hours as needed for moderate pain or severe pain. -     HYDROcodone-acetaminophen (NORCO) 7.5-325 MG tablet; Take 1 tablet by mouth every 6 (six) hours as needed for moderate pain or severe pain. -     triamcinolone acetonide (KENALOG-40) injection 40 mg -     sodium chloride flush (NS) 0.9 % injection 10 mL -     ropivacaine (PF) 2 mg/mL (0.2%) (NAROPIN) injection 10 mL -     lidocaine (PF) (XYLOCAINE) 1 % injection 5 mL -     iohexol (OMNIPAQUE) 180 MG/ML injection 10 mL -     midazolam (VERSED) 5 MG/5ML injection 5 mg -     lactated ringers infusion 1,000 mL        ----------------------------------------------------------------------------------------------------------------------  Problem List Items Addressed This Visit   None   Visit Diagnoses    Chronic, continuous use of opioids    -  Primary   Bilateral sciatica       Relevant Medications   midazolam (VERSED) 5 MG/5ML injection 5 mg   Other Relevant Orders   Lumbar Epidural Injection   Lumbar spondylosis with myelopathy       Relevant Medications   HYDROcodone-acetaminophen (NORCO) 7.5-325 MG tablet (Start on 07/06/2020)   HYDROcodone-acetaminophen (NORCO) 7.5-325 MG tablet (Start on 08/05/2020)   triamcinolone acetonide (KENALOG-40) injection 40 mg   Other Relevant Orders   Lumbar Epidural Injection   Spinal stenosis of lumbar region with neurogenic claudication       Relevant Medications   HYDROcodone-acetaminophen (NORCO) 7.5-325 MG tablet (Start on 07/06/2020)   HYDROcodone-acetaminophen (NORCO) 7.5-325 MG tablet (Start on 08/05/2020)  triamcinolone acetonide (KENALOG-40) injection 40 mg   ropivacaine (PF) 2 mg/mL (0.2%) (NAROPIN) injection 10 mL   lidocaine (PF)  (XYLOCAINE) 1 % injection 5 mL   Other Relevant Orders   Lumbar Epidural Injection   DDD (degenerative disc disease), lumbar       Relevant Medications   HYDROcodone-acetaminophen (NORCO) 7.5-325 MG tablet (Start on 07/06/2020)   HYDROcodone-acetaminophen (NORCO) 7.5-325 MG tablet (Start on 08/05/2020)   triamcinolone acetonide (KENALOG-40) injection 40 mg   Other Relevant Orders   Lumbar Epidural Injection   Chronic pain syndrome       Relevant Medications   HYDROcodone-acetaminophen (NORCO) 7.5-325 MG tablet (Start on 07/06/2020)   HYDROcodone-acetaminophen (NORCO) 7.5-325 MG tablet (Start on 08/05/2020)   triamcinolone acetonide (KENALOG-40) injection 40 mg   ropivacaine (PF) 2 mg/mL (0.2%) (NAROPIN) injection 10 mL   lidocaine (PF) (XYLOCAINE) 1 % injection 5 mL   Facet arthritis of lumbosacral region       Relevant Medications   HYDROcodone-acetaminophen (NORCO) 7.5-325 MG tablet (Start on 07/06/2020)   HYDROcodone-acetaminophen (NORCO) 7.5-325 MG tablet (Start on 08/05/2020)   triamcinolone acetonide (KENALOG-40) injection 40 mg        ----------------------------------------------------------------------------------------------------------------------  1. Bilateral sciatica  We will proceed with a repeat epidural today.  The risks and benefits of been reviewed in full detail questions answered.  I will have her return to clinic approximately 2 months for repeat epidural at that time.  She has failed more conservative therapy but continues to respond favorably to epidural steroid intermittent therapy.  We have talked about opportunities for spinal cord stimulator as an option.  She is not interested in further interventional therapy or decompressive therapy surgery. - Lumbar Epidural Injection - Lumbar Epidural Injection; Future  2. Lumbar spondylosis with myelopathy As above and continue efforts at weight loss and strength - Lumbar Epidural Injection - Lumbar Epidural  Injection; Future  3. Spinal stenosis of lumbar region with neurogenic claudication  As above - Lumbar Epidural Injection - Lumbar Epidural Injection; Future  4. DDD (degenerative disc disease), lumbar  - Lumbar Epidural Injection - Lumbar Epidural Injection; Future  5. Chronic, continuous use of opioids As above 5 I have reviewed the [facility] practitioner database information is appropriate for refills.  Is a be dated from March 11 and April 10.  No other changes will be initiated today.  6. Chronic pain syndrome As above  7. Facet arthritis of lumbosacral region     ----------------------------------------------------------------------------------------------------------------------  I am having Velora Heckler. Callow maintain her tiotropium, traZODone, albuterol, albuterol, montelukast, nitroGLYCERIN, meloxicam, levETIRAcetam, Combivent Respimat, B-D UF III MINI PEN NEEDLES, losartan, mometasone-formoterol, budesonide, clopidogrel, Dupixent, rOPINIRole, Jardiance, NovoLOG FlexPen, sertraline, Lantus SoloStar, esomeprazole, ferrous sulfate, traMADol, amLODipine, potassium chloride SA, metolazone, atorvastatin, carvedilol, metoprolol tartrate, torsemide, isosorbide mononitrate, HYDROcodone-acetaminophen, and HYDROcodone-acetaminophen.   Meds ordered this encounter  Medications  . HYDROcodone-acetaminophen (NORCO) 7.5-325 MG tablet    Sig: Take 1 tablet by mouth every 6 (six) hours as needed for moderate pain or severe pain.    Dispense:  120 tablet    Refill:  0  . HYDROcodone-acetaminophen (NORCO) 7.5-325 MG tablet    Sig: Take 1 tablet by mouth every 6 (six) hours as needed for moderate pain or severe pain.    Dispense:  120 tablet    Refill:  0  . triamcinolone acetonide (KENALOG-40) injection 40 mg  . sodium chloride flush (NS) 0.9 % injection 10 mL  . ropivacaine (PF) 2 mg/mL (0.2%) (NAROPIN) injection 10  mL  . lidocaine (PF) (XYLOCAINE) 1 % injection 5 mL  . iohexol  (OMNIPAQUE) 180 MG/ML injection 10 mL  . midazolam (VERSED) 5 MG/5ML injection 5 mg  . lactated ringers infusion 1,000 mL   Patient's Medications  New Prescriptions   No medications on file  Previous Medications   ALBUTEROL (PROVENTIL HFA;VENTOLIN HFA) 108 (90 BASE) MCG/ACT INHALER    Inhale 2 puffs into the lungs every 6 (six) hours as needed for wheezing or shortness of breath.   ALBUTEROL (PROVENTIL) (2.5 MG/3ML) 0.083% NEBULIZER SOLUTION    Take 2.5 mg every 4 (four) hours as needed by nebulization for wheezing or shortness of breath.   AMLODIPINE (NORVASC) 5 MG TABLET    Take 1 tablet (5 mg total) by mouth daily.   ATORVASTATIN (LIPITOR) 40 MG TABLET    Take 1 tablet (40 mg total) by mouth daily.   B-D UF III MINI PEN NEEDLES 31G X 5 MM MISC    USE WITH INSULIN PEN INJECTIONS TWICE DAILY   BUDESONIDE (PULMICORT) 0.5 MG/2ML NEBULIZER SOLUTION    Take 0.5 mg by nebulization daily at 12 noon.    CARVEDILOL (COREG) 3.125 MG TABLET    Take 1 tablet (3.125 mg total) by mouth daily. Take 1 tablet (3.125 mg) by mouth once a day   CLOPIDOGREL (PLAVIX) 75 MG TABLET    Take 1 tablet (75 mg total) by mouth daily.   COMBIVENT RESPIMAT 20-100 MCG/ACT AERS RESPIMAT    Inhale 1 puff into the lungs 4 (four) times daily as needed for wheezing.    DUPILUMAB (DUPIXENT) 300 MG/2ML SOPN    Inject 300 mg into the skin every 14 (fourteen) days.    EMPAGLIFLOZIN (JARDIANCE) 10 MG TABS TABLET    Take 10 mg by mouth daily.   ESOMEPRAZOLE (NEXIUM) 40 MG CAPSULE    Take 40 mg by mouth 2 (two) times daily.   FERROUS SULFATE 325 (65 FE) MG TABLET    Take 325 mg by mouth daily.   ISOSORBIDE MONONITRATE (IMDUR) 30 MG 24 HR TABLET    Take 1 tablet by mouth twice daily   LANTUS SOLOSTAR 100 UNIT/ML SOLOSTAR PEN    Inject 30-40 Units into the skin See admin instructions. Inject 30 units subcutaneously in the morning at 40 units subcutaneously at bedtime.   LEVETIRACETAM (KEPPRA) 750 MG TABLET    Take 750 mg by mouth 2 (two)  times daily.   LOSARTAN (COZAAR) 100 MG TABLET    Take 1 tablet (100 mg total) by mouth daily.   MELOXICAM (MOBIC) 7.5 MG TABLET    Take 7.5 mg by mouth daily as needed for pain.    METOLAZONE (ZAROXOLYN) 2.5 MG TABLET    Take 1 tablet (2.5 mg total) by mouth daily. 1/2 hour prior to torsemide   METOPROLOL TARTRATE (LOPRESSOR) 25 MG TABLET    Take 1 tablet (25 mg total) by mouth 2 (two) times daily as needed. Only take when needed for fast or high heart rate, may take two times a day AS NEEDED (not daily)   MOMETASONE-FORMOTEROL (DULERA) 200-5 MCG/ACT AERO    Inhale 2 puffs into the lungs 2 (two) times daily.   MONTELUKAST (SINGULAIR) 10 MG TABLET    Take 10 mg by mouth daily.    NITROGLYCERIN (NITROSTAT) 0.4 MG SL TABLET    DISSOLVE ONE TABLET UNDER THE TONGUE EVERY 5 MINUTES AS NEEDED FOR CHEST PAIN.  DO NOT EXCEED A TOTAL OF 3 DOSES IN 15  MINUTES   NOVOLOG FLEXPEN 100 UNIT/ML FLEXPEN    Inject 10 Units into the skin 3 (three) times daily with meals.   POTASSIUM CHLORIDE SA (KLOR-CON) 20 MEQ TABLET    Take 2 tablets (40 mEq total) by mouth 2 (two) times daily. Take extra 2 tablets (40 meq) if you take metolazone   ROPINIROLE (REQUIP) 1 MG TABLET    Take 1 mg by mouth at bedtime.    SERTRALINE (ZOLOFT) 100 MG TABLET    Take 100 mg by mouth at bedtime.   TIOTROPIUM (SPIRIVA) 18 MCG INHALATION CAPSULE    Place 18 mcg into inhaler and inhale daily.   TORSEMIDE (DEMADEX) 20 MG TABLET    Take 3 tablets (60 mg total) by mouth 2 (two) times daily. 60mg  (take 3 tabs) by mouth two times a day   TRAMADOL (ULTRAM) 50 MG TABLET    Take 1 tablet (50 mg total) by mouth every 6 (six) hours.   TRAZODONE (DESYREL) 50 MG TABLET    Take 50 mg by mouth at bedtime.  Modified Medications   Modified Medication Previous Medication   HYDROCODONE-ACETAMINOPHEN (NORCO) 7.5-325 MG TABLET HYDROcodone-acetaminophen (NORCO) 7.5-325 MG tablet      Take 1 tablet by mouth every 6 (six) hours as needed for moderate pain or severe  pain.    Take 1 tablet by mouth every 6 (six) hours as needed for moderate pain or severe pain.   HYDROCODONE-ACETAMINOPHEN (NORCO) 7.5-325 MG TABLET HYDROcodone-acetaminophen (NORCO) 7.5-325 MG tablet      Take 1 tablet by mouth every 6 (six) hours as needed for moderate pain or severe pain.    Take 1 tablet by mouth every 6 (six) hours as needed for moderate pain or severe pain.  Discontinued Medications   No medications on file   ----------------------------------------------------------------------------------------------------------------------  Follow-up: Return in about 2 months (around 08/09/2020) for procedure.   Procedure: L5-S1 LESI with fluoroscopic guidance and no moderate sedation  NOTE: The risks, benefits, and expectations of the procedure have been discussed and explained to the patient who was understanding and in agreement with suggested treatment plan. No guarantees were made.  DESCRIPTION OF PROCEDURE: Lumbar epidural steroid injection with fluoroscopy IV Versed, EKG, blood pressure, pulse, and pulse oximetry monitoring. The procedure was performed with the patient in the prone position under fluoroscopic guidance.  Sterile prep x3 was initiated and I then injected subcutaneous lidocaine to the overlying L5-S1 site after its fluoroscopic identifictation.  Using strict aseptic technique, I then advanced an 18-gauge Tuohy epidural needle in the midline using interlaminar approach via loss-of-resistance to saline technique. There was negative aspiration for heme or  CSF.  I then confirmed position with both AP and Lateral fluoroscan.  2 cc of contrast dye were injected and a  total of 5 mL of Preservative-Free normal saline mixed with 40 mg of Kenalog and 1cc Ropicaine 0.2 percent were injected incrementally via the  epidurally placed needle. The needle was removed. The patient tolerated the injection well and was convalesced and discharged to home in stable condition. Should the  patient have any post procedure difficulty they have been instructed on how to contact us for assistance.    Molli Barrows, MD

## 2020-06-11 NOTE — Progress Notes (Signed)
Safety precautions to be maintained throughout the outpatient stay will include: orient to surroundings, keep bed in low position, maintain call bell within reach at all times, provide assistance with transfer out of bed and ambulation.  

## 2020-06-12 ENCOUNTER — Telehealth: Payer: Self-pay

## 2020-06-12 NOTE — Telephone Encounter (Signed)
Post procedure follow up.  Patient states she thought she ran a slight fever last night but she does not have one now.  Instructed her to call us back if it returns or if she has any other symptoms, questions or concerns.

## 2020-06-29 ENCOUNTER — Other Ambulatory Visit: Payer: Self-pay

## 2020-06-29 MED ORDER — AMLODIPINE BESYLATE 5 MG PO TABS: 5.0000 mg | ORAL_TABLET | Freq: Every day | ORAL | 0 refills | Status: DC

## 2020-07-01 NOTE — Progress Notes (Deleted)
Date:  07/01/2020   ID:  [name], DOB 24-Nov-1945, MRN [id number]  Patient Location:  [address] [place] [address]   Provider location:   [facility], [place] office  PCP:  Herminio Commons, MD  Cardiologist:  [facility]  No chief complaint on file.   History of Present Illness:    Tina Hayes is a 75 y.o. 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, 24 to 75 yo  Chronic back pain,   HTN  seizure disorder  hospital admission 06/17/2016 for cough, chest pain, shortness of breath,  stress test showing no ischemia PAD: Ulceration right lower extremity,  felt to be atypical in nature, pain at rest, reproducible on palpation who presents  for follow-up of her chest pain, chronic diastolic CHF  LOV 04/173 officeJuly 30, 2021  weight 266 pounds Amlodipine was decreased down to 5 mg daily  In the hospital June 2021 with knee replacement  Weight down, 50 pounds over 2 years, Changed her diet neuropathy   12/2019,  One night, symptom of Fluttering , 3-4 hours in duration     EKG personally reviewed by myself on todays visit Normal sinus rhythm rate 50 nonspecific ST-T wave abnormality 1 and aVL, anterolateral leads  Other studies reviewed on today's visit Underwent PV study March 2021 with Dr. Lucky Cowboy  Percutaneous transluminal angioplasty of right anterior tibial artery with 3 mm diameter angioplasty balloon Percutaneous transluminal angioplasty of the right popliteal artery with 5 mm diameter Lutonix drug-coated angioplasty balloon     Viabahn stent placement to the right popliteal artery with 6 mm diameter   Echocardiogram October 2020 ejection fraction 60% Right heart pressures 47 moderately elevated moderate MR    Other past medical history reviewed with her  hospital  October 2018 for chest pain shortness of breath Felt to be atypical in nature, GI related, piece of meat stuck in her throat with pain radiating to both shoulders, .  Vomiting, unable to get any food or water down  Eventually piece of meat removed and she was able to swallow  history of GERD, she had ran out of her PPI   Prior CV studies:   The following studies were reviewed today:    Past Medical History:  Diagnosis Date  . (HFpEF) heart failure with preserved ejection fraction ([place])    a. 05/2016 Echo: EF 60-65%, mild to mod LVH, Gr1 DD, mild MR, mildly dil LA, mod TR, mildly to mod increased PASP.  [name] Acute diastolic heart failure ([place]) 01/27/2017  . Anxiety   . Arthritis   . Chest pain 06/16/2016  . CHF (congestive heart failure) ([place])   . Chronic back pain   . Chronic diastolic congestive heart failure ([place]) 02/13/2017  . COPD (chronic obstructive pulmonary disease) ([place])   . Coronary artery disease    a. s/p remote PCI x 5;  b. 2006 s/p CABG x 3 ([place], [place]); b. 05/2016 MV: attenuation corrected images w/o ischemia or wma-->Med rx.  . Coronary artery disease of native artery of native heart with stable angina pectoris ([place]) 06/17/2016  . Depression   . Diabetes mellitus without complication ([place])   . Essential hypertension 06/30/2016  . GERD (gastroesophageal reflux disease)   .  Heart attack ([place])    Total of 3 per pt.  . Hypertension   . Hypertensive urgency 06/03/2015  . Seizure ([place])   . Seizures ([place])    Past Surgical History:  Procedure Laterality Date  . ABDOMINAL HYSTERECTOMY    . ANKLE SURGERY Right   . CATARACT EXTRACTION W/ INTRAOCULAR LENS  IMPLANT, BILATERAL    . COLONOSCOPY WITH PROPOFOL N/A 05/04/2017   Procedure: COLONOSCOPY WITH PROPOFOL;  Surgeon: [name], MD;  Location: [facility] ENDOSCOPY;  Service: Endoscopy;  Laterality: N/A;  . CORONARY ANGIOPLASTY    . CORONARY ARTERY BYPASS GRAFT     TRIPLE BYPASS  .  ESOPHAGOGASTRODUODENOSCOPY (EGD) WITH PROPOFOL N/A 05/04/2017   Procedure: ESOPHAGOGASTRODUODENOSCOPY (EGD) WITH PROPOFOL;  Surgeon: [name], MD;  Location: [facility] ENDOSCOPY;  Service: Endoscopy;  Laterality: N/A;  . EXCISION MASS LOWER EXTREMETIES Right 03/02/2018   Procedure: EXCISION SOFT TISSUE MASS FROM MEDIAL ASPECT OF RIGHT ANKLE;  Surgeon: [name], MD;  Location: [facility] ORS;  Service: Orthopedics;  Laterality: Right;  . FRACTURE SURGERY    . JOINT REPLACEMENT     right  . KNEE SURGERY Right   . LOWER EXTREMITY ANGIOGRAPHY Right 07/14/2019   Procedure: LOWER EXTREMITY ANGIOGRAPHY;  Surgeon: Algernon Huxley, MD;  Location: [facility] CV LAB;  Service: Cardiovascular;  Laterality: Right;  . MOUTH SURGERY Left 07/14/2017  . TOTAL KNEE ARTHROPLASTY Left 09/27/2019   Procedure: TOTAL KNEE ARTHROPLASTY;  Surgeon: [name], MD;  Location: [facility] ORS;  Service: Orthopedics;  Laterality: Left;     No outpatient medications have been marked as taking for the 07/02/20 encounter (Appointment) with Minna Merritts, MD.     Allergies:   Aspirin   Social History   Tobacco Use  . Smoking status: Former Smoker    Packs/day: 0.25    Years: 20.00    Pack years: 5.00    Types: Cigarettes    Quit date: 07/01/2006    Years since quitting: 14.0  . Smokeless tobacco: Never Used  Vaping Use  . Vaping Use: Never used  Substance Use Topics  . Alcohol use: No    Alcohol/week: 0.0 standard drinks  . Drug use: No     Family Hx: The patient's family history includes Cancer in her sister; Depression in her mother; Diabetes in her brother, mother, and another family member; Heart attack in her mother and sister; Heart disease in her mother; Heart failure in her mother and sister; Hypertension in her brother, mother, sister, and another family member; SIDS in her sister; Stroke in her mother.  ROS:   Please see the history of present illness.    Review of Systems  Constitutional: Negative.    Respiratory: Negative.   Cardiovascular: Negative.   Gastrointestinal: Negative.   Musculoskeletal: Positive for joint pain.  Neurological: Negative.   Psychiatric/Behavioral: Negative.   All other systems reviewed and are negative.    Labs/Other Tests and Data Reviewed:    Recent Labs: 07/22/2019: ALT 13 09/30/2019: BUN 14; Creatinine, Ser 1.05; Hemoglobin 8.9; Platelets 238; Potassium 3.8; Sodium 143   Recent Lipid Panel Lab Results  Component Value Date/Time   CHOL 120 01/30/2017 04:04 AM   TRIG 73 01/30/2017 04:04 AM   HDL 47 01/30/2017 04:04 AM   CHOLHDL 2.6 01/30/2017 04:04 AM   LDLCALC 58 01/30/2017 04:04 AM    Wt Readings from Last 3 Encounters:  06/11/20 250 lb (113.4 kg)  03/20/20 269 lb (122 kg)  01/11/20  267 lb (121.1 kg)     Exam:    Vital Signs: Vital signs may also be detailed in the HPI There were no vitals taken for this visit.   Constitutional:  oriented to person, place, and time. No distress.  Obese, in a wheelchair HENT:  Head: Grossly normal Eyes:  no discharge. No scleral icterus.  Neck: No JVD, no carotid bruits  Cardiovascular: Regular rate and rhythm, no murmurs appreciated Pulmonary/Chest: Clear to auscultation bilaterally, no wheezes or rails Abdominal: Soft.  no distension.  no tenderness.  Musculoskeletal: Normal range of motion Neurological:  normal muscle tone. Coordination normal. No atrophy Skin: Skin warm and dry Psychiatric: normal affect, pleasant   ASSESSMENT & PLAN:    Chronic diastolic congestive heart failure (HCC) Appears euvolemic, weight down to 266 and holding Blood work stable from June No changes to her medications  Type 2 diabetes mellitus without complication, with long-term current use of insulin (HCC) A1c monitored by primary care, weight down 50 pounds through dietary changes  Coronary artery disease of native artery of native heart with stable angina pectoris ([place]) Currently with no symptoms of angina. No  further workup at this time. Continue current medication regimen.  Essential hypertension Blood pressure is well controlled on today's visit. No changes made to the medications.  Morbid obesity ([place]) We have encouraged continued exercise, careful diet management in an effort to lose weight.  Centrilobular emphysema ([place]) Prior smoking, stopped years ago Reports breathing is stable Recommend continued weight loss   Total encounter time more than 25 minutes  Greater than 50% was spent in counseling and coordination of care with the patient    Signed, Ida Rogue, MD  07/01/2020 10:57 AM    [place] [facility] [address], [place], [place] [address]

## 2020-07-02 ENCOUNTER — Other Ambulatory Visit: Payer: Self-pay

## 2020-07-02 ENCOUNTER — Ambulatory Visit: Payer: Medicare HMO | Admitting: Cardiovascular Disease

## 2020-07-02 ENCOUNTER — Emergency Department
Admission: EM | Admit: 2020-07-02 | Discharge: 2020-07-02 | Disposition: A | Discharge status: Home / Self Care | Payer: Medicare HMO | Attending: Emergency Medicine | Admitting: Emergency Medicine

## 2020-07-02 DIAGNOSIS — I5032 Chronic diastolic (congestive) heart failure: Secondary | ICD-10-CM

## 2020-07-02 DIAGNOSIS — Z794 Long term (current) use of insulin: Secondary | ICD-10-CM | POA: Diagnosis not present

## 2020-07-02 DIAGNOSIS — I110 Hypertensive heart disease with heart failure: Secondary | ICD-10-CM | POA: Insufficient documentation

## 2020-07-02 DIAGNOSIS — Z951 Presence of aortocoronary bypass graft: Secondary | ICD-10-CM | POA: Diagnosis not present

## 2020-07-02 DIAGNOSIS — R12 Heartburn: Secondary | ICD-10-CM | POA: Diagnosis present

## 2020-07-02 DIAGNOSIS — E119 Type 2 diabetes mellitus without complications: Secondary | ICD-10-CM | POA: Insufficient documentation

## 2020-07-02 DIAGNOSIS — I5033 Acute on chronic diastolic (congestive) heart failure: Secondary | ICD-10-CM | POA: Diagnosis not present

## 2020-07-02 DIAGNOSIS — Z7902 Long term (current) use of antithrombotics/antiplatelets: Secondary | ICD-10-CM | POA: Diagnosis not present

## 2020-07-02 DIAGNOSIS — J441 Chronic obstructive pulmonary disease with (acute) exacerbation: Secondary | ICD-10-CM | POA: Insufficient documentation

## 2020-07-02 DIAGNOSIS — J449 Chronic obstructive pulmonary disease, unspecified: Secondary | ICD-10-CM

## 2020-07-02 DIAGNOSIS — Z96653 Presence of artificial knee joint, bilateral: Secondary | ICD-10-CM | POA: Insufficient documentation

## 2020-07-02 DIAGNOSIS — K219 Gastro-esophageal reflux disease without esophagitis: Secondary | ICD-10-CM | POA: Diagnosis not present

## 2020-07-02 DIAGNOSIS — Z955 Presence of coronary angioplasty implant and graft: Secondary | ICD-10-CM | POA: Insufficient documentation

## 2020-07-02 DIAGNOSIS — I10 Essential (primary) hypertension: Secondary | ICD-10-CM

## 2020-07-02 DIAGNOSIS — I25118 Atherosclerotic heart disease of native coronary artery with other forms of angina pectoris: Secondary | ICD-10-CM

## 2020-07-02 DIAGNOSIS — Z7984 Long term (current) use of oral hypoglycemic drugs: Secondary | ICD-10-CM | POA: Diagnosis not present

## 2020-07-02 DIAGNOSIS — G4733 Obstructive sleep apnea (adult) (pediatric): Secondary | ICD-10-CM

## 2020-07-02 DIAGNOSIS — Z87891 Personal history of nicotine dependence: Secondary | ICD-10-CM | POA: Insufficient documentation

## 2020-07-02 DIAGNOSIS — R0789 Other chest pain: Secondary | ICD-10-CM

## 2020-07-02 DIAGNOSIS — Z79899 Other long term (current) drug therapy: Secondary | ICD-10-CM | POA: Insufficient documentation

## 2020-07-02 DIAGNOSIS — I2510 Atherosclerotic heart disease of native coronary artery without angina pectoris: Secondary | ICD-10-CM | POA: Insufficient documentation

## 2020-07-02 DIAGNOSIS — Z7951 Long term (current) use of inhaled steroids: Secondary | ICD-10-CM | POA: Diagnosis not present

## 2020-07-02 LAB — CBC
HCT: 29.4 % — ABNORMAL LOW (ref 36.0–46.0)
Hemoglobin: 9.1 g/dL — ABNORMAL LOW (ref 12.0–15.0)
MCH: 25.9 pg — ABNORMAL LOW (ref 26.0–34.0)
MCHC: 31 g/dL (ref 30.0–36.0)
MCV: 83.8 fL (ref 80.0–100.0)
Platelets: 234 10*3/uL (ref 150–400)
RBC: 3.51 MIL/uL — ABNORMAL LOW (ref 3.87–5.11)
RDW: 15.6 % — ABNORMAL HIGH (ref 11.5–15.5)
WBC: 5.5 10*3/uL (ref 4.0–10.5)
nRBC: 0 % (ref 0.0–0.2)

## 2020-07-02 LAB — COMPREHENSIVE METABOLIC PANEL
ALT: 14 U/L (ref 0–44)
AST: 15 U/L (ref 15–41)
Albumin: 3.7 g/dL (ref 3.5–5.0)
Alkaline Phosphatase: 127 U/L — ABNORMAL HIGH (ref 38–126)
Anion gap: 8 (ref 5–15)
BUN: 21 mg/dL (ref 8–23)
CO2: 23 mmol/L (ref 22–32)
Calcium: 9.3 mg/dL (ref 8.9–10.3)
Chloride: 111 mmol/L (ref 98–111)
Creatinine, Ser: 0.77 mg/dL (ref 0.44–1.00)
GFR, Estimated: >60 mL/min (ref >60)
Glucose, Bld: 78 mg/dL (ref 70–99)
Potassium: 3.3 mmol/L — ABNORMAL LOW (ref 3.5–5.1)
Sodium: 142 mmol/L (ref 135–145)
Total Bilirubin: 0.6 mg/dL (ref 0.3–1.2)
Total Protein: 7 g/dL (ref 6.5–8.1)

## 2020-07-02 LAB — TROPONIN I (HIGH SENSITIVITY): Troponin I (High Sensitivity): 9 ng/L (ref <18)

## 2020-07-02 LAB — LIPASE, BLOOD: Lipase: 25 U/L (ref 11–51)

## 2020-07-02 MED ORDER — SUCRALFATE 1 G PO TABS
1.0000 g | ORAL_TABLET | Freq: Once | ORAL | Status: AC
  Administered 2020-07-02: 1 g via ORAL
  Filled 2020-07-02: qty 1

## 2020-07-02 MED ORDER — PANTOPRAZOLE SODIUM 40 MG PO TBEC
80.0000 mg | DELAYED_RELEASE_TABLET | Freq: Every day | ORAL | Status: DC
  Administered 2020-07-02: 80 mg via ORAL
  Filled 2020-07-02: qty 2

## 2020-07-02 MED ORDER — ESOMEPRAZOLE MAGNESIUM 40 MG PO CPDR: 40.0000 mg | DELAYED_RELEASE_CAPSULE | Freq: Two times a day (BID) | ORAL | 0 refills | Status: DC

## 2020-07-02 NOTE — ED Triage Notes (Signed)
Pt in with co chest pain since yesterday states feels like reflux. Pt states ran out of reflux meds 3 days ago. Pt also has hx of bypass, states did have a fever last night but states has improved today.

## 2020-07-02 NOTE — ED Provider Notes (Signed)
[facility] Emergency Department Provider Note   ____________________________________________   Event Date/Time   First MD Initiated Contact with Patient 07/02/20 2018     (approximate)  I have reviewed the triage vital signs and the nursing notes.   HISTORY  Chief Complaint Chest Pain    HPI Tina Hayes is a 75 y.o. female with a stated past medical history of heart failure, COPD, CAD, type 2 diabetes, and GERD who presents for burning substernal chest pain that began 3 days prior to arrival after she ran out of her home Nexium.  Patient states that she has been calling both her pharmacy as well as her primary care doctor for refill of this medication but has not been able to get 1.  Patient denies any associated shortness of breath.  Patient denies any exertional component to this chest pain.  Patient currently denies any vision changes, tinnitus, difficulty speaking, facial droop, sore throat, shortness of breath, abdominal pain, nausea/vomiting/diarrhea, dysuria, or weakness/numbness/paresthesias in any extremity         Past Medical History:  Diagnosis Date  . (HFpEF) heart failure with preserved ejection fraction ([place])    a. 05/2016 Echo: EF 60-65%, mild to mod LVH, Gr1 DD, mild MR, mildly dil LA, mod TR, mildly to mod increased PASP.  [name] Acute diastolic heart failure ([place]) 01/27/2017  . Anxiety   . Arthritis   . Chest pain 06/16/2016  . CHF (congestive heart failure) ([place])   . Chronic back pain   . Chronic diastolic congestive heart failure ([place]) 02/13/2017  . COPD (chronic obstructive pulmonary disease) ([place])   . Coronary artery disease    a. s/p remote PCI x 5;  b. 2006 s/p CABG x 3 ([place], [place]); b. 05/2016 MV: attenuation corrected images w/o ischemia or wma-->Med rx.  . Coronary artery disease of native artery of native heart with stable angina pectoris ([place]) 06/17/2016  . Depression   . Diabetes mellitus  without complication ([place])   . Essential hypertension 06/30/2016  . GERD (gastroesophageal reflux disease)   . Heart attack ([place])    Total of 3 per pt.  . Hypertension   . Hypertensive urgency 06/03/2015  . Seizure ([place])   . Seizures [facility])     Patient Active Problem List   Diagnosis Date Noted  . Status post total knee replacement using cement, 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  . Atherosclerosis of native arteries of the extremities with ulceration ([place]) 07/05/2019  . Onychomycosis 05/30/2019  . Occlusion and stenosis of vertebral artery 08/31/2018  . AKI (acute kidney injury) ([place]) 05/25/2018  . BMI 50.0-59.9, adult ([place]) 04/16/2018  . Seizures ([place]) 11/10/2017  . Mass of right lower leg 09/11/2017  . Dysphagia 04/23/2017  . Chronic diastolic congestive heart failure ([place]) 02/13/2017  . Acute diastolic heart failure ([place]) 01/27/2017  . Depression 12/11/2016  . Essential hypertension 06/30/2016  . Snoring 06/30/2016  . Diabetes mellitus ([place]) 06/30/2016  . Coronary artery disease of native artery of native heart with stable angina pectoris ([place]) 06/17/2016  . Morbid obesity ([place]) 06/17/2016  . COPD (chronic obstructive pulmonary disease) ([place]) 08/28/2015  . COPD with acute exacerbation ([place]) 04/14/2015    Past Surgical History:  Procedure Laterality Date  . ABDOMINAL HYSTERECTOMY    . ANKLE SURGERY Right   . CATARACT EXTRACTION W/ INTRAOCULAR LENS  IMPLANT, BILATERAL    . COLONOSCOPY WITH PROPOFOL N/A 05/04/2017   Procedure: COLONOSCOPY WITH PROPOFOL;  Surgeon: [name],  MD;  Location: [facility] ENDOSCOPY;  Service: Endoscopy;  Laterality: N/A;  . CORONARY ANGIOPLASTY    . CORONARY ARTERY BYPASS GRAFT     TRIPLE BYPASS  . ESOPHAGOGASTRODUODENOSCOPY (EGD) WITH PROPOFOL N/A 05/04/2017   Procedure: ESOPHAGOGASTRODUODENOSCOPY (EGD) WITH PROPOFOL;  Surgeon: [name], MD;  Location: [facility] ENDOSCOPY;  Service: Endoscopy;  Laterality: N/A;  . EXCISION MASS LOWER EXTREMETIES Right 03/02/2018    Procedure: EXCISION SOFT TISSUE MASS FROM MEDIAL ASPECT OF RIGHT ANKLE;  Surgeon: [name], MD;  Location: [facility] ORS;  Service: Orthopedics;  Laterality: Right;  . FRACTURE SURGERY    . JOINT REPLACEMENT     right  . KNEE SURGERY Right   . LOWER EXTREMITY ANGIOGRAPHY Right 07/14/2019   Procedure: LOWER EXTREMITY ANGIOGRAPHY;  Surgeon: Algernon Huxley, MD;  Location: [place] CV LAB;  Service: Cardiovascular;  Laterality: Right;  . MOUTH SURGERY Left 07/14/2017  . TOTAL KNEE ARTHROPLASTY Left 09/27/2019   Procedure: TOTAL KNEE ARTHROPLASTY;  Surgeon: [name], MD;  Location: [facility] ORS;  Service: Orthopedics;  Laterality: Left;    Prior to Admission medications   Medication Sig Start Date End Date Taking? Authorizing Provider  albuterol (PROVENTIL HFA;VENTOLIN HFA) 108 (90 Base) MCG/ACT inhaler Inhale 2 puffs into the lungs every 6 (six) hours as needed for wheezing or shortness of breath. 08/30/15   Epifanio Lesches, MD  albuterol (PROVENTIL) (2.5 MG/3ML) 0.083% nebulizer solution Take 2.5 mg every 4 (four) hours as needed by nebulization for wheezing or shortness of breath.    [provider]  amLODipine (NORVASC) 5 MG tablet Take 1 tablet (5 mg total) by mouth daily. 06/29/20 09/27/20  Marrianne Mood D, PA-C  atorvastatin (LIPITOR) 40 MG tablet Take 1 tablet (40 mg total) by mouth daily. 01/16/20   Minna Merritts, MD  B-D UF III MINI PEN NEEDLES 31G X 5 MM MISC USE WITH INSULIN PEN INJECTIONS TWICE DAILY 08/05/18   [provider]  budesonide (PULMICORT) 0.5 MG/2ML nebulizer solution Take 0.5 mg by nebulization daily at 12 noon.     [provider]  carvedilol (COREG) 3.125 MG tablet Take 1 tablet (3.125 mg total) by mouth daily. Take 1 tablet (3.125 mg) by mouth once a day 05/04/20   Minna Merritts, MD  clopidogrel (PLAVIX) 75 MG tablet Take 1 tablet (75 mg total) by mouth daily. 05/19/19   Darylene Price A, FNP  COMBIVENT RESPIMAT 20-100 MCG/ACT AERS  respimat Inhale 1 puff into the lungs 4 (four) times daily as needed for wheezing.  07/30/18   [provider]  Dupilumab (DUPIXENT) 300 MG/2ML SOPN Inject 300 mg into the skin every 14 (fourteen) days.     [provider]  empagliflozin (JARDIANCE) 10 MG TABS tablet Take 10 mg by mouth daily.    [provider]  esomeprazole (NEXIUM) 40 MG capsule Take 1 capsule (40 mg total) by mouth 2 (two) times daily. 07/02/20   Naaman Plummer, MD  ferrous sulfate 325 (65 FE) MG tablet Take 325 mg by mouth daily.    [provider]  HYDROcodone-acetaminophen (NORCO) 7.5-325 MG tablet Take 1 tablet by mouth every 6 (six) hours as needed for moderate pain or severe pain. 07/06/20 08/05/20  Molli Barrows, MD  HYDROcodone-acetaminophen (NORCO) 7.5-325 MG tablet Take 1 tablet by mouth every 6 (six) hours as needed for moderate pain or severe pain. 08/05/20 09/04/20  Molli Barrows, MD  isosorbide mononitrate (IMDUR) 30 MG 24 hr tablet Take  1 tablet by mouth twice daily 05/07/20   Minna Merritts, MD  LANTUS SOLOSTAR 100 UNIT/ML Solostar Pen Inject 30-40 Units into the skin See admin instructions. Inject 30 units subcutaneously in the morning at 40 units subcutaneously at bedtime. 05/26/19   [provider]  levETIRAcetam (KEPPRA) 750 MG tablet Take 750 mg by mouth 2 (two) times daily.    [provider]  losartan (COZAAR) 100 MG tablet Take 1 tablet (100 mg total) by mouth daily. 08/17/18   Minna Merritts, MD  meloxicam (MOBIC) 7.5 MG tablet Take 7.5 mg by mouth daily as needed for pain.     [provider]  metolazone (ZAROXOLYN) 2.5 MG tablet Take 1 tablet (2.5 mg total) by mouth daily. 1/2 hour prior to torsemide 12/26/19 03/25/20  Darylene Price A, FNP  metoprolol tartrate (LOPRESSOR) 25 MG tablet Take 1 tablet (25 mg total) by mouth 2 (two) times daily as needed. Only take when needed for fast or high heart rate, may take two times a day AS NEEDED (not daily)  05/04/20   Gollan, Kathlene November, MD  mometasone-formoterol (DULERA) 200-5 MCG/ACT AERO Inhale 2 puffs into the lungs 2 (two) times daily. 02/03/19   Vaughan Basta, MD  montelukast (SINGULAIR) 10 MG tablet Take 10 mg by mouth daily.  11/05/17   [provider]  nitroGLYCERIN (NITROSTAT) 0.4 MG SL tablet DISSOLVE ONE TABLET UNDER THE TONGUE EVERY 5 MINUTES AS NEEDED FOR CHEST PAIN.  DO NOT EXCEED A TOTAL OF 3 DOSES IN 15 MINUTES Patient taking differently: Place 0.4 mg under the tongue every 5 (five) minutes as needed for chest pain. 07/09/18   Gollan, Kathlene November, MD  NOVOLOG FLEXPEN 100 UNIT/ML FlexPen Inject 10 Units into the skin 3 (three) times daily with meals. 06/07/19   [provider]  potassium chloride SA (KLOR-CON) 20 MEQ tablet Take 2 tablets (40 mEq total) by mouth 2 (two) times daily. Take extra 2 tablets (40 meq) if you take metolazone 12/13/19   Darylene Price A, FNP  rOPINIRole (REQUIP) 1 MG tablet Take 1 mg by mouth at bedtime.     [provider]  sertraline (ZOLOFT) 100 MG tablet Take 100 mg by mouth at bedtime. 04/18/19   [provider]  tiotropium (SPIRIVA) 18 MCG inhalation capsule Place 18 mcg into inhaler and inhale daily.    [provider]  torsemide (DEMADEX) 20 MG tablet Take 3 tablets (60 mg total) by mouth 2 (two) times daily. 60mg  (take 3 tabs) by mouth two times a day 05/04/20   Minna Merritts, MD  traMADol (ULTRAM) 50 MG tablet Take 1 tablet (50 mg total) by mouth every 6 (six) hours. Patient not taking: Reported on 06/11/2020 09/30/19   Lattie Corns, PA-C  traZODone (DESYREL) 50 MG tablet Take 50 mg by mouth at bedtime.    [provider]    Allergies Aspirin  Family History  Problem Relation Age of Onset  . Diabetes Other   . Hypertension Other   . Diabetes Mother   . Heart failure Mother   . Heart disease Mother   . Heart attack Mother   . Stroke Mother   . Depression Mother   . Hypertension Mother    . Cancer Sister        brain  . Hypertension Sister   . Diabetes Brother   . Hypertension Brother   . Heart failure Sister   . Heart attack Sister   . SIDS  Sister     Social History Social History   Tobacco Use  . Smoking status: Former Smoker    Packs/day: 0.25    Years: 20.00    Pack years: 5.00    Types: Cigarettes    Quit date: 07/01/2006    Years since quitting: 14.0  . Smokeless tobacco: Never Used  Vaping Use  . Vaping Use: Never used  Substance Use Topics  . Alcohol use: No    Alcohol/week: 0.0 standard drinks  . Drug use: No    Review of Systems Constitutional: No fever/chills Eyes: No visual changes. ENT: No sore throat. Cardiovascular: Endorses chest pain. Respiratory: Denies shortness of breath. Gastrointestinal: No abdominal pain.  No nausea, no vomiting.  No diarrhea. Genitourinary: Negative for dysuria. Musculoskeletal: Negative for acute arthralgias Skin: Negative for rash. Neurological: Negative for headaches, weakness/numbness/paresthesias in any extremity Psychiatric: Negative for suicidal ideation/homicidal ideation   ____________________________________________   PHYSICAL EXAM:  VITAL SIGNS: ED Triage Vitals  Enc Vitals Group     BP 07/02/20 1917 (!) 147/100     Pulse Rate 07/02/20 1917 (!) 58     Resp 07/02/20 1917 20     Temp 07/02/20 1917 98.8 F (37.1 C)     Temp Source 07/02/20 1917 Oral     SpO2 07/02/20 1911 100 %     Weight 07/02/20 1917 260 lb (117.9 kg)     Height 07/02/20 1917 5\' 11"  (1.803 m)     Head Circumference --      Peak Flow --      Pain Score 07/02/20 1917 5     Pain Loc --      Pain Edu? --      Excl. in [place]? --    Constitutional: Alert and oriented. Well appearing obese female in no acute distress. Eyes: Conjunctivae are normal. PERRL. Head: Atraumatic. Nose: No congestion/rhinnorhea. Mouth/Throat: Mucous membranes are moist. Neck: No stridor Cardiovascular: Grossly normal heart sounds.  Good  peripheral circulation. Respiratory: Normal respiratory effort.  No retractions. Gastrointestinal: Soft and nontender. No distention. Musculoskeletal: No obvious deformities Neurologic:  Normal speech and language. No gross focal neurologic deficits are appreciated. Skin:  Skin is warm and dry. No rash noted. Psychiatric: Mood and affect are normal. Speech and behavior are normal.  ____________________________________________   LABS (all labs ordered are listed, but only abnormal results are displayed)  Labs Reviewed  CBC - Abnormal; Notable for the following components:      Result Value   RBC 3.51 (*)    Hemoglobin 9.1 (*)    HCT 29.4 (*)    MCH 25.9 (*)    RDW 15.6 (*)    All other components within normal limits  COMPREHENSIVE METABOLIC PANEL - Abnormal; Notable for the following components:   Potassium 3.3 (*)    Alkaline Phosphatase 127 (*)    All other components within normal limits  LIPASE, BLOOD  TROPONIN I (HIGH SENSITIVITY)   ____________________________________________  EKG  ED ECG REPORT I, Naaman Plummer, the attending physician, personally viewed and interpreted this ECG.  Date: 07/02/2020 EKG Time: 1922 Rate: 56 Rhythm: Bradycardic sinus rhythm QRS Axis: normal Intervals: normal ST/T Wave abnormalities: normal Narrative Interpretation: no evidence of acute ischemia  PROCEDURES  Procedure(s) performed (including Critical Care):  .1-3 Lead EKG Interpretation Performed by: Naaman Plummer, MD Authorized by: Naaman Plummer, MD     Interpretation: normal     ECG rate:  60   ECG rate assessment: normal  Rhythm: sinus rhythm     Ectopy: none     Conduction: normal       ____________________________________________   INITIAL IMPRESSION / ASSESSMENT AND PLAN / ED COURSE  As part of my medical decision making, I reviewed the following data within the [place] notes reviewed and incorporated, Labs reviewed, EKG  interpreted, Old chart reviewed, and Notes from prior ED visits reviewed and incorporated        Workup: ECG, CXR, CBC, BMP, Troponin Findings: ECG: No overt evidence of STEMI. No evidence of Brugadas sign, delta wave, epsilon wave, significantly prolonged QTc, or malignant arrhythmia HS Troponin: Negative x1 Other Labs unremarkable for emergent problems. CXR: Without PTX, PNA, or widened mediastinum Last Stress Test:  2017 Last Heart Catheterization:  2017 HEART Score: 4  Given History, Exam, and Workup I have low suspicion for ACS, Pneumothorax, Pneumonia, Pulmonary Embolus, Tamponade, Aortic Dissection or other emergent problem as a cause for this presentation.   Reassesment: Prior to discharge patients pain was controlled and they were well appearing.  Disposition:  Discharge. Strict return precautions discussed with patient with full understanding. Advised patient to follow up promptly with primary care provider      ____________________________________________   FINAL CLINICAL IMPRESSION(S) / ED DIAGNOSES  Final diagnoses:  Other chest pain  Gastroesophageal reflux disease without esophagitis     ED Discharge Orders         Ordered    esomeprazole (NEXIUM) 40 MG capsule  2 times daily        07/02/20 2041           Note:  This document was prepared using Dragon voice recognition software and may include unintentional dictation errors.   Naaman Plummer, MD 07/02/20 [phone]

## 2020-07-02 NOTE — ED Triage Notes (Signed)
EMS brings pt in from home for epigastric pain since yesterday; "out of reflux meds"

## 2020-07-03 ENCOUNTER — Encounter: Payer: Self-pay | Admitting: Cardiovascular Disease

## 2020-07-09 ENCOUNTER — Telehealth: Payer: Self-pay | Admitting: Cardiovascular Disease

## 2020-07-09 NOTE — Telephone Encounter (Signed)
STAT if patient feels like he/she is going to faint   1) Are you dizzy now? Very dizzy   2) Do you feel faint or have you passed out? When she goes to get up   3) Do you have any other symptoms? Very lightheaded, headache   4) Have you checked your HR and BP (record if available)? No readings

## 2020-07-09 NOTE — Telephone Encounter (Signed)
Call transferred to me. Patient states she started having lightheadedness yesterday. Yesterday she was able to get up and move around but today she is so dizzy she is afraid she is going to fall. States she is drinking plenty of water, Gatorade and tea. Does not have a way to check her BP, though states PCP said it was good on Friday, 3/11.  Was taking metoprolol at night time as needed for "racing heart". States Dr Rockey Situ said she could do that. We reviewed her heart medications and she says she is taking amlodipine, isosorbide, carvedilol, clopidogrel, losartan, torsemide, potassium and metolazone. Says she sometimes does not take one or more of them when she "knows" she does not need it that day.  PCP, Dr Kathrine Haddock (who is covering for Dr Thurnell Garbe) called her this morning to check in. Last Friday, patient saw him and was prescribed an antibiotic for something with her stomach.  She relies on transportation service and they brought her too later for appointment 1 week ago with Dr Rockey Situ. She needs 3 days notice for appointment for transportation. Therefore, she cannot come today or tomorrow. Scheduled her for 07/13/20.  Advised patient to call her PCP back to let them know what's going on still. Her Hgb was 9.1 about 2 weeks ago for GI bleed. Patient is also going to call [facility] to send a nurse out today to take VS and assess. Advised patient that if her dizziness did not improve to call EMS/911 for evaluation as she may need to go to the ER.  She verbalized understanding and will call our office back if symptoms get worse as well.

## 2020-07-11 NOTE — Telephone Encounter (Signed)
Best thing would probably be the home health nurse Unclear what is going on

## 2020-07-13 ENCOUNTER — Ambulatory Visit (INDEPENDENT_AMBULATORY_CARE_PROVIDER_SITE_OTHER): Payer: Medicare HMO | Admitting: Medical

## 2020-07-13 ENCOUNTER — Other Ambulatory Visit: Payer: Self-pay

## 2020-07-13 ENCOUNTER — Ambulatory Visit (INDEPENDENT_AMBULATORY_CARE_PROVIDER_SITE_OTHER): Payer: Medicare HMO

## 2020-07-13 ENCOUNTER — Encounter: Payer: Self-pay | Admitting: Medical

## 2020-07-13 VITALS — BP 136/71 | HR 49 | Ht 71.0 in | Wt 266.0 lb

## 2020-07-13 DIAGNOSIS — R001 Bradycardia, unspecified: Secondary | ICD-10-CM

## 2020-07-13 DIAGNOSIS — I5032 Chronic diastolic (congestive) heart failure: Secondary | ICD-10-CM

## 2020-07-13 DIAGNOSIS — R079 Chest pain, unspecified: Secondary | ICD-10-CM

## 2020-07-13 DIAGNOSIS — Z794 Long term (current) use of insulin: Secondary | ICD-10-CM

## 2020-07-13 DIAGNOSIS — E119 Type 2 diabetes mellitus without complications: Secondary | ICD-10-CM

## 2020-07-13 DIAGNOSIS — Z951 Presence of aortocoronary bypass graft: Secondary | ICD-10-CM

## 2020-07-13 DIAGNOSIS — I10 Essential (primary) hypertension: Secondary | ICD-10-CM

## 2020-07-13 DIAGNOSIS — R42 Dizziness and giddiness: Secondary | ICD-10-CM | POA: Diagnosis not present

## 2020-07-13 DIAGNOSIS — I340 Nonrheumatic mitral (valve) insufficiency: Secondary | ICD-10-CM

## 2020-07-13 DIAGNOSIS — I25118 Atherosclerotic heart disease of native coronary artery with other forms of angina pectoris: Secondary | ICD-10-CM

## 2020-07-13 NOTE — Progress Notes (Signed)
Cardiology Office Note:    Date:  07/13/2020   ID:  Tina Hayes, DOB 16-Apr-1946, MRN [id number]  PCP:  Herminio Commons, MD  [facility] Cardiologist:  Ida Rogue, MD  [facility] Electrophysiologist:  None   Referring MD: Herminio Commons, MD   Chief Complaint: dizziness  History of Present Illness:    Tina Hayes is a 75 y.o. 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   The patient was seen in 2018 for intermittent chest and shoulder pain and tenderness for many months, which turned out to be GI issues. since then, she has been followed regularly by our office.   Echo 01/2019 showed EF 60%, right heart pressures moderately dilated, moderate MR.  Last seen 01/03/20 and stable from a cardiac standpoint. No changes were made.   The patient was seen in the ER on 3/7 for chest pain, low suspicion for ACS. She had run out of her PPI. Cardiac work-up negative, suspected Acid relfux and Nexium was refilled.   History is very hard to obtain given unclear story. Today, the patient reports she has been lightheadedness and dizziness. She feels the room is spinning. Symptoms started 3 days ago. When she went to stand up she would get very lightheaded and dizzy. The next day she had to hold on to something when she got up. Symptoms occur every time she stands up. It is not worse. Also on day 2 had chest discomfort similar to acid-reflux. She has been taking the Nexium. Centralized pressure and couldn't swallow. Tried to eat and couldn't, food would come back. Chest discomfort not worse with exertion.   She has been drinking and eating normally, although first meal is at 12pm.  Staying well hydrated. Checks sugars and says they are normal, generally run 75 in the morning. No recent  nausea, vomiting, fever, chills. Has lower leg edema that is stable. Weights have been running the same. Has chronic orthopnea that is unchanged. Last week she was started on ondansetron for nasuea and vomiting. This helps nausea. Started last Friday. Also started methocarbomal, which apparently can cause dizziness and lightheadedness. Orthostatics today are negative. BP a little high, but has not had medications.   Antihypertensives include Amlodipine 5 mg daily, Coreg 3.125mg  BID, Losartan 100mg  daily, Lopressor 25mg  BID, Torsemide 20 mg daily  Orthostatics: Lying 136/71, 49bpm Sitting 156/74, 50bpm Standing 166/69, 49bpm Standing 3 minutes 158/68, 49bpm    Past Medical History:  Diagnosis Date  . (HFpEF) heart failure with preserved ejection fraction ([place])    a. 05/2016 Echo: EF 60-65%, mild to mod LVH, Gr1 DD, mild MR, mildly dil LA, mod TR, mildly to mod increased PASP.  [name] Acute diastolic heart failure ([place]) 01/27/2017  . Anxiety   . Arthritis   . Chest pain 06/16/2016  . CHF (congestive heart failure) ([place])   . Chronic back pain   . Chronic diastolic congestive heart failure ([place]) 02/13/2017  . COPD (chronic obstructive pulmonary disease) ([place])   . Coronary artery disease    a. s/p remote PCI x 5;  b. 2006 s/p CABG x 3 ([place], [place]); b. 05/2016 MV: attenuation corrected images w/o ischemia or wma-->Med rx.  . Coronary artery disease of native artery of native heart with stable angina  pectoris ([place]) 06/17/2016  . Depression   . Diabetes mellitus without complication ([place])   . Essential hypertension 06/30/2016  . GERD (gastroesophageal reflux disease)   . Heart attack ([place])    Total of 3 per pt.  . Hypertension   . Hypertensive urgency 06/03/2015  . Seizure ([place])   . Seizures ([place])     Past Surgical History:  Procedure Laterality Date  . ABDOMINAL HYSTERECTOMY    . ANKLE SURGERY Right   . CATARACT EXTRACTION W/ INTRAOCULAR LENS  IMPLANT, BILATERAL     . COLONOSCOPY WITH PROPOFOL N/A 05/04/2017   Procedure: COLONOSCOPY WITH PROPOFOL;  Surgeon: [name], MD;  Location: [facility] ENDOSCOPY;  Service: Endoscopy;  Laterality: N/A;  . CORONARY ANGIOPLASTY    . CORONARY ARTERY BYPASS GRAFT     TRIPLE BYPASS  . ESOPHAGOGASTRODUODENOSCOPY (EGD) WITH PROPOFOL N/A 05/04/2017   Procedure: ESOPHAGOGASTRODUODENOSCOPY (EGD) WITH PROPOFOL;  Surgeon: [name], MD;  Location: [facility] ENDOSCOPY;  Service: Endoscopy;  Laterality: N/A;  . EXCISION MASS LOWER EXTREMETIES Right 03/02/2018   Procedure: EXCISION SOFT TISSUE MASS FROM MEDIAL ASPECT OF RIGHT ANKLE;  Surgeon: [name], MD;  Location: [facility] ORS;  Service: Orthopedics;  Laterality: Right;  . FRACTURE SURGERY    . JOINT REPLACEMENT     right  . KNEE SURGERY Right   . LOWER EXTREMITY ANGIOGRAPHY Right 07/14/2019   Procedure: LOWER EXTREMITY ANGIOGRAPHY;  Surgeon: Algernon Huxley, MD;  Location: [facility] CV LAB;  Service: Cardiovascular;  Laterality: Right;  . MOUTH SURGERY Left 07/14/2017  . TOTAL KNEE ARTHROPLASTY Left 09/27/2019   Procedure: TOTAL KNEE ARTHROPLASTY;  Surgeon: [name], MD;  Location: [facility] ORS;  Service: Orthopedics;  Laterality: Left;    Current Medications: Current Meds  Medication Sig  . albuterol (PROVENTIL HFA;VENTOLIN HFA) 108 (90 Base) MCG/ACT inhaler Inhale 2 puffs into the lungs every 6 (six) hours as needed for wheezing or shortness of breath.  [name] albuterol (PROVENTIL) (2.5 MG/3ML) 0.083% nebulizer solution Take 2.5 mg every 4 (four) hours as needed by nebulization for wheezing or shortness of breath.  [name] amLODipine (NORVASC) 5 MG tablet Take 1 tablet (5 mg total) by mouth daily.  [name] atorvastatin (LIPITOR) 40 MG tablet Take 1 tablet (40 mg total) by mouth daily.  . B-D UF III MINI PEN NEEDLES 31G X 5 MM MISC USE WITH INSULIN PEN INJECTIONS TWICE DAILY  . clopidogrel (PLAVIX) 75 MG tablet Take 1 tablet (75 mg total) by mouth daily.  . Dupilumab (DUPIXENT) 300  MG/2ML SOPN Inject 300 mg into the skin every 14 (fourteen) days.   [name] esomeprazole (NEXIUM) 40 MG capsule Take 1 capsule (40 mg total) by mouth 2 (two) times daily.  . ferrous sulfate 325 (65 FE) MG tablet Take 325 mg by mouth daily.  [name] HYDROcodone-acetaminophen (NORCO) 7.5-325 MG tablet Take 1 tablet by mouth every 6 (six) hours as needed for moderate pain or severe pain.  . isosorbide mononitrate (IMDUR) 30 MG 24 hr tablet Take 1 tablet by mouth twice daily  . LANTUS SOLOSTAR 100 UNIT/ML Solostar Pen Inject 30-40 Units into the skin See admin instructions. Inject 30 units subcutaneously in the morning at 40 units subcutaneously at bedtime.  . levETIRAcetam (KEPPRA) 750 MG tablet Take 750 mg by mouth 2 (two) times daily.  [name] losartan (COZAAR) 100 MG tablet Take 1 tablet (100 mg total) by mouth daily.  . meloxicam (MOBIC) 7.5 MG tablet Take 7.5 mg by mouth daily as needed  for pain.   . methocarbamol (ROBAXIN) 500 MG tablet Take 500 mg by mouth daily as needed.  . montelukast (SINGULAIR) 10 MG tablet Take 10 mg by mouth daily.   . nitroGLYCERIN (NITROSTAT) 0.4 MG SL tablet DISSOLVE ONE TABLET UNDER THE TONGUE EVERY 5 MINUTES AS NEEDED FOR CHEST PAIN.  DO NOT EXCEED A TOTAL OF 3 DOSES IN 15 MINUTES (Patient taking differently: Place 0.4 mg under the tongue every 5 (five) minutes as needed for chest pain.)  . NOVOLOG FLEXPEN 100 UNIT/ML FlexPen Inject 10 Units into the skin 3 (three) times daily with meals.  . ondansetron (ZOFRAN) 4 MG tablet Take 4 mg by mouth as needed.  . potassium chloride SA (KLOR-CON) 20 MEQ tablet Take 2 tablets (40 mEq total) by mouth 2 (two) times daily. Take extra 2 tablets (40 meq) if you take metolazone  . sertraline (ZOLOFT) 100 MG tablet Take 100 mg by mouth at bedtime.  [name] tiotropium (SPIRIVA) 18 MCG inhalation capsule Place 18 mcg into inhaler and inhale daily.  [name] torsemide (DEMADEX) 20 MG tablet Take 3 tablets (60 mg total) by mouth 2 (two) times daily. 60mg  (take 3  tabs) by mouth two times a day  . traZODone (DESYREL) 50 MG tablet Take 50 mg by mouth at bedtime.  . [DISCONTINUED] carvedilol (COREG) 3.125 MG tablet Take 1 tablet (3.125 mg total) by mouth daily. Take 1 tablet (3.125 mg) by mouth once a day  . [DISCONTINUED] metoprolol tartrate (LOPRESSOR) 25 MG tablet Take 1 tablet (25 mg total) by mouth 2 (two) times daily as needed. Only take when needed for fast or high heart rate, may take two times a day AS NEEDED (not daily)     Allergies:   Aspirin   Social History   Socioeconomic History  . Marital status: Widowed    Spouse name: Not on file  . Number of children: Not on file  . Years of education: Not on file  . Highest education level: Not on file  Occupational History  . Not on file  Tobacco Use  . Smoking status: Former Smoker    Packs/day: 0.25    Years: 20.00    Pack years: 5.00    Types: Cigarettes    Quit date: 07/01/2006    Years since quitting: 14.0  . Smokeless tobacco: Never Used  Vaping Use  . Vaping Use: Never used  Substance and Sexual Activity  . Alcohol use: No    Alcohol/week: 0.0 standard drinks  . Drug use: No  . Sexual activity: Yes  Other Topics Concern  . Not on file  Social History Narrative   Lives locally.  Does not routinely exercise.   Social Determinants of Health   Financial Resource Strain: Not on file  Food Insecurity: Not on file  Transportation Needs: Not on file  Physical Activity: Not on file  Stress: Not on file  Social Connections: Not on file     Family History: The patient's family history includes Cancer in her sister; Depression in her mother; Diabetes in her brother, mother, and another family member; Heart attack in her mother and sister; Heart disease in her mother; Heart failure in her mother and sister; Hypertension in her brother, mother, sister, and another family member; SIDS in her sister; Stroke in her mother.  ROS:   Please see the history of present illness.     All  other systems reviewed and are negative.  EKGs/Labs/Other Studies Reviewed:    The following  studies were reviewed today:  Echo 2020  1. Left ventricular ejection fraction, by visual estimation, is 60 to  65%. The left ventricle has normal function. Normal left ventricular size.  There is mildly increased left ventricular hypertrophy.  2. Left ventricular diastolic Doppler parameters are consistent with  impaired relaxation pattern of LV diastolic filling.  3. Global right ventricle has normal systolic function.The right  ventricular size is mildly enlarged. No increase in right ventricular wall  thickness.  4. Left atrial size was mildly dilated.  5. Moderate mitral valve regurgitation.  6. The tricuspid valve is normal in structure. Tricuspid valve  regurgitation moderate.  7. Moderately elevated pulmonary artery systolic pressure.  8. The tricuspid regurgitant velocity is 2.83 m/s, and with an assumed  right atrial pressure of 15 mmHg, the estimated right ventricular systolic  pressure is moderately elevated at 47.0   EKG:  EKG is  ordered today.  The ekg ordered today demonstrates SB, 47bpm, nonspecific T wave changes  Recent Labs: 07/02/2020: ALT 14; BUN 21; Creatinine, Ser 0.77; Hemoglobin 9.1; Platelets 234; Potassium 3.3; Sodium 142  Recent Lipid Panel    Component Value Date/Time   CHOL 120 01/30/2017 0404   TRIG 73 01/30/2017 0404   HDL 47 01/30/2017 0404   CHOLHDL 2.6 01/30/2017 0404   VLDL 15 01/30/2017 0404   LDLCALC 58 01/30/2017 0404    Physical Exam:    VS:  BP 136/71   Pulse (!) 49   Ht 5\' 11"  (1.803 m)   Wt 266 lb (120.7 kg)   BMI 37.10 kg/m     Wt Readings from Last 3 Encounters:  07/13/20 266 lb (120.7 kg)  07/02/20 260 lb (117.9 kg)  06/11/20 250 lb (113.4 kg)     GEN:  Well nourished, well developed in no acute distress HEENT: Normal NECK: No JVD; No carotid bruits LYMPHATICS: No lymphadenopathy CARDIAC: SB, no murmurs, rubs,  gallops RESPIRATORY:  Clear to auscultation without rales, wheezing or rhonchi  ABDOMEN: Soft, non-tender, non-distended MUSCULOSKELETAL:  No edema; No deformity  SKIN: Warm and dry NEUROLOGIC:  Alert and oriented x 3 PSYCHIATRIC:  Normal affect   ASSESSMENT:    1. Chronic diastolic congestive heart failure ([place])   2. Hx of CABG   3. Essential hypertension   4. Type 2 diabetes mellitus without complication, with long-term current use of insulin ([place])   5. Coronary artery disease of native artery of native heart with stable angina pectoris ([place])   6. Mitral valve insufficiency, unspecified etiology   7. Chest pain in adult   8. Dizziness   9. Bradycardia    PLAN:    In order of problems listed above:  Dizziness/lightheadedness This has been going on the last 3 days, worse when she stands up. Also had some chest discomfort described as a pressure, says it's similar to acid-reflux although history was very confusing. Orthostatics negative. EKG shows sinus bradycardia with heart rate upper 40s. She is on coreg 3.125mg BID. BP mildly high but has not had medications today. She was started on a new medication last week, methocarbamol, which can cause lightheadedness and dizziness. She is staying hydrated. I will stop coreg for bradycardia. Also will get labs: BMET, CBC, TSH. We will get a heart monitor to check for arrhythmia. Last echo was 2018 which showed normal LV function, G1DD, mild MR, moderate TR.  Also will get an echo.  Chronic diasotlic CHF Mild MR LV functio normal by echo in 2018. Also had mild  MR. Patient is euvolemic on exam. Continue current torsemide dose. Repeat Echo as above.  DM2 Reports good sugar control. Cannot find recent A1C. Followed by PCP.   CAD s/p CABG and remote stenting Patient was seen in the ER earlier this month for chest pain, work-up for ACS was negative, felt to be GERD and Nexium was restarted. Had recurrent chest pain 2 days ago, similar to prior  episode but felt some pressure as well. I will order a stress test to evaluate for ischemia. EKG today with no new changes. Continue Plavix, statin. BB held for bradycardia.   HTN BP mildly elevated but has not had medications today. She does not check BP at home.   Morbid obesity Lifestyle is mostly sedentary.   Disposition: Follow up in 4-6 weeks or after testing with APP/MD  Shared Decision Making/Informed Consent   Shared Decision Making/Informed Consent The risks [chest pain, shortness of breath, cardiac arrhythmias, dizziness, blood pressure fluctuations, myocardial infarction, stroke/transient ischemic attack, nausea, vomiting, allergic reaction, radiation exposure, metallic taste sensation and life-threatening complications (estimated to be 1 in 10,000)], benefits (risk stratification, diagnosing coronary artery disease, treatment guidance) and alternatives of a nuclear stress test were discussed in detail with Ms. Sparlin and she agrees to proceed.       Signed, Shirlyn Savin Ninfa Meeker, PA-C  07/13/2020 11:41 AM    [facility]

## 2020-07-13 NOTE — Patient Instructions (Addendum)
Medication Instructions:  Your physician has recommended you make the following change in your medication:   STOP Carvedilol and Metoprolol  *If you need a refill on your cardiac medications before your next appointment, please call your pharmacy*   Lab Work: Your provider recommends lab work today: BMP, CBC, TSH  If you have labs (blood work) drawn today and your tests are completely normal, you will receive your results only by: [name] MyChart Message (if you have MyChart) OR . A paper copy in the mail If you have any lab test that is abnormal or we need to change your treatment, we will call you to review the results.   Testing/Procedures: Your physician has requested that you have an echocardiogram. Echocardiography is a painless test that uses sound waves to create images of your heart. It provides your doctor with information about the size and shape of your heart and how well your heart's chambers and valves are working. This procedure takes approximately one hour. There are no restrictions for this procedure.  Your physician has requested that you have a lexiscan myoview.  Please follow instruction sheet, as given.  Your physician has recommended that you wear a Zio monitor. (To be worn for 14 days) This monitor is a medical device that records the heart's electrical activity. Doctors most often use these monitors to diagnose arrhythmias. Arrhythmias are problems with the speed or rhythm of the heartbeat. The monitor is a small device applied to your chest. You can wear one while you do your normal daily activities. While wearing this monitor if you have any symptoms to push the button and record what you felt. Once you have worn this monitor for the period of time provider prescribed (Usually 14 days), you will return the monitor device in the postage paid box. Once it is returned they will download the data collected and provide [place] with a report which the provider will then review and we  will call you with those results. Important tips:  1. Avoid showering during the first 24 hours of wearing the monitor. 2. Avoid excessive sweating to help maximize wear time. 3. Do not submerge the device, no hot tubs, and no swimming pools. 4. Keep any lotions or oils away from the patch. 5. After 24 hours you may shower with the patch on. Take brief showers with your back facing the shower head.  6. Do not remove patch once it has been placed because that will interrupt data and decrease adhesive wear time. 7. Push the button when you have any symptoms and write down what you were feeling. 8. Once you have completed wearing your monitor, remove and place into box which has postage paid and place in your outgoing mailbox.  9. If for some reason you have misplaced your box then call our office and we can provide another box and/or mail it off for you.        Follow-Up: At [facility], you and your health needs are our priority.  As part of our continuing mission to provide you with exceptional heart care, we have created designated Provider Care Teams.  These Care Teams include your primary Cardiologist (physician) and Advanced Practice Providers (APPs -  Physician Assistants and Nurse Practitioners) who all work together to provide you with the care you need, when you need it.  We recommend signing up for the patient portal called "MyChart".  Sign up information is provided on this After Visit Summary.  MyChart is used to  connect with patients for Virtual Visits (Telemedicine).  Patients are able to view lab/test results, encounter notes, upcoming appointments, etc.  Non-urgent messages can be sent to your provider as well.   To learn more about what you can do with MyChart, go to [email or web address].    Your next appointment:   6 weeks  The format for your next appointment:   In Person  Provider:   You may see Ida Rogue, MD or one of the following Advanced Practice  Providers on your designated Care Team:    Murray Hodgkins, NP  Christell Faith, PA-C  Marrianne Mood, PA-C  Cadence [place], [place]  Laurann [place], NP  Other Instructions  [place]  Your caregiver has ordered a Stress Test with nuclear imaging. The purpose of this test is to evaluate the blood supply to your heart muscle. This procedure is referred to as a "Non-Invasive Stress Test." This is because other than having an IV started in your vein, nothing is inserted or "invades" your body. Cardiac stress tests are done to find areas of poor blood flow to the heart by determining the extent of coronary artery disease (CAD). Some patients exercise on a treadmill, which naturally increases the blood flow to your heart, while others who are  unable to walk on a treadmill due to physical limitations have a pharmacologic/chemical stress agent called Lexiscan . This medicine will mimic walking on a treadmill by temporarily increasing your coronary blood flow.   Please note: these test may take anywhere between 2-4 hours to complete  PLEASE REPORT TO [place] AT THE FIRST DESK WILL DIRECT YOU WHERE TO GO  Date of Procedure:_____________________________________  Arrival Time for Procedure:______________________________  Instructions regarding medication:   __X__ : Hold diabetes medication morning of procedure  __X__:  Hold other medications as follows: Torsemide and Metolazone the morning of the test. You can take it later in the day.  PLEASE NOTIFY THE OFFICE AT LEAST 45 HOURS IN ADVANCE IF YOU ARE UNABLE TO KEEP YOUR APPOINTMENT.  [phone] AND  PLEASE NOTIFY NUCLEAR MEDICINE AT [facility] AT LEAST 24 HOURS IN ADVANCE IF YOU ARE UNABLE TO KEEP YOUR APPOINTMENT. [phone]  How to prepare for your Myoview test:  1. Do not eat or drink after midnight 2. No caffeine for 24 hours prior to test 3. No smoking 24 hours prior to test. 4. Your medication may  be taken with water.  If your doctor stopped a medication because of this test, do not take that medication. 5. Ladies, please do not wear dresses.  Skirts or pants are appropriate. Please wear a short sleeve shirt. 6. No perfume, cologne or lotion. 7. Wear comfortable walking shoes. No heels!

## 2020-07-14 LAB — CBC
Hematocrit: 30.9 % — ABNORMAL LOW (ref 34.0–46.6)
Hemoglobin: 9.5 g/dL — ABNORMAL LOW (ref 11.1–15.9)
MCH: 25.5 pg — ABNORMAL LOW (ref 26.6–33.0)
MCHC: 30.7 g/dL — ABNORMAL LOW (ref 31.5–35.7)
MCV: 83 fL (ref 79–97)
Platelets: 293 10*3/uL (ref 150–450)
RBC: 3.73 x10E6/uL — ABNORMAL LOW (ref 3.77–5.28)
RDW: 14.9 % (ref 11.7–15.4)
WBC: 4.9 10*3/uL (ref 3.4–10.8)

## 2020-07-14 LAB — TSH: TSH: 2.64 u[IU]/mL (ref 0.450–4.500)

## 2020-07-14 LAB — BASIC METABOLIC PANEL
BUN/Creatinine Ratio: 19 (ref 12–28)
BUN: 22 mg/dL (ref 8–27)
CO2: 22 mmol/L (ref 20–29)
Calcium: 9.2 mg/dL (ref 8.7–10.3)
Chloride: 103 mmol/L (ref 96–106)
Creatinine, Ser: 1.17 mg/dL — ABNORMAL HIGH (ref 0.57–1.00)
Glucose: 113 mg/dL — ABNORMAL HIGH (ref 65–99)
Potassium: 4.1 mmol/L (ref 3.5–5.2)
Sodium: 144 mmol/L (ref 134–144)
eGFR: 50 mL/min/{1.73_m2} — ABNORMAL LOW (ref >59)

## 2020-07-19 ENCOUNTER — Telehealth: Payer: Self-pay

## 2020-07-19 NOTE — Telephone Encounter (Signed)
Attempted to call pt, LDM on VM (DPR approved), advised of recent lab work as reviewed by Lorenso Quarry, PA-C. Advised to call back for further details of results.

## 2020-07-19 NOTE — Telephone Encounter (Signed)
-----   Message from [place], PA-C sent at 07/16/2020  4:20 PM EDT ----- Please call patient and let her know labs show she is a little dehydrated. Please confirm how she is taking torsemide and if she is taking metolazone.

## 2020-07-24 ENCOUNTER — Encounter
Admission: RE | Admit: 2020-07-24 | Discharge: 2020-07-24 | Disposition: A | Discharge status: Home / Self Care | Payer: Medicare HMO | Source: Ambulatory Visit | Attending: Medical | Admitting: Medical

## 2020-07-24 ENCOUNTER — Other Ambulatory Visit: Payer: Self-pay

## 2020-07-24 DIAGNOSIS — R079 Chest pain, unspecified: Secondary | ICD-10-CM | POA: Diagnosis present

## 2020-07-24 LAB — NM MYOCAR MULTI W/SPECT W/WALL MOTION / EF
Estimated workload: 1 METS
Exercise duration (min): 0 min
Exercise duration (sec): 0 s
LV dias vol: 111 mL (ref 46–106)
LV sys vol: 52 mL
MPHR: 148 {beats}/min
Peak HR: 62 {beats}/min
Percent HR: 42 %
Rest HR: 48 {beats}/min
SDS: 5
SRS: 0
SSS: 5
TID: 1.14

## 2020-07-24 MED ORDER — REGADENOSON 0.4 MG/5ML IV SOLN
0.4000 mg | Freq: Once | INTRAVENOUS | Status: AC
  Administered 2020-07-24: 0.4 mg via INTRAVENOUS

## 2020-07-24 MED ORDER — TECHNETIUM TC 99M TETROFOSMIN IV KIT
10.0000 | PACK | Freq: Once | INTRAVENOUS | Status: AC | PRN
  Administered 2020-07-24: 10.42 via INTRAVENOUS

## 2020-07-24 MED ORDER — TECHNETIUM TC 99M TETROFOSMIN IV KIT
30.0000 | PACK | Freq: Once | INTRAVENOUS | Status: AC | PRN
  Administered 2020-07-24: 30.45 via INTRAVENOUS

## 2020-08-01 ENCOUNTER — Telehealth: Payer: Self-pay

## 2020-08-01 NOTE — Progress Notes (Signed)
Cardiology Office Note:    Date:  08/03/2020   ID:  Tina Hayes, DOB 1945-07-01, MRN [id number]  PCP:  Herminio Commons, MD  [facility] Cardiologist:  Ida Rogue, MD  [facility] Electrophysiologist:  None   Referring MD: Herminio Commons, MD   Chief Complaint: Result follow-up  History of Present Illness:    Tina Hayes is a 75 y.o. female with a hx of CAD status post remote MI PCI x5, three-vessel CABG in 2006 Tina Hayes, Tina Hayes, COPD secondary to tobacco abuse, hypertension, seizure disorder, diabetes type 2, PAD, chronic back pain who presents for follow-up.  The patient was seen 2018 for intermittent chest and shoulder pain and tenderness for many months, which turned out to be GI issues.  Since then, she has been followed by the office.  Echo 02/15/2019 showed EF 60%, right heart pressures mildly dilated, moderate MR.  Last seen 01/03/2020 and stable from a cardiac standpoint.  No changes were made  The patient was seen in the ER on 3/7 for chest pain, low suspicion for ACS.  She had run out of her PPI.  Cardiac work-up was negative, suspected acid reflux and Nexium was refilled.  Patient was last seen 07/13/2020, history difficult to obtain.  Reported lightheadedness and dizziness also had some chest pressure.  EKG showed bradycardia and Coreg was stopped.  Labs were obtained.  Heart monitor and echo were ordered.  The statics negative.  Labs showed she was dry and we tried to call the patient, but unable to get a hold of her.   Today, Echo, labs, and heart monitor were reviewed. The patient reports she has been doing poorly. She reports headches from tight neck muscles. It has been going on for about a week. Hard to sleep.She has been taking tramadol, plans to see PCP. She still has some dizziness and lightheadedness, but seems to be a little better. Still intermittent. Patient weight has gone down but she is trying to lose weights. She  is eating healthier. Says she is drinking a lot of water and Gatorade. Weights today down 6lbs. She confirmed she is taking metolzaone 2.5mg  BID and torsemide 60mg  BID. Echo showed normal EF and she is euvolemic on exam. Reports BP at home is labile. No chest pain or sob. No lower leg edema, orthopnea, pnd. No palpitations.   Past Medical History:  Diagnosis Date  . (HFpEF) heart failure with preserved ejection fraction (Tina Hayes)    a. 05/2016 Echo: EF 60-65%, mild to mod LVH, Gr1 DD, mild MR, mildly dil LA, mod TR, mildly to mod increased PASP.  [name] Acute diastolic heart failure (Tina Hayes) 01/27/2017  . Anxiety   . Arthritis   . Chest pain 06/16/2016  . CHF (congestive heart failure) (Tina Hayes)   . Chronic back pain   . Chronic diastolic congestive heart failure (Tina Hayes) 02/13/2017  . COPD (chronic obstructive pulmonary disease) (Tina Hayes)   . Coronary artery disease    a. s/p remote PCI x 5;  b. 2006 s/p CABG x 3 ([place], [place]); b. 05/2016 MV: attenuation corrected images w/o ischemia or wma-->Med rx.  . Coronary artery disease of native artery of native heart with stable angina pectoris (Tina Hayes) 06/17/2016  . Depression   . Diabetes mellitus without complication ([place])   . Essential hypertension 06/30/2016  . GERD (gastroesophageal reflux disease)   . Heart attack ([place])    Total of 3 per pt.  . Hypertension   .  Hypertensive urgency 06/03/2015  . Seizure (Tina Hayes)   . Seizures ([place])     Past Surgical History:  Procedure Laterality Date  . ABDOMINAL HYSTERECTOMY    . ANKLE SURGERY Right   . CATARACT EXTRACTION W/ INTRAOCULAR LENS  IMPLANT, BILATERAL    . COLONOSCOPY WITH PROPOFOL N/A 05/04/2017   Procedure: COLONOSCOPY WITH PROPOFOL;  Surgeon: [name], MD;  Location: [facility];  Service: Endoscopy;  Laterality: N/A;  . CORONARY ANGIOPLASTY    . CORONARY ARTERY BYPASS GRAFT     TRIPLE BYPASS  . ESOPHAGOGASTRODUODENOSCOPY (EGD) WITH PROPOFOL N/A 05/04/2017   Procedure:  ESOPHAGOGASTRODUODENOSCOPY (EGD) WITH PROPOFOL;  Surgeon: [name], MD;  Location: [facility] ENDOSCOPY;  Service: Endoscopy;  Laterality: N/A;  . EXCISION MASS LOWER EXTREMETIES Right 03/02/2018   Procedure: EXCISION SOFT TISSUE MASS FROM MEDIAL ASPECT OF RIGHT ANKLE;  Surgeon: [name], MD;  Location: [facility] ORS;  Service: Orthopedics;  Laterality: Right;  . FRACTURE SURGERY    . JOINT REPLACEMENT     right  . KNEE SURGERY Right   . LOWER EXTREMITY ANGIOGRAPHY Right 07/14/2019   Procedure: LOWER EXTREMITY ANGIOGRAPHY;  Surgeon: Algernon Huxley, MD;  Location: [place] CV LAB;  Service: Cardiovascular;  Laterality: Right;  . MOUTH SURGERY Left 07/14/2017  . TOTAL KNEE ARTHROPLASTY Left 09/27/2019   Procedure: TOTAL KNEE ARTHROPLASTY;  Surgeon: [name], MD;  Location: [facility] ORS;  Service: Orthopedics;  Laterality: Left;    Current Medications: Current Meds  Medication Sig  . albuterol (PROVENTIL HFA;VENTOLIN HFA) 108 (90 Base) MCG/ACT inhaler Inhale 2 puffs into the lungs every 6 (six) hours as needed for wheezing or shortness of breath.  [name] albuterol (PROVENTIL) (2.5 MG/3ML) 0.083% nebulizer solution Take 2.5 mg every 4 (four) hours as needed by nebulization for wheezing or shortness of breath.  [name] amLODipine (NORVASC) 5 MG tablet Take 1 tablet (5 mg total) by mouth daily.  [name] atorvastatin (LIPITOR) 40 MG tablet Take 1 tablet (40 mg total) by mouth daily.  . B-D UF III MINI PEN NEEDLES 31G X 5 MM MISC USE WITH INSULIN PEN INJECTIONS TWICE DAILY  . clopidogrel (PLAVIX) 75 MG tablet Take 1 tablet (75 mg total) by mouth daily.  . Dupilumab (DUPIXENT) 300 MG/2ML SOPN Inject 300 mg into the skin every 14 (fourteen) days.   [name] esomeprazole (NEXIUM) 40 MG capsule Take 1 capsule (40 mg total) by mouth 2 (two) times daily.  . ferrous sulfate 325 (65 FE) MG tablet Take 325 mg by mouth daily.  [name] HYDROcodone-acetaminophen (NORCO) 7.5-325 MG tablet Take 1 tablet by mouth every 6 (six) hours as  needed for moderate pain or severe pain.  . isosorbide mononitrate (IMDUR) 30 MG 24 hr tablet Take 1 tablet by mouth twice daily  . LANTUS SOLOSTAR 100 UNIT/ML Solostar Pen Inject 30-40 Units into the skin See admin instructions. Inject 30 units subcutaneously in the morning at 40 units subcutaneously at bedtime.  . levETIRAcetam (KEPPRA) 750 MG tablet Take 750 mg by mouth 2 (two) times daily.  [name] losartan (COZAAR) 100 MG tablet Take 1 tablet (100 mg total) by mouth daily.  . meloxicam (MOBIC) 7.5 MG tablet Take 7.5 mg by mouth daily as needed for pain.   . montelukast (SINGULAIR) 10 MG tablet Take 10 mg by mouth daily.   . nitroGLYCERIN (NITROSTAT) 0.4 MG SL tablet DISSOLVE ONE TABLET UNDER THE TONGUE EVERY 5 MINUTES AS NEEDED FOR CHEST PAIN.  DO NOT EXCEED A TOTAL OF  3 DOSES IN 15 MINUTES  . NOVOLOG FLEXPEN 100 UNIT/ML FlexPen Inject 10 Units into the skin 3 (three) times daily with meals.  . ondansetron (ZOFRAN) 4 MG tablet Take 4 mg by mouth as needed.  . potassium chloride SA (KLOR-CON) 20 MEQ tablet Take 2 tablets (40 mEq total) by mouth 2 (two) times daily. Take extra 2 tablets (40 meq) if you take metolazone  . sertraline (ZOLOFT) 100 MG tablet Take 100 mg by mouth at bedtime.  [name] tiotropium (SPIRIVA) 18 MCG inhalation capsule Place 18 mcg into inhaler and inhale daily.  . traZODone (DESYREL) 50 MG tablet Take 50 mg by mouth at bedtime.  . [DISCONTINUED] metolazone (ZAROXOLYN) 2.5 MG tablet Take 1 tablet (2.5 mg total) by mouth daily. 1/2 hour prior to torsemide  . [DISCONTINUED] torsemide (DEMADEX) 20 MG tablet Take 3 tablets (60 mg total) by mouth 2 (two) times daily. 60mg  (take 3 tabs) by mouth two times a day     Allergies:   Aspirin   Social History   Socioeconomic History  . Marital status: Widowed    Spouse name: Not on file  . Number of children: Not on file  . Years of education: Not on file  . Highest education level: Not on file  Occupational History  . Not on file   Tobacco Use  . Smoking status: Former Smoker    Packs/day: 0.25    Years: 20.00    Pack years: 5.00    Types: Cigarettes    Quit date: 07/01/2006    Years since quitting: 14.1  . Smokeless tobacco: Never Used  Vaping Use  . Vaping Use: Never used  Substance and Sexual Activity  . Alcohol use: No    Alcohol/week: 0.0 standard drinks  . Drug use: No  . Sexual activity: Yes  Other Topics Concern  . Not on file  Social History Narrative   Lives locally.  Does not routinely exercise.   Social Determinants of Health   Financial Resource Strain: Not on file  Food Insecurity: Not on file  Transportation Needs: Not on file  Physical Activity: Not on file  Stress: Not on file  Social Connections: Not on file     Family History: The patient's family history includes Cancer in her sister; Depression in her mother; Diabetes in her brother, mother, and another family member; Heart attack in her mother and sister; Heart disease in her mother; Heart failure in her mother and sister; Hypertension in her brother, mother, sister, and another family member; SIDS in her sister; Stroke in her mother.  ROS:   Please see the history of present illness.     All other systems reviewed and are negative.  EKGs/Labs/Other Studies Reviewed:    The following studies were reviewed today:  Echo 08/02/20 1. Left ventricular ejection fraction, by estimation, is 55 to 60%. Left  ventricular ejection fraction by 3D volume is 58 %. The left ventricle has  normal function. The left ventricle has no regional wall motion  abnormalities. There is mild left  ventricular hypertrophy. Left ventricular diastolic parameters are  consistent with Grade I diastolic dysfunction (impaired relaxation).  2. Right ventricular systolic function is normal. The right ventricular  size is normal. There is normal pulmonary artery systolic pressure.  3. Left atrial size was mildly dilated.  4. The mitral valve is grossly  normal. Mild mitral valve regurgitation.  5. Tricuspid valve regurgitation is mild to moderate.  6. The aortic valve is tricuspid. Aortic  valve regurgitation is not  visualized. Mild to moderate aortic valve sclerosis/calcification is  present, without any evidence of aortic stenosis.  7. The inferior vena cava is normal in size with greater than 50%  respiratory variability, suggesting right atrial pressure of 3 mmHg.   Heart monitor  Patch Wear Time:  11 days and 3 hours (2022-03-18T11:58:31-0400 to 2022-03-29T15:13:51-0400)  Patient had a min HR of 40 bpm, max HR of 143 bpm, and avg HR of 54 bpm. Predominant underlying rhythm was Sinus Rhythm. 1 run of Ventricular Tachycardia occurred lasting 4 beats with a max rate of 141 bpm (avg 127 bpm). 3 Supraventricular Tachycardia  runs occurred, the run with the fastest interval lasting 4 beats with a max rate of 143 bpm, the longest lasting 4 beats with an avg rate of 103 bpm. Isolated SVEs were rare (<1.0%), SVE Couplets were rare (<1.0%), and SVE Triplets were rare (<1.0%).  Isolated VEs were rare (<1.0%, 1969), VE Couplets were rare (<1.0%, 331), and VE Triplets were rare (<1.0%, 7). Ventricular Bigeminy and Trigeminy were present.           Echo 2020  1. Left ventricular ejection fraction, by visual estimation, is 60 to  65%. The left ventricle has normal function. Normal left ventricular size.  There is mildly increased left ventricular hypertrophy.  2. Left ventricular diastolic Doppler parameters are consistent with  impaired relaxation pattern of LV diastolic filling.  3. Global right ventricle has normal systolic function.The right  ventricular size is mildly enlarged. No increase in right ventricular wall  thickness.  4. Left atrial size was mildly dilated.  5. Moderate mitral valve regurgitation.  6. The tricuspid valve is normal in structure. Tricuspid valve  regurgitation moderate.  7. Moderately elevated  pulmonary artery systolic pressure.  8. The tricuspid regurgitant velocity is 2.83 m/s, and with an assumed  right atrial pressure of 15 mmHg, the estimated right ventricular systolic  pressure is moderately elevated at 47.0   EKG:  EKG is not ordered today.  Recent Labs: 07/02/2020: ALT 14 07/13/2020: BUN 22; Creatinine, Ser 1.17; Hemoglobin 9.5; Platelets 293; Potassium 4.1; Sodium 144; TSH 2.640  Recent Lipid Panel    Component Value Date/Time   CHOL 120 01/30/2017 0404   TRIG 73 01/30/2017 0404   HDL 47 01/30/2017 0404   CHOLHDL 2.6 01/30/2017 0404   VLDL 15 01/30/2017 0404   LDLCALC 58 01/30/2017 0404      Physical Exam:    VS:  BP 110/72 (BP Location: Right Arm, Patient Position: Sitting, Cuff Size: Large)   Pulse (!) 47   Ht 5\' 11"  (1.803 m)   Wt 260 lb (117.9 kg)   SpO2 98%   BMI 36.26 kg/m     Wt Readings from Last 3 Encounters:  08/03/20 260 lb (117.9 kg)  07/13/20 266 lb (120.7 kg)  07/02/20 260 lb (117.9 kg)     GEN:  Well nourished, well developed in no acute distress HEENT: Normal NECK: No JVD; No carotid bruits LYMPHATICS: No lymphadenopathy CARDIAC: RR, bradycardia, no murmurs, rubs, gallops RESPIRATORY:  Clear to auscultation without rales, wheezing or rhonchi  ABDOMEN: Soft, non-tender, non-distended MUSCULOSKELETAL:  No edema; No deformity  SKIN: Warm and dry NEUROLOGIC:  Alert and oriented x 3 PSYCHIATRIC:  Normal affect   ASSESSMENT:    1. Chronic diastolic congestive heart failure ([place])   2. Coronary artery disease of native artery of native heart with stable angina pectoris ([place])   3. Mitral valve insufficiency,  unspecified etiology   4. Essential hypertension   5. Dizziness    PLAN:    In order of problems listed above:  Dizziness/lightheadedness Symptoms improved after stopping coreg but still intermittent. BP today is good, 110/72, pulse still low at 47bpm. Heart monitor showed mainly SB with average heart rate 54bpm, with rare  brief SVT and NSVT. Echo showed normal LV function, mild LVH, G1DD, and some valvular abnormalities. Labs at the last visit showed possible dehydration, but could not get a hold of patient. She says she has been taking metolazone/torsemide as prescribed, but also does reports she is drinking a lot of water. Dehydration could be contributing to symptoms. I will stop metolazone and decrease Torsemide, esp with normal EF. Labs in 1 week with follow-up in a months.   Chronic diastolic CHF Valvular disease Echo showed LVEF 55-60%, no WMA, mild LVH, G1DD, mildly dilated LA, mild MR, mild to mod TR, mild to mod AS. Patient is on metolazone 2.5mg  BID and Torsemide 60mg  BID. Last labs showed she was dehydrated but we were unable to get a hold of the patient as above. She is euvolemic on exam. Weight trending down, although she is trying to lose weights. I will stop the metolazone as above and decrease Torsemide to 20mg  BID. Labs in 1 week and we will see her back in 1 month to evaluate volume status. Continue with daily weights.  CAD s/p CABG and remote stenting Patient denies anginal symptoms. Continue Plavix, Imdur. BB stopped for bradycardia, heart rate 47 today.  Hypertension BP good today. Continue current medications.   Morbid obesity Patient has been trying to lose weight through diet changes. She is down 6 lbs since the last visit.    Disposition: Follow up in 1 month(s) with APP    Signed, Kaymon Denomme Ninfa Meeker, PA-C  08/03/2020 10:18 AM    [facility]

## 2020-08-01 NOTE — Telephone Encounter (Signed)
Attempted to reach pt regarding her recent Lexiscan, LDM on VM (DPR approved). Advised can also review further at her f/u appt after her echo on Friday.   "Low risk stress test, normal pump function, low risk study, did not CAD and aortic calcifications. Can be further discussed at follow-up." Kathlen Mody, PA-C

## 2020-08-02 ENCOUNTER — Ambulatory Visit (INDEPENDENT_AMBULATORY_CARE_PROVIDER_SITE_OTHER): Payer: Medicare HMO

## 2020-08-02 ENCOUNTER — Other Ambulatory Visit: Payer: Self-pay

## 2020-08-02 DIAGNOSIS — I5032 Chronic diastolic (congestive) heart failure: Secondary | ICD-10-CM | POA: Diagnosis not present

## 2020-08-02 DIAGNOSIS — R42 Dizziness and giddiness: Secondary | ICD-10-CM

## 2020-08-02 DIAGNOSIS — R079 Chest pain, unspecified: Secondary | ICD-10-CM

## 2020-08-02 LAB — ECHOCARDIOGRAM COMPLETE
Area-P 1/2: 2.31 cm2
S' Lateral: 2.3 cm

## 2020-08-03 ENCOUNTER — Encounter: Payer: Self-pay | Admitting: Medical

## 2020-08-03 ENCOUNTER — Ambulatory Visit (INDEPENDENT_AMBULATORY_CARE_PROVIDER_SITE_OTHER): Payer: Medicare HMO | Admitting: Medical

## 2020-08-03 VITALS — BP 110/72 | HR 47 | Ht 71.0 in | Wt 260.0 lb

## 2020-08-03 DIAGNOSIS — I5032 Chronic diastolic (congestive) heart failure: Secondary | ICD-10-CM | POA: Diagnosis not present

## 2020-08-03 DIAGNOSIS — I340 Nonrheumatic mitral (valve) insufficiency: Secondary | ICD-10-CM

## 2020-08-03 DIAGNOSIS — I10 Essential (primary) hypertension: Secondary | ICD-10-CM | POA: Diagnosis not present

## 2020-08-03 DIAGNOSIS — I25118 Atherosclerotic heart disease of native coronary artery with other forms of angina pectoris: Secondary | ICD-10-CM

## 2020-08-03 DIAGNOSIS — R42 Dizziness and giddiness: Secondary | ICD-10-CM

## 2020-08-03 MED ORDER — TORSEMIDE 20 MG PO TABS: ORAL_TABLET | ORAL | Status: DC

## 2020-08-03 NOTE — Patient Instructions (Signed)
Medication Instructions:  - Your physician has recommended you make the following change in your medication:   1) STOP metolazone  2) DECREASE torsemide 20 mg- take 1 tablet (20 mg) by mouth TWICE daily  *If you need a refill on your cardiac medications before your next appointment, please call your pharmacy*   Lab Work: - Your physician recommends that you return for lab work in: 1 week (around 08/10/20)- [place] Entrance at [name] 1st desk on the right to check in, past the screening table Lab hours: Monday- Friday (7:30 am- 5:30 pm)   If you have labs (blood work) drawn today and your tests are completely normal, you will receive your results only by: [name] MyChart Message (if you have MyChart) OR . A paper copy in the mail If you have any lab test that is abnormal or we need to change your treatment, we will call you to review the results.   Testing/Procedures: - none ordered   Follow-Up: At [facility], you and your health needs are our priority.  As part of our continuing mission to provide you with exceptional heart care, we have created designated Provider Care Teams.  These Care Teams include your primary Cardiologist (physician) and Advanced Practice Providers (APPs -  Physician Assistants and Nurse Practitioners) who all work together to provide you with the care you need, when you need it.  We recommend signing up for the patient portal called "MyChart".  Sign up information is provided on this After Visit Summary.  MyChart is used to connect with patients for Virtual Visits (Telemedicine).  Patients are able to view lab/test results, encounter notes, upcoming appointments, etc.  Non-urgent messages can be sent to your provider as well.   To learn more about what you can do with MyChart, go to [email or web address].    Your next appointment:   1 month(s)  The format for your next appointment:   In Person  Provider:   You may see Ida Rogue, MD or one  of the following Advanced Practice Providers on your designated Care Team:    Murray Hodgkins, NP  Christell Faith, PA-C  Marrianne Mood, PA-C  Cadence Middleton, [place]  Laurann Montana, NP    Other Instructions

## 2020-08-10 ENCOUNTER — Telehealth: Payer: Self-pay | Admitting: Cardiovascular Disease

## 2020-08-10 MED ORDER — AMLODIPINE BESYLATE 5 MG PO TABS
5.0000 mg | ORAL_TABLET | Freq: Every day | ORAL | 6 refills | Status: DC
Start: 2020-08-10 — End: 2021-10-24

## 2020-08-10 NOTE — Telephone Encounter (Signed)
amLODipine (NORVASC) 5 MG tablet [[id number]]   Order Details Dose: 5 mg Route: Oral Frequency: Daily  Dispense Quantity: 30 tablet Refills: 6       Sig: Take 1 tablet (5 mg total) by mouth daily.      Start Date: 08/10/20 End Date: 11/08/20 after 90 doses  Written Date: 08/10/20 Expiration Date: 08/10/21  Original Order:  amLODipine (NORVASC) 5 MG tablet [[id number]]   Providers  Ordering and Authorizing Provider:   Minna Merritts, MD  [place], [place] [address]  Phone:  [phone]  Fax:  [phone]  DEA #:  [id number]  NPI:  [id number]     Ordering User:  Mena Lienau, [place] [address] ([address]), [place] [address]  [address] [place])  [id number]  Phone:  [phone] Fax:  [phone]  DEA #:  --  DAW Reason: --

## 2020-08-22 ENCOUNTER — Telehealth: Payer: Self-pay | Admitting: *Deleted

## 2020-08-22 NOTE — Telephone Encounter (Signed)
-----   Message from Cadence H Furth, PA-C sent at 08/22/2020  7:30 AM EDT ----- Heart monitor showed mainly NSR with 1 run of NSVT, brief SVT and some PVCs/PACs. Triggered events were rare PACs. Overall reassuring. 

## 2020-08-22 NOTE — Telephone Encounter (Signed)
Left voicemail message to call back for review of results.  

## 2020-08-23 NOTE — Telephone Encounter (Signed)
No answer. No voicemail. 

## 2020-09-02 NOTE — Progress Notes (Deleted)
Cardiology Office Note:    Date:  09/03/2020   ID:  Tina Hayes, DOB 05/08/1945, MRN [id number]  PCP:  Herminio Commons, MD  [facility] Cardiologist:  Ida Rogue, MD  [facility] Electrophysiologist:  None   Referring MD: Herminio Commons, MD   Chief Complaint: 1 month follow-up  History of Present Illness:    Tina Hayes is a 75 y.o. 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   Echo 01/2019 showed EF 60%, RH pressures mildly elevated, mod MR.   Seen in the ER for chest pain, low suspicion for ACS. She had run out of PPI and this was refilled.   Coreg was stopped for bradycardia on 3/18. Heart monitor and eho ordered.  Echo showed LVEF 55-60%, no WMA, mild LVH, G1DD, mildly dilated LA, mild to mod TR, mild to mod AI Heart monitor showed NSR with 1 run of NSVT, brief SVT and some PVCs/PACs. Triggered events were rare PACs  Seen 08/03/20 and reported improved lightheadedness. Metolazone was stopped, torsemide decreased.   Today,     Past Medical History:  Diagnosis Date  . (HFpEF) heart failure with preserved ejection fraction ([place])    a. 05/2016 Echo: EF 60-65%, mild to mod LVH, Gr1 DD, mild MR, mildly dil LA, mod TR, mildly to mod increased PASP.  [name] Acute diastolic heart failure ([place]) 01/27/2017  . Anxiety   . Arthritis   . Chest pain 06/16/2016  . CHF (congestive heart failure) ([place])   . Chronic back pain   . Chronic diastolic congestive heart failure ([place]) 02/13/2017  . COPD (chronic obstructive pulmonary disease) ([place])   . Coronary artery disease    a. s/p remote PCI x 5;  b. 2006 s/p CABG x 3 ([place], [place]); b. 05/2016 MV: attenuation corrected images  w/o ischemia or wma-->Med rx.  . Coronary artery disease of native artery of native heart with stable angina pectoris ([place]) 06/17/2016  . Depression   . Diabetes mellitus without complication ([place])   . Essential hypertension 06/30/2016  . GERD (gastroesophageal reflux disease)   . Heart attack ([place])    Total of 3 per pt.  . Hypertension   . Hypertensive urgency 06/03/2015  . Seizure ([place])   . Seizures ([place])     Past Surgical History:  Procedure Laterality Date  . ABDOMINAL HYSTERECTOMY    . ANKLE SURGERY Right   . CATARACT EXTRACTION W/ INTRAOCULAR LENS  IMPLANT, BILATERAL    . COLONOSCOPY WITH PROPOFOL N/A 05/04/2017   Procedure: COLONOSCOPY WITH PROPOFOL;  Surgeon: [name], MD;  Location: [facility] ENDOSCOPY;  Service: Endoscopy;  Laterality: N/A;  . CORONARY ANGIOPLASTY    . CORONARY ARTERY BYPASS GRAFT     TRIPLE BYPASS  . ESOPHAGOGASTRODUODENOSCOPY (EGD) WITH PROPOFOL N/A 05/04/2017   Procedure: ESOPHAGOGASTRODUODENOSCOPY (EGD) WITH PROPOFOL;  Surgeon: [name], MD;  Location: [facility] ENDOSCOPY;  Service: Endoscopy;  Laterality: N/A;  . EXCISION MASS LOWER EXTREMETIES Right 03/02/2018   Procedure: EXCISION SOFT TISSUE MASS FROM MEDIAL ASPECT OF RIGHT ANKLE;  Surgeon: [name], MD;  Location: [facility] ORS;  Service: Orthopedics;  Laterality: Right;  . FRACTURE SURGERY    .  JOINT REPLACEMENT     right  . KNEE SURGERY Right   . LOWER EXTREMITY ANGIOGRAPHY Right 07/14/2019   Procedure: LOWER EXTREMITY ANGIOGRAPHY;  Surgeon: Algernon Huxley, MD;  Location: [place] CV LAB;  Service: Cardiovascular;  Laterality: Right;  . MOUTH SURGERY Left 07/14/2017  . TOTAL KNEE ARTHROPLASTY Left 09/27/2019   Procedure: TOTAL KNEE ARTHROPLASTY;  Surgeon: [name], MD;  Location: [facility] ORS;  Service: Orthopedics;  Laterality: Left;    Current Medications: No outpatient medications have been marked as taking for the 09/03/20 encounter (Appointment) with Kathlen Mody, [place] Sloop H, PA-C.      Allergies:   Aspirin   Social History   Socioeconomic History  . Marital status: Widowed    Spouse name: Not on file  . Number of children: Not on file  . Years of education: Not on file  . Highest education level: Not on file  Occupational History  . Not on file  Tobacco Use  . Smoking status: Former Smoker    Packs/day: 0.25    Years: 20.00    Pack years: 5.00    Types: Cigarettes    Quit date: 07/01/2006    Years since quitting: 14.1  . Smokeless tobacco: Never Used  Vaping Use  . Vaping Use: Never used  Substance and Sexual Activity  . Alcohol use: No    Alcohol/week: 0.0 standard drinks  . Drug use: No  . Sexual activity: Yes  Other Topics Concern  . Not on file  Social History Narrative   Lives locally.  Does not routinely exercise.   Social Determinants of Health   Financial Resource Strain: Not on file  Food Insecurity: Not on file  Transportation Needs: Not on file  Physical Activity: Not on file  Stress: Not on file  Social Connections: Not on file     Family History: The patient's ***family history includes Cancer in her sister; Depression in her mother; Diabetes in her brother, mother, and another family member; Heart attack in her mother and sister; Heart disease in her mother; Heart failure in her mother and sister; Hypertension in her brother, mother, sister, and another family member; SIDS in her sister; Stroke in her mother.  ROS:   Please see the history of present illness.    *** All other systems reviewed and are negative.  EKGs/Labs/Other Studies Reviewed:    The following studies were reviewed today:  Echo 08/02/20 1. Left ventricular ejection fraction, by estimation, is 55 to 60%. Left  ventricular ejection fraction by 3D volume is 58 %. The left ventricle has  normal function. The left ventricle has no regional wall motion  abnormalities. There is mild left  ventricular hypertrophy. Left ventricular diastolic parameters are   consistent with Grade I diastolic dysfunction (impaired relaxation).  2. Right ventricular systolic function is normal. The right ventricular  size is normal. There is normal pulmonary artery systolic pressure.  3. Left atrial size was mildly dilated.  4. The mitral valve is grossly normal. Mild mitral valve regurgitation.  5. Tricuspid valve regurgitation is mild to moderate.  6. The aortic valve is tricuspid. Aortic valve regurgitation is not  visualized. Mild to moderate aortic valve sclerosis/calcification is  present, without any evidence of aortic stenosis.  7. The inferior vena cava is normal in size with greater than 50%  respiratory variability, suggesting right atrial pressure of 3 mmHg.   Heart monitor 07/30/20 Study Highlights  Event Monitor Patch Wear Time:  11 days and 3  hours   Normal sinus rhythm Patient had a min HR of 40 bpm, max HR of 143 bpm, and avg HR of 54 bpm.    1 run of Ventricular Tachycardia occurred lasting 4 beats with a max rate of 141 bpm (avg 127 bpm).  3 Supraventricular Tachycardia  runs occurred, the run with the fastest interval lasting 4 beats with a max rate of 143 bpm, the longest lasting 4 beats with an avg rate of 103 bpm.  Isolated SVEs were rare (<1.0%), SVE Couplets were rare (<1.0%), and SVE Triplets were rare (<1.0%).  Isolated VEs were rare (<1.0%, 1969), VE Couplets were rare (<1.0%, 331), and VE Triplets were rare (<1.0%, 7). Ventricular Bigeminy and Trigeminy were present.   Patient triggered events associated with rare PAC  Signed, Esmond Plants, MD, Ph.D [facility]  Stress test 07/24/20 Narrative & Impression   T wave inversion was noted during stress.  The study is normal.  This is a low risk study.  The left ventricular ejection fraction is normal (55-65%).  CT attenuation images showed significant aortic and coronary calcifications.       EKG:  EKG is *** ordered today.  The ekg ordered today  demonstrates ***  Recent Labs: 07/02/2020: ALT 14 07/13/2020: BUN 22; Creatinine, Ser 1.17; Hemoglobin 9.5; Platelets 293; Potassium 4.1; Sodium 144; TSH 2.640  Recent Lipid Panel    Component Value Date/Time   CHOL 120 01/30/2017 0404   TRIG 73 01/30/2017 0404   HDL 47 01/30/2017 0404   CHOLHDL 2.6 01/30/2017 0404   VLDL 15 01/30/2017 0404   LDLCALC 58 01/30/2017 0404     Risk Assessment/Calculations:   {Does this patient have ATRIAL FIBRILLATION?:[phone]}   Physical Exam:    VS:  There were no vitals taken for this visit.    Wt Readings from Last 3 Encounters:  08/03/20 260 lb (117.9 kg)  07/13/20 266 lb (120.7 kg)  07/02/20 260 lb (117.9 kg)     GEN: *** Well nourished, well developed in no acute distress HEENT: Normal NECK: No JVD; No carotid bruits LYMPHATICS: No lymphadenopathy CARDIAC: ***RRR, no murmurs, rubs, gallops RESPIRATORY:  Clear to auscultation without rales, wheezing or rhonchi  ABDOMEN: Soft, non-tender, non-distended MUSCULOSKELETAL:  No edema; No deformity  SKIN: Warm and dry NEUROLOGIC:  Alert and oriented x 3 PSYCHIATRIC:  Normal affect   ASSESSMENT:    1. Chronic diastolic congestive heart failure ([place])   2. Dizziness   3. Hx of CABG   4. Coronary artery disease involving native coronary artery of native heart with angina pectoris ([place])   5. Essential hypertension    PLAN:    In order of problems listed above:  Dizziness, lightheadedness  Chronic diastolic CHF  Valvular disease  CAD s/p CABG and PCI  HTN  Obesity  Disposition: Follow up {follow up:15908} with ***   Shared Decision Making/Informed Consent   {Are you ordering a CV Procedure (e.g. stress test, cath, DCCV, TEE, etc)?   Press F2        :[id number]}    Signed, Shawntell Dixson Ninfa Meeker, PA-C  09/03/2020 7:42 AM    [facility]

## 2020-09-03 ENCOUNTER — Other Ambulatory Visit: Payer: Self-pay

## 2020-09-03 ENCOUNTER — Ambulatory Visit: Payer: Medicare HMO | Attending: Anesthesiology | Admitting: Anesthesiology

## 2020-09-03 ENCOUNTER — Ambulatory Visit: Payer: Medicare HMO | Admitting: Medical

## 2020-09-03 DIAGNOSIS — M4716 Other spondylosis with myelopathy, lumbar region: Secondary | ICD-10-CM

## 2020-09-03 DIAGNOSIS — M170 Bilateral primary osteoarthritis of knee: Secondary | ICD-10-CM

## 2020-09-03 DIAGNOSIS — G894 Chronic pain syndrome: Secondary | ICD-10-CM

## 2020-09-03 DIAGNOSIS — M47817 Spondylosis without myelopathy or radiculopathy, lumbosacral region: Secondary | ICD-10-CM

## 2020-09-03 DIAGNOSIS — M5432 Sciatica, left side: Secondary | ICD-10-CM

## 2020-09-03 DIAGNOSIS — F1190 Opioid use, unspecified, uncomplicated: Secondary | ICD-10-CM

## 2020-09-03 DIAGNOSIS — M48062 Spinal stenosis, lumbar region with neurogenic claudication: Secondary | ICD-10-CM

## 2020-09-03 DIAGNOSIS — M25512 Pain in left shoulder: Secondary | ICD-10-CM

## 2020-09-03 DIAGNOSIS — M5136 Other intervertebral disc degeneration, lumbar region: Secondary | ICD-10-CM

## 2020-09-03 MED ORDER — HYDROCODONE-ACETAMINOPHEN 7.5-325 MG PO TABS: 1.0000 | ORAL_TABLET | Freq: Four times a day (QID) | ORAL | 0 refills | Status: AC | PRN

## 2020-09-03 MED ORDER — HYDROCODONE-ACETAMINOPHEN 7.5-325 MG PO TABS: 1.0000 | ORAL_TABLET | Freq: Four times a day (QID) | ORAL | 0 refills | Status: DC | PRN

## 2020-09-04 ENCOUNTER — Telehealth: Payer: Self-pay | Admitting: *Deleted

## 2020-09-04 NOTE — Telephone Encounter (Signed)
-----   Message from [place], PA-C sent at 08/22/2020  7:30 AM EDT ----- Heart monitor showed mainly NSR with 1 run of NSVT, brief SVT and some PVCs/PACs. Triggered events were rare PACs. Overall reassuring.

## 2020-09-04 NOTE — Telephone Encounter (Signed)
See also telephone message from 08/22/20.  Attempted to call pt, this is third and final attempt, to review zio monitor results.  No answer. Lmtcb. Pt also was no show for last ov appt for 1 month follow up with [place].  Letter mailed asking pt contact office to review results and to scheduled follow up appt.

## 2020-09-09 ENCOUNTER — Other Ambulatory Visit: Payer: Self-pay | Admitting: Cardiovascular Disease

## 2020-09-14 ENCOUNTER — Encounter (INDEPENDENT_AMBULATORY_CARE_PROVIDER_SITE_OTHER): Payer: Self-pay | Admitting: Vascular Surgery

## 2020-09-14 ENCOUNTER — Ambulatory Visit (INDEPENDENT_AMBULATORY_CARE_PROVIDER_SITE_OTHER): Payer: Medicare HMO | Admitting: Vascular Surgery

## 2020-09-18 NOTE — Progress Notes (Addendum)
Cardiology Office Note:    Date:  09/19/2020   ID:  Tina Hayes, Tina Hayes [birth year], MRN [id number]  PCP:  Herminio Commons, MD  [facility] Cardiologist:  Ida Rogue, MD  [facility] Electrophysiologist:  None   Referring MD: Herminio Commons, MD   Chief Complaint: 1 month follow-up  History of Present Illness:    Tina Hayes is a 75 y.o. 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   The patient was seen in 2018 for intermittent chest and shoulder pain and tenderness for many months, which turned out to be GI issues. Since then she has been followed by the office. Echo 01/2019 showed EF 60%, mildly dilated right heart, moderate MR.   Seen in the ER 07/02/20 for chest pain, low suspicion for ACS. She had run out of her PPI. Cardiac work-up was negative, suspected acid-relfux and Nexium was refilled.   Patient seen 07/13/20 and reported lightheadedness, dizziness, and chest pressure. EKG showed bradycardia and coreg was discontinued. Echo and heart monitor ordered. Echo showed normal LV function, mild LVH, G1DD, mild to mod AS. Heart monitor showed SB, brief SVT and NSVT. Orthostatics negative. There was question of metolazone and torsemide dosing. Previous labs showed she was dry. Given normal EF and labs, metolazone was discontinued.   Today, the patient says she is not feeling well. Says she is tired and has no energy. She feels off balance. Feels memory is poor. She reports she hasn't been sleeping well. No lightheadedness or dizziness. She took Nitro last week. Unsure if she had chest pain, it was more like discomfort. She thinks it was her acid-reflux because she ran out of her  PPI again. Nitro minimally helped the pain. Denied palpitations and shortness of breath. She had an apt earlier this month with PCP for  diarrhea abd abdominal pain, weights was 271lbs. She says she has appointment with PCP in June. Needs refill of Nexium. She has been taking torsemide 20mg  twice daily for 2 weeks. She reports weights at home are stable, however weight is up here, 260>272lbs. Says she is eating more at home, feeling hungry a lot. BP is up, but she has not had her medications this AM. RedsVest today 34%. She has an appointment today with [facility] for follow-up for chronic venous insufficiency.  Labs at PCP 09/05/20 WBC 6.1, Hgb 10, Hct 32, A1C 6.7. Weight was 271lbs  Past Medical History:  Diagnosis Date  . (HFpEF) heart failure with preserved ejection fraction ([place])    a. 05/2016 Echo: EF 60-65%, mild to mod LVH, Gr1 DD, mild MR, mildly dil LA, mod TR, mildly to mod increased PASP.  [name] Acute diastolic heart failure ([place]) 01/27/2017  . Anxiety   . Arthritis   . Chest pain 06/16/2016  . CHF (congestive heart failure) ([place])   . Chronic back pain   . Chronic diastolic congestive heart failure ([place]) 02/13/2017  . COPD (chronic obstructive pulmonary disease) ([place])   . Coronary artery disease    a. s/p remote PCI x 5;  b. 2006 s/p CABG x 3 ([place], [place]); b. 05/2016 MV: attenuation corrected images w/o ischemia or wma-->Med rx.  . Coronary artery disease of native artery of native heart with stable angina pectoris ([place]) 06/17/2016  . Depression   .  Diabetes mellitus without complication ([place])   . Essential hypertension 06/30/2016  . GERD (gastroesophageal reflux disease)   . Heart attack ([place])    Total of 3 per pt.  . Hypertension   . Hypertensive urgency 06/03/2015  . Seizure ([place])   . Seizures ([place])     Past Surgical History:  Procedure Laterality Date  . ABDOMINAL HYSTERECTOMY    . ANKLE SURGERY Right   . CATARACT EXTRACTION W/ INTRAOCULAR LENS  IMPLANT, BILATERAL    . COLONOSCOPY WITH PROPOFOL N/A 05/04/2017   Procedure: COLONOSCOPY WITH PROPOFOL;  Surgeon: [name], MD;   Location: [facility] ENDOSCOPY;  Service: Endoscopy;  Laterality: N/A;  . CORONARY ANGIOPLASTY    . CORONARY ARTERY BYPASS GRAFT     TRIPLE BYPASS  . ESOPHAGOGASTRODUODENOSCOPY (EGD) WITH PROPOFOL N/A 05/04/2017   Procedure: ESOPHAGOGASTRODUODENOSCOPY (EGD) WITH PROPOFOL;  Surgeon: [name], MD;  Location: [facility] ENDOSCOPY;  Service: Endoscopy;  Laterality: N/A;  . EXCISION MASS LOWER EXTREMETIES Right 03/02/2018   Procedure: EXCISION SOFT TISSUE MASS FROM MEDIAL ASPECT OF RIGHT ANKLE;  Surgeon: [name], MD;  Location: [facility] ORS;  Service: Orthopedics;  Laterality: Right;  . FRACTURE SURGERY    . JOINT REPLACEMENT     right  . KNEE SURGERY Right   . LOWER EXTREMITY ANGIOGRAPHY Right 07/14/2019   Procedure: LOWER EXTREMITY ANGIOGRAPHY;  Surgeon: Algernon Huxley, MD;  Location: [place] CV LAB;  Service: Cardiovascular;  Laterality: Right;  . MOUTH SURGERY Left 07/14/2017  . TOTAL KNEE ARTHROPLASTY Left 09/27/2019   Procedure: TOTAL KNEE ARTHROPLASTY;  Surgeon: [name], MD;  Location: [facility] ORS;  Service: Orthopedics;  Laterality: Left;    Current Medications: Current Meds  Medication Sig  . albuterol (PROVENTIL HFA;VENTOLIN HFA) 108 (90 Base) MCG/ACT inhaler Inhale 2 puffs into the lungs every 6 (six) hours as needed for wheezing or shortness of breath.  [name] albuterol (PROVENTIL) (2.5 MG/3ML) 0.083% nebulizer solution Take 2.5 mg every 4 (four) hours as needed by nebulization for wheezing or shortness of breath.  [name] amLODipine (NORVASC) 5 MG tablet Take 1 tablet (5 mg total) by mouth daily.  [name] atorvastatin (LIPITOR) 40 MG tablet Take 1 tablet (40 mg total) by mouth daily.  . B-D UF III MINI PEN NEEDLES 31G X 5 MM MISC USE WITH INSULIN PEN INJECTIONS TWICE DAILY  . clopidogrel (PLAVIX) 75 MG tablet Take 1 tablet (75 mg total) by mouth daily.  . Dupilumab (DUPIXENT) 300 MG/2ML SOPN Inject 300 mg into the skin every 14 (fourteen) days.   . ferrous sulfate 325 (65 FE) MG tablet Take 325  mg by mouth daily.  [name] HYDROcodone-acetaminophen (NORCO) 7.5-325 MG tablet Take 1 tablet by mouth every 6 (six) hours as needed for moderate pain or severe pain.  Derrill Memo ON 10/04/2020] HYDROcodone-acetaminophen (NORCO) 7.5-325 MG tablet Take 1 tablet by mouth every 6 (six) hours as needed for moderate pain or severe pain.  . isosorbide mononitrate (IMDUR) 30 MG 24 hr tablet Take 1 tablet by mouth twice daily  . LANTUS SOLOSTAR 100 UNIT/ML Solostar Pen Inject 30-40 Units into the skin See admin instructions. Inject 30 units subcutaneously in the morning at 40 units subcutaneously at bedtime.  . levETIRAcetam (KEPPRA) 750 MG tablet Take 750 mg by mouth 2 (two) times daily.  [name] losartan (COZAAR) 100 MG tablet Take 1 tablet (100 mg total) by mouth daily.  . meloxicam (MOBIC) 7.5 MG tablet Take 7.5 mg by mouth daily as needed for  pain.   . montelukast (SINGULAIR) 10 MG tablet Take 10 mg by mouth daily.   . nitroGLYCERIN (NITROSTAT) 0.4 MG SL tablet DISSOLVE ONE TABLET UNDER THE TONGUE EVERY 5 MINUTES AS NEEDED FOR CHEST PAIN.  DO NOT EXCEED A TOTAL OF 3 DOSES IN 15 MINUTES  . NOVOLOG FLEXPEN 100 UNIT/ML FlexPen Inject 10 Units into the skin 3 (three) times daily with meals.  . ondansetron (ZOFRAN) 4 MG tablet Take 4 mg by mouth as needed.  . potassium chloride SA (KLOR-CON) 20 MEQ tablet Take 2 tablets (40 mEq total) by mouth 2 (two) times daily. Take extra 2 tablets (40 meq) if you take metolazone  . sertraline (ZOLOFT) 100 MG tablet Take 100 mg by mouth at bedtime.  [name] tiotropium (SPIRIVA) 18 MCG inhalation capsule Place 18 mcg into inhaler and inhale daily.  [name] torsemide (DEMADEX) 20 MG tablet Take 1 tablet (20 mg) by mouth twice daily  . traZODone (DESYREL) 50 MG tablet Take 50 mg by mouth at bedtime.  . [DISCONTINUED] esomeprazole (NEXIUM) 40 MG capsule Take 1 capsule (40 mg total) by mouth 2 (two) times daily.     Allergies:   Aspirin   Social History   Socioeconomic History  . Marital status:  Widowed    Spouse name: Not on file  . Number of children: Not on file  . Years of education: Not on file  . Highest education level: Not on file  Occupational History  . Not on file  Tobacco Use  . Smoking status: Former Smoker    Packs/day: 0.25    Years: 20.00    Pack years: 5.00    Types: Cigarettes    Quit date: 07/01/2006    Years since quitting: 14.2  . Smokeless tobacco: Never Used  Vaping Use  . Vaping Use: Never used  Substance and Sexual Activity  . Alcohol use: No    Alcohol/week: 0.0 standard drinks  . Drug use: No  . Sexual activity: Yes  Other Topics Concern  . Not on file  Social History Narrative   Lives locally.  Does not routinely exercise.   Social Determinants of Health   Financial Resource Strain: Not on file  Food Insecurity: Not on file  Transportation Needs: Not on file  Physical Activity: Not on file  Stress: Not on file  Social Connections: Not on file     Family History: The patient's *family history includes Cancer in her sister; Depression in her mother; Diabetes in her brother, mother, and another family member; Heart attack in her mother and sister; Heart disease in her mother; Heart failure in her mother and sister; Hypertension in her brother, mother, sister, and another family member; SIDS in her sister; Stroke in her mother.  ROS:   Please see the history of present illness.     All other systems reviewed and are negative.  EKGs/Labs/Other Studies Reviewed:    The following studies were reviewed today:  Echo 08/02/20 1. Left ventricular ejection fraction, by estimation, is 55 to 60%. Left  ventricular ejection fraction by 3D volume is 58 %. The left ventricle has  normal function. The left ventricle has no regional wall motion  abnormalities. There is mild left  ventricular hypertrophy. Left ventricular diastolic parameters are  consistent with Grade I diastolic dysfunction (impaired relaxation).  2. Right ventricular systolic  function is normal. The right ventricular  size is normal. There is normal pulmonary artery systolic pressure.  3. Left atrial size was mildly dilated.  4. The mitral valve is grossly normal. Mild mitral valve regurgitation.  5. Tricuspid valve regurgitation is mild to moderate.  6. The aortic valve is tricuspid. Aortic valve regurgitation is not  visualized. Mild to moderate aortic valve sclerosis/calcification is  present, without any evidence of aortic stenosis.  7. The inferior vena cava is normal in size with greater than 50%  respiratory variability, suggesting right atrial pressure of 3 mmHg.   Heart monitor  Patch Wear Time: 11 days and 3 hours (2022-03-18T11:58:31-0400 to 2022-03-29T15:13:51-0400)  Patient had a min HR of 40 bpm, max HR of 143 bpm, and avg HR of 54 bpm. Predominant underlying rhythm was Sinus Rhythm. 1 run of Ventricular Tachycardia occurred lasting 4 beats with a max rate of 141 bpm (avg 127 bpm). 3 Supraventricular Tachycardia  runs occurred, the run with the fastest interval lasting 4 beats with a max rate of 143 bpm, the longest lasting 4 beats with an avg rate of 103 bpm. Isolated SVEs were rare (<1.0%), SVE Couplets were rare (<1.0%), and SVE Triplets were rare (<1.0%).  Isolated VEs were rare (<1.0%, 1969), VE Couplets were rare (<1.0%, 331), and VE Triplets were rare (<1.0%, 7). Ventricular Bigeminy and Trigeminy were present.    Echo 2020  1. Left ventricular ejection fraction, by visual estimation, is 60 to  65%. The left ventricle has normal function. Normal left ventricular size.  There is mildly increased left ventricular hypertrophy.  2. Left ventricular diastolic Doppler parameters are consistent with  impaired relaxation pattern of LV diastolic filling.  3. Global right ventricle has normal systolic function.The right  ventricular size is mildly enlarged. No increase in right ventricular wall  thickness.  4. Left atrial size was  mildly dilated.  5. Moderate mitral valve regurgitation.  6. The tricuspid valve is normal in structure. Tricuspid valve  regurgitation moderate.  7. Moderately elevated pulmonary artery systolic pressure.  8. The tricuspid regurgitant velocity is 2.83 m/s, and with an assumed  right atrial pressure of 15 mmHg, the estimated right ventricular systolic  pressure is moderately elevated at 47.0   EKG:  EKG is not ordered today.    Recent Labs: 07/02/2020: ALT 14 07/13/2020: TSH 2.640 09/19/2020: BUN 17; Creatinine, Ser 0.85; Hemoglobin 9.9; Platelets 272; Potassium 3.9; Sodium 142  Recent Lipid Panel    Component Value Date/Time   CHOL 120 01/30/2017 0404   TRIG 73 01/30/2017 0404   HDL 47 01/30/2017 0404   CHOLHDL 2.6 01/30/2017 0404   VLDL 15 01/30/2017 0404   LDLCALC 58 01/30/2017 0404     Physical Exam:    VS:  BP (!) 156/70   Pulse (!) 51   Ht 5\' 11"  (1.803 m)   Wt 272 lb (123.4 kg)   BMI 37.94 kg/m     Wt Readings from Last 3 Encounters:  09/19/20 272 lb (123.4 kg)  08/03/20 260 lb (117.9 kg)  07/13/20 266 lb (120.7 kg)     GEN:  Well nourished, well developed in no acute distress HEENT: Normal NECK: No JVD; No carotid bruits LYMPHATICS: No lymphadenopathy CARDIAC: RRR, +murmur, rubs, gallops RESPIRATORY:  Diffusely diminished ABDOMEN: Soft, non-tender, non-distended MUSCULOSKELETAL: Trace lower leg edema; No deformity  SKIN: Warm and dry NEUROLOGIC:  Alert and oriented x 3 PSYCHIATRIC:  Normal affect   ASSESSMENT:    1. Hx of CABG   2. Coronary artery disease involving native coronary artery of native heart with angina pectoris ([place])   3. Chronic diastolic congestive heart failure (  [place])   4. Essential hypertension   5. Fatigue, unspecified type    PLAN:    In order of problems listed above:  Chronic diastolic CHF Valvular disease She has been taking Torsemide 20mg  BID. Weights up today, but patient says she has been eating a lot more at home.  Trace edema on exam and no JVD noted, lung sounds diffusely diminished. Previous labs showed she was dry and metolazone was discontinued. RedsVest 34% today. BMET today. No BB with bradycardia.   Atypical chest pain CAD s/p CABG and remote stenting She had an episode of chest pain last week that felt the same as acid-reflux. She took Nitro with minimal relief. Says she ran out of her Nexium, I will refill this. She had recent Lexiscan stress test that was low risk and normal with EF 55-60%. No further ischemic evaluation at this time. Continue Aspirin, plavix, Imdur, statin. No BB as above.   HTN BP elevated today, but she has not had morning medications. Continue current regimen.   Morbid Obesity Was originally losing weights, but weight is up today to 272lbs. At PCP 2 weeks ago it was 271lbs. She says her appetite has been up and she is eating a lot more.  Generalized fatigue Unclear etiology. She did have diarrhea and stomach pain 2 weeks ago, seen by PCP. Symptoms have since resolved and she has been eating a lot more. I will check CBC and BMET today. TSH 2 months ago was normal. Suspect some of this is from generalized deconditioning and lack of sleep.   Disposition: Follow up in 3 month(s) with APP/MD    Signed, Lexx Monte Ninfa Meeker, PA-C  09/19/2020 11:58 AM    [place]

## 2020-09-19 ENCOUNTER — Other Ambulatory Visit: Payer: Self-pay

## 2020-09-19 ENCOUNTER — Ambulatory Visit (INDEPENDENT_AMBULATORY_CARE_PROVIDER_SITE_OTHER): Payer: Medicare HMO | Admitting: Nurse Practitioner

## 2020-09-19 ENCOUNTER — Encounter (INDEPENDENT_AMBULATORY_CARE_PROVIDER_SITE_OTHER): Payer: Self-pay | Admitting: Nurse Practitioner

## 2020-09-19 ENCOUNTER — Ambulatory Visit (INDEPENDENT_AMBULATORY_CARE_PROVIDER_SITE_OTHER): Payer: Medicare HMO | Admitting: Medical

## 2020-09-19 ENCOUNTER — Other Ambulatory Visit
Admission: RE | Admit: 2020-09-19 | Discharge: 2020-09-19 | Disposition: A | Discharge status: Home / Self Care | Payer: Medicare HMO | Source: Ambulatory Visit | Attending: Medical | Admitting: Medical

## 2020-09-19 ENCOUNTER — Encounter: Payer: Self-pay | Admitting: Medical

## 2020-09-19 VITALS — BP 156/70 | HR 51 | Ht 71.0 in | Wt 272.0 lb

## 2020-09-19 VITALS — BP 165/67 | HR 50 | Resp 16 | Wt 275.0 lb

## 2020-09-19 DIAGNOSIS — E119 Type 2 diabetes mellitus without complications: Secondary | ICD-10-CM

## 2020-09-19 DIAGNOSIS — I10 Essential (primary) hypertension: Secondary | ICD-10-CM | POA: Diagnosis not present

## 2020-09-19 DIAGNOSIS — I7025 Atherosclerosis of native arteries of other extremities with ulceration: Secondary | ICD-10-CM

## 2020-09-19 DIAGNOSIS — Z951 Presence of aortocoronary bypass graft: Secondary | ICD-10-CM | POA: Diagnosis not present

## 2020-09-19 DIAGNOSIS — I5032 Chronic diastolic (congestive) heart failure: Secondary | ICD-10-CM | POA: Insufficient documentation

## 2020-09-19 DIAGNOSIS — I25119 Atherosclerotic heart disease of native coronary artery with unspecified angina pectoris: Secondary | ICD-10-CM | POA: Insufficient documentation

## 2020-09-19 DIAGNOSIS — Z794 Long term (current) use of insulin: Secondary | ICD-10-CM

## 2020-09-19 DIAGNOSIS — R5383 Other fatigue: Secondary | ICD-10-CM

## 2020-09-19 DIAGNOSIS — M7989 Other specified soft tissue disorders: Secondary | ICD-10-CM

## 2020-09-19 LAB — CBC
HCT: 33 % — ABNORMAL LOW (ref 36.0–46.0)
Hemoglobin: 9.9 g/dL — ABNORMAL LOW (ref 12.0–15.0)
MCH: 25.3 pg — ABNORMAL LOW (ref 26.0–34.0)
MCHC: 30 g/dL (ref 30.0–36.0)
MCV: 84.4 fL (ref 80.0–100.0)
Platelets: 272 10*3/uL (ref 150–400)
RBC: 3.91 MIL/uL (ref 3.87–5.11)
RDW: 14.8 % (ref 11.5–15.5)
WBC: 5.9 10*3/uL (ref 4.0–10.5)
nRBC: 0 % (ref 0.0–0.2)

## 2020-09-19 LAB — BASIC METABOLIC PANEL
Anion gap: 10 (ref 5–15)
BUN: 17 mg/dL (ref 8–23)
CO2: 23 mmol/L (ref 22–32)
Calcium: 9.1 mg/dL (ref 8.9–10.3)
Chloride: 109 mmol/L (ref 98–111)
Creatinine, Ser: 0.85 mg/dL (ref 0.44–1.00)
GFR, Estimated: >60 mL/min (ref >60)
Glucose, Bld: 56 mg/dL — ABNORMAL LOW (ref 70–99)
Potassium: 3.9 mmol/L (ref 3.5–5.1)
Sodium: 142 mmol/L (ref 135–145)

## 2020-09-19 MED ORDER — ESOMEPRAZOLE MAGNESIUM 40 MG PO CPDR: 40.0000 mg | DELAYED_RELEASE_CAPSULE | Freq: Two times a day (BID) | ORAL | 0 refills | Status: DC

## 2020-09-19 NOTE — Patient Instructions (Signed)
Medication Instructions:   Your physician recommends that you continue on your current medications as directed. Please refer to the Current Medication list given to you today.  *If you need a refill on your cardiac medications before your next appointment, please call your pharmacy*   Lab Work: BMP, CBC to be drawn today  If you have labs (blood work) drawn today and your tests are completely normal, you will receive your results only by: [name] MyChart Message (if you have MyChart) OR . A paper copy in the mail If you have any lab test that is abnormal or we need to change your treatment, we will call you to review the results.   Testing/Procedures: None ordered   Follow-Up: At [facility], you and your health needs are our priority.  As part of our continuing mission to provide you with exceptional heart care, we have created designated Provider Care Teams.  These Care Teams include your primary Cardiologist (physician) and Advanced Practice Providers (APPs -  Physician Assistants and Nurse Practitioners) who all work together to provide you with the care you need, when you need it.  We recommend signing up for the patient portal called "MyChart".  Sign up information is provided on this After Visit Summary.  MyChart is used to connect with patients for Virtual Visits (Telemedicine).  Patients are able to view lab/test results, encounter notes, upcoming appointments, etc.  Non-urgent messages can be sent to your provider as well.   To learn more about what you can do with MyChart, go to [email or web address].    Your next appointment:   3 month(s)  The format for your next appointment:   In Person  Provider:   You may see Ida Rogue, MD or one of the following Advanced Practice Providers on your designated Care Team:    Murray Hodgkins, NP  Christell Faith, PA-C  Marrianne Mood, PA-C  Cadence [place], [place]  Laurann [place], NP    Other Instructions

## 2020-09-30 ENCOUNTER — Encounter (INDEPENDENT_AMBULATORY_CARE_PROVIDER_SITE_OTHER): Payer: Self-pay | Admitting: Nurse Practitioner

## 2020-09-30 NOTE — Progress Notes (Signed)
Subjective:    Patient ID: Tina Hayes, female    DOB: 1945/06/15, 75 y.o.   MRN: [id number] Chief Complaint  Patient presents with  . Follow-up    Est 2021 ref chronic venous insufficiency    Patient is seen for evaluation of leg pain and leg swelling. The patient first noticed the swelling remotely. The swelling is associated with pain and discoloration. The pain and swelling worsens with prolonged dependency and improves with elevation. The pain is unrelated to activity.  The patient notes that in the morning the legs are significantly improved but they steadily worsened throughout the course of the day. The patient also notes a steady worsening of the discoloration in the ankle and shin area.   The patient denies claudication symptoms.   The patient denies and extensive history of DJD and LS spine disease.  The patient has no had any past angiography, interventions or vascular surgery.  Elevation makes the leg symptoms better, dependency makes them much worse. There is no history of ulcerations. The patient denies any recent changes in medications.  The patient has not been wearing graduated compression.  The patient denies a history of DVT or PE. There is no prior history of phlebitis. There is no history of primary lymphedema.  No history of malignancies. No history of trauma or groin or pelvic surgery. There is no history of radiation treatment to the groin or pelvis  The patient denies amaurosis fugax or recent TIA symptoms. There are no recent neurological changes noted. The patient denies recent episodes of angina or shortness of breath  Previous studies done in 2021 did show evidence of atherosclerotic disease.  Back angiogram was recommended however the patient did not follow-up with our office for scheduling.   Review of Systems  Cardiovascular: Positive for leg swelling.  Musculoskeletal: Positive for gait problem.  All other systems reviewed and are negative.       Objective:   Physical Exam Vitals reviewed.  HENT:     Head: Normocephalic.  Cardiovascular:     Rate and Rhythm: Normal rate.     Pulses: Normal pulses.  Pulmonary:     Effort: Pulmonary effort is normal.  Musculoskeletal:     Right lower leg: 2+ Pitting Edema present.     Left lower leg: 2+ Pitting Edema present.  Skin:    General: Skin is warm and dry.  Neurological:     Mental Status: She is alert and oriented to person, place, and time.     Motor: Weakness present.     Gait: Gait abnormal.  Psychiatric:        Mood and Affect: Mood normal.        Behavior: Behavior normal.        Thought Content: Thought content normal.        Judgment: Judgment normal.     BP (!) 165/67 (BP Location: Left Arm)   Pulse (!) 50   Resp 16   Wt 275 lb (124.7 kg)   BMI 38.35 kg/m   Past Medical History:  Diagnosis Date  . (HFpEF) heart failure with preserved ejection fraction ([place])    a. 05/2016 Echo: EF 60-65%, mild to mod LVH, Gr1 DD, mild MR, mildly dil LA, mod TR, mildly to mod increased PASP.  [name] Acute diastolic heart failure ([place]) 01/27/2017  . Anxiety   . Arthritis   . Chest pain 06/16/2016  . CHF (congestive heart failure) ([place])   . Chronic back pain   .  Chronic diastolic congestive heart failure ([place]) 02/13/2017  . COPD (chronic obstructive pulmonary disease) ([place])   . Coronary artery disease    a. s/p remote PCI x 5;  b. 2006 s/p CABG x 3 ([place]); b. 05/2016 MV: attenuation corrected images w/o ischemia or wma-->Med rx.  . Coronary artery disease of native artery of native heart with stable angina pectoris ([place]) 06/17/2016  . Depression   . Diabetes mellitus without complication ([place])   . Essential hypertension 06/30/2016  . GERD (gastroesophageal reflux disease)   . Heart attack ([place])    Total of 3 per pt.  . Hypertension   . Hypertensive urgency 06/03/2015  . Seizure ([place])   . Seizures ([place])     Social History   Socioeconomic  History  . Marital status: Widowed    Spouse name: Not on file  . Number of children: Not on file  . Years of education: Not on file  . Highest education level: Not on file  Occupational History  . Not on file  Tobacco Use  . Smoking status: Former Smoker    Packs/day: 0.25    Years: 20.00    Pack years: 5.00    Types: Cigarettes    Quit date: 07/01/2006    Years since quitting: 14.2  . Smokeless tobacco: Never Used  Vaping Use  . Vaping Use: Never used  Substance and Sexual Activity  . Alcohol use: No    Alcohol/week: 0.0 standard drinks  . Drug use: No  . Sexual activity: Yes  Other Topics Concern  . Not on file  Social History Narrative   Lives locally.  Does not routinely exercise.   Social Determinants of Health   Financial Resource Strain: Not on file  Food Insecurity: Not on file  Transportation Needs: Not on file  Physical Activity: Not on file  Stress: Not on file  Social Connections: Not on file  Intimate Partner Violence: Not on file    Past Surgical History:  Procedure Laterality Date  . ABDOMINAL HYSTERECTOMY    . ANKLE SURGERY Right   . CATARACT EXTRACTION W/ INTRAOCULAR LENS  IMPLANT, BILATERAL    . COLONOSCOPY WITH PROPOFOL N/A 05/04/2017   Procedure: COLONOSCOPY WITH PROPOFOL;  Surgeon: [name], MD;  Location: [facility] ENDOSCOPY;  Service: Endoscopy;  Laterality: N/A;  . CORONARY ANGIOPLASTY    . CORONARY ARTERY BYPASS GRAFT     TRIPLE BYPASS  . ESOPHAGOGASTRODUODENOSCOPY (EGD) WITH PROPOFOL N/A 05/04/2017   Procedure: ESOPHAGOGASTRODUODENOSCOPY (EGD) WITH PROPOFOL;  Surgeon: [name], MD;  Location: [facility] ENDOSCOPY;  Service: Endoscopy;  Laterality: N/A;  . EXCISION MASS LOWER EXTREMETIES Right 03/02/2018   Procedure: EXCISION SOFT TISSUE MASS FROM MEDIAL ASPECT OF RIGHT ANKLE;  Surgeon: [name], MD;  Location: [facility] ORS;  Service: Orthopedics;  Laterality: Right;  . FRACTURE SURGERY    . JOINT REPLACEMENT     right  . KNEE  SURGERY Right   . LOWER EXTREMITY ANGIOGRAPHY Right 07/14/2019   Procedure: LOWER EXTREMITY ANGIOGRAPHY;  Surgeon: Algernon Huxley, MD;  Location: [place] CV LAB;  Service: Cardiovascular;  Laterality: Right;  . MOUTH SURGERY Left 07/14/2017  . TOTAL KNEE ARTHROPLASTY Left 09/27/2019   Procedure: TOTAL KNEE ARTHROPLASTY;  Surgeon: [name], MD;  Location: [facility] ORS;  Service: Orthopedics;  Laterality: Left;    Family History  Problem Relation Age of Onset  . Diabetes Other   . Hypertension Other   . Diabetes  Mother   . Heart failure Mother   . Heart disease Mother   . Heart attack Mother   . Stroke Mother   . Depression Mother   . Hypertension Mother   . Cancer Sister        brain  . Hypertension Sister   . Diabetes Brother   . Hypertension Brother   . Heart failure Sister   . Heart attack Sister   . SIDS Sister     Allergies  Allergen Reactions  . Aspirin Anaphylaxis, Shortness Of Breath and Swelling    LIPS AND THROAT SWELL, DIFFICULT TO BREATH    CBC Latest Ref Rng & Units 09/19/2020 07/13/2020 07/02/2020  WBC 4.0 - 10.5 K/uL 5.9 4.9 5.5  Hemoglobin 12.0 - 15.0 g/dL 9.9(L) 9.5(L) 9.1(L)  Hematocrit 36.0 - 46.0 % 33.0(L) 30.9(L) 29.4(L)  Platelets 150 - 400 K/uL 272 293 234      CMP     Component Value Date/Time   NA 142 09/19/2020 0946   NA 144 07/13/2020 1152   K 3.9 09/19/2020 0946   CL 109 09/19/2020 0946   CO2 23 09/19/2020 0946   GLUCOSE 56 (L) 09/19/2020 0946   BUN 17 09/19/2020 0946   BUN 22 07/13/2020 1152   CREATININE 0.85 09/19/2020 0946   CALCIUM 9.1 09/19/2020 0946   PROT 7.0 07/02/2020 1929   ALBUMIN 3.7 07/02/2020 1929   AST 15 07/02/2020 1929   ALT 14 07/02/2020 1929   ALKPHOS 127 (H) 07/02/2020 1929   BILITOT 0.6 07/02/2020 1929   GFRNONAA >60 09/19/2020 0946   GFRAA >60 09/30/2019 0622     No results found.     Assessment & Plan:   1. Atherosclerosis of native arteries of the extremities with ulceration ([place]) Today the  patient does not have any ulceration however she does endorse pain in her lower extremities.  Previous studies show wounds atherosclerotic disease right lower extremity however the patient notes both of her legs hurting fairly equally.  We will repeat noninvasive studies since it has been over a year since her last ones were done.  2. Type 2 diabetes mellitus without complication, with long-term current use of insulin ([place]) Continue hypoglycemic medications as already ordered, these medications have been reviewed and there are no changes at this time.  Hgb A1C to be monitored as already arranged by primary service   3. Essential hypertension Continue antihypertensive medications as already ordered, these medications have been reviewed and there are no changes at this time.   4. Chronic diastolic congestive heart failure ([place]) This certainly could be contributing to her swelling   5. Leg swelling Discussed the conservative measures of controlling edema with patient.  This includes compression socks, elevation and activity.  However the pain in her legs makes it difficult for her to do any activity.  We will have patient follow-up at her convenience   Current Outpatient Medications on File Prior to Visit  Medication Sig Dispense Refill  . albuterol (PROVENTIL HFA;VENTOLIN HFA) 108 (90 Base) MCG/ACT inhaler Inhale 2 puffs into the lungs every 6 (six) hours as needed for wheezing or shortness of breath. 1 Inhaler 2  . albuterol (PROVENTIL) (2.5 MG/3ML) 0.083% nebulizer solution Take 2.5 mg every 4 (four) hours as needed by nebulization for wheezing or shortness of breath.    [name] amLODipine (NORVASC) 5 MG tablet Take 1 tablet (5 mg total) by mouth daily. 30 tablet 6  . atorvastatin (LIPITOR) 40 MG tablet Take 1 tablet (40  mg total) by mouth daily. 90 tablet 1  . B-D UF III MINI PEN NEEDLES 31G X 5 MM MISC USE WITH INSULIN PEN INJECTIONS TWICE DAILY    . clopidogrel (PLAVIX) 75 MG tablet Take 1  tablet (75 mg total) by mouth daily. 90 tablet 3  . Dupilumab (DUPIXENT) 300 MG/2ML SOPN Inject 300 mg into the skin every 14 (fourteen) days.     [name] esomeprazole (NEXIUM) 40 MG capsule Take 1 capsule (40 mg total) by mouth 2 (two) times daily. 90 capsule 0  . ferrous sulfate 325 (65 FE) MG tablet Take 325 mg by mouth daily.    [name] HYDROcodone-acetaminophen (NORCO) 7.5-325 MG tablet Take 1 tablet by mouth every 6 (six) hours as needed for moderate pain or severe pain. 120 tablet 0  . [START ON 10/04/2020] HYDROcodone-acetaminophen (NORCO) 7.5-325 MG tablet Take 1 tablet by mouth every 6 (six) hours as needed for moderate pain or severe pain. 120 tablet 0  . isosorbide mononitrate (IMDUR) 30 MG 24 hr tablet Take 1 tablet by mouth twice daily 180 tablet 0  . LANTUS SOLOSTAR 100 UNIT/ML Solostar Pen Inject 30-40 Units into the skin See admin instructions. Inject 30 units subcutaneously in the morning at 40 units subcutaneously at bedtime.    . levETIRAcetam (KEPPRA) 750 MG tablet Take 750 mg by mouth 2 (two) times daily.    [name] losartan (COZAAR) 100 MG tablet Take 1 tablet (100 mg total) by mouth daily. 30 tablet 11  . meloxicam (MOBIC) 7.5 MG tablet Take 7.5 mg by mouth daily as needed for pain.     . montelukast (SINGULAIR) 10 MG tablet Take 10 mg by mouth daily.     . nitroGLYCERIN (NITROSTAT) 0.4 MG SL tablet DISSOLVE ONE TABLET UNDER THE TONGUE EVERY 5 MINUTES AS NEEDED FOR CHEST PAIN.  DO NOT EXCEED A TOTAL OF 3 DOSES IN 15 MINUTES 25 tablet 0  . NOVOLOG FLEXPEN 100 UNIT/ML FlexPen Inject 10 Units into the skin 3 (three) times daily with meals.    . ondansetron (ZOFRAN) 4 MG tablet Take 4 mg by mouth as needed.    . potassium chloride SA (KLOR-CON) 20 MEQ tablet Take 2 tablets (40 mEq total) by mouth 2 (two) times daily. Take extra 2 tablets (40 meq) if you take metolazone 140 tablet 5  . sertraline (ZOLOFT) 100 MG tablet Take 100 mg by mouth at bedtime.    [name] tiotropium (SPIRIVA) 18 MCG inhalation  capsule Place 18 mcg into inhaler and inhale daily.    [name] torsemide (DEMADEX) 20 MG tablet Take 1 tablet (20 mg) by mouth twice daily    . traZODone (DESYREL) 50 MG tablet Take 50 mg by mouth at bedtime.     No current facility-administered medications on file prior to visit.    There are no Patient Instructions on file for this visit. No follow-ups on file.   Kris Hartmann, NP

## 2020-10-03 ENCOUNTER — Ambulatory Visit (INDEPENDENT_AMBULATORY_CARE_PROVIDER_SITE_OTHER): Payer: Medicare HMO | Admitting: Nurse Practitioner

## 2020-10-03 ENCOUNTER — Encounter (INDEPENDENT_AMBULATORY_CARE_PROVIDER_SITE_OTHER): Payer: Medicare HMO

## 2020-10-11 ENCOUNTER — Other Ambulatory Visit (INDEPENDENT_AMBULATORY_CARE_PROVIDER_SITE_OTHER): Payer: Self-pay | Admitting: Nurse Practitioner

## 2020-10-11 DIAGNOSIS — I7025 Atherosclerosis of native arteries of other extremities with ulceration: Secondary | ICD-10-CM

## 2020-10-11 DIAGNOSIS — M7989 Other specified soft tissue disorders: Secondary | ICD-10-CM

## 2020-10-11 DIAGNOSIS — Z95828 Presence of other vascular implants and grafts: Secondary | ICD-10-CM

## 2020-10-12 ENCOUNTER — Ambulatory Visit (INDEPENDENT_AMBULATORY_CARE_PROVIDER_SITE_OTHER): Payer: 59 | Admitting: Nurse Practitioner

## 2020-10-12 ENCOUNTER — Other Ambulatory Visit: Payer: Self-pay

## 2020-10-12 ENCOUNTER — Ambulatory Visit (INDEPENDENT_AMBULATORY_CARE_PROVIDER_SITE_OTHER): Payer: 59

## 2020-10-12 VITALS — BP 177/72 | HR 50 | Resp 16

## 2020-10-12 DIAGNOSIS — I7025 Atherosclerosis of native arteries of other extremities with ulceration: Secondary | ICD-10-CM | POA: Diagnosis not present

## 2020-10-12 DIAGNOSIS — I5032 Chronic diastolic (congestive) heart failure: Secondary | ICD-10-CM | POA: Diagnosis not present

## 2020-10-12 DIAGNOSIS — Z95828 Presence of other vascular implants and grafts: Secondary | ICD-10-CM

## 2020-10-12 DIAGNOSIS — Z794 Long term (current) use of insulin: Secondary | ICD-10-CM

## 2020-10-12 DIAGNOSIS — M7989 Other specified soft tissue disorders: Secondary | ICD-10-CM | POA: Diagnosis not present

## 2020-10-12 DIAGNOSIS — E119 Type 2 diabetes mellitus without complications: Secondary | ICD-10-CM

## 2020-10-12 DIAGNOSIS — I10 Essential (primary) hypertension: Secondary | ICD-10-CM | POA: Diagnosis not present

## 2020-10-13 ENCOUNTER — Encounter (INDEPENDENT_AMBULATORY_CARE_PROVIDER_SITE_OTHER): Payer: Self-pay | Admitting: Nurse Practitioner

## 2020-10-13 NOTE — Progress Notes (Signed)
Subjective:    Patient ID: Tina Hayes, female    DOB: [birth year], 75 y.o.   MRN: [id number] Chief Complaint  Patient presents with   Follow-up    Ultrasound follow up    The patient returns to the office for followup evaluation regarding leg swelling and pain.  The swelling has improved quite a bit and the pain associated with swelling has decreased substantially. There have not been any interval development of a ulcerations or wounds.  The patient notes that most of her swelling originated after medication changes concerning her diuretics.  However with elevation the swelling has greatly decreased.  The only pain that she notes tends to be within her knee areas.  Today noninvasive studies show a right ABI 1.24 and a left of 1.35.  Her previous ABI was 0.55 on the right and 0.99 on the left.  The patient also underwent a bilateral lower extremity venous reflux study which shows no evidence of DVT or superficial thrombophlebitis seen bilaterally.  No evidence of deep venous insufficiency seen bilaterally no evidence of superficial venous reflux seen bilaterally.  It is also noted that the right great saphenous vein was previously removed for CABG.       Review of Systems  Cardiovascular:  Positive for leg swelling.  Neurological:  Positive for weakness.  All other systems reviewed and are negative.     Objective:   Physical Exam Vitals reviewed.  Constitutional:      Appearance: She is obese.  HENT:     Head: Normocephalic.  Cardiovascular:     Rate and Rhythm: Normal rate.     Pulses: Decreased pulses.  Pulmonary:     Effort: Pulmonary effort is normal.  Musculoskeletal:     Right lower leg: 1+ Edema present.     Left lower leg: 1+ Edema present.  Skin:    General: Skin is warm and dry.  Neurological:     Mental Status: She is alert and oriented to person, place, and time.  Psychiatric:        Mood and Affect: Mood normal.        Behavior: Behavior normal.         Thought Content: Thought content normal.        Judgment: Judgment normal.    BP (!) 177/72 (BP Location: Left Arm)   Pulse (!) 50   Resp 16   Past Medical History:  Diagnosis Date   (HFpEF) heart failure with preserved ejection fraction ([place])    a. 05/2016 Echo: EF 60-65%, mild to mod LVH, Gr1 DD, mild MR, mildly dil LA, mod TR, mildly to mod increased PASP.   Acute diastolic heart failure ([facility]) 01/27/2017   Anxiety    Arthritis    Chest pain 06/16/2016   CHF (congestive heart failure) ([facility])    Chronic back pain    Chronic diastolic congestive heart failure ([place]) 02/13/2017   COPD (chronic obstructive pulmonary disease) ([place])    Coronary artery disease    a. s/p remote PCI x 5;  b. 2006 s/p CABG x 3 ([place], [place]); b. 05/2016 MV: attenuation corrected images w/o ischemia or wma-->Med rx.   Coronary artery disease of native artery of native heart with stable angina pectoris ([place]) 06/17/2016   Depression    Diabetes mellitus without complication ([place])    Essential hypertension 06/30/2016   GERD (gastroesophageal reflux disease)    Heart attack ([place])    Total of 3 per  pt.   Hypertension    Hypertensive urgency 06/03/2015   Seizure ([place])    Seizures ([place])     Social History   Socioeconomic History   Marital status: Widowed    Spouse name: Not on file   Number of children: Not on file   Years of education: Not on file   Highest education level: Not on file  Occupational History   Not on file  Tobacco Use   Smoking status: Former    Packs/day: 0.25    Years: 20.00    Pack years: 5.00    Types: Cigarettes    Quit date: 07/01/2006    Years since quitting: 14.2   Smokeless tobacco: Never  Vaping Use   Vaping Use: Never used  Substance and Sexual Activity   Alcohol use: No    Alcohol/week: 0.0 standard drinks   Drug use: No   Sexual activity: Yes  Other Topics Concern   Not on file  Social History Narrative   Lives locally.  Does not routinely  exercise.   Social Determinants of Health   Financial Resource Strain: Not on file  Food Insecurity: Not on file  Transportation Needs: Not on file  Physical Activity: Not on file  Stress: Not on file  Social Connections: Not on file  Intimate Partner Violence: Not on file    Past Surgical History:  Procedure Laterality Date   ABDOMINAL HYSTERECTOMY     ANKLE SURGERY Right    CATARACT EXTRACTION W/ INTRAOCULAR LENS  IMPLANT, BILATERAL     COLONOSCOPY WITH PROPOFOL N/A 05/04/2017   Procedure: COLONOSCOPY WITH PROPOFOL;  Surgeon: [name], MD;  Location: [facility] ENDOSCOPY;  Service: Endoscopy;  Laterality: N/A;   CORONARY ANGIOPLASTY     CORONARY ARTERY BYPASS GRAFT     TRIPLE BYPASS   ESOPHAGOGASTRODUODENOSCOPY (EGD) WITH PROPOFOL N/A 05/04/2017   Procedure: ESOPHAGOGASTRODUODENOSCOPY (EGD) WITH PROPOFOL;  Surgeon: [name], MD;  Location[facility] ENDOSCOPY;  Service: Endoscopy;  Laterality: N/A;   EXCISION MASS LOWER EXTREMETIES Right 03/02/2018   Procedure: EXCISION SOFT TISSUE MASS FROM MEDIAL ASPECT OF RIGHT ANKLE;  Surgeon: [name], MD;  Location: [facility] ORS;  Service: Orthopedics;  Laterality: Right;   FRACTURE SURGERY     JOINT REPLACEMENT     right   KNEE SURGERY Right    LOWER EXTREMITY ANGIOGRAPHY Right 07/14/2019   Procedure: LOWER EXTREMITY ANGIOGRAPHY;  Surgeon: Algernon Huxley, MD;  Location: [place] CV LAB;  Service: Cardiovascular;  Laterality: Right;   MOUTH SURGERY Left 07/14/2017   TOTAL KNEE ARTHROPLASTY Left 09/27/2019   Procedure: TOTAL KNEE ARTHROPLASTY;  Surgeon: [name], MD;  Location: [facility] ORS;  Service: Orthopedics;  Laterality: Left;    Family History  Problem Relation Age of Onset   Diabetes Other    Hypertension Other    Diabetes Mother    Heart failure Mother    Heart disease Mother    Heart attack Mother    Stroke Mother    Depression Mother    Hypertension Mother    Cancer Sister        brain   Hypertension Sister     Diabetes Brother    Hypertension Brother    Heart failure Sister    Heart attack Sister    SIDS Sister     Allergies  Allergen Reactions   Aspirin Anaphylaxis, Shortness Of Breath and Swelling    LIPS AND THROAT SWELL, DIFFICULT TO BREATH    CBC  Latest Ref Rng & Units 09/19/2020 07/13/2020 07/02/2020  WBC 4.0 - 10.5 K/uL 5.9 4.9 5.5  Hemoglobin 12.0 - 15.0 g/dL 9.9(L) 9.5(L) 9.1(L)  Hematocrit 36.0 - 46.0 % 33.0(L) 30.9(L) 29.4(L)  Platelets 150 - 400 K/uL 272 293 234      CMP     Component Value Date/Time   NA 142 09/19/2020 0946   NA 144 07/13/2020 1152   K 3.9 09/19/2020 0946   CL 109 09/19/2020 0946   CO2 23 09/19/2020 0946   GLUCOSE 56 (L) 09/19/2020 0946   BUN 17 09/19/2020 0946   BUN 22 07/13/2020 1152   CREATININE 0.85 09/19/2020 0946   CALCIUM 9.1 09/19/2020 0946   PROT 7.0 07/02/2020 1929   ALBUMIN 3.7 07/02/2020 1929   AST 15 07/02/2020 1929   ALT 14 07/02/2020 1929   ALKPHOS 127 (H) 07/02/2020 1929   BILITOT 0.6 07/02/2020 1929   GFRNONAA >60 09/19/2020 0946   GFRAA >60 09/30/2019 0622     No results found.     Assessment & Plan:   1. Atherosclerosis of native artery of lower extremity with ulceration, unspecified laterality, unspecified ulceration site [facility])  Recommend:  The patient has evidence of atherosclerosis of the lower extremities with claudication.  The patient does not voice lifestyle limiting changes at this point in time.  Noninvasive studies do not suggest clinically significant change.  No invasive studies, angiography or surgery at this time The patient should continue walking and begin a more formal exercise program.  The patient should continue antiplatelet therapy and aggressive treatment of the lipid abnormalities  No changes in the patient's medications at this time  The patient should continue wearing graduated compression socks 10-15 mmHg strength to control the mild edema.    2. Type 2 diabetes mellitus without  complication, with long-term current use of insulin ([place]) Continue hypoglycemic medications as already ordered, these medications have been reviewed and there are no changes at this time.  Hgb A1C to be monitored as already arranged by primary service   3. Essential hypertension Continue antihypertensive medications as already ordered, these medications have been reviewed and there are no changes at this time.   4. Chronic diastolic congestive heart failure ([place]) The patient notes some recent changes in her medications.  Her swelling is much improved as a likely result.   Current Outpatient Medications on File Prior to Visit  Medication Sig Dispense Refill   albuterol (PROVENTIL HFA;VENTOLIN HFA) 108 (90 Base) MCG/ACT inhaler Inhale 2 puffs into the lungs every 6 (six) hours as needed for wheezing or shortness of breath. 1 Inhaler 2   albuterol (PROVENTIL) (2.5 MG/3ML) 0.083% nebulizer solution Take 2.5 mg every 4 (four) hours as needed by nebulization for wheezing or shortness of breath.     amLODipine (NORVASC) 5 MG tablet Take 1 tablet (5 mg total) by mouth daily. 30 tablet 6   atorvastatin (LIPITOR) 40 MG tablet Take 1 tablet (40 mg total) by mouth daily. 90 tablet 1   B-D UF III MINI PEN NEEDLES 31G X 5 MM MISC USE WITH INSULIN PEN INJECTIONS TWICE DAILY     clopidogrel (PLAVIX) 75 MG tablet Take 1 tablet (75 mg total) by mouth daily. 90 tablet 3   Dupilumab (DUPIXENT) 300 MG/2ML SOPN Inject 300 mg into the skin every 14 (fourteen) days.      esomeprazole (NEXIUM) 40 MG capsule Take 1 capsule (40 mg total) by mouth 2 (two) times daily. 90 capsule 0   ferrous  sulfate 325 (65 FE) MG tablet Take 325 mg by mouth daily.     HYDROcodone-acetaminophen (NORCO) 7.5-325 MG tablet Take 1 tablet by mouth every 6 (six) hours as needed for moderate pain or severe pain. 120 tablet 0   isosorbide mononitrate (IMDUR) 30 MG 24 hr tablet Take 1 tablet by mouth twice daily 180 tablet 0   LANTUS SOLOSTAR  100 UNIT/ML Solostar Pen Inject 30-40 Units into the skin See admin instructions. Inject 30 units subcutaneously in the morning at 40 units subcutaneously at bedtime.     levETIRAcetam (KEPPRA) 750 MG tablet Take 750 mg by mouth 2 (two) times daily.     losartan (COZAAR) 100 MG tablet Take 1 tablet (100 mg total) by mouth daily. 30 tablet 11   meloxicam (MOBIC) 7.5 MG tablet Take 7.5 mg by mouth daily as needed for pain.      montelukast (SINGULAIR) 10 MG tablet Take 10 mg by mouth daily.      nitroGLYCERIN (NITROSTAT) 0.4 MG SL tablet DISSOLVE ONE TABLET UNDER THE TONGUE EVERY 5 MINUTES AS NEEDED FOR CHEST PAIN.  DO NOT EXCEED A TOTAL OF 3 DOSES IN 15 MINUTES 25 tablet 0   NOVOLOG FLEXPEN 100 UNIT/ML FlexPen Inject 10 Units into the skin 3 (three) times daily with meals.     ondansetron (ZOFRAN) 4 MG tablet Take 4 mg by mouth as needed.     potassium chloride SA (KLOR-CON) 20 MEQ tablet Take 2 tablets (40 mEq total) by mouth 2 (two) times daily. Take extra 2 tablets (40 meq) if you take metolazone 140 tablet 5   sertraline (ZOLOFT) 100 MG tablet Take 100 mg by mouth at bedtime.     tiotropium (SPIRIVA) 18 MCG inhalation capsule Place 18 mcg into inhaler and inhale daily.     torsemide (DEMADEX) 20 MG tablet Take 1 tablet (20 mg) by mouth twice daily     traZODone (DESYREL) 50 MG tablet Take 50 mg by mouth at bedtime.     No current facility-administered medications on file prior to visit.    There are no Patient Instructions on file for this visit. No follow-ups on file.   Kris Hartmann, NP

## 2020-11-02 ENCOUNTER — Ambulatory Visit: Payer: 59 | Attending: Anesthesiology | Admitting: Anesthesiology

## 2020-11-02 ENCOUNTER — Other Ambulatory Visit: Payer: Self-pay

## 2020-11-02 ENCOUNTER — Encounter: Payer: Self-pay | Admitting: Anesthesiology

## 2020-11-02 DIAGNOSIS — M5431 Sciatica, right side: Secondary | ICD-10-CM

## 2020-11-02 DIAGNOSIS — M4716 Other spondylosis with myelopathy, lumbar region: Secondary | ICD-10-CM

## 2020-11-02 DIAGNOSIS — F1190 Opioid use, unspecified, uncomplicated: Secondary | ICD-10-CM

## 2020-11-02 DIAGNOSIS — M48062 Spinal stenosis, lumbar region with neurogenic claudication: Secondary | ICD-10-CM

## 2020-11-02 DIAGNOSIS — M5136 Other intervertebral disc degeneration, lumbar region: Secondary | ICD-10-CM | POA: Diagnosis not present

## 2020-11-02 DIAGNOSIS — G894 Chronic pain syndrome: Secondary | ICD-10-CM

## 2020-11-02 DIAGNOSIS — M47817 Spondylosis without myelopathy or radiculopathy, lumbosacral region: Secondary | ICD-10-CM

## 2020-11-02 DIAGNOSIS — M170 Bilateral primary osteoarthritis of knee: Secondary | ICD-10-CM

## 2020-11-02 DIAGNOSIS — M5432 Sciatica, left side: Secondary | ICD-10-CM

## 2020-11-02 MED ORDER — HYDROCODONE-ACETAMINOPHEN 7.5-325 MG PO TABS: 1.0000 | ORAL_TABLET | Freq: Four times a day (QID) | ORAL | 0 refills | Status: DC | PRN

## 2020-11-02 NOTE — Progress Notes (Signed)
Virtual Visit via Telephone Note  I connected with Tina Hayes on 11/02/20 at  1:00 PM EDT by telephone and verified that I am speaking with the correct person using two identifiers.  Location: Patient: Home Provider: [facility] control center   I discussed the limitations, risks, security and privacy concerns of performing an evaluation and management service by telephone and the availability of in person appointments. I also discussed with the patient that there may be a patient responsible charge related to this service. The patient expressed understanding and agreed to proceed.   History of Present Illness: I spoke with Tina Hayes over the telephone today as she is unable to do the video portion of the virtual conference.  She states that she is had a recurrence of some fairly severe low back pain and sciatica symptoms.  She is had this in the past and responded favorably to epidurals.  Her last epidural was back in the winter.  She still taking her medications 4 times a day but despite the hydrocodone she continues to have severe low back pain and its been incapacitating.  She rates this as a VAS 7 or 8.  She gets about 25 to 50% relief with the opioid medications but generally this is more dramatic relief after having received epidurals.  She generally gets 50 to 75% relief from the epidurals lasting about 2 to 3 months before she has recurrence of the same baseline pain.  No new changes in lower extremity strength or function of bowel or bladder function are noted at this time.  Review of systems: General: No fevers or chills Pulmonary: No shortness of breath or dyspnea Cardiac: No angina or palpitations or lightheadedness GI: No abdominal pain or constipation Psych: No depression    Observations/Objective:  Current Outpatient Medications:    [START ON 12/01/2020] HYDROcodone-acetaminophen (NORCO) 7.5-325 MG tablet, Take 1 tablet by mouth every 6 (six) hours as needed for moderate pain or  severe pain., Disp: 120 tablet, Rfl: 0   albuterol (PROVENTIL HFA;VENTOLIN HFA) 108 (90 Base) MCG/ACT inhaler, Inhale 2 puffs into the lungs every 6 (six) hours as needed for wheezing or shortness of breath., Disp: 1 Inhaler, Rfl: 2   albuterol (PROVENTIL) (2.5 MG/3ML) 0.083% nebulizer solution, Take 2.5 mg every 4 (four) hours as needed by nebulization for wheezing or shortness of breath., Disp: , Rfl:    amLODipine (NORVASC) 5 MG tablet, Take 1 tablet (5 mg total) by mouth daily., Disp: 30 tablet, Rfl: 6   atorvastatin (LIPITOR) 40 MG tablet, Take 1 tablet (40 mg total) by mouth daily., Disp: 90 tablet, Rfl: 1   B-D UF III MINI PEN NEEDLES 31G X 5 MM MISC, USE WITH INSULIN PEN INJECTIONS TWICE DAILY, Disp: , Rfl:    clopidogrel (PLAVIX) 75 MG tablet, Take 1 tablet (75 mg total) by mouth daily., Disp: 90 tablet, Rfl: 3   Dupilumab (DUPIXENT) 300 MG/2ML SOPN, Inject 300 mg into the skin every 14 (fourteen) days. , Disp: , Rfl:    esomeprazole (NEXIUM) 40 MG capsule, Take 1 capsule (40 mg total) by mouth 2 (two) times daily., Disp: 90 capsule, Rfl: 0   ferrous sulfate 325 (65 FE) MG tablet, Take 325 mg by mouth daily., Disp: , Rfl:    [START ON 11/03/2020] HYDROcodone-acetaminophen (NORCO) 7.5-325 MG tablet, Take 1 tablet by mouth every 6 (six) hours as needed for moderate pain or severe pain., Disp: 120 tablet, Rfl: 0   isosorbide mononitrate (IMDUR) 30 MG 24 hr  tablet, Take 1 tablet by mouth twice daily, Disp: 180 tablet, Rfl: 0   LANTUS SOLOSTAR 100 UNIT/ML Solostar Pen, Inject 30-40 Units into the skin See admin instructions. Inject 30 units subcutaneously in the morning at 40 units subcutaneously at bedtime., Disp: , Rfl:    levETIRAcetam (KEPPRA) 750 MG tablet, Take 750 mg by mouth 2 (two) times daily., Disp: , Rfl:    losartan (COZAAR) 100 MG tablet, Take 1 tablet (100 mg total) by mouth daily., Disp: 30 tablet, Rfl: 11   meloxicam (MOBIC) 7.5 MG tablet, Take 7.5 mg by mouth daily as needed for  pain. , Disp: , Rfl:    montelukast (SINGULAIR) 10 MG tablet, Take 10 mg by mouth daily. , Disp: , Rfl:    nitroGLYCERIN (NITROSTAT) 0.4 MG SL tablet, DISSOLVE ONE TABLET UNDER THE TONGUE EVERY 5 MINUTES AS NEEDED FOR CHEST PAIN.  DO NOT EXCEED A TOTAL OF 3 DOSES IN 15 MINUTES, Disp: 25 tablet, Rfl: 0   NOVOLOG FLEXPEN 100 UNIT/ML FlexPen, Inject 10 Units into the skin 3 (three) times daily with meals., Disp: , Rfl:    ondansetron (ZOFRAN) 4 MG tablet, Take 4 mg by mouth as needed., Disp: , Rfl:    potassium chloride SA (KLOR-CON) 20 MEQ tablet, Take 2 tablets (40 mEq total) by mouth 2 (two) times daily. Take extra 2 tablets (40 meq) if you take metolazone, Disp: 140 tablet, Rfl: 5   sertraline (ZOLOFT) 100 MG tablet, Take 100 mg by mouth at bedtime., Disp: , Rfl:    tiotropium (SPIRIVA) 18 MCG inhalation capsule, Place 18 mcg into inhaler and inhale daily., Disp: , Rfl:    torsemide (DEMADEX) 20 MG tablet, Take 1 tablet (20 mg) by mouth twice daily, Disp: , Rfl:    traZODone (DESYREL) 50 MG tablet, Take 50 mg by mouth at bedtime., Disp: , Rfl:    Assessment and Plan: 1. Bilateral sciatica   2. Lumbar spondylosis with myelopathy   3. Spinal stenosis of lumbar region with neurogenic claudication   4. DDD (degenerative disc disease), lumbar   5. Chronic, continuous use of opioids   6. Chronic pain syndrome   7. Facet arthritis of lumbosacral region   8. Primary osteoarthritis of both knees   Based on the severity of her pain I feel that it would be prudent to proceed with a repeat epidural.  She is on Plavix and I have asked her to discontinue this 7 to 10 days in advance of her epidural procedure.  She has done this in the past as prescribed by her primary care physicians.  We gone over the risks and benefits of the procedure with her in detail.  Furthermore I will refill her medications after review of the [facility] practitioner database information.  She has done well with chronic opioid  therapy with no evidence of diverting or illicit use.  She continues to get good relief from the medicines with no side effects.  These will be dated for July and August with return to clinic in approximate 2 weeks for her epidural.  She is to continue follow-up with her primary care physicians for baseline medical care.  Follow Up Instructions:    I discussed the assessment and treatment plan with the patient. The patient was provided an opportunity to ask questions and all were answered. The patient agreed with the plan and demonstrated an understanding of the instructions.   The patient was advised to call back or seek an in-person evaluation  if the symptoms worsen or if the condition fails to improve as anticipated.  I provided 30 minutes of non-face-to-face time during this encounter.   Molli Barrows, MD

## 2020-11-28 ENCOUNTER — Encounter: Payer: Self-pay | Admitting: Anesthesiology

## 2020-11-28 ENCOUNTER — Other Ambulatory Visit: Payer: Self-pay

## 2020-11-28 ENCOUNTER — Ambulatory Visit: Payer: 59 | Attending: Anesthesiology | Admitting: Anesthesiology

## 2020-11-28 DIAGNOSIS — M5431 Sciatica, right side: Secondary | ICD-10-CM

## 2020-11-28 DIAGNOSIS — M48062 Spinal stenosis, lumbar region with neurogenic claudication: Secondary | ICD-10-CM

## 2020-11-28 DIAGNOSIS — M4716 Other spondylosis with myelopathy, lumbar region: Secondary | ICD-10-CM | POA: Diagnosis not present

## 2020-11-28 DIAGNOSIS — M5432 Sciatica, left side: Secondary | ICD-10-CM

## 2020-11-28 DIAGNOSIS — M47817 Spondylosis without myelopathy or radiculopathy, lumbosacral region: Secondary | ICD-10-CM

## 2020-11-28 DIAGNOSIS — F1190 Opioid use, unspecified, uncomplicated: Secondary | ICD-10-CM

## 2020-11-28 DIAGNOSIS — M5136 Other intervertebral disc degeneration, lumbar region: Secondary | ICD-10-CM

## 2020-11-28 DIAGNOSIS — G894 Chronic pain syndrome: Secondary | ICD-10-CM

## 2020-11-28 MED ORDER — HYDROCODONE-ACETAMINOPHEN 7.5-325 MG PO TABS: 1.0000 | ORAL_TABLET | Freq: Four times a day (QID) | ORAL | 0 refills | Status: DC | PRN

## 2020-11-28 MED ORDER — HYDROCODONE-ACETAMINOPHEN 7.5-325 MG PO TABS: 1.0000 | ORAL_TABLET | Freq: Four times a day (QID) | ORAL | 0 refills | Status: AC | PRN

## 2020-11-28 NOTE — Progress Notes (Signed)
Virtual Visit via Telephone Note  I connected with Tina Hayes on 11/28/20 at  1:45 PM EDT by telephone and verified that I am speaking with the correct person using two identifiers.  Location: Patient: Home Provider: Pain control center   I discussed the limitations, risks, security and privacy concerns of performing an evaluation and management service by telephone and the availability of in person appointments. I also discussed with the patient that there may be a patient responsible charge related to this service. The patient expressed understanding and agreed to proceed.   History of Present Illness: I spoke with Tina Hayes via telephone today as she was unable to come in for her epidural.  She reports that she is continuing to have severe low back pain with occasional popping and cracking in the low back with rotation.  She feels this when she is moving and reports that her lower extremity sciatica type pain has been severe.  It is similar to what she has described in the past and she desires to have an epidural however had to assist with her daughter taking care of a COVID-patient.  She has been off her Plavix and is hoping to get her epidural next week.  She will consult her primary care physician regarding the Plavix and what to do with this in the meantime.  No other changes are reported in her quality characteristic or distribution of back pain and she is doing well with her medication management and continuing to get good relief.  No side effects reported with her opioid medications.  Review of systems: General: No fevers or chills Pulmonary: No shortness of breath or dyspnea Cardiac: No angina or palpitations or lightheadedness GI: No abdominal pain or constipation Psych: No depression    Observations/Objective:  Current Outpatient Medications:    albuterol (PROVENTIL HFA;VENTOLIN HFA) 108 (90 Base) MCG/ACT inhaler, Inhale 2 puffs into the lungs every 6 (six) hours as needed  for wheezing or shortness of breath., Disp: 1 Inhaler, Rfl: 2   albuterol (PROVENTIL) (2.5 MG/3ML) 0.083% nebulizer solution, Take 2.5 mg every 4 (four) hours as needed by nebulization for wheezing or shortness of breath., Disp: , Rfl:    amLODipine (NORVASC) 5 MG tablet, Take 1 tablet (5 mg total) by mouth daily., Disp: 30 tablet, Rfl: 6   atorvastatin (LIPITOR) 40 MG tablet, Take 1 tablet (40 mg total) by mouth daily., Disp: 90 tablet, Rfl: 1   B-D UF III MINI PEN NEEDLES 31G X 5 MM MISC, USE WITH INSULIN PEN INJECTIONS TWICE DAILY, Disp: , Rfl:    clopidogrel (PLAVIX) 75 MG tablet, Take 1 tablet (75 mg total) by mouth daily., Disp: 90 tablet, Rfl: 3   Dupilumab (DUPIXENT) 300 MG/2ML SOPN, Inject 300 mg into the skin every 14 (fourteen) days. , Disp: , Rfl:    esomeprazole (NEXIUM) 40 MG capsule, Take 1 capsule (40 mg total) by mouth 2 (two) times daily., Disp: 90 capsule, Rfl: 0   ferrous sulfate 325 (65 FE) MG tablet, Take 325 mg by mouth daily., Disp: , Rfl:    [START ON 12/03/2020] HYDROcodone-acetaminophen (NORCO) 7.5-325 MG tablet, Take 1 tablet by mouth every 6 (six) hours as needed for moderate pain or severe pain., Disp: 120 tablet, Rfl: 0   [START ON 01/02/2021] HYDROcodone-acetaminophen (NORCO) 7.5-325 MG tablet, Take 1 tablet by mouth every 6 (six) hours as needed for moderate pain or severe pain., Disp: 120 tablet, Rfl: 0   isosorbide mononitrate (IMDUR) 30 MG 24 hr  tablet, Take 1 tablet by mouth twice daily, Disp: 180 tablet, Rfl: 0   LANTUS SOLOSTAR 100 UNIT/ML Solostar Pen, Inject 30-40 Units into the skin See admin instructions. Inject 30 units subcutaneously in the morning at 40 units subcutaneously at bedtime., Disp: , Rfl:    levETIRAcetam (KEPPRA) 750 MG tablet, Take 750 mg by mouth 2 (two) times daily., Disp: , Rfl:    losartan (COZAAR) 100 MG tablet, Take 1 tablet (100 mg total) by mouth daily., Disp: 30 tablet, Rfl: 11   meloxicam (MOBIC) 7.5 MG tablet, Take 7.5 mg by mouth daily  as needed for pain. , Disp: , Rfl:    montelukast (SINGULAIR) 10 MG tablet, Take 10 mg by mouth daily. , Disp: , Rfl:    nitroGLYCERIN (NITROSTAT) 0.4 MG SL tablet, DISSOLVE ONE TABLET UNDER THE TONGUE EVERY 5 MINUTES AS NEEDED FOR CHEST PAIN.  DO NOT EXCEED A TOTAL OF 3 DOSES IN 15 MINUTES, Disp: 25 tablet, Rfl: 0   NOVOLOG FLEXPEN 100 UNIT/ML FlexPen, Inject 10 Units into the skin 3 (three) times daily with meals., Disp: , Rfl:    ondansetron (ZOFRAN) 4 MG tablet, Take 4 mg by mouth as needed., Disp: , Rfl:    potassium chloride SA (KLOR-CON) 20 MEQ tablet, Take 2 tablets (40 mEq total) by mouth 2 (two) times daily. Take extra 2 tablets (40 meq) if you take metolazone, Disp: 140 tablet, Rfl: 5   sertraline (ZOLOFT) 100 MG tablet, Take 100 mg by mouth at bedtime., Disp: , Rfl:    tiotropium (SPIRIVA) 18 MCG inhalation capsule, Place 18 mcg into inhaler and inhale daily., Disp: , Rfl:    torsemide (DEMADEX) 20 MG tablet, Take 1 tablet (20 mg) by mouth twice daily, Disp: , Rfl:    traZODone (DESYREL) 50 MG tablet, Take 50 mg by mouth at bedtime., Disp: , Rfl:    Assessment and Plan: 1. Bilateral sciatica   2. Lumbar spondylosis with myelopathy   3. Spinal stenosis of lumbar region with neurogenic claudication   4. DDD (degenerative disc disease), lumbar   5. Chronic, continuous use of opioids   6. Chronic pain syndrome   7. Facet arthritis of lumbosacral region   8. Morbid obesity ([place])   Based on our discussion today and upon review of the [facility] practitioner database information is appropriate to refill her medicines for August 8 and September 7.  No changes in medication frequency or strength will be made.  She is contacting the pain management center for reestablishing an epidural sometime this next week.  She was unable to do it tomorrow.  Otherwise continue with stretching strengthening exercises and follow-up with her primary care physicians as mentioned.  Return to clinic as  noted.  Follow Up Instructions:    I discussed the assessment and treatment plan with the patient. The patient was provided an opportunity to ask questions and all were answered. The patient agreed with the plan and demonstrated an understanding of the instructions.   The patient was advised to call back or seek an in-person evaluation if the symptoms worsen or if the condition fails to improve as anticipated.  I provided 30 minutes of non-face-to-face time during this encounter.   Molli Barrows, MD

## 2020-12-12 ENCOUNTER — Ambulatory Visit: Payer: 59 | Admitting: Anesthesiology

## 2020-12-24 NOTE — Progress Notes (Signed)
Date:  12/25/2020   ID:  Tina Hayes, Tina Hayes 1946/02/19, MRN [id number]  Patient Location:  [address] [place] [address]   Provider location:   [facility], [place] office  PCP:  Herminio Commons, MD  Cardiologist:  Arvid Right [facility]  Chief Complaint  Patient presents with   3 month follow up     Patient c/o chest tightness, shortness of breath and LE edema for two weeks. Medications reviewed by the patient verbally.      History of Present Illness:    Tina Hayes is a 75 y.o. 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, 24 to 75 yo  Chronic back pain,   HTN  seizure disorder  hospital admission 06/17/2016 for cough, chest pain, shortness of breath,  stress test showing no ischemia PAD: Ulceration right lower extremity, who presents  for follow-up of her chest pain, chronic diastolic CHF  Last seen by myself in clinic September 2021 Seen by one of our providers May 2022 On that visit memory poor, off balance, tired, no energy, poor sleep, GERD symptoms Was taking torsemide 20 twice a day, eating more,reds vest 34  March 2022 Lexiscan stress test that was low risk and normal with EF 55-60%  Followed by pulmonary  Echocardiogram April 2022 ejection fraction 55 to 60% Mild to moderate TR  In follow-up today she reports new issues: Food hanging up, spitting up Had esophagus stretched 3 years ago 04/2017 mild Schatzki ring (acquired) was found at the gastroesophageal junction. A guidewire was placed and the scope was withdrawn. Dilation was performed with a Savary dilator with mild resistance at 16 mm. GEJ about 35cm. A medium-sized hiatal hernia was present.  On PPI, does not remember the name  Was unable to get Nexium covered by insurance, primary care prescribed a different one  Presents  today in a wheelchair but reports that her knees are fine after knee replacement surgery, able to go shopping just fine  EKG personally reviewed by myself on todays visit Normal sinus rhythm rate 49 nonspecific ST-T wave abnormality 1 and aVL, anterolateral leads  Other studies reviewed on today's visit Underwent PV study March 2021 with Dr. Lucky Cowboy    Percutaneous transluminal angioplasty of right anterior tibial artery with 3 mm diameter angioplasty balloon      Percutaneous transluminal angioplasty of the right popliteal artery with 5 mm diameter Lutonix drug-coated angioplasty balloon     Viabahn stent placement to the right popliteal artery with 6 mm diameter   Echocardiogram October 2020 ejection fraction 60% Right heart pressures 47 moderately elevated moderate MR   hospital October 2018 for chest pain shortness of breath Felt to be atypical in nature, GI related, piece of meat stuck in her throat with pain radiating to both shoulders, .  Vomiting, unable to get any food or water down  Eventually piece of meat removed and she was able to swallow  history of GERD, she had ran out of her PPI   Prior CV studies:   The following studies were reviewed today:  Current Outpatient Medications on File Prior to Visit  Medication Sig Dispense Refill   albuterol (PROVENTIL HFA;VENTOLIN HFA) 108 (90 Base) MCG/ACT inhaler Inhale 2 puffs into the lungs every 6 (six) hours as needed  for wheezing or shortness of breath. 1 Inhaler 2   albuterol (PROVENTIL) (2.5 MG/3ML) 0.083% nebulizer solution Take 2.5 mg every 4 (four) hours as needed by nebulization for wheezing or shortness of breath.     amLODipine (NORVASC) 5 MG tablet Take 1 tablet (5 mg total) by mouth daily. 30 tablet 6   atorvastatin (LIPITOR) 40 MG tablet Take 1 tablet (40 mg total) by mouth daily. 90 tablet 1   B-D UF III MINI PEN NEEDLES 31G X 5 MM MISC USE WITH INSULIN PEN INJECTIONS TWICE DAILY     carvedilol (COREG) 3.125 MG tablet  Take 3.125 mg by mouth daily as needed.     clopidogrel (PLAVIX) 75 MG tablet Take 1 tablet (75 mg total) by mouth daily. 90 tablet 3   Dupilumab (DUPIXENT) 300 MG/2ML SOPN Inject 300 mg into the skin every 14 (fourteen) days.      ferrous sulfate 325 (65 FE) MG tablet Take 325 mg by mouth daily.     HYDROcodone-acetaminophen (NORCO) 7.5-325 MG tablet Take 1 tablet by mouth every 6 (six) hours as needed for moderate pain or severe pain. 120 tablet 0   [START ON 01/02/2021] HYDROcodone-acetaminophen (NORCO) 7.5-325 MG tablet Take 1 tablet by mouth every 6 (six) hours as needed for moderate pain or severe pain. 120 tablet 0   Ipratropium-Albuterol (COMBIVENT) 20-100 MCG/ACT AERS respimat Inhale into the lungs.     isosorbide mononitrate (IMDUR) 30 MG 24 hr tablet Take 1 tablet by mouth twice daily 180 tablet 0   KEPPRA 1000 MG tablet Take 1,000 mg by mouth 2 (two) times daily.     LANTUS SOLOSTAR 100 UNIT/ML Solostar Pen Inject 30-40 Units into the skin See admin instructions. Inject 30 units subcutaneously in the morning at 40 units subcutaneously at bedtime.     levETIRAcetam (KEPPRA) 750 MG tablet Take 750 mg by mouth 2 (two) times daily.     losartan (COZAAR) 100 MG tablet Take 1 tablet (100 mg total) by mouth daily. 30 tablet 11   montelukast (SINGULAIR) 10 MG tablet Take 10 mg by mouth daily.      nitroGLYCERIN (NITROSTAT) 0.4 MG SL tablet DISSOLVE ONE TABLET UNDER THE TONGUE EVERY 5 MINUTES AS NEEDED FOR CHEST PAIN.  DO NOT EXCEED A TOTAL OF 3 DOSES IN 15 MINUTES 25 tablet 0   NOVOLOG FLEXPEN 100 UNIT/ML FlexPen Inject 10 Units into the skin 3 (three) times daily with meals.     ondansetron (ZOFRAN) 4 MG tablet Take 4 mg by mouth as needed.     potassium chloride SA (KLOR-CON) 20 MEQ tablet Take 2 tablets (40 mEq total) by mouth 2 (two) times daily. Take extra 2 tablets (40 meq) if you take metolazone 140 tablet 5   sertraline (ZOLOFT) 100 MG tablet Take 100 mg by mouth at bedtime.      tiotropium (SPIRIVA) 18 MCG inhalation capsule Place 18 mcg into inhaler and inhale daily.     torsemide (DEMADEX) 20 MG tablet Take 1 tablet (20 mg) by mouth twice daily     traZODone (DESYREL) 50 MG tablet Take 50 mg by mouth at bedtime.     meloxicam (MOBIC) 7.5 MG tablet Take 7.5 mg by mouth daily as needed for pain.  (Patient not taking: Reported on 12/25/2020)     No current facility-administered medications on file prior to visit.     Past Medical History:  Diagnosis Date   (HFpEF) heart failure with preserved ejection fraction ([place])  a. 05/2016 Echo: EF 60-65%, mild to mod LVH, Gr1 DD, mild MR, mildly dil LA, mod TR, mildly to mod increased PASP.   Acute diastolic heart failure (HCC) 01/27/2017   Anxiety    Arthritis    Chest pain 06/16/2016   CHF (congestive heart failure) (HCC)    Chronic back pain    Chronic diastolic congestive heart failure ([place]) 02/13/2017   COPD (chronic obstructive pulmonary disease) ([place])    Coronary artery disease    a. s/p remote PCI x 5;  b. 2006 s/p CABG x 3 ([place], [place]); b. 05/2016 MV: attenuation corrected images w/o ischemia or wma-->Med rx.   Coronary artery disease of native artery of native heart with stable angina pectoris ([place]) 06/17/2016   Depression    Diabetes mellitus without complication ([place])    Essential hypertension 06/30/2016   GERD (gastroesophageal reflux disease)    Heart attack ([place])    Total of 3 per pt.   Hypertension    Hypertensive urgency 06/03/2015   Seizure ([place])    Seizures ([place])    Past Surgical History:  Procedure Laterality Date   ABDOMINAL HYSTERECTOMY     ANKLE SURGERY Right    CATARACT EXTRACTION W/ INTRAOCULAR LENS  IMPLANT, BILATERAL     COLONOSCOPY WITH PROPOFOL N/A 05/04/2017   Procedure: COLONOSCOPY WITH PROPOFOL;  Surgeon: [name], MD;  Location: [facility] ENDOSCOPY;  Service: Endoscopy;  Laterality: N/A;   CORONARY ANGIOPLASTY     CORONARY ARTERY BYPASS GRAFT     TRIPLE  BYPASS   ESOPHAGOGASTRODUODENOSCOPY (EGD) WITH PROPOFOL N/A 05/04/2017   Procedure: ESOPHAGOGASTRODUODENOSCOPY (EGD) WITH PROPOFOL;  Surgeon: [name], MD;  Location: [facility] ENDOSCOPY;  Service: Endoscopy;  Laterality: N/A;   EXCISION MASS LOWER EXTREMETIES Right 03/02/2018   Procedure: EXCISION SOFT TISSUE MASS FROM MEDIAL ASPECT OF RIGHT ANKLE;  Surgeon: [name], MD;  Location: [facility] ORS;  Service: Orthopedics;  Laterality: Right;   FRACTURE SURGERY     JOINT REPLACEMENT     right   KNEE SURGERY Right    LOWER EXTREMITY ANGIOGRAPHY Right 07/14/2019   Procedure: LOWER EXTREMITY ANGIOGRAPHY;  Surgeon: Algernon Huxley, MD;  Location: [facility] CV LAB;  Service: Cardiovascular;  Laterality: Right;   MOUTH SURGERY Left 07/14/2017   TOTAL KNEE ARTHROPLASTY Left 09/27/2019   Procedure: TOTAL KNEE ARTHROPLASTY;  Surgeon: [name], MD;  Location: [facility] ORS;  Service: Orthopedics;  Laterality: Left;     Current Meds  Medication Sig   albuterol (PROVENTIL HFA;VENTOLIN HFA) 108 (90 Base) MCG/ACT inhaler Inhale 2 puffs into the lungs every 6 (six) hours as needed for wheezing or shortness of breath.   albuterol (PROVENTIL) (2.5 MG/3ML) 0.083% nebulizer solution Take 2.5 mg every 4 (four) hours as needed by nebulization for wheezing or shortness of breath.   amLODipine (NORVASC) 5 MG tablet Take 1 tablet (5 mg total) by mouth daily.   atorvastatin (LIPITOR) 40 MG tablet Take 1 tablet (40 mg total) by mouth daily.   B-D UF III MINI PEN NEEDLES 31G X 5 MM MISC USE WITH INSULIN PEN INJECTIONS TWICE DAILY   carvedilol (COREG) 3.125 MG tablet Take 3.125 mg by mouth daily as needed.   clopidogrel (PLAVIX) 75 MG tablet Take 1 tablet (75 mg total) by mouth daily.   Dupilumab (DUPIXENT) 300 MG/2ML SOPN Inject 300 mg into the skin every 14 (fourteen) days.    ferrous sulfate 325 (65 FE) MG tablet Take 325 mg  by mouth daily.   HYDROcodone-acetaminophen (NORCO) 7.5-325 MG tablet Take 1 tablet by mouth  every 6 (six) hours as needed for moderate pain or severe pain.   [START ON 01/02/2021] HYDROcodone-acetaminophen (NORCO) 7.5-325 MG tablet Take 1 tablet by mouth every 6 (six) hours as needed for moderate pain or severe pain.   Ipratropium-Albuterol (COMBIVENT) 20-100 MCG/ACT AERS respimat Inhale into the lungs.   isosorbide mononitrate (IMDUR) 30 MG 24 hr tablet Take 1 tablet by mouth twice daily   KEPPRA 1000 MG tablet Take 1,000 mg by mouth 2 (two) times daily.   LANTUS SOLOSTAR 100 UNIT/ML Solostar Pen Inject 30-40 Units into the skin See admin instructions. Inject 30 units subcutaneously in the morning at 40 units subcutaneously at bedtime.   levETIRAcetam (KEPPRA) 750 MG tablet Take 750 mg by mouth 2 (two) times daily.   losartan (COZAAR) 100 MG tablet Take 1 tablet (100 mg total) by mouth daily.   montelukast (SINGULAIR) 10 MG tablet Take 10 mg by mouth daily.    nitroGLYCERIN (NITROSTAT) 0.4 MG SL tablet DISSOLVE ONE TABLET UNDER THE TONGUE EVERY 5 MINUTES AS NEEDED FOR CHEST PAIN.  DO NOT EXCEED A TOTAL OF 3 DOSES IN 15 MINUTES   NOVOLOG FLEXPEN 100 UNIT/ML FlexPen Inject 10 Units into the skin 3 (three) times daily with meals.   ondansetron (ZOFRAN) 4 MG tablet Take 4 mg by mouth as needed.   potassium chloride SA (KLOR-CON) 20 MEQ tablet Take 2 tablets (40 mEq total) by mouth 2 (two) times daily. Take extra 2 tablets (40 meq) if you take metolazone   sertraline (ZOLOFT) 100 MG tablet Take 100 mg by mouth at bedtime.   tiotropium (SPIRIVA) 18 MCG inhalation capsule Place 18 mcg into inhaler and inhale daily.   torsemide (DEMADEX) 20 MG tablet Take 1 tablet (20 mg) by mouth twice daily   traZODone (DESYREL) 50 MG tablet Take 50 mg by mouth at bedtime.   [DISCONTINUED] esomeprazole (NEXIUM) 40 MG capsule Take 1 capsule (40 mg total) by mouth 2 (two) times daily.     Allergies:   Aspirin   Social History   Tobacco Use   Smoking status: Former    Packs/day: 0.25    Years: 20.00     Pack years: 5.00    Types: Cigarettes    Quit date: 07/01/2006    Years since quitting: 14.4   Smokeless tobacco: Never  Vaping Use   Vaping Use: Never used  Substance Use Topics   Alcohol use: No    Alcohol/week: 0.0 standard drinks   Drug use: No     Family Hx: The patient's family history includes Cancer in her sister; Depression in her mother; Diabetes in her brother, mother, and another family member; Heart attack in her mother and sister; Heart disease in her mother; Heart failure in her mother and sister; Hypertension in her brother, mother, sister, and another family member; SIDS in her sister; Stroke in her mother.  ROS:   Please see the history of present illness.    Review of Systems  Constitutional: Negative.   Respiratory: Negative.    Cardiovascular: Negative.   Gastrointestinal: Negative.   Musculoskeletal:  Positive for joint pain.  Neurological: Negative.   Psychiatric/Behavioral: Negative.    All other systems reviewed and are negative.   Labs/Other Tests and Data Reviewed:    Recent Labs: 07/02/2020: ALT 14 07/13/2020: TSH 2.640 09/19/2020: BUN 17; Creatinine, Ser 0.85; Hemoglobin 9.9; Platelets 272; Potassium 3.9; Sodium 142  Recent Lipid Panel Lab Results  Component Value Date/Time   CHOL 120 01/30/2017 04:04 AM   TRIG 73 01/30/2017 04:04 AM   HDL 47 01/30/2017 04:04 AM   CHOLHDL 2.6 01/30/2017 04:04 AM   LDLCALC 58 01/30/2017 04:04 AM    Wt Readings from Last 3 Encounters:  12/25/20 265 lb (120.2 kg)  09/19/20 275 lb (124.7 kg)  09/19/20 272 lb (123.4 kg)     Exam:    Vital Signs: Vital signs may also be detailed in the HPI BP 140/80 (BP Location: Left Arm, Patient Position: Sitting, Cuff Size: Large)   Pulse (!) 49   Ht '5\' 11"'$  (1.803 m)   Wt 265 lb (120.2 kg)   SpO2 98%   BMI 36.96 kg/m    Constitutional:  oriented to person, place, and time. No distress.  Obese, in a wheelchair HENT:  Head: Grossly normal Eyes:  no discharge. No  scleral icterus.  Neck: No JVD, no carotid bruits  Cardiovascular: Regular rate and rhythm, no murmurs appreciated Pulmonary/Chest: Clear to auscultation bilaterally, no wheezes or rails Abdominal: Soft.  no distension.  no tenderness.  Musculoskeletal: Normal range of motion Neurological:  normal muscle tone. Coordination normal. No atrophy Skin: Skin warm and dry Psychiatric: normal affect, pleasant   ASSESSMENT & PLAN:    Chronic diastolic congestive heart failure (HCC) Euvolemic on today's visit, no changes to her medications  Type 2 diabetes mellitus without complication, with long-term current use of insulin (HCC) Weight trending upwards We have encouraged continued exercise, careful diet management in an effort to lose weight.  Atypical chest discomfort Further discussion with her, moderate size hiatal hernia noted 2019 with a ring requiring dilation Has not had further GI work-up since then but reports her symptoms are back, food hanging up, sticking, spitting up, unable to eat pieces of meat -CT attenuation correction images from stress test shows at least moderate size hiatal hernia if not bigger -At her request referral placed to GI, may need EGD, unclear if she needs dilation for recurrent ring, we did discuss options for hiatal hernia but will defer to GI She is interested in having this corrected given severity of her symptoms  Coronary artery disease of native artery of native heart with stable angina pectoris ([place]) Currently with no symptoms of angina. No further workup at this time. Continue current medication regimen. Recent stress test low risk Non-smoker, cholesterol at goal  Essential hypertension Blood pressure is well controlled on today's visit. No changes made to the medications.  Morbid obesity ([place]) We have encouraged continued exercise, careful diet management in an effort to lose weight.  Centrilobular emphysema ([place]) Prior smoking, stopped years  ago On inhalers per primary care   Total encounter time more than 25 minutes  Greater than 50% was spent in counseling and coordination of care with the patient    Signed, Ida Rogue, MD  12/25/2020 8:59 AM    [place] Office [address], [place], [place] [address]

## 2020-12-25 ENCOUNTER — Other Ambulatory Visit: Payer: Self-pay

## 2020-12-25 ENCOUNTER — Encounter: Payer: Self-pay | Admitting: Cardiovascular Disease

## 2020-12-25 ENCOUNTER — Ambulatory Visit (INDEPENDENT_AMBULATORY_CARE_PROVIDER_SITE_OTHER): Payer: Medicare HMO | Admitting: Cardiovascular Disease

## 2020-12-25 VITALS — BP 140/80 | HR 49 | Ht 71.0 in | Wt 265.0 lb

## 2020-12-25 DIAGNOSIS — Z951 Presence of aortocoronary bypass graft: Secondary | ICD-10-CM | POA: Diagnosis not present

## 2020-12-25 DIAGNOSIS — I340 Nonrheumatic mitral (valve) insufficiency: Secondary | ICD-10-CM | POA: Diagnosis not present

## 2020-12-25 DIAGNOSIS — K222 Esophageal obstruction: Secondary | ICD-10-CM

## 2020-12-25 DIAGNOSIS — I5032 Chronic diastolic (congestive) heart failure: Secondary | ICD-10-CM | POA: Diagnosis not present

## 2020-12-25 DIAGNOSIS — I25119 Atherosclerotic heart disease of native coronary artery with unspecified angina pectoris: Secondary | ICD-10-CM | POA: Diagnosis not present

## 2020-12-25 DIAGNOSIS — E119 Type 2 diabetes mellitus without complications: Secondary | ICD-10-CM

## 2020-12-25 DIAGNOSIS — K2289 Other specified disease of esophagus: Secondary | ICD-10-CM

## 2020-12-25 DIAGNOSIS — K219 Gastro-esophageal reflux disease without esophagitis: Secondary | ICD-10-CM

## 2020-12-25 DIAGNOSIS — Z794 Long term (current) use of insulin: Secondary | ICD-10-CM

## 2020-12-25 DIAGNOSIS — K449 Diaphragmatic hernia without obstruction or gangrene: Secondary | ICD-10-CM

## 2020-12-25 DIAGNOSIS — R1310 Dysphagia, unspecified: Secondary | ICD-10-CM

## 2020-12-25 MED ORDER — LOSARTAN POTASSIUM 100 MG PO TABS: 100.0000 mg | ORAL_TABLET | Freq: Every day | ORAL | 3 refills | Status: DC

## 2020-12-25 MED ORDER — ISOSORBIDE MONONITRATE ER 30 MG PO TB24: 30.0000 mg | ORAL_TABLET | Freq: Two times a day (BID) | ORAL | 3 refills | Status: DC

## 2020-12-25 MED ORDER — ATORVASTATIN CALCIUM 40 MG PO TABS: 40.0000 mg | ORAL_TABLET | Freq: Every day | ORAL | 1 refills | Status: DC

## 2020-12-25 MED ORDER — CARVEDILOL 3.125 MG PO TABS: 3.1250 mg | ORAL_TABLET | Freq: Every day | ORAL | 3 refills | Status: DC | PRN

## 2020-12-25 NOTE — Patient Instructions (Addendum)
We have placed a referral to gastroenterologist to Dr. Allen Norris, you should be getting a call from their office in a few weeks to set your first appointment.   Medication Instructions:  No changes  If you need a refill on your cardiac medications before your next appointment, please call your pharmacy.   Lab work: No new labs needed  Testing/Procedures: No new testing needed   Follow-Up: At [facility], you and your health needs are our priority.  As part of our continuing mission to provide you with exceptional heart care, we have created designated Provider Care Teams.  These Care Teams include your primary Cardiologist (physician) and Advanced Practice Providers (APPs -  Physician Assistants and Nurse Practitioners) who all work together to provide you with the care you need, when you need it.  You will need a follow up appointment in 6 months  Providers on your designated Care Team:   Murray Hodgkins, NP Christell Faith, PA-C Marrianne Mood, PA-C Cadence [place], [place]  COVID-19 Vaccine Information can be found at: ShippingScam.co.uk For questions related to vaccine distribution or appointments, please email [email or web address] or call 9372095016.

## 2020-12-26 ENCOUNTER — Telehealth: Payer: Self-pay | Admitting: Cardiovascular Disease

## 2020-12-26 NOTE — Telephone Encounter (Signed)
Patient calling to update office on medication she is taking for acid reflux Pantoprazole sodium 40 MG

## 2020-12-26 NOTE — Telephone Encounter (Signed)
Noted Pantoprazole sodium 40 MG added to pt's current medication list

## 2021-01-10 ENCOUNTER — Ambulatory Visit: Payer: Medicare HMO | Admitting: Anesthesiology

## 2021-01-14 ENCOUNTER — Ambulatory Visit (HOSPITAL_BASED_OUTPATIENT_CLINIC_OR_DEPARTMENT_OTHER): Payer: Medicare HMO | Admitting: Anesthesiology

## 2021-01-14 ENCOUNTER — Other Ambulatory Visit: Payer: Self-pay

## 2021-01-14 ENCOUNTER — Ambulatory Visit
Admission: RE | Admit: 2021-01-14 | Discharge: 2021-01-14 | Disposition: A | Discharge status: Home / Self Care | Payer: Medicare HMO | Source: Ambulatory Visit | Attending: Anesthesiology | Admitting: Anesthesiology

## 2021-01-14 ENCOUNTER — Other Ambulatory Visit: Payer: Self-pay | Admitting: Anesthesiology

## 2021-01-14 ENCOUNTER — Encounter: Payer: Self-pay | Admitting: Anesthesiology

## 2021-01-14 VITALS — BP 115/72 | HR 52 | Temp 97.0°F | Resp 16 | Ht 71.0 in | Wt 260.0 lb

## 2021-01-14 DIAGNOSIS — M4716 Other spondylosis with myelopathy, lumbar region: Secondary | ICD-10-CM | POA: Insufficient documentation

## 2021-01-14 DIAGNOSIS — M5432 Sciatica, left side: Secondary | ICD-10-CM | POA: Diagnosis present

## 2021-01-14 DIAGNOSIS — M5136 Other intervertebral disc degeneration, lumbar region: Secondary | ICD-10-CM | POA: Insufficient documentation

## 2021-01-14 DIAGNOSIS — G894 Chronic pain syndrome: Secondary | ICD-10-CM | POA: Diagnosis present

## 2021-01-14 DIAGNOSIS — R52 Pain, unspecified: Secondary | ICD-10-CM | POA: Diagnosis present

## 2021-01-14 DIAGNOSIS — M47817 Spondylosis without myelopathy or radiculopathy, lumbosacral region: Secondary | ICD-10-CM | POA: Insufficient documentation

## 2021-01-14 DIAGNOSIS — F1190 Opioid use, unspecified, uncomplicated: Secondary | ICD-10-CM | POA: Diagnosis present

## 2021-01-14 DIAGNOSIS — M5431 Sciatica, right side: Secondary | ICD-10-CM | POA: Diagnosis present

## 2021-01-14 MED ORDER — MIDAZOLAM HCL 2 MG/2ML IJ SOLN
5.0000 mg | Freq: Once | INTRAMUSCULAR | Status: AC
  Administered 2021-01-14: 1 mg via INTRAVENOUS

## 2021-01-14 MED ORDER — IOHEXOL 180 MG/ML  SOLN
10.0000 mL | Freq: Once | INTRAMUSCULAR | Status: AC | PRN
  Administered 2021-01-14: 5 mL via EPIDURAL

## 2021-01-14 MED ORDER — LACTATED RINGERS IV SOLN
1000.0000 mL | INTRAVENOUS | Status: DC
  Administered 2021-01-14: 1000 mL via INTRAVENOUS

## 2021-01-14 MED ORDER — METHOCARBAMOL 750 MG PO TABS: 750.0000 mg | ORAL_TABLET | Freq: Three times a day (TID) | ORAL | 2 refills | Status: DC | PRN

## 2021-01-14 MED ORDER — ROPIVACAINE HCL 2 MG/ML IJ SOLN
INTRAMUSCULAR | Status: AC
  Filled 2021-01-14: qty 20

## 2021-01-14 MED ORDER — ROPIVACAINE HCL 2 MG/ML IJ SOLN
10.0000 mL | Freq: Once | INTRAMUSCULAR | Status: AC
  Administered 2021-01-14: 10 mL via EPIDURAL

## 2021-01-14 MED ORDER — HYDROCODONE-ACETAMINOPHEN 7.5-325 MG PO TABS: 1.0000 | ORAL_TABLET | Freq: Four times a day (QID) | ORAL | 0 refills | Status: AC | PRN

## 2021-01-14 MED ORDER — TRIAMCINOLONE ACETONIDE 40 MG/ML IJ SUSP
INTRAMUSCULAR | Status: AC
  Filled 2021-01-14: qty 1

## 2021-01-14 MED ORDER — LIDOCAINE HCL (PF) 1 % IJ SOLN
INTRAMUSCULAR | Status: AC
  Filled 2021-01-14: qty 10

## 2021-01-14 MED ORDER — HYDROCODONE-ACETAMINOPHEN 7.5-325 MG PO TABS: 1.0000 | ORAL_TABLET | Freq: Four times a day (QID) | ORAL | 0 refills | Status: DC | PRN

## 2021-01-14 MED ORDER — SODIUM CHLORIDE (PF) 0.9 % IJ SOLN
INTRAMUSCULAR | Status: AC
  Filled 2021-01-14: qty 10

## 2021-01-14 MED ORDER — SODIUM CHLORIDE 0.9% FLUSH
10.0000 mL | Freq: Once | INTRAVENOUS | Status: AC
  Administered 2021-01-14: 10 mL

## 2021-01-14 MED ORDER — TRIAMCINOLONE ACETONIDE 40 MG/ML IJ SUSP
40.0000 mg | Freq: Once | INTRAMUSCULAR | Status: AC
  Administered 2021-01-14: 40 mg

## 2021-01-14 MED ORDER — MIDAZOLAM HCL 5 MG/5ML IJ SOLN
INTRAMUSCULAR | Status: AC
  Filled 2021-01-14: qty 5

## 2021-01-14 MED ORDER — LIDOCAINE HCL (PF) 1 % IJ SOLN
5.0000 mL | Freq: Once | INTRAMUSCULAR | Status: AC
  Administered 2021-01-14: 5 mL via SUBCUTANEOUS

## 2021-01-14 NOTE — Patient Instructions (Signed)

## 2021-01-14 NOTE — Progress Notes (Signed)
Subjective:  Patient ID: Tina Hayes, female    DOB: June 20, 1945  Age: 75 y.o. MRN: [id number]  CC: Back Pain (lower)   Procedure: L5-S1 epidural steroid under fluoroscopic guidance with minimal sedation  HPI KINSLEI RAETHER presents for reevaluation.  She was last seen 3 months ago and continues to have severe right greater than left lower leg pain with spasming of the calf and down the posterior aspect of the leg.  She has had previous epidural injections that have given her good relief.  Her last injection was done back in February and gave her 3 to 4 months of almost complete relief of her sciatica symptoms.  She has had problematic chronic low back pain for which she takes her opioids and these continue to give her good relief.  She has failed more conservative therapy.  Otherwise she is in her usual state of health.  No change in lower extremity strength or function of bowel or bladder function is noted at this time.  She continues to derive good functional lifestyle improvement with therapy notes.  She desires to proceed with a repeat epidural today.  She is been off her Plavix for 10 days.  Outpatient Medications Prior to Visit  Medication Sig Dispense Refill   albuterol (PROVENTIL HFA;VENTOLIN HFA) 108 (90 Base) MCG/ACT inhaler Inhale 2 puffs into the lungs every 6 (six) hours as needed for wheezing or shortness of breath. 1 Inhaler 2   albuterol (PROVENTIL) (2.5 MG/3ML) 0.083% nebulizer solution Take 2.5 mg every 4 (four) hours as needed by nebulization for wheezing or shortness of breath.     amLODipine (NORVASC) 5 MG tablet Take 1 tablet (5 mg total) by mouth daily. 30 tablet 6   atorvastatin (LIPITOR) 40 MG tablet Take 1 tablet (40 mg total) by mouth daily. 90 tablet 1   B-D UF III MINI PEN NEEDLES 31G X 5 MM MISC USE WITH INSULIN PEN INJECTIONS TWICE DAILY     carvedilol (COREG) 3.125 MG tablet Take 1 tablet (3.125 mg total) by mouth daily as needed. 90 tablet 3   Dupilumab  (DUPIXENT) 300 MG/2ML SOPN Inject 300 mg into the skin every 14 (fourteen) days.      ferrous sulfate 325 (65 FE) MG tablet Take 325 mg by mouth daily.     Ipratropium-Albuterol (COMBIVENT) 20-100 MCG/ACT AERS respimat Inhale into the lungs.     isosorbide mononitrate (IMDUR) 30 MG 24 hr tablet Take 1 tablet (30 mg total) by mouth 2 (two) times daily. 180 tablet 3   KEPPRA 1000 MG tablet Take 1,000 mg by mouth 2 (two) times daily.     LANTUS SOLOSTAR 100 UNIT/ML Solostar Pen Inject 30-40 Units into the skin See admin instructions. Inject 30 units subcutaneously in the morning at 40 units subcutaneously at bedtime.     levETIRAcetam (KEPPRA) 750 MG tablet Take 750 mg by mouth 2 (two) times daily.     losartan (COZAAR) 100 MG tablet Take 1 tablet (100 mg total) by mouth daily. 90 tablet 3   montelukast (SINGULAIR) 10 MG tablet Take 10 mg by mouth daily.      nitroGLYCERIN (NITROSTAT) 0.4 MG SL tablet DISSOLVE ONE TABLET UNDER THE TONGUE EVERY 5 MINUTES AS NEEDED FOR CHEST PAIN.  DO NOT EXCEED A TOTAL OF 3 DOSES IN 15 MINUTES 25 tablet 0   NOVOLOG FLEXPEN 100 UNIT/ML FlexPen Inject 10 Units into the skin 3 (three) times daily with meals.     ondansetron (ZOFRAN) 4  MG tablet Take 4 mg by mouth as needed.     pantoprazole (PROTONIX) 40 MG tablet Take 40 mg by mouth daily.     potassium chloride SA (KLOR-CON) 20 MEQ tablet Take 2 tablets (40 mEq total) by mouth 2 (two) times daily. Take extra 2 tablets (40 meq) if you take metolazone 140 tablet 5   sertraline (ZOLOFT) 100 MG tablet Take 100 mg by mouth at bedtime.     tiotropium (SPIRIVA) 18 MCG inhalation capsule Place 18 mcg into inhaler and inhale daily.     torsemide (DEMADEX) 20 MG tablet Take 1 tablet (20 mg) by mouth twice daily     traZODone (DESYREL) 50 MG tablet Take 50 mg by mouth at bedtime.     HYDROcodone-acetaminophen (NORCO) 7.5-325 MG tablet Take 1 tablet by mouth every 6 (six) hours as needed for moderate pain or severe pain. 120 tablet  0   clopidogrel (PLAVIX) 75 MG tablet Take 1 tablet (75 mg total) by mouth daily. (Patient not taking: Reported on 01/14/2021) 90 tablet 3   meloxicam (MOBIC) 7.5 MG tablet Take 7.5 mg by mouth daily as needed for pain.  (Patient not taking: No sig reported)     No facility-administered medications prior to visit.    Review of Systems CNS: No confusion or sedation Cardiac: No angina or palpitations GI: No abdominal pain or constipation Constitutional: No nausea vomiting fevers or chills  Objective:  BP 115/72   Pulse (!) 52   Temp (!) 97 F (36.1 C)   Resp 16   Ht '5\' 11"'$  (1.803 m)   Wt 260 lb (117.9 kg)   SpO2 99%   BMI 36.26 kg/m    BP Readings from Last 3 Encounters:  01/14/21 115/72  12/25/20 140/80  10/12/20 (!) 177/72     Wt Readings from Last 3 Encounters:  01/14/21 260 lb (117.9 kg)  12/25/20 265 lb (120.2 kg)  09/19/20 275 lb (124.7 kg)     Physical Exam Pt is alert and oriented PERRL EOMI HEART IS RRR no murmur or rub LCTA no wheezing or rales MUSCULOSKELETAL reveals some paraspinous muscle tenderness but no overt trigger points.  She walks with the assistance of a cane and has difficulty going from seated to standing secondary to her obesity and knee problems.  She has a positive straight leg raise on the right side.  Muscle tone and bulk is at baseline.  Labs  Lab Results  Component Value Date   HGBA1C 6.5 (H) 01/18/2019   HGBA1C 7.5 (H) 05/27/2018   HGBA1C 7.7 (H) 08/16/2017   Lab Results  Component Value Date   LDLCALC 58 01/30/2017   CREATININE 0.85 09/19/2020    -------------------------------------------------------------------------------------------------------------------- Lab Results  Component Value Date   WBC 5.9 09/19/2020   HGB 9.9 (L) 09/19/2020   HCT 33.0 (L) 09/19/2020   PLT 272 09/19/2020   GLUCOSE 56 (L) 09/19/2020   CHOL 120 01/30/2017   TRIG 73 01/30/2017   HDL 47 01/30/2017   LDLCALC 58 01/30/2017   ALT 14  07/02/2020   AST 15 07/02/2020   NA 142 09/19/2020   K 3.9 09/19/2020   CL 109 09/19/2020   CREATININE 0.85 09/19/2020   BUN 17 09/19/2020   CO2 23 09/19/2020   TSH 2.640 07/13/2020   INR 1.0 07/22/2019   HGBA1C 6.5 (H) 01/18/2019    --------------------------------------------------------------------------------------------------------------------- DG PAIN CLINIC C-ARM 1-60 MIN NO REPORT  Result Date: 01/14/2021 Fluoro was used, but no Radiologist interpretation will  be provided. Please refer to "NOTES" tab for provider progress note.    Assessment & Plan:   Yaslin was seen today for back pain.  Diagnoses and all orders for this visit:  Bilateral sciatica  Lumbar spondylosis with myelopathy  DDD (degenerative disc disease), lumbar  Chronic, continuous use of opioids  Chronic pain syndrome  Facet arthritis of lumbosacral region  Morbid obesity ([place])  Other orders -     triamcinolone acetonide (KENALOG-40) injection 40 mg -     sodium chloride flush (NS) 0.9 % injection 10 mL -     ropivacaine (PF) 2 mg/mL (0.2%) (NAROPIN) injection 10 mL -     midazolam (VERSED) injection 5 mg -     lidocaine (PF) (XYLOCAINE) 1 % injection 5 mL -     lactated ringers infusion 1,000 mL -     iohexol (OMNIPAQUE) 180 MG/ML injection 10 mL -     methocarbamol (ROBAXIN-750) 750 MG tablet; Take 1 tablet (750 mg total) by mouth every 8 (eight) hours as needed for muscle spasms. -     HYDROcodone-acetaminophen (NORCO) 7.5-325 MG tablet; Take 1 tablet by mouth every 6 (six) hours as needed for moderate pain or severe pain. -     HYDROcodone-acetaminophen (NORCO) 7.5-325 MG tablet; Take 1 tablet by mouth every 6 (six) hours as needed for moderate pain or severe pain.        ----------------------------------------------------------------------------------------------------------------------  Problem List Items Addressed This Visit       Unprioritized   Morbid obesity ([place])    Other Visit Diagnoses     Bilateral sciatica    -  Primary   Relevant Medications   midazolam (VERSED) injection 5 mg (Completed)   methocarbamol (ROBAXIN-750) 750 MG tablet   Lumbar spondylosis with myelopathy       Relevant Medications   triamcinolone acetonide (KENALOG-40) injection 40 mg (Completed)   methocarbamol (ROBAXIN-750) 750 MG tablet   HYDROcodone-acetaminophen (NORCO) 7.5-325 MG tablet (Start on 02/01/2021)   HYDROcodone-acetaminophen (NORCO) 7.5-325 MG tablet (Start on 03/02/2021)   DDD (degenerative disc disease), lumbar       Relevant Medications   triamcinolone acetonide (KENALOG-40) injection 40 mg (Completed)   methocarbamol (ROBAXIN-750) 750 MG tablet   HYDROcodone-acetaminophen (NORCO) 7.5-325 MG tablet (Start on 02/01/2021)   HYDROcodone-acetaminophen (NORCO) 7.5-325 MG tablet (Start on 03/02/2021)   Chronic, continuous use of opioids       Chronic pain syndrome       Relevant Medications   triamcinolone acetonide (KENALOG-40) injection 40 mg (Completed)   ropivacaine (PF) 2 mg/mL (0.2%) (NAROPIN) injection 10 mL (Completed)   lidocaine (PF) (XYLOCAINE) 1 % injection 5 mL (Completed)   methocarbamol (ROBAXIN-750) 750 MG tablet   HYDROcodone-acetaminophen (NORCO) 7.5-325 MG tablet (Start on 02/01/2021)   HYDROcodone-acetaminophen (NORCO) 7.5-325 MG tablet (Start on 03/02/2021)   Facet arthritis of lumbosacral region       Relevant Medications   triamcinolone acetonide (KENALOG-40) injection 40 mg (Completed)   methocarbamol (ROBAXIN-750) 750 MG tablet   HYDROcodone-acetaminophen (NORCO) 7.5-325 MG tablet (Start on 02/01/2021)   HYDROcodone-acetaminophen ([place]) 7.5-325 MG tablet (Start on 03/02/2021)         ----------------------------------------------------------------------------------------------------------------------  1. Bilateral sciatica We will proceed with a repeat epidural.  We have gone over the risks and benefits of the procedure with her in  full detail and all questions were answered.  I want her to continue efforts at weight loss and secondary to the spasming we will start her on Robaxin 750  mg twice a day.  She can restart her Plavix tomorrow.  We will schedule her for 79-monthreturn to clinic.  Continue efforts at weight loss as mentioned with stretching strengthening exercises as reviewed.  2. Lumbar spondylosis with myelopathy As above  3. DDD (degenerative disc disease), lumbar As above  4. Chronic, continuous use of opioids [facility] information is appropriate for refills dated for the next 2 months.  5. Chronic pain syndrome As above and continue follow-up with her primary care physicians for baseline medical care.  6. Facet arthritis of lumbosacral region   7. Morbid obesity ([place]     ----------------------------------------------------------------------------------------------------------------------  I am having CVelora Heckler Estelle start on methocarbamol and HYDROcodone-acetaminophen. I am also having her maintain her tiotropium, traZODone, albuterol, albuterol, montelukast, nitroGLYCERIN, meloxicam, levETIRAcetam, B-D UF III MINI PEN NEEDLES, clopidogrel, Dupixent, NovoLOG FlexPen, sertraline, Lantus SoloStar, ferrous sulfate, potassium chloride SA, ondansetron, torsemide, amLODipine, Keppra, Ipratropium-Albuterol, atorvastatin, carvedilol, isosorbide mononitrate, losartan, pantoprazole, and HYDROcodone-acetaminophen. We administered triamcinolone acetonide, sodium chloride flush, ropivacaine (PF) 2 mg/mL (0.2%), midazolam, lidocaine (PF), lactated ringers, and iohexol.   Meds ordered this encounter  Medications   triamcinolone acetonide (KENALOG-40) injection 40 mg   sodium chloride flush (NS) 0.9 % injection 10 mL   ropivacaine (PF) 2 mg/mL (0.2%) (NAROPIN) injection 10 mL   midazolam (VERSED) injection 5 mg   lidocaine (PF) (XYLOCAINE) 1 % injection 5 mL    lactated ringers infusion 1,000 mL   iohexol (OMNIPAQUE) 180 MG/ML injection 10 mL   methocarbamol (ROBAXIN-750) 750 MG tablet    Sig: Take 1 tablet (750 mg total) by mouth every 8 (eight) hours as needed for muscle spasms.    Dispense:  90 tablet    Refill:  2   HYDROcodone-acetaminophen (NORCO) 7.5-325 MG tablet    Sig: Take 1 tablet by mouth every 6 (six) hours as needed for moderate pain or severe pain.    Dispense:  120 tablet    Refill:  0   HYDROcodone-acetaminophen (NORCO) 7.5-325 MG tablet    Sig: Take 1 tablet by mouth every 6 (six) hours as needed for moderate pain or severe pain.    Dispense:  120 tablet    Refill:  0   Patient's Medications  New Prescriptions   HYDROCODONE-ACETAMINOPHEN (NORCO) 7.5-325 MG TABLET    Take 1 tablet by mouth every 6 (six) hours as needed for moderate pain or severe pain.   METHOCARBAMOL (ROBAXIN-750) 750 MG TABLET    Take 1 tablet (750 mg total) by mouth every 8 (eight) hours as needed for muscle spasms.  Previous Medications   ALBUTEROL (PROVENTIL HFA;VENTOLIN HFA) 108 (90 BASE) MCG/ACT INHALER    Inhale 2 puffs into the lungs every 6 (six) hours as needed for wheezing or shortness of breath.   ALBUTEROL (PROVENTIL) (2.5 MG/3ML) 0.083% NEBULIZER SOLUTION    Take 2.5 mg every 4 (four) hours as needed by nebulization for wheezing or shortness of breath.   AMLODIPINE (NORVASC) 5 MG TABLET    Take 1 tablet (5 mg total) by mouth daily.   ATORVASTATIN (LIPITOR) 40 MG TABLET    Take 1 tablet (40 mg total) by mouth daily.   B-D UF III MINI PEN NEEDLES 31G X 5 MM MISC    USE WITH INSULIN PEN INJECTIONS TWICE DAILY   CARVEDILOL (COREG) 3.125 MG TABLET    Take 1 tablet (3.125 mg total) by mouth daily as needed.   CLOPIDOGREL (PLAVIX) 75 MG  TABLET    Take 1 tablet (75 mg total) by mouth daily.   DUPILUMAB (DUPIXENT) 300 MG/2ML SOPN    Inject 300 mg into the skin every 14 (fourteen) days.    FERROUS SULFATE 325 (65 FE) MG TABLET    Take 325 mg by mouth  daily.   IPRATROPIUM-ALBUTEROL (COMBIVENT) 20-100 MCG/ACT AERS RESPIMAT    Inhale into the lungs.   ISOSORBIDE MONONITRATE (IMDUR) 30 MG 24 HR TABLET    Take 1 tablet (30 mg total) by mouth 2 (two) times daily.   KEPPRA 1000 MG TABLET    Take 1,000 mg by mouth 2 (two) times daily.   LANTUS SOLOSTAR 100 UNIT/ML SOLOSTAR PEN    Inject 30-40 Units into the skin See admin instructions. Inject 30 units subcutaneously in the morning at 40 units subcutaneously at bedtime.   LEVETIRACETAM (KEPPRA) 750 MG TABLET    Take 750 mg by mouth 2 (two) times daily.   LOSARTAN (COZAAR) 100 MG TABLET    Take 1 tablet (100 mg total) by mouth daily.   MELOXICAM (MOBIC) 7.5 MG TABLET    Take 7.5 mg by mouth daily as needed for pain.    MONTELUKAST (SINGULAIR) 10 MG TABLET    Take 10 mg by mouth daily.    NITROGLYCERIN (NITROSTAT) 0.4 MG SL TABLET    DISSOLVE ONE TABLET UNDER THE TONGUE EVERY 5 MINUTES AS NEEDED FOR CHEST PAIN.  DO NOT EXCEED A TOTAL OF 3 DOSES IN 15 MINUTES   NOVOLOG FLEXPEN 100 UNIT/ML FLEXPEN    Inject 10 Units into the skin 3 (three) times daily with meals.   ONDANSETRON (ZOFRAN) 4 MG TABLET    Take 4 mg by mouth as needed.   PANTOPRAZOLE (PROTONIX) 40 MG TABLET    Take 40 mg by mouth daily.   POTASSIUM CHLORIDE SA (KLOR-CON) 20 MEQ TABLET    Take 2 tablets (40 mEq total) by mouth 2 (two) times daily. Take extra 2 tablets (40 meq) if you take metolazone   SERTRALINE (ZOLOFT) 100 MG TABLET    Take 100 mg by mouth at bedtime.   TIOTROPIUM (SPIRIVA) 18 MCG INHALATION CAPSULE    Place 18 mcg into inhaler and inhale daily.   TORSEMIDE (DEMADEX) 20 MG TABLET    Take 1 tablet (20 mg) by mouth twice daily   TRAZODONE (DESYREL) 50 MG TABLET    Take 50 mg by mouth at bedtime.  Modified Medications   Modified Medication Previous Medication   HYDROCODONE-ACETAMINOPHEN (NORCO) 7.5-325 MG TABLET HYDROcodone-acetaminophen (NORCO) 7.5-325 MG tablet      Take 1 tablet by mouth every 6 (six) hours as needed for  moderate pain or severe pain.    Take 1 tablet by mouth every 6 (six) hours as needed for moderate pain or severe pain.  Discontinued Medications   No medications on file   ----------------------------------------------------------------------------------------------------------------------  Follow-up: Return in about 2 months (around 03/16/2021) for evaluation, med refill.   Procedure: L5-S1 LESI with fluoroscopic guidance and minimal sedation  NOTE: The risks, benefits, and expectations of the procedure have been discussed and explained to the patient who was understanding and in agreement with suggested treatment plan. No guarantees were made.  DESCRIPTION OF PROCEDURE: Lumbar epidural steroid injection with 1 mg IV Versed, EKG, blood pressure, pulse, and pulse oximetry monitoring. The procedure was performed with the patient in the prone position under fluoroscopic guidance.  Sterile prep x3 was initiated and I then injected subcutaneous lidocaine to the overlying  L5-S1 site after its fluoroscopic identifictation.  Using strict aseptic technique, I then advanced an 18-gauge Tuohy epidural needle in the midline using interlaminar approach via loss-of-resistance to saline technique. There was negative aspiration for heme or  CSF.  I then confirmed position with both AP and Lateral fluoroscan.  2 cc of contrast dye were injected and a  total of 5 mL of Preservative-Free normal saline mixed with 40 mg of Kenalog and 1cc Ropicaine 0.2 percent were injected incrementally via the  epidurally placed needle. The needle was removed. The patient tolerated the injection well and was convalesced and discharged to home in stable condition. Should the patient have any post procedure difficulty they have been instructed on how to contact us for assistance.    Molli Barrows, MD

## 2021-01-14 NOTE — Progress Notes (Signed)
Safety precautions to be maintained throughout the outpatient stay will include: orient to surroundings, keep bed in low position, maintain call bell within reach at all times, provide assistance with transfer out of bed and ambulation.  

## 2021-01-15 ENCOUNTER — Telehealth: Payer: Self-pay

## 2021-01-15 NOTE — Telephone Encounter (Signed)
Post procedure phone call.  LM 

## 2021-01-31 ENCOUNTER — Other Ambulatory Visit: Payer: Self-pay | Admitting: Cardiovascular Disease

## 2021-02-01 ENCOUNTER — Other Ambulatory Visit: Payer: Self-pay | Admitting: Cardiovascular Disease

## 2021-02-08 ENCOUNTER — Other Ambulatory Visit: Payer: Self-pay | Admitting: Cardiovascular Disease

## 2021-02-08 NOTE — Telephone Encounter (Signed)
Was able to reach back out to Mr. Hodes to advise her that metoprolol was removed from her medication list back in March of this year as her HR was low. Per Cadence Kathlen Mody PA-Cn OV notes from 07/13/2020  "BB held for bradycardia"  AVS from Laurann Montana, NP advised  Loel Dubonnet, NP   Nurse Practitioner  Specialty:  Cardiology  Patient Instructions  Addendum  Encounter Date:  07/13/2020   Medication Instructions:  Your physician has recommended you make the following change in your medication:    STOP Carvedilol and Metoprolol       Mrs. Welshans reports understanding, "I just wanted to make sure I was not suppose to be taking since it was not on my med list". Advised pt that pharmacy may still have med on auto-refill list. Pt reports has not been taking but just wanted to make sure.  Mrs. Sweezy is thankful for the return call to clarify.

## 2021-02-08 NOTE — Telephone Encounter (Signed)
Patient calling to clarify if she still takes metoprolol medication Does not show on list  Please call to discuss

## 2021-02-11 ENCOUNTER — Emergency Department: Payer: Medicare HMO

## 2021-02-11 ENCOUNTER — Other Ambulatory Visit: Payer: Self-pay

## 2021-02-11 ENCOUNTER — Emergency Department
Admission: EM | Admit: 2021-02-11 | Discharge: 2021-02-11 | Disposition: A | Discharge status: Home / Self Care | Payer: Medicare HMO | Attending: Emergency Medicine | Admitting: Emergency Medicine

## 2021-02-11 DIAGNOSIS — Z951 Presence of aortocoronary bypass graft: Secondary | ICD-10-CM | POA: Diagnosis not present

## 2021-02-11 DIAGNOSIS — Z96652 Presence of left artificial knee joint: Secondary | ICD-10-CM | POA: Insufficient documentation

## 2021-02-11 DIAGNOSIS — I25118 Atherosclerotic heart disease of native coronary artery with other forms of angina pectoris: Secondary | ICD-10-CM | POA: Insufficient documentation

## 2021-02-11 DIAGNOSIS — Z7951 Long term (current) use of inhaled steroids: Secondary | ICD-10-CM | POA: Diagnosis not present

## 2021-02-11 DIAGNOSIS — I5032 Chronic diastolic (congestive) heart failure: Secondary | ICD-10-CM | POA: Diagnosis not present

## 2021-02-11 DIAGNOSIS — Z79899 Other long term (current) drug therapy: Secondary | ICD-10-CM | POA: Diagnosis not present

## 2021-02-11 DIAGNOSIS — E119 Type 2 diabetes mellitus without complications: Secondary | ICD-10-CM | POA: Insufficient documentation

## 2021-02-11 DIAGNOSIS — I110 Hypertensive heart disease with heart failure: Secondary | ICD-10-CM | POA: Diagnosis not present

## 2021-02-11 DIAGNOSIS — J441 Chronic obstructive pulmonary disease with (acute) exacerbation: Secondary | ICD-10-CM | POA: Insufficient documentation

## 2021-02-11 DIAGNOSIS — Z87891 Personal history of nicotine dependence: Secondary | ICD-10-CM | POA: Insufficient documentation

## 2021-02-11 DIAGNOSIS — Z794 Long term (current) use of insulin: Secondary | ICD-10-CM | POA: Insufficient documentation

## 2021-02-11 DIAGNOSIS — R059 Cough, unspecified: Secondary | ICD-10-CM | POA: Diagnosis present

## 2021-02-11 LAB — COMPREHENSIVE METABOLIC PANEL
ALT: 15 U/L (ref 0–44)
AST: 17 U/L (ref 15–41)
Albumin: 3.6 g/dL (ref 3.5–5.0)
Alkaline Phosphatase: 108 U/L (ref 38–126)
Anion gap: 6 (ref 5–15)
BUN: 15 mg/dL (ref 8–23)
CO2: 29 mmol/L (ref 22–32)
Calcium: 9.2 mg/dL (ref 8.9–10.3)
Chloride: 105 mmol/L (ref 98–111)
Creatinine, Ser: 0.83 mg/dL (ref 0.44–1.00)
GFR, Estimated: >60 mL/min (ref >60)
Glucose, Bld: 71 mg/dL (ref 70–99)
Potassium: 3.7 mmol/L (ref 3.5–5.1)
Sodium: 140 mmol/L (ref 135–145)
Total Bilirubin: 0.6 mg/dL (ref 0.3–1.2)
Total Protein: 7.8 g/dL (ref 6.5–8.1)

## 2021-02-11 LAB — CBC WITH DIFFERENTIAL/PLATELET
Abs Immature Granulocytes: 0.03 10*3/uL (ref 0.00–0.07)
Basophils Absolute: 0.1 10*3/uL (ref 0.0–0.1)
Basophils Relative: 1 %
Eosinophils Absolute: 0.4 10*3/uL (ref 0.0–0.5)
Eosinophils Relative: 6 %
HCT: 29.3 % — ABNORMAL LOW (ref 36.0–46.0)
Hemoglobin: 9 g/dL — ABNORMAL LOW (ref 12.0–15.0)
Immature Granulocytes: 1 %
Lymphocytes Relative: 42 %
Lymphs Abs: 2.5 10*3/uL (ref 0.7–4.0)
MCH: 24.6 pg — ABNORMAL LOW (ref 26.0–34.0)
MCHC: 30.7 g/dL (ref 30.0–36.0)
MCV: 80.1 fL (ref 80.0–100.0)
Monocytes Absolute: 0.5 10*3/uL (ref 0.1–1.0)
Monocytes Relative: 8 %
Neutro Abs: 2.6 10*3/uL (ref 1.7–7.7)
Neutrophils Relative %: 42 %
Platelets: 278 10*3/uL (ref 150–400)
RBC: 3.66 MIL/uL — ABNORMAL LOW (ref 3.87–5.11)
RDW: 15.5 % (ref 11.5–15.5)
WBC: 6 10*3/uL (ref 4.0–10.5)
nRBC: 0 % (ref 0.0–0.2)

## 2021-02-11 LAB — TROPONIN I (HIGH SENSITIVITY): Troponin I (High Sensitivity): 7 ng/L (ref <18)

## 2021-02-11 MED ORDER — PREDNISONE 50 MG PO TABS: ORAL_TABLET | ORAL | 0 refills | Status: DC

## 2021-02-11 MED ORDER — PREDNISONE 20 MG PO TABS
60.0000 mg | ORAL_TABLET | Freq: Once | ORAL | Status: AC
  Administered 2021-02-11: 60 mg via ORAL
  Filled 2021-02-11: qty 3

## 2021-02-11 MED ORDER — IPRATROPIUM-ALBUTEROL 0.5-2.5 (3) MG/3ML IN SOLN
3.0000 mL | Freq: Once | RESPIRATORY_TRACT | Status: AC
  Administered 2021-02-11: 3 mL via RESPIRATORY_TRACT
  Filled 2021-02-11: qty 3

## 2021-02-11 NOTE — ED Triage Notes (Signed)
Pt comes via [facility] from home with c/o cough and bronchitis. Pt states she doesn't feel any better. VSS

## 2021-02-11 NOTE — Discharge Instructions (Addendum)
Your shortness of breath is likely from an exacerbation of your COPD from a virus.  Please take the prednisone once per day for the next 5 days.  You can use your albuterol inhaler every 4 hours as needed.  If your breath is worsening, please return to the emergency department.

## 2021-02-11 NOTE — ED Provider Notes (Signed)
[facility]  ____________________________________________   Event Date/Time   First MD Initiated Contact with Patient 02/11/21 1502     (approximate)  I have reviewed the triage vital signs and the nursing notes.   HISTORY  Chief Complaint Bronchilitis    HPI Tina Hayes is a 75 y.o. female with past medical history of HFpEF, COPD, CAD who presents with cough and shortness of breath.  Patient symptoms started about 2 weeks ago.  She endorses cough productive of yellow sputum as well as dyspnea.  She has pain in the center of her chest that is worse with breathing and with cough.  Notes that she has had this chest pain before and was told it was due to her hiatal hernia.  She saw her primary doctor who told her that it was likely bronchitis, she did not take any meds for it.  Has been using her inhalers at home.  Spoke with her doctor today and was told to come to the emergency department because of no improvement.  She denies lower extremity edema.  Tells me she does have a history of DVT but is not anticoagulated.         Past Medical History:  Diagnosis Date   (HFpEF) heart failure with preserved ejection fraction ([place])    a. 05/2016 Echo: EF 60-65%, mild to mod LVH, Gr1 DD, mild MR, mildly dil LA, mod TR, mildly to mod increased PASP.   Acute diastolic heart failure (HCC) 01/27/2017   Anxiety    Arthritis    Chest pain 06/16/2016   CHF (congestive heart failure) (HCC)    Chronic back pain    Chronic diastolic congestive heart failure ([place]) 02/13/2017   COPD (chronic obstructive pulmonary disease) ([place])    Coronary artery disease    a. s/p remote PCI x 5;  b. 2006 s/p CABG x 3 ([place], [place]); b. 05/2016 MV: attenuation corrected images w/o ischemia or wma-->Med rx.   Coronary artery disease of native artery of native heart with stable angina pectoris ([place]) 06/17/2016   Depression    Diabetes mellitus without complication  ([place])    Essential hypertension 06/30/2016   GERD (gastroesophageal reflux disease)    Heart attack ([place])    Total of 3 per pt.   Hypertension    Hypertensive urgency 06/03/2015   Seizure ([place])    Seizures [facility])     Patient Active Problem List   Diagnosis Date Noted   Status post total knee replacement using cement, 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   Atherosclerosis of native arteries of the extremities with ulceration ([place]) 07/05/2019   Onychomycosis 05/30/2019   Gastroesophageal reflux disease with hiatal hernia 12/14/2018   Presbyesophagus 12/14/2018   Occlusion and stenosis of vertebral artery 08/31/2018   AKI (acute kidney injury) ([place]) 05/25/2018   BMI 50.0-59.9, adult ([place]) 04/16/2018   Seizures ([place]) 11/10/2017   Mass of right lower leg 09/11/2017   Dysphagia 04/23/2017   Chronic diastolic congestive heart failure ([place]) 18/29/9371   Acute diastolic heart failure ([place]) 01/27/2017   Depression 12/11/2016   Essential hypertension 06/30/2016   Snoring 06/30/2016   Diabetes mellitus ([place]) 06/30/2016   Coronary artery disease of native artery of native heart with stable angina pectoris ([place]) 06/17/2016   Morbid obesity ([place]) 06/17/2016   COPD (chronic obstructive pulmonary disease) ([place]) 08/28/2015   COPD with acute exacerbation ([place]) 04/14/2015    Past Surgical History:  Procedure Laterality Date   ABDOMINAL HYSTERECTOMY  ANKLE SURGERY Right    CATARACT EXTRACTION W/ INTRAOCULAR LENS  IMPLANT, BILATERAL     COLONOSCOPY WITH PROPOFOL N/A 05/04/2017   Procedure: COLONOSCOPY WITH PROPOFOL;  Surgeon: [name], MD;  Location: [facility] ENDOSCOPY;  Service: Endoscopy;  Laterality: N/A;   CORONARY ANGIOPLASTY     CORONARY ARTERY BYPASS GRAFT     TRIPLE BYPASS   ESOPHAGOGASTRODUODENOSCOPY (EGD) WITH PROPOFOL N/A 05/04/2017   Procedure: ESOPHAGOGASTRODUODENOSCOPY (EGD) WITH PROPOFOL;  Surgeon: [name], MD;  Location: [facility] ENDOSCOPY;  Service: Endoscopy;  Laterality: N/A;   EXCISION  MASS LOWER EXTREMETIES Right 03/02/2018   Procedure: EXCISION SOFT TISSUE MASS FROM MEDIAL ASPECT OF RIGHT ANKLE;  Surgeon: [name], MD;  Location: [facility] ORS;  Service: Orthopedics;  Laterality: Right;   FRACTURE SURGERY     JOINT REPLACEMENT     right   KNEE SURGERY Right    LOWER EXTREMITY ANGIOGRAPHY Right 07/14/2019   Procedure: LOWER EXTREMITY ANGIOGRAPHY;  Surgeon: Algernon Huxley, MD;  Location: [place] CV LAB;  Service: Cardiovascular;  Laterality: Right;   MOUTH SURGERY Left 07/14/2017   TOTAL KNEE ARTHROPLASTY Left 09/27/2019   Procedure: TOTAL KNEE ARTHROPLASTY;  Surgeon: [name], MD;  Location: [facility] ORS;  Service: Orthopedics;  Laterality: Left;    Prior to Admission medications   Medication Sig Start Date End Date Taking? Authorizing Provider  predniSONE (DELTASONE) 50 MG tablet Take 1 pill daily for 5 days 02/11/21  Yes Rada Hay, MD  albuterol (PROVENTIL HFA;VENTOLIN HFA) 108 (90 Base) MCG/ACT inhaler Inhale 2 puffs into the lungs every 6 (six) hours as needed for wheezing or shortness of breath. 08/30/15   Epifanio Lesches, MD  albuterol (PROVENTIL) (2.5 MG/3ML) 0.083% nebulizer solution Take 2.5 mg every 4 (four) hours as needed by nebulization for wheezing or shortness of breath.    [provider]  amLODipine (NORVASC) 5 MG tablet Take 1 tablet (5 mg total) by mouth daily. 08/10/20 01/14/21  Minna Merritts, MD  atorvastatin (LIPITOR) 40 MG tablet Take 1 tablet (40 mg total) by mouth daily. 12/25/20   Minna Merritts, MD  B-D UF III MINI PEN NEEDLES 31G X 5 MM MISC USE WITH INSULIN PEN INJECTIONS TWICE DAILY 08/05/18   [provider]  carvedilol (COREG) 3.125 MG tablet Take 1 tablet (3.125 mg total) by mouth daily as needed. 12/25/20   Minna Merritts, MD  clopidogrel (PLAVIX) 75 MG tablet Take 1 tablet (75 mg total) by mouth daily. Patient not taking: Reported on 01/14/2021 05/19/19   Alisa Graff, FNP  Dupilumab (DUPIXENT) 300  MG/2ML SOPN Inject 300 mg into the skin every 14 (fourteen) days.     [provider]  ferrous sulfate 325 (65 FE) MG tablet Take 325 mg by mouth daily.    [provider]  HYDROcodone-acetaminophen (NORCO) 7.5-325 MG tablet Take 1 tablet by mouth every 6 (six) hours as needed for moderate pain or severe pain. 02/01/21 03/03/21  Molli Barrows, MD  HYDROcodone-acetaminophen (NORCO) 7.5-325 MG tablet Take 1 tablet by mouth every 6 (six) hours as needed for moderate pain or severe pain. 03/02/21 04/01/21  Molli Barrows, MD  Ipratropium-Albuterol (COMBIVENT) 20-100 MCG/ACT AERS respimat Inhale into the lungs. 11/22/20 11/22/21  [provider]  isosorbide mononitrate (IMDUR) 30 MG 24 hr tablet Take 1 tablet (30 mg total) by mouth 2 (two) times daily. 12/25/20   Minna Merritts, MD  KEPPRA 1000 MG tablet Take 1,000 mg  by mouth 2 (two) times daily. 12/17/20   [provider]  LANTUS SOLOSTAR 100 UNIT/ML Solostar Pen Inject 30-40 Units into the skin See admin instructions. Inject 30 units subcutaneously in the morning at 40 units subcutaneously at bedtime. 05/26/19   [provider]  levETIRAcetam (KEPPRA) 750 MG tablet Take 750 mg by mouth 2 (two) times daily.    [provider]  losartan (COZAAR) 100 MG tablet Take 1 tablet (100 mg total) by mouth daily. 12/25/20   Minna Merritts, MD  meloxicam (MOBIC) 7.5 MG tablet Take 7.5 mg by mouth daily as needed for pain.  Patient not taking: No sig reported    [provider]  methocarbamol (ROBAXIN-750) 750 MG tablet Take 1 tablet (750 mg total) by mouth every 8 (eight) hours as needed for muscle spasms. 01/14/21 02/13/21  Molli Barrows, MD  montelukast (SINGULAIR) 10 MG tablet Take 10 mg by mouth daily.  11/05/17   [provider]  nitroGLYCERIN (NITROSTAT) 0.4 MG SL tablet DISSOLVE ONE TABLET UNDER THE TONGUE EVERY 5 MINUTES AS NEEDED FOR CHEST PAIN.  DO NOT EXCEED A TOTAL OF 3 DOSES IN 15  MINUTES 07/09/18   Gollan, Kathlene November, MD  NOVOLOG FLEXPEN 100 UNIT/ML FlexPen Inject 10 Units into the skin 3 (three) times daily with meals. 06/07/19   [provider]  ondansetron (ZOFRAN) 4 MG tablet Take 4 mg by mouth as needed. 06/19/20   [provider]  pantoprazole (PROTONIX) 40 MG tablet Take 40 mg by mouth daily.    [provider]  potassium chloride SA (KLOR-CON) 20 MEQ tablet Take 2 tablets (40 mEq total) by mouth 2 (two) times daily. Take extra 2 tablets (40 meq) if you take metolazone 12/13/19   Darylene Price A, FNP  sertraline (ZOLOFT) 100 MG tablet Take 100 mg by mouth at bedtime. 04/18/19   [provider]  tiotropium (SPIRIVA) 18 MCG inhalation capsule Place 18 mcg into inhaler and inhale daily.    [provider]  torsemide (DEMADEX) 20 MG tablet Take 1 tablet (20 mg) by mouth twice daily 08/03/20   Furth, Cadence H, PA-C  traZODone (DESYREL) 50 MG tablet Take 50 mg by mouth at bedtime.    [provider]    Allergies Aspirin  Family History  Problem Relation Age of Onset   Diabetes Other    Hypertension Other    Diabetes Mother    Heart failure Mother    Heart disease Mother    Heart attack Mother    Stroke Mother    Depression Mother    Hypertension Mother    Cancer Sister        brain   Hypertension Sister    Diabetes Brother    Hypertension Brother    Heart failure Sister    Heart attack Sister    SIDS Sister     Social History Social History   Tobacco Use   Smoking status: Former    Packs/day: 0.25    Years: 20.00    Pack years: 5.00    Types: Cigarettes    Quit date: 07/01/2006    Years since quitting: 14.6   Smokeless tobacco: Never  Vaping Use   Vaping Use: Never used  Substance Use Topics   Alcohol use: No    Alcohol/week: 0.0 standard drinks   Drug use: No    Review of Systems   Review of Systems  Constitutional:  Negative for appetite change, chills and fever.  Respiratory:  Positive  for cough, chest tightness and shortness of breath.   Cardiovascular:  Positive for chest pain. Negative for palpitations and leg swelling.  Gastrointestinal:  Negative for abdominal pain, diarrhea, nausea and vomiting.  All other systems reviewed and are negative.  Physical Exam Updated Vital Signs BP (!) 153/77   Pulse (!) 54   Temp 98 F (36.7 C) (Oral)   Resp 18   Ht 5\' 11"  (1.803 m)   Wt 117.9 kg   SpO2 98%   BMI 36.25 kg/m   Physical Exam Vitals and nursing note reviewed.  Constitutional:      General: She is not in acute distress.    Appearance: Normal appearance.  HENT:     Head: Normocephalic and atraumatic.  Eyes:     General: No scleral icterus.    Conjunctiva/sclera: Conjunctivae normal.  Cardiovascular:     Rate and Rhythm: Normal rate and regular rhythm.  Pulmonary:     Effort: Pulmonary effort is normal. No respiratory distress.     Breath sounds: No stridor.     Comments: Mildly diminished aeration, no overt wheezing Abdominal:     General: Abdomen is flat. There is no distension.     Palpations: Abdomen is soft.     Tenderness: There is no abdominal tenderness. There is no guarding.  Musculoskeletal:        General: No deformity or signs of injury.     Cervical back: Normal range of motion.     Right lower leg: No edema.     Left lower leg: No edema.  Skin:    General: Skin is dry.     Coloration: Skin is not jaundiced or pale.  Neurological:     General: No focal deficit present.     Mental Status: She is alert and oriented to person, place, and time. Mental status is at baseline.  Psychiatric:        Mood and Affect: Mood normal.        Behavior: Behavior normal.     LABS (all labs ordered are listed, but only abnormal results are displayed)  Labs Reviewed  CBC WITH DIFFERENTIAL/PLATELET - Abnormal; Notable for the following components:      Result Value   RBC 3.66 (*)    Hemoglobin 9.0 (*)    HCT 29.3 (*)    MCH 24.6 (*)    All  other components within normal limits  COMPREHENSIVE METABOLIC PANEL  TROPONIN I (HIGH SENSITIVITY)  TROPONIN I (HIGH SENSITIVITY)   ____________________________________________  EKG  NSR, nml axis, nml intervals, diffuse T wave flattening, no acute ischemic changes  ____________________________________________  RADIOLOGY Almeta Monas, personally viewed and evaluated these images (plain radiographs) as part of my medical decision making, as well as reviewing the written report by the radiologist.  ED MD interpretation:  I reviewed the CXR which does not show any acute cardiopulmonary process      ____________________________________________   PROCEDURES  Procedure(s) performed (including Critical Care):  Procedures   ____________________________________________   INITIAL IMPRESSION / ASSESSMENT AND PLAN / ED COURSE     Patient is a 75 year old female with history of heart failure COPD who presents with dyspnea cough and chest pain.  Start about 2 weeks ago.  Vital signs within normal limits other than hypertension.  She is not hypoxic.  On exam she is not in any respiratory distress.  Breath sounds somewhat diminished but no overt wheezing.  She does not look  volume overloaded.  Chest x-ray does not show any infiltrate or pulmonary edema.  Suspect viral bronchitis.  However given her chest pain will obtain labs, EKG and troponin.  This chest pain seems to have been more of an ongoing issue for her is nonexertional and she thinks is from her hiatal hernia so low suspicion for ACS.    Patient feeling significant improved after DuoNeb and steroids.  Aeration improved on exam.  Her troponin is negative EKG is nonischemic.  Will discharge with 5 days of prednisone.  We discussed return precautions. Clinical Course as of 02/11/21 1800  Mon Feb 11, 2021  1612 Hemoglobin(!): 9.0 baseline [KM]    Clinical Course User Index [KM] Rada Hay, MD      ____________________________________________   FINAL CLINICAL IMPRESSION(S) / ED DIAGNOSES  Final diagnoses:  COPD exacerbation [facility])     ED Discharge Orders          Ordered    predniSONE (DELTASONE) 50 MG tablet        02/11/21 1655             Note:  This document was prepared using Dragon voice recognition software and may include unintentional dictation errors.    Rada Hay, MD 02/11/21 1800

## 2021-02-14 ENCOUNTER — Other Ambulatory Visit: Payer: Self-pay | Admitting: Medical

## 2021-02-20 ENCOUNTER — Other Ambulatory Visit: Payer: Self-pay

## 2021-02-20 ENCOUNTER — Ambulatory Visit: Payer: Medicare HMO | Admitting: Gastroenterology

## 2021-02-20 NOTE — Progress Notes (Deleted)
Gastroenterology Consultation  Referring Provider:     Minna Merritts, MD Primary Care Physician:  Crissie Figures, PA-C Primary Gastroenterologist:  Dr. Allen Norris     Reason for Consultation:     Dysphagia        HPI:   Tina Hayes is a 75 y.o. y/o female referred for consultation & management of dysphagia by Dr. Crissie Figures, PA-C.  This patient comes in today with a history of dysphagia.  The patient reported that she had a Schatzki's ring with dilation to 16 mm in 2019 by Dr. Vira Agar.  He reported that the food had been hanging up to her cardiologist and was referred to me for evaluation.  Past Medical History:  Diagnosis Date   (HFpEF) heart failure with preserved ejection fraction ([place])    a. 05/2016 Echo: EF 60-65%, mild to mod LVH, Gr1 DD, mild MR, mildly dil LA, mod TR, mildly to mod increased PASP.   Acute diastolic heart failure (HCC) 01/27/2017   Anxiety    Arthritis    Chest pain 06/16/2016   CHF (congestive heart failure) (HCC)    Chronic back pain    Chronic diastolic congestive heart failure ([place]) 02/13/2017   COPD (chronic obstructive pulmonary disease) ([place])    Coronary artery disease    a. s/p remote PCI x 5;  b. 2006 s/p CABG x 3 ([place]); b. 05/2016 MV: attenuation corrected images w/o ischemia or wma-->Med rx.   Coronary artery disease of native artery of native heart with stable angina pectoris ([place]) 06/17/2016   Depression    Diabetes mellitus without complication ([place])    Essential hypertension 06/30/2016   GERD (gastroesophageal reflux disease)    Heart attack ([place])    Total of 3 per pt.   Hypertension    Hypertensive urgency 06/03/2015   Seizure ([place])    Seizures ([place])     Past Surgical History:  Procedure Laterality Date   ABDOMINAL HYSTERECTOMY     ANKLE SURGERY Right    CATARACT EXTRACTION W/ INTRAOCULAR LENS  IMPLANT, BILATERAL     COLONOSCOPY WITH PROPOFOL N/A 05/04/2017   Procedure: COLONOSCOPY WITH  PROPOFOL;  Surgeon: [name], MD;  Location: [facility] ENDOSCOPY;  Service: Endoscopy;  Laterality: N/A;   CORONARY ANGIOPLASTY     CORONARY ARTERY BYPASS GRAFT     TRIPLE BYPASS   ESOPHAGOGASTRODUODENOSCOPY (EGD) WITH PROPOFOL N/A 05/04/2017   Procedure: ESOPHAGOGASTRODUODENOSCOPY (EGD) WITH PROPOFOL;  Surgeon: [name], MD;  Location: [facility] ENDOSCOPY;  Service: Endoscopy;  Laterality: N/A;   EXCISION MASS LOWER EXTREMETIES Right 03/02/2018   Procedure: EXCISION SOFT TISSUE MASS FROM MEDIAL ASPECT OF RIGHT ANKLE;  Surgeon: [name], MD;  Location: [facility] ORS;  Service: Orthopedics;  Laterality: Right;   FRACTURE SURGERY     JOINT REPLACEMENT     right   KNEE SURGERY Right    LOWER EXTREMITY ANGIOGRAPHY Right 07/14/2019   Procedure: LOWER EXTREMITY ANGIOGRAPHY;  Surgeon: Algernon Huxley, MD;  Location: [place] CV LAB;  Service: Cardiovascular;  Laterality: Right;   MOUTH SURGERY Left 07/14/2017   TOTAL KNEE ARTHROPLASTY Left 09/27/2019   Procedure: TOTAL KNEE ARTHROPLASTY;  Surgeon: [name], MD;  Location: [facility] ORS;  Service: Orthopedics;  Laterality: Left;    Prior to Admission medications   Medication Sig Start Date End Date Taking? Authorizing Provider  albuterol (PROVENTIL HFA;VENTOLIN HFA) 108 (90 Base) MCG/ACT inhaler Inhale 2 puffs into the lungs every  6 (six) hours as needed for wheezing or shortness of breath. 08/30/15   Epifanio Lesches, MD  albuterol (PROVENTIL) (2.5 MG/3ML) 0.083% nebulizer solution Take 2.5 mg every 4 (four) hours as needed by nebulization for wheezing or shortness of breath.    [provider]  amLODipine (NORVASC) 5 MG tablet Take 1 tablet (5 mg total) by mouth daily. 08/10/20 01/14/21  Minna Merritts, MD  atorvastatin (LIPITOR) 40 MG tablet Take 1 tablet (40 mg total) by mouth daily. 12/25/20   Minna Merritts, MD  B-D UF III MINI PEN NEEDLES 31G X 5 MM MISC USE WITH INSULIN PEN INJECTIONS TWICE DAILY 08/05/18   [provider]  carvedilol (COREG) 3.125 MG tablet Take 1 tablet (3.125 mg total) by mouth daily as needed. 12/25/20   Minna Merritts, MD  clopidogrel (PLAVIX) 75 MG tablet Take 1 tablet (75 mg total) by mouth daily. Patient not taking: Reported on 01/14/2021 05/19/19   Alisa Graff, FNP  Dupilumab (DUPIXENT) 300 MG/2ML SOPN Inject 300 mg into the skin every 14 (fourteen) days.     [provider]  ferrous sulfate 325 (65 FE) MG tablet Take 325 mg by mouth daily.    [provider]  HYDROcodone-acetaminophen (NORCO) 7.5-325 MG tablet Take 1 tablet by mouth every 6 (six) hours as needed for moderate pain or severe pain. 02/01/21 03/03/21  Molli Barrows, MD  HYDROcodone-acetaminophen (NORCO) 7.5-325 MG tablet Take 1 tablet by mouth every 6 (six) hours as needed for moderate pain or severe pain. 03/02/21 04/01/21  Molli Barrows, MD  Ipratropium-Albuterol (COMBIVENT) 20-100 MCG/ACT AERS respimat Inhale into the lungs. 11/22/20 11/22/21  [provider]  isosorbide mononitrate (IMDUR) 30 MG 24 hr tablet Take 1 tablet (30 mg total) by mouth 2 (two) times daily. 12/25/20   Minna Merritts, MD  KEPPRA 1000 MG tablet Take 1,000 mg by mouth 2 (two) times daily. 12/17/20   [provider]  LANTUS SOLOSTAR 100 UNIT/ML Solostar Pen Inject 30-40 Units into the skin See admin instructions. Inject 30 units subcutaneously in the morning at 40 units subcutaneously at bedtime. 05/26/19   [provider]  levETIRAcetam (KEPPRA) 750 MG tablet Take 750 mg by mouth 2 (two) times daily.    [provider]  losartan (COZAAR) 100 MG tablet Take 1 tablet (100 mg total) by mouth daily. 12/25/20   Minna Merritts, MD  meloxicam (MOBIC) 7.5 MG tablet Take 7.5 mg by mouth daily as needed for pain.  Patient not taking: No sig reported    [provider]  montelukast (SINGULAIR) 10 MG tablet Take 10 mg by mouth daily.  11/05/17   [provider]  nitroGLYCERIN  (NITROSTAT) 0.4 MG SL tablet DISSOLVE ONE TABLET UNDER THE TONGUE EVERY 5 MINUTES AS NEEDED FOR CHEST PAIN.  DO NOT EXCEED A TOTAL OF 3 DOSES IN 15 MINUTES 07/09/18   Gollan, Kathlene November, MD  NOVOLOG FLEXPEN 100 UNIT/ML FlexPen Inject 10 Units into the skin 3 (three) times daily with meals. 06/07/19   [provider]  ondansetron (ZOFRAN) 4 MG tablet Take 4 mg by mouth as needed. 06/19/20   [provider]  pantoprazole (PROTONIX) 40 MG tablet Take 40 mg by mouth daily.    [provider]  potassium chloride SA (KLOR-CON) 20 MEQ tablet Take 2 tablets (40 mEq total) by mouth 2 (two) times daily. Take extra 2 tablets (40 meq) if you take metolazone 12/13/19   Enola,  Aura Fey, FNP  predniSONE (DELTASONE) 50 MG tablet Take 1 pill daily for 5 days 02/11/21   Rada Hay, MD  sertraline (ZOLOFT) 100 MG tablet Take 100 mg by mouth at bedtime. 04/18/19   [provider]  tiotropium (SPIRIVA) 18 MCG inhalation capsule Place 18 mcg into inhaler and inhale daily.    [provider]  torsemide (DEMADEX) 20 MG tablet Take 1 tablet (20 mg) by mouth twice daily 08/03/20   Furth, Cadence H, PA-C  traZODone (DESYREL) 50 MG tablet Take 50 mg by mouth at bedtime.    [provider]    Family History  Problem Relation Age of Onset   Diabetes Other    Hypertension Other    Diabetes Mother    Heart failure Mother    Heart disease Mother    Heart attack Mother    Stroke Mother    Depression Mother    Hypertension Mother    Cancer Sister        brain   Hypertension Sister    Diabetes Brother    Hypertension Brother    Heart failure Sister    Heart attack Sister    SIDS Sister      Social History   Tobacco Use   Smoking status: Former    Packs/day: 0.25    Years: 20.00    Pack years: 5.00    Types: Cigarettes    Quit date: 07/01/2006    Years since quitting: 14.6   Smokeless tobacco: Never  Vaping Use   Vaping Use: Never used  Substance Use  Topics   Alcohol use: No    Alcohol/week: 0.0 standard drinks   Drug use: No    Allergies as of 02/20/2021 - Review Complete 02/11/2021  Allergen Reaction Noted   Aspirin Anaphylaxis, Shortness Of Breath, and Swelling 04/14/2015    Review of Systems:    All systems reviewed and negative except where noted in HPI.   Physical Exam:  There were no vitals taken for this visit. No LMP recorded. Patient has had a hysterectomy. General:   Alert,  Well-developed, well-nourished, pleasant and cooperative in NAD Head:  Normocephalic and atraumatic. Eyes:  Sclera clear, no icterus.   Conjunctiva pink. Ears:  Normal auditory acuity. Neck:  Supple; no masses or thyromegaly. Lungs:  Respirations even and unlabored.  Clear throughout to auscultation.   No wheezes, crackles, or rhonchi. No acute distress. Heart:  Regular rate and rhythm; no murmurs, clicks, rubs, or gallops. Abdomen:  Normal bowel sounds.  No bruits.  Soft, non-tender and non-distended without masses, hepatosplenomegaly or hernias noted.  No guarding or rebound tenderness.  Negative Carnett sign.   Rectal:  Deferred.  Pulses:  Normal pulses noted. Extremities:  No clubbing or edema.  No cyanosis. Neurologic:  Alert and oriented x3;  grossly normal neurologically. Skin:  Intact without significant lesions or rashes.  No jaundice. Lymph Nodes:  No significant cervical adenopathy. Psych:  Alert and cooperative. Normal mood and affect.  Imaging Studies: DG Chest 1 View  Result Date: 02/11/2021 CLINICAL DATA:  Cough EXAM: CHEST  1 VIEW COMPARISON:  Chest x-ray dated September 30, 2019 FINDINGS: Unchanged cardiomegaly. Tortuosity of the thoracic aorta. Prior median sternotomy and CABG. Moderate hiatal hernia. Mild bibasilar atelectasis. No focal consolidation. No large pleural effusion evidence of pneumothorax. IMPRESSION: Mildly atelectasis, lungs otherwise clear. Electronically Signed   By: Yetta Glassman M.D.   On: 02/11/2021 15:43     Assessment and Plan:  Tina Hayes is a 75 y.o. y/o female ***    Lucilla Lame, MD. Marval Regal    Note: This dictation was prepared with Dragon dictation along with smaller phrase technology. Any transcriptional errors that result from this process are unintentional.

## 2021-02-24 ENCOUNTER — Other Ambulatory Visit: Payer: Self-pay

## 2021-02-24 ENCOUNTER — Inpatient Hospital Stay
Admission: EM | Admit: 2021-02-24 | Discharge: 2021-02-26 | DRG: 378 | Disposition: A | Discharge status: Home / Self Care | Payer: Medicare Other | Attending: Internal Medicine | Admitting: Internal Medicine

## 2021-02-24 ENCOUNTER — Emergency Department: Payer: Medicare Other

## 2021-02-24 ENCOUNTER — Encounter: Payer: Self-pay | Admitting: Emergency Medicine

## 2021-02-24 DIAGNOSIS — E1165 Type 2 diabetes mellitus with hyperglycemia: Secondary | ICD-10-CM | POA: Diagnosis not present

## 2021-02-24 DIAGNOSIS — Z7902 Long term (current) use of antithrombotics/antiplatelets: Secondary | ICD-10-CM

## 2021-02-24 DIAGNOSIS — D509 Iron deficiency anemia, unspecified: Secondary | ICD-10-CM | POA: Diagnosis present

## 2021-02-24 DIAGNOSIS — D62 Acute posthemorrhagic anemia: Secondary | ICD-10-CM | POA: Diagnosis not present

## 2021-02-24 DIAGNOSIS — K250 Acute gastric ulcer with hemorrhage: Secondary | ICD-10-CM | POA: Diagnosis present

## 2021-02-24 DIAGNOSIS — K253 Acute gastric ulcer without hemorrhage or perforation: Secondary | ICD-10-CM | POA: Diagnosis present

## 2021-02-24 DIAGNOSIS — Z951 Presence of aortocoronary bypass graft: Secondary | ICD-10-CM

## 2021-02-24 DIAGNOSIS — I5032 Chronic diastolic (congestive) heart failure: Secondary | ICD-10-CM | POA: Diagnosis not present

## 2021-02-24 DIAGNOSIS — R569 Unspecified convulsions: Secondary | ICD-10-CM | POA: Diagnosis not present

## 2021-02-24 DIAGNOSIS — K260 Acute duodenal ulcer with hemorrhage: Principal | ICD-10-CM | POA: Diagnosis present

## 2021-02-24 DIAGNOSIS — K219 Gastro-esophageal reflux disease without esophagitis: Secondary | ICD-10-CM | POA: Diagnosis present

## 2021-02-24 DIAGNOSIS — J432 Centrilobular emphysema: Secondary | ICD-10-CM

## 2021-02-24 DIAGNOSIS — F32A Depression, unspecified: Secondary | ICD-10-CM | POA: Diagnosis present

## 2021-02-24 DIAGNOSIS — J449 Chronic obstructive pulmonary disease, unspecified: Secondary | ICD-10-CM | POA: Diagnosis not present

## 2021-02-24 DIAGNOSIS — Z96652 Presence of left artificial knee joint: Secondary | ICD-10-CM | POA: Diagnosis present

## 2021-02-24 DIAGNOSIS — R9431 Abnormal electrocardiogram [ECG] [EKG]: Secondary | ICD-10-CM

## 2021-02-24 DIAGNOSIS — Z886 Allergy status to analgesic agent status: Secondary | ICD-10-CM

## 2021-02-24 DIAGNOSIS — Z8249 Family history of ischemic heart disease and other diseases of the circulatory system: Secondary | ICD-10-CM | POA: Diagnosis not present

## 2021-02-24 DIAGNOSIS — K449 Diaphragmatic hernia without obstruction or gangrene: Secondary | ICD-10-CM | POA: Diagnosis present

## 2021-02-24 DIAGNOSIS — K922 Gastrointestinal hemorrhage, unspecified: Secondary | ICD-10-CM

## 2021-02-24 DIAGNOSIS — I110 Hypertensive heart disease with heart failure: Secondary | ICD-10-CM | POA: Diagnosis present

## 2021-02-24 DIAGNOSIS — Z833 Family history of diabetes mellitus: Secondary | ICD-10-CM

## 2021-02-24 DIAGNOSIS — K921 Melena: Secondary | ICD-10-CM | POA: Diagnosis present

## 2021-02-24 DIAGNOSIS — I252 Old myocardial infarction: Secondary | ICD-10-CM

## 2021-02-24 DIAGNOSIS — I25118 Atherosclerotic heart disease of native coronary artery with other forms of angina pectoris: Secondary | ICD-10-CM | POA: Diagnosis not present

## 2021-02-24 DIAGNOSIS — Z20822 Contact with and (suspected) exposure to covid-19: Secondary | ICD-10-CM | POA: Diagnosis not present

## 2021-02-24 DIAGNOSIS — Z9861 Coronary angioplasty status: Secondary | ICD-10-CM

## 2021-02-24 DIAGNOSIS — Z823 Family history of stroke: Secondary | ICD-10-CM

## 2021-02-24 DIAGNOSIS — G8929 Other chronic pain: Secondary | ICD-10-CM | POA: Diagnosis present

## 2021-02-24 DIAGNOSIS — Z808 Family history of malignant neoplasm of other organs or systems: Secondary | ICD-10-CM | POA: Diagnosis not present

## 2021-02-24 DIAGNOSIS — R079 Chest pain, unspecified: Secondary | ICD-10-CM

## 2021-02-24 DIAGNOSIS — Z818 Family history of other mental and behavioral disorders: Secondary | ICD-10-CM

## 2021-02-24 DIAGNOSIS — I209 Angina pectoris, unspecified: Secondary | ICD-10-CM | POA: Diagnosis not present

## 2021-02-24 DIAGNOSIS — F419 Anxiety disorder, unspecified: Secondary | ICD-10-CM | POA: Diagnosis not present

## 2021-02-24 DIAGNOSIS — D649 Anemia, unspecified: Secondary | ICD-10-CM | POA: Diagnosis not present

## 2021-02-24 DIAGNOSIS — K920 Hematemesis: Secondary | ICD-10-CM | POA: Diagnosis not present

## 2021-02-24 DIAGNOSIS — Z79899 Other long term (current) drug therapy: Secondary | ICD-10-CM

## 2021-02-24 DIAGNOSIS — Z794 Long term (current) use of insulin: Secondary | ICD-10-CM

## 2021-02-24 DIAGNOSIS — Z87891 Personal history of nicotine dependence: Secondary | ICD-10-CM | POA: Diagnosis not present

## 2021-02-24 DIAGNOSIS — Z7952 Long term (current) use of systemic steroids: Secondary | ICD-10-CM

## 2021-02-24 DIAGNOSIS — M1990 Unspecified osteoarthritis, unspecified site: Secondary | ICD-10-CM | POA: Diagnosis present

## 2021-02-24 DIAGNOSIS — R1084 Generalized abdominal pain: Secondary | ICD-10-CM

## 2021-02-24 DIAGNOSIS — I2510 Atherosclerotic heart disease of native coronary artery without angina pectoris: Secondary | ICD-10-CM | POA: Diagnosis present

## 2021-02-24 LAB — COMPREHENSIVE METABOLIC PANEL
ALT: 15 U/L (ref 0–44)
AST: 16 U/L (ref 15–41)
Albumin: 3.3 g/dL — ABNORMAL LOW (ref 3.5–5.0)
Alkaline Phosphatase: 113 U/L (ref 38–126)
Anion gap: 7 (ref 5–15)
BUN: 33 mg/dL — ABNORMAL HIGH (ref 8–23)
CO2: 26 mmol/L (ref 22–32)
Calcium: 8.4 mg/dL — ABNORMAL LOW (ref 8.9–10.3)
Chloride: 105 mmol/L (ref 98–111)
Creatinine, Ser: 0.92 mg/dL (ref 0.44–1.00)
GFR, Estimated: >60 mL/min (ref >60)
Glucose, Bld: 378 mg/dL — ABNORMAL HIGH (ref 70–99)
Potassium: 4.3 mmol/L (ref 3.5–5.1)
Sodium: 138 mmol/L (ref 135–145)
Total Bilirubin: 0.8 mg/dL (ref 0.3–1.2)
Total Protein: 6.9 g/dL (ref 6.5–8.1)

## 2021-02-24 LAB — CBC
HCT: 24.2 % — ABNORMAL LOW (ref 36.0–46.0)
HCT: 22 % — ABNORMAL LOW (ref 36.0–46.0)
Hemoglobin: 7.3 g/dL — ABNORMAL LOW (ref 12.0–15.0)
Hemoglobin: 6.7 g/dL — ABNORMAL LOW (ref 12.0–15.0)
MCH: 24.7 pg — ABNORMAL LOW (ref 26.0–34.0)
MCH: 24.9 pg — ABNORMAL LOW (ref 26.0–34.0)
MCHC: 30.2 g/dL (ref 30.0–36.0)
MCHC: 30.5 g/dL (ref 30.0–36.0)
MCV: 82 fL (ref 80.0–100.0)
MCV: 81.8 fL (ref 80.0–100.0)
Platelets: 285 10*3/uL (ref 150–400)
Platelets: 277 10*3/uL (ref 150–400)
RBC: 2.95 MIL/uL — ABNORMAL LOW (ref 3.87–5.11)
RBC: 2.69 MIL/uL — ABNORMAL LOW (ref 3.87–5.11)
RDW: 16.2 % — ABNORMAL HIGH (ref 11.5–15.5)
RDW: 16.2 % — ABNORMAL HIGH (ref 11.5–15.5)
WBC: 7.7 10*3/uL (ref 4.0–10.5)
WBC: 8.4 10*3/uL (ref 4.0–10.5)
nRBC: 0 % (ref 0.0–0.2)
nRBC: 0.2 % (ref 0.0–0.2)

## 2021-02-24 LAB — RESP PANEL BY RT-PCR (FLU A&B, COVID) ARPGX2
Influenza A by PCR: NEGATIVE
Influenza B by PCR: NEGATIVE
SARS Coronavirus 2 by RT PCR: NEGATIVE

## 2021-02-24 LAB — DIFFERENTIAL
Abs Immature Granulocytes: 0.23 10*3/uL — ABNORMAL HIGH (ref 0.00–0.07)
Basophils Absolute: 0.1 10*3/uL (ref 0.0–0.1)
Basophils Relative: 1 %
Eosinophils Absolute: 0.1 10*3/uL (ref 0.0–0.5)
Eosinophils Relative: 1 %
Immature Granulocytes: 3 %
Lymphocytes Relative: 26 %
Lymphs Abs: 2.2 10*3/uL (ref 0.7–4.0)
Monocytes Absolute: 0.6 10*3/uL (ref 0.1–1.0)
Monocytes Relative: 7 %
Neutro Abs: 5.2 10*3/uL (ref 1.7–7.7)
Neutrophils Relative %: 62 %
Smear Review: NORMAL

## 2021-02-24 LAB — HEMOGLOBIN A1C
Hgb A1c MFr Bld: 7.7 % — ABNORMAL HIGH (ref 4.8–5.6)
Mean Plasma Glucose: 174.29 mg/dL

## 2021-02-24 LAB — TROPONIN I (HIGH SENSITIVITY)
Troponin I (High Sensitivity): 12 ng/L (ref <18)
Troponin I (High Sensitivity): 12 ng/L (ref <18)

## 2021-02-24 LAB — PROTIME-INR
INR: 0.9 (ref 0.8–1.2)
Prothrombin Time: 12.3 seconds (ref 11.4–15.2)

## 2021-02-24 LAB — PREPARE RBC (CROSSMATCH)

## 2021-02-24 MED ORDER — PANTOPRAZOLE SODIUM 40 MG IV SOLR: 40.0000 mg | Freq: Two times a day (BID) | INTRAVENOUS | Status: DC

## 2021-02-24 MED ORDER — PANTOPRAZOLE 80MG IVPB - SIMPLE MED
80.0000 mg | Freq: Once | INTRAVENOUS | Status: AC
  Administered 2021-02-24: 80 mg via INTRAVENOUS
  Filled 2021-02-24: qty 80

## 2021-02-24 MED ORDER — BISACODYL 10 MG RE SUPP
10.0000 mg | Freq: Every day | RECTAL | Status: DC | PRN
  Filled 2021-02-24: qty 1

## 2021-02-24 MED ORDER — SODIUM CHLORIDE 0.9 % IV SOLN: Freq: Once | INTRAVENOUS | Status: DC

## 2021-02-24 MED ORDER — INSULIN GLARGINE-YFGN 100 UNIT/ML ~~LOC~~ SOLN
20.0000 [IU] | Freq: Every day | SUBCUTANEOUS | Status: DC
  Administered 2021-02-24 – 2021-02-25 (×2): 20 [IU] via SUBCUTANEOUS
  Filled 2021-02-24 (×3): qty 0.2

## 2021-02-24 MED ORDER — ONDANSETRON HCL 4 MG/2ML IJ SOLN: 4.0000 mg | Freq: Four times a day (QID) | INTRAMUSCULAR | Status: DC | PRN

## 2021-02-24 MED ORDER — SODIUM CHLORIDE 0.9 % IV SOLN
10.0000 mL/h | Freq: Once | INTRAVENOUS | Status: DC
Start: 2021-02-24 — End: 2021-02-27

## 2021-02-24 MED ORDER — SENNOSIDES-DOCUSATE SODIUM 8.6-50 MG PO TABS: 1.0000 | ORAL_TABLET | Freq: Every evening | ORAL | Status: DC | PRN

## 2021-02-24 MED ORDER — ONDANSETRON HCL 4 MG PO TABS: 4.0000 mg | ORAL_TABLET | Freq: Four times a day (QID) | ORAL | Status: DC | PRN

## 2021-02-24 MED ORDER — PANTOPRAZOLE INFUSION (NEW) - SIMPLE MED
8.0000 mg/h | INTRAVENOUS | Status: DC
  Administered 2021-02-24 – 2021-02-25 (×2): 8 mg/h via INTRAVENOUS
  Filled 2021-02-24: qty 100
  Filled 2021-02-24: qty 80

## 2021-02-24 MED ORDER — ACETAMINOPHEN 325 MG PO TABS: 650.0000 mg | ORAL_TABLET | Freq: Four times a day (QID) | ORAL | Status: DC | PRN

## 2021-02-24 MED ORDER — IOHEXOL 300 MG/ML  SOLN: 100.0000 mL | Freq: Once | INTRAMUSCULAR | Status: DC | PRN

## 2021-02-24 MED ORDER — ACETAMINOPHEN 650 MG RE SUPP
650.0000 mg | Freq: Four times a day (QID) | RECTAL | Status: DC | PRN
  Filled 2021-02-24: qty 1

## 2021-02-24 NOTE — Assessment & Plan Note (Signed)
Pt states that she has had melena on and off for a long time. Recently, in the last few days it has been more constant. She has had chest/epigastric pain for the last 2 weeks. In the last day she has had coffee ground emesis. She also has not been taking her

## 2021-02-24 NOTE — ED Triage Notes (Signed)
Pt reports has been having trouble with her acid reflux for several days and this am she started with NVD. Pt reports her stools and vomit was black in color. Pt also reports decrease in appetite and weakness

## 2021-02-24 NOTE — Assessment & Plan Note (Signed)
Noted. As needed albuterol nebs are available.

## 2021-02-24 NOTE — ED Provider Notes (Signed)
[facility] Emergency Department Provider Note   ____________________________________________   Event Date/Time   First MD Initiated Contact with Patient 02/24/21 1207     (approximate)  I have reviewed the triage vital signs and the nursing notes.   HISTORY  Chief Complaint Chest Pain, Hematemesis, and Abdominal Pain   HPI Tina Hayes is a 75 y.o. female who reports trouble with her acid reflux for the last few days and then today began having nausea vomiting diarrhea.  She said her stools and vomit were black.  She is complaining to me of nausea and abdominal pain currently.  She stopped taking her Plavix 2 days ago so she can have a spinal tap she says.  Abdominal pain is diffuse moderate and achy pain going on since at least this morning.         Past Medical History:  Diagnosis Date   (HFpEF) heart failure with preserved ejection fraction ([place])    a. 05/2016 Echo: EF 60-65%, mild to mod LVH, Gr1 DD, mild MR, mildly dil LA, mod TR, mildly to mod increased PASP.   Acute diastolic heart failure (HCC) 01/27/2017   Anxiety    Arthritis    Chest pain 06/16/2016   CHF (congestive heart failure) (HCC)    Chronic back pain    Chronic diastolic congestive heart failure ([place]) 02/13/2017   COPD (chronic obstructive pulmonary disease) ([place])    Coronary artery disease    a. s/p remote PCI x 5;  b. 2006 s/p CABG x 3 ([place], [place]); b. 05/2016 MV: attenuation corrected images w/o ischemia or wma-->Med rx.   Coronary artery disease of native artery of native heart with stable angina pectoris ([place]) 06/17/2016   Depression    Diabetes mellitus without complication ([place])    Essential hypertension 06/30/2016   GERD (gastroesophageal reflux disease)    Heart attack ([place])    Total of 3 per pt.   Hypertension    Hypertensive urgency 06/03/2015   Seizure ([place])    Seizures [facility]     Patient Active Problem List   Diagnosis Date Noted    Status post total knee replacement using cement, 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   Atherosclerosis of native arteries of the extremities with ulceration ([place]) 07/05/2019   Onychomycosis 05/30/2019   Gastroesophageal reflux disease with hiatal hernia 12/14/2018   Presbyesophagus 12/14/2018   Occlusion and stenosis of vertebral artery 08/31/2018   AKI (acute kidney injury) ([place]) 05/25/2018   BMI 50.0-59.9, adult ([place]) 04/16/2018   Seizures ([place]) 11/10/2017   Mass of right lower leg 09/11/2017   Dysphagia 04/23/2017   Chronic diastolic congestive heart failure ([place]) 62/13/0865   Acute diastolic heart failure ([place]) 01/27/2017   Depression 12/11/2016   Essential hypertension 06/30/2016   Snoring 06/30/2016   Diabetes mellitus ([place]) 06/30/2016   Coronary artery disease of native artery of native heart with stable angina pectoris ([place]) 06/17/2016   Morbid obesity ([place]) 06/17/2016   COPD (chronic obstructive pulmonary disease) ([place]) 08/28/2015   COPD with acute exacerbation ([place]) 04/14/2015    Past Surgical History:  Procedure Laterality Date   ABDOMINAL HYSTERECTOMY     ANKLE SURGERY Right    CATARACT EXTRACTION W/ INTRAOCULAR LENS  IMPLANT, BILATERAL     COLONOSCOPY WITH PROPOFOL N/A 05/04/2017   Procedure: COLONOSCOPY WITH PROPOFOL;  Surgeon: [name], MD;  Location: [facility] ENDOSCOPY;  Service: Endoscopy;  Laterality: N/A;   CORONARY ANGIOPLASTY     CORONARY ARTERY BYPASS  GRAFT     TRIPLE BYPASS   ESOPHAGOGASTRODUODENOSCOPY (EGD) WITH PROPOFOL N/A 05/04/2017   Procedure: ESOPHAGOGASTRODUODENOSCOPY (EGD) WITH PROPOFOL;  Surgeon: [name], MD;  Location: [facility] ENDOSCOPY;  Service: Endoscopy;  Laterality: N/A;   EXCISION MASS LOWER EXTREMETIES Right 03/02/2018   Procedure: EXCISION SOFT TISSUE MASS FROM MEDIAL ASPECT OF RIGHT ANKLE;  Surgeon: [name], MD;  Location: [facility] ORS;  Service: Orthopedics;  Laterality: Right;   FRACTURE SURGERY     JOINT REPLACEMENT     right   KNEE SURGERY  Right    LOWER EXTREMITY ANGIOGRAPHY Right 07/14/2019   Procedure: LOWER EXTREMITY ANGIOGRAPHY;  Surgeon: Algernon Huxley, MD;  Location: [facility];  Service: Cardiovascular;  Laterality: Right;   MOUTH SURGERY Left 07/14/2017   TOTAL KNEE ARTHROPLASTY Left 09/27/2019   Procedure: TOTAL KNEE ARTHROPLASTY;  Surgeon: [name], MD;  Location: [facility] ORS;  Service: Orthopedics;  Laterality: Left;    Prior to Admission medications   Medication Sig Start Date End Date Taking? Authorizing Provider  albuterol (PROVENTIL HFA;VENTOLIN HFA) 108 (90 Base) MCG/ACT inhaler Inhale 2 puffs into the lungs every 6 (six) hours as needed for wheezing or shortness of breath. 08/30/15   Epifanio Lesches, MD  albuterol (PROVENTIL) (2.5 MG/3ML) 0.083% nebulizer solution Take 2.5 mg every 4 (four) hours as needed by nebulization for wheezing or shortness of breath.    [provider]  amLODipine (NORVASC) 5 MG tablet Take 1 tablet (5 mg total) by mouth daily. 08/10/20 01/14/21  Minna Merritts, MD  atorvastatin (LIPITOR) 40 MG tablet Take 1 tablet (40 mg total) by mouth daily. 12/25/20   Minna Merritts, MD  B-D UF III MINI PEN NEEDLES 31G X 5 MM MISC USE WITH INSULIN PEN INJECTIONS TWICE DAILY 08/05/18   [provider]  carvedilol (COREG) 3.125 MG tablet Take 1 tablet (3.125 mg total) by mouth daily as needed. 12/25/20   Minna Merritts, MD  clopidogrel (PLAVIX) 75 MG tablet Take 1 tablet (75 mg total) by mouth daily. Patient not taking: Reported on 01/14/2021 05/19/19   Alisa Graff, FNP  Dupilumab (DUPIXENT) 300 MG/2ML SOPN Inject 300 mg into the skin every 14 (fourteen) days.     [provider]  ferrous sulfate 325 (65 FE) MG tablet Take 325 mg by mouth daily.    [provider]  HYDROcodone-acetaminophen (NORCO) 7.5-325 MG tablet Take 1 tablet by mouth every 6 (six) hours as needed for moderate pain or severe pain. 02/01/21 03/03/21  Molli Barrows, MD   HYDROcodone-acetaminophen (NORCO) 7.5-325 MG tablet Take 1 tablet by mouth every 6 (six) hours as needed for moderate pain or severe pain. 03/02/21 04/01/21  Molli Barrows, MD  Ipratropium-Albuterol (COMBIVENT) 20-100 MCG/ACT AERS respimat Inhale into the lungs. 11/22/20 11/22/21  [provider]  isosorbide mononitrate (IMDUR) 30 MG 24 hr tablet Take 1 tablet (30 mg total) by mouth 2 (two) times daily. 12/25/20   Minna Merritts, MD  KEPPRA 1000 MG tablet Take 1,000 mg by mouth 2 (two) times daily. 12/17/20   [provider]  LANTUS SOLOSTAR 100 UNIT/ML Solostar Pen Inject 30-40 Units into the skin See admin instructions. Inject 30 units subcutaneously in the morning at 40 units subcutaneously at bedtime. 05/26/19   [provider]  levETIRAcetam (KEPPRA) 750 MG tablet Take 750 mg by mouth 2 (two) times daily.    [provider]  losartan (COZAAR) 100 MG tablet Take 1  tablet (100 mg total) by mouth daily. 12/25/20   Minna Merritts, MD  meloxicam (MOBIC) 7.5 MG tablet Take 7.5 mg by mouth daily as needed for pain.  Patient not taking: No sig reported    [provider]  montelukast (SINGULAIR) 10 MG tablet Take 10 mg by mouth daily.  11/05/17   [provider]  nitroGLYCERIN (NITROSTAT) 0.4 MG SL tablet DISSOLVE ONE TABLET UNDER THE TONGUE EVERY 5 MINUTES AS NEEDED FOR CHEST PAIN.  DO NOT EXCEED A TOTAL OF 3 DOSES IN 15 MINUTES 07/09/18   Gollan, Kathlene November, MD  NOVOLOG FLEXPEN 100 UNIT/ML FlexPen Inject 10 Units into the skin 3 (three) times daily with meals. 06/07/19   [provider]  ondansetron (ZOFRAN) 4 MG tablet Take 4 mg by mouth as needed. 06/19/20   [provider]  pantoprazole (PROTONIX) 40 MG tablet Take 40 mg by mouth daily.    [provider]  potassium chloride SA (KLOR-CON) 20 MEQ tablet Take 2 tablets (40 mEq total) by mouth 2 (two) times daily. Take extra 2 tablets (40 meq) if you take metolazone 12/13/19    Darylene Price A, FNP  predniSONE (DELTASONE) 50 MG tablet Take 1 pill daily for 5 days 02/11/21   Rada Hay, MD  sertraline (ZOLOFT) 100 MG tablet Take 100 mg by mouth at bedtime. 04/18/19   [provider]  tiotropium (SPIRIVA) 18 MCG inhalation capsule Place 18 mcg into inhaler and inhale daily.    [provider]  torsemide (DEMADEX) 20 MG tablet Take 1 tablet (20 mg) by mouth twice daily 08/03/20   Furth, Cadence H, PA-C  traZODone (DESYREL) 50 MG tablet Take 50 mg by mouth at bedtime.    [provider]    Allergies Aspirin  Family History  Problem Relation Age of Onset   Diabetes Other    Hypertension Other    Diabetes Mother    Heart failure Mother    Heart disease Mother    Heart attack Mother    Stroke Mother    Depression Mother    Hypertension Mother    Cancer Sister        brain   Hypertension Sister    Diabetes Brother    Hypertension Brother    Heart failure Sister    Heart attack Sister    SIDS Sister     Social History Social History   Tobacco Use   Smoking status: Former    Packs/day: 0.25    Years: 20.00    Pack years: 5.00    Types: Cigarettes    Quit date: 07/01/2006    Years since quitting: 14.6   Smokeless tobacco: Never  Vaping Use   Vaping Use: Never used  Substance Use Topics   Alcohol use: No    Alcohol/week: 0.0 standard drinks   Drug use: No    Review of Systems  Constitutional: No fever/chills Eyes: No visual changes. ENT: No sore throat. Cardiovascular: Denies chest pain. Respiratory: Denies shortness of breath. Gastrointestinal:abdominal pain.  nausea, vomiting.  No diarrhea.  No constipation. Genitourinary: Negative for dysuria. Musculoskeletal: Negative for back pain. Skin: Negative for rash. Neurological: Negative for headaches, focal weakness   ____________________________________________   PHYSICAL EXAM:  VITAL SIGNS: ED Triage Vitals [02/24/21 1028]  Enc Vitals Group     BP       Pulse      Resp      Temp      Temp src  SpO2      Weight 260 lb (117.9 kg)     Height 5\' 11"  (1.803 m)     Head Circumference      Peak Flow      Pain Score 7     Pain Loc      Pain Edu?      Excl. in [place]?     Constitutional: Alert and oriented.  Looks uncomfortable Eyes: Conjunctivae are normal.  Head: Atraumatic. Nose: No congestion/rhinnorhea. Mouth/Throat: Mucous membranes are moist.  Oropharynx non-erythematous. Neck: No stridor.  Cardiovascular: Normal rate, regular rhythm. Grossly normal heart sounds.  Good peripheral circulation. Respiratory: Normal respiratory effort.  No retractions. Lungs CTAB. Gastrointestinal: Soft diffusely tender no distention. No abdominal bruits.  Musculoskeletal: No lower extremity tenderness nor edema.   Neurologic:  Normal speech and language. No gross focal neurologic deficits are appreciated.  Skin:  Skin is warm, dry and intact. No rash noted. Psychiatric: Mood and affect are normal. Speech and behavior are normal.  ____________________________________________   LABS (all labs ordered are listed, but only abnormal results are displayed)  Labs Reviewed  COMPREHENSIVE METABOLIC PANEL - Abnormal; Notable for the following components:      Result Value   Glucose, Bld 378 (*)    BUN 33 (*)    Calcium 8.4 (*)    Albumin 3.3 (*)    All other components within normal limits  CBC - Abnormal; Notable for the following components:   RBC 2.95 (*)    Hemoglobin 7.3 (*)    HCT 24.2 (*)    MCH 24.7 (*)    RDW 16.2 (*)    All other components within normal limits  DIFFERENTIAL  CBG MONITORING, ED  TYPE AND SCREEN  PREPARE RBC (CROSSMATCH)  TROPONIN I (HIGH SENSITIVITY)  TROPONIN I (HIGH SENSITIVITY)   ____________________________________________  EKG  EKG read interpreted by me shows normal sinus rhythm rate of 73 normal axis flipped T's inferiorly and from V1 to V4 this is new from previous  EKG. ____________________________________________  RADIOLOGY Gertha Calkin, personally viewed and evaluated these images (plain radiographs) as part of my medical decision making, as well as reviewing the written report by the radiologist.  ED MD interpretation: Chest x-ray read by radiology reviewed by me is stable there is a hiatal hernia present  Official radiology report(s): DG Chest 2 View  Result Date: 02/24/2021 CLINICAL DATA:  Weakness and hematemesis EXAM: CHEST - 2 VIEW COMPARISON:  02/11/2021 FINDINGS: Normal heart size and mediastinal contours. CABG. Sizable hiatal hernia with similar degree of gaseous distension. Coronary atherosclerosis. There is no edema, consolidation, effusion, or pneumothorax. IMPRESSION: Stable from prior.  No evidence of active disease. Electronically Signed   By: Jorje Guild M.D.   On: 02/24/2021 11:27    ____________________________________________   PROCEDURES  Procedure(s) performed (including Critical Care): Critical care time 45 minutes.  This includes multiple trips to the lobby to see the patient in the lobby and then get her moved into the hallway.  I also discussed her with GI read and reviewed her studies and labs.  Also reviewed her EKGs and compared all these with her old records old EKGs etc.  Procedures   ____________________________________________   INITIAL IMPRESSION / ASSESSMENT AND PLAN / ED COURSE  Patient has new EKG changes and melena and hematemesis with a decrease in her H&H.  2 weeks ago her H&H is 9 and 29 today at 7.3 and 24.  Today her sugars  378 as well.  I am endeavoring to get her back in the emergency room but currently all the beds are full.  Her troponins are stable at least.    ----------------------------------------- 1:31 PM on 02/24/2021 ----------------------------------------- Will admit patient.  She has EKG changes although her troponins are stable.  She is having worsening abdominal pain.  I  will start some Protonix IV for that now that we can get her back into the ER.  We will transfuse her as her H&H is low and I believe that her EKG changes etc. are due to that.  Additionally her H&H has been dropping.  GI will be investigated able to investigate whether this is an upper as is more likely or lower GI bleed.      ____________________________________________   FINAL CLINICAL IMPRESSION(S) / ED DIAGNOSES  Final diagnoses:  Hematemesis with nausea  Melena  Abnormal EKG  Generalized abdominal pain  Chest pain, unspecified type  Symptomatic anemia     ED Discharge Orders     None        Note:  This document was prepared using Dragon voice recognition software and may include unintentional dictation errors.    Nena Polio, MD 02/24/21 1332

## 2021-02-24 NOTE — ED Notes (Signed)
IV team consult placed after multiple RNs unsuccessful at getting IVs for patient to receive blood.

## 2021-02-24 NOTE — Consult Note (Signed)
Vonda Antigua, MD [address] [place], [place], [place], [address] [address], [place], [place], [place], [address] Phone: [phone]  Fax: [phone]  Consultation  Referring Provider:     Dr. Cinda Quest Primary Care Physician:  Crissie Figures, PA-C Reason for Consultation:     Melena  Date of Admission:  02/24/2021 Date of Consultation:  02/24/2021         HPI:   Tina Hayes is a 75 y.o. female who presents with 1 day history of melena and coffee-ground emesis.  Denies any previous history of similar symptoms.  Reports epigastric pain, 5/10, sharp, nonradiating, intermittent, not associated with meals.  Denies any NSAID use.  Is on Plavix at home.  Stopped taking it 2 days ago as she states she was scheduled to undergo epidural steroid injection and thus was told to hold her Plavix.  She has an upcoming appointment with pain management on 03/04/2021.  As per their last note on 01/14/2021 patient had been off her Plavix for 10 days on that day when they did an epidural steroid injection on 01/14/2021.  CT done on presentation to the ER for epigastric pain, shows a moderate to large fixed hiatal hernia.  Patient does have history of chronic anemia and has been previously seen by [facility] clinic GI.  As per their notes patient underwent EGD in January 2019 for dysphagia that showed a mild Schatzki's ring which was dilated.  Medium size hiatal hernia.  Gastritis in the antrum.  Normal duodenum.  Colonoscopy showed sigmoid diverticulosis and internal hemorrhoids at the time.  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  She has not had a capsule study in the past.  Past Medical History:  Diagnosis Date  . (HFpEF) heart failure with preserved ejection fraction ([place])    a. 05/2016 Echo: EF 60-65%, mild to mod LVH, Gr1 DD, mild MR, mildly dil LA, mod TR, mildly to mod increased PASP.  [name] Acute diastolic heart failure  ([place]) 01/27/2017  . Anxiety   . Arthritis   . Chest pain 06/16/2016  . CHF (congestive heart failure) ([place])   . Chronic back pain   . Chronic diastolic congestive heart failure ([place]) 02/13/2017  . COPD (chronic obstructive pulmonary disease) ([place])   . Coronary artery disease    a. s/p remote PCI x 5;  b. 2006 s/p CABG x 3 ([place], [place]); b. 05/2016 MV: attenuation corrected images w/o ischemia or wma-->Med rx.  . Coronary artery disease of native artery of native heart with stable angina pectoris ([place]) 06/17/2016  . Depression   . Diabetes mellitus without complication ([place])   . Essential hypertension 06/30/2016  . GERD (gastroesophageal reflux disease)   . Heart attack ([place])    Total of 3 per pt.  . Hypertension   . Hypertensive urgency 06/03/2015  . Seizure ([place])   . Seizures ([place])     Past Surgical History:  Procedure Laterality Date  . ABDOMINAL HYSTERECTOMY    . ANKLE SURGERY Right   . CATARACT EXTRACTION W/ INTRAOCULAR LENS  IMPLANT, BILATERAL    . COLONOSCOPY WITH PROPOFOL N/A 05/04/2017   Procedure: COLONOSCOPY WITH PROPOFOL;  Surgeon: [name], MD;  Location: [facility] ENDOSCOPY;  Service: Endoscopy;  Laterality: N/A;  . CORONARY ANGIOPLASTY    . CORONARY ARTERY BYPASS GRAFT     TRIPLE BYPASS  . ESOPHAGOGASTRODUODENOSCOPY (EGD) WITH PROPOFOL N/A 05/04/2017   Procedure:  ESOPHAGOGASTRODUODENOSCOPY (EGD) WITH PROPOFOL;  Surgeon: [name], MD;  Location: [facility] ENDOSCOPY;  Service: Endoscopy;  Laterality: N/A;  . EXCISION MASS LOWER EXTREMETIES Right 03/02/2018   Procedure: EXCISION SOFT TISSUE MASS FROM MEDIAL ASPECT OF RIGHT ANKLE;  Surgeon: [name], MD;  Location: [facility] ORS;  Service: Orthopedics;  Laterality: Right;  . FRACTURE SURGERY    . JOINT REPLACEMENT     right  . KNEE SURGERY Right   . LOWER EXTREMITY ANGIOGRAPHY Right 07/14/2019   Procedure: LOWER EXTREMITY ANGIOGRAPHY;  Surgeon: Algernon Huxley, MD;  Location: [place] [facility]  LAB;  Service: Cardiovascular;  Laterality: Right;  . MOUTH SURGERY Left 07/14/2017  . TOTAL KNEE ARTHROPLASTY Left 09/27/2019   Procedure: TOTAL KNEE ARTHROPLASTY;  Surgeon: [name], MD;  Location: [facility] ORS;  Service: Orthopedics;  Laterality: Left;    Prior to Admission medications   Medication Sig Start Date End Date Taking? Authorizing Provider  albuterol (PROVENTIL HFA;VENTOLIN HFA) 108 (90 Base) MCG/ACT inhaler Inhale 2 puffs into the lungs every 6 (six) hours as needed for wheezing or shortness of breath. 08/30/15   Epifanio Lesches, MD  albuterol (PROVENTIL) (2.5 MG/3ML) 0.083% nebulizer solution Take 2.5 mg every 4 (four) hours as needed by nebulization for wheezing or shortness of breath.    [provider]  amLODipine (NORVASC) 5 MG tablet Take 1 tablet (5 mg total) by mouth daily. 08/10/20 01/14/21  Minna Merritts, MD  atorvastatin (LIPITOR) 40 MG tablet Take 1 tablet (40 mg total) by mouth daily. 12/25/20   Minna Merritts, MD  B-D UF III MINI PEN NEEDLES 31G X 5 MM MISC USE WITH INSULIN PEN INJECTIONS TWICE DAILY 08/05/18   [provider]  carvedilol (COREG) 3.125 MG tablet Take 1 tablet (3.125 mg total) by mouth daily as needed. 12/25/20   Minna Merritts, MD  clopidogrel (PLAVIX) 75 MG tablet Take 1 tablet (75 mg total) by mouth daily. Patient not taking: Reported on 01/14/2021 05/19/19   Alisa Graff, FNP  Dupilumab (DUPIXENT) 300 MG/2ML SOPN Inject 300 mg into the skin every 14 (fourteen) days.     [provider]  ferrous sulfate 325 (65 FE) MG tablet Take 325 mg by mouth daily.    [provider]  HYDROcodone-acetaminophen (NORCO) 7.5-325 MG tablet Take 1 tablet by mouth every 6 (six) hours as needed for moderate pain or severe pain. 02/01/21 03/03/21  Molli Barrows, MD  HYDROcodone-acetaminophen (NORCO) 7.5-325 MG tablet Take 1 tablet by mouth every 6 (six) hours as needed for moderate pain or severe pain. 03/02/21 04/01/21  Molli Barrows, MD  Ipratropium-Albuterol (COMBIVENT) 20-100 MCG/ACT AERS respimat Inhale into the lungs. 11/22/20 11/22/21  [provider]  isosorbide mononitrate (IMDUR) 30 MG 24 hr tablet Take 1 tablet (30 mg total) by mouth 2 (two) times daily. 12/25/20   Minna Merritts, MD  KEPPRA 1000 MG tablet Take 1,000 mg by mouth 2 (two) times daily. 12/17/20   [provider]  LANTUS SOLOSTAR 100 UNIT/ML Solostar Pen Inject 30-40 Units into the skin See admin instructions. Inject 30 units subcutaneously in the morning at 40 units subcutaneously at bedtime. 05/26/19   [provider]  levETIRAcetam (KEPPRA) 750 MG tablet Take 750 mg by mouth 2 (two) times daily.    [provider]  losartan (COZAAR) 100 MG tablet Take 1 tablet (100 mg total) by mouth daily. 12/25/20   Minna Merritts, MD  meloxicam (MOBIC) 7.5  MG tablet Take 7.5 mg by mouth daily as needed for pain.  Patient not taking: No sig reported    [provider]  montelukast (SINGULAIR) 10 MG tablet Take 10 mg by mouth daily.  11/05/17   [provider]  nitroGLYCERIN (NITROSTAT) 0.4 MG SL tablet DISSOLVE ONE TABLET UNDER THE TONGUE EVERY 5 MINUTES AS NEEDED FOR CHEST PAIN.  DO NOT EXCEED A TOTAL OF 3 DOSES IN 15 MINUTES 07/09/18   Gollan, Kathlene November, MD  NOVOLOG FLEXPEN 100 UNIT/ML FlexPen Inject 10 Units into the skin 3 (three) times daily with meals. 06/07/19   [provider]  ondansetron (ZOFRAN) 4 MG tablet Take 4 mg by mouth as needed. 06/19/20   [provider]  pantoprazole (PROTONIX) 40 MG tablet Take 40 mg by mouth daily.    [provider]  potassium chloride SA (KLOR-CON) 20 MEQ tablet Take 2 tablets (40 mEq total) by mouth 2 (two) times daily. Take extra 2 tablets (40 meq) if you take metolazone 12/13/19   Darylene Price A, FNP  predniSONE (DELTASONE) 50 MG tablet Take 1 pill daily for 5 days 02/11/21   Rada Hay, MD  sertraline (ZOLOFT) 100 MG tablet Take 100  mg by mouth at bedtime. 04/18/19   [provider]  tiotropium (SPIRIVA) 18 MCG inhalation capsule Place 18 mcg into inhaler and inhale daily.    [provider]  torsemide (DEMADEX) 20 MG tablet Take 1 tablet (20 mg) by mouth twice daily 08/03/20   Furth, Cadence H, PA-C  traZODone (DESYREL) 50 MG tablet Take 50 mg by mouth at bedtime.    [provider]    Family History  Problem Relation Age of Onset  . Diabetes Other   . Hypertension Other   . Diabetes Mother   . Heart failure Mother   . Heart disease Mother   . Heart attack Mother   . Stroke Mother   . Depression Mother   . Hypertension Mother   . Cancer Sister        brain  . Hypertension Sister   . Diabetes Brother   . Hypertension Brother   . Heart failure Sister   . Heart attack Sister   . SIDS Sister      Social History   Tobacco Use  . Smoking status: Former    Packs/day: 0.25    Years: 20.00    Pack years: 5.00    Types: Cigarettes    Quit date: 07/01/2006    Years since quitting: 14.6  . Smokeless tobacco: Never  Vaping Use  . Vaping Use: Never used  Substance Use Topics  . Alcohol use: No    Alcohol/week: 0.0 standard drinks  . Drug use: No    Allergies as of 02/24/2021 - Review Complete 02/24/2021  Allergen Reaction Noted  . Aspirin Anaphylaxis, Shortness Of Breath, and Swelling 04/14/2015    Review of Systems:    All systems reviewed and negative except where noted in HPI.   Physical Exam:  Constitutional: General:   Alert,  Well-developed, well-nourished, pleasant and cooperative in NAD BP (!) 167/68 (BP Location: Left Arm)   Pulse 64   Temp 99.1 F (37.3 C) (Oral)   Resp 17   Ht 5\' 11"  (1.803 m)   Wt 117.9 kg   SpO2 98%   BMI 36.26 kg/m   Eyes:  Sclera clear, no icterus.   Conjunctiva pink. PERRLA  Ears:  No scars, lesions or masses, Normal auditory  acuity. Nose:  No deformity, discharge, or lesions. Mouth:  No deformity or lesions, oropharynx pink &  moist.  Neck:  Supple; no masses or thyromegaly.  Respiratory: Normal respiratory effort, Normal percussion  Gastrointestinal:  Normal bowel sounds.  No bruits.  Soft, non-tender and non-distended without masses, hepatosplenomegaly or hernias noted.  No guarding or rebound tenderness.     Cardiac: No clubbing or edema.  No cyanosis. Normal posterior tibial pedal pulses noted.  Lymphatic:  No significant cervical or axillary adenopathy.  Psych:  Alert and cooperative. Normal mood and affect.  Musculoskeletal:  Normal gait. Head normocephalic, atraumatic. Symmetrical without gross deformities. 5/5 Upper and Lower extremity strength bilaterally.  Skin: Warm. Intact without significant lesions or rashes. No jaundice.  Neurologic:  Face symmetrical, tongue midline, Normal sensation to touch;  grossly normal neurologically.  Psych:  Alert and oriented x3, Alert and cooperative. Normal mood and affect.   LAB RESULTS: Recent Labs    02/24/21 1031  WBC 7.7  HGB 7.3*  HCT 24.2*  PLT 285   BMET Recent Labs    02/24/21 1031  NA 138  K 4.3  CL 105  CO2 26  GLUCOSE 378*  BUN 33*  CREATININE 0.92  CALCIUM 8.4*   LFT Recent Labs    02/24/21 1031  PROT 6.9  ALBUMIN 3.3*  AST 16  ALT 15  ALKPHOS 113  BILITOT 0.8   PT/INR No results for input(s): LABPROT, INR in the last 72 hours.  STUDIES: CT ABDOMEN PELVIS WO CONTRAST  Result Date: 02/24/2021 CLINICAL DATA:  Epigastric pain EXAM: CT ABDOMEN AND PELVIS WITHOUT CONTRAST TECHNIQUE: Multidetector CT imaging of the abdomen and pelvis was performed following the standard protocol without IV contrast. COMPARISON:  None. FINDINGS: Lower chest: No significant abnormality is seen. Hepatobiliary: Liver measures 14.6 cm. No focal abnormality is seen in the liver. There is 2.7 cm increased density in the lumen of gallbladder. There is no wall thickening. There is no dilation of bile ducts. Pancreas: No focal abnormality is seen.  Spleen: Unremarkable Adrenals/Urinary Tract: Adrenals are unremarkable. There is no hydronephrosis. There are no renal or ureteral stones. Urinary bladder is unremarkable. Stomach/Bowel: Moderate to large sized fixed hiatal hernia is seen. Small bowel loops are not dilated. Appendix is not dilated. High density in the lumen of appendix may suggest oral medication or residual oral contrast from previous examinations. There is no pericecal inflammation. Multiple diverticula are seen in colon without signs of focal acute diverticulitis. Vascular/Lymphatic: There are scattered arterial calcifications including the coronary arteries. There is no significant lymphadenopathy. Reproductive: Uterus is not seen. There are no dominant adnexal masses. Other: There is no ascites or pneumoperitoneum. Umbilical hernia containing fat is seen. Musculoskeletal: Degenerative changes are noted in lumbar spine and lower thoracic spine with spinal stenosis and encroachment of neural foramina at multiple levels. There is mixed density lesion with sclerotic margins in the body L5 vertebra, possibly a chronic process. IMPRESSION: There is no evidence of intestinal obstruction or pneumoperitoneum. There is no hydronephrosis. Appendix is not dilated. Gallbladder stone. Diverticulosis of colon. Moderate to large fixed hiatal hernia. Coronary artery calcifications are seen. Other findings as described in the body of the report. Electronically Signed   By: Elmer Picker M.D.   On: 02/24/2021 14:07   DG Chest 2 View  Result Date: 02/24/2021 CLINICAL DATA:  Weakness and hematemesis EXAM: CHEST - 2 VIEW COMPARISON:  02/11/2021 FINDINGS: Normal heart size and mediastinal contours. CABG. Sizable hiatal hernia with  similar degree of gaseous distension. Coronary atherosclerosis. There is no edema, consolidation, effusion, or pneumothorax. IMPRESSION: Stable from prior.  No evidence of active disease. Electronically Signed   By: Jorje Guild M.D.   On: 02/24/2021 11:27    EKG normal sinus rhythm, ST and T wave abnormality  Impression / Plan:   Tina Hayes is a 75 y.o. y/o female with melena, coffee-ground emesis for 1 day at home, with last use of Plavix being 2 days ago  At this time patient is getting medical optimization with IV fluids, and IV Protonix.  IV Protonix was ordered but has not been started yet.  In addition, Dr. Cinda Quest noted new changes in her EKG which he attributes to her GI bleed.  Troponins are normal.  Will defer management for this to primary team versus cardiology consult.  Guidelines recommend medical optimization prior to endoscopic procedures which lead to better outcomes   After patient is better medically optimized with IV fluids, IV Protonix, can proceed with upper endoscopy for evaluation of upper GI bleed  However, if patient continues to have melena, or hematemesis, endoscopy can be considered sooner if clinical status changes and and patient does not able to wait for medical optimization.  In that setting patient will likely need to be intubated.  Patient has not had any further episodes of melena or hematemesis since presentation to the hospital.  Patient states last episode of hematemesis and melena was around 7 AM  Patient reports last dose of Plavix was 2 days ago, and therefore tomorrow would be day 3 off her Plavix.  Patient will be scheduled for upper endoscopy tomorrow with Dr. Vicente Males who will be following the patient as well.  Patient can be discussed with anesthesia tomorrow given T wave changes noted on EKG to see if they would like cardiology consult prior to proceeding with endoscopy  PPI IV twice daily  Continue serial CBCs and transfuse PRN Avoid NSAIDs Maintain 2 large-bore IV lines Please page GI with any acute hemodynamic changes, or signs of active GI bleeding  I have discussed alternative options, risks & benefits,  which include, but are not limited to, bleeding,  infection, perforation,respiratory complication & drug reaction.  The patient agrees with this plan & written consent will be obtained.     Thank you for involving me in the care of this patient.      LOS: 0 days   Virgel Manifold, MD  02/24/2021, 2:25 PM

## 2021-02-24 NOTE — Assessment & Plan Note (Signed)
Patient presented with complaints of chest pain/epigastric pain x 3-4 days. Inverted T's on EKG raise concerns for ischemia due to demand supply mismatch. Pt has also been off of her plavix for 2 days due to scheduled spinal injection next week. She will be monitored on telemetry. She is being transfused with 1 unit PRBC's x 1. H&H will be monitored and she will be transfused as necessary. EKG and troponins will be monitored. When possible, the patient should have work up for risk stratification for MI.

## 2021-02-24 NOTE — Assessment & Plan Note (Signed)
Continue protonix  

## 2021-02-24 NOTE — ED Triage Notes (Signed)
Pt in via EMS from home with c/o Cp intermittently for 3 days and this am around 0500 started with coffee ground emesis. Pt stopped taking plavix 2 days ago so she could have a spinal this week.

## 2021-02-24 NOTE — Assessment & Plan Note (Signed)
Patient with flipped T-waves in inferior leads in the setting of coffee ground emesis, melena, and a 1.7 gm loss of hemoglobin since 02/11/2021. Troponin negative. The patient will receive 1 unit of PRBC's in transfusion. Will continue to follow troponins and EKG. Should have cardiac work up for ischemia when GI bleed issue is resolved. She also has not been on plavix for 2 days in preparation for a spinal injection for her back pain.

## 2021-02-24 NOTE — ED Notes (Signed)
Called lab to draw patients labs

## 2021-02-24 NOTE — H&P (Signed)
Tina Hayes is an 75 y.o. female.   Chief Complaint: Chest pain for 3-4 days. coffee ground emesis, and melena for 1 day. HPI: The patient is a 75 yr old woman who states that she has intermittently had black stools for years. She states that she has brought it up to different providers, and nothing was made of it. She usually takes plavix for her CAD, but has been off of it since 02/22/2021. EKG demonstrates down going T's in inferior leads. Troponins are normal. Hemoglobin was 7.3 upon presentation down from 9.0 on 02/11/2021. 6.7 this afternoon. She is receiving IV protonix and GI has been consulted. She will be transfused with 1 unit PRBC's and more if necessary.   Medical history is significant for CAD, chronic diastolic heart failure, hiatal hernia, Chronic back pain, COPD, DM II, and HTN.  The patient denies fevers, chills, cough, nausea, she has had coffee ground emesis. Positive for epigastric and suprapubic abdominal pain. No constipation. Positive for black bowel movements. No neurological changes or lateralizing signs. No headaches or visual changes. No edema or increased abdominal girth. No joint swelling. No rashes, sores, or lesions.  The patient has been started on IV fluids, IV protonix, and transfusion has been ordered. GI has been consulted.   [facility] have been consulted to admit the patient for further evaluation and treatment. Past Medical History:  Diagnosis Date   (HFpEF) heart failure with preserved ejection fraction ([facility])    a. 05/2016 Echo: EF 60-65%, mild to mod LVH, Gr1 DD, mild MR, mildly dil LA, mod TR, mildly to mod increased PASP.   Acute diastolic heart failure (HCC) 01/27/2017   Anxiety    Arthritis    Chest pain 06/16/2016   CHF (congestive heart failure) (HCC)    Chronic back pain    Chronic diastolic congestive heart failure ([facility]) 02/13/2017   COPD (chronic obstructive pulmonary disease) ([facility])    Coronary artery disease    a. s/p remote PCI x 5;   b. 2006 s/p CABG x 3 ([facility], [facility]); b. 05/2016 MV: attenuation corrected images w/o ischemia or wma-->Med rx.   Coronary artery disease of native artery of native heart with stable angina pectoris ([facility]) 06/17/2016   Depression    Diabetes mellitus without complication ([facility])    Essential hypertension 06/30/2016   GERD (gastroesophageal reflux disease)    Heart attack ([facility])    Total of 3 per pt.   Hypertension    Hypertensive urgency 06/03/2015   Seizure (T[facility])    Seizures ([facility])     Past Surgical History:  Procedure Laterality Date   ABDOMINAL HYSTERECTOMY     ANKLE SURGERY Right    CATARACT EXTRACTION W/ INTRAOCULAR LENS  IMPLANT, BILATERAL     COLONOSCOPY WITH PROPOFOL N/A 05/04/2017   Procedure: COLONOSCOPY WITH PROPOFOL;  Surgeon: [name], MD;  [facility]: [facility];  Service: Endoscopy;  Laterality: N/A;   CORONARY ANGIOPLASTY     CORONARY ARTERY BYPASS GRAFT     TRIPLE BYPASS   ESOPHAGOGASTRODUODENOSCOPY (EGD) WITH PROPOFOL N/A 05/04/2017   Procedure: ESOPHAGOGASTRODUODENOSCOPY (EGD) WITH PROPOFOL;  Surgeon: [name], MD;  [facility]: [facility];  Service: Endoscopy;  Laterality: N/A;   EXCISION MASS LOWER EXTREMETIES Right 03/02/2018   Procedure: EXCISION SOFT TISSUE MASS FROM MEDIAL ASPECT OF RIGHT ANKLE;  Surgeon: [name], MD;  Location: [facility];  Service: Orthopedics;  Laterality: Right;   FRACTURE SURGERY     JOINT REPLACEMENT  right   KNEE SURGERY Right    LOWER EXTREMITY ANGIOGRAPHY Right 07/14/2019   Procedure: LOWER EXTREMITY ANGIOGRAPHY;  Surgeon: Algernon Huxley, MD;  Location: [facility];  Service: Cardiovascular;  Laterality: Right;   MOUTH SURGERY Left 07/14/2017   TOTAL KNEE ARTHROPLASTY Left 09/27/2019   Procedure: TOTAL KNEE ARTHROPLASTY;  Surgeon: [name], MD;  Location: [facility];  Service: Orthopedics;  Laterality: Left;    Family History  Problem Relation Age of Onset   Diabetes Other     Hypertension Other    Diabetes Mother    Heart failure Mother    Heart disease Mother    Heart attack Mother    Stroke Mother    Depression Mother    Hypertension Mother    Cancer Sister        brain   Hypertension Sister    Diabetes Brother    Hypertension Brother    Heart failure Sister    Heart attack Sister    SIDS Sister    Social History:  reports that she quit smoking about 14 years ago. Her smoking use included cigarettes. She has a 5.00 pack-year smoking history. She has never used smokeless tobacco. She reports that she does not drink alcohol and does not use drugs. (Not in a hospital admission)   Allergies:  Allergies  Allergen Reactions   Aspirin Anaphylaxis, Shortness Of Breath and Swelling    LIPS AND THROAT SWELL, DIFFICULT TO BREATH    Review of Systems - 12 systems reviewed with patient. Positive elements are included in the HPI above. All others are negative.  General appearance: alert, cooperative, appears stated age, fatigued, mild distress, and moderately obese Head: Normocephalic, without obvious abnormality, atraumatic Eyes: conjunctivae/corneas clear. PERRL, EOM's intact. Fundi benign. Throat: lips, mucosa, and tongue normal; teeth and gums normal Neck: no adenopathy, no carotid bruit, no JVD, supple, symmetrical, trachea midline, and thyroid not enlarged, symmetric, no tenderness/mass/nodules Resp:  No increased work of breathing. No wheezes, rales, or rhonchi. No tactile fremitus. Chest wall: no tenderness Cardio: regular rate and rhythm, S1, S2 normal, no murmur, click, rub or gallop GI:  Soft, tender in epigastrum and suprapubic regions.  Unable to auscultate bowel sounds. Unable to evaluate the abdomen for hernias, masses, or organomegaly  Extremities: extremities normal, atraumatic, no cyanosis or edema Pulses: Positive for 2+ pitting edema bilaterally. Distal pulses present bilaterally. Skin: Skin color, texture, turgor normal. No rashes or  lesions Lymph nodes: Cervical, supraclavicular, and axillary nodes normal. Neurologic: Alert and oriented X 3, normal strength and tone. Normal symmetric reflexes. Normal coordination and gait Incision/Wound: Non noted.  Results for orders placed or performed during the hospital encounter of 02/24/21 (from the past 48 hour(s))  Comprehensive metabolic panel     Status: Abnormal   Collection Time: 02/24/21 10:31 AM  Result Value Ref Range   Sodium 138 135 - 145 mmol/L   Potassium 4.3 3.5 - 5.1 mmol/L   Chloride 105 98 - 111 mmol/L   CO2 26 22 - 32 mmol/L   Glucose, Bld 378 (H) 70 - 99 mg/dL    Comment: Glucose reference range applies only to samples taken after fasting for at least 8 hours.   BUN 33 (H) 8 - 23 mg/dL   Creatinine, Ser 0.92 0.44 - 1.00 mg/dL   Calcium 8.4 (L) 8.9 - 10.3 mg/dL   Total Protein 6.9 6.5 - 8.1 g/dL   Albumin 3.3 (L) 3.5 - 5.0 g/dL  AST 16 15 - 41 U/L   ALT 15 0 - 44 U/L   Alkaline Phosphatase 113 38 - 126 U/L   Total Bilirubin 0.8 0.3 - 1.2 mg/dL   GFR, Estimated >60 >60 mL/min    Comment: (NOTE) Calculated using the CKD-EPI Creatinine Equation (2021)    Anion gap 7 5 - 15    Comment: Performed at [facility], [address] [place], [place] [address]  CBC     Status: Abnormal   Collection Time: 02/24/21 10:31 AM  Result Value Ref Range   WBC 7.7 4.0 - 10.5 K/uL   RBC 2.95 (L) 3.87 - 5.11 MIL/uL   Hemoglobin 7.3 (L) 12.0 - 15.0 g/dL   HCT 24.2 (L) 36.0 - 46.0 %   MCV 82.0 80.0 - 100.0 fL   MCH 24.7 (L) 26.0 - 34.0 pg   MCHC 30.2 30.0 - 36.0 g/dL   RDW 16.2 (H) 11.5 - 15.5 %   Platelets 285 150 - 400 K/uL   nRBC 0.0 0.0 - 0.2 %    Comment: Performed at [facility], [address] [place], [place] [address]  Type and screen [facility]     Status: None (Preliminary result)   Collection Time: 02/24/21 10:31 AM  Result Value Ref Range   ABO/RH(D) O POS    Antibody Screen NEG    Sample Expiration  02/27/2021,2359    Unit Number [id number]    Blood Component Type RED CELLS,LR    Unit division 00    Status of Unit ISSUED    Transfusion Status OK TO TRANSFUSE    Crossmatch Result Compatible    Unit Number [id number]    Blood Component Type RED CELLS,LR    Unit division 00    Status of Unit ISSUED    Transfusion Status OK TO TRANSFUSE    Crossmatch Result      Compatible Performed at [facility], [place], [place] [address]   Troponin I (High Sensitivity)     Status: None   Collection Time: 02/24/21 10:31 AM  Result Value Ref Range   Troponin I (High Sensitivity) 12 <18 ng/L    Comment: (NOTE) Elevated high sensitivity troponin I (hsTnI) values and significant  changes across serial measurements may suggest ACS but many other  chronic and acute conditions are known to elevate hsTnI results.  Refer to the "Links" section for chest pain algorithms and additional  guidance. Performed at [facility], [address] [place], [place] [address]   Prepare RBC (crossmatch)     Status: None   Collection Time: 02/24/21  1:11 PM  Result Value Ref Range   Order Confirmation      ORDER PROCESSED BY BLOOD BANK Performed at [facility], [address] [place], [place] [address]   Resp Panel by RT-PCR (Flu A&B, Covid) Nasopharyngeal Swab     Status: None   Collection Time: 02/24/21  1:50 PM   Specimen: Nasopharyngeal Swab; Nasopharyngeal(NP) swabs in vial transport medium  Result Value Ref Range   SARS Coronavirus 2 by RT PCR NEGATIVE NEGATIVE    Comment: (NOTE) SARS-CoV-2 target nucleic acids are NOT DETECTED.  The SARS-CoV-2 RNA is generally detectable in upper respiratory specimens during the acute phase of infection. The lowest concentration of SARS-CoV-2 viral copies this assay can detect is 138 copies/mL. A negative result does not preclude SARS-Cov-2 infection and should not be used as the sole basis for treatment  or other patient management decisions. A negative result may occur with  improper specimen collection/handling, submission of specimen other than nasopharyngeal swab, presence of viral mutation(s) within the areas targeted by this assay, and inadequate number of viral copies(<138 copies/mL). A negative result must be combined with clinical observations, patient history, and epidemiological information. The expected result is Negative.  Fact Sheet for Patients:  EntrepreneurPulse.com.au  Fact Sheet for Healthcare Providers:  IncredibleEmployment.be  This test is no t yet approved or cleared by the [place] FDA and  has been authorized for detection and/or diagnosis of SARS-CoV-2 by FDA under an Emergency Use Authorization (EUA). This EUA will remain  in effect (meaning this test can be used) for the duration of the COVID-19 declaration under Section 564(b)(1) of the Act, 21 U.S.C.section 360bbb-3(b)(1), unless the authorization is terminated  or revoked sooner.       Influenza A by PCR NEGATIVE NEGATIVE   Influenza B by PCR NEGATIVE NEGATIVE    Comment: (NOTE) The Xpert Xpress SARS-CoV-2/FLU/RSV plus assay is intended as an aid in the diagnosis of influenza from Nasopharyngeal swab specimens and should not be used as a sole basis for treatment. Nasal washings and aspirates are unacceptable for Xpert Xpress SARS-CoV-2/FLU/RSV testing.  Fact Sheet for Patients: EntrepreneurPulse.com.au  Fact Sheet for Healthcare Providers: IncredibleEmployment.be  This test is not yet approved or cleared by the [place] FDA and has been authorized for detection and/or diagnosis of SARS-CoV-2 by FDA under an Emergency Use Authorization (EUA). This EUA will remain in effect (meaning this test can be used) for the duration of the COVID-19 declaration under Section 564(b)(1) of the Act, 21 U.S.C. section  360bbb-3(b)(1), unless the authorization is terminated or revoked.  Performed at [facility], [address] [place], [place] [address]   Protime-INR     Status: None   Collection Time: 02/24/21  3:05 PM  Result Value Ref Range   Prothrombin Time 12.3 11.4 - 15.2 seconds   INR 0.9 0.8 - 1.2    Comment: (NOTE) INR goal varies based on device and disease states. Performed at [facility], [place], [place] [address]   Troponin I (High Sensitivity)     Status: None   Collection Time: 02/24/21  3:05 PM  Result Value Ref Range   Troponin I (High Sensitivity) 12 <18 ng/L    Comment: (NOTE) Elevated high sensitivity troponin I (hsTnI) values and significant  changes across serial measurements may suggest ACS but many other  chronic and acute conditions are known to elevate hsTnI results.  Refer to the "Links" section for chest pain algorithms and additional  guidance. Performed at [facility], [address] [place], [place] [address]   CBC     Status: Abnormal   Collection Time: 02/24/21  3:05 PM  Result Value Ref Range   WBC 8.4 4.0 - 10.5 K/uL    Comment: WHITE COUNT CONFIRMED ON SMEAR   RBC 2.69 (L) 3.87 - 5.11 MIL/uL   Hemoglobin 6.7 (L) 12.0 - 15.0 g/dL   HCT 22.0 (L) 36.0 - 46.0 %   MCV 81.8 80.0 - 100.0 fL   MCH 24.9 (L) 26.0 - 34.0 pg   MCHC 30.5 30.0 - 36.0 g/dL   RDW 16.2 (H) 11.5 - 15.5 %   Platelets 277 150 - 400 K/uL   nRBC 0.2 0.0 - 0.2 %    Comment: Performed at [facility], [address] [place], [place] [address]  Differential     Status: Abnormal   Collection Time: 02/24/21  3:05 PM  Result Value Ref Range   Neutrophils Relative % 62 %   Neutro Abs 5.2 1.7 - 7.7 K/uL   Lymphocytes Relative 26 %   Lymphs Abs 2.2 0.7 - 4.0 K/uL   Monocytes Relative 7 %   Monocytes Absolute 0.6 0.1 - 1.0 K/uL   Eosinophils Relative 1 %   Eosinophils Absolute 0.1 0.0 - 0.5 K/uL   Basophils Relative 1 %    Basophils Absolute 0.1 0.0 - 0.1 K/uL   WBC Morphology MILD LEFT SHIFT (1-5% METAS, OCC MYELO, OCC BANDS)    RBC Morphology MIXED RBC POPULATION    Smear Review Normal platelet morphology    Immature Granulocytes 3 %   Abs Immature Granulocytes 0.23 (H) 0.00 - 0.07 K/uL    Comment: Performed at [facility], [address] [place], [place] [address]   @[email or web address] @  Blood pressure (!) 162/57, pulse 60, temperature 98.4 F (36.9 C), temperature source Oral, resp. rate 17, height 5\' 11"  (1.803 m), weight 117.9 kg, SpO2 100 %.    Assessment/Plan Abnormal EKG Patient with flipped T-waves in inferior leads in the setting of coffee ground emesis, melena, and a 1.7 gm loss of hemoglobin since 02/11/2021. Troponin negative. The patient will receive 1 unit of PRBC's in transfusion. Will continue to follow troponins and EKG. Should have cardiac work up for ischemia when GI bleed issue is resolved. She also has not been on plavix for 2 days in preparation for a spinal injection for her back pain.  Acute upper GI bleed Pt states that she has had melena on and off for a long time. Recently, in the last few days it has been more constant. She has had chest/epigastric pain for the last 2 weeks. In the last day she has had coffee ground emesis. She also has not been taking her   Coronary artery disease Inverted T waves inferiorly. Patient has been off of plavix for 2 days due to scheduled spinal injection for chronic pain. Possibly due to demand/supply mismatch in the face of drop in hemoglobin due to GI bleed. She is being transfused with 1 unit PRBC's.  Will monitor on telemetry. Follow troponins and EKG. Consider cardiology consult in the am, although currently patient is not a candidate for anticoagulation due to GI bleed and anemia. GI has been consulted.  Anemia Chronic, with acute worsening. The patient had a hemoglobin of 9.0 on 10/17. It had dropped to 7.3 upon presentation. As of last  hemoglobin at 1900 it is 6.3 Transfusion of 1 unit PRBC's has been ordered. Monitor and transfuse as necessary for hemoglobin of less than 7.0.  COPD (chronic obstructive pulmonary disease) ([place]) Noted. As needed albuterol nebs are available.   GERD (gastroesophageal reflux disease) Continue protonix.  Angina pectoris [facility]) Patient presented with complaints of chest pain/epigastric pain x 3-4 days. Inverted T's on EKG raise concerns for ischemia due to demand supply mismatch. Pt has also been off of her plavix for 2 days due to scheduled spinal injection next week. She will be monitored on telemetry. She is being transfused with 1 unit PRBC's x 1. H&H will be monitored and she will be transfused as necessary. EKG and troponins will be monitored. When possible, the patient should have work up for risk stratification for MI.   I have seen and examined this patient myself. I have spent 72 minutes in her evaluation and admission.  Baley Lorimer 02/24/2021, 7:25 PM

## 2021-02-24 NOTE — ED Triage Notes (Signed)
Pt called from [facility] to treatment room, no response 

## 2021-02-24 NOTE — Assessment & Plan Note (Signed)
Inverted T waves inferiorly. Patient has been off of plavix for 2 days due to scheduled spinal injection for chronic pain. Possibly due to demand/supply mismatch in the face of drop in hemoglobin due to GI bleed. She is being transfused with 1 unit PRBC's.  Will monitor on telemetry. Follow troponins and EKG. Consider cardiology consult in the am, although currently patient is not a candidate for anticoagulation due to GI bleed and anemia. GI has been consulted.

## 2021-02-24 NOTE — Assessment & Plan Note (Signed)
Chronic, with acute worsening. The patient had a hemoglobin of 9.0 on 10/17. It had dropped to 7.3 upon presentation. As of last hemoglobin at 1900 it is 6.3 Transfusion of 1 unit PRBC's has been ordered. Monitor and transfuse as necessary for hemoglobin of less than 7.0.

## 2021-02-25 ENCOUNTER — Encounter: Payer: Self-pay | Admitting: Internal Medicine

## 2021-02-25 ENCOUNTER — Inpatient Hospital Stay: Payer: Medicare Other | Admitting: Anesthesiology

## 2021-02-25 ENCOUNTER — Encounter
Admission: EM | Disposition: A | Discharge status: Home / Self Care | Payer: Self-pay | Source: Home / Self Care | Attending: Internal Medicine

## 2021-02-25 DIAGNOSIS — K449 Diaphragmatic hernia without obstruction or gangrene: Secondary | ICD-10-CM

## 2021-02-25 DIAGNOSIS — K922 Gastrointestinal hemorrhage, unspecified: Secondary | ICD-10-CM | POA: Diagnosis not present

## 2021-02-25 DIAGNOSIS — K260 Acute duodenal ulcer with hemorrhage: Secondary | ICD-10-CM | POA: Diagnosis not present

## 2021-02-25 DIAGNOSIS — I209 Angina pectoris, unspecified: Secondary | ICD-10-CM | POA: Diagnosis not present

## 2021-02-25 DIAGNOSIS — R9431 Abnormal electrocardiogram [ECG] [EKG]: Secondary | ICD-10-CM | POA: Diagnosis not present

## 2021-02-25 DIAGNOSIS — K921 Melena: Secondary | ICD-10-CM | POA: Diagnosis present

## 2021-02-25 DIAGNOSIS — D649 Anemia, unspecified: Secondary | ICD-10-CM | POA: Diagnosis not present

## 2021-02-25 DIAGNOSIS — K920 Hematemesis: Secondary | ICD-10-CM | POA: Diagnosis present

## 2021-02-25 DIAGNOSIS — K253 Acute gastric ulcer without hemorrhage or perforation: Secondary | ICD-10-CM | POA: Diagnosis not present

## 2021-02-25 HISTORY — PX: ESOPHAGOGASTRODUODENOSCOPY: SHX5428

## 2021-02-25 LAB — HEMOGLOBIN AND HEMATOCRIT, BLOOD
HCT: 27.6 % — ABNORMAL LOW (ref 36.0–46.0)
HCT: 29.4 % — ABNORMAL LOW (ref 36.0–46.0)
Hemoglobin: 8.9 g/dL — ABNORMAL LOW (ref 12.0–15.0)
Hemoglobin: 9.6 g/dL — ABNORMAL LOW (ref 12.0–15.0)

## 2021-02-25 LAB — IRON AND TIBC
Iron: 43 ug/dL (ref 28–170)
Saturation Ratios: 13 % (ref 10.4–31.8)
TIBC: 335 ug/dL (ref 250–450)
UIBC: 292 ug/dL

## 2021-02-25 LAB — BPAM RBC
Blood Product Expiration Date: 202211012359
Blood Product Expiration Date: 202211262359
ISSUE DATE / TIME: 202210301513
ISSUE DATE / TIME: 202210301752
Unit Type and Rh: 5100
Unit Type and Rh: 5100

## 2021-02-25 LAB — GLUCOSE, CAPILLARY
Glucose-Capillary: 162 mg/dL — ABNORMAL HIGH (ref 70–99)
Glucose-Capillary: 157 mg/dL — ABNORMAL HIGH (ref 70–99)

## 2021-02-25 LAB — COMPREHENSIVE METABOLIC PANEL
ALT: 12 U/L (ref 0–44)
AST: 15 U/L (ref 15–41)
Albumin: 3 g/dL — ABNORMAL LOW (ref 3.5–5.0)
Alkaline Phosphatase: 71 U/L (ref 38–126)
Anion gap: 5 (ref 5–15)
BUN: 15 mg/dL (ref 8–23)
CO2: 26 mmol/L (ref 22–32)
Calcium: 8.1 mg/dL — ABNORMAL LOW (ref 8.9–10.3)
Chloride: 109 mmol/L (ref 98–111)
Creatinine, Ser: 0.74 mg/dL (ref 0.44–1.00)
GFR, Estimated: >60 mL/min (ref >60)
Glucose, Bld: 138 mg/dL — ABNORMAL HIGH (ref 70–99)
Potassium: 3.3 mmol/L — ABNORMAL LOW (ref 3.5–5.1)
Sodium: 140 mmol/L (ref 135–145)
Total Bilirubin: 0.6 mg/dL (ref 0.3–1.2)
Total Protein: 5.7 g/dL — ABNORMAL LOW (ref 6.5–8.1)

## 2021-02-25 LAB — TYPE AND SCREEN
ABO/RH(D): O POS
Antibody Screen: NEGATIVE
Unit division: 0
Unit division: 0

## 2021-02-25 LAB — FOLATE: Folate: 14.6 ng/mL (ref >5.9)

## 2021-02-25 LAB — CBC
HCT: 29.3 % — ABNORMAL LOW (ref 36.0–46.0)
Hemoglobin: 9.2 g/dL — ABNORMAL LOW (ref 12.0–15.0)
MCH: 26.1 pg (ref 26.0–34.0)
MCHC: 31.4 g/dL (ref 30.0–36.0)
MCV: 83.2 fL (ref 80.0–100.0)
Platelets: 223 10*3/uL (ref 150–400)
RBC: 3.52 MIL/uL — ABNORMAL LOW (ref 3.87–5.11)
RDW: 17.1 % — ABNORMAL HIGH (ref 11.5–15.5)
WBC: 8 10*3/uL (ref 4.0–10.5)
nRBC: 0 % (ref 0.0–0.2)

## 2021-02-25 LAB — TROPONIN I (HIGH SENSITIVITY): Troponin I (High Sensitivity): 11 ng/L (ref <18)

## 2021-02-25 LAB — MAGNESIUM: Magnesium: 1.8 mg/dL (ref 1.7–2.4)

## 2021-02-25 LAB — FERRITIN: Ferritin: 9 ng/mL — ABNORMAL LOW (ref 11–307)

## 2021-02-25 LAB — CBG MONITORING, ED: Glucose-Capillary: 127 mg/dL — ABNORMAL HIGH (ref 70–99)

## 2021-02-25 SURGERY — EGD (ESOPHAGOGASTRODUODENOSCOPY)
Anesthesia: General

## 2021-02-25 MED ORDER — AMLODIPINE BESYLATE 5 MG PO TABS
5.0000 mg | ORAL_TABLET | Freq: Every day | ORAL | Status: DC
  Administered 2021-02-25 – 2021-02-26 (×2): 5 mg via ORAL
  Filled 2021-02-25 (×2): qty 1

## 2021-02-25 MED ORDER — ALBUTEROL SULFATE HFA 108 (90 BASE) MCG/ACT IN AERS: 2.0000 | INHALATION_SPRAY | Freq: Four times a day (QID) | RESPIRATORY_TRACT | Status: DC | PRN

## 2021-02-25 MED ORDER — LEVETIRACETAM 750 MG PO TABS
750.0000 mg | ORAL_TABLET | Freq: Two times a day (BID) | ORAL | Status: DC
  Administered 2021-02-25 – 2021-02-26 (×2): 750 mg via ORAL
  Filled 2021-02-25 (×3): qty 1

## 2021-02-25 MED ORDER — FERROUS SULFATE 325 (65 FE) MG PO TABS
325.0000 mg | ORAL_TABLET | Freq: Every day | ORAL | Status: DC
  Administered 2021-02-26: 325 mg via ORAL
  Filled 2021-02-25: qty 1

## 2021-02-25 MED ORDER — TORSEMIDE 20 MG PO TABS
20.0000 mg | ORAL_TABLET | Freq: Two times a day (BID) | ORAL | Status: DC
  Administered 2021-02-25 – 2021-02-26 (×2): 20 mg via ORAL
  Filled 2021-02-25 (×4): qty 1

## 2021-02-25 MED ORDER — PROPOFOL 10 MG/ML IV BOLUS
INTRAVENOUS | Status: DC | PRN
  Administered 2021-02-25: 20 mg via INTRAVENOUS
  Administered 2021-02-25: 50 mg via INTRAVENOUS

## 2021-02-25 MED ORDER — PHENOL 1.4 % MT LIQD
1.0000 | OROMUCOSAL | Status: DC | PRN
  Administered 2021-02-25: 1 via OROMUCOSAL
  Filled 2021-02-25: qty 177

## 2021-02-25 MED ORDER — ISOSORBIDE MONONITRATE ER 30 MG PO TB24
30.0000 mg | ORAL_TABLET | Freq: Two times a day (BID) | ORAL | Status: DC
  Administered 2021-02-25 – 2021-02-26 (×2): 30 mg via ORAL
  Filled 2021-02-25 (×2): qty 1

## 2021-02-25 MED ORDER — INSULIN ASPART 100 UNIT/ML IJ SOLN
0.0000 [IU] | Freq: Three times a day (TID) | INTRAMUSCULAR | Status: DC
  Administered 2021-02-25 – 2021-02-26 (×4): 3 [IU] via SUBCUTANEOUS
  Filled 2021-02-25 (×2): qty 1

## 2021-02-25 MED ORDER — TIOTROPIUM BROMIDE MONOHYDRATE 18 MCG IN CAPS
18.0000 ug | ORAL_CAPSULE | Freq: Every day | RESPIRATORY_TRACT | Status: DC
  Administered 2021-02-26: 18 ug via RESPIRATORY_TRACT
  Filled 2021-02-25: qty 5

## 2021-02-25 MED ORDER — LIDOCAINE HCL (CARDIAC) PF 100 MG/5ML IV SOSY
PREFILLED_SYRINGE | INTRAVENOUS | Status: DC | PRN
  Administered 2021-02-25: 50 mg via INTRAVENOUS

## 2021-02-25 MED ORDER — POTASSIUM CHLORIDE CRYS ER 20 MEQ PO TBCR
40.0000 meq | EXTENDED_RELEASE_TABLET | Freq: Two times a day (BID) | ORAL | Status: DC
  Administered 2021-02-26: 40 meq via ORAL
  Filled 2021-02-25: qty 2

## 2021-02-25 MED ORDER — PROPOFOL 500 MG/50ML IV EMUL
INTRAVENOUS | Status: DC | PRN
  Administered 2021-02-25: 150 ug/kg/min via INTRAVENOUS

## 2021-02-25 MED ORDER — INSULIN GLARGINE 100 UNIT/ML SOLOSTAR PEN: 30.0000 [IU] | PEN_INJECTOR | SUBCUTANEOUS | Status: DC

## 2021-02-25 MED ORDER — HYDROCODONE-ACETAMINOPHEN 7.5-325 MG PO TABS
1.0000 | ORAL_TABLET | Freq: Four times a day (QID) | ORAL | Status: DC | PRN
  Administered 2021-02-25 – 2021-02-26 (×2): 1 via ORAL
  Filled 2021-02-25 (×2): qty 1

## 2021-02-25 MED ORDER — TRAZODONE HCL 50 MG PO TABS
50.0000 mg | ORAL_TABLET | Freq: Every day | ORAL | Status: DC
  Administered 2021-02-25: 50 mg via ORAL
  Filled 2021-02-25: qty 1

## 2021-02-25 MED ORDER — NITROGLYCERIN 0.4 MG SL SUBL: 0.4000 mg | SUBLINGUAL_TABLET | SUBLINGUAL | Status: DC | PRN

## 2021-02-25 MED ORDER — ONDANSETRON HCL 4 MG PO TABS: 4.0000 mg | ORAL_TABLET | ORAL | Status: DC | PRN

## 2021-02-25 MED ORDER — ALBUTEROL SULFATE (2.5 MG/3ML) 0.083% IN NEBU: 2.5000 mg | INHALATION_SOLUTION | RESPIRATORY_TRACT | Status: DC | PRN

## 2021-02-25 MED ORDER — POTASSIUM CHLORIDE CRYS ER 20 MEQ PO TBCR
40.0000 meq | EXTENDED_RELEASE_TABLET | Freq: Once | ORAL | Status: AC
  Administered 2021-02-25: 40 meq via ORAL
  Filled 2021-02-25: qty 2

## 2021-02-25 MED ORDER — ATORVASTATIN CALCIUM 20 MG PO TABS
40.0000 mg | ORAL_TABLET | Freq: Every day | ORAL | Status: DC
  Administered 2021-02-25: 40 mg via ORAL
  Filled 2021-02-25: qty 2

## 2021-02-25 MED ORDER — SODIUM CHLORIDE 0.9 % IV SOLN: INTRAVENOUS | Status: DC

## 2021-02-25 MED ORDER — SERTRALINE HCL 50 MG PO TABS
100.0000 mg | ORAL_TABLET | Freq: Every day | ORAL | Status: DC
  Administered 2021-02-25: 100 mg via ORAL
  Filled 2021-02-25: qty 2

## 2021-02-25 NOTE — H&P (Signed)
Cephas Darby, MD [address]  [place]  [place], [place] [address]  Main: [phone]  Fax: [phone] Pager: [phone]  Primary Care Physician:  Crissie Figures, PA-C Primary Gastroenterologist:  Dr. Cephas Darby  Pre-Procedure History & Physical: HPI:  Tina Hayes is a 75 y.o. female is here for an endoscopy.   Past Medical History:  Diagnosis Date   (HFpEF) heart failure with preserved ejection fraction ([place])    a. 05/2016 Echo: EF 60-65%, mild to mod LVH, Gr1 DD, mild MR, mildly dil LA, mod TR, mildly to mod increased PASP.   Acute diastolic heart failure (HCC) 01/27/2017   Anxiety    Arthritis    Chest pain 06/16/2016   CHF (congestive heart failure) (HCC)    Chronic back pain    Chronic diastolic congestive heart failure ([place]) 02/13/2017   COPD (chronic obstructive pulmonary disease) ([place])    Coronary artery disease    a. s/p remote PCI x 5;  b. 2006 s/p CABG x 3 ([place]); b. 05/2016 MV: attenuation corrected images w/o ischemia or wma-->Med rx.   Coronary artery disease of native artery of native heart with stable angina pectoris ([place]) 06/17/2016   Depression    Diabetes mellitus without complication ([place])    Essential hypertension 06/30/2016   GERD (gastroesophageal reflux disease)    Heart attack ([place])    Total of 3 per pt.   Hypertension    Hypertensive urgency 06/03/2015   Seizure ([place])    Seizures ([place])     Past Surgical History:  Procedure Laterality Date   ABDOMINAL HYSTERECTOMY     ANKLE SURGERY Right    CATARACT EXTRACTION W/ INTRAOCULAR LENS  IMPLANT, BILATERAL     COLONOSCOPY WITH PROPOFOL N/A 05/04/2017   Procedure: COLONOSCOPY WITH PROPOFOL;  Surgeon: [name], MD;  Location: [facility] ENDOSCOPY;  Service: Endoscopy;  Laterality: N/A;   CORONARY ANGIOPLASTY     CORONARY ARTERY BYPASS GRAFT     TRIPLE BYPASS   ESOPHAGOGASTRODUODENOSCOPY (EGD) WITH PROPOFOL N/A 05/04/2017   Procedure:  ESOPHAGOGASTRODUODENOSCOPY (EGD) WITH PROPOFOL;  Surgeon: [name], MD;  Location: [facility] ENDOSCOPY;  Service: Endoscopy;  Laterality: N/A;   EXCISION MASS LOWER EXTREMETIES Right 03/02/2018   Procedure: EXCISION SOFT TISSUE MASS FROM MEDIAL ASPECT OF RIGHT ANKLE;  Surgeon: [name], MD;  Location: [facility] ORS;  Service: Orthopedics;  Laterality: Right;   FRACTURE SURGERY     JOINT REPLACEMENT     right   KNEE SURGERY Right    LOWER EXTREMITY ANGIOGRAPHY Right 07/14/2019   Procedure: LOWER EXTREMITY ANGIOGRAPHY;  Surgeon: Algernon Huxley, MD;  Location: [place] CV LAB;  Service: Cardiovascular;  Laterality: Right;   MOUTH SURGERY Left 07/14/2017   TOTAL KNEE ARTHROPLASTY Left 09/27/2019   Procedure: TOTAL KNEE ARTHROPLASTY;  Surgeon: [name], MD;  Location: [facility] ORS;  Service: Orthopedics;  Laterality: Left;    Prior to Admission medications   Medication Sig Start Date End Date Taking? Authorizing Provider  acetaminophen (TYLENOL) 500 MG tablet Take 500 mg by mouth every 6 (six) hours as needed for headache, moderate pain or fever.   Yes [provider]  albuterol (PROVENTIL HFA;VENTOLIN HFA) 108 (90 Base) MCG/ACT inhaler Inhale 2 puffs into the lungs every 6 (six) hours as needed for wheezing or shortness of breath. 08/30/15  Yes Epifanio Lesches, MD  albuterol (PROVENTIL) (2.5 MG/3ML) 0.083% nebulizer solution Take 2.5 mg every 4 (four) hours as  needed by nebulization for wheezing or shortness of breath.   Yes [provider]  amLODipine (NORVASC) 5 MG tablet Take 1 tablet (5 mg total) by mouth daily. 08/10/20 02/24/21 Yes Gollan, Kathlene November, MD  carvedilol (COREG) 3.125 MG tablet Take 1 tablet (3.125 mg total) by mouth daily as needed. 12/25/20  Yes Minna Merritts, MD  clopidogrel (PLAVIX) 75 MG tablet Take 1 tablet (75 mg total) by mouth daily. 05/19/19  Yes Hackney, Otila Kluver A, FNP  esomeprazole (NEXIUM) 20 MG packet Take 20 mg by mouth daily before breakfast.    Yes [provider]  HYDROcodone-acetaminophen (NORCO) 7.5-325 MG tablet Take 1 tablet by mouth every 6 (six) hours as needed for moderate pain or severe pain. 02/01/21 03/03/21 Yes Molli Barrows, MD  Ipratropium-Albuterol (COMBIVENT) 20-100 MCG/ACT AERS respimat Inhale into the lungs. 11/22/20 11/22/21 Yes [provider]  isosorbide mononitrate (IMDUR) 30 MG 24 hr tablet Take 1 tablet (30 mg total) by mouth 2 (two) times daily. 12/25/20  Yes Gollan, Kathlene November, MD  LANTUS SOLOSTAR 100 UNIT/ML Solostar Pen Inject 30-40 Units into the skin See admin instructions. Inject 30 units subcutaneously in the morning at 40 units subcutaneously at bedtime. 05/26/19  Yes [provider]  levETIRAcetam (KEPPRA) 750 MG tablet Take 750 mg by mouth 2 (two) times daily.   Yes [provider]  losartan (COZAAR) 100 MG tablet Take 1 tablet (100 mg total) by mouth daily. 12/25/20  Yes Minna Merritts, MD  meloxicam (MOBIC) 7.5 MG tablet Take 7.5 mg by mouth daily as needed for pain.   Yes [provider]  pantoprazole (PROTONIX) 40 MG tablet Take 40 mg by mouth daily.   Yes [provider]  potassium chloride SA (KLOR-CON) 20 MEQ tablet Take 2 tablets (40 mEq total) by mouth 2 (two) times daily. Take extra 2 tablets (40 meq) if you take metolazone 12/13/19  Yes Darylene Price A, FNP  tiotropium (SPIRIVA) 18 MCG inhalation capsule Place 18 mcg into inhaler and inhale daily.   Yes [provider]  torsemide (DEMADEX) 20 MG tablet Take 1 tablet (20 mg) by mouth twice daily 08/03/20  Yes Furth, Cadence H, PA-C  traZODone (DESYREL) 50 MG tablet Take 50 mg by mouth at bedtime.   Yes [provider]  atorvastatin (LIPITOR) 40 MG tablet Take 1 tablet (40 mg total) by mouth daily. 12/25/20   Minna Merritts, MD  B-D UF III MINI PEN NEEDLES 31G X 5 MM MISC USE WITH INSULIN PEN INJECTIONS TWICE DAILY 08/05/18   [provider]  Dupilumab (DUPIXENT) 300 MG/2ML  SOPN Inject 300 mg into the skin every 14 (fourteen) days.  Patient not taking: Reported on 02/24/2021    [provider]  ferrous sulfate 325 (65 FE) MG tablet Take 325 mg by mouth daily. Patient not taking: Reported on 02/24/2021    [provider]  HYDROcodone-acetaminophen (NORCO) 7.5-325 MG tablet Take 1 tablet by mouth every 6 (six) hours as needed for moderate pain or severe pain. 03/02/21 04/01/21  Molli Barrows, MD  KEPPRA 1000 MG tablet Take 1,000 mg by mouth 2 (two) times daily. Patient not taking: No sig reported 12/17/20   [provider]  montelukast (SINGULAIR) 10 MG tablet Take 10 mg by mouth daily.  Patient not taking: Reported on 02/24/2021 11/05/17   [provider]  nitroGLYCERIN (NITROSTAT) 0.4 MG SL tablet DISSOLVE ONE TABLET UNDER THE TONGUE EVERY 5 MINUTES AS NEEDED FOR CHEST  PAIN.  DO NOT EXCEED A TOTAL OF 3 DOSES IN 15 MINUTES 07/09/18   Gollan, Kathlene November, MD  NOVOLOG FLEXPEN 100 UNIT/ML FlexPen Inject 10 Units into the skin 3 (three) times daily with meals. Patient not taking: Reported on 02/24/2021 06/07/19   [provider]  ondansetron (ZOFRAN) 4 MG tablet Take 4 mg by mouth as needed. 06/19/20   [provider]  predniSONE (DELTASONE) 50 MG tablet Take 1 pill daily for 5 days Patient not taking: No sig reported 02/11/21   Rada Hay, MD  sertraline (ZOLOFT) 100 MG tablet Take 100 mg by mouth at bedtime. Patient not taking: Reported on 02/24/2021 04/18/19   [provider]    Allergies as of 02/24/2021 - Review Complete 02/24/2021  Allergen Reaction Noted   Aspirin Anaphylaxis, Shortness Of Breath, and Swelling 04/14/2015    Family History  Problem Relation Age of Onset   Diabetes Other    Hypertension Other    Diabetes Mother    Heart failure Mother    Heart disease Mother    Heart attack Mother    Stroke Mother    Depression Mother    Hypertension Mother    Cancer Sister        brain    Hypertension Sister    Diabetes Brother    Hypertension Brother    Heart failure Sister    Heart attack Sister    SIDS Sister     Social History   Socioeconomic History   Marital status: Widowed    Spouse name: Not on file   Number of children: Not on file   Years of education: Not on file   Highest education level: Not on file  Occupational History   Not on file  Tobacco Use   Smoking status: Former    Packs/day: 0.25    Years: 20.00    Pack years: 5.00    Types: Cigarettes    Quit date: 07/01/2006    Years since quitting: 14.6   Smokeless tobacco: Never  Vaping Use   Vaping Use: Never used  Substance and Sexual Activity   Alcohol use: No    Alcohol/week: 0.0 standard drinks   Drug use: No   Sexual activity: Yes  Other Topics Concern   Not on file  Social History Narrative   Lives locally.  Does not routinely exercise.   Social Determinants of Health   Financial Resource Strain: Not on file  Food Insecurity: Not on file  Transportation Needs: Not on file  Physical Activity: Not on file  Stress: Not on file  Social Connections: Not on file  Intimate Partner Violence: Not on file    Review of Systems: See HPI, otherwise negative ROS  Physical Exam: BP 138/87   Pulse (!) 56   Temp (!) 97 F (36.1 C) (Temporal)   Resp 18   Ht 5\' 11"  (1.803 m)   Wt 117.9 kg   SpO2 100%   BMI 36.26 kg/m  General:   Alert,  pleasant and cooperative in NAD Head:  Normocephalic and atraumatic. Neck:  Supple; no masses or thyromegaly. Lungs:  Clear throughout to auscultation.    Heart:  Regular rate and rhythm. Abdomen:  Soft, nontender and nondistended. Normal bowel sounds, without guarding, and without rebound.   Neurologic:  Alert and  oriented x4;  grossly normal neurologically.  Impression/Plan: Tina Hayes is here for an endoscopy to be performed for melena, coffee ground emesis  Risks, benefits, limitations,  and alternatives regarding  endoscopy have been  reviewed with the patient.  Questions have been answered.  All parties agreeable.   Sherri Sear, MD  02/25/2021, 10:10 AM

## 2021-02-25 NOTE — ED Notes (Signed)
Report given to [facility], RN endo. Pt will be going to Endo this morning.

## 2021-02-25 NOTE — Assessment & Plan Note (Addendum)
Likely due to severe anemia.  Does not seem cardiac ischemic changes

## 2021-02-25 NOTE — Op Note (Signed)
[facility] Gastroenterology Patient Name: Tina Hayes Procedure Date: 02/25/2021 10:41 AM MRN: [id number] Account #: [id number] Date of Birth: 08/20/1945 Admit Type: Inpatient Age: 75 Room: [facility] ENDO ROOM 4 Gender: Female Note Status: Finalized Instrument Name: Upper Endoscope [id number] Procedure:             Upper GI endoscopy Indications:           Iron deficiency anemia secondary to chronic blood                         loss, Iron deficiency anemia, Coffee-ground emesis,                         Melena Providers:             Lin Landsman MD, MD Medicines:             General Anesthesia Complications:         No immediate complications. Estimated blood loss: None. Procedure:             Pre-Anesthesia Assessment:                        - Prior to the procedure, a History and Physical was                         performed, and patient medications and allergies were                         reviewed. The patient is competent. The risks and                         benefits of the procedure and the sedation options and                         risks were discussed with the patient. All questions                         were answered and informed consent was obtained.                         Patient identification and proposed procedure were                         verified by the physician, the nurse, the                         anesthesiologist, the anesthetist and the technician                         in the pre-procedure area in the procedure room in the                         endoscopy suite. Mental Status Examination: alert and                         oriented. Airway Examination: normal oropharyngeal                         airway and neck  mobility. Respiratory Examination:                         clear to auscultation. CV Examination: normal.                         Prophylactic Antibiotics: The patient does not require                          prophylactic antibiotics. Prior Anticoagulants: The                         patient has taken no previous anticoagulant or                         antiplatelet agents. ASA Grade Assessment: III - A                         patient with severe systemic disease. After reviewing                         the risks and benefits, the patient was deemed in                         satisfactory condition to undergo the procedure. The                         anesthesia plan was to use general anesthesia.                         Immediately prior to administration of medications,                         the patient was re-assessed for adequacy to receive                         sedatives. The heart rate, respiratory rate, oxygen                         saturations, blood pressure, adequacy of pulmonary                         ventilation, and response to care were monitored                         throughout the procedure. The physical status of the                         patient was re-assessed after the procedure.                        After obtaining informed consent, the endoscope was                         passed under direct vision. Throughout the procedure,                         the patient's blood pressure, pulse, and oxygen  saturations were monitored continuously. The Endoscope                         was introduced through the mouth, and advanced to the                         second part of duodenum. The upper GI endoscopy was                         accomplished without difficulty. The patient tolerated                         the procedure well. Findings:      The duodenal bulb and second portion of the duodenum were normal.      An 8 cm hiatal hernia with a few non bleeding Cameron erosions and one       small clean based ulcer was found. The proximal extent of the gastric       folds (end of tubular esophagus) was 32 cm from the incisors. The hiatal        narrowing was 40 cm from the incisors. The Z-line was 32 cm from the       incisors.      Diffuse moderately erythematous mucosa without bleeding was found in the       gastric body. Biopsies were taken with a cold forceps for histology.      The incisura and gastric antrum were normal. Biopsies were taken with a       cold forceps for Helicobacter pylori testing.      The gastroesophageal junction and examined esophagus were normal. Impression:            - Normal duodenal bulb and second portion of the                         duodenum.                        - 8 cm hiatal hernia with a few Cameron ulcers.                        - Erythematous mucosa in the gastric body. Biopsied.                        - Normal incisura and antrum. Biopsied.                        - Normal gastroesophageal junction and esophagus. Recommendation:        - Return patient to hospital ward for possible                         discharge same day.                        - Cardiac diet today.                        - Continue present medications.                        - Use Protonix (pantoprazole) 40 mg PO BID  indefinitely due to presence of large hiatal hernia.                        - Follow an antireflux regimen indefinitely. Procedure Code(s):     --- Professional ---                        [phone], Esophagogastroduodenoscopy, flexible,                         transoral; with biopsy, single or multiple Diagnosis Code(s):     --- Professional ---                        K44.9, Diaphragmatic hernia without obstruction or                         gangrene                        K25.9, Gastric ulcer, unspecified as acute or chronic,                         without hemorrhage or perforation                        K31.89, Other diseases of stomach and duodenum                        D50.0, Iron deficiency anemia secondary to blood loss                         (chronic)                         D50.9, Iron deficiency anemia, unspecified                        K92.0, Hematemesis                        K92.1, Melena (includes Hematochezia) CPT copyright 2019 American Medical Association. All rights reserved. The codes documented in this report are preliminary and upon coder review may  be revised to meet current compliance requirements. Dr. Ulyess Mort Lin Landsman MD, MD 02/25/2021 11:06:55 AM This report has been signed electronically. Number of Addenda: 0 Note Initiated On: 02/25/2021 10:41 AM Total Procedure Duration: 0 hours 6 minutes 47 seconds  Estimated Blood Loss:  Estimated blood loss: none.      [facility]

## 2021-02-25 NOTE — Transfer of Care (Signed)
Immediate Anesthesia Transfer of Care Note  Patient: Tina Hayes  Procedure(s) Performed: ESOPHAGOGASTRODUODENOSCOPY (EGD)  Patient Location: Endoscopy Unit  Anesthesia Type:General  Level of Consciousness: awake and alert   Airway & Oxygen Therapy: Patient Spontanous Breathing and Patient connected to nasal cannula oxygen  Post-op Assessment: Report given to RN and Post -op Vital signs reviewed and stable  Post vital signs: Reviewed and stable  Last Vitals:  Vitals Value Taken Time  BP 123/108 02/25/21 1111  Temp    Pulse 62 02/25/21 1111  Resp 28 02/25/21 1111  SpO2 100 % 02/25/21 1111  Vitals shown include unvalidated device data.  Last Pain:  Vitals:   02/25/21 1111  TempSrc:   PainSc: 0-No pain         Complications: No notable events documented.

## 2021-02-25 NOTE — Assessment & Plan Note (Signed)
S/p EGD showing Normal duodenal bulb and second portion of the duodenum.8 cm hiatal hernia with a few Cameron ulcers. Erythematous mucosa in the gastric body.  Continue PPI start cardiac diet

## 2021-02-25 NOTE — Anesthesia Postprocedure Evaluation (Signed)
Anesthesia Post Note  Patient: Tina Hayes  Procedure(s) Performed: ESOPHAGOGASTRODUODENOSCOPY (EGD)  Patient location during evaluation: Endoscopy Anesthesia Type: General Level of consciousness: awake and alert and oriented Pain management: pain level controlled Vital Signs Assessment: post-procedure vital signs reviewed and stable Respiratory status: spontaneous breathing, nonlabored ventilation and respiratory function stable Cardiovascular status: blood pressure returned to baseline and stable Postop Assessment: no signs of nausea or vomiting Anesthetic complications: no   No notable events documented.   Last Vitals:  Vitals:   02/25/21 1300 02/25/21 1354  BP:    Pulse:    Resp: 20 20  Temp:    SpO2:      Last Pain:  Vitals:   02/25/21 1354  TempSrc:   PainSc: 0-No pain                 Kipp Shank

## 2021-02-25 NOTE — Progress Notes (Signed)
  Progress Note    Tina Hayes   XTG:[id number]  DOB: Jul 21, 1945  DOA: 02/24/2021     1 Date of Service: 02/25/2021   Clinical Course  75 year old female with a 1 day history of melena and coffee-ground emesis admitted for same  10/31 -s/p EGD showing Normal duodenal bulb and second portion of the duodenum.8 cm hiatal hernia with a few Cameron ulcers. Erythematous mucosa in the gastric body.    Assessment and Plan * Anemia Acute on chronic blood loss anemia.  Likely GI related.  Transfused 1 PRBC on admission with hemoglobin of 6.7.  It is 9.2 today.  No further GI bleed noted  Cameron ulcer, acute Confirmed on EGD today continue PPI  Hiatal hernia Seen during EGD.  8 cm  Coffee ground emesis S/p EGD showing Normal duodenal bulb and second portion of the duodenum.8 cm hiatal hernia with a few Cameron ulcers. Erythematous mucosa in the gastric body.  Continue PPI   Melena S/p EGD showing Normal duodenal bulb and second portion of the duodenum.8 cm hiatal hernia with a few Cameron ulcers. Erythematous mucosa in the gastric body.  Continue PPI start cardiac diet   Abnormal EKG Likely due to severe anemia.  Does not seem cardiac ischemic changes  GERD (gastroesophageal reflux disease) Continue PPI  Coronary artery disease Continue Norvasc, Lipitor, Coreg, Imdur, losartan     Subjective:  Denies any complaints.  Agreeable to get EGD.  Hungry wants to eat  Objective Vitals:   02/25/21 1255 02/25/21 1300 02/25/21 1354 02/25/21 1424  BP:    (!) (P) 164/97  Pulse:      Resp: 20 20 20  (P) 20  Temp:      TempSrc:      SpO2:      Weight:      Height:       117.9 kg  Vital signs were reviewed and unremarkable except for: Blood pressure: Elevated    Exam Physical Exam   General appearance: alert, cooperative, appears stated age, fatigued, mild distress, and moderately obese Head: Normocephalic, without obvious abnormality, atraumatic Eyes:  conjunctivae/corneas clear. PERRL, EOM's intact. Fundi benign. Throat: lips, mucosa, and tongue normal; teeth and gums normal Neck: no adenopathy, no carotid bruit, no JVD, supple, symmetrical, trachea midline, and thyroid not enlarged, symmetric, no tenderness/mass/nodules Resp:  No increased work of breathing. No wheezes, rales, or rhonchi. No tactile fremitus. Chest wall: no tenderness Cardio: regular rate and rhythm, S1, S2 normal, no murmur, click, rub or gallop GI:  Soft, tender in epigastrum and suprapubic regions.  Unable to auscultate bowel sounds. Unable to evaluate the abdomen for hernias, masses, or organomegaly  Extremities: extremities normal, atraumatic, no cyanosis or edema Pulses: Positive for 2+ pitting edema bilaterally. Distal pulses present bilaterally. Skin: Skin color, texture, turgor normal. No rashes or lesions Lymph nodes: Cervical, supraclavicular, and axillary nodes normal. Neurologic: Alert and oriented X 3, normal strength and tone. Normal symmetric reflexes. Normal coordination and gait Incision/Wound: Non noted.  Labs / Other Information My review of labs, imaging, notes and other tests is significant for Low potassium, low hemoglobin and hematocrit.  A1c of 7.7     Disposition Plan: Status is: Inpatient  Remains inpatient appropriate because: Severe anemia, hypokalemia        Time spent: 35 minutes [facility] 02/25/2021, 2:55 PM

## 2021-02-25 NOTE — Assessment & Plan Note (Addendum)
Acute on chronic blood loss anemia.  Likely GI related.  Transfused 1 PRBC on admission with hemoglobin of 6.7.  It is 9.2 today.  No further GI bleed noted

## 2021-02-25 NOTE — Anesthesia Preprocedure Evaluation (Signed)
Anesthesia Evaluation  Patient identified by MRN, date of birth, ID band Patient awake    Reviewed: Allergy & Precautions, NPO status , Patient's Chart, lab work & pertinent test results  History of Anesthesia Complications Negative for: history of anesthetic complications  Airway Mallampati: II  TM Distance: >3 FB Neck ROM: Full    Dental  (+) Edentulous Upper, Edentulous Lower   Pulmonary COPD, former smoker,    breath sounds clear to auscultation- rhonchi (-) wheezing      Cardiovascular hypertension, + CAD, + Past MI, + CABG, + Peripheral Vascular Disease and +CHF   Rhythm:Regular Rate:Normal - Systolic murmurs and - Diastolic murmurs Echo 12/29/21: 1. Left ventricular ejection fraction, by estimation, is 55 to 60%. Left  ventricular ejection fraction by 3D volume is 58 %. The left ventricle has  normal function. The left ventricle has no regional wall motion  abnormalities. There is mild left  ventricular hypertrophy. Left ventricular diastolic parameters are  consistent with Grade I diastolic dysfunction (impaired relaxation).  2. Right ventricular systolic function is normal. The right ventricular  size is normal. There is normal pulmonary artery systolic pressure.  3. Left atrial size was mildly dilated.  4. The mitral valve is grossly normal. Mild mitral valve regurgitation.  5. Tricuspid valve regurgitation is mild to moderate.  6. The aortic valve is tricuspid. Aortic valve regurgitation is not  visualized. Mild to moderate aortic valve sclerosis/calcification is  present, without any evidence of aortic stenosis.  7. The inferior vena cava is normal in size with greater than 50%  respiratory variability, suggesting right atrial pressure of 3 mmHg.    Neuro/Psych Seizures -, Well Controlled,  PSYCHIATRIC DISORDERS Anxiety Depression    GI/Hepatic Neg liver ROS, GERD  ,  Endo/Other  diabetes, Insulin  Dependent  Renal/GU negative Renal ROS     Musculoskeletal  (+) Arthritis ,   Abdominal (+) + obese,   Peds  Hematology  (+) anemia ,   Anesthesia Other Findings Past Medical History: No date: (HFpEF) heart failure with preserved ejection fraction ([place])     Comment:  a. 05/2016 Echo: EF 60-65%, mild to mod LVH, Gr1 DD, mild              MR, mildly dil LA, mod TR, mildly to mod increased PASP. 55/10/3218: Acute diastolic heart failure ([place]) No date: Anxiety No date: Arthritis 06/16/2016: Chest pain No date: CHF (congestive heart failure) (HCC) No date: Chronic back pain 25/42/7062: Chronic diastolic congestive heart failure (HCC) No date: COPD (chronic obstructive pulmonary disease) (HCC) No date: Coronary artery disease     Comment:  a. s/p remote PCI x 5;  b. 2006 s/p CABG x 3               ([place], [place]); b. 05/2016               MV: attenuation corrected images w/o ischemia or               wma-->Med rx. 06/17/2016: Coronary artery disease of native artery of native heart  with stable angina pectoris ([place]) No date: Depression No date: Diabetes mellitus without complication ([place]) 07/02/6281: Essential hypertension No date: GERD (gastroesophageal reflux disease) No date: Heart attack ([place])     Comment:  Total of 3 per pt. No date: Hypertension 06/03/2015: Hypertensive urgency No date: Seizure [facility]) No date: Seizures ([place])   Reproductive/Obstetrics  Anesthesia Physical Anesthesia Plan  ASA: 3  Anesthesia Plan: General   Post-op Pain Management:    Induction: Intravenous  PONV Risk Score and Plan: 2 and Propofol infusion  Airway Management Planned: Natural Airway  Additional Equipment:   Intra-op Plan:   Post-operative Plan:   Informed Consent: I have reviewed the patients History and Physical, chart, labs and discussed the procedure including the risks, benefits and alternatives  for the proposed anesthesia with the patient or authorized representative who has indicated his/her understanding and acceptance.     Dental advisory given  Plan Discussed with: CRNA and Anesthesiologist  Anesthesia Plan Comments:         Anesthesia Quick Evaluation

## 2021-02-25 NOTE — Assessment & Plan Note (Signed)
Seen during EGD.  8 cm

## 2021-02-25 NOTE — Hospital Course (Signed)
75 year old female with a 1 day history of melena and coffee-ground emesis admitted for same  10/31 -s/p EGD showing Normal duodenal bulb and second portion of the duodenum.8 cm hiatal hernia with a few Cameron ulcers. Erythematous mucosa in the gastric body.

## 2021-02-25 NOTE — Assessment & Plan Note (Signed)
Continue PPI ?

## 2021-02-25 NOTE — ED Notes (Signed)
Called pharmacy for new bag of protonix.

## 2021-02-25 NOTE — Assessment & Plan Note (Signed)
S/p EGD showing Normal duodenal bulb and second portion of the duodenum.8 cm hiatal hernia with a few Cameron ulcers. Erythematous mucosa in the gastric body.  Continue PPI

## 2021-02-25 NOTE — Assessment & Plan Note (Signed)
Continue Norvasc, Lipitor, Coreg, Imdur, losartan

## 2021-02-25 NOTE — Assessment & Plan Note (Signed)
Confirmed on EGD today continue PPI

## 2021-02-25 NOTE — ED Notes (Addendum)
Unable to obtain lab due to venous insuffiencey - Yeni informed this RN that someone would be around to draw specimens

## 2021-02-26 ENCOUNTER — Encounter: Payer: Self-pay | Admitting: Gastroenterology

## 2021-02-26 DIAGNOSIS — R569 Unspecified convulsions: Secondary | ICD-10-CM | POA: Diagnosis not present

## 2021-02-26 DIAGNOSIS — K922 Gastrointestinal hemorrhage, unspecified: Secondary | ICD-10-CM | POA: Diagnosis not present

## 2021-02-26 DIAGNOSIS — G8929 Other chronic pain: Secondary | ICD-10-CM | POA: Diagnosis not present

## 2021-02-26 DIAGNOSIS — K219 Gastro-esophageal reflux disease without esophagitis: Secondary | ICD-10-CM | POA: Diagnosis not present

## 2021-02-26 DIAGNOSIS — Z833 Family history of diabetes mellitus: Secondary | ICD-10-CM | POA: Diagnosis not present

## 2021-02-26 DIAGNOSIS — K260 Acute duodenal ulcer with hemorrhage: Secondary | ICD-10-CM | POA: Diagnosis not present

## 2021-02-26 DIAGNOSIS — Z823 Family history of stroke: Secondary | ICD-10-CM | POA: Diagnosis not present

## 2021-02-26 DIAGNOSIS — J449 Chronic obstructive pulmonary disease, unspecified: Secondary | ICD-10-CM | POA: Diagnosis not present

## 2021-02-26 DIAGNOSIS — E1165 Type 2 diabetes mellitus with hyperglycemia: Secondary | ICD-10-CM | POA: Diagnosis not present

## 2021-02-26 DIAGNOSIS — D62 Acute posthemorrhagic anemia: Secondary | ICD-10-CM | POA: Diagnosis not present

## 2021-02-26 DIAGNOSIS — D509 Iron deficiency anemia, unspecified: Secondary | ICD-10-CM | POA: Diagnosis not present

## 2021-02-26 DIAGNOSIS — K449 Diaphragmatic hernia without obstruction or gangrene: Secondary | ICD-10-CM | POA: Diagnosis not present

## 2021-02-26 DIAGNOSIS — K921 Melena: Secondary | ICD-10-CM | POA: Diagnosis present

## 2021-02-26 DIAGNOSIS — F32A Depression, unspecified: Secondary | ICD-10-CM | POA: Diagnosis not present

## 2021-02-26 DIAGNOSIS — K920 Hematemesis: Secondary | ICD-10-CM | POA: Diagnosis not present

## 2021-02-26 DIAGNOSIS — I209 Angina pectoris, unspecified: Secondary | ICD-10-CM | POA: Diagnosis not present

## 2021-02-26 DIAGNOSIS — K250 Acute gastric ulcer with hemorrhage: Secondary | ICD-10-CM | POA: Diagnosis not present

## 2021-02-26 DIAGNOSIS — I110 Hypertensive heart disease with heart failure: Secondary | ICD-10-CM | POA: Diagnosis not present

## 2021-02-26 DIAGNOSIS — I252 Old myocardial infarction: Secondary | ICD-10-CM | POA: Diagnosis not present

## 2021-02-26 DIAGNOSIS — Z8249 Family history of ischemic heart disease and other diseases of the circulatory system: Secondary | ICD-10-CM | POA: Diagnosis not present

## 2021-02-26 DIAGNOSIS — Z87891 Personal history of nicotine dependence: Secondary | ICD-10-CM | POA: Diagnosis not present

## 2021-02-26 DIAGNOSIS — Z808 Family history of malignant neoplasm of other organs or systems: Secondary | ICD-10-CM | POA: Diagnosis not present

## 2021-02-26 DIAGNOSIS — Z818 Family history of other mental and behavioral disorders: Secondary | ICD-10-CM | POA: Diagnosis not present

## 2021-02-26 DIAGNOSIS — I5032 Chronic diastolic (congestive) heart failure: Secondary | ICD-10-CM | POA: Diagnosis not present

## 2021-02-26 DIAGNOSIS — R9431 Abnormal electrocardiogram [ECG] [EKG]: Secondary | ICD-10-CM | POA: Diagnosis not present

## 2021-02-26 DIAGNOSIS — D649 Anemia, unspecified: Secondary | ICD-10-CM | POA: Diagnosis not present

## 2021-02-26 DIAGNOSIS — Z20822 Contact with and (suspected) exposure to covid-19: Secondary | ICD-10-CM | POA: Diagnosis not present

## 2021-02-26 DIAGNOSIS — F419 Anxiety disorder, unspecified: Secondary | ICD-10-CM | POA: Diagnosis not present

## 2021-02-26 LAB — BASIC METABOLIC PANEL
Anion gap: 7 (ref 5–15)
BUN: 14 mg/dL (ref 8–23)
CO2: 26 mmol/L (ref 22–32)
Calcium: 8.3 mg/dL — ABNORMAL LOW (ref 8.9–10.3)
Chloride: 107 mmol/L (ref 98–111)
Creatinine, Ser: 0.79 mg/dL (ref 0.44–1.00)
GFR, Estimated: >60 mL/min (ref >60)
Glucose, Bld: 162 mg/dL — ABNORMAL HIGH (ref 70–99)
Potassium: 3.5 mmol/L (ref 3.5–5.1)
Sodium: 140 mmol/L (ref 135–145)

## 2021-02-26 LAB — CBC
HCT: 28.1 % — ABNORMAL LOW (ref 36.0–46.0)
Hemoglobin: 9.2 g/dL — ABNORMAL LOW (ref 12.0–15.0)
MCH: 27.2 pg (ref 26.0–34.0)
MCHC: 32.7 g/dL (ref 30.0–36.0)
MCV: 83.1 fL (ref 80.0–100.0)
Platelets: 235 10*3/uL (ref 150–400)
RBC: 3.38 MIL/uL — ABNORMAL LOW (ref 3.87–5.11)
RDW: 17.2 % — ABNORMAL HIGH (ref 11.5–15.5)
WBC: 8.5 10*3/uL (ref 4.0–10.5)
nRBC: 0 % (ref 0.0–0.2)

## 2021-02-26 LAB — GLUCOSE, CAPILLARY
Glucose-Capillary: 181 mg/dL — ABNORMAL HIGH (ref 70–99)
Glucose-Capillary: 192 mg/dL — ABNORMAL HIGH (ref 70–99)
Glucose-Capillary: 160 mg/dL — ABNORMAL HIGH (ref 70–99)

## 2021-02-26 LAB — VITAMIN B12: Vitamin B-12: 517 pg/mL (ref 180–914)

## 2021-02-26 LAB — SURGICAL PATHOLOGY

## 2021-02-26 LAB — HEMOGLOBIN AND HEMATOCRIT, BLOOD
HCT: 30.3 % — ABNORMAL LOW (ref 36.0–46.0)
Hemoglobin: 10 g/dL — ABNORMAL LOW (ref 12.0–15.0)

## 2021-02-26 MED ORDER — ISOSORBIDE MONONITRATE ER 30 MG PO TB24: 30.0000 mg | ORAL_TABLET | Freq: Every day | ORAL | 3 refills | Status: DC

## 2021-02-26 MED ORDER — PANTOPRAZOLE SODIUM 40 MG PO TBEC: 40.0000 mg | DELAYED_RELEASE_TABLET | Freq: Two times a day (BID) | ORAL | 0 refills | Status: DC

## 2021-02-28 NOTE — Discharge Summary (Addendum)
Physician Discharge Summary   Patient name: Tina Hayes  Admit date:     02/24/2021  Discharge date: 02/26/2021  Discharge Physician: Max Sane   PCP: Crissie Figures, PA-C   Recommendations at discharge: f/up with PCP and GI as requested  Discharge Diagnoses Principal Problem:   Anemia Active Problems:   COPD (chronic obstructive pulmonary disease) ([place])   Angina pectoris ([place])   Coronary artery disease   GERD (gastroesophageal reflux disease)   Acute upper GI bleed   Abnormal EKG   Melena   Coffee ground emesis   Hiatal hernia   Cameron ulcer, acute   Hospital Course   75 year old female with a 1 day history of melena and coffee-ground emesis admitted for same  10/31 -s/p EGD showing Normal duodenal bulb and second portion of the duodenum.8 cm hiatal hernia with a few Cameron ulcers. Erythematous mucosa in the gastric body.   Acute on chronic blood loss Anemia Due to GI bleed. Transfused PRBC. No further GI bleed noted while in the hospital   [facility] ulcer, acute Confirmed on EGD today continue PPI BID at D/C   Hiatal hernia Seen during EGD.  8 cm. Outpt GI f/up. PPI for now.    Coffee ground emesis S/p EGD showing Normal duodenal bulb and second portion of the duodenum.8 cm hiatal hernia with a few Cameron ulcers. Erythematous mucosa in the gastric body.  Continue PPI BID   Melena Present on admission and resolved at D/C S/p EGD showing Normal duodenal bulb and second portion of the duodenum.8 cm hiatal hernia with a few Cameron ulcers. Erythematous mucosa in the gastric body.  Continue PPI BID at DC and gi f/up   Abnormal EKG Likely due to severe anemia. No acute cardiac symptoms. outpt cardio f/up   GERD (gastroesophageal reflux disease) Continue PPI   Coronary artery disease Continue Norvasc, Lipitor, Coreg, Imdur, losartan        Procedures performed: EGD on 10/31   Condition at discharge: good  Exam Physical Exam   General appearance:  alert, cooperative, appears stated age, fatigued, mild distress, and moderately obese Head: Normocephalic, without obvious abnormality, atraumatic Eyes: conjunctivae/corneas clear. PERRL, EOM's intact. Fundi benign. Throat: lips, mucosa, and tongue normal; teeth and gums normal Neck: no adenopathy, no carotid bruit, no JVD, supple, symmetrical, trachea midline, and thyroid not enlarged, symmetric, no tenderness/mass/nodules Resp:  No increased work of breathing. No wheezes, rales, or rhonchi. No tactile fremitus. Chest wall: no tenderness Cardio: regular rate and rhythm, S1, S2 normal, no murmur, click, rub or gallop GI:  Soft, non tender non distended Extremities: extremities normal, atraumatic, no cyanosis or edema Pulses: Positive for trace pitting edema bilaterally. Distal pulses present bilaterally. Skin: Skin color, texture, turgor normal. No rashes or lesions Lymph nodes: Cervical, supraclavicular, and axillary nodes normal. Neurologic: Alert and oriented X 3, normal strength and tone. Normal symmetric reflexes. Normal coordination and gait Incision/Wound: Non noted.    Disposition: Home  Discharge time:greater than 30 minutes.  Follow-up Information     Crissie Figures, PA-C. Go on 02/28/2021.   Specialty: Physician Assistant Why: 9:40am appointment Contact information: [place] [address] [phone]         Minna Merritts, MD. Go on 03/15/2021.   Specialty: Cardiology Why: [facility] Discharge F/UP 8:50am appointment Contact information: [place] [address] [phone]         Lin Landsman, MD. Schedule an appointment as soon as possible  for a visit in 3 week(s).   Specialty: Gastroenterology Why: [facility] Discharge F/UP LEft a message for them to call patient with appointment Contact information: [place] [address] [phone]                 Allergies as  of 02/26/2021       Reactions   Aspirin Anaphylaxis, Shortness Of Breath, Swelling   LIPS AND THROAT SWELL, DIFFICULT TO BREATH        Medication List     STOP taking these medications    Dupixent 300 MG/2ML Sopn Generic drug: Dupilumab   esomeprazole 20 MG packet Commonly known as: NEXIUM   ferrous sulfate 325 (65 FE) MG tablet   meloxicam 7.5 MG tablet Commonly known as: MOBIC   montelukast 10 MG tablet Commonly known as: SINGULAIR   NovoLOG FlexPen 100 UNIT/ML FlexPen Generic drug: insulin aspart   predniSONE 50 MG tablet Commonly known as: DELTASONE   sertraline 100 MG tablet Commonly known as: ZOLOFT       TAKE these medications    acetaminophen 500 MG tablet Commonly known as: TYLENOL Take 500 mg by mouth every 6 (six) hours as needed for headache, moderate pain or fever.   albuterol (2.5 MG/3ML) 0.083% nebulizer solution Commonly known as: PROVENTIL Take 2.5 mg every 4 (four) hours as needed by nebulization for wheezing or shortness of breath.   albuterol 108 (90 Base) MCG/ACT inhaler Commonly known as: VENTOLIN HFA Inhale 2 puffs into the lungs every 6 (six) hours as needed for wheezing or shortness of breath.   amLODipine 5 MG tablet Commonly known as: NORVASC Take 1 tablet (5 mg total) by mouth daily.   atorvastatin 40 MG tablet Commonly known as: LIPITOR Take 1 tablet (40 mg total) by mouth daily.   B-D UF III MINI PEN NEEDLES 31G X 5 MM Misc Generic drug: Insulin Pen Needle USE WITH INSULIN PEN INJECTIONS TWICE DAILY   carvedilol 3.125 MG tablet Commonly known as: COREG Take 1 tablet (3.125 mg total) by mouth daily as needed.   clopidogrel 75 MG tablet Commonly known as: PLAVIX Take 1 tablet (75 mg total) by mouth daily.   HYDROcodone-acetaminophen 7.5-325 MG tablet Commonly known as: NORCO Take 1 tablet by mouth every 6 (six) hours as needed for moderate pain or severe pain. What changed: Another medication with the same name  was removed. Continue taking this medication, and follow the directions you see here.   Ipratropium-Albuterol 20-100 MCG/ACT Aers respimat Commonly known as: COMBIVENT Inhale into the lungs.   isosorbide mononitrate 30 MG 24 hr tablet Commonly known as: IMDUR Take 1 tablet (30 mg total) by mouth daily. What changed: when to take this   Lantus SoloStar 100 UNIT/ML Solostar Pen Generic drug: insulin glargine Inject 30-40 Units into the skin See admin instructions. Inject 30 units subcutaneously in the morning at 40 units subcutaneously at bedtime.   levETIRAcetam 750 MG tablet Commonly known as: KEPPRA Take 750 mg by mouth 2 (two) times daily. What changed: Another medication with the same name was removed. Continue taking this medication, and follow the directions you see here.   losartan 100 MG tablet Commonly known as: COZAAR Take 1 tablet (100 mg total) by mouth daily.   nitroGLYCERIN 0.4 MG SL tablet Commonly known as: NITROSTAT DISSOLVE ONE TABLET UNDER THE TONGUE EVERY 5 MINUTES AS NEEDED FOR CHEST PAIN.  DO NOT EXCEED A TOTAL OF 3 DOSES IN 15 MINUTES  ondansetron 4 MG tablet Commonly known as: ZOFRAN Take 4 mg by mouth as needed.   pantoprazole 40 MG tablet Commonly known as: PROTONIX Take 1 tablet (40 mg total) by mouth 2 (two) times daily. What changed: when to take this   potassium chloride SA 20 MEQ tablet Commonly known as: KLOR-CON Take 2 tablets (40 mEq total) by mouth 2 (two) times daily. Take extra 2 tablets (40 meq) if you take metolazone   tiotropium 18 MCG inhalation capsule Commonly known as: SPIRIVA Place 18 mcg into inhaler and inhale daily.   torsemide 20 MG tablet Commonly known as: DEMADEX Take 1 tablet (20 mg) by mouth twice daily   traZODone 50 MG tablet Commonly known as: DESYREL Take 50 mg by mouth at bedtime.        CT ABDOMEN PELVIS WO CONTRAST  Result Date: 02/24/2021 CLINICAL DATA:  Epigastric pain EXAM: CT ABDOMEN AND  PELVIS WITHOUT CONTRAST TECHNIQUE: Multidetector CT imaging of the abdomen and pelvis was performed following the standard protocol without IV contrast. COMPARISON:  None. FINDINGS: Lower chest: No significant abnormality is seen. Hepatobiliary: Liver measures 14.6 cm. No focal abnormality is seen in the liver. There is 2.7 cm increased density in the lumen of gallbladder. There is no wall thickening. There is no dilation of bile ducts. Pancreas: No focal abnormality is seen. Spleen: Unremarkable Adrenals/Urinary Tract: Adrenals are unremarkable. There is no hydronephrosis. There are no renal or ureteral stones. Urinary bladder is unremarkable. Stomach/Bowel: Moderate to large sized fixed hiatal hernia is seen. Small bowel loops are not dilated. Appendix is not dilated. High density in the lumen of appendix may suggest oral medication or residual oral contrast from previous examinations. There is no pericecal inflammation. Multiple diverticula are seen in colon without signs of focal acute diverticulitis. Vascular/Lymphatic: There are scattered arterial calcifications including the coronary arteries. There is no significant lymphadenopathy. Reproductive: Uterus is not seen. There are no dominant adnexal masses. Other: There is no ascites or pneumoperitoneum. Umbilical hernia containing fat is seen. Musculoskeletal: Degenerative changes are noted in lumbar spine and lower thoracic spine with spinal stenosis and encroachment of neural foramina at multiple levels. There is mixed density lesion with sclerotic margins in the body L5 vertebra, possibly a chronic process. IMPRESSION: There is no evidence of intestinal obstruction or pneumoperitoneum. There is no hydronephrosis. Appendix is not dilated. Gallbladder stone. Diverticulosis of colon. Moderate to large fixed hiatal hernia. Coronary artery calcifications are seen. Other findings as described in the body of the report. Electronically Signed   By: Elmer Picker M.D.   On: 02/24/2021 14:07   DG Chest 1 View  Result Date: 02/11/2021 CLINICAL DATA:  Cough EXAM: CHEST  1 VIEW COMPARISON:  Chest x-ray dated September 30, 2019 FINDINGS: Unchanged cardiomegaly. Tortuosity of the thoracic aorta. Prior median sternotomy and CABG. Moderate hiatal hernia. Mild bibasilar atelectasis. No focal consolidation. No large pleural effusion evidence of pneumothorax. IMPRESSION: Mildly atelectasis, lungs otherwise clear. Electronically Signed   By: Yetta Glassman M.D.   On: 02/11/2021 15:43   DG Chest 2 View  Result Date: 02/24/2021 CLINICAL DATA:  Weakness and hematemesis EXAM: CHEST - 2 VIEW COMPARISON:  02/11/2021 FINDINGS: Normal heart size and mediastinal contours. CABG. Sizable hiatal hernia with similar degree of gaseous distension. Coronary atherosclerosis. There is no edema, consolidation, effusion, or pneumothorax. IMPRESSION: Stable from prior.  No evidence of active disease. Electronically Signed   By: Jorje Guild M.D.   On: 02/24/2021 11:27  Results for orders placed or performed during the hospital encounter of 02/24/21  Resp Panel by RT-PCR (Flu A&B, Covid) Nasopharyngeal Swab     Status: None   Collection Time: 02/24/21  1:50 PM   Specimen: Nasopharyngeal Swab; Nasopharyngeal(NP) swabs in vial transport medium  Result Value Ref Range Status   SARS Coronavirus 2 by RT PCR NEGATIVE NEGATIVE Final    Comment: (NOTE) SARS-CoV-2 target nucleic acids are NOT DETECTED.  The SARS-CoV-2 RNA is generally detectable in upper respiratory specimens during the acute phase of infection. The lowest concentration of SARS-CoV-2 viral copies this assay can detect is 138 copies/mL. A negative result does not preclude SARS-Cov-2 infection and should not be used as the sole basis for treatment or other patient management decisions. A negative result may occur with  improper specimen collection/handling, submission of specimen other than nasopharyngeal swab,  presence of viral mutation(s) within the areas targeted by this assay, and inadequate number of viral copies(<138 copies/mL). A negative result must be combined with clinical observations, patient history, and epidemiological information. The expected result is Negative.  Fact Sheet for Patients:  EntrepreneurPulse.com.au  Fact Sheet for Healthcare Providers:  IncredibleEmployment.be  This test is no t yet approved or cleared by the [place] FDA and  has been authorized for detection and/or diagnosis of SARS-CoV-2 by FDA under an Emergency Use Authorization (EUA). This EUA will remain  in effect (meaning this test can be used) for the duration of the COVID-19 declaration under Section 564(b)(1) of the Act, 21 U.S.C.section 360bbb-3(b)(1), unless the authorization is terminated  or revoked sooner.       Influenza A by PCR NEGATIVE NEGATIVE Final   Influenza B by PCR NEGATIVE NEGATIVE Final    Comment: (NOTE) The Xpert Xpress SARS-CoV-2/FLU/RSV plus assay is intended as an aid in the diagnosis of influenza from Nasopharyngeal swab specimens and should not be used as a sole basis for treatment. Nasal washings and aspirates are unacceptable for Xpert Xpress SARS-CoV-2/FLU/RSV testing.  Fact Sheet for Patients: EntrepreneurPulse.com.au  Fact Sheet for Healthcare Providers: IncredibleEmployment.be  This test is not yet approved or cleared by the [place] FDA and has been authorized for detection and/or diagnosis of SARS-CoV-2 by FDA under an Emergency Use Authorization (EUA). This EUA will remain in effect (meaning this test can be used) for the duration of the COVID-19 declaration under Section 564(b)(1) of the Act, 21 U.S.C. section 360bbb-3(b)(1), unless the authorization is terminated or revoked.  Performed at [facility], [address], [place], [place] [address]      Signed:  Max Sane MD.  [facility] 02/28/2021, 2:43 PM

## 2021-03-04 ENCOUNTER — Other Ambulatory Visit: Payer: Self-pay

## 2021-03-04 ENCOUNTER — Ambulatory Visit: Payer: Medicare HMO | Attending: Anesthesiology | Admitting: Anesthesiology

## 2021-03-04 ENCOUNTER — Encounter: Payer: Self-pay | Admitting: Anesthesiology

## 2021-03-04 DIAGNOSIS — M5432 Sciatica, left side: Secondary | ICD-10-CM

## 2021-03-04 DIAGNOSIS — M47817 Spondylosis without myelopathy or radiculopathy, lumbosacral region: Secondary | ICD-10-CM

## 2021-03-04 DIAGNOSIS — M5431 Sciatica, right side: Secondary | ICD-10-CM | POA: Diagnosis not present

## 2021-03-04 DIAGNOSIS — M5136 Other intervertebral disc degeneration, lumbar region: Secondary | ICD-10-CM

## 2021-03-04 DIAGNOSIS — F1190 Opioid use, unspecified, uncomplicated: Secondary | ICD-10-CM

## 2021-03-04 DIAGNOSIS — M48062 Spinal stenosis, lumbar region with neurogenic claudication: Secondary | ICD-10-CM

## 2021-03-04 DIAGNOSIS — M4716 Other spondylosis with myelopathy, lumbar region: Secondary | ICD-10-CM | POA: Diagnosis not present

## 2021-03-04 DIAGNOSIS — G894 Chronic pain syndrome: Secondary | ICD-10-CM

## 2021-03-04 MED ORDER — HYDROCODONE-ACETAMINOPHEN 7.5-325 MG PO TABS: 1.0000 | ORAL_TABLET | Freq: Four times a day (QID) | ORAL | 0 refills | Status: DC | PRN

## 2021-03-04 NOTE — Progress Notes (Signed)
Virtual Visit via Telephone Note  I connected with Tina Hayes on 03/04/21 at 10:00 AM EST by telephone and verified that I am speaking with the correct person using two identifiers.  Location: Patient: Home Provider: Pain control center   I discussed the limitations, risks, security and privacy concerns of performing an evaluation and management service by telephone and the availability of in person appointments. I also discussed with the patient that there may be a patient responsible charge related to this service. The patient expressed understanding and agreed to proceed.   History of Present Illness: I spoke with Tina Hayes today via telephone as she was unable to do the video portion of the virtual conference but she reports that her back pain has been doing well following her recent epidural.  She is no longer having much of the sciatica symptoms.  This has diminished in severity and presents only with prolonged standing.  It is much better than it was before and her back pain has eased up as well.  She still taking her medications for pain relief and reports that those are working well.  No side effects are noted.  She continues to sleep better at night at this point and is having better pain management throughout the day with her current combination.  She does not feel that she needs to set up another epidural at this time.  She is working on stretching strengthening exercises as tolerated.  She has been unable to lose any weight.  Otherwise she is in her usual state of health but she was recently in the hospital secondary to an upper GI problem with some associated anemia.  They found out that she had a gastric ulcer.  She has been off the Plavix and is not taking anti-inflammatories at this point.  Otherwise she seems to be stable.  She is continue to follow-up with as Dr. She reports.  Review of systems: General: No fevers or chills Pulmonary: No shortness of breath or  dyspnea Cardiac: No angina or palpitations or lightheadedness GI: No abdominal pain or constipation Psych: No depression    Observations/Objective:   Current Outpatient Medications:    [START ON 04/03/2021] HYDROcodone-acetaminophen (NORCO) 7.5-325 MG tablet, Take 1 tablet by mouth every 6 (six) hours as needed for moderate pain or severe pain., Disp: 120 tablet, Rfl: 0   acetaminophen (TYLENOL) 500 MG tablet, Take 500 mg by mouth every 6 (six) hours as needed for headache, moderate pain or fever., Disp: , Rfl:    albuterol (PROVENTIL HFA;VENTOLIN HFA) 108 (90 Base) MCG/ACT inhaler, Inhale 2 puffs into the lungs every 6 (six) hours as needed for wheezing or shortness of breath., Disp: 1 Inhaler, Rfl: 2   albuterol (PROVENTIL) (2.5 MG/3ML) 0.083% nebulizer solution, Take 2.5 mg every 4 (four) hours as needed by nebulization for wheezing or shortness of breath., Disp: , Rfl:    amLODipine (NORVASC) 5 MG tablet, Take 1 tablet (5 mg total) by mouth daily., Disp: 30 tablet, Rfl: 6   atorvastatin (LIPITOR) 40 MG tablet, Take 1 tablet (40 mg total) by mouth daily., Disp: 90 tablet, Rfl: 1   B-D UF III MINI PEN NEEDLES 31G X 5 MM MISC, USE WITH INSULIN PEN INJECTIONS TWICE DAILY, Disp: , Rfl:    carvedilol (COREG) 3.125 MG tablet, Take 1 tablet (3.125 mg total) by mouth daily as needed., Disp: 90 tablet, Rfl: 3   clopidogrel (PLAVIX) 75 MG tablet, Take 1 tablet (75 mg total) by mouth daily.,  Disp: 90 tablet, Rfl: 3   Ipratropium-Albuterol (COMBIVENT) 20-100 MCG/ACT AERS respimat, Inhale into the lungs., Disp: , Rfl:    isosorbide mononitrate (IMDUR) 30 MG 24 hr tablet, Take 1 tablet (30 mg total) by mouth daily., Disp: 180 tablet, Rfl: 3   LANTUS SOLOSTAR 100 UNIT/ML Solostar Pen, Inject 30-40 Units into the skin See admin instructions. Inject 30 units subcutaneously in the morning at 40 units subcutaneously at bedtime., Disp: , Rfl:    levETIRAcetam (KEPPRA) 750 MG tablet, Take 750 mg by mouth 2 (two)  times daily., Disp: , Rfl:    losartan (COZAAR) 100 MG tablet, Take 1 tablet (100 mg total) by mouth daily., Disp: 90 tablet, Rfl: 3   nitroGLYCERIN (NITROSTAT) 0.4 MG SL tablet, DISSOLVE ONE TABLET UNDER THE TONGUE EVERY 5 MINUTES AS NEEDED FOR CHEST PAIN.  DO NOT EXCEED A TOTAL OF 3 DOSES IN 15 MINUTES, Disp: 25 tablet, Rfl: 0   ondansetron (ZOFRAN) 4 MG tablet, Take 4 mg by mouth as needed., Disp: , Rfl:    pantoprazole (PROTONIX) 40 MG tablet, Take 1 tablet (40 mg total) by mouth 2 (two) times daily., Disp: 60 tablet, Rfl: 0   potassium chloride SA (KLOR-CON) 20 MEQ tablet, Take 2 tablets (40 mEq total) by mouth 2 (two) times daily. Take extra 2 tablets (40 meq) if you take metolazone, Disp: 140 tablet, Rfl: 5   tiotropium (SPIRIVA) 18 MCG inhalation capsule, Place 18 mcg into inhaler and inhale daily., Disp: , Rfl:    torsemide (DEMADEX) 20 MG tablet, Take 1 tablet (20 mg) by mouth twice daily, Disp: , Rfl:    traZODone (DESYREL) 50 MG tablet, Take 50 mg by mouth at bedtime., Disp: , Rfl:   Assessment and Plan: 1. Bilateral sciatica   2. Lumbar spondylosis with myelopathy   3. DDD (degenerative disc disease), lumbar   4. Chronic, continuous use of opioids   5. Chronic pain syndrome   6. Facet arthritis of lumbosacral region   7. Morbid obesity ([place])   8. Spinal stenosis of lumbar region with neurogenic claudication   I have reviewed the [facility] practitioner database information and it is appropriate for refill.  These will be dated for the next 2 days which is already outstanding and for December 7.  We will schedule her for a 6-week follow-up return.  No other changes in her pain management protocol will be initiated.  I encouraged her to continue follow-up with her GI doctors for her recent gastric ulcer diagnosis.  Continue follow-up with her primary care physicians as well  Follow Up Instructions:    I discussed the assessment and treatment plan with the patient. The patient  was provided an opportunity to ask questions and all were answered. The patient agreed with the plan and demonstrated an understanding of the instructions.   The patient was advised to call back or seek an in-person evaluation if the symptoms worsen or if the condition fails to improve as anticipated.  I provided 30 minutes of non-face-to-face time during this encounter.   Molli Barrows, MD

## 2021-03-05 ENCOUNTER — Other Ambulatory Visit: Payer: Self-pay | Admitting: Physician Assistant

## 2021-03-05 DIAGNOSIS — Z1231 Encounter for screening mammogram for malignant neoplasm of breast: Secondary | ICD-10-CM

## 2021-03-15 ENCOUNTER — Ambulatory Visit: Payer: Medicare HMO | Admitting: Nurse Practitioner

## 2021-03-18 ENCOUNTER — Other Ambulatory Visit: Payer: Self-pay | Admitting: Cardiovascular Disease

## 2021-03-20 LAB — TOXASSURE SELECT 13 (MW), URINE

## 2021-03-25 IMAGING — DX DG CHEST 1V PORT
1 series · 1 of 1 positions shown · non-contrast
Comparison: May 25, 2018

CLINICAL DATA: Shortness of breath

EXAM:
PORTABLE CHEST 1 VIEW

[chest ap]
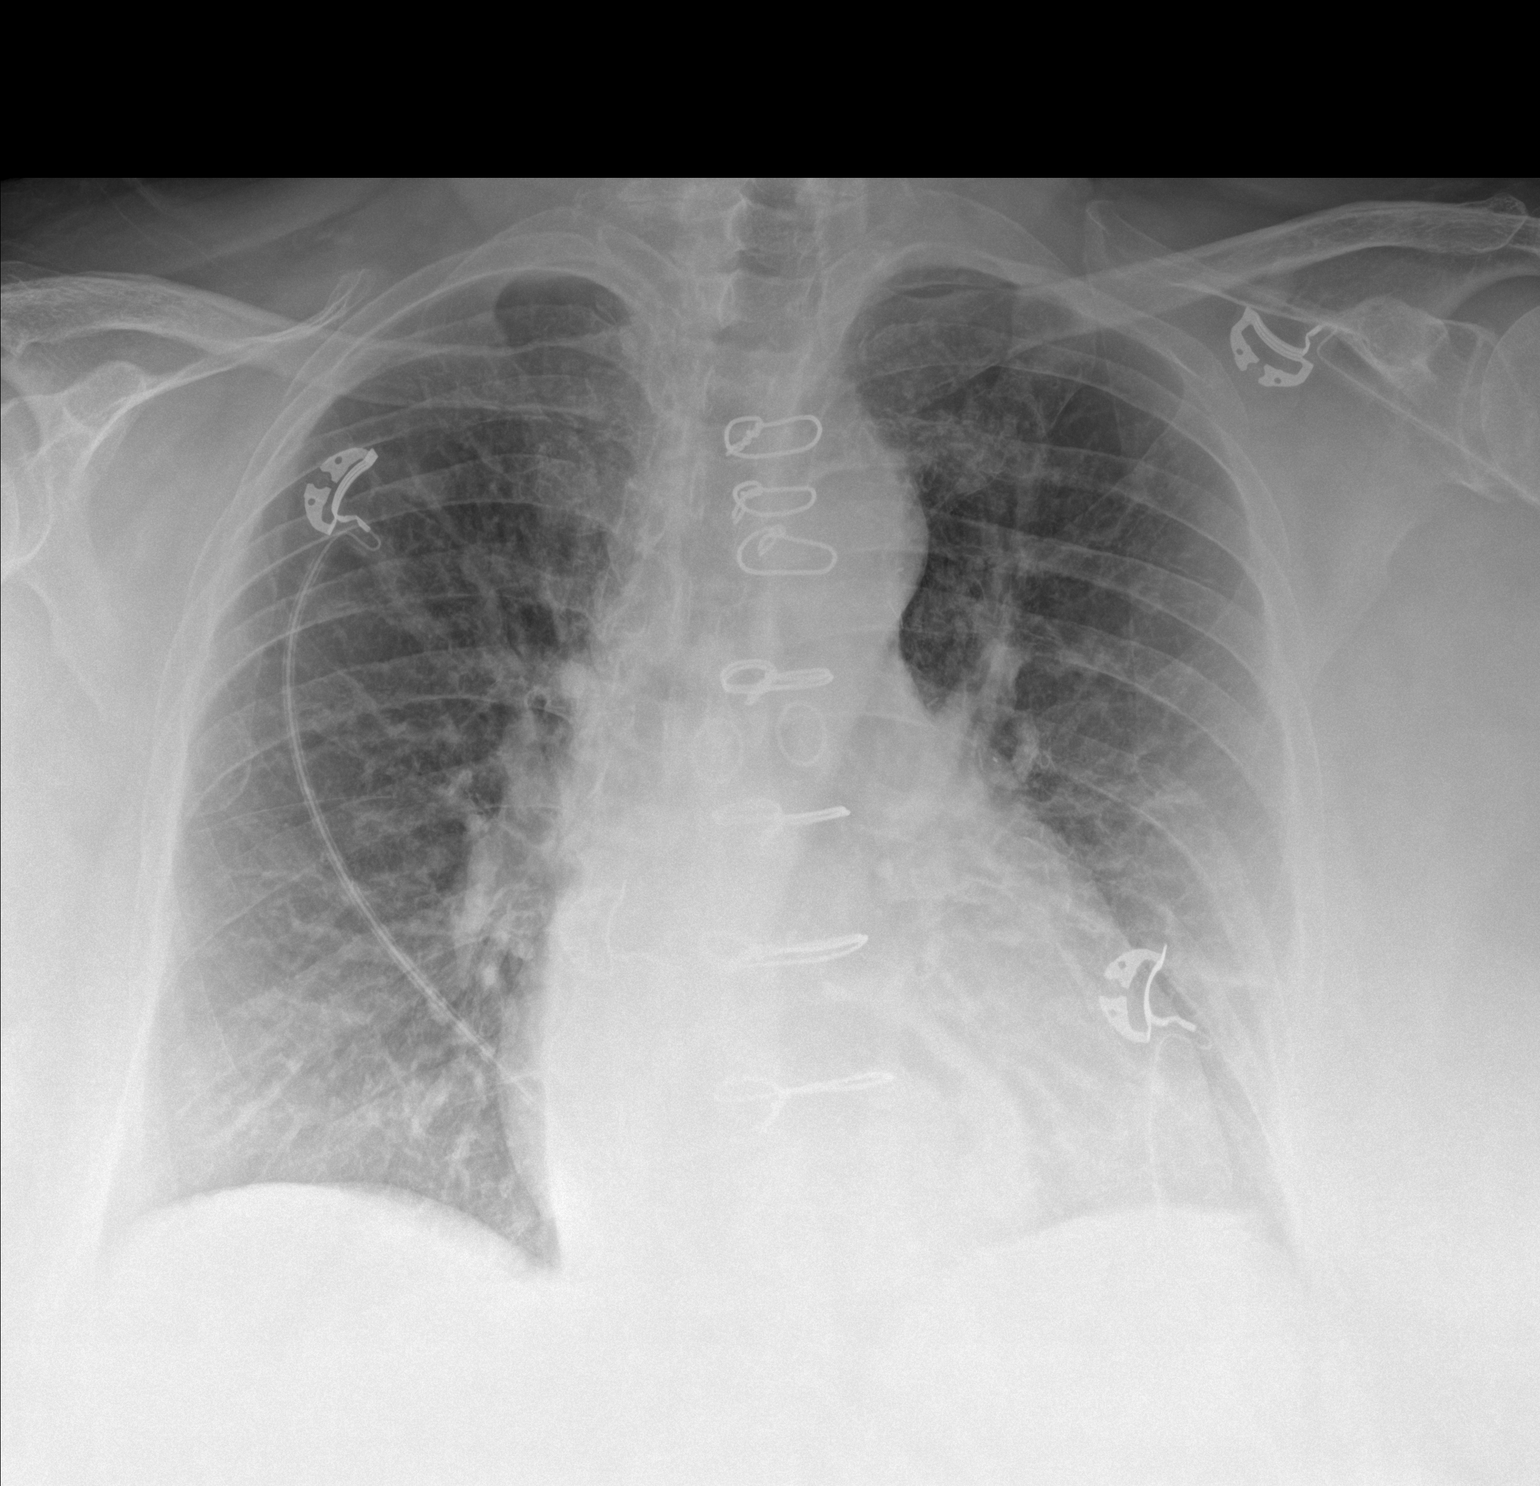

[1 of 1 positions shown; findings below may reference images not displayed]

FINDINGS: There is mild scarring in the left base. Lungs elsewhere are clear.
Heart is borderline enlarged with pulmonary vascularity normal. No
adenopathy. There is a focal hiatal hernia. No bone lesions.
IMPRESSION: Scarring left base. No edema or consolidation. Borderline cardiac
enlargement. Focal hiatal hernia. Status post coronary artery bypass
grafting.

## 2021-04-01 ENCOUNTER — Ambulatory Visit: Payer: Medicare HMO | Admitting: Gastroenterology

## 2021-04-03 ENCOUNTER — Encounter: Payer: Self-pay | Admitting: Nurse Practitioner

## 2021-04-03 ENCOUNTER — Ambulatory Visit: Payer: Medicare HMO | Admitting: Nurse Practitioner

## 2021-04-03 NOTE — Progress Notes (Deleted)
Office Visit    Patient Name: Tina Hayes Date of Encounter: 04/03/2021  Primary Care Provider:  Crissie Figures, PA-C Primary Cardiologist:  Ida Rogue, MD  Chief Complaint    75 year old female with a history of CAD status post prior coronary artery bypass grafting, HFpEF, diabetes, peripheral arterial disease, hypertension, hyperlipidemia, COPD, tobacco abuse, and seizure disorder, presents for follow-up of ***  Past Medical History    Past Medical History:  Diagnosis Date   (HFpEF) heart failure with preserved ejection fraction ([name])    a. 05/2016 Echo: EF 60-65%, mild to mod LVH, Gr1 DD, mild MR, mildly dil LA, mod TR, mildly to mod increased PASP; b. 07/2020 Echo: EF 55-60%, no rwma, mild LVH, GrIDD, mildly dil LA, mild MR, mild-mod TR, mild-mod AoV sclerosis w/o stenosis.   Anxiety    Arthritis    Chronic back pain    COPD (chronic obstructive pulmonary disease) (HCC)    Coronary artery disease    a. s/p remote PCI x 5;  b. 2006 s/p CABG x 3 ([place], [place]); b. 05/2016 MV: attenuation corrected images w/o ischemia or wma-->Med rx; c. 06/2020 MV: EF 55-65%, no isch/infart-->low risk.   Depression    Diabetes mellitus without complication ([name])    Essential hypertension 06/30/2016   GERD (gastroesophageal reflux disease)    Heart attack ([name])    Total of 3 per pt.   Hyperlipidemia LDL goal <55    Hypertensive urgency 06/03/2015   PAD (peripheral artery disease) ([name])    a. 06/2019 Angio: R AT (PTA), R POP (PTA & Viabahn stenting); b. 09/2020 ABIs: R 1.24, L 1.35.   Palpitations    a. 06/2020 Zio: Sinus brady, 54 (40-143), 4 beats NSVT, 3 SVT runs (max 143 bpm x 4 beats).   Seizure [facility])    Past Surgical History:  Procedure Laterality Date   ABDOMINAL HYSTERECTOMY     ANKLE SURGERY Right    CATARACT EXTRACTION W/ INTRAOCULAR LENS  IMPLANT, BILATERAL     COLONOSCOPY WITH PROPOFOL N/A 05/04/2017   Procedure: COLONOSCOPY WITH PROPOFOL;   Surgeon: [name], MD;  Location: [facility] ENDOSCOPY;  Service: Endoscopy;  Laterality: N/A;   CORONARY ANGIOPLASTY     CORONARY ARTERY BYPASS GRAFT     TRIPLE BYPASS   ESOPHAGOGASTRODUODENOSCOPY N/A 02/25/2021   Procedure: ESOPHAGOGASTRODUODENOSCOPY (EGD);  Surgeon: Lin Landsman, MD;  Location: [facility] ENDOSCOPY;  Service: Gastroenterology;  Laterality: N/A;   ESOPHAGOGASTRODUODENOSCOPY (EGD) WITH PROPOFOL N/A 05/04/2017   Procedure: ESOPHAGOGASTRODUODENOSCOPY (EGD) WITH PROPOFOL;  Surgeon: [name], MD;  Location: [facility] ENDOSCOPY;  Service: Endoscopy;  Laterality: N/A;   EXCISION MASS LOWER EXTREMETIES Right 03/02/2018   Procedure: EXCISION SOFT TISSUE MASS FROM MEDIAL ASPECT OF RIGHT ANKLE;  Surgeon: [name], MD;  Location: [facility] ORS;  Service: Orthopedics;  Laterality: Right;   FRACTURE SURGERY     JOINT REPLACEMENT     right   KNEE SURGERY Right    LOWER EXTREMITY ANGIOGRAPHY Right 07/14/2019   Procedure: LOWER EXTREMITY ANGIOGRAPHY;  Surgeon: Algernon Huxley, MD;  Location: [place] CV LAB;  Service: Cardiovascular;  Laterality: Right;   MOUTH SURGERY Left 07/14/2017   TOTAL KNEE ARTHROPLASTY Left 09/27/2019   Procedure: TOTAL KNEE ARTHROPLASTY;  Surgeon: [name], MD;  Location: [facility] ORS;  Service: Orthopedics;  Laterality: Left;    Allergies  Allergies  Allergen Reactions   Aspirin Anaphylaxis, Shortness Of Breath and Swelling    LIPS  AND THROAT SWELL, DIFFICULT TO BREATH    History of Present Illness    75 year old female with the above past medical history including coronary artery disease, HFpEF, diabetes, peripheral arterial disease, hypertension, hyperlipidemia, COPD, tobacco abuse, and seizure disorder.  He subsequently underwent three-vessel bypass in 2006 in [place].  He was admitted for chest pain in February 2018 underwent stress testing, which was low risk.  In the setting of claudication, in March 2021, he underwent right lower  extremity angioplasty and stenting as outlined in the past medical history.  Earlier this year, he underwent repeat stress testing due to dyspnea and low energy.  EF was 55 to 65% without ischemia or infarct.  This was followed by echocardiogram which again showed an EF of 55 to 60%, mild LVH, grade 1 diastolic dysfunction, mild to moderate TR, and aortic sclerosis without stenosis.  ABIs in June 2022 were normal.  Mr. [name] was last seen in cardiology clinic on August 30, at which time he reported atypical chest discomfort.  His stress test was reviewed and moderate hiatal hernia was noted on CT attenuation correction images.  He was referred to GI.  Unfortunately, Mr. [name] required admission in late October for melena and coffee-ground emesis.  EGD showed an 8 cm hiatal hernia with a few Cameron ulcers and erythematous mucosa in the gastric body.  He was placed on PPI therapy.  He did require packed red blood cells during admission.  Despite this, troponins were normal.  Home Medications    Current Outpatient Medications  Medication Sig Dispense Refill   acetaminophen (TYLENOL) 500 MG tablet Take 500 mg by mouth every 6 (six) hours as needed for headache, moderate pain or fever.     albuterol (PROVENTIL HFA;VENTOLIN HFA) 108 (90 Base) MCG/ACT inhaler Inhale 2 puffs into the lungs every 6 (six) hours as needed for wheezing or shortness of breath. 1 Inhaler 2   albuterol (PROVENTIL) (2.5 MG/3ML) 0.083% nebulizer solution Take 2.5 mg every 4 (four) hours as needed by nebulization for wheezing or shortness of breath.     amLODipine (NORVASC) 5 MG tablet Take 1 tablet (5 mg total) by mouth daily. 30 tablet 6   atorvastatin (LIPITOR) 40 MG tablet Take 1 tablet (40 mg total) by mouth daily. 90 tablet 1   B-D UF III MINI PEN NEEDLES 31G X 5 MM MISC USE WITH INSULIN PEN INJECTIONS TWICE DAILY     carvedilol (COREG) 3.125 MG tablet Take 1 tablet (3.125 mg total) by mouth daily as needed. 90 tablet 3    clopidogrel (PLAVIX) 75 MG tablet Take 1 tablet (75 mg total) by mouth daily. 90 tablet 3   HYDROcodone-acetaminophen (NORCO) 7.5-325 MG tablet Take 1 tablet by mouth every 6 (six) hours as needed for moderate pain or severe pain. 120 tablet 0   Ipratropium-Albuterol (COMBIVENT) 20-100 MCG/ACT AERS respimat Inhale into the lungs.     isosorbide mononitrate (IMDUR) 30 MG 24 hr tablet Take 1 tablet (30 mg total) by mouth daily. 180 tablet 3   LANTUS SOLOSTAR 100 UNIT/ML Solostar Pen Inject 30-40 Units into the skin See admin instructions. Inject 30 units subcutaneously in the morning at 40 units subcutaneously at bedtime.     levETIRAcetam (KEPPRA) 750 MG tablet Take 750 mg by mouth 2 (two) times daily.     losartan (COZAAR) 100 MG tablet Take 1 tablet (100 mg total) by mouth daily. 90 tablet 3   nitroGLYCERIN (NITROSTAT) 0.4 MG SL tablet DISSOLVE ONE TABLET  UNDER THE TONGUE EVERY 5 MINUTES AS NEEDED FOR CHEST PAIN.  DO NOT EXCEED A TOTAL OF 3 DOSES IN 15 MINUTES 25 tablet 0   ondansetron (ZOFRAN) 4 MG tablet Take 4 mg by mouth as needed.     pantoprazole (PROTONIX) 40 MG tablet Take 1 tablet (40 mg total) by mouth 2 (two) times daily. 60 tablet 0   potassium chloride SA (KLOR-CON) 20 MEQ tablet Take 2 tablets (40 mEq total) by mouth 2 (two) times daily. Take extra 2 tablets (40 meq) if you take metolazone 140 tablet 5   tiotropium (SPIRIVA) 18 MCG inhalation capsule Place 18 mcg into inhaler and inhale daily.     torsemide (DEMADEX) 20 MG tablet Take 1 tablet (20 mg) by mouth twice daily     traZODone (DESYREL) 50 MG tablet Take 50 mg by mouth at bedtime.     No current facility-administered medications for this visit.     Review of Systems    ***.  All other systems reviewed and are otherwise negative except as noted above.    Physical Exam    VS:  There were no vitals taken for this visit. , BMI There is no height or weight on file to calculate BMI.     GEN: Well nourished, well  developed, in no acute distress. HEENT: normal. Neck: Supple, no JVD, carotid bruits, or masses. Cardiac: RRR, no murmurs, rubs, or gallops. No clubbing, cyanosis, edema.  Radials/DP/PT 2+ and equal bilaterally.  Respiratory:  Respirations regular and unlabored, clear to auscultation bilaterally. GI: Soft, nontender, nondistended, BS + x 4. MS: no deformity or atrophy. Skin: warm and dry, no rash. Neuro:  Strength and sensation are intact. Psych: Normal affect.  Accessory Clinical Findings    ECG personally reviewed by me today - *** - no acute changes.  Lab Results  Component Value Date   WBC 8.5 02/26/2021   HGB 10.0 (L) 02/26/2021   HCT 30.3 (L) 02/26/2021   MCV 83.1 02/26/2021   PLT 235 02/26/2021   Lab Results  Component Value Date   CREATININE 0.79 02/26/2021   BUN 14 02/26/2021   NA 140 02/26/2021   K 3.5 02/26/2021   CL 107 02/26/2021   CO2 26 02/26/2021   Lab Results  Component Value Date   ALT 12 02/25/2021   AST 15 02/25/2021   ALKPHOS 71 02/25/2021   BILITOT 0.6 02/25/2021   Lab Results  Component Value Date   CHOL 120 01/30/2017   HDL 47 01/30/2017   LDLCALC 58 01/30/2017   TRIG 73 01/30/2017   CHOLHDL 2.6 01/30/2017    Lab Results  Component Value Date   HGBA1C 7.7 (H) 02/24/2021    Assessment & Plan    1.  ***   Murray Hodgkins, NP 04/03/2021, 1:50 PM

## 2021-04-08 ENCOUNTER — Ambulatory Visit: Payer: Medicare HMO | Admitting: Gastroenterology

## 2021-05-01 ENCOUNTER — Other Ambulatory Visit: Payer: Self-pay

## 2021-05-01 ENCOUNTER — Encounter: Payer: Self-pay | Admitting: Anesthesiology

## 2021-05-01 ENCOUNTER — Ambulatory Visit: Payer: Medicare HMO | Attending: Anesthesiology | Admitting: Anesthesiology

## 2021-05-01 DIAGNOSIS — M4716 Other spondylosis with myelopathy, lumbar region: Secondary | ICD-10-CM

## 2021-05-01 DIAGNOSIS — F1190 Opioid use, unspecified, uncomplicated: Secondary | ICD-10-CM

## 2021-05-01 DIAGNOSIS — M47817 Spondylosis without myelopathy or radiculopathy, lumbosacral region: Secondary | ICD-10-CM

## 2021-05-01 DIAGNOSIS — M48062 Spinal stenosis, lumbar region with neurogenic claudication: Secondary | ICD-10-CM

## 2021-05-01 DIAGNOSIS — M5136 Other intervertebral disc degeneration, lumbar region: Secondary | ICD-10-CM | POA: Diagnosis not present

## 2021-05-01 DIAGNOSIS — M5431 Sciatica, right side: Secondary | ICD-10-CM

## 2021-05-01 DIAGNOSIS — G894 Chronic pain syndrome: Secondary | ICD-10-CM

## 2021-05-01 DIAGNOSIS — M5432 Sciatica, left side: Secondary | ICD-10-CM

## 2021-05-01 DIAGNOSIS — M170 Bilateral primary osteoarthritis of knee: Secondary | ICD-10-CM

## 2021-05-01 MED ORDER — HYDROCODONE-ACETAMINOPHEN 7.5-325 MG PO TABS: 1.0000 | ORAL_TABLET | Freq: Four times a day (QID) | ORAL | 0 refills | Status: DC | PRN

## 2021-05-01 MED ORDER — HYDROCODONE-ACETAMINOPHEN 7.5-325 MG PO TABS: 1.0000 | ORAL_TABLET | Freq: Four times a day (QID) | ORAL | 0 refills | Status: AC | PRN

## 2021-05-01 NOTE — Progress Notes (Signed)
Virtual Visit via Telephone Note  I connected with Tina Hayes on 05/01/21 at  2:15 PM EST by telephone and verified that I am speaking with the correct person using two identifiers.  Location: Patient: Home Provider: Pain control center   I discussed the limitations, risks, security and privacy concerns of performing an evaluation and management service by telephone and the availability of in person appointments. I also discussed with the patient that there may be a patient responsible charge related to this service. The patient expressed understanding and agreed to proceed.   History of Present Illness: I spoke with Tina Hayes today via telephone as she was unable to do the video link for the virtual conference but she states that she is having a lot more low back pain with radiating leg pain.  This is consistent with what she is experienced in the past with her spinal stenosis.  Her last epidural was in September of last year and she generally gets these approximately 3-4 times a year.  I gave her about 75 to 80% relief lasting about 2 to 3 months before she gets recurrence of the same quality pain.  Otherwise she is in her usual state of health.  It is mainly an aching gnawing pain radiating from the low back down the legs and its refractory to physical therapy and home exercise therapy.  Otherwise she states that the quality characteristic and distribution of her pain has been stable in nature.  She still taking her hydrocodone as scheduled and this continues to give her good relief.  She has failed more conservative therapy.  Her bowel and bladder function and lower extremity strength and function are stable in nature as well.  Review of systems: General: No fevers or chills Pulmonary: No shortness of breath or dyspnea Cardiac: No angina or palpitations or lightheadedness GI: No abdominal pain or constipation Psych: No depression    Observations/Objective:  Current Outpatient Medications:     [START ON 06/02/2021] HYDROcodone-acetaminophen (NORCO) 7.5-325 MG tablet, Take 1 tablet by mouth every 6 (six) hours as needed for moderate pain or severe pain., Disp: 120 tablet, Rfl: 0   acetaminophen (TYLENOL) 500 MG tablet, Take 500 mg by mouth every 6 (six) hours as needed for headache, moderate pain or fever., Disp: , Rfl:    albuterol (PROVENTIL HFA;VENTOLIN HFA) 108 (90 Base) MCG/ACT inhaler, Inhale 2 puffs into the lungs every 6 (six) hours as needed for wheezing or shortness of breath., Disp: 1 Inhaler, Rfl: 2   albuterol (PROVENTIL) (2.5 MG/3ML) 0.083% nebulizer solution, Take 2.5 mg every 4 (four) hours as needed by nebulization for wheezing or shortness of breath., Disp: , Rfl:    amLODipine (NORVASC) 5 MG tablet, Take 1 tablet (5 mg total) by mouth daily., Disp: 30 tablet, Rfl: 6   atorvastatin (LIPITOR) 40 MG tablet, Take 1 tablet (40 mg total) by mouth daily., Disp: 90 tablet, Rfl: 1   B-D UF III MINI PEN NEEDLES 31G X 5 MM MISC, USE WITH INSULIN PEN INJECTIONS TWICE DAILY, Disp: , Rfl:    carvedilol (COREG) 3.125 MG tablet, Take 1 tablet (3.125 mg total) by mouth daily as needed., Disp: 90 tablet, Rfl: 3   clopidogrel (PLAVIX) 75 MG tablet, Take 1 tablet (75 mg total) by mouth daily., Disp: 90 tablet, Rfl: 3   [START ON 05/03/2021] HYDROcodone-acetaminophen (NORCO) 7.5-325 MG tablet, Take 1 tablet by mouth every 6 (six) hours as needed for moderate pain or severe pain., Disp: 120 tablet, Rfl:  0   Ipratropium-Albuterol (COMBIVENT) 20-100 MCG/ACT AERS respimat, Inhale into the lungs., Disp: , Rfl:    isosorbide mononitrate (IMDUR) 30 MG 24 hr tablet, Take 1 tablet (30 mg total) by mouth daily., Disp: 180 tablet, Rfl: 3   LANTUS SOLOSTAR 100 UNIT/ML Solostar Pen, Inject 30-40 Units into the skin See admin instructions. Inject 30 units subcutaneously in the morning at 40 units subcutaneously at bedtime., Disp: , Rfl:    levETIRAcetam (KEPPRA) 750 MG tablet, Take 750 mg by mouth 2 (two)  times daily., Disp: , Rfl:    losartan (COZAAR) 100 MG tablet, Take 1 tablet (100 mg total) by mouth daily., Disp: 90 tablet, Rfl: 3   nitroGLYCERIN (NITROSTAT) 0.4 MG SL tablet, DISSOLVE ONE TABLET UNDER THE TONGUE EVERY 5 MINUTES AS NEEDED FOR CHEST PAIN.  DO NOT EXCEED A TOTAL OF 3 DOSES IN 15 MINUTES, Disp: 25 tablet, Rfl: 0   ondansetron (ZOFRAN) 4 MG tablet, Take 4 mg by mouth as needed., Disp: , Rfl:    pantoprazole (PROTONIX) 40 MG tablet, Take 1 tablet (40 mg total) by mouth 2 (two) times daily., Disp: 60 tablet, Rfl: 0   potassium chloride SA (KLOR-CON) 20 MEQ tablet, Take 2 tablets (40 mEq total) by mouth 2 (two) times daily. Take extra 2 tablets (40 meq) if you take metolazone, Disp: 140 tablet, Rfl: 5   tiotropium (SPIRIVA) 18 MCG inhalation capsule, Place 18 mcg into inhaler and inhale daily., Disp: , Rfl:    torsemide (DEMADEX) 20 MG tablet, Take 1 tablet (20 mg) by mouth twice daily, Disp: , Rfl:    traZODone (DESYREL) 50 MG tablet, Take 50 mg by mouth at bedtime., Disp: , Rfl:   Past Medical History:  Diagnosis Date   (HFpEF) heart failure with preserved ejection fraction ([place])    a. 05/2016 Echo: EF 60-65%, mild to mod LVH, Gr1 DD, mild MR, mildly dil LA, mod TR, mildly to mod increased PASP; b. 07/2020 Echo: EF 55-60%, no rwma, mild LVH, GrIDD, mildly dil LA, mild MR, mild-mod TR, mild-mod AoV sclerosis w/o stenosis.   Anxiety    Arthritis    Chronic back pain    COPD (chronic obstructive pulmonary disease) (HCC)    Coronary artery disease    a. s/p remote PCI x 5;  b. 2006 s/p CABG x 3 ([place], [place]); b. 05/2016 MV: attenuation corrected images w/o ischemia or wma-->Med rx; c. 06/2020 MV: EF 55-65%, no isch/infart-->low risk.   Depression    Diabetes mellitus without complication ([place])    Essential hypertension 06/30/2016   GERD (gastroesophageal reflux disease)    Heart attack ([place])    Total of 3 per pt.   Hyperlipidemia LDL goal <55     Hypertensive urgency 06/03/2015   PAD (peripheral artery disease) ([place])    a. 06/2019 Angio: R AT (PTA), R POP (PTA & Viabahn stenting); b. 09/2020 ABIs: R 1.24, L 1.35.   Palpitations    a. 06/2020 Zio: Sinus brady, 54 (40-143), 4 beats NSVT, 3 SVT runs (max 143 bpm x 4 beats).   Seizure ([place])     Assessment and Plan:  1. Bilateral sciatica   2. Lumbar spondylosis with myelopathy   3. DDD (degenerative disc disease), lumbar   4. Chronic, continuous use of opioids   5. Chronic pain syndrome   6. Facet arthritis of lumbosacral region   7. Morbid obesity ([place])   8. Spinal stenosis of lumbar region with neurogenic claudication  9. Primary osteoarthritis of both knees   Based on her discussion today and after review of the [facility] practitioner database information where to refill her medicines for January 6 in February 5.  No changes will remain in her pharmacologic regimen.  I am going to set her up for an epidural in 1 month.  Fortunately she does respond favorably to these.  We will have her continue follow-up with her primary care physician for baseline medical care with return to clinic in 1 month. DC plavix 7 days in advance.  Follow Up Instructions:    I discussed the assessment and treatment plan with the patient. The patient was provided an opportunity to ask questions and all were answered. The patient agreed with the plan and demonstrated an understanding of the instructions.   The patient was advised to call back or seek an in-person evaluation if the symptoms worsen or if the condition fails to improve as anticipated.  I provided 30 minutes of non-face-to-face time during this encounter.   Molli Barrows, MD

## 2021-05-16 ENCOUNTER — Ambulatory Visit: Payer: Medicare HMO | Admitting: Nurse Practitioner

## 2021-05-23 ENCOUNTER — Telehealth: Payer: Self-pay

## 2021-05-23 NOTE — Telephone Encounter (Signed)
Patient called in needing her pantoprazole filled she states she is out and needs we didn't fill that medicine dr Manuella Ghazi did she is [place] call him

## 2021-05-28 ENCOUNTER — Other Ambulatory Visit: Payer: Self-pay

## 2021-05-28 ENCOUNTER — Emergency Department
Admission: EM | Admit: 2021-05-28 | Discharge: 2021-05-29 | Disposition: A | Discharge status: Home / Self Care | Payer: Medicare HMO | Attending: Emergency Medicine | Admitting: Emergency Medicine

## 2021-05-28 DIAGNOSIS — K2901 Acute gastritis with bleeding: Secondary | ICD-10-CM | POA: Diagnosis not present

## 2021-05-28 DIAGNOSIS — K219 Gastro-esophageal reflux disease without esophagitis: Secondary | ICD-10-CM | POA: Diagnosis not present

## 2021-05-28 DIAGNOSIS — K2900 Acute gastritis without bleeding: Secondary | ICD-10-CM

## 2021-05-28 DIAGNOSIS — R112 Nausea with vomiting, unspecified: Secondary | ICD-10-CM | POA: Diagnosis present

## 2021-05-28 DIAGNOSIS — R1010 Upper abdominal pain, unspecified: Secondary | ICD-10-CM

## 2021-05-28 DIAGNOSIS — R0789 Other chest pain: Secondary | ICD-10-CM | POA: Diagnosis not present

## 2021-05-28 LAB — COMPREHENSIVE METABOLIC PANEL
ALT: 11 U/L (ref 0–44)
AST: 16 U/L (ref 15–41)
Albumin: 3.7 g/dL (ref 3.5–5.0)
Alkaline Phosphatase: 101 U/L (ref 38–126)
Anion gap: 6 (ref 5–15)
BUN: 14 mg/dL (ref 8–23)
CO2: 26 mmol/L (ref 22–32)
Calcium: 9.2 mg/dL (ref 8.9–10.3)
Chloride: 107 mmol/L (ref 98–111)
Creatinine, Ser: 0.83 mg/dL (ref 0.44–1.00)
GFR, Estimated: >60 mL/min (ref >60)
Glucose, Bld: 78 mg/dL (ref 70–99)
Potassium: 4 mmol/L (ref 3.5–5.1)
Sodium: 139 mmol/L (ref 135–145)
Total Bilirubin: 0.4 mg/dL (ref 0.3–1.2)
Total Protein: 6.9 g/dL (ref 6.5–8.1)

## 2021-05-28 LAB — CBC
HCT: 31.3 % — ABNORMAL LOW (ref 36.0–46.0)
Hemoglobin: 9.7 g/dL — ABNORMAL LOW (ref 12.0–15.0)
MCH: 27.1 pg (ref 26.0–34.0)
MCHC: 31 g/dL (ref 30.0–36.0)
MCV: 87.4 fL (ref 80.0–100.0)
Platelets: 254 10*3/uL (ref 150–400)
RBC: 3.58 MIL/uL — ABNORMAL LOW (ref 3.87–5.11)
RDW: 15.3 % (ref 11.5–15.5)
WBC: 5 10*3/uL (ref 4.0–10.5)
nRBC: 0 % (ref 0.0–0.2)

## 2021-05-28 LAB — LIPASE, BLOOD: Lipase: 30 U/L (ref 11–51)

## 2021-05-28 NOTE — ED Triage Notes (Signed)
Pt arrives via ems from home, pt states that she had a procedure 3weeks ago for hiatal hernia and acid reflux, pt states that she has started back vomiting again each time she tries to eat and gagging, states that her stomach hurts and noticed today with her stool it was very dark

## 2021-05-28 NOTE — ED Notes (Signed)
First nurse-pt brought in via ems from home with vomiting and possible rectal bleeding.  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  Bp133/103,pulse58, sats97% fsbs 167 per ems.  Pt in wheelchair in lobby.  Pt alert.

## 2021-05-29 ENCOUNTER — Emergency Department: Payer: Medicare HMO

## 2021-05-29 ENCOUNTER — Encounter: Payer: Self-pay | Admitting: Radiology

## 2021-05-29 DIAGNOSIS — K2901 Acute gastritis with bleeding: Secondary | ICD-10-CM | POA: Diagnosis not present

## 2021-05-29 LAB — URINALYSIS, ROUTINE W REFLEX MICROSCOPIC
Bilirubin Urine: NEGATIVE
Glucose, UA: NEGATIVE mg/dL
Hgb urine dipstick: NEGATIVE
Ketones, ur: NEGATIVE mg/dL
Leukocytes,Ua: NEGATIVE
Nitrite: NEGATIVE
Protein, ur: NEGATIVE mg/dL
Specific Gravity, Urine: 1.02 (ref 1.005–1.030)
pH: 7 (ref 5.0–8.0)

## 2021-05-29 MED ORDER — PANTOPRAZOLE SODIUM 40 MG IV SOLR
40.0000 mg | Freq: Once | INTRAVENOUS | Status: AC
Start: 2021-05-29 — End: 2021-05-29
  Administered 2021-05-29: 40 mg via INTRAVENOUS
  Filled 2021-05-29: qty 40

## 2021-05-29 MED ORDER — IOHEXOL 300 MG/ML  SOLN: 100.0000 mL | Freq: Once | INTRAMUSCULAR | Status: DC | PRN

## 2021-05-29 MED ORDER — PANTOPRAZOLE SODIUM 40 MG PO TBEC: 40.0000 mg | DELAYED_RELEASE_TABLET | Freq: Two times a day (BID) | ORAL | 1 refills | Status: DC

## 2021-05-29 MED ORDER — IOHEXOL 300 MG/ML  SOLN
125.0000 mL | Freq: Once | INTRAMUSCULAR | Status: AC | PRN
  Administered 2021-05-29: 125 mL via INTRAVENOUS

## 2021-05-29 MED ORDER — ONDANSETRON HCL 4 MG/2ML IJ SOLN
4.0000 mg | INTRAMUSCULAR | Status: AC
  Administered 2021-05-29: 4 mg via INTRAVENOUS
  Filled 2021-05-29: qty 2

## 2021-05-29 MED ORDER — SUCRALFATE 1 G PO TABS: 1.0000 g | ORAL_TABLET | Freq: Four times a day (QID) | ORAL | 1 refills | Status: DC | PRN

## 2021-05-29 NOTE — Discharge Instructions (Addendum)
As we discussed, your evaluation was reassuring tonight and we think that your symptoms are most likely due to your acid reflux and because you ran out of your pantoprazole.  It is available over-the-counter but we sent another prescription to your pharmacy, as well as for a medication called Carafate which can also help soothe your stomach.  Please follow-up with your regular primary care provider or with your GI doctor at the next available opportunity.  Return to the emergency department if you develop new or worsening symptoms that concern you.

## 2021-05-29 NOTE — ED Provider Notes (Signed)
[facility] Provider Note    Event Date/Time   First MD Initiated Contact with Patient 05/28/21 2315     (approximate)   History   Abdominal Pain and Emesis   HPI  Tina Hayes is a 76 y.o. female with a medical history that includes hiatal hernia, obesity, and acid reflux with prior EGD by Dr. Mardi Mainland.  She presents for evaluation of abdominal pain for the last several days with nausea and vomiting.  She said it has been going on about 3 days.  She thinks it might be because she ran out of her pantoprazole.  She believes that she had abdominal surgery for her hiatal hernia 3 weeks ago.  However she cannot remember who did the procedure, where it was done (including what hospital), or what kind of procedure was performed.  She said that she has only been having problems for the last 3 days, however, since her medication ran out.  She denies fever, sore throat, chest pain, shortness of breath.     Physical Exam   Triage Vital Signs: ED Triage Vitals [05/28/21 1904]  Enc Vitals Group     BP (!) 189/64     Pulse Rate (!) 54     Resp 18     Temp 98.6 F (37 C)     Temp Source Oral     SpO2 98 %     Weight 117.9 kg (260 lb)     Height 1.803 m (5\' 11" )     Head Circumference      Peak Flow      Pain Score 4     Pain Loc      Pain Edu?      Excl. in [place]?     Most recent vital signs: Vitals:   05/28/21 1904 05/29/21 0303  BP: (!) 189/64 (!) 160/71  Pulse: (!) 54 (!) 53  Resp: 18 17  Temp: 98.6 F (37 C) 97.8 F (36.6 C)  SpO2: 98% 99%     General: Awake, no distress.  CV:  Good peripheral perfusion.  Resp:  Normal effort.  Abd:  Obese.  No distention.  No significant tenderness to palpation throughout the abdomen including the epigastrium.  No obvious recent surgical scars.   ED Results / Procedures / Treatments   Labs (all labs ordered are listed, but only abnormal results are displayed) Labs Reviewed  CBC - Abnormal; Notable for the  following components:      Result Value   RBC 3.58 (*)    Hemoglobin 9.7 (*)    HCT 31.3 (*)    All other components within normal limits  LIPASE, BLOOD  COMPREHENSIVE METABOLIC PANEL  URINALYSIS, ROUTINE W REFLEX MICROSCOPIC     RADIOLOGY No acute abnormalities identified on CT chest/abdomen/pelvis by myself or the radiologist.  See hospital course for additional details.    PROCEDURES:  Critical Care performed: No  Procedures   MEDICATIONS ORDERED IN ED: Medications  iohexol (OMNIPAQUE) 300 MG/ML solution 125 mL (125 mLs Intravenous Contrast Given 05/29/21 0212)  ondansetron (ZOFRAN) injection 4 mg (4 mg Intravenous Given 05/29/21 0309)  pantoprazole (PROTONIX) injection 40 mg (40 mg Intravenous Given 05/29/21 0333)     IMPRESSION / MDM / ASSESSMENT AND PLAN / ED COURSE  I reviewed the triage vital signs and the nursing notes.  Differential diagnosis includes, but is not limited to, acid reflux, gastritis, SBO/ileus, esophageal issue, nonspecific procedural complication.  I reviewed the patient's medical record including a procedural report from about 02/25/2021 when she had an EGD by Dr. Mardi Mainland.  I also see that a few weeks ago she had an appointment with Dr. Andree Elk with anesthesiology about pain control, but this was unrelated to GI issues.  I can find no indication in any system in care everywhere or [facility] where she has had a GI procedure within the last 3 weeks.  I also see no recent wounds on her abdomen.  Given the patient's complicated history and discomfort with nausea, I will give her Zofran 4 mg IV and we will proceed with a CT scan of the chest, abdomen, and pelvis.  This should better visualize the length of her esophagus as well as her stomach and lower abdomen to see if there is any sign of acute abnormality.  I requested a low quantity of p.o. contrast and she was able to tolerate only a little bit of it.  Labs ordered include CMP, lipase,  urinalysis, and CBC.  I reviewed all the results and they are all within normal limits except for a stably low hemoglobin.  No transaminitis, no kidney dysfunction, etc.  Vital signs are stable and within normal limits other than mild bradycardia.  She appears slightly uncomfortable but in no distress.  She agrees with the plan.    Clinical Course as of 05/29/21 0402  Wed May 29, 2021  0334 I personally reviewed the patient's CT of the chest, abdomen, and pelvis.  She has a hiatal hernia similar to prior but no obvious obstruction or ileus.  There is no evidence of acute abnormality.  The radiologist agreed and said that there is nothing acute going on.  I reassessed the patient and she said that she feels better.  I ordered pantoprazole 40 mg IV because I think that mostly she was having symptoms due to uncontrolled acid reflux that she went from taking 40 mg of pantoprazole twice a day to 0 when she ran out.  She was apparently unaware that this was available over-the-counter.  After getting some Zofran she was able to tolerate oral intake and she wants to go home.  I provided a new prescription for pantoprazole as well as a prescription for Carafate and strongly encouraged close outpatient follow-up with primary care and GI.  I considered admission initially, but since she is able to tolerate p.o., has no evidence of an acute or emergent condition, and wants to go home, I think that is appropriate.  I gave my usual and customary return precautions. [CF]    Clinical Course User Index [CF] Hinda Kehr, MD     FINAL CLINICAL IMPRESSION(S) / ED DIAGNOSES   Final diagnoses:  Pain of upper abdomen  Acute gastritis, presence of bleeding unspecified, unspecified gastritis type  Gastroesophageal reflux disease, unspecified whether esophagitis present     Rx / DC Orders   ED Discharge Orders          Ordered    pantoprazole (PROTONIX) 40 MG tablet  2 times daily        05/29/21 0352     sucralfate (CARAFATE) 1 g tablet  4 times daily PRN        05/29/21 0352             Note:  This document was prepared using Dragon voice recognition software and may include  unintentional dictation errors.   Hinda Kehr, MD 05/29/21 [phone]

## 2021-05-29 NOTE — ED Notes (Signed)
Patient assisted from restroom by this RN. Patient awaiting for ride.

## 2021-05-29 NOTE — ED Notes (Signed)
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 Pt may wait in a hallway bed until morning when her son gets off work per charge, Therapist, sports

## 2021-05-30 ENCOUNTER — Ambulatory Visit: Payer: Medicare HMO | Admitting: Nurse Practitioner

## 2021-06-17 ENCOUNTER — Ambulatory Visit (HOSPITAL_BASED_OUTPATIENT_CLINIC_OR_DEPARTMENT_OTHER): Payer: Medicare HMO | Admitting: Anesthesiology

## 2021-06-17 ENCOUNTER — Other Ambulatory Visit: Payer: Self-pay

## 2021-06-17 ENCOUNTER — Ambulatory Visit
Admission: RE | Admit: 2021-06-17 | Discharge: 2021-06-17 | Disposition: A | Discharge status: Home / Self Care | Payer: Medicare HMO | Source: Ambulatory Visit | Attending: Anesthesiology | Admitting: Anesthesiology

## 2021-06-17 ENCOUNTER — Other Ambulatory Visit: Payer: Self-pay | Admitting: Anesthesiology

## 2021-06-17 ENCOUNTER — Encounter: Payer: Self-pay | Admitting: Anesthesiology

## 2021-06-17 VITALS — BP 196/82 | HR 59 | Resp 20 | Ht 71.0 in | Wt 260.0 lb

## 2021-06-17 DIAGNOSIS — R52 Pain, unspecified: Secondary | ICD-10-CM | POA: Diagnosis present

## 2021-06-17 DIAGNOSIS — M48062 Spinal stenosis, lumbar region with neurogenic claudication: Secondary | ICD-10-CM

## 2021-06-17 DIAGNOSIS — M5431 Sciatica, right side: Secondary | ICD-10-CM | POA: Insufficient documentation

## 2021-06-17 DIAGNOSIS — M4716 Other spondylosis with myelopathy, lumbar region: Secondary | ICD-10-CM | POA: Insufficient documentation

## 2021-06-17 DIAGNOSIS — M5432 Sciatica, left side: Secondary | ICD-10-CM | POA: Insufficient documentation

## 2021-06-17 DIAGNOSIS — M5136 Other intervertebral disc degeneration, lumbar region: Secondary | ICD-10-CM

## 2021-06-17 DIAGNOSIS — G894 Chronic pain syndrome: Secondary | ICD-10-CM | POA: Insufficient documentation

## 2021-06-17 DIAGNOSIS — F1190 Opioid use, unspecified, uncomplicated: Secondary | ICD-10-CM

## 2021-06-17 DIAGNOSIS — M51369 Other intervertebral disc degeneration, lumbar region without mention of lumbar back pain or lower extremity pain: Secondary | ICD-10-CM

## 2021-06-17 MED ORDER — HYDROCODONE-ACETAMINOPHEN 7.5-325 MG PO TABS: 1.0000 | ORAL_TABLET | Freq: Four times a day (QID) | ORAL | 0 refills | Status: AC | PRN

## 2021-06-17 MED ORDER — ROPIVACAINE HCL 2 MG/ML IJ SOLN
INTRAMUSCULAR | Status: AC
  Filled 2021-06-17: qty 20

## 2021-06-17 MED ORDER — MIDAZOLAM HCL 5 MG/5ML IJ SOLN
2.0000 mg | Freq: Once | INTRAMUSCULAR | Status: AC
  Administered 2021-06-17: 2 mg via INTRAVENOUS
  Filled 2021-06-17: qty 5

## 2021-06-17 MED ORDER — SODIUM CHLORIDE 0.9% FLUSH
10.0000 mL | Freq: Once | INTRAVENOUS | Status: AC
  Administered 2021-06-17: 10 mL

## 2021-06-17 MED ORDER — LIDOCAINE HCL (PF) 1 % IJ SOLN
5.0000 mL | Freq: Once | INTRAMUSCULAR | Status: AC
  Administered 2021-06-17: 5 mL via SUBCUTANEOUS
  Filled 2021-06-17: qty 5

## 2021-06-17 MED ORDER — ROPIVACAINE HCL 2 MG/ML IJ SOLN
10.0000 mL | Freq: Once | INTRAMUSCULAR | Status: AC
  Administered 2021-06-17: 10 mL via EPIDURAL

## 2021-06-17 MED ORDER — IOHEXOL 180 MG/ML  SOLN
10.0000 mL | Freq: Once | INTRAMUSCULAR | Status: AC | PRN
  Administered 2021-06-17: 10 mL via EPIDURAL
  Filled 2021-06-17: qty 10

## 2021-06-17 MED ORDER — TRIAMCINOLONE ACETONIDE 40 MG/ML IJ SUSP
40.0000 mg | Freq: Once | INTRAMUSCULAR | Status: AC
  Administered 2021-06-17: 40 mg
  Filled 2021-06-17: qty 1

## 2021-06-17 MED ORDER — HYDROCODONE-ACETAMINOPHEN 7.5-325 MG PO TABS: 1.0000 | ORAL_TABLET | Freq: Four times a day (QID) | ORAL | 0 refills | Status: DC | PRN

## 2021-06-17 NOTE — Patient Instructions (Signed)
You may restart your Plavix tomorrow. ____________________________________________________________________________________________  Post-procedure Information What to expect: Most procedures involve the use of a local anesthetic (numbing medicine), and a steroid (anti-inflammatory medicine).  The local anesthetics may cause temporary numbness and weakness of the legs or arms, depending on the location of the block. This numbness/weakness may last 4-6 hours, depending on the local anesthetic used. In rare instances, it can last up to 24 hours. While numb, you must be very careful not to injure the extremity.  After any procedure, you could expect the pain to get better within 15-20 minutes. This relief is temporary and may last 4-6 hours. Once the local anesthetics wears off, you could experience discomfort, possibly more than usual, for up to 10 (ten) days. In the case of radiofrequencies, it may last up to 6 weeks. Surgeries may take up to 8 weeks for the healing process. The discomfort is due to the irritation caused by needles going through skin and muscle. To minimize the discomfort, we recommend using ice the first day, and heat from then on. The ice should be applied for 15 minutes on, and 15 minutes off. Keep repeating this cycle until bedtime. Avoid applying the ice directly to the skin, to prevent frostbite. Heat should be used daily, until the pain improves (4-10 days). Be careful not to burn yourself.  Occasionally you may experience muscle spasms or cramps. These occur as a consequence of the irritation caused by the needle sticks to the muscle and the blood that will inevitably be lost into the surrounding muscle tissue. Blood tends to be very irritating to tissues, which tend to react by going into spasm. These spasms may start the same day of your procedure, but they may also take days to develop. This late onset type of spasm or cramp is usually caused by electrolyte imbalances triggered  by the steroids, at the level of the kidney. Cramps and spasms tend to respond well to muscle relaxants, multivitamins (some are triggered by the procedure, but may have their origins in vitamin deficiencies), and Gatorade, or any sports drinks that can replenish any electrolyte imbalances. (If you are a diabetic, ask your pharmacist to get you a sugar-free brand.) Warm showers or baths may also be helpful. Stretching exercises are highly recommended.  General Instructions:  Be alert for signs of possible infection: redness, swelling, heat, red streaks, elevated temperature, and/or fever. These typically appear 4 to 6 days after the procedure. Immediately notify your doctor if you experience unusual bleeding, difficulty breathing, or loss of bowel or bladder control. If you experience increased pain, do not increase your pain medicine intake, unless instructed by your pain physician.  Post-Procedure Care:  Be careful in moving about. Muscle spasms in the area of the injection may occur. Applying ice or heat to the area is often helpful. The incidence of spinal headaches after epidural injections ranges between 1.4% and 6%. If you develop a headache that does not seem to respond to conservative therapy, please let your physician know. This can be treated with an epidural blood patch.   Post-procedure numbness or redness is to be expected, however it should average 4 to 6 hours. If numbness and weakness of your extremities begins to develop 4 to 6 hours after your procedure, and is felt to be progressing and worsening, immediately contact your physician.  Diet:  If you experience nausea, do not eat until this sensation goes away. If you had a Stellate Ganglion Block for upper extremity Reflex  Sympathetic Dystrophy, do not eat or drink until your hoarseness goes away. In any case, always start with liquids first and if you tolerate them well, then slowly progress to more solid foods.  Activity:  For  the first 4 to 6 hours after the procedure, use caution in moving about as you may experience numbness and/or weakness. Use caution in cooking, using household electrical appliances, and climbing steps. If you need to reach your Doctor call our office: [phone] (During business hours) or [phone] (After business hours).  Business Hours: Monday-Thursday 8:00 am - 4:00 PM    Fridays: Closed     In case of an emergency: In case of emergency, call 911 or go to the nearest emergency room and have the physician there call us.  Interpretation of Procedure Every nerve block has two components: a diagnostic component, and a treatment component. Unrealistic expectations are the most common causes of perceived failure.  In a perfect world, a single nerve block should be able to completely and permanently eliminate the pain. Sadly, the world is not perfect.  Most pain management nerve blocks are performed using local anesthetics and steroids. Steroids are responsible for any long-term benefit that you may experience. Their purpose is to decrease any chronic swelling that may exist in the area. Steroids begin to work immediately after being injected. However, most patients will not experience any benefits until 5 to 10 days after the injection, when the swelling has come down to the point where they can tell a difference. Steroids will only help if there is swelling to be treated. As such, they can assist with the diagnosis. If effective, they suggest an inflammatory component to the pain, and if ineffective, they rule out inflammation as the main cause or component of the problem. If the problem is one of mechanical compression, you will get no benefit from those steroids.   In the case of local anesthetics, they have a crucial role in the diagnosis of your condition. Most will begin to work within15 to 20 minutes after injection. The duration will depend on the type used (short- vs. Long-acting).  It is of outmost importance that patients keep tract of their pain, after the procedure. To assist with this matter, a Post-procedure Pain Diary is provided. Make sure to complete it and to bring it back to your follow-up appointment.  As long as the patient keeps accurate, detailed records of their symptoms after every procedure, and returns to have those interpreted, every procedure will provide [place] with invaluable information. Even a block that does not provide the patient with any relief, will always provide [place] with information about the mechanism and the origin of the pain. The only time a nerve block can be considered a waste of time is when patients do not keep track of the results, or do not keep their post-procedure appointment.  Reporting the results back to your physician The Pain Score  Pain is a subjective complaint. It cannot be seen, touched, or measured. We depend entirely on the patients report of the pain in order to assess your condition and treatment. To evaluate the pain, we use a pain scale, where 0 means No Pain, and a 10 is the worst possible pain that you can even imagine (i.e. something like been eaten alive by a shark or being torn apart by a lion).   Use the Pain Scale provided. You will frequently be asked to rate your pain. Please be accurate, remember that medical  decisions will be based on your responses. Please do not rate your pain above a 10. Doing so is actually interpreted as symptom magnification (exaggeration). To put this into perspective, when you tell us that your pain is at a 10 (ten), what you are saying is that there is nothing we can do to make this pain any worse. (Carefully think about that.) ____________________________________________________________________________________________

## 2021-06-17 NOTE — Progress Notes (Signed)
Nursing Pain Medication Assessment:  Safety precautions to be maintained throughout the outpatient stay will include: orient to surroundings, keep bed in low position, maintain call bell within reach at all times, provide assistance with transfer out of bed and ambulation.  Medication Inspection Compliance: Tina Hayes did not comply with our request to bring her pills to be counted. She was reminded that bringing the medication bottles, even when empty, is a requirement.  Medication: Hydrocodone Pill/Patch Count: None available to be counted. Bottle Appearance: No container available. Did not bring bottle(s) to appointment. Filled Date: N/A Last Medication intake:  Today

## 2021-06-18 NOTE — Progress Notes (Signed)
Subjective:  Patient ID: Tina Hayes, female    DOB: 1945-08-02  Age: 76 y.o. MRN: [id number]  CC: Back Pain (lower)   Procedure: L5-S1 epidural steroid under fluoroscopic guidance with minimal sedation  HPI Tina Hayes presents for reevaluation.  Tina Hayes has had a chronic history of low back pain and intermittent sciatica.  As of recently she is reporting a flareup of her sciatica symptoms affecting both lower legs.  The pain radiates from the low back into the posterior thighs and calves.  Both legs are involved.  In the past she has had epidural steroids intermittently.  She generally gets about 75 to 80% relief for about 2 to 2-1/2 months before she has recurrence of her same baseline pain.  Her last epidurals were in September and February of last year and she had good success with this.  She desires to proceed with a repeat injection today.  No change in lower extremity strength or function is noted but she is effectively using a walker for wheelchair assistance secondary to her weight and osteoarthritic symptoms at baseline.  Bowel bladder function has been stable.  The quality characteristic and distribution of her pain in the low back has otherwise been stable and she takes her medications as prescribed and these continue to give her good effective relief of her recalcitrant low back pain.  Outpatient Medications Prior to Visit  Medication Sig Dispense Refill   acetaminophen (TYLENOL) 500 MG tablet Take 500 mg by mouth every 6 (six) hours as needed for headache, moderate pain or fever.     albuterol (PROVENTIL HFA;VENTOLIN HFA) 108 (90 Base) MCG/ACT inhaler Inhale 2 puffs into the lungs every 6 (six) hours as needed for wheezing or shortness of breath. 1 Inhaler 2   albuterol (PROVENTIL) (2.5 MG/3ML) 0.083% nebulizer solution Take 2.5 mg every 4 (four) hours as needed by nebulization for wheezing or shortness of breath.     atorvastatin (LIPITOR) 40 MG tablet Take 1 tablet (40 mg  total) by mouth daily. 90 tablet 1   B-D UF III MINI PEN NEEDLES 31G X 5 MM MISC USE WITH INSULIN PEN INJECTIONS TWICE DAILY     carvedilol (COREG) 3.125 MG tablet Take 1 tablet (3.125 mg total) by mouth daily as needed. 90 tablet 3   clopidogrel (PLAVIX) 75 MG tablet Take 1 tablet (75 mg total) by mouth daily. 90 tablet 3   Ipratropium-Albuterol (COMBIVENT) 20-100 MCG/ACT AERS respimat Inhale into the lungs.     isosorbide mononitrate (IMDUR) 30 MG 24 hr tablet Take 1 tablet (30 mg total) by mouth daily. 180 tablet 3   LANTUS SOLOSTAR 100 UNIT/ML Solostar Pen Inject 30-40 Units into the skin See admin instructions. Inject 30 units subcutaneously in the morning at 40 units subcutaneously at bedtime.     levETIRAcetam (KEPPRA) 750 MG tablet Take 750 mg by mouth 2 (two) times daily.     losartan (COZAAR) 100 MG tablet Take 1 tablet (100 mg total) by mouth daily. 90 tablet 3   nitroGLYCERIN (NITROSTAT) 0.4 MG SL tablet DISSOLVE ONE TABLET UNDER THE TONGUE EVERY 5 MINUTES AS NEEDED FOR CHEST PAIN.  DO NOT EXCEED A TOTAL OF 3 DOSES IN 15 MINUTES 25 tablet 0   ondansetron (ZOFRAN) 4 MG tablet Take 4 mg by mouth as needed.     pantoprazole (PROTONIX) 40 MG tablet Take 1 tablet (40 mg total) by mouth 2 (two) times daily. 60 tablet 1   potassium chloride SA (KLOR-CON) 20  MEQ tablet Take 2 tablets (40 mEq total) by mouth 2 (two) times daily. Take extra 2 tablets (40 meq) if you take metolazone 140 tablet 5   sucralfate (CARAFATE) 1 g tablet Take 1 tablet (1 g total) by mouth 4 (four) times daily as needed (for abdominal discomfort, nausea, and/or vomiting). 30 tablet 1   tiotropium (SPIRIVA) 18 MCG inhalation capsule Place 18 mcg into inhaler and inhale daily.     torsemide (DEMADEX) 20 MG tablet Take 1 tablet (20 mg) by mouth twice daily     traZODone (DESYREL) 50 MG tablet Take 50 mg by mouth at bedtime.     HYDROcodone-acetaminophen (NORCO) 7.5-325 MG tablet Take 1 tablet by mouth every 6 (six) hours as  needed for moderate pain or severe pain. 120 tablet 0   amLODipine (NORVASC) 5 MG tablet Take 1 tablet (5 mg total) by mouth daily. 30 tablet 6   No facility-administered medications prior to visit.    Review of Systems CNS: No confusion or sedation Cardiac: No angina or palpitations GI: No abdominal pain or constipation Constitutional: No nausea vomiting fevers or chills  Objective:  BP (!) 196/82    Pulse (!) 59 Comment: SB   Resp 20    Ht 5\' 11"  (1.803 m)    Wt 260 lb (117.9 kg)    SpO2 99%    BMI 36.26 kg/m    BP Readings from Last 3 Encounters:  06/17/21 (!) 196/82  05/29/21 (!) 173/75  02/26/21 113/61     Wt Readings from Last 3 Encounters:  06/17/21 260 lb (117.9 kg)  05/28/21 260 lb (117.9 kg)  02/24/21 260 lb (117.9 kg)     Physical Exam Pt is alert and oriented PERRL EOMI HEART IS RRR no murmur or rub LCTA no wheezing or rales MUSCULOSKELETAL reveals some paraspinous muscle tenderness in the low back but no overt trigger points.  Her muscle tone and bulk to the lower extremities is at baseline.  She is in obvious discomfort as she moves about in her wheelchair today.  Labs  Lab Results  Component Value Date   HGBA1C 7.7 (H) 02/24/2021   HGBA1C 6.5 (H) 01/18/2019   HGBA1C 7.5 (H) 05/27/2018   Lab Results  Component Value Date   LDLCALC 58 01/30/2017   CREATININE 0.83 05/28/2021    -------------------------------------------------------------------------------------------------------------------- Lab Results  Component Value Date   WBC 5.0 05/28/2021   HGB 9.7 (L) 05/28/2021   HCT 31.3 (L) 05/28/2021   PLT 254 05/28/2021   GLUCOSE 78 05/28/2021   CHOL 120 01/30/2017   TRIG 73 01/30/2017   HDL 47 01/30/2017   LDLCALC 58 01/30/2017   ALT 11 05/28/2021   AST 16 05/28/2021   NA 139 05/28/2021   K 4.0 05/28/2021   CL 107 05/28/2021   CREATININE 0.83 05/28/2021   BUN 14 05/28/2021   CO2 26 05/28/2021   TSH 2.640 07/13/2020   INR 0.9  02/24/2021   HGBA1C 7.7 (H) 02/24/2021    --------------------------------------------------------------------------------------------------------------------- DG PAIN CLINIC C-ARM 1-60 MIN NO REPORT  Result Date: 06/17/2021 Fluoro was used, but no Radiologist interpretation will be provided. Please refer to "NOTES" tab for provider progress note.    Assessment & Plan:   Lyly was seen today for back pain.  Diagnoses and all orders for this visit:  Chronic, continuous use of opioids  Bilateral sciatica -     Lumbar Epidural Injection  Lumbar spondylosis with myelopathy -     Lumbar Epidural Injection  DDD (degenerative disc disease), lumbar -     Lumbar Epidural Injection  Spinal stenosis of lumbar region with neurogenic claudication -     Lumbar Epidural Injection  Chronic pain syndrome  Other orders -     triamcinolone acetonide (KENALOG-40) injection 40 mg -     sodium chloride flush (NS) 0.9 % injection 10 mL -     ropivacaine (PF) 2 mg/mL (0.2%) (NAROPIN) injection 10 mL -     lidocaine (PF) (XYLOCAINE) 1 % injection 5 mL -     midazolam (VERSED) 5 MG/5ML injection 2 mg -     iohexol (OMNIPAQUE) 180 MG/ML injection 10 mL -     HYDROcodone-acetaminophen (NORCO) 7.5-325 MG tablet; Take 1 tablet by mouth every 6 (six) hours as needed for moderate pain or severe pain. -     HYDROcodone-acetaminophen (NORCO) 7.5-325 MG tablet; Take 1 tablet by mouth every 6 (six) hours as needed for moderate pain or severe pain.        ----------------------------------------------------------------------------------------------------------------------  Problem List Items Addressed This Visit   None Visit Diagnoses     Chronic, continuous use of opioids    -  Primary   Bilateral sciatica       Relevant Medications   midazolam (VERSED) 5 MG/5ML injection 2 mg (Completed)   Lumbar spondylosis with myelopathy       Relevant Medications   triamcinolone acetonide (KENALOG-40)  injection 40 mg (Completed)   HYDROcodone-acetaminophen (NORCO) 7.5-325 MG tablet (Start on 07/02/2021)   HYDROcodone-acetaminophen (NORCO) 7.5-325 MG tablet (Start on 08/01/2021)   DDD (degenerative disc disease), lumbar       Relevant Medications   triamcinolone acetonide (KENALOG-40) injection 40 mg (Completed)   HYDROcodone-acetaminophen (NORCO) 7.5-325 MG tablet (Start on 07/02/2021)   HYDROcodone-acetaminophen (NORCO) 7.5-325 MG tablet (Start on 08/01/2021)   Spinal stenosis of lumbar region with neurogenic claudication       Relevant Medications   triamcinolone acetonide (KENALOG-40) injection 40 mg (Completed)   ropivacaine (PF) 2 mg/mL (0.2%) (NAROPIN) injection 10 mL (Completed)   lidocaine (PF) (XYLOCAINE) 1 % injection 5 mL (Completed)   HYDROcodone-acetaminophen (NORCO) 7.5-325 MG tablet (Start on 07/02/2021)   HYDROcodone-acetaminophen (NORCO) 7.5-325 MG tablet (Start on 08/01/2021)   Chronic pain syndrome       Relevant Medications   triamcinolone acetonide (KENALOG-40) injection 40 mg (Completed)   ropivacaine (PF) 2 mg/mL (0.2%) (NAROPIN) injection 10 mL (Completed)   lidocaine (PF) (XYLOCAINE) 1 % injection 5 mL (Completed)   HYDROcodone-acetaminophen (NORCO) 7.5-325 MG tablet (Start on 07/02/2021)   HYDROcodone-acetaminophen ([place]) 7.5-325 MG tablet (Start on 08/01/2021)         ----------------------------------------------------------------------------------------------------------------------  1. Bilateral sciatica We will proceed with a repeat epidural today.  We have gone over the risks and benefits of the procedure with her in full detail.  She is off of her Plavix for the appropriate amount of time.  We will have her restart Plavix tomorrow.  All her questions have been answered.  We will continue with her current medication management and no changes are made for that.  I encouraged her to continue efforts at weight loss and stretching strengthening as tolerated with return  to clinic in 2 months - Lumbar Epidural Injection  2. Lumbar spondylosis with myelopathy As above - Lumbar Epidural Injection  3. DDD (degenerative disc disease), lumbar As above - Lumbar Epidural Injection  4. Spinal stenosis of lumbar region with neurogenic claudication As above - Lumbar Epidural Injection  5. Chronic,  continuous use of opioids I have reviewed the [facility] practitioner database information and it is appropriate for refills.  We will refill her medications for the next 2 months today and she is to continue follow-up with her primary care physicians for baseline medical care.  6. Chronic pain syndrome As above    ----------------------------------------------------------------------------------------------------------------------  I am having Velora Heckler. Gracia start on HYDROcodone-acetaminophen. I am also having her maintain her tiotropium, traZODone, albuterol, albuterol, nitroGLYCERIN, levETIRAcetam, B-D UF III MINI PEN NEEDLES, clopidogrel, Lantus SoloStar, potassium chloride SA, ondansetron, torsemide, amLODipine, Ipratropium-Albuterol, atorvastatin, carvedilol, losartan, acetaminophen, isosorbide mononitrate, pantoprazole, sucralfate, and HYDROcodone-acetaminophen. We administered triamcinolone acetonide, sodium chloride flush, ropivacaine (PF) 2 mg/mL (0.2%), lidocaine (PF), midazolam, and iohexol.   Meds ordered this encounter  Medications   triamcinolone acetonide (KENALOG-40) injection 40 mg   sodium chloride flush (NS) 0.9 % injection 10 mL   ropivacaine (PF) 2 mg/mL (0.2%) (NAROPIN) injection 10 mL   lidocaine (PF) (XYLOCAINE) 1 % injection 5 mL   midazolam (VERSED) 5 MG/5ML injection 2 mg   iohexol (OMNIPAQUE) 180 MG/ML injection 10 mL   HYDROcodone-acetaminophen (NORCO) 7.5-325 MG tablet    Sig: Take 1 tablet by mouth every 6 (six) hours as needed for moderate pain or severe pain.    Dispense:  120 tablet    Refill:  0    HYDROcodone-acetaminophen (NORCO) 7.5-325 MG tablet    Sig: Take 1 tablet by mouth every 6 (six) hours as needed for moderate pain or severe pain.    Dispense:  120 tablet    Refill:  0   Patient's Medications  New Prescriptions   HYDROCODONE-ACETAMINOPHEN (NORCO) 7.5-325 MG TABLET    Take 1 tablet by mouth every 6 (six) hours as needed for moderate pain or severe pain.  Previous Medications   ACETAMINOPHEN (TYLENOL) 500 MG TABLET    Take 500 mg by mouth every 6 (six) hours as needed for headache, moderate pain or fever.   ALBUTEROL (PROVENTIL HFA;VENTOLIN HFA) 108 (90 BASE) MCG/ACT INHALER    Inhale 2 puffs into the lungs every 6 (six) hours as needed for wheezing or shortness of breath.   ALBUTEROL (PROVENTIL) (2.5 MG/3ML) 0.083% NEBULIZER SOLUTION    Take 2.5 mg every 4 (four) hours as needed by nebulization for wheezing or shortness of breath.   AMLODIPINE (NORVASC) 5 MG TABLET    Take 1 tablet (5 mg total) by mouth daily.   ATORVASTATIN (LIPITOR) 40 MG TABLET    Take 1 tablet (40 mg total) by mouth daily.   B-D UF III MINI PEN NEEDLES 31G X 5 MM MISC    USE WITH INSULIN PEN INJECTIONS TWICE DAILY   CARVEDILOL (COREG) 3.125 MG TABLET    Take 1 tablet (3.125 mg total) by mouth daily as needed.   CLOPIDOGREL (PLAVIX) 75 MG TABLET    Take 1 tablet (75 mg total) by mouth daily.   IPRATROPIUM-ALBUTEROL (COMBIVENT) 20-100 MCG/ACT AERS RESPIMAT    Inhale into the lungs.   ISOSORBIDE MONONITRATE (IMDUR) 30 MG 24 HR TABLET    Take 1 tablet (30 mg total) by mouth daily.   LANTUS SOLOSTAR 100 UNIT/ML SOLOSTAR PEN    Inject 30-40 Units into the skin See admin instructions. Inject 30 units subcutaneously in the morning at 40 units subcutaneously at bedtime.   LEVETIRACETAM (KEPPRA) 750 MG TABLET    Take 750 mg by mouth 2 (two) times daily.   LOSARTAN (COZAAR) 100 MG TABLET    Take 1 tablet (  100 mg total) by mouth daily.   NITROGLYCERIN (NITROSTAT) 0.4 MG SL TABLET    DISSOLVE ONE TABLET UNDER THE TONGUE  EVERY 5 MINUTES AS NEEDED FOR CHEST PAIN.  DO NOT EXCEED A TOTAL OF 3 DOSES IN 15 MINUTES   ONDANSETRON (ZOFRAN) 4 MG TABLET    Take 4 mg by mouth as needed.   PANTOPRAZOLE (PROTONIX) 40 MG TABLET    Take 1 tablet (40 mg total) by mouth 2 (two) times daily.   POTASSIUM CHLORIDE SA (KLOR-CON) 20 MEQ TABLET    Take 2 tablets (40 mEq total) by mouth 2 (two) times daily. Take extra 2 tablets (40 meq) if you take metolazone   SUCRALFATE (CARAFATE) 1 G TABLET    Take 1 tablet (1 g total) by mouth 4 (four) times daily as needed (for abdominal discomfort, nausea, and/or vomiting).   TIOTROPIUM (SPIRIVA) 18 MCG INHALATION CAPSULE    Place 18 mcg into inhaler and inhale daily.   TORSEMIDE (DEMADEX) 20 MG TABLET    Take 1 tablet (20 mg) by mouth twice daily   TRAZODONE (DESYREL) 50 MG TABLET    Take 50 mg by mouth at bedtime.  Modified Medications   Modified Medication Previous Medication   HYDROCODONE-ACETAMINOPHEN (NORCO) 7.5-325 MG TABLET HYDROcodone-acetaminophen (NORCO) 7.5-325 MG tablet      Take 1 tablet by mouth every 6 (six) hours as needed for moderate pain or severe pain.    Take 1 tablet by mouth every 6 (six) hours as needed for moderate pain or severe pain.  Discontinued Medications   No medications on file   ----------------------------------------------------------------------------------------------------------------------  Follow-up: Return in about 2 months (around 08/15/2021) for evaluation, med refill.   Procedure: L5-S1 LESI with fluoroscopic guidance and minimal sedation  NOTE: The risks, benefits, and expectations of the procedure have been discussed and explained to the patient who was understanding and in agreement with suggested treatment plan. No guarantees were made.  DESCRIPTION OF PROCEDURE: Lumbar epidural steroid injection with 2 mg IV Versed, EKG, blood pressure, pulse, and pulse oximetry monitoring. The procedure was performed with the patient in the prone position under  fluoroscopic guidance.  Sterile prep x3 was initiated and I then injected subcutaneous lidocaine to the overlying L5-S1 site after its fluoroscopic identifictation.  Using strict aseptic technique, I then advanced an 18-gauge Tuohy epidural needle in the midline using interlaminar approach via loss-of-resistance to saline technique. There was negative aspiration for heme or  CSF.  I then confirmed position with both AP and Lateral fluoroscan.  2 cc of contrast dye were injected and a  total of 5 mL of Preservative-Free normal saline mixed with 40 mg of Kenalog and 1cc Ropicaine 0.2 percent were injected incrementally via the  epidurally placed needle. The needle was removed. The patient tolerated the injection well and was convalesced and discharged to home in stable condition. Should the patient have any post procedure difficulty they have been instructed on how to contact us for assistance.    Molli Barrows, MD

## 2021-06-26 ENCOUNTER — Ambulatory Visit: Payer: Medicare HMO | Admitting: Cardiovascular Disease

## 2021-06-26 NOTE — Progress Notes (Unsigned)
Date:  06/26/2021   ID:  Tina Hayes, DOB Mar 04, 1946, MRN [id number]  Patient Location:  [address] [place] [address]   Provider location:   [facility], [place] office  PCP:  Crissie Figures, PA-C  Cardiologist:  [facility]  No chief complaint on file.    History of Present Illness:    Tina Hayes is a 76 y.o. 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, 24 to 76 yo  Chronic back pain,   HTN  seizure disorder  hospital admission 06/17/2016 for cough, chest pain, shortness of breath,  stress test showing no ischemia PAD: Ulceration right lower extremity, who presents  for follow-up of her chest pain, chronic diastolic CHF  Last seen in clinic by myself August 2022     Last seen by myself in clinic September 2021 Seen by one of our providers May 2022 On that visit memory poor, off balance, tired, no energy, poor sleep, GERD symptoms Was taking torsemide 20 twice a day, eating more,reds vest 34  March 2022 Lexiscan stress test that was low risk and normal with EF 55-60%  Followed by pulmonary  Echocardiogram April 2022 ejection fraction 55 to 60% Mild to moderate TR  In follow-up today she reports new issues: Food hanging up, spitting up Had esophagus stretched 3 years ago 04/2017 mild Schatzki ring (acquired) was found at the gastroesophageal junction. A guidewire was placed and the scope was withdrawn. Dilation was performed with a Savary dilator with mild resistance at 16 mm. GEJ about 35cm. A medium-sized hiatal hernia was present.  On PPI, does not remember the name  Was unable to get Nexium covered by insurance, primary care prescribed a different one  Presents today in a wheelchair but reports that her knees are fine after knee replacement surgery, able to go shopping  just fine  EKG personally reviewed by myself on todays visit Normal sinus rhythm rate 49 nonspecific ST-T wave abnormality 1 and aVL, anterolateral leads  Other studies reviewed on today's visit Underwent PV study March 2021 with Dr. Lucky Cowboy    Percutaneous transluminal angioplasty of right anterior tibial artery with 3 mm diameter angioplasty balloon      Percutaneous transluminal angioplasty of the right popliteal artery with 5 mm diameter Lutonix drug-coated angioplasty balloon     Viabahn stent placement to the right popliteal artery with 6 mm diameter   Echocardiogram October 2020 ejection fraction 60% Right heart pressures 47 moderately elevated moderate MR   hospital October 2018 for chest pain shortness of breath Felt to be atypical in nature, GI related, piece of meat stuck in her throat with pain radiating to both shoulders, .  Vomiting, unable to get any food or water down  Eventually piece of meat removed and she was able to swallow  history of GERD, she had ran out of her PPI   Prior CV studies:   The following studies were reviewed today:  Current Outpatient Medications on File Prior to Visit  Medication Sig Dispense Refill   acetaminophen (TYLENOL) 500 MG tablet Take 500 mg by mouth every 6 (six) hours as needed for headache, moderate pain or fever.     albuterol (PROVENTIL HFA;VENTOLIN HFA) 108 (90 Base) MCG/ACT inhaler Inhale 2 puffs into the lungs every  6 (six) hours as needed for wheezing or shortness of breath. 1 Inhaler 2   albuterol (PROVENTIL) (2.5 MG/3ML) 0.083% nebulizer solution Take 2.5 mg every 4 (four) hours as needed by nebulization for wheezing or shortness of breath.     amLODipine (NORVASC) 5 MG tablet Take 1 tablet (5 mg total) by mouth daily. 30 tablet 6   atorvastatin (LIPITOR) 40 MG tablet Take 1 tablet (40 mg total) by mouth daily. 90 tablet 1   B-D UF III MINI PEN NEEDLES 31G X 5 MM MISC USE WITH INSULIN PEN INJECTIONS TWICE DAILY     carvedilol  (COREG) 3.125 MG tablet Take 1 tablet (3.125 mg total) by mouth daily as needed. 90 tablet 3   clopidogrel (PLAVIX) 75 MG tablet Take 1 tablet (75 mg total) by mouth daily. 90 tablet 3   [START ON 07/02/2021] HYDROcodone-acetaminophen (NORCO) 7.5-325 MG tablet Take 1 tablet by mouth every 6 (six) hours as needed for moderate pain or severe pain. 120 tablet 0   [START ON 08/01/2021] HYDROcodone-acetaminophen (NORCO) 7.5-325 MG tablet Take 1 tablet by mouth every 6 (six) hours as needed for moderate pain or severe pain. 120 tablet 0   Ipratropium-Albuterol (COMBIVENT) 20-100 MCG/ACT AERS respimat Inhale into the lungs.     isosorbide mononitrate (IMDUR) 30 MG 24 hr tablet Take 1 tablet (30 mg total) by mouth daily. 180 tablet 3   LANTUS SOLOSTAR 100 UNIT/ML Solostar Pen Inject 30-40 Units into the skin See admin instructions. Inject 30 units subcutaneously in the morning at 40 units subcutaneously at bedtime.     levETIRAcetam (KEPPRA) 750 MG tablet Take 750 mg by mouth 2 (two) times daily.     losartan (COZAAR) 100 MG tablet Take 1 tablet (100 mg total) by mouth daily. 90 tablet 3   nitroGLYCERIN (NITROSTAT) 0.4 MG SL tablet DISSOLVE ONE TABLET UNDER THE TONGUE EVERY 5 MINUTES AS NEEDED FOR CHEST PAIN.  DO NOT EXCEED A TOTAL OF 3 DOSES IN 15 MINUTES 25 tablet 0   ondansetron (ZOFRAN) 4 MG tablet Take 4 mg by mouth as needed.     pantoprazole (PROTONIX) 40 MG tablet Take 1 tablet (40 mg total) by mouth 2 (two) times daily. 60 tablet 1   potassium chloride SA (KLOR-CON) 20 MEQ tablet Take 2 tablets (40 mEq total) by mouth 2 (two) times daily. Take extra 2 tablets (40 meq) if you take metolazone 140 tablet 5   sucralfate (CARAFATE) 1 g tablet Take 1 tablet (1 g total) by mouth 4 (four) times daily as needed (for abdominal discomfort, nausea, and/or vomiting). 30 tablet 1   tiotropium (SPIRIVA) 18 MCG inhalation capsule Place 18 mcg into inhaler and inhale daily.     torsemide (DEMADEX) 20 MG tablet Take 1  tablet (20 mg) by mouth twice daily     traZODone (DESYREL) 50 MG tablet Take 50 mg by mouth at bedtime.     No current facility-administered medications on file prior to visit.     Past Medical History:  Diagnosis Date   (HFpEF) heart failure with preserved ejection fraction ([place])    a. 05/2016 Echo: EF 60-65%, mild to mod LVH, Gr1 DD, mild MR, mildly dil LA, mod TR, mildly to mod increased PASP; b. 07/2020 Echo: EF 55-60%, no rwma, mild LVH, GrIDD, mildly dil LA, mild MR, mild-mod TR, mild-mod AoV sclerosis w/o stenosis.   Anxiety    Arthritis    Chronic back pain    COPD (chronic obstructive pulmonary disease) (  [place])    Coronary artery disease    a. s/p remote PCI x 5;  b. 2006 s/p CABG x 3 ([place]); b. 05/2016 MV: attenuation corrected images w/o ischemia or wma-->Med rx; c. 06/2020 MV: EF 55-65%, no isch/infart-->low risk.   Depression    Diabetes mellitus without complication ([place])    Essential hypertension 06/30/2016   GERD (gastroesophageal reflux disease)    Heart attack ([place])    Total of 3 per pt.   Hyperlipidemia LDL goal <55    Hypertensive urgency 06/03/2015   PAD (peripheral artery disease) ([place])    a. 06/2019 Angio: R AT (PTA), R POP (PTA & Viabahn stenting); b. 09/2020 ABIs: R 1.24, L 1.35.   Palpitations    a. 06/2020 Zio: Sinus brady, 54 (40-143), 4 beats NSVT, 3 SVT runs (max 143 bpm x 4 beats).   Seizure [facility])    Past Surgical History:  Procedure Laterality Date   ABDOMINAL HYSTERECTOMY     ANKLE SURGERY Right    CATARACT EXTRACTION W/ INTRAOCULAR LENS  IMPLANT, BILATERAL     COLONOSCOPY WITH PROPOFOL N/A 05/04/2017   Procedure: COLONOSCOPY WITH PROPOFOL;  Surgeon: [name], MD;  Location: [facility] ENDOSCOPY;  Service: Endoscopy;  Laterality: N/A;   CORONARY ANGIOPLASTY     CORONARY ARTERY BYPASS GRAFT     TRIPLE BYPASS   ESOPHAGOGASTRODUODENOSCOPY N/A 02/25/2021   Procedure: ESOPHAGOGASTRODUODENOSCOPY (EGD);  Surgeon: Lin Landsman, MD;  Location: [facility] ENDOSCOPY;  Service: Gastroenterology;  Laterality: N/A;   ESOPHAGOGASTRODUODENOSCOPY (EGD) WITH PROPOFOL N/A 05/04/2017   Procedure: ESOPHAGOGASTRODUODENOSCOPY (EGD) WITH PROPOFOL;  Surgeon: [name], MD;  Location[facility] ENDOSCOPY;  Service: Endoscopy;  Laterality: N/A;   EXCISION MASS LOWER EXTREMETIES Right 03/02/2018   Procedure: EXCISION SOFT TISSUE MASS FROM MEDIAL ASPECT OF RIGHT ANKLE;  Surgeon: [name], MD;  Location: [facility] ORS;  Service: Orthopedics;  Laterality: Right;   FRACTURE SURGERY     JOINT REPLACEMENT     right   KNEE SURGERY Right    LOWER EXTREMITY ANGIOGRAPHY Right 07/14/2019   Procedure: LOWER EXTREMITY ANGIOGRAPHY;  Surgeon: Algernon Huxley, MD;  Location: [place] CV LAB;  Service: Cardiovascular;  Laterality: Right;   MOUTH SURGERY Left 07/14/2017   TOTAL KNEE ARTHROPLASTY Left 09/27/2019   Procedure: TOTAL KNEE ARTHROPLASTY;  Surgeon: [name], MD;  Location: [facility] ORS;  Service: Orthopedics;  Laterality: Left;     No outpatient medications have been marked as taking for the 06/26/21 encounter (Appointment) with Minna Merritts, MD.     Allergies:   Aspirin   Social History   Tobacco Use   Smoking status: Former    Packs/day: 0.25    Years: 20.00    Pack years: 5.00    Types: Cigarettes    Quit date: 07/01/2006    Years since quitting: 14.9   Smokeless tobacco: Never  Vaping Use   Vaping Use: Never used  Substance Use Topics   Alcohol use: No    Alcohol/week: 0.0 standard drinks   Drug use: No     Family Hx: The patient's family history includes Cancer in her sister; Depression in her mother; Diabetes in her brother, mother, and another family member; Heart attack in her mother and sister; Heart disease in her mother; Heart failure in her mother and sister; Hypertension in her brother, mother, sister, and another family member; SIDS in her sister; Stroke in her mother.  ROS:   Please  see the history  of present illness.    Review of Systems  Constitutional: Negative.   Respiratory: Negative.    Cardiovascular: Negative.   Gastrointestinal: Negative.   Musculoskeletal:  Positive for joint pain.  Neurological: Negative.   Psychiatric/Behavioral: Negative.    All other systems reviewed and are negative.   Labs/Other Tests and Data Reviewed:    Recent Labs: 07/13/2020: TSH 2.640 02/25/2021: Magnesium 1.8 05/28/2021: ALT 11; BUN 14; Creatinine, Ser 0.83; Hemoglobin 9.7; Platelets 254; Potassium 4.0; Sodium 139   Recent Lipid Panel Lab Results  Component Value Date/Time   CHOL 120 01/30/2017 04:04 AM   TRIG 73 01/30/2017 04:04 AM   HDL 47 01/30/2017 04:04 AM   CHOLHDL 2.6 01/30/2017 04:04 AM   LDLCALC 58 01/30/2017 04:04 AM    Wt Readings from Last 3 Encounters:  06/17/21 260 lb (117.9 kg)  05/28/21 260 lb (117.9 kg)  02/24/21 260 lb (117.9 kg)     Exam:    Vital Signs: Vital signs may also be detailed in the HPI There were no vitals taken for this visit.   Constitutional:  oriented to person, place, and time. No distress.  Obese, in a wheelchair HENT:  Head: Grossly normal Eyes:  no discharge. No scleral icterus.  Neck: No JVD, no carotid bruits  Cardiovascular: Regular rate and rhythm, no murmurs appreciated Pulmonary/Chest: Clear to auscultation bilaterally, no wheezes or rails Abdominal: Soft.  no distension.  no tenderness.  Musculoskeletal: Normal range of motion Neurological:  normal muscle tone. Coordination normal. No atrophy Skin: Skin warm and dry Psychiatric: normal affect, pleasant   ASSESSMENT & PLAN:    Chronic diastolic congestive heart failure (HCC) Euvolemic on today's visit, no changes to her medications  Type 2 diabetes mellitus without complication, with long-term current use of insulin (HCC) Weight trending upwards We have encouraged continued exercise, careful diet management in an effort to lose weight.  Atypical chest  discomfort Further discussion with her, moderate size hiatal hernia noted 2019 with a ring requiring dilation Has not had further GI work-up since then but reports her symptoms are back, food hanging up, sticking, spitting up, unable to eat pieces of meat -CT attenuation correction images from stress test shows at least moderate size hiatal hernia if not bigger -At her request referral placed to GI, may need EGD, unclear if she needs dilation for recurrent ring, we did discuss options for hiatal hernia but will defer to GI She is interested in having this corrected given severity of her symptoms  Coronary artery disease of native artery of native heart with stable angina pectoris ([place]) Currently with no symptoms of angina. No further workup at this time. Continue current medication regimen. Recent stress test low risk Non-smoker, cholesterol at goal  Essential hypertension Blood pressure is well controlled on today's visit. No changes made to the medications.  Morbid obesity ([place]) We have encouraged continued exercise, careful diet management in an effort to lose weight.  Centrilobular emphysema ([place]) Prior smoking, stopped years ago On inhalers per primary care   Total encounter time more than 25 minutes  Greater than 50% was spent in counseling and coordination of care with the patient    Signed, Ida Rogue, MD  06/26/2021 7:46 AM    [place] [facility] [id number], [place], [place] [address]

## 2021-07-16 ENCOUNTER — Other Ambulatory Visit (HOSPITAL_COMMUNITY): Payer: Self-pay | Admitting: Pulmonary Disease

## 2021-07-22 ENCOUNTER — Ambulatory Visit: Payer: Medicare HMO | Admitting: Medical

## 2021-07-22 NOTE — Progress Notes (Deleted)
?Cardiology Office Note:   ? ?Date:  07/22/2021  ? ?ID:  Tina Hayes, DOB 04/24/1946, MRN [id number] ? ?PCP:  Crissie Figures, PA-C  ?[facility] Cardiologist:  Ida Rogue, MD  ?[place] Electrophysiologist:  None  ? ?Referring MD: Herminio Commons, MD  ? ?Chief Complaint: 6 month follow-up ? ?History of Present Illness:   ? ?Tina Hayes is a 76 y.o. 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  ?  ?The patient was seen in 2018 for intermittent chest and shoulder pain and tenderness for many months, which turned out to be GI issues. Since then she has been followed by the office. Echo 01/2019 showed EF 60%, mildly dilated right heart, moderate MR.  ?  ?Seen in the ER 07/02/20 for chest pain, low suspicion for ACS. She had run out of her PPI. Cardiac work-up was negative, suspected acid-relfux and Nexium was refilled.  ?  ?Patient seen 07/13/20 and reported lightheadedness, dizziness, and chest pressure. EKG showed bradycardia and coreg was discontinued. Echo and heart monitor ordered. Echo showed normal LV function, mild LVH, G1DD, mild to mod AS. Heart monitor showed SB, brief SVT and NSVT. Orthostatics negative. There was question of metolazone and torsemide dosing. Previous labs showed she was dry. Given normal EF and labs, metolazone was discontinued.  ? ?Patient underwent MPI 08/2020 for atypical chest pain. This was low risk with normal EF 55-60%.  ? ?Last seen 11/2020 and reported atypical chest pain. Hiatal hernia was noted and she was referred to GI.  ? ?Today,  ? ? ? ?Past Medical History:  ?Diagnosis Date  ? (HFpEF) heart failure with preserved ejection fraction ([place])   ? a. 05/2016 Echo: EF 60-65%, mild to mod LVH, Gr1 DD, mild MR, mildly dil LA, mod TR, mildly to mod increased PASP; b. 07/2020 Echo: EF 55-60%, no rwma, mild LVH, GrIDD,  mildly dil LA, mild MR, mild-mod TR, mild-mod AoV sclerosis w/o stenosis.  ? Anxiety   ? Arthritis   ? Chronic back pain   ? COPD (chronic obstructive pulmonary disease) ([place])   ? Coronary artery disease   ? a. s/p remote PCI x 5;  b. 2006 s/p CABG x 3 ([place], [place]); b. 05/2016 MV: attenuation corrected images w/o ischemia or wma-->Med rx; c. 06/2020 MV: EF 55-65%, no isch/infart-->low risk.  ? Depression   ? Diabetes mellitus without complication ([place])   ? Essential hypertension 06/30/2016  ? GERD (gastroesophageal reflux disease)   ? Heart attack ([place])   ? Total of 3 per pt.  ? Hyperlipidemia LDL goal <55   ? Hypertensive urgency 06/03/2015  ? PAD (peripheral artery disease) ([place])   ? a. 06/2019 Angio: R AT (PTA), R POP (PTA & Viabahn stenting); b. 09/2020 ABIs: R 1.24, L 1.35.  ? Palpitations   ? a. 06/2020 Zio: Sinus brady, 54 (40-143), 4 beats NSVT, 3 SVT runs (max 143 bpm x 4 beats).  ? Seizure ([place])   ? ? ?Past Surgical History:  ?Procedure Laterality Date  ? ABDOMINAL HYSTERECTOMY    ? ANKLE SURGERY Right   ? CATARACT EXTRACTION W/ INTRAOCULAR LENS  IMPLANT, BILATERAL    ? COLONOSCOPY WITH PROPOFOL N/A 05/04/2017  ? Procedure: COLONOSCOPY WITH PROPOFOL;  Surgeon: [name], MD;  Location: [facility] ENDOSCOPY;  Service: Endoscopy;  Laterality: N/A;  ? CORONARY ANGIOPLASTY    ? CORONARY ARTERY BYPASS GRAFT    ? TRIPLE BYPASS  ? ESOPHAGOGASTRODUODENOSCOPY N/A 02/25/2021  ? Procedure: ESOPHAGOGASTRODUODENOSCOPY (EGD);  Surgeon: Lin Landsman, MD;  Location: [facility] ENDOSCOPY;  Service: Gastroenterology;  Laterality: N/A;  ? ESOPHAGOGASTRODUODENOSCOPY (EGD) WITH PROPOFOL N/A 05/04/2017  ? Procedure: ESOPHAGOGASTRODUODENOSCOPY (EGD) WITH PROPOFOL;  Surgeon: [name], MD;  Location[facility] ENDOSCOPY;  Service: Endoscopy;  Laterality: N/A;  ? EXCISION MASS LOWER EXTREMETIES Right 03/02/2018  ? Procedure: EXCISION SOFT TISSUE MASS FROM MEDIAL ASPECT OF RIGHT ANKLE;  Surgeon: [name], MD;  Location: [facility] ORS;  Service: Orthopedics;  Laterality: Right;  ? FRACTURE SURGERY    ? JOINT REPLACEMENT    ? right  ? KNEE SURGERY Right   ? LOWER EXTREMITY ANGIOGRAPHY Right 07/14/2019  ? Procedure: LOWER EXTREMITY ANGIOGRAPHY;  Surgeon: Algernon Huxley, MD;  Location: [place] CV LAB;  Service: Cardiovascular;  Laterality: Right;  ? MOUTH SURGERY Left 07/14/2017  ? TOTAL KNEE ARTHROPLASTY Left 09/27/2019  ? Procedure: TOTAL KNEE ARTHROPLASTY;  Surgeon: [name], MD;  Location: [facility] ORS;  Service: Orthopedics;  Laterality: Left;  ? ? ?Current Medications: ?No outpatient medications have been marked as taking for the 07/22/21 encounter (Appointment) with Kathlen Mody, Eleana Tocco H, PA-C.  ?  ? ?Allergies:   Aspirin  ? ?Social History  ? ?Socioeconomic History  ? Marital status: Widowed  ?  Spouse name: Not on file  ? Number of children: Not on file  ? Years of education: Not on file  ? Highest education level: Not on file  ?Occupational History  ? Not on file  ?Tobacco Use  ? Smoking status: Former  ?  Packs/day: 0.25  ?  Years: 20.00  ?  Pack years: 5.00  ?  Types: Cigarettes  ?  Quit date: 07/01/2006  ?  Years since quitting: 15.0  ? Smokeless tobacco: Never  ?Vaping Use  ? Vaping Use: Never used  ?Substance and Sexual Activity  ? Alcohol use: No  ?  Alcohol/week: 0.0 standard drinks  ? Drug use: No  ? Sexual activity: Yes  ?Other Topics Concern  ? Not on file  ?Social History Narrative  ? Lives locally.  Does not routinely exercise.  ? ?Social Determinants of Health  ? ?Financial Resource Strain: Not on file  ?Food Insecurity: Not on file  ?Transportation Needs: Not on file  ?Physical Activity: Not on file  ?Stress: Not on file  ?Social Connections: Not on file  ?  ? ?Family History: ?The patient's family history includes Cancer in her sister; Depression in her mother; Diabetes in her brother, mother, and another family member; Heart attack in her mother and sister; Heart disease in her mother; Heart  failure in her mother and sister; Hypertension in her brother, mother, sister, and another family member; SIDS in her sister; Stroke in her mother. ? ?ROS:   ?Please see the history of present illness.    ? All other systems reviewed and are negative. ? ?EKGs/Labs/Other Studies Reviewed:   ? ?The following studies were reviewed today: ? ?Echo 08/02/20 ? 1. Left ventricular ejection fraction, by estimation, is 55 to 60%. Left  ?ventricular ejection fraction by 3D volume is 58 %. The left ventricle has  ?normal function. The left ventricle has no regional wall motion  ?abnormalities. There is mild left  ?ventricular hypertrophy. Left ventricular diastolic parameters are  ?consistent with Grade I diastolic dysfunction (  impaired relaxation).  ? 2. Right ventricular systolic function is normal. The right ventricular  ?size is normal. There is normal pulmonary artery systolic pressure.  ? 3. Left atrial size was mildly dilated.  ? 4. The mitral valve is grossly normal. Mild mitral valve regurgitation.  ? 5. Tricuspid valve regurgitation is mild to moderate.  ? 6. The aortic valve is tricuspid. Aortic valve regurgitation is not  ?visualized. Mild to moderate aortic valve sclerosis/calcification is  ?present, without any evidence of aortic stenosis.  ? 7. The inferior vena cava is normal in size with greater than 50%  ?respiratory variability, suggesting right atrial pressure of 3 mmHg.  ? ?MPI 06/2020 ?T wave inversion was noted during stress. ?The study is normal. ?This is a low risk study. ?The left ventricular ejection fraction is normal (55-65%). ?CT attenuation images showed significant aortic and coronary calcifications. ?  ?Heart monitor ?  ?Patch Wear Time:  11 days and 3 hours (2022-03-18T11:58:31-0400 to 2022-03-29T15:13:51-0400) ?  ?Patient had a min HR of 40 bpm, max HR of 143 bpm, and avg HR of 54 bpm. Predominant underlying rhythm was Sinus Rhythm. 1 run of Ventricular Tachycardia occurred lasting 4 beats with a  max rate of 141 bpm (avg 127 bpm). 3 Supraventricular Tachycardia  ?runs occurred, the run with the fastest interval lasting 4 beats with a max rate of 143 bpm, the longest lasting 4 beats with an avg rate

## 2021-07-23 ENCOUNTER — Encounter: Payer: Self-pay | Admitting: Medical

## 2021-08-01 ENCOUNTER — Other Ambulatory Visit: Payer: Self-pay | Admitting: Cardiovascular Disease

## 2021-08-01 NOTE — Telephone Encounter (Signed)
Please contact pt for future appointment. Pt overdue for 6 month f/u. Pt needing refills. 

## 2021-08-07 ENCOUNTER — Encounter: Payer: Self-pay | Admitting: Anesthesiology

## 2021-08-07 ENCOUNTER — Ambulatory Visit: Payer: Medicare HMO | Attending: Anesthesiology | Admitting: Anesthesiology

## 2021-08-07 DIAGNOSIS — M5136 Other intervertebral disc degeneration, lumbar region: Secondary | ICD-10-CM

## 2021-08-07 DIAGNOSIS — G4486 Cervicogenic headache: Secondary | ICD-10-CM

## 2021-08-07 DIAGNOSIS — M48062 Spinal stenosis, lumbar region with neurogenic claudication: Secondary | ICD-10-CM

## 2021-08-07 DIAGNOSIS — M5431 Sciatica, right side: Secondary | ICD-10-CM | POA: Diagnosis not present

## 2021-08-07 DIAGNOSIS — G894 Chronic pain syndrome: Secondary | ICD-10-CM

## 2021-08-07 DIAGNOSIS — M4716 Other spondylosis with myelopathy, lumbar region: Secondary | ICD-10-CM

## 2021-08-07 DIAGNOSIS — M47817 Spondylosis without myelopathy or radiculopathy, lumbosacral region: Secondary | ICD-10-CM

## 2021-08-07 DIAGNOSIS — F1190 Opioid use, unspecified, uncomplicated: Secondary | ICD-10-CM

## 2021-08-07 DIAGNOSIS — M5432 Sciatica, left side: Secondary | ICD-10-CM

## 2021-08-08 ENCOUNTER — Other Ambulatory Visit: Payer: Self-pay

## 2021-08-08 MED ORDER — ATORVASTATIN CALCIUM 40 MG PO TABS: 40.0000 mg | ORAL_TABLET | Freq: Every day | ORAL | 0 refills | Status: DC

## 2021-08-09 ENCOUNTER — Other Ambulatory Visit: Payer: Self-pay | Admitting: Physician Assistant

## 2021-08-09 ENCOUNTER — Other Ambulatory Visit: Payer: Self-pay

## 2021-08-09 ENCOUNTER — Other Ambulatory Visit (HOSPITAL_COMMUNITY): Payer: Self-pay | Admitting: Physician Assistant

## 2021-08-09 ENCOUNTER — Emergency Department
Admission: EM | Admit: 2021-08-09 | Discharge: 2021-08-09 | Disposition: A | Discharge status: Home / Self Care | Payer: Medicare HMO | Attending: Emergency Medicine | Admitting: Emergency Medicine

## 2021-08-09 ENCOUNTER — Emergency Department: Payer: Medicare HMO

## 2021-08-09 DIAGNOSIS — I509 Heart failure, unspecified: Secondary | ICD-10-CM | POA: Diagnosis not present

## 2021-08-09 DIAGNOSIS — S7002XA Contusion of left hip, initial encounter: Secondary | ICD-10-CM | POA: Insufficient documentation

## 2021-08-09 DIAGNOSIS — S0990XA Unspecified injury of head, initial encounter: Secondary | ICD-10-CM | POA: Diagnosis not present

## 2021-08-09 DIAGNOSIS — R519 Headache, unspecified: Secondary | ICD-10-CM | POA: Insufficient documentation

## 2021-08-09 DIAGNOSIS — J449 Chronic obstructive pulmonary disease, unspecified: Secondary | ICD-10-CM | POA: Diagnosis not present

## 2021-08-09 DIAGNOSIS — W01198A Fall on same level from slipping, tripping and stumbling with subsequent striking against other object, initial encounter: Secondary | ICD-10-CM | POA: Diagnosis not present

## 2021-08-09 DIAGNOSIS — I2510 Atherosclerotic heart disease of native coronary artery without angina pectoris: Secondary | ICD-10-CM | POA: Insufficient documentation

## 2021-08-09 DIAGNOSIS — W19XXXA Unspecified fall, initial encounter: Secondary | ICD-10-CM

## 2021-08-09 DIAGNOSIS — I110 Hypertensive heart disease with heart failure: Secondary | ICD-10-CM | POA: Diagnosis not present

## 2021-08-09 DIAGNOSIS — H539 Unspecified visual disturbance: Secondary | ICD-10-CM

## 2021-08-09 DIAGNOSIS — S199XXA Unspecified injury of neck, initial encounter: Secondary | ICD-10-CM | POA: Diagnosis present

## 2021-08-09 DIAGNOSIS — S161XXA Strain of muscle, fascia and tendon at neck level, initial encounter: Secondary | ICD-10-CM | POA: Insufficient documentation

## 2021-08-09 DIAGNOSIS — I10 Essential (primary) hypertension: Secondary | ICD-10-CM

## 2021-08-09 DIAGNOSIS — R739 Hyperglycemia, unspecified: Secondary | ICD-10-CM

## 2021-08-09 DIAGNOSIS — E1165 Type 2 diabetes mellitus with hyperglycemia: Secondary | ICD-10-CM | POA: Diagnosis not present

## 2021-08-09 DIAGNOSIS — R569 Unspecified convulsions: Secondary | ICD-10-CM

## 2021-08-09 LAB — COMPREHENSIVE METABOLIC PANEL
ALT: 15 U/L (ref 0–44)
AST: 22 U/L (ref 15–41)
Albumin: 3.5 g/dL (ref 3.5–5.0)
Alkaline Phosphatase: 95 U/L (ref 38–126)
Anion gap: 8 (ref 5–15)
BUN: 13 mg/dL (ref 8–23)
CO2: 26 mmol/L (ref 22–32)
Calcium: 8.7 mg/dL — ABNORMAL LOW (ref 8.9–10.3)
Chloride: 108 mmol/L (ref 98–111)
Creatinine, Ser: 0.78 mg/dL (ref 0.44–1.00)
GFR, Estimated: >60 mL/min (ref >60)
Glucose, Bld: 232 mg/dL — ABNORMAL HIGH (ref 70–99)
Potassium: 4.4 mmol/L (ref 3.5–5.1)
Sodium: 142 mmol/L (ref 135–145)
Total Bilirubin: 0.8 mg/dL (ref 0.3–1.2)
Total Protein: 7 g/dL (ref 6.5–8.1)

## 2021-08-09 LAB — CBC WITH DIFFERENTIAL/PLATELET
Abs Immature Granulocytes: 0.01 10*3/uL (ref 0.00–0.07)
Basophils Absolute: 0 10*3/uL (ref 0.0–0.1)
Basophils Relative: 1 %
Eosinophils Absolute: 0.3 10*3/uL (ref 0.0–0.5)
Eosinophils Relative: 6 %
HCT: 29.7 % — ABNORMAL LOW (ref 36.0–46.0)
Hemoglobin: 8.8 g/dL — ABNORMAL LOW (ref 12.0–15.0)
Immature Granulocytes: 0 %
Lymphocytes Relative: 33 %
Lymphs Abs: 1.3 10*3/uL (ref 0.7–4.0)
MCH: 25.6 pg — ABNORMAL LOW (ref 26.0–34.0)
MCHC: 29.6 g/dL — ABNORMAL LOW (ref 30.0–36.0)
MCV: 86.3 fL (ref 80.0–100.0)
Monocytes Absolute: 0.3 10*3/uL (ref 0.1–1.0)
Monocytes Relative: 8 %
Neutro Abs: 2 10*3/uL (ref 1.7–7.7)
Neutrophils Relative %: 52 %
Platelets: 178 10*3/uL (ref 150–400)
RBC: 3.44 MIL/uL — ABNORMAL LOW (ref 3.87–5.11)
RDW: 15 % (ref 11.5–15.5)
WBC: 4 10*3/uL (ref 4.0–10.5)
nRBC: 0 % (ref 0.0–0.2)

## 2021-08-09 LAB — PROTIME-INR
INR: 1 (ref 0.8–1.2)
Prothrombin Time: 13.5 seconds (ref 11.4–15.2)

## 2021-08-09 LAB — TROPONIN I (HIGH SENSITIVITY): Troponin I (High Sensitivity): 6 ng/L

## 2021-08-09 MED ORDER — OXYCODONE-ACETAMINOPHEN 5-325 MG PO TABS
1.0000 | ORAL_TABLET | Freq: Once | ORAL | Status: DC
Start: 2021-08-09 — End: 2021-08-09
  Filled 2021-08-09: qty 1

## 2021-08-09 MED ORDER — FENTANYL CITRATE PF 50 MCG/ML IJ SOSY
50.0000 ug | PREFILLED_SYRINGE | Freq: Once | INTRAMUSCULAR | Status: AC
  Administered 2021-08-09: 50 ug via INTRAVENOUS
  Filled 2021-08-09: qty 1

## 2021-08-09 MED ORDER — HYDROCODONE-ACETAMINOPHEN 7.5-325 MG PO TABS: 1.0000 | ORAL_TABLET | Freq: Four times a day (QID) | ORAL | 0 refills | Status: DC | PRN

## 2021-08-09 MED ORDER — HYDROCODONE-ACETAMINOPHEN 7.5-325 MG PO TABS: 1.0000 | ORAL_TABLET | Freq: Four times a day (QID) | ORAL | 0 refills | Status: AC | PRN

## 2021-08-09 NOTE — ED Notes (Signed)
Pt dc ppw provided. Pt denies any questions. Pt follow up reviewed./ pt provides verbal consent to DC. Pt assisted to lobby via wheelchair ?

## 2021-08-09 NOTE — ED Notes (Signed)
Pt calling family for ride 

## 2021-08-09 NOTE — Progress Notes (Signed)
Virtual Visit via Telephone Note ? ?I connected with Tina Hayes on 08/09/21 at 11:15 AM EDT by telephone and verified that I am speaking with the correct person using two identifiers. ? ?Location: ?Patient: Home ?Provider: [facility] control center ?  ?I discussed the limitations, risks, security and privacy concerns of performing an evaluation and management service by telephone and the availability of in person appointments. I also discussed with the patient that there may be a patient responsible charge related to this service. The patient expressed understanding and agreed to proceed. ? ? ?History of Present Illness: ? ?I was able to catch up with Tina Hayes via telephone as she was unable to do the video portion of the conference.  She states that she has been doing well following her epidural.  She got good relief from her low back pain and leg pain and has generally been feeling better.  She states she did suffer a recent fall approximately 4 days ago and this is caused some mild recurrence of her pain but it is also worsened her headaches.  These have been chronic in nature and she is due to see her neurologist for evaluation on Tuesday of next week.  She maintains that she has suffered from chronic headaches but these have been worse recently and during her conversation she denies any loss of consciousness or hit to the head sustained during the fall.  She denies side effects with her opioid medications as well including dizziness or fatigue associated with the medications.  The quality characteristic and distribution of her low back pain and leg pain are stable but less severe at this point. ? ?Review of systems: ?General: No fevers or chills ?Pulmonary: No shortness of breath or dyspnea ?Cardiac: No angina or palpitations or lightheadedness ?GI: No abdominal pain or constipation ?Psych: No depression  ?Observations/Objective: ? ? ?Current Outpatient Medications:  ?  [START ON 09/30/2021]  HYDROcodone-acetaminophen (NORCO) 7.5-325 MG tablet, Take 1 tablet by mouth every 6 (six) hours as needed for moderate pain or severe pain., Disp: 120 tablet, Rfl: 0 ?  acetaminophen (TYLENOL) 500 MG tablet, Take 500 mg by mouth every 6 (six) hours as needed for headache, moderate pain or fever., Disp: , Rfl:  ?  albuterol (PROVENTIL HFA;VENTOLIN HFA) 108 (90 Base) MCG/ACT inhaler, Inhale 2 puffs into the lungs every 6 (six) hours as needed for wheezing or shortness of breath., Disp: 1 Inhaler, Rfl: 2 ?  albuterol (PROVENTIL) (2.5 MG/3ML) 0.083% nebulizer solution, Take 2.5 mg every 4 (four) hours as needed by nebulization for wheezing or shortness of breath., Disp: , Rfl:  ?  amLODipine (NORVASC) 5 MG tablet, Take 1 tablet (5 mg total) by mouth daily., Disp: 30 tablet, Rfl: 6 ?  atorvastatin (LIPITOR) 40 MG tablet, Take 1 tablet (40 mg total) by mouth daily., Disp: 30 tablet, Rfl: 0 ?  B-D UF III MINI PEN NEEDLES 31G X 5 MM MISC, USE WITH INSULIN PEN INJECTIONS TWICE DAILY, Disp: , Rfl:  ?  carvedilol (COREG) 3.125 MG tablet, Take 1 tablet (3.125 mg total) by mouth daily as needed., Disp: 90 tablet, Rfl: 3 ?  clopidogrel (PLAVIX) 75 MG tablet, Take 1 tablet (75 mg total) by mouth daily., Disp: 90 tablet, Rfl: 3 ?  [START ON 08/31/2021] HYDROcodone-acetaminophen (NORCO) 7.5-325 MG tablet, Take 1 tablet by mouth every 6 (six) hours as needed for moderate pain or severe pain., Disp: 120 tablet, Rfl: 0 ?  Ipratropium-Albuterol (COMBIVENT) 20-100 MCG/ACT AERS respimat, Inhale into  the lungs., Disp: , Rfl:  ?  isosorbide mononitrate (IMDUR) 30 MG 24 hr tablet, Take 1 tablet (30 mg total) by mouth daily., Disp: 180 tablet, Rfl: 3 ?  LANTUS SOLOSTAR 100 UNIT/ML Solostar Pen, Inject 30-40 Units into the skin See admin instructions. Inject 30 units subcutaneously in the morning at 40 units subcutaneously at bedtime., Disp: , Rfl:  ?  levETIRAcetam (KEPPRA) 750 MG tablet, Take 750 mg by mouth 2 (two) times daily., Disp: , Rfl:   ?  losartan (COZAAR) 100 MG tablet, Take 1 tablet (100 mg total) by mouth daily., Disp: 90 tablet, Rfl: 3 ?  nitroGLYCERIN (NITROSTAT) 0.4 MG SL tablet, DISSOLVE ONE TABLET UNDER THE TONGUE EVERY 5 MINUTES AS NEEDED FOR CHEST PAIN.  DO NOT EXCEED A TOTAL OF 3 DOSES IN 15 MINUTES, Disp: 25 tablet, Rfl: 0 ?  ondansetron (ZOFRAN) 4 MG tablet, Take 4 mg by mouth as needed., Disp: , Rfl:  ?  pantoprazole (PROTONIX) 40 MG tablet, Take 1 tablet (40 mg total) by mouth 2 (two) times daily., Disp: 60 tablet, Rfl: 1 ?  potassium chloride SA (KLOR-CON) 20 MEQ tablet, Take 2 tablets (40 mEq total) by mouth 2 (two) times daily. Take extra 2 tablets (40 meq) if you take metolazone, Disp: 140 tablet, Rfl: 5 ?  sucralfate (CARAFATE) 1 g tablet, Take 1 tablet (1 g total) by mouth 4 (four) times daily as needed (for abdominal discomfort, nausea, and/or vomiting)., Disp: 30 tablet, Rfl: 1 ?  tiotropium (SPIRIVA) 18 MCG inhalation capsule, Place 18 mcg into inhaler and inhale daily., Disp: , Rfl:  ?  torsemide (DEMADEX) 20 MG tablet, Take 1 tablet (20 mg) by mouth twice daily, Disp: , Rfl:  ?  traZODone (DESYREL) 50 MG tablet, Take 50 mg by mouth at bedtime., Disp: , Rfl:   ? ?Past Medical History:  ?Diagnosis Date  ? (HFpEF) heart failure with preserved ejection fraction ([place])   ? a. 05/2016 Echo: EF 60-65%, mild to mod LVH, Gr1 DD, mild MR, mildly dil LA, mod TR, mildly to mod increased PASP; b. 07/2020 Echo: EF 55-60%, no rwma, mild LVH, GrIDD, mildly dil LA, mild MR, mild-mod TR, mild-mod AoV sclerosis w/o stenosis.  ? Anxiety   ? Arthritis   ? Chronic back pain   ? COPD (chronic obstructive pulmonary disease) ([place])   ? Coronary artery disease   ? a. s/p remote PCI x 5;  b. 2006 s/p CABG x 3 ([place], [place]); b. 05/2016 MV: attenuation corrected images w/o ischemia or wma-->Med rx; c. 06/2020 MV: EF 55-65%, no isch/infart-->low risk.  ? Depression   ? Diabetes mellitus without complication ([place])   ? Essential  hypertension 06/30/2016  ? GERD (gastroesophageal reflux disease)   ? Heart attack ([place])   ? Total of 3 per pt.  ? Hyperlipidemia LDL goal <55   ? Hypertensive urgency 06/03/2015  ? PAD (peripheral artery disease) ([place])   ? a. 06/2019 Angio: R AT (PTA), R POP (PTA & Viabahn stenting); b. 09/2020 ABIs: R 1.24, L 1.35.  ? Palpitations   ? a. 06/2020 Zio: Sinus brady, 54 (40-143), 4 beats NSVT, 3 SVT runs (max 143 bpm x 4 beats).  ? Seizure ([place])   ?  ?Assessment and Plan: ?1. Bilateral sciatica   ?2. Lumbar spondylosis with myelopathy   ?3. DDD (degenerative disc disease), lumbar   ?4. Spinal stenosis of lumbar region with neurogenic claudication   ?5. Chronic, continuous use of opioids   ?  6. Chronic pain syndrome   ?7. Facet arthritis of lumbosacral region   ?8. Morbid obesity ([place])   ?9. Cervicogenic headache   ?Based on our discussion today I think it is appropriate to continue her current medications.  She just had a refill recently is taking her medications as requested with no diverting or illicit use and denies side effects with the medication and continues to derive good functional lifestyle improvement with them.  Refills will be done from May 6 and June 5.  I have reviewed the [facility] practitioner database information and it is appropriate.  I want her to continue follow-up with her primary care physician for baseline medical care and keep her appointment with her neurologist for Tuesday.  She does not feel that the fall was associated with any of her medications and she is recovering well from this.  At this point we will schedule her for 59-monthreturn to clinic.  Should she have any changes in her symptoms in the meantime she has been instructed to contact [place] the pain control clinic. ? ?Follow Up Instructions: ? ?  ?I discussed the assessment and treatment plan with the patient. The patient was provided an opportunity to ask questions and all were answered. The patient agreed with the plan and  demonstrated an understanding of the instructions. ?  ?The patient was advised to call back or seek an in-person evaluation if the symptoms worsen or if the condition fails to improve as anticipated. ? ?I provided 30 minut

## 2021-08-09 NOTE — ED Provider Notes (Signed)
? ?[facility] ?Provider Note ? ? ? Event Date/Time  ? First MD Initiated Contact with Patient 08/09/21 1507   ?  (approximate) ? ? ?History  ? ?Fall ? ? ?HPI ? ?Tina Hayes is a 76 y.o. female with a past medical history of CHF, COPD, CAD, depression, DM, HTN, GERD, HDL, PAD, seizure disorder, anxiety, arthritis and chronic back pain who presents for evaluation after a fall.  Patient states she was walking up some steps in her house using a cane which was post to be using a walker and she fell backwards hitting her head when she got to the for step.  She also states she had the left hip and primarily at this time has pain in her neck and hip.  She thinks she blacked out when she had the ground.  She is on Plavix but no other anticoagulation.  She states otherwise she is in her usual state of health without any recent fevers, chest pain, cough, vomiting, diarrhea, rash or other recent symptoms.  She states her walker broke when she dropped it down some steps. ? ?  ?Past Medical History:  ?Diagnosis Date  ? (HFpEF) heart failure with preserved ejection fraction ([place])   ? a. 05/2016 Echo: EF 60-65%, mild to mod LVH, Gr1 DD, mild MR, mildly dil LA, mod TR, mildly to mod increased PASP; b. 07/2020 Echo: EF 55-60%, no rwma, mild LVH, GrIDD, mildly dil LA, mild MR, mild-mod TR, mild-mod AoV sclerosis w/o stenosis.  ? Anxiety   ? Arthritis   ? Chronic back pain   ? COPD (chronic obstructive pulmonary disease) ([place])   ? Coronary artery disease   ? a. s/p remote PCI x 5;  b. 2006 s/p CABG x 3 ([place], [place]); b. 05/2016 MV: attenuation corrected images w/o ischemia or wma-->Med rx; c. 06/2020 MV: EF 55-65%, no isch/infart-->low risk.  ? Depression   ? Diabetes mellitus without complication ([place])   ? Essential hypertension 06/30/2016  ? GERD (gastroesophageal reflux disease)   ? Heart attack ([place])   ? Total of 3 per pt.  ? Hyperlipidemia LDL goal <55   ? Hypertensive urgency  06/03/2015  ? PAD (peripheral artery disease) ([place])   ? a. 06/2019 Angio: R AT (PTA), R POP (PTA & Viabahn stenting); b. 09/2020 ABIs: R 1.24, L 1.35.  ? Palpitations   ? a. 06/2020 Zio: Sinus brady, 54 (40-143), 4 beats NSVT, 3 SVT runs (max 143 bpm x 4 beats).  ? Seizure ([place])   ? ? ? ?Physical Exam  ?Triage Vital Signs: ?ED Triage Vitals  ?Enc Vitals Group  ?   BP 08/09/21 1454 (!) 226/76  ?   Pulse Rate 08/09/21 1454 (!) 54  ?   Resp 08/09/21 1454 18  ?   Temp 08/09/21 1454 98.5 ?F (36.9 ?C)  ?   Temp Source 08/09/21 1454 Oral  ?   SpO2 08/09/21 1454 99 %  ?   Weight 08/09/21 1451 261 lb (118.4 kg)  ?   Height 08/09/21 1451 '5\' 6"'$  (1.676 m)  ?   Head Circumference --   ?   Peak Flow --   ?   Pain Score 08/09/21 1451 8  ?   Pain Loc --   ?   Pain Edu? --   ?   Excl. in [place]? --   ? ? ?Most recent vital signs: ?Vitals:  ? 08/09/21 1530 08/09/21 1640  ?BP: (!) 181/69 [name])  198/69  ?Pulse: (!) 52 (!) 52  ?Resp: 18 18  ?Temp:    ?SpO2: (!) 87% 100%  ? ? ?General: Awake, no distress.  ?CV:  Good peripheral perfusion.  2+ radial pulses.  Slightly bradycardic. ?Resp:  Normal effort.  Clear bilaterally. ?Abd:  No distention.  Soft.  There is some very mild left inferior flank tenderness. ?Other:  Mild C-spine tenderness as well as mild trapezius tenderness.  No other obvious trauma to the face scalp head or neck.  Cranial nerves II through XII are grossly intact.  Patient has full strength and no significant pain on ranging of the bilateral shoulders, elbows and wrist.  She has some pain on passive ranging of the left hip which is little weak..  Otherwise he is intact strength about the bilateral lower extremities.  Some mild tenderness over the left inferior ribs and lower T-spine and upper L-spine but otherwise no areas of back tenderness. ? ? ?ED Results / Procedures / Treatments  ?Labs ?(all labs ordered are listed, but only abnormal results are displayed) ?Labs Reviewed  ?CBC WITH DIFFERENTIAL/PLATELET - Abnormal; Notable  for the following components:  ?    Result Value  ? RBC 3.44 (*)   ? Hemoglobin 8.8 (*)   ? HCT 29.7 (*)   ? MCH 25.6 (*)   ? MCHC 29.6 (*)   ? All other components within normal limits  ?COMPREHENSIVE METABOLIC PANEL - Abnormal; Notable for the following components:  ? Glucose, Bld 232 (*)   ? Calcium 8.7 (*)   ? All other components within normal limits  ?PROTIME-INR  ?TROPONIN I (HIGH SENSITIVITY)  ? ? ? ?EKG ? ?ECG is remarkable sinus rhythm with incomplete left bundle branch block without evidence of acute ischemia from likely nonspecific change in aVL with resolution of previously noted T wave inversion in aVF as well as lead III when compared to ECG from 02/24/2021. ? ? ?RADIOLOGY ? ?CT of the head and C-spine my interpretation showed no evidence of a skull fracture, intracranial hemorrhage, acute intracranial ischemia or mass effect or edema or acute C-spine injury.  I also reviewed radiologist interpretation and agree with the findings of no acute process and some degenerative changes in the C-spine. ? ?CXR on my interpretation shows no pneumothorax, focal consolidation, pneumothorax or rib fracture.  I also viewed radiology interpretation and agree with the findings of stable hiatal hernia without any other acute process as well as notation the patient is status post CABG. ? ?Plan, the T-spine, L-spine and left hip on my interpretation showed no acute fracture dislocation.  As reviewed radiology interpretation. ? ? ?PROCEDURES: ? ?Critical Care performed: No ? ?.1-3 Lead EKG Interpretation ?Performed by: Lucrezia Starch, MD ?Authorized by: Lucrezia Starch, MD  ? ?  Interpretation: non-specific   ?  ECG rate assessment: bradycardic   ?  Rhythm: sinus rhythm   ?  Ectopy: none   ?  Conduction: normal   ? ?The patient is on the cardiac monitor to evaluate for evidence of arrhythmia and/or significant heart rate changes. ? ? ?MEDICATIONS ORDERED IN ED: ?Medications  ?fentaNYL (SUBLIMAZE) injection 50 mcg (50  mcg Intravenous Given 08/09/21 1521)  ? ? ? ?IMPRESSION / MDM / ASSESSMENT AND PLAN / ED COURSE  ?I reviewed the triage vital signs and the nursing notes. ?             ?               ? ?  Differential diagnosis includes, but is not limited to possible skull fracture, intracranial hemorrhage, C-spine fracture, muscle strain, left hip fracture, and upper lumbar or lower thoracic fracture versus muscle strain and contusion. ? ?ECG is remarkable sinus rhythm with incomplete left bundle branch block without evidence of acute ischemia from likely nonspecific change in aVL with resolution of previously noted T wave inversion in aVF as well as lead III when compared to ECG from 02/24/2021.  Nonelevated troponin is not suggestive of cardiac ischemia patient denies any symptoms to suggest this is symptomatic at this time.  She describes tipping backwards because she did not have her walker which she had broken and overall Evalose patient for syncope as a cause of her fall.  No focal deficits to suggest stroke. ? ?CT of the head and C-spine my interpretation showed no evidence of a skull fracture, intracranial hemorrhage, acute intracranial ischemia or mass effect or edema or acute C-spine injury.  I also reviewed radiologist interpretation and agree with the findings of no acute process and some degenerative changes in the C-spine. ? ?CXR on my interpretation shows no pneumothorax, focal consolidation, pneumothorax or rib fracture.  I also viewed radiology interpretation and agree with the findings of stable hiatal hernia without any other acute process as well as notation the patient is status post CABG. ? ?Plan, the T-spine, L-spine and left hip on my interpretation showed no acute fracture dislocation.  As reviewed radiology interpretation. ? ?CBC shows no leukocytosis and hemoglobin 8.8.  Patient seems to range from 8.9 and 10.  Platelets are normal.  INR is 1.  CMP shows a glucose of 232 without any other significant  electrolyte or metabolic derangements ? ?On reassessment patient states she is feeling much better.  She was provided a walker for placement for her broken went home she was able to ambulate emergency room w

## 2021-08-09 NOTE — ED Notes (Signed)
Pt ambulated 70f with walker with no concerns for pain or falling ?

## 2021-08-09 NOTE — ED Notes (Signed)
Pt assisted to toilet with walker 

## 2021-08-09 NOTE — ED Triage Notes (Signed)
Pt brought in from home by EMS, states fell backwards down 4 steps. Pt co left rig pain and right neck pain, states did have some loc.  ?

## 2021-08-09 NOTE — Discharge Instructions (Addendum)
As we discussed please wear CPAP as your doctor recommends and have your blood pressure rechecked.  Please return to emergency room for any new or worsening symptoms.  I recommend Tylenol every 6 hours as needed for any ongoing discomfort ?

## 2021-08-12 ENCOUNTER — Telehealth: Payer: Self-pay | Admitting: Anesthesiology

## 2021-08-12 NOTE — Telephone Encounter (Signed)
Attempted to call patient to find out what she needed when she called.  LM to call office.  ?

## 2021-08-15 ENCOUNTER — Telehealth: Payer: Self-pay | Admitting: Anesthesiology

## 2021-08-15 NOTE — Telephone Encounter (Signed)
Patient called stating she has fractured ribs and fractured hip.  ?Pcp has put her on muscle relaxer and told her to rest.  ?

## 2021-08-20 ENCOUNTER — Ambulatory Visit: Payer: Medicare HMO

## 2021-08-23 ENCOUNTER — Other Ambulatory Visit: Payer: Self-pay | Admitting: Physician Assistant

## 2021-08-23 DIAGNOSIS — Z780 Asymptomatic menopausal state: Secondary | ICD-10-CM

## 2021-08-27 ENCOUNTER — Ambulatory Visit: Payer: Medicare HMO | Admitting: Medical

## 2021-08-27 NOTE — Progress Notes (Deleted)
Cardiology Office Note:    Date:  08/27/2021   ID:  Tina Hayes, DOB 1945-08-30, MRN [id number]  PCP:  Crissie Figures, PA-C  [facility] Cardiologist:  Ida Rogue, MD  [facility] Electrophysiologist:  None   Referring MD: Crissie Figures, PA-C   Chief Complaint: 6 month follow-up  History of Present Illness:    Tina Hayes is a 76 y.o. 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    The patient was seen in 2018 for intermittent chest and shoulder pain and tenderness for many months, which turned out to be GI issues. Since then she has been followed by the office. Echo 01/2019 showed EF 60%, mildly dilated right heart, moderate MR.    Seen in the ER 07/02/20 for chest pain, low suspicion for ACS. She had run out of her PPI. Cardiac work-up was negative, suspected acid-relfux and Nexium was refilled.    Patient seen 07/13/20 and reported lightheadedness, dizziness, and chest pressure. EKG showed bradycardia and coreg was discontinued. Echo and heart monitor ordered. Echo showed normal LV function, mild LVH, G1DD, mild to mod AS. Heart monitor showed SB, brief SVT and NSVT. Orthostatics negative. There was question of metolazone and torsemide dosing. Previous labs showed she was dry. Given normal EF and labs, metolazone was discontinued.   Last seen 12/25/20 and reported esophageal issues. She was s/p knee replacement surgery. She was referred to GI.   Today,     Past Medical History:  Diagnosis Date   (HFpEF) heart failure with preserved ejection fraction ([place])    a. 05/2016 Echo: EF 60-65%, mild to mod LVH, Gr1 DD, mild MR, mildly dil LA, mod TR, mildly to mod increased PASP; b. 07/2020 Echo: EF 55-60%, no rwma, mild LVH, GrIDD, mildly dil LA, mild MR, mild-mod TR, mild-mod AoV sclerosis w/o stenosis.   Anxiety     Arthritis    Chronic back pain    COPD (chronic obstructive pulmonary disease) (HCC)    Coronary artery disease    a. s/p remote PCI x 5;  b. 2006 s/p CABG x 3 ([place], [place]); b. 05/2016 MV: attenuation corrected images w/o ischemia or wma-->Med rx; c. 06/2020 MV: EF 55-65%, no isch/infart-->low risk.   Depression    Diabetes mellitus without complication ([place])    Essential hypertension 06/30/2016   GERD (gastroesophageal reflux disease)    Heart attack ([place])    Total of 3 per pt.   Hyperlipidemia LDL goal <55    Hypertensive urgency 06/03/2015   PAD (peripheral artery disease) ([place])    a. 06/2019 Angio: R AT (PTA), R POP (PTA & Viabahn stenting); b. 09/2020 ABIs: R 1.24, L 1.35.   Palpitations    a. 06/2020 Zio: Sinus brady, 54 (40-143), 4 beats NSVT, 3 SVT runs (max 143 bpm x 4 beats).   Seizure [facility])     Past Surgical History:  Procedure Laterality Date   ABDOMINAL HYSTERECTOMY     ANKLE SURGERY Right    CATARACT EXTRACTION W/ INTRAOCULAR LENS  IMPLANT, BILATERAL     COLONOSCOPY WITH PROPOFOL N/A 05/04/2017   Procedure: COLONOSCOPY WITH PROPOFOL;  Surgeon: [name], MD;  Location: [facility] ENDOSCOPY;  Service: Endoscopy;  Laterality: N/A;   CORONARY ANGIOPLASTY     CORONARY ARTERY BYPASS GRAFT  TRIPLE BYPASS   ESOPHAGOGASTRODUODENOSCOPY N/A 02/25/2021   Procedure: ESOPHAGOGASTRODUODENOSCOPY (EGD);  Surgeon: Lin Landsman, MD;  Location: [facility] ENDOSCOPY;  Service: Gastroenterology;  Laterality: N/A;   ESOPHAGOGASTRODUODENOSCOPY (EGD) WITH PROPOFOL N/A 05/04/2017   Procedure: ESOPHAGOGASTRODUODENOSCOPY (EGD) WITH PROPOFOL;  Surgeon: [name], MD;  Location: [facility] ENDOSCOPY;  Service: Endoscopy;  Laterality: N/A;   EXCISION MASS LOWER EXTREMETIES Right 03/02/2018   Procedure: EXCISION SOFT TISSUE MASS FROM MEDIAL ASPECT OF RIGHT ANKLE;  Surgeon: [name], MD;  Location: [facility] ORS;  Service: Orthopedics;  Laterality: Right;   FRACTURE  SURGERY     JOINT REPLACEMENT     right   KNEE SURGERY Right    LOWER EXTREMITY ANGIOGRAPHY Right 07/14/2019   Procedure: LOWER EXTREMITY ANGIOGRAPHY;  Surgeon: Algernon Huxley, MD;  Location: [place] CV LAB;  Service: Cardiovascular;  Laterality: Right;   MOUTH SURGERY Left 07/14/2017   TOTAL KNEE ARTHROPLASTY Left 09/27/2019   Procedure: TOTAL KNEE ARTHROPLASTY;  Surgeon: [name], MD;  Location: [facility] ORS;  Service: Orthopedics;  Laterality: Left;    Current Medications: No outpatient medications have been marked as taking for the 08/27/21 encounter (Appointment) with Kathlen Mody, Prentice Sackrider H, PA-C.     Allergies:   Aspirin   Social History   Socioeconomic History   Marital status: Widowed    Spouse name: Not on file   Number of children: Not on file   Years of education: Not on file   Highest education level: Not on file  Occupational History   Not on file  Tobacco Use   Smoking status: Former    Packs/day: 0.25    Years: 20.00    Pack years: 5.00    Types: Cigarettes    Quit date: 07/01/2006    Years since quitting: 15.1   Smokeless tobacco: Never  Vaping Use   Vaping Use: Never used  Substance and Sexual Activity   Alcohol use: No    Alcohol/week: 0.0 standard drinks   Drug use: No   Sexual activity: Yes  Other Topics Concern   Not on file  Social History Narrative   Lives locally.  Does not routinely exercise.   Social Determinants of Health   Financial Resource Strain: Not on file  Food Insecurity: Not on file  Transportation Needs: Not on file  Physical Activity: Not on file  Stress: Not on file  Social Connections: Not on file     Family History: The patient's family history includes Cancer in her sister; Depression in her mother; Diabetes in her brother, mother, and another family member; Heart attack in her mother and sister; Heart disease in her mother; Heart failure in her mother and sister; Hypertension in her brother, mother, sister, and another  family member; SIDS in her sister; Stroke in her mother.  ROS:   Please see the history of present illness.     All other systems reviewed and are negative.  EKGs/Labs/Other Studies Reviewed:    The following studies were reviewed today:    Echo 08/02/20  1. Left ventricular ejection fraction, by estimation, is 55 to 60%. Left  ventricular ejection fraction by 3D volume is 58 %. The left ventricle has  normal function. The left ventricle has no regional wall motion  abnormalities. There is mild left  ventricular hypertrophy. Left ventricular diastolic parameters are  consistent with Grade I diastolic dysfunction (impaired relaxation).   2. Right ventricular systolic function is normal. The right ventricular  size is normal. There  is normal pulmonary artery systolic pressure.   3. Left atrial size was mildly dilated.   4. The mitral valve is grossly normal. Mild mitral valve regurgitation.   5. Tricuspid valve regurgitation is mild to moderate.   6. The aortic valve is tricuspid. Aortic valve regurgitation is not  visualized. Mild to moderate aortic valve sclerosis/calcification is  present, without any evidence of aortic stenosis.   7. The inferior vena cava is normal in size with greater than 50%  respiratory variability, suggesting right atrial pressure of 3 mmHg.    Heart monitor   Patch Wear Time:  11 days and 3 hours (2022-03-18T11:58:31-0400 to 2022-03-29T15:13:51-0400)   Patient had a min HR of 40 bpm, max HR of 143 bpm, and avg HR of 54 bpm. Predominant underlying rhythm was Sinus Rhythm. 1 run of Ventricular Tachycardia occurred lasting 4 beats with a max rate of 141 bpm (avg 127 bpm). 3 Supraventricular Tachycardia  runs occurred, the run with the fastest interval lasting 4 beats with a max rate of 143 bpm, the longest lasting 4 beats with an avg rate of 103 bpm. Isolated SVEs were rare (<1.0%), SVE Couplets were rare (<1.0%), and SVE Triplets were rare (<1.0%).  Isolated  VEs were rare (<1.0%, 1969), VE Couplets were rare (<1.0%, 331), and VE Triplets were rare (<1.0%, 7). Ventricular Bigeminy and Trigeminy were present.      Echo 2020   1. Left ventricular ejection fraction, by visual estimation, is 60 to  65%. The left ventricle has normal function. Normal left ventricular size.  There is mildly increased left ventricular hypertrophy.   2. Left ventricular diastolic Doppler parameters are consistent with  impaired relaxation pattern of LV diastolic filling.   3. Global right ventricle has normal systolic function.The right  ventricular size is mildly enlarged. No increase in right ventricular wall  thickness.   4. Left atrial size was mildly dilated.   5. Moderate mitral valve regurgitation.   6. The tricuspid valve is normal in structure. Tricuspid valve  regurgitation moderate.   7. Moderately elevated pulmonary artery systolic pressure.   8. The tricuspid regurgitant velocity is 2.83 m/s, and with an assumed  right atrial pressure of 15 mmHg, the estimated right ventricular systolic  pressure is moderately elevated at 47.0   EKG:  EKG is *** ordered today.  The ekg ordered today demonstrates ***  Recent Labs: 02/25/2021: Magnesium 1.8 08/09/2021: ALT 15; BUN 13; Creatinine, Ser 0.78; Hemoglobin 8.8; Platelets 178; Potassium 4.4; Sodium 142  Recent Lipid Panel    Component Value Date/Time   CHOL 120 01/30/2017 0404   TRIG 73 01/30/2017 0404   HDL 47 01/30/2017 0404   CHOLHDL 2.6 01/30/2017 0404   VLDL 15 01/30/2017 0404   LDLCALC 58 01/30/2017 0404     Risk Assessment/Calculations:   {Does this patient have ATRIAL FIBRILLATION?:[phone]}   Physical Exam:    VS:  There were no vitals taken for this visit.    Wt Readings from Last 3 Encounters:  08/09/21 261 lb (118.4 kg)  06/17/21 260 lb (117.9 kg)  05/28/21 260 lb (117.9 kg)     GEN: *** Well nourished, well developed in no acute distress HEENT: Normal NECK: No JVD; No  carotid bruits LYMPHATICS: No lymphadenopathy CARDIAC: ***RRR, no murmurs, rubs, gallops RESPIRATORY:  Clear to auscultation without rales, wheezing or rhonchi  ABDOMEN: Soft, non-tender, non-distended MUSCULOSKELETAL:  No edema; No deformity  SKIN: Warm and dry NEUROLOGIC:  Alert and oriented x 3 PSYCHIATRIC:  Normal affect   ASSESSMENT:    No diagnosis found. PLAN:    In order of problems listed above:  HFpEF Valvular disease  CAD s/p CABG and remote stenting  HTN  Morbid obesity  Disposition: Follow up {follow up:15908} with ***   Shared Decision Making/Informed Consent   {Are you ordering a CV Procedure (e.g. stress test, cath, DCCV, TEE, etc)?   Press F2        :[id number]}    Signed, Destinae Neubecker Ninfa Meeker, PA-C  08/27/2021 11:20 AM    [facility]

## 2021-08-28 ENCOUNTER — Encounter: Payer: Self-pay | Admitting: Medical

## 2021-09-03 ENCOUNTER — Other Ambulatory Visit: Payer: Self-pay | Admitting: Anesthesiology

## 2021-10-01 ENCOUNTER — Other Ambulatory Visit: Payer: Medicare HMO

## 2021-10-08 ENCOUNTER — Other Ambulatory Visit (INDEPENDENT_AMBULATORY_CARE_PROVIDER_SITE_OTHER): Payer: Self-pay | Admitting: Nurse Practitioner

## 2021-10-08 DIAGNOSIS — Z9889 Other specified postprocedural states: Secondary | ICD-10-CM

## 2021-10-09 ENCOUNTER — Ambulatory Visit (INDEPENDENT_AMBULATORY_CARE_PROVIDER_SITE_OTHER): Payer: 59 | Admitting: Nurse Practitioner

## 2021-10-09 ENCOUNTER — Encounter (INDEPENDENT_AMBULATORY_CARE_PROVIDER_SITE_OTHER): Payer: 59

## 2021-10-21 NOTE — Progress Notes (Signed)
Office Visit    Patient Name: Tina Hayes Date of Encounter: 10/21/2021  Primary Care Provider:  Gorden Harms, PA-C Primary Cardiologist:  Julien Nordmann, MD Primary Electrophysiologist: None  Chief Complaint    Tina Hayes is a 76 y.o. female with PMH of CAD with MI and PCI x5, s/p CABG x 3 on 06/2004 in [place], COPD, HTN, DM type II, chronic diastolic CHF, tobacco abuse, seizure disorder, chronic back pain who presents today for follow-up of CHF and CAD.  Past Medical History    Past Medical History:  Diagnosis Date   (HFpEF) heart failure with preserved ejection fraction (HCC)    a. 05/2016 Echo: EF 60-65%, mild to mod LVH, Gr1 DD, mild MR, mildly dil LA, mod TR, mildly to mod increased PASP; b. 07/2020 Echo: EF 55-60%, no rwma, mild LVH, GrIDD, mildly dil LA, mild MR, mild-mod TR, mild-mod AoV sclerosis w/o stenosis.   Anxiety    Arthritis    Chronic back pain    COPD (chronic obstructive pulmonary disease) (HCC)    Coronary artery disease    a. s/p remote PCI x 5;  b. 2006 s/p CABG x 3 ([place], [place] [facility]); b. 05/2016 MV: attenuation corrected images w/o ischemia or wma-->Med rx; c. 06/2020 MV: EF 55-65%, no isch/infart-->low risk.   Depression    Diabetes mellitus without complication (HCC)    Essential hypertension 06/30/2016   GERD (gastroesophageal reflux disease)    Heart attack (HCC)    Total of 3 per pt.   Hyperlipidemia LDL goal <55    Hypertensive urgency 06/03/2015   PAD (peripheral artery disease) (HCC)    a. 06/2019 Angio: R AT (PTA), R POP (PTA & Viabahn stenting); b. 09/2020 ABIs: R 1.24, L 1.35.   Palpitations    a. 06/2020 Zio: Sinus brady, 54 (40-143), 4 beats NSVT, 3 SVT runs (max 143 bpm x 4 beats).   Seizure [name])    Past Surgical History:  Procedure Laterality Date   ABDOMINAL HYSTERECTOMY     ANKLE SURGERY Right    CATARACT EXTRACTION W/ INTRAOCULAR LENS  IMPLANT, BILATERAL     COLONOSCOPY WITH  PROPOFOL N/A 05/04/2017   Procedure: COLONOSCOPY WITH PROPOFOL;  Surgeon: Scot Jun, MD;  Location: [facility] ENDOSCOPY;  Service: Endoscopy;  Laterality: N/A;   CORONARY ANGIOPLASTY     CORONARY ARTERY BYPASS GRAFT     TRIPLE BYPASS   ESOPHAGOGASTRODUODENOSCOPY N/A 02/25/2021   Procedure: ESOPHAGOGASTRODUODENOSCOPY (EGD);  Surgeon: Toney Reil, MD;  Location: [facility] ENDOSCOPY;  Service: Gastroenterology;  Laterality: N/A;   ESOPHAGOGASTRODUODENOSCOPY (EGD) WITH PROPOFOL N/A 05/04/2017   Procedure: ESOPHAGOGASTRODUODENOSCOPY (EGD) WITH PROPOFOL;  Surgeon: Scot Jun, MD;  Location: [facility] ENDOSCOPY;  Service: Endoscopy;  Laterality: N/A;   EXCISION MASS LOWER EXTREMETIES Right 03/02/2018   Procedure: EXCISION SOFT TISSUE MASS FROM MEDIAL ASPECT OF RIGHT ANKLE;  Surgeon: Christena Flake, MD;  Location: [facility] ORS;  Service: Orthopedics;  Laterality: Right;   FRACTURE SURGERY     JOINT REPLACEMENT     right   KNEE SURGERY Right    LOWER EXTREMITY ANGIOGRAPHY Right 07/14/2019   Procedure: LOWER EXTREMITY ANGIOGRAPHY;  Surgeon: Annice Needy, MD;  Location: [facility] INVASIVE CV LAB;  Service: Cardiovascular;  Laterality: Right;   MOUTH SURGERY Left 07/14/2017   TOTAL KNEE ARTHROPLASTY Left 09/27/2019   Procedure: TOTAL KNEE ARTHROPLASTY;  Surgeon: Christena Flake, MD;  Location: [facility] ORS;  Service: Orthopedics;  Laterality: Left;  Allergies  Allergies  Allergen Reactions   Aspirin Anaphylaxis, Shortness Of Breath and Swelling    LIPS AND THROAT SWELL, DIFFICULT TO BREATH    History of Present Illness    Tina Hayes is a 76 year old female with the above-mentioned past medical history who presents today for follow-up of CAD and CHF.  Tina Hayes was admitted to the hospital in 05/2016 for complaint of intermittent chest pain and shoulder pain.  She underwent nuclear stress test that was negative and no further ischemic evaluation was done at that time.  2D echo showed normal LV function and  patient was initiated on PPI therapy.  She was seen in the ED again in 01/2017 with complaint of musculoskeletal chest pain with epigastric fullness.  There was also nausea and vomiting that resolved upon arrival to ED.  Ischemic evaluation was negative and pain felt to be atypical in nature.  She has had some noncompliance with torsemide in the past with increased weight gain.  She is also noted to have hiatal hernia that caused the food to get stuck and inability to swallow.  She is being followed by GI for possible esophageal dilatation.  She underwent sleep study with recommendations for CPAP.  She underwent PV study in 06/2019 percutaneous angioplasty of right anterior tibial artery and stent placed to right popliteal artery.  She was last seen by Dr. Mariah Milling on 11/2020 for follow-up.  Most recent ischemic evaluation was completed 06/2020 with normal Lexiscan and EF of 55-60%.  2D echo completed 07/2020 with mild to moderate TR noted.  She was noted to be euvolemic on examination during visit.  On 08/09/2021 patient was admitted to the ED for evaluation of a fall at her home where she struck her head while falling backwards off of her porch.  His CT showed no evidence of skull fracture or intracranial hemorrhage.  Chest x-ray was also negative for pneumothorax but did reveal hiatal hernia.  Since last being seen in the office patient reports that since her fall in April she has noticed elevated blood pressures at home.  Blood pressure today was 160/90 on the right arm and upon recheck was 164/102.  She states that prior to her fall her blood pressures have been ranging in the 130s 140s systolically.  She is euvolemic on examination with trace edema noted in bilateral extremities.  She notes that her hiatal hernia and reflux has been worse and has plans to follow-up with GI soon.  She does endorse occasional headaches since her fall 2 months ago. She is limited with physical physical activities due to bilateral  knee replacements.  Patient denies chest pain, palpitations, dyspnea, PND, orthopnea, nausea, vomiting, dizziness, syncope, edema, weight gain, or early satiety.  Home Medications    Current Outpatient Medications  Medication Sig Dispense Refill   acetaminophen (TYLENOL) 500 MG tablet Take 500 mg by mouth every 6 (six) hours as needed for headache, moderate pain or fever.     albuterol (PROVENTIL HFA;VENTOLIN HFA) 108 (90 Base) MCG/ACT inhaler Inhale 2 puffs into the lungs every 6 (six) hours as needed for wheezing or shortness of breath. 1 Inhaler 2   albuterol (PROVENTIL) (2.5 MG/3ML) 0.083% nebulizer solution Take 2.5 mg every 4 (four) hours as needed by nebulization for wheezing or shortness of breath.     amLODipine (NORVASC) 5 MG tablet Take 1 tablet (5 mg total) by mouth daily. 30 tablet 6   atorvastatin (LIPITOR) 40 MG tablet Take 1 tablet (40 mg  total) by mouth daily. 30 tablet 0   B-D UF III MINI PEN NEEDLES 31G X 5 MM MISC USE WITH INSULIN PEN INJECTIONS TWICE DAILY     carvedilol (COREG) 3.125 MG tablet Take 1 tablet (3.125 mg total) by mouth daily as needed. 90 tablet 3   clopidogrel (PLAVIX) 75 MG tablet Take 1 tablet (75 mg total) by mouth daily. 90 tablet 3   HYDROcodone-acetaminophen (NORCO) 7.5-325 MG tablet Take 1 tablet by mouth every 6 (six) hours as needed for moderate pain or severe pain. 120 tablet 0   Ipratropium-Albuterol (COMBIVENT) 20-100 MCG/ACT AERS respimat Inhale into the lungs.     isosorbide mononitrate (IMDUR) 30 MG 24 hr tablet Take 1 tablet (30 mg total) by mouth daily. 180 tablet 3   LANTUS SOLOSTAR 100 UNIT/ML Solostar Pen Inject 30-40 Units into the skin See admin instructions. Inject 30 units subcutaneously in the morning at 40 units subcutaneously at bedtime.     levETIRAcetam (KEPPRA) 750 MG tablet Take 750 mg by mouth 2 (two) times daily.     losartan (COZAAR) 100 MG tablet Take 1 tablet (100 mg total) by mouth daily. 90 tablet 3   nitroGLYCERIN  (NITROSTAT) 0.4 MG SL tablet DISSOLVE ONE TABLET UNDER THE TONGUE EVERY 5 MINUTES AS NEEDED FOR CHEST PAIN.  DO NOT EXCEED A TOTAL OF 3 DOSES IN 15 MINUTES 25 tablet 0   ondansetron (ZOFRAN) 4 MG tablet Take 4 mg by mouth as needed.     pantoprazole (PROTONIX) 40 MG tablet Take 1 tablet (40 mg total) by mouth 2 (two) times daily. 60 tablet 1   potassium chloride SA (KLOR-CON) 20 MEQ tablet Take 2 tablets (40 mEq total) by mouth 2 (two) times daily. Take extra 2 tablets (40 meq) if you take metolazone 140 tablet 5   sucralfate (CARAFATE) 1 g tablet Take 1 tablet (1 g total) by mouth 4 (four) times daily as needed (for abdominal discomfort, nausea, and/or vomiting). 30 tablet 1   tiotropium (SPIRIVA) 18 MCG inhalation capsule Place 18 mcg into inhaler and inhale daily.     torsemide (DEMADEX) 20 MG tablet Take 1 tablet (20 mg) by mouth twice daily     traZODone (DESYREL) 50 MG tablet Take 50 mg by mouth at bedtime.     No current facility-administered medications for this visit.     Review of Systems  Please see the history of present illness.    (+) Occasional headaches (+) Reflux GI fullness following meals  All other systems reviewed and are otherwise negative except as noted above.  Physical Exam    Wt Readings from Last 3 Encounters:  08/09/21 261 lb (118.4 kg)  06/17/21 260 lb (117.9 kg)  05/28/21 260 lb (117.9 kg)   ZO:XWRUE were no vitals filed for this visit.,There is no height or weight on file to calculate BMI.  Constitutional:      Appearance: Healthy appearance. Not in distress.  Neck:     Vascular: JVD normal.  Pulmonary:     Effort: Pulmonary effort is normal.     Breath sounds: No wheezing. No rales. Diminished in the bases Cardiovascular:     Normal rate. Regular rhythm. Normal S1. Normal S2.      Murmurs: There is no murmur.  Edema:    Peripheral edema absent.  Abdominal:     Palpations: Abdomen is soft non tender. There is no hepatomegaly.  Skin:     General: Skin is warm and dry.  Neurological:  General: No focal deficit present.     Mental Status: Alert and oriented to person, place and time.     Cranial Nerves: Cranial nerves are intact.  EKG/LABS/Other Studies Reviewed    ECG personally reviewed by me today -sinus rhythm with left ventricular hypertrophy and TWI noted in aVL that is consistent with previous EKG.  Lab Results  Component Value Date   WBC 4.0 08/09/2021   HGB 8.8 (L) 08/09/2021   HCT 29.7 (L) 08/09/2021   MCV 86.3 08/09/2021   PLT 178 08/09/2021   Lab Results  Component Value Date   CREATININE 0.78 08/09/2021   BUN 13 08/09/2021   NA 142 08/09/2021   K 4.4 08/09/2021   CL 108 08/09/2021   CO2 26 08/09/2021   Lab Results  Component Value Date   ALT 15 08/09/2021   AST 22 08/09/2021   ALKPHOS 95 08/09/2021   BILITOT 0.8 08/09/2021   Lab Results  Component Value Date   CHOL 120 01/30/2017   HDL 47 01/30/2017   LDLCALC 58 01/30/2017   TRIG 73 01/30/2017   CHOLHDL 2.6 01/30/2017    Lab Results  Component Value Date   HGBA1C 7.7 (H) 02/24/2021    Assessment & Plan    1.  HFpEF: - EF of 55-60% with mild to moderate TR on TEE completed 07/2020 -Patient is euvolemic on examination today with trace edema in bilateral extremities -Continue carvedilol 3.125 daily and losartan 100 mg daily -Continue torsemide 20 mg twice daily   2.  CAD: -s/p previous MI with 5 PCI and CABG x3 in 2006. -Patient denies any anginal pain similar to previous MI but does not endorse GI related reflux pain that occurs following meals. Continue Lipitor 40 mg daily and Plavix 75 mg daily -Continue carvedilol 3.125 and Imdur 30 mg daily  3.  HTN: -Blood pressure today was elevated at 160/90 and was still elevated on recheck at 164/102 -We will start amlodipine 5 mg daily and patient directed to log blood pressures and report any blood pressures of greater than 140's systolic  -If blood pressures remain elevated we will  increase amlodipine to 10 mg on next visit.  4.  GERD: -Patient still endorsing fullness and reflux following meals and has follow-up scheduled in the next few weeks to discuss with gastroenterology  5.  Hyperlipidemia: -Last LDL was 47 on 01/2017 -We will plan to repeat lipids at next follow-up with LFTs  6.  Obstructive sleep apnea: -Patient states that she is not having restful sleep and has not had access to her CPAP and a few years. -Advised her to follow-up with her PCP regarding obtaining replacement of CPAP  Disposition: Follow-up with Julien Nordmann, MD or APP in    2 weeks Medication Adjustments/Labs and Tests Ordered: Current medicines are reviewed at length with the patient today.  Concerns regarding medicines are outlined above.   Signed, Napoleon Form, Leodis Rains, NP 10/21/2021, 12:51 PM [facility]

## 2021-10-23 ENCOUNTER — Other Ambulatory Visit: Payer: Self-pay | Admitting: Student

## 2021-10-23 DIAGNOSIS — Z780 Asymptomatic menopausal state: Secondary | ICD-10-CM

## 2021-10-23 DIAGNOSIS — Z1231 Encounter for screening mammogram for malignant neoplasm of breast: Secondary | ICD-10-CM

## 2021-10-24 ENCOUNTER — Ambulatory Visit (INDEPENDENT_AMBULATORY_CARE_PROVIDER_SITE_OTHER): Payer: Medicare HMO | Admitting: Nurse Practitioner

## 2021-10-24 ENCOUNTER — Encounter: Payer: Self-pay | Admitting: Nurse Practitioner

## 2021-10-24 VITALS — BP 160/90 | HR 66 | Ht 71.0 in | Wt 267.0 lb

## 2021-10-24 DIAGNOSIS — G4733 Obstructive sleep apnea (adult) (pediatric): Secondary | ICD-10-CM

## 2021-10-24 DIAGNOSIS — I25119 Atherosclerotic heart disease of native coronary artery with unspecified angina pectoris: Secondary | ICD-10-CM | POA: Diagnosis not present

## 2021-10-24 DIAGNOSIS — Z951 Presence of aortocoronary bypass graft: Secondary | ICD-10-CM | POA: Diagnosis not present

## 2021-10-24 DIAGNOSIS — I5032 Chronic diastolic (congestive) heart failure: Secondary | ICD-10-CM

## 2021-10-24 MED ORDER — AMLODIPINE BESYLATE 5 MG PO TABS
5.0000 mg | ORAL_TABLET | Freq: Every day | ORAL | 3 refills | Status: DC
Start: 2021-10-24 — End: 2022-09-23

## 2021-10-24 NOTE — Patient Instructions (Addendum)
Medication Instructions:  Your physician has recommended you make the following change in your medication:   RESTART Amlodipine 5 mg once a day  *If you need a refill on your cardiac medications before your next appointment, please call your pharmacy*   Lab Work: None  If you have labs (blood work) drawn today and your tests are completely normal, you will receive your results only by: [place] (if you have MyChart) OR A paper copy in the mail If you have any lab test that is abnormal or we need to change your treatment, we will call you to review the results.   Testing/Procedures: None   Follow-Up: At [facility], you and your health needs are our priority.  As part of our continuing mission to provide you with exceptional heart care, we have created designated Provider Care Teams.  These Care Teams include your primary Cardiologist (physician) and Advanced Practice Providers (APPs -  Physician Assistants and Nurse Practitioners) who all work together to provide you with the care you need, when you need it.  We recommend signing up for the patient portal called "MyChart".  Sign up information is provided on this After Visit Summary.  MyChart is used to connect with patients for Virtual Visits (Telemedicine).  Patients are able to view lab/test results, encounter notes, upcoming appointments, etc.  Non-urgent messages can be sent to your provider as well.   To learn more about what you can do with MyChart, go to [email or web address].    Your next appointment:   2 week(s)  The format for your next appointment:   In Person  Provider:   Ida Rogue, MD or Murray Hodgkins, NP       Important Information About Sugar

## 2021-10-28 ENCOUNTER — Ambulatory Visit: Payer: Medicare HMO | Attending: Anesthesiology | Admitting: Anesthesiology

## 2021-10-29 MED ORDER — HYDROCODONE-ACETAMINOPHEN 7.5-325 MG PO TABS: 1.0000 | ORAL_TABLET | Freq: Four times a day (QID) | ORAL | 0 refills | Status: DC | PRN

## 2021-10-29 NOTE — Progress Notes (Unsigned)
I tried to speak with Tina Hayes via telephone today.  The number that she has provided appears to be out of service.

## 2021-10-30 ENCOUNTER — Telehealth: Payer: Self-pay

## 2021-10-30 NOTE — Telephone Encounter (Signed)
Reschedule her as soon as appt available

## 2021-10-30 NOTE — Telephone Encounter (Signed)
Her medicine runs out tomorrow and Dr. Andree Elk couldn't reach her Monday because someone stole her phone and she got a new one. I put the correct number in so what can be done.

## 2021-10-31 ENCOUNTER — Ambulatory Visit: Payer: Medicare HMO | Attending: Anesthesiology | Admitting: Anesthesiology

## 2021-11-01 MED ORDER — HYDROCODONE-ACETAMINOPHEN 7.5-325 MG PO TABS: 1.0000 | ORAL_TABLET | Freq: Four times a day (QID) | ORAL | 0 refills | Status: AC | PRN

## 2021-11-11 ENCOUNTER — Encounter: Payer: Self-pay | Admitting: Nurse Practitioner

## 2021-11-11 ENCOUNTER — Ambulatory Visit: Payer: Medicare HMO | Admitting: Nurse Practitioner

## 2021-11-11 NOTE — Progress Notes (Deleted)
Office Visit    Patient Name: Tina Hayes Date of Encounter: 11/11/2021  Primary Care Provider:  Crissie Figures, PA-C Primary Cardiologist:  Ida Rogue, MD  Chief Complaint    76 year old female with a history of CAD status post remote PCI's and three-vessel bypass in 2006, hypertension, hyperlipidemia, diabetes, chronic HFpEF, peripheral arterial disease, palpitations, prior tobacco use, COPD, obesity, and seizure disorder, who presents for follow-up related to ***  Past Medical History    Past Medical History:  Diagnosis Date   (HFpEF) heart failure with preserved ejection fraction ([place])    a. 05/2016 Echo: EF 60-65%, mild to mod LVH, Gr1 DD, mild MR, mildly dil LA, mod TR, mildly to mod increased PASP; b. 07/2020 Echo: EF 55-60%, no rwma, mild LVH, GrIDD, mildly dil LA, mild MR, mild-mod TR, mild-mod AoV sclerosis w/o stenosis.   Anxiety    Arthritis    Chronic back pain    COPD (chronic obstructive pulmonary disease) (HCC)    Coronary artery disease    a. s/p remote PCI x 5;  b. 2006 s/p CABG x 3 ([place], [place] b. 05/2016 MV: attenuation corrected images w/o ischemia or wma-->Med rx; c. 06/2020 MV: EF 55-65%, no isch/infart-->low risk.   Depression    Diabetes mellitus without complication ([place])    Essential hypertension 06/30/2016   GERD (gastroesophageal reflux disease)    Heart attack ([place])    Total of 3 per pt.   Hyperlipidemia LDL goal <55    Hypertensive urgency 06/03/2015   PAD (peripheral artery disease) ([place])    a. 06/2019 Angio: R AT (PTA), R POP (PTA & Viabahn stenting); b. 09/2020 ABIs: R 1.24, L 1.35.   Palpitations    a. 06/2020 Zio: Sinus brady, 54 (40-143), 4 beats NSVT, 3 SVT runs (max 143 bpm x 4 beats).   Seizure [facility])    Past Surgical History:  Procedure Laterality Date   ABDOMINAL HYSTERECTOMY     ANKLE SURGERY Right    CATARACT EXTRACTION W/ INTRAOCULAR LENS  IMPLANT, BILATERAL     COLONOSCOPY WITH PROPOFOL N/A  05/04/2017   Procedure: COLONOSCOPY WITH PROPOFOL;  Surgeon: [name], MD;  Location: [facility] ENDOSCOPY;  Service: Endoscopy;  Laterality: N/A;   CORONARY ANGIOPLASTY     CORONARY ARTERY BYPASS GRAFT     TRIPLE BYPASS   ESOPHAGOGASTRODUODENOSCOPY N/A 02/25/2021   Procedure: ESOPHAGOGASTRODUODENOSCOPY (EGD);  Surgeon: Lin Landsman, MD;  Location: [facility] ENDOSCOPY;  Service: Gastroenterology;  Laterality: N/A;   ESOPHAGOGASTRODUODENOSCOPY (EGD) WITH PROPOFOL N/A 05/04/2017   Procedure: ESOPHAGOGASTRODUODENOSCOPY (EGD) WITH PROPOFOL;  Surgeon: [name], MD;  Location: [facility] ENDOSCOPY;  Service: Endoscopy;  Laterality: N/A;   EXCISION MASS LOWER EXTREMETIES Right 03/02/2018   Procedure: EXCISION SOFT TISSUE MASS FROM MEDIAL ASPECT OF RIGHT ANKLE;  Surgeon: [name], MD;  Location: [facility] ORS;  Service: Orthopedics;  Laterality: Right;   FRACTURE SURGERY     JOINT REPLACEMENT     right   KNEE SURGERY Right    LOWER EXTREMITY ANGIOGRAPHY Right 07/14/2019   Procedure: LOWER EXTREMITY ANGIOGRAPHY;  Surgeon: Algernon Huxley, MD;  Location: [facility];  Service: Cardiovascular;  Laterality: Right;   MOUTH SURGERY Left 07/14/2017   TOTAL KNEE ARTHROPLASTY Left 09/27/2019   Procedure: TOTAL KNEE ARTHROPLASTY;  Surgeon: [name], MD;  Location: [facility] ORS;  Service: Orthopedics;  Laterality: Left;    Allergies  Allergies  Allergen Reactions   Aspirin Anaphylaxis, Shortness  Of Breath and Swelling    LIPS AND THROAT SWELL, DIFFICULT TO BREATH    History of Present Illness    76 year old female with a history of CAD status post remote PCI's and three-vessel bypass in 2006, hypertension, hyperlipidemia, chronic HFpEF, diabetes, peripheral arterial disease, palpitations, prior tobacco use, COPD, obesity, and seizure disorder.  As noted above, she previously underwent PCI x5 followed by CABG x3 in 2006 performed in [place], [place].  In 2018, she was admitted with chest  and shoulder pain and underwent stress testing which showed no ischemia or infarct, and she was medically managed.  In the setting of lower extremity claudication, she underwent peripheral angiography in March 2021 with subsequent PTA of the right anterior tibial and PTA/stenting of the right popliteal artery.  More recently, in March 2022, she was seen in the emergency department with chest pain with negative cardiac work-up.  She subsequently underwent repeat Lexiscan Myoview which showed normal LV function without ischemia or infarct and was felt to be a low risk study.  An echocardiogram was also performed and April 2022, which showed an EF of 55 to 60% with mild LVH, grade 1 diastolic dysfunction, mild MR, mild to moderate TR, and mild to moderate aortic valve sclerosis without stenosis.  She was seen in the ED in April 2023 following a fall at home, following which, she struck her head.  Head CT showed no acute findings.  Ms. Mccormick was last seen in cardiology clinic on June 29, at which time she was hypertensive.  She also reported worsening reflux and dysphagia related to hiatal hernia with plan for GI follow-up.  She was placed amlodipine 5 mg daily with plan for follow-up today.  Home Medications    Current Outpatient Medications  Medication Sig Dispense Refill   acetaminophen (TYLENOL) 500 MG tablet Take 500 mg by mouth every 6 (six) hours as needed for headache, moderate pain or fever.     albuterol (PROVENTIL HFA;VENTOLIN HFA) 108 (90 Base) MCG/ACT inhaler Inhale 2 puffs into the lungs every 6 (six) hours as needed for wheezing or shortness of breath. 1 Inhaler 2   albuterol (PROVENTIL) (2.5 MG/3ML) 0.083% nebulizer solution Take 2.5 mg every 4 (four) hours as needed by nebulization for wheezing or shortness of breath.     amLODipine (NORVASC) 5 MG tablet Take 1 tablet (5 mg total) by mouth daily. 90 tablet 3   atorvastatin (LIPITOR) 40 MG tablet Take 1 tablet (40 mg total) by mouth daily. 30  tablet 0   B-D UF III MINI PEN NEEDLES 31G X 5 MM MISC USE WITH INSULIN PEN INJECTIONS TWICE DAILY     carvedilol (COREG) 3.125 MG tablet Take 1 tablet (3.125 mg total) by mouth daily as needed. 90 tablet 3   clopidogrel (PLAVIX) 75 MG tablet Take 1 tablet (75 mg total) by mouth daily. 90 tablet 3   HYDROcodone-acetaminophen (NORCO) 7.5-325 MG tablet Take 1 tablet by mouth every 6 (six) hours as needed for moderate pain or severe pain. 120 tablet 0   Ipratropium-Albuterol (COMBIVENT) 20-100 MCG/ACT AERS respimat Inhale into the lungs.     isosorbide mononitrate (IMDUR) 30 MG 24 hr tablet Take 1 tablet (30 mg total) by mouth daily. 180 tablet 3   LANTUS SOLOSTAR 100 UNIT/ML Solostar Pen Inject 30-40 Units into the skin See admin instructions. Inject 30 units subcutaneously in the morning at 40 units subcutaneously at bedtime.     levETIRAcetam (KEPPRA) 750 MG tablet Take 750 mg by  mouth 2 (two) times daily.     losartan (COZAAR) 100 MG tablet Take 1 tablet (100 mg total) by mouth daily. 90 tablet 3   nitroGLYCERIN (NITROSTAT) 0.4 MG SL tablet DISSOLVE ONE TABLET UNDER THE TONGUE EVERY 5 MINUTES AS NEEDED FOR CHEST PAIN.  DO NOT EXCEED A TOTAL OF 3 DOSES IN 15 MINUTES 25 tablet 0   ondansetron (ZOFRAN) 4 MG tablet Take 4 mg by mouth as needed.     pantoprazole (PROTONIX) 40 MG tablet Take 1 tablet (40 mg total) by mouth 2 (two) times daily. 60 tablet 1   potassium chloride SA (KLOR-CON) 20 MEQ tablet Take 2 tablets (40 mEq total) by mouth 2 (two) times daily. Take extra 2 tablets (40 meq) if you take metolazone 140 tablet 5   sucralfate (CARAFATE) 1 g tablet Take 1 tablet (1 g total) by mouth 4 (four) times daily as needed (for abdominal discomfort, nausea, and/or vomiting). 30 tablet 1   tiotropium (SPIRIVA) 18 MCG inhalation capsule Place 18 mcg into inhaler and inhale daily.     torsemide (DEMADEX) 20 MG tablet Take 1 tablet (20 mg) by mouth twice daily     traZODone (DESYREL) 50 MG tablet Take 50  mg by mouth at bedtime.     No current facility-administered medications for this visit.     Review of Systems    ***.  All other systems reviewed and are otherwise negative except as noted above.    Physical Exam    VS:  There were no vitals taken for this visit. , BMI There is no height or weight on file to calculate BMI.     GEN: Well nourished, well developed, in no acute distress. HEENT: normal. Neck: Supple, no JVD, carotid bruits, or masses. Cardiac: RRR, no murmurs, rubs, or gallops. No clubbing, cyanosis, edema.  Radials/DP/PT 2+ and equal bilaterally.  Respiratory:  Respirations regular and unlabored, clear to auscultation bilaterally. GI: Soft, nontender, nondistended, BS + x 4. MS: no deformity or atrophy. Skin: warm and dry, no rash. Neuro:  Strength and sensation are intact. Psych: Normal affect.  Accessory Clinical Findings    ECG personally reviewed by me today - *** - no acute changes.  Lab Results  Component Value Date   WBC 4.0 08/09/2021   HGB 8.8 (L) 08/09/2021   HCT 29.7 (L) 08/09/2021   MCV 86.3 08/09/2021   PLT 178 08/09/2021   Lab Results  Component Value Date   CREATININE 0.78 08/09/2021   BUN 13 08/09/2021   NA 142 08/09/2021   K 4.4 08/09/2021   CL 108 08/09/2021   CO2 26 08/09/2021   Lab Results  Component Value Date   ALT 15 08/09/2021   AST 22 08/09/2021   ALKPHOS 95 08/09/2021   BILITOT 0.8 08/09/2021   Lab Results  Component Value Date   CHOL 120 01/30/2017   HDL 47 01/30/2017   LDLCALC 58 01/30/2017   TRIG 73 01/30/2017   CHOLHDL 2.6 01/30/2017    Lab Results  Component Value Date   HGBA1C 7.7 (H) 02/24/2021    Assessment & Plan    1.  ***   Murray Hodgkins, NP 11/11/2021, 7:31 AM

## 2021-11-15 ENCOUNTER — Other Ambulatory Visit: Payer: Self-pay | Admitting: Internal Medicine

## 2021-11-15 DIAGNOSIS — N390 Urinary tract infection, site not specified: Secondary | ICD-10-CM

## 2021-11-21 ENCOUNTER — Other Ambulatory Visit: Payer: Self-pay | Admitting: Internal Medicine

## 2021-11-21 DIAGNOSIS — R339 Retention of urine, unspecified: Secondary | ICD-10-CM

## 2021-11-21 DIAGNOSIS — N390 Urinary tract infection, site not specified: Secondary | ICD-10-CM

## 2021-11-25 ENCOUNTER — Ambulatory Visit: Admission: RE | Admit: 2021-11-25 | Payer: Medicare HMO | Source: Ambulatory Visit

## 2021-11-26 ENCOUNTER — Ambulatory Visit: Payer: Medicare HMO | Admitting: Anesthesiology

## 2021-12-03 ENCOUNTER — Ambulatory Visit: Payer: Medicare HMO | Admitting: Anesthesiology

## 2021-12-04 ENCOUNTER — Encounter: Payer: Self-pay | Admitting: Anesthesiology

## 2021-12-04 ENCOUNTER — Ambulatory Visit: Payer: Medicare HMO | Attending: Anesthesiology | Admitting: Anesthesiology

## 2021-12-04 VITALS — BP 188/85 | HR 74 | Temp 96.7°F | Resp 20 | Ht 71.0 in | Wt 260.0 lb

## 2021-12-04 DIAGNOSIS — M47817 Spondylosis without myelopathy or radiculopathy, lumbosacral region: Secondary | ICD-10-CM

## 2021-12-04 DIAGNOSIS — M4716 Other spondylosis with myelopathy, lumbar region: Secondary | ICD-10-CM

## 2021-12-04 DIAGNOSIS — F1190 Opioid use, unspecified, uncomplicated: Secondary | ICD-10-CM | POA: Diagnosis present

## 2021-12-04 DIAGNOSIS — M5431 Sciatica, right side: Secondary | ICD-10-CM

## 2021-12-04 DIAGNOSIS — M5432 Sciatica, left side: Secondary | ICD-10-CM | POA: Insufficient documentation

## 2021-12-04 DIAGNOSIS — G894 Chronic pain syndrome: Secondary | ICD-10-CM

## 2021-12-04 DIAGNOSIS — M5136 Other intervertebral disc degeneration, lumbar region: Secondary | ICD-10-CM | POA: Diagnosis present

## 2021-12-04 DIAGNOSIS — M48062 Spinal stenosis, lumbar region with neurogenic claudication: Secondary | ICD-10-CM | POA: Diagnosis present

## 2021-12-04 DIAGNOSIS — M51369 Other intervertebral disc degeneration, lumbar region without mention of lumbar back pain or lower extremity pain: Secondary | ICD-10-CM

## 2021-12-04 MED ORDER — HYDROCODONE-ACETAMINOPHEN 7.5-325 MG PO TABS: 1.0000 | ORAL_TABLET | Freq: Four times a day (QID) | ORAL | 0 refills | Status: DC | PRN

## 2021-12-04 NOTE — Progress Notes (Signed)
Nursing Pain Medication Assessment:  Safety precautions to be maintained throughout the outpatient stay will include: orient to surroundings, keep bed in low position, maintain call bell within reach at all times, provide assistance with transfer out of bed and ambulation.  Medication Inspection Compliance: Tina Hayes did not comply with our request to bring her pills to be counted. She was reminded that bringing the medication bottles, even when empty, is a requirement.  Medication: None brought in. Pill/Patch Count: None available to be counted. Bottle Appearance: No container available. Did not bring bottle(s) to appointment. Filled Date: N/A Last Medication intake:  Yesterday

## 2021-12-04 NOTE — Patient Instructions (Signed)
Stop Plavix for 7 days prior to procedure Bring a driver

## 2021-12-04 NOTE — Progress Notes (Signed)
Subjective:  Patient ID: Tina Hayes, female    DOB: [birth year]  Age: 76 y.o. MRN: [id number]  CC: Back Pain (low)   Procedure: None  HPI Tina Hayes presents for reevaluation.  She is continue to have a lot of low back pain especially when she is standing for any period of time.  She has tried efforts at weight loss and had some marginal success.  She is taking her medications as prescribed and these continue to help with her pain.  She has failed more conservative therapy and has had previous epidurals for this pain in the past.  She reports that when she has these she gets about 75% improvement in her low back pain lasting about 2 to 3 months before she has recurrence.  Her last epidural was several months ago.  She has inquired whether she can have a repeat injection sometime soon.  In regards to her medicines, she gets good relief rated about 75% improvement lasting about 4 to 6 hours before she has recurrence of the same pain.  They do enable her to sleep better and attempt to stay active though this is problematic for her and occasionally she is wheelchair-bound.  Her strength has been stable.  No change in bowel or bladder function is noted.  Outpatient Medications Prior to Visit  Medication Sig Dispense Refill   acetaminophen (TYLENOL) 500 MG tablet Take 500 mg by mouth every 6 (six) hours as needed for headache, moderate pain or fever.     albuterol (PROVENTIL HFA;VENTOLIN HFA) 108 (90 Base) MCG/ACT inhaler Inhale 2 puffs into the lungs every 6 (six) hours as needed for wheezing or shortness of breath. 1 Inhaler 2   albuterol (PROVENTIL) (2.5 MG/3ML) 0.083% nebulizer solution Take 2.5 mg every 4 (four) hours as needed by nebulization for wheezing or shortness of breath.     amLODipine (NORVASC) 5 MG tablet Take 1 tablet (5 mg total) by mouth daily. 90 tablet 3   atorvastatin (LIPITOR) 40 MG tablet Take 1 tablet (40 mg total) by mouth daily. 30 tablet 0   B-D UF III MINI PEN NEEDLES  31G X 5 MM MISC USE WITH INSULIN PEN INJECTIONS TWICE DAILY     carvedilol (COREG) 3.125 MG tablet Take 1 tablet (3.125 mg total) by mouth daily as needed. 90 tablet 3   clopidogrel (PLAVIX) 75 MG tablet Take 1 tablet (75 mg total) by mouth daily. 90 tablet 3   isosorbide mononitrate (IMDUR) 30 MG 24 hr tablet Take 1 tablet (30 mg total) by mouth daily. 180 tablet 3   LANTUS SOLOSTAR 100 UNIT/ML Solostar Pen Inject 30-40 Units into the skin See admin instructions. Inject 30 units subcutaneously in the morning at 40 units subcutaneously at bedtime.     levETIRAcetam (KEPPRA) 750 MG tablet Take 750 mg by mouth 2 (two) times daily.     losartan (COZAAR) 100 MG tablet Take 1 tablet (100 mg total) by mouth daily. 90 tablet 3   methocarbamol (ROBAXIN) 750 MG tablet TAKE 1 TABLET BY MOUTH EVERY 8 HOURS AS NEEDED FOR MUSCLE SPASM 90 tablet 0   nitroGLYCERIN (NITROSTAT) 0.4 MG SL tablet DISSOLVE ONE TABLET UNDER THE TONGUE EVERY 5 MINUTES AS NEEDED FOR CHEST PAIN.  DO NOT EXCEED A TOTAL OF 3 DOSES IN 15 MINUTES 25 tablet 0   ondansetron (ZOFRAN) 4 MG tablet Take 4 mg by mouth as needed.     pantoprazole (PROTONIX) 40 MG tablet Take 1 tablet (40 mg  total) by mouth 2 (two) times daily. 60 tablet 1   potassium chloride SA (KLOR-CON) 20 MEQ tablet Take 2 tablets (40 mEq total) by mouth 2 (two) times daily. Take extra 2 tablets (40 meq) if you take metolazone 140 tablet 5   sucralfate (CARAFATE) 1 g tablet Take 1 tablet (1 g total) by mouth 4 (four) times daily as needed (for abdominal discomfort, nausea, and/or vomiting). 30 tablet 1   tiotropium (SPIRIVA) 18 MCG inhalation capsule Place 18 mcg into inhaler and inhale daily.     torsemide (DEMADEX) 20 MG tablet Take 1 tablet (20 mg) by mouth twice daily     traZODone (DESYREL) 50 MG tablet Take 50 mg by mouth at bedtime.     No facility-administered medications prior to visit.    Review of Systems CNS: No confusion or sedation Cardiac: No angina or  palpitations GI: No abdominal pain or constipation Constitutional: No nausea vomiting fevers or chills  Objective:  BP (!) 188/85   Pulse 74   Temp (!) 96.7 F (35.9 C)   Resp 20   Ht '5\' 11"'$  (1.803 m)   Wt 260 lb (117.9 kg)   SpO2 100%   BMI 36.26 kg/m    BP Readings from Last 3 Encounters:  12/04/21 (!) 188/85  10/24/21 (!) 160/90  08/09/21 (!) 178/87     Wt Readings from Last 3 Encounters:  12/04/21 260 lb (117.9 kg)  10/24/21 267 lb (121.1 kg)  08/09/21 261 lb (118.4 kg)     Physical Exam Pt is alert and oriented PERRL EOMI HEART IS RRR no murmur or rub LCTA no wheezing or rales MUSCULOSKELETAL reveals normal muscle tone and bulk.  She is in a wheelchair this morning  Labs  Lab Results  Component Value Date   HGBA1C 7.7 (H) 02/24/2021   HGBA1C 6.5 (H) 01/18/2019   HGBA1C 7.5 (H) 05/27/2018   Lab Results  Component Value Date   LDLCALC 58 01/30/2017   CREATININE 0.78 08/09/2021    -------------------------------------------------------------------------------------------------------------------- Lab Results  Component Value Date   WBC 4.0 08/09/2021   HGB 8.8 (L) 08/09/2021   HCT 29.7 (L) 08/09/2021   PLT 178 08/09/2021   GLUCOSE 232 (H) 08/09/2021   CHOL 120 01/30/2017   TRIG 73 01/30/2017   HDL 47 01/30/2017   LDLCALC 58 01/30/2017   ALT 15 08/09/2021   AST 22 08/09/2021   NA 142 08/09/2021   K 4.4 08/09/2021   CL 108 08/09/2021   CREATININE 0.78 08/09/2021   BUN 13 08/09/2021   CO2 26 08/09/2021   TSH 2.640 07/13/2020   INR 1.0 08/09/2021   HGBA1C 7.7 (H) 02/24/2021    --------------------------------------------------------------------------------------------------------------------- DG Thoracic Spine 2 View  Result Date: 08/09/2021 CLINICAL DATA:  LEFT leg and RIGHT neck pain post fall down steps EXAM: THORACIC SPINE 2 VIEWS COMPARISON:  None FINDINGS: Osseous demineralization. Dextroconvex thoracic scoliosis. 12 pairs of ribs.  Vertebral body heights maintained. Multilevel disc space narrowing and endplate spur formation. No fracture, subluxation, or bone destruction. IMPRESSION: Osseous demineralization with multilevel degenerative disc disease changes and dextroconvex scoliosis thoracic spine. No acute abnormalities. Electronically Signed   By: Lavonia Dana M.D.   On: 08/09/2021 16:48   DG Lumbar Spine Complete  Result Date: 08/09/2021 CLINICAL DATA:  [place] backwards down 4 steps, LEFT leg pain EXAM: LUMBAR SPINE - COMPLETE 4+ VIEW COMPARISON:  CT abdomen and pelvis 05/29/2021 FINDINGS: Osseous demineralization. Five non-rib-bearing lumbar vertebra with levoconvex scoliosis. Multilevel disc space narrowing  and endplate spur formation. Vacuum phenomenon at L3-L4 and L4-L5. No fracture, subluxation, bone destruction, or spondylolysis. Scattered facet degenerative changes lower lumbar spine. SI joints preserved. Atherosclerotic calcification aorta. IMPRESSION: Osseous demineralization with degenerative disc and facet disease changes lumbar spine. No acute abnormalities. Electronically Signed   By: Lavonia Dana M.D.   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 Cardiac silhouette is unremarkable given AP positioning. No acute airspace disease, effusion, or pneumothorax. Stable hiatal hernia. No acute bony abnormality. IMPRESSION: 1. Stable hiatal hernia. 2. No acute intrathoracic process. Electronically Signed   By: Randa Ngo M.D.   On: 08/09/2021 16:46   DG Hip Unilat W or Wo Pelvis 2-3 Views Left  Result Date: 08/09/2021 CLINICAL DATA:  fall EXAM: DG HIP (WITH OR WITHOUT PELVIS) 2-3V LEFT COMPARISON:  None. FINDINGS: There is no evidence of hip fracture or dislocation. Mild bilateral hip degenerative  change. Partially imaged lower lumbar degenerative change. IMPRESSION: No evidence of acute fracture or joint dislocation. Electronically Signed   By: Margaretha Sheffield M.D.   On: 08/09/2021 16:46   CT Cervical Spine Wo Contrast  Result Date: 08/09/2021 CLINICAL DATA:  Neck trauma (Age >= 65y) Fall backwards down 4 steps.  Right-sided neck pain. EXAM: CT CERVICAL SPINE WITHOUT CONTRAST TECHNIQUE: Multidetector CT imaging of the cervical spine was performed without intravenous contrast. Multiplanar CT image reconstructions were also generated. RADIATION DOSE REDUCTION: This exam was performed according to the departmental dose-optimization program which includes automated exposure control, adjustment of the mA and/or kV according to patient size and/or use of iterative reconstruction technique. COMPARISON:  None. FINDINGS: Alignment: Straightening of normal lordosis. No traumatic subluxation. Skull base and vertebrae: Motion artifact at the skull base and C2. Allowing for this, no evidence of acute fracture. C1-C2 degenerative change with pannus. Soft tissues and spinal canal: No prevertebral fluid or swelling. No visible canal hematoma. Disc levels: Diffuse degenerative disc disease with disc space narrowing and endplate spurring. Multilevel facet hypertrophy. Upper chest: No acute or unexpected finding. Other: Carotid calcifications IMPRESSION: 1. No evidence of acute fracture or traumatic subluxation of the cervical spine. 2. Multilevel degenerative disc disease and facet hypertrophy. Electronically Signed   By: Keith Rake M.D.   On: 08/09/2021 16:11   CT HEAD WO CONTRAST (5MM)  Result Date: 08/09/2021 CLINICAL DATA:  Head trauma [place] backwards down 4 steps Left leg and right neck pain EXAM: CT HEAD WITHOUT CONTRAST TECHNIQUE: Contiguous axial images were obtained from the base of the skull through the vertex without intravenous contrast. RADIATION DOSE REDUCTION: This exam was performed according to  the departmental dose-optimization program which includes automated exposure control, adjustment of the mA and/or kV according to patient size and/or use of iterative reconstruction technique. COMPARISON:  05/25/2018 FINDINGS: Brain: No evidence of acute infarction, hemorrhage, hydrocephalus, extra-axial collection or mass lesion/mass effect. Periventricular white matter hypodensity is a nonspecific finding, but most commonly relates to chronic ischemic small vessel disease. Vascular: No hyperdense vessel or unexpected calcification. Skull: Normal. Negative for fracture or focal lesion. Sinuses/Orbits: No acute finding. Other: None. IMPRESSION: No acute intracranial abnormality. Electronically Signed   By: Miachel Roux M.D.   On: 08/09/2021 16:10     Assessment & Plan:   Tina Hayes was seen today for  back pain.  Diagnoses and all orders for this visit:  Bilateral sciatica -     Lumbar Epidural Injection; Future  Lumbar spondylosis with myelopathy -     Lumbar Epidural Injection; Future  DDD (degenerative disc disease), lumbar -     Lumbar Epidural Injection; Future  Spinal stenosis of lumbar region with neurogenic claudication -     Lumbar Epidural Injection; Future  Chronic, continuous use of opioids  Chronic pain syndrome  Facet arthritis of lumbosacral region  Morbid obesity ([place])  Other orders -     HYDROcodone-acetaminophen (NORCO) 7.5-325 MG tablet; Take 1 tablet by mouth every 6 (six) hours as needed for moderate pain or severe pain. -     HYDROcodone-acetaminophen (NORCO) 7.5-325 MG tablet; Take 1 tablet by mouth every 6 (six) hours as needed for moderate pain or severe pain.        ----------------------------------------------------------------------------------------------------------------------  Problem List Items Addressed This Visit       Unprioritized   Morbid obesity ([place])   Other Visit Diagnoses     Bilateral sciatica    -  Primary   Relevant Orders    Lumbar Epidural Injection   Lumbar spondylosis with myelopathy       Relevant Medications   HYDROcodone-acetaminophen (NORCO) 7.5-325 MG tablet   HYDROcodone-acetaminophen (NORCO) 7.5-325 MG tablet (Start on 01/03/2022)   Other Relevant Orders   Lumbar Epidural Injection   DDD (degenerative disc disease), lumbar       Relevant Medications   HYDROcodone-acetaminophen (NORCO) 7.5-325 MG tablet   HYDROcodone-acetaminophen (NORCO) 7.5-325 MG tablet (Start on 01/03/2022)   Other Relevant Orders   Lumbar Epidural Injection   Spinal stenosis of lumbar region with neurogenic claudication       Relevant Medications   HYDROcodone-acetaminophen (NORCO) 7.5-325 MG tablet   HYDROcodone-acetaminophen (NORCO) 7.5-325 MG tablet (Start on 01/03/2022)   Other Relevant Orders   Lumbar Epidural Injection   Chronic, continuous use of opioids       Chronic pain syndrome       Relevant Medications   HYDROcodone-acetaminophen (NORCO) 7.5-325 MG tablet   HYDROcodone-acetaminophen (NORCO) 7.5-325 MG tablet (Start on 01/03/2022)   Facet arthritis of lumbosacral region       Relevant Medications   HYDROcodone-acetaminophen (NORCO) 7.5-325 MG tablet   HYDROcodone-acetaminophen (NORCO) 7.5-325 MG tablet (Start on 01/03/2022)         ----------------------------------------------------------------------------------------------------------------------  1. Bilateral sciatica Will schedule her for a 1 month return for repeat epidural.  She did discontinue her Plavix 10 days prior to today's evaluation as she was unsure whether she was having a procedure today.  I have cautioned her against this and that she should discontinue medications only if she has verification that she is having a procedure.  I want her to restart her Plavix today and discontinue 10 days prior to her next visit as reviewed today.  She will confirm her procedure prior to discontinuing next time.  Continue efforts at weight loss and continue stretching  with medication management - Lumbar Epidural Injection; Future  2. Lumbar spondylosis with myelopathy As above - Lumbar Epidural Injection; Future  3. DDD (degenerative disc disease), lumbar  - Lumbar Epidural Injection; Future  4. Spinal stenosis of lumbar region with neurogenic claudication As above - Lumbar Epidural Injection; Future  5. Chronic, continuous use of opioids I have reviewed the [place] and it is appropriate for refills dated for August 9 and September 8.  No other changes in her pharmacologic  regimen are initiated.  6. Chronic pain syndrome   7. Facet arthritis of lumbosacral region   8. Morbid obesity ([place]) Continue efforts at weight loss and continue follow-up with her primary care physician for baseline medical care    ----------------------------------------------------------------------------------------------------------------------  I am having Tina Hayes. Foree start on HYDROcodone-acetaminophen and HYDROcodone-acetaminophen. I am also having her maintain her tiotropium, traZODone, albuterol, albuterol, nitroGLYCERIN, levETIRAcetam, B-D UF III MINI PEN NEEDLES, clopidogrel, Lantus SoloStar, potassium chloride SA, ondansetron, torsemide, carvedilol, losartan, acetaminophen, isosorbide mononitrate, pantoprazole, sucralfate, atorvastatin, methocarbamol, and amLODipine.   Meds ordered this encounter  Medications   HYDROcodone-acetaminophen (NORCO) 7.5-325 MG tablet    Sig: Take 1 tablet by mouth every 6 (six) hours as needed for moderate pain or severe pain.    Dispense:  120 tablet    Refill:  0   HYDROcodone-acetaminophen (NORCO) 7.5-325 MG tablet    Sig: Take 1 tablet by mouth every 6 (six) hours as needed for moderate pain or severe pain.    Dispense:  120 tablet    Refill:  0   Patient's Medications  New Prescriptions   HYDROCODONE-ACETAMINOPHEN (NORCO) 7.5-325 MG TABLET    Take 1 tablet by mouth every 6 (six) hours  as needed for moderate pain or severe pain.   HYDROCODONE-ACETAMINOPHEN (NORCO) 7.5-325 MG TABLET    Take 1 tablet by mouth every 6 (six) hours as needed for moderate pain or severe pain.  Previous Medications   ACETAMINOPHEN (TYLENOL) 500 MG TABLET    Take 500 mg by mouth every 6 (six) hours as needed for headache, moderate pain or fever.   ALBUTEROL (PROVENTIL HFA;VENTOLIN HFA) 108 (90 BASE) MCG/ACT INHALER    Inhale 2 puffs into the lungs every 6 (six) hours as needed for wheezing or shortness of breath.   ALBUTEROL (PROVENTIL) (2.5 MG/3ML) 0.083% NEBULIZER SOLUTION    Take 2.5 mg every 4 (four) hours as needed by nebulization for wheezing or shortness of breath.   AMLODIPINE (NORVASC) 5 MG TABLET    Take 1 tablet (5 mg total) by mouth daily.   ATORVASTATIN (LIPITOR) 40 MG TABLET    Take 1 tablet (40 mg total) by mouth daily.   B-D UF III MINI PEN NEEDLES 31G X 5 MM MISC    USE WITH INSULIN PEN INJECTIONS TWICE DAILY   CARVEDILOL (COREG) 3.125 MG TABLET    Take 1 tablet (3.125 mg total) by mouth daily as needed.   CLOPIDOGREL (PLAVIX) 75 MG TABLET    Take 1 tablet (75 mg total) by mouth daily.   ISOSORBIDE MONONITRATE (IMDUR) 30 MG 24 HR TABLET    Take 1 tablet (30 mg total) by mouth daily.   LANTUS SOLOSTAR 100 UNIT/ML SOLOSTAR PEN    Inject 30-40 Units into the skin See admin instructions. Inject 30 units subcutaneously in the morning at 40 units subcutaneously at bedtime.   LEVETIRACETAM (KEPPRA) 750 MG TABLET    Take 750 mg by mouth 2 (two) times daily.   LOSARTAN (COZAAR) 100 MG TABLET    Take 1 tablet (100 mg total) by mouth daily.   METHOCARBAMOL (ROBAXIN) 750 MG TABLET    TAKE 1 TABLET BY MOUTH EVERY 8 HOURS AS NEEDED FOR MUSCLE SPASM   NITROGLYCERIN (NITROSTAT) 0.4 MG SL TABLET    DISSOLVE ONE TABLET UNDER THE TONGUE EVERY 5 MINUTES AS NEEDED FOR CHEST PAIN.  DO NOT EXCEED A TOTAL OF 3 DOSES IN 15 MINUTES   ONDANSETRON (ZOFRAN) 4 MG TABLET  Take 4 mg by mouth as needed.   PANTOPRAZOLE  (PROTONIX) 40 MG TABLET    Take 1 tablet (40 mg total) by mouth 2 (two) times daily.   POTASSIUM CHLORIDE SA (KLOR-CON) 20 MEQ TABLET    Take 2 tablets (40 mEq total) by mouth 2 (two) times daily. Take extra 2 tablets (40 meq) if you take metolazone   SUCRALFATE (CARAFATE) 1 G TABLET    Take 1 tablet (1 g total) by mouth 4 (four) times daily as needed (for abdominal discomfort, nausea, and/or vomiting).   TIOTROPIUM (SPIRIVA) 18 MCG INHALATION CAPSULE    Place 18 mcg into inhaler and inhale daily.   TORSEMIDE (DEMADEX) 20 MG TABLET    Take 1 tablet (20 mg) by mouth twice daily   TRAZODONE (DESYREL) 50 MG TABLET    Take 50 mg by mouth at bedtime.  Modified Medications   No medications on file  Discontinued Medications   No medications on file   ----------------------------------------------------------------------------------------------------------------------  Follow-up: Return in about 1 month (around 01/04/2022) for evaluation.    Molli Barrows, MD

## 2021-12-18 ENCOUNTER — Ambulatory Visit: Admission: RE | Admit: 2021-12-18 | Payer: Medicare HMO | Source: Ambulatory Visit

## 2021-12-31 ENCOUNTER — Encounter: Payer: Self-pay | Admitting: Anesthesiology

## 2021-12-31 ENCOUNTER — Ambulatory Visit (HOSPITAL_BASED_OUTPATIENT_CLINIC_OR_DEPARTMENT_OTHER): Payer: Medicare Other | Admitting: Anesthesiology

## 2021-12-31 ENCOUNTER — Ambulatory Visit
Admission: RE | Admit: 2021-12-31 | Discharge: 2021-12-31 | Disposition: A | Discharge status: Home / Self Care | Payer: Medicare Other | Source: Ambulatory Visit | Attending: Anesthesiology | Admitting: Anesthesiology

## 2021-12-31 ENCOUNTER — Other Ambulatory Visit: Payer: Self-pay | Admitting: Anesthesiology

## 2021-12-31 DIAGNOSIS — M48062 Spinal stenosis, lumbar region with neurogenic claudication: Secondary | ICD-10-CM

## 2021-12-31 DIAGNOSIS — M5136 Other intervertebral disc degeneration, lumbar region: Secondary | ICD-10-CM | POA: Diagnosis present

## 2021-12-31 DIAGNOSIS — R52 Pain, unspecified: Secondary | ICD-10-CM | POA: Diagnosis present

## 2021-12-31 DIAGNOSIS — M51369 Other intervertebral disc degeneration, lumbar region without mention of lumbar back pain or lower extremity pain: Secondary | ICD-10-CM

## 2021-12-31 DIAGNOSIS — M4716 Other spondylosis with myelopathy, lumbar region: Secondary | ICD-10-CM

## 2021-12-31 DIAGNOSIS — M5431 Sciatica, right side: Secondary | ICD-10-CM

## 2021-12-31 DIAGNOSIS — M5432 Sciatica, left side: Secondary | ICD-10-CM | POA: Insufficient documentation

## 2021-12-31 MED ORDER — ROPIVACAINE HCL 2 MG/ML IJ SOLN
10.0000 mL | Freq: Once | INTRAMUSCULAR | Status: AC
  Administered 2021-12-31: 10 mL via EPIDURAL

## 2021-12-31 MED ORDER — TRIAMCINOLONE ACETONIDE 40 MG/ML IJ SUSP
INTRAMUSCULAR | Status: AC
Start: 2021-12-31 — End: ?
  Filled 2021-12-31: qty 1

## 2021-12-31 MED ORDER — MIDAZOLAM HCL 2 MG/2ML IJ SOLN
2.0000 mg | Freq: Once | INTRAMUSCULAR | Status: AC
  Administered 2021-12-31: 2 mg via INTRAVENOUS

## 2021-12-31 MED ORDER — SODIUM CHLORIDE (PF) 0.9 % IJ SOLN
INTRAMUSCULAR | Status: AC
  Filled 2021-12-31: qty 10

## 2021-12-31 MED ORDER — MIDAZOLAM HCL 5 MG/5ML IJ SOLN
INTRAMUSCULAR | Status: AC
  Filled 2021-12-31: qty 5

## 2021-12-31 MED ORDER — LIDOCAINE HCL (PF) 1 % IJ SOLN
INTRAMUSCULAR | Status: AC
  Filled 2021-12-31: qty 60

## 2021-12-31 MED ORDER — IOHEXOL 180 MG/ML  SOLN
10.0000 mL | Freq: Once | INTRAMUSCULAR | Status: AC | PRN
  Administered 2021-12-31: 10 mL via EPIDURAL

## 2021-12-31 MED ORDER — IOHEXOL 180 MG/ML  SOLN
INTRAMUSCULAR | Status: AC
Start: 2021-12-31 — End: ?
  Filled 2021-12-31: qty 20

## 2021-12-31 MED ORDER — ROPIVACAINE HCL 2 MG/ML IJ SOLN
INTRAMUSCULAR | Status: AC
  Filled 2021-12-31: qty 20

## 2021-12-31 MED ORDER — TRIAMCINOLONE ACETONIDE 40 MG/ML IJ SUSP
40.0000 mg | Freq: Once | INTRAMUSCULAR | Status: AC
  Administered 2021-12-31: 40 mg

## 2021-12-31 MED ORDER — TRIAMCINOLONE ACETONIDE 40 MG/ML IJ SUSP
INTRAMUSCULAR | Status: AC
  Filled 2021-12-31: qty 1

## 2021-12-31 MED ORDER — SODIUM CHLORIDE 0.9% FLUSH
10.0000 mL | Freq: Once | INTRAVENOUS | Status: AC
  Administered 2021-12-31: 10 mL

## 2021-12-31 MED ORDER — LIDOCAINE HCL (PF) 1 % IJ SOLN
5.0000 mL | Freq: Once | INTRAMUSCULAR | Status: AC
  Administered 2021-12-31: 5 mL via SUBCUTANEOUS

## 2021-12-31 NOTE — Patient Instructions (Signed)

## 2021-12-31 NOTE — Progress Notes (Unsigned)
Nursing Pain Medication Assessment:  Safety precautions to be maintained throughout the outpatient stay will include: orient to surroundings, keep bed in low position, maintain call bell within reach at all times, provide assistance with transfer out of bed and ambulation.  Medication Inspection Compliance: Pill count conducted under aseptic conditions, in front of the patient. Neither the pills nor the bottle was removed from the patient's sight at any time. Once count was completed pills were immediately returned to the patient in their original bottle.  Medication: Hydrocodone/APAP Pill/Patch Count:  1 of 120 pills remain Pill/Patch Appearance: Markings consistent with prescribed medication Bottle Appearance: Standard pharmacy container. Clearly labeled. Filled Date: 08 / 09 / 2023 Last Medication intake:  Today

## 2022-01-01 ENCOUNTER — Telehealth: Payer: Self-pay

## 2022-01-01 ENCOUNTER — Encounter: Payer: Self-pay | Admitting: Anesthesiology

## 2022-01-01 MED ORDER — HYDROCODONE-ACETAMINOPHEN 7.5-325 MG PO TABS: 1.0000 | ORAL_TABLET | Freq: Four times a day (QID) | ORAL | 0 refills | Status: DC | PRN

## 2022-01-01 NOTE — Telephone Encounter (Signed)
Post procedure phone call.  LM 

## 2022-01-01 NOTE — Progress Notes (Signed)
Subjective:  Patient ID: Tina Hayes, female    DOB: 1945-11-23  Age: 76 y.o. MRN: [id number]  CC: Back Pain (low)   Procedure: L5-S1 epidural steroid with minimal sedation  HPI Tina Hayes presents for reevaluation.  Tina Hayes has a longstanding history of low back pain with sciatica symptoms affecting both legs with bilateral hips.  She receives periodic epidural steroid injections to help with management of her pain and to enable her to stay more active.  Her last epidural was back in February and she presents today requesting a repeat epidural.  She still getting sciatica symptoms affecting both bilateral legs and bilateral hips with centralized low back pain.  This pain is severe and has been slightly improved with her opioid medications but the epidurals she reports are most effective for her.  The epidural generally gets rid of of good part of the sciatica rated at 75% improvement for about 3 months.  Her back pain improves but the opioid medications keep done under better control.  She takes these as prescribed without side effect reported.  No change in lower extremity strength function or bowel or bladder function is noted.  She has been off her Plavix for the appropriate amount of time.  Outpatient Medications Prior to Visit  Medication Sig Dispense Refill   acetaminophen (TYLENOL) 500 MG tablet Take 500 mg by mouth every 6 (six) hours as needed for headache, moderate pain or fever.     albuterol (PROVENTIL HFA;VENTOLIN HFA) 108 (90 Base) MCG/ACT inhaler Inhale 2 puffs into the lungs every 6 (six) hours as needed for wheezing or shortness of breath. 1 Inhaler 2   albuterol (PROVENTIL) (2.5 MG/3ML) 0.083% nebulizer solution Take 2.5 mg every 4 (four) hours as needed by nebulization for wheezing or shortness of breath.     amLODipine (NORVASC) 5 MG tablet Take 1 tablet (5 mg total) by mouth daily. 90 tablet 3   atorvastatin (LIPITOR) 40 MG tablet Take 1 tablet (40 mg total) by mouth  daily. 30 tablet 0   B-D UF III MINI PEN NEEDLES 31G X 5 MM MISC USE WITH INSULIN PEN INJECTIONS TWICE DAILY     carvedilol (COREG) 3.125 MG tablet Take 1 tablet (3.125 mg total) by mouth daily as needed. 90 tablet 3   clopidogrel (PLAVIX) 75 MG tablet Take 1 tablet (75 mg total) by mouth daily. 90 tablet 3   HYDROcodone-acetaminophen (NORCO) 7.5-325 MG tablet Take 1 tablet by mouth every 6 (six) hours as needed for moderate pain or severe pain. 120 tablet 0   [START ON 01/03/2022] HYDROcodone-acetaminophen (NORCO) 7.5-325 MG tablet Take 1 tablet by mouth every 6 (six) hours as needed for moderate pain or severe pain. 120 tablet 0   isosorbide mononitrate (IMDUR) 30 MG 24 hr tablet Take 1 tablet (30 mg total) by mouth daily. 180 tablet 3   LANTUS SOLOSTAR 100 UNIT/ML Solostar Pen Inject 30-40 Units into the skin See admin instructions. Inject 30 units subcutaneously in the morning at 40 units subcutaneously at bedtime.     levETIRAcetam (KEPPRA) 750 MG tablet Take 750 mg by mouth 2 (two) times daily.     losartan (COZAAR) 100 MG tablet Take 1 tablet (100 mg total) by mouth daily. 90 tablet 3   methocarbamol (ROBAXIN) 750 MG tablet TAKE 1 TABLET BY MOUTH EVERY 8 HOURS AS NEEDED FOR MUSCLE SPASM 90 tablet 0   nitroGLYCERIN (NITROSTAT) 0.4 MG SL tablet DISSOLVE ONE TABLET UNDER THE TONGUE EVERY 5 MINUTES  AS NEEDED FOR CHEST PAIN.  DO NOT EXCEED A TOTAL OF 3 DOSES IN 15 MINUTES 25 tablet 0   ondansetron (ZOFRAN) 4 MG tablet Take 4 mg by mouth as needed.     pantoprazole (PROTONIX) 40 MG tablet Take 1 tablet (40 mg total) by mouth 2 (two) times daily. 60 tablet 1   potassium chloride SA (KLOR-CON) 20 MEQ tablet Take 2 tablets (40 mEq total) by mouth 2 (two) times daily. Take extra 2 tablets (40 meq) if you take metolazone 140 tablet 5   sucralfate (CARAFATE) 1 g tablet Take 1 tablet (1 g total) by mouth 4 (four) times daily as needed (for abdominal discomfort, nausea, and/or vomiting). 30 tablet 1    tiotropium (SPIRIVA) 18 MCG inhalation capsule Place 18 mcg into inhaler and inhale daily.     torsemide (DEMADEX) 20 MG tablet Take 1 tablet (20 mg) by mouth twice daily     traZODone (DESYREL) 50 MG tablet Take 50 mg by mouth at bedtime.     No facility-administered medications prior to visit.    Review of Systems CNS: No confusion or sedation Cardiac: No angina or palpitations GI: No abdominal pain or constipation Constitutional: No nausea vomiting fevers or chills  Objective:  BP (!) 168/85   Pulse 66   Temp (!) 97.1 F (36.2 C) (Temporal)   Resp 16   Ht '5\' 11"'$  (1.803 m)   Wt 262 lb (118.8 kg)   SpO2 100%   BMI 36.54 kg/m    BP Readings from Last 3 Encounters:  12/31/21 (!) 168/85  12/04/21 (!) 188/85  10/24/21 (!) 160/90     Wt Readings from Last 3 Encounters:  12/31/21 262 lb (118.8 kg)  12/04/21 260 lb (117.9 kg)  10/24/21 267 lb (121.1 kg)     Physical Exam Pt is alert and oriented PERRL EOMI HEART IS RRR no murmur or rub LCTA no wheezing or rales MUSCULOSKELETAL reveals some paraspinous muscle tenderness but no overt trigger points.  She ambulates with an antalgic gait and uses a motorized wheelchair for assistance muscle tone and bulk is at baseline.  Labs  Lab Results  Component Value Date   HGBA1C 7.7 (H) 02/24/2021   HGBA1C 6.5 (H) 01/18/2019   HGBA1C 7.5 (H) 05/27/2018   Lab Results  Component Value Date   LDLCALC 58 01/30/2017   CREATININE 0.78 08/09/2021    -------------------------------------------------------------------------------------------------------------------- Lab Results  Component Value Date   WBC 4.0 08/09/2021   HGB 8.8 (L) 08/09/2021   HCT 29.7 (L) 08/09/2021   PLT 178 08/09/2021   GLUCOSE 232 (H) 08/09/2021   CHOL 120 01/30/2017   TRIG 73 01/30/2017   HDL 47 01/30/2017   LDLCALC 58 01/30/2017   ALT 15 08/09/2021   AST 22 08/09/2021   NA 142 08/09/2021   K 4.4 08/09/2021   CL 108 08/09/2021   CREATININE  0.78 08/09/2021   BUN 13 08/09/2021   CO2 26 08/09/2021   TSH 2.640 07/13/2020   INR 1.0 08/09/2021   HGBA1C 7.7 (H) 02/24/2021    --------------------------------------------------------------------------------------------------------------------- DG PAIN CLINIC C-ARM 1-60 MIN NO REPORT  Result Date: 12/31/2021 Fluoro was used, but no Radiologist interpretation will be provided. Please refer to "NOTES" tab for provider progress note.    Assessment & Plan:   Jerris was seen today for back pain.  Diagnoses and all orders for this visit:  Bilateral sciatica -     Lumbar Epidural Injection  Lumbar spondylosis with myelopathy -  Lumbar Epidural Injection  DDD (degenerative disc disease), lumbar -     Lumbar Epidural Injection  Spinal stenosis of lumbar region with neurogenic claudication -     Lumbar Epidural Injection  Other orders -     triamcinolone acetonide (KENALOG-40) injection 40 mg -     sodium chloride flush (NS) 0.9 % injection 10 mL -     ropivacaine (PF) 2 mg/mL (0.2%) (NAROPIN) injection 10 mL -     midazolam (VERSED) injection 2 mg -     lidocaine (PF) (XYLOCAINE) 1 % injection 5 mL -     iohexol (OMNIPAQUE) 180 MG/ML injection 10 mL        ----------------------------------------------------------------------------------------------------------------------  Problem List Items Addressed This Visit   None Visit Diagnoses     Bilateral sciatica       Relevant Medications   midazolam (VERSED) injection 2 mg (Completed)   Lumbar spondylosis with myelopathy       Relevant Medications   triamcinolone acetonide (KENALOG-40) injection 40 mg (Completed)   DDD (degenerative disc disease), lumbar       Relevant Medications   triamcinolone acetonide (KENALOG-40) injection 40 mg (Completed)   Spinal stenosis of lumbar region with neurogenic claudication       Relevant Medications   triamcinolone acetonide (KENALOG-40) injection 40 mg (Completed)    ropivacaine (PF) 2 mg/mL (0.2%) (NAROPIN) injection 10 mL (Completed)   lidocaine (PF) (XYLOCAINE) 1 % injection 5 mL (Completed)         ----------------------------------------------------------------------------------------------------------------------  1. Bilateral sciatica Proceed with a repeat epidural today.  The risks and benefits of been reviewed all questions answered.  We will schedule her for return to clinic in 2 months with a repeat epidural possibly in 3 months.  Continue efforts at weight loss stretching strengthening as reviewed and we will continue with her current medication management.  Continue follow-up with her primary care physicians for baseline medical care additionally. - Lumbar Epidural Injection  2. Lumbar spondylosis with myelopathy As above - Lumbar Epidural Injection  3. DDD (degenerative disc disease), lumbar As above - Lumbar Epidural Injection  4. Spinal stenosis of lumbar region with neurogenic claudication As above - Lumbar Epidural Injection    ----------------------------------------------------------------------------------------------------------------------  I am having Velora Heckler. Bossman maintain her tiotropium, traZODone, albuterol, albuterol, nitroGLYCERIN, levETIRAcetam, B-D UF III MINI PEN NEEDLES, clopidogrel, Lantus SoloStar, potassium chloride SA, ondansetron, torsemide, carvedilol, losartan, acetaminophen, isosorbide mononitrate, pantoprazole, sucralfate, atorvastatin, methocarbamol, amLODipine, HYDROcodone-acetaminophen, and HYDROcodone-acetaminophen. We administered triamcinolone acetonide, sodium chloride flush, ropivacaine (PF) 2 mg/mL (0.2%), midazolam, lidocaine (PF), and iohexol.   Meds ordered this encounter  Medications   triamcinolone acetonide (KENALOG-40) injection 40 mg   sodium chloride flush (NS) 0.9 % injection 10 mL   ropivacaine (PF) 2 mg/mL (0.2%) (NAROPIN) injection 10 mL   midazolam (VERSED) injection 2 mg    lidocaine (PF) (XYLOCAINE) 1 % injection 5 mL   iohexol (OMNIPAQUE) 180 MG/ML injection 10 mL   Patient's Medications  New Prescriptions   No medications on file  Previous Medications   ACETAMINOPHEN (TYLENOL) 500 MG TABLET    Take 500 mg by mouth every 6 (six) hours as needed for headache, moderate pain or fever.   ALBUTEROL (PROVENTIL HFA;VENTOLIN HFA) 108 (90 BASE) MCG/ACT INHALER    Inhale 2 puffs into the lungs every 6 (six) hours as needed for wheezing or shortness of breath.   ALBUTEROL (PROVENTIL) (2.5 MG/3ML) 0.083% NEBULIZER SOLUTION    Take 2.5 mg every 4 (four) hours  as needed by nebulization for wheezing or shortness of breath.   AMLODIPINE (NORVASC) 5 MG TABLET    Take 1 tablet (5 mg total) by mouth daily.   ATORVASTATIN (LIPITOR) 40 MG TABLET    Take 1 tablet (40 mg total) by mouth daily.   B-D UF III MINI PEN NEEDLES 31G X 5 MM MISC    USE WITH INSULIN PEN INJECTIONS TWICE DAILY   CARVEDILOL (COREG) 3.125 MG TABLET    Take 1 tablet (3.125 mg total) by mouth daily as needed.   CLOPIDOGREL (PLAVIX) 75 MG TABLET    Take 1 tablet (75 mg total) by mouth daily.   HYDROCODONE-ACETAMINOPHEN (NORCO) 7.5-325 MG TABLET    Take 1 tablet by mouth every 6 (six) hours as needed for moderate pain or severe pain.   HYDROCODONE-ACETAMINOPHEN (NORCO) 7.5-325 MG TABLET    Take 1 tablet by mouth every 6 (six) hours as needed for moderate pain or severe pain.   ISOSORBIDE MONONITRATE (IMDUR) 30 MG 24 HR TABLET    Take 1 tablet (30 mg total) by mouth daily.   LANTUS SOLOSTAR 100 UNIT/ML SOLOSTAR PEN    Inject 30-40 Units into the skin See admin instructions. Inject 30 units subcutaneously in the morning at 40 units subcutaneously at bedtime.   LEVETIRACETAM (KEPPRA) 750 MG TABLET    Take 750 mg by mouth 2 (two) times daily.   LOSARTAN (COZAAR) 100 MG TABLET    Take 1 tablet (100 mg total) by mouth daily.   METHOCARBAMOL (ROBAXIN) 750 MG TABLET    TAKE 1 TABLET BY MOUTH EVERY 8 HOURS AS NEEDED FOR  MUSCLE SPASM   NITROGLYCERIN (NITROSTAT) 0.4 MG SL TABLET    DISSOLVE ONE TABLET UNDER THE TONGUE EVERY 5 MINUTES AS NEEDED FOR CHEST PAIN.  DO NOT EXCEED A TOTAL OF 3 DOSES IN 15 MINUTES   ONDANSETRON (ZOFRAN) 4 MG TABLET    Take 4 mg by mouth as needed.   PANTOPRAZOLE (PROTONIX) 40 MG TABLET    Take 1 tablet (40 mg total) by mouth 2 (two) times daily.   POTASSIUM CHLORIDE SA (KLOR-CON) 20 MEQ TABLET    Take 2 tablets (40 mEq total) by mouth 2 (two) times daily. Take extra 2 tablets (40 meq) if you take metolazone   SUCRALFATE (CARAFATE) 1 G TABLET    Take 1 tablet (1 g total) by mouth 4 (four) times daily as needed (for abdominal discomfort, nausea, and/or vomiting).   TIOTROPIUM (SPIRIVA) 18 MCG INHALATION CAPSULE    Place 18 mcg into inhaler and inhale daily.   TORSEMIDE (DEMADEX) 20 MG TABLET    Take 1 tablet (20 mg) by mouth twice daily   TRAZODONE (DESYREL) 50 MG TABLET    Take 50 mg by mouth at bedtime.  Modified Medications   No medications on file  Discontinued Medications   No medications on file   ----------------------------------------------------------------------------------------------------------------------  Follow-up: No follow-ups on file.   Procedure: L5-S1 LESI with fluoroscopic guidance and minimal sedation  NOTE: The risks, benefits, and expectations of the procedure have been discussed and explained to the patient who was understanding and in agreement with suggested treatment plan. No guarantees were made.  DESCRIPTION OF PROCEDURE: Lumbar epidural steroid injection with 2 mg IV Versed, EKG, blood pressure, pulse, and pulse oximetry monitoring. The procedure was performed with the patient in the prone position under fluoroscopic guidance.  Sterile prep x3 was initiated and I then injected subcutaneous lidocaine to the overlying L5-S1 site after  its fluoroscopic identifictation.  Using strict aseptic technique, I then advanced an 18-gauge Tuohy epidural needle in the  midline using interlaminar approach via loss-of-resistance to saline technique. There was negative aspiration for heme or  CSF.  I then confirmed position with both AP and Lateral fluoroscan.  2 cc of contrast dye were injected and a  total of 5 mL of Preservative-Free normal saline mixed with 40 mg of Kenalog and 1cc Ropicaine 0.2 percent were injected incrementally via the  epidurally placed needle. The needle was removed. The patient tolerated the injection well and was convalesced and discharged to home in stable condition. Should the patient have any post procedure difficulty they have been instructed on how to contact us for assistance.    Molli Barrows, MD

## 2022-01-13 ENCOUNTER — Other Ambulatory Visit: Payer: Self-pay | Admitting: Cardiovascular Disease

## 2022-01-13 NOTE — Telephone Encounter (Signed)
Please reschedule F/U appt. Patient did not show for last scheduled office visit. Thank you!

## 2022-01-16 ENCOUNTER — Other Ambulatory Visit: Payer: Self-pay

## 2022-01-22 ENCOUNTER — Telehealth: Payer: Self-pay | Admitting: Cardiovascular Disease

## 2022-01-22 NOTE — Telephone Encounter (Signed)
LVM to schedule appt for refills, please schedule.

## 2022-01-22 NOTE — Telephone Encounter (Signed)
Pt returned call and was scheduled to see Dr. Rockey Situ 10/10 at 2:20 P.M.

## 2022-01-28 NOTE — Progress Notes (Signed)
Requested notes.

## 2022-01-31 ENCOUNTER — Other Ambulatory Visit: Payer: Self-pay

## 2022-02-03 NOTE — Progress Notes (Unsigned)
Date:  02/03/2022   ID:  [name], DOB 03/26/1946, MRN [id number]  Patient Location:  [address] [place] [address]   Provider location:   [facility], [place] office  PCP:  Crissie Figures, PA-C  Cardiologist:  [facility]  No chief complaint on file.   History of Present Illness:    Tina Hayes is a 76 y.o. 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, 24 to 76 yo  Chronic back pain,   HTN  seizure disorder  hospital admission 06/17/2016 for cough, chest pain, shortness of breath,  stress test showing no ischemia PAD: Ulceration right lower extremity, who presents  for follow-up of her chest pain, chronic diastolic CHF  Last seen by myself in clinic August 2022 Seen by one of our providers June 2023 Blood pressure was elevated, started on amlodipine  On that visit memory poor, off balance, tired, no energy, poor sleep, GERD symptoms Was taking torsemide 20 twice a day, eating more,reds vest 34  March 2022 Lexiscan stress test that was low risk and normal with EF 55-60%  Followed by pulmonary  Echocardiogram April 2022 ejection fraction 55 to 60% Mild to moderate TR  In follow-up today she reports new issues: Food hanging up, spitting up Had esophagus stretched 3 years ago 04/2017 mild Schatzki ring (acquired) was found at the gastroesophageal junction. A guidewire was placed and the scope was withdrawn. Dilation was performed with a Savary dilator with mild resistance at 16 mm. GEJ about 35cm. A medium-sized hiatal hernia was present.  On PPI, does not remember the name  Was unable to get Nexium covered by insurance, primary care prescribed a different one  Presents today in a wheelchair but reports that her knees are fine after knee replacement surgery, able to go shopping  just fine  EKG personally reviewed by myself on todays visit Normal sinus rhythm rate 49 nonspecific ST-T wave abnormality 1 and aVL, anterolateral leads  Other studies reviewed on today's visit Underwent PV study March 2021 with Dr. Lucky Cowboy    Percutaneous transluminal angioplasty of right anterior tibial artery with 3 mm diameter angioplasty balloon      Percutaneous transluminal angioplasty of the right popliteal artery with 5 mm diameter Lutonix drug-coated angioplasty balloon     Viabahn stent placement to the right popliteal artery with 6 mm diameter   Echocardiogram October 2020 ejection fraction 60% Right heart pressures 47 moderately elevated moderate MR   hospital October 2018 for chest pain shortness of breath Felt to be atypical in nature, GI related, piece of meat stuck in her throat with pain radiating to both shoulders, .  Vomiting, unable to get any food or water down  Eventually piece of meat removed and she was able to swallow  history of GERD, she had ran out of her PPI   Prior CV studies:   The following studies were reviewed today:  Current Outpatient Medications on File Prior to Visit  Medication Sig Dispense Refill   acetaminophen (TYLENOL) 500 MG tablet Take 500 mg by mouth every 6 (six) hours as needed for headache, moderate pain or fever.     albuterol (PROVENTIL HFA;VENTOLIN HFA) 108 (90 Base) MCG/ACT inhaler Inhale 2 puffs into the lungs every 6 (six) hours as needed  for wheezing or shortness of breath. 1 Inhaler 2   albuterol (PROVENTIL) (2.5 MG/3ML) 0.083% nebulizer solution Take 2.5 mg every 4 (four) hours as needed by nebulization for wheezing or shortness of breath.     amitriptyline (ELAVIL) 25 MG tablet Take 25 mg by mouth at bedtime.     amLODipine (NORVASC) 5 MG tablet Take 1 tablet (5 mg total) by mouth daily. 90 tablet 3   atorvastatin (LIPITOR) 40 MG tablet Take 1 tablet (40 mg total) by mouth daily. 30 tablet 0   B-D UF III MINI PEN NEEDLES 31G X 5  MM MISC USE WITH INSULIN PEN INJECTIONS TWICE DAILY     carvedilol (COREG) 3.125 MG tablet Take 1 tablet (3.125 mg total) by mouth daily as needed. Please call to schedule an overdue appointment with Cardiology for refills, [phone], thank you. 1st attempt. 30 tablet 0   clopidogrel (PLAVIX) 75 MG tablet Take 1 tablet (75 mg total) by mouth daily. 90 tablet 3   DUPIXENT 300 MG/2ML SOPN Inject into the skin.     [START ON 03/04/2022] HYDROcodone-acetaminophen (NORCO) 7.5-325 MG tablet Take 1 tablet by mouth every 6 (six) hours as needed for moderate pain or severe pain. 120 tablet 0   isosorbide mononitrate (IMDUR) 30 MG 24 hr tablet Take 1 tablet (30 mg total) by mouth daily. 180 tablet 3   LANTUS SOLOSTAR 100 UNIT/ML Solostar Pen Inject 30-40 Units into the skin See admin instructions. Inject 30 units subcutaneously in the morning at 40 units subcutaneously at bedtime.     levETIRAcetam (KEPPRA) 750 MG tablet Take 750 mg by mouth 2 (two) times daily.     losartan (COZAAR) 100 MG tablet Take 1 tablet (100 mg total) by mouth daily. 90 tablet 3   methocarbamol (ROBAXIN) 750 MG tablet TAKE 1 TABLET BY MOUTH EVERY 8 HOURS AS NEEDED FOR MUSCLE SPASM 90 tablet 0   nitroGLYCERIN (NITROSTAT) 0.4 MG SL tablet DISSOLVE ONE TABLET UNDER THE TONGUE EVERY 5 MINUTES AS NEEDED FOR CHEST PAIN.  DO NOT EXCEED A TOTAL OF 3 DOSES IN 15 MINUTES 25 tablet 0   ondansetron (ZOFRAN) 4 MG tablet Take 4 mg by mouth as needed.     pantoprazole (PROTONIX) 40 MG tablet Take 1 tablet (40 mg total) by mouth 2 (two) times daily. 60 tablet 1   potassium chloride SA (KLOR-CON) 20 MEQ tablet Take 2 tablets (40 mEq total) by mouth 2 (two) times daily. Take extra 2 tablets (40 meq) if you take metolazone 140 tablet 5   sertraline (ZOLOFT) 100 MG tablet Take 150 mg by mouth daily.     sucralfate (CARAFATE) 1 g tablet Take 1 tablet (1 g total) by mouth 4 (four) times daily as needed (for abdominal discomfort, nausea, and/or vomiting).  30 tablet 1   tiotropium (SPIRIVA) 18 MCG inhalation capsule Place 18 mcg into inhaler and inhale daily.     torsemide (DEMADEX) 20 MG tablet Take 1 tablet (20 mg) by mouth twice daily     traZODone (DESYREL) 50 MG tablet Take 50 mg by mouth at bedtime.     No current facility-administered medications on file prior to visit.     Past Medical History:  Diagnosis Date   (HFpEF) heart failure with preserved ejection fraction ([place])    a. 05/2016 Echo: EF 60-65%, mild to mod LVH, Gr1 DD, mild MR, mildly dil LA, mod TR, mildly to mod increased PASP; b. 07/2020 Echo: EF 55-60%, no rwma, mild LVH, GrIDD,  mildly dil LA, mild MR, mild-mod TR, mild-mod AoV sclerosis w/o stenosis.   Anxiety    Arthritis    Chronic back pain    COPD (chronic obstructive pulmonary disease) (HCC)    Coronary artery disease    a. s/p remote PCI x 5;  b. 2006 s/p CABG x 3 ([place] b. 05/2016 MV: attenuation corrected images w/o ischemia or wma-->Med rx; c. 06/2020 MV: EF 55-65%, no isch/infart-->low risk.   Depression    Diabetes mellitus without complication ([place])    Essential hypertension 06/30/2016   GERD (gastroesophageal reflux disease)    Heart attack ([place])    Total of 3 per pt.   Hyperlipidemia LDL goal <55    Hypertensive urgency 06/03/2015   PAD (peripheral artery disease) ([place])    a. 06/2019 Angio: R AT (PTA), R POP (PTA & Viabahn stenting); b. 09/2020 ABIs: R 1.24, L 1.35.   Palpitations    a. 06/2020 [place]: Sinus brady, 54 (40-143), 4 beats NSVT, 3 SVT runs (max 143 bpm x 4 beats).   Seizure [facility])    Past Surgical History:  Procedure Laterality Date   ABDOMINAL HYSTERECTOMY     ANKLE SURGERY Right    CATARACT EXTRACTION W/ INTRAOCULAR LENS  IMPLANT, BILATERAL     COLONOSCOPY WITH PROPOFOL N/A 05/04/2017   Procedure: COLONOSCOPY WITH PROPOFOL;  Surgeon: [name], MD;  Location: [facility] ENDOSCOPY;  Service: Endoscopy;  Laterality: N/A;   CORONARY ANGIOPLASTY      CORONARY ARTERY BYPASS GRAFT     TRIPLE BYPASS   ESOPHAGOGASTRODUODENOSCOPY N/A 02/25/2021   Procedure: ESOPHAGOGASTRODUODENOSCOPY (EGD);  Surgeon: Lin Landsman, MD;  Location: [facility] ENDOSCOPY;  Service: Gastroenterology;  Laterality: N/A;   ESOPHAGOGASTRODUODENOSCOPY (EGD) WITH PROPOFOL N/A 05/04/2017   Procedure: ESOPHAGOGASTRODUODENOSCOPY (EGD) WITH PROPOFOL;  Surgeon: [name], MD;  Location: [facility] ENDOSCOPY;  Service: Endoscopy;  Laterality: N/A;   EXCISION MASS LOWER EXTREMETIES Right 03/02/2018   Procedure: EXCISION SOFT TISSUE MASS FROM MEDIAL ASPECT OF RIGHT ANKLE;  Surgeon: [name], MD;  Location: [facility] ORS;  Service: Orthopedics;  Laterality: Right;   FRACTURE SURGERY     JOINT REPLACEMENT     right   KNEE SURGERY Right    LOWER EXTREMITY ANGIOGRAPHY Right 07/14/2019   Procedure: LOWER EXTREMITY ANGIOGRAPHY;  Surgeon: Algernon Huxley, MD;  Location: [place] CV LAB;  Service: Cardiovascular;  Laterality: Right;   MOUTH SURGERY Left 07/14/2017   TOTAL KNEE ARTHROPLASTY Left 09/27/2019   Procedure: TOTAL KNEE ARTHROPLASTY;  Surgeon: [name], MD;  Location: [facility] ORS;  Service: Orthopedics;  Laterality: Left;     No outpatient medications have been marked as taking for the 02/04/22 encounter (Appointment) with Minna Merritts, MD.     Allergies:   Aspirin   Social History   Tobacco Use   Smoking status: Former    Packs/day: 0.25    Years: 20.00    Total pack years: 5.00    Types: Cigarettes    Quit date: 07/01/2006    Years since quitting: 15.6   Smokeless tobacco: Never  Vaping Use   Vaping Use: Never used  Substance Use Topics   Alcohol use: No    Alcohol/week: 0.0 standard drinks of alcohol   Drug use: No     Family Hx: The patient's family history includes Cancer in her sister; Depression in her mother; Diabetes in her brother, mother, and another family member; Heart attack in her mother and  sister; Heart disease in her mother; Heart  failure in her mother and sister; Hypertension in her brother, mother, sister, and another family member; SIDS in her sister; Stroke in her mother.  ROS:   Please see the history of present illness.    Review of Systems  Constitutional: Negative.   Respiratory: Negative.    Cardiovascular: Negative.   Gastrointestinal: Negative.   Musculoskeletal:  Positive for joint pain.  Neurological: Negative.   Psychiatric/Behavioral: Negative.    All other systems reviewed and are negative.    Labs/Other Tests and Data Reviewed:    Recent Labs: 02/25/2021: Magnesium 1.8 08/09/2021: ALT 15; BUN 13; Creatinine, Ser 0.78; Hemoglobin 8.8; Platelets 178; Potassium 4.4; Sodium 142   Recent Lipid Panel Lab Results  Component Value Date/Time   CHOL 120 01/30/2017 04:04 AM   TRIG 73 01/30/2017 04:04 AM   HDL 47 01/30/2017 04:04 AM   CHOLHDL 2.6 01/30/2017 04:04 AM   LDLCALC 58 01/30/2017 04:04 AM    Wt Readings from Last 3 Encounters:  12/31/21 262 lb (118.8 kg)  12/04/21 260 lb (117.9 kg)  10/24/21 267 lb (121.1 kg)     Exam:    Vital Signs: Vital signs may also be detailed in the HPI There were no vitals taken for this visit.   Constitutional:  oriented to person, place, and time. No distress.  Obese, in a wheelchair HENT:  Head: Grossly normal Eyes:  no discharge. No scleral icterus.  Neck: No JVD, no carotid bruits  Cardiovascular: Regular rate and rhythm, no murmurs appreciated Pulmonary/Chest: Clear to auscultation bilaterally, no wheezes or rails Abdominal: Soft.  no distension.  no tenderness.  Musculoskeletal: Normal range of motion Neurological:  normal muscle tone. Coordination normal. No atrophy Skin: Skin warm and dry Psychiatric: normal affect, pleasant   ASSESSMENT & PLAN:    Chronic diastolic congestive heart failure (HCC) Euvolemic on today's visit, no changes to her medications  Type 2 diabetes mellitus without complication, with long-term current use of  insulin (HCC) Weight trending upwards We have encouraged continued exercise, careful diet management in an effort to lose weight.  Atypical chest discomfort Further discussion with her, moderate size hiatal hernia noted 2019 with a ring requiring dilation Has not had further GI work-up since then but reports her symptoms are back, food hanging up, sticking, spitting up, unable to eat pieces of meat -CT attenuation correction images from stress test shows at least moderate size hiatal hernia if not bigger -At her request referral placed to GI, may need EGD, unclear if she needs dilation for recurrent ring, we did discuss options for hiatal hernia but will defer to GI She is interested in having this corrected given severity of her symptoms  Coronary artery disease of native artery of native heart with stable angina pectoris ([place]) Currently with no symptoms of angina. No further workup at this time. Continue current medication regimen. Recent stress test low risk Non-smoker, cholesterol at goal  Essential hypertension Blood pressure is well controlled on today's visit. No changes made to the medications.  Morbid obesity ([place]) We have encouraged continued exercise, careful diet management in an effort to lose weight.  Centrilobular emphysema ([place]) Prior smoking, stopped years ago On inhalers per primary care   Total encounter time more than 25 minutes  Greater than 50% was spent in counseling and coordination of care with the patient    Signed, Ida Rogue, MD  02/03/2022 1:27 PM    [place]  Office [address], [place], [place] [address]

## 2022-02-04 ENCOUNTER — Ambulatory Visit: Payer: Medicare HMO | Attending: Cardiovascular Disease | Admitting: Cardiovascular Disease

## 2022-02-04 ENCOUNTER — Encounter: Payer: Self-pay | Admitting: Cardiovascular Disease

## 2022-02-04 VITALS — BP 122/78 | HR 65 | Ht 71.0 in | Wt 277.8 lb

## 2022-02-04 DIAGNOSIS — G4733 Obstructive sleep apnea (adult) (pediatric): Secondary | ICD-10-CM

## 2022-02-04 DIAGNOSIS — K449 Diaphragmatic hernia without obstruction or gangrene: Secondary | ICD-10-CM

## 2022-02-04 DIAGNOSIS — I340 Nonrheumatic mitral (valve) insufficiency: Secondary | ICD-10-CM

## 2022-02-04 DIAGNOSIS — E119 Type 2 diabetes mellitus without complications: Secondary | ICD-10-CM

## 2022-02-04 DIAGNOSIS — Z951 Presence of aortocoronary bypass graft: Secondary | ICD-10-CM | POA: Diagnosis not present

## 2022-02-04 DIAGNOSIS — I25119 Atherosclerotic heart disease of native coronary artery with unspecified angina pectoris: Secondary | ICD-10-CM | POA: Diagnosis not present

## 2022-02-04 DIAGNOSIS — I5032 Chronic diastolic (congestive) heart failure: Secondary | ICD-10-CM

## 2022-02-04 DIAGNOSIS — K219 Gastro-esophageal reflux disease without esophagitis: Secondary | ICD-10-CM

## 2022-02-04 DIAGNOSIS — Z794 Long term (current) use of insulin: Secondary | ICD-10-CM

## 2022-02-04 DIAGNOSIS — I10 Essential (primary) hypertension: Secondary | ICD-10-CM

## 2022-02-04 MED ORDER — ATORVASTATIN CALCIUM 40 MG PO TABS: 40.0000 mg | ORAL_TABLET | Freq: Every day | ORAL | 3 refills | Status: AC

## 2022-02-04 MED ORDER — NITROGLYCERIN 0.4 MG SL SUBL: SUBLINGUAL_TABLET | SUBLINGUAL | 0 refills | Status: AC

## 2022-02-04 MED ORDER — CARVEDILOL 3.125 MG PO TABS: 3.1250 mg | ORAL_TABLET | Freq: Every day | ORAL | 3 refills | Status: DC | PRN

## 2022-02-04 NOTE — Patient Instructions (Signed)
Medication Instructions:  No changes  If you need a refill on your cardiac medications before your next appointment, please call your pharmacy.   Lab work: No new labs needed  Testing/Procedures: No new testing needed  Follow-Up: At [facility], you and your health needs are our priority.  As part of our continuing mission to provide you with exceptional heart care, we have created designated Provider Care Teams.  These Care Teams include your primary Cardiologist (physician) and Advanced Practice Providers (APPs -  Physician Assistants and Nurse Practitioners) who all work together to provide you with the care you need, when you need it.  You will need a follow up appointment in 12 months  Providers on your designated Care Team:   Christopher Berge, NP Ryan Dunn, PA-C Cadence Furth, PA-C  COVID-19 Vaccine Information can be found at: [email or web address] For questions related to vaccine distribution or appointments, please email [email or web address] or call [phone].   

## 2022-02-05 ENCOUNTER — Ambulatory Visit (INDEPENDENT_AMBULATORY_CARE_PROVIDER_SITE_OTHER): Payer: Medicare HMO | Admitting: Gastroenterology

## 2022-02-05 ENCOUNTER — Encounter: Payer: Self-pay | Admitting: Gastroenterology

## 2022-02-05 VITALS — BP 182/92 | HR 62 | Temp 98.1°F | Ht 71.0 in | Wt 278.5 lb

## 2022-02-05 DIAGNOSIS — K449 Diaphragmatic hernia without obstruction or gangrene: Secondary | ICD-10-CM | POA: Diagnosis not present

## 2022-02-05 DIAGNOSIS — D500 Iron deficiency anemia secondary to blood loss (chronic): Secondary | ICD-10-CM

## 2022-02-05 MED ORDER — OMEPRAZOLE 40 MG PO CPDR: 40.0000 mg | DELAYED_RELEASE_CAPSULE | Freq: Two times a day (BID) | ORAL | 3 refills | Status: DC

## 2022-02-05 MED ORDER — SUCRALFATE 1 GM/10ML PO SUSP: 1.0000 g | Freq: Four times a day (QID) | ORAL | 3 refills | Status: DC

## 2022-02-05 NOTE — Progress Notes (Signed)
Cephas Darby, MD [address]  [place]  [place], [place] [address]  Main: [phone]  Fax: [phone]    Gastroenterology Consultation  Referring Provider:     Aura Dials, MD Primary Care Physician:  Crissie Figures, PA-C Primary Gastroenterologist:  Dr. Cephas Darby Reason for Consultation: Chronic GERD, chronic iron deficiency anemia        HPI:   Tina Hayes is a 76 y.o. female referred by Dr. Crissie Figures, PA-C  for consultation & management of chronic GERD, chronic iron deficiency anemia secondary to chronic blood loss from presence of large hiatal hernia.  Patient was hospitalized to [facility] in 01/2021 secondary to anemia and melena, EGD revealed 8 cm hiatal hernia with Lysbeth Galas ulcers.  Since then patient has been taking Protonix 40 mg p.o. twice daily.  Patient reports that she does have flareup of symptoms of reflux, regurgitation, epigastric discomfort, trying to eat small portions as well as has nutritional shakes.  She denies sweet tea, carbonated beverages. She does have chronic iron deficiency anemia, takes oral iron 2 pills daily.  Her most recent hemoglobin was 8.8 on 08/09/2021 with normal MCV.  Her last serum ferritin was 9/22  NSAIDs: None  Antiplts/Anticoagulants/Anti thrombotics: Plavix  GI Procedures:  Upper endoscopy 02/25/2021 - Normal duodenal bulb and second portion of the duodenum. - 8 cm hiatal hernia with a few Cameron ulcers. - Erythematous mucosa in the gastric body. Biopsied. - Normal incisura and antrum. Biopsied. - Normal gastroesophageal junction and esophagus. DIAGNOSIS:  A. STOMACH, RANDOM; COLD BIOPSY:  - NONSPECIFIC CHRONIC GASTRITIS, MINIMAL.  - NEGATIVE FOR ACTIVE INFLAMMATION AND H PYLORI.  - NEGATIVE FOR INTESTINAL METAPLASIA, DYSPLASIA, AND MALIGNANCY.    Colonoscopy 05/04/2017 - Diverticulosis in the sigmoid colon. - Internal hemorrhoids. - The examination was otherwise normal. - No specimens  collected.  Past Medical History:  Diagnosis Date   (HFpEF) heart failure with preserved ejection fraction ([place])    a. 05/2016 Echo: EF 60-65%, mild to mod LVH, Gr1 DD, mild MR, mildly dil LA, mod TR, mildly to mod increased PASP; b. 07/2020 Echo: EF 55-60%, no rwma, mild LVH, GrIDD, mildly dil LA, mild MR, mild-mod TR, mild-mod AoV sclerosis w/o stenosis.   Anxiety    Arthritis    Chronic back pain    COPD (chronic obstructive pulmonary disease) (HCC)    Coronary artery disease    a. s/p remote PCI x 5;  b. 2006 s/p CABG x 3 ([place]); b. 05/2016 MV: attenuation corrected images w/o ischemia or wma-->Med rx; c. 06/2020 MV: EF 55-65%, no isch/infart-->low risk.   Depression    Diabetes mellitus without complication ([place])    Essential hypertension 06/30/2016   GERD (gastroesophageal reflux disease)    Heart attack ([place])    Total of 3 per pt.   Hyperlipidemia LDL goal <55    Hypertensive urgency 06/03/2015   PAD (peripheral artery disease) ([place])    a. 06/2019 Angio: R AT (PTA), R POP (PTA & Viabahn stenting); b. 09/2020 ABIs: R 1.24, L 1.35.   Palpitations    a. 06/2020 Zio: Sinus brady, 54 (40-143), 4 beats NSVT, 3 SVT runs (max 143 bpm x 4 beats).   Seizure [facility])     Past Surgical History:  Procedure Laterality Date   ABDOMINAL HYSTERECTOMY     ANKLE SURGERY Right    CATARACT EXTRACTION W/ INTRAOCULAR LENS  IMPLANT, BILATERAL     COLONOSCOPY WITH PROPOFOL  N/A 05/04/2017   Procedure: COLONOSCOPY WITH PROPOFOL;  Surgeon: [name], MD;  Location: [facility] ENDOSCOPY;  Service: Endoscopy;  Laterality: N/A;   CORONARY ANGIOPLASTY     CORONARY ARTERY BYPASS GRAFT     TRIPLE BYPASS   ESOPHAGOGASTRODUODENOSCOPY N/A 02/25/2021   Procedure: ESOPHAGOGASTRODUODENOSCOPY (EGD);  Surgeon: Lin Landsman, MD;  Location: [facility] ENDOSCOPY;  Service: Gastroenterology;  Laterality: N/A;   ESOPHAGOGASTRODUODENOSCOPY (EGD) WITH PROPOFOL N/A 05/04/2017   Procedure:  ESOPHAGOGASTRODUODENOSCOPY (EGD) WITH PROPOFOL;  Surgeon: [name], MD;  Location: [facility] ENDOSCOPY;  Service: Endoscopy;  Laterality: N/A;   EXCISION MASS LOWER EXTREMETIES Right 03/02/2018   Procedure: EXCISION SOFT TISSUE MASS FROM MEDIAL ASPECT OF RIGHT ANKLE;  Surgeon: [name], MD;  Location: [facility] ORS;  Service: Orthopedics;  Laterality: Right;   FRACTURE SURGERY     JOINT REPLACEMENT     right   KNEE SURGERY Right    LOWER EXTREMITY ANGIOGRAPHY Right 07/14/2019   Procedure: LOWER EXTREMITY ANGIOGRAPHY;  Surgeon: Algernon Huxley, MD;  Location: [place] CV LAB;  Service: Cardiovascular;  Laterality: Right;   MOUTH SURGERY Left 07/14/2017   TOTAL KNEE ARTHROPLASTY Left 09/27/2019   Procedure: TOTAL KNEE ARTHROPLASTY;  Surgeon: [name], MD;  Location: [facility] ORS;  Service: Orthopedics;  Laterality: Left;     Current Outpatient Medications:    acetaminophen (TYLENOL) 500 MG tablet, Take 500 mg by mouth every 6 (six) hours as needed for headache, moderate pain or fever., Disp: , Rfl:    albuterol (PROVENTIL HFA;VENTOLIN HFA) 108 (90 Base) MCG/ACT inhaler, Inhale 2 puffs into the lungs every 6 (six) hours as needed for wheezing or shortness of breath., Disp: 1 Inhaler, Rfl: 2   albuterol (PROVENTIL) (2.5 MG/3ML) 0.083% nebulizer solution, Take 2.5 mg every 4 (four) hours as needed by nebulization for wheezing or shortness of breath., Disp: , Rfl:    amitriptyline (ELAVIL) 25 MG tablet, Take 25 mg by mouth at bedtime., Disp: , Rfl:    amLODipine (NORVASC) 5 MG tablet, Take 1 tablet (5 mg total) by mouth daily., Disp: 90 tablet, Rfl: 3   atorvastatin (LIPITOR) 40 MG tablet, Take 1 tablet (40 mg total) by mouth daily., Disp: 90 tablet, Rfl: 3   B-D UF III MINI PEN NEEDLES 31G X 5 MM MISC, USE WITH INSULIN PEN INJECTIONS TWICE DAILY, Disp: , Rfl:    carvedilol (COREG) 3.125 MG tablet, Take 1 tablet (3.125 mg total) by mouth daily as needed. Please call to schedule an overdue  appointment with Cardiology for refills, [phone], thank you. 1st attempt., Disp: 180 tablet, Rfl: 3   clopidogrel (PLAVIX) 75 MG tablet, Take 1 tablet (75 mg total) by mouth daily., Disp: 90 tablet, Rfl: 3   DUPIXENT 300 MG/2ML SOPN, Inject into the skin., Disp: , Rfl:    [START ON 03/04/2022] HYDROcodone-acetaminophen (NORCO) 7.5-325 MG tablet, Take 1 tablet by mouth every 6 (six) hours as needed for moderate pain or severe pain., Disp: 120 tablet, Rfl: 0   isosorbide mononitrate (IMDUR) 30 MG 24 hr tablet, Take 1 tablet (30 mg total) by mouth daily., Disp: 180 tablet, Rfl: 3   LANTUS SOLOSTAR 100 UNIT/ML Solostar Pen, Inject 30-40 Units into the skin See admin instructions. Inject 30 units subcutaneously in the morning at 40 units subcutaneously at bedtime., Disp: , Rfl:    levETIRAcetam (KEPPRA) 750 MG tablet, Take 750 mg by mouth 2 (two) times daily., Disp: , Rfl:    losartan (COZAAR) 100 MG tablet,  Take 1 tablet (100 mg total) by mouth daily., Disp: 90 tablet, Rfl: 3   methocarbamol (ROBAXIN) 750 MG tablet, TAKE 1 TABLET BY MOUTH EVERY 8 HOURS AS NEEDED FOR MUSCLE SPASM, Disp: 90 tablet, Rfl: 0   montelukast (SINGULAIR) 10 MG tablet, Take 10 mg by mouth daily., Disp: , Rfl:    nitroGLYCERIN (NITROSTAT) 0.4 MG SL tablet, DISSOLVE ONE TABLET UNDER THE TONGUE EVERY 5 MINUTES AS NEEDED FOR CHEST PAIN.  DO NOT EXCEED A TOTAL OF 3 DOSES IN 15 MINUTES, Disp: 25 tablet, Rfl: 0   omeprazole (PRILOSEC) 40 MG capsule, Take 1 capsule (40 mg total) by mouth 2 (two) times daily before a meal., Disp: 180 capsule, Rfl: 3   ondansetron (ZOFRAN) 4 MG tablet, Take 4 mg by mouth as needed., Disp: , Rfl:    potassium chloride SA (KLOR-CON) 20 MEQ tablet, Take 2 tablets (40 mEq total) by mouth 2 (two) times daily. Take extra 2 tablets (40 meq) if you take metolazone, Disp: 140 tablet, Rfl: 5   sertraline (ZOLOFT) 100 MG tablet, Take 150 mg by mouth daily., Disp: , Rfl:    sucralfate (CARAFATE) 1 GM/10ML  suspension, Take 10 mLs (1 g total) by mouth 4 (four) times daily., Disp: 1200 mL, Rfl: 3   tiotropium (SPIRIVA) 18 MCG inhalation capsule, Place 18 mcg into inhaler and inhale daily., Disp: , Rfl:    torsemide (DEMADEX) 20 MG tablet, Take 1 tablet (20 mg) by mouth twice daily, Disp: , Rfl:    traZODone (DESYREL) 50 MG tablet, Take 50 mg by mouth at bedtime., Disp: , Rfl:    Family History  Problem Relation Age of Onset   Diabetes Other    Hypertension Other    Diabetes Mother    Heart failure Mother    Heart disease Mother    Heart attack Mother    Stroke Mother    Depression Mother    Hypertension Mother    Cancer Sister        brain   Hypertension Sister    Diabetes Brother    Hypertension Brother    Heart failure Sister    Heart attack Sister    SIDS Sister      Social History   Tobacco Use   Smoking status: Former    Packs/day: 0.25    Years: 20.00    Total pack years: 5.00    Types: Cigarettes    Quit date: 07/01/2006    Years since quitting: 15.6   Smokeless tobacco: Never  Vaping Use   Vaping Use: Never used  Substance Use Topics   Alcohol use: No    Alcohol/week: 0.0 standard drinks of alcohol   Drug use: No    Allergies as of 02/05/2022 - Review Complete 02/05/2022  Allergen Reaction Noted   Aspirin Anaphylaxis, Shortness Of Breath, and Swelling 04/14/2015    Review of Systems:    All systems reviewed and negative except where noted in HPI.   Physical Exam:  BP (!) 182/92 (BP Location: Left Arm, Patient Position: Sitting, Cuff Size: Normal)   Pulse 62   Temp 98.1 F (36.7 C) (Oral)   Ht '5\' 11"'$  (1.803 m)   Wt 278 lb 8 oz (126.3 kg)   BMI 38.84 kg/m  No LMP recorded. Patient has had a hysterectomy.  General:   Alert,  Well-developed, well-nourished, pleasant and cooperative in NAD Head:  Normocephalic and atraumatic. Eyes:  Sclera clear, no icterus.   Conjunctiva pink. Ears:  Normal  auditory acuity. Nose:  No deformity, discharge, or  lesions. Mouth:  No deformity or lesions,oropharynx pink & moist. Neck:  Supple; no masses or thyromegaly. Lungs:  Respirations even and unlabored.  Clear throughout to auscultation.   No wheezes, crackles, or rhonchi. No acute distress. Heart:  Regular rate and rhythm; no murmurs, clicks, rubs, or gallops. Abdomen:  Normal bowel sounds. Soft, morbidly obese, non-tender and non-distended without masses, hepatosplenomegaly or hernias noted.  No guarding or rebound tenderness.   Rectal: Not performed Msk:  Symmetrical without gross deformities. Good, equal movement & strength bilaterally. Pulses:  Normal pulses noted. Extremities:  No clubbing or edema.  No cyanosis. Neurologic:  Alert and oriented x3;  grossly normal neurologically. Skin:  Intact without significant lesions or rashes. No jaundice. Psych:  Alert and cooperative. Normal mood and affect.  Imaging Studies: Reviewed  Assessment and Plan:   GYSELLE MATTHEW is a 76 y.o. female with obesity, metabolic syndrome, coronary artery disease s/p CABG on Plavix is seen in consultation for chronic GERD, large hiatal hernia, chronic iron deficiency anemia  Chronic GERD with large hiatal hernia Patient will likely be high risk to undergo surgery for hiatal hernia given her overall medical condition Switch from Protonix to omeprazole 40 mg p.o. twice daily before meals Trial of Carafate suspension 3-4 times daily as needed Advised her to continue small portion meals and remain upright for at least an hour after each meal  Chronic iron deficiency anemia Patient denies any melena, rectal bleeding Continue oral iron Recheck CBC, iron panel, B12 and folate levels Patient is at risk for chronic blood loss with presence of large hiatal hernia Colonoscopy in 2019 was unremarkable I do not recommend any endoscopic procedures at this time   Follow up in 4 to 6 months   Cephas Darby, MD

## 2022-02-06 ENCOUNTER — Telehealth: Payer: Self-pay

## 2022-02-06 LAB — IRON,TIBC AND FERRITIN PANEL
Ferritin: 15 ng/mL (ref 15–150)
Iron Saturation: 9 % — CL (ref 15–55)
Iron: 35 ug/dL (ref 27–139)
Total Iron Binding Capacity: 380 ug/dL (ref 250–450)
UIBC: 345 ug/dL (ref 118–369)

## 2022-02-06 LAB — CBC
Hematocrit: 31 % — ABNORMAL LOW (ref 34.0–46.6)
Hemoglobin: 8.8 g/dL — ABNORMAL LOW (ref 11.1–15.9)
MCH: 22.9 pg — ABNORMAL LOW (ref 26.6–33.0)
MCHC: 28.4 g/dL — ABNORMAL LOW (ref 31.5–35.7)
MCV: 81 fL (ref 79–97)
Platelets: 345 10*3/uL (ref 150–450)
RBC: 3.85 x10E6/uL (ref 3.77–5.28)
RDW: 14.4 % (ref 11.7–15.4)
WBC: 4.6 10*3/uL (ref 3.4–10.8)

## 2022-02-06 LAB — B12 AND FOLATE PANEL
Folate: 15.2 ng/mL (ref >3.0)
Vitamin B-12: 711 pg/mL (ref 232–1245)

## 2022-02-06 NOTE — Telephone Encounter (Signed)
Changed referral to urgent

## 2022-02-06 NOTE — Telephone Encounter (Signed)
-----   Message from Lin Landsman, MD sent at 02/06/2022  1:06 PM EDT ----- Patient with severe iron deficiency anemia.  She is on oral iron supplements currently.  Please place urgent referral to oncology for parenteral iron therapy  RV

## 2022-02-09 ENCOUNTER — Other Ambulatory Visit: Payer: Self-pay | Admitting: Cardiovascular Disease

## 2022-02-09 ENCOUNTER — Other Ambulatory Visit: Payer: Self-pay | Admitting: Anesthesiology

## 2022-02-13 ENCOUNTER — Inpatient Hospital Stay: Payer: Medicare HMO | Attending: Internal Medicine | Admitting: Internal Medicine

## 2022-02-13 ENCOUNTER — Inpatient Hospital Stay: Payer: Medicare HMO

## 2022-02-15 ENCOUNTER — Other Ambulatory Visit: Payer: Self-pay | Admitting: Anesthesiology

## 2022-03-04 ENCOUNTER — Telehealth: Payer: Medicare HMO | Admitting: Anesthesiology

## 2022-03-19 ENCOUNTER — Encounter: Payer: Self-pay | Admitting: Anesthesiology

## 2022-03-19 ENCOUNTER — Ambulatory Visit: Payer: Medicare HMO | Attending: Anesthesiology | Admitting: Anesthesiology

## 2022-03-19 DIAGNOSIS — M5431 Sciatica, right side: Secondary | ICD-10-CM

## 2022-03-19 DIAGNOSIS — M47817 Spondylosis without myelopathy or radiculopathy, lumbosacral region: Secondary | ICD-10-CM

## 2022-03-19 DIAGNOSIS — G4486 Cervicogenic headache: Secondary | ICD-10-CM

## 2022-03-19 DIAGNOSIS — M4716 Other spondylosis with myelopathy, lumbar region: Secondary | ICD-10-CM

## 2022-03-19 DIAGNOSIS — M5432 Sciatica, left side: Secondary | ICD-10-CM | POA: Diagnosis not present

## 2022-03-19 DIAGNOSIS — M5136 Other intervertebral disc degeneration, lumbar region: Secondary | ICD-10-CM

## 2022-03-19 DIAGNOSIS — G894 Chronic pain syndrome: Secondary | ICD-10-CM

## 2022-03-19 DIAGNOSIS — M25512 Pain in left shoulder: Secondary | ICD-10-CM

## 2022-03-19 DIAGNOSIS — M48062 Spinal stenosis, lumbar region with neurogenic claudication: Secondary | ICD-10-CM

## 2022-03-19 DIAGNOSIS — M170 Bilateral primary osteoarthritis of knee: Secondary | ICD-10-CM

## 2022-03-19 DIAGNOSIS — F1190 Opioid use, unspecified, uncomplicated: Secondary | ICD-10-CM

## 2022-03-19 MED ORDER — HYDROCODONE-ACETAMINOPHEN 7.5-325 MG PO TABS: 1.0000 | ORAL_TABLET | Freq: Four times a day (QID) | ORAL | 0 refills | Status: AC | PRN

## 2022-03-19 MED ORDER — HYDROCODONE-ACETAMINOPHEN 7.5-325 MG PO TABS: 1.0000 | ORAL_TABLET | Freq: Four times a day (QID) | ORAL | 0 refills | Status: DC | PRN

## 2022-03-19 NOTE — Progress Notes (Signed)
Virtual Visit via Telephone Note  I connected with Tina Hayes on 03/19/22 at  2:40 PM EST by telephone and verified that I am speaking with the correct person using two identifiers.  Location: Patient: Home Provider: Pain control center   I discussed the limitations, risks, security and privacy concerns of performing an evaluation and management service by telephone and the availability of in person appointments. I also discussed with the patient that there may be a patient responsible charge related to this service. The patient expressed understanding and agreed to proceed.   History of Present Illness: I spoke with Tina Hayes via telephone as we are unable link for the video portion of the conference.  She reports that she is doing quite well following her most recent epidural back in September.  She still has some back pain but the sciatica symptoms have effectively resolved.  She rates her low back pain as a pain score of 6 or 7 and when she takes her hydrocodone it drops down to 3 or 4 and enables her to be functionally active and sleep better at night.  She is able to manage effectively with the medication where she fails without as reported today.  No change in the quality characteristic or distribution of the pain is noted.  She is trying to stay active as best possible.  No change in strength or bowel or bladder function is noted.  Review of systems: General: No fevers or chills Pulmonary: No shortness of breath or dyspnea Cardiac: No angina or palpitations or lightheadedness GI: No abdominal pain or constipation Psych: No depression    Observations/Objective:  Current Outpatient Medications:    [START ON 05/03/2022] HYDROcodone-acetaminophen (NORCO) 7.5-325 MG tablet, Take 1 tablet by mouth every 6 (six) hours as needed for moderate pain or severe pain., Disp: 120 tablet, Rfl: 0   acetaminophen (TYLENOL) 500 MG tablet, Take 500 mg by mouth every 6 (six) hours as needed for  headache, moderate pain or fever., Disp: , Rfl:    albuterol (PROVENTIL HFA;VENTOLIN HFA) 108 (90 Base) MCG/ACT inhaler, Inhale 2 puffs into the lungs every 6 (six) hours as needed for wheezing or shortness of breath., Disp: 1 Inhaler, Rfl: 2   albuterol (PROVENTIL) (2.5 MG/3ML) 0.083% nebulizer solution, Take 2.5 mg every 4 (four) hours as needed by nebulization for wheezing or shortness of breath., Disp: , Rfl:    amitriptyline (ELAVIL) 25 MG tablet, Take 25 mg by mouth at bedtime., Disp: , Rfl:    amLODipine (NORVASC) 5 MG tablet, Take 1 tablet (5 mg total) by mouth daily., Disp: 90 tablet, Rfl: 3   atorvastatin (LIPITOR) 40 MG tablet, Take 1 tablet (40 mg total) by mouth daily., Disp: 90 tablet, Rfl: 3   B-D UF III MINI PEN NEEDLES 31G X 5 MM MISC, USE WITH INSULIN PEN INJECTIONS TWICE DAILY, Disp: , Rfl:    carvedilol (COREG) 3.125 MG tablet, Take 1 tablet (3.125 mg total) by mouth daily as needed. Please call to schedule an overdue appointment with Cardiology for refills, [phone], thank you. 1st attempt., Disp: 180 tablet, Rfl: 3   clopidogrel (PLAVIX) 75 MG tablet, Take 1 tablet (75 mg total) by mouth daily., Disp: 90 tablet, Rfl: 3   DUPIXENT 300 MG/2ML SOPN, Inject into the skin., Disp: , Rfl:    [START ON 04/03/2022] HYDROcodone-acetaminophen (NORCO) 7.5-325 MG tablet, Take 1 tablet by mouth every 6 (six) hours as needed for moderate pain or severe pain., Disp: 120 tablet, Rfl: 0  isosorbide mononitrate (IMDUR) 30 MG 24 hr tablet, Take 1 tablet (30 mg total) by mouth daily., Disp: 180 tablet, Rfl: 3   LANTUS SOLOSTAR 100 UNIT/ML Solostar Pen, Inject 30-40 Units into the skin See admin instructions. Inject 30 units subcutaneously in the morning at 40 units subcutaneously at bedtime., Disp: , Rfl:    levETIRAcetam (KEPPRA) 750 MG tablet, Take 750 mg by mouth 2 (two) times daily., Disp: , Rfl:    losartan (COZAAR) 100 MG tablet, Take 1 tablet by mouth once daily, Disp: 90 tablet, Rfl: 3    methocarbamol (ROBAXIN) 750 MG tablet, TAKE 1 TABLET BY MOUTH EVERY 8 HOURS AS NEEDED FOR MUSCLE SPASM, Disp: 90 tablet, Rfl: 0   montelukast (SINGULAIR) 10 MG tablet, Take 10 mg by mouth daily., Disp: , Rfl:    nitroGLYCERIN (NITROSTAT) 0.4 MG SL tablet, DISSOLVE ONE TABLET UNDER THE TONGUE EVERY 5 MINUTES AS NEEDED FOR CHEST PAIN.  DO NOT EXCEED A TOTAL OF 3 DOSES IN 15 MINUTES, Disp: 25 tablet, Rfl: 0   omeprazole (PRILOSEC) 40 MG capsule, Take 1 capsule (40 mg total) by mouth 2 (two) times daily before a meal., Disp: 180 capsule, Rfl: 3   ondansetron (ZOFRAN) 4 MG tablet, Take 4 mg by mouth as needed., Disp: , Rfl:    potassium chloride SA (KLOR-CON) 20 MEQ tablet, Take 2 tablets (40 mEq total) by mouth 2 (two) times daily. Take extra 2 tablets (40 meq) if you take metolazone, Disp: 140 tablet, Rfl: 5   sertraline (ZOLOFT) 100 MG tablet, Take 150 mg by mouth daily., Disp: , Rfl:    sucralfate (CARAFATE) 1 GM/10ML suspension, Take 10 mLs (1 g total) by mouth 4 (four) times daily., Disp: 1200 mL, Rfl: 3   tiotropium (SPIRIVA) 18 MCG inhalation capsule, Place 18 mcg into inhaler and inhale daily., Disp: , Rfl:    torsemide (DEMADEX) 20 MG tablet, Take 1 tablet (20 mg) by mouth twice daily, Disp: , Rfl:    traZODone (DESYREL) 50 MG tablet, Take 50 mg by mouth at bedtime., Disp: , Rfl:    Past Medical History:  Diagnosis Date   (HFpEF) heart failure with preserved ejection fraction ([place])    a. 05/2016 Echo: EF 60-65%, mild to mod LVH, Gr1 DD, mild MR, mildly dil LA, mod TR, mildly to mod increased PASP; b. 07/2020 Echo: EF 55-60%, no rwma, mild LVH, GrIDD, mildly dil LA, mild MR, mild-mod TR, mild-mod AoV sclerosis w/o stenosis.   Anxiety    Arthritis    Chronic back pain    COPD (chronic obstructive pulmonary disease) (HCC)    Coronary artery disease    a. s/p remote PCI x 5;  b. 2006 s/p CABG x 3 ([place], [place]); b. 05/2016 MV: attenuation corrected images w/o ischemia  or wma-->Med rx; c. 06/2020 MV: EF 55-65%, no isch/infart-->low risk.   Depression    Diabetes mellitus without complication ([place])    Essential hypertension 06/30/2016   GERD (gastroesophageal reflux disease)    Heart attack ([place])    Total of 3 per pt.   Hyperlipidemia LDL goal <55    Hypertensive urgency 06/03/2015   PAD (peripheral artery disease) ([place])    a. 06/2019 Angio: R AT (PTA), R POP (PTA & Viabahn stenting); b. 09/2020 ABIs: R 1.24, L 1.35.   Palpitations    a. 06/2020 Zio: Sinus brady, 54 (40-143), 4 beats NSVT, 3 SVT runs (max 143 bpm x 4 beats).   Seizure ([place])  Assessment and Plan: 1. Bilateral sciatica   2. Lumbar spondylosis with myelopathy   3. DDD (degenerative disc disease), lumbar   4. Spinal stenosis of lumbar region with neurogenic claudication   5. Chronic, continuous use of opioids   6. Chronic pain syndrome   7. Facet arthritis of lumbosacral region   8. Morbid obesity (HCC)   9. Cervicogenic headache   10. Primary osteoarthritis of both knees   11. Pain in joint of left shoulder   Based on our discussion today I think is appropriate to refill her medicines for the next 2 months.  These be dated for December 7 and January 6 with return to clinic scheduled in 2 months.  Continue efforts at weight loss and daily stretching strengthening in addition to walking for aerobic conditioning and efforts at weight loss.  Continue follow-up with her primary care physicians for baseline medical care.  Follow Up Instructions:    I discussed the assessment and treatment plan with the patient. The patient was provided an opportunity to ask questions and all were answered. The patient agreed with the plan and demonstrated an understanding of the instructions.   The patient was advised to call back or seek an in-person evaluation if the symptoms worsen or if the condition fails to improve as anticipated.  I provided 25 minutes of non-face-to-face time during this  encounter.   Molli Barrows, MD

## 2022-04-10 ENCOUNTER — Emergency Department: Payer: Medicare HMO

## 2022-04-10 ENCOUNTER — Other Ambulatory Visit: Payer: Self-pay

## 2022-04-10 ENCOUNTER — Inpatient Hospital Stay
Admission: EM | Admit: 2022-04-10 | Discharge: 2022-04-12 | DRG: 812 | Disposition: A | Discharge status: Home / Self Care | Payer: Medicare HMO | Attending: Internal Medicine | Admitting: Internal Medicine

## 2022-04-10 DIAGNOSIS — G40909 Epilepsy, unspecified, not intractable, without status epilepticus: Secondary | ICD-10-CM | POA: Diagnosis present

## 2022-04-10 DIAGNOSIS — E119 Type 2 diabetes mellitus without complications: Secondary | ICD-10-CM

## 2022-04-10 DIAGNOSIS — I110 Hypertensive heart disease with heart failure: Secondary | ICD-10-CM | POA: Diagnosis not present

## 2022-04-10 DIAGNOSIS — I2510 Atherosclerotic heart disease of native coronary artery without angina pectoris: Secondary | ICD-10-CM | POA: Diagnosis not present

## 2022-04-10 DIAGNOSIS — Z833 Family history of diabetes mellitus: Secondary | ICD-10-CM | POA: Diagnosis not present

## 2022-04-10 DIAGNOSIS — I10 Essential (primary) hypertension: Secondary | ICD-10-CM | POA: Diagnosis not present

## 2022-04-10 DIAGNOSIS — Z96652 Presence of left artificial knee joint: Secondary | ICD-10-CM | POA: Diagnosis present

## 2022-04-10 DIAGNOSIS — J449 Chronic obstructive pulmonary disease, unspecified: Secondary | ICD-10-CM | POA: Diagnosis present

## 2022-04-10 DIAGNOSIS — Z808 Family history of malignant neoplasm of other organs or systems: Secondary | ICD-10-CM

## 2022-04-10 DIAGNOSIS — Z66 Do not resuscitate: Secondary | ICD-10-CM | POA: Diagnosis not present

## 2022-04-10 DIAGNOSIS — Z1152 Encounter for screening for COVID-19: Secondary | ICD-10-CM | POA: Diagnosis not present

## 2022-04-10 DIAGNOSIS — I252 Old myocardial infarction: Secondary | ICD-10-CM

## 2022-04-10 DIAGNOSIS — R7989 Other specified abnormal findings of blood chemistry: Secondary | ICD-10-CM | POA: Diagnosis present

## 2022-04-10 DIAGNOSIS — M549 Dorsalgia, unspecified: Secondary | ICD-10-CM | POA: Diagnosis present

## 2022-04-10 DIAGNOSIS — Z87891 Personal history of nicotine dependence: Secondary | ICD-10-CM | POA: Diagnosis not present

## 2022-04-10 DIAGNOSIS — Z7902 Long term (current) use of antithrombotics/antiplatelets: Secondary | ICD-10-CM

## 2022-04-10 DIAGNOSIS — R531 Weakness: Secondary | ICD-10-CM | POA: Diagnosis present

## 2022-04-10 DIAGNOSIS — Z8249 Family history of ischemic heart disease and other diseases of the circulatory system: Secondary | ICD-10-CM | POA: Diagnosis not present

## 2022-04-10 DIAGNOSIS — Z823 Family history of stroke: Secondary | ICD-10-CM | POA: Diagnosis not present

## 2022-04-10 DIAGNOSIS — Z951 Presence of aortocoronary bypass graft: Secondary | ICD-10-CM

## 2022-04-10 DIAGNOSIS — E1151 Type 2 diabetes mellitus with diabetic peripheral angiopathy without gangrene: Secondary | ICD-10-CM | POA: Diagnosis present

## 2022-04-10 DIAGNOSIS — F32A Depression, unspecified: Secondary | ICD-10-CM | POA: Diagnosis present

## 2022-04-10 DIAGNOSIS — E1165 Type 2 diabetes mellitus with hyperglycemia: Secondary | ICD-10-CM | POA: Diagnosis present

## 2022-04-10 DIAGNOSIS — I5032 Chronic diastolic (congestive) heart failure: Secondary | ICD-10-CM | POA: Diagnosis present

## 2022-04-10 DIAGNOSIS — Z886 Allergy status to analgesic agent status: Secondary | ICD-10-CM

## 2022-04-10 DIAGNOSIS — D649 Anemia, unspecified: Secondary | ICD-10-CM | POA: Diagnosis not present

## 2022-04-10 DIAGNOSIS — N390 Urinary tract infection, site not specified: Secondary | ICD-10-CM | POA: Diagnosis not present

## 2022-04-10 DIAGNOSIS — E785 Hyperlipidemia, unspecified: Secondary | ICD-10-CM | POA: Diagnosis present

## 2022-04-10 DIAGNOSIS — G8929 Other chronic pain: Secondary | ICD-10-CM | POA: Diagnosis present

## 2022-04-10 DIAGNOSIS — Z818 Family history of other mental and behavioral disorders: Secondary | ICD-10-CM | POA: Diagnosis not present

## 2022-04-10 DIAGNOSIS — R079 Chest pain, unspecified: Secondary | ICD-10-CM

## 2022-04-10 DIAGNOSIS — K219 Gastro-esophageal reflux disease without esophagitis: Secondary | ICD-10-CM | POA: Diagnosis present

## 2022-04-10 DIAGNOSIS — M1990 Unspecified osteoarthritis, unspecified site: Secondary | ICD-10-CM | POA: Diagnosis present

## 2022-04-10 DIAGNOSIS — R0789 Other chest pain: Secondary | ICD-10-CM | POA: Diagnosis not present

## 2022-04-10 DIAGNOSIS — F419 Anxiety disorder, unspecified: Secondary | ICD-10-CM | POA: Diagnosis present

## 2022-04-10 DIAGNOSIS — Z794 Long term (current) use of insulin: Secondary | ICD-10-CM

## 2022-04-10 DIAGNOSIS — Z79899 Other long term (current) drug therapy: Secondary | ICD-10-CM

## 2022-04-10 LAB — URINALYSIS, COMPLETE (UACMP) WITH MICROSCOPIC
Bilirubin Urine: NEGATIVE
Glucose, UA: NEGATIVE mg/dL
Hgb urine dipstick: NEGATIVE
Ketones, ur: NEGATIVE mg/dL
Nitrite: POSITIVE — AB
Protein, ur: 100 mg/dL — AB
Specific Gravity, Urine: 1.01 (ref 1.005–1.030)
WBC, UA: >50 WBC/hpf — ABNORMAL HIGH (ref 0–5)
pH: 7 (ref 5.0–8.0)

## 2022-04-10 LAB — BASIC METABOLIC PANEL
Anion gap: 4 — ABNORMAL LOW (ref 5–15)
BUN: 13 mg/dL (ref 8–23)
CO2: 23 mmol/L (ref 22–32)
Calcium: 8.7 mg/dL — ABNORMAL LOW (ref 8.9–10.3)
Chloride: 112 mmol/L — ABNORMAL HIGH (ref 98–111)
Creatinine, Ser: 0.78 mg/dL (ref 0.44–1.00)
GFR, Estimated: >60 mL/min (ref >60)
Glucose, Bld: 136 mg/dL — ABNORMAL HIGH (ref 70–99)
Potassium: 3.8 mmol/L (ref 3.5–5.1)
Sodium: 139 mmol/L (ref 135–145)

## 2022-04-10 LAB — CBC
HCT: 27.4 % — ABNORMAL LOW (ref 36.0–46.0)
Hemoglobin: 7.7 g/dL — ABNORMAL LOW (ref 12.0–15.0)
MCH: 21.3 pg — ABNORMAL LOW (ref 26.0–34.0)
MCHC: 28.1 g/dL — ABNORMAL LOW (ref 30.0–36.0)
MCV: 75.7 fL — ABNORMAL LOW (ref 80.0–100.0)
Platelets: 306 10*3/uL (ref 150–400)
RBC: 3.62 MIL/uL — ABNORMAL LOW (ref 3.87–5.11)
RDW: 16.4 % — ABNORMAL HIGH (ref 11.5–15.5)
WBC: 5.7 10*3/uL (ref 4.0–10.5)
nRBC: 0 % (ref 0.0–0.2)

## 2022-04-10 LAB — TROPONIN I (HIGH SENSITIVITY)
Troponin I (High Sensitivity): 7 ng/L (ref <18)
Troponin I (High Sensitivity): 4 ng/L (ref <18)
Troponin I (High Sensitivity): 6 ng/L (ref <18)

## 2022-04-10 LAB — RESP PANEL BY RT-PCR (RSV, FLU A&B, COVID)  RVPGX2
Influenza A by PCR: NEGATIVE
Influenza B by PCR: NEGATIVE
Resp Syncytial Virus by PCR: NEGATIVE
SARS Coronavirus 2 by RT PCR: NEGATIVE

## 2022-04-10 MED ORDER — ONDANSETRON HCL 4 MG/2ML IJ SOLN: 4.0000 mg | Freq: Four times a day (QID) | INTRAMUSCULAR | Status: DC | PRN

## 2022-04-10 MED ORDER — LOSARTAN POTASSIUM 50 MG PO TABS
100.0000 mg | ORAL_TABLET | Freq: Every day | ORAL | Status: DC
  Administered 2022-04-11 – 2022-04-12 (×2): 100 mg via ORAL
  Filled 2022-04-10 (×2): qty 2

## 2022-04-10 MED ORDER — ACETAMINOPHEN 650 MG RE SUPP: 650.0000 mg | Freq: Four times a day (QID) | RECTAL | Status: DC | PRN

## 2022-04-10 MED ORDER — SUCRALFATE 1 GM/10ML PO SUSP
1.0000 g | Freq: Four times a day (QID) | ORAL | Status: DC
  Administered 2022-04-10 – 2022-04-12 (×8): 1 g via ORAL
  Filled 2022-04-10 (×8): qty 10

## 2022-04-10 MED ORDER — MONTELUKAST SODIUM 10 MG PO TABS
10.0000 mg | ORAL_TABLET | Freq: Every day | ORAL | Status: DC
  Administered 2022-04-11 – 2022-04-12 (×2): 10 mg via ORAL
  Filled 2022-04-10 (×2): qty 1

## 2022-04-10 MED ORDER — INSULIN GLARGINE-YFGN 100 UNIT/ML ~~LOC~~ SOLN
30.0000 [IU] | Freq: Every day | SUBCUTANEOUS | Status: DC
  Administered 2022-04-11 – 2022-04-12 (×2): 30 [IU] via SUBCUTANEOUS
  Filled 2022-04-10 (×2): qty 0.3

## 2022-04-10 MED ORDER — ENOXAPARIN SODIUM 80 MG/0.8ML IJ SOSY
0.5000 mg/kg | PREFILLED_SYRINGE | INTRAMUSCULAR | Status: DC
  Administered 2022-04-11 – 2022-04-12 (×2): 62.5 mg via SUBCUTANEOUS
  Filled 2022-04-10 (×3): qty 0.63

## 2022-04-10 MED ORDER — SODIUM CHLORIDE 0.9 % IV SOLN
1.0000 g | INTRAVENOUS | Status: DC
  Administered 2022-04-11 – 2022-04-12 (×2): 1 g via INTRAVENOUS
  Filled 2022-04-10: qty 10
  Filled 2022-04-10: qty 1

## 2022-04-10 MED ORDER — TIOTROPIUM BROMIDE MONOHYDRATE 18 MCG IN CAPS
18.0000 ug | ORAL_CAPSULE | Freq: Every day | RESPIRATORY_TRACT | Status: DC
  Administered 2022-04-11 – 2022-04-12 (×2): 18 ug via RESPIRATORY_TRACT
  Filled 2022-04-10: qty 5

## 2022-04-10 MED ORDER — LEVETIRACETAM 750 MG PO TABS
750.0000 mg | ORAL_TABLET | Freq: Two times a day (BID) | ORAL | Status: DC
  Administered 2022-04-10 – 2022-04-12 (×4): 750 mg via ORAL
  Filled 2022-04-10 (×5): qty 1

## 2022-04-10 MED ORDER — ACETAMINOPHEN 325 MG PO TABS: 650.0000 mg | ORAL_TABLET | Freq: Four times a day (QID) | ORAL | Status: DC | PRN

## 2022-04-10 MED ORDER — CLOPIDOGREL BISULFATE 75 MG PO TABS
75.0000 mg | ORAL_TABLET | Freq: Every day | ORAL | Status: DC
  Administered 2022-04-11 – 2022-04-12 (×2): 75 mg via ORAL
  Filled 2022-04-10 (×2): qty 1

## 2022-04-10 MED ORDER — AMLODIPINE BESYLATE 5 MG PO TABS
5.0000 mg | ORAL_TABLET | Freq: Every day | ORAL | Status: DC
  Administered 2022-04-11 – 2022-04-12 (×2): 5 mg via ORAL
  Filled 2022-04-10 (×2): qty 1

## 2022-04-10 MED ORDER — ALBUTEROL SULFATE (2.5 MG/3ML) 0.083% IN NEBU: 2.5000 mg | INHALATION_SOLUTION | RESPIRATORY_TRACT | Status: DC | PRN

## 2022-04-10 MED ORDER — ATORVASTATIN CALCIUM 20 MG PO TABS
40.0000 mg | ORAL_TABLET | Freq: Every day | ORAL | Status: DC
  Administered 2022-04-11 – 2022-04-12 (×2): 40 mg via ORAL
  Filled 2022-04-10 (×2): qty 2

## 2022-04-10 MED ORDER — ISOSORBIDE MONONITRATE ER 30 MG PO TB24
30.0000 mg | ORAL_TABLET | Freq: Every day | ORAL | Status: DC
  Administered 2022-04-11 – 2022-04-12 (×2): 30 mg via ORAL
  Filled 2022-04-10 (×2): qty 1

## 2022-04-10 MED ORDER — SODIUM CHLORIDE 0.9 % IV SOLN
1.0000 g | Freq: Once | INTRAVENOUS | Status: AC
  Administered 2022-04-10: 1 g via INTRAVENOUS
  Filled 2022-04-10: qty 10

## 2022-04-10 MED ORDER — CARVEDILOL 3.125 MG PO TABS
3.1250 mg | ORAL_TABLET | Freq: Two times a day (BID) | ORAL | Status: DC
  Administered 2022-04-11 – 2022-04-12 (×4): 3.125 mg via ORAL
  Filled 2022-04-10 (×4): qty 1

## 2022-04-10 MED ORDER — SODIUM CHLORIDE 0.9 % IV BOLUS
500.0000 mL | Freq: Once | INTRAVENOUS | Status: AC
  Administered 2022-04-10: 500 mL via INTRAVENOUS

## 2022-04-10 MED ORDER — TRAZODONE HCL 50 MG PO TABS: 25.0000 mg | ORAL_TABLET | Freq: Every evening | ORAL | Status: DC | PRN

## 2022-04-10 MED ORDER — INSULIN GLARGINE-YFGN 100 UNIT/ML ~~LOC~~ SOLN
40.0000 [IU] | Freq: Every day | SUBCUTANEOUS | Status: DC
  Administered 2022-04-11: 40 [IU] via SUBCUTANEOUS
  Filled 2022-04-10 (×2): qty 0.4

## 2022-04-10 MED ORDER — PANTOPRAZOLE SODIUM 40 MG PO TBEC
40.0000 mg | DELAYED_RELEASE_TABLET | Freq: Every day | ORAL | Status: DC
  Administered 2022-04-11 – 2022-04-12 (×2): 40 mg via ORAL
  Filled 2022-04-10 (×2): qty 1

## 2022-04-10 MED ORDER — MAGNESIUM HYDROXIDE 400 MG/5ML PO SUSP: 30.0000 mL | Freq: Every day | ORAL | Status: DC | PRN

## 2022-04-10 MED ORDER — HYDROCODONE-ACETAMINOPHEN 7.5-325 MG PO TABS
1.0000 | ORAL_TABLET | Freq: Four times a day (QID) | ORAL | Status: DC | PRN
  Administered 2022-04-10 – 2022-04-11 (×2): 1 via ORAL
  Filled 2022-04-10 (×2): qty 1

## 2022-04-10 MED ORDER — ONDANSETRON HCL 4 MG PO TABS: 4.0000 mg | ORAL_TABLET | Freq: Four times a day (QID) | ORAL | Status: DC | PRN

## 2022-04-10 MED ORDER — SODIUM CHLORIDE 0.9 % IV SOLN: INTRAVENOUS | Status: DC

## 2022-04-10 MED ORDER — TORSEMIDE 20 MG PO TABS
20.0000 mg | ORAL_TABLET | Freq: Two times a day (BID) | ORAL | Status: DC
  Administered 2022-04-11 – 2022-04-12 (×4): 20 mg via ORAL
  Filled 2022-04-10 (×4): qty 1

## 2022-04-10 MED ORDER — NITROGLYCERIN 0.4 MG SL SUBL: 0.4000 mg | SUBLINGUAL_TABLET | SUBLINGUAL | Status: DC | PRN

## 2022-04-10 MED ORDER — INSULIN GLARGINE-YFGN 100 UNIT/ML ~~LOC~~ SOLN: 30.0000 [IU] | SUBCUTANEOUS | Status: DC

## 2022-04-10 MED ORDER — METHOCARBAMOL 500 MG PO TABS: 750.0000 mg | ORAL_TABLET | Freq: Three times a day (TID) | ORAL | Status: DC | PRN

## 2022-04-10 MED ORDER — SERTRALINE HCL 50 MG PO TABS
150.0000 mg | ORAL_TABLET | Freq: Every day | ORAL | Status: DC
  Administered 2022-04-11 – 2022-04-12 (×2): 150 mg via ORAL
  Filled 2022-04-10 (×2): qty 3

## 2022-04-10 MED ORDER — TRAZODONE HCL 50 MG PO TABS
50.0000 mg | ORAL_TABLET | Freq: Every day | ORAL | Status: DC
  Administered 2022-04-10 – 2022-04-11 (×2): 50 mg via ORAL
  Filled 2022-04-10 (×2): qty 1

## 2022-04-10 MED ORDER — POTASSIUM CHLORIDE CRYS ER 20 MEQ PO TBCR
40.0000 meq | EXTENDED_RELEASE_TABLET | Freq: Two times a day (BID) | ORAL | Status: DC
  Administered 2022-04-10 – 2022-04-12 (×4): 40 meq via ORAL
  Filled 2022-04-10 (×4): qty 2

## 2022-04-10 MED ORDER — ALBUTEROL SULFATE HFA 108 (90 BASE) MCG/ACT IN AERS: 2.0000 | INHALATION_SPRAY | Freq: Four times a day (QID) | RESPIRATORY_TRACT | Status: DC | PRN

## 2022-04-10 NOTE — H&P (Addendum)
PATIENT NAME: Tina Hayes    MR#:  [id number]  DATE OF BIRTH:  [birth year]  DATE OF ADMISSION:  04/10/2022  PRIMARY CARE PHYSICIAN: Crissie Figures, PA-C   Patient is coming from: Home  REQUESTING/REFERRING PHYSICIAN: Harvest Dark, MD  CHIEF COMPLAINT:   Chief Complaint  Patient presents with   Chest Pain    HISTORY OF PRESENT ILLNESS:  Tina Hayes is a 76 y.o. African-American female with medical history significant for HFpEF, COPD, CAD, s/p CABG X3, depression, type diabetes mellitus, hypertension, GERD, dyslipidemia, PAD and seizure disorder, who presented to the ER with acute onset of intermittent chest pain with palpitations as well as progressive generalized weakness for the last 3 to 4 days.  She described her chest pain as moderate pressure and pounding sensation with associated palpitations.  No dyspnea or nausea or vomiting or diaphoresis.  She admits to recent cough and chest congestion with wheezing that are no worse than her usual with COPD.  She stated that she has been aching all over and has been having cramps in the left leg.  No fever or chills.  She has been having dysuria and with urinary frequency and urgency and low back pain without hematuria or flank pain.  She saw her PCP 3 days ago at which time he recommended for her to go to the ER.  No paresthesias or unilateral/focal muscle weakness.  No headache or dizziness or blurred vision.  ED Course: Upon presentation to the emergency room, BP was 149/76 with otherwise normal vital signs.  Labs revealed hemoglobin of 7.7 hematocrit 27.4 compared to 8.8 and 31 on 02/05/2022 and BMP revealed calcium of 8.7.  High sensitive troponin I was 7 and later 4.  Influenza antigens and COVID-19 PCR came back negative RSV PCR was negative.  UA was positive for UTI.  Urine culture was sent. EKG as reviewed by me : EKG showed normal sinus rhythm with a rate of 61 with T wave inversion  anterolaterally. Imaging: Two-view chest x-ray showed hiatal hernia with no acute cardiopulmonary disease.  The patient was given a gram of IV Rocephin and 500 mill IV normal saline bolus.  She will be admitted to a medical bed for further evaluation and management. PAST MEDICAL HISTORY:   Past Medical History:  Diagnosis Date   (HFpEF) heart failure with preserved ejection fraction ([place])    a. 05/2016 Echo: EF 60-65%, mild to mod LVH, Gr1 DD, mild MR, mildly dil LA, mod TR, mildly to mod increased PASP; b. 07/2020 Echo: EF 55-60%, no rwma, mild LVH, GrIDD, mildly dil LA, mild MR, mild-mod TR, mild-mod AoV sclerosis w/o stenosis.   Anxiety    Arthritis    Chronic back pain    COPD (chronic obstructive pulmonary disease) (HCC)    Coronary artery disease    a. s/p remote PCI x 5;  b. 2006 s/p CABG x 3 ([place]); b. 05/2016 MV: attenuation corrected images w/o ischemia or wma-->Med rx; c. 06/2020 MV: EF 55-65%, no isch/infart-->low risk.   Depression    Diabetes mellitus without complication ([place])    Essential hypertension 06/30/2016   GERD (gastroesophageal reflux disease)    Heart attack ([place])    Total of 3 per pt.   Hyperlipidemia LDL goal <55    Hypertensive urgency 06/03/2015   PAD (peripheral artery disease) ([place])    a. 06/2019 Angio: R AT (PTA), R POP (PTA & Viabahn  stenting); b. 09/2020 ABIs: R 1.24, L 1.35.   Palpitations    a. 06/2020 Zio: Sinus brady, 54 (40-143), 4 beats NSVT, 3 SVT runs (max 143 bpm x 4 beats).   Seizure ([place])     PAST SURGICAL HISTORY:   Past Surgical History:  Procedure Laterality Date   ABDOMINAL HYSTERECTOMY     ANKLE SURGERY Right    CATARACT EXTRACTION W/ INTRAOCULAR LENS  IMPLANT, BILATERAL     COLONOSCOPY WITH PROPOFOL N/A 05/04/2017   Procedure: COLONOSCOPY WITH PROPOFOL;  Surgeon: [name], MD;  Location: [facility] ENDOSCOPY;  Service: Endoscopy;  Laterality: N/A;   CORONARY ANGIOPLASTY     CORONARY ARTERY  BYPASS GRAFT     TRIPLE BYPASS   ESOPHAGOGASTRODUODENOSCOPY N/A 02/25/2021   Procedure: ESOPHAGOGASTRODUODENOSCOPY (EGD);  Surgeon: Lin Landsman, MD;  Location: [facility] ENDOSCOPY;  Service: Gastroenterology;  Laterality: N/A;   ESOPHAGOGASTRODUODENOSCOPY (EGD) WITH PROPOFOL N/A 05/04/2017   Procedure: ESOPHAGOGASTRODUODENOSCOPY (EGD) WITH PROPOFOL;  Surgeon: [name], MD;  Location: [facility] ENDOSCOPY;  Service: Endoscopy;  Laterality: N/A;   EXCISION MASS LOWER EXTREMETIES Right 03/02/2018   Procedure: EXCISION SOFT TISSUE MASS FROM MEDIAL ASPECT OF RIGHT ANKLE;  Surgeon: [name], MD;  Location: [facility] ORS;  Service: Orthopedics;  Laterality: Right;   FRACTURE SURGERY     JOINT REPLACEMENT     right   KNEE SURGERY Right    LOWER EXTREMITY ANGIOGRAPHY Right 07/14/2019   Procedure: LOWER EXTREMITY ANGIOGRAPHY;  Surgeon: Algernon Huxley, MD;  Location: [place] CV LAB;  Service: Cardiovascular;  Laterality: Right;   MOUTH SURGERY Left 07/14/2017   TOTAL KNEE ARTHROPLASTY Left 09/27/2019   Procedure: TOTAL KNEE ARTHROPLASTY;  Surgeon: [name], MD;  Location: [facility] ORS;  Service: Orthopedics;  Laterality: Left;    SOCIAL HISTORY:   Social History   Tobacco Use   Smoking status: Former    Packs/day: 0.25    Years: 20.00    Total pack years: 5.00    Types: Cigarettes    Quit date: 07/01/2006    Years since quitting: 15.7   Smokeless tobacco: Never  Substance Use Topics   Alcohol use: No    Alcohol/week: 0.0 standard drinks of alcohol    FAMILY HISTORY:   Family History  Problem Relation Age of Onset   Diabetes Other    Hypertension Other    Diabetes Mother    Heart failure Mother    Heart disease Mother    Heart attack Mother    Stroke Mother    Depression Mother    Hypertension Mother    Cancer Sister        brain   Hypertension Sister    Diabetes Brother    Hypertension Brother    Heart failure Sister    Heart attack Sister    SIDS Sister     DRUG  ALLERGIES:   Allergies  Allergen Reactions   Aspirin Anaphylaxis, Shortness Of Breath and Swelling    LIPS AND THROAT SWELL, DIFFICULT TO BREATH    REVIEW OF SYSTEMS:   ROS As per history of present illness. All pertinent systems were reviewed above. Constitutional, HEENT, cardiovascular, respiratory, GI, GU, musculoskeletal, neuro, psychiatric, endocrine, integumentary and hematologic systems were reviewed and are otherwise negative/unremarkable except for positive findings mentioned above in the HPI.   MEDICATIONS AT HOME:   Prior to Admission medications   Medication Sig Start Date End Date Taking? Authorizing Provider  acetaminophen (TYLENOL) 500 MG tablet Take 500  mg by mouth every 6 (six) hours as needed for headache, moderate pain or fever.    [provider]  albuterol (PROVENTIL HFA;VENTOLIN HFA) 108 (90 Base) MCG/ACT inhaler Inhale 2 puffs into the lungs every 6 (six) hours as needed for wheezing or shortness of breath. 08/30/15   Epifanio Lesches, MD  albuterol (PROVENTIL) (2.5 MG/3ML) 0.083% nebulizer solution Take 2.5 mg every 4 (four) hours as needed by nebulization for wheezing or shortness of breath.    [provider]  amitriptyline (ELAVIL) 25 MG tablet Take 25 mg by mouth at bedtime. 01/20/22   [provider]  amLODipine (NORVASC) 5 MG tablet Take 1 tablet (5 mg total) by mouth daily. 10/24/21   Theora Gianotti, NP  atorvastatin (LIPITOR) 40 MG tablet Take 1 tablet (40 mg total) by mouth daily. 02/04/22   Minna Merritts, MD  B-D UF III MINI PEN NEEDLES 31G X 5 MM MISC USE WITH INSULIN PEN INJECTIONS TWICE DAILY 08/05/18   [provider]  carvedilol (COREG) 3.125 MG tablet Take 1 tablet (3.125 mg total) by mouth daily as needed. Please call to schedule an overdue appointment with Cardiology for refills, [phone], thank you. 1st attempt. 02/04/22   Minna Merritts, MD  clopidogrel (PLAVIX) 75 MG tablet Take 1 tablet (75  mg total) by mouth daily. 05/19/19   Alisa Graff, FNP  DUPIXENT 300 MG/2ML SOPN Inject into the skin. 12/30/21   [provider]  HYDROcodone-acetaminophen (NORCO) 7.5-325 MG tablet Take 1 tablet by mouth every 6 (six) hours as needed for moderate pain or severe pain. 04/03/22 05/03/22  Molli Barrows, MD  HYDROcodone-acetaminophen (NORCO) 7.5-325 MG tablet Take 1 tablet by mouth every 6 (six) hours as needed for moderate pain or severe pain. Patient not taking: Reported on 04/10/2022 05/03/22 06/02/22  Molli Barrows, MD  isosorbide mononitrate (IMDUR) 30 MG 24 hr tablet Take 1 tablet (30 mg total) by mouth daily. 02/26/21   Max Sane, MD  LANTUS SOLOSTAR 100 UNIT/ML Solostar Pen Inject 30-40 Units into the skin See admin instructions. Inject 30 units subcutaneously in the morning at 40 units subcutaneously at bedtime. 05/26/19   [provider]  levETIRAcetam (KEPPRA) 750 MG tablet Take 750 mg by mouth 2 (two) times daily.    [provider]  losartan (COZAAR) 100 MG tablet Take 1 tablet by mouth once daily 02/10/22   Minna Merritts, MD  methocarbamol (ROBAXIN) 750 MG tablet TAKE 1 TABLET BY MOUTH EVERY 8 HOURS AS NEEDED FOR MUSCLE SPASM 11/29/21   Molli Barrows, MD  montelukast (SINGULAIR) 10 MG tablet Take 10 mg by mouth daily. 02/02/22   [provider]  nitroGLYCERIN (NITROSTAT) 0.4 MG SL tablet DISSOLVE ONE TABLET UNDER THE TONGUE EVERY 5 MINUTES AS NEEDED FOR CHEST PAIN.  DO NOT EXCEED A TOTAL OF 3 DOSES IN 15 MINUTES 02/04/22   Gollan, Kathlene November, MD  omeprazole (PRILOSEC) 40 MG capsule Take 1 capsule (40 mg total) by mouth 2 (two) times daily before a meal. 02/05/22   Vanga, Tally Due, MD  ondansetron (ZOFRAN) 4 MG tablet Take 4 mg by mouth as needed. 06/19/20   [provider]  potassium chloride SA (KLOR-CON) 20 MEQ tablet Take 2 tablets (40 mEq total) by mouth 2 (two) times daily. Take extra 2 tablets (40 meq) if you take metolazone 12/13/19    Darylene Price A, FNP  sertraline (ZOLOFT) 100 MG tablet Take 150 mg by mouth daily. 11/27/21  [provider]  sucralfate (CARAFATE) 1 GM/10ML suspension Take 10 mLs (1 g total) by mouth 4 (four) times daily. 02/05/22   Lin Landsman, MD  tiotropium (SPIRIVA) 18 MCG inhalation capsule Place 18 mcg into inhaler and inhale daily.    [provider]  torsemide (DEMADEX) 20 MG tablet Take 1 tablet (20 mg) by mouth twice daily 08/03/20   Furth, Cadence H, PA-C  traZODone (DESYREL) 50 MG tablet Take 50 mg by mouth at bedtime.    [provider]      VITAL SIGNS:  Blood pressure (!) 149/76, pulse 60, temperature 98.1 F (36.7 C), temperature source Oral, resp. rate 20, height '5\' 11"'$  (1.803 m), weight 127 kg, SpO2 100 %.  PHYSICAL EXAMINATION:  Physical Exam  GENERAL:  76 y.o.-year-old African-American female patient lying in the bed with no acute distress.  EYES: Pupils equal, round, reactive to light and accommodation. No scleral icterus. Extraocular muscles intact.  HEENT: Head atraumatic, normocephalic. Oropharynx and nasopharynx clear.  NECK:  Supple, no jugular venous distention. No thyroid enlargement, no tenderness.  LUNGS: Normal breath sounds bilaterally, no wheezing, rales,rhonchi or crepitation. No use of accessory muscles of respiration.  CARDIOVASCULAR: Regular rate and rhythm, S1, S2 normal. No murmurs, rubs, or gallops.  ABDOMEN: Soft, nondistended, nontender. Bowel sounds present. No organomegaly or mass.  EXTREMITIES: No pedal edema, cyanosis, or clubbing.  NEUROLOGIC: Cranial nerves II through XII are intact. Muscle strength 5/5 in all extremities. Sensation intact. Gait not checked.  PSYCHIATRIC: The patient is alert and oriented x 3.  Normal affect and good eye contact. SKIN: No obvious rash, lesion, or ulcer.   LABORATORY PANEL:   CBC Recent Labs  Lab 04/10/22 1445  WBC 5.7  HGB 7.7*  HCT 27.4*  PLT 306    ------------------------------------------------------------------------------------------------------------------  Chemistries  Recent Labs  Lab 04/10/22 1445  NA 139  K 3.8  CL 112*  CO2 23  GLUCOSE 136*  BUN 13  CREATININE 0.78  CALCIUM 8.7*   ------------------------------------------------------------------------------------------------------------------  Cardiac Enzymes No results for input(s): "TROPONINI" in the last 168 hours. ------------------------------------------------------------------------------------------------------------------  RADIOLOGY:  DG Chest 2 View  Result Date: 04/10/2022 CLINICAL DATA:  2-3 day history of central chest pain associated with shortness of breath and lightheadedness EXAM: CHEST - 2 VIEW COMPARISON:  Chest radiograph dated 08/09/2021 FINDINGS: Normal lung volume. Rounded retrocardiac density likely corresponds to known hiatal hernia. No focal consolidations. No pleural effusion or pneumothorax. Similar postoperative cardiomediastinal silhouette. Median sternotomy wires are nondisplaced. IMPRESSION: 1. No acute cardiopulmonary disease. 2. Hiatal hernia. Electronically Signed   By: Darrin Nipper M.D.   On: 04/10/2022 15:52      IMPRESSION AND PLAN:  Assessment and Plan: * Generalized weakness - The patient will be admitted to a cardiac telemetry bed. - This is like secondary to her UTI. - PT consult will be obtained. - Management otherwise as below.  Acute lower UTI - This is likely the main culprit for #1. - We will continue IV Rocephin and follow urine culture and sensitivity.  Chest pain - She has a significant history of coronary artery disease. -- We will follow serial troponins.  So far they are negative. - The patient will be placed on as needed sublingual nitroglycerin and IV morphine sulfate. - Cardiology consult will be obtained. - I notified Dr. Fransico Him with [facility] about the patient. - Statin therapy will be resumed  and fasting lipid will be checked. - I will continue his Coreg regularly  and Cozaar as well as Imdur.  GERD without esophagitis - We will continue Carafate.  Type 2 diabetes mellitus without complications ([place]) - The patient will be placed on supplement coverage with NovoLog. - We will continue his basal coverage.  Chronic obstructive pulmonary disease (COPD) (HCC) - We will continue her inhalers.  Dyslipidemia - We will continue statin therapy.  Depression - We will continue Zoloft.  Essential hypertension - We will continue his antihypertensives.    DVT prophylaxis: Lovenox. Advanced Care Planning:  Code Status: The patient is DNR/DNI.  This was discussed with her. Family Communication:  The plan of care was discussed in details with the patient (and family). I answered all questions. The patient agreed to proceed with the above mentioned plan. Further management will depend upon hospital course. Disposition Plan: Back to previous home environment Consults called: none.  All the records are reviewed and case discussed with ED provider.  Status is: Inpatient   At the time of the admission, it appears that the appropriate admission status for this patient is inpatient.  This is judged to be reasonable and necessary in order to provide the required intensity of service to ensure the patient's safety given the presenting symptoms, physical exam findings and initial radiographic and laboratory data in the context of comorbid conditions.  The patient requires inpatient status due to high intensity of service, high risk of further deterioration and high frequency of surveillance required.  I certify that at the time of admission, it is my clinical judgment that the patient will require inpatient hospital care extending more than 2 midnights.                            Dispo: The patient is from: Home              Anticipated d/c is to: Home              Patient currently is not  medically stable to d/c.              Difficult to place patient: No  Christel Mormon M.D on 04/10/2022 at 10:51 PM  [facility]   From 7 PM-7 AM, contact night-coverage [email or web address]  CC: Primary care physician; Crissie Figures, PA-C

## 2022-04-10 NOTE — Progress Notes (Signed)
PHARMACIST - PHYSICIAN COMMUNICATION  CONCERNING:  Enoxaparin (Lovenox) for DVT Prophylaxis    RECOMMENDATION: Patient was prescribed enoxaprin '40mg'$  q24 hours for VTE prophylaxis.   Filed Weights   04/10/22 1443  Weight: 127 kg (280 lb)    Body mass index is 39.05 kg/m.  Estimated Creatinine Clearance: 90.9 mL/min (by C-G formula based on SCr of 0.78 mg/dL).   Based on [place] patient is candidate for enoxaparin 0.'5mg'$ /kg TBW SQ every 24 hours based on BMI being >30.  DESCRIPTION: Pharmacy has adjusted enoxaparin dose per [facility] policy.  Patient is now receiving enoxaparin 0.5 mg/kg every 24 hours   Renda Rolls, PharmD, [facility] 04/10/2022 9:39 PM

## 2022-04-10 NOTE — Assessment & Plan Note (Signed)
-   We will continue Carafate.

## 2022-04-10 NOTE — ED Triage Notes (Signed)
[facility] brought pt in for chest pain x2 days. Pt seen at PCP 2 days ago for the chest pain and declined transfer and went home. Chest pain has been constant so coming in.   3 SL nitro given by EMS and pt did get relief.   EMS Vitals  128/92 BP 60 HR 95% RA  20G, R C

## 2022-04-10 NOTE — Assessment & Plan Note (Signed)
-   The patient will be admitted to a cardiac telemetry bed. - This is like secondary to her UTI. - PT consult will be obtained. - Management otherwise as below.

## 2022-04-10 NOTE — Assessment & Plan Note (Signed)
-   We will continue Zoloft 

## 2022-04-10 NOTE — Assessment & Plan Note (Signed)
-   The patient will be placed on supplement coverage with NovoLog. - We will continue his basal coverage. 

## 2022-04-10 NOTE — Assessment & Plan Note (Signed)
-   We will continue her inhalers.

## 2022-04-10 NOTE — Assessment & Plan Note (Signed)
-   We will continue his antihypertensives. 

## 2022-04-10 NOTE — ED Provider Notes (Signed)
[facility] Provider Note    Event Date/Time   First MD Initiated Contact with Patient 04/10/22 1537     (approximate)  History   Chief Complaint: Chest Pain  HPI  Tina Hayes is a 76 y.o. female with a past medical history of CHF, anxiety, COPD, pretension, gastric reflux, presents to the emergency department for intermittent chest pains/palpitations as well as weakness.  According to the patient over the past 3 to 4 days she has been experiencing intermittent chest palpitations which she describes as a pounding sensation in her chest.  Denies any palpitations currently.  Patient states at times it is uncomfortable to it but denies any chest pain currently.  Patient denies any shortness of breath.  States she has had a cough and congestion over the past several days as well but denies any known fever.  Patient also states 2 to 3 days of dysuria.  Patient states she saw her doctor 3 days ago who recommended she come to the emergency department for further evaluation.  Patient denies any focal weakness, states just feels more fatigued and weak than typical.  Patient states longstanding anemia but takes iron supplements.  Physical Exam   Triage Vital Signs: ED Triage Vitals  Enc Vitals Group     BP 04/10/22 1442 (!) 149/76     Pulse Rate 04/10/22 1442 60     Resp 04/10/22 1442 20     Temp 04/10/22 1442 98.1 F (36.7 C)     Temp Source 04/10/22 1442 Oral     SpO2 04/10/22 1442 100 %     Weight 04/10/22 1443 280 lb (127 kg)     Height 04/10/22 1443 '5\' 11"'$  (1.803 m)     Head Circumference --      Peak Flow --      Pain Score 04/10/22 1440 6     Pain Loc --      Pain Edu? --      Excl. in [place]? --     Most recent vital signs: Vitals:   04/10/22 1442  BP: (!) 149/76  Pulse: 60  Resp: 20  Temp: 98.1 F (36.7 C)  SpO2: 100%    General: Awake, no distress.  CV:  Good peripheral perfusion.  Regular rate and rhythm  Resp:  Normal effort.  Equal breath  sounds bilaterally.  Abd:  No distention.  Soft, nontender.  No rebound or guarding.  Obese.    ED Results / Procedures / Treatments   EKG  EKG viewed and interpreted by myself shows a normal sinus rhythm at 61 bpm with a narrow QRS, normal axis, normal intervals, no concerning ST changes.  RADIOLOGY  I have reviewed the patient's chest x-ray images.  On my evaluation I do not see any obvious consolidation. Radiology has read the chest x-ray is negative for acute disease but positive for hiatal hernia   MEDICATIONS ORDERED IN ED: Medications  sodium chloride 0.9 % bolus 500 mL (has no administration in time range)     IMPRESSION / MDM / ASSESSMENT AND PLAN / ED COURSE  I reviewed the triage vital signs and the nursing notes.  Patient's presentation is most consistent with acute presentation with potential threat to life or bodily function.  Patient presents emergency department for 3 days of intermittent chest palpitations/pounding as well as generalized fatigue and weakness.  Also states 2 to 3 days of cough and congestion as well as 2 to 3 days of dysuria.  Patient has a benign abdominal exam.  Denies any chest discomfort currently.  Patient appears to be in a nice normal sinus rhythm at 60 bpm throughout my evaluation.  Lab work is thus far reassuring with a reassuring chemistry including renal function.  Patient CBC does show hemoglobin of 7.7 however patient's baseline appears to be between 8 and 9 there is mild decrease from baseline could account for some of the patient's weakness and fatigue.  Patient states she does take iron supplements, MCV of 75 discussed with the patient follow-up with hematology for further evaluation and possible iron infusions or blood transfusions if needed in the future however as the patient's hemoglobin is above 7 and longstanding history of anemia I do not believe the patient requires an urgent or emergent blood transfusion and I believe this is less  likely contributing to the patient's current symptoms.  Patient is troponin is negative.  Chest x-ray is clear and EKG is reassuring.  We will obtain a COVID/flu/RSV swab as precaution we will also obtain a urine sample to rule out urinary tract infection.  Will gently hydrate while awaiting results.  Patient agreeable to plan of care.  Patient's COVID/flu/RSV is negative.  Urinalysis however does show greater than 50 white cells with bacteria and white blood cell clumps likely indicating significant urinary tract infection.  Patient states burning with urination and now weakness to the point where she is having trouble ambulating.  Given the patient's urinary tract infection weakness and difficulty ambulating we will admit to the hospital service for further workup and treatment.  I have ordered IV Rocephin as well as a urine culture.  FINAL CLINICAL IMPRESSION(S) / ED DIAGNOSES   Weakness Urinary tract infection    Note:  This document was prepared using Dragon voice recognition software and may include unintentional dictation errors.   Harvest Dark, MD 04/10/22 2115

## 2022-04-10 NOTE — Assessment & Plan Note (Signed)
-   This is likely the main culprit for #1. - We will continue IV Rocephin and follow urine culture and sensitivity.

## 2022-04-10 NOTE — Assessment & Plan Note (Signed)
-   We will continue statin therapy. 

## 2022-04-10 NOTE — Assessment & Plan Note (Addendum)
-   She has a significant history of coronary artery disease. -- We will follow serial troponins.  So far they are negative. - The patient will be placed on as needed sublingual nitroglycerin and IV morphine sulfate. - Cardiology consult will be obtained. - I notified Dr. Fransico Him with [facility] about the patient. - Statin therapy will be resumed and fasting lipid will be checked. - I will continue his Coreg regularly and Cozaar as well as Imdur.

## 2022-04-10 NOTE — ED Triage Notes (Signed)
Pt states she has had central chest pain for 2-3 days and has been feeling light headed. Pt states she has been SOB, denies N/V. Pt denies radiation.

## 2022-04-11 DIAGNOSIS — N390 Urinary tract infection, site not specified: Secondary | ICD-10-CM | POA: Diagnosis not present

## 2022-04-11 DIAGNOSIS — R0789 Other chest pain: Secondary | ICD-10-CM | POA: Diagnosis present

## 2022-04-11 DIAGNOSIS — R079 Chest pain, unspecified: Secondary | ICD-10-CM | POA: Diagnosis not present

## 2022-04-11 DIAGNOSIS — D649 Anemia, unspecified: Secondary | ICD-10-CM | POA: Diagnosis not present

## 2022-04-11 DIAGNOSIS — R531 Weakness: Secondary | ICD-10-CM | POA: Diagnosis not present

## 2022-04-11 LAB — BASIC METABOLIC PANEL
Anion gap: 6 (ref 5–15)
BUN: 11 mg/dL (ref 8–23)
CO2: 25 mmol/L (ref 22–32)
Calcium: 8.5 mg/dL — ABNORMAL LOW (ref 8.9–10.3)
Chloride: 109 mmol/L (ref 98–111)
Creatinine, Ser: 0.84 mg/dL (ref 0.44–1.00)
GFR, Estimated: >60 mL/min (ref >60)
Glucose, Bld: 175 mg/dL — ABNORMAL HIGH (ref 70–99)
Potassium: 3.6 mmol/L (ref 3.5–5.1)
Sodium: 140 mmol/L (ref 135–145)

## 2022-04-11 LAB — CBC
HCT: 24.4 % — ABNORMAL LOW (ref 36.0–46.0)
Hemoglobin: 6.9 g/dL — ABNORMAL LOW (ref 12.0–15.0)
MCH: 21.2 pg — ABNORMAL LOW (ref 26.0–34.0)
MCHC: 28.3 g/dL — ABNORMAL LOW (ref 30.0–36.0)
MCV: 75.1 fL — ABNORMAL LOW (ref 80.0–100.0)
Platelets: 264 10*3/uL (ref 150–400)
RBC: 3.25 MIL/uL — ABNORMAL LOW (ref 3.87–5.11)
RDW: 16.5 % — ABNORMAL HIGH (ref 11.5–15.5)
WBC: 5.3 10*3/uL (ref 4.0–10.5)
nRBC: 0 % (ref 0.0–0.2)

## 2022-04-11 LAB — TROPONIN I (HIGH SENSITIVITY): Troponin I (High Sensitivity): 6 ng/L (ref <18)

## 2022-04-11 LAB — CBG MONITORING, ED: Glucose-Capillary: 135 mg/dL — ABNORMAL HIGH (ref 70–99)

## 2022-04-11 LAB — PREPARE RBC (CROSSMATCH)

## 2022-04-11 LAB — GLUCOSE, CAPILLARY: Glucose-Capillary: 218 mg/dL — ABNORMAL HIGH (ref 70–99)

## 2022-04-11 MED ORDER — SODIUM CHLORIDE 0.9% IV SOLUTION
Freq: Once | INTRAVENOUS | Status: AC
  Filled 2022-04-11: qty 250

## 2022-04-11 MED ORDER — POLYSACCHARIDE IRON COMPLEX 150 MG PO CAPS
150.0000 mg | ORAL_CAPSULE | Freq: Every day | ORAL | Status: DC
  Administered 2022-04-11 – 2022-04-12 (×2): 150 mg via ORAL
  Filled 2022-04-11 (×2): qty 1

## 2022-04-11 NOTE — ED Notes (Signed)
Lab at bedside to draw blood.

## 2022-04-11 NOTE — Progress Notes (Signed)
       CROSS COVER NOTE  NAME: [name] MRN: [id number] DOB : [birth year]    HPI/Events of Note   Nurse reports HGB 6.9, down from 7.7 yesterday No reports of chest pain and no overt signs of bleeding. Troponin was  not elevated and trend is flat  Assessment and  Interventions   Assessment: BP stable, heart rate sinus brady Plan: Defer transfusion of blood at this time       Kathlene Cote NP [facility]

## 2022-04-11 NOTE — ED Notes (Signed)
Pt eating meal tray 

## 2022-04-11 NOTE — ED Notes (Signed)
MD Zhang notified of patient hemoglobin dropping from 7.6 to 6.9.

## 2022-04-11 NOTE — ED Notes (Signed)
Assumed care from [place], [place]. Pt resting comfortably in bed at this time. Pt denies any current needs or questions. Call light with in reach.

## 2022-04-11 NOTE — Hospital Course (Signed)
Tina Hayes is a 76 y.o. African-American female with medical history significant for HFpEF, COPD, CAD, s/p CABG X3, depression, type diabetes mellitus, hypertension, GERD, dyslipidemia, PAD and seizure disorder, who presented to the ER with acute onset of intermittent chest pain with palpitations as well as progressive generalized weakness for the last 3 to 4 days.  Has abnormal urine, was started on Rocephin for UTI.  Patient also had significant anemia initial hemoglobin was 7.7, dropped down to 6.9, which reflects a significant drop of hemoglobin from baseline.  However patient does not have any active of black stool or rectal bleeding.  Received 1 unit PRBC.

## 2022-04-11 NOTE — ED Notes (Signed)
Attempted to get secondary IV access. Unsuccessful attempts. Unable to obtain type and screen from existing IV. IV team consulted.

## 2022-04-11 NOTE — ED Notes (Signed)
RN called lab to get type and screen.

## 2022-04-11 NOTE — Progress Notes (Signed)
  Progress Note   Patient: Tina Hayes DOB: 1945-11-10 DOA: 04/10/2022     1 DOS: the patient was seen and examined on 04/11/2022   Brief hospital course: Tina Hayes is a 76 y.o. African-American female with medical history significant for HFpEF, COPD, CAD, s/p CABG X3, depression, type diabetes mellitus, hypertension, GERD, dyslipidemia, PAD and seizure disorder, who presented to the ER with acute onset of intermittent chest pain with palpitations as well as progressive generalized weakness for the last 3 to 4 days.  Has abnormal urine, was started on Rocephin for UTI.  Patient also had significant anemia initial hemoglobin was 7.7, dropped down to 6.9, which reflects a significant drop of hemoglobin from baseline.  However patient does not have any active of black stool or rectal bleeding.  Received 1 unit PRBC.  Assessment and Plan: * Generalized weakness Chest pain Coronary artery disease. Acute on chronic anemia. Intermittent chest pain over the last 3 to 4 days.  She also experienced significant short of breath with exertion.  Her troponin has been negative. Reviewed patient prior labs, patient has a chronic anemia, with average hemoglobin level between 9 and 10.  Hemoglobin was 7.7 at admission, dropped down to 6.9.  Chest pain and shortness of breath most likely due to worsening anemia.  Transfuse 1 unit PRBC. Based on prior labs on 01/2022, patient had a normal B12, borderline low ferritin level.  Will start oral iron treatment.  Patient be referred to hematologist by oncology after discharge. Discussed with cardiology, patient condition most likely due to worsening anemia.  However, if chest pain persist after transfusion, will obtain formal consult.  Acute lower UTI Continue Rocephin.   GERD without esophagitis We will continue Carafate.  Type 2 diabetes mellitus without complications (HCC) Continue current regimen.  Chronic obstructive pulmonary disease  (COPD) (HCC) No bronchospasm.  Dyslipidemia  We will continue statin therapy.  Essential hypertension Continue home medicines..       Subjective:  Patient complaining of intermittent chest pain.  No shortness of breath today.  Physical Exam: Vitals:   04/11/22 0353 04/11/22 0421 04/11/22 0619 04/11/22 0901  BP:   (!) 160/67 (!) 157/69  Pulse: 60  64 62  Resp:   20 20  Temp:  98 F (36.7 C)  98.4 F (36.9 C)  TempSrc:  Axillary  Oral  SpO2: 100%  100% 99%  Weight:      Height:       General exam: Appears calm and comfortable  Respiratory system: Clear to auscultation. Respiratory effort normal. Cardiovascular system: S1 & S2 heard, RRR. No JVD, murmurs, rubs, gallops or clicks. No pedal edema. Gastrointestinal system: Abdomen is nondistended, soft and nontender. No organomegaly or masses felt. Normal bowel sounds heard. Central nervous system: Alert and oriented. No focal neurological deficits. Extremities: Symmetric 5 x 5 power. Skin: No rashes, lesions or ulcers Psychiatry: Judgement and insight appear normal. Mood & affect appropriate.   Data Reviewed:  Lab results reviewed.  Family Communication: None  Disposition: Status is: Inpatient Remains inpatient appropriate because: Severity of disease.  IV antibiotics.  Planned Discharge Destination: Home    Time spent: 35 minutes  Author: Sharen Hones, MD 04/11/2022 12:40 PM  For on call review [email or web address].

## 2022-04-11 NOTE — ED Notes (Signed)
IV team at bedside 

## 2022-04-11 NOTE — ED Notes (Signed)
Pt given diet gingerale per her request.

## 2022-04-11 NOTE — ED Notes (Signed)
Notified B Randol Kern that pt HGB declined from 7.7 to 6.9 from yesterday to this morning.

## 2022-04-12 DIAGNOSIS — R531 Weakness: Secondary | ICD-10-CM | POA: Diagnosis not present

## 2022-04-12 DIAGNOSIS — D649 Anemia, unspecified: Secondary | ICD-10-CM

## 2022-04-12 DIAGNOSIS — R0789 Other chest pain: Secondary | ICD-10-CM

## 2022-04-12 DIAGNOSIS — E119 Type 2 diabetes mellitus without complications: Secondary | ICD-10-CM | POA: Diagnosis not present

## 2022-04-12 LAB — CBC
HCT: 29.7 % — ABNORMAL LOW (ref 36.0–46.0)
Hemoglobin: 8.7 g/dL — ABNORMAL LOW (ref 12.0–15.0)
MCH: 21.6 pg — ABNORMAL LOW (ref 26.0–34.0)
MCHC: 29.3 g/dL — ABNORMAL LOW (ref 30.0–36.0)
MCV: 73.7 fL — ABNORMAL LOW (ref 80.0–100.0)
Platelets: 318 10*3/uL (ref 150–400)
RBC: 4.03 MIL/uL (ref 3.87–5.11)
RDW: 16.9 % — ABNORMAL HIGH (ref 11.5–15.5)
WBC: 6.7 10*3/uL (ref 4.0–10.5)
nRBC: 0 % (ref 0.0–0.2)

## 2022-04-12 LAB — TYPE AND SCREEN
ABO/RH(D): O POS
Antibody Screen: NEGATIVE
Unit division: 0

## 2022-04-12 LAB — BPAM RBC
Blood Product Expiration Date: 202401142359
ISSUE DATE / TIME: 202312151238
Unit Type and Rh: 5100

## 2022-04-12 MED ORDER — NITROFURANTOIN MONOHYD MACRO 100 MG PO CAPS: 100.0000 mg | ORAL_CAPSULE | Freq: Two times a day (BID) | ORAL | Status: DC

## 2022-04-12 MED ORDER — POLYSACCHARIDE IRON COMPLEX 150 MG PO CAPS: 150.0000 mg | ORAL_CAPSULE | Freq: Every day | ORAL | 0 refills | Status: DC

## 2022-04-12 NOTE — Progress Notes (Signed)
Received Md order to discharge patient to home.  I reviewed discharge instructions, home meds, follow up appointments and prescriptions with patient and patent verbalizing understanding

## 2022-04-12 NOTE — Progress Notes (Signed)
Chaplain responded to a SCC, request for prayer.  Pt appears weary, speaks very softly.  Requested prayer due to pain (which has been going on for "50 years") as well as loss.  Her family recently (last few months) experienced the deaths of pt's nephew and brother.  Pt is [employer] and very much enjoys [place] music, especially "I'm a nobody" by the [employer].    Chaplain provided emotional, spiritual and grief support, played favorite music, and ended visit with prayer.  Please call as needed for f/u.  Minus Liberty, Chaplain Pager:  [phone]    04/12/22 1000  Clinical Encounter Type  Visited With Patient  Visit Type Initial;Spiritual support  Referral From  [facility])  Consult/Referral To Chaplain  Spiritual Encounters  Spiritual Needs Prayer  Stress Factors  Patient Stress Factors Exhausted;Loss of control  Family Stress Factors Loss

## 2022-04-12 NOTE — Progress Notes (Signed)
     [facility] REFERRAL        Occupational Therapy * Physical Therapy * Speech Therapy                           DATE: 04/12/22  PATIENT NAME: Tina Hayes PATIENT MRN: [id number]       DIAGNOSIS/DIAGNOSIS CODE: R53.1  DATE OF DISCHARGE: 04/12/22       PRIMARY CARE PHYSICIAN:   Crissie Figures PA    PCP PHONE/FAX PHONE: [phone]    Dear Provider: [facility] outpatient rehab.  Fax: [phone]   I certify that I have examined this patient and that occupational/physical/speech therapy is necessary on an outpatient basis.    The patient has expressed interest in completing their recommended course of therapy at your  location.  Once a formal order from the patient's primary care physician has been obtained, please contact him/her to schedule an appointment for evaluation at your earliest convenience.   Valu.Nieves ]  Physical Therapy Evaluate and Treat  [ X ]  Occupational Therapy Evaluate and Treat  [  ]  Speech Therapy Evaluate and Treat     The patient's primary care physician (listed above) must furnish and be responsible for a formal order such that the recommended services may be furnished while under the primary physician's care, and that the plan of care will be established and reviewed every 30 days (or more often if condition necessitates).

## 2022-04-12 NOTE — Evaluation (Signed)
Occupational Therapy Evaluation Patient Details Name: Tina Hayes MRN: [id number] DOB: 1946-01-02 Today's Date: 04/12/2022   History of Present Illness Pt is a 76 year old female admitted with UTI; PMH significant for HFpEF, COPD, CAD, s/p CABG X3, depression, type diabetes mellitus, hypertension, GERD, dyslipidemia, PAD and seizure disorder   Clinical Impression   Chart reviewed, RN cleared pt for participation in OT evaluation. Pt is alert and oriented x4, agreeable to OT evaluation. Pt endorses multiple falls recently, typically amb with SPC, is MOD I in ADL, assist for IADL as needed. Pt presents with deficits in strength, endurance, activity tolerance, balance affecting safe and optimal ADL completion. Supervision required for bed mobility, STS with supervision, amb in room with supervision-CGA with RW, MIN A for LB dressing, SET UP for grooming tasks. Recommend discharge home with HHOT to address functional deficits. OT will continue to follow acutely.      Recommendations for follow up therapy are one component of a multi-disciplinary discharge planning process, led by the attending physician.  Recommendations may be updated based on patient status, additional functional criteria and insurance authorization.   Follow Up Recommendations  Home health OT     Assistance Recommended at Discharge Frequent or constant Supervision/Assistance  Patient can return home with the following A little help with walking and/or transfers;A little help with bathing/dressing/bathroom    Functional Status Assessment  Patient has had a recent decline in their functional status and demonstrates the ability to make significant improvements in function in a reasonable and predictable amount of time.  Equipment Recommendations  Other (comment) (2WW)    Recommendations for Other Services       Precautions / Restrictions Precautions Precautions: Fall Restrictions Weight Bearing Restrictions: No       Mobility Bed Mobility Overal bed mobility: Needs Assistance Bed Mobility: Supine to Sit     Supine to sit: Supervision          Transfers Overall transfer level: Needs assistance Equipment used: Rolling walker (2 wheels) Transfers: Sit to/from Stand Sit to Stand: Supervision                  Balance Overall balance assessment: Needs assistance Sitting-balance support: Bilateral upper extremity supported, Feet supported Sitting balance-Leahy Scale: Good     Standing balance support: Bilateral upper extremity supported, During functional activity, Reliant on assistive device for balance Standing balance-Leahy Scale: Fair                             ADL either performed or assessed with clinical judgement   ADL Overall ADL's : Needs assistance/impaired Eating/Feeding: Set up;Sitting   Grooming: Set up;Sitting           Upper Body Dressing : Set up;Sitting   Lower Body Dressing: Minimal assistance;Sitting/lateral leans Lower Body Dressing Details (indicate cue type and reason): socks Toilet Transfer: Supervision/safety;Min guard;Rolling walker (2 wheels);Ambulation Toilet Transfer Details (indicate cue type and reason): simulated         Functional mobility during ADLs: Supervision/safety;Min guard;Rolling walker (2 wheels) (approx 25' in room)       Vision Patient Visual Report: No change from baseline       Perception     Praxis      Pertinent Vitals/Pain Pain Assessment Pain Assessment: No/denies pain     Hand Dominance     Extremity/Trunk Assessment Upper Extremity Assessment Upper Extremity Assessment: Overall WFL for tasks assessed  Lower Extremity Assessment Lower Extremity Assessment: Generalized weakness       Communication Communication Communication: No difficulties   Cognition Arousal/Alertness: Awake/alert Behavior During Therapy: WFL for tasks assessed/performed Overall Cognitive Status: Within  Functional Limits for tasks assessed                                       General Comments  BP 102/59 (MAP 72) HR 66 in sitting, 115/89 (Map 97) HR 98 in standing; endorses dizziness throughout all position changes and mobility, unchanged    Exercises Other Exercises Other Exercises: edu re: role of OT, role of rehab, discharge recommendations, home safety, falls prevention, DME use   Shoulder Instructions      Home Living Family/patient expects to be discharged to:: Private residence Living Arrangements: Children;Other relatives Available Help at Discharge: Family;Available 24 hours/day Type of Home: House Home Access: Level entry;Stairs to enter Entrance Stairs-Number of Steps: 5 Entrance Stairs-Rails: None Home Layout: One level     Bathroom Shower/Tub: Teacher, early years/pre: Standard     Home Equipment: Cane - single point;BSC/3in1          Prior Functioning/Environment Prior Level of Function : Needs assist;Independent/Modified Independent;History of Falls (last six months)             Mobility Comments: amb wtihj SPC, multiple falls the last week ADLs Comments: generally MOD I with ADL, assist with IADLs from family as needed        OT Problem List: Decreased strength;Decreased activity tolerance;Impaired balance (sitting and/or standing);Decreased knowledge of use of DME or AE      OT Treatment/Interventions: Self-care/ADL training;Patient/family education;Therapeutic exercise;Balance training;Energy conservation;Therapeutic activities;DME and/or AE instruction    OT Goals(Current goals can be found in the care plan section) Acute Rehab OT Goals Patient Stated Goal: go home OT Goal Formulation: With patient Time For Goal Achievement: 04/26/22 Potential to Achieve Goals: Good ADL Goals Pt Will Perform Grooming: with modified independence;sitting Pt Will Perform Lower Body Dressing: with modified independence;sit to/from  stand Pt Will Transfer to Toilet: with modified independence;ambulating Pt Will Perform Toileting - Clothing Manipulation and hygiene: with modified independence;sit to/from stand  OT Frequency: Min 2X/week    Co-evaluation              AM-PAC OT "6 Clicks" Daily Activity     Outcome Measure Help from another person eating meals?: None Help from another person taking care of personal grooming?: None Help from another person toileting, which includes using toliet, bedpan, or urinal?: A Little Help from another person bathing (including washing, rinsing, drying)?: A Little Help from another person to put on and taking off regular upper body clothing?: None Help from another person to put on and taking off regular lower body clothing?: A Little 6 Click Score: 21   End of Session Equipment Utilized During Treatment: Rolling walker (2 wheels) Nurse Communication: Mobility status  Activity Tolerance: Patient tolerated treatment well Patient left: in chair;with call bell/phone within reach;with chair alarm set  OT Visit Diagnosis: Unsteadiness on feet (R26.81);History of falling (Z91.81)                Time: 4098-1191 OT Time Calculation (min): 25 min Charges:  OT General Charges $OT Visit: 1 Visit OT Evaluation $OT Eval Moderate Complexity: 1 Mod  Shanon Payor, OTD OTR/L  04/12/22, 1:57 PM

## 2022-04-12 NOTE — TOC Progression Note (Addendum)
Transition of Care [facility]) - Progression Note    Patient Details  Name: Tina Hayes MRN: [id number] Date of Birth: 06-05-1945  Transition of Care [facility]) CM/SW Contact  Izola Price, RN Phone Number: 04/12/2022, 3:06 PM  Clinical Narrative: 12/16: Spoke with patient about [facility] [place] therapies on discharge. Patient is unsure if daughter-in-law will agree to having them come in to home. Contacted son and left a VM to call RN CM back as well as patient will need transportation home today on discharge. Simmie Davies RN CM   340 pm. Son returned call and requested outpatient therapy services. He chooses [facility] outpatient Rehab. Order placed in progress note for co-signing. Son will transport home on discharge. Signed therapy order routing internally via EPIC and hard faxed to [facility] outpatient rehab at [phone].Simmie Davies RN CM            Expected Discharge Plan and Services           Expected Discharge Date: 04/12/22                                     Social Determinants of Health (SDOH) Interventions    Readmission Risk Interventions     No data to display

## 2022-04-12 NOTE — Evaluation (Signed)
Physical Therapy Evaluation Patient Details Name: Tina Hayes MRN: [id number] DOB: [birth year] Today's Date: 04/12/2022  History of Present Illness  Pt is a 76 year old female admitted with UTI; PMH significant for HFpEF, COPD, CAD, s/p CABG X3, depression, type diabetes mellitus, hypertension, GERD, dyslipidemia, PAD and seizure disorder.   Clinical Impression  Pt admitted with above diagnosis. Pt received upright in bed agreeable to PT services. Pt reports at baseline she is mod-I with SPC. Has hx of multiple falls due to her lumbar issues leading to BLE weakness and Numbness. Has 24/7 support at home.   To date pt mod-I with bed mobility and stands with supervision. 2x40' gait trial with SPC then with RW. With RW pt performs step through gait. With [facility] pt performs step to pattern, significant RLE truncal lean to clear LLE in swing phase and limited stance time on LLE throughout. Pt with significant reduction in gait speed with SPC and need for LUE reaching for surfaces in room for stability. Pt returning to supine in bed with all needs in reach. Pt educated on benefits of RW to reduce significant falls risk compared to [facility]. Also educated on appropriate SPC height with pt verbalizing understanding. All needs in reach. Pt currently with functional limitations due to the deficits listed below (see PT Problem List). Pt will benefit from skilled PT to increase their independence and safety with mobility to allow discharge to the venue listed below.      Recommendations for follow up therapy are one component of a multi-disciplinary discharge planning process, led by the attending physician.  Recommendations may be updated based on patient status, additional functional criteria and insurance authorization.  Follow Up Recommendations Home health PT      Assistance Recommended at Discharge Frequent or constant Supervision/Assistance  Patient can return home with the following  A little help with  walking and/or transfers;Assistance with cooking/housework;Assist for transportation;Help with stairs or ramp for entrance    Equipment Recommendations Rolling walker (2 wheels)  Recommendations for Other Services       Functional Status Assessment Patient has had a recent decline in their functional status and demonstrates the ability to make significant improvements in function in a reasonable and predictable amount of time.     Precautions / Restrictions Precautions Precautions: Fall Restrictions Weight Bearing Restrictions: No      Mobility  Bed Mobility Overal bed mobility: Modified Independent             General bed mobility comments: bed features Patient Response: Cooperative  Transfers Overall transfer level: Needs assistance Equipment used: None Transfers: Sit to/from Stand Sit to Stand: Supervision, From elevated surface           General transfer comment: elevated bed to mimic home set up    Ambulation/Gait Ambulation/Gait assistance: Min guard Gait Distance (Feet): 80 Feet (2x20' with RW, 2x20' with [place]) Assistive device: Rolling walker (2 wheels), Straight cane Gait Pattern/deviations: Step-to pattern, Step-through pattern, Decreased step length - right, Decreased step length - left, Decreased stance time - left, Decreased weight shift to right       General Gait Details: Trialed gait with SPC and RW. With [facility] pt with signficant step to pattern and limited weight placed on LLE. With RW pt able to ambulate with step through pattern and quicked gait cadence.,  Stairs            Wheelchair Mobility    Modified Rankin (Stroke Patients Only)  Balance Overall balance assessment: Needs assistance Sitting-balance support: Bilateral upper extremity supported, Feet supported Sitting balance-Leahy Scale: Good       Standing balance-Leahy Scale: Fair Standing balance comment: Can maintain standing without AD                              Pertinent Vitals/Pain Pain Assessment Pain Assessment: No/denies pain    Home Living Family/patient expects to be discharged to:: Private residence Living Arrangements: Children;Other relatives Available Help at Discharge: Family;Available 24 hours/day Type of Home: House Home Access: Level entry;Stairs to enter Entrance Stairs-Rails: None Entrance Stairs-Number of Steps: 5   Home Layout: One level Home Equipment: Cane - single point;BSC/3in1      Prior Function Prior Level of Function : Needs assist;Independent/Modified Independent;History of Falls (last six months)             Mobility Comments: amb wtihj SPC, multiple falls the last week ADLs Comments: generally MOD I with ADL, assist with IADLs from family as needed     Hand Dominance        Extremity/Trunk Assessment   Upper Extremity Assessment Upper Extremity Assessment: Overall WFL for tasks assessed    Lower Extremity Assessment Lower Extremity Assessment: Generalized weakness       Communication   Communication: No difficulties  Cognition Arousal/Alertness: Awake/alert Behavior During Therapy: WFL for tasks assessed/performed Overall Cognitive Status: Within Functional Limits for tasks assessed                                 General Comments: Pleasant and cooperative        General Comments General comments (skin integrity, edema, etc.): BP 102/59 (MAP 72) HR 66 in sitting, 115/89 (Map 97) HR 98 in standing; endorses dizziness throughout all position changes and mobility, unchanged    Exercises Other Exercises Other Exercises: Role of PT in acute setting, d/c recs, gait training SPC versus RW, d/c recs, falls risk with SPC, energy conservation techniques.   Assessment/Plan    PT Assessment Patient needs continued PT services  PT Problem List Decreased strength;Obesity;Decreased activity tolerance;Decreased balance;Decreased mobility       PT Treatment  Interventions DME instruction;Balance training;Gait training;Neuromuscular re-education;Stair training;Functional mobility training;Patient/family education;Therapeutic activities;Therapeutic exercise    PT Goals (Current goals can be found in the Care Plan section)  Acute Rehab PT Goals Patient Stated Goal: to go home to her family PT Goal Formulation: With patient Time For Goal Achievement: 04/26/22 Potential to Achieve Goals: Good    Frequency Min 2X/week     Co-evaluation               AM-PAC PT "6 Clicks" Mobility  Outcome Measure Help needed turning from your back to your side while in a flat bed without using bedrails?: A Little Help needed moving from lying on your back to sitting on the side of a flat bed without using bedrails?: A Little Help needed moving to and from a bed to a chair (including a wheelchair)?: A Little Help needed standing up from a chair using your arms (e.g., wheelchair or bedside chair)?: A Little Help needed to walk in hospital room?: A Little Help needed climbing 3-5 steps with a railing? : A Lot 6 Click Score: 17    End of Session Equipment Utilized During Treatment: Gait belt Activity Tolerance: Patient tolerated treatment well Patient left: in  bed;with call bell/phone within reach;with bed alarm set Nurse Communication: Mobility status PT Visit Diagnosis: Unsteadiness on feet (R26.81);Difficulty in walking, not elsewhere classified (R26.2);Other abnormalities of gait and mobility (R26.89);Muscle weakness (generalized) (M62.81);History of falling (Z91.81)    Time: [id number] PT Time Calculation (min) (ACUTE ONLY): 23 min   Charges:   PT Evaluation $PT Eval Moderate Complexity: 1 Mod PT Treatments $Gait Training: 8-22 mins        Salem Caster. Fairly IV, PT, DPT Physical Therapist- [facility]  04/12/2022, 1:55 PM

## 2022-04-12 NOTE — Discharge Summary (Addendum)
Physician Discharge Summary   Patient: Tina Hayes MRN: [id number] DOB: 1945-07-01  Admit date:     04/10/2022  Discharge date: 04/12/22  Discharge Physician: Sharen Hones   PCP: Crissie Figures, PA-C   Recommendations at discharge:   Follow-up with PCP in 1 week.  Follow-up with Dr. Rockey Situ in 1 month. Follow-up with hematology in 2 months.  Discharge Diagnoses: Principal Problem:   Generalized weakness Active Problems:   Acute lower UTI   Chest pain   Essential hypertension   Depression   Acute on chronic anemia   Dyslipidemia   Chronic obstructive pulmonary disease (COPD) (HCC)   Type 2 diabetes mellitus without complications (HCC)   GERD without esophagitis   Other chest pain UTI ruled out. Uncontrolled type 2 diabetes with hyperglycemia. Resolved Problems:   * No resolved hospital problems. *  Hospital Course: MARYGRACE Hayes is a 76 y.o. African-American female with medical history significant for HFpEF, COPD, CAD, s/p CABG X3, depression, type diabetes mellitus, hypertension, GERD, dyslipidemia, PAD and seizure disorder, who presented to the ER with acute onset of intermittent chest pain with palpitations as well as progressive generalized weakness for the last 3 to 4 days.  Has abnormal urine, was started on Rocephin for UTI.  Patient also had significant anemia initial hemoglobin was 7.7, dropped down to 6.9, which reflects a significant drop of hemoglobin from baseline.  However patient does not have any active of black stool or rectal bleeding.  Received 1 unit PRBC.  Assessment and Plan: * Generalized weakness Chest pain Coronary artery disease. Acute on chronic anemia. Intermittent chest pain over the last 3 to 4 days.  She also experienced significant short of breath with exertion and weakness.  Her troponin has been negative. Reviewed patient prior labs, patient has a chronic anemia, with average hemoglobin level between 9 and 10.  Hemoglobin was 7.7 at  admission, dropped down to 6.9.  Chest pain and shortness of breath most likely due to worsening anemia.  Transfuse 1 unit PRBC. Based on prior labs on 01/2022, patient had a normal B12, borderline low ferritin level.  Will start oral iron treatment.  Patient be referred to hematologist by oncology after discharge. Discussed with cardiology, patient condition most likely due to worsening anemia.  Patient no longer have any chest pain after transfusion.  She was seen by PT/OT, she did not need nursing placement.  She also did not have any chest pain during exertion with PT/OT.  Medically stable to be discharged.  E. coli urinary colonization. Acute lower UTI ruled out. After several discussion with the patient, she really has no symptoms for UTI.  She received 2 doses of Rocephin, I would not continue antibiotics as her condition not consistent with UTI.     GERD without esophagitis We will continue Carafate.   Uncontrolled type 2 diabetes with hyperglycemia. Resume home regimen.  Follow-up with PCP to adjust dose of insulin.   Chronic obstructive pulmonary disease (COPD) (HCC) No bronchospasm.   Dyslipidemia  We will continue statin therapy.   Essential hypertension Continue home medicines..       Consultants: None Procedures performed: None  Disposition: Home health Diet recommendation:  Discharge Diet Orders (From admission, onward)     Start     Ordered   04/12/22 0000  Diet - low sodium heart healthy        04/12/22 1453           Cardiac diet DISCHARGE MEDICATION: Allergies as  of 04/12/2022       Reactions   Aspirin Anaphylaxis, Shortness Of Breath, Swelling   LIPS AND THROAT SWELL, DIFFICULT TO BREATH        Medication List     STOP taking these medications    ondansetron 4 MG tablet Commonly known as: ZOFRAN       TAKE these medications    acetaminophen 500 MG tablet Commonly known as: TYLENOL Take 500 mg by mouth every 6 (six) hours as  needed for headache, moderate pain or fever.   albuterol (2.5 MG/3ML) 0.083% nebulizer solution Commonly known as: PROVENTIL Take 2.5 mg every 4 (four) hours as needed by nebulization for wheezing or shortness of breath.   albuterol 108 (90 Base) MCG/ACT inhaler Commonly known as: VENTOLIN HFA Inhale 2 puffs into the lungs every 6 (six) hours as needed for wheezing or shortness of breath.   amitriptyline 25 MG tablet Commonly known as: ELAVIL Take 25 mg by mouth at bedtime.   amLODipine 5 MG tablet Commonly known as: NORVASC Take 1 tablet (5 mg total) by mouth daily.   atorvastatin 40 MG tablet Commonly known as: LIPITOR Take 1 tablet (40 mg total) by mouth daily.   B-D UF III MINI PEN NEEDLES 31G X 5 MM Misc Generic drug: Insulin Pen Needle USE WITH INSULIN PEN INJECTIONS TWICE DAILY   carvedilol 3.125 MG tablet Commonly known as: COREG Take 1 tablet (3.125 mg total) by mouth daily as needed. Please call to schedule an overdue appointment with Cardiology for refills, [phone], thank you. 1st attempt.   clopidogrel 75 MG tablet Commonly known as: PLAVIX Take 1 tablet (75 mg total) by mouth daily.   Dupixent 300 MG/2ML Sopn Generic drug: Dupilumab Inject into the skin.   HYDROcodone-acetaminophen 7.5-325 MG tablet Commonly known as: NORCO Take 1 tablet by mouth every 6 (six) hours as needed for moderate pain or severe pain. What changed: Another medication with the same name was removed. Continue taking this medication, and follow the directions you see here.   iron polysaccharides 150 MG capsule Commonly known as: NIFEREX Take 1 capsule (150 mg total) by mouth daily. Start taking on: April 13, 2022   isosorbide mononitrate 30 MG 24 hr tablet Commonly known as: IMDUR Take 1 tablet (30 mg total) by mouth daily.   Lantus SoloStar 100 UNIT/ML Solostar Pen Generic drug: insulin glargine Inject 30-40 Units into the skin See admin instructions. Inject 30 units  subcutaneously in the morning at 40 units subcutaneously at bedtime.   levETIRAcetam 750 MG tablet Commonly known as: KEPPRA Take 750 mg by mouth 2 (two) times daily.   losartan 100 MG tablet Commonly known as: COZAAR Take 1 tablet by mouth once daily   methocarbamol 750 MG tablet Commonly known as: ROBAXIN TAKE 1 TABLET BY MOUTH EVERY 8 HOURS AS NEEDED FOR MUSCLE SPASM   montelukast 10 MG tablet Commonly known as: SINGULAIR Take 10 mg by mouth daily.   nitroGLYCERIN 0.4 MG SL tablet Commonly known as: NITROSTAT DISSOLVE ONE TABLET UNDER THE TONGUE EVERY 5 MINUTES AS NEEDED FOR CHEST PAIN.  DO NOT EXCEED A TOTAL OF 3 DOSES IN 15 MINUTES   omeprazole 40 MG capsule Commonly known as: PRILOSEC Take 1 capsule (40 mg total) by mouth 2 (two) times daily before a meal.   potassium chloride SA 20 MEQ tablet Commonly known as: KLOR-CON M Take 2 tablets (40 mEq total) by mouth 2 (two) times daily. Take extra 2 tablets (40 meq)  if you take metolazone   sertraline 100 MG tablet Commonly known as: ZOLOFT Take 150 mg by mouth daily.   sucralfate 1 GM/10ML suspension Commonly known as: CARAFATE Take 10 mLs (1 g total) by mouth 4 (four) times daily.   tiotropium 18 MCG inhalation capsule Commonly known as: SPIRIVA Place 18 mcg into inhaler and inhale daily.   torsemide 20 MG tablet Commonly known as: DEMADEX Take 1 tablet (20 mg) by mouth twice daily   traZODone 50 MG tablet Commonly known as: DESYREL Take 50 mg by mouth at bedtime.        Follow-up Information     Crissie Figures, PA-C Follow up in 1 week(s).   Specialty: Physician Assistant Contact information: [place] [address] [phone]         Earlie Server, MD Follow up in 2 week(s).   Specialty: Oncology Contact information: [place] [address] [phone]                Discharge Exam: Danley Danker Weights   04/10/22 1443  Weight: 127 kg   General exam:  Appears calm and comfortable  Respiratory system: Clear to auscultation. Respiratory effort normal. Cardiovascular system: S1 & S2 heard, RRR. No JVD, murmurs, rubs, gallops or clicks. No pedal edema. Gastrointestinal system: Abdomen is nondistended, soft and nontender. No organomegaly or masses felt. Normal bowel sounds heard. Central nervous system: Alert and oriented. No focal neurological deficits. Extremities: Symmetric 5 x 5 power. Skin: No rashes, lesions or ulcers Psychiatry: Judgement and insight appear normal. Mood & affect appropriate.    Condition at discharge: good  The results of significant diagnostics from this hospitalization (including imaging, microbiology, ancillary and laboratory) are listed below for reference.   Imaging Studies: DG Chest 2 View  Result Date: 04/10/2022 CLINICAL DATA:  2-3 day history of central chest pain associated with shortness of breath and lightheadedness EXAM: CHEST - 2 VIEW COMPARISON:  Chest radiograph dated 08/09/2021 FINDINGS: Normal lung volume. Rounded retrocardiac density likely corresponds to known hiatal hernia. No focal consolidations. No pleural effusion or pneumothorax. Similar postoperative cardiomediastinal silhouette. Median sternotomy wires are nondisplaced. IMPRESSION: 1. No acute cardiopulmonary disease. 2. Hiatal hernia. Electronically Signed   By: Darrin Nipper M.D.   On: 04/10/2022 15:52    Microbiology: Results for orders placed or performed during the hospital encounter of 04/10/22  Resp panel by RT-PCR (RSV, Flu A&B, Covid) Anterior Nasal Swab     Status: None   Collection Time: 04/10/22  4:30 PM   Specimen: Anterior Nasal Swab  Result Value Ref Range Status   SARS Coronavirus 2 by RT PCR NEGATIVE NEGATIVE Final   Influenza A by PCR NEGATIVE NEGATIVE Final   Influenza B by PCR NEGATIVE NEGATIVE Final   Resp Syncytial Virus by PCR NEGATIVE NEGATIVE Final    Comment: Performed at [facility], [address], [place], [place] [address]  Urine Culture     Status: Abnormal (Preliminary result)   Collection Time: 04/10/22  7:35 PM   Specimen: Urine, Random  Result Value Ref Range Status   Specimen Description   Final    URINE, RANDOM Performed at [facility], [address] [place], [place] [address]    Special Requests   Final    NONE Performed at [facility], [address] [place], [place] [address]    Culture (A)  Final    >=100,000 COLONIES/mL ESCHERICHIA COLI SUSCEPTIBILITIES TO FOLLOW Performed at  [facility], [place] [address] [place], [place] [address]    Report Status PENDING  Incomplete    Labs: CBC: Recent Labs  Lab 04/10/22 1445 04/11/22 0419 04/12/22 0340  WBC 5.7 5.3 6.7  HGB 7.7* 6.9* 8.7*  HCT 27.4* 24.4* 29.7*  MCV 75.7* 75.1* 73.7*  PLT 306 264 735   Basic Metabolic Panel: Recent Labs  Lab 04/10/22 1445 04/11/22 0419  NA 139 140  K 3.8 3.6  CL 112* 109  CO2 23 25  GLUCOSE 136* 175*  BUN 13 11  CREATININE 0.78 0.84  CALCIUM 8.7* 8.5*   Liver Function Tests: No results for input(s): "AST", "ALT", "ALKPHOS", "BILITOT", "PROT", "ALBUMIN" in the last 168 hours. CBG: Recent Labs  Lab 04/11/22 1011 04/11/22 2214  GLUCAP 135* 218*    Discharge time spent: greater than 30 minutes.  Signed: Sharen Hones, MD [facility] 04/12/2022

## 2022-04-13 LAB — URINE CULTURE: Culture: >=100000 — AB

## 2022-06-02 ENCOUNTER — Ambulatory Visit: Payer: Medicare HMO | Attending: Anesthesiology | Admitting: Anesthesiology

## 2022-06-02 DIAGNOSIS — G894 Chronic pain syndrome: Secondary | ICD-10-CM | POA: Diagnosis present

## 2022-06-02 DIAGNOSIS — F1190 Opioid use, unspecified, uncomplicated: Secondary | ICD-10-CM

## 2022-06-02 DIAGNOSIS — M5136 Other intervertebral disc degeneration, lumbar region: Secondary | ICD-10-CM | POA: Diagnosis present

## 2022-06-02 DIAGNOSIS — M4716 Other spondylosis with myelopathy, lumbar region: Secondary | ICD-10-CM

## 2022-06-02 DIAGNOSIS — M47817 Spondylosis without myelopathy or radiculopathy, lumbosacral region: Secondary | ICD-10-CM

## 2022-06-02 DIAGNOSIS — G4486 Cervicogenic headache: Secondary | ICD-10-CM | POA: Diagnosis present

## 2022-06-02 DIAGNOSIS — M48062 Spinal stenosis, lumbar region with neurogenic claudication: Secondary | ICD-10-CM

## 2022-06-02 DIAGNOSIS — M51369 Other intervertebral disc degeneration, lumbar region without mention of lumbar back pain or lower extremity pain: Secondary | ICD-10-CM

## 2022-06-02 DIAGNOSIS — M5432 Sciatica, left side: Secondary | ICD-10-CM

## 2022-06-02 DIAGNOSIS — M5431 Sciatica, right side: Secondary | ICD-10-CM | POA: Diagnosis not present

## 2022-06-02 MED ORDER — HYDROCODONE-ACETAMINOPHEN 7.5-325 MG PO TABS: 1.0000 | ORAL_TABLET | Freq: Four times a day (QID) | ORAL | 0 refills | Status: DC | PRN

## 2022-06-02 MED ORDER — HYDROCODONE-ACETAMINOPHEN 7.5-325 MG PO TABS: 1.0000 | ORAL_TABLET | Freq: Four times a day (QID) | ORAL | 0 refills | Status: AC | PRN

## 2022-06-03 ENCOUNTER — Telehealth: Payer: Self-pay

## 2022-06-03 NOTE — Progress Notes (Signed)
Virtual Visit via Telephone Note  I connected with Tina Hayes on 06/03/22 at  2:30 PM EST by telephone and verified that I am speaking with the correct person using two identifiers.  Location: Patient: Home Provider: Pain control center   I discussed the limitations, risks, security and privacy concerns of performing an evaluation and management service by telephone and the availability of in person appointments. I also discussed with the patient that there may be a patient responsible charge related to this service. The patient expressed understanding and agreed to proceed.   History of Present Illness: I spoke with Tina Hayes via telephone as we were unable link for the video portion of the conference call.  She reports that she is having considerable amount of low back pain with hip pain buttock pain and some posterior lateral leg pain.  She has had this in the past and has had previous epidural steroid injections to assist with this.  She is requesting a repeat injection as soon as possible.  She does take Plavix and will need to discontinue that.  She generally gets 75 to 80% improvement in her low back pain and nearly complete relief of her sciatica symptoms lasting about 2 to 3 months.  Her last epidural injection was back in February and September of last year.  She also takes chronic opioid management medications that help but they are more effective following the epidural for the next 3 to 5 months and despite the medicines she continues to have severe pain at this point.  Otherwise she is in her usual state of health and no new changes in lower extremity strength function bowel or bladder function.  Review of systems: General: No fevers or chills Pulmonary: No shortness of breath or dyspnea Cardiac: No angina or palpitations or lightheadedness GI: No abdominal pain or constipation Psych: No depression    Observations/Objective:  Current Outpatient Medications:     HYDROcodone-acetaminophen (NORCO) 7.5-325 MG tablet, Take 1 tablet by mouth every 6 (six) hours as needed for moderate pain or severe pain., Disp: 120 tablet, Rfl: 0   [START ON 07/02/2022] HYDROcodone-acetaminophen (NORCO) 7.5-325 MG tablet, Take 1 tablet by mouth every 6 (six) hours as needed for moderate pain or severe pain., Disp: 120 tablet, Rfl: 0   acetaminophen (TYLENOL) 500 MG tablet, Take 500 mg by mouth every 6 (six) hours as needed for headache, moderate pain or fever., Disp: , Rfl:    albuterol (PROVENTIL HFA;VENTOLIN HFA) 108 (90 Base) MCG/ACT inhaler, Inhale 2 puffs into the lungs every 6 (six) hours as needed for wheezing or shortness of breath., Disp: 1 Inhaler, Rfl: 2   albuterol (PROVENTIL) (2.5 MG/3ML) 0.083% nebulizer solution, Take 2.5 mg every 4 (four) hours as needed by nebulization for wheezing or shortness of breath., Disp: , Rfl:    amitriptyline (ELAVIL) 25 MG tablet, Take 25 mg by mouth at bedtime., Disp: , Rfl:    amLODipine (NORVASC) 5 MG tablet, Take 1 tablet (5 mg total) by mouth daily., Disp: 90 tablet, Rfl: 3   atorvastatin (LIPITOR) 40 MG tablet, Take 1 tablet (40 mg total) by mouth daily., Disp: 90 tablet, Rfl: 3   B-D UF III MINI PEN NEEDLES 31G X 5 MM MISC, USE WITH INSULIN PEN INJECTIONS TWICE DAILY, Disp: , Rfl:    carvedilol (COREG) 3.125 MG tablet, Take 1 tablet (3.125 mg total) by mouth daily as needed. Please call to schedule an overdue appointment with Cardiology for refills, [phone], thank you. 1st  attempt., Disp: 180 tablet, Rfl: 3   clopidogrel (PLAVIX) 75 MG tablet, Take 1 tablet (75 mg total) by mouth daily., Disp: 90 tablet, Rfl: 3   DUPIXENT 300 MG/2ML SOPN, Inject into the skin., Disp: , Rfl:    Hayes polysaccharides (NIFEREX) 150 MG capsule, Take 1 capsule (150 mg total) by mouth daily., Disp: 30 capsule, Rfl: 0   isosorbide mononitrate (IMDUR) 30 MG 24 hr tablet, Take 1 tablet (30 mg total) by mouth daily., Disp: 180 tablet, Rfl: 3   LANTUS  SOLOSTAR 100 UNIT/ML Solostar Pen, Inject 30-40 Units into the skin See admin instructions. Inject 30 units subcutaneously in the morning at 40 units subcutaneously at bedtime., Disp: , Rfl:    levETIRAcetam (KEPPRA) 750 MG tablet, Take 750 mg by mouth 2 (two) times daily., Disp: , Rfl:    losartan (COZAAR) 100 MG tablet, Take 1 tablet by mouth once daily, Disp: 90 tablet, Rfl: 3   methocarbamol (ROBAXIN) 750 MG tablet, TAKE 1 TABLET BY MOUTH EVERY 8 HOURS AS NEEDED FOR MUSCLE SPASM, Disp: 90 tablet, Rfl: 0   montelukast (SINGULAIR) 10 MG tablet, Take 10 mg by mouth daily., Disp: , Rfl:    nitroGLYCERIN (NITROSTAT) 0.4 MG SL tablet, DISSOLVE ONE TABLET UNDER THE TONGUE EVERY 5 MINUTES AS NEEDED FOR CHEST PAIN.  DO NOT EXCEED A TOTAL OF 3 DOSES IN 15 MINUTES (Patient not taking: Reported on 04/10/2022), Disp: 25 tablet, Rfl: 0   omeprazole (PRILOSEC) 40 MG capsule, Take 1 capsule (40 mg total) by mouth 2 (two) times daily before a meal., Disp: 180 capsule, Rfl: 3   potassium chloride SA (KLOR-CON) 20 MEQ tablet, Take 2 tablets (40 mEq total) by mouth 2 (two) times daily. Take extra 2 tablets (40 meq) if you take metolazone, Disp: 140 tablet, Rfl: 5   sertraline (ZOLOFT) 100 MG tablet, Take 150 mg by mouth daily., Disp: , Rfl:    sucralfate (CARAFATE) 1 GM/10ML suspension, Take 10 mLs (1 g total) by mouth 4 (four) times daily., Disp: 1200 mL, Rfl: 3   tiotropium (SPIRIVA) 18 MCG inhalation capsule, Place 18 mcg into inhaler and inhale daily., Disp: , Rfl:    torsemide (DEMADEX) 20 MG tablet, Take 1 tablet (20 mg) by mouth twice daily, Disp: , Rfl:    traZODone (DESYREL) 50 MG tablet, Take 50 mg by mouth at bedtime., Disp: , Rfl:    Past Medical History:  Diagnosis Date   (HFpEF) heart failure with preserved ejection fraction ([place])    a. 05/2016 Echo: EF 60-65%, mild to mod LVH, Gr1 DD, mild MR, mildly dil LA, mod TR, mildly to mod increased PASP; b. 07/2020 Echo: EF 55-60%, no rwma, mild LVH, GrIDD,  mildly dil LA, mild MR, mild-mod TR, mild-mod AoV sclerosis w/o stenosis.   Anxiety    Arthritis    Chronic back pain    COPD (chronic obstructive pulmonary disease) (HCC)    Coronary artery disease    a. s/p remote PCI x 5;  b. 2006 s/p CABG x 3 ([place]); b. 05/2016 MV: attenuation corrected images w/o ischemia or wma-->Med rx; c. 06/2020 MV: EF 55-65%, no isch/infart-->low risk.   Depression    Diabetes mellitus without complication ([place])    Essential hypertension 06/30/2016   GERD (gastroesophageal reflux disease)    Heart attack ([place])    Total of 3 per pt.   Hyperlipidemia LDL goal <55    Hypertensive urgency 06/03/2015   PAD (peripheral artery  disease) ([place])    a. 06/2019 Angio: R AT (PTA), R POP (PTA & Viabahn stenting); b. 09/2020 ABIs: R 1.24, L 1.35.   Palpitations    a. 06/2020 Zio: Sinus brady, 54 (40-143), 4 beats NSVT, 3 SVT runs (max 143 bpm x 4 beats).   Seizure ([place])      Assessment and Plan: 1. Bilateral sciatica   2. Lumbar spondylosis with myelopathy   3. DDD (degenerative disc disease), lumbar   4. Spinal stenosis of lumbar region with neurogenic claudication   5. Chronic, continuous use of opioids   6. Chronic pain syndrome   7. Facet arthritis of lumbosacral region   8. Morbid obesity ([place])   9. Cervicogenic headache   Based on our conversation today I think is appropriate to schedule her for an upcoming epidural steroid injection.  She has done very well with these in the past got good relief and generally goes 3 to 4 months before she has recurrence of similar symptoms.  She has a nonsurgical candidate and despite chronic opioid maintenance therapy and other conservative measures including physical therapy this pain is persistent.  It is keeping her from being active and she feels she is gaining weight additionally because of that.  Otherwise she is in her usual state of health.  I have reviewed the [facility] practitioner  database information is appropriate for refill and this will be for the next 2 months.  Will schedule her for the next available date to proceed with an epidural and she is to discontinue her Plavix 7 to 10 days in advance.  She has tolerated that well as well.  Continue follow-up with her primary care physicians for baseline medical care.  Follow Up Instructions:    I discussed the assessment and treatment plan with the patient. The patient was provided an opportunity to ask questions and all were answered. The patient agreed with the plan and demonstrated an understanding of the instructions.   The patient was advised to call back or seek an in-person evaluation if the symptoms worsen or if the condition fails to improve as anticipated.  I provided 30 minutes of non-face-to-face time during this encounter.   Molli Barrows, MD

## 2022-06-03 NOTE — Telephone Encounter (Signed)
I sent an inbox to her cardiologist just to be sure.

## 2022-06-03 NOTE — Telephone Encounter (Signed)
Dr Andree Elk would like clearance for patient to stop Plavix for 7 days for a Lumbar Epidural Steroid injection. Thank you

## 2022-06-03 NOTE — Telephone Encounter (Signed)
I have the authorization for the procedure. Do you have to get clearance for her to stop her blood thinners for 7 days?

## 2022-06-03 NOTE — Telephone Encounter (Signed)
Dr Andree Elk would like clearance for th is patient to stop Plavix for 7 days prior to having a Lumbar Epidural Steroid injection.  Thank you

## 2022-06-09 ENCOUNTER — Ambulatory Visit: Payer: Medicare HMO | Admitting: Gastroenterology

## 2022-06-10 NOTE — Telephone Encounter (Signed)
Informed Dr Andree Elk and this message was forwarded to him.

## 2022-06-17 NOTE — Telephone Encounter (Signed)
Patient notified of above. Patient states she is aware of the risks and chooses to stop her blood thinner.

## 2022-06-23 ENCOUNTER — Ambulatory Visit: Payer: Medicare HMO | Admitting: Anesthesiology

## 2022-06-26 ENCOUNTER — Encounter: Payer: Self-pay | Admitting: Anesthesiology

## 2022-06-26 ENCOUNTER — Ambulatory Visit (HOSPITAL_BASED_OUTPATIENT_CLINIC_OR_DEPARTMENT_OTHER): Payer: Medicare HMO | Admitting: Anesthesiology

## 2022-06-26 ENCOUNTER — Ambulatory Visit
Admission: RE | Admit: 2022-06-26 | Discharge: 2022-06-26 | Disposition: A | Discharge status: Home / Self Care | Payer: Medicare HMO | Source: Ambulatory Visit | Attending: Anesthesiology | Admitting: Anesthesiology

## 2022-06-26 ENCOUNTER — Other Ambulatory Visit: Payer: Self-pay | Admitting: Anesthesiology

## 2022-06-26 VITALS — BP 134/71 | HR 63 | Resp 15 | Ht 71.0 in | Wt 260.0 lb

## 2022-06-26 DIAGNOSIS — M47817 Spondylosis without myelopathy or radiculopathy, lumbosacral region: Secondary | ICD-10-CM | POA: Diagnosis present

## 2022-06-26 DIAGNOSIS — M48062 Spinal stenosis, lumbar region with neurogenic claudication: Secondary | ICD-10-CM

## 2022-06-26 DIAGNOSIS — G894 Chronic pain syndrome: Secondary | ICD-10-CM | POA: Insufficient documentation

## 2022-06-26 DIAGNOSIS — M5431 Sciatica, right side: Secondary | ICD-10-CM

## 2022-06-26 DIAGNOSIS — R52 Pain, unspecified: Secondary | ICD-10-CM

## 2022-06-26 DIAGNOSIS — F1190 Opioid use, unspecified, uncomplicated: Secondary | ICD-10-CM | POA: Insufficient documentation

## 2022-06-26 DIAGNOSIS — M5136 Other intervertebral disc degeneration, lumbar region: Secondary | ICD-10-CM | POA: Insufficient documentation

## 2022-06-26 DIAGNOSIS — G4486 Cervicogenic headache: Secondary | ICD-10-CM | POA: Diagnosis present

## 2022-06-26 DIAGNOSIS — M4716 Other spondylosis with myelopathy, lumbar region: Secondary | ICD-10-CM | POA: Diagnosis present

## 2022-06-26 DIAGNOSIS — M5432 Sciatica, left side: Secondary | ICD-10-CM | POA: Insufficient documentation

## 2022-06-26 MED ORDER — LACTATED RINGERS IV SOLN: INTRAVENOUS | Status: DC

## 2022-06-26 MED ORDER — LIDOCAINE HCL 2 % IJ SOLN
INTRAMUSCULAR | Status: AC
  Filled 2022-06-26: qty 20

## 2022-06-26 MED ORDER — ROPIVACAINE HCL 2 MG/ML IJ SOLN
10.0000 mL | Freq: Once | INTRAMUSCULAR | Status: AC
  Administered 2022-06-26: 10 mL via EPIDURAL

## 2022-06-26 MED ORDER — ROPIVACAINE HCL 2 MG/ML IJ SOLN
INTRAMUSCULAR | Status: AC
  Filled 2022-06-26: qty 20

## 2022-06-26 MED ORDER — SODIUM CHLORIDE 0.9% FLUSH
10.0000 mL | Freq: Once | INTRAVENOUS | Status: AC
  Administered 2022-06-26: 10 mL

## 2022-06-26 MED ORDER — LIDOCAINE HCL (PF) 1 % IJ SOLN
5.0000 mL | Freq: Once | INTRAMUSCULAR | Status: AC
  Administered 2022-06-26: 5 mL via SUBCUTANEOUS

## 2022-06-26 MED ORDER — IOHEXOL 180 MG/ML  SOLN
INTRAMUSCULAR | Status: AC
  Filled 2022-06-26: qty 20

## 2022-06-26 MED ORDER — SODIUM CHLORIDE (PF) 0.9 % IJ SOLN
INTRAMUSCULAR | Status: AC
  Filled 2022-06-26: qty 10

## 2022-06-26 MED ORDER — TRIAMCINOLONE ACETONIDE 40 MG/ML IJ SUSP
40.0000 mg | Freq: Once | INTRAMUSCULAR | Status: AC
  Administered 2022-06-26: 40 mg

## 2022-06-26 MED ORDER — MIDAZOLAM HCL 2 MG/2ML IJ SOLN
2.0000 mg | Freq: Once | INTRAMUSCULAR | Status: AC
  Administered 2022-06-26: 2 mg via INTRAVENOUS

## 2022-06-26 MED ORDER — IOHEXOL 180 MG/ML  SOLN
10.0000 mL | Freq: Once | INTRAMUSCULAR | Status: AC | PRN
  Administered 2022-06-26: 10 mL via EPIDURAL

## 2022-06-26 MED ORDER — TRIAMCINOLONE ACETONIDE 40 MG/ML IJ SUSP
INTRAMUSCULAR | Status: AC
  Filled 2022-06-26: qty 1

## 2022-06-26 MED ORDER — MIDAZOLAM HCL 2 MG/2ML IJ SOLN
INTRAMUSCULAR | Status: AC
  Filled 2022-06-26: qty 2

## 2022-06-26 NOTE — Patient Instructions (Signed)

## 2022-06-26 NOTE — Progress Notes (Signed)
Safety precautions to be maintained throughout the outpatient stay will include: orient to surroundings, keep bed in low position, maintain call bell within reach at all times, provide assistance with transfer out of bed and ambulation.   Patient did not bring medications with her today.

## 2022-06-27 ENCOUNTER — Encounter: Payer: Self-pay | Admitting: Anesthesiology

## 2022-06-27 NOTE — Progress Notes (Signed)
Subjective:  Patient ID: [name], female    DOB: 05-03-1945  Age: 77 y.o. MRN: [id number]  CC: Back Pain (lower)   Procedure: L5-S1 epidural steroid under fluoroscopic guidance with minimal sedation  HPI CURTRINA SUH presents for reevaluation.  Faria continues to have a lot of low back pain with associated sciatica symptoms.  The pain is an aching gnawing pain worse with any type of activity or significant movement and is also present when she is seated and in her wheelchair or trying to use a walker.  This pain starts in the low back radiates into the hips goes down the buttocks and down the posterior lateral legs with associated numbness and tingling in the feet.  She has had previous epidural injections in the past and these have worked well for her.  She generally gets about 75% reduction in low back pain and 80% reduction in her lower extremity pain as described today.  She has been off of her Plavix in advance of the procedure.  Previous epidurals were back in February and September of last year and she responds favorably to these.  She has failed more conservative therapy.  She is try to do physical therapy and conservative care without success.  She continues to take chronic opioids effectively without difficulty.  Outpatient Medications Prior to Visit  Medication Sig Dispense Refill   acetaminophen (TYLENOL) 500 MG tablet Take 500 mg by mouth every 6 (six) hours as needed for headache, moderate pain or fever.     albuterol (PROVENTIL HFA;VENTOLIN HFA) 108 (90 Base) MCG/ACT inhaler Inhale 2 puffs into the lungs every 6 (six) hours as needed for wheezing or shortness of breath. 1 Inhaler 2   albuterol (PROVENTIL) (2.5 MG/3ML) 0.083% nebulizer solution Take 2.5 mg every 4 (four) hours as needed by nebulization for wheezing or shortness of breath.     amitriptyline (ELAVIL) 25 MG tablet Take 25 mg by mouth at bedtime.     amLODipine (NORVASC) 5 MG tablet Take 1 tablet (5 mg total) by  mouth daily. 90 tablet 3   atorvastatin (LIPITOR) 40 MG tablet Take 1 tablet (40 mg total) by mouth daily. 90 tablet 3   B-D UF III MINI PEN NEEDLES 31G X 5 MM MISC USE WITH INSULIN PEN INJECTIONS TWICE DAILY     carvedilol (COREG) 3.125 MG tablet Take 1 tablet (3.125 mg total) by mouth daily as needed. Please call to schedule an overdue appointment with Cardiology for refills, [phone], thank you. 1st attempt. 180 tablet 3   clopidogrel (PLAVIX) 75 MG tablet Take 1 tablet (75 mg total) by mouth daily. 90 tablet 3   DUPIXENT 300 MG/2ML SOPN Inject into the skin.     HYDROcodone-acetaminophen (NORCO) 7.5-325 MG tablet Take 1 tablet by mouth every 6 (six) hours as needed for moderate pain or severe pain. 120 tablet 0   [START ON 07/02/2022] HYDROcodone-acetaminophen (NORCO) 7.5-325 MG tablet Take 1 tablet by mouth every 6 (six) hours as needed for moderate pain or severe pain. 120 tablet 0   iron polysaccharides (NIFEREX) 150 MG capsule Take 1 capsule (150 mg total) by mouth daily. 30 capsule 0   isosorbide mononitrate (IMDUR) 30 MG 24 hr tablet Take 1 tablet (30 mg total) by mouth daily. 180 tablet 3   LANTUS SOLOSTAR 100 UNIT/ML Solostar Pen Inject 30-40 Units into the skin See admin instructions. Inject 30 units subcutaneously in the morning at 40 units subcutaneously at bedtime.  levETIRAcetam (KEPPRA) 750 MG tablet Take 750 mg by mouth 2 (two) times daily.     losartan (COZAAR) 100 MG tablet Take 1 tablet by mouth once daily 90 tablet 3   methocarbamol (ROBAXIN) 750 MG tablet TAKE 1 TABLET BY MOUTH EVERY 8 HOURS AS NEEDED FOR MUSCLE SPASM 90 tablet 0   montelukast (SINGULAIR) 10 MG tablet Take 10 mg by mouth daily.     omeprazole (PRILOSEC) 40 MG capsule Take 1 capsule (40 mg total) by mouth 2 (two) times daily before a meal. 180 capsule 3   potassium chloride SA (KLOR-CON) 20 MEQ tablet Take 2 tablets (40 mEq total) by mouth 2 (two) times daily. Take extra 2 tablets (40 meq) if you take  metolazone 140 tablet 5   sertraline (ZOLOFT) 100 MG tablet Take 150 mg by mouth daily.     sucralfate (CARAFATE) 1 GM/10ML suspension Take 10 mLs (1 g total) by mouth 4 (four) times daily. 1200 mL 3   tiotropium (SPIRIVA) 18 MCG inhalation capsule Place 18 mcg into inhaler and inhale daily.     torsemide (DEMADEX) 20 MG tablet Take 1 tablet (20 mg) by mouth twice daily     traZODone (DESYREL) 50 MG tablet Take 50 mg by mouth at bedtime.     nitroGLYCERIN (NITROSTAT) 0.4 MG SL tablet DISSOLVE ONE TABLET UNDER THE TONGUE EVERY 5 MINUTES AS NEEDED FOR CHEST PAIN.  DO NOT EXCEED A TOTAL OF 3 DOSES IN 15 MINUTES (Patient not taking: Reported on 04/10/2022) 25 tablet 0   No facility-administered medications prior to visit.    Review of Systems CNS: No confusion or sedation Cardiac: No angina or palpitations GI: No abdominal pain or constipation Constitutional: No nausea vomiting fevers or chills  Objective:  BP 134/71   Pulse 63   Resp 15   Ht '5\' 11"'$  (1.803 m)   Wt 260 lb (117.9 kg)   SpO2 98%   BMI 36.26 kg/m    BP Readings from Last 3 Encounters:  06/26/22 134/71  04/12/22 114/67  02/05/22 (!) 182/92     Wt Readings from Last 3 Encounters:  06/26/22 260 lb (117.9 kg)  04/10/22 280 lb (127 kg)  02/05/22 278 lb 8 oz (126.3 kg)     Physical Exam Pt is alert and oriented PERRL EOMI HEART IS RRR no murmur or rub LCTA no wheezing or rales MUSCULOSKELETAL reveals some paraspinous muscle tenderness but no overt trigger points.  Muscle tone and bulk is at baseline.  She has difficulty ambulating and is in a wheelchair upon evaluation today.  Labs  Lab Results  Component Value Date   HGBA1C 7.7 (H) 02/24/2021   HGBA1C 6.5 (H) 01/18/2019   HGBA1C 7.5 (H) 05/27/2018   Lab Results  Component Value Date   LDLCALC 58 01/30/2017   CREATININE 0.84 04/11/2022     -------------------------------------------------------------------------------------------------------------------- Lab Results  Component Value Date   WBC 6.7 04/12/2022   HGB 8.7 (L) 04/12/2022   HCT 29.7 (L) 04/12/2022   PLT 318 04/12/2022   GLUCOSE 175 (H) 04/11/2022   CHOL 120 01/30/2017   TRIG 73 01/30/2017   HDL 47 01/30/2017   LDLCALC 58 01/30/2017   ALT 15 08/09/2021   AST 22 08/09/2021   NA 140 04/11/2022   K 3.6 04/11/2022   CL 109 04/11/2022   CREATININE 0.84 04/11/2022   BUN 11 04/11/2022   CO2 25 04/11/2022   TSH 2.640 07/13/2020   INR 1.0 08/09/2021   HGBA1C 7.7 (  H) 02/24/2021    --------------------------------------------------------------------------------------------------------------------- DG PAIN CLINIC C-ARM 1-60 MIN NO REPORT  Result Date: 06/26/2022 Fluoro was used, but no Radiologist interpretation will be provided. Please refer to "NOTES" tab for provider progress note.    Assessment & Plan:   Lakeysha was seen today for back pain.  Diagnoses and all orders for this visit:  Chronic, continuous use of opioids -     ToxASSURE Select 13 (MW), Urine  Bilateral sciatica -     Lumbar Epidural Injection  Lumbar spondylosis with myelopathy -     Lumbar Epidural Injection  DDD (degenerative disc disease), lumbar -     Lumbar Epidural Injection  Spinal stenosis of lumbar region with neurogenic claudication -     Lumbar Epidural Injection  Chronic pain syndrome -     ToxASSURE Select 13 (MW), Urine  Facet arthritis of lumbosacral region  Morbid obesity (HCC)  Cervicogenic headache  Other orders -     triamcinolone acetonide (KENALOG-40) injection 40 mg -     sodium chloride flush (NS) 0.9 % injection 10 mL -     ropivacaine (PF) 2 mg/mL (0.2%) (NAROPIN) injection 10 mL -     midazolam (VERSED) injection 2 mg -     lidocaine (PF) (XYLOCAINE) 1 % injection 5 mL -     lactated ringers infusion -     iohexol (OMNIPAQUE) 180 MG/ML  injection 10 mL        ----------------------------------------------------------------------------------------------------------------------  Problem List Items Addressed This Visit       Unprioritized   Headache   Morbid obesity (HCC)   Other Visit Diagnoses     Chronic, continuous use of opioids    -  Primary   Relevant Orders   ToxASSURE Select 13 (MW), Urine   Bilateral sciatica       Relevant Medications   midazolam (VERSED) injection 2 mg (Completed)   Lumbar spondylosis with myelopathy       Relevant Medications   triamcinolone acetonide (KENALOG-40) injection 40 mg (Completed)   DDD (degenerative disc disease), lumbar       Relevant Medications   triamcinolone acetonide (KENALOG-40) injection 40 mg (Completed)   Spinal stenosis of lumbar region with neurogenic claudication       Relevant Medications   triamcinolone acetonide (KENALOG-40) injection 40 mg (Completed)   ropivacaine (PF) 2 mg/mL (0.2%) (NAROPIN) injection 10 mL (Completed)   lidocaine (PF) (XYLOCAINE) 1 % injection 5 mL (Completed)   Chronic pain syndrome       Relevant Medications   triamcinolone acetonide (KENALOG-40) injection 40 mg (Completed)   ropivacaine (PF) 2 mg/mL (0.2%) (NAROPIN) injection 10 mL (Completed)   lidocaine (PF) (XYLOCAINE) 1 % injection 5 mL (Completed)   Other Relevant Orders   ToxASSURE Select 13 (MW), Urine   Facet arthritis of lumbosacral region       Relevant Medications   triamcinolone acetonide (KENALOG-40) injection 40 mg (Completed)         ----------------------------------------------------------------------------------------------------------------------  1. Bilateral sciatica We will proceed with a repeat epidural today.  The risks and benefits of been reviewed all questions answered.  Continue efforts at weight loss which has been challenging for her.  Continue core stretching strengthening as tolerated.  Will schedule her for return to clinic in 1  month. - Lumbar Epidural Injection  2. Lumbar spondylosis with myelopathy As above - Lumbar Epidural Injection  3. DDD (degenerative disc disease), lumbar As above - Lumbar Epidural Injection  4. Spinal stenosis of  lumbar region with neurogenic claudication As above - Lumbar Epidural Injection  5. Chronic, continuous use of opioids Continue current medication management.  Refills will be initiated from March 6 in April 5 with the current medication management.  I have reviewed the [facility] practitioner database information is appropriate for refill. - ToxASSURE Select 13 (MW), Urine  6. Chronic pain syndrome As above - ToxASSURE Select 13 (MW), Urine  7. Facet arthritis of lumbosacral region   8. Morbid obesity ([place]) Continue efforts at weight loss and continue follow-up with her primary care physicians for her general medical car  9. Cervicogenic headache     ----------------------------------------------------------------------------------------------------------------------  I am having Velora Heckler. Feltz maintain her tiotropium, traZODone, albuterol, albuterol, levETIRAcetam, B-D UF III MINI PEN NEEDLES, clopidogrel, Lantus SoloStar, potassium chloride SA, torsemide, acetaminophen, isosorbide mononitrate, methocarbamol, amLODipine, Dupixent, sertraline, amitriptyline, montelukast, atorvastatin, carvedilol, nitroGLYCERIN, omeprazole, sucralfate, losartan, iron polysaccharides, HYDROcodone-acetaminophen, and HYDROcodone-acetaminophen. We administered triamcinolone acetonide, sodium chloride flush, ropivacaine (PF) 2 mg/mL (0.2%), midazolam, lidocaine (PF), lactated ringers, and iohexol.   Meds ordered this encounter  Medications   triamcinolone acetonide (KENALOG-40) injection 40 mg   sodium chloride flush (NS) 0.9 % injection 10 mL   ropivacaine (PF) 2 mg/mL (0.2%) (NAROPIN) injection 10 mL   midazolam (VERSED) injection 2 mg   lidocaine (PF) (XYLOCAINE) 1 %  injection 5 mL   lactated ringers infusion   iohexol (OMNIPAQUE) 180 MG/ML injection 10 mL   Patient's Medications  New Prescriptions   No medications on file  Previous Medications   ACETAMINOPHEN (TYLENOL) 500 MG TABLET    Take 500 mg by mouth every 6 (six) hours as needed for headache, moderate pain or fever.   ALBUTEROL (PROVENTIL HFA;VENTOLIN HFA) 108 (90 BASE) MCG/ACT INHALER    Inhale 2 puffs into the lungs every 6 (six) hours as needed for wheezing or shortness of breath.   ALBUTEROL (PROVENTIL) (2.5 MG/3ML) 0.083% NEBULIZER SOLUTION    Take 2.5 mg every 4 (four) hours as needed by nebulization for wheezing or shortness of breath.   AMITRIPTYLINE (ELAVIL) 25 MG TABLET    Take 25 mg by mouth at bedtime.   AMLODIPINE (NORVASC) 5 MG TABLET    Take 1 tablet (5 mg total) by mouth daily.   ATORVASTATIN (LIPITOR) 40 MG TABLET    Take 1 tablet (40 mg total) by mouth daily.   B-D UF III MINI PEN NEEDLES 31G X 5 MM MISC    USE WITH INSULIN PEN INJECTIONS TWICE DAILY   CARVEDILOL (COREG) 3.125 MG TABLET    Take 1 tablet (3.125 mg total) by mouth daily as needed. Please call to schedule an overdue appointment with Cardiology for refills, [phone], thank you. 1st attempt.   CLOPIDOGREL (PLAVIX) 75 MG TABLET    Take 1 tablet (75 mg total) by mouth daily.   DUPIXENT 300 MG/2ML SOPN    Inject into the skin.   HYDROCODONE-ACETAMINOPHEN (NORCO) 7.5-325 MG TABLET    Take 1 tablet by mouth every 6 (six) hours as needed for moderate pain or severe pain.   HYDROCODONE-ACETAMINOPHEN (NORCO) 7.5-325 MG TABLET    Take 1 tablet by mouth every 6 (six) hours as needed for moderate pain or severe pain.   IRON POLYSACCHARIDES (NIFEREX) 150 MG CAPSULE    Take 1 capsule (150 mg total) by mouth daily.   ISOSORBIDE MONONITRATE (IMDUR) 30 MG 24 HR TABLET    Take 1 tablet (30 mg total) by mouth daily.   LANTUS SOLOSTAR 100 UNIT/ML  SOLOSTAR PEN    Inject 30-40 Units into the skin See admin instructions. Inject 30 units  subcutaneously in the morning at 40 units subcutaneously at bedtime.   LEVETIRACETAM (KEPPRA) 750 MG TABLET    Take 750 mg by mouth 2 (two) times daily.   LOSARTAN (COZAAR) 100 MG TABLET    Take 1 tablet by mouth once daily   METHOCARBAMOL (ROBAXIN) 750 MG TABLET    TAKE 1 TABLET BY MOUTH EVERY 8 HOURS AS NEEDED FOR MUSCLE SPASM   MONTELUKAST (SINGULAIR) 10 MG TABLET    Take 10 mg by mouth daily.   NITROGLYCERIN (NITROSTAT) 0.4 MG SL TABLET    DISSOLVE ONE TABLET UNDER THE TONGUE EVERY 5 MINUTES AS NEEDED FOR CHEST PAIN.  DO NOT EXCEED A TOTAL OF 3 DOSES IN 15 MINUTES   OMEPRAZOLE (PRILOSEC) 40 MG CAPSULE    Take 1 capsule (40 mg total) by mouth 2 (two) times daily before a meal.   POTASSIUM CHLORIDE SA (KLOR-CON) 20 MEQ TABLET    Take 2 tablets (40 mEq total) by mouth 2 (two) times daily. Take extra 2 tablets (40 meq) if you take metolazone   SERTRALINE (ZOLOFT) 100 MG TABLET    Take 150 mg by mouth daily.   SUCRALFATE (CARAFATE) 1 GM/10ML SUSPENSION    Take 10 mLs (1 g total) by mouth 4 (four) times daily.   TIOTROPIUM (SPIRIVA) 18 MCG INHALATION CAPSULE    Place 18 mcg into inhaler and inhale daily.   TORSEMIDE (DEMADEX) 20 MG TABLET    Take 1 tablet (20 mg) by mouth twice daily   TRAZODONE (DESYREL) 50 MG TABLET    Take 50 mg by mouth at bedtime.  Modified Medications   No medications on file  Discontinued Medications   No medications on file   ----------------------------------------------------------------------------------------------------------------------  Follow-up: Return in about 1 month (around 07/25/2022) for evaluation, med refill.   Procedure: L5-S1 LESI with fluoroscopic guidance and without moderate sedation  NOTE: The risks, benefits, and expectations of the procedure have been discussed and explained to the patient who was understanding and in agreement with suggested treatment plan. No guarantees were made.  DESCRIPTION OF PROCEDURE: Lumbar epidural steroid injection  with no IV Versed, EKG, blood pressure, pulse, and pulse oximetry monitoring. The procedure was performed with the patient in the prone position under fluoroscopic guidance.  Sterile prep x3 was initiated and I then injected subcutaneous lidocaine to the overlying L5-S1 site after its fluoroscopic identifictation.  Using strict aseptic technique, I then advanced an 18-gauge Tuohy epidural needle in the midline using interlaminar approach via loss-of-resistance to saline technique. There was negative aspiration for heme or  CSF.  I then confirmed position with both AP and Lateral fluoroscan.  2 cc of contrast dye were injected and a  total of 5 mL of Preservative-Free normal saline mixed with 40 mg of Kenalog and 1cc Ropicaine 0.2 percent were injected incrementally via the  epidurally placed needle. The needle was removed. The patient tolerated the injection well and was convalesced and discharged to home in stable condition. Should the patient have any post procedure difficulty they have been instructed on how to contact us for assistance.   Molli Barrows, MD

## 2022-06-30 LAB — TOXASSURE SELECT 13 (MW), URINE

## 2022-07-19 ENCOUNTER — Other Ambulatory Visit: Payer: Self-pay | Admitting: Cardiovascular Disease

## 2022-07-21 ENCOUNTER — Telehealth: Payer: Self-pay | Admitting: Cardiovascular Disease

## 2022-07-21 ENCOUNTER — Ambulatory Visit: Payer: Medicare HMO | Attending: Anesthesiology | Admitting: Anesthesiology

## 2022-07-21 DIAGNOSIS — M5136 Other intervertebral disc degeneration, lumbar region: Secondary | ICD-10-CM

## 2022-07-21 DIAGNOSIS — G4486 Cervicogenic headache: Secondary | ICD-10-CM

## 2022-07-21 DIAGNOSIS — M5432 Sciatica, left side: Secondary | ICD-10-CM

## 2022-07-21 DIAGNOSIS — M48062 Spinal stenosis, lumbar region with neurogenic claudication: Secondary | ICD-10-CM

## 2022-07-21 DIAGNOSIS — G894 Chronic pain syndrome: Secondary | ICD-10-CM | POA: Diagnosis not present

## 2022-07-21 DIAGNOSIS — M5431 Sciatica, right side: Secondary | ICD-10-CM | POA: Diagnosis not present

## 2022-07-21 DIAGNOSIS — F1190 Opioid use, unspecified, uncomplicated: Secondary | ICD-10-CM

## 2022-07-21 DIAGNOSIS — M4716 Other spondylosis with myelopathy, lumbar region: Secondary | ICD-10-CM | POA: Diagnosis not present

## 2022-07-21 DIAGNOSIS — M47817 Spondylosis without myelopathy or radiculopathy, lumbosacral region: Secondary | ICD-10-CM

## 2022-07-21 MED ORDER — CARVEDILOL 3.125 MG PO TABS: 3.1250 mg | ORAL_TABLET | Freq: Two times a day (BID) | ORAL | 1 refills | Status: DC

## 2022-07-21 MED ORDER — HYDROCODONE-ACETAMINOPHEN 7.5-325 MG PO TABS: 1.0000 | ORAL_TABLET | Freq: Four times a day (QID) | ORAL | 0 refills | Status: AC | PRN

## 2022-07-21 NOTE — Telephone Encounter (Signed)
*  STAT* If patient is at the pharmacy, call can be transferred to refill team.   1. Which medications need to be refilled? (please list name of each medication and dose if known)   carvedilol (COREG) 3.125 MG tablet    2. Which pharmacy/location (including street and city if local pharmacy) is medication to be sent to?   [facility] - [place] ([address] [place] - [address]    3. Do they need a 30 day or 90 day supply?  60 - pt had a 30 day supply they are requesting another 60 for 90 day supply

## 2022-08-14 ENCOUNTER — Ambulatory Visit
Admission: RE | Admit: 2022-08-14 | Discharge: 2022-08-14 | Disposition: A | Discharge status: Home / Self Care | Payer: Medicare HMO | Source: Ambulatory Visit | Attending: Nurse Practitioner | Admitting: Nurse Practitioner

## 2022-08-14 ENCOUNTER — Other Ambulatory Visit: Payer: Self-pay | Admitting: Nurse Practitioner

## 2022-08-14 DIAGNOSIS — R519 Headache, unspecified: Secondary | ICD-10-CM

## 2022-08-19 ENCOUNTER — Ambulatory Visit: Payer: Medicare HMO | Attending: Anesthesiology | Admitting: Anesthesiology

## 2022-08-19 DIAGNOSIS — G894 Chronic pain syndrome: Secondary | ICD-10-CM

## 2022-08-19 DIAGNOSIS — M48062 Spinal stenosis, lumbar region with neurogenic claudication: Secondary | ICD-10-CM

## 2022-08-19 DIAGNOSIS — M5431 Sciatica, right side: Secondary | ICD-10-CM

## 2022-08-19 DIAGNOSIS — F1190 Opioid use, unspecified, uncomplicated: Secondary | ICD-10-CM

## 2022-08-19 DIAGNOSIS — M5432 Sciatica, left side: Secondary | ICD-10-CM

## 2022-08-19 DIAGNOSIS — M4716 Other spondylosis with myelopathy, lumbar region: Secondary | ICD-10-CM

## 2022-08-22 ENCOUNTER — Other Ambulatory Visit: Payer: Self-pay

## 2022-08-27 ENCOUNTER — Encounter: Payer: Self-pay | Admitting: Gastroenterology

## 2022-08-27 ENCOUNTER — Ambulatory Visit: Payer: Medicare HMO | Admitting: Gastroenterology

## 2022-08-28 ENCOUNTER — Telehealth: Payer: Self-pay | Admitting: Anesthesiology

## 2022-08-28 NOTE — Telephone Encounter (Signed)
Patient needs meds sent in by the 5th. Her appt is on the 8th. Please ask Dr Pernell Dupre to send in med script

## 2022-08-28 NOTE — Telephone Encounter (Signed)
Notified patient that she had a script for Hydrocodone  waiting at the pharmacy.  Left message.

## 2022-09-02 ENCOUNTER — Ambulatory Visit: Payer: Medicare HMO | Attending: Cardiology | Admitting: Cardiology

## 2022-09-02 NOTE — Progress Notes (Deleted)
Cardiology Office Note:   Date:  09/02/2022  ID:  Tina Hayes, DOB 09-22-1945, MRN [id number]  History of Present Illness:   Tina Hayes is a 77 y.o. 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  March 2022 had a  Lexiscan that was considered low risk with a normal EF 55 to 60%.  Echocardiogram in April 2022 showed ejection fraction 55 to 60%, mild to moderate TR.  In March 2021 she underwent percutaneous transluminal angioplasty of the right anterior tibial artery with 3 mm diameter angioplasty balloon and of the right popliteal artery with 5 mm diameter Lutonix drug-coated angioplasty balloon with stent placement to the right popliteal artery.  Last seen in clinic by Dr. Mariah Milling 02/04/2022.  At that time she was following up with chest pain, chronic diastolic congestive heart failure she had also needed preop cardiovascular evaluation for an EGD.  Blood pressure was well-controlled tolerating torsemide 20 mg twice daily, atypical chest pain with a moderate-sized hiatal hernia noted in 2019 with a ring requiring dilatation on PPI twice daily.  No symptoms of angina and no further workup at that time was needed..  She presented to the [facility] emergency department on 04/10/2022 with generalized weakness and was found to have a lower UTI, had acute onset of intermittent chest pain with palpitations with associated generalized weakness for the last 3 to 4 days.  Hemoglobin was found to be 7.7 and dropped down to 6.9 patient did not have any active bleeding or black tarry stools noted she was transfused 1 unit of packed RBCs.  Troponins were negative and there were no EKG changes.  She was referred to hematology on  discharge received antibiotic therapy for the UTI.  She was considered stable and subsequently discharged on 04/12/2022.  She returns to clinic today    ROS: ***  Studies Reviewed:    EKG:  ***  TTE 08/02/20  1. Left ventricular ejection fraction, by estimation, is 55 to 60%. Left  ventricular ejection fraction by 3D volume is 58 %. The left ventricle has  normal function. The left ventricle has no regional wall motion  abnormalities. There is mild left  ventricular hypertrophy. Left ventricular diastolic parameters are  consistent with Grade I diastolic dysfunction (impaired relaxation).   2. Right ventricular systolic function is normal. The right ventricular  size is normal. There is normal pulmonary artery systolic pressure.   3. Left atrial size was mildly dilated.   4. The mitral valve is grossly normal. Mild mitral valve regurgitation.   5. Tricuspid valve regurgitation is mild to moderate.   6. The aortic valve is tricuspid. Aortic valve regurgitation is not  visualized. Mild to moderate aortic valve sclerosis/calcification is  present, without any evidence of aortic stenosis.   7. The inferior vena cava is normal in size with greater than 50%  respiratory variability, suggesting right atrial pressure of 3 mmHg.   Risk Assessment/Calculations:     No BP recorded.  {Refresh Note OR Click here to enter BP  :1}***        Physical Exam:   VS:  There were no vitals taken for this visit.   Wt Readings from Last 3 Encounters:  06/26/22 260 lb (117.9  kg)  04/10/22 280 lb (127 kg)  02/05/22 278 lb 8 oz (126.3 kg)     GEN: Well nourished, well developed in no acute distress NECK: No JVD; No carotid bruits CARDIAC: ***RRR, no murmurs, rubs, gallops RESPIRATORY:  Clear to auscultation without rales, wheezing or rhonchi  ABDOMEN: Soft, non-tender, non-distended EXTREMITIES:  No edema; No deformity   ASSESSMENT AND PLAN:   ***    {Are you ordering a CV Procedure (e.g.  stress test, cath, DCCV, TEE, etc)?   Press F2        :[id number]}   Signed, Eun Vermeer, NP

## 2022-09-03 ENCOUNTER — Ambulatory Visit: Payer: Medicare HMO | Attending: Anesthesiology | Admitting: Anesthesiology

## 2022-09-03 ENCOUNTER — Encounter: Payer: Self-pay | Admitting: Cardiology

## 2022-09-03 DIAGNOSIS — M4716 Other spondylosis with myelopathy, lumbar region: Secondary | ICD-10-CM

## 2022-09-03 DIAGNOSIS — G894 Chronic pain syndrome: Secondary | ICD-10-CM

## 2022-09-03 DIAGNOSIS — G4486 Cervicogenic headache: Secondary | ICD-10-CM

## 2022-09-03 DIAGNOSIS — M48062 Spinal stenosis, lumbar region with neurogenic claudication: Secondary | ICD-10-CM | POA: Diagnosis not present

## 2022-09-03 DIAGNOSIS — M170 Bilateral primary osteoarthritis of knee: Secondary | ICD-10-CM

## 2022-09-03 DIAGNOSIS — M5136 Other intervertebral disc degeneration, lumbar region: Secondary | ICD-10-CM | POA: Diagnosis not present

## 2022-09-03 DIAGNOSIS — M5432 Sciatica, left side: Secondary | ICD-10-CM

## 2022-09-03 DIAGNOSIS — M5431 Sciatica, right side: Secondary | ICD-10-CM

## 2022-09-03 DIAGNOSIS — F1190 Opioid use, unspecified, uncomplicated: Secondary | ICD-10-CM

## 2022-09-03 DIAGNOSIS — M47817 Spondylosis without myelopathy or radiculopathy, lumbosacral region: Secondary | ICD-10-CM

## 2022-09-03 MED ORDER — HYDROCODONE-ACETAMINOPHEN 7.5-325 MG PO TABS: 1.0000 | ORAL_TABLET | Freq: Four times a day (QID) | ORAL | 0 refills | Status: AC | PRN

## 2022-09-03 MED ORDER — HYDROCODONE-ACETAMINOPHEN 5-325 MG PO TABS: 1.0000 | ORAL_TABLET | Freq: Four times a day (QID) | ORAL | 0 refills | Status: DC | PRN

## 2022-09-03 NOTE — Progress Notes (Signed)
Virtual Visit via Telephone Note  I connected with Tina Hayes on 09/03/22 at  2:00 PM EDT by telephone and verified that I am speaking with the correct person using two identifiers.  Location: Patient: Home Provider: Pain control   I discussed the limitations, risks, security and privacy concerns of performing an evaluation and management service by telephone and the availability of in person appointments. I also discussed with the patient that there may be a patient responsible charge related to this service. The patient expressed understanding and agreed to proceed.   History of Present Illness: I was able to catch up with care this today via telephone.  She was unable to do the video link.  She reports that she did very well with her most recent epidural back in February.  She had 10 weeks of 80 to 90% improvement of her low back pain and sciatica symptoms.  Only over the last 2 weeks as she had recurrence of a similar pain.  Her low back pain, low chronic, continues to respond to hydrocodone 5 mg tablets taken about 3-4 times a day on average.  The combination of intermittent epidural steroid injections along with the every 4 to 6-hour hydrocodone keeps her functional active and sleeping better.  She maintains that it enables her to perform daily household routine and chores.  No side effects of the medications are reported.  She has failed conservative therapy with medication management alone in addition to stretching strengthening physical therapy type modalities.  Review of systems: General: No fevers or chills Pulmonary: No shortness of breath or dyspnea Cardiac: No angina or palpitations or lightheadedness GI: No abdominal pain or constipation Psych: No depression    Observations/Objective:   Current Outpatient Medications:    HYDROcodone-acetaminophen (NORCO) 7.5-325 MG tablet, Take 1 tablet by mouth every 6 (six) hours as needed for moderate pain or severe pain., Disp: 120  tablet, Rfl: 0   [START ON 10/03/2022] HYDROcodone-acetaminophen (NORCO/VICODIN) 5-325 MG tablet, Take 1 tablet by mouth every 6 (six) hours as needed for moderate pain or severe pain., Disp: 120 tablet, Rfl: 0   acetaminophen (TYLENOL) 500 MG tablet, Take 500 mg by mouth every 6 (six) hours as needed for headache, moderate pain or fever., Disp: , Rfl:    albuterol (PROVENTIL HFA;VENTOLIN HFA) 108 (90 Base) MCG/ACT inhaler, Inhale 2 puffs into the lungs every 6 (six) hours as needed for wheezing or shortness of breath., Disp: 1 Inhaler, Rfl: 2   albuterol (PROVENTIL) (2.5 MG/3ML) 0.083% nebulizer solution, Take 2.5 mg every 4 (four) hours as needed by nebulization for wheezing or shortness of breath., Disp: , Rfl:    amitriptyline (ELAVIL) 25 MG tablet, Take 25 mg by mouth at bedtime., Disp: , Rfl:    amLODipine (NORVASC) 5 MG tablet, Take 1 tablet (5 mg total) by mouth daily., Disp: 90 tablet, Rfl: 3   atorvastatin (LIPITOR) 40 MG tablet, Take 1 tablet (40 mg total) by mouth daily., Disp: 90 tablet, Rfl: 3   B-D UF III MINI PEN NEEDLES 31G X 5 MM MISC, USE WITH INSULIN PEN INJECTIONS TWICE DAILY, Disp: , Rfl:    carvedilol (COREG) 3.125 MG tablet, Take 1 tablet (3.125 mg total) by mouth 2 (two) times daily with a meal., Disp: 90 tablet, Rfl: 1   clopidogrel (PLAVIX) 75 MG tablet, Take 1 tablet (75 mg total) by mouth daily., Disp: 90 tablet, Rfl: 3   divalproex (DEPAKOTE) 250 MG DR tablet, Take 250 mg by mouth 2 (  two) times daily., Disp: , Rfl:    DUPIXENT 300 MG/2ML SOPN, Inject into the skin., Disp: , Rfl:    iron polysaccharides (NIFEREX) 150 MG capsule, Take 1 capsule (150 mg total) by mouth daily., Disp: 30 capsule, Rfl: 0   isosorbide mononitrate (IMDUR) 30 MG 24 hr tablet, Take 1 tablet (30 mg total) by mouth daily., Disp: 180 tablet, Rfl: 3   LANTUS SOLOSTAR 100 UNIT/ML Solostar Pen, Inject 30-40 Units into the skin See admin instructions. Inject 30 units subcutaneously in the morning at 40  units subcutaneously at bedtime., Disp: , Rfl:    levETIRAcetam (KEPPRA) 750 MG tablet, Take 750 mg by mouth 2 (two) times daily., Disp: , Rfl:    losartan (COZAAR) 100 MG tablet, Take 1 tablet by mouth once daily, Disp: 90 tablet, Rfl: 3   methocarbamol (ROBAXIN) 750 MG tablet, TAKE 1 TABLET BY MOUTH EVERY 8 HOURS AS NEEDED FOR MUSCLE SPASM, Disp: 90 tablet, Rfl: 0   montelukast (SINGULAIR) 10 MG tablet, Take 10 mg by mouth daily., Disp: , Rfl:    nitroGLYCERIN (NITROSTAT) 0.4 MG SL tablet, DISSOLVE ONE TABLET UNDER THE TONGUE EVERY 5 MINUTES AS NEEDED FOR CHEST PAIN.  DO NOT EXCEED A TOTAL OF 3 DOSES IN 15 MINUTES (Patient not taking: Reported on 04/10/2022), Disp: 25 tablet, Rfl: 0   omeprazole (PRILOSEC) 40 MG capsule, Take 1 capsule (40 mg total) by mouth 2 (two) times daily before a meal., Disp: 180 capsule, Rfl: 3   potassium chloride SA (KLOR-CON) 20 MEQ tablet, Take 2 tablets (40 mEq total) by mouth 2 (two) times daily. Take extra 2 tablets (40 meq) if you take metolazone, Disp: 140 tablet, Rfl: 5   sertraline (ZOLOFT) 100 MG tablet, Take 150 mg by mouth daily., Disp: , Rfl:    sucralfate (CARAFATE) 1 GM/10ML suspension, Take 10 mLs (1 g total) by mouth 4 (four) times daily., Disp: 1200 mL, Rfl: 3   tiotropium (SPIRIVA) 18 MCG inhalation capsule, Place 18 mcg into inhaler and inhale daily., Disp: , Rfl:    torsemide (DEMADEX) 20 MG tablet, Take 1 tablet (20 mg) by mouth twice daily, Disp: , Rfl:    traZODone (DESYREL) 50 MG tablet, Take 50 mg by mouth at bedtime., Disp: , Rfl:    Past Medical History:  Diagnosis Date   (HFpEF) heart failure with preserved ejection fraction (HCC)    a. 05/2016 Echo: EF 60-65%, mild to mod LVH, Gr1 DD, mild MR, mildly dil LA, mod TR, mildly to mod increased PASP; b. 07/2020 Echo: EF 55-60%, no rwma, mild LVH, GrIDD, mildly dil LA, mild MR, mild-mod TR, mild-mod AoV sclerosis w/o stenosis.   Anxiety    Arthritis    Chronic back pain    COPD (chronic  obstructive pulmonary disease) (HCC)    Coronary artery disease    a. s/p remote PCI x 5;  b. 2006 s/p CABG x 3 ([place], [place] - [facility]); b. 05/2016 MV: attenuation corrected images w/o ischemia or wma-->Med rx; c. 06/2020 MV: EF 55-65%, no isch/infart-->low risk.   Depression    Diabetes mellitus without complication (HCC)    Essential hypertension 06/30/2016   GERD (gastroesophageal reflux disease)    Heart attack (HCC)    Total of 3 per pt.   Hyperlipidemia LDL goal <55    Hypertensive urgency 06/03/2015   PAD (peripheral artery disease) (HCC)    a. 06/2019 Angio: R AT (PTA), R POP (PTA & Viabahn stenting); b. 09/2020 ABIs:  R 1.24, L 1.35.   Palpitations    a. 06/2020 Zio: Sinus brady, 54 (40-143), 4 beats NSVT, 3 SVT runs (max 143 bpm x 4 beats).   Seizure (HCC)     Assessment and Plan:  1. Bilateral sciatica   2. Lumbar spondylosis with myelopathy   3. DDD (degenerative disc disease), lumbar   4. Spinal stenosis of lumbar region with neurogenic claudication   5. Chronic, continuous use of opioids   6. Facet arthritis of lumbosacral region   7. Morbid obesity (HCC)   8. Cervicogenic headache   9. Chronic pain syndrome   10. Primary osteoarthritis of both knees   Based on our conversation today I think is appropriate to schedule her for repeat epidural.  Her last injection back in February was very effective giving her 75% 80% relief of her low back pain and eliminated her sciatica symptoms for about 10 weeks.  She has had a gradual recurrence of a similar pain over the course of the last few weeks.  She is requesting returning to clinic for repeat epidural within the next 2 to 3 weeks.  I think this is reasonable.  She understands the risks benefits of the procedure.  We gone over this once again.  I will have her continue with her current medication management which she is overdue for.  Refills will be given for May 8 and June 7.  She is on Plavix and knows to stop  this 5 to 7 days in advance of the procedure.  Continue follow-up with her primary care physicians for baseline medical care.  Follow Up Instructions:    I discussed the assessment and treatment plan with the patient. The patient was provided an opportunity to ask questions and all were answered. The patient agreed with the plan and demonstrated an understanding of the instructions.   The patient was advised to call back or seek an in-person evaluation if the symptoms worsen or if the condition fails to improve as anticipated.  I provided 30 minutes of non-face-to-face time during this encounter.   Yevette Edwards, MD

## 2022-09-23 ENCOUNTER — Other Ambulatory Visit: Payer: Self-pay | Admitting: Nurse Practitioner

## 2022-09-29 ENCOUNTER — Telehealth: Payer: Self-pay | Admitting: Anesthesiology

## 2022-09-29 NOTE — Telephone Encounter (Signed)
Patient would like Dr Pernell Dupre to send in to her insurance stating why she needs to get a new bed so they will pay for it. He just needs to put in a letter stating why she would benefit from a new bed to help her pain. I explained he is out of town for 2 weeks and we couldn't let her know before then.

## 2022-10-16 ENCOUNTER — Ambulatory Visit: Payer: Medicare HMO | Admitting: Anesthesiology

## 2022-10-17 ENCOUNTER — Encounter: Payer: Self-pay | Admitting: Anesthesiology

## 2022-10-17 NOTE — Progress Notes (Signed)
I was unable to reach patient via telephone.  Repeat follow-up appointment has been requested.

## 2022-10-20 ENCOUNTER — Ambulatory Visit: Payer: Medicare HMO | Admitting: Anesthesiology

## 2022-10-21 ENCOUNTER — Encounter: Payer: Self-pay | Admitting: Anesthesiology

## 2022-10-21 ENCOUNTER — Other Ambulatory Visit: Payer: Self-pay | Admitting: Anesthesiology

## 2022-10-21 ENCOUNTER — Ambulatory Visit
Admission: RE | Admit: 2022-10-21 | Discharge: 2022-10-21 | Disposition: A | Discharge status: Home / Self Care | Payer: Medicare HMO | Source: Ambulatory Visit | Attending: Anesthesiology | Admitting: Anesthesiology

## 2022-10-21 ENCOUNTER — Ambulatory Visit (HOSPITAL_BASED_OUTPATIENT_CLINIC_OR_DEPARTMENT_OTHER): Payer: Medicare HMO | Admitting: Anesthesiology

## 2022-10-21 VITALS — BP 122/87 | HR 58 | Temp 97.0°F | Resp 18 | Ht 67.0 in | Wt 270.0 lb

## 2022-10-21 DIAGNOSIS — G4486 Cervicogenic headache: Secondary | ICD-10-CM

## 2022-10-21 DIAGNOSIS — M5432 Sciatica, left side: Secondary | ICD-10-CM | POA: Insufficient documentation

## 2022-10-21 DIAGNOSIS — G894 Chronic pain syndrome: Secondary | ICD-10-CM | POA: Diagnosis present

## 2022-10-21 DIAGNOSIS — R52 Pain, unspecified: Secondary | ICD-10-CM

## 2022-10-21 DIAGNOSIS — M5136 Other intervertebral disc degeneration, lumbar region: Secondary | ICD-10-CM | POA: Diagnosis present

## 2022-10-21 DIAGNOSIS — F1190 Opioid use, unspecified, uncomplicated: Secondary | ICD-10-CM

## 2022-10-21 DIAGNOSIS — M48062 Spinal stenosis, lumbar region with neurogenic claudication: Secondary | ICD-10-CM | POA: Insufficient documentation

## 2022-10-21 DIAGNOSIS — M5431 Sciatica, right side: Secondary | ICD-10-CM | POA: Diagnosis present

## 2022-10-21 DIAGNOSIS — M4716 Other spondylosis with myelopathy, lumbar region: Secondary | ICD-10-CM

## 2022-10-21 DIAGNOSIS — M47817 Spondylosis without myelopathy or radiculopathy, lumbosacral region: Secondary | ICD-10-CM | POA: Diagnosis present

## 2022-10-21 MED ORDER — LIDOCAINE HCL (PF) 1 % IJ SOLN
INTRAMUSCULAR | Status: AC
  Filled 2022-10-21: qty 5

## 2022-10-21 MED ORDER — HYDROCODONE-ACETAMINOPHEN 5-325 MG PO TABS: 1.0000 | ORAL_TABLET | Freq: Four times a day (QID) | ORAL | 0 refills | Status: AC | PRN

## 2022-10-21 MED ORDER — MIDAZOLAM HCL 2 MG/2ML IJ SOLN
2.0000 mg | Freq: Once | INTRAMUSCULAR | Status: AC
  Administered 2022-10-21: 2 mg via INTRAVENOUS

## 2022-10-21 MED ORDER — ROPIVACAINE HCL 2 MG/ML IJ SOLN
10.0000 mL | Freq: Once | INTRAMUSCULAR | Status: AC
  Administered 2022-10-21: 1 mL via EPIDURAL

## 2022-10-21 MED ORDER — IOHEXOL 180 MG/ML  SOLN
10.0000 mL | Freq: Once | INTRAMUSCULAR | Status: AC | PRN
  Administered 2022-10-21: 10 mL via EPIDURAL

## 2022-10-21 MED ORDER — TRIAMCINOLONE ACETONIDE 40 MG/ML IJ SUSP
INTRAMUSCULAR | Status: AC
  Filled 2022-10-21: qty 1

## 2022-10-21 MED ORDER — LIDOCAINE HCL (PF) 1 % IJ SOLN
5.0000 mL | Freq: Once | INTRAMUSCULAR | Status: AC
  Administered 2022-10-21: 5 mL via SUBCUTANEOUS

## 2022-10-21 MED ORDER — IOHEXOL 180 MG/ML  SOLN
INTRAMUSCULAR | Status: AC
  Filled 2022-10-21: qty 20

## 2022-10-21 MED ORDER — ROPIVACAINE HCL 2 MG/ML IJ SOLN
INTRAMUSCULAR | Status: AC
  Filled 2022-10-21: qty 20

## 2022-10-21 MED ORDER — SODIUM CHLORIDE (PF) 0.9 % IJ SOLN
INTRAMUSCULAR | Status: AC
  Filled 2022-10-21: qty 10

## 2022-10-21 MED ORDER — LACTATED RINGERS IV SOLN: INTRAVENOUS | Status: DC

## 2022-10-21 MED ORDER — MIDAZOLAM HCL 2 MG/2ML IJ SOLN
INTRAMUSCULAR | Status: AC
  Filled 2022-10-21: qty 2

## 2022-10-21 MED ORDER — SODIUM CHLORIDE 0.9% FLUSH
10.0000 mL | Freq: Once | INTRAVENOUS | Status: AC
  Administered 2022-10-21: 10 mL

## 2022-10-21 MED ORDER — TRIAMCINOLONE ACETONIDE 40 MG/ML IJ SUSP
40.0000 mg | Freq: Once | INTRAMUSCULAR | Status: AC
  Administered 2022-10-21: 40 mg

## 2022-10-21 NOTE — Progress Notes (Signed)
Subjective:  Patient ID: Tina Hayes, female    DOB: 01/09/1946  Age: 77 y.o. MRN: [id number]  CC: Back Pain (lower)   Procedure: Caudal epidural under fluoroscopic guidance with minimal sedation  HPI [name] presents for reevaluation.  [name] has intermittent exacerbations of sciatica symptoms that are unresponsive to conservative therapy.  She has been undergoing a series of caudal epidurals for the past 4 to 5 years.  She gets approximately 2 of these per year and generally gets 4 months of relief following each injection.  She presents today requesting repeat caudal secondary to an aching gnawing pain that is radiating from the low back into the bilateral hips buttocks and down both posterior legs.  The quality of this pain is comparable to what she has had in the past and is responsive to caudal epidural injection.  She has been off her blood thinners for the program at a time.  No other change in strength function bowel or bladder function is noted at this time.  She still has her chronic back pain for which she takes her hydrocodone 4 times a day and this continues to work well for her.  She gets about 50 to 70% relief for about 4 to 6 hours following administration of her opioid medications.  Unfortunately she has failed more conservative therapy and is on this chronically but with good relief and that it enables her to be more functional around the house and sleep better at night.  Outpatient Medications Prior to Visit  Medication Sig Dispense Refill   acetaminophen (TYLENOL) 500 MG tablet Take 500 mg by mouth every 6 (six) hours as needed for headache, moderate pain or fever.     albuterol (PROVENTIL HFA;VENTOLIN HFA) 108 (90 Base) MCG/ACT inhaler Inhale 2 puffs into the lungs every 6 (six) hours as needed for wheezing or shortness of breath. 1 Inhaler 2   albuterol (PROVENTIL) (2.5 MG/3ML) 0.083% nebulizer solution Take 2.5 mg every 4 (four) hours as needed by nebulization for  wheezing or shortness of breath.     amitriptyline (ELAVIL) 25 MG tablet Take 25 mg by mouth at bedtime.     amLODipine (NORVASC) 5 MG tablet Take 1 tablet by mouth once daily 90 tablet 1   atorvastatin (LIPITOR) 40 MG tablet Take 1 tablet (40 mg total) by mouth daily. 90 tablet 3   B-D UF III MINI PEN NEEDLES 31G X 5 MM MISC USE WITH INSULIN PEN INJECTIONS TWICE DAILY     carvedilol (COREG) 3.125 MG tablet Take 1 tablet (3.125 mg total) by mouth 2 (two) times daily with a meal. 90 tablet 1   clopidogrel (PLAVIX) 75 MG tablet Take 1 tablet (75 mg total) by mouth daily. 90 tablet 3   divalproex (DEPAKOTE) 250 MG DR tablet Take 250 mg by mouth 2 (two) times daily.     DUPIXENT 300 MG/2ML SOPN Inject into the skin.     iron polysaccharides (NIFEREX) 150 MG capsule Take 1 capsule (150 mg total) by mouth daily. 30 capsule 0   isosorbide mononitrate (IMDUR) 30 MG 24 hr tablet Take 1 tablet (30 mg total) by mouth daily. 180 tablet 3   LANTUS SOLOSTAR 100 UNIT/ML Solostar Pen Inject 30-40 Units into the skin See admin instructions. Inject 30 units subcutaneously in the morning at 40 units subcutaneously at bedtime.     levETIRAcetam (KEPPRA) 750 MG tablet Take 750 mg by mouth 2 (two) times daily.     losartan (COZAAR)  100 MG tablet Take 1 tablet by mouth once daily 90 tablet 3   methocarbamol (ROBAXIN) 750 MG tablet TAKE 1 TABLET BY MOUTH EVERY 8 HOURS AS NEEDED FOR MUSCLE SPASM 90 tablet 0   montelukast (SINGULAIR) 10 MG tablet Take 10 mg by mouth daily.     nitroGLYCERIN (NITROSTAT) 0.4 MG SL tablet DISSOLVE ONE TABLET UNDER THE TONGUE EVERY 5 MINUTES AS NEEDED FOR CHEST PAIN.  DO NOT EXCEED A TOTAL OF 3 DOSES IN 15 MINUTES 25 tablet 0   omeprazole (PRILOSEC) 40 MG capsule Take 1 capsule (40 mg total) by mouth 2 (two) times daily before a meal. 180 capsule 3   potassium chloride SA (KLOR-CON) 20 MEQ tablet Take 2 tablets (40 mEq total) by mouth 2 (two) times daily. Take extra 2 tablets (40 meq) if you  take metolazone 140 tablet 5   sertraline (ZOLOFT) 100 MG tablet Take 150 mg by mouth daily.     sucralfate (CARAFATE) 1 GM/10ML suspension Take 10 mLs (1 g total) by mouth 4 (four) times daily. 1200 mL 3   tiotropium (SPIRIVA) 18 MCG inhalation capsule Place 18 mcg into inhaler and inhale daily.     torsemide (DEMADEX) 20 MG tablet Take 1 tablet (20 mg) by mouth twice daily     traZODone (DESYREL) 50 MG tablet Take 50 mg by mouth at bedtime.     HYDROcodone-acetaminophen (NORCO/VICODIN) 5-325 MG tablet Take 1 tablet by mouth every 6 (six) hours as needed for moderate pain or severe pain. 120 tablet 0   No facility-administered medications prior to visit.    Review of Systems CNS: No confusion or sedation Cardiac: No angina or palpitations GI: No abdominal pain or constipation Constitutional: No nausea vomiting fevers or chills  Objective:  BP 118/76   Pulse (!) 58   Temp 97.9 F (36.6 C) (Temporal)   Resp 20   Ht 5\' 7"  (1.702 m)   Wt 270 lb (122.5 kg)   SpO2 100%   BMI 42.29 kg/m    BP Readings from Last 3 Encounters:  10/21/22 118/76  06/26/22 134/71  04/12/22 114/67     Wt Readings from Last 3 Encounters:  10/21/22 270 lb (122.5 kg)  06/26/22 260 lb (117.9 kg)  04/10/22 280 lb (127 kg)     Physical Exam Pt is alert and oriented PERRL EOMI HEART IS RRR no murmur or rub LCTA no wheezing or rales MUSCULOSKELETAL reveals some paraspinous muscle tenderness but no overt trigger points.  She is in a wheelchair for ambulation today and her muscle tone and bulk is at baseline.  Labs  Lab Results  Component Value Date   HGBA1C 7.7 (H) 02/24/2021   HGBA1C 6.5 (H) 01/18/2019   HGBA1C 7.5 (H) 05/27/2018   Lab Results  Component Value Date   LDLCALC 58 01/30/2017   CREATININE 0.84 04/11/2022    -------------------------------------------------------------------------------------------------------------------- Lab Results  Component Value Date   WBC 6.7  04/12/2022   HGB 8.7 (L) 04/12/2022   HCT 29.7 (L) 04/12/2022   PLT 318 04/12/2022   GLUCOSE 175 (H) 04/11/2022   CHOL 120 01/30/2017   TRIG 73 01/30/2017   HDL 47 01/30/2017   LDLCALC 58 01/30/2017   ALT 15 08/09/2021   AST 22 08/09/2021   NA 140 04/11/2022   K 3.6 04/11/2022   CL 109 04/11/2022   CREATININE 0.84 04/11/2022   BUN 11 04/11/2022   CO2 25 04/11/2022   TSH 2.640 07/13/2020   INR 1.0 08/09/2021  HGBA1C 7.7 (H) 02/24/2021    --------------------------------------------------------------------------------------------------------------------- No results found.   Assessment & Plan:   Naomia was seen today for back pain.  Diagnoses and all orders for this visit:  Bilateral sciatica -     Lumbar Epidural Injection  DDD (degenerative disc disease), lumbar -     Lumbar Epidural Injection  Spinal stenosis of lumbar region with neurogenic claudication -     Lumbar Epidural Injection  Other orders -     HYDROcodone-acetaminophen (NORCO/VICODIN) 5-325 MG tablet; Take 1 tablet by mouth every 6 (six) hours as needed for moderate pain or severe pain. -     HYDROcodone-acetaminophen (NORCO/VICODIN) 5-325 MG tablet; Take 1 tablet by mouth every 6 (six) hours as needed for moderate pain or severe pain. -     triamcinolone acetonide (KENALOG-40) injection 40 mg -     sodium chloride flush (NS) 0.9 % injection 10 mL -     ropivacaine (PF) 2 mg/mL (0.2%) (NAROPIN) injection 10 mL -     midazolam (VERSED) injection 2 mg -     lidocaine (PF) (XYLOCAINE) 1 % injection 5 mL -     lactated ringers infusion -     iohexol (OMNIPAQUE) 180 MG/ML injection 10 mL        ----------------------------------------------------------------------------------------------------------------------  Problem List Items Addressed This Visit   None Visit Diagnoses     Bilateral sciatica       Relevant Medications   midazolam (VERSED) injection 2 mg (Start on 10/21/2022  1:45 PM)   DDD  (degenerative disc disease), lumbar       Relevant Medications   HYDROcodone-acetaminophen (NORCO/VICODIN) 5-325 MG tablet (Start on 11/01/2022)   HYDROcodone-acetaminophen (NORCO/VICODIN) 5-325 MG tablet (Start on 11/01/2022)   triamcinolone acetonide (KENALOG-40) injection 40 mg (Start on 10/21/2022  1:45 PM)   Spinal stenosis of lumbar region with neurogenic claudication       Relevant Medications   HYDROcodone-acetaminophen (NORCO/VICODIN) 5-325 MG tablet (Start on 11/01/2022)   HYDROcodone-acetaminophen (NORCO/VICODIN) 5-325 MG tablet (Start on 11/01/2022)   triamcinolone acetonide (KENALOG-40) injection 40 mg (Start on 10/21/2022  1:45 PM)   ropivacaine (PF) 2 mg/mL (0.2%) (NAROPIN) injection 10 mL (Start on 10/21/2022  1:45 PM)   lidocaine (PF) (XYLOCAINE) 1 % injection 5 mL (Start on 10/21/2022  1:45 PM)         ----------------------------------------------------------------------------------------------------------------------  1. Bilateral sciatica We will proceed with a repeat epidural today.  We gone over the risks and benefits of caudal epidural steroid injection.  I will have her restart her Plavix tomorrow.  Continue current medications and we will schedule her for return to clinic in 1 month. - Lumbar Epidural Injection  2. DDD (degenerative disc disease), lumbar Continue efforts at stretching strengthening and core strengthening as tolerated. - Lumbar Epidural Injection  3. Spinal stenosis of lumbar region with neurogenic claudication As above - Lumbar Epidural Injection  1. Lumbar spondylosis with myelopathy   2. Bilateral sciatica   3. DDD (degenerative disc disease), lumbar   4. Spinal stenosis of lumbar region with neurogenic claudication   5. Chronic, continuous use of opioids   6. Facet arthritis of lumbosacral region   7. Chronic pain syndrome   8. Cervicogenic headache   9. Morbid obesity (HCC)   As above and I have reviewed the [facility] practitioner  database information is appropriate for refill for medications.  These be dated for July 7 in August 6.  Continue follow-up with her primary care physician for baseline  medical care.  Will schedule her for return to clinic in 1 month.  I also had a long conversation with her regarding assistance around the house.  She did fall yesterday and she does have a potty chair and this was broken.  Based on her limited ambulatory capacity, chronic debilitated state and need for assistance around the house I think it is reasonable to request insurance assistance with acquiring a new potty chair for the house.  ----------------------------------------------------------------------------------------------------------------------  I am having Oleh Genin. Steines start on HYDROcodone-acetaminophen. I am also having her maintain her tiotropium, traZODone, albuterol, albuterol, levETIRAcetam, B-D UF III MINI PEN NEEDLES, clopidogrel, Lantus SoloStar, potassium chloride SA, torsemide, acetaminophen, isosorbide mononitrate, methocarbamol, Dupixent, sertraline, amitriptyline, montelukast, atorvastatin, nitroGLYCERIN, omeprazole, sucralfate, losartan, iron polysaccharides, carvedilol, divalproex, amLODipine, and HYDROcodone-acetaminophen.   Meds ordered this encounter  Medications   HYDROcodone-acetaminophen (NORCO/VICODIN) 5-325 MG tablet    Sig: Take 1 tablet by mouth every 6 (six) hours as needed for moderate pain or severe pain.    Dispense:  120 tablet    Refill:  0   HYDROcodone-acetaminophen (NORCO/VICODIN) 5-325 MG tablet    Sig: Take 1 tablet by mouth every 6 (six) hours as needed for moderate pain or severe pain.    Dispense:  120 tablet    Refill:  0   triamcinolone acetonide (KENALOG-40) injection 40 mg   sodium chloride flush (NS) 0.9 % injection 10 mL   ropivacaine (PF) 2 mg/mL (0.2%) (NAROPIN) injection 10 mL   midazolam (VERSED) injection 2 mg   lidocaine (PF) (XYLOCAINE) 1 % injection 5 mL   lactated  ringers infusion   iohexol (OMNIPAQUE) 180 MG/ML injection 10 mL   Patient's Medications  New Prescriptions   HYDROCODONE-ACETAMINOPHEN (NORCO/VICODIN) 5-325 MG TABLET    Take 1 tablet by mouth every 6 (six) hours as needed for moderate pain or severe pain.  Previous Medications   ACETAMINOPHEN (TYLENOL) 500 MG TABLET    Take 500 mg by mouth every 6 (six) hours as needed for headache, moderate pain or fever.   ALBUTEROL (PROVENTIL HFA;VENTOLIN HFA) 108 (90 BASE) MCG/ACT INHALER    Inhale 2 puffs into the lungs every 6 (six) hours as needed for wheezing or shortness of breath.   ALBUTEROL (PROVENTIL) (2.5 MG/3ML) 0.083% NEBULIZER SOLUTION    Take 2.5 mg every 4 (four) hours as needed by nebulization for wheezing or shortness of breath.   AMITRIPTYLINE (ELAVIL) 25 MG TABLET    Take 25 mg by mouth at bedtime.   AMLODIPINE (NORVASC) 5 MG TABLET    Take 1 tablet by mouth once daily   ATORVASTATIN (LIPITOR) 40 MG TABLET    Take 1 tablet (40 mg total) by mouth daily.   B-D UF III MINI PEN NEEDLES 31G X 5 MM MISC    USE WITH INSULIN PEN INJECTIONS TWICE DAILY   CARVEDILOL (COREG) 3.125 MG TABLET    Take 1 tablet (3.125 mg total) by mouth 2 (two) times daily with a meal.   CLOPIDOGREL (PLAVIX) 75 MG TABLET    Take 1 tablet (75 mg total) by mouth daily.   DIVALPROEX (DEPAKOTE) 250 MG DR TABLET    Take 250 mg by mouth 2 (two) times daily.   DUPIXENT 300 MG/2ML SOPN    Inject into the skin.   IRON POLYSACCHARIDES (NIFEREX) 150 MG CAPSULE    Take 1 capsule (150 mg total) by mouth daily.   ISOSORBIDE MONONITRATE (IMDUR) 30 MG 24 HR TABLET    Take 1  tablet (30 mg total) by mouth daily.   LANTUS SOLOSTAR 100 UNIT/ML SOLOSTAR PEN    Inject 30-40 Units into the skin See admin instructions. Inject 30 units subcutaneously in the morning at 40 units subcutaneously at bedtime.   LEVETIRACETAM (KEPPRA) 750 MG TABLET    Take 750 mg by mouth 2 (two) times daily.   LOSARTAN (COZAAR) 100 MG TABLET    Take 1 tablet by  mouth once daily   METHOCARBAMOL (ROBAXIN) 750 MG TABLET    TAKE 1 TABLET BY MOUTH EVERY 8 HOURS AS NEEDED FOR MUSCLE SPASM   MONTELUKAST (SINGULAIR) 10 MG TABLET    Take 10 mg by mouth daily.   NITROGLYCERIN (NITROSTAT) 0.4 MG SL TABLET    DISSOLVE ONE TABLET UNDER THE TONGUE EVERY 5 MINUTES AS NEEDED FOR CHEST PAIN.  DO NOT EXCEED A TOTAL OF 3 DOSES IN 15 MINUTES   OMEPRAZOLE (PRILOSEC) 40 MG CAPSULE    Take 1 capsule (40 mg total) by mouth 2 (two) times daily before a meal.   POTASSIUM CHLORIDE SA (KLOR-CON) 20 MEQ TABLET    Take 2 tablets (40 mEq total) by mouth 2 (two) times daily. Take extra 2 tablets (40 meq) if you take metolazone   SERTRALINE (ZOLOFT) 100 MG TABLET    Take 150 mg by mouth daily.   SUCRALFATE (CARAFATE) 1 GM/10ML SUSPENSION    Take 10 mLs (1 g total) by mouth 4 (four) times daily.   TIOTROPIUM (SPIRIVA) 18 MCG INHALATION CAPSULE    Place 18 mcg into inhaler and inhale daily.   TORSEMIDE (DEMADEX) 20 MG TABLET    Take 1 tablet (20 mg) by mouth twice daily   TRAZODONE (DESYREL) 50 MG TABLET    Take 50 mg by mouth at bedtime.  Modified Medications   Modified Medication Previous Medication   HYDROCODONE-ACETAMINOPHEN (NORCO/VICODIN) 5-325 MG TABLET HYDROcodone-acetaminophen (NORCO/VICODIN) 5-325 MG tablet      Take 1 tablet by mouth every 6 (six) hours as needed for moderate pain or severe pain.    Take 1 tablet by mouth every 6 (six) hours as needed for moderate pain or severe pain.  Discontinued Medications   No medications on file   ----------------------------------------------------------------------------------------------------------------------  Follow-up: Return in about 1 month (around 11/20/2022) for evaluation, med refill.  ***  Yevette Edwards, MD

## 2022-10-21 NOTE — Patient Instructions (Addendum)
Post-procedure Information What to expect: Most procedures involve the use of a local anesthetic (numbing medicine), and a steroid (anti-inflammatory medicine).  The local anesthetics may cause temporary numbness and weakness of the legs or arms, depending on the location of the block. This numbness/weakness may last 4-6 hours, depending on the local anesthetic used. In rare instances, it can last up to 24 hours. While numb, you must be very careful not to injure the extremity.  After any procedure, you could expect the pain to get better within 15-20 minutes. This relief is temporary and may last 4-6 hours. Once the local anesthetics wears off, you could experience discomfort, possibly more than usual, for up to 10 (ten) days. In the case of radiofrequencies, it may last up to 6 weeks. Surgeries may take up to 8 weeks for the healing process. The discomfort is due to the irritation caused by needles going through skin and muscle. To minimize the discomfort, we recommend using ice the first day, and heat from then on. The ice should be applied for 15 minutes on, and 15 minutes off. Keep repeating this cycle until bedtime. Avoid applying the ice directly to the skin, to prevent frostbite. Heat should be used daily, until the pain improves (4-10 days). Be careful not to burn yourself.  Occasionally you may experience muscle spasms or cramps. These occur as a consequence of the irritation caused by the needle sticks to the muscle and the blood that will inevitably be lost into the surrounding muscle tissue. Blood tends to be very irritating to tissues, which tend to react by going into spasm. These spasms may start the same day of your procedure, but they may also take days to develop. This late onset type of spasm or cramp is usually caused by electrolyte imbalances triggered by the steroids, at the level of the kidney. Cramps and spasms tend to respond well to muscle relaxants, multivitamins (some are  triggered by the procedure, but may have their origins in vitamin deficiencies), and "Gatorade", or any sports drinks that can replenish any electrolyte imbalances. (If you are a diabetic, ask your pharmacist to get you a sugar-free brand.) Warm showers or baths may also be helpful. Stretching exercises are highly recommended. General Instructions:  Be alert for signs of possible infection: redness, swelling, heat, red streaks, elevated temperature, and/or fever. These typically appear 4 to 6 days after the procedure. Immediately notify your doctor if you experience unusual bleeding, difficulty breathing, or loss of bowel or bladder control. If you experience increased pain, do not increase your pain medicine intake, unless instructed by your pain physician. Post-Procedure Care:  Be careful in moving about. Muscle spasms in the area of the injection may occur. Applying ice or heat to the area is often helpful. The incidence of spinal headaches after epidural injections ranges between 1.4% and 6%. If you develop a headache that does not seem to respond to conservative therapy, please let your physician know. This can be treated with an epidural blood patch.   Post-procedure numbness or redness is to be expected, however it should average 4 to 6 hours. If numbness and weakness of your extremities begins to develop 4 to 6 hours after your procedure, and is felt to be progressing and worsening, immediately contact your physician.   Diet:  If you experience nausea, do not eat until this sensation goes away. If you had a "Stellate Ganglion Block" for upper extremity "Reflex Sympathetic Dystrophy", do not eat or drink until your   hoarseness goes away. In any case, always start with liquids first and if you tolerate them well, then slowly progress to more solid foods. Activity:  For the first 4 to 6 hours after the procedure, use caution in moving about as you may experience numbness and/or weakness. Use caution in  cooking, using household electrical appliances, and climbing steps. If you need to reach your Doctor call our office: [phone] Monday-Thursday 8:00 am - 4:00 PM    Fridays: Closed     In case of an emergency: In case of emergency, call 911 or go to the nearest emergency room and have the physician there call us.  Interpretation of Procedure Every nerve block has two components: a diagnostic component, and a treatment component. Unrealistic expectations are the most common causes of "perceived failure".  In a perfect world, a single nerve block should be able to completely and permanently eliminate the pain. Sadly, the world is not perfect.  Most pain management nerve blocks are performed using local anesthetics and steroids. Steroids are responsible for any long-term benefit that you may experience. Their purpose is to decrease any chronic swelling that may exist in the area. Steroids begin to work immediately after being injected. However, most patients will not experience any benefits until 5 to 10 days after the injection, when the swelling has come down to the point where they can tell a difference. Steroids will only help if there is swelling to be treated. As such, they can assist with the diagnosis. If effective, they suggest an inflammatory component to the pain, and if ineffective, they rule out inflammation as the main cause or component of the problem. If the problem is one of mechanical compression, you will get no benefit from those steroids.   In the case of local anesthetics, they have a crucial role in the diagnosis of your condition. Most will begin to work within15 to 20 minutes after injection. The duration will depend on the type used (short- vs. Long-acting). It is of outmost importance that patients keep tract of their pain, after the procedure. To assist with this matter, a "Post-procedure Pain Diary" is provided. Make sure to complete it and to bring it back to your  follow-up appointment.  As long as the patient keeps accurate, detailed records of their symptoms after every procedure, and returns to have those interpreted, every procedure will provide us with invaluable information. Even a block that does not provide the patient with any relief, will always provide us with information about the mechanism and the origin of the pain. The only time a nerve block can be considered a waste of time is when patients do not keep track of the results, or do not keep their post-procedure appointment.  Reporting the results back to your physician The Pain Score  Pain is a subjective complaint. It cannot be seen, touched, or measured. We depend entirely on the patient's report of the pain in order to assess your condition and treatment. To evaluate the pain, we use a pain scale, where "0" means "No Pain", and a "10" is "the worst possible pain that you can even imagine" (i.e. something like been eaten alive by a shark or being torn apart by a lion).   You will frequently be asked to rate your pain. Please be as accurate, remember that medical decisions will be based on your responses. Please do not rate your pain above a 10. Doing so is actually interpreted as "symptom magnification" (exaggeration), as   well as lack of understanding with regards to the scale. To put this into perspective, when you tell us that your pain is at a 10 (ten), what you are saying is that there is nothing we can do to make this pain any worse. (Carefully think about that.)  ____________________________________________________________________________________________  Post-Procedure Discharge Instructions  Instructions: Apply ice:  Purpose: This will minimize any swelling and discomfort after procedure.  When: Day of procedure, as soon as you get home. How: Fill a plastic sandwich bag with crushed ice. Cover it with a small towel and apply to injection site. How long: (15 min on, 15 min off) Apply  for 15 minutes then remove x 15 minutes.  Repeat sequence on day of procedure, until you go to bed. Apply heat:  Purpose: To treat any soreness and discomfort from the procedure. When: Starting the next day after the procedure. How: Apply heat to procedure site starting the day following the procedure. How long: May continue to repeat daily, until discomfort goes away. Food intake: Start with clear liquids (like water) and advance to regular food, as tolerated.  Physical activities: Keep activities to a minimum for the first 8 hours after the procedure. After that, then as tolerated. Driving: If you have received any sedation, be responsible and do not drive. You are not allowed to drive for 24 hours after having sedation. Blood thinner: (Applies only to those taking blood thinners) You may restart your blood thinner 6 hours after your procedure. Insulin: (Applies only to Diabetic patients taking insulin) As soon as you can eat, you may resume your normal dosing schedule. Infection prevention: Keep procedure site clean and dry. Shower daily and clean area with soap and water. Post-procedure Pain Diary: Extremely important that this be done correctly and accurately. Recorded information will be used to determine the next step in treatment. For the purpose of accuracy, follow these rules: Evaluate only the area treated. Do not report or include pain from an untreated area. For the purpose of this evaluation, ignore all other areas of pain, except for the treated area. After your procedure, avoid taking a long nap and attempting to complete the pain diary after you wake up. Instead, set your alarm clock to go off every hour, on the hour, for the initial 8 hours after the procedure. Document the duration of the numbing medicine, and the relief you are getting from it. Do not go to sleep and attempt to complete it later. It will not be accurate. If you received sedation, it is likely that you were given a  medication that may cause amnesia. Because of this, completing the diary at a later time may cause the information to be inaccurate. This information is needed to plan your care. Follow-up appointment: Keep your post-procedure follow-up evaluation appointment after the procedure (usually 2 weeks for most procedures, 6 weeks for radiofrequencies). DO NOT FORGET to bring you pain diary with you.   Expect: (What should I expect to see with my procedure?) From numbing medicine (AKA: Local Anesthetics): Numbness or decrease in pain. You may also experience some weakness, which if present, could last for the duration of the local anesthetic. Onset: Full effect within 15 minutes of injected. Duration: It will depend on the type of local anesthetic used. On the average, 1 to 8 hours.  From steroids (Applies only if steroids were used): Decrease in swelling or inflammation. Once inflammation is improved, relief of the pain will follow. Onset of benefits: Depends on the   amount of swelling present. The more swelling, the longer it will take for the benefits to be seen. In some cases, up to 10 days. Duration: Steroids will stay in the system x 2 weeks. Duration of benefits will depend on multiple posibilities including persistent irritating factors. Side-effects: If present, they may typically last 2 weeks (the duration of the steroids). Frequent: Cramps (if they occur, drink Gatorade and take over-the-counter Magnesium 450-500 mg once to twice a day); water retention with temporary weight gain; increases in blood sugar; decreased immune system response; increased appetite. Occasional: Facial flushing (red, warm cheeks); mood swings; menstrual changes. Uncommon: Long-term decrease or suppression of natural hormones; bone thinning. (These are more common with higher doses or more frequent use. This is why we prefer that our patients avoid having any injection therapies in other practices.)  Very Rare: Severe mood  changes; psychosis; aseptic necrosis. From procedure: Some discomfort is to be expected once the numbing medicine wears off. This should be minimal if ice and heat are applied as instructed.  Call if: (When should I call?) You experience numbness and weakness that gets worse with time, as opposed to wearing off. New onset bowel or bladder incontinence. (Applies only to procedures done in the spine)  Emergency Numbers: Durning business hours (Monday - Thursday, 8:00 AM - 4:00 PM) (Friday, 9:00 AM - 12:00 Noon): [phone] After hours: [phone] NOTE: If you are having a problem and are unable connect with, or to talk to a provider, then go to your nearest urgent care or emergency department. If the problem is serious and urgent, please call 911. ____________________________________________________________________________________________    

## 2022-11-06 ENCOUNTER — Telehealth: Payer: Self-pay | Admitting: Anesthesiology

## 2022-11-06 ENCOUNTER — Other Ambulatory Visit: Payer: Self-pay | Admitting: *Deleted

## 2022-11-06 DIAGNOSIS — M5136 Other intervertebral disc degeneration, lumbar region: Secondary | ICD-10-CM

## 2022-11-06 DIAGNOSIS — M51369 Other intervertebral disc degeneration, lumbar region without mention of lumbar back pain or lower extremity pain: Secondary | ICD-10-CM

## 2022-11-06 DIAGNOSIS — M5432 Sciatica, left side: Secondary | ICD-10-CM

## 2022-11-06 DIAGNOSIS — M48062 Spinal stenosis, lumbar region with neurogenic claudication: Secondary | ICD-10-CM

## 2022-11-06 NOTE — Telephone Encounter (Signed)
Order printed and signed. Put on Dr. Pernell Dupre desk to sign. Will call patient when ready for pick-up.

## 2022-11-06 NOTE — Telephone Encounter (Signed)
PT states that she has been waiting on Dr. Pernell Dupre to send in prescription in for her to get a bedside commode. PT states that she really need this send in to her insurance asap. PT states that she has a problem with going to the bathroom, due to her falling. Please give patient a call. TY

## 2022-11-18 ENCOUNTER — Encounter: Payer: Self-pay | Admitting: Anesthesiology

## 2022-11-18 NOTE — Progress Notes (Signed)
Virtual Visit via Telephone Note  Patient: Home Provider: [facility]      History of Present Illness:  I was unable to get in touch with Tina Hayes despite multiple efforts and phone calls. Observations/Objective:  Current Outpatient Medications:    acetaminophen (TYLENOL) 500 MG tablet, Take 500 mg by mouth every 6 (six) hours as needed for headache, moderate pain or fever., Disp: , Rfl:    albuterol (PROVENTIL HFA;VENTOLIN HFA) 108 (90 Base) MCG/ACT inhaler, Inhale 2 puffs into the lungs every 6 (six) hours as needed for wheezing or shortness of breath., Disp: 1 Inhaler, Rfl: 2   albuterol (PROVENTIL) (2.5 MG/3ML) 0.083% nebulizer solution, Take 2.5 mg every 4 (four) hours as needed by nebulization for wheezing or shortness of breath., Disp: , Rfl:    amitriptyline (ELAVIL) 25 MG tablet, Take 25 mg by mouth at bedtime., Disp: , Rfl:    amLODipine (NORVASC) 5 MG tablet, Take 1 tablet by mouth once daily, Disp: 90 tablet, Rfl: 1   atorvastatin (LIPITOR) 40 MG tablet, Take 1 tablet (40 mg total) by mouth daily., Disp: 90 tablet, Rfl: 3   B-D UF III MINI PEN NEEDLES 31G X 5 MM MISC, USE WITH INSULIN PEN INJECTIONS TWICE DAILY, Disp: , Rfl:    carvedilol (COREG) 3.125 MG tablet, Take 1 tablet (3.125 mg total) by mouth 2 (two) times daily with a meal., Disp: 90 tablet, Rfl: 1   clopidogrel (PLAVIX) 75 MG tablet, Take 1 tablet (75 mg total) by mouth daily., Disp: 90 tablet, Rfl: 3   divalproex (DEPAKOTE) 250 MG DR tablet, Take 250 mg by mouth 2 (two) times daily., Disp: , Rfl:    DUPIXENT 300 MG/2ML SOPN, Inject into the skin., Disp: , Rfl:    HYDROcodone-acetaminophen (NORCO/VICODIN) 5-325 MG tablet, Take 1 tablet by mouth every 6 (six) hours as needed for moderate pain or severe pain., Disp: 120 tablet, Rfl: 0   HYDROcodone-acetaminophen (NORCO/VICODIN) 5-325 MG tablet, Take 1 tablet by mouth every 6 (six) hours as needed for moderate pain or severe pain., Disp: 120 tablet, Rfl: 0    iron polysaccharides (NIFEREX) 150 MG capsule, Take 1 capsule (150 mg total) by mouth daily., Disp: 30 capsule, Rfl: 0   isosorbide mononitrate (IMDUR) 30 MG 24 hr tablet, Take 1 tablet (30 mg total) by mouth daily., Disp: 180 tablet, Rfl: 3   LANTUS SOLOSTAR 100 UNIT/ML Solostar Pen, Inject 30-40 Units into the skin See admin instructions. Inject 30 units subcutaneously in the morning at 40 units subcutaneously at bedtime., Disp: , Rfl:    levETIRAcetam (KEPPRA) 750 MG tablet, Take 750 mg by mouth 2 (two) times daily., Disp: , Rfl:    losartan (COZAAR) 100 MG tablet, Take 1 tablet by mouth once daily, Disp: 90 tablet, Rfl: 3   methocarbamol (ROBAXIN) 750 MG tablet, TAKE 1 TABLET BY MOUTH EVERY 8 HOURS AS NEEDED FOR MUSCLE SPASM, Disp: 90 tablet, Rfl: 0   montelukast (SINGULAIR) 10 MG tablet, Take 10 mg by mouth daily., Disp: , Rfl:    nitroGLYCERIN (NITROSTAT) 0.4 MG SL tablet, DISSOLVE ONE TABLET UNDER THE TONGUE EVERY 5 MINUTES AS NEEDED FOR CHEST PAIN.  DO NOT EXCEED A TOTAL OF 3 DOSES IN 15 MINUTES, Disp: 25 tablet, Rfl: 0   omeprazole (PRILOSEC) 40 MG capsule, Take 1 capsule (40 mg total) by mouth 2 (two) times daily before a meal., Disp: 180 capsule, Rfl: 3   potassium chloride SA (KLOR-CON) 20 MEQ tablet, Take 2 tablets (40  mEq total) by mouth 2 (two) times daily. Take extra 2 tablets (40 meq) if you take metolazone, Disp: 140 tablet, Rfl: 5   sertraline (ZOLOFT) 100 MG tablet, Take 150 mg by mouth daily., Disp: , Rfl:    sucralfate (CARAFATE) 1 GM/10ML suspension, Take 10 mLs (1 g total) by mouth 4 (four) times daily., Disp: 1200 mL, Rfl: 3   tiotropium (SPIRIVA) 18 MCG inhalation capsule, Place 18 mcg into inhaler and inhale daily., Disp: , Rfl:    torsemide (DEMADEX) 20 MG tablet, Take 1 tablet (20 mg) by mouth twice daily, Disp: , Rfl:    traZODone (DESYREL) 50 MG tablet, Take 50 mg by mouth at bedtime., Disp: , Rfl:    Past Medical History:  Diagnosis Date   (HFpEF) heart failure with  preserved ejection fraction (HCC)    a. 05/2016 Echo: EF 60-65%, mild to mod LVH, Gr1 DD, mild MR, mildly dil LA, mod TR, mildly to mod increased PASP; b. 07/2020 Echo: EF 55-60%, no rwma, mild LVH, GrIDD, mildly dil LA, mild MR, mild-mod TR, mild-mod AoV sclerosis w/o stenosis.   Anxiety    Arthritis    Chronic back pain    COPD (chronic obstructive pulmonary disease) (HCC)    Coronary artery disease    a. s/p remote PCI x 5;  b. 2006 s/p CABG x 3 ([place], [place] - [facility]); b. 05/2016 MV: attenuation corrected images w/o ischemia or wma-->Med rx; c. 06/2020 MV: EF 55-65%, no isch/infart-->low risk.   Depression    Diabetes mellitus without complication (HCC)    Essential hypertension 06/30/2016   GERD (gastroesophageal reflux disease)    Heart attack (HCC)    Total of 3 per pt.   Hyperlipidemia LDL goal <55    Hypertensive urgency 06/03/2015   PAD (peripheral artery disease) (HCC)    a. 06/2019 Angio: R AT (PTA), R POP (PTA & Viabahn stenting); b. 09/2020 ABIs: R 1.24, L 1.35.   Palpitations    a. 06/2020 Zio: Sinus brady, 54 (40-143), 4 beats NSVT, 3 SVT runs (max 143 bpm x 4 beats).   Seizure (HCC)      Assessment and Plan: 1. Bilateral sciatica   2. Lumbar spondylosis with myelopathy   3. DDD (degenerative disc disease), lumbar   4. Chronic pain syndrome   5. Chronic, continuous use of opioids   6. Spinal stenosis of lumbar region with neurogenic claudication   7. Facet arthritis of lumbosacral region   8. Morbid obesity (HCC)   9. Cervicogenic headache   Despite multiple attempts I was unable to get in touch with Tina Hayes.  I have reviewed the [facility] practitioner database information we will go ahead and refill for 1 month with a reschedule for appointment in 1 month.  Follow Up Instructions:    I discussed the assessment and treatment plan with the patient. The patient was provided an opportunity to ask questions and all were answered. The patient  agreed with the plan and demonstrated an understanding of the instructions.   The patient was advised to call back or seek an in-person evaluation if the symptoms worsen or if the condition fails to improve as anticipated.  I provided 15 minutes of non-face-to-face time during this encounter.   Yevette Edwards, MD

## 2022-11-19 NOTE — Telephone Encounter (Signed)
Order faxed to [facility]. Fax confirmation sent to scan.

## 2022-12-10 ENCOUNTER — Emergency Department: Payer: Medicare PPO

## 2022-12-10 ENCOUNTER — Other Ambulatory Visit: Payer: Self-pay

## 2022-12-10 ENCOUNTER — Emergency Department
Admission: EM | Admit: 2022-12-10 | Discharge: 2022-12-11 | Disposition: A | Discharge status: Home / Self Care | Payer: Medicare PPO | Attending: Emergency Medicine | Admitting: Emergency Medicine

## 2022-12-10 DIAGNOSIS — E1165 Type 2 diabetes mellitus with hyperglycemia: Secondary | ICD-10-CM | POA: Diagnosis not present

## 2022-12-10 DIAGNOSIS — I10 Essential (primary) hypertension: Secondary | ICD-10-CM | POA: Diagnosis not present

## 2022-12-10 DIAGNOSIS — Z1152 Encounter for screening for COVID-19: Secondary | ICD-10-CM | POA: Diagnosis not present

## 2022-12-10 DIAGNOSIS — Z794 Long term (current) use of insulin: Secondary | ICD-10-CM | POA: Diagnosis not present

## 2022-12-10 DIAGNOSIS — R739 Hyperglycemia, unspecified: Secondary | ICD-10-CM

## 2022-12-10 DIAGNOSIS — J449 Chronic obstructive pulmonary disease, unspecified: Secondary | ICD-10-CM | POA: Diagnosis not present

## 2022-12-10 DIAGNOSIS — E119 Type 2 diabetes mellitus without complications: Secondary | ICD-10-CM

## 2022-12-10 LAB — BLOOD GAS, VENOUS
Acid-Base Excess: 0.6 mmol/L (ref 0.0–2.0)
Bicarbonate: 26.6 mmol/L (ref 20.0–28.0)
O2 Saturation: 66.2 %
Patient temperature: 37
pCO2, Ven: 47 mmHg (ref 44–60)
pH, Ven: 7.36 (ref 7.25–7.43)
pO2, Ven: 38 mmHg (ref 32–45)

## 2022-12-10 LAB — COMPREHENSIVE METABOLIC PANEL
ALT: 22 U/L (ref 0–44)
AST: 22 U/L (ref 15–41)
Albumin: 4.1 g/dL (ref 3.5–5.0)
Alkaline Phosphatase: 177 U/L — ABNORMAL HIGH (ref 38–126)
Anion gap: 11 (ref 5–15)
BUN: 14 mg/dL (ref 8–23)
CO2: 23 mmol/L (ref 22–32)
Calcium: 9.3 mg/dL (ref 8.9–10.3)
Chloride: 98 mmol/L (ref 98–111)
Creatinine, Ser: 0.85 mg/dL (ref 0.44–1.00)
GFR, Estimated: >60 mL/min (ref >60)
Glucose, Bld: 570 mg/dL (ref 70–99)
Potassium: 3.9 mmol/L (ref 3.5–5.1)
Sodium: 132 mmol/L — ABNORMAL LOW (ref 135–145)
Total Bilirubin: 0.4 mg/dL (ref 0.3–1.2)
Total Protein: 7.8 g/dL (ref 6.5–8.1)

## 2022-12-10 LAB — CBC WITH DIFFERENTIAL/PLATELET
Abs Immature Granulocytes: 0.02 10*3/uL (ref 0.00–0.07)
Basophils Absolute: 0 10*3/uL (ref 0.0–0.1)
Basophils Relative: 1 %
Eosinophils Absolute: 0.1 10*3/uL (ref 0.0–0.5)
Eosinophils Relative: 3 %
HCT: 36 % (ref 36.0–46.0)
Hemoglobin: 11 g/dL — ABNORMAL LOW (ref 12.0–15.0)
Immature Granulocytes: 0 %
Lymphocytes Relative: 30 %
Lymphs Abs: 1.5 10*3/uL (ref 0.7–4.0)
MCH: 25.6 pg — ABNORMAL LOW (ref 26.0–34.0)
MCHC: 30.6 g/dL (ref 30.0–36.0)
MCV: 83.9 fL (ref 80.0–100.0)
Monocytes Absolute: 0.4 10*3/uL (ref 0.1–1.0)
Monocytes Relative: 7 %
Neutro Abs: 2.9 10*3/uL (ref 1.7–7.7)
Neutrophils Relative %: 59 %
Platelets: 210 10*3/uL (ref 150–400)
RBC: 4.29 MIL/uL (ref 3.87–5.11)
RDW: 14.4 % (ref 11.5–15.5)
WBC: 4.9 10*3/uL (ref 4.0–10.5)
nRBC: 0 % (ref 0.0–0.2)

## 2022-12-10 LAB — CBG MONITORING, ED
Glucose-Capillary: 550 mg/dL (ref 70–99)
Glucose-Capillary: 261 mg/dL — ABNORMAL HIGH (ref 70–99)
Glucose-Capillary: 412 mg/dL — ABNORMAL HIGH (ref 70–99)

## 2022-12-10 LAB — SARS CORONAVIRUS 2 BY RT PCR: SARS Coronavirus 2 by RT PCR: NEGATIVE

## 2022-12-10 LAB — BETA-HYDROXYBUTYRIC ACID: Beta-Hydroxybutyric Acid: 0.11 mmol/L (ref 0.05–0.27)

## 2022-12-10 MED ORDER — INSULIN ASPART 100 UNIT/ML FLEXPEN: PEN_INJECTOR | SUBCUTANEOUS | 0 refills | Status: AC

## 2022-12-10 MED ORDER — INSULIN ASPART 100 UNIT/ML IJ SOLN
8.0000 [IU] | Freq: Once | INTRAMUSCULAR | Status: AC
  Administered 2022-12-10: 8 [IU] via INTRAVENOUS
  Filled 2022-12-10: qty 1

## 2022-12-10 MED ORDER — SODIUM CHLORIDE 0.9 % IV BOLUS
1000.0000 mL | Freq: Once | INTRAVENOUS | Status: AC
  Administered 2022-12-10: 1000 mL via INTRAVENOUS

## 2022-12-10 NOTE — ED Notes (Signed)
Blankets provided. RT notified of VBG sent to lab for pick up.

## 2022-12-10 NOTE — ED Triage Notes (Signed)
Pt BIB [facility] for "hyperglycemia," pt c/o dizziness, nausea, falls, has not taken insulin x's 1W, also c/o bilateral rib pain and back pain.

## 2022-12-10 NOTE — ED Provider Notes (Signed)
[facility] Provider Note    Event Date/Time   First MD Initiated Contact with Patient 12/10/22 2050     (approximate)   History   Hyperglycemia   HPI  Tina Hayes is a 77 y.o. female  with h/o  HTN, HFpEF, COPD, DM, here with hyperglycemia. Tp reports she has a continuous glucose monitor and normally only uses insulin "as needed." She says over the last week she has had progressively worsening hyperglycemia at home, to the point where she needed to se insulin today. It has been going from 100s to 300s to >500. Reports she has had fatigue, increased thirst. No known fever, chills. No med changes. No current pain.      Physical Exam   Triage Vital Signs: ED Triage Vitals  Encounter Vitals Group     BP 12/10/22 2100 (!) 166/69     Systolic BP Percentile --      Diastolic BP Percentile --      Pulse Rate 12/10/22 2100 (!) 58     Resp 12/10/22 2100 17     Temp 12/10/22 2052 98.8 F (37.1 C)     Temp Source 12/10/22 2052 Oral     SpO2 12/10/22 2105 98 %     Weight 12/10/22 2053 273 lb 6.4 oz (124 kg)     Height 12/10/22 2053 5\' 11"  (1.803 m)     Head Circumference --      Peak Flow --      Pain Score 12/10/22 2053 9     Pain Loc --      Pain Education --      Exclude from Growth Chart --     Most recent vital signs: Vitals:   12/10/22 2330 12/11/22 0038  BP: (!) 206/78 (!) 138/94  Pulse: 60 62  Resp: 17 20  Temp:    SpO2: 99%      General: Awake, no distress.  CV:  Good peripheral perfusion. RRR. Resp:  Normal work of breathing. Lungs clear. Abd:  No distention. No tenderness. No rebound or guarding. Other:  Mildly dry MM.   ED Results / Procedures / Treatments   Labs (all labs ordered are listed, but only abnormal results are displayed) Labs Reviewed  CBC WITH DIFFERENTIAL/PLATELET - Abnormal; Notable for the following components:      Result Value   Hemoglobin 11.0 (*)    MCH 25.6 (*)    All other components within  normal limits  COMPREHENSIVE METABOLIC PANEL - Abnormal; Notable for the following components:   Sodium 132 (*)    Glucose, Bld 570 (*)    Alkaline Phosphatase 177 (*)    All other components within normal limits  CBG MONITORING, ED - Abnormal; Notable for the following components:   Glucose-Capillary 550 (*)    All other components within normal limits  CBG MONITORING, ED - Abnormal; Notable for the following components:   Glucose-Capillary 412 (*)    All other components within normal limits  CBG MONITORING, ED - Abnormal; Notable for the following components:   Glucose-Capillary 261 (*)    All other components within normal limits  SARS CORONAVIRUS 2 BY RT PCR  BLOOD GAS, VENOUS  BETA-HYDROXYBUTYRIC ACID  URINALYSIS, ROUTINE W REFLEX MICROSCOPIC  CBG MONITORING, ED     EKG    RADIOLOGY CXR: Clear   I also independently reviewed and agree with radiologist interpretations.   PROCEDURES:  Critical Care performed: No   MEDICATIONS ORDERED IN  ED: Medications  sodium chloride 0.9 % bolus 1,000 mL (0 mLs Intravenous Stopped 12/11/22 0037)  insulin aspart (novoLOG) injection 8 Units (8 Units Intravenous Given 12/10/22 2240)     IMPRESSION / MDM / ASSESSMENT AND PLAN / ED COURSE  I reviewed the triage vital signs and the nursing notes.                              Differential diagnosis includes, but is not limited to, hyperglycemia from worsening DM, dietary indiscretion, occult infection, renal failure  Patient's presentation is most consistent with acute presentation with potential threat to life or bodily function.  The patient is on the cardiac monitor to evaluate for evidence of arrhythmia and/or significant heart rate changes  77 yo F here with hyperglycemia, unclear trigger. Pt has h/o labile DM and recently was switched to just PRN lantus, which I think is reason for her hyperglycemia. No apparent infectious or ischemic triggers. Labs overall very reassuring.   CXR clear. VBG shows normal pH and CMP is without any evidence of DKA - CO2 normal, AG 11. Glu 570 but pt is mentating well, o/w well appearing. BHB negative. Pt given fluids, insulin with significant improvement.  Suspect hyperglycemia, likely related to recent med changes. We had a long discussion re: medications. She was previously on aspart 10u TID but was having issues with hypoglycemia. Will have her use aspart only PRN BG>300 and call her PCP as she likely needs a more regular/scheduled regimen. Otherwise, no apparent emergent pathology and she is asymptomatic now.    FINAL CLINICAL IMPRESSION(S) / ED DIAGNOSES   Final diagnoses:  Hyperglycemia  Type 2 diabetes mellitus without complication, with long-term current use of insulin (HCC)     Rx / DC Orders   ED Discharge Orders          Ordered    insulin aspart (NOVOLOG) 100 UNIT/ML FlexPen        12/10/22 2305    DME Bedside commode        12/10/22 2308             Note:  This document was prepared using Dragon voice recognition software and may include unintentional dictation errors.   Shaune Pollack, MD 12/11/22 [phone]

## 2022-12-10 NOTE — Discharge Instructions (Addendum)
CALL YOUR DOCTOR TOMORROW MORNING. IT IS VERY IMPORTANT TO DISCUSS YOUR ER VISIT AND MEDICATION ADJUSTMENTS.  FOR NOW, CONTINUE YOUR MONITORING AND LANTUS AS USUAL  IF YOUR BLOOD SUGAR IS CONSISTENTLY OVER 300 DESPITE YOUR USUAL REGIMEN, ADMINISTER 8 UNITS OF INSULIN ASPART (SHORT ACTING)  YOU CAN REPEAT THIS EVERY 8 HOURS UNTIL SUGARS HAVE IMPROVED  CALL YOUR DOCTOR TO DISCUSS THIS AS I SUSPECT YOU WILL NEED A HIGHER DOSE OF THIS FOR LONGER

## 2022-12-11 ENCOUNTER — Ambulatory Visit: Payer: Self-pay

## 2022-12-11 NOTE — Telephone Encounter (Signed)
     Chief Complaint: Blood sugar 380. Seen in ED last night. Symptoms: None Frequency: Today Pertinent Negatives: Patient denies any symptoms Disposition: [] ED /[] Urgent Care (no appt availability in office) / [] Appointment(In office/virtual)/ []  [facility]/ [] Home Care/ [] Refused Recommended Disposition /[] [facility]/ [x]  Follow-up with PCP Additional Notes: Pt. Will call her PCP.  Reason for Disposition  [1] Blood glucose > 300 mg/dL (27.2 mmol/L) AND [5] two or more times in a row  Answer Assessment - Initial Assessment Questions 1. BLOOD GLUCOSE: "What is your blood glucose level?"      380 2. ONSET: "When did you check the blood glucose?"     Today 3. USUAL RANGE: "What is your glucose level usually?" (e.g., usual fasting morning value, usual evening value)     It was 500 in ED last night 4. KETONES: "Do you check for ketones (urine or blood test strips)?" If Yes, ask: "What does the test show now?"      No 5. TYPE 1 or 2:  "Do you know what type of diabetes you have?"  (e.g., Type 1, Type 2, Gestational; doesn't know)      Type 1  6. INSULIN: "Do you take insulin?" "What type of insulin(s) do you use? What is the mode of delivery? (syringe, pen; injection or pump)?"      Yes 7. DIABETES PILLS: "Do you take any pills for your diabetes?" If Yes, ask: "Have you missed taking any pills recently?"     N/a 8. OTHER SYMPTOMS: "Do you have any symptoms?" (e.g., fever, frequent urination, difficulty breathing, dizziness, weakness, vomiting)     No 9. PREGNANCY: "Is there any chance you are pregnant?" "When was your last menstrual period?"     No  Protocols used: Diabetes - High Blood Sugar-A-AH

## 2023-01-01 ENCOUNTER — Ambulatory Visit: Payer: Medicare PPO | Admitting: Anesthesiology

## 2023-01-02 ENCOUNTER — Inpatient Hospital Stay (HOSPITAL_BASED_OUTPATIENT_CLINIC_OR_DEPARTMENT_OTHER): Payer: Medicare PPO | Admitting: Anesthesiology

## 2023-01-02 ENCOUNTER — Other Ambulatory Visit: Payer: Self-pay

## 2023-01-02 ENCOUNTER — Encounter: Payer: Self-pay | Admitting: Emergency Medicine

## 2023-01-02 ENCOUNTER — Inpatient Hospital Stay
Admission: EM | Admit: 2023-01-02 | Discharge: 2023-01-05 | DRG: 378 | Disposition: A | Discharge status: Home / Self Care | Payer: Medicare PPO | Attending: Internal Medicine | Admitting: Internal Medicine

## 2023-01-02 DIAGNOSIS — E119 Type 2 diabetes mellitus without complications: Secondary | ICD-10-CM

## 2023-01-02 DIAGNOSIS — I110 Hypertensive heart disease with heart failure: Secondary | ICD-10-CM | POA: Diagnosis present

## 2023-01-02 DIAGNOSIS — Z9861 Coronary angioplasty status: Secondary | ICD-10-CM

## 2023-01-02 DIAGNOSIS — Z79899 Other long term (current) drug therapy: Secondary | ICD-10-CM

## 2023-01-02 DIAGNOSIS — K449 Diaphragmatic hernia without obstruction or gangrene: Secondary | ICD-10-CM | POA: Diagnosis present

## 2023-01-02 DIAGNOSIS — T17308A Unspecified foreign body in larynx causing other injury, initial encounter: Secondary | ICD-10-CM | POA: Insufficient documentation

## 2023-01-02 DIAGNOSIS — M51369 Other intervertebral disc degeneration, lumbar region without mention of lumbar back pain or lower extremity pain: Secondary | ICD-10-CM

## 2023-01-02 DIAGNOSIS — M4716 Other spondylosis with myelopathy, lumbar region: Secondary | ICD-10-CM

## 2023-01-02 DIAGNOSIS — K922 Gastrointestinal hemorrhage, unspecified: Principal | ICD-10-CM | POA: Diagnosis present

## 2023-01-02 DIAGNOSIS — Z794 Long term (current) use of insulin: Secondary | ICD-10-CM

## 2023-01-02 DIAGNOSIS — I5030 Unspecified diastolic (congestive) heart failure: Secondary | ICD-10-CM | POA: Insufficient documentation

## 2023-01-02 DIAGNOSIS — Z96652 Presence of left artificial knee joint: Secondary | ICD-10-CM | POA: Diagnosis present

## 2023-01-02 DIAGNOSIS — Z961 Presence of intraocular lens: Secondary | ICD-10-CM | POA: Diagnosis present

## 2023-01-02 DIAGNOSIS — K219 Gastro-esophageal reflux disease without esophagitis: Secondary | ICD-10-CM | POA: Diagnosis present

## 2023-01-02 DIAGNOSIS — Z818 Family history of other mental and behavioral disorders: Secondary | ICD-10-CM

## 2023-01-02 DIAGNOSIS — E785 Hyperlipidemia, unspecified: Secondary | ICD-10-CM | POA: Diagnosis present

## 2023-01-02 DIAGNOSIS — Z9842 Cataract extraction status, left eye: Secondary | ICD-10-CM

## 2023-01-02 DIAGNOSIS — K254 Chronic or unspecified gastric ulcer with hemorrhage: Secondary | ICD-10-CM | POA: Diagnosis present

## 2023-01-02 DIAGNOSIS — G894 Chronic pain syndrome: Secondary | ICD-10-CM

## 2023-01-02 DIAGNOSIS — M5136 Other intervertebral disc degeneration, lumbar region: Secondary | ICD-10-CM

## 2023-01-02 DIAGNOSIS — Z87891 Personal history of nicotine dependence: Secondary | ICD-10-CM | POA: Diagnosis not present

## 2023-01-02 DIAGNOSIS — K259 Gastric ulcer, unspecified as acute or chronic, without hemorrhage or perforation: Secondary | ICD-10-CM

## 2023-01-02 DIAGNOSIS — Q399 Congenital malformation of esophagus, unspecified: Secondary | ICD-10-CM

## 2023-01-02 DIAGNOSIS — F419 Anxiety disorder, unspecified: Secondary | ICD-10-CM | POA: Diagnosis present

## 2023-01-02 DIAGNOSIS — Y92239 Unspecified place in hospital as the place of occurrence of the external cause: Secondary | ICD-10-CM | POA: Diagnosis not present

## 2023-01-02 DIAGNOSIS — I252 Old myocardial infarction: Secondary | ICD-10-CM

## 2023-01-02 DIAGNOSIS — K921 Melena: Secondary | ICD-10-CM | POA: Diagnosis not present

## 2023-01-02 DIAGNOSIS — J449 Chronic obstructive pulmonary disease, unspecified: Secondary | ICD-10-CM | POA: Diagnosis present

## 2023-01-02 DIAGNOSIS — I2510 Atherosclerotic heart disease of native coronary artery without angina pectoris: Secondary | ICD-10-CM | POA: Diagnosis present

## 2023-01-02 DIAGNOSIS — I739 Peripheral vascular disease, unspecified: Secondary | ICD-10-CM | POA: Diagnosis not present

## 2023-01-02 DIAGNOSIS — M5432 Sciatica, left side: Secondary | ICD-10-CM

## 2023-01-02 DIAGNOSIS — K224 Dyskinesia of esophagus: Secondary | ICD-10-CM | POA: Diagnosis present

## 2023-01-02 DIAGNOSIS — R569 Unspecified convulsions: Secondary | ICD-10-CM | POA: Diagnosis present

## 2023-01-02 DIAGNOSIS — I5032 Chronic diastolic (congestive) heart failure: Secondary | ICD-10-CM | POA: Diagnosis present

## 2023-01-02 DIAGNOSIS — F32A Depression, unspecified: Secondary | ICD-10-CM | POA: Diagnosis present

## 2023-01-02 DIAGNOSIS — D62 Acute posthemorrhagic anemia: Secondary | ICD-10-CM | POA: Diagnosis present

## 2023-01-02 DIAGNOSIS — R001 Bradycardia, unspecified: Secondary | ICD-10-CM | POA: Diagnosis present

## 2023-01-02 DIAGNOSIS — E669 Obesity, unspecified: Secondary | ICD-10-CM | POA: Diagnosis present

## 2023-01-02 DIAGNOSIS — M47817 Spondylosis without myelopathy or radiculopathy, lumbosacral region: Secondary | ICD-10-CM

## 2023-01-02 DIAGNOSIS — Z23 Encounter for immunization: Secondary | ICD-10-CM | POA: Diagnosis present

## 2023-01-02 DIAGNOSIS — I10 Essential (primary) hypertension: Secondary | ICD-10-CM | POA: Diagnosis present

## 2023-01-02 DIAGNOSIS — K257 Chronic gastric ulcer without hemorrhage or perforation: Secondary | ICD-10-CM | POA: Diagnosis not present

## 2023-01-02 DIAGNOSIS — Z9841 Cataract extraction status, right eye: Secondary | ICD-10-CM

## 2023-01-02 DIAGNOSIS — K920 Hematemesis: Secondary | ICD-10-CM | POA: Diagnosis not present

## 2023-01-02 DIAGNOSIS — Z8249 Family history of ischemic heart disease and other diseases of the circulatory system: Secondary | ICD-10-CM

## 2023-01-02 DIAGNOSIS — Z951 Presence of aortocoronary bypass graft: Secondary | ICD-10-CM

## 2023-01-02 DIAGNOSIS — R1319 Other dysphagia: Secondary | ICD-10-CM | POA: Diagnosis not present

## 2023-01-02 DIAGNOSIS — E1151 Type 2 diabetes mellitus with diabetic peripheral angiopathy without gangrene: Secondary | ICD-10-CM | POA: Diagnosis present

## 2023-01-02 DIAGNOSIS — Z6839 Body mass index (BMI) 39.0-39.9, adult: Secondary | ICD-10-CM

## 2023-01-02 DIAGNOSIS — Z7902 Long term (current) use of antithrombotics/antiplatelets: Secondary | ICD-10-CM

## 2023-01-02 DIAGNOSIS — Z886 Allergy status to analgesic agent status: Secondary | ICD-10-CM

## 2023-01-02 DIAGNOSIS — T17928A Food in respiratory tract, part unspecified causing other injury, initial encounter: Secondary | ICD-10-CM | POA: Diagnosis not present

## 2023-01-02 DIAGNOSIS — D649 Anemia, unspecified: Secondary | ICD-10-CM | POA: Diagnosis present

## 2023-01-02 DIAGNOSIS — F1190 Opioid use, unspecified, uncomplicated: Secondary | ICD-10-CM

## 2023-01-02 DIAGNOSIS — Z833 Family history of diabetes mellitus: Secondary | ICD-10-CM

## 2023-01-02 DIAGNOSIS — I6509 Occlusion and stenosis of unspecified vertebral artery: Secondary | ICD-10-CM | POA: Diagnosis present

## 2023-01-02 DIAGNOSIS — E1165 Type 2 diabetes mellitus with hyperglycemia: Secondary | ICD-10-CM | POA: Diagnosis present

## 2023-01-02 DIAGNOSIS — K253 Acute gastric ulcer without hemorrhage or perforation: Secondary | ICD-10-CM | POA: Diagnosis not present

## 2023-01-02 DIAGNOSIS — Z90710 Acquired absence of both cervix and uterus: Secondary | ICD-10-CM

## 2023-01-02 DIAGNOSIS — M5431 Sciatica, right side: Secondary | ICD-10-CM

## 2023-01-02 DIAGNOSIS — M48062 Spinal stenosis, lumbar region with neurogenic claudication: Secondary | ICD-10-CM

## 2023-01-02 DIAGNOSIS — Z823 Family history of stroke: Secondary | ICD-10-CM

## 2023-01-02 LAB — CBC WITH DIFFERENTIAL/PLATELET
Abs Immature Granulocytes: 0.02 10*3/uL (ref 0.00–0.07)
Basophils Absolute: 0.1 10*3/uL (ref 0.0–0.1)
Basophils Relative: 1 %
Eosinophils Absolute: 0.2 10*3/uL (ref 0.0–0.5)
Eosinophils Relative: 3 %
HCT: 35.7 % — ABNORMAL LOW (ref 36.0–46.0)
Hemoglobin: 10.5 g/dL — ABNORMAL LOW (ref 12.0–15.0)
Immature Granulocytes: 0 %
Lymphocytes Relative: 25 %
Lymphs Abs: 1.5 10*3/uL (ref 0.7–4.0)
MCH: 25.5 pg — ABNORMAL LOW (ref 26.0–34.0)
MCHC: 29.4 g/dL — ABNORMAL LOW (ref 30.0–36.0)
MCV: 86.7 fL (ref 80.0–100.0)
Monocytes Absolute: 0.4 10*3/uL (ref 0.1–1.0)
Monocytes Relative: 6 %
Neutro Abs: 3.9 10*3/uL (ref 1.7–7.7)
Neutrophils Relative %: 65 %
Platelets: 250 10*3/uL (ref 150–400)
RBC: 4.12 MIL/uL (ref 3.87–5.11)
RDW: 15.3 % (ref 11.5–15.5)
WBC: 6 10*3/uL (ref 4.0–10.5)
nRBC: 0 % (ref 0.0–0.2)

## 2023-01-02 LAB — COMPREHENSIVE METABOLIC PANEL
ALT: 17 U/L (ref 0–44)
AST: 22 U/L (ref 15–41)
Albumin: 3.8 g/dL (ref 3.5–5.0)
Alkaline Phosphatase: 142 U/L — ABNORMAL HIGH (ref 38–126)
Anion gap: 9 (ref 5–15)
BUN: 22 mg/dL (ref 8–23)
CO2: 27 mmol/L (ref 22–32)
Calcium: 9.2 mg/dL (ref 8.9–10.3)
Chloride: 106 mmol/L (ref 98–111)
Creatinine, Ser: 0.73 mg/dL (ref 0.44–1.00)
GFR, Estimated: >60 mL/min (ref >60)
Glucose, Bld: 207 mg/dL — ABNORMAL HIGH (ref 70–99)
Potassium: 3.8 mmol/L (ref 3.5–5.1)
Sodium: 142 mmol/L (ref 135–145)
Total Bilirubin: 0.3 mg/dL (ref 0.3–1.2)
Total Protein: 7.2 g/dL (ref 6.5–8.1)

## 2023-01-02 LAB — TYPE AND SCREEN
ABO/RH(D): O POS
Antibody Screen: NEGATIVE

## 2023-01-02 LAB — GLUCOSE, CAPILLARY
Glucose-Capillary: 172 mg/dL — ABNORMAL HIGH (ref 70–99)
Glucose-Capillary: 126 mg/dL — ABNORMAL HIGH (ref 70–99)
Glucose-Capillary: 218 mg/dL — ABNORMAL HIGH (ref 70–99)

## 2023-01-02 LAB — HEMOGLOBIN AND HEMATOCRIT, BLOOD
HCT: 28.7 % — ABNORMAL LOW (ref 36.0–46.0)
HCT: 29.7 % — ABNORMAL LOW (ref 36.0–46.0)
HCT: 28 % — ABNORMAL LOW (ref 36.0–46.0)
Hemoglobin: 8.8 g/dL — ABNORMAL LOW (ref 12.0–15.0)
Hemoglobin: 8.8 g/dL — ABNORMAL LOW (ref 12.0–15.0)
Hemoglobin: 8.4 g/dL — ABNORMAL LOW (ref 12.0–15.0)

## 2023-01-02 LAB — HEMOGLOBIN A1C
Hgb A1c MFr Bld: 8.7 % — ABNORMAL HIGH (ref 4.8–5.6)
Mean Plasma Glucose: 202.99 mg/dL

## 2023-01-02 LAB — PROTIME-INR
INR: 1 (ref 0.8–1.2)
Prothrombin Time: 12.9 s (ref 11.4–15.2)

## 2023-01-02 LAB — MAGNESIUM: Magnesium: 2.1 mg/dL (ref 1.7–2.4)

## 2023-01-02 LAB — APTT: aPTT: 30 s (ref 24–36)

## 2023-01-02 LAB — LIPASE, BLOOD: Lipase: 25 U/L (ref 11–51)

## 2023-01-02 MED ORDER — MORPHINE SULFATE (PF) 2 MG/ML IV SOLN
2.0000 mg | INTRAVENOUS | Status: DC | PRN
  Administered 2023-01-02 – 2023-01-03 (×2): 2 mg via INTRAVENOUS
  Filled 2023-01-02 (×3): qty 1

## 2023-01-02 MED ORDER — ONDANSETRON HCL 4 MG/2ML IJ SOLN
4.0000 mg | Freq: Once | INTRAMUSCULAR | Status: AC
  Administered 2023-01-02: 4 mg via INTRAVENOUS
  Filled 2023-01-02: qty 2

## 2023-01-02 MED ORDER — LEVETIRACETAM 750 MG PO TABS: 750.0000 mg | ORAL_TABLET | Freq: Two times a day (BID) | ORAL | Status: DC

## 2023-01-02 MED ORDER — LEVETIRACETAM 500 MG PO TABS
1500.0000 mg | ORAL_TABLET | Freq: Two times a day (BID) | ORAL | Status: DC
  Administered 2023-01-02 – 2023-01-05 (×7): 1500 mg via ORAL
  Filled 2023-01-02 (×7): qty 3

## 2023-01-02 MED ORDER — TIOTROPIUM BROMIDE MONOHYDRATE 18 MCG IN CAPS
18.0000 ug | ORAL_CAPSULE | Freq: Every day | RESPIRATORY_TRACT | Status: DC
  Administered 2023-01-02 – 2023-01-05 (×4): 18 ug via RESPIRATORY_TRACT
  Filled 2023-01-02: qty 5

## 2023-01-02 MED ORDER — PANTOPRAZOLE 80MG IVPB - SIMPLE MED
80.0000 mg | Freq: Once | INTRAVENOUS | Status: AC
  Administered 2023-01-02: 80 mg via INTRAVENOUS
  Filled 2023-01-02: qty 100

## 2023-01-02 MED ORDER — PANTOPRAZOLE SODIUM 40 MG IV SOLR: 40.0000 mg | Freq: Two times a day (BID) | INTRAVENOUS | Status: DC

## 2023-01-02 MED ORDER — PANTOPRAZOLE INFUSION (NEW) - SIMPLE MED
8.0000 mg/h | INTRAVENOUS | Status: AC
  Administered 2023-01-02 – 2023-01-05 (×10): 8 mg/h via INTRAVENOUS
  Filled 2023-01-02 (×8): qty 100

## 2023-01-02 MED ORDER — INFLUENZA VAC A&B SURF ANT ADJ 0.5 ML IM SUSY
0.5000 mL | PREFILLED_SYRINGE | INTRAMUSCULAR | Status: AC
  Administered 2023-01-03: 0.5 mL via INTRAMUSCULAR
  Filled 2023-01-02: qty 0.5

## 2023-01-02 MED ORDER — DIVALPROEX SODIUM 250 MG PO DR TAB
250.0000 mg | DELAYED_RELEASE_TABLET | Freq: Two times a day (BID) | ORAL | Status: DC
  Administered 2023-01-02 – 2023-01-05 (×7): 250 mg via ORAL
  Filled 2023-01-02 (×7): qty 1

## 2023-01-02 MED ORDER — INSULIN ASPART 100 UNIT/ML IJ SOLN
0.0000 [IU] | Freq: Three times a day (TID) | INTRAMUSCULAR | Status: DC
  Administered 2023-01-02: 3 [IU] via SUBCUTANEOUS
  Administered 2023-01-02 – 2023-01-03 (×2): 2 [IU] via SUBCUTANEOUS
  Administered 2023-01-03: 11 [IU] via SUBCUTANEOUS
  Filled 2023-01-02 (×4): qty 1

## 2023-01-02 NOTE — H&P (Signed)
History and Physical    Patient: Tina Hayes [id number]:[id number] DOB: 10/15/1945 DOA: 01/02/2023 DOS: the patient was seen and examined on 01/02/2023 PCP: Gorden Harms, PA-C  Patient coming from: Home  Chief Complaint: No chief complaint on file.  HPI: Tina Hayes is a 77 y.o. female with medical history significant of HFpEF, COPD, CAD, type 2 diabetes, GERD, hyperlipidemia, peripheral artery disease on Plavix, hiatal hernia with history of [name] lesions presenting with upper GI bleeding.  Limited history in the setting patient with overall poor historian.  Per report, patient states she has had 3-4 episodes of bloody vomiting.  No chest pain or shortness of breath.  Positive nausea and mild abdominal pain.  1-2 episodes of black or bloody stools.  No hemiparesis or confusion.  Does take Plavix regularly, however patient is not fully clear about all of her medications.  No fevers or chills. Presented to the ER afebrile, hemodynamically stable.  Satting well on room air.  White count 6, hemoglobin 10.5, platelets 250, creatinine 0.73.  Glucose 207. Review of Systems: As mentioned in the history of present illness. All other systems reviewed and are negative. Past Medical History:  Diagnosis Date   (HFpEF) heart failure with preserved ejection fraction (HCC)    a. 05/2016 Echo: EF 60-65%, mild to mod LVH, Gr1 DD, mild MR, mildly dil LA, mod TR, mildly to mod increased PASP; b. 07/2020 Echo: EF 55-60%, no rwma, mild LVH, GrIDD, mildly dil LA, mild MR, mild-mod TR, mild-mod AoV sclerosis w/o stenosis.   Anxiety    Arthritis    Chronic back pain    COPD (chronic obstructive pulmonary disease) (HCC)    Coronary artery disease    a. s/p remote PCI x 5;  b. 2006 s/p CABG x 3 ([place], [place] - [facility]); b. 05/2016 MV: attenuation corrected images w/o ischemia or wma-->Med rx; c. 06/2020 MV: EF 55-65%, no isch/infart-->low risk.   Depression    Diabetes mellitus without complication  (HCC)    Essential hypertension 06/30/2016   GERD (gastroesophageal reflux disease)    Heart attack (HCC)    Total of 3 per pt.   Hyperlipidemia LDL goal <55    Hypertensive urgency 06/03/2015   PAD (peripheral artery disease) (HCC)    a. 06/2019 Angio: R AT (PTA), R POP (PTA & Viabahn stenting); b. 09/2020 ABIs: R 1.24, L 1.35.   Palpitations    a. 06/2020 Zio: Sinus brady, 54 (40-143), 4 beats NSVT, 3 SVT runs (max 143 bpm x 4 beats).   Seizure [facility])    Past Surgical History:  Procedure Laterality Date   ABDOMINAL HYSTERECTOMY     ANKLE SURGERY Right    CATARACT EXTRACTION W/ INTRAOCULAR LENS  IMPLANT, BILATERAL     COLONOSCOPY WITH PROPOFOL N/A 05/04/2017   Procedure: COLONOSCOPY WITH PROPOFOL;  Surgeon: Scot Jun, MD;  Location: [facility] ENDOSCOPY;  Service: Endoscopy;  Laterality: N/A;   CORONARY ANGIOPLASTY     CORONARY ARTERY BYPASS GRAFT     TRIPLE BYPASS   ESOPHAGOGASTRODUODENOSCOPY N/A 02/25/2021   Procedure: ESOPHAGOGASTRODUODENOSCOPY (EGD);  Surgeon: Toney Reil, MD;  Location: [facility] ENDOSCOPY;  Service: Gastroenterology;  Laterality: N/A;   ESOPHAGOGASTRODUODENOSCOPY (EGD) WITH PROPOFOL N/A 05/04/2017   Procedure: ESOPHAGOGASTRODUODENOSCOPY (EGD) WITH PROPOFOL;  Surgeon: Scot Jun, MD;  Location: [facility] ENDOSCOPY;  Service: Endoscopy;  Laterality: N/A;   EXCISION MASS LOWER EXTREMETIES Right 03/02/2018   Procedure: EXCISION SOFT TISSUE MASS FROM MEDIAL ASPECT OF RIGHT ANKLE;  Surgeon:  Poggi, Excell Seltzer, MD;  Location: [facility] ORS;  Service: Orthopedics;  Laterality: Right;   FRACTURE SURGERY     JOINT REPLACEMENT     right   KNEE SURGERY Right    LOWER EXTREMITY ANGIOGRAPHY Right 07/14/2019   Procedure: LOWER EXTREMITY ANGIOGRAPHY;  Surgeon: Annice Needy, MD;  Location: [facility] INVASIVE CV LAB;  Service: Cardiovascular;  Laterality: Right;   MOUTH SURGERY Left 07/14/2017   TOTAL KNEE ARTHROPLASTY Left 09/27/2019   Procedure: TOTAL KNEE ARTHROPLASTY;  Surgeon: Christena Flake, MD;  Location: [facility] ORS;  Service: Orthopedics;  Laterality: Left;   Social History:  reports that she quit smoking about 16 years ago. Her smoking use included cigarettes. She started smoking about 36 years ago. She has a 5 pack-year smoking history. She has never used smokeless tobacco. She reports that she does not drink alcohol and does not use drugs.  Allergies  Allergen Reactions   Aspirin Anaphylaxis, Shortness Of Breath and Swelling    LIPS AND THROAT SWELL, DIFFICULT TO BREATH    Family History  Problem Relation Age of Onset   Diabetes Other    Hypertension Other    Diabetes Mother    Heart failure Mother    Heart disease Mother    Heart attack Mother    Stroke Mother    Depression Mother    Hypertension Mother    Cancer Sister        brain   Hypertension Sister    Diabetes Brother    Hypertension Brother    Heart failure Sister    Heart attack Sister    SIDS Sister     Prior to Admission medications   Medication Sig Start Date End Date Taking? Authorizing Provider  acetaminophen (TYLENOL) 500 MG tablet Take 500 mg by mouth every 6 (six) hours as needed for headache, moderate pain or fever.    [provider]  albuterol (PROVENTIL HFA;VENTOLIN HFA) 108 (90 Base) MCG/ACT inhaler Inhale 2 puffs into the lungs every 6 (six) hours as needed for wheezing or shortness of breath. 08/30/15   Katha Hamming, MD  albuterol (PROVENTIL) (2.5 MG/3ML) 0.083% nebulizer solution Take 2.5 mg every 4 (four) hours as needed by nebulization for wheezing or shortness of breath.    [provider]  amitriptyline (ELAVIL) 25 MG tablet Take 25 mg by mouth at bedtime. 01/20/22   [provider]  amLODipine (NORVASC) 5 MG tablet Take 1 tablet by mouth once daily 09/23/22   Antonieta Iba, MD  atorvastatin (LIPITOR) 40 MG tablet Take 1 tablet (40 mg total) by mouth daily. 02/04/22   Antonieta Iba, MD  B-D UF III MINI PEN NEEDLES 31G X 5 MM MISC USE WITH  INSULIN PEN INJECTIONS TWICE DAILY 08/05/18   [provider]  carvedilol (COREG) 3.125 MG tablet Take 1 tablet (3.125 mg total) by mouth 2 (two) times daily with a meal. 07/21/22   Gollan, Tollie Pizza, MD  clopidogrel (PLAVIX) 75 MG tablet Take 1 tablet (75 mg total) by mouth daily. 05/19/19   Delma Freeze, FNP  divalproex (DEPAKOTE) 250 MG DR tablet Take 250 mg by mouth 2 (two) times daily. 07/28/22   [provider]  DUPIXENT 300 MG/2ML SOPN Inject into the skin. 12/30/21   [provider]  insulin aspart (NOVOLOG) 100 UNIT/ML FlexPen Inject 8 units subQ if blood sugar is consistently over 300 for more than 6 hours despite taking your other prescribed insulin. Repeat up to three  times a day (every 8 hours) until blood sugars improve. 12/10/22   Shaune Pollack, MD  iron polysaccharides (NIFEREX) 150 MG capsule Take 1 capsule (150 mg total) by mouth daily. 04/13/22   Marrion Coy, MD  isosorbide mononitrate (IMDUR) 30 MG 24 hr tablet Take 1 tablet (30 mg total) by mouth daily. 02/26/21   Delfino Lovett, MD  LANTUS SOLOSTAR 100 UNIT/ML Solostar Pen Inject 30-40 Units into the skin See admin instructions. Inject 30 units subcutaneously in the morning at 40 units subcutaneously at bedtime. 05/26/19   [provider]  levETIRAcetam (KEPPRA) 750 MG tablet Take 750 mg by mouth 2 (two) times daily.    [provider]  losartan (COZAAR) 100 MG tablet Take 1 tablet by mouth once daily 02/10/22   Antonieta Iba, MD  methocarbamol (ROBAXIN) 750 MG tablet TAKE 1 TABLET BY MOUTH EVERY 8 HOURS AS NEEDED FOR MUSCLE SPASM 11/29/21   Yevette Edwards, MD  montelukast (SINGULAIR) 10 MG tablet Take 10 mg by mouth daily. 02/02/22   [provider]  nitroGLYCERIN (NITROSTAT) 0.4 MG SL tablet DISSOLVE ONE TABLET UNDER THE TONGUE EVERY 5 MINUTES AS NEEDED FOR CHEST PAIN.  DO NOT EXCEED A TOTAL OF 3 DOSES IN 15 MINUTES 02/04/22   Gollan, Tollie Pizza, MD  omeprazole (PRILOSEC) 40 MG  capsule Take 1 capsule (40 mg total) by mouth 2 (two) times daily before a meal. 02/05/22   Vanga, Loel Dubonnet, MD  potassium chloride SA (KLOR-CON) 20 MEQ tablet Take 2 tablets (40 mEq total) by mouth 2 (two) times daily. Take extra 2 tablets (40 meq) if you take metolazone 12/13/19   Clarisa Kindred A, FNP  sertraline (ZOLOFT) 100 MG tablet Take 150 mg by mouth daily. 11/27/21   [provider]  sucralfate (CARAFATE) 1 GM/10ML suspension Take 10 mLs (1 g total) by mouth 4 (four) times daily. 02/05/22   Toney Reil, MD  tiotropium (SPIRIVA) 18 MCG inhalation capsule Place 18 mcg into inhaler and inhale daily.    [provider]  torsemide (DEMADEX) 20 MG tablet Take 1 tablet (20 mg) by mouth twice daily 08/03/20   Furth, Cadence H, PA-C  traZODone (DESYREL) 50 MG tablet Take 50 mg by mouth at bedtime.    [provider]    Physical Exam: Vitals:   01/02/23 0600 01/02/23 0630 01/02/23 0700 01/02/23 0730  BP: (!) 176/158 (!) 170/91 (!) 140/74 104/67  Pulse: 70 69 71 70  Resp: 18 20 (!) 21 19  Temp:      TempSrc:      SpO2: 97% 96% 93% 93%  Weight:      Height:       Physical Exam Constitutional:      Appearance: She is obese.  HENT:     Head: Normocephalic and atraumatic.     Nose: Nose normal.     Mouth/Throat:     Mouth: Mucous membranes are dry.  Cardiovascular:     Rate and Rhythm: Normal rate and regular rhythm.  Pulmonary:     Effort: Pulmonary effort is normal.  Abdominal:     General: Bowel sounds are normal.  Musculoskeletal:        General: Normal range of motion.  Skin:    General: Skin is warm.  Neurological:     General: No focal deficit present.  Psychiatric:        Mood and Affect: Mood normal.     Data Reviewed:  There are no  new results to review at this time.  Assessment and Plan: * UGIB (upper gastrointestinal bleed) Hematemesis x 3-4 over the past 2 days in the setting of Plavix use and baseline hiatal hernia on  November 2022 EGD Hemoglobin fairly stable at 10.5. Case discussed with Dr. Allegra Lai who will evaluate the patient IV PPI Trend hemoglobin Transfuse for hemoglobin less than 7 Follow-up GI recommendations   Coronary artery disease Stable at present  No active CP  Cont home regimen  Hold antiplatelet medications for now in setting of UGI bleeding    Cameron ulcer, acute 10/31 EGD w/ Normal duodenal bulb and second portion of the duodenum.8 cm hiatal hernia with a few Cameron ulcers  ?  Mali lesion flare in the setting of hematemesis and antiplatelet use with baseline hiatal hernia Follow-up GI recommendations  (HFpEF) heart failure with preserved ejection fraction (HCC) 2D echo April 2022 with EF of 55 to 60% grade 1 diastolic dysfunction Appears fairly euvolemic Monitor volume status with IV fluid hydration and PRBC transfusion as needed Follow  Type 2 diabetes mellitus without complications (HCC) SSI  Acute on chronic anemia Hemoglobin 10.5 in the setting of upper GI bleeding Appears at baseline Trend hemoglobin in the setting of GI bleeding Transfuse for hemoglobin less than 7 Monitor  Occlusion and stenosis of vertebral artery Cont statin  Hold plavix in setting of UGIB   Seizures (HCC) Stable Continue Keppra and Depakote  Essential hypertension BP stable Titrate home regimen  COPD (chronic obstructive pulmonary disease) (HCC) Stable from respiratory standpoint Continue home inhalers      Advance Care Planning:   Code Status: Full Code   Consults: GI- Vanga   Family Communication: No family at the bedside   Severity of Illness: The appropriate patient status for this patient is INPATIENT. Inpatient status is judged to be reasonable and necessary in order to provide the required intensity of service to ensure the patient's safety. The patient's presenting symptoms, physical exam findings, and initial radiographic and laboratory data in the context of  their chronic comorbidities is felt to place them at high risk for further clinical deterioration. Furthermore, it is not anticipated that the patient will be medically stable for discharge from the hospital within 2 midnights of admission.   * I certify that at the point of admission it is my clinical judgment that the patient will require inpatient hospital care spanning beyond 2 midnights from the point of admission due to high intensity of service, high risk for further deterioration and high frequency of surveillance required.*  Author: Floydene Flock, MD 01/02/2023 8:47 AM  For on call review [email or web address].

## 2023-01-02 NOTE — Consult Note (Signed)
Tina Repress, MD [address]  [address]  [place], [place] [address]  Main: [phone]  Fax: [phone] Pager: [phone]   Consultation  Referring Provider:     No ref. provider found Primary Care Physician:  Gorden Harms, PA-C Primary Gastroenterologist:  Dr. Lannette Donath         Reason for Consultation: Hematemesis  Date of Admission:  01/02/2023 Date of Consultation:  01/02/2023         HPI:   Tina Hayes is a 77 y.o. female history of chronic GERD, chronic iron deficiency anemia secondary to chronic blood loss from large hiatal hernia.  Known history of Cameron erosions and ulcers based on upper endoscopy in 01/2021.  She presented to ER earlier this morning because of hematemesis which was dark red in color started vomiting last night after dinner associated with black stools.  She also reports epigastric discomfort and heartburn. Has been taking Plavix.  Vitals were stable in the ER, labs revealed hemoglobin 10.5 on admission, 8.8 today.  Normal PT/INR, normal serum lipase, mildly elevated BUN compared to baseline.  Normal creatinine. GI is consulted to evaluate for hematemesis, melena Remains on the patient, she was lethargic, but arousable, denied any abdominal  NSAIDs: None  Antiplts/Anticoagulants/Anti thrombotics: Plavix for history of peripheral artery disease  GI Procedures:  Upper endoscopy 02/25/2021 - Normal duodenal bulb and second portion of the duodenum. - 8 cm hiatal hernia with a few Cameron ulcers. - Erythematous mucosa in the gastric body. Biopsied. - Normal incisura and antrum. Biopsied. - Normal gastroesophageal junction and esophagus. DIAGNOSIS:  A. STOMACH, RANDOM; COLD BIOPSY:  - NONSPECIFIC CHRONIC GASTRITIS, MINIMAL.  - NEGATIVE FOR ACTIVE INFLAMMATION AND H PYLORI.  - NEGATIVE FOR INTESTINAL METAPLASIA, DYSPLASIA, AND MALIGNANCY.      Colonoscopy 05/04/2017 - Diverticulosis in the sigmoid colon. - Internal hemorrhoids. -  The examination was otherwise normal. - No specimens collected.  Past Medical History:  Diagnosis Date   (HFpEF) heart failure with preserved ejection fraction (HCC)    a. 05/2016 Echo: EF 60-65%, mild to mod LVH, Gr1 DD, mild MR, mildly dil LA, mod TR, mildly to mod increased PASP; b. 07/2020 Echo: EF 55-60%, no rwma, mild LVH, GrIDD, mildly dil LA, mild MR, mild-mod TR, mild-mod AoV sclerosis w/o stenosis.   Anxiety    Arthritis    Chronic back pain    COPD (chronic obstructive pulmonary disease) (HCC)    Coronary artery disease    a. s/p remote PCI x 5;  b. 2006 s/p CABG x 3 ([place], [place] - [facility]); b. 05/2016 MV: attenuation corrected images w/o ischemia or wma-->Med rx; c. 06/2020 MV: EF 55-65%, no isch/infart-->low risk.   Depression    Diabetes mellitus without complication (HCC)    Essential hypertension 06/30/2016   GERD (gastroesophageal reflux disease)    Heart attack (HCC)    Total of 3 per pt.   Hyperlipidemia LDL goal <55    Hypertensive urgency 06/03/2015   PAD (peripheral artery disease) (HCC)    a. 06/2019 Angio: R AT (PTA), R POP (PTA & Viabahn stenting); b. 09/2020 ABIs: R 1.24, L 1.35.   Palpitations    a. 06/2020 Zio: Sinus brady, 54 (40-143), 4 beats NSVT, 3 SVT runs (max 143 bpm x 4 beats).   Seizure [facility])     Past Surgical History:  Procedure Laterality Date   ABDOMINAL HYSTERECTOMY     ANKLE SURGERY Right    CATARACT  EXTRACTION W/ INTRAOCULAR LENS  IMPLANT, BILATERAL     COLONOSCOPY WITH PROPOFOL N/A 05/04/2017   Procedure: COLONOSCOPY WITH PROPOFOL;  Surgeon: Scot Jun, MD;  Location: [facility] ENDOSCOPY;  Service: Endoscopy;  Laterality: N/A;   CORONARY ANGIOPLASTY     CORONARY ARTERY BYPASS GRAFT     TRIPLE BYPASS   ESOPHAGOGASTRODUODENOSCOPY N/A 02/25/2021   Procedure: ESOPHAGOGASTRODUODENOSCOPY (EGD);  Surgeon: Toney Reil, MD;  Location: [facility] ENDOSCOPY;  Service: Gastroenterology;  Laterality: N/A;    ESOPHAGOGASTRODUODENOSCOPY (EGD) WITH PROPOFOL N/A 05/04/2017   Procedure: ESOPHAGOGASTRODUODENOSCOPY (EGD) WITH PROPOFOL;  Surgeon: Scot Jun, MD;  Location: [facility] ENDOSCOPY;  Service: Endoscopy;  Laterality: N/A;   EXCISION MASS LOWER EXTREMETIES Right 03/02/2018   Procedure: EXCISION SOFT TISSUE MASS FROM MEDIAL ASPECT OF RIGHT ANKLE;  Surgeon: Christena Flake, MD;  Location: [facility] ORS;  Service: Orthopedics;  Laterality: Right;   FRACTURE SURGERY     JOINT REPLACEMENT     right   KNEE SURGERY Right    LOWER EXTREMITY ANGIOGRAPHY Right 07/14/2019   Procedure: LOWER EXTREMITY ANGIOGRAPHY;  Surgeon: Annice Needy, MD;  Location: [facility] INVASIVE CV LAB;  Service: Cardiovascular;  Laterality: Right;   MOUTH SURGERY Left 07/14/2017   TOTAL KNEE ARTHROPLASTY Left 09/27/2019   Procedure: TOTAL KNEE ARTHROPLASTY;  Surgeon: Christena Flake, MD;  Location: [facility] ORS;  Service: Orthopedics;  Laterality: Left;     Current Facility-Administered Medications:    divalproex (DEPAKOTE) DR tablet 250 mg, 250 mg, Oral, BID, Floydene Flock, MD, 250 mg at 01/02/23 1047   [START ON 01/03/2023] influenza vaccine adjuvanted (FLUAD) injection 0.5 mL, 0.5 mL, Intramuscular, Tomorrow-1000, Floydene Flock, MD   insulin aspart (novoLOG) injection 0-15 Units, 0-15 Units, Subcutaneous, TID WC, Floydene Flock, MD   levETIRAcetam (KEPPRA) tablet 1,500 mg, 1,500 mg, Oral, BID, Floydene Flock, MD, 1,500 mg at 01/02/23 1047   [START ON 01/05/2023] pantoprazole (PROTONIX) injection 40 mg, 40 mg, Intravenous, Q12H, Delton Prairie, MD   pantoprozole (PROTONIX) 80 mg /NS 100 mL infusion, 8 mg/hr, Intravenous, Continuous, Delton Prairie, MD, Last Rate: 10 mL/hr at 01/02/23 0841, 8 mg/hr at 01/02/23 0841   tiotropium (SPIRIVA) inhalation capsule ([facility] use ONLY) 18 mcg, 18 mcg, Inhalation, Daily, Floydene Flock, MD   Family History  Problem Relation Age of Onset   Diabetes Other    Hypertension Other    Diabetes Mother    Heart  failure Mother    Heart disease Mother    Heart attack Mother    Stroke Mother    Depression Mother    Hypertension Mother    Cancer Sister        brain   Hypertension Sister    Diabetes Brother    Hypertension Brother    Heart failure Sister    Heart attack Sister    SIDS Sister      Social History   Tobacco Use   Smoking status: Former    Current packs/day: 0.00    Average packs/day: 0.3 packs/day for 20.0 years (5.0 ttl pk-yrs)    Types: Cigarettes    Start date: 07/01/1986    Quit date: 07/01/2006    Years since quitting: 16.5   Smokeless tobacco: Never  Vaping Use   Vaping status: Never Used  Substance Use Topics   Alcohol use: No    Alcohol/week: 0.0 standard drinks of alcohol   Drug use: No    Allergies as of 01/02/2023 - Review Complete 01/02/2023  Allergen  Reaction Noted   Aspirin Anaphylaxis, Shortness Of Breath, and Swelling 04/14/2015    Review of Systems:    All systems reviewed and negative except where noted in HPI.   Physical Exam:  Vital signs in last 24 hours: Temp:  [98.6 F (37 C)] 98.6 F (37 C) (09/06 0532) Pulse Rate:  [64-71] 70 (09/06 0730) Resp:  [17-21] 19 (09/06 0730) BP: (104-183)/(67-158) 104/67 (09/06 0730) SpO2:  [93 %-97 %] 93 % (09/06 0730) Weight:  [161 kg] 124 kg (09/06 0539)   General: Drowsy, arousable, cooperative in NAD Head:  Normocephalic and atraumatic. Eyes:   No icterus.   Conjunctiva pink. PERRLA. Ears:  Normal auditory acuity. Neck:  Supple; no masses or thyroidomegaly Lungs: Respirations even and unlabored. Lungs clear to auscultation bilaterally.   No wheezes, crackles, or rhonchi.  Heart:  Regular rate and rhythm;  Without murmur, clicks, rubs or gallops Abdomen:  Soft, nondistended, nontender. Normal bowel sounds. No appreciable masses or hepatomegaly.  No rebound or guarding.  Rectal:  Not performed. Msk:  Symmetrical without gross deformities.  Strength generalized weakness Extremities:  Without edema,  cyanosis or clubbing. Neurologic:  Alert and oriented x3;  grossly normal neurologically. Skin:  Intact without significant lesions or rashes. Psych:  Alert and cooperative. Normal affect.  LAB RESULTS:    Latest Ref Rng & Units 01/02/2023    9:17 AM 01/02/2023    5:32 AM 12/10/2022    9:37 PM  CBC  WBC 4.0 - 10.5 K/uL  6.0  4.9   Hemoglobin 12.0 - 15.0 g/dL 8.8  09.6  04.5   Hematocrit 36.0 - 46.0 % 28.7  35.7  36.0   Platelets 150 - 400 K/uL  250  210     BMET    Latest Ref Rng & Units 01/02/2023    5:32 AM 12/10/2022    9:37 PM 04/11/2022    4:19 AM  BMP  Glucose 70 - 99 mg/dL 409  811  914   BUN 8 - 23 mg/dL 22  14  11    Creatinine 0.44 - 1.00 mg/dL 7.82  9.56  2.13   Sodium 135 - 145 mmol/L 142  132  140   Potassium 3.5 - 5.1 mmol/L 3.8  3.9  3.6   Chloride 98 - 111 mmol/L 106  98  109   CO2 22 - 32 mmol/L 27  23  25    Calcium 8.9 - 10.3 mg/dL 9.2  9.3  8.5     LFT    Latest Ref Rng & Units 01/02/2023    5:32 AM 12/10/2022    9:37 PM 08/09/2021    4:13 PM  Hepatic Function  Total Protein 6.5 - 8.1 g/dL 7.2  7.8  7.0   Albumin 3.5 - 5.0 g/dL 3.8  4.1  3.5   AST 15 - 41 U/L 22  22  22    ALT 0 - 44 U/L 17  22  15    Alk Phosphatase 38 - 126 U/L 142  177  95   Total Bilirubin 0.3 - 1.2 mg/dL 0.3  0.4  0.8      STUDIES: No results found.    Impression / Plan:   KYRIEL ZONG is a 77 y.o. female with history of coronary artery disease, peripheral artery disease on Plavix, large hiatal hernia with [name] ulcers, chronic iron deficiency anemia presented with 1 day history of hematemesis and melena with acute blood loss anemia  Acute blood loss anemia with hematemesis  and melena Patient has history of large hiatal hernia with [name] ulcers which is likely etiology Other differentials include erosive esophagitis or peptic ulcer disease Continue PPI drip Monitor CBC closely to maintain hemoglobin above 8 patient is is on Plavix as outpatient, held during this  admission.  Plavix needs to be held at least for 4 days before proceeding with upper endoscopy Resume diet Plan for upper endoscopy on Monday or as an outpatient if patient no longer bleeds and hemoglobin remains stable Continue long-term high-dose PPI 40 mg p.o. twice daily before meals Small meal at a time, strict antireflux measures Patient is also evaluated by general surgery for repair of hiatal hernia.  Given her BMI, patient is deemed not not a candidate for repair of paraesophageal hernia unless she develops complicated hernia   Thank you for involving me in the care of this patient.      LOS: 0 days   Lannette Donath, MD  01/02/2023, 1:07 PM    Note: This dictation was prepared with Dragon dictation along with smaller phrase technology. Any transcriptional errors that result from this process are unintentional.

## 2023-01-02 NOTE — Assessment & Plan Note (Signed)
Cont statin  Hold plavix in setting of UGIB

## 2023-01-02 NOTE — ED Notes (Signed)
ED Provider at bedside. 

## 2023-01-02 NOTE — Assessment & Plan Note (Signed)
SSI

## 2023-01-02 NOTE — Assessment & Plan Note (Signed)
2D echo April 2022 with EF of 55 to 60% grade 1 diastolic dysfunction Appears fairly euvolemic Monitor volume status with IV fluid hydration and PRBC transfusion as needed Follow

## 2023-01-02 NOTE — Assessment & Plan Note (Signed)
Stable Continue Keppra and Depakote

## 2023-01-02 NOTE — Assessment & Plan Note (Addendum)
10/31 EGD w/ Normal duodenal bulb and second portion of the duodenum.8 cm hiatal hernia with a few Cameron ulcers  ?  Mali lesion flare in the setting of hematemesis and antiplatelet use with baseline hiatal hernia Follow-up GI recommendations

## 2023-01-02 NOTE — ED Triage Notes (Signed)
Pt to ED via [facility] with c/o hematemesis. Per EMS symptoms began last night. Per EMS pt began vomiting last night after dinner, states it was dark red. Per EMS pt has hx of hiatal hernia.   129/84 CBG 239 98% RA  EMS reports pt also c/o some C/P.

## 2023-01-02 NOTE — Assessment & Plan Note (Signed)
Hemoglobin 10.5 in the setting of upper GI bleeding Appears at baseline Trend hemoglobin in the setting of GI bleeding Transfuse for hemoglobin less than 7 Monitor

## 2023-01-02 NOTE — Assessment & Plan Note (Signed)
BP stable Titrate home regimen 

## 2023-01-02 NOTE — Assessment & Plan Note (Signed)
Stable from respiratory standpoint Continue home inhalers 

## 2023-01-02 NOTE — Consult Note (Signed)
Patient ID: Tina Hayes, female   DOB: 29-Jan-1946, 77 y.o.   MRN: [id number]  HPI Tina Hayes is a 77 y.o. female in consultation at the request of Dr. Allegra Lai for a paraesophageal hernia with [name] ulcers.  She Does have multiple comorbidities to include COPD, coronary artery disease with prior history of stents.  CHF, chronic pain syndrome, diabetes, hypertension.  History of abdominal hysterectomy. Presented this time with multiple episodes of hematemesis and some melena.  Some chronic abdominal pain pain is intermittent diffuse dull and located mainly in the upper abdomen. Did have a CT scan from before that I personally reviewed showing evidence of a type B paraesophageal hernia moderate size about a third of the stomach within the mediastinum.  No evidence of volvulus or any complicating features.  labs revealed hemoglobin 10.5 on admission, 8.8 today. Normal PT/INR, normal serum lipase, mildly elevated BUN compared to baseline. Normal creatinine.  Also had an upper endoscopy in October 2022 showing evidence of an 8 cm hiatal hernia with [name] ulcers.  HPI  Past Medical History:  Diagnosis Date   (HFpEF) heart failure with preserved ejection fraction (HCC)    a. 05/2016 Echo: EF 60-65%, mild to mod LVH, Gr1 DD, mild MR, mildly dil LA, mod TR, mildly to mod increased PASP; b. 07/2020 Echo: EF 55-60%, no rwma, mild LVH, GrIDD, mildly dil LA, mild MR, mild-mod TR, mild-mod AoV sclerosis w/o stenosis.   Anxiety    Arthritis    Chronic back pain    COPD (chronic obstructive pulmonary disease) (HCC)    Coronary artery disease    a. s/p remote PCI x 5;  b. 2006 s/p CABG x 3 ([place], [place] - [facility]); b. 05/2016 MV: attenuation corrected images w/o ischemia or wma-->Med rx; c. 06/2020 MV: EF 55-65%, no isch/infart-->low risk.   Depression    Diabetes mellitus without complication (HCC)    Essential hypertension 06/30/2016   GERD (gastroesophageal reflux disease)    Heart  attack (HCC)    Total of 3 per pt.   Hyperlipidemia LDL goal <55    Hypertensive urgency 06/03/2015   PAD (peripheral artery disease) (HCC)    a. 06/2019 Angio: R AT (PTA), R POP (PTA & Viabahn stenting); b. 09/2020 ABIs: R 1.24, L 1.35.   Palpitations    a. 06/2020 Zio: Sinus brady, 54 (40-143), 4 beats NSVT, 3 SVT runs (max 143 bpm x 4 beats).   Seizure [facility])     Past Surgical History:  Procedure Laterality Date   ABDOMINAL HYSTERECTOMY     ANKLE SURGERY Right    CATARACT EXTRACTION W/ INTRAOCULAR LENS  IMPLANT, BILATERAL     COLONOSCOPY WITH PROPOFOL N/A 05/04/2017   Procedure: COLONOSCOPY WITH PROPOFOL;  Surgeon: Scot Jun, MD;  Location: [facility] ENDOSCOPY;  Service: Endoscopy;  Laterality: N/A;   CORONARY ANGIOPLASTY     CORONARY ARTERY BYPASS GRAFT     TRIPLE BYPASS   ESOPHAGOGASTRODUODENOSCOPY N/A 02/25/2021   Procedure: ESOPHAGOGASTRODUODENOSCOPY (EGD);  Surgeon: Toney Reil, MD;  Location: [facility] ENDOSCOPY;  Service: Gastroenterology;  Laterality: N/A;   ESOPHAGOGASTRODUODENOSCOPY (EGD) WITH PROPOFOL N/A 05/04/2017   Procedure: ESOPHAGOGASTRODUODENOSCOPY (EGD) WITH PROPOFOL;  Surgeon: Scot Jun, MD;  Location: [facility] ENDOSCOPY;  Service: Endoscopy;  Laterality: N/A;   EXCISION MASS LOWER EXTREMETIES Right 03/02/2018   Procedure: EXCISION SOFT TISSUE MASS FROM MEDIAL ASPECT OF RIGHT ANKLE;  Surgeon: Christena Flake, MD;  Location: [facility] ORS;  Service: Orthopedics;  Laterality: Right;  FRACTURE SURGERY     JOINT REPLACEMENT     right   KNEE SURGERY Right    LOWER EXTREMITY ANGIOGRAPHY Right 07/14/2019   Procedure: LOWER EXTREMITY ANGIOGRAPHY;  Surgeon: Annice Needy, MD;  Location: [facility] INVASIVE CV LAB;  Service: Cardiovascular;  Laterality: Right;   MOUTH SURGERY Left 07/14/2017   TOTAL KNEE ARTHROPLASTY Left 09/27/2019   Procedure: TOTAL KNEE ARTHROPLASTY;  Surgeon: Christena Flake, MD;  Location: [facility] ORS;  Service: Orthopedics;  Laterality: Left;    Family History   Problem Relation Age of Onset   Diabetes Other    Hypertension Other    Diabetes Mother    Heart failure Mother    Heart disease Mother    Heart attack Mother    Stroke Mother    Depression Mother    Hypertension Mother    Cancer Sister        brain   Hypertension Sister    Diabetes Brother    Hypertension Brother    Heart failure Sister    Heart attack Sister    SIDS Sister     Social History Social History   Tobacco Use   Smoking status: Former    Current packs/day: 0.00    Average packs/day: 0.3 packs/day for 20.0 years (5.0 ttl pk-yrs)    Types: Cigarettes    Start date: 07/01/1986    Quit date: 07/01/2006    Years since quitting: 16.5   Smokeless tobacco: Never  Vaping Use   Vaping status: Never Used  Substance Use Topics   Alcohol use: No    Alcohol/week: 0.0 standard drinks of alcohol   Drug use: No    Allergies  Allergen Reactions   Aspirin Anaphylaxis, Shortness Of Breath and Swelling    LIPS AND THROAT SWELL, DIFFICULT TO BREATH    Current Facility-Administered Medications  Medication Dose Route Frequency Provider Last Rate Last Admin   divalproex (DEPAKOTE) DR tablet 250 mg  250 mg Oral BID Floydene Flock, MD   250 mg at 01/02/23 1047   [START ON 01/03/2023] influenza vaccine adjuvanted (FLUAD) injection 0.5 mL  0.5 mL Intramuscular Tomorrow-1000 Floydene Flock, MD       insulin aspart (novoLOG) injection 0-15 Units  0-15 Units Subcutaneous TID WC Floydene Flock, MD   3 Units at 01/02/23 1320   levETIRAcetam (KEPPRA) tablet 1,500 mg  1,500 mg Oral BID Floydene Flock, MD   1,500 mg at 01/02/23 1047   [START ON 01/05/2023] pantoprazole (PROTONIX) injection 40 mg  40 mg Intravenous Q12H Delton Prairie, MD       pantoprozole (PROTONIX) 80 mg /NS 100 mL infusion  8 mg/hr Intravenous Continuous Delton Prairie, MD 10 mL/hr at 01/02/23 0841 8 mg/hr at 01/02/23 0841   tiotropium (SPIRIVA) inhalation capsule ([facility] use ONLY) 18 mcg  18 mcg Inhalation Daily Floydene Flock, MD   18 mcg at 01/02/23 1300     Review of Systems Full ROS  was asked and was negative except for the information on the HPI  Physical Exam Blood pressure 104/67, pulse 70, temperature 98.6 F (37 C), temperature source Oral, resp. rate 19, height 5\' 11"  (1.803 m), weight 124 kg, SpO2 93%. CONSTITUTIONAL: NAd BMI 39 . EYES: Pupils are equal, round,  Sclera are non-icteric. EARS, NOSE, MOUTH AND THROAT: The oropharynx is clear. The oral mucosa is pink and moist. Hearing is intact to voice. LYMPH NODES:  Lymph nodes in the neck are normal. RESPIRATORY:  Lungs are clear. There is normal respiratory effort, with equal breath sounds bilaterally, and without pathologic use of accessory muscles. CARDIOVASCULAR: Heart is regular without murmurs, gallops, or rubs. GI: The abdomen is  soft, mild diffuse tenderness w/o peritonitis. Large Panus There are no palpable masses. There is no hepatosplenomegaly. There are normal bowel sounds . GU: Rectal deferred.   MUSCULOSKELETAL: Normal muscle strength and tone. No cyanosis or edema.   SKIN: Turgor is good and there are no pathologic skin lesions or ulcers. NEUROLOGIC: Motor and sensation is grossly normal. Cranial nerves are grossly intact. PSYCH:  Oriented to person, place and time. Affect is normal.  Data Reviewed  I have personally reviewed the patient's imaging, laboratory findings and medical records.    Assessment/Plan 77 year old female with a BMI of 39 and moderate size paraesophageal hernia with [name] ulcers presents with upper GI bleed.  SHe is currently on Plavix.  Recommend endoscopy to assess paraesophageal hernia and ulcerations for both diagnostic and therapeutic purposes.  Have discussed this with Dr. Allegra Lai who plans to do endoscopy in the next 3 days or so to allow Plavix to wear off. no role for emergent surgical intervention.  She does have a high BMI which makes her not a candidate before paraesophageal hernia repair  unless there are more pressing issues such as volvulus or recalcitrant bleeding.  I have encourage her to optimize weight.  No need for surgical intervention at this time. I Will see her as an outpatient    Sterling Big, MD FACS General Surgeon 01/02/2023, 2:51 PM

## 2023-01-02 NOTE — Assessment & Plan Note (Signed)
Hematemesis x 3-4 over the past 2 days in the setting of Plavix use and baseline hiatal hernia on November 2022 EGD Hemoglobin fairly stable at 10.5. Case discussed with Dr. Allegra Lai who will evaluate the patient IV PPI Trend hemoglobin Transfuse for hemoglobin less than 7 Follow-up GI recommendations

## 2023-01-02 NOTE — Plan of Care (Signed)
  Problem: Education: Goal: Ability to describe self-care measures that may prevent or decrease complications (Diabetes Survival Skills Education) will improve Outcome: Progressing Goal: Individualized Educational Video(s) Outcome: Progressing   Problem: Coping: Goal: Ability to adjust to condition or change in health will improve Outcome: Progressing   Problem: Fluid Volume: Goal: Ability to maintain a balanced intake and output will improve Outcome: Progressing   Problem: Health Behavior/Discharge Planning: Goal: Ability to identify and utilize available resources and services will improve Outcome: Progressing Goal: Ability to manage health-related needs will improve Outcome: Progressing   Problem: Metabolic: Goal: Ability to maintain appropriate glucose levels will improve Outcome: Progressing   Problem: Nutritional: Goal: Maintenance of adequate nutrition will improve Outcome: Progressing Goal: Progress toward achieving an optimal weight will improve Outcome: Progressing   Problem: Tissue Perfusion: Goal: Adequacy of tissue perfusion will improve Outcome: Progressing   Problem: Skin Integrity: Goal: Risk for impaired skin integrity will decrease Outcome: Progressing   Problem: Education: Goal: Knowledge of General Education information will improve Description: Including pain rating scale, medication(s)/side effects and non-pharmacologic comfort measures Outcome: Progressing

## 2023-01-02 NOTE — ED Provider Notes (Signed)
[facility] Provider Note    Event Date/Time   First MD Initiated Contact with Patient 01/02/23 [phone]     (approximate)   History   No chief complaint on file.   HPI  Tina Hayes is a 77 y.o. female who presents to the ED for evaluation of No chief complaint on file.   I reviewed GI clinic visit from 11 months ago.  History of GERD, iron deficiency anemia, large hiatal hernia  Patient resents to the ED for evaluation of about 5 episodes of hematemesis in the past 24 hours as well as a couple episodes of melena.  She reports heartburn and epigastric discomfort without severe abdominal pain.  No fevers.  No syncope.   Physical Exam   Triage Vital Signs: ED Triage Vitals  Encounter Vitals Group     BP      Systolic BP Percentile      Diastolic BP Percentile      Pulse      Resp      Temp      Temp src      SpO2      Weight      Height      Head Circumference      Peak Flow      Pain Score      Pain Loc      Pain Education      Exclude from Growth Chart     Most recent vital signs: Vitals:   01/02/23 0600 01/02/23 0630  BP: (!) 176/158 (!) 170/91  Pulse: 70 69  Resp: 18 20  Temp:    SpO2: 97% 96%    General: Awake, no distress.  CV:  Good peripheral perfusion.  Resp:  Normal effort.  Abd:  No distention.  Minimal epigastric tenderness, otherwise benign MSK:  No deformity noted.  Neuro:  No focal deficits appreciated. Other:     ED Results / Procedures / Treatments   Labs (all labs ordered are listed, but only abnormal results are displayed) Labs Reviewed  COMPREHENSIVE METABOLIC PANEL - Abnormal; Notable for the following components:      Result Value   Glucose, Bld 207 (*)    Alkaline Phosphatase 142 (*)    All other components within normal limits  CBC WITH DIFFERENTIAL/PLATELET - Abnormal; Notable for the following components:   Hemoglobin 10.5 (*)    HCT 35.7 (*)    MCH 25.5 (*)    MCHC 29.4 (*)    All other  components within normal limits  LIPASE, BLOOD  MAGNESIUM  PROTIME-INR  APTT  TYPE AND SCREEN  TYPE AND SCREEN    EKG   RADIOLOGY   Official radiology report(s): No results found.  PROCEDURES and INTERVENTIONS:  Procedures  Medications  pantoprozole (PROTONIX) 80 mg /NS 100 mL infusion (8 mg/hr Intravenous New Bag/Given 01/02/23 0552)  pantoprazole (PROTONIX) injection 40 mg (has no administration in time range)  pantoprazole (PROTONIX) 80 mg /NS 100 mL IVPB (0 mg Intravenous Stopped 01/02/23 0611)  ondansetron (ZOFRAN) injection 4 mg (4 mg Intravenous Given 01/02/23 0551)     IMPRESSION / MDM / ASSESSMENT AND PLAN / ED COURSE  I reviewed the triage vital signs and the nursing notes.  Differential diagnosis includes, but is not limited to, GERD, gastritis, GI bleeding  {Patient presents with symptoms of an acute illness or injury that is potentially life-threatening.  Patient with large hiatal hernia and GERD presents with evidence of  an upper GI bleed requiring medical admission.  She is uncertain if she has been taking her PPI.  Small hemoglobin drop but she remained stable for indications for transfusions.  Normal lipase, electrolytes.  On Protonix bolus and drip.  Will consult medicine for admission.      FINAL CLINICAL IMPRESSION(S) / ED DIAGNOSES   Final diagnoses:  Upper GI bleed     Rx / DC Orders   ED Discharge Orders     None        Note:  This document was prepared using Dragon voice recognition software and may include unintentional dictation errors.   Delton Prairie, MD 01/02/23 [phone]

## 2023-01-02 NOTE — Assessment & Plan Note (Signed)
Stable at present  No active CP  Cont home regimen  Hold antiplatelet medications for now in setting of UGI bleeding

## 2023-01-03 DIAGNOSIS — K253 Acute gastric ulcer without hemorrhage or perforation: Secondary | ICD-10-CM

## 2023-01-03 DIAGNOSIS — D649 Anemia, unspecified: Secondary | ICD-10-CM | POA: Diagnosis not present

## 2023-01-03 LAB — CBC
HCT: 25.9 % — ABNORMAL LOW (ref 36.0–46.0)
Hemoglobin: 8 g/dL — ABNORMAL LOW (ref 12.0–15.0)
MCH: 25.9 pg — ABNORMAL LOW (ref 26.0–34.0)
MCHC: 30.9 g/dL (ref 30.0–36.0)
MCV: 83.8 fL (ref 80.0–100.0)
Platelets: 198 10*3/uL (ref 150–400)
RBC: 3.09 MIL/uL — ABNORMAL LOW (ref 3.87–5.11)
RDW: 15.3 % (ref 11.5–15.5)
WBC: 4.2 10*3/uL (ref 4.0–10.5)
nRBC: 0 % (ref 0.0–0.2)

## 2023-01-03 LAB — IRON AND TIBC
Iron: 54 ug/dL (ref 28–170)
Saturation Ratios: 18 % (ref 10.4–31.8)
TIBC: 308 ug/dL (ref 250–450)
UIBC: 254 ug/dL

## 2023-01-03 LAB — GLUCOSE, CAPILLARY
Glucose-Capillary: 108 mg/dL — ABNORMAL HIGH (ref 70–99)
Glucose-Capillary: 146 mg/dL — ABNORMAL HIGH (ref 70–99)
Glucose-Capillary: 306 mg/dL — ABNORMAL HIGH (ref 70–99)
Glucose-Capillary: 101 mg/dL — ABNORMAL HIGH (ref 70–99)
Glucose-Capillary: 63 mg/dL — ABNORMAL LOW (ref 70–99)

## 2023-01-03 LAB — COMPREHENSIVE METABOLIC PANEL
ALT: 12 U/L (ref 0–44)
AST: 13 U/L — ABNORMAL LOW (ref 15–41)
Albumin: 3 g/dL — ABNORMAL LOW (ref 3.5–5.0)
Alkaline Phosphatase: 101 U/L (ref 38–126)
Anion gap: 6 (ref 5–15)
BUN: 24 mg/dL — ABNORMAL HIGH (ref 8–23)
CO2: 28 mmol/L (ref 22–32)
Calcium: 8.4 mg/dL — ABNORMAL LOW (ref 8.9–10.3)
Chloride: 108 mmol/L (ref 98–111)
Creatinine, Ser: 0.75 mg/dL (ref 0.44–1.00)
GFR, Estimated: >60 mL/min (ref >60)
Glucose, Bld: 109 mg/dL — ABNORMAL HIGH (ref 70–99)
Potassium: 4.1 mmol/L (ref 3.5–5.1)
Sodium: 142 mmol/L (ref 135–145)
Total Bilirubin: 0.6 mg/dL (ref 0.3–1.2)
Total Protein: 6 g/dL — ABNORMAL LOW (ref 6.5–8.1)

## 2023-01-03 LAB — FERRITIN: Ferritin: 8 ng/mL — ABNORMAL LOW (ref 11–307)

## 2023-01-03 LAB — HEMOGLOBIN AND HEMATOCRIT, BLOOD
HCT: 28 % — ABNORMAL LOW (ref 36.0–46.0)
Hemoglobin: 8.5 g/dL — ABNORMAL LOW (ref 12.0–15.0)

## 2023-01-03 LAB — HEMOGLOBIN
Hemoglobin: 9.1 g/dL — ABNORMAL LOW (ref 12.0–15.0)
Hemoglobin: 8.5 g/dL — ABNORMAL LOW (ref 12.0–15.0)

## 2023-01-03 MED ORDER — POLYSACCHARIDE IRON COMPLEX 150 MG PO CAPS
150.0000 mg | ORAL_CAPSULE | Freq: Every day | ORAL | Status: DC
  Administered 2023-01-03 – 2023-01-05 (×3): 150 mg via ORAL
  Filled 2023-01-03 (×3): qty 1

## 2023-01-03 MED ORDER — ALUM & MAG HYDROXIDE-SIMETH 200-200-20 MG/5ML PO SUSP
30.0000 mL | Freq: Four times a day (QID) | ORAL | Status: DC | PRN
  Administered 2023-01-03: 30 mL via ORAL
  Filled 2023-01-03: qty 30

## 2023-01-03 MED ORDER — OXYCODONE-ACETAMINOPHEN 5-325 MG PO TABS
1.0000 | ORAL_TABLET | ORAL | Status: DC | PRN
  Administered 2023-01-03 – 2023-01-04 (×2): 1 via ORAL
  Filled 2023-01-03 (×2): qty 1

## 2023-01-03 NOTE — TOC CM/SW Note (Signed)
TOC consulted for medication assistance. Checked with MD, RN, and Pharmacy - no medication needs at this time. Patient is insured. Please re consult TOC if needs arise.  Alfonso Ramus, LCSW Transitions of Care Department [phone]

## 2023-01-03 NOTE — Hospital Course (Signed)
Tina Hayes is a 77 y.o. female with medical history significant of HFpEF, COPD, CAD, type 2 diabetes, GERD, hyperlipidemia, peripheral artery disease on Plavix, hiatal hernia with history of [name] lesions presenting with upper GI bleeding.  Limited history in the setting patient with overall poor historian.  Per report, patient states she has had 3-4 episodes of bloody vomiting.   Her hemoglobin dropped down to 8.8 from 10.5.  Patient was placed on IV Protonix, seen by general surgery and GI.  Planning to perform EGD on Monday if bleeding does not stop.

## 2023-01-03 NOTE — Progress Notes (Signed)
New IV obtained via ultrasound guided. Continuous IV Protonix initiated again.

## 2023-01-03 NOTE — Progress Notes (Signed)
Progress Note   Patient: Tina Hayes YNW:[id number] DOB: [birth year] DOA: 01/02/2023     1 DOS: the patient was seen and examined on 01/03/2023   Brief hospital course: IMAYA SAUNDERS is a 77 y.o. female with medical history significant of HFpEF, COPD, CAD, type 2 diabetes, GERD, hyperlipidemia, peripheral artery disease on Plavix, hiatal hernia with history of [name] lesions presenting with upper GI bleeding.  Limited history in the setting patient with overall poor historian.  Per report, patient states she has had 3-4 episodes of bloody vomiting.   Her hemoglobin dropped down to 8.8 from 10.5.  Patient was placed on IV Protonix, seen by general surgery and GI.  Planning to perform EGD on Monday if bleeding does not stop.   Principal Problem:   Upper GI bleed Active Problems:   Coronary artery disease   Cameron ulcer, acute   COPD (chronic obstructive pulmonary disease) (HCC)   Essential hypertension   Seizures (HCC)   Occlusion and stenosis of vertebral artery   Acute on chronic anemia   Paraesophageal hernia   Type 2 diabetes mellitus without complications (HCC)   (HFpEF) heart failure with preserved ejection fraction (HCC)   Assessment and Plan: * UGIB (upper gastrointestinal bleed) Cameron ulcer with hiatal hernia. Acute blood loss anemia. Hematemesis x 3-4 over the past 2 days in the setting of Plavix use and baseline hiatal hernia on November 2022 EGD Appreciate GI consult.  Continue PPI. Monitor hemoglobin, transfuse with hemoglobin less than 7. Check iron B12 level.     Coronary artery disease No active CP  Cont home regimen  Hold antiplatelet medications for now in setting of UGI bleeding      Chronic (HFpEF) heart failure with preserved ejection fraction [facility]) 2D echo April 2022 with EF of 55 to 60% grade 1 diastolic dysfunction No exacerbation.   Type 2 diabetes mellitus without complications (HCC) SSI    Occlusion and stenosis of vertebral artery Cont  statin  Hold plavix in setting of UGIB    Seizures (HCC) Stable Continue Keppra and Depakote   Essential hypertension BP stable   COPD (chronic obstructive pulmonary disease) (HCC) No exacerbation.        Subjective:  Patient feels well today, no nausea vomiting.  No bowel movement today.  Physical Exam: Vitals:   01/02/23 1720 01/02/23 1912 01/03/23 0540 01/03/23 0732  BP: (!) 92/54 126/75 129/70 (!) 151/66  Pulse: (!) 55 (!) 56 (!) 56 (!) 49  Resp: 17 18 20 16   Temp: 98.1 F (36.7 C) 98.2 F (36.8 C) 98.4 F (36.9 C) 97.6 F (36.4 C)  TempSrc: Oral Oral  Oral  SpO2: 100% 99% 93% 99%  Weight:      Height:       General exam: Appears calm and comfortable  Respiratory system: Clear to auscultation. Respiratory effort normal. Cardiovascular system: S1 & S2 heard, RRR. No JVD, murmurs, rubs, gallops or clicks. No pedal edema. Gastrointestinal system: Abdomen is nondistended, soft and nontender. No organomegaly or masses felt. Normal bowel sounds heard. Central nervous system: Alert and oriented. No focal neurological deficits. Extremities: Symmetric 5 x 5 power. Skin: No rashes, lesions or ulcers Psychiatry: Judgement and insight appear normal. Mood & affect appropriate.    Data Reviewed:  Lab results reviewed.  Family Communication: None  Disposition: Status is: Inpatient Remains inpatient appropriate because: Severity of disease, IV treatment.     Time spent: 35 minutes  Author: Marrion Coy, MD 01/03/2023 12:10 PM  For on call review [email or web address].

## 2023-01-03 NOTE — Plan of Care (Addendum)
Hemoglobin continues to be monitored. The patient has been getting up with one or two person assist to use the bedside commode. Continuous IV Protonix continues to be infused. Call bell and bed alarm are in place.  Problem: Education: Goal: Ability to describe self-care measures that may prevent or decrease complications (Diabetes Survival Skills Education) will improve Outcome: Progressing Goal: Individualized Educational Video(s) Outcome: Progressing   Problem: Coping: Goal: Ability to adjust to condition or change in health will improve Outcome: Progressing   Problem: Fluid Volume: Goal: Ability to maintain a balanced intake and output will improve Outcome: Progressing   Problem: Health Behavior/Discharge Planning: Goal: Ability to identify and utilize available resources and services will improve Outcome: Progressing Goal: Ability to manage health-related needs will improve Outcome: Progressing   Problem: Metabolic: Goal: Ability to maintain appropriate glucose levels will improve Outcome: Progressing   Problem: Nutritional: Goal: Maintenance of adequate nutrition will improve Outcome: Progressing Goal: Progress toward achieving an optimal weight will improve Outcome: Progressing   Problem: Skin Integrity: Goal: Risk for impaired skin integrity will decrease Outcome: Progressing   Problem: Tissue Perfusion: Goal: Adequacy of tissue perfusion will improve Outcome: Progressing

## 2023-01-04 ENCOUNTER — Inpatient Hospital Stay: Payer: Medicare PPO

## 2023-01-04 DIAGNOSIS — K259 Gastric ulcer, unspecified as acute or chronic, without hemorrhage or perforation: Secondary | ICD-10-CM | POA: Diagnosis not present

## 2023-01-04 DIAGNOSIS — T17308A Unspecified foreign body in larynx causing other injury, initial encounter: Secondary | ICD-10-CM | POA: Diagnosis not present

## 2023-01-04 DIAGNOSIS — R1319 Other dysphagia: Secondary | ICD-10-CM

## 2023-01-04 DIAGNOSIS — K449 Diaphragmatic hernia without obstruction or gangrene: Secondary | ICD-10-CM | POA: Diagnosis not present

## 2023-01-04 DIAGNOSIS — K253 Acute gastric ulcer without hemorrhage or perforation: Secondary | ICD-10-CM | POA: Diagnosis not present

## 2023-01-04 LAB — HEMOGLOBIN
Hemoglobin: 8.7 g/dL — ABNORMAL LOW (ref 12.0–15.0)
Hemoglobin: 9.5 g/dL — ABNORMAL LOW (ref 12.0–15.0)

## 2023-01-04 LAB — GLUCOSE, CAPILLARY
Glucose-Capillary: 141 mg/dL — ABNORMAL HIGH (ref 70–99)
Glucose-Capillary: 204 mg/dL — ABNORMAL HIGH (ref 70–99)
Glucose-Capillary: 207 mg/dL — ABNORMAL HIGH (ref 70–99)
Glucose-Capillary: 277 mg/dL — ABNORMAL HIGH (ref 70–99)

## 2023-01-04 LAB — VITAMIN B12: Vitamin B-12: 412 pg/mL (ref 180–914)

## 2023-01-04 MED ORDER — SUCRALFATE 1 GM/10ML PO SUSP
1.0000 g | Freq: Three times a day (TID) | ORAL | Status: DC
  Administered 2023-01-04 – 2023-01-05 (×3): 1 g via ORAL
  Filled 2023-01-04 (×3): qty 10

## 2023-01-04 MED ORDER — INSULIN ASPART 100 UNIT/ML IJ SOLN
0.0000 [IU] | Freq: Three times a day (TID) | INTRAMUSCULAR | Status: DC
  Administered 2023-01-04 (×2): 2 [IU] via SUBCUTANEOUS
  Administered 2023-01-05: 1 [IU] via SUBCUTANEOUS
  Administered 2023-01-05: 2 [IU] via SUBCUTANEOUS
  Filled 2023-01-04 (×4): qty 1

## 2023-01-04 NOTE — Progress Notes (Signed)
Pt blood glucose at 2121 was 63, 15 grams of carb was given per protocol. Rechecked pt CBG and it was 101. On call provider notified and aware.

## 2023-01-04 NOTE — Progress Notes (Signed)
Tina Repress, MD [address]  [address]  [place], [place] [address]  Main: [phone]  Fax: [phone] Pager: [phone]   Subjective: Patient tried to eat chicken this morning, felt like it was stuck in her throat.  She underwent CT chest without contrast which did not reveal any food impaction.  When I saw the patient, she reports she has difficulty swallowing, able to protect her airway, denies any pooling of saliva in her mouth or spitting up saliva.   Objective: Vital signs in last 24 hours: Vitals:   01/03/23 1539 01/03/23 1922 01/04/23 0253 01/04/23 0737  BP: (!) 140/51 (!) 155/94 (!) 157/83 129/69  Pulse: (!) 54 62 (!) 56 (!) 51  Resp: 18 18 18 18   Temp: 98.2 F (36.8 C) 99.3 F (37.4 C) 98.8 F (37.1 C) 98 F (36.7 C)  TempSrc: Oral Oral Oral Oral  SpO2: 99% 100% 100% 100%  Weight:      Height:       Weight change:   Intake/Output Summary (Last 24 hours) at 01/04/2023 1553 Last data filed at 01/04/2023 1100 Gross per 24 hour  Intake 600 ml  Output --  Net 600 ml    Exam: Heart: Bradycardia, regular rhythm, without murmur or extra heart sounds Lungs: clear to auscultation Abdomen: Mild epigastric tenderness   Lab Results:    Latest Ref Rng & Units 01/04/2023   12:27 PM 01/04/2023    5:45 AM 01/03/2023    7:41 PM  CBC  Hemoglobin 12.0 - 15.0 g/dL 9.5  8.7  8.5       Latest Ref Rng & Units 01/03/2023    4:18 AM 01/02/2023    5:32 AM 12/10/2022    9:37 PM  CMP  Glucose 70 - 99 mg/dL 347  425  956   BUN 8 - 23 mg/dL 24  22  14    Creatinine 0.44 - 1.00 mg/dL 3.87  5.64  3.32   Sodium 135 - 145 mmol/L 142  142  132   Potassium 3.5 - 5.1 mmol/L 4.1  3.8  3.9   Chloride 98 - 111 mmol/L 108  106  98   CO2 22 - 32 mmol/L 28  27  23    Calcium 8.9 - 10.3 mg/dL 8.4  9.2  9.3   Total Protein 6.5 - 8.1 g/dL 6.0  7.2  7.8   Total Bilirubin 0.3 - 1.2 mg/dL 0.6  0.3  0.4   Alkaline Phos 38 - 126 U/L 101  142  177   AST 15 - 41 U/L 13  22  22    ALT 0  - 44 U/L 12  17  22      Micro Results: No results found for this or any previous visit (from the past 240 hour(s)). Studies/Results: CT CHEST WO CONTRAST  Result Date: 01/04/2023 CLINICAL DATA:  77 year old female child on food. Query in esophagus versus right bronchus. EXAM: CT CHEST WITHOUT CONTRAST TECHNIQUE: Multidetector CT imaging of the chest was performed following the standard protocol without IV contrast. RADIATION DOSE REDUCTION: This exam was performed according to the departmental dose-optimization program which includes automated exposure control, adjustment of the mA and/or kV according to patient size and/or use of iterative reconstruction technique. COMPARISON:  Portable chest 12/10/2022.  Chest CT 05/29/2021. FINDINGS: Cardiovascular: Previous CABG. Calcified aortic atherosclerosis. Vascular patency is not evaluated in the absence of IV contrast. Stable mild cardiomegaly. No pericardial effusion. Mediastinum/Nodes: Calcified right subcarinal lymph node is post  granulomatous, benign and stable. No evidence of mediastinal lymphadenopathy on this noncontrast exam. The esophagus is decompressed at the thoracic inlet. There is mild retained gas in the upper thoracic esophagus which otherwise appears negative. Distal thoracic esophagus is nondilated, mild gas and contents there. Superimposed moderate size gastric hiatal hernia, not significantly changed compared to last year. Lungs/Pleura: Trace layering pleural fluid and superimposed mild dependent atelectasis in both costophrenic angles. The major airways are patent including the mainstem bronchi, segmental bronchi. There is subpleural upper lung predominant scarring. There is atelectasis in association with chronic hiatal hernia. No consolidation. No convincing acute pulmonary inflammation. Upper Abdomen: Large rim calcified gallstone in the gallbladder is nearly 3 cm. No pericholecystic inflammation is evident. Otherwise negative visible  noncontrast liver, spleen, pancreas, adrenal glands and kidneys. No free air free fluid in the visible upper abdomen. Musculoskeletal: Chronic sternotomy. 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 No acute osseous abnormality identified. IMPRESSION: 1. Major airways are clear and there is no convincing noncontrast CT evidence of an acute esophageal food impaction. There is a chronic moderate size gastric hiatal hernia which does not appear significantly changed from last year. 2. Pulmonary atelectasis and scarring, trace layering pleural effusions. No other acute or inflammatory process identified in the noncontrast chest.in both costophrenic angles. 3. Previous CABG. Aortic Atherosclerosis (ICD10-I70.0). Cholelithiasis. Electronically Signed   By: Odessa Fleming M.D.   On: 01/04/2023 10:37   Medications: I have reviewed the patient's current medications. Prior to Admission:  Medications Prior to Admission  Medication Sig Dispense Refill Last Dose   acetaminophen (TYLENOL) 500 MG tablet Take 500 mg by mouth every 6 (six) hours as needed for headache, moderate pain or fever.   prn at unknown   albuterol (PROVENTIL HFA;VENTOLIN HFA) 108 (90 Base) MCG/ACT inhaler Inhale 2 puffs into the lungs every 6 (six) hours as needed for wheezing or shortness of breath. 1 Inhaler 2 prn at unknown   albuterol (PROVENTIL) (2.5 MG/3ML) 0.083% nebulizer solution Take 2.5 mg every 4 (four) hours as needed by nebulization for wheezing or shortness of breath.   prn at unknown   amitriptyline (ELAVIL) 25 MG tablet Take 25 mg by mouth at bedtime.   prn at unknown   amLODipine (NORVASC) 5 MG tablet Take 1 tablet by mouth once daily 90 tablet 1 Past Week   atorvastatin (LIPITOR) 40 MG tablet Take 1 tablet (40 mg total) by mouth daily. 90 tablet 3 Past Week   carvedilol (COREG) 3.125 MG tablet Take 1 tablet (3.125 mg total) by mouth 2 (two) times daily with a meal. 90 tablet 1 Past Week   divalproex  (DEPAKOTE) 250 MG DR tablet Take 250 mg by mouth 2 (two) times daily.   Past Week   DUPIXENT 300 MG/2ML SOPN Inject into the skin.   12/21/2022   Ferrous Sulfate (IRON) 325 (65 Fe) MG TABS Take 1 tablet by mouth daily.   Past Week   HYDROcodone-acetaminophen (NORCO/VICODIN) 5-325 MG tablet Take 1 tablet by mouth every 6 (six) hours as needed.   Past Week   insulin aspart (NOVOLOG) 100 UNIT/ML FlexPen Inject 8 units subQ if blood sugar is consistently over 300 for more than 6 hours despite taking your other prescribed insulin. Repeat up to three times a day (every 8 hours) until blood sugars improve. 15 mL 0 Past Week   LANTUS SOLOSTAR 100 UNIT/ML Solostar Pen Inject 30-35 Units into the skin See admin instructions. Inject 30 units subcutaneously in  the morning at 35 units subcutaneously at bedtime.   Past Week   levETIRAcetam (KEPPRA) 750 MG tablet Take 1,500 mg by mouth 2 (two) times daily.   Past Week   losartan (COZAAR) 100 MG tablet Take 1 tablet by mouth once daily 90 tablet 3 Past Week   montelukast (SINGULAIR) 10 MG tablet Take 10 mg by mouth daily.   prn at unknown   nitroGLYCERIN (NITROSTAT) 0.4 MG SL tablet DISSOLVE ONE TABLET UNDER THE TONGUE EVERY 5 MINUTES AS NEEDED FOR CHEST PAIN.  DO NOT EXCEED A TOTAL OF 3 DOSES IN 15 MINUTES 25 tablet 0 prn at unknown   omeprazole (PRILOSEC) 40 MG capsule Take 1 capsule (40 mg total) by mouth 2 (two) times daily before a meal. 180 capsule 3 Past Week   sertraline (ZOLOFT) 100 MG tablet Take 150 mg by mouth daily.   Past Week   tiotropium (SPIRIVA) 18 MCG inhalation capsule Place 18 mcg into inhaler and inhale daily.   Past Week   traZODone (DESYREL) 50 MG tablet Take 50 mg by mouth at bedtime.   Past Week   B-D UF III MINI PEN NEEDLES 31G X 5 MM MISC USE WITH INSULIN PEN INJECTIONS TWICE DAILY      clopidogrel (PLAVIX) 75 MG tablet Take 1 tablet (75 mg total) by mouth daily. (Patient not taking: Reported on 01/02/2023) 90 tablet 3 Not Taking   iron  polysaccharides (NIFEREX) 150 MG capsule Take 1 capsule (150 mg total) by mouth daily. (Patient not taking: Reported on 01/02/2023) 30 capsule 0 Not Taking   isosorbide mononitrate (IMDUR) 30 MG 24 hr tablet Take 1 tablet (30 mg total) by mouth daily. (Patient not taking: Reported on 01/02/2023) 180 tablet 3 Not Taking   methocarbamol (ROBAXIN) 750 MG tablet TAKE 1 TABLET BY MOUTH EVERY 8 HOURS AS NEEDED FOR MUSCLE SPASM (Patient not taking: Reported on 01/02/2023) 90 tablet 0 Not Taking   potassium chloride SA (KLOR-CON) 20 MEQ tablet Take 2 tablets (40 mEq total) by mouth 2 (two) times daily. Take extra 2 tablets (40 meq) if you take metolazone (Patient not taking: Reported on 01/02/2023) 140 tablet 5 Not Taking   sucralfate (CARAFATE) 1 GM/10ML suspension Take 10 mLs (1 g total) by mouth 4 (four) times daily. (Patient not taking: Reported on 01/02/2023) 1200 mL 3 Not Taking   torsemide (DEMADEX) 20 MG tablet Take 1 tablet (20 mg) by mouth twice daily (Patient not taking: Reported on 01/02/2023)   Not Taking   Scheduled:  divalproex  250 mg Oral BID   insulin aspart  0-6 Units Subcutaneous TID WC   iron polysaccharides  150 mg Oral Daily   levETIRAcetam  1,500 mg Oral BID   [START ON 01/05/2023] pantoprazole  40 mg Intravenous Q12H   sucralfate  1 g Oral TID WC & HS   tiotropium  18 mcg Inhalation Daily   Continuous:  pantoprazole 8 mg/hr (01/04/23 0818)   URK:YHCW & mag hydroxide-simeth, oxyCODONE-acetaminophen Anti-infectives (From admission, onward)    None      Scheduled Meds:  divalproex  250 mg Oral BID   insulin aspart  0-6 Units Subcutaneous TID WC   iron polysaccharides  150 mg Oral Daily   levETIRAcetam  1,500 mg Oral BID   [START ON 01/05/2023] pantoprazole  40 mg Intravenous Q12H   tiotropium  18 mcg Inhalation Daily   Continuous Infusions:  pantoprazole 8 mg/hr (01/04/23 0818)   PRN Meds:.alum & mag hydroxide-simeth, oxyCODONE-acetaminophen  Assessment: Principal Problem:    Upper GI bleed Active Problems:   COPD (chronic obstructive pulmonary disease) (HCC)   Coronary artery disease   Essential hypertension   Seizures (HCC)   Occlusion and stenosis of vertebral artery   Acute on chronic anemia   Paraesophageal hernia   Cameron ulcer, acute   Type 2 diabetes mellitus without complications (HCC)   (HFpEF) heart failure with preserved ejection fraction (HCC)   Choking  Tina Hayes is a 77 y.o. female with history of coronary artery disease, peripheral artery disease on Plavix, large hiatal hernia with [name] ulcers, chronic iron deficiency anemia presented with 1 day history of hematemesis and melena with acute blood loss anemia   Plan: Acute blood loss anemia with hematemesis and melena: Currently resolved Hemoglobin is stable Patient has history of large hiatal hernia with [name] ulcers which is likely etiology Other differentials include erosive esophagitis or peptic ulcer disease Continue PPI drip Monitor CBC closely to maintain hemoglobin above 8 Patient is is on Plavix as outpatient, held during this admission.  Plavix needs to be held at least for 4 days before proceeding with upper endoscopy Resume diet Plan for upper endoscopy on Monday as patient complains of difficulty swallowing and had a choking spell this morning Continue long-term high-dose PPI 40 mg p.o. twice daily before meals Small meal at a time, strict antireflux measures Patient is also evaluated by general surgery for repair of hiatal hernia.  Given her BMI, patient is deemed not a candidate for repair of paraesophageal hernia unless she develops complicated hernia     Thank you for involving me in the care of this patient.  Dr. Tobi Bastos will cover from tomorrow   LOS: 2 days   Tina Hayes 01/04/2023, 3:53 PM

## 2023-01-04 NOTE — Progress Notes (Addendum)
Progress Note   Patient: Tina Hayes ZOX:[id number] DOB: 09-19-1945 DOA: 01/02/2023     2 DOS: the patient was seen and examined on 01/04/2023   Brief hospital course: Tina Hayes is a 77 y.o. female with medical history significant of HFpEF, COPD, CAD, type 2 diabetes, GERD, hyperlipidemia, peripheral artery disease on Plavix, hiatal hernia with history of Tina Hayes lesions presenting with upper GI bleeding.  Limited history in the setting patient with overall poor historian.  Per report, patient states she has had 3-4 episodes of bloody vomiting.   Her hemoglobin dropped down to 8.8 from 10.5.  Patient was placed on IV Protonix, seen by general surgery and GI.  Planning to perform EGD on Monday if bleeding does not stop.   Principal Problem:   Upper GI bleed Active Problems:   Coronary artery disease   Cameron ulcer, acute   COPD (chronic obstructive pulmonary disease) (HCC)   Essential hypertension   Seizures (HCC)   Occlusion and stenosis of vertebral artery   Acute on chronic anemia   Paraesophageal hernia   Type 2 diabetes mellitus without complications (HCC)   (HFpEF) heart failure with preserved ejection fraction (HCC)   Choking   Assessment and Plan: Choking. Patient choked while eating a piece of meat.  Afterwards, she is complaining of pain in the right sternal border area. I obtained a CT scan without contrast, did not not see any foreign material in the right bronchus or in esophagus. Dr. Allegra Lai has been notified, she will see the patient today.   UGIB (upper gastrointestinal bleed) Cameron ulcer with hiatal hernia. Acute blood loss anemia. Hematemesis x 3-4 over the past 2 days in the setting of Plavix use and baseline hiatal hernia on November 2022 EGD Appreciate GI consult.  Continue PPI. Monitor hemoglobin, transfuse with hemoglobin less than 7. Iron studies showed a normal iron with a lower ferritin, start iron supplement.  B12 level normal.     Coronary  artery disease No active CP  Cont home regimen  Hold antiplatelet medications for now in setting of UGI bleeding      Chronic (HFpEF) heart failure with preserved ejection fraction [facility]) 2D echo April 2022 with EF of 55 to 60% grade 1 diastolic dysfunction No exacerbation.   Type 2 diabetes mellitus without complications (HCC) SSI     Occlusion and stenosis of vertebral artery Cont statin  Hold plavix in setting of UGIB    Seizures (HCC) Stable Continue Keppra and Depakote   Essential hypertension BP stable   COPD (chronic obstructive pulmonary disease) (HCC) No exacerbation.     Subjective:  Patient choked while eating breakfast, subsequently felt pain in the right sternal border.  Vomited 1 time, with clear stomach contents.  Physical Exam: Vitals:   01/03/23 1539 01/03/23 1922 01/04/23 0253 01/04/23 0737  BP: (!) 140/51 (!) 155/94 (!) 157/83 129/69  Pulse: (!) 54 62 (!) 56 (!) 51  Resp: 18 18 18 18   Temp: 98.2 F (36.8 C) 99.3 F (37.4 C) 98.8 F (37.1 C) 98 F (36.7 C)  TempSrc: Oral Oral Oral Oral  SpO2: 99% 100% 100% 100%  Weight:      Height:       General exam: Appears calm and comfortable  Respiratory system: Clear to auscultation. Respiratory effort normal. Cardiovascular system: S1 & S2 heard, RRR. No JVD, murmurs, rubs, gallops or clicks. No pedal edema. Gastrointestinal system: Abdomen is nondistended, soft and nontender. No organomegaly or masses felt. Normal bowel  sounds heard. Central nervous system: Alert and oriented x3. No focal neurological deficits. Extremities: Symmetric 5 x 5 power. Skin: No rashes, lesions or ulcers Psychiatry: Mood & affect appropriate.    Data Reviewed:  Lab results reviewed.  Family Communication: son updated  Disposition: Status is: Inpatient Remains inpatient appropriate because: Severity of disease, IV treatment, inpatient procedure.     Time spent: 35 minutes  Author: Marrion Coy, MD 01/04/2023  12:19 PM  For on call review [email or web address].

## 2023-01-05 ENCOUNTER — Telehealth: Payer: Self-pay | Admitting: Anesthesiology

## 2023-01-05 ENCOUNTER — Telehealth: Payer: Self-pay | Admitting: Gastroenterology

## 2023-01-05 ENCOUNTER — Encounter
Admission: EM | Disposition: A | Discharge status: Home / Self Care | Payer: Self-pay | Source: Home / Self Care | Attending: Internal Medicine

## 2023-01-05 ENCOUNTER — Inpatient Hospital Stay: Payer: Medicare PPO | Admitting: Certified Registered"

## 2023-01-05 ENCOUNTER — Encounter: Payer: Self-pay | Admitting: Anesthesiology

## 2023-01-05 ENCOUNTER — Encounter: Payer: Self-pay | Admitting: Family Medicine

## 2023-01-05 DIAGNOSIS — Q399 Congenital malformation of esophagus, unspecified: Secondary | ICD-10-CM | POA: Diagnosis not present

## 2023-01-05 DIAGNOSIS — K224 Dyskinesia of esophagus: Secondary | ICD-10-CM

## 2023-01-05 DIAGNOSIS — K253 Acute gastric ulcer without hemorrhage or perforation: Secondary | ICD-10-CM | POA: Diagnosis not present

## 2023-01-05 DIAGNOSIS — D649 Anemia, unspecified: Secondary | ICD-10-CM | POA: Diagnosis not present

## 2023-01-05 DIAGNOSIS — K449 Diaphragmatic hernia without obstruction or gangrene: Secondary | ICD-10-CM | POA: Diagnosis not present

## 2023-01-05 HISTORY — PX: ESOPHAGOGASTRODUODENOSCOPY (EGD) WITH PROPOFOL: SHX5813

## 2023-01-05 LAB — GLUCOSE, CAPILLARY
Glucose-Capillary: 212 mg/dL — ABNORMAL HIGH (ref 70–99)
Glucose-Capillary: 258 mg/dL — ABNORMAL HIGH (ref 70–99)
Glucose-Capillary: 195 mg/dL — ABNORMAL HIGH (ref 70–99)

## 2023-01-05 SURGERY — ESOPHAGOGASTRODUODENOSCOPY (EGD) WITH PROPOFOL
Anesthesia: General

## 2023-01-05 MED ORDER — LANTUS SOLOSTAR 100 UNIT/ML ~~LOC~~ SOPN: 10.0000 [IU] | PEN_INJECTOR | SUBCUTANEOUS | 11 refills | Status: AC

## 2023-01-05 MED ORDER — PROPOFOL 10 MG/ML IV BOLUS
INTRAVENOUS | Status: DC | PRN
  Administered 2023-01-05: 20 mg via INTRAVENOUS
  Administered 2023-01-05: 100 mg via INTRAVENOUS

## 2023-01-05 MED ORDER — SODIUM CHLORIDE 0.9 % IV SOLN
INTRAVENOUS | Status: DC | PRN
Start: 2023-01-05 — End: 2023-01-05

## 2023-01-05 MED ORDER — SODIUM CHLORIDE 0.9 % IV SOLN: INTRAVENOUS | Status: DC

## 2023-01-05 MED ORDER — LIDOCAINE HCL (CARDIAC) PF 100 MG/5ML IV SOSY
PREFILLED_SYRINGE | INTRAVENOUS | Status: DC | PRN
  Administered 2023-01-05: 100 mg via INTRAVENOUS

## 2023-01-05 MED ORDER — SUCRALFATE 1 GM/10ML PO SUSP: 1.0000 g | Freq: Three times a day (TID) | ORAL | 0 refills | Status: DC

## 2023-01-05 NOTE — Op Note (Signed)
[facility] Patient Name: Tina Hayes Procedure Date: 01/05/2023 10:16 AM MRN: [id number] Account #: [id number] Date of Birth: 05-25-1945 Admit Type: Inpatient Age: 77 Room: [facility] ENDO ROOM 1 Gender: Female Note Status: Finalized Instrument Name: Patton Salles Endoscope [id number] Procedure:             Upper GI endoscopy Indications:           Coffee-ground emesis Providers:             Wyline Mood MD, MD Referring MD:          Gorden Harms PA Medicines:             Monitored Anesthesia Care Complications:         No immediate complications. Procedure:             Pre-Anesthesia Assessment:                        - Prior to the procedure, a History and Physical was                         performed, and patient medications, allergies and                         sensitivities were reviewed. The patient's tolerance                         of previous anesthesia was reviewed.                        - The risks and benefits of the procedure and the                         sedation options and risks were discussed with the                         patient. All questions were answered and informed                         consent was obtained.                        - ASA Grade Assessment: II - A patient with mild                         systemic disease.                        After obtaining informed consent, the endoscope was                         passed under direct vision. Throughout the procedure,                         the patient's blood pressure, pulse, and oxygen                         saturations were monitored continuously. The Endoscope                         was introduced through the  mouth, and advanced to the                         third part of duodenum. The upper GI endoscopy was                         accomplished with ease. The patient tolerated the                         procedure well. Findings:      The examined duodenum was  normal.      A large hiatal hernia was present.      Abnormal motility was noted in the esophagus. The cricopharyngeus was       abnormal. There is a decrease in motility of the esophageal body. The       distal esophagus/lower esophageal sphincter is spastic, but gives up       passage to the endoscope.      The examined esophagus was moderately tortuous. Impression:            - Normal examined duodenum.                        - Large hiatal hernia.                        - Abnormal esophageal motility.                        - Tortuous esophagus.                        - No specimens collected. Recommendation:        - Return patient to hospital ward for ongoing care.                        - Advance diet as tolerated.                        - Continue present medications.                        - Outpatient needs eval for achalasia                        Keep head end of the bed elevated at all times                        PPI                        Large hernia may require surgical eval as outpatient Procedure Code(s):     --- Professional ---                        [phone], Esophagogastroduodenoscopy, flexible,                         transoral; diagnostic, including collection of                         specimen(s) by brushing or washing, when performed                         (  separate procedure) Diagnosis Code(s):     --- Professional ---                        K44.9, Diaphragmatic hernia without obstruction or                         gangrene                        K22.4, Dyskinesia of esophagus                        Q39.9, Congenital malformation of esophagus,                         unspecified                        K92.0, Hematemesis CPT copyright 2022 American Medical Association. All rights reserved. The codes documented in this report are preliminary and upon coder review may  be revised to meet current compliance requirements. Wyline Mood, MD Wyline Mood MD,  MD 01/05/2023 10:46:04 AM This report has been signed electronically. Number of Addenda: 0 Note Initiated On: 01/05/2023 10:16 AM Estimated Blood Loss:  Estimated blood loss: none.      [facility]

## 2023-01-05 NOTE — Telephone Encounter (Signed)
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

## 2023-01-05 NOTE — Discharge Summary (Signed)
Physician Discharge Summary   Patient: Tina Hayes MRN: [id number] DOB: 1945-11-10  Admit date:     01/02/2023  Discharge date: 01/05/23  Discharge Physician: Marrion Coy   PCP: Gorden Harms, PA-C   Recommendations at discharge:   Follow-up with PCP in 1 week. Follow-up with GI in 2 weeks.  Discharge Diagnoses: Principal Problem:   Upper GI bleed Active Problems:   Coronary artery disease   Cameron ulcer, acute   COPD (chronic obstructive pulmonary disease) (HCC)   Essential hypertension   Seizures (HCC)   Occlusion and stenosis of vertebral artery   Acute on chronic anemia   Paraesophageal hernia   Type 2 diabetes mellitus without complications (HCC)   (HFpEF) heart failure with preserved ejection fraction (HCC)   Choking  Resolved Problems:   * No resolved hospital problems. *  Hospital Course: Tina Hayes is a 77 y.o. female with medical history significant of HFpEF, COPD, CAD, type 2 diabetes, GERD, hyperlipidemia, peripheral artery disease on Plavix, hiatal hernia with history of [name] lesions presenting with upper GI bleeding.  Limited history in the setting patient with overall poor historian.  Per report, patient states she has had 3-4 episodes of bloody vomiting.   Her hemoglobin dropped down to 8.8 from 10.5.  Patient was placed on IV Protonix, seen by general surgery and GI.   Patient had a EGD today, shows large hiatal hernia without active bleeding.  Hemoglobin has been stable, no additional black stool.  Patient is medically stable for discharge.   Assessment and Plan: Choking. Patient choked while eating a piece of meat 9/8.  Afterwards, she is complaining of pain in the right sternal border area. I obtained a CT scan without contrast, did not not see any foreign material in the right bronchus or in esophagus. Condition has resolved.    UGIB (upper gastrointestinal bleed) Cameron ulcer with hiatal hernia. Acute blood loss anemia. Hematemesis x  3-4 over the past 2 days in the setting of Plavix use and baseline hiatal hernia on November 2022 EGD Seen by GI, EGD showed large hiatal hernia.  No active bleeding.  Will continue iron orally, B12 level normal.     Coronary artery disease No active CP  Cont home meds     Chronic (HFpEF) heart failure with preserved ejection fraction [facility]) 2D echo April 2022 with EF of 55 to 60% grade 1 diastolic dysfunction No exacerbation.   Type 2 diabetes mellitus with hyperglycemia and hypoglycemia. Insulin treatment, with reduced dose of insulin glargine.  Follow-up with PCP as outpatient.     Occlusion and stenosis of vertebral artery Resume home treatment.   Seizures (HCC) Resume home treatments   Essential hypertension Blood pressure running high, resume home dose blood pressure medicines.   COPD (chronic obstructive pulmonary disease) (HCC) No exacerbation.         Consultants: GI Procedures performed: EGD  Disposition: Home Diet recommendation:  Discharge Diet Orders (From admission, onward)     Start     Ordered   01/05/23 0000  Diet Carb Modified        01/05/23 1215           Carb modified diet DISCHARGE MEDICATION: Allergies as of 01/05/2023       Reactions   Aspirin Anaphylaxis, Shortness Of Breath, Swelling   LIPS AND THROAT SWELL, DIFFICULT TO BREATH        Medication List     TAKE these medications    acetaminophen 500  MG tablet Commonly known as: TYLENOL Take 500 mg by mouth every 6 (six) hours as needed for headache, moderate pain or fever.   albuterol (2.5 MG/3ML) 0.083% nebulizer solution Commonly known as: PROVENTIL Take 2.5 mg every 4 (four) hours as needed by nebulization for wheezing or shortness of breath.   albuterol 108 (90 Base) MCG/ACT inhaler Commonly known as: VENTOLIN HFA Inhale 2 puffs into the lungs every 6 (six) hours as needed for wheezing or shortness of breath.   amitriptyline 25 MG tablet Commonly known as:  ELAVIL Take 25 mg by mouth at bedtime.   amLODipine 5 MG tablet Commonly known as: NORVASC Take 1 tablet by mouth once daily   atorvastatin 40 MG tablet Commonly known as: LIPITOR Take 1 tablet (40 mg total) by mouth daily.   B-D UF III MINI PEN NEEDLES 31G X 5 MM Misc Generic drug: Insulin Pen Needle USE WITH INSULIN PEN INJECTIONS TWICE DAILY   carvedilol 3.125 MG tablet Commonly known as: COREG Take 1 tablet (3.125 mg total) by mouth 2 (two) times daily with a meal.   divalproex 250 MG DR tablet Commonly known as: DEPAKOTE Take 250 mg by mouth 2 (two) times daily.   Dupixent 300 MG/2ML Sopn Generic drug: Dupilumab Inject into the skin.   HYDROcodone-acetaminophen 5-325 MG tablet Commonly known as: NORCO/VICODIN Take 1 tablet by mouth every 6 (six) hours as needed.   insulin aspart 100 UNIT/ML FlexPen Commonly known as: NOVOLOG Inject 8 units subQ if blood sugar is consistently over 300 for more than 6 hours despite taking your other prescribed insulin. Repeat up to three times a day (every 8 hours) until blood sugars improve.   Iron 325 (65 Fe) MG Tabs Take 1 tablet by mouth daily.   Lantus SoloStar 100 UNIT/ML Solostar Pen Generic drug: insulin glargine Inject 10 Units into the skin See admin instructions. Inject 30 units subcutaneously in the morning at 35 units subcutaneously at bedtime. What changed: how much to take   levETIRAcetam 750 MG tablet Commonly known as: KEPPRA Take 1,500 mg by mouth 2 (two) times daily.   losartan 100 MG tablet Commonly known as: COZAAR Take 1 tablet by mouth once daily   montelukast 10 MG tablet Commonly known as: SINGULAIR Take 10 mg by mouth daily.   nitroGLYCERIN 0.4 MG SL tablet Commonly known as: NITROSTAT DISSOLVE ONE TABLET UNDER THE TONGUE EVERY 5 MINUTES AS NEEDED FOR CHEST PAIN.  DO NOT EXCEED A TOTAL OF 3 DOSES IN 15 MINUTES   omeprazole 40 MG capsule Commonly known as: PRILOSEC Take 1 capsule (40 mg total)  by mouth 2 (two) times daily before a meal.   sertraline 100 MG tablet Commonly known as: ZOLOFT Take 150 mg by mouth daily.   sucralfate 1 GM/10ML suspension Commonly known as: CARAFATE Take 10 mLs (1 g total) by mouth 4 (four) times daily -  with meals and at bedtime.   tiotropium 18 MCG inhalation capsule Commonly known as: SPIRIVA Place 18 mcg into inhaler and inhale daily.   traZODone 50 MG tablet Commonly known as: DESYREL Take 50 mg by mouth at bedtime.               Durable Medical Equipment  (From admission, onward)           Start     Ordered   01/05/23 1212  For home use only DME Bedside commode  Once       Question:  Patient needs a  bedside commode to treat with the following condition  Answer:  Acute blood loss anemia   01/05/23 1211            Follow-up Information     Pabon, [place] F, MD Follow up in 3 week(s).   Specialty: General Surgery Why: hiatal hernia Contact information: [address] [place] [address] [phone]         Gorden Harms, PA-C Follow up in 1 week(s).   Specialty: Physician Assistant Contact information: [address] [place] [address] [phone]         Wyline Mood, MD Follow up in 2 week(s).   Specialty: Gastroenterology Contact information: [address] [place] [address] [phone]                Discharge Exam: Ceasar Mons Weights   01/02/23 0539  Weight: 124 kg   General exam: Appears calm and comfortable  Respiratory system: Clear to auscultation. Respiratory effort normal. Cardiovascular system: S1 & S2 heard, RRR. No JVD, murmurs, rubs, gallops or clicks. No pedal edema. Gastrointestinal system: Abdomen is nondistended, soft and nontender. No organomegaly or masses felt. Normal bowel sounds heard. Central nervous system: Alert and oriented. No focal neurological deficits. Extremities: Symmetric 5 x 5 power. Skin: No rashes, lesions or  ulcers Psychiatry: Judgement and insight appear normal. Mood & affect appropriate.    Condition at discharge: good  The results of significant diagnostics from this hospitalization (including imaging, microbiology, ancillary and laboratory) are listed below for reference.   Imaging Studies: CT CHEST WO CONTRAST  Result Date: 01/04/2023 CLINICAL DATA:  77 year old female child on food. Query in esophagus versus right bronchus. EXAM: CT CHEST WITHOUT CONTRAST TECHNIQUE: Multidetector CT imaging of the chest was performed following the standard protocol without IV contrast. RADIATION DOSE REDUCTION: This exam was performed according to the departmental dose-optimization program which includes automated exposure control, adjustment of the mA and/or kV according to patient size and/or use of iterative reconstruction technique. COMPARISON:  Portable chest 12/10/2022.  Chest CT 05/29/2021. FINDINGS: Cardiovascular: Previous CABG. Calcified aortic atherosclerosis. Vascular patency is not evaluated in the absence of IV contrast. Stable mild cardiomegaly. No pericardial effusion. Mediastinum/Nodes: Calcified right subcarinal lymph node is post granulomatous, benign and stable. No evidence of mediastinal lymphadenopathy on this noncontrast exam. The esophagus is decompressed at the thoracic inlet. There is mild retained gas in the upper thoracic esophagus which otherwise appears negative. Distal thoracic esophagus is nondilated, mild gas and contents there. Superimposed moderate size gastric hiatal hernia, not significantly changed compared to last year. Lungs/Pleura: Trace layering pleural fluid and superimposed mild dependent atelectasis in both costophrenic angles. The major airways are patent including the mainstem bronchi, segmental bronchi. There is subpleural upper lung predominant scarring. There is atelectasis in association with chronic hiatal hernia. No consolidation. No convincing acute pulmonary  inflammation. Upper Abdomen: Large rim calcified gallstone in the gallbladder is nearly 3 cm. No pericholecystic inflammation is evident. Otherwise negative visible noncontrast liver, spleen, pancreas, adrenal glands and kidneys. No free air free fluid in the visible upper abdomen. Musculoskeletal: Chronic sternotomy. 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 No acute osseous abnormality identified. IMPRESSION: 1. Major airways are clear and there is no convincing noncontrast CT evidence of an acute esophageal food impaction. There is a chronic moderate size gastric hiatal hernia which does not appear significantly changed from last year. 2. Pulmonary atelectasis and scarring, trace layering pleural effusions. No other acute  or inflammatory process identified in the noncontrast chest.in both costophrenic angles. 3. Previous CABG. Aortic Atherosclerosis (ICD10-I70.0). Cholelithiasis. Electronically Signed   By: Odessa Fleming M.D.   On: 01/04/2023 10:37   DG Chest Portable 1 View  Result Date: 12/10/2022 CLINICAL DATA:  Weakness. EXAM: PORTABLE CHEST 1 VIEW COMPARISON:  Chest radiograph dated 04/10/2022. FINDINGS: Diffuse chronic interstitial coarsening and bronchitic changes. No focal consolidation, pleural effusion, or pneumothorax. Mild cardiomegaly. Median sternotomy wires. Small hiatal hernia. No acute osseous pathology. IMPRESSION: 1. No acute cardiopulmonary process. 2. Mild cardiomegaly. Electronically Signed   By: Elgie Collard M.D.   On: 12/10/2022 21:28    Microbiology: Results for orders placed or performed during the hospital encounter of 12/10/22  SARS Coronavirus 2 by RT PCR (hospital order, performed in [facility] hospital lab) *cepheid single result test* Anterior Nasal Swab     Status: None   Collection Time: 12/10/22  9:24 PM   Specimen: Anterior Nasal Swab  Result Value Ref Range Status   SARS Coronavirus 2 by RT PCR NEGATIVE NEGATIVE Final     Comment: (NOTE) SARS-CoV-2 target nucleic acids are NOT DETECTED.  The SARS-CoV-2 RNA is generally detectable in upper and lower respiratory specimens during the acute phase of infection. The lowest concentration of SARS-CoV-2 viral copies this assay can detect is 250 copies / mL. A negative result does not preclude SARS-CoV-2 infection and should not be used as the sole basis for treatment or other patient management decisions.  A negative result may occur with improper specimen collection / handling, submission of specimen other than nasopharyngeal swab, presence of viral mutation(s) within the areas targeted by this assay, and inadequate number of viral copies (<250 copies / mL). A negative result must be combined with clinical observations, patient history, and epidemiological information.  Fact Sheet for Patients:   RoadLapTop.co.za  Fact Sheet for Healthcare Providers: http://kim-miller.com/  This test is not yet approved or  cleared by the [place] FDA and has been authorized for detection and/or diagnosis of SARS-CoV-2 by FDA under an Emergency Use Authorization (EUA).  This EUA will remain in effect (meaning this test can be used) for the duration of the COVID-19 declaration under Section 564(b)(1) of the Act, 21 U.S.C. section 360bbb-3(b)(1), unless the authorization is terminated or revoked sooner.  Performed at [facility], [address] [place], [place] [address]     Labs: CBC: Recent Labs  Lab 01/02/23 0532 01/02/23 6045 01/02/23 1413 01/02/23 2057 01/03/23 0224 01/03/23 0418 01/03/23 1250 01/03/23 1941 01/04/23 0545 01/04/23 1227  WBC 6.0  --   --   --   --  4.2  --   --   --   --   NEUTROABS 3.9  --   --   --   --   --   --   --   --   --   HGB 10.5* 8.8* 8.8* 8.4* 8.5* 8.0* 9.1* 8.5* 8.7* 9.5*  HCT 35.7* 28.7* 29.7* 28.0* 28.0* 25.9*  --   --   --   --   MCV 86.7  --   --   --   --  83.8   --   --   --   --   PLT 250  --   --   --   --  198  --   --   --   --    Basic Metabolic Panel: Recent Labs  Lab 01/02/23 0532 01/03/23 0418  NA  142 142  K 3.8 4.1  CL 106 108  CO2 27 28  GLUCOSE 207* 109*  BUN 22 24*  CREATININE 0.73 0.75  CALCIUM 9.2 8.4*  MG 2.1  --    Liver Function Tests: Recent Labs  Lab 01/02/23 0532 01/03/23 0418  AST 22 13*  ALT 17 12  ALKPHOS 142* 101  BILITOT 0.3 0.6  PROT 7.2 6.0*  ALBUMIN 3.8 3.0*   CBG: Recent Labs  Lab 01/04/23 1644 01/04/23 2057 01/05/23 0734 01/05/23 1025 01/05/23 1132  GLUCAP 207* 277* 212* 258* 195*    Discharge time spent: greater than 30 minutes.  Signed: Marrion Coy, MD [facility] 01/05/2023

## 2023-01-05 NOTE — Progress Notes (Signed)
Patient is not able to walk the distance required to go the bathroom, or he/she is unable to safely negotiate stairs required to access the bathroom.  A 3in1 BSC will alleviate this problem  

## 2023-01-05 NOTE — Anesthesia Preprocedure Evaluation (Signed)
Anesthesia Evaluation  Patient identified by MRN, date of birth, ID band Patient awake    Reviewed: Allergy & Precautions, H&P , NPO status , Patient's Chart, lab work & pertinent test results, reviewed documented beta blocker date and time   Airway Mallampati: II   Neck ROM: full    Dental  (+) Edentulous Upper, Edentulous Lower   Pulmonary COPD,  COPD inhaler, former smoker   Pulmonary exam normal        Cardiovascular Exercise Tolerance: Poor hypertension, On Medications + angina with exertion + CAD, + Past MI, + Peripheral Vascular Disease, +CHF and + DOE  (-) Orthopnea Normal cardiovascular exam Rhythm:regular Rate:Normal     Neuro/Psych  Headaches, Seizures -,  PSYCHIATRIC DISORDERS Anxiety Depression     Neuromuscular disease    GI/Hepatic Neg liver ROS, hiatal hernia, PUD,GERD  Medicated,,  Endo/Other  negative endocrine ROSdiabetes    Renal/GU Renal disease  negative genitourinary   Musculoskeletal   Abdominal   Peds  Hematology  (+) Blood dyscrasia, anemia   Anesthesia Other Findings Past Medical History: No date: (HFpEF) heart failure with preserved ejection fraction (HCC)     Comment:  a. 05/2016 Echo: EF 60-65%, mild to mod LVH, Gr1 DD, mild              MR, mildly dil LA, mod TR, mildly to mod increased PASP;               b. 07/2020 Echo: EF 55-60%, no rwma, mild LVH, GrIDD,               mildly dil LA, mild MR, mild-mod TR, mild-mod AoV               sclerosis w/o stenosis. No date: Anxiety No date: Arthritis No date: Chronic back pain No date: COPD (chronic obstructive pulmonary disease) (HCC) No date: Coronary artery disease     Comment:  a. s/p remote PCI x 5;  b. 2006 s/p CABG x 3               ([place], [place] - [facility]); b. 05/2016               MV: attenuation corrected images w/o ischemia or               wma-->Med rx; c. 06/2020 MV: EF 55-65%, no                isch/infart-->low risk. No date: Depression No date: Diabetes mellitus without complication (HCC) 06/30/2016: Essential hypertension No date: GERD (gastroesophageal reflux disease) No date: Heart attack (HCC)     Comment:  Total of 3 per pt. No date: Hyperlipidemia LDL goal <55 06/03/2015: Hypertensive urgency No date: PAD (peripheral artery disease) (HCC)     Comment:  a. 06/2019 Angio: R AT (PTA), R POP (PTA & Viabahn               stenting); b. 09/2020 ABIs: R 1.24, L 1.35. No date: Palpitations     Comment:  a. 06/2020 Zio: Sinus brady, 54 (40-143), 4 beats NSVT, 3              SVT runs (max 143 bpm x 4 beats). No date: Seizure [facility]) Past Surgical History: No date: ABDOMINAL HYSTERECTOMY No date: ANKLE SURGERY; Right No date: CATARACT EXTRACTION W/ INTRAOCULAR LENS  IMPLANT, BILATERAL 05/04/2017: COLONOSCOPY WITH PROPOFOL; N/A     Comment:  Procedure: COLONOSCOPY WITH PROPOFOL;  Surgeon:  Scot Jun, MD;  Location: [facility] ENDOSCOPY;  Service:               Endoscopy;  Laterality: N/A; No date: CORONARY ANGIOPLASTY No date: CORONARY ARTERY BYPASS GRAFT     Comment:  TRIPLE BYPASS 02/25/2021: ESOPHAGOGASTRODUODENOSCOPY; N/A     Comment:  Procedure: ESOPHAGOGASTRODUODENOSCOPY (EGD);  Surgeon:               Toney Reil, MD;  Location: [facility] ENDOSCOPY;                Service: Gastroenterology;  Laterality: N/A; 05/04/2017: ESOPHAGOGASTRODUODENOSCOPY (EGD) WITH PROPOFOL; N/A     Comment:  Procedure: ESOPHAGOGASTRODUODENOSCOPY (EGD) WITH               PROPOFOL;  Surgeon: Scot Jun, MD;  Location:               [facility] ENDOSCOPY;  Service: Endoscopy;  Laterality: N/A; 03/02/2018: EXCISION MASS LOWER EXTREMETIES; Right     Comment:  Procedure: EXCISION SOFT TISSUE MASS FROM MEDIAL ASPECT               OF RIGHT ANKLE;  Surgeon: Christena Flake, MD;  Location:               [facility] ORS;  Service: Orthopedics;  Laterality: Right; No date: FRACTURE SURGERY No date:  JOINT REPLACEMENT     Comment:  right No date: KNEE SURGERY; Right 07/14/2019: LOWER EXTREMITY ANGIOGRAPHY; Right     Comment:  Procedure: LOWER EXTREMITY ANGIOGRAPHY;  Surgeon: Annice Needy, MD;  Location: [facility] INVASIVE CV LAB;  Service:               Cardiovascular;  Laterality: Right; 07/14/2017: MOUTH SURGERY; Left 09/27/2019: TOTAL KNEE ARTHROPLASTY; Left     Comment:  Procedure: TOTAL KNEE ARTHROPLASTY;  Surgeon: Christena Flake, MD;  Location: [facility] ORS;  Service: Orthopedics;                Laterality: Left; BMI    Body Mass Index: 38.13 kg/m     Reproductive/Obstetrics negative OB ROS                             Anesthesia Physical Anesthesia Plan  ASA: 4  Anesthesia Plan: General   Post-op Pain Management:    Induction:   PONV Risk Score and Plan:   Airway Management Planned:   Additional Equipment:   Intra-op Plan:   Post-operative Plan:   Informed Consent: I have reviewed the patients History and Physical, chart, labs and discussed the procedure including the risks, benefits and alternatives for the proposed anesthesia with the patient or authorized representative who has indicated his/her understanding and acceptance.     Dental Advisory Given  Plan Discussed with: CRNA  Anesthesia Plan Comments:        Anesthesia Quick Evaluation

## 2023-01-05 NOTE — Care Management Important Message (Signed)
Important Message  Patient Details  Name: Tina Hayes MRN: [id number] Date of Birth: 05/04/1945   Medicare Important Message Given:  Yes  Reviewed Medicare IM with patient, obtained initial consent.  Copy of Medicare IM left in room for reference, original to be scanned into the chart.      [name] 01/05/2023, 2:02 PM

## 2023-01-05 NOTE — Telephone Encounter (Signed)
PT called states that she is still in the hospital, isn't doing well. PT states that she needed her medications, patient missed vv appt on last Friday. PT states that she wanted to see if Pernell Dupre will give her a call or come to see her.

## 2023-01-05 NOTE — Transfer of Care (Signed)
Immediate Anesthesia Transfer of Care Note  Patient: Tina Hayes  Procedure(s) Performed: ESOPHAGOGASTRODUODENOSCOPY (EGD) WITH PROPOFOL  Patient Location: PACU and Endoscopy Unit  Anesthesia Type:General  Level of Consciousness: sedated  Airway & Oxygen Therapy: Patient Spontanous Breathing  Post-op Assessment: Report given to RN and Post -op Vital signs reviewed and stable  Post vital signs: Reviewed and stable  Last Vitals:  Vitals Value Taken Time  BP    Temp    Pulse    Resp    SpO2      Last Pain:  Vitals:   01/05/23 1000  TempSrc:   PainSc: 0-No pain      Patients Stated Pain Goal: 0 (01/05/23 1000)  Complications: No notable events documented.

## 2023-01-05 NOTE — Anesthesia Procedure Notes (Signed)
Procedure Name: MAC Date/Time: 01/05/2023 10:37 AM  Performed by: Cheral Bay, CRNAPre-anesthesia Checklist: Patient identified, Emergency Drugs available, Suction available, Patient being monitored and Timeout performed Patient Re-evaluated:Patient Re-evaluated prior to induction Oxygen Delivery Method: Simple face mask Induction Type: IV induction Placement Confirmation: positive ETCO2 and CO2 detector

## 2023-01-05 NOTE — H&P (Signed)
Wyline Mood, MD [address], [place], [place], [address] [address], [address], [place], [place], [address] Phone: [phone]  Fax: [phone]  Primary Care Physician:  Gorden Harms, PA-C   Pre-Procedure History & Physical: HPI:  Tina Hayes is a 77 y.o. female is here for an endoscopy    Past Medical History:  Diagnosis Date   (HFpEF) heart failure with preserved ejection fraction (HCC)    a. 05/2016 Echo: EF 60-65%, mild to mod LVH, Gr1 DD, mild MR, mildly dil LA, mod TR, mildly to mod increased PASP; b. 07/2020 Echo: EF 55-60%, no rwma, mild LVH, GrIDD, mildly dil LA, mild MR, mild-mod TR, mild-mod AoV sclerosis w/o stenosis.   Anxiety    Arthritis    Chronic back pain    COPD (chronic obstructive pulmonary disease) (HCC)    Coronary artery disease    a. s/p remote PCI x 5;  b. 2006 s/p CABG x 3 ([place], [place] - [facility]); b. 05/2016 MV: attenuation corrected images w/o ischemia or wma-->Med rx; c. 06/2020 MV: EF 55-65%, no isch/infart-->low risk.   Depression    Diabetes mellitus without complication (HCC)    Essential hypertension 06/30/2016   GERD (gastroesophageal reflux disease)    Heart attack (HCC)    Total of 3 per pt.   Hyperlipidemia LDL goal <55    Hypertensive urgency 06/03/2015   PAD (peripheral artery disease) (HCC)    a. 06/2019 Angio: R AT (PTA), R POP (PTA & Viabahn stenting); b. 09/2020 ABIs: R 1.24, L 1.35.   Palpitations    a. 06/2020 Zio: Sinus brady, 54 (40-143), 4 beats NSVT, 3 SVT runs (max 143 bpm x 4 beats).   Seizure [facility])     Past Surgical History:  Procedure Laterality Date   ABDOMINAL HYSTERECTOMY     ANKLE SURGERY Right    CATARACT EXTRACTION W/ INTRAOCULAR LENS  IMPLANT, BILATERAL     COLONOSCOPY WITH PROPOFOL N/A 05/04/2017   Procedure: COLONOSCOPY WITH PROPOFOL;  Surgeon: Scot Jun, MD;  Location: [facility] ENDOSCOPY;  Service: Endoscopy;  Laterality: N/A;   CORONARY ANGIOPLASTY     CORONARY  ARTERY BYPASS GRAFT     TRIPLE BYPASS   ESOPHAGOGASTRODUODENOSCOPY N/A 02/25/2021   Procedure: ESOPHAGOGASTRODUODENOSCOPY (EGD);  Surgeon: Toney Reil, MD;  Location: [facility] ENDOSCOPY;  Service: Gastroenterology;  Laterality: N/A;   ESOPHAGOGASTRODUODENOSCOPY (EGD) WITH PROPOFOL N/A 05/04/2017   Procedure: ESOPHAGOGASTRODUODENOSCOPY (EGD) WITH PROPOFOL;  Surgeon: Scot Jun, MD;  Location: [facility] ENDOSCOPY;  Service: Endoscopy;  Laterality: N/A;   EXCISION MASS LOWER EXTREMETIES Right 03/02/2018   Procedure: EXCISION SOFT TISSUE MASS FROM MEDIAL ASPECT OF RIGHT ANKLE;  Surgeon: Christena Flake, MD;  Location: [facility] ORS;  Service: Orthopedics;  Laterality: Right;   FRACTURE SURGERY     JOINT REPLACEMENT     right   KNEE SURGERY Right    LOWER EXTREMITY ANGIOGRAPHY Right 07/14/2019   Procedure: LOWER EXTREMITY ANGIOGRAPHY;  Surgeon: Annice Needy, MD;  Location: [facility] INVASIVE CV LAB;  Service: Cardiovascular;  Laterality: Right;   MOUTH SURGERY Left 07/14/2017   TOTAL KNEE ARTHROPLASTY Left 09/27/2019   Procedure: TOTAL KNEE ARTHROPLASTY;  Surgeon: Christena Flake, MD;  Location: [facility] ORS;  Service: Orthopedics;  Laterality: Left;    Prior to Admission medications   Medication Sig Start Date End Date Taking? Authorizing Provider  acetaminophen (TYLENOL) 500 MG tablet Take 500 mg by mouth every 6 (six) hours as needed for  headache, moderate pain or fever.   Yes [provider]  albuterol (PROVENTIL HFA;VENTOLIN HFA) 108 (90 Base) MCG/ACT inhaler Inhale 2 puffs into the lungs every 6 (six) hours as needed for wheezing or shortness of breath. 08/30/15  Yes Katha Hamming, MD  albuterol (PROVENTIL) (2.5 MG/3ML) 0.083% nebulizer solution Take 2.5 mg every 4 (four) hours as needed by nebulization for wheezing or shortness of breath.   Yes [provider]  amitriptyline (ELAVIL) 25 MG tablet Take 25 mg by mouth at bedtime. 01/20/22  Yes [provider]  amLODipine  (NORVASC) 5 MG tablet Take 1 tablet by mouth once daily 09/23/22  Yes Gollan, Tollie Pizza, MD  atorvastatin (LIPITOR) 40 MG tablet Take 1 tablet (40 mg total) by mouth daily. 02/04/22  Yes Antonieta Iba, MD  carvedilol (COREG) 3.125 MG tablet Take 1 tablet (3.125 mg total) by mouth 2 (two) times daily with a meal. 07/21/22  Yes Gollan, Tollie Pizza, MD  divalproex (DEPAKOTE) 250 MG DR tablet Take 250 mg by mouth 2 (two) times daily. 07/28/22  Yes [provider]  DUPIXENT 300 MG/2ML SOPN Inject into the skin. 12/30/21  Yes [provider]  Ferrous Sulfate (IRON) 325 (65 Fe) MG TABS Take 1 tablet by mouth daily. 09/01/22  Yes [provider]  HYDROcodone-acetaminophen (NORCO/VICODIN) 5-325 MG tablet Take 1 tablet by mouth every 6 (six) hours as needed. 12/02/22  Yes [provider]  insulin aspart (NOVOLOG) 100 UNIT/ML FlexPen Inject 8 units subQ if blood sugar is consistently over 300 for more than 6 hours despite taking your other prescribed insulin. Repeat up to three times a day (every 8 hours) until blood sugars improve. 12/10/22  Yes Shaune Pollack, MD  LANTUS SOLOSTAR 100 UNIT/ML Solostar Pen Inject 30-35 Units into the skin See admin instructions. Inject 30 units subcutaneously in the morning at 35 units subcutaneously at bedtime. 05/26/19  Yes [provider]  levETIRAcetam (KEPPRA) 750 MG tablet Take 1,500 mg by mouth 2 (two) times daily.   Yes [provider]  losartan (COZAAR) 100 MG tablet Take 1 tablet by mouth once daily 02/10/22  Yes Gollan, Tollie Pizza, MD  montelukast (SINGULAIR) 10 MG tablet Take 10 mg by mouth daily. 02/02/22  Yes [provider]  nitroGLYCERIN (NITROSTAT) 0.4 MG SL tablet DISSOLVE ONE TABLET UNDER THE TONGUE EVERY 5 MINUTES AS NEEDED FOR CHEST PAIN.  DO NOT EXCEED A TOTAL OF 3 DOSES IN 15 MINUTES 02/04/22  Yes Gollan, Tollie Pizza, MD  omeprazole (PRILOSEC) 40 MG capsule Take 1 capsule (40 mg total) by mouth 2 (two) times  daily before a meal. 02/05/22  Yes Vanga, Loel Dubonnet, MD  sertraline (ZOLOFT) 100 MG tablet Take 150 mg by mouth daily. 11/27/21  Yes [provider]  tiotropium (SPIRIVA) 18 MCG inhalation capsule Place 18 mcg into inhaler and inhale daily.   Yes [provider]  traZODone (DESYREL) 50 MG tablet Take 50 mg by mouth at bedtime.   Yes [provider]  B-D UF III MINI PEN NEEDLES 31G X 5 MM MISC USE WITH INSULIN PEN INJECTIONS TWICE DAILY 08/05/18   [provider]    Allergies as of 01/02/2023 - Review Complete 01/02/2023  Allergen Reaction Noted   Aspirin Anaphylaxis, Shortness Of Breath, and Swelling 04/14/2015    Family History  Problem Relation Age of Onset   Diabetes Other    Hypertension Other    Diabetes Mother    Heart failure Mother  Heart disease Mother    Heart attack Mother    Stroke Mother    Depression Mother    Hypertension Mother    Cancer Sister        brain   Hypertension Sister    Diabetes Brother    Hypertension Brother    Heart failure Sister    Heart attack Sister    SIDS Sister     Social History   Socioeconomic History   Marital status: Widowed    Spouse name: Not on file   Number of children: Not on file   Years of education: Not on file   Highest education level: Not on file  Occupational History   Not on file  Tobacco Use   Smoking status: Former    Current packs/day: 0.00    Average packs/day: 0.3 packs/day for 20.0 years (5.0 ttl pk-yrs)    Types: Cigarettes    Start date: 07/01/1986    Quit date: 07/01/2006    Years since quitting: 16.5   Smokeless tobacco: Never  Vaping Use   Vaping status: Never Used  Substance and Sexual Activity   Alcohol use: No    Alcohol/week: 0.0 standard drinks of alcohol   Drug use: No   Sexual activity: Yes  Other Topics Concern   Not on file  Social History Narrative   Lives locally.  Does not routinely exercise.   Social Determinants of Health   Financial  Resource Strain: Low Risk  (02/16/2019)   Overall Financial Resource Strain (CARDIA)    Difficulty of Paying Living Expenses: Not hard at all  Food Insecurity: No Food Insecurity (01/02/2023)   Hunger Vital Sign    Worried About Running Out of Food in the Last Year: Never true    Ran Out of Food in the Last Year: Never true  Transportation Needs: No Transportation Needs (01/02/2023)   PRAPARE - Administrator, Civil Service (Medical): No    Lack of Transportation (Non-Medical): No  Physical Activity: Unknown (02/16/2019)   Exercise Vital Sign    Days of Exercise per Week: 7 days    Minutes of Exercise per Session: Not asked  Stress: No Stress Concern Present (02/16/2019)   [employer] of Occupational Health - Occupational Stress Questionnaire    Feeling of Stress : Only a little  Social Connections: Moderately Isolated (02/16/2019)   Social Connection and Isolation Panel [NHANES]    Frequency of Communication with Friends and Family: More than three times a week    Frequency of Social Gatherings with Friends and Family: More than three times a week    Attends Religious Services: More than 4 times per year    Active Member of [employer] or Organizations: No    Attends Banker Meetings: Never    Marital Status: Widowed  Intimate Partner Violence: Not At Risk (01/02/2023)   Humiliation, Afraid, Rape, and Kick questionnaire    Fear of Current or Ex-Partner: No    Emotionally Abused: No    Physically Abused: No    Sexually Abused: No    Review of Systems: See HPI, otherwise negative ROS  Physical Exam: BP (!) 170/52 (BP Location: Right Arm)   Pulse (!) 57   Temp 98.2 F (36.8 C) (Oral)   Resp 18   Ht 5\' 11"  (1.803 m)   Wt 124 kg   SpO2 100%   BMI 38.13 kg/m  General:   Alert,  pleasant and cooperative in NAD Head:  Normocephalic  and atraumatic. Neck:  Supple; no masses or thyromegaly. Lungs:  Clear throughout to auscultation, normal respiratory effort.     Heart:  +S1, +S2, Regular rate and rhythm, No edema. Abdomen:  Soft, nontender and nondistended. Normal bowel sounds, without guarding, and without rebound.   Neurologic:  Alert and  oriented x4;  grossly normal neurologically.  Impression/Plan: Tina Hayes is here for an endoscopy  to be performed for  evaluation of gi bleed    Risks, benefits, limitations, and alternatives regarding endoscopy have been reviewed with the patient.  Questions have been answered.  All parties agreeable.   Wyline Mood, MD  01/05/2023, 9:48 AM

## 2023-01-05 NOTE — Plan of Care (Signed)
The patient has an EGD this morning.  Problem: Education: Goal: Ability to describe self-care measures that may prevent or decrease complications (Diabetes Survival Skills Education) will improve Outcome: Progressing Goal: Individualized Educational Video(s) Outcome: Progressing   Problem: Coping: Goal: Ability to adjust to condition or change in health will improve Outcome: Progressing   Problem: Fluid Volume: Goal: Ability to maintain a balanced intake and output will improve Outcome: Progressing   Problem: Health Behavior/Discharge Planning: Goal: Ability to identify and utilize available resources and services will improve Outcome: Progressing Goal: Ability to manage health-related needs will improve Outcome: Progressing   Problem: Metabolic: Goal: Ability to maintain appropriate glucose levels will improve Outcome: Progressing   Problem: Nutritional: Goal: Maintenance of adequate nutrition will improve Outcome: Progressing Goal: Progress toward achieving an optimal weight will improve Outcome: Progressing

## 2023-01-05 NOTE — Plan of Care (Signed)
Resolve care plan, patient is adequate for discharge.  Tina Hayes Yossef Gilkison

## 2023-01-05 NOTE — Telephone Encounter (Signed)
I received a call from the discharge coordinator for the hospital.

## 2023-01-05 NOTE — Progress Notes (Signed)
I attempted to reach out to Tina Hayes however she is currently in the hospital secondary to some ongoing chronic medical issues.

## 2023-01-06 ENCOUNTER — Other Ambulatory Visit: Payer: Self-pay

## 2023-01-06 ENCOUNTER — Encounter: Payer: Self-pay | Admitting: Gastroenterology

## 2023-01-06 ENCOUNTER — Ambulatory Visit: Payer: Medicare PPO | Attending: Anesthesiology | Admitting: Anesthesiology

## 2023-01-06 ENCOUNTER — Telehealth: Payer: Self-pay | Admitting: Anesthesiology

## 2023-01-06 DIAGNOSIS — M5432 Sciatica, left side: Secondary | ICD-10-CM

## 2023-01-06 DIAGNOSIS — G4486 Cervicogenic headache: Secondary | ICD-10-CM

## 2023-01-06 DIAGNOSIS — M51369 Other intervertebral disc degeneration, lumbar region without mention of lumbar back pain or lower extremity pain: Secondary | ICD-10-CM

## 2023-01-06 DIAGNOSIS — M4716 Other spondylosis with myelopathy, lumbar region: Secondary | ICD-10-CM

## 2023-01-06 DIAGNOSIS — M5136 Other intervertebral disc degeneration, lumbar region: Secondary | ICD-10-CM

## 2023-01-06 DIAGNOSIS — G894 Chronic pain syndrome: Secondary | ICD-10-CM

## 2023-01-06 DIAGNOSIS — M5431 Sciatica, right side: Secondary | ICD-10-CM

## 2023-01-06 DIAGNOSIS — M47817 Spondylosis without myelopathy or radiculopathy, lumbosacral region: Secondary | ICD-10-CM

## 2023-01-06 DIAGNOSIS — F1190 Opioid use, unspecified, uncomplicated: Secondary | ICD-10-CM

## 2023-01-06 DIAGNOSIS — M48062 Spinal stenosis, lumbar region with neurogenic claudication: Secondary | ICD-10-CM

## 2023-01-06 NOTE — Telephone Encounter (Signed)
Patient called stated that she seen Dr. Pernell Dupre on yesterday. PT wanted to know have her prescription for hydrocodone been calling in.

## 2023-01-06 NOTE — Anesthesia Postprocedure Evaluation (Signed)
Anesthesia Post Note  Patient: Tina Hayes  Procedure(s) Performed: ESOPHAGOGASTRODUODENOSCOPY (EGD) WITH PROPOFOL  Patient location during evaluation: PACU Anesthesia Type: General Level of consciousness: awake and alert Pain management: pain level controlled Vital Signs Assessment: post-procedure vital signs reviewed and stable Respiratory status: spontaneous breathing, nonlabored ventilation, respiratory function stable and patient connected to nasal cannula oxygen Cardiovascular status: blood pressure returned to baseline and stable Postop Assessment: no apparent nausea or vomiting Anesthetic complications: no   No notable events documented.   Last Vitals:  Vitals:   01/05/23 1107 01/05/23 1133  BP: (!) 149/82 (!) 157/73  Pulse: (!) 55 (!) 57  Resp: 20 18  Temp:  36.7 C  SpO2: 100% 100%    Last Pain:  Vitals:   01/05/23 1107  TempSrc:   PainSc: Asleep                 Yevette Edwards

## 2023-01-06 NOTE — Telephone Encounter (Signed)
Refill sent to Dr Pernell Dupre

## 2023-01-07 ENCOUNTER — Other Ambulatory Visit: Payer: Self-pay | Admitting: *Deleted

## 2023-01-07 ENCOUNTER — Telehealth: Payer: Self-pay | Admitting: Anesthesiology

## 2023-01-07 NOTE — Telephone Encounter (Signed)
Med refill request sent to Dr Pernell Dupre, text message to remind him as well.

## 2023-01-07 NOTE — Telephone Encounter (Signed)
Patient called states that she called the pharmacy and her hydrocodone prescription hasn't been send in. PT wants to know when will Pernell Dupre be sending her prescription in. Please give patient a call. TY

## 2023-01-08 ENCOUNTER — Encounter: Payer: Self-pay | Admitting: Anesthesiology

## 2023-01-08 MED ORDER — HYDROCODONE-ACETAMINOPHEN 5-325 MG PO TABS: 1.0000 | ORAL_TABLET | Freq: Four times a day (QID) | ORAL | 0 refills | Status: AC | PRN

## 2023-01-08 NOTE — Telephone Encounter (Signed)
Rx sent by [facility] to fill today, 01/08/23

## 2023-01-08 NOTE — Progress Notes (Signed)
Multiple calls were placed to the patient for her virtual conference and I was unable to get in touch with her.  Her voice box is full.  A reschedule request has been made.

## 2023-01-26 ENCOUNTER — Ambulatory Visit: Payer: Medicare PPO | Admitting: Surgery

## 2023-02-03 ENCOUNTER — Ambulatory Visit: Payer: Medicare PPO | Attending: Anesthesiology | Admitting: Anesthesiology

## 2023-02-03 ENCOUNTER — Encounter: Payer: Self-pay | Admitting: Anesthesiology

## 2023-02-03 VITALS — BP 163/75 | HR 84 | Temp 97.2°F | Resp 18 | Ht 71.0 in | Wt 273.0 lb

## 2023-02-03 DIAGNOSIS — M5432 Sciatica, left side: Secondary | ICD-10-CM | POA: Insufficient documentation

## 2023-02-03 DIAGNOSIS — M5431 Sciatica, right side: Secondary | ICD-10-CM | POA: Insufficient documentation

## 2023-02-03 DIAGNOSIS — M4716 Other spondylosis with myelopathy, lumbar region: Secondary | ICD-10-CM | POA: Diagnosis present

## 2023-02-03 DIAGNOSIS — M48062 Spinal stenosis, lumbar region with neurogenic claudication: Secondary | ICD-10-CM | POA: Insufficient documentation

## 2023-02-03 DIAGNOSIS — F1190 Opioid use, unspecified, uncomplicated: Secondary | ICD-10-CM | POA: Diagnosis present

## 2023-02-03 DIAGNOSIS — M5136 Other intervertebral disc degeneration, lumbar region with discogenic back pain only: Secondary | ICD-10-CM | POA: Diagnosis present

## 2023-02-03 DIAGNOSIS — M47817 Spondylosis without myelopathy or radiculopathy, lumbosacral region: Secondary | ICD-10-CM | POA: Diagnosis present

## 2023-02-03 MED ORDER — NALOXONE HCL 4 MG/0.1ML NA LIQD: NASAL | 1 refills | Status: AC

## 2023-02-03 MED ORDER — HYDROCODONE-ACETAMINOPHEN 7.5-325 MG PO TABS
1.0000 | ORAL_TABLET | Freq: Four times a day (QID) | ORAL | 0 refills | Status: AC | PRN
Start: 2023-02-05 — End: 2023-03-07

## 2023-02-03 MED ORDER — HYDROCODONE-ACETAMINOPHEN 7.5-325 MG PO TABS
1.0000 | ORAL_TABLET | Freq: Four times a day (QID) | ORAL | 0 refills | Status: DC | PRN
Start: 2023-03-07 — End: 2023-04-06

## 2023-02-03 NOTE — Progress Notes (Signed)
Nursing Pain Medication Assessment:  Safety precautions to be maintained throughout the outpatient stay will include: orient to surroundings, keep bed in low position, maintain call bell within reach at all times, provide assistance with transfer out of bed and ambulation.  Medication Inspection Compliance: Tina Hayes did not comply with our request to bring her pills to be counted. She was reminded that bringing the medication bottles, even when empty, is a requirement.  Medication: None brought in. Left medications in cab Pill/Patch Count: None available to be counted. Bottle Appearance: No container available. Did not bring bottle(s) to appointment. Filled Date: N/A Last Medication intake:  Today

## 2023-02-03 NOTE — Progress Notes (Signed)
Subjective:  Patient ID: Tina Hayes, female    DOB: 01/15/1946  Age: 77 y.o. MRN: [id number]  CC: Back Pain and Shoulder Pain (bilateral)   Procedure: None  HPI Tina Hayes presents for reevaluation.  Tina Hayes continues to have severe centralized lumbar pain with associated bilateral lumbar radiculitis and sciatica symptoms.  She has been unsuccessful losing weight and continues to rely on intermittent ambulatory wheelchair assistance.  She describes a pain that continues to be gnawing aching primarily located in the low back with radiation in the hip buttock and down the posterior lateral leg.  She has had previous epidural injections and generally she describes 3 months of 75 to 80% reduction in her sciatica pain and a 50% reduction in her low back pain.  She has been taking 5 mg hydrocodone 4 times a day and feels that these are much less effective than the 7.5 mg tablets she has been taking 4 months ago.  She would like to return to that.  No change in bowel or bladder function or the quality characteristic or distribution of the pain is noted.  Outpatient Medications Prior to Visit  Medication Sig Dispense Refill   acetaminophen (TYLENOL) 500 MG tablet Take 500 mg by mouth every 6 (six) hours as needed for headache, moderate pain or fever.     albuterol (PROVENTIL HFA;VENTOLIN HFA) 108 (90 Base) MCG/ACT inhaler Inhale 2 puffs into the lungs every 6 (six) hours as needed for wheezing or shortness of breath. 1 Inhaler 2   albuterol (PROVENTIL) (2.5 MG/3ML) 0.083% nebulizer solution Take 2.5 mg every 4 (four) hours as needed by nebulization for wheezing or shortness of breath.     amitriptyline (ELAVIL) 25 MG tablet Take 25 mg by mouth at bedtime.     amLODipine (NORVASC) 5 MG tablet Take 1 tablet by mouth once daily 90 tablet 1   atorvastatin (LIPITOR) 40 MG tablet Take 1 tablet (40 mg total) by mouth daily. 90 tablet 3   B-D UF III MINI PEN NEEDLES 31G X 5 MM MISC USE WITH INSULIN PEN  INJECTIONS TWICE DAILY     carvedilol (COREG) 3.125 MG tablet Take 1 tablet (3.125 mg total) by mouth 2 (two) times daily with a meal. 90 tablet 1   divalproex (DEPAKOTE) 250 MG DR tablet Take 250 mg by mouth 2 (two) times daily.     DUPIXENT 300 MG/2ML SOPN Inject into the skin.     Ferrous Sulfate (IRON) 325 (65 Fe) MG TABS Take 1 tablet by mouth daily.     HYDROcodone-acetaminophen (NORCO/VICODIN) 5-325 MG tablet Take 1 tablet by mouth every 6 (six) hours as needed. 120 tablet 0   insulin aspart (NOVOLOG) 100 UNIT/ML FlexPen Inject 8 units subQ if blood sugar is consistently over 300 for more than 6 hours despite taking your other prescribed insulin. Repeat up to three times a day (every 8 hours) until blood sugars improve. 15 mL 0   LANTUS SOLOSTAR 100 UNIT/ML Solostar Pen Inject 10 Units into the skin See admin instructions. Inject 30 units subcutaneously in the morning at 35 units subcutaneously at bedtime. 15 mL 11   levETIRAcetam (KEPPRA) 750 MG tablet Take 1,500 mg by mouth 2 (two) times daily.     losartan (COZAAR) 100 MG tablet Take 1 tablet by mouth once daily 90 tablet 3   montelukast (SINGULAIR) 10 MG tablet Take 10 mg by mouth daily.     nitroGLYCERIN (NITROSTAT) 0.4 MG SL tablet DISSOLVE ONE TABLET  UNDER THE TONGUE EVERY 5 MINUTES AS NEEDED FOR CHEST PAIN.  DO NOT EXCEED A TOTAL OF 3 DOSES IN 15 MINUTES 25 tablet 0   omeprazole (PRILOSEC) 40 MG capsule Take 1 capsule (40 mg total) by mouth 2 (two) times daily before a meal. 180 capsule 3   sertraline (ZOLOFT) 100 MG tablet Take 150 mg by mouth daily.     sucralfate (CARAFATE) 1 GM/10ML suspension Take 10 mLs (1 g total) by mouth 4 (four) times daily -  with meals and at bedtime. 420 mL 0   tiotropium (SPIRIVA) 18 MCG inhalation capsule Place 18 mcg into inhaler and inhale daily.     traZODone (DESYREL) 50 MG tablet Take 50 mg by mouth at bedtime.     No facility-administered medications prior to visit.    Review of Systems CNS:  No confusion or sedation Cardiac: No angina or palpitations GI: No abdominal pain or constipation Constitutional: No nausea vomiting fevers or chills  Objective:  BP (!) 163/75   Pulse 84   Temp (!) 97.2 F (36.2 C)   Resp 18   Ht 5\' 11"  (1.803 m)   Wt 273 lb (123.8 kg)   SpO2 100%   BMI 38.08 kg/m    BP Readings from Last 3 Encounters:  02/03/23 (!) 163/75  01/05/23 (!) 157/73  12/11/22 (!) 138/94     Wt Readings from Last 3 Encounters:  02/03/23 273 lb (123.8 kg)  01/02/23 273 lb 5.9 oz (124 kg)  12/10/22 273 lb 6.4 oz (124 kg)     Physical Exam Pt is alert and oriented PERRL EOMI HEART IS RRR no murmur or rub LCTA no wheezing or rales MUSCULOSKELETAL reveals some paraspinous muscle tenderness but no overt trigger points.  Muscle tone and bulk is at baseline.  She is in a wheelchair for ambulation today  Labs  Lab Results  Component Value Date   HGBA1C 8.7 (H) 01/02/2023   HGBA1C 7.7 (H) 02/24/2021   HGBA1C 6.5 (H) 01/18/2019   Lab Results  Component Value Date   LDLCALC 58 01/30/2017   CREATININE 0.75 01/03/2023    -------------------------------------------------------------------------------------------------------------------- Lab Results  Component Value Date   WBC 4.2 01/03/2023   HGB 9.5 (L) 01/04/2023   HCT 25.9 (L) 01/03/2023   PLT 198 01/03/2023   GLUCOSE 109 (H) 01/03/2023   CHOL 120 01/30/2017   TRIG 73 01/30/2017   HDL 47 01/30/2017   LDLCALC 58 01/30/2017   ALT 12 01/03/2023   AST 13 (L) 01/03/2023   NA 142 01/03/2023   K 4.1 01/03/2023   CL 108 01/03/2023   CREATININE 0.75 01/03/2023   BUN 24 (H) 01/03/2023   CO2 28 01/03/2023   TSH 2.640 07/13/2020   INR 1.0 01/02/2023   HGBA1C 8.7 (H) 01/02/2023    --------------------------------------------------------------------------------------------------------------------- No results found.   Assessment & Plan:   Tina Hayes was seen today for back pain and shoulder  pain.  Diagnoses and all orders for this visit:  Degeneration of intervertebral disc of lumbar region with discogenic back pain -     Lumbar Epidural Injection; Future  Bilateral sciatica -     Lumbar Epidural Injection; Future  Spinal stenosis of lumbar region with neurogenic claudication -     Lumbar Epidural Injection; Future  Facet arthritis of lumbosacral region  Lumbar spondylosis with myelopathy  Chronic, continuous use of opioids  Morbid obesity (HCC)  Other orders -     HYDROcodone-acetaminophen (NORCO) 7.5-325 MG tablet; Take 1 tablet  by mouth every 6 (six) hours as needed for moderate pain or severe pain. -     HYDROcodone-acetaminophen (NORCO) 7.5-325 MG tablet; Take 1 tablet by mouth every 6 (six) hours as needed for moderate pain or severe pain. -     naloxone (NARCAN) nasal spray 4 mg/0.1 mL; As directed for opioid induced respiratory depression        ----------------------------------------------------------------------------------------------------------------------  Problem List Items Addressed This Visit       Unprioritized   Morbid obesity (HCC)   Other Visit Diagnoses     Degeneration of intervertebral disc of lumbar region with discogenic back pain    -  Primary   Relevant Medications   HYDROcodone-acetaminophen (NORCO) 7.5-325 MG tablet (Start on 02/05/2023)   HYDROcodone-acetaminophen (NORCO) 7.5-325 MG tablet (Start on 03/07/2023)   Other Relevant Orders   Lumbar Epidural Injection   Bilateral sciatica       Relevant Orders   Lumbar Epidural Injection   Spinal stenosis of lumbar region with neurogenic claudication       Relevant Medications   HYDROcodone-acetaminophen (NORCO) 7.5-325 MG tablet (Start on 02/05/2023)   HYDROcodone-acetaminophen (NORCO) 7.5-325 MG tablet (Start on 03/07/2023)   Other Relevant Orders   Lumbar Epidural Injection   Facet arthritis of lumbosacral region       Relevant Medications   HYDROcodone-acetaminophen  (NORCO) 7.5-325 MG tablet (Start on 02/05/2023)   HYDROcodone-acetaminophen (NORCO) 7.5-325 MG tablet (Start on 03/07/2023)   Lumbar spondylosis with myelopathy       Relevant Medications   HYDROcodone-acetaminophen (NORCO) 7.5-325 MG tablet (Start on 02/05/2023)   HYDROcodone-acetaminophen (NORCO) 7.5-325 MG tablet (Start on 03/07/2023)   Chronic, continuous use of opioids             ----------------------------------------------------------------------------------------------------------------------  1. Degeneration of intervertebral disc of lumbar region with discogenic back pain Continue efforts at weight loss with scheduled epidural injection within the next 2 months.  We have gone over the risks and benefits of the procedure with her in full detail.  She generally does quite well with epidural injections generally getting 3 months of 50% reduction in her low back pain and 75% reduction in her leg pain.  Continue efforts at weight loss and we will switch her back to the 7.5 mg hydrocodone tablet to assist with pain relief. - Lumbar Epidural Injection; Future  2. Bilateral sciatica As above - Lumbar Epidural Injection; Future  3. Spinal stenosis of lumbar region with neurogenic claudication As above - Lumbar Epidural Injection; Future  4. Facet arthritis of lumbosacral region   5. Lumbar spondylosis with myelopathy   6. Chronic, continuous use of opioids I have reviewed the [facility] practitioner database information is appropriate.  She continues to derive functional improvement and better lifestyle functionality with chronic opioid management with no side effects reported.  7. Morbid obesity (HCC) Continue efforts at weight loss and continue follow-up with her primary care physicians for baseline medical care with scheduled return to clinic in 2  months.    ----------------------------------------------------------------------------------------------------------------------  I am having Oleh Genin. Lewison start on HYDROcodone-acetaminophen, HYDROcodone-acetaminophen, and naloxone. I am also having her maintain her tiotropium, traZODone, albuterol, albuterol, levETIRAcetam, B-D UF III MINI PEN NEEDLES, acetaminophen, Dupixent, sertraline, amitriptyline, montelukast, atorvastatin, nitroGLYCERIN, omeprazole, losartan, carvedilol, divalproex, amLODipine, insulin aspart, Iron, Lantus SoloStar, sucralfate, and HYDROcodone-acetaminophen.   Meds ordered this encounter  Medications   HYDROcodone-acetaminophen (NORCO) 7.5-325 MG tablet    Sig: Take 1 tablet by mouth every 6 (six) hours as needed for  moderate pain or severe pain.    Dispense:  120 tablet    Refill:  0   HYDROcodone-acetaminophen (NORCO) 7.5-325 MG tablet    Sig: Take 1 tablet by mouth every 6 (six) hours as needed for moderate pain or severe pain.    Dispense:  120 tablet    Refill:  0   naloxone (NARCAN) nasal spray 4 mg/0.1 mL    Sig: As directed for opioid induced respiratory depression    Dispense:  1 each    Refill:  1   Patient's Medications  New Prescriptions   HYDROCODONE-ACETAMINOPHEN (NORCO) 7.5-325 MG TABLET    Take 1 tablet by mouth every 6 (six) hours as needed for moderate pain or severe pain.   HYDROCODONE-ACETAMINOPHEN (NORCO) 7.5-325 MG TABLET    Take 1 tablet by mouth every 6 (six) hours as needed for moderate pain or severe pain.   NALOXONE (NARCAN) NASAL SPRAY 4 MG/0.1 ML    As directed for opioid induced respiratory depression  Previous Medications   ACETAMINOPHEN (TYLENOL) 500 MG TABLET    Take 500 mg by mouth every 6 (six) hours as needed for headache, moderate pain or fever.   ALBUTEROL (PROVENTIL HFA;VENTOLIN HFA) 108 (90 BASE) MCG/ACT INHALER    Inhale 2 puffs into the lungs every 6 (six) hours as needed for wheezing or shortness of breath.    ALBUTEROL (PROVENTIL) (2.5 MG/3ML) 0.083% NEBULIZER SOLUTION    Take 2.5 mg every 4 (four) hours as needed by nebulization for wheezing or shortness of breath.   AMITRIPTYLINE (ELAVIL) 25 MG TABLET    Take 25 mg by mouth at bedtime.   AMLODIPINE (NORVASC) 5 MG TABLET    Take 1 tablet by mouth once daily   ATORVASTATIN (LIPITOR) 40 MG TABLET    Take 1 tablet (40 mg total) by mouth daily.   B-D UF III MINI PEN NEEDLES 31G X 5 MM MISC    USE WITH INSULIN PEN INJECTIONS TWICE DAILY   CARVEDILOL (COREG) 3.125 MG TABLET    Take 1 tablet (3.125 mg total) by mouth 2 (two) times daily with a meal.   DIVALPROEX (DEPAKOTE) 250 MG DR TABLET    Take 250 mg by mouth 2 (two) times daily.   DUPIXENT 300 MG/2ML SOPN    Inject into the skin.   FERROUS SULFATE (IRON) 325 (65 FE) MG TABS    Take 1 tablet by mouth daily.   HYDROCODONE-ACETAMINOPHEN (NORCO/VICODIN) 5-325 MG TABLET    Take 1 tablet by mouth every 6 (six) hours as needed.   INSULIN ASPART (NOVOLOG) 100 UNIT/ML FLEXPEN    Inject 8 units subQ if blood sugar is consistently over 300 for more than 6 hours despite taking your other prescribed insulin. Repeat up to three times a day (every 8 hours) until blood sugars improve.   LANTUS SOLOSTAR 100 UNIT/ML SOLOSTAR PEN    Inject 10 Units into the skin See admin instructions. Inject 30 units subcutaneously in the morning at 35 units subcutaneously at bedtime.   LEVETIRACETAM (KEPPRA) 750 MG TABLET    Take 1,500 mg by mouth 2 (two) times daily.   LOSARTAN (COZAAR) 100 MG TABLET    Take 1 tablet by mouth once daily   MONTELUKAST (SINGULAIR) 10 MG TABLET    Take 10 mg by mouth daily.   NITROGLYCERIN (NITROSTAT) 0.4 MG SL TABLET    DISSOLVE ONE TABLET UNDER THE TONGUE EVERY 5 MINUTES AS NEEDED FOR CHEST PAIN.  DO NOT EXCEED A  TOTAL OF 3 DOSES IN 15 MINUTES   OMEPRAZOLE (PRILOSEC) 40 MG CAPSULE    Take 1 capsule (40 mg total) by mouth 2 (two) times daily before a meal.   SERTRALINE (ZOLOFT) 100 MG TABLET    Take 150 mg  by mouth daily.   SUCRALFATE (CARAFATE) 1 GM/10ML SUSPENSION    Take 10 mLs (1 g total) by mouth 4 (four) times daily -  with meals and at bedtime.   TIOTROPIUM (SPIRIVA) 18 MCG INHALATION CAPSULE    Place 18 mcg into inhaler and inhale daily.   TRAZODONE (DESYREL) 50 MG TABLET    Take 50 mg by mouth at bedtime.  Modified Medications   No medications on file  Discontinued Medications   No medications on file   ----------------------------------------------------------------------------------------------------------------------  Follow-up: Return in about 2 months (around 04/05/2023) for evaluation, med refill, procedure.    Yevette Edwards, MD

## 2023-02-12 ENCOUNTER — Ambulatory Visit: Payer: Medicare PPO | Admitting: Gastroenterology

## 2023-02-20 ENCOUNTER — Ambulatory Visit: Payer: Medicare PPO | Admitting: Cardiovascular Disease

## 2023-02-25 ENCOUNTER — Ambulatory Visit: Payer: Medicare PPO | Admitting: Medical

## 2023-03-02 ENCOUNTER — Ambulatory Visit: Payer: Medicare PPO | Admitting: Gastroenterology

## 2023-03-02 ENCOUNTER — Telehealth: Payer: Self-pay | Admitting: Gastroenterology

## 2023-03-02 NOTE — Telephone Encounter (Signed)
Patient called and left a voicemail wanting to know if she had appointment with [place]. She will like to know why she has appointment with our practice. I called patient back to let him know we received his message. I reschedule her with DR. Vanga on 05/03/22 at 1:45 pm.

## 2023-03-11 NOTE — Progress Notes (Deleted)
Cardiology Office Note:    Date:  03/11/2023   ID:  [name], DOB 12-06-1945, MRN [id number]  PCP:  Gorden Harms, PA-C  [facility] Cardiologist:  Julien Nordmann, MD  [facility] Electrophysiologist:  None   Referring MD: Gorden Harms, PA-C   Chief Complaint: ***  History of Present Illness:    Tina Hayes is a 77 y.o. 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    The patient was seen in 2018 for intermittent chest and shoulder pain and tenderness for many months, which turned out to be GI issues. Since then she has been followed by the office. Echo 01/2019 showed EF 60%, mildly dilated right heart, moderate MR.    Seen in the ER 07/02/20 for chest pain, low suspicion for ACS. She had run out of her PPI. Cardiac work-up was negative, suspected acid-relfux and Nexium was refilled.    Patient seen 07/13/20 and reported lightheadedness, dizziness, and chest pressure. EKG showed bradycardia and coreg was discontinued. Echo and heart monitor ordered. Echo showed normal LV function, mild LVH, G1DD, mild to mod AS. Heart monitor showed SB, brief SVT and NSVT. Orthostatics negative. There was question of metolazone and torsemide dosing. Previous labs showed she was dry. Given normal EF and labs, metolazone was discontinued.   The patient was last seen 01/2022 and was stable from a cardiac perspective.   Today,    Past Medical History:  Diagnosis Date   (HFpEF) heart failure with preserved ejection fraction (HCC)    a. 05/2016 Echo: EF 60-65%, mild to mod LVH, Gr1 DD, mild MR, mildly dil LA, mod TR, mildly to mod increased PASP; b. 07/2020 Echo: EF 55-60%, no rwma, mild LVH, GrIDD, mildly dil LA, mild MR, mild-mod TR, mild-mod AoV sclerosis w/o stenosis.   Anxiety    Arthritis    Chronic back pain    COPD (chronic  obstructive pulmonary disease) (HCC)    Coronary artery disease    a. s/p remote PCI x 5;  b. 2006 s/p CABG x 3 ([place], [place] - [facility]); b. 05/2016 MV: attenuation corrected images w/o ischemia or wma-->Med rx; c. 06/2020 MV: EF 55-65%, no isch/infart-->low risk.   Depression    Diabetes mellitus without complication (HCC)    Essential hypertension 06/30/2016   GERD (gastroesophageal reflux disease)    Heart attack (HCC)    Total of 3 per pt.   Hyperlipidemia LDL goal <55    Hypertensive urgency 06/03/2015   PAD (peripheral artery disease) (HCC)    a. 06/2019 Angio: R AT (PTA), R POP (PTA & Viabahn stenting); b. 09/2020 ABIs: R 1.24, L 1.35.   Palpitations    a. 06/2020 Zio: Sinus brady, 54 (40-143), 4 beats NSVT, 3 SVT runs (max 143 bpm x 4 beats).   Seizure [facility])     Past Surgical History:  Procedure Laterality Date   ABDOMINAL HYSTERECTOMY     ANKLE SURGERY Right    CATARACT EXTRACTION W/ INTRAOCULAR LENS  IMPLANT, BILATERAL     COLONOSCOPY WITH PROPOFOL N/A 05/04/2017   Procedure: COLONOSCOPY WITH PROPOFOL;  Surgeon: Scot Jun, MD;  Location: [facility] ENDOSCOPY;  Service: Endoscopy;  Laterality: N/A;   CORONARY ANGIOPLASTY     CORONARY ARTERY BYPASS GRAFT     TRIPLE BYPASS   ESOPHAGOGASTRODUODENOSCOPY  N/A 02/25/2021   Procedure: ESOPHAGOGASTRODUODENOSCOPY (EGD);  Surgeon: Toney Reil, MD;  Location: [facility] ENDOSCOPY;  Service: Gastroenterology;  Laterality: N/A;   ESOPHAGOGASTRODUODENOSCOPY (EGD) WITH PROPOFOL N/A 05/04/2017   Procedure: ESOPHAGOGASTRODUODENOSCOPY (EGD) WITH PROPOFOL;  Surgeon: Scot Jun, MD;  Location: [facility] ENDOSCOPY;  Service: Endoscopy;  Laterality: N/A;   ESOPHAGOGASTRODUODENOSCOPY (EGD) WITH PROPOFOL N/A 01/05/2023   Procedure: ESOPHAGOGASTRODUODENOSCOPY (EGD) WITH PROPOFOL;  Surgeon: Wyline Mood, MD;  Location[facility] ENDOSCOPY;  Service: Gastroenterology;  Laterality: N/A;   EXCISION MASS LOWER EXTREMETIES Right 03/02/2018    Procedure: EXCISION SOFT TISSUE MASS FROM MEDIAL ASPECT OF RIGHT ANKLE;  Surgeon: Christena Flake, MD;  Location: [facility] ORS;  Service: Orthopedics;  Laterality: Right;   FRACTURE SURGERY     JOINT REPLACEMENT     right   KNEE SURGERY Right    LOWER EXTREMITY ANGIOGRAPHY Right 07/14/2019   Procedure: LOWER EXTREMITY ANGIOGRAPHY;  Surgeon: Annice Needy, MD;  Location: [facility] INVASIVE CV LAB;  Service: Cardiovascular;  Laterality: Right;   MOUTH SURGERY Left 07/14/2017   TOTAL KNEE ARTHROPLASTY Left 09/27/2019   Procedure: TOTAL KNEE ARTHROPLASTY;  Surgeon: Christena Flake, MD;  Location: [facility] ORS;  Service: Orthopedics;  Laterality: Left;    Current Medications: No outpatient medications have been marked as taking for the 03/12/23 encounter (Appointment) with Fransico Michael, Dylyn Mclaren H, PA-C.     Allergies:   Aspirin   Social History   Socioeconomic History   Marital status: Widowed    Spouse name: Not on file   Number of children: Not on file   Years of education: Not on file   Highest education level: Not on file  Occupational History   Not on file  Tobacco Use   Smoking status: Former    Current packs/day: 0.00    Average packs/day: 0.3 packs/day for 20.0 years (5.0 ttl pk-yrs)    Types: Cigarettes    Start date: 07/01/1986    Quit date: 07/01/2006    Years since quitting: 16.7   Smokeless tobacco: Never  Vaping Use   Vaping status: Never Used  Substance and Sexual Activity   Alcohol use: No    Alcohol/week: 0.0 standard drinks of alcohol   Drug use: No   Sexual activity: Yes  Other Topics Concern   Not on file  Social History Narrative   Lives locally.  Does not routinely exercise.   Social Determinants of Health   Financial Resource Strain: Low Risk  (02/16/2019)   Overall Financial Resource Strain (CARDIA)    Difficulty of Paying Living Expenses: Not hard at all  Food Insecurity: No Food Insecurity (01/02/2023)   Hunger Vital Sign    Worried About Running Out of Food in the Last  Year: Never true    Ran Out of Food in the Last Year: Never true  Transportation Needs: No Transportation Needs (01/02/2023)   PRAPARE - Administrator, Civil Service (Medical): No    Lack of Transportation (Non-Medical): No  Physical Activity: Unknown (02/16/2019)   Exercise Vital Sign    Days of Exercise per Week: 7 days    Minutes of Exercise per Session: Not asked  Stress: No Stress Concern Present (02/16/2019)   [employer] of Occupational Health - Occupational Stress Questionnaire    Feeling of Stress : Only a little  Social Connections: Moderately Isolated (02/16/2019)   Social Connection and Isolation Panel [NHANES]    Frequency of Communication with Friends and Family: More than three times a week  Frequency of Social Gatherings with Friends and Family: More than three times a week    Attends Religious Services: More than 4 times per year    Active Member of Clubs or Organizations: No    Attends Banker Meetings: Never    Marital Status: Widowed     Family History: The patient's ***family history includes Cancer in her sister; Depression in her mother; Diabetes in her brother, mother, and another family member; Heart attack in her mother and sister; Heart disease in her mother; Heart failure in her mother and sister; Hypertension in her brother, mother, sister, and another family member; SIDS in her sister; Stroke in her mother.  ROS:   Please see the history of present illness.    *** All other systems reviewed and are negative.  EKGs/Labs/Other Studies Reviewed:    The following studies were reviewed today: ***  EKG:  EKG is *** ordered today.  The ekg ordered today demonstrates ***  Recent Labs: 01/02/2023: Magnesium 2.1 01/03/2023: ALT 12; BUN 24; Creatinine, Ser 0.75; Platelets 198; Potassium 4.1; Sodium 142 01/04/2023: Hemoglobin 9.5  Recent Lipid Panel    Component Value Date/Time   CHOL 120 01/30/2017 0404   TRIG 73 01/30/2017 0404    HDL 47 01/30/2017 0404   CHOLHDL 2.6 01/30/2017 0404   VLDL 15 01/30/2017 0404   LDLCALC 58 01/30/2017 0404     Risk Assessment/Calculations:   {Does this patient have ATRIAL FIBRILLATION?:[id number]}   Physical Exam:    VS:  There were no vitals taken for this visit.    Wt Readings from Last 3 Encounters:  02/03/23 273 lb (123.8 kg)  01/02/23 273 lb 5.9 oz (124 kg)  12/10/22 273 lb 6.4 oz (124 kg)     GEN: *** Well nourished, well developed in no acute distress HEENT: Normal NECK: No JVD; No carotid bruits LYMPHATICS: No lymphadenopathy CARDIAC: ***RRR, no murmurs, rubs, gallops RESPIRATORY:  Clear to auscultation without rales, wheezing or rhonchi  ABDOMEN: Soft, non-tender, non-distended MUSCULOSKELETAL:  No edema; No deformity  SKIN: Warm and dry NEUROLOGIC:  Alert and oriented x 3 PSYCHIATRIC:  Normal affect   ASSESSMENT:    No diagnosis found. PLAN:    In order of problems listed above:  ***  Disposition: Follow up {follow up:15908} with ***   Shared Decision Making/Informed Consent   {Are you ordering a CV Procedure (e.g. stress test, cath, DCCV, TEE, etc)?   Press F2        :[id number]}    Signed, Aila Terra David Stall, PA-C  03/11/2023 3:24 PM    [facility]

## 2023-03-12 ENCOUNTER — Ambulatory Visit: Payer: Medicare PPO | Admitting: Medical

## 2023-03-20 ENCOUNTER — Encounter: Payer: Self-pay | Admitting: Medical

## 2023-03-20 ENCOUNTER — Ambulatory Visit: Payer: Medicare PPO | Attending: Medical | Admitting: Medical

## 2023-03-20 VITALS — BP 135/83 | HR 69 | Ht 71.0 in | Wt 265.8 lb

## 2023-03-20 DIAGNOSIS — R079 Chest pain, unspecified: Secondary | ICD-10-CM | POA: Diagnosis not present

## 2023-03-20 DIAGNOSIS — I10 Essential (primary) hypertension: Secondary | ICD-10-CM | POA: Diagnosis not present

## 2023-03-20 DIAGNOSIS — I25119 Atherosclerotic heart disease of native coronary artery with unspecified angina pectoris: Secondary | ICD-10-CM | POA: Diagnosis not present

## 2023-03-20 DIAGNOSIS — I209 Angina pectoris, unspecified: Secondary | ICD-10-CM

## 2023-03-20 DIAGNOSIS — I5032 Chronic diastolic (congestive) heart failure: Secondary | ICD-10-CM

## 2023-03-20 MED ORDER — CARVEDILOL 3.125 MG PO TABS: 3.1250 mg | ORAL_TABLET | Freq: Two times a day (BID) | ORAL | 1 refills | Status: DC

## 2023-03-20 NOTE — Progress Notes (Unsigned)
Cardiology Office Note:    Date:  03/22/2023   ID:  Tina Hayes, DOB May 15, 1945, MRN [id number]  PCP:  Gorden Harms, PA-C  [facility] Cardiologist:  Julien Nordmann, MD  [facility] Electrophysiologist:  None   Referring MD: Gorden Harms, PA-C   Chief Complaint: follow-up  History of Present Illness:    Tina Hayes is a 77 y.o. 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    The patient was seen in 2018 for intermittent chest and shoulder pain and tenderness for many months, which turned out to be GI issues. Since then she has been followed by the office. Echo 01/2019 showed EF 60%, mildly dilated right heart, moderate MR.    Seen in the ER 07/02/20 for chest pain, low suspicion for ACS. She had run out of her PPI. Cardiac work-up was negative, suspected acid-relfux and Nexium was refilled.    Patient seen 07/13/20 and reported lightheadedness, dizziness, and chest pressure. EKG showed bradycardia and coreg was discontinued. Echo and heart monitor ordered. Echo showed normal LV function, mild LVH, G1DD, mild to mod AS. Heart monitor showed SB, brief SVT and NSVT. Orthostatics negative. There was question of metolazone and torsemide dosing. Previous labs showed she was dry. Given normal EF and labs, metolazone was discontinued.   The patient was last seen in October 2023 for preop cardiovascular evaluation for EGD.  It was recommended she hold Plavix 5 days prior to procedure.  Patient was overall stable from a cardiac perspective.  Today, the patient reports she has good and bad days. She started having chest pain and SOB that started about 3-4 months. Pain is reproducible on exam, but also says it gets worse on exertion. She ran out of Coreg. She has occasional lower leg edema, however it appears she is not on a  diuretic.    Past Medical History:  Diagnosis Date   (HFpEF) heart failure with preserved ejection fraction (HCC)    a. 05/2016 Echo: EF 60-65%, mild to mod LVH, Gr1 DD, mild MR, mildly dil LA, mod TR, mildly to mod increased PASP; b. 07/2020 Echo: EF 55-60%, no rwma, mild LVH, GrIDD, mildly dil LA, mild MR, mild-mod TR, mild-mod AoV sclerosis w/o stenosis.   Anxiety    Arthritis    Chronic back pain    COPD (chronic obstructive pulmonary disease) (HCC)    Coronary artery disease    a. s/p remote PCI x 5;  b. 2006 s/p CABG x 3 ([place], [place] [facility]); b. 05/2016 MV: attenuation corrected images w/o ischemia or wma-->Med rx; c. 06/2020 MV: EF 55-65%, no isch/infart-->low risk.   Depression    Diabetes mellitus without complication (HCC)    Essential hypertension 06/30/2016   GERD (gastroesophageal reflux disease)    Heart attack (HCC)    Total of 3 per pt.   Hyperlipidemia LDL goal <55    Hypertensive urgency 06/03/2015   PAD (peripheral artery disease) (HCC)    a. 06/2019 Angio: R AT (PTA), R POP (PTA & Viabahn stenting); b. 09/2020 ABIs: R 1.24, L 1.35.   Palpitations    a. 06/2020 Zio: Sinus brady, 54 (40-143), 4 beats NSVT, 3 SVT runs (max 143 bpm x 4 beats).   Seizure [facility])     Past Surgical History:  Procedure Laterality  Date   ABDOMINAL HYSTERECTOMY     ANKLE SURGERY Right    CATARACT EXTRACTION W/ INTRAOCULAR LENS  IMPLANT, BILATERAL     COLONOSCOPY WITH PROPOFOL N/A 05/04/2017   Procedure: COLONOSCOPY WITH PROPOFOL;  Surgeon: Scot Jun, MD;  Location: [facility] ENDOSCOPY;  Service: Endoscopy;  Laterality: N/A;   CORONARY ANGIOPLASTY     CORONARY ARTERY BYPASS GRAFT     TRIPLE BYPASS   ESOPHAGOGASTRODUODENOSCOPY N/A 02/25/2021   Procedure: ESOPHAGOGASTRODUODENOSCOPY (EGD);  Surgeon: Toney Reil, MD;  Location: [facility] ENDOSCOPY;  Service: Gastroenterology;  Laterality: N/A;   ESOPHAGOGASTRODUODENOSCOPY (EGD) WITH PROPOFOL N/A 05/04/2017   Procedure:  ESOPHAGOGASTRODUODENOSCOPY (EGD) WITH PROPOFOL;  Surgeon: Scot Jun, MD;  Location: [facility] ENDOSCOPY;  Service: Endoscopy;  Laterality: N/A;   ESOPHAGOGASTRODUODENOSCOPY (EGD) WITH PROPOFOL N/A 01/05/2023   Procedure: ESOPHAGOGASTRODUODENOSCOPY (EGD) WITH PROPOFOL;  Surgeon: Wyline Mood, MD;  Location: [facility] ENDOSCOPY;  Service: Gastroenterology;  Laterality: N/A;   EXCISION MASS LOWER EXTREMETIES Right 03/02/2018   Procedure: EXCISION SOFT TISSUE MASS FROM MEDIAL ASPECT OF RIGHT ANKLE;  Surgeon: Christena Flake, MD;  Location: [facility] ORS;  Service: Orthopedics;  Laterality: Right;   FRACTURE SURGERY     JOINT REPLACEMENT     right   KNEE SURGERY Right    LOWER EXTREMITY ANGIOGRAPHY Right 07/14/2019   Procedure: LOWER EXTREMITY ANGIOGRAPHY;  Surgeon: Annice Needy, MD;  Location: [facility] INVASIVE CV LAB;  Service: Cardiovascular;  Laterality: Right;   MOUTH SURGERY Left 07/14/2017   TOTAL KNEE ARTHROPLASTY Left 09/27/2019   Procedure: TOTAL KNEE ARTHROPLASTY;  Surgeon: Christena Flake, MD;  Location: [facility] ORS;  Service: Orthopedics;  Laterality: Left;    Current Medications: Current Meds  Medication Sig   acetaminophen (TYLENOL) 500 MG tablet Take 500 mg by mouth every 6 (six) hours as needed for headache, moderate pain or fever.   albuterol (PROVENTIL HFA;VENTOLIN HFA) 108 (90 Base) MCG/ACT inhaler Inhale 2 puffs into the lungs every 6 (six) hours as needed for wheezing or shortness of breath.   albuterol (PROVENTIL) (2.5 MG/3ML) 0.083% nebulizer solution Take 2.5 mg every 4 (four) hours as needed by nebulization for wheezing or shortness of breath.   amitriptyline (ELAVIL) 25 MG tablet Take 25 mg by mouth at bedtime.   amLODipine (NORVASC) 5 MG tablet Take 1 tablet by mouth once daily   atorvastatin (LIPITOR) 40 MG tablet Take 1 tablet (40 mg total) by mouth daily.   B-D UF III MINI PEN NEEDLES 31G X 5 MM MISC USE WITH INSULIN PEN INJECTIONS TWICE DAILY   divalproex (DEPAKOTE) 250 MG DR tablet Take  250 mg by mouth 2 (two) times daily.   DUPIXENT 300 MG/2ML SOPN Inject into the skin.   Ferrous Sulfate (IRON) 325 (65 Fe) MG TABS Take 1 tablet by mouth daily.   HYDROcodone-acetaminophen (NORCO) 7.5-325 MG tablet Take 1 tablet by mouth every 6 (six) hours as needed for moderate pain or severe pain.   insulin aspart (NOVOLOG) 100 UNIT/ML FlexPen Inject 8 units subQ if blood sugar is consistently over 300 for more than 6 hours despite taking your other prescribed insulin. Repeat up to three times a day (every 8 hours) until blood sugars improve.   LANTUS SOLOSTAR 100 UNIT/ML Solostar Pen Inject 10 Units into the skin See admin instructions. Inject 30 units subcutaneously in the morning at 35 units subcutaneously at bedtime.   levETIRAcetam (KEPPRA) 750 MG tablet Take 1,500 mg by mouth 2 (two) times daily.   losartan (COZAAR) 100  MG tablet Take 1 tablet by mouth once daily   montelukast (SINGULAIR) 10 MG tablet Take 10 mg by mouth daily.   naloxone (NARCAN) nasal spray 4 mg/0.1 mL As directed for opioid induced respiratory depression   nitroGLYCERIN (NITROSTAT) 0.4 MG SL tablet DISSOLVE ONE TABLET UNDER THE TONGUE EVERY 5 MINUTES AS NEEDED FOR CHEST PAIN.  DO NOT EXCEED A TOTAL OF 3 DOSES IN 15 MINUTES   omeprazole (PRILOSEC) 40 MG capsule Take 1 capsule (40 mg total) by mouth 2 (two) times daily before a meal.   sucralfate (CARAFATE) 1 GM/10ML suspension Take 10 mLs (1 g total) by mouth 4 (four) times daily -  with meals and at bedtime.   tiotropium (SPIRIVA) 18 MCG inhalation capsule Place 18 mcg into inhaler and inhale daily.   traZODone (DESYREL) 50 MG tablet Take 50 mg by mouth at bedtime.   [DISCONTINUED] carvedilol (COREG) 3.125 MG tablet Take 1 tablet (3.125 mg total) by mouth 2 (two) times daily with a meal.     Allergies:   Aspirin   Social History   Socioeconomic History   Marital status: Widowed    Spouse name: Not on file   Number of children: Not on file   Years of education:  Not on file   Highest education level: Not on file  Occupational History   Not on file  Tobacco Use   Smoking status: Former    Current packs/day: 0.00    Average packs/day: 0.3 packs/day for 20.0 years (5.0 ttl pk-yrs)    Types: Cigarettes    Start date: 07/01/1986    Quit date: 07/01/2006    Years since quitting: 16.7   Smokeless tobacco: Never  Vaping Use   Vaping status: Never Used  Substance and Sexual Activity   Alcohol use: No    Alcohol/week: 0.0 standard drinks of alcohol   Drug use: No   Sexual activity: Yes  Other Topics Concern   Not on file  Social History Narrative   Lives locally.  Does not routinely exercise.   Social Determinants of Health   Financial Resource Strain: Low Risk  (02/16/2019)   Overall Financial Resource Strain (CARDIA)    Difficulty of Paying Living Expenses: Not hard at all  Food Insecurity: No Food Insecurity (01/02/2023)   Hunger Vital Sign    Worried About Running Out of Food in the Last Year: Never true    Ran Out of Food in the Last Year: Never true  Transportation Needs: No Transportation Needs (01/02/2023)   PRAPARE - Administrator, Civil Service (Medical): No    Lack of Transportation (Non-Medical): No  Physical Activity: Unknown (02/16/2019)   Exercise Vital Sign    Days of Exercise per Week: 7 days    Minutes of Exercise per Session: Not asked  Stress: No Stress Concern Present (02/16/2019)   [employer] of Occupational Health - Occupational Stress Questionnaire    Feeling of Stress : Only a little  Social Connections: Moderately Isolated (02/16/2019)   Social Connection and Isolation Panel [NHANES]    Frequency of Communication with Friends and Family: More than three times a week    Frequency of Social Gatherings with Friends and Family: More than three times a week    Attends Religious Services: More than 4 times per year    Active Member of [employer] or Organizations: No    Attends Banker Meetings:  Never    Marital Status: Widowed  Family History: The patient's family history includes Cancer in her sister; Depression in her mother; Diabetes in her brother, mother, and another family member; Heart attack in her mother and sister; Heart disease in her mother; Heart failure in her mother and sister; Hypertension in her brother, mother, sister, and another family member; SIDS in her sister; Stroke in her mother.  ROS:   Please see the history of present illness.     All other systems reviewed and are negative.  EKGs/Labs/Other Studies Reviewed:    The following studies were reviewed today:  Echo 07/2020  1. Left ventricular ejection fraction, by estimation, is 55 to 60%. Left  ventricular ejection fraction by 3D volume is 58 %. The left ventricle has  normal function. The left ventricle has no regional wall motion  abnormalities. There is mild left  ventricular hypertrophy. Left ventricular diastolic parameters are  consistent with Grade I diastolic dysfunction (impaired relaxation).   2. Right ventricular systolic function is normal. The right ventricular  size is normal. There is normal pulmonary artery systolic pressure.   3. Left atrial size was mildly dilated.   4. The mitral valve is grossly normal. Mild mitral valve regurgitation.   5. Tricuspid valve regurgitation is mild to moderate.   6. The aortic valve is tricuspid. Aortic valve regurgitation is not  visualized. Mild to moderate aortic valve sclerosis/calcification is  present, without any evidence of aortic stenosis.   7. The inferior vena cava is normal in size with greater than 50%  respiratory variability, suggesting right atrial pressure of 3 mmHg.   Heart monitor 07/2020 Event Monitor Patch Wear Time:  11 days and 3 hours    Normal sinus rhythm Patient had a min HR of 40 bpm, max HR of 143 bpm, and avg HR of 54 bpm.     1 run of Ventricular Tachycardia occurred lasting 4 beats with a max rate of 141 bpm  (avg 127 bpm).  3 Supraventricular Tachycardia  runs occurred, the run with the fastest interval lasting 4 beats with a max rate of 143 bpm, the longest lasting 4 beats with an avg rate of 103 bpm.   Isolated SVEs were rare (<1.0%), SVE Couplets were rare (<1.0%), and SVE Triplets were rare (<1.0%).  Isolated VEs were rare (<1.0%, 1969), VE Couplets were rare (<1.0%, 331), and VE Triplets were rare (<1.0%, 7). Ventricular Bigeminy and Trigeminy were present.    Patient triggered events associated with rare PAC   Signed, Dossie Arbour, MD, Ph.D [facility]  Myoview lexiscan 07/2020  Narrative & Impression  T wave inversion was noted during stress. The study is normal. This is a low risk study. The left ventricular ejection fraction is normal (55-65%). CT attenuation images showed significant aortic and coronary calcifications.     EKG:  EKG is ordered today.  The ekg ordered today demonstrates NSR 69bpm, LAD, LVH with repol  Recent Labs: 01/02/2023: Magnesium 2.1 01/03/2023: ALT 12; BUN 24; Creatinine, Ser 0.75; Platelets 198; Potassium 4.1; Sodium 142 01/04/2023: Hemoglobin 9.5  Recent Lipid Panel    Component Value Date/Time   CHOL 120 01/30/2017 0404   TRIG 73 01/30/2017 0404   HDL 47 01/30/2017 0404   CHOLHDL 2.6 01/30/2017 0404   VLDL 15 01/30/2017 0404   LDLCALC 58 01/30/2017 0404    Physical Exam:    VS:  BP 135/83 (BP Location: Right Arm, Patient Position: Sitting, Cuff Size: Large)   Pulse 69   Ht 5\' 11"  (1.803 m)  Wt 265 lb 12.8 oz (120.6 kg)   SpO2 98%   BMI 37.07 kg/m     Wt Readings from Last 3 Encounters:  03/20/23 265 lb 12.8 oz (120.6 kg)  02/03/23 273 lb (123.8 kg)  01/02/23 273 lb 5.9 oz (124 kg)     GEN:  Well nourished, well developed in no acute distress HEENT: Normal NECK: No JVD; No carotid bruits LYMPHATICS: No lymphadenopathy CARDIAC: RRR, no murmurs, rubs, gallops RESPIRATORY:  Clear to auscultation without rales, wheezing or rhonchi   ABDOMEN: Soft, non-tender, non-distended MUSCULOSKELETAL:  No edema; No deformity  SKIN: Warm and dry NEUROLOGIC:  Alert and oriented x 3 PSYCHIATRIC:  Normal affect   ASSESSMENT:    1. Chest pain, unspecified type   2. Coronary artery disease involving native coronary artery of native heart with angina pectoris (HCC)   3. Chronic diastolic heart failure (HCC)   4. Essential hypertension    PLAN:    In order of problems listed above:  Chest pain and shortness of breath Patient reports chest pain and shortness of breath for the last 3 to 4 months.  She reports chest pain is reproducible upon palpation.  She has chronic shortness of breath suspected from COPD.  Myoview Lexiscan in 2022 was low risk with no evidence of ischemia.  We discussed testing options today.  Will start with an echocardiogram.  HFpEF Patient is euvolemic on exam today.  I will refill her Coreg 3.125 mg twice daily.  It does not appear patient is on a diuretic.  Continue losartan and Coreg.  CAD status post remote CABG Patient reports mostly musculoskeletal chest pain.  We will start with an echocardiogram as above.  May consider ischemic to stress testing pending echo results and symptoms.  Hypertension Blood pressure today is reasonable.  Continue amlodipine 5 mg daily.  I will refill Coreg as above.  Continue losartan 100 mg daily.  Disposition: Follow up in 3 month(s) with MD/APP    Signed, Malone Admire David Stall, PA-C  03/22/2023 10:31 PM    [facility]

## 2023-03-20 NOTE — Patient Instructions (Signed)
Medication Instructions:  Your physician recommends that you continue on your current medications as directed. Please refer to the Current Medication list given to you today.  *If you need a refill on your cardiac medications before your next appointment, please call your pharmacy*  Lab Work: - None ordered  Testing/Procedures: Your physician has requested that you have an echocardiogram. Echocardiography is a painless test that uses sound waves to create images of your heart. It provides your doctor with information about the size and shape of your heart and how well your heart's chambers and valves are working. This procedure takes approximately one hour. There are no restrictions for this procedure. Please do NOT wear cologne, perfume, aftershave, or lotions (deodorant is allowed). Please arrive 15 minutes prior to your appointment time.  Please note: We ask at that you not bring children with you during ultrasound (echo/ vascular) testing. Due to room size and safety concerns, children are not allowed in the ultrasound rooms during exams. Our front office staff cannot provide observation of children in our lobby area while testing is being conducted. An adult accompanying a patient to their appointment will only be allowed in the ultrasound room at the discretion of the ultrasound technician under special circumstances. We apologize for any inconvenience.   Follow-Up: At [facility], you and your health needs are our priority.  As part of our continuing mission to provide you with exceptional heart care, we have created designated Provider Care Teams.  These Care Teams include your primary Cardiologist (physician) and Advanced Practice Providers (APPs -  Physician Assistants and Nurse Practitioners) who all work together to provide you with the care you need, when you need it.  Your next appointment:   3 month(s)  Provider:   Terrilee Croak, PA-C    Other Instructions - None

## 2023-04-02 ENCOUNTER — Other Ambulatory Visit: Payer: Self-pay | Admitting: Cardiovascular Disease

## 2023-04-06 ENCOUNTER — Encounter: Payer: Self-pay | Admitting: Anesthesiology

## 2023-04-06 ENCOUNTER — Other Ambulatory Visit: Payer: Self-pay | Admitting: Anesthesiology

## 2023-04-06 ENCOUNTER — Ambulatory Visit
Admission: RE | Admit: 2023-04-06 | Discharge: 2023-04-06 | Disposition: A | Discharge status: Home / Self Care | Payer: Medicare PPO | Source: Ambulatory Visit | Attending: Anesthesiology | Admitting: Anesthesiology

## 2023-04-06 ENCOUNTER — Ambulatory Visit: Payer: Medicare PPO | Admitting: Anesthesiology

## 2023-04-06 VITALS — BP 163/133 | HR 56 | Temp 97.2°F | Resp 18 | Ht 71.0 in | Wt 265.0 lb

## 2023-04-06 DIAGNOSIS — M5431 Sciatica, right side: Secondary | ICD-10-CM

## 2023-04-06 DIAGNOSIS — M4716 Other spondylosis with myelopathy, lumbar region: Secondary | ICD-10-CM | POA: Diagnosis not present

## 2023-04-06 DIAGNOSIS — M47817 Spondylosis without myelopathy or radiculopathy, lumbosacral region: Secondary | ICD-10-CM | POA: Diagnosis present

## 2023-04-06 DIAGNOSIS — F1190 Opioid use, unspecified, uncomplicated: Secondary | ICD-10-CM | POA: Diagnosis present

## 2023-04-06 DIAGNOSIS — M5432 Sciatica, left side: Secondary | ICD-10-CM | POA: Insufficient documentation

## 2023-04-06 DIAGNOSIS — M48062 Spinal stenosis, lumbar region with neurogenic claudication: Secondary | ICD-10-CM | POA: Insufficient documentation

## 2023-04-06 DIAGNOSIS — M5136 Other intervertebral disc degeneration, lumbar region with discogenic back pain only: Secondary | ICD-10-CM | POA: Diagnosis present

## 2023-04-06 DIAGNOSIS — G894 Chronic pain syndrome: Secondary | ICD-10-CM

## 2023-04-06 DIAGNOSIS — R52 Pain, unspecified: Secondary | ICD-10-CM

## 2023-04-06 DIAGNOSIS — G4486 Cervicogenic headache: Secondary | ICD-10-CM | POA: Diagnosis present

## 2023-04-06 MED ORDER — LIDOCAINE HCL (PF) 1 % IJ SOLN
5.0000 mL | Freq: Once | INTRAMUSCULAR | Status: AC
  Administered 2023-04-06: 5 mL via SUBCUTANEOUS
  Filled 2023-04-06: qty 5

## 2023-04-06 MED ORDER — SODIUM CHLORIDE 0.9% FLUSH: 10.0000 mL | Freq: Once | INTRAVENOUS | Status: DC

## 2023-04-06 MED ORDER — MIDAZOLAM HCL 2 MG/2ML IJ SOLN
2.0000 mg | Freq: Once | INTRAMUSCULAR | Status: AC
  Administered 2023-04-06: 2 mg via INTRAVENOUS
  Filled 2023-04-06: qty 2

## 2023-04-06 MED ORDER — TRIAMCINOLONE ACETONIDE 40 MG/ML IJ SUSP
40.0000 mg | Freq: Once | INTRAMUSCULAR | Status: AC
  Administered 2023-04-06: 40 mg
  Filled 2023-04-06: qty 1

## 2023-04-06 MED ORDER — SODIUM CHLORIDE 0.9% FLUSH
10.0000 mL | Freq: Two times a day (BID) | INTRAVENOUS | Status: DC
  Administered 2023-04-06: 10 mL via INTRAVENOUS

## 2023-04-06 MED ORDER — ROPIVACAINE HCL 2 MG/ML IJ SOLN
10.0000 mL | Freq: Once | INTRAMUSCULAR | Status: AC
  Administered 2023-04-06: 10 mL via EPIDURAL
  Filled 2023-04-06: qty 20

## 2023-04-06 MED ORDER — IOHEXOL 180 MG/ML  SOLN
10.0000 mL | Freq: Once | INTRAMUSCULAR | Status: AC | PRN
  Administered 2023-04-06: 10 mL via EPIDURAL
  Filled 2023-04-06: qty 20

## 2023-04-06 NOTE — Patient Instructions (Signed)

## 2023-04-06 NOTE — Progress Notes (Unsigned)
Nursing Pain Medication Assessment:  Safety precautions to be maintained throughout the outpatient stay will include: orient to surroundings, keep bed in low position, maintain call bell within reach at all times, provide assistance with transfer out of bed and ambulation.  Medication Inspection Compliance: Ms. Dalberg did not comply with our request to bring her pills to be counted. She was reminded that bringing the medication bottles, even when empty, is a requirement.  Medication: None brought in. Pill/Patch Count: None available to be counted. Bottle Appearance: No container available. Did not bring bottle(s) to appointment. Filled Date: N/A Last Medication intake:  TodaySafety precautions to be maintained throughout the outpatient stay will include: orient to surroundings, keep bed in low position, maintain call bell within reach at all times, provide assistance with transfer out of bed and ambulation.

## 2023-04-06 NOTE — Progress Notes (Unsigned)
Subjective:  Patient ID: Tina Hayes, female    DOB: [birth year]  Age: 77 y.o. MRN: [id number]  CC: Back Pain (lower)   Procedure: Caudal epidural steroid under fluoroscopic guidance with minimal sedation  HPI Tina Hayes presents for reevaluation.  Yurika has had recurrence of her low back pain bilateral hip and buttock pain and posterior leg pain.  In the past she has had caudal epidural steroids periodically with her most recent injection in the summer.  She generally gets about 75 to 80% relief lasting about 3 to 4 months before she has recurrence of her baseline pain.  She reports having had recurrence of this at this time.  No change in lower extremity strength function bowel or bladder function is noted.  She takes her medications but despite these this pain has been more intense.  When she has a caudal epidural she reports that the medications are more effective and her relief is more significant with the medication management as well.  Following the caudal epidural she is able to be more active and sleep better.  Outpatient Medications Prior to Visit  Medication Sig Dispense Refill   acetaminophen (TYLENOL) 500 MG tablet Take 500 mg by mouth every 6 (six) hours as needed for headache, moderate pain or fever.     albuterol (PROVENTIL HFA;VENTOLIN HFA) 108 (90 Base) MCG/ACT inhaler Inhale 2 puffs into the lungs every 6 (six) hours as needed for wheezing or shortness of breath. 1 Inhaler 2   albuterol (PROVENTIL) (2.5 MG/3ML) 0.083% nebulizer solution Take 2.5 mg every 4 (four) hours as needed by nebulization for wheezing or shortness of breath.     amitriptyline (ELAVIL) 25 MG tablet Take 25 mg by mouth at bedtime.     amLODipine (NORVASC) 5 MG tablet Take 1 tablet by mouth once daily 90 tablet 1   atorvastatin (LIPITOR) 40 MG tablet Take 1 tablet (40 mg total) by mouth daily. 90 tablet 3   B-D UF III MINI PEN NEEDLES 31G X 5 MM MISC USE WITH INSULIN PEN INJECTIONS TWICE DAILY      carvedilol (COREG) 3.125 MG tablet Take 1 tablet (3.125 mg total) by mouth 2 (two) times daily with a meal. 90 tablet 1   clopidogrel (PLAVIX) 75 MG tablet Take 75 mg by mouth daily.     divalproex (DEPAKOTE) 250 MG DR tablet Take 250 mg by mouth 2 (two) times daily.     DUPIXENT 300 MG/2ML SOPN Inject into the skin.     Ferrous Sulfate (IRON) 325 (65 Fe) MG TABS Take 1 tablet by mouth daily.     HYDROcodone-acetaminophen (NORCO) 7.5-325 MG tablet Take 1 tablet by mouth every 6 (six) hours as needed for moderate pain or severe pain. 120 tablet 0   insulin aspart (NOVOLOG) 100 UNIT/ML FlexPen Inject 8 units subQ if blood sugar is consistently over 300 for more than 6 hours despite taking your other prescribed insulin. Repeat up to three times a day (every 8 hours) until blood sugars improve. 15 mL 0   LANTUS SOLOSTAR 100 UNIT/ML Solostar Pen Inject 10 Units into the skin See admin instructions. Inject 30 units subcutaneously in the morning at 35 units subcutaneously at bedtime. 15 mL 11   levETIRAcetam (KEPPRA) 750 MG tablet Take 1,500 mg by mouth 2 (two) times daily.     losartan (COZAAR) 100 MG tablet Take 1 tablet by mouth once daily 90 tablet 3   montelukast (SINGULAIR) 10 MG tablet Take 10 mg  by mouth daily.     naloxone (NARCAN) nasal spray 4 mg/0.1 mL As directed for opioid induced respiratory depression 1 each 1   nitroGLYCERIN (NITROSTAT) 0.4 MG SL tablet DISSOLVE ONE TABLET UNDER THE TONGUE EVERY 5 MINUTES AS NEEDED FOR CHEST PAIN.  DO NOT EXCEED A TOTAL OF 3 DOSES IN 15 MINUTES 25 tablet 0   omeprazole (PRILOSEC) 40 MG capsule Take 1 capsule (40 mg total) by mouth 2 (two) times daily before a meal. 180 capsule 3   sertraline (ZOLOFT) 100 MG tablet Take 150 mg by mouth daily.     sucralfate (CARAFATE) 1 GM/10ML suspension Take 10 mLs (1 g total) by mouth 4 (four) times daily -  with meals and at bedtime. 420 mL 0   tiotropium (SPIRIVA) 18 MCG inhalation capsule Place 18 mcg into inhaler and  inhale daily.     traZODone (DESYREL) 50 MG tablet Take 50 mg by mouth at bedtime.     No facility-administered medications prior to visit.    Review of Systems CNS: No confusion or sedation Cardiac: No angina or palpitations GI: No abdominal pain or constipation Constitutional: No nausea vomiting fevers or chills  Objective:  BP (!) 163/133   Pulse (!) 56   Temp (!) 97.2 F (36.2 C) (Temporal)   Resp 18   Ht 5\' 11"  (1.803 m)   Wt 265 lb (120.2 kg)   SpO2 100%   BMI 36.96 kg/m    BP Readings from Last 3 Encounters:  04/06/23 (!) 163/133  03/20/23 135/83  02/03/23 (!) 163/75     Wt Readings from Last 3 Encounters:  04/06/23 265 lb (120.2 kg)  03/20/23 265 lb 12.8 oz (120.6 kg)  02/03/23 273 lb (123.8 kg)     Physical Exam Pt is alert and oriented PERRL EOMI HEART IS RRR no murmur or rub LCTA no wheezing or rales MUSCULOSKELETAL reveals some paraspinous muscle tenderness in the lumbar region.  No trigger points are identifiable.  Her muscle tone and bulk to the lower extremities is at baseline.  She is in her stroller for ambulatory assistance.  She continues to battle with weight gain chronically which has limited her her ambulatory capacity.  Labs  Lab Results  Component Value Date   HGBA1C 8.7 (H) 01/02/2023   HGBA1C 7.7 (H) 02/24/2021   HGBA1C 6.5 (H) 01/18/2019   Lab Results  Component Value Date   LDLCALC 58 01/30/2017   CREATININE 0.75 01/03/2023    -------------------------------------------------------------------------------------------------------------------- Lab Results  Component Value Date   WBC 4.2 01/03/2023   HGB 9.5 (L) 01/04/2023   HCT 25.9 (L) 01/03/2023   PLT 198 01/03/2023   GLUCOSE 109 (H) 01/03/2023   CHOL 120 01/30/2017   TRIG 73 01/30/2017   HDL 47 01/30/2017   LDLCALC 58 01/30/2017   ALT 12 01/03/2023   AST 13 (L) 01/03/2023   NA 142 01/03/2023   K 4.1 01/03/2023   CL 108 01/03/2023   CREATININE 0.75 01/03/2023    BUN 24 (H) 01/03/2023   CO2 28 01/03/2023   TSH 2.640 07/13/2020   INR 1.0 01/02/2023   HGBA1C 8.7 (H) 01/02/2023    --------------------------------------------------------------------------------------------------------------------- DG PAIN CLINIC C-ARM 1-60 MIN NO REPORT  Result Date: 04/06/2023 Fluoro was used, but no Radiologist interpretation will be provided. Please refer to "NOTES" tab for provider progress note.    Assessment & Plan:   Earlyn was seen today for back pain.  Diagnoses and all orders for this visit:  Facet  arthritis of lumbosacral region  Degeneration of intervertebral disc of lumbar region with discogenic back pain -     Lumbar Epidural Injection  Bilateral sciatica -     Lumbar Epidural Injection -     triamcinolone acetonide (KENALOG-40) injection 40 mg -     sodium chloride flush (NS) 0.9 % injection 10 mL -     ropivacaine (PF) 2 mg/mL (0.2%) (NAROPIN) injection 10 mL -     midazolam (VERSED) injection 2 mg -     lidocaine (PF) (XYLOCAINE) 1 % injection 5 mL -     iohexol (OMNIPAQUE) 180 MG/ML injection 10 mL -     sodium chloride flush (NS) 0.9 % injection 10 mL  Spinal stenosis of lumbar region with neurogenic claudication -     Lumbar Epidural Injection -     triamcinolone acetonide (KENALOG-40) injection 40 mg -     sodium chloride flush (NS) 0.9 % injection 10 mL -     ropivacaine (PF) 2 mg/mL (0.2%) (NAROPIN) injection 10 mL -     midazolam (VERSED) injection 2 mg -     lidocaine (PF) (XYLOCAINE) 1 % injection 5 mL -     iohexol (OMNIPAQUE) 180 MG/ML injection 10 mL -     sodium chloride flush (NS) 0.9 % injection 10 mL  Lumbar spondylosis with myelopathy  Chronic, continuous use of opioids  Morbid obesity (HCC)  Chronic pain syndrome  Cervicogenic headache        ----------------------------------------------------------------------------------------------------------------------  Problem List Items Addressed This Visit        Unprioritized   Headache   Morbid obesity (HCC)   Other Visit Diagnoses     Facet arthritis of lumbosacral region    -  Primary   Relevant Medications   triamcinolone acetonide (KENALOG-40) injection 40 mg (Completed)   Degeneration of intervertebral disc of lumbar region with discogenic back pain       Relevant Medications   triamcinolone acetonide (KENALOG-40) injection 40 mg (Completed)   Bilateral sciatica       Relevant Medications   triamcinolone acetonide (KENALOG-40) injection 40 mg (Completed)   sodium chloride flush (NS) 0.9 % injection 10 mL   ropivacaine (PF) 2 mg/mL (0.2%) (NAROPIN) injection 10 mL (Completed)   midazolam (VERSED) injection 2 mg (Completed)   lidocaine (PF) (XYLOCAINE) 1 % injection 5 mL (Completed)   iohexol (OMNIPAQUE) 180 MG/ML injection 10 mL (Completed)   sodium chloride flush (NS) 0.9 % injection 10 mL   Spinal stenosis of lumbar region with neurogenic claudication       Relevant Medications   triamcinolone acetonide (KENALOG-40) injection 40 mg (Completed)   sodium chloride flush (NS) 0.9 % injection 10 mL   ropivacaine (PF) 2 mg/mL (0.2%) (NAROPIN) injection 10 mL (Completed)   midazolam (VERSED) injection 2 mg (Completed)   lidocaine (PF) (XYLOCAINE) 1 % injection 5 mL (Completed)   iohexol (OMNIPAQUE) 180 MG/ML injection 10 mL (Completed)   sodium chloride flush (NS) 0.9 % injection 10 mL   Lumbar spondylosis with myelopathy       Relevant Medications   triamcinolone acetonide (KENALOG-40) injection 40 mg (Completed)   Chronic, continuous use of opioids       Chronic pain syndrome       Relevant Medications   triamcinolone acetonide (KENALOG-40) injection 40 mg (Completed)   ropivacaine (PF) 2 mg/mL (0.2%) (NAROPIN) injection 10 mL (Completed)   lidocaine (PF) (XYLOCAINE) 1 % injection 5 mL (Completed)          ----------------------------------------------------------------------------------------------------------------------  1. Degeneration of intervertebral disc of lumbar region with discogenic back pain Will proceed with her epidural today.  We gone over the risks and benefits of the procedure with her in full detail all questions answered.  Fortunately she has responded favorably to these in the past.  Will continue her on her existing medication regimen with refills given today for December 10 and January 9.  Continue efforts at core stretching strengthening and weight loss as reviewed once again today.  Will schedule her for a 94-month return to clinic. - Lumbar Epidural Injection  2. Bilateral sciatica As above - Lumbar Epidural Injection - triamcinolone acetonide (KENALOG-40) injection 40 mg - sodium chloride flush (NS) 0.9 % injection 10 mL - ropivacaine (PF) 2 mg/mL (0.2%) (NAROPIN) injection 10 mL - midazolam (VERSED) injection 2 mg - lidocaine (PF) (XYLOCAINE) 1 % injection 5 mL - iohexol (OMNIPAQUE) 180 MG/ML injection 10 mL - sodium chloride flush (NS) 0.9 % injection 10 mL  3. Spinal stenosis of lumbar region with neurogenic claudication As above - Lumbar Epidural Injection - triamcinolone acetonide (KENALOG-40) injection 40 mg - sodium chloride flush (NS) 0.9 % injection 10 mL - ropivacaine (PF) 2 mg/mL (0.2%) (NAROPIN) injection 10 mL - midazolam (VERSED) injection 2 mg - lidocaine (PF) (XYLOCAINE) 1 % injection 5 mL - iohexol (OMNIPAQUE) 180 MG/ML injection 10 mL - sodium chloride flush (NS) 0.9 % injection 10 mL  4. Facet arthritis of lumbosacral region Continue core stretching strengthening  5. Lumbar spondylosis with myelopathy   6. Chronic, continuous use of opioids I have reviewed the [facility] practitioner database information is appropriate for refill.  Unfortunately she has required chronic opioid therapy to assist with pain management but  maintains that she is without side effect and the medications continue to enable her to stay active functional to the best of her ability where she was entirely incapacitated by low back pain prior to opioid therapy.  7. Morbid obesity (HCC) Continue efforts at weight loss  8. Chronic pain syndrome   9. Cervicogenic headache As above and continue follow-up with her primary care physicians for baseline medical care with return to clinic in 2 months    ----------------------------------------------------------------------------------------------------------------------  I am having Oleh Genin. Luck maintain her tiotropium, traZODone, albuterol, albuterol, levETIRAcetam, B-D UF III MINI PEN NEEDLES, acetaminophen, Dupixent, sertraline, amitriptyline, montelukast, atorvastatin, nitroGLYCERIN, omeprazole, divalproex, amLODipine, insulin aspart, Iron, Lantus SoloStar, sucralfate, HYDROcodone-acetaminophen, naloxone, carvedilol, losartan, and clopidogrel. We administered triamcinolone acetonide, ropivacaine (PF) 2 mg/mL (0.2%), midazolam, lidocaine (PF), iohexol, and sodium chloride flush.   Meds ordered this encounter  Medications   triamcinolone acetonide (KENALOG-40) injection 40 mg   sodium chloride flush (NS) 0.9 % injection 10 mL   ropivacaine (PF) 2 mg/mL (0.2%) (NAROPIN) injection 10 mL   midazolam (VERSED) injection 2 mg   lidocaine (PF) (XYLOCAINE) 1 % injection 5 mL   iohexol (OMNIPAQUE) 180 MG/ML injection 10 mL   sodium chloride flush (NS) 0.9 % injection 10 mL   Patient's Medications  New Prescriptions   No medications on file  Previous Medications   ACETAMINOPHEN (TYLENOL) 500 MG TABLET    Take 500 mg by mouth every 6 (six) hours as needed for headache, moderate pain or fever.   ALBUTEROL (PROVENTIL HFA;VENTOLIN HFA) 108 (90 BASE) MCG/ACT INHALER    Inhale 2 puffs into the lungs every 6 (six) hours as needed for wheezing or shortness of breath.   ALBUTEROL (PROVENTIL) (2.5  MG/3ML) 0.083% NEBULIZER SOLUTION    Take  2.5 mg every 4 (four) hours as needed by nebulization for wheezing or shortness of breath.   AMITRIPTYLINE (ELAVIL) 25 MG TABLET    Take 25 mg by mouth at bedtime.   AMLODIPINE (NORVASC) 5 MG TABLET    Take 1 tablet by mouth once daily   ATORVASTATIN (LIPITOR) 40 MG TABLET    Take 1 tablet (40 mg total) by mouth daily.   B-D UF III MINI PEN NEEDLES 31G X 5 MM MISC    USE WITH INSULIN PEN INJECTIONS TWICE DAILY   CARVEDILOL (COREG) 3.125 MG TABLET    Take 1 tablet (3.125 mg total) by mouth 2 (two) times daily with a meal.   CLOPIDOGREL (PLAVIX) 75 MG TABLET    Take 75 mg by mouth daily.   DIVALPROEX (DEPAKOTE) 250 MG DR TABLET    Take 250 mg by mouth 2 (two) times daily.   DUPIXENT 300 MG/2ML SOPN    Inject into the skin.   FERROUS SULFATE (IRON) 325 (65 FE) MG TABS    Take 1 tablet by mouth daily.   HYDROCODONE-ACETAMINOPHEN (NORCO) 7.5-325 MG TABLET    Take 1 tablet by mouth every 6 (six) hours as needed for moderate pain or severe pain.   INSULIN ASPART (NOVOLOG) 100 UNIT/ML FLEXPEN    Inject 8 units subQ if blood sugar is consistently over 300 for more than 6 hours despite taking your other prescribed insulin. Repeat up to three times a day (every 8 hours) until blood sugars improve.   LANTUS SOLOSTAR 100 UNIT/ML SOLOSTAR PEN    Inject 10 Units into the skin See admin instructions. Inject 30 units subcutaneously in the morning at 35 units subcutaneously at bedtime.   LEVETIRACETAM (KEPPRA) 750 MG TABLET    Take 1,500 mg by mouth 2 (two) times daily.   LOSARTAN (COZAAR) 100 MG TABLET    Take 1 tablet by mouth once daily   MONTELUKAST (SINGULAIR) 10 MG TABLET    Take 10 mg by mouth daily.   NALOXONE (NARCAN) NASAL SPRAY 4 MG/0.1 ML    As directed for opioid induced respiratory depression   NITROGLYCERIN (NITROSTAT) 0.4 MG SL TABLET    DISSOLVE ONE TABLET UNDER THE TONGUE EVERY 5 MINUTES AS NEEDED FOR CHEST PAIN.  DO NOT EXCEED A TOTAL OF 3 DOSES IN 15  MINUTES   OMEPRAZOLE (PRILOSEC) 40 MG CAPSULE    Take 1 capsule (40 mg total) by mouth 2 (two) times daily before a meal.   SERTRALINE (ZOLOFT) 100 MG TABLET    Take 150 mg by mouth daily.   SUCRALFATE (CARAFATE) 1 GM/10ML SUSPENSION    Take 10 mLs (1 g total) by mouth 4 (four) times daily -  with meals and at bedtime.   TIOTROPIUM (SPIRIVA) 18 MCG INHALATION CAPSULE    Place 18 mcg into inhaler and inhale daily.   TRAZODONE (DESYREL) 50 MG TABLET    Take 50 mg by mouth at bedtime.  Modified Medications   No medications on file  Discontinued Medications   No medications on file   ----------------------------------------------------------------------------------------------------------------------  Follow-up: Return in about 1 month (around 05/07/2023) for evaluation, med refill.   Procedure: Caudal SI with fluoroscopic guidance and with minimal sedation 2 mg of IV Versed was given for minimal sedation and the patient responded well with relaxation vital signs stable throughout Prior to this, informed consent was obtained and the risks benefits reviewed patient chose to pursue a caudal epidural steroid injection today. With the patient in  the prone position and an IV in place, sedation was with IV versed. Using AP and lateral fluoroscopic guidance I identified the area overlying the sacral hiatus. This area was broadly prepped with DuraPrep 3 and we utilized strict aseptic technique during the procedure. 1% lidocaine was infiltrated 2 cc using a 25-gauge needle overlying the sacral cornu and then using lateral fluoroscopic guidance I advanced an 18-gauge Touhy needle approximately 2 cm through the sacral hiatus. Confirmation was with 2 cc of contrast dye yielding good epidural spread and no evidence of IV or subarachnoid uptake. This was followed by an injection of 5 cc of saline mixed with 1 cc of ropivacaine 0.2% and 40 mg of triamcinolone. This was tolerated without difficulty the patient was  convalesced and discharged home in stable condition for follow-up as mentioned. JA

## 2023-04-07 ENCOUNTER — Telehealth: Payer: Self-pay | Admitting: Anesthesiology

## 2023-04-07 ENCOUNTER — Telehealth: Payer: Self-pay | Admitting: *Deleted

## 2023-04-07 NOTE — Telephone Encounter (Signed)
Patient informed. 

## 2023-04-07 NOTE — Telephone Encounter (Signed)
Attempted to call for post procedure follow-up. Message left. 

## 2023-04-07 NOTE — Telephone Encounter (Signed)
Victorino Dike has already spoken with Dr. Pernell Dupre, app should be working tonight, he will send at that time.

## 2023-04-07 NOTE — Telephone Encounter (Signed)
PT called stated that when she went to pick up meds that were supposed to been send in on yesterday. PT stated that she is in pain. Wants to know what's going on with her medication. Please give patient a call. TY

## 2023-04-07 NOTE — Telephone Encounter (Signed)
Dr Pernell Dupre did not refill patients pain medications yesterday when she was here. Please contact him and let patient know when she can pick up meds.

## 2023-04-08 MED ORDER — HYDROCODONE-ACETAMINOPHEN 7.5-325 MG PO TABS: 1.0000 | ORAL_TABLET | Freq: Four times a day (QID) | ORAL | 0 refills | Status: DC | PRN

## 2023-04-08 MED ORDER — HYDROCODONE-ACETAMINOPHEN 7.5-325 MG PO TABS
1.0000 | ORAL_TABLET | Freq: Four times a day (QID) | ORAL | 0 refills | Status: AC | PRN
Start: 2023-05-07 — End: 2023-06-06

## 2023-04-13 ENCOUNTER — Ambulatory Visit: Payer: Medicare PPO | Attending: Medical

## 2023-04-13 DIAGNOSIS — I25119 Atherosclerotic heart disease of native coronary artery with unspecified angina pectoris: Secondary | ICD-10-CM | POA: Diagnosis not present

## 2023-04-13 LAB — ECHOCARDIOGRAM COMPLETE
Area-P 1/2: 2.32 cm2
S' Lateral: 3.9 cm

## 2023-04-26 ENCOUNTER — Other Ambulatory Visit: Payer: Self-pay | Admitting: Gastroenterology

## 2023-04-26 DIAGNOSIS — K449 Diaphragmatic hernia without obstruction or gangrene: Secondary | ICD-10-CM

## 2023-05-04 ENCOUNTER — Ambulatory Visit: Payer: Medicare PPO | Admitting: Gastroenterology

## 2023-05-05 ENCOUNTER — Encounter: Payer: Self-pay | Admitting: Anesthesiology

## 2023-05-05 ENCOUNTER — Ambulatory Visit: Payer: Medicare PPO | Attending: Anesthesiology | Admitting: Anesthesiology

## 2023-05-05 DIAGNOSIS — M4716 Other spondylosis with myelopathy, lumbar region: Secondary | ICD-10-CM

## 2023-05-05 DIAGNOSIS — M5432 Sciatica, left side: Secondary | ICD-10-CM | POA: Diagnosis not present

## 2023-05-05 DIAGNOSIS — Z79891 Long term (current) use of opiate analgesic: Secondary | ICD-10-CM

## 2023-05-05 DIAGNOSIS — M5136 Other intervertebral disc degeneration, lumbar region with discogenic back pain only: Secondary | ICD-10-CM

## 2023-05-05 DIAGNOSIS — M5431 Sciatica, right side: Secondary | ICD-10-CM

## 2023-05-05 DIAGNOSIS — G894 Chronic pain syndrome: Secondary | ICD-10-CM

## 2023-05-05 DIAGNOSIS — G4486 Cervicogenic headache: Secondary | ICD-10-CM

## 2023-05-05 DIAGNOSIS — M25512 Pain in left shoulder: Secondary | ICD-10-CM

## 2023-05-05 DIAGNOSIS — M48062 Spinal stenosis, lumbar region with neurogenic claudication: Secondary | ICD-10-CM | POA: Diagnosis not present

## 2023-05-05 DIAGNOSIS — M47817 Spondylosis without myelopathy or radiculopathy, lumbosacral region: Secondary | ICD-10-CM

## 2023-05-05 DIAGNOSIS — F1190 Opioid use, unspecified, uncomplicated: Secondary | ICD-10-CM

## 2023-05-05 DIAGNOSIS — M170 Bilateral primary osteoarthritis of knee: Secondary | ICD-10-CM

## 2023-05-05 MED ORDER — HYDROCODONE-ACETAMINOPHEN 7.5-325 MG PO TABS: 1.0000 | ORAL_TABLET | Freq: Four times a day (QID) | ORAL | 0 refills | Status: DC | PRN

## 2023-05-05 NOTE — Progress Notes (Signed)
 Virtual Visit via Telephone Note  I connected with Tina Hayes on 05/05/23 at  3:40 PM EST by telephone and verified that I am speaking with the correct person using two identifiers.  Location: Patient: Home Provider: Pain control center   I discussed the limitations, risks, security and privacy concerns of performing an evaluation and management service by telephone and the availability of in person appointments. I also discussed with the patient that there may be a patient responsible charge related to this service. The patient expressed understanding and agreed to proceed.   History of Present Illness: I spoke to Tina Hayes via telephone.  She could not do the video portion of the conference but she reports that she did quite well following her most recent caudal epidural steroid injection.  She has had complete relief of her lower extremity sciatica pain and about a 70% reduction in her lower back pain.  Her hip pain has also improved.  She still has intermittent knee pain and lower leg pain comparable to what she experienced prior to the injection but overall she feels like she is much more comfortable and her medications are working more effectively.  Unfortunately, she does have a flu like illness at present and is scheduled to see her primary care physician if the symptoms are not better within the next 24 to 48 hours.  She is taking her pain medications as prescribed and these continue to work effectively for her without side effect.  They keep the pain that is residual under control.  Otherwise no change in lower extremity strength function bowel or bladder bladder function.  She is experiencing no shortness of breath though a mildly productive cough in her heart function has been stable, this is a chronic problem for her.  Review of systems: General: No fevers or chills Pulmonary: No shortness of breath or dyspnea Cardiac: No angina or palpitations or lightheadedness GI: No abdominal  pain or constipation Psych: No depression  Observations/Objective:  Current Outpatient Medications:    acetaminophen  (TYLENOL ) 500 MG tablet, Take 500 mg by mouth every 6 (six) hours as needed for headache, moderate pain or fever., Disp: , Rfl:    albuterol  (PROVENTIL  HFA;VENTOLIN  HFA) 108 (90 Base) MCG/ACT inhaler, Inhale 2 puffs into the lungs every 6 (six) hours as needed for wheezing or shortness of breath., Disp: 1 Inhaler, Rfl: 2   albuterol  (PROVENTIL ) (2.5 MG/3ML) 0.083% nebulizer solution, Take 2.5 mg every 4 (four) hours as needed by nebulization for wheezing or shortness of breath., Disp: , Rfl:    amitriptyline (ELAVIL) 25 MG tablet, Take 25 mg by mouth at bedtime., Disp: , Rfl:    amLODipine  (NORVASC ) 5 MG tablet, Take 1 tablet by mouth once daily, Disp: 90 tablet, Rfl: 1   atorvastatin  (LIPITOR) 40 MG tablet, Take 1 tablet (40 mg total) by mouth daily., Disp: 90 tablet, Rfl: 3   B-D UF III MINI PEN NEEDLES 31G X 5 MM MISC, USE WITH INSULIN  PEN INJECTIONS TWICE DAILY, Disp: , Rfl:    carvedilol  (COREG ) 3.125 MG tablet, Take 1 tablet (3.125 mg total) by mouth 2 (two) times daily with a meal., Disp: 90 tablet, Rfl: 1   clopidogrel  (PLAVIX ) 75 MG tablet, Take 75 mg by mouth daily., Disp: , Rfl:    divalproex  (DEPAKOTE ) 250 MG DR tablet, Take 250 mg by mouth 2 (two) times daily., Disp: , Rfl:    DUPIXENT 300 MG/2ML SOPN, Inject into the skin., Disp: , Rfl:  Ferrous Sulfate  (IRON ) 325 (65 Fe) MG TABS, Take 1 tablet by mouth daily., Disp: , Rfl:    [START ON 05/07/2023] HYDROcodone -acetaminophen  (NORCO) 7.5-325 MG tablet, Take 1 tablet by mouth every 6 (six) hours as needed for moderate pain (pain score 4-6) or severe pain (pain score 7-10)., Disp: 120 tablet, Rfl: 0   [START ON 06/06/2023] HYDROcodone -acetaminophen  (NORCO) 7.5-325 MG tablet, Take 1 tablet by mouth every 6 (six) hours as needed for moderate pain (pain score 4-6) or severe pain (pain score 7-10)., Disp: 120 tablet, Rfl: 0    insulin  aspart (NOVOLOG ) 100 UNIT/ML FlexPen, Inject 8 units subQ if blood sugar is consistently over 300 for more than 6 hours despite taking your other prescribed insulin . Repeat up to three times a day (every 8 hours) until blood sugars improve., Disp: 15 mL, Rfl: 0   LANTUS  SOLOSTAR 100 UNIT/ML Solostar Pen, Inject 10 Units into the skin See admin instructions. Inject 30 units subcutaneously in the morning at 35 units subcutaneously at bedtime., Disp: 15 mL, Rfl: 11   levETIRAcetam  (KEPPRA ) 750 MG tablet, Take 1,500 mg by mouth 2 (two) times daily., Disp: , Rfl:    losartan  (COZAAR ) 100 MG tablet, Take 1 tablet by mouth once daily, Disp: 90 tablet, Rfl: 3   montelukast  (SINGULAIR ) 10 MG tablet, Take 10 mg by mouth daily., Disp: , Rfl:    naloxone  (NARCAN ) nasal spray 4 mg/0.1 mL, As directed for opioid induced respiratory depression, Disp: 1 each, Rfl: 1   nitroGLYCERIN  (NITROSTAT ) 0.4 MG SL tablet, DISSOLVE ONE TABLET UNDER THE TONGUE EVERY 5 MINUTES AS NEEDED FOR CHEST PAIN.  DO NOT EXCEED A TOTAL OF 3 DOSES IN 15 MINUTES, Disp: 25 tablet, Rfl: 0   omeprazole  (PRILOSEC) 40 MG capsule, TAKE 1 CAPSULE BY MOUTH TWICE DAILY BEFORE A MEAL, Disp: 180 capsule, Rfl: 0   sertraline  (ZOLOFT ) 100 MG tablet, Take 150 mg by mouth daily., Disp: , Rfl:    sucralfate  (CARAFATE ) 1 GM/10ML suspension, Take 10 mLs (1 g total) by mouth 4 (four) times daily -  with meals and at bedtime., Disp: 420 mL, Rfl: 0   tiotropium (SPIRIVA ) 18 MCG inhalation capsule, Place 18 mcg into inhaler and inhale daily., Disp: , Rfl:    traZODone  (DESYREL ) 50 MG tablet, Take 50 mg by mouth at bedtime., Disp: , Rfl:    Past Medical History:  Diagnosis Date   (HFpEF) heart failure with preserved ejection fraction (HCC)    a. 05/2016 Echo: EF 60-65%, mild to mod LVH, Gr1 DD, mild MR, mildly dil LA, mod TR, mildly to mod increased PASP; b. 07/2020 Echo: EF 55-60%, no rwma, mild LVH, GrIDD, mildly dil LA, mild MR, mild-mod TR, mild-mod AoV  sclerosis w/o stenosis.   Anxiety    Arthritis    Chronic back pain    COPD (chronic obstructive pulmonary disease) (HCC)    Coronary artery disease    a. s/p remote PCI x 5;  b. 2006 s/p CABG x 3 ([place] [place] [facility] b. 05/2016 MV: attenuation corrected images w/o ischemia or wma-->Med rx; c. 06/2020 MV: EF 55-65%, no isch/infart-->low risk.   Depression    Diabetes mellitus without complication (HCC)    Essential hypertension 06/30/2016   GERD (gastroesophageal reflux disease)    Heart attack (HCC)    Total of 3 per pt.   Hyperlipidemia LDL goal <55    Hypertensive urgency 06/03/2015   PAD (peripheral artery disease) (HCC)    a. 06/2019  Angio: R AT (PTA), R POP (PTA & Viabahn stenting); b. 09/2020 ABIs: R 1.24, L 1.35.   Palpitations    a. 06/2020 Zio: Sinus brady, 54 (40-143), 4 beats NSVT, 3 SVT runs (max 143 bpm x 4 beats).   Seizure (HCC)    Assessment and Plan: 1. Degeneration of intervertebral disc of lumbar region with discogenic back pain   2. Bilateral sciatica   3. Spinal stenosis of lumbar region with neurogenic claudication   4. Facet arthritis of lumbosacral region   5. Lumbar spondylosis with myelopathy   6. Chronic, continuous use of opioids   7. Morbid obesity (HCC)   8. Chronic pain syndrome   9. Cervicogenic headache   10. Primary osteoarthritis of both knees   11. Pain in joint of left shoulder    Based on our conversation I think it is appropriate to refill her medicines for the next month.  She currently has an outstanding January 9 prescription for her hydrocodone .  I will renew her February 8 prescription.  She has a negative urine screen from back in March so will be due for a repeat soon.  I discussed this with her.  Additionally, I want her to follow-up with her primary care physicians should her symptom complex worsen with her cardiopulmonary history and ongoing flu symptoms.  Will schedule her for 30-month return to clinic.  No other  changes in her pharmacologic regimen will be initiated.  Follow Up Instructions:    I discussed the assessment and treatment plan with the patient. The patient was provided an opportunity to ask questions and all were answered. The patient agreed with the plan and demonstrated an understanding of the instructions.   The patient was advised to call back or seek an in-person evaluation if the symptoms worsen or if the condition fails to improve as anticipated.  I provided 30 minutes of non-face-to-face time during this encounter.   Lynwood KANDICE Clause, MD

## 2023-05-07 ENCOUNTER — Telehealth: Payer: Self-pay

## 2023-05-07 ENCOUNTER — Ambulatory Visit: Payer: Medicare PPO | Admitting: Gastroenterology

## 2023-05-07 ENCOUNTER — Other Ambulatory Visit: Payer: Self-pay

## 2023-05-07 ENCOUNTER — Encounter: Payer: Self-pay | Admitting: Gastroenterology

## 2023-05-07 NOTE — Telephone Encounter (Signed)
 Patient today was patient third no show. She no showed on 08/27/2022, 03/02/2023 and today 05/07/2023. Do you want to dismiss this patient for no showing appointments?

## 2023-05-08 ENCOUNTER — Other Ambulatory Visit: Payer: Self-pay

## 2023-05-08 NOTE — Telephone Encounter (Signed)
 Dismissal letter has been sent to patient as well as PCP.

## 2023-06-23 ENCOUNTER — Encounter: Payer: Self-pay | Admitting: Physician Assistant

## 2023-06-23 ENCOUNTER — Other Ambulatory Visit: Payer: Self-pay | Admitting: Physician Assistant

## 2023-06-23 DIAGNOSIS — R410 Disorientation, unspecified: Secondary | ICD-10-CM

## 2023-06-23 DIAGNOSIS — S36200D Unspecified injury of head of pancreas, subsequent encounter: Secondary | ICD-10-CM

## 2023-06-23 DIAGNOSIS — R519 Headache, unspecified: Secondary | ICD-10-CM

## 2023-06-26 ENCOUNTER — Ambulatory Visit
Admission: RE | Admit: 2023-06-26 | Discharge: 2023-06-26 | Disposition: A | Discharge status: Home / Self Care | Payer: Medicare HMO | Source: Ambulatory Visit | Attending: Physician Assistant | Admitting: Physician Assistant

## 2023-06-26 ENCOUNTER — Inpatient Hospital Stay: Admission: RE | Admit: 2023-06-26 | Payer: Medicare HMO | Source: Ambulatory Visit

## 2023-06-26 DIAGNOSIS — R519 Headache, unspecified: Secondary | ICD-10-CM

## 2023-06-26 DIAGNOSIS — S36200D Unspecified injury of head of pancreas, subsequent encounter: Secondary | ICD-10-CM

## 2023-06-26 DIAGNOSIS — R410 Disorientation, unspecified: Secondary | ICD-10-CM

## 2023-06-30 ENCOUNTER — Ambulatory Visit: Payer: Medicare HMO | Attending: Medical | Admitting: Medical

## 2023-06-30 NOTE — Progress Notes (Deleted)
  Cardiology Office Note:  .   Date:  06/30/2023  ID:  Tina Hayes, DOB Jun 06, 1945, MRN [id number] PCP: Gorden Harms, PA-C (Inactive)  [facility] Providers Cardiologist:  Julien Nordmann, MD { Click to update primary MD,subspecialty MD or APP then REFRESH:1}   History of Present Illness: .   Tina Hayes is a 78 y.o. 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    The patient was seen in 2018 for intermittent chest and shoulder pain and tenderness for many months, which turned out to be GI issues. Since then she has been followed by the office. Echo 01/2019 showed EF 60%, mildly dilated right heart, moderate MR.    Seen in the ER 07/02/20 for chest pain, low suspicion for ACS. She had run out of her PPI. Cardiac work-up was negative, suspected acid-relfux and Nexium was refilled.    Patient seen 07/13/20 and reported lightheadedness, dizziness, and chest pressure. EKG showed bradycardia and coreg was discontinued. Echo and heart monitor ordered. Echo showed normal LV function, mild LVH, G1DD, mild to mod AS. Heart monitor showed SB, brief SVT and NSVT. Orthostatics negative. There was question of metolazone and torsemide dosing. Previous labs showed she was dry. Given normal EF and labs, metolazone was discontinued.   The patient was alst seen 02/2023 and reported good and bad days. She reported chest pain that was reproducible on exam and SOB. Echo was ordered.     ROS: ***  Studies Reviewed: .        *** Risk Assessment/Calculations:   {Does this patient have ATRIAL FIBRILLATION?[phone]} No BP recorded.  {Refresh Note OR Click here to enter BP  :1}***       Physical Exam:   VS:  There were no vitals taken for this visit.   Wt Readings from Last 3 Encounters:  04/06/23 265 lb (120.2 kg)  03/20/23 265 lb 12.8 oz  (120.6 kg)  02/03/23 273 lb (123.8 kg)    GEN: Well nourished, well developed in no acute distress NECK: No JVD; No carotid bruits CARDIAC: ***RRR, no murmurs, rubs, gallops RESPIRATORY:  Clear to auscultation without rales, wheezing or rhonchi  ABDOMEN: Soft, non-tender, non-distended EXTREMITIES:  No edema; No deformity   ASSESSMENT AND PLAN: .   ***    {Are you ordering a CV Procedure (e.g. stress test, cath, DCCV, TEE, etc)?   Press F2        :[id number]}  Dispo: ***  Signed, Kaliel Bolds David Stall, PA-C

## 2023-07-06 ENCOUNTER — Encounter: Payer: Self-pay | Admitting: Anesthesiology

## 2023-07-06 ENCOUNTER — Ambulatory Visit: Attending: Anesthesiology | Admitting: Anesthesiology

## 2023-07-06 DIAGNOSIS — M170 Bilateral primary osteoarthritis of knee: Secondary | ICD-10-CM

## 2023-07-06 DIAGNOSIS — M5136 Other intervertebral disc degeneration, lumbar region with discogenic back pain only: Secondary | ICD-10-CM

## 2023-07-06 DIAGNOSIS — M5432 Sciatica, left side: Secondary | ICD-10-CM | POA: Diagnosis not present

## 2023-07-06 DIAGNOSIS — Z79891 Long term (current) use of opiate analgesic: Secondary | ICD-10-CM

## 2023-07-06 DIAGNOSIS — M47817 Spondylosis without myelopathy or radiculopathy, lumbosacral region: Secondary | ICD-10-CM

## 2023-07-06 DIAGNOSIS — M4716 Other spondylosis with myelopathy, lumbar region: Secondary | ICD-10-CM

## 2023-07-06 DIAGNOSIS — M25512 Pain in left shoulder: Secondary | ICD-10-CM

## 2023-07-06 DIAGNOSIS — M48062 Spinal stenosis, lumbar region with neurogenic claudication: Secondary | ICD-10-CM

## 2023-07-06 DIAGNOSIS — M5431 Sciatica, right side: Secondary | ICD-10-CM | POA: Diagnosis not present

## 2023-07-06 DIAGNOSIS — G4486 Cervicogenic headache: Secondary | ICD-10-CM

## 2023-07-06 DIAGNOSIS — G894 Chronic pain syndrome: Secondary | ICD-10-CM

## 2023-07-06 DIAGNOSIS — F1190 Opioid use, unspecified, uncomplicated: Secondary | ICD-10-CM

## 2023-07-06 MED ORDER — HYDROCODONE-ACETAMINOPHEN 7.5-325 MG PO TABS: 1.0000 | ORAL_TABLET | Freq: Four times a day (QID) | ORAL | 0 refills | Status: DC | PRN

## 2023-07-06 NOTE — Progress Notes (Unsigned)
 Virtual Visit via Telephone Note  I connected with Tina Hayes on 07/06/23 at  9:00 AM EDT by telephone and verified that I am speaking with the correct person using two identifiers.  Location: Patient: Home Provider: Pain control center   I discussed the limitations, risks, security and privacy concerns of performing an evaluation and management service by telephone and the availability of in person appointments. I also discussed with the patient that there may be a patient responsible charge related to this service. The patient expressed understanding and agreed to proceed.   History of Present Illness: I spoke with Tina Hayes via telephone as we were unable link for the video portion of the conference.  She reports that her lower extremity sciatica symptoms have recurred.  She did very well following her December caudal epidural.  She had 2 months of 80 to 100% relief of her lower extremity sciatica and 50% to 70% reduction in her low back pain.  She has been able to function on her hydrocodone 7.5 mg tablets 4 times a day fairly effectively but this pain has gotten worse and recurred.  Her medicine is less effective and she is desiring to proceed with a repeat caudal at her next visit as soon as possible.  Otherwise no change in lower extremity strength function bowel or bladder function is noted.  She reports no side effects with the hydrocodone and continues to get reasonably good relief with this rated at 50% lasting 4 to 6 hours with the recent increase in pain.  She is having trouble sleeping at night and is more restless with more limitation on her activity during the day.  Review of systems: General: No fevers or chills Pulmonary: No shortness of breath or dyspnea Cardiac: No angina or palpitations or lightheadedness GI: No abdominal pain or constipation Psych: No depression    Observations/Objective:  Current Outpatient Medications:    acetaminophen (TYLENOL) 500 MG tablet,  Take 500 mg by mouth every 6 (six) hours as needed for headache, moderate pain or fever., Disp: , Rfl:    albuterol (PROVENTIL HFA;VENTOLIN HFA) 108 (90 Base) MCG/ACT inhaler, Inhale 2 puffs into the lungs every 6 (six) hours as needed for wheezing or shortness of breath., Disp: 1 Inhaler, Rfl: 2   albuterol (PROVENTIL) (2.5 MG/3ML) 0.083% nebulizer solution, Take 2.5 mg every 4 (four) hours as needed by nebulization for wheezing or shortness of breath., Disp: , Rfl:    amitriptyline (ELAVIL) 25 MG tablet, Take 25 mg by mouth at bedtime., Disp: , Rfl:    amLODipine (NORVASC) 5 MG tablet, Take 1 tablet by mouth once daily, Disp: 90 tablet, Rfl: 1   atorvastatin (LIPITOR) 40 MG tablet, Take 1 tablet (40 mg total) by mouth daily., Disp: 90 tablet, Rfl: 3   B-D UF III MINI PEN NEEDLES 31G X 5 MM MISC, USE WITH INSULIN PEN INJECTIONS TWICE DAILY, Disp: , Rfl:    carvedilol (COREG) 3.125 MG tablet, Take 1 tablet (3.125 mg total) by mouth 2 (two) times daily with a meal., Disp: 90 tablet, Rfl: 1   clopidogrel (PLAVIX) 75 MG tablet, Take 75 mg by mouth daily., Disp: , Rfl:    divalproex (DEPAKOTE) 250 MG DR tablet, Take 250 mg by mouth 2 (two) times daily., Disp: , Rfl:    DUPIXENT 300 MG/2ML SOPN, Inject into the skin., Disp: , Rfl:    Ferrous Sulfate (IRON) 325 (65 Fe) MG TABS, Take 1 tablet by mouth daily., Disp: , Rfl:  HYDROcodone-acetaminophen (NORCO) 7.5-325 MG tablet, Take 1 tablet by mouth every 6 (six) hours as needed for moderate pain (pain score 4-6) or severe pain (pain score 7-10)., Disp: 120 tablet, Rfl: 0   insulin aspart (NOVOLOG) 100 UNIT/ML FlexPen, Inject 8 units subQ if blood sugar is consistently over 300 for more than 6 hours despite taking your other prescribed insulin. Repeat up to three times a day (every 8 hours) until blood sugars improve., Disp: 15 mL, Rfl: 0   LANTUS SOLOSTAR 100 UNIT/ML Solostar Pen, Inject 10 Units into the skin See admin instructions. Inject 30 units  subcutaneously in the morning at 35 units subcutaneously at bedtime., Disp: 15 mL, Rfl: 11   levETIRAcetam (KEPPRA) 750 MG tablet, Take 1,500 mg by mouth 2 (two) times daily., Disp: , Rfl:    losartan (COZAAR) 100 MG tablet, Take 1 tablet by mouth once daily, Disp: 90 tablet, Rfl: 3   montelukast (SINGULAIR) 10 MG tablet, Take 10 mg by mouth daily., Disp: , Rfl:    naloxone (NARCAN) nasal spray 4 mg/0.1 mL, As directed for opioid induced respiratory depression, Disp: 1 each, Rfl: 1   nitroGLYCERIN (NITROSTAT) 0.4 MG SL tablet, DISSOLVE ONE TABLET UNDER THE TONGUE EVERY 5 MINUTES AS NEEDED FOR CHEST PAIN.  DO NOT EXCEED A TOTAL OF 3 DOSES IN 15 MINUTES, Disp: 25 tablet, Rfl: 0   omeprazole (PRILOSEC) 40 MG capsule, TAKE 1 CAPSULE BY MOUTH TWICE DAILY BEFORE A MEAL, Disp: 180 capsule, Rfl: 0   pantoprazole (PROTONIX) 40 MG tablet, Take 40 mg by mouth 2 (two) times daily., Disp: , Rfl:    sertraline (ZOLOFT) 100 MG tablet, Take 150 mg by mouth daily., Disp: , Rfl:    sucralfate (CARAFATE) 1 GM/10ML suspension, Take 10 mLs (1 g total) by mouth 4 (four) times daily -  with meals and at bedtime., Disp: 420 mL, Rfl: 0   tiotropium (SPIRIVA) 18 MCG inhalation capsule, Place 18 mcg into inhaler and inhale daily., Disp: , Rfl:    traZODone (DESYREL) 50 MG tablet, Take 50 mg by mouth at bedtime., Disp: , Rfl:    Past Medical History:  Diagnosis Date   (HFpEF) heart failure with preserved ejection fraction (HCC)    a. 05/2016 Echo: EF 60-65%, mild to mod LVH, Gr1 DD, mild MR, mildly dil LA, mod TR, mildly to mod increased PASP; b. 07/2020 Echo: EF 55-60%, no rwma, mild LVH, GrIDD, mildly dil LA, mild MR, mild-mod TR, mild-mod AoV sclerosis w/o stenosis.   Anxiety    Arthritis    Chronic back pain    COPD (chronic obstructive pulmonary disease) (HCC)    Coronary artery disease    a. s/p remote PCI x 5;  b. 2006 s/p CABG x 3 ([place], [place] - [facility]); b. 05/2016 MV: attenuation corrected  images w/o ischemia or wma-->Med rx; c. 06/2020 MV: EF 55-65%, no isch/infart-->low risk.   Depression    Diabetes mellitus without complication (HCC)    Essential hypertension 06/30/2016   GERD (gastroesophageal reflux disease)    Heart attack (HCC)    Total of 3 per pt.   Hyperlipidemia LDL goal <55    Hypertensive urgency 06/03/2015   PAD (peripheral artery disease) (HCC)    a. 06/2019 Angio: R AT (PTA), R POP (PTA & Viabahn stenting); b. 09/2020 ABIs: R 1.24, L 1.35.   Palpitations    a. 06/2020 Zio: Sinus brady, 54 (40-143), 4 beats NSVT, 3 SVT runs (max 143 bpm x 4  beats).   Seizure (HCC)    Assessment and Plan:  1. Chronic pain syndrome   2. Chronic, continuous use of opioids   3. Degeneration of intervertebral disc of lumbar region with discogenic back pain   4. Bilateral sciatica   5. Spinal stenosis of lumbar region with neurogenic claudication   6. Facet arthritis of lumbosacral region   7. Lumbar spondylosis with myelopathy   8. Cervicogenic headache   9. Morbid obesity (HCC)   10. Primary osteoarthritis of both knees   11. Pain in joint of left shoulder    Based on our conversation I think it is appropriate to refill her medicines for the next month dated for March 10.  She is due for urine screen and we have requested that she come in this week for that.  Refill will be for 7.5 mg hydrocodone 4 times daily dosing.  Continue efforts at weight loss stretching strengthening with a scheduled caudal epidural here within the next month.  She knows to come off her blood thinners in advance per routine protocol.  Continue follow-up with her primary care physicians for baseline medical care. Follow Up Instructions:    I discussed the assessment and treatment plan with the patient. The patient was provided an opportunity to ask questions and all were answered. The patient agreed with the plan and demonstrated an understanding of the instructions.   The patient was advised to call  back or seek an in-person evaluation if the symptoms worsen or if the condition fails to improve as anticipated.  I provided 30 minutes of non-face-to-face time during this encounter.   Yevette Edwards, MD

## 2023-07-10 ENCOUNTER — Other Ambulatory Visit
Admission: RE | Admit: 2023-07-10 | Discharge: 2023-07-10 | Disposition: A | Discharge status: Home / Self Care | Attending: Anesthesiology | Admitting: Anesthesiology

## 2023-07-10 DIAGNOSIS — F1190 Opioid use, unspecified, uncomplicated: Secondary | ICD-10-CM | POA: Insufficient documentation

## 2023-07-10 DIAGNOSIS — G894 Chronic pain syndrome: Secondary | ICD-10-CM | POA: Insufficient documentation

## 2023-07-15 LAB — MISC LABCORP TEST (SEND OUT): Labcorp test code: 738526

## 2023-07-28 ENCOUNTER — Emergency Department

## 2023-07-28 ENCOUNTER — Other Ambulatory Visit: Payer: Self-pay

## 2023-07-28 DIAGNOSIS — Z7902 Long term (current) use of antithrombotics/antiplatelets: Secondary | ICD-10-CM | POA: Insufficient documentation

## 2023-07-28 DIAGNOSIS — Z951 Presence of aortocoronary bypass graft: Secondary | ICD-10-CM | POA: Diagnosis not present

## 2023-07-28 DIAGNOSIS — Z794 Long term (current) use of insulin: Secondary | ICD-10-CM | POA: Insufficient documentation

## 2023-07-28 DIAGNOSIS — R079 Chest pain, unspecified: Secondary | ICD-10-CM | POA: Diagnosis not present

## 2023-07-28 DIAGNOSIS — I5032 Chronic diastolic (congestive) heart failure: Secondary | ICD-10-CM | POA: Insufficient documentation

## 2023-07-28 DIAGNOSIS — Z79899 Other long term (current) drug therapy: Secondary | ICD-10-CM | POA: Insufficient documentation

## 2023-07-28 DIAGNOSIS — J449 Chronic obstructive pulmonary disease, unspecified: Secondary | ICD-10-CM | POA: Insufficient documentation

## 2023-07-28 DIAGNOSIS — Z96652 Presence of left artificial knee joint: Secondary | ICD-10-CM | POA: Diagnosis not present

## 2023-07-28 DIAGNOSIS — I6509 Occlusion and stenosis of unspecified vertebral artery: Secondary | ICD-10-CM | POA: Diagnosis not present

## 2023-07-28 DIAGNOSIS — I2510 Atherosclerotic heart disease of native coronary artery without angina pectoris: Secondary | ICD-10-CM | POA: Diagnosis not present

## 2023-07-28 DIAGNOSIS — Z87891 Personal history of nicotine dependence: Secondary | ICD-10-CM | POA: Diagnosis not present

## 2023-07-28 DIAGNOSIS — I5020 Unspecified systolic (congestive) heart failure: Secondary | ICD-10-CM | POA: Diagnosis not present

## 2023-07-28 DIAGNOSIS — R1013 Epigastric pain: Secondary | ICD-10-CM | POA: Insufficient documentation

## 2023-07-28 DIAGNOSIS — K2970 Gastritis, unspecified, without bleeding: Secondary | ICD-10-CM | POA: Insufficient documentation

## 2023-07-28 DIAGNOSIS — E11649 Type 2 diabetes mellitus with hypoglycemia without coma: Secondary | ICD-10-CM | POA: Diagnosis not present

## 2023-07-28 DIAGNOSIS — E1165 Type 2 diabetes mellitus with hyperglycemia: Secondary | ICD-10-CM | POA: Insufficient documentation

## 2023-07-28 DIAGNOSIS — K921 Melena: Principal | ICD-10-CM | POA: Insufficient documentation

## 2023-07-28 LAB — COMPREHENSIVE METABOLIC PANEL WITH GFR
ALT: 16 U/L (ref 0–44)
AST: 18 U/L (ref 15–41)
Albumin: 3.6 g/dL (ref 3.5–5.0)
Alkaline Phosphatase: 137 U/L — ABNORMAL HIGH (ref 38–126)
Anion gap: 8 (ref 5–15)
BUN: 15 mg/dL (ref 8–23)
CO2: 21 mmol/L — ABNORMAL LOW (ref 22–32)
Calcium: 9 mg/dL (ref 8.9–10.3)
Chloride: 108 mmol/L (ref 98–111)
Creatinine, Ser: 0.99 mg/dL (ref 0.44–1.00)
GFR, Estimated: 59 mL/min — ABNORMAL LOW (ref >60)
Glucose, Bld: 145 mg/dL — ABNORMAL HIGH (ref 70–99)
Potassium: 3.6 mmol/L (ref 3.5–5.1)
Sodium: 137 mmol/L (ref 135–145)
Total Bilirubin: 0.3 mg/dL (ref 0.0–1.2)
Total Protein: 7.1 g/dL (ref 6.5–8.1)

## 2023-07-28 LAB — CBC
HCT: 34.7 % — ABNORMAL LOW (ref 36.0–46.0)
Hemoglobin: 11.2 g/dL — ABNORMAL LOW (ref 12.0–15.0)
MCH: 27.3 pg (ref 26.0–34.0)
MCHC: 32.3 g/dL (ref 30.0–36.0)
MCV: 84.6 fL (ref 80.0–100.0)
Platelets: 228 10*3/uL (ref 150–400)
RBC: 4.1 MIL/uL (ref 3.87–5.11)
RDW: 13.3 % (ref 11.5–15.5)
WBC: 4.3 10*3/uL (ref 4.0–10.5)
nRBC: 0 % (ref 0.0–0.2)

## 2023-07-28 LAB — TYPE AND SCREEN
ABO/RH(D): O POS
Antibody Screen: NEGATIVE

## 2023-07-28 LAB — TROPONIN I (HIGH SENSITIVITY): Troponin I (High Sensitivity): 9 ng/L (ref <18)

## 2023-07-28 NOTE — ED Triage Notes (Signed)
 Pt to ED via POV c/o black tarry stools x2 weeks. Pt has not started any new meds/supplements. Pt also complaining of worsening CP and SHOB that started 1 week ago. Has had cough for about 2-3 days and has been around people with the flu. Was seen by PCP  yesterday and was told to come here. Denies fevers, dizzineds, N/V

## 2023-07-29 ENCOUNTER — Inpatient Hospital Stay: Admitting: Anesthesiology

## 2023-07-29 ENCOUNTER — Encounter
Admission: EM | Disposition: A | Discharge status: Home / Self Care | Payer: Self-pay | Source: Home / Self Care | Attending: Emergency Medicine

## 2023-07-29 ENCOUNTER — Observation Stay
Admission: EM | Admit: 2023-07-29 | Discharge: 2023-07-31 | Disposition: A | Discharge status: Home / Self Care | Attending: Student | Admitting: Student

## 2023-07-29 DIAGNOSIS — R1013 Epigastric pain: Secondary | ICD-10-CM

## 2023-07-29 DIAGNOSIS — Q399 Congenital malformation of esophagus, unspecified: Secondary | ICD-10-CM

## 2023-07-29 DIAGNOSIS — K449 Diaphragmatic hernia without obstruction or gangrene: Secondary | ICD-10-CM

## 2023-07-29 DIAGNOSIS — K921 Melena: Secondary | ICD-10-CM | POA: Diagnosis not present

## 2023-07-29 DIAGNOSIS — K259 Gastric ulcer, unspecified as acute or chronic, without hemorrhage or perforation: Secondary | ICD-10-CM | POA: Diagnosis not present

## 2023-07-29 DIAGNOSIS — R079 Chest pain, unspecified: Principal | ICD-10-CM

## 2023-07-29 DIAGNOSIS — D649 Anemia, unspecified: Secondary | ICD-10-CM

## 2023-07-29 DIAGNOSIS — K2970 Gastritis, unspecified, without bleeding: Secondary | ICD-10-CM

## 2023-07-29 HISTORY — PX: ESOPHAGOGASTRODUODENOSCOPY: SHX5428

## 2023-07-29 LAB — CBG MONITORING, ED
Glucose-Capillary: 125 mg/dL — ABNORMAL HIGH (ref 70–99)
Glucose-Capillary: 105 mg/dL — ABNORMAL HIGH (ref 70–99)

## 2023-07-29 LAB — BASIC METABOLIC PANEL WITH GFR
Anion gap: 6 (ref 5–15)
BUN: 15 mg/dL (ref 8–23)
CO2: 24 mmol/L (ref 22–32)
Calcium: 8.5 mg/dL — ABNORMAL LOW (ref 8.9–10.3)
Chloride: 111 mmol/L (ref 98–111)
Creatinine, Ser: 0.71 mg/dL (ref 0.44–1.00)
GFR, Estimated: >60 mL/min (ref >60)
Glucose, Bld: 124 mg/dL — ABNORMAL HIGH (ref 70–99)
Potassium: 3.5 mmol/L (ref 3.5–5.1)
Sodium: 141 mmol/L (ref 135–145)

## 2023-07-29 LAB — HEMOGLOBIN
Hemoglobin: 10.8 g/dL — ABNORMAL LOW (ref 12.0–15.0)
Hemoglobin: 10.5 g/dL — ABNORMAL LOW (ref 12.0–15.0)
Hemoglobin: 10.9 g/dL — ABNORMAL LOW (ref 12.0–15.0)
Hemoglobin: 11.4 g/dL — ABNORMAL LOW (ref 12.0–15.0)

## 2023-07-29 LAB — PHOSPHORUS: Phosphorus: 3.4 mg/dL (ref 2.5–4.6)

## 2023-07-29 LAB — MAGNESIUM: Magnesium: 1.9 mg/dL (ref 1.7–2.4)

## 2023-07-29 LAB — GLUCOSE, CAPILLARY
Glucose-Capillary: 112 mg/dL — ABNORMAL HIGH (ref 70–99)
Glucose-Capillary: 197 mg/dL — ABNORMAL HIGH (ref 70–99)

## 2023-07-29 LAB — HEMOGLOBIN A1C
Hgb A1c MFr Bld: 7.2 % — ABNORMAL HIGH (ref 4.8–5.6)
Mean Plasma Glucose: 159.94 mg/dL

## 2023-07-29 LAB — TROPONIN I (HIGH SENSITIVITY): Troponin I (High Sensitivity): 11 ng/L (ref <18)

## 2023-07-29 SURGERY — EGD (ESOPHAGOGASTRODUODENOSCOPY)
Anesthesia: General

## 2023-07-29 MED ORDER — MORPHINE SULFATE (PF) 2 MG/ML IV SOLN: 2.0000 mg | INTRAVENOUS | Status: DC | PRN

## 2023-07-29 MED ORDER — PANTOPRAZOLE SODIUM 40 MG IV SOLR
40.0000 mg | Freq: Once | INTRAVENOUS | Status: AC
  Administered 2023-07-29: 40 mg via INTRAVENOUS
  Filled 2023-07-29: qty 10

## 2023-07-29 MED ORDER — DIVALPROEX SODIUM 250 MG PO DR TAB: 250.0000 mg | DELAYED_RELEASE_TABLET | Freq: Two times a day (BID) | ORAL | 11 refills | Status: AC

## 2023-07-29 MED ORDER — HYDROCODONE-ACETAMINOPHEN 5-325 MG PO TABS
1.0000 | ORAL_TABLET | ORAL | Status: DC | PRN
  Administered 2023-07-30: 1 via ORAL
  Filled 2023-07-29 (×2): qty 1

## 2023-07-29 MED ORDER — PANTOPRAZOLE SODIUM 40 MG IV SOLR: 40.0000 mg | Freq: Every day | INTRAVENOUS | Status: DC

## 2023-07-29 MED ORDER — TRAZODONE HCL 50 MG PO TABS
50.0000 mg | ORAL_TABLET | Freq: Every day | ORAL | Status: DC
  Administered 2023-07-29 – 2023-07-30 (×2): 50 mg via ORAL
  Filled 2023-07-29 (×2): qty 1

## 2023-07-29 MED ORDER — IPRATROPIUM-ALBUTEROL 0.5-2.5 (3) MG/3ML IN SOLN
3.0000 mL | Freq: Once | RESPIRATORY_TRACT | Status: AC
  Administered 2023-07-29: 3 mL via RESPIRATORY_TRACT

## 2023-07-29 MED ORDER — HYDRALAZINE HCL 20 MG/ML IJ SOLN: 5.0000 mg | INTRAMUSCULAR | Status: DC | PRN

## 2023-07-29 MED ORDER — INSULIN ASPART 100 UNIT/ML IJ SOLN
0.0000 [IU] | Freq: Three times a day (TID) | INTRAMUSCULAR | Status: DC
  Administered 2023-07-30 – 2023-07-31 (×3): 2 [IU] via SUBCUTANEOUS
  Administered 2023-07-31: 3 [IU] via SUBCUTANEOUS
  Filled 2023-07-29 (×4): qty 1

## 2023-07-29 MED ORDER — LIDOCAINE HCL (PF) 2 % IJ SOLN
INTRAMUSCULAR | Status: AC
  Filled 2023-07-29: qty 5

## 2023-07-29 MED ORDER — FENTANYL CITRATE (PF) 100 MCG/2ML IJ SOLN
INTRAMUSCULAR | Status: DC | PRN
  Administered 2023-07-29: 50 ug via INTRAVENOUS

## 2023-07-29 MED ORDER — FENTANYL CITRATE (PF) 100 MCG/2ML IJ SOLN
INTRAMUSCULAR | Status: AC
  Filled 2023-07-29: qty 2

## 2023-07-29 MED ORDER — INSULIN ASPART 100 UNIT/ML IJ SOLN: 0.0000 [IU] | Freq: Every day | INTRAMUSCULAR | Status: DC

## 2023-07-29 MED ORDER — ONDANSETRON HCL 4 MG PO TABS: 4.0000 mg | ORAL_TABLET | Freq: Four times a day (QID) | ORAL | Status: DC | PRN

## 2023-07-29 MED ORDER — SODIUM CHLORIDE 0.9 % IV BOLUS
500.0000 mL | Freq: Once | INTRAVENOUS | Status: AC
  Administered 2023-07-29: 500 mL via INTRAVENOUS

## 2023-07-29 MED ORDER — LEVETIRACETAM 750 MG PO TABS: 1500.0000 mg | ORAL_TABLET | Freq: Two times a day (BID) | ORAL | 11 refills | Status: AC

## 2023-07-29 MED ORDER — SODIUM CHLORIDE 0.9 % IV SOLN: INTRAVENOUS | Status: DC

## 2023-07-29 MED ORDER — LEVETIRACETAM 500 MG PO TABS
1500.0000 mg | ORAL_TABLET | Freq: Two times a day (BID) | ORAL | Status: DC
Start: 2023-07-29 — End: 2023-07-31
  Administered 2023-07-29 – 2023-07-31 (×5): 1500 mg via ORAL
  Filled 2023-07-29 (×5): qty 3

## 2023-07-29 MED ORDER — ACETAMINOPHEN 650 MG RE SUPP
650.0000 mg | Freq: Four times a day (QID) | RECTAL | Status: DC | PRN
Start: 2023-07-29 — End: 2023-07-31

## 2023-07-29 MED ORDER — NITROGLYCERIN 0.4 MG SL SUBL: 0.4000 mg | SUBLINGUAL_TABLET | SUBLINGUAL | Status: DC | PRN

## 2023-07-29 MED ORDER — ONDANSETRON HCL 4 MG/2ML IJ SOLN: 4.0000 mg | Freq: Four times a day (QID) | INTRAMUSCULAR | Status: DC | PRN

## 2023-07-29 MED ORDER — ALBUTEROL SULFATE (2.5 MG/3ML) 0.083% IN NEBU: 2.5000 mg | INHALATION_SOLUTION | RESPIRATORY_TRACT | Status: DC | PRN

## 2023-07-29 MED ORDER — DIVALPROEX SODIUM 250 MG PO DR TAB
250.0000 mg | DELAYED_RELEASE_TABLET | Freq: Two times a day (BID) | ORAL | Status: DC
Start: 2023-07-29 — End: 2023-07-31
  Administered 2023-07-29 – 2023-07-31 (×5): 250 mg via ORAL
  Filled 2023-07-29 (×5): qty 1

## 2023-07-29 MED ORDER — INSULIN ASPART 100 UNIT/ML IJ SOLN: 0.0000 [IU] | INTRAMUSCULAR | Status: DC

## 2023-07-29 MED ORDER — LIDOCAINE HCL (CARDIAC) PF 100 MG/5ML IV SOSY
PREFILLED_SYRINGE | INTRAVENOUS | Status: DC | PRN
  Administered 2023-07-29: 60 mg via INTRAVENOUS

## 2023-07-29 MED ORDER — ACETAMINOPHEN 325 MG PO TABS: 650.0000 mg | ORAL_TABLET | Freq: Four times a day (QID) | ORAL | Status: DC | PRN

## 2023-07-29 MED ORDER — PANTOPRAZOLE SODIUM 40 MG IV SOLR
40.0000 mg | Freq: Two times a day (BID) | INTRAVENOUS | Status: DC
  Administered 2023-07-29 (×2): 40 mg via INTRAVENOUS
  Filled 2023-07-29 (×2): qty 10

## 2023-07-29 MED ORDER — GLYCOPYRROLATE 0.2 MG/ML IJ SOLN
INTRAMUSCULAR | Status: DC | PRN
  Administered 2023-07-29: .2 mg via INTRAVENOUS

## 2023-07-29 MED ORDER — EPHEDRINE 5 MG/ML INJ
INTRAVENOUS | Status: AC
  Filled 2023-07-29: qty 5

## 2023-07-29 MED ORDER — PROPOFOL 500 MG/50ML IV EMUL
INTRAVENOUS | Status: DC | PRN
Start: 2023-07-29 — End: 2023-07-29
  Administered 2023-07-29: 90 ug/kg/min via INTRAVENOUS
  Administered 2023-07-29: 50 mg via INTRAVENOUS

## 2023-07-29 MED ORDER — PROPOFOL 10 MG/ML IV BOLUS
INTRAVENOUS | Status: AC
Start: 2023-07-29 — End: ?
  Filled 2023-07-29: qty 40

## 2023-07-29 MED ORDER — IPRATROPIUM-ALBUTEROL 0.5-2.5 (3) MG/3ML IN SOLN
RESPIRATORY_TRACT | Status: AC
  Filled 2023-07-29: qty 3

## 2023-07-29 MED ORDER — ONDANSETRON HCL 4 MG/2ML IJ SOLN
4.0000 mg | Freq: Once | INTRAMUSCULAR | Status: AC
  Administered 2023-07-29: 4 mg via INTRAVENOUS
  Filled 2023-07-29: qty 2

## 2023-07-29 NOTE — Anesthesia Postprocedure Evaluation (Signed)
 Anesthesia Post Note  Patient: Tina Hayes  Procedure(s) Performed: EGD (ESOPHAGOGASTRODUODENOSCOPY)  Patient location during evaluation: Endoscopy Anesthesia Type: General Level of consciousness: awake and alert Pain management: pain level controlled Vital Signs Assessment: post-procedure vital signs reviewed and stable Respiratory status: spontaneous breathing, nonlabored ventilation, respiratory function stable and patient connected to nasal cannula oxygen Cardiovascular status: blood pressure returned to baseline and stable Postop Assessment: no apparent nausea or vomiting Anesthetic complications: no   No notable events documented.   Last Vitals:  Vitals:   07/29/23 1139 07/29/23 1149  BP: 135/72 (!) 149/73  Pulse: (!) 59   Resp: 13   Temp: 36.4 C   SpO2: 100%     Last Pain:  Vitals:   07/29/23 1209  TempSrc:   PainSc: 7                  Corinda Gubler

## 2023-07-29 NOTE — Progress Notes (Signed)
 The patient's upper endoscopy showed some mild gastritis with a large hiatal hernia which is likely the cause of her recurrent melena with nausea vomiting.  The patient has continued admissions for the same with [facility] ulcers found in the hiatal hernia.  There is no active GI bleeding nor is there anything to correct endoscopically.  I would suggest the patient be considered for hiatal hernia repair with antireflux surgery.  I will sign off.  Please call if any further GI concerns or questions.  We would like to thank you for the opportunity to participate in the care of Tina Hayes.

## 2023-07-29 NOTE — Consult Note (Signed)
 Midge Minium, MD [facility]  [address], [address] [place], [place] [address] Phone: [phone] Fax : [phone]  Consultation  Referring Provider:     Dr. Para March Primary Care Physician:  Gorden Harms, PA-C (Inactive) Primary Gastroenterologist:  Dr. Allegra Lai (discharged from the practice for noncompliance earlier this year)         Reason for Consultation:     GI bleeding  Date of Admission:  07/29/2023 Date of Consultation:  07/29/2023         HPI:   Tina Hayes is a 78 y.o. 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  The patient had a repeat upper endoscopy in September 2024 after that upper endoscopy in October 2022 that showed  Impression:  - Normal examined duodenum.  - Large hiatal hernia.  - Abnormal esophageal motility.  - Tortuous esophagus.  The patient had reported to her PCP that she was having black tarry stools for 2 weeks with generalized weakness.  She also had dyspnea on exertion with chest tightness.  The patient's blood work recently showed:  Component     Latest Ref Rng 01/03/2023 01/04/2023 07/28/2023 07/29/2023  Hemoglobin     12.0 - 15.0 g/dL 8.5 (L)  9.5 (L)  13.0 (L)  10.8 (L)   Hemoglobin      9.1 (L)  8.7 (L)     Hemoglobin      8.0 (L)      Hemoglobin      8.5 (L)       The patient had a colonoscopy by Dr. Mechele Collin in 2019 that showed diverticulosis of the sigmoid colon with internal hemorrhoids.  The patient reports that her melena has been better with a less black bowel movement yesterday.  She states that she came into the hospital because she has been having nausea and vomiting without any blood in the vomitus.  Past Medical History:  Diagnosis Date   (HFpEF) heart failure with preserved ejection fraction (HCC)    a. 05/2016 Echo: EF 60-65%, mild to mod LVH, Gr1 DD, mild MR, mildly dil LA, mod TR, mildly to mod increased PASP; b.  07/2020 Echo: EF 55-60%, no rwma, mild LVH, GrIDD, mildly dil LA, mild MR, mild-mod TR, mild-mod AoV sclerosis w/o stenosis.   Anxiety    Arthritis    Chronic back pain    COPD (chronic obstructive pulmonary disease) (HCC)    Coronary artery disease    a. s/p remote PCI x 5;  b. 2006 s/p CABG x 3 ([place] [place] [facility]); b. 05/2016 MV: attenuation corrected images w/o ischemia or wma-->Med rx; c. 06/2020 MV: EF 55-65%, no isch/infart-->low risk.   Depression    Diabetes mellitus without complication (HCC)    Essential hypertension 06/30/2016   GERD (gastroesophageal reflux disease)    Heart attack (HCC)    Total of 3 per pt.   Hyperlipidemia LDL goal <55    Hypertensive urgency 06/03/2015   PAD (peripheral artery disease) (HCC)    a. 06/2019 Angio: R AT (PTA), R POP (PTA & Viabahn stenting); b. 09/2020 ABIs: R 1.24, L 1.35.   Palpitations    a. 06/2020 Zio: Sinus brady, 54 (40-143), 4 beats NSVT, 3 SVT runs (max 143 bpm x 4 beats).   Seizure [facility])     Past Surgical History:  Procedure Laterality Date  ABDOMINAL HYSTERECTOMY     ANKLE SURGERY Right    CATARACT EXTRACTION W/ INTRAOCULAR LENS  IMPLANT, BILATERAL     COLONOSCOPY WITH PROPOFOL N/A 05/04/2017   Procedure: COLONOSCOPY WITH PROPOFOL;  Surgeon: Scot Jun, MD;  Location: [facility] ENDOSCOPY;  Service: Endoscopy;  Laterality: N/A;   CORONARY ANGIOPLASTY     CORONARY ARTERY BYPASS GRAFT     TRIPLE BYPASS   ESOPHAGOGASTRODUODENOSCOPY N/A 02/25/2021   Procedure: ESOPHAGOGASTRODUODENOSCOPY (EGD);  Surgeon: Toney Reil, MD;  Location: [facility] ENDOSCOPY;  Service: Gastroenterology;  Laterality: N/A;   ESOPHAGOGASTRODUODENOSCOPY (EGD) WITH PROPOFOL N/A 05/04/2017   Procedure: ESOPHAGOGASTRODUODENOSCOPY (EGD) WITH PROPOFOL;  Surgeon: Scot Jun, MD;  Location: [facility] ENDOSCOPY;  Service: Endoscopy;  Laterality: N/A;   ESOPHAGOGASTRODUODENOSCOPY (EGD) WITH PROPOFOL N/A 01/05/2023   Procedure:  ESOPHAGOGASTRODUODENOSCOPY (EGD) WITH PROPOFOL;  Surgeon: Wyline Mood, MD;  Location: [facility] ENDOSCOPY;  Service: Gastroenterology;  Laterality: N/A;   EXCISION MASS LOWER EXTREMETIES Right 03/02/2018   Procedure: EXCISION SOFT TISSUE MASS FROM MEDIAL ASPECT OF RIGHT ANKLE;  Surgeon: Christena Flake, MD;  Location: [facility] ORS;  Service: Orthopedics;  Laterality: Right;   FRACTURE SURGERY     JOINT REPLACEMENT     right   KNEE SURGERY Right    LOWER EXTREMITY ANGIOGRAPHY Right 07/14/2019   Procedure: LOWER EXTREMITY ANGIOGRAPHY;  Surgeon: Annice Needy, MD;  Location: [facility] INVASIVE CV LAB;  Service: Cardiovascular;  Laterality: Right;   MOUTH SURGERY Left 07/14/2017   TOTAL KNEE ARTHROPLASTY Left 09/27/2019   Procedure: TOTAL KNEE ARTHROPLASTY;  Surgeon: Christena Flake, MD;  Location: [facility] ORS;  Service: Orthopedics;  Laterality: Left;    Prior to Admission medications   Medication Sig Start Date End Date Taking? Authorizing Provider  acetaminophen (TYLENOL) 500 MG tablet Take 500 mg by mouth every 6 (six) hours as needed for headache, moderate pain or fever.    [provider]  albuterol (PROVENTIL HFA;VENTOLIN HFA) 108 (90 Base) MCG/ACT inhaler Inhale 2 puffs into the lungs every 6 (six) hours as needed for wheezing or shortness of breath. 08/30/15   Katha Hamming, MD  albuterol (PROVENTIL) (2.5 MG/3ML) 0.083% nebulizer solution Take 2.5 mg every 4 (four) hours as needed by nebulization for wheezing or shortness of breath.    [provider]  amitriptyline (ELAVIL) 25 MG tablet Take 25 mg by mouth at bedtime. 01/20/22   [provider]  amLODipine (NORVASC) 5 MG tablet Take 1 tablet by mouth once daily 09/23/22   Antonieta Iba, MD  atorvastatin (LIPITOR) 40 MG tablet Take 1 tablet (40 mg total) by mouth daily. 02/04/22   Antonieta Iba, MD  B-D UF III MINI PEN NEEDLES 31G X 5 MM MISC USE WITH INSULIN PEN INJECTIONS TWICE DAILY 08/05/18   [provider]   carvedilol (COREG) 3.125 MG tablet Take 1 tablet (3.125 mg total) by mouth 2 (two) times daily with a meal. 03/20/23   Furth, Cadence H, PA-C  clopidogrel (PLAVIX) 75 MG tablet Take 75 mg by mouth daily.    [provider]  divalproex (DEPAKOTE) 250 MG DR tablet Take 250 mg by mouth 2 (two) times daily. 07/28/22   [provider]  DUPIXENT 300 MG/2ML SOPN Inject into the skin. 12/30/21   [provider]  Ferrous Sulfate (IRON) 325 (65 Fe) MG TABS Take 1 tablet by mouth daily. 09/01/22   [provider]  HYDROcodone-acetaminophen (NORCO) 7.5-325 MG tablet Take 1 tablet by mouth every 6 (six) hours as  needed for moderate pain (pain score 4-6) or severe pain (pain score 7-10). 07/06/23 08/05/23  Yevette Edwards, MD  insulin aspart (NOVOLOG) 100 UNIT/ML FlexPen Inject 8 units subQ if blood sugar is consistently over 300 for more than 6 hours despite taking your other prescribed insulin. Repeat up to three times a day (every 8 hours) until blood sugars improve. 12/10/22   Shaune Pollack, MD  LANTUS SOLOSTAR 100 UNIT/ML Solostar Pen Inject 10 Units into the skin See admin instructions. Inject 30 units subcutaneously in the morning at 35 units subcutaneously at bedtime. 01/05/23   Marrion Coy, MD  levETIRAcetam (KEPPRA) 750 MG tablet Take 1,500 mg by mouth 2 (two) times daily.    [provider]  losartan (COZAAR) 100 MG tablet Take 1 tablet by mouth once daily 04/03/23   Antonieta Iba, MD  montelukast (SINGULAIR) 10 MG tablet Take 10 mg by mouth daily. 02/02/22   [provider]  naloxone Jonelle Sports) nasal spray 4 mg/0.1 mL As directed for opioid induced respiratory depression 02/03/23   Yevette Edwards, MD  nitroGLYCERIN (NITROSTAT) 0.4 MG SL tablet DISSOLVE ONE TABLET UNDER THE TONGUE EVERY 5 MINUTES AS NEEDED FOR CHEST PAIN.  DO NOT EXCEED A TOTAL OF 3 DOSES IN 15 MINUTES 02/04/22   Gollan, Tollie Pizza, MD  omeprazole (PRILOSEC) 40 MG capsule TAKE 1 CAPSULE BY MOUTH  TWICE DAILY BEFORE A MEAL 04/28/23   Vanga, Loel Dubonnet, MD  pantoprazole (PROTONIX) 40 MG tablet Take 40 mg by mouth 2 (two) times daily. 04/27/23   [provider]  sertraline (ZOLOFT) 100 MG tablet Take 150 mg by mouth daily. 11/27/21   [provider]  sucralfate (CARAFATE) 1 GM/10ML suspension Take 10 mLs (1 g total) by mouth 4 (four) times daily -  with meals and at bedtime. 01/05/23   Marrion Coy, MD  tiotropium (SPIRIVA) 18 MCG inhalation capsule Place 18 mcg into inhaler and inhale daily.    [provider]  traZODone (DESYREL) 50 MG tablet Take 50 mg by mouth at bedtime.    [provider]    Family History  Problem Relation Age of Onset   Diabetes Other    Hypertension Other    Diabetes Mother    Heart failure Mother    Heart disease Mother    Heart attack Mother    Stroke Mother    Depression Mother    Hypertension Mother    Cancer Sister        brain   Hypertension Sister    Diabetes Brother    Hypertension Brother    Heart failure Sister    Heart attack Sister    SIDS Sister      Social History   Tobacco Use   Smoking status: Former    Current packs/day: 0.00    Average packs/day: 0.3 packs/day for 20.0 years (5.0 ttl pk-yrs)    Types: Cigarettes    Start date: 07/01/1986    Quit date: 07/01/2006    Years since quitting: 17.0   Smokeless tobacco: Never  Vaping Use   Vaping status: Never Used  Substance Use Topics   Alcohol use: No    Alcohol/week: 0.0 standard drinks of alcohol   Drug use: No    Allergies as of 07/28/2023 - Review Complete 07/28/2023  Allergen Reaction Noted   Aspirin Anaphylaxis, Shortness Of Breath, and Swelling 04/14/2015    Review of Systems:    All systems reviewed and negative except where noted in  HPI.   Physical Exam:  Vital signs in last 24 hours: Temp:  [98.4 F (36.9 C)-98.6 F (37 C)] 98.4 F (36.9 C) (04/02 0603) Pulse Rate:  [51-60] 55 (04/02 0930) Resp:  [11-32] 23 (04/02  0930) BP: (129-178)/(69-134) 172/80 (04/02 0930) SpO2:  [93 %-100 %] 96 % (04/02 0930) Weight:  [127 kg] 127 kg (04/01 2059)   General:   Pleasant, cooperative in NAD Head:  Normocephalic and atraumatic. Eyes:   No icterus.   Conjunctiva pink. PERRLA. Ears:  Normal auditory acuity. Neck:  Supple; no masses or thyroidomegaly Lungs: Respirations even and unlabored. Lungs clear to auscultation bilaterally.   No wheezes, crackles, or rhonchi.  Heart:  Regular rate and rhythm;  Without murmur, clicks, rubs or gallops Abdomen:  Soft, nondistended, nontender. Normal bowel sounds. No appreciable masses or hepatomegaly.  No rebound or guarding.  Rectal:  Not performed. Msk:  Symmetrical without gross deformities.   Extremities:  Without edema, cyanosis or clubbing. Neurologic:  Alert and oriented x3;  grossly normal neurologically. Skin:  Intact without significant lesions or rashes. Cervical Nodes:  No significant cervical adenopathy. Psych:  Alert and cooperative. Normal affect.  LAB RESULTS: Recent Labs    07/28/23 2103 07/29/23 0249  WBC 4.3  --   HGB 11.2* 10.8*  HCT 34.7*  --   PLT 228  --    BMET Recent Labs    07/28/23 2103  NA 137  K 3.6  CL 108  CO2 21*  GLUCOSE 145*  BUN 15  CREATININE 0.99  CALCIUM 9.0   LFT Recent Labs    07/28/23 2103  PROT 7.1  ALBUMIN 3.6  AST 18  ALT 16  ALKPHOS 137*  BILITOT 0.3   PT/INR No results for input(s): "LABPROT", "INR" in the last 72 hours.  STUDIES: DG Chest Port 1 View Result Date: 07/28/2023 CLINICAL DATA:  Worsening chest pain, short of breath, cough EXAM: PORTABLE CHEST 1 VIEW COMPARISON:  12/10/2022 FINDINGS: Single frontal view of the chest demonstrates stable enlargement of the cardiac silhouette. 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 Stable hiatal hernia. No airspace disease, effusion, or pneumothorax. No acute bony abnormalities. IMPRESSION: 1. No acute intrathoracic process.  Stable exam. Electronically Signed    By: Sharlet Salina M.D.   On: 07/28/2023 21:27      Impression / Plan:   Assessment: Principal Problem:   Melena Active Problems:   Morbid obesity (HCC)   Tina Hayes is a 78 y.o. y/o female with with a history of Cameron ulcers with a large hiatal hernia with GI bleeding in the past and multiple upper endoscopies.  The patient was also suggested to have abnormal esophageal motility on her last EGD.  She came in with nausea and vomiting and chronic black stools although she states that the black stools have gotten better including less black stools yesterday.  She has had no further bleeding or vomiting today and she has been NPO.  Plan:  The patient will be set up for an EGD for today.  The patient's chronic anemia and melena is likely due to her [name] ulcers.  The upper endoscopy should also let us know if there is a functional versus a structural cause for her nausea vomiting at the reason for her admission.  The patient has been explained the plan and agrees to the  Thank you for involving me in the care of this patient.      LOS: 0 days   Midge Minium, MD,  MD. Clementeen Graham 07/29/2023, 9:47 AM,  Pager [phone] 7am-5pm  Check AMION for 5pm -7am coverage and on weekends   Note: This dictation was prepared with Dragon dictation along with smaller phrase technology. Any transcriptional errors that result from this process are unintentional.

## 2023-07-29 NOTE — H&P (Signed)
 History and Physical    Patient: Tina Hayes ZOX:[id number] DOB: 01/04/1946 DOA: 07/29/2023 DOS: the patient was seen and examined on 07/29/2023 PCP: Gorden Harms, PA-C (Inactive)  Patient coming from: Home  Chief Complaint:  Chief Complaint  Patient presents with   Melena   Chest Pain    HPI: TABYTHA GRADILLAS is a 78 y.o. female with medical history significant for HFpEF, COPD, CAD, type 2 diabetes, seizure disorder, PAD on Plavix, hiatal hernia with history of [name] lesions,, hospitalized last September with upper GI bleed after presenting with hematemesis-EGD showing large hiatal hernia with no active bleeding, who presents to the ED with a 2-week history of on and off black tarry stool.  States a few days ago she had coffee-ground emesis and abdominal cramping.  Also has exertional dyspnea, chest pain and a cough.  Denies palpitations.  No vomiting or belly pain. ED course and data review: BP 166/89 with otherwise normal vitals Hemoglobin 11.2 which is better than her baseline around 9 Troponin 11 CMP unremarkable except for mildly elevated alk phos of 137. EKG, personally viewed and interpreted showing sinus bradycardia at 59 with nonspecific ST-T wave changes. Chest x-ray nonacute Patient given a fluid bolus, Zofran started on Protonix Hospitalist consulted for admission.   Review of Systems: As mentioned in the history of present illness. All other systems reviewed and are negative.  Past Medical History:  Diagnosis Date   (HFpEF) heart failure with preserved ejection fraction (HCC)    a. 05/2016 Echo: EF 60-65%, mild to mod LVH, Gr1 DD, mild MR, mildly dil LA, mod TR, mildly to mod increased PASP; b. 07/2020 Echo: EF 55-60%, no rwma, mild LVH, GrIDD, mildly dil LA, mild MR, mild-mod TR, mild-mod AoV sclerosis w/o stenosis.   Anxiety    Arthritis    Chronic back pain    COPD (chronic obstructive pulmonary disease) (HCC)    Coronary artery disease    a. s/p remote PCI x 5;   b. 2006 s/p CABG x 3 ([place], [place] [facility]); b. 05/2016 MV: attenuation corrected images w/o ischemia or wma-->Med rx; c. 06/2020 MV: EF 55-65%, no isch/infart-->low risk.   Depression    Diabetes mellitus without complication (HCC)    Essential hypertension 06/30/2016   GERD (gastroesophageal reflux disease)    Heart attack (HCC)    Total of 3 per pt.   Hyperlipidemia LDL goal <55    Hypertensive urgency 06/03/2015   PAD (peripheral artery disease) (HCC)    a. 06/2019 Angio: R AT (PTA), R POP (PTA & Viabahn stenting); b. 09/2020 ABIs: R 1.24, L 1.35.   Palpitations    a. 06/2020 Zio: Sinus brady, 54 (40-143), 4 beats NSVT, 3 SVT runs (max 143 bpm x 4 beats).   Seizure [facility])    Past Surgical History:  Procedure Laterality Date   ABDOMINAL HYSTERECTOMY     ANKLE SURGERY Right    CATARACT EXTRACTION W/ INTRAOCULAR LENS  IMPLANT, BILATERAL     COLONOSCOPY WITH PROPOFOL N/A 05/04/2017   Procedure: COLONOSCOPY WITH PROPOFOL;  Surgeon: Scot Jun, MD;  Location: [facility] ENDOSCOPY;  Service: Endoscopy;  Laterality: N/A;   CORONARY ANGIOPLASTY     CORONARY ARTERY BYPASS GRAFT     TRIPLE BYPASS   ESOPHAGOGASTRODUODENOSCOPY N/A 02/25/2021   Procedure: ESOPHAGOGASTRODUODENOSCOPY (EGD);  Surgeon: Toney Reil, MD;  Location: [facility] ENDOSCOPY;  Service: Gastroenterology;  Laterality: N/A;   ESOPHAGOGASTRODUODENOSCOPY (EGD) WITH PROPOFOL N/A 05/04/2017   Procedure: ESOPHAGOGASTRODUODENOSCOPY (EGD) WITH  PROPOFOL;  Surgeon: Scot Jun, MD;  Location: [facility] ENDOSCOPY;  Service: Endoscopy;  Laterality: N/A;   ESOPHAGOGASTRODUODENOSCOPY (EGD) WITH PROPOFOL N/A 01/05/2023   Procedure: ESOPHAGOGASTRODUODENOSCOPY (EGD) WITH PROPOFOL;  Surgeon: Wyline Mood, MD;  Location: [facility] ENDOSCOPY;  Service: Gastroenterology;  Laterality: N/A;   EXCISION MASS LOWER EXTREMETIES Right 03/02/2018   Procedure: EXCISION SOFT TISSUE MASS FROM MEDIAL ASPECT OF RIGHT ANKLE;  Surgeon: Christena Flake, MD;   Location: [facility] ORS;  Service: Orthopedics;  Laterality: Right;   FRACTURE SURGERY     JOINT REPLACEMENT     right   KNEE SURGERY Right    LOWER EXTREMITY ANGIOGRAPHY Right 07/14/2019   Procedure: LOWER EXTREMITY ANGIOGRAPHY;  Surgeon: Annice Needy, MD;  Location: [facility] INVASIVE CV LAB;  Service: Cardiovascular;  Laterality: Right;   MOUTH SURGERY Left 07/14/2017   TOTAL KNEE ARTHROPLASTY Left 09/27/2019   Procedure: TOTAL KNEE ARTHROPLASTY;  Surgeon: Christena Flake, MD;  Location: [facility] ORS;  Service: Orthopedics;  Laterality: Left;   Social History:  reports that she quit smoking about 17 years ago. Her smoking use included cigarettes. She started smoking about 37 years ago. She has a 5 pack-year smoking history. She has never used smokeless tobacco. She reports that she does not drink alcohol and does not use drugs.  Allergies  Allergen Reactions   Aspirin Anaphylaxis, Shortness Of Breath and Swelling    LIPS AND THROAT SWELL, DIFFICULT TO BREATH    Family History  Problem Relation Age of Onset   Diabetes Other    Hypertension Other    Diabetes Mother    Heart failure Mother    Heart disease Mother    Heart attack Mother    Stroke Mother    Depression Mother    Hypertension Mother    Cancer Sister        brain   Hypertension Sister    Diabetes Brother    Hypertension Brother    Heart failure Sister    Heart attack Sister    SIDS Sister     Prior to Admission medications   Medication Sig Start Date End Date Taking? Authorizing Provider  acetaminophen (TYLENOL) 500 MG tablet Take 500 mg by mouth every 6 (six) hours as needed for headache, moderate pain or fever.    [provider]  albuterol (PROVENTIL HFA;VENTOLIN HFA) 108 (90 Base) MCG/ACT inhaler Inhale 2 puffs into the lungs every 6 (six) hours as needed for wheezing or shortness of breath. 08/30/15   Katha Hamming, MD  albuterol (PROVENTIL) (2.5 MG/3ML) 0.083% nebulizer solution Take 2.5 mg every 4 (four)  hours as needed by nebulization for wheezing or shortness of breath.    [provider]  amitriptyline (ELAVIL) 25 MG tablet Take 25 mg by mouth at bedtime. 01/20/22   [provider]  amLODipine (NORVASC) 5 MG tablet Take 1 tablet by mouth once daily 09/23/22   Antonieta Iba, MD  atorvastatin (LIPITOR) 40 MG tablet Take 1 tablet (40 mg total) by mouth daily. 02/04/22   Antonieta Iba, MD  B-D UF III MINI PEN NEEDLES 31G X 5 MM MISC USE WITH INSULIN PEN INJECTIONS TWICE DAILY 08/05/18   [provider]  carvedilol (COREG) 3.125 MG tablet Take 1 tablet (3.125 mg total) by mouth 2 (two) times daily with a meal. 03/20/23   Furth, Cadence H, PA-C  clopidogrel (PLAVIX) 75 MG tablet Take 75 mg by mouth daily.    [provider]  divalproex (DEPAKOTE) 250  MG DR tablet Take 250 mg by mouth 2 (two) times daily. 07/28/22   [provider]  DUPIXENT 300 MG/2ML SOPN Inject into the skin. 12/30/21   [provider]  Ferrous Sulfate (IRON) 325 (65 Fe) MG TABS Take 1 tablet by mouth daily. 09/01/22   [provider]  HYDROcodone-acetaminophen (NORCO) 7.5-325 MG tablet Take 1 tablet by mouth every 6 (six) hours as needed for moderate pain (pain score 4-6) or severe pain (pain score 7-10). 07/06/23 08/05/23  Yevette Edwards, MD  insulin aspart (NOVOLOG) 100 UNIT/ML FlexPen Inject 8 units subQ if blood sugar is consistently over 300 for more than 6 hours despite taking your other prescribed insulin. Repeat up to three times a day (every 8 hours) until blood sugars improve. 12/10/22   Shaune Pollack, MD  LANTUS SOLOSTAR 100 UNIT/ML Solostar Pen Inject 10 Units into the skin See admin instructions. Inject 30 units subcutaneously in the morning at 35 units subcutaneously at bedtime. 01/05/23   Marrion Coy, MD  levETIRAcetam (KEPPRA) 750 MG tablet Take 1,500 mg by mouth 2 (two) times daily.    [provider]  losartan (COZAAR) 100 MG tablet Take 1 tablet by  mouth once daily 04/03/23   Antonieta Iba, MD  montelukast (SINGULAIR) 10 MG tablet Take 10 mg by mouth daily. 02/02/22   [provider]  naloxone Jonelle Sports) nasal spray 4 mg/0.1 mL As directed for opioid induced respiratory depression 02/03/23   Yevette Edwards, MD  nitroGLYCERIN (NITROSTAT) 0.4 MG SL tablet DISSOLVE ONE TABLET UNDER THE TONGUE EVERY 5 MINUTES AS NEEDED FOR CHEST PAIN.  DO NOT EXCEED A TOTAL OF 3 DOSES IN 15 MINUTES 02/04/22   Gollan, Tollie Pizza, MD  omeprazole (PRILOSEC) 40 MG capsule TAKE 1 CAPSULE BY MOUTH TWICE DAILY BEFORE A MEAL 04/28/23   Vanga, Loel Dubonnet, MD  pantoprazole (PROTONIX) 40 MG tablet Take 40 mg by mouth 2 (two) times daily. 04/27/23   [provider]  sertraline (ZOLOFT) 100 MG tablet Take 150 mg by mouth daily. 11/27/21   [provider]  sucralfate (CARAFATE) 1 GM/10ML suspension Take 10 mLs (1 g total) by mouth 4 (four) times daily -  with meals and at bedtime. 01/05/23   Marrion Coy, MD  tiotropium (SPIRIVA) 18 MCG inhalation capsule Place 18 mcg into inhaler and inhale daily.    [provider]  traZODone (DESYREL) 50 MG tablet Take 50 mg by mouth at bedtime.    [provider]    Physical Exam: Vitals:   07/28/23 2056 07/28/23 2059 07/29/23 0145  BP: (!) 166/89  (!) 178/79  Pulse: 60  (!) 59  Resp: 17  (!) 25  Temp: 98.5 F (36.9 C)  98.6 F (37 C)  TempSrc: Oral  Oral  SpO2: 97%  96%  Weight:  127 kg   Height:  5\' 11"  (1.803 m)    Physical Exam Vitals and nursing note reviewed.  Constitutional:      General: She is not in acute distress. HENT:     Head: Normocephalic and atraumatic.  Cardiovascular:     Rate and Rhythm: Normal rate and regular rhythm.     Heart sounds: Normal heart sounds.  Pulmonary:     Effort: Pulmonary effort is normal.     Breath sounds: Normal breath sounds.  Abdominal:     Palpations: Abdomen is soft.     Tenderness: There is no abdominal tenderness.  Neurological:      Mental  Status: Mental status is at baseline.     Labs on Admission: I have personally reviewed following labs and imaging studies  CBC: Recent Labs  Lab 07/28/23 2103  WBC 4.3  HGB 11.2*  HCT 34.7*  MCV 84.6  PLT 228   Basic Metabolic Panel: Recent Labs  Lab 07/28/23 2103  NA 137  K 3.6  CL 108  CO2 21*  GLUCOSE 145*  BUN 15  CREATININE 0.99  CALCIUM 9.0   GFR: Estimated Creatinine Clearance: 72.3 mL/min (by C-G formula based on SCr of 0.99 mg/dL). Liver Function Tests: Recent Labs  Lab 07/28/23 2103  AST 18  ALT 16  ALKPHOS 137*  BILITOT 0.3  PROT 7.1  ALBUMIN 3.6   No results for input(s): "LIPASE", "AMYLASE" in the last 168 hours. No results for input(s): "AMMONIA" in the last 168 hours. Coagulation Profile: No results for input(s): "INR", "PROTIME" in the last 168 hours. Cardiac Enzymes: No results for input(s): "CKTOTAL", "CKMB", "CKMBINDEX", "TROPONINI" in the last 168 hours. BNP (last 3 results) No results for input(s): "PROBNP" in the last 8760 hours. HbA1C: No results for input(s): "HGBA1C" in the last 72 hours. CBG: No results for input(s): "GLUCAP" in the last 168 hours. Lipid Profile: No results for input(s): "CHOL", "HDL", "LDLCALC", "TRIG", "CHOLHDL", "LDLDIRECT" in the last 72 hours. Thyroid Function Tests: No results for input(s): "TSH", "T4TOTAL", "FREET4", "T3FREE", "THYROIDAB" in the last 72 hours. Anemia Panel: No results for input(s): "VITAMINB12", "FOLATE", "FERRITIN", "TIBC", "IRON", "RETICCTPCT" in the last 72 hours. Urine analysis:    Component Value Date/Time   COLORURINE YELLOW (A) 04/10/2022 1935   APPEARANCEUR TURBID (A) 04/10/2022 1935   LABSPEC 1.010 04/10/2022 1935   PHURINE 7.0 04/10/2022 1935   GLUCOSEU NEGATIVE 04/10/2022 1935   HGBUR NEGATIVE 04/10/2022 1935   BILIRUBINUR NEGATIVE 04/10/2022 1935   KETONESUR NEGATIVE 04/10/2022 1935   PROTEINUR 100 (A) 04/10/2022 1935   NITRITE POSITIVE (A) 04/10/2022  1935   LEUKOCYTESUR LARGE (A) 04/10/2022 1935    Radiological Exams on Admission: DG Chest Port 1 View Result Date: 07/28/2023 CLINICAL DATA:  Worsening chest pain, short of breath, cough EXAM: PORTABLE CHEST 1 VIEW COMPARISON:  12/10/2022 FINDINGS: Single frontal view of the chest demonstrates stable enlargement of the cardiac silhouette. 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 Stable hiatal hernia. No airspace disease, effusion, or pneumothorax. No acute bony abnormalities. IMPRESSION: 1. No acute intrathoracic process.  Stable exam. Electronically Signed   By: Sharlet Salina M.D.   On: 07/28/2023 21:27     Data Reviewed: Relevant notes from primary care and specialist visits, past discharge summaries as available in EHR, including Care Everywhere. Prior diagnostic testing as pertinent to current admission diagnoses Updated medications and problem lists for reconciliation ED course, including vitals, labs, imaging, treatment and response to treatment Triage notes, nursing and pharmacy notes and ED provider's notes Notable results as noted in HPI   Assessment and Plan:   Melena/GIB (upper gastrointestinal bleed) History of Cameron ulcer with hiatal hernia on EGD. History of upper GI bleed Serial H&H and transfuse if needed N.p.o. for possible scope in the morning GI consulted Continue Protonix Serial H&H     Coronary artery disease No active CP  Cont home meds     Chronic (HFpEF) heart failure with preserved ejection fraction [facility]) 2D echo April 2022 with EF of 55 to 60% grade 1 diastolic dysfunction No exacerbation.   Type 2 diabetes mellitus with hyperglycemia and hypoglycemia. Sliding scale insulin coverage  Occlusion and stenosis of vertebral artery Holding Plavix   Seizures (HCC) Continue home meds with small sips of water   Essential hypertension Hydralazine as needed while n.p.o.   COPD (chronic obstructive pulmonary disease) (HCC) No  exacerbation. Continue home inhalers.  DuoNebs as needed    DVT prophylaxis: SCD D  Consults: GI  Advance Care Planning:   Code Status: Prior   Family Communication: none  Disposition Plan: Back to previous home environment  Severity of Illness: The appropriate patient status for this patient is INPATIENT. Inpatient status is judged to be reasonable and necessary in order to provide the required intensity of service to ensure the patient's safety. The patient's presenting symptoms, physical exam findings, and initial radiographic and laboratory data in the context of their chronic comorbidities is felt to place them at high risk for further clinical deterioration. Furthermore, it is not anticipated that the patient will be medically stable for discharge from the hospital within 2 midnights of admission.   * I certify that at the point of admission it is my clinical judgment that the patient will require inpatient hospital care spanning beyond 2 midnights from the point of admission due to high intensity of service, high risk for further deterioration and high frequency of surveillance required.*  Author: Andris Baumann, MD 07/29/2023 2:26 AM  For on call review [email or web address].

## 2023-07-29 NOTE — Plan of Care (Signed)
 Patient was seen and examined at bedside after EGD, tolerated procedure well.  Patient was admitted last night due to melena, black stools.  Hemoglobin 10. 8 in the morning.  Seen by GI, status post EGD did not find any active bleeding but patient has Tina Hayes lesions secondary to hiatal hernia.  Recommended follow-up with general surgery as an outpatient.  No further workup needed. Patient is homeless, received anesthesia and having chronic backache.  We will continue to monitor today and plan for discharge tomorrow a.m. TOC consulted to provide resources.

## 2023-07-29 NOTE — Transfer of Care (Signed)
 Immediate Anesthesia Transfer of Care Note  Patient: Tina Hayes  Procedure(s) Performed: EGD (ESOPHAGOGASTRODUODENOSCOPY)  Patient Location: PACU  Anesthesia Type:MAC  Level of Consciousness: sedated  Airway & Oxygen Therapy: Patient Spontanous Breathing and Patient connected to face mask oxygen  Post-op Assessment: Report given to RN and Post -op Vital signs reviewed and stable  Post vital signs: stable  Last Vitals:  Vitals Value Taken Time  BP 135/72 07/29/23 1141  Temp 36.4 C 07/29/23 1139  Pulse 55 07/29/23 1143  Resp 14 07/29/23 1143  SpO2 100 % 07/29/23 1143  Vitals shown include unfiled device data.  Last Pain:  Vitals:   07/29/23 1139  TempSrc: Temporal  PainSc: Asleep         Complications: No notable events documented.

## 2023-07-29 NOTE — Anesthesia Preprocedure Evaluation (Addendum)
 Anesthesia Evaluation  Patient identified by MRN, date of birth, ID band Patient awake  General Assessment Comment:  Patient with melena and anemia. Nausea vomiting yesterday, but none today.  Reviewed: Allergy & Precautions, NPO status , Patient's Chart, lab work & pertinent test results  History of Anesthesia Complications Negative for: history of anesthetic complications  Airway Mallampati: II  TM Distance: >3 FB Neck ROM: Full    Dental  (+) Edentulous Upper, Edentulous Lower   Pulmonary neg sleep apnea, COPD, Patient abstained from smoking.Not current smoker, former smoker   breath sounds clear to auscultation + decreased breath sounds      Cardiovascular Exercise Tolerance: Good METShypertension, + CAD, + Past MI, + Peripheral Vascular Disease and +CHF  (-) dysrhythmias + Valvular Problems/Murmurs MR  Rhythm:Regular Rate:Normal + Systolic murmurs 1. Left ventricular ejection fraction, by estimation, is 55 to 60%. The  left ventricle has normal function. The left ventricle has no regional  wall motion abnormalities. Left ventricular diastolic parameters are  consistent with Grade I diastolic  dysfunction (impaired relaxation).   2. Right ventricular systolic function is normal. The right ventricular  size is mildly enlarged.   3. Left atrial size was severely dilated.   4. The mitral valve is grossly normal. Mild to moderate mitral valve  regurgitation.   5. Tricuspid valve regurgitation is moderate.   6. The aortic valve is tricuspid. Aortic valve regurgitation is not  visualized. Aortic valve sclerosis is present, with no evidence of aortic  valve stenosis.   7. The inferior vena cava is normal in size with greater than 50%  respiratory variability, suggesting right atrial pressure of 3 mmHg.     Neuro/Psych  Headaches, Seizures -,  PSYCHIATRIC DISORDERS Anxiety Depression       GI/Hepatic hiatal hernia, PUD,GERD   ,,(+)     (-) substance abuse    Endo/Other  diabetes    Renal/GU negative Renal ROS     Musculoskeletal   Abdominal  (+) + obese  Peds  Hematology   Anesthesia Other Findings Past Medical History: No date: (HFpEF) heart failure with preserved ejection fraction (HCC)     Comment:  a. 05/2016 Echo: EF 60-65%, mild to mod LVH, Gr1 DD, mild              MR, mildly dil LA, mod TR, mildly to mod increased PASP;               b. 07/2020 Echo: EF 55-60%, no rwma, mild LVH, GrIDD,               mildly dil LA, mild MR, mild-mod TR, mild-mod AoV               sclerosis w/o stenosis. No date: Anxiety No date: Arthritis No date: Chronic back pain No date: COPD (chronic obstructive pulmonary disease) (HCC) No date: Coronary artery disease     Comment:  a. s/p remote PCI x 5;  b. 2006 s/p CABG x 3               ([place], [place] [facility] b. 05/2016               MV: attenuation corrected images w/o ischemia or               wma-->Med rx; c. 06/2020 MV: EF 55-65%, no               isch/infart-->low risk. No date: Depression No  date: Diabetes mellitus without complication (HCC) 06/30/2016: Essential hypertension No date: GERD (gastroesophageal reflux disease) No date: Heart attack [facility])     Comment:  Total of 3 per pt. No date: Hyperlipidemia LDL goal <55 06/03/2015: Hypertensive urgency No date: PAD (peripheral artery disease) (HCC)     Comment:  a. 06/2019 Angio: R AT (PTA), R POP (PTA & Viabahn               stenting); b. 09/2020 ABIs: R 1.24, L 1.35. No date: Palpitations     Comment:  a. 06/2020 Zio: Sinus brady, 54 (40-143), 4 beats NSVT, 3              SVT runs (max 143 bpm x 4 beats). No date: Seizure [facility])  Reproductive/Obstetrics                             Anesthesia Physical Anesthesia Plan  ASA: 3  Anesthesia Plan: General   Post-op Pain Management: Minimal or no pain anticipated   Induction: Intravenous  PONV Risk Score  and Plan: 3 and Propofol infusion, TIVA and Ondansetron  Airway Management Planned: Nasal Cannula  Additional Equipment: None  Intra-op Plan:   Post-operative Plan:   Informed Consent: I have reviewed the patients History and Physical, chart, labs and discussed the procedure including the risks, benefits and alternatives for the proposed anesthesia with the patient or authorized representative who has indicated his/her understanding and acceptance.     Dental advisory given  Plan Discussed with: CRNA and Surgeon  Anesthesia Plan Comments: (Discussed risks of anesthesia with patient, including possibility of difficulty with spontaneous ventilation under anesthesia necessitating airway intervention, PONV, and rare risks such as cardiac or respiratory or neurological events, and allergic reactions. Discussed the role of CRNA in patient's perioperative care. Patient understands.)       Anesthesia Quick Evaluation

## 2023-07-29 NOTE — Progress Notes (Signed)
   07/29/23 2045  Spiritual Encounters  Type of Visit Initial  Care provided to: Patient  Referral source Physician  Reason for visit Urgent spiritual support (Pt requested prayer)  OnCall Visit Yes  Spiritual Framework  Presenting Themes Values and beliefs;Meaning/purpose/sources of inspiration  Interventions  Spiritual Care Interventions Made Established relationship of care and support;Compassionate presence;Prayer  Intervention Outcomes  Outcomes Connection to spiritual care;Other (comment);Awareness of support (Pt went to sleep during the prayer; Chaplian tip-toed out of the room.)  Spiritual Care Plan  Spiritual Care Issues Still Outstanding Referring to oncoming chaplain for further support

## 2023-07-29 NOTE — ED Provider Notes (Signed)
 [facility] Provider Note    Event Date/Time   First MD Initiated Contact with Patient 07/29/23 [phone]     (approximate)   History   Melena and Chest Pain   HPI  Tina Hayes is a 78 y.o. female sent to the ED by her PCP with a chief complaint of black tarry stools x 2 weeks, generalized weakness, dyspnea on exertion and chest tightness.  Also endorses dry cough for 2 to 3 days.  Has been exposed to the flu.  Denies fever/chills, dysuria or diarrhea.  Patient does take Plavix.  Denies EtOH or NSAID use.     Past Medical History   Past Medical History:  Diagnosis Date   (HFpEF) heart failure with preserved ejection fraction (HCC)    a. 05/2016 Echo: EF 60-65%, mild to mod LVH, Gr1 DD, mild MR, mildly dil LA, mod TR, mildly to mod increased PASP; b. 07/2020 Echo: EF 55-60%, no rwma, mild LVH, GrIDD, mildly dil LA, mild MR, mild-mod TR, mild-mod AoV sclerosis w/o stenosis.   Anxiety    Arthritis    Chronic back pain    COPD (chronic obstructive pulmonary disease) (HCC)    Coronary artery disease    a. s/p remote PCI x 5;  b. 2006 s/p CABG x 3 ([place], [place] [facility]); b. 05/2016 MV: attenuation corrected images w/o ischemia or wma-->Med rx; c. 06/2020 MV: EF 55-65%, no isch/infart-->low risk.   Depression    Diabetes mellitus without complication (HCC)    Essential hypertension 06/30/2016   GERD (gastroesophageal reflux disease)    Heart attack (HCC)    Total of 3 per pt.   Hyperlipidemia LDL goal <55    Hypertensive urgency 06/03/2015   PAD (peripheral artery disease) (HCC)    a. 06/2019 Angio: R AT (PTA), R POP (PTA & Viabahn stenting); b. 09/2020 ABIs: R 1.24, L 1.35.   Palpitations    a. 06/2020 Zio: Sinus brady, 54 (40-143), 4 beats NSVT, 3 SVT runs (max 143 bpm x 4 beats).   Seizure [facility])      Active Problem List   Patient Active Problem List   Diagnosis Date Noted   Choking 01/04/2023   Upper GI bleed 01/02/2023   (HFpEF)  heart failure with preserved ejection fraction (HCC) 01/02/2023   Other chest pain 04/11/2022   Generalized weakness 04/10/2022   Acute lower UTI 04/10/2022   Chest pain 04/10/2022   Dyslipidemia 04/10/2022   Chronic obstructive pulmonary disease (COPD) (HCC) 04/10/2022   Type 2 diabetes mellitus without complications (HCC) 04/10/2022   GERD without esophagitis 04/10/2022   Headache 08/09/2021   Melena    Coffee ground emesis    Paraesophageal hernia    Cameron ulcer, acute    Acute upper GI bleed 02/24/2021   Abnormal EKG 02/24/2021   Acute on chronic anemia 02/24/2021   Status post total knee replacement using cement, 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   Atherosclerosis of native arteries of the extremities with ulceration (HCC) 07/05/2019   Onychomycosis 05/30/2019   GERD (gastroesophageal reflux disease) 12/14/2018   Presbyesophagus 12/14/2018   Occlusion and stenosis of vertebral artery 08/31/2018   AKI (acute kidney injury) (HCC) 05/25/2018   BMI 50.0-59.9, adult (HCC) 04/16/2018   Seizures (HCC) 11/10/2017   Mass of right lower leg 09/11/2017   Dysphagia 04/23/2017   Chronic diastolic congestive heart failure (HCC) 02/13/2017   Acute diastolic heart failure (HCC) 01/27/2017   Depression 12/11/2016   Essential hypertension 06/30/2016  Snoring 06/30/2016   Diabetes mellitus (HCC) 06/30/2016   Coronary artery disease 06/17/2016   Morbid obesity (HCC) 06/17/2016   Angina pectoris (HCC) 06/16/2016   COPD (chronic obstructive pulmonary disease) (HCC) 08/28/2015   COPD with acute exacerbation (HCC) 04/14/2015     Past Surgical History   Past Surgical History:  Procedure Laterality Date   ABDOMINAL HYSTERECTOMY     ANKLE SURGERY Right    CATARACT EXTRACTION W/ INTRAOCULAR LENS  IMPLANT, BILATERAL     COLONOSCOPY WITH PROPOFOL N/A 05/04/2017   Procedure: COLONOSCOPY WITH PROPOFOL;  Surgeon: Scot Jun, MD;  Location: [facility] ENDOSCOPY;  Service: Endoscopy;  Laterality: N/A;    CORONARY ANGIOPLASTY     CORONARY ARTERY BYPASS GRAFT     TRIPLE BYPASS   ESOPHAGOGASTRODUODENOSCOPY N/A 02/25/2021   Procedure: ESOPHAGOGASTRODUODENOSCOPY (EGD);  Surgeon: Toney Reil, MD;  Location: [facility] ENDOSCOPY;  Service: Gastroenterology;  Laterality: N/A;   ESOPHAGOGASTRODUODENOSCOPY (EGD) WITH PROPOFOL N/A 05/04/2017   Procedure: ESOPHAGOGASTRODUODENOSCOPY (EGD) WITH PROPOFOL;  Surgeon: Scot Jun, MD;  Location: [facility] ENDOSCOPY;  Service: Endoscopy;  Laterality: N/A;   ESOPHAGOGASTRODUODENOSCOPY (EGD) WITH PROPOFOL N/A 01/05/2023   Procedure: ESOPHAGOGASTRODUODENOSCOPY (EGD) WITH PROPOFOL;  Surgeon: Wyline Mood, MD;  Location: [facility] ENDOSCOPY;  Service: Gastroenterology;  Laterality: N/A;   EXCISION MASS LOWER EXTREMETIES Right 03/02/2018   Procedure: EXCISION SOFT TISSUE MASS FROM MEDIAL ASPECT OF RIGHT ANKLE;  Surgeon: Christena Flake, MD;  Location: [facility] ORS;  Service: Orthopedics;  Laterality: Right;   FRACTURE SURGERY     JOINT REPLACEMENT     right   KNEE SURGERY Right    LOWER EXTREMITY ANGIOGRAPHY Right 07/14/2019   Procedure: LOWER EXTREMITY ANGIOGRAPHY;  Surgeon: Annice Needy, MD;  Location: [facility] INVASIVE CV LAB;  Service: Cardiovascular;  Laterality: Right;   MOUTH SURGERY Left 07/14/2017   TOTAL KNEE ARTHROPLASTY Left 09/27/2019   Procedure: TOTAL KNEE ARTHROPLASTY;  Surgeon: Christena Flake, MD;  Location: [facility] ORS;  Service: Orthopedics;  Laterality: Left;     Home Medications   Prior to Admission medications   Medication Sig Start Date End Date Taking? Authorizing Provider  acetaminophen (TYLENOL) 500 MG tablet Take 500 mg by mouth every 6 (six) hours as needed for headache, moderate pain or fever.    [provider]  albuterol (PROVENTIL HFA;VENTOLIN HFA) 108 (90 Base) MCG/ACT inhaler Inhale 2 puffs into the lungs every 6 (six) hours as needed for wheezing or shortness of breath. 08/30/15   Katha Hamming, MD  albuterol (PROVENTIL) (2.5 MG/3ML)  0.083% nebulizer solution Take 2.5 mg every 4 (four) hours as needed by nebulization for wheezing or shortness of breath.    [provider]  amitriptyline (ELAVIL) 25 MG tablet Take 25 mg by mouth at bedtime. 01/20/22   [provider]  amLODipine (NORVASC) 5 MG tablet Take 1 tablet by mouth once daily 09/23/22   Antonieta Iba, MD  atorvastatin (LIPITOR) 40 MG tablet Take 1 tablet (40 mg total) by mouth daily. 02/04/22   Antonieta Iba, MD  B-D UF III MINI PEN NEEDLES 31G X 5 MM MISC USE WITH INSULIN PEN INJECTIONS TWICE DAILY 08/05/18   [provider]  carvedilol (COREG) 3.125 MG tablet Take 1 tablet (3.125 mg total) by mouth 2 (two) times daily with a meal. 03/20/23   Furth, Cadence H, PA-C  clopidogrel (PLAVIX) 75 MG tablet Take 75 mg by mouth daily.    [provider]  divalproex (DEPAKOTE) 250 MG DR tablet Take  250 mg by mouth 2 (two) times daily. 07/28/22   [provider]  DUPIXENT 300 MG/2ML SOPN Inject into the skin. 12/30/21   [provider]  Ferrous Sulfate (IRON) 325 (65 Fe) MG TABS Take 1 tablet by mouth daily. 09/01/22   [provider]  HYDROcodone-acetaminophen (NORCO) 7.5-325 MG tablet Take 1 tablet by mouth every 6 (six) hours as needed for moderate pain (pain score 4-6) or severe pain (pain score 7-10). 07/06/23 08/05/23  Yevette Edwards, MD  insulin aspart (NOVOLOG) 100 UNIT/ML FlexPen Inject 8 units subQ if blood sugar is consistently over 300 for more than 6 hours despite taking your other prescribed insulin. Repeat up to three times a day (every 8 hours) until blood sugars improve. 12/10/22   Shaune Pollack, MD  LANTUS SOLOSTAR 100 UNIT/ML Solostar Pen Inject 10 Units into the skin See admin instructions. Inject 30 units subcutaneously in the morning at 35 units subcutaneously at bedtime. 01/05/23   Marrion Coy, MD  levETIRAcetam (KEPPRA) 750 MG tablet Take 1,500 mg by mouth 2 (two) times daily.    [provider]   losartan (COZAAR) 100 MG tablet Take 1 tablet by mouth once daily 04/03/23   Antonieta Iba, MD  montelukast (SINGULAIR) 10 MG tablet Take 10 mg by mouth daily. 02/02/22   [provider]  naloxone Jonelle Sports) nasal spray 4 mg/0.1 mL As directed for opioid induced respiratory depression 02/03/23   Yevette Edwards, MD  nitroGLYCERIN (NITROSTAT) 0.4 MG SL tablet DISSOLVE ONE TABLET UNDER THE TONGUE EVERY 5 MINUTES AS NEEDED FOR CHEST PAIN.  DO NOT EXCEED A TOTAL OF 3 DOSES IN 15 MINUTES 02/04/22   Gollan, Tollie Pizza, MD  omeprazole (PRILOSEC) 40 MG capsule TAKE 1 CAPSULE BY MOUTH TWICE DAILY BEFORE A MEAL 04/28/23   Vanga, Loel Dubonnet, MD  pantoprazole (PROTONIX) 40 MG tablet Take 40 mg by mouth 2 (two) times daily. 04/27/23   [provider]  sertraline (ZOLOFT) 100 MG tablet Take 150 mg by mouth daily. 11/27/21   [provider]  sucralfate (CARAFATE) 1 GM/10ML suspension Take 10 mLs (1 g total) by mouth 4 (four) times daily -  with meals and at bedtime. 01/05/23   Marrion Coy, MD  tiotropium (SPIRIVA) 18 MCG inhalation capsule Place 18 mcg into inhaler and inhale daily.    [provider]  traZODone (DESYREL) 50 MG tablet Take 50 mg by mouth at bedtime.    [provider]     Allergies  Aspirin   Family History   Family History  Problem Relation Age of Onset   Diabetes Other    Hypertension Other    Diabetes Mother    Heart failure Mother    Heart disease Mother    Heart attack Mother    Stroke Mother    Depression Mother    Hypertension Mother    Cancer Sister        brain   Hypertension Sister    Diabetes Brother    Hypertension Brother    Heart failure Sister    Heart attack Sister    SIDS Sister      Physical Exam  Triage Vital Signs: ED Triage Vitals  Encounter Vitals Group     BP 07/28/23 2056 (!) 166/89     Systolic BP Percentile --      Diastolic BP Percentile --      Pulse Rate 07/28/23 2056 60     Resp 07/28/23 2056  17  Temp 07/28/23 2056 98.5 F (36.9 C)     Temp Source 07/28/23 2056 Oral     SpO2 07/28/23 2056 97 %     Weight 07/28/23 2059 280 lb (127 kg)     Height 07/28/23 2059 5\' 11"  (1.803 m)     Head Circumference --      Peak Flow --      Pain Score 07/28/23 2059 6     Pain Loc --      Pain Education --      Exclude from Growth Chart --     Updated Vital Signs: BP (!) 166/89   Pulse 60   Temp 98.5 F (36.9 C) (Oral)   Resp 17   Ht 5\' 11"  (1.803 m)   Wt 127 kg   SpO2 97%   BMI 39.05 kg/m    General: Awake, mild distress.  CV:  RRR.  Good peripheral perfusion.  Resp:  Normal effort.  CTAB. Abd:  Obese, mild tenderness to epigastrium without rebound or guarding.  No distention.  Other:  No truncal vesicles.   ED Results / Procedures / Treatments  Labs (all labs ordered are listed, but only abnormal results are displayed) Labs Reviewed  CBC - Abnormal; Notable for the following components:      Result Value   Hemoglobin 11.2 (*)    HCT 34.7 (*)    All other components within normal limits  COMPREHENSIVE METABOLIC PANEL WITH GFR - Abnormal; Notable for the following components:   CO2 21 (*)    Glucose, Bld 145 (*)    Alkaline Phosphatase 137 (*)    GFR, Estimated 59 (*)    All other components within normal limits  POC OCCULT BLOOD, ED  TYPE AND SCREEN  TROPONIN I (HIGH SENSITIVITY)  TROPONIN I (HIGH SENSITIVITY)     EKG  ED ECG REPORT I, Dolores Ewing J, the attending physician, personally viewed and interpreted this ECG.   Date: 07/29/2023  EKG Time: 2101  Rate: 59  Rhythm: sinus bradycardia  Axis: Normal  Intervals:none  ST&T Change: Nonspecific    RADIOLOGY I have independently visualized and interpreted patient's imaging study as well as noted the radiology interpretation:  Chest x-ray: No acute cardiopulmonary process  Official radiology report(s): DG Chest Port 1 View Result Date: 07/28/2023 CLINICAL DATA:  Worsening chest pain, short of  breath, cough EXAM: PORTABLE CHEST 1 VIEW COMPARISON:  12/10/2022 FINDINGS: Single frontal view of the chest demonstrates stable enlargement of the cardiac silhouette. 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 Stable hiatal hernia. No airspace disease, effusion, or pneumothorax. No acute bony abnormalities. IMPRESSION: 1. No acute intrathoracic process.  Stable exam. Electronically Signed   By: Sharlet Salina M.D.   On: 07/28/2023 21:27     PROCEDURES:  Critical Care performed: No  .1-3 Lead EKG Interpretation  Performed by: Irean Hong, MD Authorized by: Irean Hong, MD     Interpretation: normal     ECG rate:  60   ECG rate assessment: normal     Rhythm: sinus rhythm     Ectopy: none     Conduction: normal   Comments:     Patient placed on cardiac monitor to evaluate for arrhythmias  Rectal exam: Melanotic stool obtained on gloved finger which is immediately guaiac positive.  MEDICATIONS ORDERED IN ED: Medications  pantoprazole (PROTONIX) injection 40 mg (has no administration in time range)  ondansetron (ZOFRAN) injection 4 mg (has no administration in time range)  sodium chloride  0.9 % bolus 500 mL (has no administration in time range)     IMPRESSION / MDM / ASSESSMENT AND PLAN / ED COURSE  I reviewed the triage vital signs and the nursing notes.                             78 year old female who sent by her PCP for melena, chest pain and shortness of breath. Differential diagnosis includes, but is not limited to, biliary disease (biliary colic, acute cholecystitis, cholangitis, choledocholithiasis, etc), intrathoracic causes for epigastric abdominal pain including ACS, gastritis, duodenitis, pancreatitis, small bowel or large bowel obstruction, abdominal aortic aneurysm, hernia, and ulcer(s).  I have personally noted patient's records and note EGD report from 01/05/2023 done for coffee-ground emesis which found large hiatal hernia, esophageal dysmotility.  Patient's presentation  is most consistent with acute complicated illness / injury requiring diagnostic workup.  The patient is on the cardiac monitor to evaluate for evidence of arrhythmia and/or significant heart rate changes.  Laboratory results demonstrate stable hemoglobin 11.2, patient hemodynamically stable.  Starts IV fluids, Protonix, Zofran.  Will consult hospitalist services for evaluation and admission.      FINAL CLINICAL IMPRESSION(S) / ED DIAGNOSES   Final diagnoses:  Nonspecific chest pain  Melena  Epigastric pain     Rx / DC Orders   ED Discharge Orders     None        Note:  This document was prepared using Dragon voice recognition software and may include unintentional dictation errors.   Irean Hong, MD 07/29/23 [phone]

## 2023-07-29 NOTE — Op Note (Addendum)
 [facility] Gastroenterology Patient Name: Tina Hayes Procedure Date: 07/29/2023 10:35 AM MRN: [id number] Account #: [id number] Date of Birth: [birth year] Admit Type: Inpatient Age: 78 Room: [facility] ENDO ROOM 1 Gender: Female Note Status: Finalized Instrument Name: Patton Salles Endoscope [id number] Procedure:             Upper GI endoscopy Indications:           Melena Providers:             Midge Minium MD, MD Referring MD:          Caren Macadam Medicines:             Propofol per Anesthesia Complications:         No immediate complications. Procedure:             Pre-Anesthesia Assessment:                        - Prior to the procedure, a History and Physical was                         performed, and patient medications and allergies were                         reviewed. The patient's tolerance of previous                         anesthesia was also reviewed. The risks and benefits                         of the procedure and the sedation options and risks                         were discussed with the patient. All questions were                         answered, and informed consent was obtained. Prior                         Anticoagulants: The patient has taken no anticoagulant                         or antiplatelet agents. ASA Grade Assessment: II - A                         patient with mild systemic disease. After reviewing                         the risks and benefits, the patient was deemed in                         satisfactory condition to undergo the procedure.                        After obtaining informed consent, the endoscope was                         passed under direct vision. Throughout the procedure,  the patient's blood pressure, pulse, and oxygen                         saturations were monitored continuously. The Endoscope                         was introduced through the mouth, and advanced to the                          second part of duodenum. The upper GI endoscopy was                         accomplished without difficulty. The patient tolerated                         the procedure well. Findings:      A large hiatal hernia with a few Cameron ulcers was found.      The examined esophagus was significantly tortuous.      Minimal inflammation was found in the entire examined stomach.      The examined duodenum was normal. Impression:            - Large hiatal hernia with a few Cameron ulcers.                        - Tortuous esophagus.                        - Gastritis.                        - Normal examined duodenum.                        - No specimens collected. Recommendation:        - Return patient to hospital ward for ongoing care.                        - Resume previous diet.                        - Continue present medications. Procedure Code(s):     --- Professional ---                        [phone], Esophagogastroduodenoscopy, flexible,                         transoral; diagnostic, including collection of                         specimen(s) by brushing or washing, when performed                         (separate procedure) Diagnosis Code(s):     --- Professional ---                        K92.1, Melena (includes Hematochezia)                        K29.70, Gastritis, unspecified, without bleeding CPT copyright 2022 American Medical  Association. All rights reserved. The codes documented in this report are preliminary and upon coder review may  be revised to meet current compliance requirements. Midge Minium MD, MD 07/29/2023 11:41:12 AM This report has been signed electronically. Number of Addenda: 0 Note Initiated On: 07/29/2023 10:35 AM Estimated Blood Loss:  Estimated blood loss: none.      [facility]

## 2023-07-30 ENCOUNTER — Other Ambulatory Visit (HOSPITAL_COMMUNITY): Payer: Self-pay

## 2023-07-30 ENCOUNTER — Encounter: Payer: Self-pay | Admitting: Gastroenterology

## 2023-07-30 ENCOUNTER — Other Ambulatory Visit: Payer: Self-pay

## 2023-07-30 DIAGNOSIS — K921 Melena: Secondary | ICD-10-CM | POA: Diagnosis not present

## 2023-07-30 LAB — CBC
HCT: 32.3 % — ABNORMAL LOW (ref 36.0–46.0)
Hemoglobin: 10.4 g/dL — ABNORMAL LOW (ref 12.0–15.0)
MCH: 26.9 pg (ref 26.0–34.0)
MCHC: 32.2 g/dL (ref 30.0–36.0)
MCV: 83.5 fL (ref 80.0–100.0)
Platelets: 203 10*3/uL (ref 150–400)
RBC: 3.87 MIL/uL (ref 3.87–5.11)
RDW: 13.3 % (ref 11.5–15.5)
WBC: 4.1 10*3/uL (ref 4.0–10.5)
nRBC: 0 % (ref 0.0–0.2)

## 2023-07-30 LAB — BASIC METABOLIC PANEL WITH GFR
Anion gap: 8 (ref 5–15)
BUN: 13 mg/dL (ref 8–23)
CO2: 25 mmol/L (ref 22–32)
Calcium: 8.7 mg/dL — ABNORMAL LOW (ref 8.9–10.3)
Chloride: 112 mmol/L — ABNORMAL HIGH (ref 98–111)
Creatinine, Ser: 0.61 mg/dL (ref 0.44–1.00)
GFR, Estimated: >60 mL/min (ref >60)
Glucose, Bld: 119 mg/dL — ABNORMAL HIGH (ref 70–99)
Potassium: 3.6 mmol/L (ref 3.5–5.1)
Sodium: 145 mmol/L (ref 135–145)

## 2023-07-30 LAB — GLUCOSE, CAPILLARY
Glucose-Capillary: 99 mg/dL (ref 70–99)
Glucose-Capillary: 142 mg/dL — ABNORMAL HIGH (ref 70–99)
Glucose-Capillary: 155 mg/dL — ABNORMAL HIGH (ref 70–99)
Glucose-Capillary: 129 mg/dL — ABNORMAL HIGH (ref 70–99)
Glucose-Capillary: 128 mg/dL — ABNORMAL HIGH (ref 70–99)

## 2023-07-30 LAB — MAGNESIUM: Magnesium: 1.9 mg/dL (ref 1.7–2.4)

## 2023-07-30 LAB — PHOSPHORUS: Phosphorus: 3.5 mg/dL (ref 2.5–4.6)

## 2023-07-30 MED ORDER — AMLODIPINE BESYLATE 5 MG PO TABS: 5.0000 mg | ORAL_TABLET | Freq: Every day | ORAL | Status: DC

## 2023-07-30 MED ORDER — CARVEDILOL 3.125 MG PO TABS
3.1250 mg | ORAL_TABLET | Freq: Two times a day (BID) | ORAL | Status: DC
  Administered 2023-07-30: 3.125 mg via ORAL
  Filled 2023-07-30: qty 1

## 2023-07-30 MED ORDER — PANCRELIPASE (LIP-PROT-AMYL) 12000-38000 UNITS PO CPEP
24000.0000 [IU] | ORAL_CAPSULE | Freq: Three times a day (TID) | ORAL | Status: DC
  Administered 2023-07-30 – 2023-07-31 (×3): 24000 [IU] via ORAL
  Filled 2023-07-30 (×3): qty 2

## 2023-07-30 MED ORDER — LOSARTAN POTASSIUM 50 MG PO TABS
100.0000 mg | ORAL_TABLET | Freq: Every day | ORAL | Status: DC
  Administered 2023-07-30: 100 mg via ORAL
  Filled 2023-07-30: qty 2

## 2023-07-30 MED ORDER — AMLODIPINE BESYLATE 10 MG PO TABS
10.0000 mg | ORAL_TABLET | Freq: Once | ORAL | Status: DC
Start: 2023-07-30 — End: 2023-07-30

## 2023-07-30 MED ORDER — PANCRELIPASE (LIP-PROT-AMYL) 36000-114000 UNITS PO CPEP
36000.0000 [IU] | ORAL_CAPSULE | Freq: Three times a day (TID) | ORAL | 0 refills | Status: AC
  Filled 2023-07-30: qty 42, 14d supply, fill #0
  Filled 2023-07-30: qty 100, 34d supply, fill #0

## 2023-07-30 MED ORDER — LOSARTAN POTASSIUM 50 MG PO TABS: 100.0000 mg | ORAL_TABLET | Freq: Every evening | ORAL | Status: DC

## 2023-07-30 MED ORDER — SERTRALINE HCL 50 MG PO TABS
150.0000 mg | ORAL_TABLET | Freq: Every day | ORAL | Status: DC
  Administered 2023-07-30 – 2023-07-31 (×2): 150 mg via ORAL
  Filled 2023-07-30 (×2): qty 3

## 2023-07-30 MED ORDER — HYDRALAZINE HCL 50 MG PO TABS
100.0000 mg | ORAL_TABLET | Freq: Once | ORAL | Status: AC
  Administered 2023-07-30: 100 mg via ORAL
  Filled 2023-07-30: qty 2

## 2023-07-30 MED ORDER — AMLODIPINE BESYLATE 5 MG PO TABS
5.0000 mg | ORAL_TABLET | Freq: Every evening | ORAL | Status: DC
Start: 2023-07-30 — End: 2023-07-30
  Administered 2023-07-30: 5 mg via ORAL
  Filled 2023-07-30: qty 1

## 2023-07-30 MED ORDER — SUCRALFATE 1 GM/10ML PO SUSP
1.0000 g | Freq: Two times a day (BID) | ORAL | Status: DC
  Administered 2023-07-30 – 2023-07-31 (×3): 1 g via ORAL
  Filled 2023-07-30 (×3): qty 10

## 2023-07-30 MED ORDER — HYDRALAZINE HCL 50 MG PO TABS
50.0000 mg | ORAL_TABLET | Freq: Four times a day (QID) | ORAL | Status: DC | PRN
  Administered 2023-07-30 – 2023-07-31 (×3): 50 mg via ORAL
  Filled 2023-07-30 (×3): qty 1

## 2023-07-30 MED ORDER — PANTOPRAZOLE SODIUM 40 MG PO TBEC
40.0000 mg | DELAYED_RELEASE_TABLET | Freq: Two times a day (BID) | ORAL | Status: DC
  Administered 2023-07-30 – 2023-07-31 (×3): 40 mg via ORAL
  Filled 2023-07-30 (×3): qty 1

## 2023-07-30 NOTE — Progress Notes (Signed)
 [facility] Progress Note  Patient: Tina Hayes    ZOX:[id number]  DOA: 07/29/2023     Date of Service: the patient was seen and examined on 07/30/2023  Chief Complaint  Patient presents with   Melena   Chest Pain   Brief hospital course: [name] is a 78 y.o. female with medical history significant for HFpEF, COPD, CAD, type 2 diabetes, seizure disorder, PAD on Plavix, hiatal hernia with history of [name] lesions,, hospitalized last September with upper GI bleed after presenting with hematemesis-EGD showing large hiatal hernia with no active bleeding, who presents to the ED with a 2-week history of on and off black tarry stool.  States a few days ago she had coffee-ground emesis and abdominal cramping.  Also has exertional dyspnea, chest pain and a cough.  Denies palpitations.  No vomiting or belly pain.   ED course and data review: BP 166/89 with otherwise normal vitals Hb11.2 which is better than her baseline around 9 Troponin 11 CMP unremarkable except for mildly elevated alk phos of 137. EKG, personally viewed and interpreted showing sinus bradycardia at 59 with nonspecific ST-T wave changes. Chest x-ray nonacute Patient given a fluid bolus, Zofran started on Protonix Hospitalist consulted for admission.     Assessment and Plan:  # Melena/GIB (upper gastrointestinal bleed) # History of Cameron ulcer with hiatal hernia on EGD. # History of upper GI bleed S/p pantoprazole 40 mg IV twice daily.  H&H remained stable. GI consulted, s/p EGD, no active bleeding, found to have Cameron lesions most likely secondary to hiatal hernia.  GI recommended follow-up with general surgery as an outpatient for hiatal hernia surgery, no any further intervention. Resumed pantoprazole 40 mg p.o. twice daily and Carafate.  Recommended to follow-up with GI and PCP as an outpatient persistent GI bleeding or melena.  May benefit from referral to general surgery as an outpatient.  Repeat CBC  after 1 week   # Possible pancreatic insufficiency.  Complaining of diarrhea after eating meals each time for 4 to 5 days.  Prescribed Creon 3 times daily on discharge, patient was advised to take for few days and see if there is any improvement.  Follow-up with PCP for further management as an outpatient.   # Essential hypertension: Resumed home meds BP was elevated, patient received hydralazine as needed. Patient was cleared to discharge but blood pressure was elevated so kept overnight for monitoring.   # Coronary artery disease: No active CP  Cont home meds   # Chronic (HFpEF) heart failure with preserved ejection fraction [facility]) 2D echo April 2022 with EF of 55 to 60% grade 1 diastolic dysfunction No exacerbation.    # Type 2 diabetes mellitus with hyperglycemia and hypoglycemia.  Continue diabetic diet, continue NovoLog sliding scale, monitor CBG at home.   # Occlusion and stenosis of vertebral artery Plavix was held during hospital stay, resumed on discharge, no active bleeding.  Follow-up with PCP, repeat CBC after 1 week.   # Seizures: Resumed home meds. # COPD (chronic obstructive pulmonary disease) No exacerbation. Continue home inhalers.  DuoNebs as needed     Body mass index is 39.05 kg/m.  Interventions:  Diet: Heart healthy diet DVT Prophylaxis: SCD, pharmacological prophylaxis contraindicated due to GI bleed    Advance goals of care discussion: Full code  Family Communication: family was not present at bedside, at the time of interview.  The pt provided permission to discuss medical plan with the family. Opportunity was given to  ask question and all questions were answered satisfactorily.   Disposition:  Pt is from ome, admitted with GI bleeding, patient was cleared to discharge but she had high blood pressure so we will keep for overnight monitoring, plan is to discharge tomorrow a.m.    Subjective: No significant overnight events.  Patient was still  complaining of diarrhea and indigestion, started Creon. Patient was cleared for discharge but she had elevated blood pressure so we will continue to monitor and DC plan tomorrow a.m.  Physical Exam: General: NAD, lying comfortably Appear in no distress, affect appropriate Eyes: PERRLA ENT: Oral Mucosa Clear, moist  Neck: no JVD,  Cardiovascular: S1 and S2 Present, no Murmur,  Respiratory: good respiratory effort, Bilateral Air entry equal and Decreased, no Crackles, no wheezes Abdomen: Bowel Sound present, Soft and no tenderness,  Skin: no rashes Extremities: no Pedal edema, no calf tenderness Neurologic: without any new focal findings Gait not checked due to patient safety concerns  Vitals:   07/30/23 0425 07/30/23 0738 07/30/23 1659 07/30/23 1743  BP: (!) 165/85 (!) 161/99 (!) 192/92 (!) 175/71  Pulse: (!) 59 61 (!) 59 62  Resp: 16 19 16 20   Temp: 98.1 F (36.7 C) 98.3 F (36.8 C)    TempSrc: Oral     SpO2: 97% 98% 95%   Weight:      Height:        Intake/Output Summary (Last 24 hours) at 07/30/2023 1755 Last data filed at 07/29/2023 2313 Gross per 24 hour  Intake 290 ml  Output 450 ml  Net -160 ml   Filed Weights   07/28/23 2059  Weight: 127 kg    Data Reviewed: I have personally reviewed and interpreted daily labs, tele strips, imagings as discussed above. I reviewed all nursing notes, pharmacy notes, vitals, pertinent old records I have discussed plan of care as described above with RN and patient/family.  CBC: Recent Labs  Lab 07/28/23 2103 07/29/23 0249 07/29/23 0929 07/29/23 1703 07/29/23 2104 07/30/23 0357  WBC 4.3  --   --   --   --  4.1  HGB 11.2* 10.8* 10.5* 10.9* 11.4* 10.4*  HCT 34.7*  --   --   --   --  32.3*  MCV 84.6  --   --   --   --  83.5  PLT 228  --   --   --   --  203   Basic Metabolic Panel: Recent Labs  Lab 07/28/23 2103 07/29/23 0929 07/30/23 0357  NA 137 141 145  K 3.6 3.5 3.6  CL 108 111 112*  CO2 21* 24 25  GLUCOSE 145*  124* 119*  BUN 15 15 13   CREATININE 0.99 0.71 0.61  CALCIUM 9.0 8.5* 8.7*  MG  --  1.9 1.9  PHOS  --  3.4 3.5    Studies: No results found.  Scheduled Meds:  amLODipine  5 mg Oral Daily   carvedilol  3.125 mg Oral BID WC   divalproex  250 mg Oral BID   insulin aspart  0-15 Units Subcutaneous TID WC   insulin aspart  0-5 Units Subcutaneous QHS   levETIRAcetam  1,500 mg Oral BID   lipase/protease/amylase  24,000 Units Oral TID AC   losartan  100 mg Oral Daily   pantoprazole  40 mg Oral BID   sertraline  150 mg Oral Daily   sucralfate  1 g Oral BID   traZODone  50 mg Oral QHS   Continuous Infusions:  PRN Meds: acetaminophen **OR** acetaminophen, albuterol, hydrALAZINE, hydrALAZINE, HYDROcodone-acetaminophen, morphine injection, nitroGLYCERIN, ondansetron **OR** ondansetron (ZOFRAN) IV  Time spent: 35 minutes  Author: Gillis Santa. MD [facility] 07/30/2023 5:55 PM  To reach On-call, see care teams to locate the attending and reach out to them via [email or web address]. If 7PM-7AM, please contact night-coverage If you still have difficulty reaching the attending provider, please page the [facility] (Director on Call) for [facility] on amion for assistance.

## 2023-07-30 NOTE — Care Management Obs Status (Signed)
 MEDICARE OBSERVATION STATUS NOTIFICATION   Patient Details  Name: Tina Hayes MRN: [id number] Date of Birth: February 05, 1946   Medicare Observation Status Notification Given:  Yes    Sherilyn Banker 07/30/2023, 3:58 PM

## 2023-07-30 NOTE — Discharge Summary (Signed)
 [facility] Discharge Summary   Patient: Tina Hayes ZOX:[id number]  PCP: Gorden Harms, PA-C (Inactive)  Date of admission: 07/29/2023   Date of discharge: 07/31/2023 07/31/2023     Discharge Diagnoses:  Principal Problem:   Melena Active Problems:   Morbid obesity (HCC)   Admitted From: Home Disposition:  Home   Recommendations for Outpatient Follow-up:  Follow-up with PCP in 1 week, repeat CBC in 1 week Monitor BP at home and follow with PCP to titrate medications accordingly. Follow-up with PCP to decrease polypharmacy, patient is on so many medications. Follow-up with GI as an outpatient Follow-up with General surgery for hiatal hernia as an outpatient. Follow up LABS/TEST:  CBC in 1 wk   Follow-up Information     Gorden Harms, PA-C. Go in 1 week(s).   Specialty: Physician Assistant Why: office will call and schedule f/u appt. Contact information: [address] [place] [address] [phone]                Diet recommendation: Cardiac diet  Activity: The patient is advised to gradually reintroduce usual activities, as tolerated  Discharge Condition: stable  Code Status: Full code   History of present illness: As per the H and P dictated on admission. Hospital Course:  Tina Hayes is a 78 y.o. female with medical history significant for HFpEF, COPD, CAD, type 2 diabetes, seizure disorder, PAD on Plavix, hiatal hernia with history of Tina Hayes lesions,, hospitalized last September with upper GI bleed after presenting with hematemesis-EGD showing large hiatal hernia with no active bleeding, who presents to the ED with a 2-week history of on and off black tarry stool.  States a few days ago she had coffee-ground emesis and abdominal cramping.  Also has exertional dyspnea, chest pain and a cough.  Denies palpitations.  No vomiting or belly pain.  ED course and data review: BP 166/89 with otherwise normal vitals Hb11.2 which is better than her  baseline around 9 Troponin 11 CMP unremarkable except for mildly elevated alk phos of 137. EKG, personally viewed and interpreted showing sinus bradycardia at 59 with nonspecific ST-T wave changes. Chest x-ray nonacute Patient given a fluid bolus, Zofran started on Protonix Hospitalist consulted for admission.    Assessment and Plan:   # Melena/GIB (upper gastrointestinal bleed) # History of Cameron ulcer with hiatal hernia on EGD. # History of upper GI bleed S/p pantoprazole 40 mg IV twice daily.  H&H remained stable. GI consulted, s/p EGD, no active bleeding, found to have Cameron lesions most likely secondary to hiatal hernia.  GI recommended follow-up with general surgery as an outpatient for hiatal hernia surgery, no any further intervention. Resumed pantoprazole 40 mg p.o. twice daily and Carafate BID.  Recommended to follow-up with GI and PCP as an outpatient persistent GI bleeding or melena.  May benefit from referral to general surgery as an outpatient.  Repeat CBC after 1 week  # Possible pancreatic insufficiency.  Complaining of diarrhea after eating meals each time for 4 to 5 days.  Prescribed Creon 3 times daily on discharge, patient was advised to take for few days and see if there is any improvement.  Follow-up with PCP for further management as an outpatient.  # Essential hypertension: Resumed home meds Coreg, losartan and amlodipine. Blood pressure was elevated, so patient received hydralazine, now blood pressure is well-controlled.  Patient was advised to take hydralazine as needed.  Monitor BP at home and follow with PCP.  # Coronary artery disease: No  active CP  Cont home meds  # Chronic (HFpEF) heart failure with preserved ejection fraction [facility]) 2D echo April 2022 with EF of 55 to 60% grade 1 diastolic dysfunction No exacerbation.   # Type 2 diabetes mellitus with hyperglycemia and hypoglycemia.  Continue diabetic diet, continue NovoLog sliding scale, monitor CBG  at home.   # Occlusion and stenosis of vertebral artery Plavix was held during hospital stay, resumed on discharge, no active bleeding.  Follow-up with PCP, repeat CBC after 1 week.  # Seizures: Resumed home meds. # COPD (chronic obstructive pulmonary disease) No exacerbation. Continue home inhalers.  DuoNebs as needed   Body mass index is 39.05 kg/m.  Nutrition Interventions:  - Patient was instructed, not to drive, operate heavy machinery, perform activities at heights, swimming or participation in water activities or provide baby sitting services while on Pain, Sleep and Anxiety Medications; until her outpatient Physician has advised to do so again.  - Also recommended to not to take more than prescribed Pain, Sleep and Anxiety Medications.  Patient was ambulatory without any assistance. On the day of the discharge the patient's vitals were stable, and no other acute medical condition were reported by patient. the patient was felt safe to be discharge at Home.  Consultants: Gi Procedures: s/p EGD  Discharge Exam: General: Appear in no distress, no Rash; Oral Mucosa Clear, moist. Cardiovascular: S1 and S2 Present, no Murmur, Respiratory: normal respiratory effort, Bilateral Air entry present and no Crackles, no wheezes Abdomen: Bowel Sound present, Soft and no tenderness, no hernia Extremities: no Pedal edema, no calf tenderness Neurology: alert and oriented to time, place, and person affect appropriate.  Filed Weights   07/28/23 2059  Weight: 127 kg   Vitals:   07/31/23 1123 07/31/23 1231  BP: (!) 152/60 (!) 147/74  Pulse: (!) 57 63  Resp:    Temp:    SpO2:      DISCHARGE MEDICATION: Allergies as of 07/31/2023       Reactions   Aspirin Anaphylaxis, Shortness Of Breath, Swelling   LIPS AND THROAT SWELL, DIFFICULT TO BREATH        Medication List     STOP taking these medications    omeprazole 40 MG capsule Commonly known as: PRILOSEC       TAKE these  medications    acetaminophen 500 MG tablet Commonly known as: TYLENOL Take 500 mg by mouth every 6 (six) hours as needed for headache, moderate pain or fever.   albuterol (2.5 MG/3ML) 0.083% nebulizer solution Commonly known as: PROVENTIL Take 2.5 mg every 4 (four) hours as needed by nebulization for wheezing or shortness of breath.   albuterol 108 (90 Base) MCG/ACT inhaler Commonly known as: VENTOLIN HFA Inhale 2 puffs into the lungs every 6 (six) hours as needed for wheezing or shortness of breath.   amitriptyline 25 MG tablet Commonly known as: ELAVIL Take 25 mg by mouth at bedtime.   amLODipine 5 MG tablet Commonly known as: NORVASC Take 1 tablet by mouth once daily   atorvastatin 40 MG tablet Commonly known as: LIPITOR Take 1 tablet (40 mg total) by mouth daily.   B-D UF III MINI PEN NEEDLES 31G X 5 MM Misc Generic drug: Insulin Pen Needle USE WITH INSULIN PEN INJECTIONS TWICE DAILY   carvedilol 3.125 MG tablet Commonly known as: COREG Take 1 tablet (3.125 mg total) by mouth 2 (two) times daily with a meal.   clopidogrel 75 MG tablet Commonly known as: PLAVIX Take  75 mg by mouth daily.   Creon 36000-114000 units Cpep capsule Generic drug: lipase/protease/amylase Take 1 capsule (36,000 Units total) by mouth 3 (three) times daily before meals for 14 days.   divalproex 250 MG DR tablet Commonly known as: DEPAKOTE Take 1 tablet (250 mg total) by mouth 2 (two) times daily.   Dupixent 300 MG/2ML Soaj Generic drug: Dupilumab Inject into the skin.   hydrALAZINE 50 MG tablet Commonly known as: APRESOLINE Take 1 tablet (50 mg total) by mouth 3 (three) times daily as needed (Systolic BP greater than 150).   HYDROcodone-acetaminophen 7.5-325 MG tablet Commonly known as: NORCO Take 1 tablet by mouth every 6 (six) hours as needed for moderate pain (pain score 4-6) or severe pain (pain score 7-10).   insulin aspart 100 UNIT/ML FlexPen Commonly known as:  NOVOLOG Inject 8 units subQ if blood sugar is consistently over 300 for more than 6 hours despite taking your other prescribed insulin. Repeat up to three times a day (every 8 hours) until blood sugars improve.   Iron 325 (65 Fe) MG Tabs Take 1 tablet by mouth daily.   Lantus SoloStar 100 UNIT/ML Solostar Pen Generic drug: insulin glargine Inject 10 Units into the skin See admin instructions. Inject 30 units subcutaneously in the morning at 35 units subcutaneously at bedtime.   levETIRAcetam 750 MG tablet Commonly known as: KEPPRA Take 2 tablets (1,500 mg total) by mouth 2 (two) times daily.   losartan 100 MG tablet Commonly known as: COZAAR Take 1 tablet by mouth once daily   montelukast 10 MG tablet Commonly known as: SINGULAIR Take 10 mg by mouth daily.   naloxone 4 MG/0.1ML Liqd nasal spray kit Commonly known as: NARCAN As directed for opioid induced respiratory depression   nitroGLYCERIN 0.4 MG SL tablet Commonly known as: NITROSTAT DISSOLVE ONE TABLET UNDER THE TONGUE EVERY 5 MINUTES AS NEEDED FOR CHEST PAIN.  DO NOT EXCEED A TOTAL OF 3 DOSES IN 15 MINUTES   pantoprazole 40 MG tablet Commonly known as: PROTONIX Take 40 mg by mouth 2 (two) times daily.   sertraline 100 MG tablet Commonly known as: ZOLOFT Take 150 mg by mouth daily.   sucralfate 1 GM/10ML suspension Commonly known as: CARAFATE Take 10 mLs (1 g total) by mouth 2 (two) times daily. What changed: when to take this   tiotropium 18 MCG inhalation capsule Commonly known as: SPIRIVA Place 18 mcg into inhaler and inhale daily.   traZODone 50 MG tablet Commonly known as: DESYREL Take 50 mg by mouth at bedtime.       Allergies  Allergen Reactions   Aspirin Anaphylaxis, Shortness Of Breath and Swelling    LIPS AND THROAT SWELL, DIFFICULT TO BREATH   Discharge Instructions     Call MD for:   Complete by: As directed    GI bleeding or dark stools, any nausea vomiting or abdominal pain   Call MD  for:  difficulty breathing, headache or visual disturbances   Complete by: As directed    Call MD for:  extreme fatigue   Complete by: As directed    Call MD for:  persistant dizziness or light-headedness   Complete by: As directed    Call MD for:  severe uncontrolled pain   Complete by: As directed    Call MD for:  temperature >100.4   Complete by: As directed    Diet - low sodium heart healthy   Complete by: As directed    Discharge instructions  Complete by: As directed    Follow-up with PCP in 1 week, repeat CBC in 1 week Monitor BP at home and follow with PCP to titrate medications accordingly. Follow-up with PCP to decrease polypharmacy, patient is on so any medications. Follow-up with GI as an outpatient Follow-up with general surgery for hiatal hernia as an outpatient.   Increase activity slowly   Complete by: As directed        The results of significant diagnostics from this hospitalization (including imaging, microbiology, ancillary and laboratory) are listed below for reference.    Significant Diagnostic Studies: DG Chest Port 1 View Result Date: 07/28/2023 CLINICAL DATA:  Worsening chest pain, short of breath, cough EXAM: PORTABLE CHEST 1 VIEW COMPARISON:  12/10/2022 FINDINGS: Single frontal view of the chest demonstrates stable enlargement of the cardiac silhouette. 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 Stable hiatal hernia. No airspace disease, effusion, or pneumothorax. No acute bony abnormalities. IMPRESSION: 1. No acute intrathoracic process.  Stable exam. Electronically Signed   By: Sharlet Salina M.D.   On: 07/28/2023 21:27    Microbiology: No results found for this or any previous visit (from the past 240 hours).   Labs: CBC: Recent Labs  Lab 07/28/23 2103 07/29/23 0249 07/29/23 0929 07/29/23 1703 07/29/23 2104 07/30/23 0357  WBC 4.3  --   --   --   --  4.1  HGB 11.2* 10.8* 10.5* 10.9* 11.4* 10.4*  HCT 34.7*  --   --   --   --  32.3*  MCV 84.6  --    --   --   --  83.5  PLT 228  --   --   --   --  203   Basic Metabolic Panel: Recent Labs  Lab 07/28/23 2103 07/29/23 0929 07/30/23 0357  NA 137 141 145  K 3.6 3.5 3.6  CL 108 111 112*  CO2 21* 24 25  GLUCOSE 145* 124* 119*  BUN 15 15 13   CREATININE 0.99 0.71 0.61  CALCIUM 9.0 8.5* 8.7*  MG  --  1.9 1.9  PHOS  --  3.4 3.5   Liver Function Tests: Recent Labs  Lab 07/28/23 2103  AST 18  ALT 16  ALKPHOS 137*  BILITOT 0.3  PROT 7.1  ALBUMIN 3.6   No results for input(s): "LIPASE", "AMYLASE" in the last 168 hours. No results for input(s): "AMMONIA" in the last 168 hours. Cardiac Enzymes: No results for input(s): "CKTOTAL", "CKMB", "CKMBINDEX", "TROPONINI" in the last 168 hours. BNP (last 3 results) No results for input(s): "BNP" in the last 8760 hours. CBG: Recent Labs  Lab 07/30/23 1652 07/30/23 2034 07/31/23 0532 07/31/23 0736 07/31/23 1135  GLUCAP 129* 128* 166* 152* 137*    Time spent: 35 minutes  Signed:  Gillis Santa  [facility] 4/4/20254/07/2023 4:24 PM

## 2023-07-30 NOTE — Plan of Care (Signed)
  Problem: Coping: Goal: Ability to adjust to condition or change in health will improve Outcome: Progressing   Problem: Health Behavior/Discharge Planning: Goal: Ability to manage health-related needs will improve Outcome: Progressing   Problem: Nutritional: Goal: Maintenance of adequate nutrition will improve Outcome: Progressing   Problem: Education: Goal: Ability to describe self-care measures that may prevent or decrease complications (Diabetes Survival Skills Education) will improve Outcome: Adequate for Discharge   Problem: Fluid Volume: Goal: Ability to maintain a balanced intake and output will improve Outcome: Adequate for Discharge   Problem: Health Behavior/Discharge Planning: Goal: Ability to identify and utilize available resources and services will improve Outcome: Adequate for Discharge   Problem: Skin Integrity: Goal: Risk for impaired skin integrity will decrease Outcome: Adequate for Discharge   Problem: Tissue Perfusion: Goal: Adequacy of tissue perfusion will improve Outcome: Adequate for Discharge

## 2023-07-31 ENCOUNTER — Other Ambulatory Visit: Payer: Self-pay

## 2023-07-31 DIAGNOSIS — K921 Melena: Secondary | ICD-10-CM | POA: Diagnosis not present

## 2023-07-31 LAB — GLUCOSE, CAPILLARY
Glucose-Capillary: 166 mg/dL — ABNORMAL HIGH (ref 70–99)
Glucose-Capillary: 152 mg/dL — ABNORMAL HIGH (ref 70–99)
Glucose-Capillary: 137 mg/dL — ABNORMAL HIGH (ref 70–99)

## 2023-07-31 MED ORDER — LOSARTAN POTASSIUM 50 MG PO TABS
100.0000 mg | ORAL_TABLET | Freq: Every day | ORAL | Status: DC
  Administered 2023-07-31: 100 mg via ORAL
  Filled 2023-07-31: qty 2

## 2023-07-31 MED ORDER — AMLODIPINE BESYLATE 5 MG PO TABS
5.0000 mg | ORAL_TABLET | Freq: Every day | ORAL | Status: DC
  Administered 2023-07-31: 5 mg via ORAL
  Filled 2023-07-31: qty 1

## 2023-07-31 MED ORDER — CARVEDILOL 3.125 MG PO TABS
3.1250 mg | ORAL_TABLET | Freq: Two times a day (BID) | ORAL | Status: DC
  Administered 2023-07-31: 3.125 mg via ORAL
  Filled 2023-07-31: qty 1

## 2023-07-31 MED ORDER — SUCRALFATE 1 GM/10ML PO SUSP: 1.0000 g | Freq: Two times a day (BID) | ORAL | 0 refills | Status: AC

## 2023-07-31 MED ORDER — HYDRALAZINE HCL 50 MG PO TABS: 50.0000 mg | ORAL_TABLET | Freq: Three times a day (TID) | ORAL | 0 refills | Status: AC | PRN

## 2023-07-31 NOTE — Discharge Instructions (Signed)
 Your nurse navigator, Sharonda Llamas, can be reached at [phone]   Medicaid Transportation: [id number]       Please call 24-48 hours in advance to schedule transportation to/from appointments.   Your application for the [employer] was faxed in today. They will contact you for any further questions or missing information. Please remember there is a long wait list.
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
## 2023-07-31 NOTE — Progress Notes (Signed)
 Pt BP elevated at time of discharge. Notified Kumar MD. Pt given amlodipine, carvedilol and hydralazine. BP decreased slightly. Advised to keep patient until systolic less than 160. Pt will stay overnight for monitoring and reassess discharge on 08/01/23.

## 2023-07-31 NOTE — TOC Transition Note (Signed)
 Transition of Care [facility]) - Discharge Note   Patient Details  Name: Tina Hayes MRN: [id number] Date of Birth: 1945/09/30  Transition of [facility]) CM/SW Contact:  Cherre Blanc, RN Phone Number: 07/31/2023, 12:32 PM   Clinical Narrative:    TOC is medically clear for DC to home. Patient's family will provide transportation.   Final next level of care: Home/Self Care Barriers to Discharge: Continued Medical Work up   Patient Goals and CMS Choice            Discharge Placement                       Discharge Plan and Services Additional resources added to the After Visit Summary for     Discharge Planning Services: CM Consult                                 Social Drivers of Health (SDOH) Interventions SDOH Screenings   Food Insecurity: No Food Insecurity (07/29/2023)  Recent Concern: Food Insecurity - Food Insecurity Present (06/04/2023)   [facility]  Housing: High Risk (07/29/2023)  Transportation Needs: Unmet Transportation Needs (07/29/2023)  Utilities: Not At Risk (07/29/2023)  Recent Concern: Utilities - At Risk (06/04/2023)   Received from [facility]  Depression [phone]): Low Risk  (04/06/2023)  Financial Resource Strain: High Risk (06/04/2023)   Received from [facility]  Physical Activity: Unknown (02/16/2019)  Social Connections: Moderately Isolated (07/29/2023)  Stress: No Stress Concern Present (02/16/2019)  Tobacco Use: Medium Risk (07/28/2023)     Readmission Risk Interventions     No data to display

## 2023-07-31 NOTE — TOC Initial Note (Signed)
 Transition of Care [facility]) - Initial/Assessment Note    Patient Details  Name: Tina Hayes MRN: [id number] Date of Birth: 08-06-1945  Transition of Care [facility]) CM/SW Contact:    Cherre Blanc, RN Phone Number: 07/31/2023, 6:16 AM  Clinical Narrative:                 TOC spoke with patient in the room. TOC observed a noticeable strong odor in the room. The patient lives with her son and his family, but advised they don't help her with her care.   The patient shared that she would like assistance with getting her own place. 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 Additionally, she gets food and the children in the home eat it.   She states that she has had several "good" doctors, but they will no longer see her because she can't get to appointments. TOC asked her if she used transportation benefits available to her via Medicaid. TOC advised that Medicaid will provide transportation to medical appointments and encouraged the patient to call the number on the back of her Medicaid card and ask for assistance with scheduling a ride at least 48 hours prior to her medical appointments.   TOC will provide a list of [employer] found at the link below: [email or web address]  TOC will send a referral to the 1 C Patient Navigator to follow the patient in the community.  TOC will continue to follow the patient.   Expected Discharge Plan: Home/Self Care Barriers to Discharge: Other (must enter comment) (patient lives with a child she raised and his family)   Patient Goals and CMS Choice            Expected Discharge Plan and Services   Discharge Planning Services: CM Consult   Living arrangements for the past 2 months: Single Family Home Expected Discharge Date: 07/30/23                                    Prior Living  Arrangements/Services Living arrangements for the past 2 months: Single Family Home Lives with:: Adult Children                   Activities of Daily Living   ADL Screening (condition at time of admission) Independently performs ADLs?: Yes (appropriate for developmental age) Is the patient deaf or have difficulty hearing?: No Does the patient have difficulty seeing, even when wearing glasses/contacts?: No Does the patient have difficulty concentrating, remembering, or making decisions?: No  Permission Sought/Granted                  Emotional Assessment              Admission diagnosis:  Melena [K92.1] Epigastric pain [R10.13] Nonspecific chest pain [R07.9] Patient Active Problem List   Diagnosis Date Noted   Choking 01/04/2023   Upper GI bleed 01/02/2023   (HFpEF) heart failure with preserved ejection fraction (HCC) 01/02/2023   Other chest pain 04/11/2022   Generalized weakness 04/10/2022   Acute lower UTI 04/10/2022   Chest pain 04/10/2022   Dyslipidemia 04/10/2022   Chronic obstructive pulmonary disease (COPD) (HCC) 04/10/2022   Type 2 diabetes mellitus without complications (HCC) 04/10/2022   GERD without esophagitis 04/10/2022   Headache 08/09/2021   Melena    Coffee  ground emesis    Paraesophageal hernia    Cameron ulcer, acute    Acute upper GI bleed 02/24/2021   Abnormal EKG 02/24/2021   Acute on chronic anemia 02/24/2021   Status post total knee replacement using cement, 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   Atherosclerosis of native arteries of the extremities with ulceration (HCC) 07/05/2019   Onychomycosis 05/30/2019   GERD (gastroesophageal reflux disease) 12/14/2018   Presbyesophagus 12/14/2018   Occlusion and stenosis of vertebral artery 08/31/2018   AKI (acute kidney injury) (HCC) 05/25/2018   BMI 50.0-59.9, adult (HCC) 04/16/2018   Seizures (HCC) 11/10/2017   Mass of right lower leg 09/11/2017   Dysphagia 04/23/2017   Chronic diastolic congestive  heart failure (HCC) 02/13/2017   Acute diastolic heart failure (HCC) 01/27/2017   Depression 12/11/2016   Essential hypertension 06/30/2016   Snoring 06/30/2016   Diabetes mellitus (HCC) 06/30/2016   Coronary artery disease 06/17/2016   Morbid obesity (HCC) 06/17/2016   Angina pectoris (HCC) 06/16/2016   COPD (chronic obstructive pulmonary disease) (HCC) 08/28/2015   COPD with acute exacerbation (HCC) 04/14/2015   PCP:  Gorden Harms, PA-C (Inactive) Pharmacy:   [facility] [address]), [place] - [address] [place]) [place] [address] Phone: [phone] Fax: [phone]  [facility] - [place], [place] - [address] [place] [address] Phone: [phone] Fax: [phone]  [facility] [address] [place] [address] Phone: [phone] Fax: [phone]     Social Drivers of Health (SDOH) Social History: SDOH Screenings   Food Insecurity: No Food Insecurity (07/29/2023)  Recent Concern: Food Insecurity - Food Insecurity Present (06/04/2023)   Received from [facility]  Housing: High Risk (07/29/2023)  Transportation Needs: Unmet Transportation Needs (07/29/2023)  Utilities: Not At Risk (07/29/2023)  Recent Concern: Utilities - At Risk (06/04/2023)   Received from [facility]  Depression [phone]): Low Risk  (04/06/2023)  Financial Resource Strain: High Risk (06/04/2023)   Received from [facility]  Physical Activity: Unknown (02/16/2019)  Social Connections: Moderately Isolated (07/29/2023)  Stress: No Stress Concern Present (02/16/2019)  Tobacco Use: Medium Risk (07/28/2023)   SDOH Interventions:     Readmission Risk Interventions     No data to display

## 2023-08-03 ENCOUNTER — Ambulatory Visit: Attending: Anesthesiology | Admitting: Anesthesiology

## 2023-08-03 ENCOUNTER — Telehealth: Payer: Self-pay | Admitting: Anesthesiology

## 2023-08-03 ENCOUNTER — Encounter: Payer: Self-pay | Admitting: Anesthesiology

## 2023-08-03 DIAGNOSIS — M5432 Sciatica, left side: Secondary | ICD-10-CM

## 2023-08-03 DIAGNOSIS — M5136 Other intervertebral disc degeneration, lumbar region with discogenic back pain only: Secondary | ICD-10-CM

## 2023-08-03 DIAGNOSIS — F1190 Opioid use, unspecified, uncomplicated: Secondary | ICD-10-CM

## 2023-08-03 DIAGNOSIS — M5431 Sciatica, right side: Secondary | ICD-10-CM

## 2023-08-03 DIAGNOSIS — G4486 Cervicogenic headache: Secondary | ICD-10-CM

## 2023-08-03 DIAGNOSIS — G894 Chronic pain syndrome: Secondary | ICD-10-CM | POA: Diagnosis not present

## 2023-08-03 DIAGNOSIS — M4716 Other spondylosis with myelopathy, lumbar region: Secondary | ICD-10-CM

## 2023-08-03 DIAGNOSIS — M47817 Spondylosis without myelopathy or radiculopathy, lumbosacral region: Secondary | ICD-10-CM

## 2023-08-03 DIAGNOSIS — M48062 Spinal stenosis, lumbar region with neurogenic claudication: Secondary | ICD-10-CM

## 2023-08-03 MED ORDER — HYDROCODONE-ACETAMINOPHEN 7.5-325 MG PO TABS: 1.0000 | ORAL_TABLET | Freq: Four times a day (QID) | ORAL | 0 refills | Status: AC | PRN

## 2023-08-03 NOTE — Telephone Encounter (Signed)
 Patient needs to come in and do a UDS and needs an appointment with Dr Pernell Dupre.

## 2023-08-03 NOTE — Telephone Encounter (Signed)
 PT called stated that when she called pharmacy she was inform that she didn't have any refill for hydrocodone. PT stated that she just got out of hospital. Please give patient a call. TY

## 2023-08-03 NOTE — Progress Notes (Signed)
 Virtual Visit via Telephone Note  I connected with Tina Hayes on 08/03/23 at  4:00 PM EDT by telephone and verified that I am speaking with the correct person using two identifiers.  Location: Patient: Home Provider: Pain control center   I discussed the limitations, risks, security and privacy concerns of performing an evaluation and management service by telephone and the availability of in person appointments. I also discussed with the patient that there may be a patient responsible charge related to this service. The patient expressed understanding and agreed to proceed.   History of Present Illness: I spoke with Tina Hayes via telephone as we were unable in for the video portion of the conference.  She was recently hospitalized secondary to some upper esophageal spasm and blood pressure issues but is doing better she reports.  She is still having considerable low back pain with lower extremity sciatica symptoms and is scheduled for an epidural in about a week and the pain control center.  She reports that at present she is still taking her hydrocodone 7.5 mg tablets 4 times a day and these are working well but is due for refill.  No side effects with the medicine are noted.  No problems with constipation or urinary retention or sedation are reported.  Otherwise she is in her usual state of health.  She generally gets 50 to 70% relief lasting 4 to 6 hours with the medicines and has failed more conservative therapy but reports that the medicines enable her to stay reasonably active and sleep better whereas she has failed more conservative therapy.  At present she is also having a significant amount of bilateral lower calf and hip pain consistent with the sciatica she has reported in the past.  She has had epidurals approximately 2 to 3/year generally having excellent relief enabling her to be much more comfortable for 2 to 3 months following the injection.  She reports 80 to 90% relief of her  sciatica and about 70% to 80% relief of her lower back and hip pain.  Review of systems: General: No fevers or chills Pulmonary: No shortness of breath or dyspnea Cardiac: No angina or palpitations or lightheadedness GI: No abdominal pain or constipation Psych: No depression    Observations/Objective:  Current Outpatient Medications:    [START ON 09/02/2023] HYDROcodone-acetaminophen (NORCO) 7.5-325 MG tablet, Take 1 tablet by mouth every 6 (six) hours as needed for moderate pain (pain score 4-6) or severe pain (pain score 7-10)., Disp: 120 tablet, Rfl: 0   acetaminophen (TYLENOL) 500 MG tablet, Take 500 mg by mouth every 6 (six) hours as needed for headache, moderate pain or fever., Disp: , Rfl:    albuterol (PROVENTIL HFA;VENTOLIN HFA) 108 (90 Base) MCG/ACT inhaler, Inhale 2 puffs into the lungs every 6 (six) hours as needed for wheezing or shortness of breath., Disp: 1 Inhaler, Rfl: 2   albuterol (PROVENTIL) (2.5 MG/3ML) 0.083% nebulizer solution, Take 2.5 mg every 4 (four) hours as needed by nebulization for wheezing or shortness of breath., Disp: , Rfl:    amitriptyline (ELAVIL) 25 MG tablet, Take 25 mg by mouth at bedtime., Disp: , Rfl:    amLODipine (NORVASC) 5 MG tablet, Take 1 tablet by mouth once daily, Disp: 90 tablet, Rfl: 1   atorvastatin (LIPITOR) 40 MG tablet, Take 1 tablet (40 mg total) by mouth daily., Disp: 90 tablet, Rfl: 3   B-D UF III MINI PEN NEEDLES 31G X 5 MM MISC, USE WITH INSULIN PEN INJECTIONS TWICE  DAILY, Disp: , Rfl:    carvedilol (COREG) 3.125 MG tablet, Take 1 tablet (3.125 mg total) by mouth 2 (two) times daily with a meal., Disp: 90 tablet, Rfl: 1   clopidogrel (PLAVIX) 75 MG tablet, Take 75 mg by mouth daily., Disp: , Rfl:    divalproex (DEPAKOTE) 250 MG DR tablet, Take 1 tablet (250 mg total) by mouth 2 (two) times daily., Disp: 60 tablet, Rfl: 11   DUPIXENT 300 MG/2ML SOPN, Inject into the skin., Disp: , Rfl:    Ferrous Sulfate (IRON) 325 (65 Fe) MG TABS,  Take 1 tablet by mouth daily., Disp: , Rfl:    hydrALAZINE (APRESOLINE) 50 MG tablet, Take 1 tablet (50 mg total) by mouth 3 (three) times daily as needed (Systolic BP greater than 150)., Disp: 100 tablet, Rfl: 0   HYDROcodone-acetaminophen (NORCO) 7.5-325 MG tablet, Take 1 tablet by mouth every 6 (six) hours as needed for moderate pain (pain score 4-6) or severe pain (pain score 7-10)., Disp: 120 tablet, Rfl: 0   insulin aspart (NOVOLOG) 100 UNIT/ML FlexPen, Inject 8 units subQ if blood sugar is consistently over 300 for more than 6 hours despite taking your other prescribed insulin. Repeat up to three times a day (every 8 hours) until blood sugars improve., Disp: 15 mL, Rfl: 0   LANTUS SOLOSTAR 100 UNIT/ML Solostar Pen, Inject 10 Units into the skin See admin instructions. Inject 30 units subcutaneously in the morning at 35 units subcutaneously at bedtime., Disp: 15 mL, Rfl: 11   levETIRAcetam (KEPPRA) 750 MG tablet, Take 2 tablets (1,500 mg total) by mouth 2 (two) times daily., Disp: 120 tablet, Rfl: 11   lipase/protease/amylase (CREON) 36000 UNITS CPEP capsule, Take 1 capsule (36,000 Units total) by mouth 3 (three) times daily before meals for 14 days., Disp: 100 capsule, Rfl: 0   losartan (COZAAR) 100 MG tablet, Take 1 tablet by mouth once daily, Disp: 90 tablet, Rfl: 3   montelukast (SINGULAIR) 10 MG tablet, Take 10 mg by mouth daily., Disp: , Rfl:    naloxone (NARCAN) nasal spray 4 mg/0.1 mL, As directed for opioid induced respiratory depression, Disp: 1 each, Rfl: 1   nitroGLYCERIN (NITROSTAT) 0.4 MG SL tablet, DISSOLVE ONE TABLET UNDER THE TONGUE EVERY 5 MINUTES AS NEEDED FOR CHEST PAIN.  DO NOT EXCEED A TOTAL OF 3 DOSES IN 15 MINUTES, Disp: 25 tablet, Rfl: 0   pantoprazole (PROTONIX) 40 MG tablet, Take 40 mg by mouth 2 (two) times daily., Disp: , Rfl:    sertraline (ZOLOFT) 100 MG tablet, Take 150 mg by mouth daily., Disp: , Rfl:    sucralfate (CARAFATE) 1 GM/10ML suspension, Take 10 mLs (1 g  total) by mouth 2 (two) times daily., Disp: 600 mL, Rfl: 0   tiotropium (SPIRIVA) 18 MCG inhalation capsule, Place 18 mcg into inhaler and inhale daily., Disp: , Rfl:    traZODone (DESYREL) 50 MG tablet, Take 50 mg by mouth at bedtime., Disp: , Rfl:    Past Medical History:  Diagnosis Date   (HFpEF) heart failure with preserved ejection fraction (HCC)    a. 05/2016 Echo: EF 60-65%, mild to mod LVH, Gr1 DD, mild MR, mildly dil LA, mod TR, mildly to mod increased PASP; b. 07/2020 Echo: EF 55-60%, no rwma, mild LVH, GrIDD, mildly dil LA, mild MR, mild-mod TR, mild-mod AoV sclerosis w/o stenosis.   Anxiety    Arthritis    Chronic back pain    COPD (chronic obstructive pulmonary disease) (HCC)  Coronary artery disease    a. s/p remote PCI x 5;  b. 2006 s/p CABG x 3 ([place], [place] - [facility]); b. 05/2016 MV: attenuation corrected images w/o ischemia or wma-->Med rx; c. 06/2020 MV: EF 55-65%, no isch/infart-->low risk.   Depression    Diabetes mellitus without complication (HCC)    Essential hypertension 06/30/2016   GERD (gastroesophageal reflux disease)    Heart attack (HCC)    Total of 3 per pt.   Hyperlipidemia LDL goal <55    Hypertensive urgency 06/03/2015   PAD (peripheral artery disease) (HCC)    a. 06/2019 Angio: R AT (PTA), R POP (PTA & Viabahn stenting); b. 09/2020 ABIs: R 1.24, L 1.35.   Palpitations    a. 06/2020 Zio: Sinus brady, 54 (40-143), 4 beats NSVT, 3 SVT runs (max 143 bpm x 4 beats).   Seizure (HCC)    Assessment and Plan:  1. Chronic pain syndrome   2. Chronic, continuous use of opioids   3. Degeneration of intervertebral disc of lumbar region with discogenic back pain   4. Bilateral sciatica   5. Spinal stenosis of lumbar region with neurogenic claudication   6. Facet arthritis of lumbosacral region   7. Lumbar spondylosis with myelopathy   8. Cervicogenic headache   9. Morbid obesity [facility])    Patient our conversation is appropriate to refill her  medicines.  She is due today.  Refills will be generated for April 7 and May 7.  She is scheduled for return to clinic in approximately 1 week for an epidural steroid injection I think this is appropriate.  She is coming off her blood thinners in advance of this.  We have discussed risk benefits of the injection she understands these and desires to comply with this.  I have encouraged her to continue with efforts at weight loss and better blood pressure control especially with her history of congestive failure.  She seems to be doing well with the opioid medications and I have reviewed the [facility] practitioner database information is appropriate for refill.  Continue follow-up with her primary care physicians for baseline medical care and we will see her in about 1 week. Follow Up Instructions:    I discussed the assessment and treatment plan with the patient. The patient was provided an opportunity to ask questions and all were answered. The patient agreed with the plan and demonstrated an understanding of the instructions.   The patient was advised to call back or seek an in-person evaluation if the symptoms worsen or if the condition fails to improve as anticipated.  I provided 30 minutes of non-face-to-face time during this encounter.   Yevette Edwards, MD

## 2023-08-10 ENCOUNTER — Ambulatory Visit (HOSPITAL_BASED_OUTPATIENT_CLINIC_OR_DEPARTMENT_OTHER): Admitting: Anesthesiology

## 2023-08-10 ENCOUNTER — Other Ambulatory Visit: Payer: Self-pay | Admitting: Anesthesiology

## 2023-08-10 ENCOUNTER — Ambulatory Visit
Admission: RE | Admit: 2023-08-10 | Discharge: 2023-08-10 | Disposition: A | Discharge status: Home / Self Care | Source: Ambulatory Visit | Attending: Anesthesiology | Admitting: Anesthesiology

## 2023-08-10 ENCOUNTER — Encounter: Payer: Self-pay | Admitting: Anesthesiology

## 2023-08-10 VITALS — BP 224/79 | HR 57 | Temp 97.3°F | Resp 16 | Ht 71.0 in | Wt 285.0 lb

## 2023-08-10 DIAGNOSIS — M47817 Spondylosis without myelopathy or radiculopathy, lumbosacral region: Secondary | ICD-10-CM | POA: Diagnosis present

## 2023-08-10 DIAGNOSIS — F1190 Opioid use, unspecified, uncomplicated: Secondary | ICD-10-CM | POA: Diagnosis present

## 2023-08-10 DIAGNOSIS — M5431 Sciatica, right side: Secondary | ICD-10-CM | POA: Diagnosis present

## 2023-08-10 DIAGNOSIS — G4486 Cervicogenic headache: Secondary | ICD-10-CM | POA: Insufficient documentation

## 2023-08-10 DIAGNOSIS — R52 Pain, unspecified: Secondary | ICD-10-CM | POA: Insufficient documentation

## 2023-08-10 DIAGNOSIS — M5432 Sciatica, left side: Secondary | ICD-10-CM

## 2023-08-10 DIAGNOSIS — M48062 Spinal stenosis, lumbar region with neurogenic claudication: Secondary | ICD-10-CM | POA: Diagnosis present

## 2023-08-10 DIAGNOSIS — G894 Chronic pain syndrome: Secondary | ICD-10-CM | POA: Diagnosis not present

## 2023-08-10 DIAGNOSIS — M4716 Other spondylosis with myelopathy, lumbar region: Secondary | ICD-10-CM

## 2023-08-10 DIAGNOSIS — M5136 Other intervertebral disc degeneration, lumbar region with discogenic back pain only: Secondary | ICD-10-CM

## 2023-08-10 NOTE — Progress Notes (Unsigned)
 Informed Dr. Welton Hayes of elevated BP . Patient scheduled to see PCP tomorrow. Will return at 1500 tomorrow.

## 2023-08-11 ENCOUNTER — Ambulatory Visit
Admission: RE | Admit: 2023-08-11 | Discharge: 2023-08-11 | Disposition: A | Discharge status: Home / Self Care | Source: Ambulatory Visit | Attending: Anesthesiology | Admitting: Anesthesiology

## 2023-08-11 ENCOUNTER — Ambulatory Visit (HOSPITAL_BASED_OUTPATIENT_CLINIC_OR_DEPARTMENT_OTHER): Admitting: Anesthesiology

## 2023-08-11 ENCOUNTER — Encounter: Payer: Self-pay | Admitting: Anesthesiology

## 2023-08-11 VITALS — BP 178/109 | HR 102 | Temp 97.2°F | Resp 20 | Ht 71.0 in | Wt 285.0 lb

## 2023-08-11 DIAGNOSIS — M5432 Sciatica, left side: Secondary | ICD-10-CM | POA: Diagnosis present

## 2023-08-11 DIAGNOSIS — R52 Pain, unspecified: Secondary | ICD-10-CM | POA: Diagnosis present

## 2023-08-11 DIAGNOSIS — M48062 Spinal stenosis, lumbar region with neurogenic claudication: Secondary | ICD-10-CM | POA: Diagnosis present

## 2023-08-11 DIAGNOSIS — M47817 Spondylosis without myelopathy or radiculopathy, lumbosacral region: Secondary | ICD-10-CM | POA: Insufficient documentation

## 2023-08-11 DIAGNOSIS — G894 Chronic pain syndrome: Secondary | ICD-10-CM

## 2023-08-11 DIAGNOSIS — G4486 Cervicogenic headache: Secondary | ICD-10-CM

## 2023-08-11 DIAGNOSIS — M5136 Other intervertebral disc degeneration, lumbar region with discogenic back pain only: Secondary | ICD-10-CM

## 2023-08-11 DIAGNOSIS — M5431 Sciatica, right side: Secondary | ICD-10-CM | POA: Insufficient documentation

## 2023-08-11 DIAGNOSIS — M4716 Other spondylosis with myelopathy, lumbar region: Secondary | ICD-10-CM

## 2023-08-11 DIAGNOSIS — F1190 Opioid use, unspecified, uncomplicated: Secondary | ICD-10-CM

## 2023-08-11 MED ORDER — MIDAZOLAM HCL 2 MG/2ML IJ SOLN
INTRAMUSCULAR | Status: AC
  Filled 2023-08-11: qty 2

## 2023-08-11 MED ORDER — IOHEXOL 180 MG/ML  SOLN
INTRAMUSCULAR | Status: AC
  Filled 2023-08-11: qty 20

## 2023-08-11 MED ORDER — LIDOCAINE HCL (PF) 1 % IJ SOLN
INTRAMUSCULAR | Status: AC
  Filled 2023-08-11: qty 10

## 2023-08-11 MED ORDER — ROPIVACAINE HCL 2 MG/ML IJ SOLN
INTRAMUSCULAR | Status: AC
  Filled 2023-08-11: qty 20

## 2023-08-11 MED ORDER — TRIAMCINOLONE ACETONIDE 40 MG/ML IJ SUSP
INTRAMUSCULAR | Status: AC
Start: 2023-08-11 — End: ?
  Filled 2023-08-11: qty 1

## 2023-08-11 MED ORDER — SODIUM CHLORIDE (PF) 0.9 % IJ SOLN
INTRAMUSCULAR | Status: AC
  Filled 2023-08-11: qty 10

## 2023-08-11 NOTE — Patient Instructions (Signed)

## 2023-08-11 NOTE — Progress Notes (Signed)
 Subjective:  Patient ID: Tina Hayes, female    DOB: 04/30/1945  Age: 78 y.o. MRN: [id number]  CC: Back Pain (lower)   Procedure: None  HPI Tina Hayes presents for reevaluation.  She has continued to have a lot of lower extremity sciatica symptoms.  In the past she has had epidural steroids to help with this and these generally worked very effectively for her generally giving her about 80% relief of her low back pain for about a month and 1/2 to 2 months with almost complete resolution of her sciatica for a 52-month to 19-month period.  She continues to take her primary medications for pain control additionally and these are working well for her.  Otherwise she is in her usual state of health.  No change in bowel or bladder function is noted.  She is still having troubles with weight loss.  Outpatient Medications Prior to Visit  Medication Sig Dispense Refill   acetaminophen (TYLENOL) 500 MG tablet Take 500 mg by mouth every 6 (six) hours as needed for headache, moderate pain or fever.     albuterol (PROVENTIL HFA;VENTOLIN HFA) 108 (90 Base) MCG/ACT inhaler Inhale 2 puffs into the lungs every 6 (six) hours as needed for wheezing or shortness of breath. 1 Inhaler 2   albuterol (PROVENTIL) (2.5 MG/3ML) 0.083% nebulizer solution Take 2.5 mg every 4 (four) hours as needed by nebulization for wheezing or shortness of breath.     amitriptyline (ELAVIL) 25 MG tablet Take 25 mg by mouth at bedtime.     amLODipine (NORVASC) 5 MG tablet Take 1 tablet by mouth once daily 90 tablet 1   atorvastatin (LIPITOR) 40 MG tablet Take 1 tablet (40 mg total) by mouth daily. 90 tablet 3   B-D UF III MINI PEN NEEDLES 31G X 5 MM MISC USE WITH INSULIN PEN INJECTIONS TWICE DAILY     carvedilol (COREG) 3.125 MG tablet Take 1 tablet (3.125 mg total) by mouth 2 (two) times daily with a meal. 90 tablet 1   clopidogrel (PLAVIX) 75 MG tablet Take 75 mg by mouth daily.     divalproex (DEPAKOTE) 250 MG DR tablet Take 1  tablet (250 mg total) by mouth 2 (two) times daily. 60 tablet 11   DUPIXENT 300 MG/2ML SOPN Inject into the skin.     Ferrous Sulfate (IRON) 325 (65 Fe) MG TABS Take 1 tablet by mouth daily.     hydrALAZINE (APRESOLINE) 50 MG tablet Take 1 tablet (50 mg total) by mouth 3 (three) times daily as needed (Systolic BP greater than 150). 100 tablet 0   HYDROcodone-acetaminophen (NORCO) 7.5-325 MG tablet Take 1 tablet by mouth every 6 (six) hours as needed for moderate pain (pain score 4-6) or severe pain (pain score 7-10). 120 tablet 0   [START ON 09/02/2023] HYDROcodone-acetaminophen (NORCO) 7.5-325 MG tablet Take 1 tablet by mouth every 6 (six) hours as needed for moderate pain (pain score 4-6) or severe pain (pain score 7-10). 120 tablet 0   insulin aspart (NOVOLOG) 100 UNIT/ML FlexPen Inject 8 units subQ if blood sugar is consistently over 300 for more than 6 hours despite taking your other prescribed insulin. Repeat up to three times a day (every 8 hours) until blood sugars improve. 15 mL 0   LANTUS SOLOSTAR 100 UNIT/ML Solostar Pen Inject 10 Units into the skin See admin instructions. Inject 30 units subcutaneously in the morning at 35 units subcutaneously at bedtime. 15 mL 11   levETIRAcetam (KEPPRA) 750  MG tablet Take 2 tablets (1,500 mg total) by mouth 2 (two) times daily. 120 tablet 11   lipase/protease/amylase (CREON) 36000 UNITS CPEP capsule Take 1 capsule (36,000 Units total) by mouth 3 (three) times daily before meals for 14 days. 100 capsule 0   losartan (COZAAR) 100 MG tablet Take 1 tablet by mouth once daily 90 tablet 3   montelukast (SINGULAIR) 10 MG tablet Take 10 mg by mouth daily.     naloxone (NARCAN) nasal spray 4 mg/0.1 mL As directed for opioid induced respiratory depression 1 each 1   nitroGLYCERIN (NITROSTAT) 0.4 MG SL tablet DISSOLVE ONE TABLET UNDER THE TONGUE EVERY 5 MINUTES AS NEEDED FOR CHEST PAIN.  DO NOT EXCEED A TOTAL OF 3 DOSES IN 15 MINUTES 25 tablet 0   pantoprazole  (PROTONIX) 40 MG tablet Take 40 mg by mouth 2 (two) times daily.     sertraline (ZOLOFT) 100 MG tablet Take 150 mg by mouth daily.     sucralfate (CARAFATE) 1 GM/10ML suspension Take 10 mLs (1 g total) by mouth 2 (two) times daily. 600 mL 0   tiotropium (SPIRIVA) 18 MCG inhalation capsule Place 18 mcg into inhaler and inhale daily.     traZODone (DESYREL) 50 MG tablet Take 50 mg by mouth at bedtime.     No facility-administered medications prior to visit.    Review of Systems CNS: No confusion or sedation Cardiac: No angina or palpitations GI: No abdominal pain or constipation Constitutional: No nausea vomiting fevers or chills  Objective:  BP (!) 224/79   Pulse (!) 57   Temp (!) 97.3 F (36.3 C) (Temporal)   Resp 16   Ht 5\' 11"  (1.803 m)   Wt 285 lb (129.3 kg)   SpO2 99%   BMI 39.75 kg/m    BP Readings from Last 3 Encounters:  08/10/23 (!) 224/79  07/31/23 (!) 147/74  04/06/23 (!) 163/133     Wt Readings from Last 3 Encounters:  08/10/23 285 lb (129.3 kg)  07/28/23 280 lb (127 kg)  04/06/23 265 lb (120.2 kg)     Physical Exam Pt is alert and oriented PERRL EOMI HEART IS RRR no murmur or rub LCTA no wheezing or rales MUSCULOSKELETAL reveals some paraspinous muscle tenderness and a positive straight leg raise on the right side negative on the left.  She does have diffuse tenderness throughout the lumbar region with no overt trigger points noted today.  She is in a wheelchair for ambulatory assistance.  Labs  Lab Results  Component Value Date   HGBA1C 7.2 (H) 07/29/2023   HGBA1C 8.7 (H) 01/02/2023   HGBA1C 7.7 (H) 02/24/2021   Lab Results  Component Value Date   LDLCALC 58 01/30/2017   CREATININE 0.61 07/30/2023    -------------------------------------------------------------------------------------------------------------------- Lab Results  Component Value Date   WBC 4.1 07/30/2023   HGB 10.4 (L) 07/30/2023   HCT 32.3 (L) 07/30/2023   PLT 203  07/30/2023   GLUCOSE 119 (H) 07/30/2023   CHOL 120 01/30/2017   TRIG 73 01/30/2017   HDL 47 01/30/2017   LDLCALC 58 01/30/2017   ALT 16 07/28/2023   AST 18 07/28/2023   NA 145 07/30/2023   K 3.6 07/30/2023   CL 112 (H) 07/30/2023   CREATININE 0.61 07/30/2023   BUN 13 07/30/2023   CO2 25 07/30/2023   TSH 2.640 07/13/2020   INR 1.0 01/02/2023   HGBA1C 7.2 (H) 07/29/2023    --------------------------------------------------------------------------------------------------------------------- No results found.   Assessment & Plan:  Tina Hayes was seen today for back pain.  Diagnoses and all orders for this visit:  Chronic pain syndrome  Bilateral sciatica -     Lumbar Epidural Injection  Spinal stenosis of lumbar region with neurogenic claudication -     Lumbar Epidural Injection  Lumbar spondylosis with myelopathy -     Lumbar Epidural Injection  Chronic, continuous use of opioids  Degeneration of intervertebral disc of lumbar region with discogenic back pain  Facet arthritis of lumbosacral region  Cervicogenic headache  Morbid obesity (HCC)        ----------------------------------------------------------------------------------------------------------------------  Problem List Items Addressed This Visit       Unprioritized   Headache   Morbid obesity (HCC)   Other Visit Diagnoses       Chronic pain syndrome    -  Primary     Bilateral sciatica         Spinal stenosis of lumbar region with neurogenic claudication         Lumbar spondylosis with myelopathy         Chronic, continuous use of opioids         Degeneration of intervertebral disc of lumbar region with discogenic back pain         Facet arthritis of lumbosacral region             ----------------------------------------------------------------------------------------------------------------------  1. Bilateral sciatica As reviewed today I think is appropriate to proceed with a repeat  epidural injection.  We gone over the risks and benefits of the procedure with her in full detail.  Unfortunately we will be unable to proceed secondary to elevated blood pressures with diastolics over 110.  We have requested that she go home take her medication and recheck her blood pressure and she is also due for follow-up with her primary care physician.  At this point she denies any problems with headaches or other issues but I have encouraged her to take her blood pressure at home and if it remains elevated to present to the emergency room for evaluation and assistance.  Will schedule her for return and injection tomorrow if her blood pressure is better. - Lumbar Epidural Injection  2. Spinal stenosis of lumbar region with neurogenic claudication As above - Lumbar Epidural Injection  3. Lumbar spondylosis with myelopathy  - Lumbar Epidural Injection  4. Chronic pain syndrome (Primary)   5. Chronic, continuous use of opioids   6. Degeneration of intervertebral disc of lumbar region with discogenic back pain   7. Facet arthritis of lumbosacral region   8. Cervicogenic headache   9. Morbid obesity (HCC) Continue follow-up with primary care physician for baseline medical care    ----------------------------------------------------------------------------------------------------------------------  I am having Tina Hayes. Tina Hayes maintain her tiotropium, traZODone, albuterol, albuterol, B-D UF III MINI PEN NEEDLES, acetaminophen, Dupixent, sertraline, amitriptyline, montelukast, atorvastatin, nitroGLYCERIN, amLODipine, insulin aspart, Iron, Lantus SoloStar, naloxone, carvedilol, losartan, clopidogrel, pantoprazole, divalproex, levETIRAcetam, lipase/protease/amylase, sucralfate, hydrALAZINE, HYDROcodone-acetaminophen, and HYDROcodone-acetaminophen.   No orders of the defined types were placed in this encounter.  Patient's Medications  New Prescriptions   No medications on file   Previous Medications   ACETAMINOPHEN (TYLENOL) 500 MG TABLET    Take 500 mg by mouth every 6 (six) hours as needed for headache, moderate pain or fever.   ALBUTEROL (PROVENTIL HFA;VENTOLIN HFA) 108 (90 BASE) MCG/ACT INHALER    Inhale 2 puffs into the lungs every 6 (six) hours as needed for wheezing or shortness of breath.   ALBUTEROL (PROVENTIL) (2.5 MG/3ML) 0.083%  NEBULIZER SOLUTION    Take 2.5 mg every 4 (four) hours as needed by nebulization for wheezing or shortness of breath.   AMITRIPTYLINE (ELAVIL) 25 MG TABLET    Take 25 mg by mouth at bedtime.   AMLODIPINE (NORVASC) 5 MG TABLET    Take 1 tablet by mouth once daily   ATORVASTATIN (LIPITOR) 40 MG TABLET    Take 1 tablet (40 mg total) by mouth daily.   B-D UF III MINI PEN NEEDLES 31G X 5 MM MISC    USE WITH INSULIN PEN INJECTIONS TWICE DAILY   CARVEDILOL (COREG) 3.125 MG TABLET    Take 1 tablet (3.125 mg total) by mouth 2 (two) times daily with a meal.   CLOPIDOGREL (PLAVIX) 75 MG TABLET    Take 75 mg by mouth daily.   DIVALPROEX (DEPAKOTE) 250 MG DR TABLET    Take 1 tablet (250 mg total) by mouth 2 (two) times daily.   DUPIXENT 300 MG/2ML SOPN    Inject into the skin.   FERROUS SULFATE (IRON) 325 (65 FE) MG TABS    Take 1 tablet by mouth daily.   HYDRALAZINE (APRESOLINE) 50 MG TABLET    Take 1 tablet (50 mg total) by mouth 3 (three) times daily as needed (Systolic BP greater than 150).   HYDROCODONE-ACETAMINOPHEN (NORCO) 7.5-325 MG TABLET    Take 1 tablet by mouth every 6 (six) hours as needed for moderate pain (pain score 4-6) or severe pain (pain score 7-10).   HYDROCODONE-ACETAMINOPHEN (NORCO) 7.5-325 MG TABLET    Take 1 tablet by mouth every 6 (six) hours as needed for moderate pain (pain score 4-6) or severe pain (pain score 7-10).   INSULIN ASPART (NOVOLOG) 100 UNIT/ML FLEXPEN    Inject 8 units subQ if blood sugar is consistently over 300 for more than 6 hours despite taking your other prescribed insulin. Repeat up to three times a day  (every 8 hours) until blood sugars improve.   LANTUS SOLOSTAR 100 UNIT/ML SOLOSTAR PEN    Inject 10 Units into the skin See admin instructions. Inject 30 units subcutaneously in the morning at 35 units subcutaneously at bedtime.   LEVETIRACETAM (KEPPRA) 750 MG TABLET    Take 2 tablets (1,500 mg total) by mouth 2 (two) times daily.   LIPASE/PROTEASE/AMYLASE (CREON) 36000 UNITS CPEP CAPSULE    Take 1 capsule (36,000 Units total) by mouth 3 (three) times daily before meals for 14 days.   LOSARTAN (COZAAR) 100 MG TABLET    Take 1 tablet by mouth once daily   MONTELUKAST (SINGULAIR) 10 MG TABLET    Take 10 mg by mouth daily.   NALOXONE (NARCAN) NASAL SPRAY 4 MG/0.1 ML    As directed for opioid induced respiratory depression   NITROGLYCERIN (NITROSTAT) 0.4 MG SL TABLET    DISSOLVE ONE TABLET UNDER THE TONGUE EVERY 5 MINUTES AS NEEDED FOR CHEST PAIN.  DO NOT EXCEED A TOTAL OF 3 DOSES IN 15 MINUTES   PANTOPRAZOLE (PROTONIX) 40 MG TABLET    Take 40 mg by mouth 2 (two) times daily.   SERTRALINE (ZOLOFT) 100 MG TABLET    Take 150 mg by mouth daily.   SUCRALFATE (CARAFATE) 1 GM/10ML SUSPENSION    Take 10 mLs (1 g total) by mouth 2 (two) times daily.   TIOTROPIUM (SPIRIVA) 18 MCG INHALATION CAPSULE    Place 18 mcg into inhaler and inhale daily.   TRAZODONE (DESYREL) 50 MG TABLET    Take 50 mg by mouth  at bedtime.  Modified Medications   No medications on file  Discontinued Medications   No medications on file   ----------------------------------------------------------------------------------------------------------------------  Follow-up: Return in about 1 month (around 09/09/2023) for evaluation, med refill.    Zula Hitch, MD

## 2023-08-12 ENCOUNTER — Encounter: Payer: Self-pay | Admitting: Anesthesiology

## 2023-08-12 MED ORDER — LIDOCAINE HCL (PF) 1 % IJ SOLN
5.0000 mL | Freq: Once | INTRAMUSCULAR | Status: AC
  Administered 2023-08-11: 5 mL via SUBCUTANEOUS

## 2023-08-12 MED ORDER — SODIUM CHLORIDE 0.9% FLUSH
10.0000 mL | Freq: Once | INTRAVENOUS | Status: AC
  Administered 2023-08-12: 10 mL

## 2023-08-12 MED ORDER — MIDAZOLAM HCL 2 MG/2ML IJ SOLN
2.0000 mg | Freq: Once | INTRAMUSCULAR | Status: AC
Start: 2023-08-12 — End: 2023-08-11
  Administered 2023-08-11: 2 mg via INTRAVENOUS

## 2023-08-12 MED ORDER — LACTATED RINGERS IV SOLN: INTRAVENOUS | Status: AC

## 2023-08-12 MED ORDER — ROPIVACAINE HCL 2 MG/ML IJ SOLN
10.0000 mL | Freq: Once | INTRAMUSCULAR | Status: AC
  Administered 2023-08-11: 10 mL via EPIDURAL

## 2023-08-12 MED ORDER — IOHEXOL 180 MG/ML  SOLN
10.0000 mL | Freq: Once | INTRAMUSCULAR | Status: AC | PRN
  Administered 2023-08-12: 10 mL via EPIDURAL

## 2023-08-12 MED ORDER — TRIAMCINOLONE ACETONIDE 40 MG/ML IJ SUSP
40.0000 mg | Freq: Once | INTRAMUSCULAR | Status: AC
  Administered 2023-08-11: 40 mg

## 2023-08-12 NOTE — Addendum Note (Signed)
 Addended by: Zula Hitch on: 08/12/2023 09:06 AM   Modules accepted: Orders

## 2023-08-12 NOTE — Progress Notes (Signed)
 Subjective:  Patient ID: Tina Hayes, female    DOB: 04-Jul-1945  Age: 78 y.o. MRN: [id number]  CC: Back Pain (lower)   Procedure: Caudal epidural steroid under fluoroscopic guidance with minimal sedation  HPI Tina Hayes presents for caudal epidural.  She was seen yesterday and has her blood pressure under better control.  She desires to proceed with caudal today.  No other changes are noted.  Outpatient Medications Prior to Visit  Medication Sig Dispense Refill   acetaminophen (TYLENOL) 500 MG tablet Take 500 mg by mouth every 6 (six) hours as needed for headache, moderate pain or fever.     albuterol (PROVENTIL HFA;VENTOLIN HFA) 108 (90 Base) MCG/ACT inhaler Inhale 2 puffs into the lungs every 6 (six) hours as needed for wheezing or shortness of breath. 1 Inhaler 2   albuterol (PROVENTIL) (2.5 MG/3ML) 0.083% nebulizer solution Take 2.5 mg every 4 (four) hours as needed by nebulization for wheezing or shortness of breath.     amitriptyline (ELAVIL) 25 MG tablet Take 25 mg by mouth at bedtime.     amLODipine (NORVASC) 5 MG tablet Take 1 tablet by mouth once daily 90 tablet 1   atorvastatin (LIPITOR) 40 MG tablet Take 1 tablet (40 mg total) by mouth daily. 90 tablet 3   B-D UF III MINI PEN NEEDLES 31G X 5 MM MISC USE WITH INSULIN PEN INJECTIONS TWICE DAILY     carvedilol (COREG) 3.125 MG tablet Take 1 tablet (3.125 mg total) by mouth 2 (two) times daily with a meal. 90 tablet 1   clopidogrel (PLAVIX) 75 MG tablet Take 75 mg by mouth daily.     divalproex (DEPAKOTE) 250 MG DR tablet Take 1 tablet (250 mg total) by mouth 2 (two) times daily. 60 tablet 11   DUPIXENT 300 MG/2ML SOPN Inject into the skin.     Ferrous Sulfate (IRON) 325 (65 Fe) MG TABS Take 1 tablet by mouth daily.     hydrALAZINE (APRESOLINE) 50 MG tablet Take 1 tablet (50 mg total) by mouth 3 (three) times daily as needed (Systolic BP greater than 150). 100 tablet 0   HYDROcodone-acetaminophen (NORCO) 7.5-325 MG tablet  Take 1 tablet by mouth every 6 (six) hours as needed for moderate pain (pain score 4-6) or severe pain (pain score 7-10). 120 tablet 0   [START ON 09/02/2023] HYDROcodone-acetaminophen (NORCO) 7.5-325 MG tablet Take 1 tablet by mouth every 6 (six) hours as needed for moderate pain (pain score 4-6) or severe pain (pain score 7-10). 120 tablet 0   insulin aspart (NOVOLOG) 100 UNIT/ML FlexPen Inject 8 units subQ if blood sugar is consistently over 300 for more than 6 hours despite taking your other prescribed insulin. Repeat up to three times a day (every 8 hours) until blood sugars improve. 15 mL 0   LANTUS SOLOSTAR 100 UNIT/ML Solostar Pen Inject 10 Units into the skin See admin instructions. Inject 30 units subcutaneously in the morning at 35 units subcutaneously at bedtime. 15 mL 11   levETIRAcetam (KEPPRA) 750 MG tablet Take 2 tablets (1,500 mg total) by mouth 2 (two) times daily. 120 tablet 11   lipase/protease/amylase (CREON) 36000 UNITS CPEP capsule Take 1 capsule (36,000 Units total) by mouth 3 (three) times daily before meals for 14 days. 100 capsule 0   losartan (COZAAR) 100 MG tablet Take 1 tablet by mouth once daily 90 tablet 3   montelukast (SINGULAIR) 10 MG tablet Take 10 mg by mouth daily.  naloxone (NARCAN) nasal spray 4 mg/0.1 mL As directed for opioid induced respiratory depression 1 each 1   nitroGLYCERIN (NITROSTAT) 0.4 MG SL tablet DISSOLVE ONE TABLET UNDER THE TONGUE EVERY 5 MINUTES AS NEEDED FOR CHEST PAIN.  DO NOT EXCEED A TOTAL OF 3 DOSES IN 15 MINUTES 25 tablet 0   pantoprazole (PROTONIX) 40 MG tablet Take 40 mg by mouth 2 (two) times daily.     sertraline (ZOLOFT) 100 MG tablet Take 150 mg by mouth daily.     sucralfate (CARAFATE) 1 GM/10ML suspension Take 10 mLs (1 g total) by mouth 2 (two) times daily. 600 mL 0   tiotropium (SPIRIVA) 18 MCG inhalation capsule Place 18 mcg into inhaler and inhale daily.     traZODone (DESYREL) 50 MG tablet Take 50 mg by mouth at bedtime.      No facility-administered medications prior to visit.    Review of Systems CNS: No confusion or sedation Cardiac: No angina or palpitations GI: No abdominal pain or constipation Constitutional: No nausea vomiting fevers or chills  Objective:  BP (!) 178/109 Comment: reg size cuff lower left arm, talked with pt about increased BP  Pulse (!) 102   Temp (!) 97.2 F (36.2 C)   Resp 20   Ht 5\' 11"  (1.803 m)   Wt 285 lb (129.3 kg)   SpO2 99%   BMI 39.75 kg/m    BP Readings from Last 3 Encounters:  08/11/23 (!) 178/109  08/10/23 (!) 224/79  07/31/23 (!) 147/74     Wt Readings from Last 3 Encounters:  08/11/23 285 lb (129.3 kg)  08/10/23 285 lb (129.3 kg)  07/28/23 280 lb (127 kg)     Physical Exam Pt is alert and oriented PERRL EOMI HEART IS RRR no murmur or rub LCTA no wheezing or rales MUSCULOSKELETAL no change since yesterday.  Labs  Lab Results  Component Value Date   HGBA1C 7.2 (H) 07/29/2023   HGBA1C 8.7 (H) 01/02/2023   HGBA1C 7.7 (H) 02/24/2021   Lab Results  Component Value Date   LDLCALC 58 01/30/2017   CREATININE 0.61 07/30/2023    -------------------------------------------------------------------------------------------------------------------- Lab Results  Component Value Date   WBC 4.1 07/30/2023   HGB 10.4 (L) 07/30/2023   HCT 32.3 (L) 07/30/2023   PLT 203 07/30/2023   GLUCOSE 119 (H) 07/30/2023   CHOL 120 01/30/2017   TRIG 73 01/30/2017   HDL 47 01/30/2017   LDLCALC 58 01/30/2017   ALT 16 07/28/2023   AST 18 07/28/2023   NA 145 07/30/2023   K 3.6 07/30/2023   CL 112 (H) 07/30/2023   CREATININE 0.61 07/30/2023   BUN 13 07/30/2023   CO2 25 07/30/2023   TSH 2.640 07/13/2020   INR 1.0 01/02/2023   HGBA1C 7.2 (H) 07/29/2023    --------------------------------------------------------------------------------------------------------------------- DG PAIN CLINIC C-ARM 1-60 MIN NO REPORT Result Date: 08/11/2023 Fluoro was used, but  no Radiologist interpretation will be provided. Please refer to "NOTES" tab for provider progress note.    Assessment & Plan:   There are no diagnoses linked to this encounter.      ----------------------------------------------------------------------------------------------------------------------  Problem List Items Addressed This Visit   None     ----------------------------------------------------------------------------------------------------------------------  There are no diagnoses linked to this encounter.   ----------------------------------------------------------------------------------------------------------------------  I am having Jayme Meuse. Burgert maintain her tiotropium, traZODone, albuterol, albuterol, B-D UF III MINI PEN NEEDLES, acetaminophen, Dupixent, sertraline, amitriptyline, montelukast, atorvastatin, nitroGLYCERIN, amLODipine, insulin aspart, Iron, Lantus SoloStar, naloxone, carvedilol, losartan, clopidogrel, pantoprazole, divalproex,  levETIRAcetam, lipase/protease/amylase, sucralfate, hydrALAZINE, HYDROcodone-acetaminophen, and HYDROcodone-acetaminophen.   No orders of the defined types were placed in this encounter.  Patient's Medications  New Prescriptions   No medications on file  Previous Medications   ACETAMINOPHEN (TYLENOL) 500 MG TABLET    Take 500 mg by mouth every 6 (six) hours as needed for headache, moderate pain or fever.   ALBUTEROL (PROVENTIL HFA;VENTOLIN HFA) 108 (90 BASE) MCG/ACT INHALER    Inhale 2 puffs into the lungs every 6 (six) hours as needed for wheezing or shortness of breath.   ALBUTEROL (PROVENTIL) (2.5 MG/3ML) 0.083% NEBULIZER SOLUTION    Take 2.5 mg every 4 (four) hours as needed by nebulization for wheezing or shortness of breath.   AMITRIPTYLINE (ELAVIL) 25 MG TABLET    Take 25 mg by mouth at bedtime.   AMLODIPINE (NORVASC) 5 MG TABLET    Take 1 tablet by mouth once daily   ATORVASTATIN (LIPITOR) 40 MG TABLET    Take 1  tablet (40 mg total) by mouth daily.   B-D UF III MINI PEN NEEDLES 31G X 5 MM MISC    USE WITH INSULIN PEN INJECTIONS TWICE DAILY   CARVEDILOL (COREG) 3.125 MG TABLET    Take 1 tablet (3.125 mg total) by mouth 2 (two) times daily with a meal.   CLOPIDOGREL (PLAVIX) 75 MG TABLET    Take 75 mg by mouth daily.   DIVALPROEX (DEPAKOTE) 250 MG DR TABLET    Take 1 tablet (250 mg total) by mouth 2 (two) times daily.   DUPIXENT 300 MG/2ML SOPN    Inject into the skin.   FERROUS SULFATE (IRON) 325 (65 FE) MG TABS    Take 1 tablet by mouth daily.   HYDRALAZINE (APRESOLINE) 50 MG TABLET    Take 1 tablet (50 mg total) by mouth 3 (three) times daily as needed (Systolic BP greater than 150).   HYDROCODONE-ACETAMINOPHEN (NORCO) 7.5-325 MG TABLET    Take 1 tablet by mouth every 6 (six) hours as needed for moderate pain (pain score 4-6) or severe pain (pain score 7-10).   HYDROCODONE-ACETAMINOPHEN (NORCO) 7.5-325 MG TABLET    Take 1 tablet by mouth every 6 (six) hours as needed for moderate pain (pain score 4-6) or severe pain (pain score 7-10).   INSULIN ASPART (NOVOLOG) 100 UNIT/ML FLEXPEN    Inject 8 units subQ if blood sugar is consistently over 300 for more than 6 hours despite taking your other prescribed insulin. Repeat up to three times a day (every 8 hours) until blood sugars improve.   LANTUS SOLOSTAR 100 UNIT/ML SOLOSTAR PEN    Inject 10 Units into the skin See admin instructions. Inject 30 units subcutaneously in the morning at 35 units subcutaneously at bedtime.   LEVETIRACETAM (KEPPRA) 750 MG TABLET    Take 2 tablets (1,500 mg total) by mouth 2 (two) times daily.   LIPASE/PROTEASE/AMYLASE (CREON) 36000 UNITS CPEP CAPSULE    Take 1 capsule (36,000 Units total) by mouth 3 (three) times daily before meals for 14 days.   LOSARTAN (COZAAR) 100 MG TABLET    Take 1 tablet by mouth once daily   MONTELUKAST (SINGULAIR) 10 MG TABLET    Take 10 mg by mouth daily.   NALOXONE (NARCAN) NASAL SPRAY 4 MG/0.1 ML    As  directed for opioid induced respiratory depression   NITROGLYCERIN (NITROSTAT) 0.4 MG SL TABLET    DISSOLVE ONE TABLET UNDER THE TONGUE EVERY 5 MINUTES AS NEEDED FOR CHEST PAIN.  DO NOT EXCEED A TOTAL OF 3 DOSES IN 15 MINUTES   PANTOPRAZOLE (PROTONIX) 40 MG TABLET    Take 40 mg by mouth 2 (two) times daily.   SERTRALINE (ZOLOFT) 100 MG TABLET    Take 150 mg by mouth daily.   SUCRALFATE (CARAFATE) 1 GM/10ML SUSPENSION    Take 10 mLs (1 g total) by mouth 2 (two) times daily.   TIOTROPIUM (SPIRIVA) 18 MCG INHALATION CAPSULE    Place 18 mcg into inhaler and inhale daily.   TRAZODONE (DESYREL) 50 MG TABLET    Take 50 mg by mouth at bedtime.  Modified Medications   No medications on file  Discontinued Medications   No medications on file   ----------------------------------------------------------------------------------------------------------------------  Follow-up: No follow-ups on file.  1. 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 Lumbar spondylosis with myelopathy   4. Chronic pain syndrome   5. Chronic, continuous use of opioids   6. Degeneration of intervertebral disc of lumbar region with discogenic back pain   7. Facet arthritis of lumbosacral region   8. Cervicogenic headache   9. Morbid obesity (HCC)    As reviewed yesterday we will proceed with a caudal epidural today.  We have gone over the risks and benefits of the procedure with her in full detail.  I will schedule her for return in 1 month.  Continue current medication management.  Her blood pressure is under better control and it is clear for injection today.  After informed consent was obtained and the risks benefits reviewed patient chose to pursue a caudal epidural steroid injection today. With the patient in the prone position and an IV in place, sedation was with IV 1 mg versed. Using AP and lateral fluoroscopic guidance I identified the area overlying the sacral hiatus.  This area was broadly prepped with DuraPrep 3 and we utilized strict aseptic technique during the procedure. 1% lidocaine was infiltrated 2 cc using a 25-gauge needle overlying the sacral cornu and then using lateral fluoroscopic guidance I advanced an 18-gauge Touhy needle approximately 2 cm through the sacral hiatus. Confirmation was with 2 cc of contrast dye yielding good epidural spread and no evidence of IV or subarachnoid uptake. This was followed by an injection of 5 cc of saline mixed with 1 cc of ropivacaine 0.2% and 40 mg of triamcinolone. This was tolerated without difficulty the patient was convalesced and discharged home in stable condition for follow-up as mentioned. Leata Providence, MD

## 2023-08-13 LAB — TOXASSURE SELECT 13 (MW), URINE

## 2023-10-05 ENCOUNTER — Telehealth: Payer: Self-pay

## 2023-10-05 ENCOUNTER — Encounter: Payer: Self-pay | Admitting: Anesthesiology

## 2023-10-05 ENCOUNTER — Ambulatory Visit: Attending: Anesthesiology | Admitting: Anesthesiology

## 2023-10-05 DIAGNOSIS — F1190 Opioid use, unspecified, uncomplicated: Secondary | ICD-10-CM

## 2023-10-05 DIAGNOSIS — M47817 Spondylosis without myelopathy or radiculopathy, lumbosacral region: Secondary | ICD-10-CM

## 2023-10-05 DIAGNOSIS — M5432 Sciatica, left side: Secondary | ICD-10-CM

## 2023-10-05 DIAGNOSIS — G4486 Cervicogenic headache: Secondary | ICD-10-CM

## 2023-10-05 DIAGNOSIS — G894 Chronic pain syndrome: Secondary | ICD-10-CM

## 2023-10-05 DIAGNOSIS — M48062 Spinal stenosis, lumbar region with neurogenic claudication: Secondary | ICD-10-CM

## 2023-10-05 DIAGNOSIS — M5431 Sciatica, right side: Secondary | ICD-10-CM

## 2023-10-05 DIAGNOSIS — M4716 Other spondylosis with myelopathy, lumbar region: Secondary | ICD-10-CM

## 2023-10-05 DIAGNOSIS — M170 Bilateral primary osteoarthritis of knee: Secondary | ICD-10-CM

## 2023-10-05 DIAGNOSIS — M5136 Other intervertebral disc degeneration, lumbar region with discogenic back pain only: Secondary | ICD-10-CM

## 2023-10-05 DIAGNOSIS — M25512 Pain in left shoulder: Secondary | ICD-10-CM

## 2023-10-05 MED ORDER — HYDROCODONE-ACETAMINOPHEN 7.5-325 MG PO TABS: 1.0000 | ORAL_TABLET | Freq: Four times a day (QID) | ORAL | 0 refills | Status: AC | PRN

## 2023-10-05 NOTE — Telephone Encounter (Signed)
 Tried to call patient several times, left message for patient to call back for appointment.

## 2023-10-05 NOTE — Telephone Encounter (Signed)
 Can you put her on for a phone visit today?

## 2023-10-05 NOTE — Telephone Encounter (Signed)
 Telephone call from patient wanting a refill of her pain medication sent to [facility]. Patient states she has been out for a few days.

## 2023-10-05 NOTE — Progress Notes (Signed)
 I tried to reach out to Tina Hayes for her virtual visit.  2 messages were left on her recorder.  I have scheduled her for a 2-week return to clinic and filled her medication due today with return to clinic scheduled in 2 weeks.

## 2023-10-19 ENCOUNTER — Encounter: Payer: Self-pay | Admitting: Anesthesiology

## 2023-10-19 ENCOUNTER — Ambulatory Visit: Attending: Anesthesiology | Admitting: Anesthesiology

## 2023-10-19 DIAGNOSIS — M47817 Spondylosis without myelopathy or radiculopathy, lumbosacral region: Secondary | ICD-10-CM

## 2023-10-19 DIAGNOSIS — G894 Chronic pain syndrome: Secondary | ICD-10-CM | POA: Diagnosis not present

## 2023-10-19 DIAGNOSIS — M5431 Sciatica, right side: Secondary | ICD-10-CM | POA: Diagnosis not present

## 2023-10-19 DIAGNOSIS — M4716 Other spondylosis with myelopathy, lumbar region: Secondary | ICD-10-CM | POA: Diagnosis not present

## 2023-10-19 DIAGNOSIS — F1190 Opioid use, unspecified, uncomplicated: Secondary | ICD-10-CM

## 2023-10-19 DIAGNOSIS — M48062 Spinal stenosis, lumbar region with neurogenic claudication: Secondary | ICD-10-CM

## 2023-10-19 DIAGNOSIS — M5432 Sciatica, left side: Secondary | ICD-10-CM | POA: Diagnosis not present

## 2023-10-19 DIAGNOSIS — G4486 Cervicogenic headache: Secondary | ICD-10-CM

## 2023-10-19 DIAGNOSIS — M5136 Other intervertebral disc degeneration, lumbar region with discogenic back pain only: Secondary | ICD-10-CM

## 2023-10-19 MED ORDER — HYDROCODONE-ACETAMINOPHEN 10-325 MG PO TABS: 1.0000 | ORAL_TABLET | Freq: Four times a day (QID) | ORAL | 0 refills | Status: DC | PRN

## 2023-10-19 NOTE — Progress Notes (Signed)
 Virtual Visit via Telephone Note  I connected with Tina Hayes on 10/19/23 at  1:00 PM EDT by telephone and verified that I am speaking with the correct person using two identifiers.  Location: Patient: Home Provider: [facility] control center   I discussed the limitations, risks, security and privacy concerns of performing an evaluation and management service by telephone and the availability of in person appointments. I also discussed with the patient that there may be a patient responsible charge related to this service. The patient expressed understanding and agreed to proceed.   History of Present Illness: I spoke with Tina Hayes via telephone as we are unable link for the video portion of the conference.  She reports that she has responded well to her last caudal epidural a few months ago.  She had 5 weeks of 70 to 80% resolution of the low back pain and something similar for her lower extremity pain.  She has had some recurrence of that pain.  She generally requires an epidural about every 2 to 3 months secondary to the severity of her spinal stenosis.  She is still taking her 7.5 mg hydrocodone  tablets 4 times a day and these worked well for her but they are less effective as of recent.  Unfortunately she has been unable to stay particularly active and has continued to have problems with weight gain in addition to some other chronic medical conditions.  The medications that she is taking for pain continue to work well for her but her but they are less effective than before.  Otherwise she is in her usual state of health at this time.  She is describing no change in lower extremity strength or function bowel or bladder function of the pain that she does have has been stable in nature with no changes in quality characteristic or distribution.  Review of systems: General: No fevers or chills Pulmonary: No shortness of breath or dyspnea Cardiac: No angina or palpitations or lightheadedness GI:  No abdominal pain or constipation Psych: No depression    Observations/Objective:  Current Outpatient Medications:    [START ON 11/04/2023] HYDROcodone -acetaminophen  (NORCO) 10-325 MG tablet, Take 1 tablet by mouth every 6 (six) hours as needed for moderate pain (pain score 4-6) or severe pain (pain score 7-10)., Disp: 120 tablet, Rfl: 0   acetaminophen  (TYLENOL ) 500 MG tablet, Take 500 mg by mouth every 6 (six) hours as needed for headache, moderate pain or fever., Disp: , Rfl:    albuterol  (PROVENTIL  HFA;VENTOLIN  HFA) 108 (90 Base) MCG/ACT inhaler, Inhale 2 puffs into the lungs every 6 (six) hours as needed for wheezing or shortness of breath., Disp: 1 Inhaler, Rfl: 2   albuterol  (PROVENTIL ) (2.5 MG/3ML) 0.083% nebulizer solution, Take 2.5 mg every 4 (four) hours as needed by nebulization for wheezing or shortness of breath., Disp: , Rfl:    amitriptyline (ELAVIL) 25 MG tablet, Take 25 mg by mouth at bedtime., Disp: , Rfl:    amLODipine  (NORVASC ) 5 MG tablet, Take 1 tablet by mouth once daily, Disp: 90 tablet, Rfl: 1   atorvastatin  (LIPITOR) 40 MG tablet, Take 1 tablet (40 mg total) by mouth daily., Disp: 90 tablet, Rfl: 3   B-D UF III MINI PEN NEEDLES 31G X 5 MM MISC, USE WITH INSULIN  PEN INJECTIONS TWICE DAILY, Disp: , Rfl:    carvedilol  (COREG ) 3.125 MG tablet, Take 1 tablet (3.125 mg total) by mouth 2 (two) times daily with a meal., Disp: 90 tablet, Rfl: 1  clopidogrel  (PLAVIX ) 75 MG tablet, Take 75 mg by mouth daily., Disp: , Rfl:    divalproex  (DEPAKOTE ) 250 MG DR tablet, Take 1 tablet (250 mg total) by mouth 2 (two) times daily., Disp: 60 tablet, Rfl: 11   DUPIXENT 300 MG/2ML SOPN, Inject into the skin., Disp: , Rfl:    Ferrous Sulfate  (IRON ) 325 (65 Fe) MG TABS, Take 1 tablet by mouth daily., Disp: , Rfl:    hydrALAZINE  (APRESOLINE ) 50 MG tablet, Take 1 tablet (50 mg total) by mouth 3 (three) times daily as needed (Systolic BP greater than 150)., Disp: 100 tablet, Rfl: 0    HYDROcodone -acetaminophen  (NORCO) 7.5-325 MG tablet, Take 1 tablet by mouth every 6 (six) hours as needed for moderate pain (pain score 4-6) or severe pain (pain score 7-10)., Disp: 120 tablet, Rfl: 0   insulin  aspart (NOVOLOG ) 100 UNIT/ML FlexPen, Inject 8 units subQ if blood sugar is consistently over 300 for more than 6 hours despite taking your other prescribed insulin . Repeat up to three times a day (every 8 hours) until blood sugars improve., Disp: 15 mL, Rfl: 0   LANTUS  SOLOSTAR 100 UNIT/ML Solostar Pen, Inject 10 Units into the skin See admin instructions. Inject 30 units subcutaneously in the morning at 35 units subcutaneously at bedtime., Disp: 15 mL, Rfl: 11   levETIRAcetam  (KEPPRA ) 750 MG tablet, Take 2 tablets (1,500 mg total) by mouth 2 (two) times daily., Disp: 120 tablet, Rfl: 11   losartan  (COZAAR ) 100 MG tablet, Take 1 tablet by mouth once daily, Disp: 90 tablet, Rfl: 3   montelukast  (SINGULAIR ) 10 MG tablet, Take 10 mg by mouth daily., Disp: , Rfl:    naloxone  (NARCAN ) nasal spray 4 mg/0.1 mL, As directed for opioid induced respiratory depression, Disp: 1 each, Rfl: 1   nitroGLYCERIN  (NITROSTAT ) 0.4 MG SL tablet, DISSOLVE ONE TABLET UNDER THE TONGUE EVERY 5 MINUTES AS NEEDED FOR CHEST PAIN.  DO NOT EXCEED A TOTAL OF 3 DOSES IN 15 MINUTES, Disp: 25 tablet, Rfl: 0   pantoprazole  (PROTONIX ) 40 MG tablet, Take 40 mg by mouth 2 (two) times daily., Disp: , Rfl:    sertraline  (ZOLOFT ) 100 MG tablet, Take 150 mg by mouth daily., Disp: , Rfl:    sucralfate  (CARAFATE ) 1 GM/10ML suspension, Take 10 mLs (1 g total) by mouth 2 (two) times daily., Disp: 600 mL, Rfl: 0   tiotropium (SPIRIVA ) 18 MCG inhalation capsule, Place 18 mcg into inhaler and inhale daily., Disp: , Rfl:    traZODone  (DESYREL ) 50 MG tablet, Take 50 mg by mouth at bedtime., Disp: , Rfl:  No current facility-administered medications for this visit.  Facility-Administered Medications Ordered in Other Visits:    lactated ringers   infusion, , Intravenous, Continuous, Myra Lynwood MATSU, MD, Last Rate: 125 mL/hr at 08/12/23 1001, New Bag at 08/12/23 1001   Past Medical History:  Diagnosis Date   (HFpEF) heart failure with preserved ejection fraction (HCC)    a. 05/2016 Echo: EF 60-65%, mild to mod LVH, Gr1 DD, mild MR, mildly dil LA, mod TR, mildly to mod increased PASP; b. 07/2020 Echo: EF 55-60%, no rwma, mild LVH, GrIDD, mildly dil LA, mild MR, mild-mod TR, mild-mod AoV sclerosis w/o stenosis.   Anxiety    Arthritis    Chronic back pain    COPD (chronic obstructive pulmonary disease) (HCC)    Coronary artery disease    a. s/p remote PCI x 5;  b. 2006 s/p CABG x 3 ([place] [place] [facility]);  b. 05/2016 MV: attenuation corrected images w/o ischemia or wma-->Med rx; c. 06/2020 MV: EF 55-65%, no isch/infart-->low risk.   Depression    Diabetes mellitus without complication (HCC)    Essential hypertension 06/30/2016   GERD (gastroesophageal reflux disease)    Heart attack (HCC)    Total of 3 per pt.   Hyperlipidemia LDL goal <55    Hypertensive urgency 06/03/2015   PAD (peripheral artery disease) (HCC)    a. 06/2019 Angio: R AT (PTA), R POP (PTA & Viabahn stenting); b. 09/2020 ABIs: R 1.24, L 1.35.   Palpitations    a. 06/2020 Zio: Sinus brady, 54 (40-143), 4 beats NSVT, 3 SVT runs (max 143 bpm x 4 beats).   Seizure (HCC)    Assessment and Plan:  1. 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 Lumbar spondylosis with myelopathy   4. Chronic pain syndrome   5. Chronic, continuous use of opioids   6. Degeneration of intervertebral disc of lumbar region with discogenic back pain   7. Facet arthritis of lumbosacral region   8. Cervicogenic headache   9. Morbid obesity (HCC)    Based on the [place]  practitioner database information it is appropriate for refill.  She recently for refill on her 7.5 mg tablet.  Secondary to the severity of her pain and the  persistent nature I am going to increase her to the 10 mg strength and her next refill in 1 month.  In the meantime I am going to schedule her for repeat caudal epidural steroid as she has responded favorably to these in the past but unfortunately has failed more conservative therapy with physical therapy and or conservative measures.  Continue efforts at weight loss and optimize activity as tolerated with stretching strengthening exercises.  Continue follow-up with her primary care physicians for baseline medical care with both cardiology and nephrology. Follow Up Instructions:    I discussed the assessment and treatment plan with the patient. The patient was provided an opportunity to ask questions and all were answered. The patient agreed with the plan and demonstrated an understanding of the instructions.   The patient was advised to call back or seek an in-person evaluation if the symptoms worsen or if the condition fails to improve as anticipated.  I provided 30 minutes of non-face-to-face time during this encounter.   Lynwood KANDICE Clause, MD

## 2023-11-16 ENCOUNTER — Other Ambulatory Visit: Payer: Self-pay | Admitting: Cardiovascular Disease

## 2023-12-01 ENCOUNTER — Encounter: Payer: Self-pay | Admitting: Anesthesiology

## 2023-12-01 ENCOUNTER — Ambulatory Visit (HOSPITAL_BASED_OUTPATIENT_CLINIC_OR_DEPARTMENT_OTHER): Admitting: Anesthesiology

## 2023-12-01 ENCOUNTER — Ambulatory Visit
Admission: RE | Admit: 2023-12-01 | Discharge: 2023-12-01 | Disposition: A | Discharge status: Home / Self Care | Source: Ambulatory Visit | Attending: Anesthesiology | Admitting: Anesthesiology

## 2023-12-01 ENCOUNTER — Other Ambulatory Visit: Payer: Self-pay | Admitting: Anesthesiology

## 2023-12-01 VITALS — BP 162/81 | HR 76 | Temp 97.6°F | Resp 16 | Ht 71.0 in | Wt 300.0 lb

## 2023-12-01 DIAGNOSIS — M5432 Sciatica, left side: Secondary | ICD-10-CM | POA: Diagnosis present

## 2023-12-01 DIAGNOSIS — M5136 Other intervertebral disc degeneration, lumbar region with discogenic back pain only: Secondary | ICD-10-CM | POA: Insufficient documentation

## 2023-12-01 DIAGNOSIS — M48062 Spinal stenosis, lumbar region with neurogenic claudication: Secondary | ICD-10-CM | POA: Insufficient documentation

## 2023-12-01 DIAGNOSIS — M4716 Other spondylosis with myelopathy, lumbar region: Secondary | ICD-10-CM | POA: Diagnosis present

## 2023-12-01 DIAGNOSIS — F1190 Opioid use, unspecified, uncomplicated: Secondary | ICD-10-CM | POA: Insufficient documentation

## 2023-12-01 DIAGNOSIS — R52 Pain, unspecified: Secondary | ICD-10-CM

## 2023-12-01 DIAGNOSIS — G894 Chronic pain syndrome: Secondary | ICD-10-CM | POA: Insufficient documentation

## 2023-12-01 DIAGNOSIS — M5431 Sciatica, right side: Secondary | ICD-10-CM | POA: Insufficient documentation

## 2023-12-01 DIAGNOSIS — M25512 Pain in left shoulder: Secondary | ICD-10-CM

## 2023-12-01 MED ORDER — MIDAZOLAM HCL 2 MG/2ML IJ SOLN: 2.0000 mg | Freq: Once | INTRAMUSCULAR | Status: DC

## 2023-12-01 MED ORDER — HYDROCODONE-ACETAMINOPHEN 10-325 MG PO TABS: 1.0000 | ORAL_TABLET | Freq: Four times a day (QID) | ORAL | 0 refills | Status: AC | PRN

## 2023-12-01 MED ORDER — SODIUM CHLORIDE (PF) 0.9 % IJ SOLN
INTRAMUSCULAR | Status: AC
Start: 2023-12-01 — End: 2023-12-01
  Filled 2023-12-01: qty 10

## 2023-12-01 MED ORDER — LIDOCAINE HCL (PF) 1 % IJ SOLN
INTRAMUSCULAR | Status: AC
  Filled 2023-12-01: qty 5

## 2023-12-01 MED ORDER — IOHEXOL 180 MG/ML  SOLN
INTRAMUSCULAR | Status: AC
  Filled 2023-12-01: qty 20

## 2023-12-01 MED ORDER — MIDAZOLAM HCL 2 MG/2ML IJ SOLN
INTRAMUSCULAR | Status: AC
  Filled 2023-12-01: qty 2

## 2023-12-01 MED ORDER — TRIAMCINOLONE ACETONIDE 40 MG/ML IJ SUSP
INTRAMUSCULAR | Status: AC
  Filled 2023-12-01: qty 1

## 2023-12-01 MED ORDER — SODIUM CHLORIDE 0.9% FLUSH: 10.0000 mL | Freq: Once | INTRAVENOUS | Status: DC

## 2023-12-01 MED ORDER — IOHEXOL 180 MG/ML  SOLN: 10.0000 mL | Freq: Once | INTRAMUSCULAR | Status: DC | PRN

## 2023-12-01 MED ORDER — ROPIVACAINE HCL 2 MG/ML IJ SOLN: 10.0000 mL | Freq: Once | INTRAMUSCULAR | Status: DC

## 2023-12-01 MED ORDER — ROPIVACAINE HCL 2 MG/ML IJ SOLN
INTRAMUSCULAR | Status: AC
Start: 2023-12-01 — End: 2023-12-01
  Filled 2023-12-01: qty 20

## 2023-12-01 MED ORDER — LIDOCAINE HCL (PF) 1 % IJ SOLN: 5.0000 mL | Freq: Once | INTRAMUSCULAR | Status: DC

## 2023-12-01 MED ORDER — LACTATED RINGERS IV SOLN: INTRAVENOUS | Status: DC

## 2023-12-01 MED ORDER — TRIAMCINOLONE ACETONIDE 40 MG/ML IJ SUSP: 40.0000 mg | Freq: Once | INTRAMUSCULAR | Status: DC

## 2023-12-01 MED ORDER — SODIUM CHLORIDE (PF) 0.9 % IJ SOLN
INTRAMUSCULAR | Status: AC
  Filled 2023-12-01: qty 10

## 2023-12-01 NOTE — Patient Instructions (Signed)

## 2023-12-01 NOTE — Progress Notes (Signed)
 Subjective:  Patient ID: Tina Hayes, female    DOB: 1945-05-17  Age: 78 y.o. MRN: [id number]  CC: Back Pain (lower)   Procedure: L5-S1 lumbar epidural steroid and fluoroscopic guidance with minimal sedation  HPI Tina Hayes presents for reevaluation.  Tina Hayes continues to have bilateral sciatica symptoms and chronic intractable low back pain despite medication management.  She feels the pain has been worse over the past 2 weeks.  She is requesting an epidural steroid injection today.  She does have some giveway weakness and has been unable to lose any weight effectively.  Outpatient Medications Prior to Visit  Medication Sig Dispense Refill   acetaminophen  (TYLENOL ) 500 MG tablet Take 500 mg by mouth every 6 (six) hours as needed for headache, moderate pain or fever.     albuterol  (PROVENTIL  HFA;VENTOLIN  HFA) 108 (90 Base) MCG/ACT inhaler Inhale 2 puffs into the lungs every 6 (six) hours as needed for wheezing or shortness of breath. 1 Inhaler 2   albuterol  (PROVENTIL ) (2.5 MG/3ML) 0.083% nebulizer solution Take 2.5 mg every 4 (four) hours as needed by nebulization for wheezing or shortness of breath.     amitriptyline (ELAVIL) 25 MG tablet Take 25 mg by mouth at bedtime.     amLODipine  (NORVASC ) 5 MG tablet Take 1 tablet by mouth once daily 90 tablet 1   atorvastatin  (LIPITOR) 40 MG tablet Take 1 tablet (40 mg total) by mouth daily. 90 tablet 3   B-D UF III MINI PEN NEEDLES 31G X 5 MM MISC USE WITH INSULIN  PEN INJECTIONS TWICE DAILY     carvedilol  (COREG ) 3.125 MG tablet TAKE 1 TABLET BY MOUTH TWICE DAILY WITH MEALS 180 tablet 1   clopidogrel  (PLAVIX ) 75 MG tablet Take 75 mg by mouth daily.     divalproex  (DEPAKOTE ) 250 MG DR tablet Take 1 tablet (250 mg total) by mouth 2 (two) times daily. 60 tablet 11   DUPIXENT 300 MG/2ML SOPN Inject into the skin.     Ferrous Sulfate  (IRON ) 325 (65 Fe) MG TABS Take 1 tablet by mouth daily.     hydrALAZINE  (APRESOLINE ) 50 MG tablet Take 1 tablet  (50 mg total) by mouth 3 (three) times daily as needed (Systolic BP greater than 150). 100 tablet 0   HYDROcodone -acetaminophen  (NORCO) 10-325 MG tablet Take 1 tablet by mouth every 6 (six) hours as needed for moderate pain (pain score 4-6) or severe pain (pain score 7-10). 120 tablet 0   insulin  aspart (NOVOLOG ) 100 UNIT/ML FlexPen Inject 8 units subQ if blood sugar is consistently over 300 for more than 6 hours despite taking your other prescribed insulin . Repeat up to three times a day (every 8 hours) until blood sugars improve. 15 mL 0   LANTUS  SOLOSTAR 100 UNIT/ML Solostar Pen Inject 10 Units into the skin See admin instructions. Inject 30 units subcutaneously in the morning at 35 units subcutaneously at bedtime. 15 mL 11   levETIRAcetam  (KEPPRA ) 750 MG tablet Take 2 tablets (1,500 mg total) by mouth 2 (two) times daily. 120 tablet 11   losartan  (COZAAR ) 100 MG tablet Take 1 tablet by mouth once daily 90 tablet 3   montelukast  (SINGULAIR ) 10 MG tablet Take 10 mg by mouth daily.     naloxone  (NARCAN ) nasal spray 4 mg/0.1 mL As directed for opioid induced respiratory depression 1 each 1   nitroGLYCERIN  (NITROSTAT ) 0.4 MG SL tablet DISSOLVE ONE TABLET UNDER THE TONGUE EVERY 5 MINUTES AS NEEDED FOR CHEST PAIN.  DO NOT  EXCEED A TOTAL OF 3 DOSES IN 15 MINUTES 25 tablet 0   pantoprazole  (PROTONIX ) 40 MG tablet Take 40 mg by mouth 2 (two) times daily.     sertraline  (ZOLOFT ) 100 MG tablet Take 150 mg by mouth daily.     sucralfate  (CARAFATE ) 1 GM/10ML suspension Take 10 mLs (1 g total) by mouth 2 (two) times daily. 600 mL 0   tiotropium (SPIRIVA ) 18 MCG inhalation capsule Place 18 mcg into inhaler and inhale daily.     traZODone  (DESYREL ) 50 MG tablet Take 50 mg by mouth at bedtime.     Facility-Administered Medications Prior to Visit  Medication Dose Route Frequency Provider Last Rate Last Admin   lactated ringers  infusion   Intravenous Continuous Myra Lynwood MATSU, MD 125 mL/hr at 08/12/23 1001 New Bag  at 08/12/23 1001    Review of Systems CNS: No confusion or sedation Cardiac: No angina or palpitations GI: No abdominal pain or constipation Constitutional: No nausea vomiting fevers or chills  Objective:  BP (!) 162/81   Pulse 76   Temp 97.6 F (36.4 C) (Temporal)   Resp 16   Ht 5' 11 (1.803 m)   Wt 300 lb (136.1 kg)   SpO2 98%   BMI 41.84 kg/m    BP Readings from Last 3 Encounters:  12/01/23 (!) 162/81  08/11/23 (!) 178/109  08/10/23 (!) 224/79     Wt Readings from Last 3 Encounters:  12/01/23 300 lb (136.1 kg)  08/11/23 285 lb (129.3 kg)  08/10/23 285 lb (129.3 kg)     Physical Exam Pt is alert and oriented PERRL EOMI HEART IS RRR no murmur or rub LCTA no wheezing or rales MUSCULOSKELETAL reveals some paraspinous muscle tenderness but no overt trigger points on examination today.  Her muscle tone and bulk is at baseline.  She continues to have an antalgic gait and uses a walker or stroller for ambulatory assistance.  Labs  Lab Results  Component Value Date   HGBA1C 7.2 (H) 07/29/2023   HGBA1C 8.7 (H) 01/02/2023   HGBA1C 7.7 (H) 02/24/2021   Lab Results  Component Value Date   LDLCALC 58 01/30/2017   CREATININE 0.61 07/30/2023    -------------------------------------------------------------------------------------------------------------------- Lab Results  Component Value Date   WBC 4.1 07/30/2023   HGB 10.4 (L) 07/30/2023   HCT 32.3 (L) 07/30/2023   PLT 203 07/30/2023   GLUCOSE 119 (H) 07/30/2023   CHOL 120 01/30/2017   TRIG 73 01/30/2017   HDL 47 01/30/2017   LDLCALC 58 01/30/2017   ALT 16 07/28/2023   AST 18 07/28/2023   NA 145 07/30/2023   K 3.6 07/30/2023   CL 112 (H) 07/30/2023   CREATININE 0.61 07/30/2023   BUN 13 07/30/2023   CO2 25 07/30/2023   TSH 2.640 07/13/2020   INR 1.0 01/02/2023   HGBA1C 7.2 (H) 07/29/2023     --------------------------------------------------------------------------------------------------------------------- DG PAIN CLINIC C-ARM 1-60 MIN NO REPORT Result Date: 12/01/2023 Fluoro was used, but no Radiologist interpretation will be provided. Please refer to NOTES tab for provider progress note.    Assessment & Plan:   Mattia was seen today for back pain.  Diagnoses and all orders for this visit:  Bilateral sciatica -     triamcinolone  acetonide (KENALOG -40) injection 40 mg -     sodium chloride  flush (NS) 0.9 % injection 10 mL -     ropivacaine  (PF) 2 mg/mL (0.2%) (NAROPIN ) injection 10 mL -     midazolam  (VERSED ) injection 2 mg -  lidocaine  (PF) (XYLOCAINE ) 1 % injection 5 mL -     lactated ringers  infusion -     iohexol  (OMNIPAQUE ) 180 MG/ML injection 10 mL -     Lumbar Epidural Injection  Spinal stenosis of lumbar region with neurogenic claudication -     triamcinolone  acetonide (KENALOG -40) injection 40 mg -     sodium chloride  flush (NS) 0.9 % injection 10 mL -     ropivacaine  (PF) 2 mg/mL (0.2%) (NAROPIN ) injection 10 mL -     midazolam  (VERSED ) injection 2 mg -     lidocaine  (PF) (XYLOCAINE ) 1 % injection 5 mL -     lactated ringers  infusion -     iohexol  (OMNIPAQUE ) 180 MG/ML injection 10 mL -     Lumbar Epidural Injection  Lumbar spondylosis with myelopathy -     Lumbar Epidural Injection  Chronic pain syndrome  Chronic, continuous use of opioids  Degeneration of intervertebral disc of lumbar region with discogenic back pain  Morbid obesity (HCC)  Pain in joint of left shoulder        ----------------------------------------------------------------------------------------------------------------------  Problem List Items Addressed This Visit       Unprioritized   Morbid obesity (HCC)   Other Visit Diagnoses       Bilateral sciatica    -  Primary   Relevant Medications   triamcinolone  acetonide (KENALOG -40) injection 40 mg (Start  on 12/01/2023  5:15 PM)   sodium chloride  flush (NS) 0.9 % injection 10 mL (Start on 12/01/2023  5:15 PM)   ropivacaine  (PF) 2 mg/mL (0.2%) (NAROPIN ) injection 10 mL (Start on 12/01/2023  5:15 PM)   midazolam  (VERSED ) injection 2 mg (Start on 12/01/2023  5:15 PM)   lidocaine  (PF) (XYLOCAINE ) 1 % injection 5 mL (Start on 12/01/2023  5:15 PM)   lactated ringers  infusion (Start on 12/01/2023  5:15 PM)   iohexol  (OMNIPAQUE ) 180 MG/ML injection 10 mL     Spinal stenosis of lumbar region with neurogenic claudication       Relevant Medications   triamcinolone  acetonide (KENALOG -40) injection 40 mg (Start on 12/01/2023  5:15 PM)   sodium chloride  flush (NS) 0.9 % injection 10 mL (Start on 12/01/2023  5:15 PM)   ropivacaine  (PF) 2 mg/mL (0.2%) (NAROPIN ) injection 10 mL (Start on 12/01/2023  5:15 PM)   midazolam  (VERSED ) injection 2 mg (Start on 12/01/2023  5:15 PM)   lidocaine  (PF) (XYLOCAINE ) 1 % injection 5 mL (Start on 12/01/2023  5:15 PM)   lactated ringers  infusion (Start on 12/01/2023  5:15 PM)   iohexol  (OMNIPAQUE ) 180 MG/ML injection 10 mL     Lumbar spondylosis with myelopathy         Chronic pain syndrome       Relevant Medications   triamcinolone  acetonide (KENALOG -40) injection 40 mg (Start on 12/01/2023  5:15 PM)   ropivacaine  (PF) 2 mg/mL (0.2%) (NAROPIN ) injection 10 mL (Start on 12/01/2023  5:15 PM)   lidocaine  (PF) (XYLOCAINE ) 1 % injection 5 mL (Start on 12/01/2023  5:15 PM)     Chronic, continuous use of opioids         Degeneration of intervertebral disc of lumbar region with discogenic back pain       Relevant Medications   triamcinolone  acetonide (KENALOG -40) injection 40 mg (Start on 12/01/2023  5:15 PM)     Pain in joint of left shoulder             ----------------------------------------------------------------------------------------------------------------------  1. Bilateral sciatica (Primary) Will proceed  with a repeat epidural injection today.  We have gone over the risks and benefits of  the procedure with her in full detail all her questions were answered.  I want her to continue with core stretching strengthening exercises as tolerated.  Will schedule her for return evaluation in 1 month.  Fortunately, she has responded favorably to epidural management in the past and has injections every few months to keep her pain under good control.  She has failed more conservative therapy with home physical therapy being of limited benefit and the medication management assist with chronic opioid therapy it is insufficient at this point but generally more effective following the epidurals. - triamcinolone  acetonide (KENALOG -40) injection 40 mg - sodium chloride  flush (NS) 0.9 % injection 10 mL - ropivacaine  (PF) 2 mg/mL (0.2%) (NAROPIN ) injection 10 mL - midazolam  (VERSED ) injection 2 mg - lidocaine  (PF) (XYLOCAINE ) 1 % injection 5 mL - lactated ringers  infusion - iohexol  (OMNIPAQUE ) 180 MG/ML injection 10 mL - Lumbar Epidural Injection  2. Spinal stenosis of lumbar region with neurogenic claudication As above - triamcinolone  acetonide (KENALOG -40) injection 40 mg - sodium chloride  flush (NS) 0.9 % injection 10 mL - ropivacaine  (PF) 2 mg/mL (0.2%) (NAROPIN ) injection 10 mL - midazolam  (VERSED ) injection 2 mg - lidocaine  (PF) (XYLOCAINE ) 1 % injection 5 mL - lactated ringers  infusion - iohexol  (OMNIPAQUE ) 180 MG/ML injection 10 mL - Lumbar Epidural Injection  3. Lumbar spondylosis with myelopathy As above - Lumbar Epidural Injection  4. Chronic pain syndrome I have reviewed the [place]  practitioner database information is appropriate to continue opioid therapy.  5. Chronic, continuous use of opioids I have reviewed the [place]  practitioner database information appropriate for refill today.  This will be for August 8  6. Degeneration of intervertebral disc of lumbar region with discogenic back pain As above  7. Morbid obesity (HCC)   8. Pain in joint of left  shoulder     ----------------------------------------------------------------------------------------------------------------------  I am having Tina HERO. Ozdemir maintain her tiotropium, traZODone , albuterol , albuterol , B-D UF III MINI PEN NEEDLES, acetaminophen , Dupixent, sertraline , amitriptyline, montelukast , atorvastatin , nitroGLYCERIN , amLODipine , insulin  aspart, Iron , Lantus  SoloStar, naloxone , losartan , clopidogrel , pantoprazole , divalproex , levETIRAcetam , sucralfate , hydrALAZINE , HYDROcodone -acetaminophen , and carvedilol .   Meds ordered this encounter  Medications   triamcinolone  acetonide (KENALOG -40) injection 40 mg   sodium chloride  flush (NS) 0.9 % injection 10 mL   ropivacaine  (PF) 2 mg/mL (0.2%) (NAROPIN ) injection 10 mL   midazolam  (VERSED ) injection 2 mg   lidocaine  (PF) (XYLOCAINE ) 1 % injection 5 mL   lactated ringers  infusion   iohexol  (OMNIPAQUE ) 180 MG/ML injection 10 mL   Patient's Medications  New Prescriptions   No medications on file  Previous Medications   ACETAMINOPHEN  (TYLENOL ) 500 MG TABLET    Take 500 mg by mouth every 6 (six) hours as needed for headache, moderate pain or fever.   ALBUTEROL  (PROVENTIL  HFA;VENTOLIN  HFA) 108 (90 BASE) MCG/ACT INHALER    Inhale 2 puffs into the lungs every 6 (six) hours as needed for wheezing or shortness of breath.   ALBUTEROL  (PROVENTIL ) (2.5 MG/3ML) 0.083% NEBULIZER SOLUTION    Take 2.5 mg every 4 (four) hours as needed by nebulization for wheezing or shortness of breath.   AMITRIPTYLINE (ELAVIL) 25 MG TABLET    Take 25 mg by mouth at bedtime.   AMLODIPINE  (NORVASC ) 5 MG TABLET    Take 1 tablet by mouth once daily   ATORVASTATIN  (LIPITOR) 40 MG TABLET    Take 1 tablet (40  mg total) by mouth daily.   B-D UF III MINI PEN NEEDLES 31G X 5 MM MISC    USE WITH INSULIN  PEN INJECTIONS TWICE DAILY   CARVEDILOL  (COREG ) 3.125 MG TABLET    TAKE 1 TABLET BY MOUTH TWICE DAILY WITH MEALS   CLOPIDOGREL  (PLAVIX ) 75 MG TABLET    Take 75 mg  by mouth daily.   DIVALPROEX  (DEPAKOTE ) 250 MG DR TABLET    Take 1 tablet (250 mg total) by mouth 2 (two) times daily.   DUPIXENT 300 MG/2ML SOPN    Inject into the skin.   FERROUS SULFATE  (IRON ) 325 (65 FE) MG TABS    Take 1 tablet by mouth daily.   HYDRALAZINE  (APRESOLINE ) 50 MG TABLET    Take 1 tablet (50 mg total) by mouth 3 (three) times daily as needed (Systolic BP greater than 150).   HYDROCODONE -ACETAMINOPHEN  (NORCO) 10-325 MG TABLET    Take 1 tablet by mouth every 6 (six) hours as needed for moderate pain (pain score 4-6) or severe pain (pain score 7-10).   INSULIN  ASPART (NOVOLOG ) 100 UNIT/ML FLEXPEN    Inject 8 units subQ if blood sugar is consistently over 300 for more than 6 hours despite taking your other prescribed insulin . Repeat up to three times a day (every 8 hours) until blood sugars improve.   LANTUS  SOLOSTAR 100 UNIT/ML SOLOSTAR PEN    Inject 10 Units into the skin See admin instructions. Inject 30 units subcutaneously in the morning at 35 units subcutaneously at bedtime.   LEVETIRACETAM  (KEPPRA ) 750 MG TABLET    Take 2 tablets (1,500 mg total) by mouth 2 (two) times daily.   LOSARTAN  (COZAAR ) 100 MG TABLET    Take 1 tablet by mouth once daily   MONTELUKAST  (SINGULAIR ) 10 MG TABLET    Take 10 mg by mouth daily.   NALOXONE  (NARCAN ) NASAL SPRAY 4 MG/0.1 ML    As directed for opioid induced respiratory depression   NITROGLYCERIN  (NITROSTAT ) 0.4 MG SL TABLET    DISSOLVE ONE TABLET UNDER THE TONGUE EVERY 5 MINUTES AS NEEDED FOR CHEST PAIN.  DO NOT EXCEED A TOTAL OF 3 DOSES IN 15 MINUTES   PANTOPRAZOLE  (PROTONIX ) 40 MG TABLET    Take 40 mg by mouth 2 (two) times daily.   SERTRALINE  (ZOLOFT ) 100 MG TABLET    Take 150 mg by mouth daily.   SUCRALFATE  (CARAFATE ) 1 GM/10ML SUSPENSION    Take 10 mLs (1 g total) by mouth 2 (two) times daily.   TIOTROPIUM (SPIRIVA ) 18 MCG INHALATION CAPSULE    Place 18 mcg into inhaler and inhale daily.   TRAZODONE  (DESYREL ) 50 MG TABLET    Take 50 mg by  mouth at bedtime.  Modified Medications   No medications on file  Discontinued Medications   No medications on file   ----------------------------------------------------------------------------------------------------------------------  Follow-up: No follow-ups on file.   Procedure: L5-S1 LESI with fluoroscopic guidance and minimal  sedation  NOTE: The risks, benefits, and expectations of the procedure have been discussed and explained to the patient who was understanding and in agreement with suggested treatment plan. No guarantees were made.  DESCRIPTION OF PROCEDURE: Lumbar epidural steroid injection with 2 mg IV Versed , EKG, blood pressure, pulse, and pulse oximetry monitoring. The procedure was performed with the patient in the prone position under fluoroscopic guidance.  Sterile prep x3 was initiated and I then injected subcutaneous lidocaine  to the overlying L5-S1 site after its fluoroscopic identifictation.  Using strict aseptic technique, I then  advanced an 18-gauge Tuohy epidural needle in the midline using interlaminar approach via loss-of-resistance to saline technique. There was negative aspiration for heme or  CSF.  I then confirmed position with both AP and Lateral fluoroscan.  2 cc of contrast dye were injected and a  total of 5 mL of Preservative-Free normal saline mixed with 40 mg of Kenalog  and 1cc Ropicaine 0.2 percent were injected incrementally via the  epidurally placed needle. The needle was removed. The patient tolerated the injection well and was convalesced and discharged to home in stable condition. Should the patient have any post procedure difficulty they have been instructed on how to contact us  for assistance.   Lynwood KANDICE Clause, MD

## 2023-12-02 ENCOUNTER — Telehealth: Payer: Self-pay

## 2023-12-02 NOTE — Telephone Encounter (Signed)
 Post procedure follow up.  LM

## 2024-01-04 ENCOUNTER — Encounter: Payer: Self-pay | Admitting: Anesthesiology

## 2024-01-04 ENCOUNTER — Ambulatory Visit: Attending: Anesthesiology | Admitting: Anesthesiology

## 2024-01-04 DIAGNOSIS — M4716 Other spondylosis with myelopathy, lumbar region: Secondary | ICD-10-CM | POA: Diagnosis not present

## 2024-01-04 DIAGNOSIS — M48062 Spinal stenosis, lumbar region with neurogenic claudication: Secondary | ICD-10-CM

## 2024-01-04 DIAGNOSIS — G4486 Cervicogenic headache: Secondary | ICD-10-CM

## 2024-01-04 DIAGNOSIS — M5431 Sciatica, right side: Secondary | ICD-10-CM | POA: Diagnosis not present

## 2024-01-04 DIAGNOSIS — G894 Chronic pain syndrome: Secondary | ICD-10-CM | POA: Diagnosis not present

## 2024-01-04 DIAGNOSIS — M5136 Other intervertebral disc degeneration, lumbar region with discogenic back pain only: Secondary | ICD-10-CM

## 2024-01-04 DIAGNOSIS — M47817 Spondylosis without myelopathy or radiculopathy, lumbosacral region: Secondary | ICD-10-CM

## 2024-01-04 DIAGNOSIS — M25512 Pain in left shoulder: Secondary | ICD-10-CM

## 2024-01-04 DIAGNOSIS — M5432 Sciatica, left side: Secondary | ICD-10-CM | POA: Diagnosis not present

## 2024-01-04 DIAGNOSIS — F1190 Opioid use, unspecified, uncomplicated: Secondary | ICD-10-CM

## 2024-01-04 MED ORDER — HYDROCODONE-ACETAMINOPHEN 10-325 MG PO TABS: 1.0000 | ORAL_TABLET | Freq: Four times a day (QID) | ORAL | 0 refills | Status: DC | PRN

## 2024-01-04 MED ORDER — HYDROCODONE-ACETAMINOPHEN 10-325 MG PO TABS: 1.0000 | ORAL_TABLET | Freq: Four times a day (QID) | ORAL | 0 refills | Status: AC | PRN

## 2024-01-06 NOTE — Progress Notes (Signed)
 Virtual Visit via Telephone Note  I connected with Tina CHRISTELLA Hayes on 01/06/24 at  3:00 PM EDT by telephone and verified that I am speaking with the correct person using two identifiers.  Location: Patient: Home Provider: [facility]   I discussed the limitations, risks, security and privacy concerns of performing an evaluation and management service by telephone and the availability of in person appointments. I also discussed with the patient that there may be a patient responsible charge related to this service. The patient expressed understanding and agreed to proceed.   History of Present Illness: I spoke with Tina Hayes via telephone as she was unable to do the video portion of the conference.  She did have an epidural back in August, and a L5-S1 epidural steroid injection.  This worked well for her and her sciatica is under better control.  She still has back pain but this is about 50% better and her leg pain is about 75% better.  She still does have hip knee and some sciatica symptoms but she takes chronic opioid therapy for that which continues to help.  Unfortunately she has failed more conservative therapy but she states that the opioid medications enable her to stay active and functional during the day.  She is morbidly obese and has struggled with weight which limits her activity.  With the medication she can sleep better at night.  No side effects with the medications are reported.  Otherwise she is in her usual state of health but the pain is better following the lumbar epidural.    Review of systems: General: No fevers or chills Pulmonary: No shortness of breath or dyspnea Cardiac: No angina or palpitations or lightheadedness GI: No abdominal pain or constipation Psych: No depression   Observations/Objective:  Current Outpatient Medications:    [START ON 02/03/2024] HYDROcodone -acetaminophen  (NORCO) 10-325 MG tablet, Take 1 tablet by mouth every 6 (six) hours as needed  for moderate pain (pain score 4-6) or severe pain (pain score 7-10)., Disp: 120 tablet, Rfl: 0   acetaminophen  (TYLENOL ) 500 MG tablet, Take 500 mg by mouth every 6 (six) hours as needed for headache, moderate pain or fever., Disp: , Rfl:    albuterol  (PROVENTIL  HFA;VENTOLIN  HFA) 108 (90 Base) MCG/ACT inhaler, Inhale 2 puffs into the lungs every 6 (six) hours as needed for wheezing or shortness of breath., Disp: 1 Inhaler, Rfl: 2   albuterol  (PROVENTIL ) (2.5 MG/3ML) 0.083% nebulizer solution, Take 2.5 mg every 4 (four) hours as needed by nebulization for wheezing or shortness of breath., Disp: , Rfl:    amitriptyline (ELAVIL) 25 MG tablet, Take 25 mg by mouth at bedtime., Disp: , Rfl:    amLODipine  (NORVASC ) 5 MG tablet, Take 1 tablet by mouth once daily, Disp: 90 tablet, Rfl: 1   atorvastatin  (LIPITOR) 40 MG tablet, Take 1 tablet (40 mg total) by mouth daily., Disp: 90 tablet, Rfl: 3   B-D UF III MINI PEN NEEDLES 31G X 5 MM MISC, USE WITH INSULIN  PEN INJECTIONS TWICE DAILY, Disp: , Rfl:    carvedilol  (COREG ) 3.125 MG tablet, TAKE 1 TABLET BY MOUTH TWICE DAILY WITH MEALS, Disp: 180 tablet, Rfl: 1   clopidogrel  (PLAVIX ) 75 MG tablet, Take 75 mg by mouth daily., Disp: , Rfl:    divalproex  (DEPAKOTE ) 250 MG DR tablet, Take 1 tablet (250 mg total) by mouth 2 (two) times daily., Disp: 60 tablet, Rfl: 11   DUPIXENT 300 MG/2ML SOPN, Inject into the skin., Disp: , Rfl:  Ferrous Sulfate  (IRON ) 325 (65 Fe) MG TABS, Take 1 tablet by mouth daily., Disp: , Rfl:    hydrALAZINE  (APRESOLINE ) 50 MG tablet, Take 1 tablet (50 mg total) by mouth 3 (three) times daily as needed (Systolic BP greater than 150)., Disp: 100 tablet, Rfl: 0   HYDROcodone -acetaminophen  (NORCO) 10-325 MG tablet, Take 1 tablet by mouth every 6 (six) hours as needed for moderate pain (pain score 4-6) or severe pain (pain score 7-10)., Disp: 120 tablet, Rfl: 0   insulin  aspart (NOVOLOG ) 100 UNIT/ML FlexPen, Inject 8 units subQ if blood sugar is  consistently over 300 for more than 6 hours despite taking your other prescribed insulin . Repeat up to three times a day (every 8 hours) until blood sugars improve., Disp: 15 mL, Rfl: 0   LANTUS  SOLOSTAR 100 UNIT/ML Solostar Pen, Inject 10 Units into the skin See admin instructions. Inject 30 units subcutaneously in the morning at 35 units subcutaneously at bedtime., Disp: 15 mL, Rfl: 11   levETIRAcetam  (KEPPRA ) 750 MG tablet, Take 2 tablets (1,500 mg total) by mouth 2 (two) times daily., Disp: 120 tablet, Rfl: 11   losartan  (COZAAR ) 100 MG tablet, Take 1 tablet by mouth once daily, Disp: 90 tablet, Rfl: 3   montelukast  (SINGULAIR ) 10 MG tablet, Take 10 mg by mouth daily., Disp: , Rfl:    naloxone  (NARCAN ) nasal spray 4 mg/0.1 mL, As directed for opioid induced respiratory depression, Disp: 1 each, Rfl: 1   nitroGLYCERIN  (NITROSTAT ) 0.4 MG SL tablet, DISSOLVE ONE TABLET UNDER THE TONGUE EVERY 5 MINUTES AS NEEDED FOR CHEST PAIN.  DO NOT EXCEED A TOTAL OF 3 DOSES IN 15 MINUTES, Disp: 25 tablet, Rfl: 0   pantoprazole  (PROTONIX ) 40 MG tablet, Take 40 mg by mouth 2 (two) times daily., Disp: , Rfl:    sertraline  (ZOLOFT ) 100 MG tablet, Take 150 mg by mouth daily., Disp: , Rfl:    sucralfate  (CARAFATE ) 1 GM/10ML suspension, Take 10 mLs (1 g total) by mouth 2 (two) times daily., Disp: 600 mL, Rfl: 0   tiotropium (SPIRIVA ) 18 MCG inhalation capsule, Place 18 mcg into inhaler and inhale daily., Disp: , Rfl:    traZODone  (DESYREL ) 50 MG tablet, Take 50 mg by mouth at bedtime., Disp: , Rfl:  No current facility-administered medications for this visit.  Facility-Administered Medications Ordered in Other Visits:    lactated ringers  infusion, , Intravenous, Continuous, Myra Lynwood MATSU, MD, Last Rate: 125 mL/hr at 08/12/23 1001, New Bag at 08/12/23 1001   Past Medical History:  Diagnosis Date   (HFpEF) heart failure with preserved ejection fraction (HCC)    a. 05/2016 Echo: EF 60-65%, mild to mod LVH, Gr1 DD,  mild MR, mildly dil LA, mod TR, mildly to mod increased PASP; b. 07/2020 Echo: EF 55-60%, no rwma, mild LVH, GrIDD, mildly dil LA, mild MR, mild-mod TR, mild-mod AoV sclerosis w/o stenosis.   Anxiety    Arthritis    Chronic back pain    COPD (chronic obstructive pulmonary disease) (HCC)    Coronary artery disease    a. s/p remote PCI x 5;  b. 2006 s/p CABG x 3 ([place] [place] [facility]); b. 05/2016 MV: attenuation corrected images w/o ischemia or wma-->Med rx; c. 06/2020 MV: EF 55-65%, no isch/infart-->low risk.   Depression    Diabetes mellitus without complication (HCC)    Essential hypertension 06/30/2016   GERD (gastroesophageal reflux disease)    Heart attack (HCC)    Total of 3 per pt.  Hyperlipidemia LDL goal <55    Hypertensive urgency 06/03/2015   PAD (peripheral artery disease) (HCC)    a. 06/2019 Angio: R AT (PTA), R POP (PTA & Viabahn stenting); b. 09/2020 ABIs: R 1.24, L 1.35.   Palpitations    a. 06/2020 Zio: Sinus brady, 54 (40-143), 4 beats NSVT, 3 SVT runs (max 143 bpm x 4 beats).   Seizure (HCC)    Assessment and Plan:  1. 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 Lumbar spondylosis with myelopathy   4. Chronic pain syndrome   5. Degeneration of intervertebral disc of lumbar region with discogenic back pain   6. Chronic, continuous use of opioids   7. Morbid obesity (HCC)   8. Pain in joint of left shoulder   9. Facet arthritis of lumbosacral region   10. Cervicogenic headache    Based on our conversation today and after review of the [place]  practitioner database information it is appropriate to refill her medicines for the next 2 months.  This will be for September 8 and October 8 with no other changes in her regimen initiated.  Have encouraged her to continue efforts at weight loss stretching and strengthening etc.  Will schedule her for follow-up in 2 months.  No other changes initiated today.   Continue follow-up with her primary care physicians for baseline medical care. Follow Up Instructions:    I discussed the assessment and treatment plan with the patient. The patient was provided an opportunity to ask questions and all were answered. The patient agreed with the plan and demonstrated an understanding of the instructions.   The patient was advised to call back or seek an in-person evaluation if the symptoms worsen or if the condition fails to improve as anticipated.  I provided 30 minutes of non-face-to-face time during this encounter.   Lynwood KANDICE Clause, MD

## 2024-02-08 ENCOUNTER — Other Ambulatory Visit: Payer: Self-pay | Admitting: Family Medicine

## 2024-02-08 DIAGNOSIS — R413 Other amnesia: Secondary | ICD-10-CM

## 2024-03-02 ENCOUNTER — Encounter: Admitting: Anesthesiology

## 2024-03-03 ENCOUNTER — Encounter: Payer: Self-pay | Admitting: Anesthesiology

## 2024-03-03 ENCOUNTER — Ambulatory Visit: Attending: Anesthesiology | Admitting: Anesthesiology

## 2024-03-03 VITALS — BP 184/70 | HR 58 | Temp 97.8°F | Resp 20 | Ht 71.0 in | Wt 300.0 lb

## 2024-03-03 DIAGNOSIS — M5432 Sciatica, left side: Secondary | ICD-10-CM | POA: Insufficient documentation

## 2024-03-03 DIAGNOSIS — M5136 Other intervertebral disc degeneration, lumbar region with discogenic back pain only: Secondary | ICD-10-CM | POA: Insufficient documentation

## 2024-03-03 DIAGNOSIS — M5431 Sciatica, right side: Secondary | ICD-10-CM | POA: Insufficient documentation

## 2024-03-03 DIAGNOSIS — M47817 Spondylosis without myelopathy or radiculopathy, lumbosacral region: Secondary | ICD-10-CM | POA: Insufficient documentation

## 2024-03-03 DIAGNOSIS — G894 Chronic pain syndrome: Secondary | ICD-10-CM | POA: Insufficient documentation

## 2024-03-03 DIAGNOSIS — M4716 Other spondylosis with myelopathy, lumbar region: Secondary | ICD-10-CM | POA: Diagnosis present

## 2024-03-03 DIAGNOSIS — F1190 Opioid use, unspecified, uncomplicated: Secondary | ICD-10-CM | POA: Insufficient documentation

## 2024-03-03 DIAGNOSIS — M25512 Pain in left shoulder: Secondary | ICD-10-CM | POA: Diagnosis present

## 2024-03-03 DIAGNOSIS — G4486 Cervicogenic headache: Secondary | ICD-10-CM | POA: Insufficient documentation

## 2024-03-03 DIAGNOSIS — M48062 Spinal stenosis, lumbar region with neurogenic claudication: Secondary | ICD-10-CM | POA: Diagnosis present

## 2024-03-03 MED ORDER — HYDROCODONE-ACETAMINOPHEN 10-325 MG PO TABS: 1.0000 | ORAL_TABLET | Freq: Four times a day (QID) | ORAL | 0 refills | Status: AC | PRN

## 2024-03-03 MED ORDER — HYDROCODONE-ACETAMINOPHEN 10-325 MG PO TABS: 1.0000 | ORAL_TABLET | Freq: Four times a day (QID) | ORAL | 0 refills | Status: DC | PRN

## 2024-03-03 NOTE — Progress Notes (Unsigned)
 Nursing Pain Medication Assessment:  Safety precautions to be maintained throughout the outpatient stay will include: orient to surroundings, keep bed in low position, maintain call bell within reach at all times, provide assistance with transfer out of bed and ambulation.  Medication Inspection Compliance: Tina Hayes did not comply with our request to bring her pills to be counted. She was reminded that bringing the medication bottles, even when empty, is a requirement.  Medication: None brought in. Pill/Patch Count: None available to be counted. Bottle Appearance: No container available. Did not bring bottle(s) to appointment. Filled Date: N/A Last Medication intake:  TodaySafety precautions to be maintained throughout the outpatient stay will include: orient to surroundings, keep bed in low position, maintain call bell within reach at all times, provide assistance with transfer out of bed and ambulation.

## 2024-03-03 NOTE — Progress Notes (Unsigned)
 Subjective:  Patient ID: Tina Hayes, female    DOB: Feb 03, 1946  Age: 78 y.o. MRN: [id number]  CC: Back Pain   Procedure: None  HPI Tina Hayes presents for reevaluation.  Tina Hayes continues to have low back pain and diffuse body pain.  She takes her medication to help with pain relief as prescribed.  She uses hydrocodone  10 mg tablets taking these about every 6 hours and generally gets about 70 or 80% relief lasting about 4 to 6 hours.  She reports no side effects with the medication.  She has had previous epidural injections and these generally help with her last injection being back in August.  She has had some control of the sciatica since then and does not feel that she needs another injection at this point.  She tries to do daily stretching strengthening exercises as tolerated.  She is very limited secondary to her living conditions and problems secondary to her weight.  Otherwise she is in her usual state of health denying any change in lower extremity strength or function bowel or bladder function.  Outpatient Medications Prior to Visit  Medication Sig Dispense Refill   acetaminophen  (TYLENOL ) 500 MG tablet Take 500 mg by mouth every 6 (six) hours as needed for headache, moderate pain or fever.     albuterol  (PROVENTIL  HFA;VENTOLIN  HFA) 108 (90 Base) MCG/ACT inhaler Inhale 2 puffs into the lungs every 6 (six) hours as needed for wheezing or shortness of breath. 1 Inhaler 2   albuterol  (PROVENTIL ) (2.5 MG/3ML) 0.083% nebulizer solution Take 2.5 mg every 4 (four) hours as needed by nebulization for wheezing or shortness of breath.     amitriptyline (ELAVIL) 25 MG tablet Take 25 mg by mouth at bedtime.     amLODipine  (NORVASC ) 5 MG tablet Take 1 tablet by mouth once daily 90 tablet 1   atorvastatin  (LIPITOR) 40 MG tablet Take 1 tablet (40 mg total) by mouth daily. 90 tablet 3   B-D UF III MINI PEN NEEDLES 31G X 5 MM MISC USE WITH INSULIN  PEN INJECTIONS TWICE DAILY     carvedilol  (COREG )  3.125 MG tablet TAKE 1 TABLET BY MOUTH TWICE DAILY WITH MEALS 180 tablet 1   clopidogrel  (PLAVIX ) 75 MG tablet Take 75 mg by mouth daily.     divalproex  (DEPAKOTE ) 250 MG DR tablet Take 1 tablet (250 mg total) by mouth 2 (two) times daily. 60 tablet 11   DUPIXENT 300 MG/2ML SOPN Inject into the skin.     Ferrous Sulfate  (IRON ) 325 (65 Fe) MG TABS Take 1 tablet by mouth daily.     hydrALAZINE  (APRESOLINE ) 50 MG tablet Take 1 tablet (50 mg total) by mouth 3 (three) times daily as needed (Systolic BP greater than 150). 100 tablet 0   insulin  aspart (NOVOLOG ) 100 UNIT/ML FlexPen Inject 8 units subQ if blood sugar is consistently over 300 for more than 6 hours despite taking your other prescribed insulin . Repeat up to three times a day (every 8 hours) until blood sugars improve. 15 mL 0   LANTUS  SOLOSTAR 100 UNIT/ML Solostar Pen Inject 10 Units into the skin See admin instructions. Inject 30 units subcutaneously in the morning at 35 units subcutaneously at bedtime. 15 mL 11   levETIRAcetam  (KEPPRA ) 750 MG tablet Take 2 tablets (1,500 mg total) by mouth 2 (two) times daily. 120 tablet 11   losartan  (COZAAR ) 100 MG tablet Take 1 tablet by mouth once daily 90 tablet 3   montelukast  (SINGULAIR ) 10  MG tablet Take 10 mg by mouth daily.     naloxone  (NARCAN ) nasal spray 4 mg/0.1 mL As directed for opioid induced respiratory depression 1 each 1   nitroGLYCERIN  (NITROSTAT ) 0.4 MG SL tablet DISSOLVE ONE TABLET UNDER THE TONGUE EVERY 5 MINUTES AS NEEDED FOR CHEST PAIN.  DO NOT EXCEED A TOTAL OF 3 DOSES IN 15 MINUTES 25 tablet 0   pantoprazole  (PROTONIX ) 40 MG tablet Take 40 mg by mouth 2 (two) times daily.     sertraline  (ZOLOFT ) 100 MG tablet Take 150 mg by mouth daily.     sucralfate  (CARAFATE ) 1 GM/10ML suspension Take 10 mLs (1 g total) by mouth 2 (two) times daily. 600 mL 0   tiotropium (SPIRIVA ) 18 MCG inhalation capsule Place 18 mcg into inhaler and inhale daily.     traZODone  (DESYREL ) 50 MG tablet Take 50  mg by mouth at bedtime.     HYDROcodone -acetaminophen  (NORCO) 10-325 MG tablet Take 1 tablet by mouth every 6 (six) hours as needed for moderate pain (pain score 4-6) or severe pain (pain score 7-10). 120 tablet 0   Facility-Administered Medications Prior to Visit  Medication Dose Route Frequency Provider Last Rate Last Admin   lactated ringers  infusion   Intravenous Continuous Myra Lynwood MATSU, MD 125 mL/hr at 08/12/23 1001 New Bag at 08/12/23 1001    Review of Systems CNS: No confusion or sedation Cardiac: No angina or palpitations GI: No abdominal pain or constipation Constitutional: No nausea vomiting fevers or chills  Objective:  BP (!) 184/70   Pulse (!) 58   Temp 97.8 F (36.6 C)   Resp 20   Ht 5' 11 (1.803 m)   Wt 300 lb (136.1 kg)   SpO2 100%   BMI 41.84 kg/m    BP Readings from Last 3 Encounters:  03/03/24 (!) 184/70  12/01/23 (!) 162/81  08/11/23 (!) 178/109     Wt Readings from Last 3 Encounters:  03/03/24 300 lb (136.1 kg)  12/01/23 300 lb (136.1 kg)  08/11/23 285 lb (129.3 kg)     Physical Exam Pt is alert and oriented PERRL EOMI HEART IS RRR no murmur or rub LCTA no wheezing or rales MUSCULOSKELETAL reveals some paraspinous muscle tenderness in the low back.  She has good muscle tone and bulk similar to what is her baseline.  She is able to extend at the lower legs at the hips without much difficulty extending at the knees without much difficulty but does require ambulatory assistance.  Labs  Lab Results  Component Value Date   HGBA1C 7.2 (H) 07/29/2023   HGBA1C 8.7 (H) 01/02/2023   HGBA1C 7.7 (H) 02/24/2021   Lab Results  Component Value Date   LDLCALC 58 01/30/2017   CREATININE 0.61 07/30/2023    -------------------------------------------------------------------------------------------------------------------- Lab Results  Component Value Date   WBC 4.1 07/30/2023   HGB 10.4 (L) 07/30/2023   HCT 32.3 (L) 07/30/2023   PLT 203  07/30/2023   GLUCOSE 119 (H) 07/30/2023   CHOL 120 01/30/2017   TRIG 73 01/30/2017   HDL 47 01/30/2017   LDLCALC 58 01/30/2017   ALT 16 07/28/2023   AST 18 07/28/2023   NA 145 07/30/2023   K 3.6 07/30/2023   CL 112 (H) 07/30/2023   CREATININE 0.61 07/30/2023   BUN 13 07/30/2023   CO2 25 07/30/2023   TSH 2.640 07/13/2020   INR 1.0 01/02/2023   HGBA1C 7.2 (H) 07/29/2023    --------------------------------------------------------------------------------------------------------------------- DG PAIN CLINIC C-ARM 1-60 MIN  NO REPORT Result Date: 12/01/2023 Fluoro was used, but no Radiologist interpretation will be provided. Please refer to NOTES tab for provider progress note.    Assessment & Plan:   Janann was seen today for back pain.  Diagnoses and all orders for this visit:  Bilateral sciatica  Spinal stenosis of lumbar region with neurogenic claudication  Lumbar spondylosis with myelopathy  Chronic pain syndrome  Degeneration of intervertebral disc of lumbar region with discogenic back pain  Chronic, continuous use of opioids  Morbid obesity (HCC)  Pain in joint of left shoulder  Facet arthritis of lumbosacral region  Cervicogenic headache  Other orders -     HYDROcodone -acetaminophen  (NORCO) 10-325 MG tablet; Take 1 tablet by mouth every 6 (six) hours as needed for moderate pain (pain score 4-6) or severe pain (pain score 7-10). -     Discontinue: HYDROcodone -acetaminophen  (NORCO) 10-325 MG tablet; Take 1 tablet by mouth every 6 (six) hours as needed for moderate pain (pain score 4-6) or severe pain (pain score 7-10). -     HYDROcodone -acetaminophen  (NORCO) 10-325 MG tablet; Take 1 tablet by mouth every 6 (six) hours as needed for moderate pain (pain score 4-6) or severe pain (pain score 7-10).        ----------------------------------------------------------------------------------------------------------------------  Problem List Items Addressed This  Visit       Unprioritized   Headache   Relevant Medications   HYDROcodone -acetaminophen  (NORCO) 10-325 MG tablet (Start on 03/04/2024)   HYDROcodone -acetaminophen  (NORCO) 10-325 MG tablet (Start on 04/03/2024)   Morbid obesity (HCC)   Other Visit Diagnoses       Bilateral sciatica    -  Primary     Spinal stenosis of lumbar region with neurogenic claudication       Relevant Medications   HYDROcodone -acetaminophen  (NORCO) 10-325 MG tablet (Start on 03/04/2024)   HYDROcodone -acetaminophen  (NORCO) 10-325 MG tablet (Start on 04/03/2024)     Lumbar spondylosis with myelopathy         Chronic pain syndrome       Relevant Medications   HYDROcodone -acetaminophen  (NORCO) 10-325 MG tablet (Start on 03/04/2024)   HYDROcodone -acetaminophen  (NORCO) 10-325 MG tablet (Start on 04/03/2024)     Degeneration of intervertebral disc of lumbar region with discogenic back pain       Relevant Medications   HYDROcodone -acetaminophen  (NORCO) 10-325 MG tablet (Start on 03/04/2024)   HYDROcodone -acetaminophen  (NORCO) 10-325 MG tablet (Start on 04/03/2024)     Chronic, continuous use of opioids         Pain in joint of left shoulder         Facet arthritis of lumbosacral region       Relevant Medications   HYDROcodone -acetaminophen  (NORCO) 10-325 MG tablet (Start on 03/04/2024)   HYDROcodone -acetaminophen  (NORCO) 10-325 MG tablet (Start on 04/03/2024)         ----------------------------------------------------------------------------------------------------------------------  1. Bilateral sciatica (Primary) Continue current medication management and efforts at stretching strengthening.  Continue efforts at weight loss.  2. Spinal stenosis of lumbar region with neurogenic claudication As above  3. Lumbar spondylosis with myelopathy As above  4. Chronic pain syndrome I have reviewed the [place]  practitioner database information is appropriate for refill for November 7 and December 7.  Will defer on  any further interventional therapy at this time.  5. Degeneration of intervertebral disc of lumbar region with discogenic back pain As above  6. Chronic, continuous use of opioids As above  7. Morbid obesity (HCC)   8. Pain in joint of left shoulder  9. Facet arthritis of lumbosacral region   10. Cervicogenic headache Continue follow-up with her primary care physicians for baseline medical care with scheduled return to clinic in 2 months    ----------------------------------------------------------------------------------------------------------------------  I am having Tina HERO. Behrmann maintain her tiotropium, traZODone , albuterol , albuterol , B-D UF III MINI PEN NEEDLES, acetaminophen , Dupixent, sertraline , amitriptyline, montelukast , atorvastatin , nitroGLYCERIN , amLODipine , insulin  aspart, Iron , Lantus  SoloStar, naloxone , losartan , clopidogrel , pantoprazole , divalproex , levETIRAcetam , sucralfate , hydrALAZINE , carvedilol , HYDROcodone -acetaminophen , and HYDROcodone -acetaminophen .   Meds ordered this encounter  Medications   HYDROcodone -acetaminophen  (NORCO) 10-325 MG tablet    Sig: Take 1 tablet by mouth every 6 (six) hours as needed for moderate pain (pain score 4-6) or severe pain (pain score 7-10).    Dispense:  120 tablet    Refill:  0   DISCONTD: HYDROcodone -acetaminophen  (NORCO) 10-325 MG tablet    Sig: Take 1 tablet by mouth every 6 (six) hours as needed for moderate pain (pain score 4-6) or severe pain (pain score 7-10).    Dispense:  90 tablet    Refill:  0   HYDROcodone -acetaminophen  (NORCO) 10-325 MG tablet    Sig: Take 1 tablet by mouth every 6 (six) hours as needed for moderate pain (pain score 4-6) or severe pain (pain score 7-10).    Dispense:  120 tablet    Refill:  0   Patient's Medications  New Prescriptions   HYDROCODONE -ACETAMINOPHEN  (NORCO) 10-325 MG TABLET    Take 1 tablet by mouth every 6 (six) hours as needed for moderate pain (pain score 4-6) or  severe pain (pain score 7-10).  Previous Medications   ACETAMINOPHEN  (TYLENOL ) 500 MG TABLET    Take 500 mg by mouth every 6 (six) hours as needed for headache, moderate pain or fever.   ALBUTEROL  (PROVENTIL  HFA;VENTOLIN  HFA) 108 (90 BASE) MCG/ACT INHALER    Inhale 2 puffs into the lungs every 6 (six) hours as needed for wheezing or shortness of breath.   ALBUTEROL  (PROVENTIL ) (2.5 MG/3ML) 0.083% NEBULIZER SOLUTION    Take 2.5 mg every 4 (four) hours as needed by nebulization for wheezing or shortness of breath.   AMITRIPTYLINE (ELAVIL) 25 MG TABLET    Take 25 mg by mouth at bedtime.   AMLODIPINE  (NORVASC ) 5 MG TABLET    Take 1 tablet by mouth once daily   ATORVASTATIN  (LIPITOR) 40 MG TABLET    Take 1 tablet (40 mg total) by mouth daily.   B-D UF III MINI PEN NEEDLES 31G X 5 MM MISC    USE WITH INSULIN  PEN INJECTIONS TWICE DAILY   CARVEDILOL  (COREG ) 3.125 MG TABLET    TAKE 1 TABLET BY MOUTH TWICE DAILY WITH MEALS   CLOPIDOGREL  (PLAVIX ) 75 MG TABLET    Take 75 mg by mouth daily.   DIVALPROEX  (DEPAKOTE ) 250 MG DR TABLET    Take 1 tablet (250 mg total) by mouth 2 (two) times daily.   DUPIXENT 300 MG/2ML SOPN    Inject into the skin.   FERROUS SULFATE  (IRON ) 325 (65 FE) MG TABS    Take 1 tablet by mouth daily.   HYDRALAZINE  (APRESOLINE ) 50 MG TABLET    Take 1 tablet (50 mg total) by mouth 3 (three) times daily as needed (Systolic BP greater than 150).   INSULIN  ASPART (NOVOLOG ) 100 UNIT/ML FLEXPEN    Inject 8 units subQ if blood sugar is consistently over 300 for more than 6 hours despite taking your other prescribed insulin . Repeat up to three times a day (every 8 hours) until blood  sugars improve.   LANTUS  SOLOSTAR 100 UNIT/ML SOLOSTAR PEN    Inject 10 Units into the skin See admin instructions. Inject 30 units subcutaneously in the morning at 35 units subcutaneously at bedtime.   LEVETIRACETAM  (KEPPRA ) 750 MG TABLET    Take 2 tablets (1,500 mg total) by mouth 2 (two) times daily.   LOSARTAN  (COZAAR )  100 MG TABLET    Take 1 tablet by mouth once daily   MONTELUKAST  (SINGULAIR ) 10 MG TABLET    Take 10 mg by mouth daily.   NALOXONE  (NARCAN ) NASAL SPRAY 4 MG/0.1 ML    As directed for opioid induced respiratory depression   NITROGLYCERIN  (NITROSTAT ) 0.4 MG SL TABLET    DISSOLVE ONE TABLET UNDER THE TONGUE EVERY 5 MINUTES AS NEEDED FOR CHEST PAIN.  DO NOT EXCEED A TOTAL OF 3 DOSES IN 15 MINUTES   PANTOPRAZOLE  (PROTONIX ) 40 MG TABLET    Take 40 mg by mouth 2 (two) times daily.   SERTRALINE  (ZOLOFT ) 100 MG TABLET    Take 150 mg by mouth daily.   SUCRALFATE  (CARAFATE ) 1 GM/10ML SUSPENSION    Take 10 mLs (1 g total) by mouth 2 (two) times daily.   TIOTROPIUM (SPIRIVA ) 18 MCG INHALATION CAPSULE    Place 18 mcg into inhaler and inhale daily.   TRAZODONE  (DESYREL ) 50 MG TABLET    Take 50 mg by mouth at bedtime.  Modified Medications   Modified Medication Previous Medication   HYDROCODONE -ACETAMINOPHEN  (NORCO) 10-325 MG TABLET HYDROcodone -acetaminophen  (NORCO) 10-325 MG tablet      Take 1 tablet by mouth every 6 (six) hours as needed for moderate pain (pain score 4-6) or severe pain (pain score 7-10).    Take 1 tablet by mouth every 6 (six) hours as needed for moderate pain (pain score 4-6) or severe pain (pain score 7-10).  Discontinued Medications   No medications on file   ----------------------------------------------------------------------------------------------------------------------  Follow-up: No follow-ups on file.    Lynwood KANDICE Clause, MD

## 2024-04-04 ENCOUNTER — Other Ambulatory Visit: Payer: Self-pay | Admitting: Cardiovascular Disease

## 2024-04-04 ENCOUNTER — Telehealth: Payer: Self-pay

## 2024-04-04 NOTE — Telephone Encounter (Signed)
 Patient states the pharmacy only gave her 90 pills instead of the usual 120 and she wants to know why

## 2024-04-04 NOTE — Telephone Encounter (Signed)
 Called to patient pharmacy, we deducted that they filled the Rx that had qty 90 instead of qty 120.  Voicemail left reporting that patient does have another Rx that was d/t pick up on yesteday, however the qty 90 was filled from a different Rx.  I am supposing that this was an error in qty.  I have asked patient to remind us  so that we can go over checks and balances and things to check when receiving Rx and picking up medications.

## 2024-04-05 NOTE — Telephone Encounter (Signed)
Please contact pt for future appointment. 

## 2024-04-21 ENCOUNTER — Emergency Department
Admission: EM | Admit: 2024-04-21 | Discharge: 2024-04-21 | Disposition: A | Discharge status: Home / Self Care | Attending: Emergency Medicine | Admitting: Emergency Medicine

## 2024-04-21 ENCOUNTER — Emergency Department

## 2024-04-21 ENCOUNTER — Other Ambulatory Visit: Payer: Self-pay

## 2024-04-21 DIAGNOSIS — I5031 Acute diastolic (congestive) heart failure: Secondary | ICD-10-CM | POA: Insufficient documentation

## 2024-04-21 DIAGNOSIS — R109 Unspecified abdominal pain: Secondary | ICD-10-CM | POA: Insufficient documentation

## 2024-04-21 DIAGNOSIS — J069 Acute upper respiratory infection, unspecified: Secondary | ICD-10-CM | POA: Diagnosis not present

## 2024-04-21 DIAGNOSIS — I2510 Atherosclerotic heart disease of native coronary artery without angina pectoris: Secondary | ICD-10-CM | POA: Insufficient documentation

## 2024-04-21 DIAGNOSIS — J4521 Mild intermittent asthma with (acute) exacerbation: Secondary | ICD-10-CM | POA: Diagnosis not present

## 2024-04-21 DIAGNOSIS — K219 Gastro-esophageal reflux disease without esophagitis: Secondary | ICD-10-CM | POA: Insufficient documentation

## 2024-04-21 DIAGNOSIS — M25571 Pain in right ankle and joints of right foot: Secondary | ICD-10-CM | POA: Diagnosis not present

## 2024-04-21 DIAGNOSIS — Z96652 Presence of left artificial knee joint: Secondary | ICD-10-CM | POA: Insufficient documentation

## 2024-04-21 DIAGNOSIS — I110 Hypertensive heart disease with heart failure: Secondary | ICD-10-CM | POA: Insufficient documentation

## 2024-04-21 DIAGNOSIS — E119 Type 2 diabetes mellitus without complications: Secondary | ICD-10-CM | POA: Insufficient documentation

## 2024-04-21 DIAGNOSIS — J449 Chronic obstructive pulmonary disease, unspecified: Secondary | ICD-10-CM | POA: Diagnosis not present

## 2024-04-21 DIAGNOSIS — R059 Cough, unspecified: Secondary | ICD-10-CM | POA: Diagnosis present

## 2024-04-21 LAB — CBC
HCT: 35.1 % — ABNORMAL LOW (ref 36.0–46.0)
Hemoglobin: 10.6 g/dL — ABNORMAL LOW (ref 12.0–15.0)
MCH: 25.5 pg — ABNORMAL LOW (ref 26.0–34.0)
MCHC: 30.2 g/dL (ref 30.0–36.0)
MCV: 84.4 fL (ref 80.0–100.0)
Platelets: 236 K/uL (ref 150–400)
RBC: 4.16 MIL/uL (ref 3.87–5.11)
RDW: 15.7 % — ABNORMAL HIGH (ref 11.5–15.5)
WBC: 3.6 K/uL — ABNORMAL LOW (ref 4.0–10.5)
nRBC: 0 % (ref 0.0–0.2)

## 2024-04-21 LAB — BASIC METABOLIC PANEL WITH GFR
Anion gap: 14 (ref 5–15)
BUN: 8 mg/dL (ref 8–23)
CO2: 24 mmol/L (ref 22–32)
Calcium: 9 mg/dL (ref 8.9–10.3)
Chloride: 104 mmol/L (ref 98–111)
Creatinine, Ser: 0.71 mg/dL (ref 0.44–1.00)
GFR, Estimated: >60 mL/min
Glucose, Bld: 124 mg/dL — ABNORMAL HIGH (ref 70–99)
Potassium: 3.5 mmol/L (ref 3.5–5.1)
Sodium: 141 mmol/L (ref 135–145)

## 2024-04-21 LAB — RESP PANEL BY RT-PCR (RSV, FLU A&B, COVID)  RVPGX2
Influenza A by PCR: NEGATIVE
Influenza B by PCR: NEGATIVE
Resp Syncytial Virus by PCR: NEGATIVE
SARS Coronavirus 2 by RT PCR: NEGATIVE

## 2024-04-21 LAB — GROUP A STREP BY PCR: Group A Strep by PCR: NOT DETECTED

## 2024-04-21 LAB — PRO BRAIN NATRIURETIC PEPTIDE: Pro Brain Natriuretic Peptide: 565 pg/mL — ABNORMAL HIGH

## 2024-04-21 LAB — TROPONIN T, HIGH SENSITIVITY: Troponin T High Sensitivity: <15 ng/L (ref 0–19)

## 2024-04-21 MED ORDER — IPRATROPIUM-ALBUTEROL 0.5-2.5 (3) MG/3ML IN SOLN
3.0000 mL | Freq: Once | RESPIRATORY_TRACT | Status: AC
  Administered 2024-04-21: 3 mL via RESPIRATORY_TRACT
  Filled 2024-04-21: qty 3

## 2024-04-21 MED ORDER — ACETAMINOPHEN 500 MG PO TABS
500.0000 mg | ORAL_TABLET | Freq: Once | ORAL | Status: AC
  Administered 2024-04-21: 500 mg via ORAL
  Filled 2024-04-21: qty 1

## 2024-04-21 MED ORDER — FUROSEMIDE 40 MG PO TABS
20.0000 mg | ORAL_TABLET | Freq: Once | ORAL | Status: AC
  Administered 2024-04-21: 20 mg via ORAL
  Filled 2024-04-21: qty 1

## 2024-04-21 MED ORDER — HYDROCODONE-ACETAMINOPHEN 5-325 MG PO TABS
1.0000 | ORAL_TABLET | Freq: Once | ORAL | Status: AC
  Administered 2024-04-21: 1 via ORAL
  Filled 2024-04-21: qty 1

## 2024-04-21 MED ORDER — PREDNISONE 50 MG PO TABS: ORAL_TABLET | ORAL | 0 refills | Status: AC

## 2024-04-21 MED ORDER — ALBUTEROL SULFATE HFA 108 (90 BASE) MCG/ACT IN AERS: 2.0000 | INHALATION_SPRAY | Freq: Four times a day (QID) | RESPIRATORY_TRACT | 2 refills | Status: AC | PRN

## 2024-04-21 MED ORDER — PREDNISONE 20 MG PO TABS
60.0000 mg | ORAL_TABLET | Freq: Once | ORAL | Status: AC
  Administered 2024-04-21: 60 mg via ORAL
  Filled 2024-04-21: qty 3

## 2024-04-21 NOTE — Discharge Instructions (Addendum)
 You have been diagnosed with viral upper respiratory infection, asthma exacerbation.  Please drink plenty of fluids.  Please take prednisone  1 tablet by mouth with breakfast.  Please use albuterol  inhaler 2 puffs into the lungs every 6 hours as needed for wheezing.  Please call heart failure clinic and make an appointment for a follow-up in the next 2 days.  Please come back to ED or go to your PCP if you have new symptoms or symptoms worsen.  It was a pleasure to help you today.  Kalai Baca, PA-C.

## 2024-04-21 NOTE — ED Triage Notes (Signed)
 First nurse note: pt to ED [facility] from home for flu sx x2 weeks. Shob, chest pressure. 100% RA.

## 2024-04-21 NOTE — ED Triage Notes (Signed)
 Pt to ED for cough, sore throat, nasal congestion, chest pain, SOB, body aches for last 2 weeks. States green sputum at night. States painful to swallow. States SOB worse when lying flat. Respirations are unlabored but does appear congested. Skin is dry.

## 2024-04-21 NOTE — ED Provider Notes (Signed)
 "  [facility] Provider Note    Event Date/Time   First MD Initiated Contact with Patient 04/21/24 1615     (approximate)   History   Cough, Sore Throat, and Shortness of Breath    HPI  Tina Hayes is a 78 y.o. female    with a past medical history of bilateral sciatica, seizures, eosinophilic asthma, EGD, congestive heart failure, OSA, arthritis, who presents to the ED complaining of sore throat, nasal congestion. According to the patient, symptoms started 3 days ago, with dry cough, sore throat, nasal congestion, liquid diarrhea without blood in the stools, chest pain that increase with cough, abdominal pain that increased with cough.  Patient endorses having right ankle pain.  Patient denies urinary symptoms.  Patient here by herself.    Patient Active Problem List   Diagnosis Date Noted   Choking 01/04/2023   Upper GI bleed 01/02/2023   (HFpEF) heart failure with preserved ejection fraction (HCC) 01/02/2023   Other chest pain 04/11/2022   Generalized weakness 04/10/2022   Acute lower UTI 04/10/2022   Chest pain 04/10/2022   Dyslipidemia 04/10/2022   Chronic obstructive pulmonary disease (COPD) (HCC) 04/10/2022   Type 2 diabetes mellitus without complications (HCC) 04/10/2022   GERD without esophagitis 04/10/2022   Headache 08/09/2021   Melena    Coffee ground emesis    Paraesophageal hernia    Cameron ulcer, acute    Acute upper GI bleed 02/24/2021   Abnormal EKG 02/24/2021   Acute on chronic anemia 02/24/2021   Status post total knee replacement using cement, 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   Atherosclerosis of native arteries of the extremities with ulceration (HCC) 07/05/2019   Onychomycosis 05/30/2019   GERD (gastroesophageal reflux disease) 12/14/2018   Presbyesophagus 12/14/2018   Occlusion and stenosis of vertebral artery 08/31/2018   AKI (acute kidney injury) 05/25/2018   BMI 50.0-59.9, adult (HCC) 04/16/2018   Seizures (HCC) 11/10/2017   Mass of  right lower leg 09/11/2017   Dysphagia 04/23/2017   Chronic diastolic congestive heart failure (HCC) 02/13/2017   Acute diastolic heart failure (HCC) 01/27/2017   Depression 12/11/2016   Essential hypertension 06/30/2016   Snoring 06/30/2016   Diabetes mellitus (HCC) 06/30/2016   Coronary artery disease 06/17/2016   Morbid obesity (HCC) 06/17/2016   Angina pectoris 06/16/2016   COPD (chronic obstructive pulmonary disease) (HCC) 08/28/2015   COPD with acute exacerbation (HCC) 04/14/2015     Physical Exam   Triage Vital Signs: ED Triage Vitals [04/21/24 1446]  Encounter Vitals Group     BP (!) 145/78     Girls Systolic BP Percentile      Girls Diastolic BP Percentile      Boys Systolic BP Percentile      Boys Diastolic BP Percentile      Pulse Rate 99     Resp 20     Temp 97.7 F (36.5 C)     Temp Source Oral     SpO2 94 %     Weight      Height      Head Circumference      Peak Flow      Pain Score      Pain Loc      Pain Education      Exclude from Growth Chart     Most recent vital signs: Vitals:   04/21/24 1620 04/21/24 2121  BP:  (!) 160/64  Pulse:  (!) 58  Resp:  20  Temp:  98.5 F (36.9 C)  SpO2: 96% 98%     Physical Exam Vitals and nursing note reviewed.  In triage patient was hypertensive  General:          Awake, no distress.  No active cough during physical exam.  Patient is dysphonic. Face: Presence of periorbital edema. Throat: No erythema, no tonsillar enlargement. CV:                  Good peripheral perfusion. Regular rate and rhythm. Resp:               Normal effort. no tachypnea.Equal breath sounds bilaterally.  No wheezing Abd:              Bowel sounds positive, no ecchymosis or hematomas.   No distention.  Soft, tender to palpation in left flank and left lumbar muscles.  No signs of peritoneal irritation. Other:    Anterior chest: Tenderness to palpation at the level of the 4th and 5th rib at the joint with the sternum.     Lower  extremities: Presence of edema grade 2.  Tenderness to palpation in right ankle.  Right ankle with a scar from surgery.       ED Results / Procedures / Treatments   Labs (all labs ordered are listed, but only abnormal results are displayed) Labs Reviewed  BASIC METABOLIC PANEL WITH GFR - Abnormal; Notable for the following components:      Result Value   Glucose, Bld 124 (*)    All other components within normal limits  CBC - Abnormal; Notable for the following components:   WBC 3.6 (*)    Hemoglobin 10.6 (*)    HCT 35.1 (*)    MCH 25.5 (*)    RDW 15.7 (*)    All other components within normal limits  PRO BRAIN NATRIURETIC PEPTIDE - Abnormal; Notable for the following components:   Pro Brain Natriuretic Peptide 565.0 (*)    All other components within normal limits  RESP PANEL BY RT-PCR (RSV, FLU A&B, COVID)  [id number]  GROUP A STREP BY PCR  TROPONIN T, HIGH SENSITIVITY     EKG See physician read    RADIOLOGY I independently reviewed and interpreted imaging and agree with radiologists findings.      PROCEDURES:  Critical Care performed:   Procedures   MEDICATIONS ORDERED IN ED: Medications  HYDROcodone -acetaminophen  (NORCO/VICODIN) 5-325 MG per tablet 1 tablet (1 tablet Oral Given 04/21/24 1758)  furosemide  (LASIX ) tablet 20 mg (20 mg Oral Given 04/21/24 1758)  ipratropium-albuterol  (DUONEB) 0.5-2.5 (3) MG/3ML nebulizer solution 3 mL (3 mLs Nebulization Given 04/21/24 2117)  predniSONE  (DELTASONE ) tablet 60 mg (60 mg Oral Given 04/21/24 2117)  acetaminophen  (TYLENOL ) tablet 500 mg (500 mg Oral Given 04/21/24 2117)   Clinical Course as of 04/21/24 2150  Thu Apr 21, 2024  1713 Basic metabolic panel(!) Electrolytes, renal function and anion gap within normal limits. [AE]  1714 Troponin T, High Sensitivity Within normal limits [AE]  1714 CBC(!) Leukopenia, white blood cells 3.6.  Anemia, hemoglobin 10.6.  Platelets within normal limits. [AE]  1714 Pro Brain  natriuretic peptide(!) BNP increase 565. [AE]  1714 Resp panel by RT-PCR (RSV, Flu A&B, Covid) Anterior Nasal Swab Negative [AE]  1714 Group A Strep by PCR ([facility] Only) Negative [AE]  1715 DG Chest 2 View No active cardiopulmonary disease. Moderate hiatal hernia. [AE]  2014 Reassessed the patient   Patient continues with expiratory wheezing.  I will order DuoNeb  treatment.  Patient endorses having headache.  I will order acetaminophen . [AE]  2140 Reassessed the patient after bringing her dinner.  Patient endorses resolution of the headache.  Patient is ready for discharge with prescription of prednisone  and albuterol  inhaler.  Patient is agreeable with the plan.  I will refer patient for heart failure clinic for a follow-up. [AE]    Clinical Course User Index [AE] Janit Kast, PA-C    IMPRESSION / MDM / ASSESSMENT AND PLAN / ED COURSE  I reviewed the triage vital signs and the nursing notes.  Differential diagnosis includes, but is not limited to, decompensated heart failure, costochondritis, viral infection, unlikely, muscular pain due to cough, pneumonia, COPD exacerbation, pneumothorax, MI, pericardial effusion  Patient's presentation is most consistent with acute complicated illness / injury requiring diagnostic workup.   Tina Hayes is a 78 y.o., female who presents today with history of 3 days of cough, sore throat, chest pain that increased with cough, abdominal pain that increased with cough.  See HPI for further information.  On a physical exam patient has systolic hypertension during triage.  Saturations 94.  No tachypnea.  Patient is dysphonic, no active coughing during physical exam.  Face presence of periorbital edema, throat within normal limits, chest is tender to palpation at the level of the 4th and 5th rib at the costochondral joint.  Cardiopulmonary no no presence of S3-S4, no wheezing, crackles or rales.  Abdomen, bowel sounds positive, tenderness to palpation to  deep palpation in the left flank and extends to the left lumbar muscles.  Presence of edema in extremities grade 1.  Tender to palpation in the right ankle.  No tenderness to palpation of the calf.  Pedal edema. Plan Norco Lasix  20 mg PO Reassess Troponins, chest x-ray came back within normal limits ruling out MI, pneumonia. Respiratory panel and strep a negative.  BMP within normal limits, CBC show anemia, hemoglobin 10.6.  BNP 565. Reassessed the patient after getting DuoNeb treatment, prednisolone, dinner.  Patient endorses resolution of the headache, patient states she was unable to eat food in the last 3 days due to wheezing. Patient's diagnosis is consistent with asthma exacerbation, viral upper respiratory infection. I independently reviewed and interpreted imaging and agree with radiologists findings. Labs are  reassuring. I did review the patient's allergies and medications.The patient is in stable and satisfactory condition for discharge home  Patient will be discharged home with prescriptions for prednisone , albuterol  inhaler. Patient is to follow up with heart failure clinic in 3 days for a follow as needed or otherwise directed. Patient is given ED precautions to return to the ED for any worsening or new symptoms. Discussed plan of care with patient, answered all of patient's questions, patient agreeable to plan of care. Advised patient to take medications according to the instructions on the label. Discussed possible side effects of new medications. Patient verbalized understanding.  FINAL CLINICAL IMPRESSION(S) / ED DIAGNOSES   Final diagnoses:  Upper respiratory tract infection, unspecified type  Mild intermittent asthma with exacerbation     Rx / DC Orders   ED Discharge Orders          Ordered    albuterol  (VENTOLIN  HFA) 108 (90 Base) MCG/ACT inhaler  Every 6 hours PRN        04/21/24 2149    predniSONE  (DELTASONE ) 50 MG tablet        04/21/24 2149              Note:  This document was prepared using Dragon voice recognition software and may include unintentional dictation errors.   Janit Kast, PA-C 04/21/24 2150  "

## 2024-04-25 ENCOUNTER — Other Ambulatory Visit: Payer: Self-pay

## 2024-04-26 ENCOUNTER — Other Ambulatory Visit: Payer: Self-pay | Admitting: Cardiovascular Disease

## 2024-04-26 MED ORDER — LOSARTAN POTASSIUM 100 MG PO TABS: 100.0000 mg | ORAL_TABLET | Freq: Every day | ORAL | 0 refills | Status: DC

## 2024-04-26 MED ORDER — CARVEDILOL 3.125 MG PO TABS: 3.1250 mg | ORAL_TABLET | Freq: Two times a day (BID) | ORAL | 1 refills | Status: AC

## 2024-05-18 ENCOUNTER — Other Ambulatory Visit: Payer: Self-pay | Admitting: Cardiovascular Disease

## 2024-05-27 ENCOUNTER — Telehealth: Payer: Self-pay | Admitting: Anesthesiology

## 2024-05-27 NOTE — Telephone Encounter (Signed)
Pt needs a refill on her hydrocodone.  

## 2024-05-29 NOTE — Telephone Encounter (Signed)
 Patient has an appointment on Monday (Tomorrow)

## 2024-05-30 ENCOUNTER — Ambulatory Visit: Admitting: Anesthesiology

## 2024-05-30 ENCOUNTER — Encounter: Payer: Self-pay | Admitting: Anesthesiology

## 2024-05-30 DIAGNOSIS — G4486 Cervicogenic headache: Secondary | ICD-10-CM

## 2024-05-30 DIAGNOSIS — F1190 Opioid use, unspecified, uncomplicated: Secondary | ICD-10-CM

## 2024-05-30 DIAGNOSIS — M48062 Spinal stenosis, lumbar region with neurogenic claudication: Secondary | ICD-10-CM

## 2024-05-30 DIAGNOSIS — G894 Chronic pain syndrome: Secondary | ICD-10-CM

## 2024-05-30 DIAGNOSIS — M4716 Other spondylosis with myelopathy, lumbar region: Secondary | ICD-10-CM

## 2024-05-30 DIAGNOSIS — M5431 Sciatica, right side: Secondary | ICD-10-CM

## 2024-05-30 DIAGNOSIS — M5432 Sciatica, left side: Secondary | ICD-10-CM

## 2024-05-30 DIAGNOSIS — M47817 Spondylosis without myelopathy or radiculopathy, lumbosacral region: Secondary | ICD-10-CM

## 2024-05-30 DIAGNOSIS — M25512 Pain in left shoulder: Secondary | ICD-10-CM

## 2024-05-30 MED ORDER — HYDROCODONE-ACETAMINOPHEN 10-325 MG PO TABS: 1.0000 | ORAL_TABLET | Freq: Four times a day (QID) | ORAL | 0 refills | Status: AC | PRN

## 2024-05-30 NOTE — Progress Notes (Signed)
 I was unable to link with Tina Hayes for her virtual appointment.  Multiple calls were made and a voice message left.  We will refill her medication for this next month as she is overdue with a plan to have her scheduled for a new appointment here within the next few weeks.
